# Patient Record
Sex: Female | Born: 1950 | Race: White | Hispanic: No | Marital: Married | Smoking: Never smoker
Health system: Southern US, Community
[De-identification: ages and names within clinical notes are randomized; demographics above are authoritative.]

## PROBLEM LIST (undated history)

## (undated) DIAGNOSIS — R739 Hyperglycemia, unspecified: Secondary | ICD-10-CM

## (undated) DIAGNOSIS — G20 Parkinson's disease: Secondary | ICD-10-CM

## (undated) DIAGNOSIS — L719 Rosacea, unspecified: Secondary | ICD-10-CM

## (undated) DIAGNOSIS — R87629 Unspecified abnormal cytological findings in specimens from vagina: Secondary | ICD-10-CM

## (undated) DIAGNOSIS — M79671 Pain in right foot: Secondary | ICD-10-CM

## (undated) DIAGNOSIS — E785 Hyperlipidemia, unspecified: Secondary | ICD-10-CM

## (undated) DIAGNOSIS — R1310 Dysphagia, unspecified: Secondary | ICD-10-CM

## (undated) DIAGNOSIS — R350 Frequency of micturition: Secondary | ICD-10-CM

## (undated) DIAGNOSIS — F418 Other specified anxiety disorders: Secondary | ICD-10-CM

## (undated) DIAGNOSIS — E669 Obesity, unspecified: Secondary | ICD-10-CM

## (undated) DIAGNOSIS — R61 Generalized hyperhidrosis: Secondary | ICD-10-CM

## (undated) DIAGNOSIS — K449 Diaphragmatic hernia without obstruction or gangrene: Secondary | ICD-10-CM

## (undated) DIAGNOSIS — H43399 Other vitreous opacities, unspecified eye: Secondary | ICD-10-CM

## (undated) DIAGNOSIS — M722 Plantar fascial fibromatosis: Secondary | ICD-10-CM

## (undated) DIAGNOSIS — M069 Rheumatoid arthritis, unspecified: Secondary | ICD-10-CM

## (undated) DIAGNOSIS — B059 Measles without complication: Secondary | ICD-10-CM

## (undated) DIAGNOSIS — N979 Female infertility, unspecified: Secondary | ICD-10-CM

## (undated) DIAGNOSIS — D649 Anemia, unspecified: Secondary | ICD-10-CM

## (undated) DIAGNOSIS — M545 Low back pain: Secondary | ICD-10-CM

## (undated) DIAGNOSIS — K222 Esophageal obstruction: Secondary | ICD-10-CM

## (undated) DIAGNOSIS — B019 Varicella without complication: Secondary | ICD-10-CM

## (undated) DIAGNOSIS — G20A1 Parkinson's disease without dyskinesia, without mention of fluctuations: Secondary | ICD-10-CM

## (undated) DIAGNOSIS — K5900 Constipation, unspecified: Secondary | ICD-10-CM

## (undated) DIAGNOSIS — M1990 Unspecified osteoarthritis, unspecified site: Secondary | ICD-10-CM

## (undated) DIAGNOSIS — F419 Anxiety disorder, unspecified: Secondary | ICD-10-CM

## (undated) DIAGNOSIS — L309 Dermatitis, unspecified: Secondary | ICD-10-CM

## (undated) DIAGNOSIS — K219 Gastro-esophageal reflux disease without esophagitis: Secondary | ICD-10-CM

## (undated) DIAGNOSIS — Z0000 Encounter for general adult medical examination without abnormal findings: Secondary | ICD-10-CM

## (undated) DIAGNOSIS — N390 Urinary tract infection, site not specified: Secondary | ICD-10-CM

## (undated) DIAGNOSIS — M79672 Pain in left foot: Secondary | ICD-10-CM

## (undated) DIAGNOSIS — C4491 Basal cell carcinoma of skin, unspecified: Secondary | ICD-10-CM

## (undated) DIAGNOSIS — T7840XA Allergy, unspecified, initial encounter: Secondary | ICD-10-CM

## (undated) DIAGNOSIS — I10 Essential (primary) hypertension: Secondary | ICD-10-CM

## (undated) DIAGNOSIS — D219 Benign neoplasm of connective and other soft tissue, unspecified: Secondary | ICD-10-CM

## (undated) DIAGNOSIS — J029 Acute pharyngitis, unspecified: Secondary | ICD-10-CM

## (undated) HISTORY — DX: Dermatitis, unspecified: L30.9

## (undated) HISTORY — DX: Basal cell carcinoma of skin, unspecified: C44.91

## (undated) HISTORY — DX: Pain in left foot: M79.672

## (undated) HISTORY — DX: Unspecified osteoarthritis, unspecified site: M19.90

## (undated) HISTORY — DX: Measles without complication: B05.9

## (undated) HISTORY — DX: Rheumatoid arthritis, unspecified: M06.9

## (undated) HISTORY — DX: Frequency of micturition: R35.0

## (undated) HISTORY — DX: Urinary tract infection, site not specified: N39.0

## (undated) HISTORY — DX: Anxiety disorder, unspecified: F41.9

## (undated) HISTORY — DX: Encounter for general adult medical examination without abnormal findings: Z00.00

## (undated) HISTORY — DX: Pain in right foot: M79.671

## (undated) HISTORY — PX: OTHER SURGICAL HISTORY: SHX169

## (undated) HISTORY — DX: Dysphagia, unspecified: R13.10

## (undated) HISTORY — DX: Gastro-esophageal reflux disease without esophagitis: K21.9

## (undated) HISTORY — DX: Varicella without complication: B01.9

## (undated) HISTORY — DX: Parkinson's disease: G20

## (undated) HISTORY — PX: HERNIA REPAIR: SHX51

## (undated) HISTORY — DX: Anemia, unspecified: D64.9

## (undated) HISTORY — DX: Unspecified abnormal cytological findings in specimens from vagina: R87.629

## (undated) HISTORY — DX: Other specified anxiety disorders: F41.8

## (undated) HISTORY — DX: Allergy, unspecified, initial encounter: T78.40XA

## (undated) HISTORY — PX: CARDIAC CATHETERIZATION: SHX172

## (undated) HISTORY — DX: Diaphragmatic hernia without obstruction or gangrene: K44.9

## (undated) HISTORY — DX: Low back pain: M54.5

## (undated) HISTORY — DX: Generalized hyperhidrosis: R61

## (undated) HISTORY — PX: JOINT REPLACEMENT: SHX530

## (undated) HISTORY — DX: Benign neoplasm of connective and other soft tissue, unspecified: D21.9

## (undated) HISTORY — DX: Rosacea, unspecified: L71.9

## (undated) HISTORY — DX: Constipation, unspecified: K59.00

## (undated) HISTORY — DX: Hyperglycemia, unspecified: R73.9

## (undated) HISTORY — DX: Acute pharyngitis, unspecified: J02.9

## (undated) HISTORY — DX: Parkinson's disease without dyskinesia, without mention of fluctuations: G20.A1

## (undated) HISTORY — DX: Other vitreous opacities, unspecified eye: H43.399

## (undated) HISTORY — DX: Essential (primary) hypertension: I10

## (undated) HISTORY — DX: Obesity, unspecified: E66.9

## (undated) HISTORY — PX: UPPER GASTROINTESTINAL ENDOSCOPY: SHX188

## (undated) HISTORY — DX: Plantar fascial fibromatosis: M72.2

## (undated) HISTORY — DX: Female infertility, unspecified: N97.9

## (undated) HISTORY — PX: TONSILLECTOMY: SUR1361

## (undated) HISTORY — DX: Hyperlipidemia, unspecified: E78.5

## (undated) HISTORY — DX: Esophageal obstruction: K22.2

---

## 1992-09-04 DIAGNOSIS — K222 Esophageal obstruction: Secondary | ICD-10-CM

## 1992-09-04 HISTORY — DX: Esophageal obstruction: K22.2

## 1993-06-07 ENCOUNTER — Encounter: Payer: Self-pay | Admitting: Gastroenterology

## 1999-09-05 LAB — HM COLONOSCOPY

## 2003-06-01 ENCOUNTER — Other Ambulatory Visit: Admission: RE | Admit: 2003-06-01 | Discharge: 2003-06-01 | Payer: Self-pay | Admitting: Obstetrics and Gynecology

## 2003-06-10 ENCOUNTER — Encounter: Admission: RE | Admit: 2003-06-10 | Discharge: 2003-06-10 | Payer: Self-pay | Admitting: Obstetrics and Gynecology

## 2003-06-10 ENCOUNTER — Encounter: Payer: Self-pay | Admitting: Obstetrics and Gynecology

## 2004-07-08 ENCOUNTER — Encounter: Admission: RE | Admit: 2004-07-08 | Discharge: 2004-07-08 | Payer: Self-pay | Admitting: Obstetrics and Gynecology

## 2004-07-14 ENCOUNTER — Ambulatory Visit: Payer: Self-pay | Admitting: Internal Medicine

## 2004-07-19 ENCOUNTER — Encounter: Admission: RE | Admit: 2004-07-19 | Discharge: 2004-07-19 | Payer: Self-pay | Admitting: Obstetrics and Gynecology

## 2004-08-18 ENCOUNTER — Ambulatory Visit: Payer: Self-pay | Admitting: Internal Medicine

## 2004-09-04 HISTORY — PX: ABDOMINAL HYSTERECTOMY: SHX81

## 2004-10-11 ENCOUNTER — Ambulatory Visit: Payer: Self-pay | Admitting: Internal Medicine

## 2004-11-08 ENCOUNTER — Ambulatory Visit: Payer: Self-pay | Admitting: Internal Medicine

## 2004-12-15 ENCOUNTER — Observation Stay (HOSPITAL_COMMUNITY): Admission: AD | Admit: 2004-12-15 | Discharge: 2004-12-16 | Payer: Self-pay | Admitting: Obstetrics and Gynecology

## 2004-12-15 ENCOUNTER — Encounter (INDEPENDENT_AMBULATORY_CARE_PROVIDER_SITE_OTHER): Payer: Self-pay | Admitting: *Deleted

## 2005-01-10 ENCOUNTER — Ambulatory Visit: Payer: Self-pay | Admitting: Internal Medicine

## 2005-03-09 ENCOUNTER — Ambulatory Visit: Payer: Self-pay | Admitting: Internal Medicine

## 2005-04-04 ENCOUNTER — Ambulatory Visit: Payer: Self-pay | Admitting: Internal Medicine

## 2005-04-10 ENCOUNTER — Ambulatory Visit: Payer: Self-pay | Admitting: Internal Medicine

## 2005-05-30 ENCOUNTER — Ambulatory Visit: Payer: Self-pay | Admitting: Internal Medicine

## 2005-06-06 ENCOUNTER — Ambulatory Visit: Payer: Self-pay | Admitting: Internal Medicine

## 2005-08-16 ENCOUNTER — Encounter: Admission: RE | Admit: 2005-08-16 | Discharge: 2005-08-16 | Payer: Self-pay | Admitting: Obstetrics and Gynecology

## 2005-10-10 ENCOUNTER — Ambulatory Visit: Payer: Self-pay | Admitting: Internal Medicine

## 2005-11-17 ENCOUNTER — Ambulatory Visit: Payer: Self-pay | Admitting: Internal Medicine

## 2005-12-22 ENCOUNTER — Ambulatory Visit: Payer: Self-pay | Admitting: Internal Medicine

## 2006-04-02 ENCOUNTER — Ambulatory Visit: Payer: Self-pay | Admitting: Internal Medicine

## 2006-04-25 ENCOUNTER — Ambulatory Visit: Payer: Self-pay | Admitting: Internal Medicine

## 2006-05-28 ENCOUNTER — Ambulatory Visit: Payer: Self-pay | Admitting: Internal Medicine

## 2006-06-04 ENCOUNTER — Ambulatory Visit: Payer: Self-pay | Admitting: Internal Medicine

## 2006-06-12 ENCOUNTER — Ambulatory Visit: Payer: Self-pay

## 2006-08-03 ENCOUNTER — Ambulatory Visit: Payer: Self-pay | Admitting: Internal Medicine

## 2007-01-14 ENCOUNTER — Ambulatory Visit: Payer: Self-pay | Admitting: Internal Medicine

## 2007-03-06 ENCOUNTER — Encounter: Admission: RE | Admit: 2007-03-06 | Discharge: 2007-03-06 | Payer: Self-pay | Admitting: Obstetrics and Gynecology

## 2007-03-12 ENCOUNTER — Ambulatory Visit: Payer: Self-pay | Admitting: Internal Medicine

## 2007-03-12 LAB — CONVERTED CEMR LAB
ALT: 38 units/L — ABNORMAL HIGH (ref 0–35)
AST: 31 units/L (ref 0–37)
Albumin: 3.9 g/dL (ref 3.5–5.2)
Alkaline Phosphatase: 77 units/L (ref 39–117)
Bilirubin, Direct: 0.1 mg/dL (ref 0.0–0.3)
Cholesterol: 261 mg/dL (ref 0–200)
Direct LDL: 197.5 mg/dL
HDL: 53.5 mg/dL (ref >39.0)
Total Bilirubin: 0.9 mg/dL (ref 0.3–1.2)
Total CHOL/HDL Ratio: 4.9
Total Protein: 6.6 g/dL (ref 6.0–8.3)
Triglycerides: 124 mg/dL (ref 0–149)
VLDL: 25 mg/dL (ref 0–40)

## 2007-03-22 ENCOUNTER — Ambulatory Visit: Payer: Self-pay | Admitting: Internal Medicine

## 2007-05-08 DIAGNOSIS — K219 Gastro-esophageal reflux disease without esophagitis: Secondary | ICD-10-CM | POA: Insufficient documentation

## 2007-05-08 DIAGNOSIS — Z862 Personal history of diseases of the blood and blood-forming organs and certain disorders involving the immune mechanism: Secondary | ICD-10-CM | POA: Insufficient documentation

## 2007-06-19 ENCOUNTER — Telehealth: Payer: Self-pay | Admitting: Internal Medicine

## 2007-06-26 ENCOUNTER — Ambulatory Visit: Payer: Self-pay | Admitting: Internal Medicine

## 2007-06-26 DIAGNOSIS — E785 Hyperlipidemia, unspecified: Secondary | ICD-10-CM | POA: Insufficient documentation

## 2007-06-26 LAB — CONVERTED CEMR LAB
ALT: 32 units/L (ref 0–35)
AST: 28 units/L (ref 0–37)
Albumin: 3.8 g/dL (ref 3.5–5.2)
Alkaline Phosphatase: 76 units/L (ref 39–117)
Bilirubin, Direct: 0.2 mg/dL (ref 0.0–0.3)
Cholesterol: 182 mg/dL (ref 0–200)
HDL: 46.8 mg/dL (ref >39.0)
LDL Cholesterol: 113 mg/dL — ABNORMAL HIGH (ref 0–99)
Total Bilirubin: 0.8 mg/dL (ref 0.3–1.2)
Total CHOL/HDL Ratio: 3.9
Total Protein: 6.5 g/dL (ref 6.0–8.3)
Triglycerides: 109 mg/dL (ref 0–149)
VLDL: 22 mg/dL (ref 0–40)

## 2007-07-05 ENCOUNTER — Ambulatory Visit: Payer: Self-pay | Admitting: Internal Medicine

## 2007-07-05 DIAGNOSIS — M1990 Unspecified osteoarthritis, unspecified site: Secondary | ICD-10-CM | POA: Insufficient documentation

## 2007-07-05 DIAGNOSIS — I10 Essential (primary) hypertension: Secondary | ICD-10-CM | POA: Insufficient documentation

## 2007-07-05 LAB — CONVERTED CEMR LAB
Cholesterol, target level: 200 mg/dL
HDL goal, serum: 40 mg/dL
LDL Goal: 160 mg/dL

## 2007-10-17 ENCOUNTER — Ambulatory Visit: Payer: Self-pay | Admitting: Internal Medicine

## 2007-10-17 ENCOUNTER — Telehealth: Payer: Self-pay | Admitting: Internal Medicine

## 2007-10-17 DIAGNOSIS — M5416 Radiculopathy, lumbar region: Secondary | ICD-10-CM | POA: Insufficient documentation

## 2007-10-17 DIAGNOSIS — M545 Low back pain, unspecified: Secondary | ICD-10-CM

## 2007-10-17 HISTORY — DX: Low back pain, unspecified: M54.50

## 2007-10-20 ENCOUNTER — Encounter: Admission: RE | Admit: 2007-10-20 | Discharge: 2007-10-20 | Payer: Self-pay | Admitting: Internal Medicine

## 2007-10-24 ENCOUNTER — Telehealth: Payer: Self-pay | Admitting: Internal Medicine

## 2007-10-26 ENCOUNTER — Encounter: Admission: RE | Admit: 2007-10-26 | Discharge: 2007-10-26 | Payer: Self-pay | Admitting: Internal Medicine

## 2007-10-30 ENCOUNTER — Telehealth: Payer: Self-pay | Admitting: *Deleted

## 2007-11-14 ENCOUNTER — Encounter: Payer: Self-pay | Admitting: Internal Medicine

## 2008-03-26 ENCOUNTER — Telehealth: Payer: Self-pay | Admitting: Internal Medicine

## 2008-03-31 ENCOUNTER — Telehealth: Payer: Self-pay | Admitting: Internal Medicine

## 2008-04-01 ENCOUNTER — Telehealth: Payer: Self-pay | Admitting: Internal Medicine

## 2008-04-29 ENCOUNTER — Encounter: Payer: Self-pay | Admitting: Internal Medicine

## 2008-06-25 ENCOUNTER — Ambulatory Visit: Payer: Self-pay | Admitting: Internal Medicine

## 2008-06-25 DIAGNOSIS — N3000 Acute cystitis without hematuria: Secondary | ICD-10-CM | POA: Insufficient documentation

## 2008-06-25 LAB — CONVERTED CEMR LAB
Bilirubin Urine: NEGATIVE
Glucose, Urine, Semiquant: NEGATIVE
Ketones, urine, test strip: NEGATIVE
Nitrite: NEGATIVE
Specific Gravity, Urine: 1.025
Urobilinogen, UA: 0.2
pH: 5

## 2008-07-16 ENCOUNTER — Ambulatory Visit: Payer: Self-pay | Admitting: Internal Medicine

## 2008-07-16 LAB — CONVERTED CEMR LAB
ALT: 27 units/L (ref 0–35)
AST: 26 units/L (ref 0–37)
Albumin: 4 g/dL (ref 3.5–5.2)
Alkaline Phosphatase: 72 units/L (ref 39–117)
BUN: 13 mg/dL (ref 6–23)
Bilirubin, Direct: 0.1 mg/dL (ref 0.0–0.3)
CO2: 30 meq/L (ref 19–32)
Calcium: 9.7 mg/dL (ref 8.4–10.5)
Chloride: 103 meq/L (ref 96–112)
Cholesterol: 247 mg/dL (ref 0–200)
Creatinine, Ser: 0.6 mg/dL (ref 0.4–1.2)
Direct LDL: 163.2 mg/dL
GFR calc Af Amer: 132 mL/min
GFR calc non Af Amer: 109 mL/min
Glucose, Bld: 93 mg/dL (ref 70–99)
HDL: 46.3 mg/dL (ref >39.0)
Potassium: 4.5 meq/L (ref 3.5–5.1)
Sodium: 141 meq/L (ref 135–145)
Total Bilirubin: 0.9 mg/dL (ref 0.3–1.2)
Total CHOL/HDL Ratio: 5.3
Total Protein: 6.5 g/dL (ref 6.0–8.3)
Triglycerides: 126 mg/dL (ref 0–149)
VLDL: 25 mg/dL (ref 0–40)

## 2008-08-06 ENCOUNTER — Ambulatory Visit: Payer: Self-pay | Admitting: Internal Medicine

## 2008-09-04 HISTORY — PX: TOTAL HIP ARTHROPLASTY: SHX124

## 2008-09-04 HISTORY — PX: PARTIAL HIP ARTHROPLASTY: SHX733

## 2008-10-07 ENCOUNTER — Ambulatory Visit: Payer: Self-pay | Admitting: Internal Medicine

## 2008-10-07 LAB — CONVERTED CEMR LAB
ALT: 23 units/L (ref 0–35)
AST: 23 units/L (ref 0–37)
Albumin: 3.8 g/dL (ref 3.5–5.2)
Alkaline Phosphatase: 72 units/L (ref 39–117)
Bilirubin, Direct: 0.1 mg/dL (ref 0.0–0.3)
Cholesterol: 235 mg/dL (ref 0–200)
Direct LDL: 161.5 mg/dL
HDL: 56.6 mg/dL (ref >39.0)
Total Bilirubin: 0.6 mg/dL (ref 0.3–1.2)
Total CHOL/HDL Ratio: 4.2
Total Protein: 6.7 g/dL (ref 6.0–8.3)
Triglycerides: 137 mg/dL (ref 0–149)
VLDL: 27 mg/dL (ref 0–40)

## 2008-11-03 ENCOUNTER — Ambulatory Visit: Payer: Self-pay | Admitting: Internal Medicine

## 2008-11-03 DIAGNOSIS — R1312 Dysphagia, oropharyngeal phase: Secondary | ICD-10-CM | POA: Insufficient documentation

## 2008-11-25 ENCOUNTER — Ambulatory Visit: Payer: Self-pay | Admitting: Gastroenterology

## 2008-11-25 DIAGNOSIS — R1314 Dysphagia, pharyngoesophageal phase: Secondary | ICD-10-CM | POA: Insufficient documentation

## 2008-12-03 ENCOUNTER — Ambulatory Visit: Payer: Self-pay | Admitting: Internal Medicine

## 2008-12-03 DIAGNOSIS — N390 Urinary tract infection, site not specified: Secondary | ICD-10-CM | POA: Insufficient documentation

## 2008-12-17 ENCOUNTER — Ambulatory Visit: Payer: Self-pay | Admitting: Internal Medicine

## 2008-12-17 LAB — CONVERTED CEMR LAB
ALT: 30 units/L (ref 0–35)
AST: 29 units/L (ref 0–37)
Albumin: 4 g/dL (ref 3.5–5.2)
Alkaline Phosphatase: 69 units/L (ref 39–117)
Bilirubin, Direct: 0.1 mg/dL (ref 0.0–0.3)
Cholesterol: 192 mg/dL (ref 0–200)
HDL: 56 mg/dL (ref >39.00)
LDL Cholesterol: 118 mg/dL — ABNORMAL HIGH (ref 0–99)
Total Bilirubin: 0.9 mg/dL (ref 0.3–1.2)
Total CHOL/HDL Ratio: 3
Total Protein: 6.8 g/dL (ref 6.0–8.3)
Triglycerides: 92 mg/dL (ref 0.0–149.0)
VLDL: 18.4 mg/dL (ref 0.0–40.0)

## 2008-12-25 ENCOUNTER — Ambulatory Visit (HOSPITAL_COMMUNITY): Admission: RE | Admit: 2008-12-25 | Discharge: 2008-12-25 | Payer: Self-pay | Admitting: Gastroenterology

## 2008-12-25 ENCOUNTER — Encounter: Payer: Self-pay | Admitting: Gastroenterology

## 2009-01-01 ENCOUNTER — Ambulatory Visit: Payer: Self-pay | Admitting: Internal Medicine

## 2009-01-15 ENCOUNTER — Inpatient Hospital Stay (HOSPITAL_COMMUNITY): Admission: RE | Admit: 2009-01-15 | Discharge: 2009-01-19 | Payer: Self-pay | Admitting: Orthopedic Surgery

## 2009-02-03 ENCOUNTER — Telehealth (INDEPENDENT_AMBULATORY_CARE_PROVIDER_SITE_OTHER): Payer: Self-pay | Admitting: *Deleted

## 2009-02-22 ENCOUNTER — Telehealth: Payer: Self-pay | Admitting: Internal Medicine

## 2009-03-09 ENCOUNTER — Encounter: Admission: RE | Admit: 2009-03-09 | Discharge: 2009-03-09 | Payer: Self-pay | Admitting: Obstetrics and Gynecology

## 2009-06-01 ENCOUNTER — Ambulatory Visit: Payer: Self-pay | Admitting: Internal Medicine

## 2009-06-02 ENCOUNTER — Encounter: Payer: Self-pay | Admitting: Internal Medicine

## 2009-06-08 ENCOUNTER — Telehealth (INDEPENDENT_AMBULATORY_CARE_PROVIDER_SITE_OTHER): Payer: Self-pay | Admitting: *Deleted

## 2009-06-24 ENCOUNTER — Ambulatory Visit: Payer: Self-pay | Admitting: Internal Medicine

## 2009-06-24 LAB — CONVERTED CEMR LAB
ALT: 23 units/L (ref 0–35)
AST: 22 units/L (ref 0–37)
Albumin: 4 g/dL (ref 3.5–5.2)
Alkaline Phosphatase: 67 units/L (ref 39–117)
Bilirubin, Direct: 0.1 mg/dL (ref 0.0–0.3)
Cholesterol: 183 mg/dL (ref 0–200)
HDL: 52.4 mg/dL (ref >39.00)
LDL Cholesterol: 114 mg/dL — ABNORMAL HIGH (ref 0–99)
Total Bilirubin: 0.8 mg/dL (ref 0.3–1.2)
Total CHOL/HDL Ratio: 3
Total Protein: 6 g/dL (ref 6.0–8.3)
Triglycerides: 84 mg/dL (ref 0.0–149.0)
VLDL: 16.8 mg/dL (ref 0.0–40.0)

## 2009-07-08 ENCOUNTER — Ambulatory Visit: Payer: Self-pay | Admitting: Internal Medicine

## 2009-11-02 ENCOUNTER — Ambulatory Visit: Payer: Self-pay | Admitting: Internal Medicine

## 2009-11-02 DIAGNOSIS — F418 Other specified anxiety disorders: Secondary | ICD-10-CM | POA: Insufficient documentation

## 2009-11-02 HISTORY — DX: Other specified anxiety disorders: F41.8

## 2009-11-11 ENCOUNTER — Encounter: Payer: Self-pay | Admitting: Internal Medicine

## 2009-11-12 DIAGNOSIS — E669 Obesity, unspecified: Secondary | ICD-10-CM | POA: Insufficient documentation

## 2009-12-07 ENCOUNTER — Ambulatory Visit: Payer: Self-pay | Admitting: Internal Medicine

## 2009-12-07 LAB — CONVERTED CEMR LAB
ALT: 22 units/L (ref 0–35)
AST: 21 units/L (ref 0–37)
Albumin: 4 g/dL (ref 3.5–5.2)
Alkaline Phosphatase: 75 units/L (ref 39–117)
Bilirubin, Direct: 0.1 mg/dL (ref 0.0–0.3)
Cholesterol: 176 mg/dL (ref 0–200)
HDL: 56.2 mg/dL (ref >39.00)
LDL Cholesterol: 95 mg/dL (ref 0–99)
Total Bilirubin: 0.7 mg/dL (ref 0.3–1.2)
Total CHOL/HDL Ratio: 3
Total Protein: 6.9 g/dL (ref 6.0–8.3)
Triglycerides: 125 mg/dL (ref 0.0–149.0)
VLDL: 25 mg/dL (ref 0.0–40.0)

## 2009-12-14 ENCOUNTER — Ambulatory Visit: Payer: Self-pay | Admitting: Internal Medicine

## 2010-01-07 ENCOUNTER — Encounter (INDEPENDENT_AMBULATORY_CARE_PROVIDER_SITE_OTHER): Payer: Self-pay | Admitting: *Deleted

## 2010-01-21 ENCOUNTER — Telehealth: Payer: Self-pay | Admitting: Internal Medicine

## 2010-01-21 ENCOUNTER — Encounter: Payer: Self-pay | Admitting: Internal Medicine

## 2010-01-21 ENCOUNTER — Ambulatory Visit: Payer: Self-pay | Admitting: Family Medicine

## 2010-01-21 LAB — CONVERTED CEMR LAB
Bilirubin Urine: NEGATIVE
Glucose, Urine, Semiquant: 100
Nitrite: POSITIVE
Specific Gravity, Urine: 1.015
Urobilinogen, UA: 1
pH: 5.5

## 2010-01-26 ENCOUNTER — Encounter: Payer: Self-pay | Admitting: Internal Medicine

## 2010-01-27 ENCOUNTER — Telehealth: Payer: Self-pay | Admitting: *Deleted

## 2010-01-27 DIAGNOSIS — F909 Attention-deficit hyperactivity disorder, unspecified type: Secondary | ICD-10-CM | POA: Insufficient documentation

## 2010-02-01 ENCOUNTER — Encounter: Payer: Self-pay | Admitting: Internal Medicine

## 2010-02-18 ENCOUNTER — Ambulatory Visit: Payer: Self-pay | Admitting: Internal Medicine

## 2010-02-21 ENCOUNTER — Telehealth (INDEPENDENT_AMBULATORY_CARE_PROVIDER_SITE_OTHER): Payer: Self-pay

## 2010-03-16 ENCOUNTER — Encounter: Payer: Self-pay | Admitting: Internal Medicine

## 2010-06-17 ENCOUNTER — Telehealth: Payer: Self-pay | Admitting: Internal Medicine

## 2010-09-19 ENCOUNTER — Encounter
Admission: RE | Admit: 2010-09-19 | Discharge: 2010-09-19 | Payer: Self-pay | Source: Home / Self Care | Attending: Obstetrics and Gynecology | Admitting: Obstetrics and Gynecology

## 2010-09-25 ENCOUNTER — Encounter: Payer: Self-pay | Admitting: Gastroenterology

## 2010-09-27 ENCOUNTER — Other Ambulatory Visit: Payer: Self-pay | Admitting: Obstetrics and Gynecology

## 2010-09-28 ENCOUNTER — Ambulatory Visit: Payer: Self-pay | Admitting: Genetic Counselor

## 2010-09-29 ENCOUNTER — Ambulatory Visit
Admission: RE | Admit: 2010-09-29 | Discharge: 2010-09-29 | Payer: Self-pay | Source: Home / Self Care | Attending: Internal Medicine | Admitting: Internal Medicine

## 2010-09-30 ENCOUNTER — Other Ambulatory Visit: Payer: Self-pay

## 2010-10-04 NOTE — Progress Notes (Signed)
Summary: Rx request  Phone Note Call from Patient   Reason for Call: Acute Illness Summary of Call: Patient had a hip replacement about a month ago and she has not been able to sleep. Patient states she finally went to sleep at 6:30 am this morning and had to get up at 9:00 am. Patient is requesting a sleep aid to be sent to the pharmacy. [facility]. Patient can be reached at [phone]. Initial call taken by: Darra Lis RMA,  February 22, 2009 2:10 PM  Follow-up for Phone Call        per dr Lovell Sheehan may have zolpidem 5  as needed 1 at bedtime  Follow-up by: Willy Eddy, LPN,  February 22, 2009 3:12 PM    New/Updated Medications: ZOLPIDEM TARTRATE 5 MG TABS (ZOLPIDEM TARTRATE) one by mouth q hs as needed insomnia.   Prescriptions: ZOLPIDEM TARTRATE 5 MG TABS (ZOLPIDEM TARTRATE) one by mouth q hs as needed insomnia.  #30 x 0   Entered by:   Lynann Beaver CMA   Authorized by:   Stacie Glaze MD   Signed by:   Lynann Beaver CMA on 02/22/2009   Method used:   Telephoned to ...       [facility]  [address] [phone]* (retail)       [address]       [place], [place]  [address]       Ph: [phone] or [phone]       Fax: [phone]   RxID:   [id number]

## 2010-10-04 NOTE — Assessment & Plan Note (Signed)
Summary: UTI/dm   Vital Signs:  Patient profile:   60 year old female Temp:     98.6 degrees F oral Pulse rate:   80 / minute BP sitting:   130 / 80  (left arm)  Vitals Entered By: Willy Eddy, LPN (June 01, 2009 10:55 AM) CC: uti like sx, Hypertension Management Is Patient Diabetic? No   Primary Care Provider:  Darryll Capers, MD   History of Present Illness: Had hip replacment and has returned to work has recurrent bladder infection two visits to urgent care as well as using her husbands cipro for one!!!  Hypertension History:      She denies headache, chest pain, palpitations, dyspnea with exertion, orthopnea, PND, peripheral edema, visual symptoms, neurologic problems, syncope, and side effects from treatment.        Positive major cardiovascular risk factors include female age 62 years old or older, hyperlipidemia, and hypertension.  Negative major cardiovascular risk factors include no history of diabetes, negative family history for ischemic heart disease, and non-tobacco-user status.        Further assessment for target organ damage reveals no history of ASHD, stroke/TIA, or peripheral vascular disease.     Problems Prior to Update: 1)  Preoperative Examination  (ICD-V72.84) 2)  Uti  (ICD-599.0) 3)  Dysphagia Pharyngoesophageal Phase  (ZOX-096.04) 4)  Dysphagia Oropharyngeal Phase  (VWU-981.19) 5)  Acute Cystitis  (ICD-595.0) 6)  Lumbar Radiculopathy, Right  (ICD-724.4) 7)  Osteoarthritis  (ICD-715.90) 8)  Hypertension  (ICD-401.9) 9)  Hyperlipidemia  (ICD-272.4) 10)  Family History Depression  (ICD-V17.0) 11)  Family History Breast Cancer 1st Degree Relative <50  (ICD-V16.3) 12)  Gerd  (ICD-530.81) 13)  Anemia-nos  (ICD-285.9)  Medications Prior to Update: 1)  Micardis Hct 80-25 Mg Tabs (Telmisartan-Hctz) .... 1/2 Once Daily 2)  Cymbalta 60 Mg  Cpep (Duloxetine Hcl) .... Once Daily 3)  Crestor 10 Mg Tabs (Rosuvastatin Calcium) .... Take 10 Mg Mwf  Every Week 4)  Omeprazole 20 Mg Tbec (Omeprazole) .... 2 Once Daily 5)  Vicodin 5-500 Mg Tabs (Hydrocodone-Acetaminophen) .Marland Kitchen.. 1 Three Times A Day 6)  Cipro 500 Mg Tabs (Ciprofloxacin Hcl) .... One Tablet Two Times A Day For 5 Days 7)  Diflucan 100 Mg Tabs (Fluconazole) .... One Tablet Once Daily For 5 Days 8)  Zolpidem Tartrate 5 Mg Tabs (Zolpidem Tartrate) .... One By Mouth Q Hs As Needed Insomnia.  Current Medications (verified): 1)  Micardis Hct 80-25 Mg Tabs (Telmisartan-Hctz) .... 1/2 Once Daily 2)  Cymbalta 60 Mg  Cpep (Duloxetine Hcl) .... Once Daily 3)  Crestor 10 Mg Tabs (Rosuvastatin Calcium) .... Take 10 Mg Mwf Every Week 4)  Omeprazole 20 Mg Tbec (Omeprazole) .... 2 Once Daily 5)  Vicodin 5-500 Mg Tabs (Hydrocodone-Acetaminophen) .Marland Kitchen.. 1 Three Times A Day 6)  Cipro 500 Mg Tabs (Ciprofloxacin Hcl) .... One Tablet Two Times A Day For 5 Days 7)  Diflucan 100 Mg Tabs (Fluconazole) .... One Tablet Once Daily For 5 Days 8)  Zolpidem Tartrate 5 Mg Tabs (Zolpidem Tartrate) .... One By Mouth Q Hs As Needed Insomnia.  Allergies (verified): 1)  ! Pcn  Past History:  Family History: Last updated: 11/25/2008 Family History of Arthritis Family History Breast cancer 1st degree relative <50 Mother Family History Depression Family History Hypertension Family History Psychiatric care sister with bilateral THR Family History of Heart Disease: Father  Social History: Last updated: 11/25/2008 Occupation:Teacher Married Never Smoked Alcohol use-yes Daily Caffeine Use -1 Illicit Drug Use -  no  Risk Factors: Alcohol Use: <1 (01/01/2009)  Risk Factors: Smoking Status: never (01/01/2009)  Past medical, surgical, family and social histories (including risk factors) reviewed, and no changes noted (except as noted below).  Past Medical History: Reviewed history from 11/25/2008 and no changes required. Uterine Fibroids Abn. Pap, hx  of Anemia GERD Hyperlipidemia Hypertension Osteoarthritis Anxiety Disorder Obesity Hiatal hernia Esophageal stricture 1994  Past Surgical History: Reviewed history from 11/25/2008 and no changes required. Wisdom Teeth Extracted Laporoscopy Tonsillectomy Hysterectomy  Family History: Reviewed history from 11/25/2008 and no changes required. Family History of Arthritis Family History Breast cancer 1st degree relative <50 Mother Family History Depression Family History Hypertension Family History Psychiatric care sister with bilateral THR Family History of Heart Disease: Father  Social History: Reviewed history from 11/25/2008 and no changes required. 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    Physical Exam  General:  overweight-appearing.  no distress Head:  Normocephalic and atraumatic. Eyes:  PERRLA, no icterus. Ears:  Normal auditory acuity.R ear normal and L ear normal.   Nose:  no external deformity and no nasal discharge.   Neck:  Supple; no masses or thyromegaly. Lungs:  Clear throughout to auscultation. Heart:  Regular rate and rhythm; no murmurs, rubs,  or bruits. Abdomen:  soft and non-tender.   Msk:  Symmetrical with no gross deformities. Normal posture. Pulses:  Normal pulses noted. Extremities:  trace left pedal edema and trace right pedal edema.   Neurologic:  cranial nerves  II-XII intact and abnormal gait.     Impression & Recommendations:  Problem # 1:  ACUTE CYSTITIS (ICD-595.0) need to ocnsider urology referral for possible intersitial dz The following medications were removed from the medication list:    Cipro 500 Mg Tabs (Ciprofloxacin hcl) ..... One tablet two times a day for 5 days Her updated medication list for this problem includes:    Levaquin 500 Mg Tabs (Levofloxacin) ..... One by mouth daily for 5 days  Encouraged to push clear liquids, get enough rest, and take acetaminophen as needed. To be seen in 10 days if no improvement, sooner if worse.  Orders: T-Culture, Urine ([id number])  Problem # 2:  HYPERLIPIDEMIA (ICD-272.4) Assessment: Unchanged  Her updated medication list for this problem includes:    Crestor 10 Mg Tabs (Rosuvastatin calcium) .Marland Kitchen... Take 10 mg mwf every week  Labs Reviewed: SGOT: 29 (12/17/2008)   SGPT: 30 (12/17/2008)  Lipid Goals: Chol Goal: 200 (07/05/2007)   HDL Goal: 40 (07/05/2007)   LDL Goal: 160 (07/05/2007)   TG Goal: 150 (07/05/2007)  Prior 10 Yr Risk Heart Disease: 8 % (01/01/2009)   HDL:56.00 (12/17/2008), 56.6 (10/07/2008)  LDL:118 (12/17/2008), DEL (10/07/2008)  Chol:192 (12/17/2008), 235 (10/07/2008)  Trig:92.0 (12/17/2008), 137 (10/07/2008)  Problem # 3:  HYPERTENSION (ICD-401.9) Assessment: Improved  Her updated medication list for this problem includes:    Micardis Hct 80-25 Mg Tabs (Telmisartan-hctz) .Marland Kitchen... 1/2 once daily  BP today: 130/80 Prior BP: 130/80 (01/01/2009)  Prior 10 Yr Risk Heart Disease: 8 % (01/01/2009)  Labs Reviewed: K+: 4.5 (07/16/2008) Creat: : 0.6 (07/16/2008)  Chol: 192 (12/17/2008)   HDL: 56.00 (12/17/2008)   LDL: 118 (12/17/2008)   TG: 92.0 (12/17/2008)  Complete Medication List: 1)  Micardis Hct 80-25 Mg Tabs (Telmisartan-hctz) .... 1/2 once daily 2)  Cymbalta 60 Mg Cpep (Duloxetine hcl) .... Once daily 3)  Crestor 10 Mg Tabs (Rosuvastatin calcium) .... Take 10  mg mwf every week 4)  Omeprazole 20 Mg Tbec (Omeprazole) .... 2 once daily 5)  Vicodin 5-500 Mg Tabs (Hydrocodone-acetaminophen) .Marland Kitchen.. 1 three times a day 6)  Diflucan 100 Mg Tabs (Fluconazole) .... One tablet once daily for 5 days 7)  Zolpidem Tartrate 5 Mg Tabs (Zolpidem tartrate) .... One by mouth q hs as needed insomnia. 8)  Levaquin 500 Mg Tabs (Levofloxacin) .... One by mouth daily for 5 days  Other Orders: UA Dipstick W/ Micro (manual) ([address])  Hypertension Assessment/Plan:      The patient's hypertensive risk group is category B: At least one risk factor (excluding diabetes) with no target organ damage.  Her calculated 10 year risk of coronary heart disease is 8 %.  Today's blood pressure is 130/80.  Her blood pressure goal is < 140/90.  Patient Instructions: 1)  lipid follow up 2)  Hepatic Panel prior to visit ICD-9: 995.20 3)  Lipid panel prior to visit ICD-9 : 272.4  Appended Document: UTI/dm  Laboratory Results   Urine Tests    Routine Urinalysis   Color: yellow Appearance: Clear Glucose: negative   (Normal Range: Negative) Bilirubin: negative   (Normal Range: Negative) Ketone: negative   (Normal Range: Negative) Spec. Gravity: 1.020   (Normal Range: 1.003-1.035) Blood: trace-intact   (Normal Range: Negative) pH: 7.0   (Normal Range: 5.0-8.0) Protein: negative   (Normal Range: Negative) Urobilinogen: 0.2   (Normal Range: 0-1) Nitrite: negative   (Normal Range: Negative) Leukocyte Esterace: negative   (Normal Range: Negative)    Comments: Joanne Chars CMA  June 01, 2009 12:33 PM

## 2010-10-04 NOTE — Assessment & Plan Note (Signed)
Summary: ? uti---ok per judy//ccm   Vital Signs:  Patient profile:   60 year old female Temp:     98.6 degrees F oral BP sitting:   110 / 84  (left arm) Cuff size:   large  Vitals Entered By: Raechel Ache, RN (Jan 21, 2010 4:56 PM) CC: C/o urgency, dysuria, pressure. Took AZO today.   History of Present Illness: Here as a walk-in for a probable UTI. She has had these frequently over the years, and she is familiar with the typical symptoms. Today at work she had the onset of pressure to urinate and urgency, along with burning on urination. No nausea or fever or back pain. She is drinking lots of water and usinf Azo Standard. She had a full workup last fall per Dr. Larey Dresser, and no obstructions or other problems were found. he suggested getting her on prophylactic antibitoics, and she started taking Trimethaprim daily. She did well, but admits to stopping this about 3 months ago.   Allergies: 1)  ! Pcn  Past History:  Past Medical History: Uterine Fibroids Abn. Pap, hx of Anemia GERD Hyperlipidemia Hypertension Osteoarthritis Anxiety Disorder Obesity Hiatal hernia Esophageal stricture 1994 frequent UTIs, sees Dr. Vonita Moss  Past Surgical History: Reviewed history from 11/25/2008 and no changes required. Wisdom Teeth Extracted Laporoscopy Tonsillectomy Hysterectomy  Review of Systems  The patient denies anorexia, fever, weight loss, weight gain, vision loss, decreased hearing, hoarseness, chest pain, syncope, dyspnea on exertion, peripheral edema, prolonged cough, headaches, hemoptysis, abdominal pain, melena, hematochezia, severe indigestion/heartburn, hematuria, incontinence, genital sores, muscle weakness, suspicious skin lesions, transient blindness, difficulty walking, depression, unusual weight change, abnormal bleeding, enlarged lymph nodes, angioedema, breast masses, and testicular masses.    Physical Exam  General:  Well-developed,well-nourished,in no  acute distress; alert,appropriate and cooperative throughout examination Abdomen:  Bowel sounds positive,abdomen soft and non-tender without masses, organomegaly or hernias noted.   Impression & Recommendations:  Problem # 1:  UTI (ICD-599.0)  Her updated medication list for this problem includes:    Trimethoprim 100 Mg Tabs (Trimethoprim) ..... Once daily    Levaquin 500 Mg Tabs (Levofloxacin) ..... Once daily  Orders: UA Dipstick w/o Micro (automated)  (81003) T-Culture, Urine ([id number]) Specimen Handling (95621)  Complete Medication List: 1)  Micardis Hct 80-25 Mg Tabs (Telmisartan-hctz) .... 1/2 once daily 2)  Cymbalta 60 Mg Cpep (Duloxetine hcl) .... Once daily 3)  Crestor 10 Mg Tabs (Rosuvastatin calcium) .... Take 10 mg mwf every week 4)  Omeprazole 40 Mg Cpdr (Omeprazole) .Marland Kitchen.. 1 once daily 5)  Aspir-low 81 Mg Tbec (Aspirin) .Marland Kitchen.. 1 once daily 6)  Amphetamine-dextroamphetamine 20 Mg Xr24h-cap (Amphetamine-dextroamphetamine) .... One by mouth daily 7)  Trimethoprim 100 Mg Tabs (Trimethoprim) .... Once daily 8)  Levaquin 500 Mg Tabs (Levofloxacin) .... Once daily  Patient Instructions: 1)  Treat with Levaquin for 7 days, then go back on daily prophylaxis with Trimethaprim. Urine culture is pending.  Prescriptions: LEVAQUIN 500 MG TABS (LEVOFLOXACIN) once daily  #7 x 0   Entered and Authorized by:   Nelwyn Salisbury MD   Signed by:   Nelwyn Salisbury MD on 01/21/2010   Method used:   Electronically to        [facility] [address] [phone]* (retail)       [address]       [place], [place]  [address]       Ph: [phone] or [phone]  Fax: [phone]   RxID:   [id number] TRIMETHOPRIM 100 MG TABS (TRIMETHOPRIM) once daily  #30 x 5   Entered and Authorized by:   Nelwyn Salisbury MD   Signed by:   Nelwyn Salisbury MD on 01/21/2010   Method used:   Electronically to        [facility] [address] [phone]* (retail)       [address]       [place], [place]  [address]       Ph: [phone] or [phone]       Fax: [phone]   RxID:   [id number]   Laboratory Results   Urine Tests  Date/Time Recieved: Jan 21, 2010 4:48 PM  Date/Time Reported: Jan 21, 2010 4:48 PM   Routine Urinalysis   Color: orange Appearance: Clear Glucose: 100   (Normal Range: Negative) Bilirubin: negative   (Normal Range: Negative) Ketone: trace (5)   (Normal Range: Negative) Spec. Gravity: 1.015   (Normal Range: 1.003-1.035) Blood: trace-intact   (Normal Range: Negative) pH: 5.5   (Normal Range: 5.0-8.0) Protein: 1+   (Normal Range: Negative) Urobilinogen: 1.0   (Normal Range: 0-1) Nitrite: positive   (Normal Range: Negative) Leukocyte Esterace: 3+   (Normal Range: Negative)    Comments: Wynona Canes, CMA  Jan 21, 2010 4:48 PM

## 2010-10-04 NOTE — Assessment & Plan Note (Signed)
Summary: dysphagia,,e,   History of Present Illness Visit Type: Initial Consult Primary GI MD: Elie Goody MD [facility] Primary Provider: Darryll Capers, MD Requesting Provider: Darryll Capers, MD Chief Complaint: dysphagia History of Present Illness:   This is a 60 year old white female who I saw previously for GERD with peptic stricture. She underwent upper endoscopy with [facility] dilation in October of 1994. A moderately large sliding hiatal hernia was noted on examination. She states she has remained on Prilosec since that time. Over the past 6 months she has had intermittent episodes of choking while swallowing liquids. She states "B. drinks went down the wrong pipe". She felt she could not breathe. She has no solid food dysphasia or reflux symptoms are under very good control. She is recently changed to Kapidex from omeprazole.   GI Review of Systems    Reports acid reflux and  dysphagia with liquids.      Denies abdominal pain, belching, bloating, chest pain, dysphagia with solids, heartburn, loss of appetite, nausea, vomiting, vomiting blood, weight loss, and  weight gain.      Reports constipation.     Denies anal fissure, black tarry stools, change in bowel habit, diarrhea, diverticulosis, fecal incontinence, heme positive stool, hemorrhoids, irritable bowel syndrome, jaundice, light color stool, liver problems, rectal bleeding, and  rectal pain. Preventive Screening-Counseling & Management      Drug Use:  no.    Current Medications (verified): 1)  Micardis Hct 80-25 Mg Tabs (Telmisartan-Hctz) .... 1/2 Once Daily 2)  Cymbalta 60 Mg  Cpep (Duloxetine Hcl) .... Once Daily 3)  Aleve 220 Mg Caps (Naproxen Sodium) .... 4 Tablets Daily 4)  Kapidex 60 Mg Cpdr (Dexlansoprazole) .... One By Mouth Daily 5)  Crestor 10 Mg Tabs (Rosuvastatin Calcium) .... Take 10 Mg Mwf Every Week  Allergies (verified): 1)  ! Pcn  Past History:  Past Medical History:    Uterine Fibroids    Abn. Pap, hx  of    Anemia    GERD    Hyperlipidemia    Hypertension    Osteoarthritis    Anxiety Disorder    Obesity    Hiatal hernia    Esophageal stricture 1994  Past Surgical History:    Wisdom Teeth Extracted    Laporoscopy    Tonsillectomy    Hysterectomy  Family History:    Family History of Arthritis    Family History Breast cancer 1st degree relative <50 Mother    Family History Depression    Family History Hypertension    Family History Psychiatric care    sister with bilateral THR    Family History of Heart Disease: Father  Social History:    Occupation:Teacher    Married    Never Smoked    Alcohol use-yes    Daily Caffeine Use -1    Illicit Drug Use - no    Drug Use:  no  Review of Systems       The patient complains of arthritis/joint pain, back pain, night sweats, nosebleeds, and sleeping problems.         The pertinent positives and negatives are noted as above and in the HPI. All other ROS were negative.   Vital Signs:  Patient profile:   60 year old female Height:      65 inches Weight:      231.38 pounds BMI:     38.64 Pulse rate:   80 / minute Pulse rhythm:   regular BP sitting:   110 /  72  (left arm) Cuff size:   large  Vitals Entered By: June McMurray CMA (November 25, 2008 2:44 PM)  Physical Exam  General:  Well developed, well nourished, no acute distress. Head:  Normocephalic and atraumatic. Eyes:  PERRLA, no icterus. Ears:  Normal auditory acuity. Mouth:  No deformity or lesions, dentition normal. Neck:  Supple; no masses or thyromegaly. Lungs:  Clear throughout to auscultation. Heart:  Regular rate and rhythm; no murmurs, rubs,  or bruits. Abdomen:  Soft, nontender and nondistended. No masses, hepatosplenomegaly or hernias noted. Normal bowel sounds. Msk:  Symmetrical with no gross deformities. Normal posture. Pulses:  Normal pulses noted. Extremities:  No clubbing, cyanosis, edema or deformities noted. Neurologic:  Alert and  oriented x4;   grossly normal neurologically. Skin:  Intact without significant lesions or rashes. Cervical Nodes:  No significant cervical adenopathy. Inguinal Nodes:  No significant inguinal adenopathy. Psych:  Alert and cooperative. Normal mood and affect.   Impression & Recommendations:  Problem # 1:  DYSPHAGIA OROPHARYNGEAL PHASE (ICD-787.22) Her symptoms strongly suggest aspiration of liquids. Rule out underlying neuromuscular disorder. Orders: Barium Swallow, Modified  (Modified BS)  Problem # 2:  GERD (ICD-530.81) Her GERD appears to be well controlled. She will return to omeprazole 20 mg daily after completing a course of Kapidex. If the modified barium swallow is unremarkable and her symptoms persist we'll plan to proceed with upper endoscopy for further evaluation to exclude esophageal pathology.  Patient Instructions: 1)  You have been scheduled for a Barium Swallow with speech pathology.  2)  Please continue current medications.  3)  Copy sent to : Darryll Capers, MD 4)  The medication list was reviewed and reconciled.  All changed / newly prescribed medications were explained.  A complete medication list was provided to the patient / caregiver.

## 2010-10-04 NOTE — Progress Notes (Signed)
Summary: something to calm her during MRI  pt called again   Phone Note Call from Patient Call back at [phone]   Caller: patient triage message Call For: Kristin [name] Summary of Call: Hurt back saw Kristin [name] last Thursday.  MRI was scheduled for Sunday am.  She was afraid and could not do it.  She is rescheduled for this week in a bigger one.  The meds he gave her last time did not kick in soon enough.  Could he call her in something to calm her down  Initial call taken by: Roselle Locus,  October 24, 2007 3:09 PM  Follow-up for Phone Call        ativan 1mg   15 min prior Follow-up by: Stacie Glaze MD,  October 25, 2007 7:59 AM  Additional Follow-up for Phone Call Additional follow up Details #1::        patient is calling back looking for med for her test. [phone] cell [phone] ..................................................................Marland KitchenRoselle Locus  October 25, 2007 10:08 AM    Ativan 1 mg. called to [facility] Pt. notified.

## 2010-10-04 NOTE — Progress Notes (Signed)
Summary: UTI symptoms continue  Phone Note Call from Patient   Caller: Patient Call For: Stacie Glaze MD Reason for Call: Acute Illness Complaint: Urinary/GYN Problems Summary of Call: Pt is having incontinence, urgency, back and groin pain after finishing antibiotics. [facility] [phone] Initial call taken by: Lynann Beaver CMA,  June 08, 2009 12:29 PM  Follow-up for Phone Call        per dr Lovell Sheehan cipro 500 two times a day for 3 days and since signs persist needs to see urologist--order sent Follow-up by: Willy Eddy, LPN,  June 08, 2009 1:51 PM  Additional Follow-up for Phone Call Additional follow up Details #1::        Message left for patient on voice mail to pick up script and that [facility] will be contacting her with the date and time of her urology appointment. Additional Follow-up by: Darra Lis RMA,  June 08, 2009 2:14 PM    New/Updated Medications: CIPRO 500 MG TABS (CIPROFLOXACIN HCL) one tablet two times a day for 3 days. Prescriptions: CIPRO 500 MG TABS (CIPROFLOXACIN HCL) one tablet two times a day for 3 days.  #6 x 0   Entered by:   Darra Lis RMA   Authorized by:   Stacie Glaze MD   Signed by:   Darra Lis RMA on 06/08/2009   Method used:   Electronically to        [facility] [address] [phone]* (retail)       [address]       [place], [place]  [address]       Ph: [phone] or [phone]       Fax: [phone]   RxID:   [id number]

## 2010-10-04 NOTE — Medication Information (Signed)
Summary: Prior Authorization Request and Approval for Dextroamphetamine-A  Prior Authorization Request and Approval for Dextroamphetamine-Amph   Imported By: Maryln Gottron 02/04/2010 14:38:35  _____________________________________________________________________  External Attachment:    Type:   Image     Comment:   External Document

## 2010-10-04 NOTE — Assessment & Plan Note (Signed)
Summary: 6 wk rov/njr pt rsc/njr  doc rsc/njr   Vital Signs:  Patient profile:   60 year old female Height:      65 inches Weight:      228 pounds BMI:     38.08 Temp:     98.2 degrees F oral Pulse rate:   82 / minute Resp:     14 per minute BP sitting:   130 / 80  (left arm) Cuff size:   large  Vitals Entered By: Willy Eddy, LPN (January 01, 2009 2:13 PM)  Nutrition Counseling: Patient's BMI is greater than 25 and therefore counseled on weight management options.  Primary Care Provider:  Darryll Capers, MD  CC:  TOTAL HIP REPLACEMENT SURG IN 2 WEEKS --NEEDS MEDICAL CLEARANCE.  History of Present Illness: DUE TO INCREASED PAIN FORM HIP SHE CANNOT AMBULATE GREATER THAN 50 FEET WITHOUT STOPPING DUE TO PAIN HER RISKS FOR SURGERY ARE OBESITY, HTN, LIPIDS ( RISK OF PAD)  AND SLIGHT AGE RISK  SHE IS WELL CONTROLLED ON HER BLOOD PRESURE AGENTS  ( MICARDIS) AND HER LIPD AGENT (CRESTOR). SHE HAS NO ANGINA OR CHF SYMPTOMS, SHE DENIES ARRYTHMIA OR PALPITATIONS HER BMI IS 38 REQUESTING PHYSCIAN IS   ALLUSIO  Hypertension History:      Positive major cardiovascular risk factors include female age 54 years old or older, hyperlipidemia, and hypertension.  Negative major cardiovascular risk factors include no history of diabetes, negative family history for ischemic heart disease, and non-tobacco-user status.        Further assessment for target organ damage reveals no history of ASHD, stroke/TIA, or peripheral vascular disease.     Preventive Screening-Counseling & Management     Alcohol drinks/day: <1     Smoking Status: never  Current Medications (verified): 1)  Micardis Hct 80-25 Mg Tabs (Telmisartan-Hctz) .... 1/2 Once Daily 2)  Cymbalta 60 Mg  Cpep (Duloxetine Hcl) .... Once Daily 3)  Crestor 10 Mg Tabs (Rosuvastatin Calcium) .... Take 10 Mg Mwf Every Week 4)  Omeprazole 20 Mg Tbec (Omeprazole) .... 2 Once Daily 5)  Vicodin 5-500 Mg Tabs (Hydrocodone-Acetaminophen) .Marland Kitchen.. 1 Three  Times A Day  Allergies (verified): 1)  ! Pcn  Past History:  Family History:    Family History of Arthritis    Family History Breast cancer 1st degree relative <50 Mother    Family History Depression    Family History Hypertension    Family History Psychiatric care    sister with bilateral THR    Family History of Heart Disease: Father     (11/25/2008)  Social History:    Occupation:Teacher    Married    Never Smoked    Alcohol use-yes    Daily Caffeine Use -1    Illicit Drug Use - no     (11/25/2008)  Risk Factors:    Alcohol Use: N/A    >5 drinks/d w/in last 3 months: N/A    Caffeine Use: N/A    Diet: N/A    Exercise: N/A  Risk Factors:    Smoking Status: never (05/08/2007)    Packs/Day: N/A    Cigars/wk: N/A    Pipe Use/wk: N/A    Cans of tobacco/wk: N/A    Passive Smoke Exposure: N/A  Past medical, surgical, family and social histories (including risk factors) reviewed, and no changes noted (except as noted below).  Past Medical History:    Reviewed history from 11/25/2008 and no changes required:    Uterine Fibroids  Abn. Pap, hx of    Anemia    GERD    Hyperlipidemia    Hypertension    Osteoarthritis    Anxiety Disorder    Obesity    Hiatal hernia    Esophageal stricture 1994  Past Surgical History:    Reviewed history from 11/25/2008 and no changes required:    Wisdom Teeth Extracted    Laporoscopy    Tonsillectomy    Hysterectomy  Family History:    Reviewed history from 11/25/2008 and no changes required:       Family History of Arthritis       Family History Breast cancer 1st degree relative <50 Mother       Family History Depression       Family History Hypertension       Family History Psychiatric care       sister with bilateral THR       Family History of Heart Disease: Father  Social History:    Reviewed history from 11/25/2008 and no changes required:       Occupation:Teacher       Married       Never Smoked        Alcohol use-yes       Daily Caffeine Use -1       Illicit Drug Use - no  Review of Systems  The patient denies anorexia, fever, weight loss, weight gain, vision loss, decreased hearing, hoarseness, chest pain, syncope, dyspnea on exertion, peripheral edema, prolonged cough, headaches, hemoptysis, abdominal pain, melena, hematochezia, severe indigestion/heartburn, hematuria, incontinence, genital sores, muscle weakness, suspicious skin lesions, transient blindness, difficulty walking, depression, unusual weight change, abnormal bleeding, enlarged lymph nodes, angioedema, and breast masses.    Physical Exam  General:  overweight-appearing.  no distress Head:  Normocephalic and atraumatic. Eyes:  PERRLA, no icterus. Ears:  Normal auditory acuity.R ear normal and L ear normal.   Nose:  no external deformity and no nasal discharge.   Mouth:  No deformity or lesions, dentition normal. Neck:  Supple; no masses or thyromegaly. Chest Wall:  No deformities, masses, or tenderness noted. Lungs:  Clear throughout to auscultation. Heart:  Regular rate and rhythm; no murmurs, rubs,  or bruits. Abdomen:  soft and non-tender.   Extremities:  trace left pedal edema and trace right pedal edema.   Neurologic:  cranial nerves II-XII intact and abnormal gait.   Skin:  turgor normal and color normal.   Cervical Nodes:  No lymphadenopathy noted Axillary Nodes:  No palpable lymphadenopathy Psych:  Oriented X3 and good eye contact.     Impression & Recommendations:  Problem # 1:  PREOPERATIVE EXAMINATION (ICD-V72.84) HAS KNOWN BBB CLEAQRED FOR SURGERY Orders: EKG w/ Interpretation (93000)  Problem # 2:  HYPERLIPIDEMIA (ICD-272.4) PULSE THERAPY WITH GOOD RESULTS AND LESS SIDE EFFECTS Her updated medication list for this problem includes:    Crestor 10 Mg Tabs (Rosuvastatin calcium) .Marland Kitchen... Take 10 mg mwf every week  Labs Reviewed: SGOT: 29 (12/17/2008)   SGPT: 30 (12/17/2008)  Lipid Goals: Chol  Goal: 200 (07/05/2007)   HDL Goal: 40 (07/05/2007)   LDL Goal: 160 (07/05/2007)   TG Goal: 150 (07/05/2007)  10 Yr Risk Heart Disease: 8 % Prior 10 Yr Risk Heart Disease: 11 % (11/03/2008)   HDL:56.00 (12/17/2008), 56.6 (10/07/2008)  LDL:118 (12/17/2008), DEL (10/07/2008)  Chol:192 (12/17/2008), 235 (10/07/2008)  Trig:92.0 (12/17/2008), 137 (10/07/2008)  Problem # 3:  HYPERTENSION (ICD-401.9) STABLE HTN WITH NO SIGNS OF ANGINA OR CHF  Her updated medication list for this problem includes:    Micardis Hct 80-25 Mg Tabs (Telmisartan-hctz) .Marland Kitchen... 1/2 once daily  BP today: 130/80 Prior BP: 110/86 (12/03/2008)  10 Yr Risk Heart Disease: 8 % Prior 10 Yr Risk Heart Disease: 11 % (11/03/2008)  Labs Reviewed: K+: 4.5 (07/16/2008) Creat: : 0.6 (07/16/2008)   Chol: 192 (12/17/2008)   HDL: 56.00 (12/17/2008)   LDL: 118 (12/17/2008)   TG: 92.0 (12/17/2008)  Complete Medication List: 1)  Micardis Hct 80-25 Mg Tabs (Telmisartan-hctz) .... 1/2 once daily 2)  Cymbalta 60 Mg Cpep (Duloxetine hcl) .... Once daily 3)  Crestor 10 Mg Tabs (Rosuvastatin calcium) .... Take 10 mg mwf every week 4)  Omeprazole 20 Mg Tbec (Omeprazole) .... 2 once daily 5)  Vicodin 5-500 Mg Tabs (Hydrocodone-acetaminophen) .Marland Kitchen.. 1 three times a day  Hypertension Assessment/Plan:      The patient's hypertensive risk group is category B: At least one risk factor (excluding diabetes) with no target organ damage.  Her calculated 10 year risk of coronary heart disease is 8 %.  Today's blood pressure is 130/80.  Her blood pressure goal is < 140/90.  Patient Instructions: 1)  CLEARED FOR ORTHOPEDIC SURGERY COPY OF EKG AND RECENT LABS SENT VIA PT TO ORTHOPEDIST

## 2010-10-04 NOTE — Progress Notes (Signed)
Summary: back pain  Phone Note Call from Patient   Caller: Patient Call For: Dr. Lovell Sheehan Summary of Call: Pt calls complaining of back pain......thinks she may have re-injured it, and is having leg pain also.  Sees an Orthopedist for her disc herniations, so she will call that office for direction.  If she has problems, she will call back. [phone] Initial call taken by: Lynann Beaver CMA,  March 26, 2008 9:19 AM

## 2010-10-04 NOTE — Assessment & Plan Note (Signed)
Summary: 2 month rov/njr pt rsc/njr   Vital Signs:  Patient Profile:   60 Years Old Female Height:     66 inches Weight:      228 pounds Temp:     98.4 degrees F oral Pulse rate:   80 / minute Resp:     14 per minute BP sitting:   120 / 70  (left arm) Cuff size:   large  Vitals Entered By: Willy Eddy, LPN (November 03, 1476 12:06 PM)                 Chief Complaint:  roa-sees aluisio next week for painful hip.  History of Present Illness: Stopped the statin due to leg pain but the leg pain did not stop with the cesation of the statis. The orthopedis recommends THR!! Will need surgical clearance  Hypertension History:      She denies headache, chest pain, palpitations, dyspnea with exertion, orthopnea, PND, peripheral edema, visual symptoms, neurologic problems, syncope, and side effects from treatment.        Positive major cardiovascular risk factors include female age 13 years old or older, hyperlipidemia, and hypertension.  Negative major cardiovascular risk factors include no history of diabetes, negative family history for ischemic heart disease, and non-tobacco-user status.        Further assessment for target organ damage reveals no history of ASHD, stroke/TIA, or peripheral vascular disease.       Prior Medication List:  MICARDIS HCT 80-25 MG TABS (TELMISARTAN-HCTZ) 1/2 once daily PRILOSEC 20 MG CPDR (OMEPRAZOLE) 2 once daily CYMBALTA 60 MG  CPEP (DULOXETINE HCL) once daily ETODOLAC 300 MG CAPS (ETODOLAC) one by mouth BID FISH OIL CONCENTRATE 1000 MG CAPS (OMEGA-3 FATTY ACIDS) two caps by mouth BID   Current Allergies (reviewed today): ! PCN  Past Medical History:    Reviewed history from 07/05/2007 and no changes required:       Uterine Fibroids       Abn. Pap, hx of       Anemia-NOS       GERD       Hyperlipidemia       Hypertension       Osteoarthritis  Past Surgical History:    Reviewed history from 05/08/2007 and no changes required:  Wisdom Teeth Extracted       Laporoscopy       Tonsillectomy   Family History:    Reviewed history from 08/06/2008 and no changes required:       Family History of Arthritis       Family History Breast cancer 1st degree relative <50       Family History Depression       Family History Hypertension       Family History Psychiatric care       sister with bilateral THR  Social History:    Reviewed history from 05/08/2007 and no changes required:       Occupation:       Married       Never Smoked       Alcohol use-yes   Risk Factors: Tobacco use:  never Alcohol use:  yes  Family History Risk Factors:    Family History of MI in females < 33 years old:  no    Family History of MI in males < 41 years old:  no   Review of Systems  The patient denies anorexia, fever, weight loss, weight gain, vision loss, decreased hearing, hoarseness,  chest pain, syncope, dyspnea on exertion, peripheral edema, prolonged cough, headaches, hemoptysis, abdominal pain, melena, hematochezia, severe indigestion/heartburn, hematuria, incontinence, genital sores, muscle weakness, suspicious skin lesions, transient blindness, difficulty walking, depression, unusual weight change, abnormal bleeding, enlarged lymph nodes, angioedema, and breast masses.     Physical Exam  General:     alert and overweight-appearing.   Head:     normocephalic and atraumatic.   Eyes:     pupils equal and pupils round.   Ears:     External ear exam shows no significant lesions or deformities.  Otoscopic examination reveals clear canals, tympanic membranes are intact bilaterally without bulging, retraction, inflammation or discharge. Hearing is grossly normal bilaterally. Mouth:     Oral mucosa and oropharynx without lesions or exudates.  Teeth in good repair. Neck:     No deformities, masses, or tenderness noted. Lungs:     normal respiratory effort and no wheezes.   Heart:     normal rate and regular rhythm.     Abdomen:     soft and non-tender.   Msk:     decreased ROM, joint tenderness, and joint swelling.      Impression & Recommendations:  Problem # 1:  GERD (ICD-530.81) hx of stricture dz and previous dilation therapy need EGD with potential dilation? The following medications were removed from the medication list:    Prilosec 20 Mg Cpdr (Omeprazole) .Marland Kitchen... 2 once daily  Her updated medication list for this problem includes:    Kapidex 60 Mg Cpdr (Dexlansoprazole) ..... One by mouth daily  Diagnostics Reviewed:  Discussed lifestyle modifications, diet, antacids/medications, and preventive measures. Handout provided.   Problem # 2:  HYPERLIPIDEMIA (ICD-272.4)  Her updated medication list for this problem includes:    Crestor 10 Mg Tabs (Rosuvastatin calcium) .Marland Kitchen... Take 10 mg mwf every week  Labs Reviewed: Chol: 235 (10/07/2008)   HDL: 56.6 (10/07/2008)   LDL: 161.5 (10/07/2008)   TG: 137 (10/07/2008) SGOT: 23 (10/07/2008)   SGPT: 23 (10/07/2008)  Lipid Goals: Chol Goal: 200 (07/05/2007)   HDL Goal: 40 (07/05/2007)   LDL Goal: 160 (07/05/2007)   TG Goal: 150 (07/05/2007)  10 Yr Risk Heart Disease: 11 % Prior 10 Yr Risk Heart Disease: 13 % (08/06/2008)   Problem # 3:  HYPERTENSION (ICD-401.9)  Her updated medication list for this problem includes:    Micardis Hct 80-25 Mg Tabs (Telmisartan-hctz) .Marland Kitchen... 1/2 once daily  BP today: 120/70 Prior BP: 110/80 (08/06/2008)  10 Yr Risk Heart Disease: 11 % Prior 10 Yr Risk Heart Disease: 13 % (08/06/2008)  Labs Reviewed: Creat: 0.6 (07/16/2008) Chol: 235 (10/07/2008)   HDL: 56.6 (10/07/2008)   LDL: 161.5 (10/07/2008)   TG: 137 (10/07/2008)   Complete Medication List: 1)  Micardis Hct 80-25 Mg Tabs (Telmisartan-hctz) .... 1/2 once daily 2)  Cymbalta 60 Mg Cpep (Duloxetine hcl) .... Once daily 3)  Aleve 220 Mg Caps (Naproxen sodium) .... 2 once daily 4)  Fish Oil Concentrate 1000 Mg Caps (Omega-3 fatty acids) .... Two caps by  mouth bid 5)  Kapidex 60 Mg Cpdr (Dexlansoprazole) .... One by mouth daily 6)  Crestor 10 Mg Tabs (Rosuvastatin calcium) .... Take 10 mg mwf every week  Other Orders: Gastroenterology Referral (GI)  Hypertension Assessment/Plan:      The patient's hypertensive risk group is category B: At least one risk factor (excluding diabetes) with no target organ damage.  Her calculated 10 year risk of coronary heart disease is 11 %.  Today's blood  pressure is 120/70.  Her blood pressure goal is < 140/90.   Patient Instructions: 1)  Please schedule a follow-up appointment in 6weeks. 2)  Hepatic Panel prior to visit, ICD-9:  995.20 3)  Lipid Panel prior to visit, ICD-9:272.4

## 2010-10-04 NOTE — Progress Notes (Signed)
Summary: rx  Phone Note Call from Patient Call back at [phone]   Caller: pt vm triage Call For: Valton Schwartz Reason for Call: Lab or Test Results Summary of Call: pain in hip has appt with ortho.  can she get rx for anti inflammatory [facility] and [place]   Follow-up for Phone Call        left message on machine'dr Lorianna Spadaccini out of office- try otc ibuprofen Follow-up by: Willy Eddy, LPN,  April 01, 2008 1:34 PM

## 2010-10-04 NOTE — Progress Notes (Signed)
Summary: schedule EGD  ---- Converted from flag ---- ---- 02/20/2010 6:14 PM, Meryl Dare MD [facility] wrote: Will contact her to schedule EGD. Thx.  ---- 02/18/2010 4:54 PM, Stacie Glaze MD wrote: she is interested in the egd now for her swallowing ------------------------------  Phone Note Outgoing Call Call back at [facility] Phone [phone]   Call placed by: Darcey Nora RN, CGRN,  February 21, 2010 3:10 PM Call placed to: Patient Summary of Call: Left message for patient to call back  Follow-up for Phone Call        Left message for patient to call back to schedule EGD Darcey Nora RN, [facility]  February 22, 2010 9:32 AM  I have left the patient another message asking her to call and schedule an EGD if this is still her wish.   Darcey Nora RN, [facility]  February 23, 2010 8:41 AM  Additional Follow-up for Phone Call Additional follow up Details #1::        No return call from the patient.  I have left her a message asking her to call if she is interested in scheduling a EGD. Additional Follow-up by: Darcey Nora RN, CGRN,  February 24, 2010 7:39 AM

## 2010-10-04 NOTE — Medication Information (Signed)
Summary: Dextroamphetamine-Amph Approved  Dextroamphetamine-Amph Approved   Imported By: Maryln Gottron 03/10/2010 15:48:22  _____________________________________________________________________  External Attachment:    Type:   Image     Comment:   External Document

## 2010-10-04 NOTE — Assessment & Plan Note (Signed)
Summary: 4 mo rov/mm   Vital Signs:  Patient profile:   60 year old female Height:      65 inches Weight:      236 pounds BMI:     39.41 Temp:     98.2 degrees F oral Pulse rate:   76 / minute Resp:     14 per minute BP sitting:   130 / 80  (left arm) Cuff size:   large  Vitals Entered By: Willy Eddy, LPN (November 02, 1608 9:22 AM) CC: roa-fasting this am- ?omeprazole- should she take bid or qd--questions about cymbalta, Hypertension Management, Lipid Management   Primary Care Provider:  Darryll Capers, MD  CC:  roa-fasting this am- ?omeprazole- should she take bid or qd--questions about cymbalta, Hypertension Management, and Lipid Management.  History of Present Illness: weight gain 10 lbs blood pressure is up she feels that the depression is increased and this results in weight gain has noted with entering therapy she is increasingly emotinally eating   Hypertension History:      She denies headache, chest pain, palpitations, dyspnea with exertion, orthopnea, PND, peripheral edema, visual symptoms, neurologic problems, syncope, and side effects from treatment.        Positive major cardiovascular risk factors include female age 36 years old or older, hyperlipidemia, and hypertension.  Negative major cardiovascular risk factors include no history of diabetes, negative family history for ischemic heart disease, and non-tobacco-user status.        Further assessment for target organ damage reveals no history of ASHD, stroke/TIA, or peripheral vascular disease.    Lipid Management History:      Positive NCEP/ATP III risk factors include female age 1 years old or older and hypertension.  Negative NCEP/ATP III risk factors include no history of early menopause without estrogen hormone replacement, non-diabetic, no family history for ischemic heart disease, non-tobacco-user status, no ASHD (atherosclerotic heart disease), no prior stroke/TIA, no peripheral vascular disease, and no  history of aortic aneurysm.     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 1/2 Once Daily 2)  Cymbalta 60 Mg  Cpep (Duloxetine Hcl) .... Once Daily 3)  Crestor 10 Mg Tabs (Rosuvastatin Calcium) .... Take 10 Mg Mwf Every Week 4)  Omeprazole 20 Mg Tbec (Omeprazole) .... 2 Once Daily 5)  Zolpidem Tartrate 5 Mg Tabs (Zolpidem Tartrate) .... One By Mouth Q Hs As Needed Insomnia.  Current Medications (verified): 1)  Micardis Hct 80-25 Mg Tabs (Telmisartan-Hctz) .... 1/2 Once Daily 2)  Cymbalta 60 Mg  Cpep (Duloxetine Hcl) .... Once Daily 3)  Crestor 10 Mg Tabs (Rosuvastatin Calcium) .... Take 10 Mg Mwf Every Week 4)  Omeprazole 20 Mg Tbec (Omeprazole) .... 2 Once Daily 5)  Aspir-Low 81 Mg Tbec (Aspirin) .Marland KitchenMarland KitchenMarland Kitchen 1  Once Daily 6)  Amphetamine-Dextroamphetamine 20 Mg Xr24h-Cap (Amphetamine-Dextroamphetamine) .... One By Mouth Daily  Allergies (verified): 1)  ! Pcn  Past History:  Family History: Last updated: 11/25/2008 Family History of Arthritis Family History Breast cancer 1st degree relative <50 Mother Family History Depression Family History Hypertension Family History Psychiatric care sister with bilateral THR Family History of Heart  Disease: Father  Social History: Last updated: 11/25/2008 Occupation:Teacher Married Never Smoked Alcohol use-yes Daily Caffeine Use -1 Illicit Drug Use - no  Risk Factors: Alcohol Use: <1 (11/02/2009)  Risk Factors: Smoking Status: never (11/02/2009)  Past medical, surgical, family and social histories (including risk factors) reviewed, and no changes noted (except as noted below).  Past Medical History: Reviewed history from 11/25/2008 and no changes required. Uterine Fibroids Abn. Pap, hx of Anemia GERD Hyperlipidemia Hypertension Osteoarthritis Anxiety Disorder Obesity Hiatal hernia Esophageal stricture 1994  Past Surgical History: Reviewed history from 11/25/2008 and no changes required. Wisdom Teeth Extracted Laporoscopy Tonsillectomy Hysterectomy  Family History: Reviewed history from 11/25/2008 and no changes required. Family History of Arthritis Family History Breast cancer 1st degree relative <50 Mother Family History Depression Family History Hypertension Family History Psychiatric care sister with bilateral THR Family History of Heart Disease: Father  Social History: Reviewed history from 11/25/2008 and no changes required. Occupation:Teacher Married Never Smoked Alcohol use-yes Daily Caffeine Use -1 Illicit Drug Use - no  Review of Systems       The patient complains of depression.  The patient denies anorexia, fever, weight loss, weight gain, vision loss, decreased hearing, hoarseness, chest pain, syncope, dyspnea on exertion, peripheral edema, prolonged cough, headaches, hemoptysis, abdominal pain, melena, hematochezia, severe indigestion/heartburn, hematuria, incontinence, genital sores, muscle weakness, suspicious skin lesions, transient blindness, difficulty walking, unusual weight change, abnormal bleeding, enlarged lymph nodes, angioedema, and breast masses.    Physical Exam  General:  overweight-appearing.  no distress Head:   Normocephalic and atraumatic. Eyes:  PERRLA, no icterus. Ears:  R ear normal and L ear normal.   Nose:  no external deformity and no nasal discharge.   Mouth:  pharynx pink and moist and no erythema.   Neck:  Supple; no masses or thyromegaly. Lungs:  Clear throughout to auscultation. Heart:  Regular rate and rhythm; no murmurs, rubs,  or bruits. Abdomen:  soft and non-tender.   Msk:  No deformity or scoliosis noted of thoracic or lumbar spine.   Pulses:  R and L carotid,radial,femoral,dorsalis pedis and posterior tibial pulses are full and equal bilaterally Extremities:  No clubbing, cyanosis, edema, or deformity noted with normal full range of motion of all joints.   Neurologic:  No cranial nerve deficits noted. Station and gait are normal. Plantar reflexes are down-going bilaterally. DTRs are symmetrical throughout. Sensory, motor and coordinative functions appear intact.   Impression & Recommendations:  Problem # 1:  ADJUSTMENT DISORDER WITH DEPRESSED MOOD (ICD-309.0) Assessment Deteriorated add  ADHD drug for increased somnolence and hypoactivity the pt has used xanax for focus when she has has "panic" but htis does not occure all the time weigth is also an issue  Problem # 2:  HYPERTENSION (ICD-401.9) Assessment: Improved  Her updated medication list for this problem includes:    Micardis Hct 80-25 Mg Tabs (Telmisartan-hctz) .Marland Kitchen... 1/2 once daily  BP today: 130/80 Prior BP: 110/70 (07/08/2009)  Prior 10 Yr Risk Heart Disease: 8 % (01/01/2009)  Labs Reviewed: K+: 4.5 (07/16/2008) Creat: : 0.6 (07/16/2008)   Chol: 183 (06/24/2009)   HDL: 52.40 (06/24/2009)  LDL: 114 (06/24/2009)   TG: 84.0 (06/24/2009)  Problem # 3:  HYPERLIPIDEMIA (ICD-272.4) Assessment: Unchanged  Her updated medication list for this problem includes:    Crestor 10 Mg Tabs (Rosuvastatin calcium) .Marland Kitchen... Take 10 mg mwf every week  Labs Reviewed: SGOT: 22 (06/24/2009)   SGPT: 23 (06/24/2009)  Lipid  Goals: Chol Goal: 200 (07/05/2007)   HDL Goal: 40 (07/05/2007)   LDL Goal: 160 (07/05/2007)   TG Goal: 150 (07/05/2007)  Prior 10 Yr Risk Heart Disease: 8 % (01/01/2009)   HDL:52.40 (06/24/2009), 56.00 (12/17/2008)  LDL:114 (06/24/2009), 118 (12/17/2008)  Chol:183 (06/24/2009), 192 (12/17/2008)  Trig:84.0 (06/24/2009), 92.0 (12/17/2008)  Problem # 4:  OBESITY, UNSPECIFIED (ICD-278.00) will moniter the effect of the adhd and suggest DASH diet  Complete Medication List: 1)  Micardis Hct 80-25 Mg Tabs (Telmisartan-hctz) .... 1/2 once daily 2)  Cymbalta 60 Mg Cpep (Duloxetine hcl) .... Once daily 3)  Crestor 10 Mg Tabs (Rosuvastatin calcium) .... Take 10 mg mwf every week 4)  Omeprazole 20 Mg Tbec (Omeprazole) .... 2 once daily 5)  Aspir-low 81 Mg Tbec (Aspirin) .Marland Kitchen.. 1 once daily 6)  Amphetamine-dextroamphetamine 20 Mg Xr24h-cap (Amphetamine-dextroamphetamine) .... One by mouth daily  Hypertension Assessment/Plan:      The patient's hypertensive risk group is category B: At least one risk factor (excluding diabetes) with no target organ damage.  Her calculated 10 year risk of coronary heart disease is 8 %.  Today's blood pressure is 130/80.  Her blood pressure goal is < 140/90.  Lipid Assessment/Plan:      Based on NCEP/ATP III, the patient's risk factor category is "0-1 risk factors".  The patient's lipid goals are as follows: Total cholesterol goal is 200; LDL cholesterol goal is 160; HDL cholesterol goal is 40; Triglyceride goal is 150.  Her LDL cholesterol goal has been met.     Patient Instructions: 1)  Please schedule a follow-up appointment in 1 month. Prescriptions: AMPHETAMINE-DEXTROAMPHETAMINE 20 MG XR24H-CAP (AMPHETAMINE-DEXTROAMPHETAMINE) one by mouth daily  #30 x 0   Entered and Authorized by:   Stacie Glaze MD   Signed by:   Stacie Glaze MD on 11/02/2009   Method used:   Print then Give to Patient   RxID:   [id number] AMPHETAMINE-DEXTROAMPHETAMINE 20 MG XR24H-CAP  (AMPHETAMINE-DEXTROAMPHETAMINE) one by mouth daily  #30 x 0   Entered and Authorized by:   Stacie Glaze MD   Signed by:   Stacie Glaze MD on 11/02/2009   Method used:   Print then Give to Patient   RxID:   [id number]   Appended Document: Preload-Problems-Medications-Allergies     Preload Clinical Lists   ADHD (ICD-314.01)

## 2010-10-04 NOTE — Procedures (Signed)
Summary: EGD  EGD   Imported By: Lowry Ram CMA 11/24/2008 12:43:08  _____________________________________________________________________  External Attachment:    Type:   Image     Comment:   External Document

## 2010-10-04 NOTE — Progress Notes (Signed)
Summary: Pt req samples of Crestor or Script  Phone Note Call from Patient Call back at [phone]   Caller: Patient Reason for Call: Privacy/Consent Authorization Summary of Call: Pt has run out of Crestor 10mg . Pt req samples or a script. If a script is called in, pls call in to [facility].  Initial call taken by: Lucy Antigua,  June 17, 2010 4:19 PM  Follow-up for Phone Call        pleae let pt kno it was sent in Follow-up by: Willy Eddy, LPN,  June 17, 2010 4:22 PM  Additional Follow-up for Phone Call Additional follow up Details #1::        Pt has been notified that script has been called in.  Additional Follow-up by: Lucy Antigua,  June 20, 2010 4:21 PM    Prescriptions: CRESTOR 10 MG TABS (ROSUVASTATIN CALCIUM) take 10 mg MWF every week  #15 x 6   Entered by:   Willy Eddy, LPN   Authorized by:   Stacie Glaze MD   Signed by:   Willy Eddy, LPN on 29/52/8413   Method used:   Electronically to        [facility] [address] [phone]* (retail)       [address]       [place], [place]  [address]       Ph: [phone] or [phone]       Fax: [phone]   RxID:   [id number]

## 2010-10-04 NOTE — Assessment & Plan Note (Signed)
Summary: 2 MONTH FOLLOW UP/CJR   Vital Signs:  Patient profile:   60 year old female Height:      65 inches Weight:      230 pounds BMI:     38.41 Temp:     98.2 degrees F oral Pulse rate:   72 / minute Resp:     14 per minute BP sitting:   130 / 80  (left arm)  Vitals Entered By: Willy Eddy, LPN (February 18, 2010 4:30 PM) CC: roc- currently on trmethoprim 1 qd all the time, Hypertension Management   Primary Care Provider:  Darryll Capers, MD  CC:  roc- currently on trmethoprim 1 qd all the time and Hypertension Management.  History of Present Illness: the pt presents for review of medications has lost 8 lbs blood pressure is good has a trip palnned to [place] and has a flight anxiety  Hypertension History:      She denies headache, chest pain, palpitations, dyspnea with exertion, orthopnea, PND, peripheral edema, visual symptoms, neurologic problems, syncope, and side effects from treatment.        Positive major cardiovascular risk factors include female age 41 years old or older, hyperlipidemia, and hypertension.  Negative major cardiovascular risk factors include no history of diabetes, negative family history for ischemic heart disease, and non-tobacco-user status.        Further assessment for target organ damage reveals no history of ASHD, stroke/TIA, or peripheral vascular disease.     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) 4)  Preoperative Examination  (ICD-V72.84) 5)  Uti  (ICD-599.0) 6)  Dysphagia Pharyngoesophageal Phase  (TDD-220.25) 7)  Dysphagia Oropharyngeal Phase  (ICD-787.22) 8)  Acute Cystitis  (ICD-595.0) 9)  Lumbar Radiculopathy, Right  (ICD-724.4) 10)  Osteoarthritis  (ICD-715.90) 11)  Hypertension  (ICD-401.9) 12)   Hyperlipidemia  (ICD-272.4) 13)  Family History Depression  (ICD-V17.0) 14)  Family History Breast Cancer 1st Degree Relative <50  (ICD-V16.3) 15)  Gerd  (ICD-530.81) 16)  Anemia-nos  (ICD-285.9)  Current Problems (verified): 1)  Adhd  (ICD-314.01) 2)  Obesity, Unspecified  (ICD-278.00) 3)  Adjustment Disorder With Depressed Mood  (ICD-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 1/2 Once Daily 2)  Cymbalta 60 Mg  Cpep (Duloxetine Hcl) .... Once Daily 3)  Crestor 10 Mg Tabs (Rosuvastatin Calcium) .... Take 10 Mg Mwf Every Week 4)  Omeprazole 40 Mg Cpdr (Omeprazole) .Marland Kitchen.. 1 Once Daily 5)  Aspir-Low 81 Mg Tbec (Aspirin) .Marland Kitchen.. 1 Once Daily 6)  Amphetamine-Dextroamphetamine 20 Mg Xr24h-Cap (Amphetamine-Dextroamphetamine) .... One By Mouth Daily 7)  Trimethoprim 100 Mg Tabs (Trimethoprim) .... Once Daily  Current Medications (verified): 1)  Micardis Hct 80-25 Mg Tabs (Telmisartan-Hctz) .... 1/2 Once Daily 2)  Cymbalta 60 Mg  Cpep (Duloxetine Hcl) .... Once Daily 3)  Crestor 10 Mg Tabs (Rosuvastatin Calcium) .... Take 10 Mg Mwf Every Week 4)  Omeprazole 40 Mg Cpdr (Omeprazole) .Marland Kitchen.. 1 Once Daily 5)  Amphetamine-Dextroamphetamine 20 Mg Xr24h-Cap (Amphetamine-Dextroamphetamine) .... One By Mouth Daily 6)  Trimethoprim 100 Mg Tabs (Trimethoprim) .... Once Daily  Allergies (verified): 1)  ! 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  Past Medical History: Reviewed history from 01/21/2010 and no changes required. Uterine Fibroids Abn. Pap, hx of Anemia GERD Hyperlipidemia Hypertension Osteoarthritis Anxiety Disorder Obesity Hiatal hernia Esophageal stricture 1994 frequent UTIs, sees Dr. Vonita Moss  Past Surgical History: Reviewed history from 11/25/2008 and no changes required. Wisdom Teeth Extracted Laporoscopy Tonsillectomy Hysterectomy  Family History: Reviewed history from 11/25/2008 and no changes required. Family History of Arthritis Family History Breast cancer 1st degree relative <50 Mother Family History Depression Family History Hypertension Family History Psychiatric care sister with bilateral THR Family History of Heart Disease: Father  Social History: Reviewed history from 11/25/2008 and no changes required. 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    Physical Exam  General:  Well-developed,well-nourished,in no acute distress; alert,appropriate and cooperative throughout examination Head:  Normocephalic and atraumatic. Eyes:  PERRLA, no icterus. Ears:  R ear normal and L ear normal.   Nose:  no external deformity and no nasal discharge.   Mouth:  pharynx pink and moist and no erythema.   Neck:  supple and full ROM.   Lungs:  normal respiratory effort and no wheezes.   Heart:  normal rate and regular rhythm.   Abdomen:  Bowel sounds positive,abdomen soft and non-tender without masses, organomegaly or hernias noted. Msk:  No deformity or scoliosis noted of thoracic or lumbar spine.   Extremities:  No clubbing, cyanosis, edema, or deformity noted with normal full range of motion of all joints.   Neurologic:  No cranial nerve deficits noted. Station and gait are normal. Plantar reflexes are down-going bilaterally. DTRs are symmetrical throughout. Sensory, motor and coordinative functions appear intact.   Impression & Recommendations:  Problem # 1:  HYPERTENSION (ICD-401.9)  Her updated medication list for this problem includes:    Micardis Hct 80-25 Mg Tabs (Telmisartan-hctz) .Marland Kitchen... 1/2 once daily  BP today: 130/80 Prior BP: 110/84 (01/21/2010)  10 Yr Risk Heart Disease: 6 % Prior 10 Yr Risk Heart Disease: 8 % (01/01/2009)  Labs Reviewed: K+: 4.5 (07/16/2008) Creat: : 0.6 (07/16/2008)   Chol: 176 (12/07/2009)   HDL: 56.20 (12/07/2009)   LDL: 95 (12/07/2009)   TG: 125.0 (12/07/2009)  Problem # 2:  ADJUSTMENT DISORDER WITH DEPRESSED MOOD (ICD-309.0)  Problem # 3:  DYSPHAGIA PHARYNGOESOPHAGEAL PHASE (KVQ-259.56) pt to call for egd  Problem # 4:  ADHD (ICD-314.01)  Complete Medication List: 1)  Micardis Hct 80-25 Mg Tabs (Telmisartan-hctz) ....  1/2 once daily 2)  Cymbalta 60 Mg Cpep (Duloxetine hcl) .... Once daily 3)  Crestor 10 Mg Tabs (Rosuvastatin calcium) .... Take 10 mg mwf every week 4)  Omeprazole 40 Mg Cpdr (Omeprazole) .Marland Kitchen.. 1 once daily 5)  Trimethoprim 100 Mg Tabs (Trimethoprim) .... Once daily 6)  Alprazolam 0.5 Mg Tabs (Alprazolam) .... One by mouth q 6 hour as needed anxiety 7)  Methamphetamine Hcl 5 Mg Tabs (Methamphetamine hcl) .... One by mouth q am 8)  Trimethoprim 100 Mg Tabs (Trimethoprim) .... One by mouth daily  Hypertension Assessment/Plan:      The patient's hypertensive risk group is category B: At least one risk factor (excluding diabetes) with no target organ damage.  Her calculated 10 year risk of coronary heart disease is 6 %.  Today's blood pressure is 130/80.  Her blood pressure goal is < 140/90.  Patient Instructions: 1)  Please schedule a follow-up appointment in 2 months. Prescriptions: METHAMPHETAMINE HCL 5 MG TABS (METHAMPHETAMINE HCL) one by mouth q AM  #30 x 0   Entered and Authorized by:   Stacie Glaze MD   Signed by:   Stacie Glaze MD on 02/18/2010   Method used:   Print then Give to Patient   RxID:   [id number] METHAMPHETAMINE HCL 5 MG TABS (METHAMPHETAMINE HCL) one by mouth q AM  #30 x 0   Entered and Authorized by:   Stacie Glaze MD   Signed by:   Stacie Glaze MD on 02/18/2010   Method used:   Print then Give to Patient   RxID:   [id number] ALPRAZOLAM 0.5 MG TABS (ALPRAZOLAM) one by mouth q 6 hour as needed anxiety  #30 x 0   Entered and Authorized by:   Stacie Glaze MD   Signed by:   Stacie Glaze MD on 02/18/2010   Method used:   Print then Give to Patient   RxID:   [id number]

## 2010-10-04 NOTE — Assessment & Plan Note (Signed)
Summary: 3 month fu/njr   Vital Signs:  Patient Profile:   60 Years Old Female Height:     66 inches Weight:      238 pounds Temp:     98.5 degrees F oral Pulse rate:   76 / minute Resp:     14 per minute BP sitting:   130 / 80  (left arm)  Vitals Entered By: Willy Eddy, LPN (July 05, 2007 1:33 PM)                .  Chief Complaint:  roa labs.  History of Present Illness: follow up of chronic problems  Hypertension History:      She denies headache, chest pain, palpitations, dyspnea with exertion, orthopnea, PND, peripheral edema, visual symptoms, neurologic problems, syncope, and side effects from treatment.  She notes no problems with any antihypertensive medication side effects.  Further comments include: new golas are 135/85.        Positive major cardiovascular risk factors include female age 70 years old or older, hyperlipidemia, and hypertension.  Negative major cardiovascular risk factors include no history of diabetes, negative family history for ischemic heart disease, and non-tobacco-user status.        Further assessment for target organ damage reveals no history of ASHD, stroke/TIA, or peripheral vascular disease.    Lipid Management History:      Positive NCEP/ATP III risk factors include female age 25 years old or older and hypertension.  Negative NCEP/ATP III risk factors include no history of early menopause without estrogen hormone replacement, non-diabetic, no family history for ischemic heart disease, non-tobacco-user status, no ASHD (atherosclerotic heart disease), no prior stroke/TIA, no peripheral vascular disease, and no history of aortic aneurysm.      Current Allergies: ! PCN  Past Medical History:    Reviewed history from 05/08/2007 and no changes required:       Uterine Fibroids       Abn. Pap, hx of       Anemia-NOS       GERD       Hyperlipidemia       Hypertension       Osteoarthritis  Past Surgical History:    Reviewed  history from 05/08/2007 and no changes required:       Wisdom Teeth Extracted       Laporoscopy       Tonsillectomy     Review of Systems  The patient denies anorexia, hoarseness, chest pain, syncope, dyspnea on exhertion, peripheral edema, prolonged cough, hemoptysis, and abdominal pain.     Physical Exam  General:     overweight-appearing.   Head:     Normocephalic and atraumatic without obvious abnormalities. No apparent alopecia or balding. Mouth:     Oral mucosa and oropharynx without lesions or exudates.  Teeth in good repair. Neck:     No deformities, masses, or tenderness noted. Lungs:     Normal respiratory effort, chest expands symmetrically. Lungs are clear to auscultation, no crackles or wheezes. Heart:     Normal rate and regular rhythm. S1 and S2 normal without gallop, murmur, click, rub or other extra sounds. Abdomen:     Bowel sounds positive,abdomen soft and non-tender without masses, organomegaly or hernias noted. Msk:     joint tenderness, joint swelling, and joint warmth.   Pulses:     R and L carotid,radial,femoral,dorsalis pedis and posterior tibial pulses are full and equal bilaterally Extremities:     No  clubbing, cyanosis, edema, or deformity noted with normal full range of motion of all joints.   Neurologic:     alert & oriented X3.      Impression & Recommendations:  Problem # 1:  HYPERTENSION (ICD-401.9) stable Her updated medication list for this problem includes:    Micardis Hct 80-25 Mg Tabs (Telmisartan-hctz) .Marland Kitchen... 1/2 once daily  BP today: 130/80  10 Yr Risk Heart Disease: 9 %  Labs Reviewed: Chol: 182 (06/26/2007)   HDL: 46.8 (06/26/2007)   LDL: 113 (06/26/2007)   TG: 109 (06/26/2007)   Problem # 2:  HYPERLIPIDEMIA (ICD-272.4) tolerating medications with a significant drop in lipids Her updated medication list for this problem includes:    Zocor 20 Mg Tabs (Simvastatin) ..... Once daily  Labs Reviewed: Chol: 182  (06/26/2007)   HDL: 46.8 (06/26/2007)   LDL: 113 (06/26/2007)   TG: 109 (06/26/2007) SGOT: 28 (06/26/2007)   SGPT: 32 (06/26/2007)  Lipid Goals: Chol Goal: 200 (07/05/2007)   HDL Goal: 40 (07/05/2007)   LDL Goal: 160 (07/05/2007)   TG Goal: 150 (07/05/2007)  10 Yr Risk Heart Disease: 9 %   Problem # 3:  Preventive Health Care (ICD-V70.0) lot [id number] Exp 03/03/2008   [employer] left deltoid   Problem # 4:  OSTEOARTHRITIS (ICD-715.90) fish oil   [facility] 3-4 caps a day  Complete Medication List: 1)  Micardis Hct 80-25 Mg Tabs (Telmisartan-hctz) .... 1/2 once daily 2)  Prilosec 20 Mg Cpdr (Omeprazole) .... 2 once daily 3)  Zocor 20 Mg Tabs (Simvastatin) .... Once daily 4)  Cymbalta 60 Mg Cpep (Duloxetine hcl) .... Once daily  Other Orders: Flu Vaccine 40yrs + (27253) Admin 1st Vaccine (66440)  Hypertension Assessment/Plan:      The patient's hypertensive risk group is category B: At least one risk factor (excluding diabetes) with no target organ damage.  Her calculated 10 year risk of coronary heart disease is 9 %.  Today's blood pressure is 130/80.  Her blood pressure goal is < 140/90.  Lipid Assessment/Plan:      Based on NCEP/ATP III, the patient's risk factor category is "2 or more risk factors and a calculated 10 year CAD risk of < 20%".  From this information, the patient's calculated lipid goals are as follows: Total cholesterol goal is 200; LDL cholesterol goal is 130; HDL cholesterol goal is 40; Triglyceride goal is 150.  Her LDL cholesterol goal has been met.     Patient Instructions: 1)  fish oil purified omega three  ( 1000mg )  3-4 daily 2)  keep on other meds 3)  Please schedule a follow-up appointment in 4 months. 4)  It is important that you exercise regularly at least 20 minutes 5 times a week. If you develop chest pain, have severe difficulty breathing, or feel very tired , stop exercising immediately and seek medical attention.    ]  Influenza Vaccine     Vaccine Type: Fluvax 3+    Site: left deltoid    Dose: 0.5 ml    Route: IM    Given by: Stacie Glaze MD  Flu Vaccine Consent Questions    Do you have a history of severe allergic reactions to this vaccine? no    Any prior history of allergic reactions to egg and/or gelatin? no    Do you have a sensitivity to the preservative Thimersol? no    Do you have a past history of Guillan-Barre Syndrome? no    Do you currently have  an acute febrile illness? no    Have you ever had a severe reaction to latex? no    Vaccine information given and explained to patient? yes    Are you currently pregnant? no

## 2010-10-04 NOTE — Progress Notes (Signed)
Summary: ADHD  Phone Note From Other Clinic   Caller: [employer] Call For: Dr. Lovell Sheehan Summary of Call: Form needs to be corrected and faxed.  If no form, needs to be written on letter head that pt has ADHD [phone] Attention :  Pharmacy appeal [phone] Initial call taken by: [facility] CMA,  Jan 27, 2010 4:06 PM  Follow-up for Phone Call        will conplete with Dominika Losey next week Follow-up by: Stacie Glaze MD,  Jan 27, 2010 4:20 PM  Additional Follow-up for Phone Call Additional follow up Details #1::        appropriate forms faxed. Additional Follow-up by: Lynann Beaver CMA,  Jan 27, 2010 4:21 PM

## 2010-10-04 NOTE — Progress Notes (Signed)
Summary: FYI, pt stopped Zocor  Phone Note Call from Patient Call back at [facility] Phone [phone]   Caller: Patient Call For: Kristin [name] Summary of Call: FYI Dr Kristin [name], Pt stopped her Zocor again, had been experiencing alot of arm and leg pain.  Also developed Rt hip pain x 3 weeks and X-rays of hips and hand, osteoarthritis.  Pt will F/U with Dr Kristin [name] sometime in Aug. Initial call taken by: Sid Falcon LPN,  March 31, 2008 9:02 AM  Follow-up for Phone Call        dr Kristin [name] notified and understood Follow-up by: Willy Eddy, LPN,  March 31, 2008 9:46 AM

## 2010-10-04 NOTE — Consult Note (Signed)
Summary: Dr Alveda Reasons note  Dr Alveda Reasons note   Imported By: Kassie Mends 12/02/2007 13:41:54  _____________________________________________________________________  External Attachment:    Type:   Image     Comment:   Dr Alveda Reasons note

## 2010-10-04 NOTE — Assessment & Plan Note (Signed)
Summary: hamstring pain/ccm   Vital Signs:  Patient Profile:   60 Years Old Female Height:     66 inches Weight:      237 pounds Temp:     98.2 degrees F oral Pulse rate:   76 / minute Resp:     14 per minute BP sitting:   120 / 80  (left arm)                 Chief Complaint:  c/o rt hip pain/started 2 weeks ago when she woke up and couldnt move rt leg/now painful when sitting/c/o arthritic hands.  History of Present Illness: pain in back of right leg with increased pain and stiffness following sitting... no acute injury    Hypertension History:      She denies headache, chest pain, palpitations, dyspnea with exertion, orthopnea, PND, peripheral edema, visual symptoms, neurologic problems, syncope, and side effects from treatment.        Positive major cardiovascular risk factors include female age 79 years old or older, hyperlipidemia, and hypertension.  Negative major cardiovascular risk factors include no history of diabetes, negative family history for ischemic heart disease, and non-tobacco-user status.        Further assessment for target organ damage reveals no history of ASHD, stroke/TIA, or peripheral vascular disease.       Prior Medication List:  MICARDIS HCT 80-25 MG TABS (TELMISARTAN-HCTZ) 1/2 once daily PRILOSEC 20 MG CPDR (OMEPRAZOLE) 2 once daily ZOCOR 20 MG  TABS (SIMVASTATIN) once daily CYMBALTA 60 MG  CPEP (DULOXETINE HCL) once daily   Current Allergies (reviewed today): ! PCN  Past Medical History:    Reviewed history from 07/05/2007 and no changes required:       Uterine Fibroids       Abn. Pap, hx of       Anemia-NOS       GERD       Hyperlipidemia       Hypertension       Osteoarthritis  Past Surgical History:    Reviewed history from 05/08/2007 and no changes required:       Wisdom Teeth Extracted       Laporoscopy       Tonsillectomy   Family History:    Reviewed history from 05/08/2007 and no changes required:       Family  History of Arthritis       Family History Breast cancer 1st degree relative <50       Family History Depression       Family History Hypertension       Family History Psychiatric care  Social History:    Reviewed history from 05/08/2007 and no changes required:       Occupation:       Married       Never Smoked       Alcohol use-yes     Physical Exam  General:     Well-developed,well-nourished,in no acute distress; alert,appropriate and cooperative throughout examination Head:     Normocephalic and atraumatic without obvious abnormalities. No apparent alopecia or balding. Ears:     External ear exam shows no significant lesions or deformities.  Otoscopic examination reveals clear canals, tympanic membranes are intact bilaterally without bulging, retraction, inflammation or discharge. Hearing is grossly normal bilaterally. Nose:     External nasal examination shows no deformity or inflammation. Nasal mucosa are pink and moist without lesions or exudates. Msk:     joint swelling, lumbar  lordosis, SI joint tenderness, and trigger point tenderness.   Pulses:     R and L carotid,radial,femoral,dorsalis pedis and posterior tibial pulses are full and equal bilaterally Extremities:     No clubbing, cyanosis, edema, or deformity noted with normal full range of motion of all joints.   Neurologic:     No cranial nerve deficits noted. Station and gait are normal. Plantar reflexes are down-going bilaterally. DTRs are symmetrical throughout. Sensory, motor and coordinative functions appear intact.    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 Back care instructions given. To be seen in 2 weeks if no improvement; sooner if worsening of symptoms.  Her updated medication list for this problem  includes:    Flexeril 10 Mg Tab (Cyclobenzaprine hcl) .Marland Kitchen... Take one tablet by mouth three times a day    Vicodin 5-500 Mg Tabs (Hydrocodone-acetaminophen) ..... One by mouth q 6 hours prn   Problem # 2:  HYPERTENSION (ICD-401.9)  Her updated medication list for this problem includes:    Micardis Hct 80-25 Mg Tabs (Telmisartan-hctz) .Marland Kitchen... 1/2 once daily  BP today: 120/80 Prior BP: 130/80 (07/05/2007)  Prior 10 Yr Risk Heart Disease: 9 % (07/05/2007)  Labs Reviewed: Chol: 182 (06/26/2007)   HDL: 46.8 (06/26/2007)   LDL: 113 (06/26/2007)   TG: 109 (06/26/2007)   Problem # 3:  HYPERLIPIDEMIA (ICD-272.4)  Her updated medication list for this problem includes:    Zocor 20 Mg Tabs (Simvastatin) ..... Once daily  Labs Reviewed: Chol: 182 (06/26/2007)   HDL: 46.8 (06/26/2007)   LDL: 113 (06/26/2007)   TG: 109 (06/26/2007) SGOT: 28 (06/26/2007)   SGPT: 32 (06/26/2007)  Lipid Goals: Chol Goal: 200 (07/05/2007)   HDL Goal: 40 (07/05/2007)   LDL Goal: 160 (07/05/2007)   TG Goal: 150 (07/05/2007)  Prior 10 Yr Risk Heart Disease: 9 % (07/05/2007)   Complete Medication List: 1)  Micardis Hct 80-25 Mg Tabs (Telmisartan-hctz) .... 1/2 once daily 2)  Prilosec 20 Mg Cpdr (Omeprazole) .... 2 once daily 3)  Zocor 20 Mg Tabs (Simvastatin) .... Once daily 4)  Cymbalta 60 Mg Cpep (Duloxetine hcl) .... Once daily 5)  Flexeril 10 Mg Tab (Cyclobenzaprine hcl) .... Take one tablet by mouth three times a day 6)  Vicodin 5-500 Mg Tabs (Hydrocodone-acetaminophen) .... One by mouth q 6 hours prn 7)  Medrol 4 Mg Tabs (Methylprednisolone) .... Three by mouth for 3 days the 2 by mouth for 3 days the 1 by mouth for three days  Hypertension Assessment/Plan:      The patient's hypertensive risk group is category B: At least one risk factor (excluding diabetes) with no target organ damage.  Her calculated 10 year risk of coronary heart disease is 9 %.  Today's blood pressure is 120/80.  Her blood pressure goal  is < 140/90.   Patient Instructions: 1)  Please schedule a follow-up appointment as needed.    Prescriptions: MEDROL 4 MG  TABS (METHYLPREDNISOLONE) three by mouth for 3 days the 2 by mouth for 3 days the 1 by mouth for three days  #18 x 0   Entered and Authorized by:   Stacie Glaze MD   Signed by:   Stacie Glaze MD on 10/17/2007   Method used:   Print then Give to Patient   RxID:   [id number] VICODIN 5-500 MG  TABS (HYDROCODONE-ACETAMINOPHEN)  one by mouth q 6 hours prn  #30 x 0   Entered and Authorized by:   Stacie Glaze MD   Signed by:   Stacie Glaze MD on 10/17/2007   Method used:   Print then Give to Patient   RxID:   [id number] FLEXERIL 10 MG TAB (CYCLOBENZAPRINE HCL) Take one tablet by mouth three times a day  #30 x 0   Entered and Authorized by:   Stacie Glaze MD   Signed by:   Stacie Glaze MD on 10/17/2007   Method used:   Electronically sent to ...       [facility]  [id number]*       [address]       [place], [place]  [address]       Ph: [phone] or [phone]       Fax: [phone]   RxID:   [id number]  ]

## 2010-10-04 NOTE — Progress Notes (Signed)
Summary: need lab order   Phone Note Call from Patient Call back at Home Phone [phone]   Caller: Patient Call For: dr jj Summary of Call: pt would like blood work sch prior to 07-05-07 appt. please order Initial call taken by: Heron Sabins,  June 19, 2007 9:11 AM  Follow-up for Phone Call        lipid 272.4 liver 995.20 please order Follow-up by: Stacie Glaze MD,  June 21, 2007 1:24 PM  Additional Follow-up for Phone Call Additional follow up Details #1::        lmom -labs sch for 06-26-07 9.45a Additional Follow-up by: Heron Sabins,  June 24, 2007 1:58 PM

## 2010-10-04 NOTE — Progress Notes (Signed)
Summary: results of MRI   Phone Note Call from Patient Call back at [phone]   Caller: patient vm triage Call For: Lovell Sheehan Reason for Call: Talk to Doctor Summary of Call: Had MRI done on Feb 21.  Would like results  Initial call taken by: Roselle Locus,  October 30, 2007 9:26 AM  Follow-up for Phone Call        pt informed she has bulging disc per drjenkins.  referral sent to [facility] for nuerosurgeon Follow-up by: Willy Eddy, LPN,  October 30, 2007 9:40 AM

## 2010-10-04 NOTE — Progress Notes (Signed)
Summary: Valuim called in  Phone Note From Other Clinic   Caller: [facility] Call For: Triage Summary of Call: Patient is scheduled for a MRI on 02/15. Patient wants Valium called in to [facility] on [address] Initial call taken by: Florentina Addison,  October 17, 2007 12:39 PM  Follow-up for Phone Call        10 mg one hour prior to procedure Follow-up by: Stacie Glaze MD,  October 17, 2007 2:07 PM  Additional Follow-up for Phone Call Additional follow up Details #1::        Rx sent electronically, Drinda Butts at [facility] will let pt know. Additional Follow-up by: Sid Falcon LPN,  October 17, 2007 2:53 PM    New/Updated Medications: VALIUM 10 MG  TABS (DIAZEPAM) one tab by mouth one hour prior to procedure   Prescriptions: VALIUM 10 MG  TABS (DIAZEPAM) one tab by mouth one hour prior to procedure  #1 x 0   Entered by:   Sid Falcon LPN   Authorized by:   Stacie Glaze MD   Signed by:   Sid Falcon LPN on 16/06/9603   Method used:   Electronically sent to ...       [facility]  [id number]*       [address]       [place], [place]  [address]       Ph: [phone] or [phone]       Fax: [phone]   RxID:   [id number]

## 2010-10-04 NOTE — Assessment & Plan Note (Signed)
Summary: bladder inf/njr   Vital Signs:  Patient profile:   60 year old female Weight:      229 pounds Temp:     98.2 degrees F oral BP sitting:   110 / 86  (left arm) Cuff size:   regular  Vitals Entered By: Raechel Ache, RN (December 03, 2008 3:48 PM)  Primary Care Provider:  Darryll Capers, MD  CC:  C/o urgency and pelvic pressure since yesterday.Kristin [name]  History of Present Illness: 60 year old female with a prior history of UTIs, who presents with her usual, urgency, dysuria, pressure, and discomfort.  No fever or chills.  She states that she has had approximately 7 prior UTIs as a hysterectomy  Allergies: 1)  ! Pcn  Physical Exam  General:  overweight-appearing.  no distress   Impression & Recommendations:  Problem # 1:  UTI (ICD-599.0)  Her updated medication list for this problem includes:    Ciprofloxacin Hcl 500 Mg Tabs (Ciprofloxacin hcl) ..... One twice daily  Complete Medication List: 1)  Micardis Hct 80-25 Mg Tabs (Telmisartan-hctz) .... 1/2 once daily 2)  Cymbalta 60 Mg Cpep (Duloxetine hcl) .... Once daily 3)  Aleve 220 Mg Caps (Naproxen sodium) .... 4 tablets daily 4)  Kapidex 60 Mg Cpdr (Dexlansoprazole) .... One by mouth daily 5)  Crestor 10 Mg Tabs (Rosuvastatin calcium) .... Take 10 mg mwf every week 6)  Ciprofloxacin Hcl 500 Mg Tabs (Ciprofloxacin hcl) .... One twice daily  Other Orders: UA Dipstick w/o Micro (manual) (29562)  Patient Instructions: 1)  Drink as much fluid as you can tolerate for the next few days. 2)  Take your antibiotic as prescribed until ALL of it is gone, but stop if you develop a rash or swelling and contact our office as soon as possible. Prescriptions: CIPROFLOXACIN HCL 500 MG TABS (CIPROFLOXACIN HCL) one twice daily  #14 x 0   Entered and Authorized by:   Gordy Savers  MD   Signed by:   Gordy Savers  MD on 12/03/2008   Method used:   Print then Give to Patient   RxID:   [id number] CIPROFLOXACIN HCL 500 MG  TABS (CIPROFLOXACIN HCL) one twice daily  #14 x 0   Entered and Authorized by:   Gordy Savers  MD   Signed by:   Gordy Savers  MD on 12/03/2008   Method used:   Electronically to        [facility] [address] [id number] (retail)       [address]       [place], [place]  [address]       Ph: [phone] or [phone]       Fax: [phone]   RxID:   [id number]   Laboratory Results  Comments: medicated, can't read.

## 2010-10-04 NOTE — Assessment & Plan Note (Signed)
Summary: 1 month rov/njr rsc per pt/njr   Vital Signs:  Patient Profile:   60 Years Old Female Height:     66 inches Weight:      229 pounds Temp:     98.5 degrees F oral Pulse rate:   80 / minute Resp:     14 per minute BP sitting:   110 / 80  (left arm)  Vitals Entered By: Willy Eddy, LPN (August 06, 2008 11:50 AM)                 Chief Complaint:  roa labs--c/o arthritis pain all over.  History of Present Illness: Arthritic pain, no cholestreol med now due to pain, but the arthritic pain persists in hip and multiple joints Had MRI and was refered for physical therapy... 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        Positive major cardiovascular risk factors include female age 48 years old or older, hyperlipidemia, and hypertension.  Negative major cardiovascular risk factors include no history of diabetes, negative family history for ischemic heart disease, and non-tobacco-user status.        Further assessment for target organ damage reveals no history of ASHD, stroke/TIA, or peripheral vascular disease.       Prior Medication List:  MICARDIS HCT 80-25 MG TABS (TELMISARTAN-HCTZ) 1/2 once daily PRILOSEC 20 MG CPDR (OMEPRAZOLE) 2 once daily ZOCOR 20 MG  TABS (SIMVASTATIN) once daily CYMBALTA 60 MG  CPEP (DULOXETINE  HCL) once daily MOBIC 15 MG TABS (MELOXICAM) 1 once daily   Current Allergies (reviewed today): ! PCN  Past Medical History:    Reviewed history from 07/05/2007 and no changes required:       Uterine Fibroids       Abn. Pap, hx of       Anemia-NOS       GERD       Hyperlipidemia       Hypertension       Osteoarthritis  Past Surgical History:    Reviewed history from 05/08/2007 and no changes required:       Wisdom Teeth Extracted       Laporoscopy       Tonsillectomy   Family History:    Reviewed history from 05/08/2007 and no changes required:       Family History of Arthritis       Family History Breast cancer 1st degree relative <50       Family History Depression       Family History Hypertension       Family History Psychiatric care       sister with bilateral THR  Social History:    Reviewed history from 05/08/2007 and no changes required:       Occupation:       Married       Never Smoked       Alcohol use-yes   Risk Factors: Tobacco use:  never  Alcohol use:  yes  Family History Risk Factors:    Family History of MI in females < 39 years old:  no    Family History of MI in males < 40 years old:  no   Review of Systems  The patient denies anorexia, fever, weight loss, weight gain, vision loss, decreased hearing, hoarseness, chest pain, syncope, dyspnea on exertion, peripheral edema, prolonged cough, headaches, hemoptysis, abdominal pain, melena, hematochezia, severe indigestion/heartburn, hematuria, incontinence, genital sores, muscle weakness, suspicious skin lesions, transient blindness, difficulty walking, depression, unusual weight change, abnormal bleeding, enlarged lymph nodes, angioedema, breast masses, and testicular masses.     Physical Exam  General:     alert and overweight-appearing.   Head:     normocephalic and atraumatic.   Ears:     External ear exam shows no significant lesions or deformities.  Otoscopic examination reveals clear  canals, tympanic membranes are intact bilaterally without bulging, retraction, inflammation or discharge. Hearing is grossly normal bilaterally. Nose:     no external deformity and no nasal discharge.   Mouth:     Oral mucosa and oropharynx without lesions or exudates.  Teeth in good repair. Neck:     No deformities, masses, or tenderness noted. Lungs:     normal respiratory effort and no wheezes.   Heart:     normal rate and regular rhythm.   Abdomen:     soft and non-tender.   Msk:     decreased ROM, joint tenderness, and joint swelling.   Pulses:     R and L carotid,radial,femoral,dorsalis pedis and posterior tibial pulses are full and equal bilaterally Extremities:     No clubbing, cyanosis, edema, or deformity noted with normal full range of motion of all joints.   Neurologic:     alert & oriented X3 and DTRs symmetrical and normal.      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 One by mouth bid Discussed use of moist heat or ice, modified activities, medications, and stretching/strengthening exercises. Back care instructions given. To be seen in 2 weeks if no improvement; sooner if worsening of symptoms. sister has THR Discussed use of moist heat or ice, modified activities, medications, and stretching/strengthening exercises. Back care instructions given. To be seen in 2 weeks if no improvement; sooner if worsening of symptoms.   Problem # 2:  GERD (ICD-530.81)  Her updated medication list for this problem includes:    Prilosec 20 Mg Cpdr (Omeprazole) .Marland Kitchen... 2 once daily  Diagnostics Reviewed:  Discussed lifestyle modifications, diet, antacids/medications, and preventive measures. Handout provided.    Problem # 3:  HYPERLIPIDEMIA (ICD-272.4) stopped the zocor The following medications were removed from the medication list:    Zocor 20 Mg Tabs (Simvastatin) ..... Once daily  Labs Reviewed: Chol: 247 (07/16/2008)   HDL: 46.3 (07/16/2008)   LDL: 163.2 (07/16/2008)   TG: 126 (07/16/2008) SGOT: 26 (07/16/2008)   SGPT: 27 (07/16/2008)  Lipid Goals: Chol Goal: 200 (07/05/2007)   HDL Goal: 40 (07/05/2007)   LDL Goal: 160 (07/05/2007)   TG Goal: 150 (07/05/2007)  10 Yr Risk Heart Disease: 13 % Prior 10 Yr Risk Heart Disease: 9 % (07/05/2007)   Complete Medication List: 1)  Micardis Hct 80-25 Mg  Tabs (Telmisartan-hctz) .... 1/2 once daily 2)  Prilosec 20 Mg Cpdr (Omeprazole) .... 2 once daily 3)  Cymbalta 60 Mg Cpep (Duloxetine hcl) .... Once daily 4)  Etodolac 300 Mg Caps (Etodolac) .... One by mouth bid 5)  Fish Oil Concentrate 1000 Mg Caps (Omega-3 fatty acids) .... Two caps by mouth bid  Hypertension Assessment/Plan:      The patient's hypertensive risk group is category B: At least one risk factor (excluding diabetes) with no target organ damage.  Her calculated 10 year risk of coronary heart disease is 13 %.  Today's blood pressure is 110/80.  Her blood pressure goal is < 140/90.   Patient Instructions: 1)  Please schedule a follow-up appointment in 2 months. 2)  Hepatic Panel prior to visit, ICD-9:  992.50 3)  Lipid Panel prior to visit, ICD-9:272.50   Prescriptions: ETODOLAC 300 MG CAPS (ETODOLAC) one by mouth BID  #60 x 11   Entered and Authorized by:   Stacie Glaze MD   Signed by:   Stacie Glaze MD on 08/06/2008   Method used:   Electronically to        [facility] [phone]* (retail)       [address]       [place], [place]  [address]       Ph: [phone] or [phone]       Fax: [phone]   RxID:   [id number]  ]

## 2010-10-04 NOTE — Progress Notes (Signed)
Summary: Phone note  Phone Note Call from Patient   Summary of Call: Patient states she recently had hip surgery and is at home in the bed. Patient c/o frequent urination, urgency and pressure x 2 days. Patient also c/o yeast infection on her thighs that will not go away. Patient has tried to keep it clean and dry and tries to let it air as much as possible. She also used neosporin on the rash with no relief. Patient would like something called to the pharmacy for UTI and rash. Pharm/[facility] Patient can be reached at [phone]. Initial call taken by: Darra Lis RMA,  February 03, 2009 12:10 PM  Follow-up for Phone Call        per dr Lovell Sheehan -cipro 500 two times a day for 5 days and diflucan 1`00 1 once daily for 5 days--no neopsorin on rash- use a and d ointment on rash Follow-up by: Willy Eddy, LPN,  February 04, 980 12:34 PM  Additional Follow-up for Phone Call Additional follow up Details #1::        Rx sent to pharmacy and patient informed. Additional Follow-up by: Darra Lis RMA,  February 03, 2009 12:43 PM    New/Updated Medications: CIPRO 500 MG TABS (CIPROFLOXACIN HCL) one tablet two times a day for 5 days DIFLUCAN 100 MG TABS (FLUCONAZOLE) one tablet once daily for 5 days   Prescriptions: DIFLUCAN 100 MG TABS (FLUCONAZOLE) one tablet once daily for 5 days  #5 x 0   Entered by:   Darra Lis RMA   Authorized by:   Stacie Glaze MD   Signed by:   Darra Lis RMA on 02/03/2009   Method used:   Electronically to        [facility] [phone]* (retail)       [address]       [place], [place]  [address]       Ph: [phone] or [phone]       Fax: [phone]   RxID:   [id number] CIPRO 500 MG TABS (CIPROFLOXACIN HCL) one tablet two times a day for 5 days  #10 x 0   Entered by:   Darra Lis RMA   Authorized by:   Stacie Glaze MD   Signed by:   Darra Lis RMA on 02/03/2009   Method used:    Electronically to        [facility] [phone]* (retail)       [address]       [place], [place]  [address]       Ph: [phone] or [phone]       Fax: [phone]   RxID:   [id number]

## 2010-10-04 NOTE — Procedures (Signed)
Summary: Modified Barium Swallow  MODIFIED BARIUM SWALLOW STUDY SWALLOW HISTORY  23Apr10 08:41am AC  SWALLOW HX    MBSS Outpatient                          Yes        28Apr10 08:51am AC    Therapy Diagnosis Impairment             Dysphagia  28Apr10 08:51am AC    Dx/Referral Reason/Past Medical Hx       Yes        28Apr10 08:51am AC           Comment: 57 yof referred for OPMBS secondary to complaints of           coughing with POs, primarily at end of meals; c/o sensation           of asphyxiation when this occurs.   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 Takes Prilosec.    Verbal consent obtained for MBSS         Patient    28Apr10 08:51am AC    Pt FollowsDirections/CompleteStrategies  Yes        28Apr10 08:51am AC    Dentition condition                      Adequate   28Apr10 08:51am AC    Postural control adequate for testing    Yes        28Apr10 08:51am AC    Current Diet                             Regular    28Apr10 08:51am AC    Liquids                                  Thin       28Apr10 08:51am AC    Oral Motor Evaluation                    WFL        28Apr10 08:51am AC    Pain Intensity                           0          28Apr10 08:51am AC    Penetration/Aspiration Scale Guide       Yes        28Apr10 08:51am AC           Comment: 1 = material does not enter airway           2 = material enters airway, remains ABOVE vocal cords           then ejected out           3 = material enters airway, remains ABOVE cords and not           ejected out           4 = material enters airway, CONTACTS cords then ejected           out           5 = material enters airway, CONTACTS cords and not  ejected           out           6 = material enters airway, passes BELOW cords then ejected           out           7 = material enters airway, passes BELOW cords and not           ejected out despite cough attempt by patient           8 = material enters airway, passes  BELOW cords without           attempt by patient to eject out(silent aspiration)  SWALLOW PHASE THIN/TSP  23Apr10 08:41am AC  ORAL THIN TEASPOON    WFL-TT                                   Yes        28Apr10 08:51am AC  ORAL PHARYNGEAL THIN TEASPOON  PHARYNGEAL THIN TEASPOON    Penetration/Aspiration Scale-TT          1          28Apr10 08:51am AC  SWALLOW PHASE THIN/CUP  23Apr10 08:41am AC  ORAL THIN CUP    WFL-TC                                   Yes        28Apr10 08:51am AC  ORAL PHARYNGEAL THIN CUP  PHARYNGEAL THIN CUP    Penetration/Aspiration Scale-TC          1          28Apr10 08:51am AC  SWALLOW PHASE THIN/STRAW  23Apr10 08:41am AC  ORAL THIN STRAW    WFL-TS                                   Yes        28Apr10 08:51am AC  ORAL PHARYNGEAL THIN STRAW  PHARYNGEAL THIN STRAW    Penetration/Aspiration Scale-TS          1          28Apr10 08:51am AC  SWALLOW PHASE NECTAR/TSP  23Apr10 08:41am AC  ORAL NECTAR TEASPOON    WFL-NT                                   Yes        28Apr10 08:51am AC  ORAL PHARYNGEAL NECTAR TEASPOON  PHARYNGEAL NECTAR TEASPOON    Penetration/Aspiration Scale-NT          1          28Apr10 08:51am AC  SWALLOW PHASE NECTAR/CUP  23Apr10 08:41am AC  ORAL NECTAR CUP    WFL-[place]                                   Yes        28Apr10 08:51am AC  ORAL PHARNYGEAL NECTAR CUP  PHARYNGEAL NECTAR CUP    Penetration/Aspiration Scale-          1          28Apr10  08:51am AC  SWALLOW PHASE NECTAR/STRAW  23Apr10 08:41am AC  ORAL NECTAR STRAW    WFL-NS                                   Yes        28Apr10 08:51am AC  ORAL PHARYNGEAL NECTAR STRAW  PHARYNGEAL NECTAR STRAW    Penetration/Aspiration Scale-NS          1          28Apr10 08:51am AC  SWALLOW PHASE HONEY/TSP  23Apr10 08:41am AC  ORAL HONEY TEASPOON    Not tested-HT                            Yes        28Apr10 08:52am AC  ORAL PHARYNGEAL HONEY TEASPOON  PHARYNGEAL HONEY TEASPOON  SWALLOW PHASE  HONEY/CUP  23Apr10 08:41am AC  ORAL HONEY CUP    Not tested-HC                            Yes        28Apr10 08:52am AC  ORAL PHARYNGEAL HONEY CUP  PHARYNGEAL HONEY CUP  SWALLOW PHASE PUREE  23Apr10 08:41am AC  ORAL PUREE    WFL-P                                    Yes        28Apr10 08:52am AC  ORAL PHARYNGEAL PUREE  PHARYNGEAL PUREE    Penetration/Aspiration Scale-P           1          28Apr10 08:52am AC  SWALLOW PHASE MECHANICAL SOFT  23Apr10 08:41am AC  ORAL MECHANICAL SOFT    WFL-MS                                   Yes        28Apr10 08:52am AC  ORAL PHARYNGEAL MECHANICAL SOFT  PHARYNGEAL MECHANICAL SOFT    Penetration/Aspiration Scale-MS          1          28Apr10 08:52am AC  SWALLOW PHASE MIXED  23Apr10 08:41am AC  ORAL MIXED CONSISTENCY    WFL-MC                                   Yes        28Apr10 08:52am AC  ORAL PHARYNGEAL MIXED CONSISTENCY  PHARYNGEAL MIXED CONSISTENCY    Penetration/Aspiration Scale-MC          1          28Apr10 08:52am AC  SWALLOW PHASE PILL  23Apr10 08:41am AC  ORAL PILL    Not tested-PL                            Yes        28Apr10 08:53am AC  ORAL PHARYNGEAL PILL  PHARYNGEAL PILL  SWALLOW EVALUATION  23Apr10 08:41am AC  SWALLOW EVAL    Reduced/Limited UES Relaxation  Yes        28Apr10 09:04am AC           Comment: Presence of cricopharyngeal bar    Esophageal Bolus Transit                 Yes        28Apr10 09:04am AC           Comment: esophageal sweep reveals clearance of POs  MBSS RECOMMENDATIONS    Clinical Impressions/Functional Problems Yes        28Apr10 09:04am AC           Comment: Pt with normal oropharyngeal function.  Esophageal           sweeps revealed consistent clearance of bolus material.           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           MBS did not reveal etiology of described symptoms.  Recommend           further investigation of esophageal function.    Diet recommendations                      Regular    28Apr10 09:04am AC    Liquid recommendations                   Thin       28Apr10 09:04am AC    Swallowing therapy recommended           No         28Apr10 09:04am AC    Consider esophageal work-up              Yes        28Apr10 09:04am AC  MBSS ASPIRATION PRECAUTIONS  TREATMENT PLAN AND GOALS  23Apr10 08:41am AC  MBSS TREATMENT PLAN/GOALS    Inpatient Treatment Plan N/A             Yes        28Apr10 09:04am AC  MBSS MUTUAL GOALS    Mutual Goals N/A                         Yes        28Apr10 09:04am AC  MBSS INTERVENTIONS  MBSS EDUCATION AND DISCHARGE  23Apr10 08:41am AC  MBSS PATIENT/FAMILY EDUCATION    Recommend patient receive f/u therapy at N/A        28Apr10 09:05am AC    Other MBSS education                     Yes        28Apr10 09:05am AC           Comment: discussed results and recommendations  MBSS TIME    MBSS Therapy Time In                     1130       28Apr10 09:05am AC    MBSS Therapy Time Out                    1200       28Apr10 09:05am AC    MBSS Therapy Charges/minutes             mbss       28Apr10 09:05am AC  MBSS REPORTS    MBSS Copy  to Referring Physician         Yes        28Apr10 09:05am AC    Appended Document: Modified Barium Swallow EGD/sav in [facility] Verbal order Dr Ardell Isaacs. Jones RN  Left message for patient to call back    Appended Document: Modified Barium Swallow patient will be having hip surgery in 2 weeks, and would like to schedule her EGD/Sav for July after she has recovered.  I wil send myself a flag to call her in the end of June  Appended Document: Modified Barium Swallow patient would like to delay scheduling at this time, she will call me back when she is ready to schedule and healed from her hip replacement.  Appended Document: Modified Barium Swallow OK

## 2010-10-04 NOTE — Assessment & Plan Note (Signed)
Summary: fu on labwork/njr/pt rescd//ccm   Vital Signs:  Patient profile:   60 year old female Height:      65 inches Weight:      226 pounds BMI:     37.74 Temp:     98.2 degrees F oral Pulse rate:   80 / minute Resp:     14 per minute BP sitting:   110 / 70  (left arm)  Vitals Entered By: Willy Eddy, LPN (July 08, 2009 10:03 AM) CC: roa labs   Primary Care Provider:  Darryll Capers, MD  CC:  roa labs.  History of Present Illness: Crestor three times a week with good result and tolerating weell  Hyperlipidemia Follow-Up      This is a 60 year old woman who presents for Hyperlipidemia follow-up.  The patient denies muscle aches, GI upset, abdominal pain, flushing, itching, constipation, diarrhea, and fatigue.  The patient denies the following symptoms: chest pain/pressure, exercise intolerance, dypsnea, palpitations, syncope, and pedal edema.  Compliance with medications (by patient report) has been near 100%.  Dietary compliance has been excellent.  The patient reports exercising daily.  Adjunctive measures currently used by the patient include fish oil supplements.    The pt has been to see the urologist for possible iC and was treated with cochicine and the symptoms have improved  She is on the recall for the EGD ( [facility])  Preventive Screening-Counseling & Management  Alcohol-Tobacco     Smoking Status: never  Problems Prior to Update: 1)  Preoperative Examination  (ICD-V72.84) 2)  Uti  (ICD-599.0) 3)  Dysphagia Pharyngoesophageal Phase  (ZOX-096.04) 4)  Dysphagia Oropharyngeal Phase  (VWU-981.19) 5)  Acute Cystitis  (ICD-595.0) 6)  Lumbar Radiculopathy, Right  (ICD-724.4) 7)  Osteoarthritis  (ICD-715.90) 8)  Hypertension  (ICD-401.9) 9)  Hyperlipidemia  (ICD-272.4) 10)  Family History Depression  (ICD-V17.0) 11)  Family History Breast Cancer 1st Degree Relative <50  (ICD-V16.3) 12)  Gerd  (ICD-530.81) 13)  Anemia-nos  (ICD-285.9)  Medications Prior to  Update: 1)  Micardis Hct 80-25 Mg Tabs (Telmisartan-Hctz) .... 1/2 Once Daily 2)  Cymbalta 60 Mg  Cpep (Duloxetine Hcl) .... Once Daily 3)  Crestor 10 Mg Tabs (Rosuvastatin Calcium) .... Take 10 Mg Mwf Every Week 4)  Omeprazole 20 Mg Tbec (Omeprazole) .... 2 Once Daily 5)  Vicodin 5-500 Mg Tabs (Hydrocodone-Acetaminophen) .Marland Kitchen.. 1 Three Times A Day 6)  Diflucan 100 Mg Tabs (Fluconazole) .... One Tablet Once Daily For 5 Days 7)  Zolpidem Tartrate 5 Mg Tabs (Zolpidem Tartrate) .... One By Mouth Q Hs As Needed Insomnia. 8)  Levaquin 500 Mg Tabs (Levofloxacin) .... One By Mouth Daily For 5 Days 9)  Cipro 500 Mg Tabs (Ciprofloxacin Hcl) .... One Tablet Two Times A Day For 3 Days.  Current Medications (verified): 1)  Micardis Hct 80-25 Mg Tabs (Telmisartan-Hctz) .... 1/2 Once Daily 2)  Cymbalta 60 Mg  Cpep (Duloxetine Hcl) .... Once Daily 3)  Crestor 10 Mg Tabs (Rosuvastatin Calcium) .... Take 10 Mg Mwf Every Week 4)  Omeprazole 20 Mg Tbec (Omeprazole) .... 2 Once Daily 5)  Vicodin 5-500 Mg Tabs (Hydrocodone-Acetaminophen) .Marland Kitchen.. 1 Three Times A Day 6)  Diflucan 100 Mg Tabs (Fluconazole) .... One Tablet Once Daily For 5 Days 7)  Zolpidem Tartrate 5 Mg Tabs (Zolpidem Tartrate) .... One By Mouth Q Hs As Needed Insomnia. 8)  Levaquin 500 Mg Tabs (Levofloxacin) .... One By Mouth Daily For 5 Days 9)  Cipro 500 Mg  Tabs (Ciprofloxacin Hcl) .... One Tablet Two Times A Day For 3 Days.  Allergies (verified): 1)  ! 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  Past Medical History: Reviewed history from 11/25/2008 and no changes required. Uterine Fibroids Abn. Pap, hx of Anemia GERD Hyperlipidemia Hypertension Osteoarthritis Anxiety Disorder Obesity Hiatal hernia Esophageal stricture 1994  Past Surgical History: Reviewed history from 11/25/2008 and no changes required. Wisdom Teeth Extracted Laporoscopy Tonsillectomy Hysterectomy  Family History: Reviewed history from 11/25/2008 and no changes required. Family History of Arthritis Family History Breast cancer 1st degree relative <50 Mother Family History Depression Family History Hypertension Family History Psychiatric care sister with bilateral THR Family History of Heart Disease: Father  Social History: Reviewed history from 11/25/2008 and no changes required. 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         Flu Vaccine Consent Questions     Do you have a history of severe allergic reactions to this vaccine? no    Any prior history of allergic reactions to egg and/or gelatin? no    Do you have a sensitivity to the preservative Thimersol? no    Do you have a past history of Guillan-Barre Syndrome?  no    Do you currently have an acute febrile illness? no    Have you ever had a severe reaction to latex? no    Vaccine information given and explained to patient? yes    Are you currently pregnant? no    Lot Number:[id number]   Exp Date:03/03/2010   Site Given  Left Deltoid IM  lu   Physical Exam  General:  overweight-appearing.  no distress Head:  Normocephalic and atraumatic. Eyes:  PERRLA, no icterus. Ears:  Normal auditory acuity.R ear normal and L ear normal.   Nose:  no external deformity and no nasal discharge.   Neck:  Supple; no masses or thyromegaly. Lungs:  Clear throughout to auscultation. Heart:  Regular rate and rhythm; no murmurs, rubs,  or bruits. Abdomen:  soft and non-tender.     Impression & Recommendations:  Problem # 1:  HYPERTENSION (ICD-401.9)  Her updated medication list for this problem includes:    Micardis Hct 80-25 Mg Tabs (Telmisartan-hctz) .Marland Kitchen... 1/2 once daily  BP today: 110/70 Prior BP: 130/80 (06/01/2009)  Prior 10 Yr Risk Heart Disease: 8 % (01/01/2009)  Labs Reviewed: K+: 4.5 (07/16/2008) Creat: : 0.6 (07/16/2008)   Chol: 183 (06/24/2009)   HDL: 52.40 (06/24/2009)   LDL: 114 (06/24/2009)   TG: 84.0 (06/24/2009)  Problem # 2:  HYPERLIPIDEMIA (ICD-272.4) pulse  therapy is working Her updated medication list for this problem includes:    Crestor 10 Mg Tabs (Rosuvastatin calcium) .Marland Kitchen... Take 10 mg mwf every week  Labs Reviewed: SGOT: 22 (06/24/2009)   SGPT: 23 (06/24/2009)  Lipid Goals: Chol Goal: 200 (07/05/2007)   HDL Goal: 40 (07/05/2007)   LDL Goal: 160 (07/05/2007)   TG Goal: 150 (07/05/2007)  Prior 10 Yr Risk Heart Disease: 8 % (01/01/2009)   HDL:52.40 (06/24/2009), 56.00 (12/17/2008)  LDL:114 (06/24/2009), 118 (12/17/2008)  Chol:183 (06/24/2009), 192 (12/17/2008)  Trig:84.0 (06/24/2009), 92.0 (12/17/2008)  Problem # 3:  DYSPHAGIA OROPHARYNGEAL PHASE (ICD-787.22) referral to [facility] for [facility]  Complete Medication List: 1)   Micardis Hct 80-25 Mg Tabs (Telmisartan-hctz) .... 1/2 once daily 2)  Cymbalta 60 Mg Cpep (Duloxetine hcl) .... Once daily 3)  Crestor 10 Mg Tabs (Rosuvastatin calcium) .... Take 10 mg mwf every week 4)  Omeprazole 20 Mg Tbec (Omeprazole) .... 2 once daily 5)  Zolpidem Tartrate 5 Mg Tabs (Zolpidem tartrate) .... One by mouth q hs as needed insomnia.  Other Orders: Admin 1st Vaccine ([address]) Flu Vaccine 29yrs + ([address])  Patient Instructions: 1)  Please schedule a follow-up appointment in 4 months.

## 2010-10-04 NOTE — Letter (Signed)
Summary: Colonoscopy Letter  [facility]  [address] [place], [place] [address]   Phone: [phone]  Fax: [phone]      Jan 07, 2010 MRN: [id number]   [facility] [address] [place], [place]  [address]   Dear [facility]. [facility],   According to your medical record, it is time for you to schedule a Colonoscopy. The American Cancer Society recommends this procedure as a method to detect early colon cancer. Patients with a family history of colon cancer, or a personal history of colon polyps or inflammatory bowel disease are at increased risk.  This letter has beeen generated based on the recommendations made at the time of your procedure. If you feel that in your particular situation this may no longer apply, please contact our office.  Please call our office at [phone] to schedule this appointment or to update your records at your earliest convenience.  Thank you for cooperating with [place] to provide you with the very best care possible.   Sincerely,  Judie Petit T. Russella Dar, M.D.  [facility] [phone]

## 2010-10-04 NOTE — Assessment & Plan Note (Signed)
Summary: 1 mo rov/mm   Vital Signs:  Patient profile:   60 year old female Height:      65 inches Weight:      238 pounds BMI:     39.75 Temp:     98.2 degrees F oral Pulse rate:   76 / minute Resp:     14 per minute BP sitting:   130 / 80  (left arm) Cuff size:   large  Vitals Entered By: Willy Eddy, LPN (December 14, 2009 2:05 PM) CC: roa labs, Hypertension Management   Primary Care Provider:  Darryll Capers, MD  CC:  roa labs and Hypertension Management.  History of Present Illness: weigth loss has not been accomplished discussion of the gastric bypass will give the name of several pts to discuss pt to attend  meeting at bariatric surgery center discussion of comorbidities of lipids, HTN and severe DJD   Hypertension History:      She denies headache, chest pain, palpitations, dyspnea with exertion, orthopnea, PND, peripheral edema, visual symptoms, neurologic problems, syncope, and side effects from treatment.        Positive major cardiovascular risk factors include female age 73 years old or older, hyperlipidemia, and hypertension.  Negative major cardiovascular risk factors include no history of diabetes, negative family history for ischemic heart disease, and non-tobacco-user status.        Further assessment for target organ damage reveals no history of ASHD, stroke/TIA, or peripheral vascular disease.     Preventive Screening-Counseling & Management  Alcohol-Tobacco     Alcohol drinks/day: <1     Smoking Status: never  Problems Prior to Update: 1)  Obesity, Unspecified  (ICD-278.00) 2)  Adjustment Disorder With Depressed Mood  (ICD-309.0) 3)  Preoperative Examination  (ICD-V72.84) 4)  Uti  (ICD-599.0) 5)  Dysphagia Pharyngoesophageal Phase  (ZOX-096.04) 6)  Dysphagia Oropharyngeal Phase  (VWU-981.19) 7)  Acute Cystitis  (ICD-595.0) 8)  Lumbar Radiculopathy, Right  (ICD-724.4) 9)  Osteoarthritis  (ICD-715.90) 10)  Hypertension  (ICD-401.9) 11)   Hyperlipidemia  (ICD-272.4) 12)  Family History Depression  (ICD-V17.0) 13)  Family History Breast Cancer 1st Degree Relative <50  (ICD-V16.3) 14)  Gerd  (ICD-530.81) 15)  Anemia-nos  (ICD-285.9)  Medications Prior to Update: 1)  Micardis Hct 80-25 Mg Tabs (Telmisartan-Hctz) .... 1/2 Once Daily 2)  Cymbalta 60 Mg  Cpep (Duloxetine Hcl) .... Once Daily 3)  Crestor 10 Mg Tabs (Rosuvastatin Calcium) .... Take 10 Mg Mwf Every Week 4)  Omeprazole 20 Mg Tbec (Omeprazole) .... 2 Once Daily 5)  Aspir-Low 81 Mg Tbec (Aspirin) .Marland Kitchen.. 1 Once Daily 6)  Amphetamine-Dextroamphetamine 20 Mg Xr24h-Cap (Amphetamine-Dextroamphetamine) .... One By Mouth Daily  Current Medications (verified): 1)  Micardis Hct 80-25 Mg Tabs (Telmisartan-Hctz) .... 1/2 Once Daily 2)  Cymbalta 60 Mg  Cpep (Duloxetine Hcl) .... Once Daily 3)  Crestor 10 Mg Tabs (Rosuvastatin Calcium) .... Take 10 Mg Mwf Every Week 4)  Omeprazole 40 Mg Cpdr (Omeprazole) .Marland Kitchen.. 1 Once Daily 5)  Aspir-Low 81 Mg Tbec (Aspirin) .Marland Kitchen.. 1 Once Daily 6)  Amphetamine-Dextroamphetamine 20 Mg Xr24h-Cap (Amphetamine-Dextroamphetamine) .... One By Mouth Daily 7)  Trimethoprim 100 Mg Tabs (Trimethoprim) .... Once Daily  Allergies (verified): 1)  ! 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  Past Medical  History: Reviewed history from 11/25/2008 and no changes required. Uterine Fibroids Abn. Pap, hx of Anemia GERD Hyperlipidemia Hypertension Osteoarthritis Anxiety Disorder Obesity Hiatal hernia Esophageal stricture 1994  Past Surgical History: Reviewed history from 11/25/2008 and no changes required. Wisdom Teeth Extracted Laporoscopy Tonsillectomy Hysterectomy  Family History: Reviewed history from 11/25/2008 and no changes required. Family History of Arthritis Family History Breast cancer 1st degree relative <50 Mother Family History Depression Family History Hypertension Family History Psychiatric care sister with bilateral THR Family History of Heart Disease: Father  Social History: Reviewed history from 11/25/2008 and no changes required. 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    Physical Exam  General:  overweight-appearing.  no distress Head:  Normocephalic and atraumatic. Eyes:  PERRLA, no icterus. Ears:  R ear normal and L ear normal.   Nose:  no external deformity and no nasal discharge.   Mouth:  pharynx pink and moist and no erythema.   Neck:  supple and full ROM.   Lungs:  normal respiratory effort and no wheezes.   Heart:  normal rate and regular rhythm.   Abdomen:  soft and non-tender.   Msk:  No deformity or scoliosis noted of thoracic or lumbar spine.   Pulses:  R and L  carotid,radial,femoral,dorsalis pedis and posterior tibial pulses are full and equal bilaterally Extremities:  No clubbing, cyanosis, edema, or deformity noted with normal full range of motion of all joints.   Neurologic:  No cranial nerve deficits noted. Station and gait are normal. Plantar reflexes are down-going bilaterally. DTRs are symmetrical throughout. Sensory, motor and coordinative functions appear intact.   Impression & Recommendations:  Problem # 1:  OBESITY, UNSPECIFIED (ICD-278.00)  bariatric surgery consult  Ht: 65 (12/14/2009)   Wt: 238 (12/14/2009)   BMI: 39.75 (12/14/2009)  Problem # 2:  GERD (ICD-530.81)  Her updated medication list for this problem includes:    Omeprazole 40 Mg Cpdr (Omeprazole) .Marland Kitchen... 1 once daily  Problem # 3:  HYPERTENSION (ICD-401.9)  Her updated medication list for this problem includes:    Micardis Hct 80-25 Mg Tabs (Telmisartan-hctz) .Marland Kitchen... 1/2 once daily  BP today: 130/80 Prior BP: 130/80 (11/02/2009)  Prior 10 Yr Risk Heart Disease: 8 % (01/01/2009)  Labs Reviewed: K+: 4.5 (07/16/2008) Creat: : 0.6 (07/16/2008)   Chol: 183 (06/24/2009)   HDL: 52.40 (06/24/2009)   LDL: 114 (06/24/2009)   TG: 84.0 (06/24/2009)  Problem # 4:  ANEMIA-NOS (ICD-285.9)  Complete Medication List: 1)  Micardis Hct 80-25 Mg Tabs (Telmisartan-hctz) .... 1/2 once daily 2)  Cymbalta 60 Mg Cpep (Duloxetine hcl) .... Once daily 3)  Crestor 10 Mg Tabs (Rosuvastatin calcium) .... Take 10 mg mwf every week 4)  Omeprazole 40 Mg Cpdr (Omeprazole) .Marland Kitchen.. 1 once daily 5)  Aspir-low 81 Mg Tbec (Aspirin) .Marland Kitchen.. 1 once daily 6)  Amphetamine-dextroamphetamine 20 Mg Xr24h-cap (Amphetamine-dextroamphetamine) .... One by mouth daily 7)  Trimethoprim 100 Mg Tabs (Trimethoprim) .... Once daily  Hypertension Assessment/Plan:      The patient's hypertensive risk group is category B: At least one risk factor (excluding diabetes) with no target organ damage.  Her calculated 10 year  risk of coronary heart disease is 8 %.  Today's blood pressure is 130/80.  Her blood pressure goal is < 140/90.  Patient Instructions: 1)  Please schedule a follow-up appointment in 2 months. Prescriptions: CYMBALTA 60 MG  CPEP (DULOXETINE HCL) once daily  #30 Capsule x 6   Entered by:   Willy Eddy, LPN   Authorized by:   Stacie Glaze MD   Signed by:   Willy Eddy, LPN on 16/06/9603   Method used:   Electronically to        [facility] [address] [phone]* (retail)       [address]       [place], [place]  [address]       Ph: [phone] or [phone]       Fax: [phone]   RxID:   [id number] OMEPRAZOLE 40 MG CPDR (OMEPRAZOLE) 1 once daily  #30 x 6   Entered by:   Willy Eddy, LPN   Authorized by:   Stacie Glaze MD   Signed by:   Willy Eddy, LPN on 42/59/5638   Method used:   Electronically to        [facility] [address] [phone]* (retail)       [address]       [place], [place]  [address]       Ph: [phone] or [phone]       Fax: [phone]   RxID:   [id number]

## 2010-10-04 NOTE — Medication Information (Signed)
Summary: Coverage approval for Dextroamphetamine-Amph  Coverage approval for Dextroamphetamine-Amph   Imported By: Maryln Gottron 11/17/2009 15:29:14  _____________________________________________________________________  External Attachment:    Type:   Image     Comment:   External Document

## 2010-10-04 NOTE — Assessment & Plan Note (Signed)
Summary: UTI/dm   Vital Signs:  Patient Profile:   60 Years Old Female Height:     66 inches Temp:     98.7 degrees F oral Pulse rate:   76 / minute Resp:     14 per minute BP sitting:   130 / 80  (left arm)  Vitals Entered By: Willy Eddy, LPN (June 25, 2008 9:30 AM)                 Chief Complaint:  c/o urinary urgency and pressure and Dysuria.  History of Present Illness:  Dysuria      This is a 60 year old woman who presents with Dysuria.  The patient reports burning with urination, urinary frequency, and urgency, but denies hematuria, vaginal discharge, vaginal itching, vaginal sores, and penile discharge.  The patient denies the following associated symptoms: nausea, vomiting, fever, shaking chills, flank pain, abdominal pain, back pain, pelvic pain, and arthralgias.  Risk factors for urinary tract infection include prior antibiotics.  History is significant for recent UTI.      Current Allergies: ! PCN  Past Medical History:    Reviewed history from 07/05/2007 and no changes required:       Uterine Fibroids       Abn. Pap, hx of       Anemia-NOS       GERD       Hyperlipidemia       Hypertension       Osteoarthritis  Past Surgical History:    Reviewed history from 05/08/2007 and no changes required:       Wisdom Teeth Extracted       Laporoscopy       Tonsillectomy   Family History:    Reviewed history from 05/08/2007 and no changes required:       Family History of Arthritis       Family History Breast cancer 1st degree relative <50       Family History Depression       Family History Hypertension       Family History Psychiatric care  Social History:    Reviewed history from 05/08/2007 and no changes required:       Occupation:       Married       Never Smoked       Alcohol use-yes   Risk Factors: Tobacco use:  never Alcohol use:  yes  Family History Risk Factors:    Family History of MI in females < 61 years old:  no    Family  History of MI in males < 69 years old:  no   Review of Systems       The patient complains of hoarseness and incontinence.  The patient denies anorexia, fever, weight loss, weight gain, vision loss, decreased hearing, chest pain, syncope, dyspnea on exertion, peripheral edema, prolonged cough, headaches, hemoptysis, abdominal pain, melena, hematochezia, severe indigestion/heartburn, hematuria, genital sores, muscle weakness, suspicious skin lesions, transient blindness, difficulty walking, depression, unusual weight change, abnormal bleeding, enlarged lymph nodes, angioedema, and breast masses.     Physical Exam  General:     alert and overweight-appearing.   Head:     normocephalic and atraumatic.   Eyes:     pupils equal and pupils round.   Nose:     no external deformity and no nasal discharge.   Neck:     No deformities, masses, or tenderness noted. Lungs:     normal  respiratory effort and no wheezes.   Heart:     normal rate and regular rhythm.   Abdomen:     soft and non-tender.      Impression & Recommendations:  Problem # 1:  HYPERLIPIDEMIA (ICD-272.4) Assessment: Deteriorated schedule labs and follow up discuss weight loss and diet and medication complience Her updated medication list for this problem includes:    Zocor 20 Mg Tabs (Simvastatin) ..... Once daily  Labs Reviewed: Chol: 182 (06/26/2007)   HDL: 46.8 (06/26/2007)   LDL: 113 (06/26/2007)   TG: 109 (06/26/2007) SGOT: 28 (06/26/2007)   SGPT: 32 (06/26/2007)  Lipid Goals: Chol Goal: 200 (07/05/2007)   HDL Goal: 40 (07/05/2007)   LDL Goal: 160 (07/05/2007)   TG Goal: 150 (07/05/2007)  Prior 10 Yr Risk Heart Disease: 9 % (07/05/2007)   Problem # 2:  HYPERTENSION (ICD-401.9) Assessment: Unchanged  Her updated medication list for this problem includes:    Micardis Hct 80-25 Mg Tabs (Telmisartan-hctz) .Marland Kitchen... 1/2 once daily  BP today: 130/80 Prior BP: 120/80 (10/17/2007)  Prior 10 Yr Risk Heart  Disease: 9 % (07/05/2007)  Labs Reviewed: Chol: 182 (06/26/2007)   HDL: 46.8 (06/26/2007)   LDL: 113 (06/26/2007)   TG: 109 (06/26/2007)   Problem # 3:  ACUTE CYSTITIS (ICD-595.0) cipro 500 two times a day for 5 dyas  Encouraged to push clear liquids, get enough rest, and take acetaminophen as needed. To be seen in 10 days if no improvement, sooner if worse.  Her updated medication list for this problem includes:    Cipro 500 Mg Tabs (Ciprofloxacin hcl) .Marland Kitchen... Take one (1) by mouth twice a day   Complete Medication List: 1)  Micardis Hct 80-25 Mg Tabs (Telmisartan-hctz) .... 1/2 once daily 2)  Prilosec 20 Mg Cpdr (Omeprazole) .... 2 once daily 3)  Zocor 20 Mg Tabs (Simvastatin) .... Once daily 4)  Cymbalta 60 Mg Cpep (Duloxetine hcl) .... Once daily 5)  Mobic 15 Mg Tabs (Meloxicam) .Marland Kitchen.. 1 once daily 6)  Cipro 500 Mg Tabs (Ciprofloxacin hcl) .... Take one (1) by mouth twice a day  Other Orders: UA Dipstick w/o Micro (automated)  (81003)   Patient Instructions: 1)  Please schedule a follow-up appointment in 1 month. 2)  BMP prior to visit, ICD-9: 401.90 3)  Hepatic Panel prior to visit, ICD-9:995.20 4)  Lipid Panel prior to visit, ICD-9:272.4   ] Laboratory Results   Urine Tests   Date/Time Reported: June 25, 2008 9:44 AM   Routine Urinalysis   Color: yellow Appearance: Clear Glucose: negative   (Normal Range: Negative) Bilirubin: negative   (Normal Range: Negative) Ketone: negative   (Normal Range: Negative) Spec. Gravity: 1.025   (Normal Range: 1.003-1.035) Blood: 2+   (Normal Range: Negative) pH: 5.0   (Normal Range: 5.0-8.0) Protein: 1+   (Normal Range: Negative) Urobilinogen: 0.2   (Normal Range: 0-1) Nitrite: negative   (Normal Range: Negative) Leukocyte Esterace: 2+   (Normal Range: Negative)    Comments: Wynona Canes, CMA  June 25, 2008 9:44 AM

## 2010-10-04 NOTE — Progress Notes (Signed)
Summary: new rx  Phone Note Call from Patient Call back at Home Phone [phone] Call back at [phone]   Caller: Patient Call For: Stacie Glaze MD Summary of Call: pt needs new rx for generic adderall 20 mg Initial call taken by: Heron Sabins,  Jan 21, 2010 8:33 AM    Prescriptions: AMPHETAMINE-DEXTROAMPHETAMINE 20 MG XR24H-CAP (AMPHETAMINE-DEXTROAMPHETAMINE) one by mouth daily  #30 x 0   Entered by:   Willy Eddy, LPN   Authorized by:   Stacie Glaze MD   Signed by:   Willy Eddy, LPN on 47/82/9562   Method used:   Print then Give to Patient   RxID:   [id number] AMPHETAMINE-DEXTROAMPHETAMINE 20 MG XR24H-CAP (AMPHETAMINE-DEXTROAMPHETAMINE) one by mouth daily  #30 x 0   Entered by:   Willy Eddy, LPN   Authorized by:   Stacie Glaze MD   Signed by:   Willy Eddy, LPN on 84/13/2440   Method used:   Print then Give to Patient   RxID:   [id number]

## 2010-10-04 NOTE — Medication Information (Signed)
Summary: Coverage Approval for Omeprazole  Coverage Approval for Omeprazole   Imported By: Maryln Gottron 01/27/2010 15:02:50  _____________________________________________________________________  External Attachment:    Type:   Image     Comment:   External Document

## 2010-10-04 NOTE — Medication Information (Signed)
Summary: authorization  authorization   Imported By: Kassie Mends 05/01/2008 08:37:45  _____________________________________________________________________  External Attachment:    Type:   Image     Comment:   authorization

## 2010-10-04 NOTE — Medication Information (Signed)
Summary: Methamphetamine Hcl Approved  Methamphetamine Hcl Approved   Imported By: Maryln Gottron 03/21/2010 12:57:09  _____________________________________________________________________  External Attachment:    Type:   Image     Comment:   External Document

## 2010-10-06 NOTE — Assessment & Plan Note (Signed)
Summary: med check/refill/cjr   Vital Signs:  Patient profile:   60 year old female Height:      65 inches Weight:      236 pounds BMI:     39.41 Temp:     98.2 degrees F oral Pulse rate:   72 / minute Resp:     14 per minute BP sitting:   130 / 80  (left arm)  Vitals Entered By: Willy Eddy, LPN (September 29, 2010 9:14 AM)  Nutrition Counseling: Patient's BMI is greater than 25 and therefore counseled on weight management options. CC: roa meds review, Hypertension Management Is Patient Diabetic? No   Primary Care Provider:  Darryll Capers, MD  CC:  roa meds review and Hypertension Management.  History of Present Illness: monitering of medications the pt went to her GYN and felt that she was a nervous wreck and sweating.... the possibility of hot flashes was discussed... GYN suggested estrogen as a augment to therapy. She is on a SSRI... > GYn order oncogene for breastcancer marker. Mother got breast cnacer at age 81 ( mother was menopausal when she got breastcancer) She also has significnant MSK pain " all joints hurt" Very high emotional component. Weigh BMI greater that 35 for over 10 years   Hypertension History:      Positive major cardiovascular risk factors include female age 61 years old or older, hyperlipidemia, and hypertension.  Negative major cardiovascular risk factors include no history of diabetes, negative family history for ischemic heart disease, and non-tobacco-user status.        Further assessment for target organ damage reveals no history of ASHD, stroke/TIA, or peripheral vascular disease.     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) 4)  Preoperative Examination  (ICD-V72.84) 5)  Uti  (ICD-599.0) 6)  Dysphagia  Pharyngoesophageal Phase  (MWN-027.25) 7)  Dysphagia Oropharyngeal Phase  (ICD-787.22) 8)  Acute Cystitis  (ICD-595.0) 9)  Lumbar Radiculopathy, Right  (ICD-724.4) 10)  Osteoarthritis  (ICD-715.90) 11)  Hypertension  (ICD-401.9) 12)  Hyperlipidemia  (ICD-272.4) 13)  Family History Depression  (ICD-V17.0) 14)  Family History Breast Cancer 1st Degree Relative <50  (ICD-V16.3) 15)  Gerd  (ICD-530.81) 16)  Anemia-nos  (ICD-285.9)  Current Problems (verified): 1)  Adhd  (ICD-314.01) 2)  Obesity, Unspecified  (ICD-278.00) 3)  Adjustment Disorder With Depressed Mood  (ICD-309.0) 4)  Preoperative Examination  (ICD-V72.84) 5)  Uti  (ICD-599.0) 6)  Dysphagia Pharyngoesophageal Phase  (DGU-440.34) 7)  Dysphagia Oropharyngeal Phase  (VQQ-595.63) 8)  Acute Cystitis  (ICD-595.0) 9)  Lumbar Radiculopathy, Right  (ICD-724.4) 10)  Osteoarthritis  (ICD-715.90) 11)  Hypertension  (ICD-401.9) 12)  Hyperlipidemia  (ICD-272.4) 13)  Family History Depression  (ICD-V17.0) 14)  Family History Breast Cancer 1st Degree Relative <50  (ICD-V16.3) 15)  Gerd  (ICD-530.81) 16)  Anemia-nos  (ICD-285.9)  Medications Prior to Update: 1)  Micardis Hct 80-25 Mg Tabs (Telmisartan-Hctz) .... 1/2 Once Daily 2)  Cymbalta 60 Mg  Cpep (Duloxetine Hcl) .... Once Daily 3)  Crestor 10 Mg Tabs (Rosuvastatin Calcium) .... Take 10 Mg Mwf Every Week 4)  Omeprazole 40 Mg Cpdr (Omeprazole) .Marland Kitchen.. 1 Once Daily 5)  Trimethoprim 100 Mg Tabs (Trimethoprim) .... Once Daily 6)  Alprazolam 0.5 Mg Tabs (Alprazolam) .... One By  Mouth Q 6 Hour As Needed Anxiety 7)  Methamphetamine Hcl 5 Mg Tabs (Methamphetamine Hcl) .... One By Mouth Q Am 8)  Trimethoprim 100 Mg Tabs (Trimethoprim) .... One By Mouth Daily  Current Medications (verified): 1)  Micardis Hct 80-25 Mg Tabs (Telmisartan-Hctz) .... 1/2 Once Daily 2)  Cymbalta 60 Mg  Cpep (Duloxetine Hcl) .... Once Daily 3)  Crestor 10 Mg Tabs (Rosuvastatin Calcium) .... Take 10 Mg Mwf Every  Week 4)  Omeprazole 40 Mg Cpdr (Omeprazole) .Marland Kitchen.. 1 Once Daily 5)  Alprazolam 0.5 Mg Tabs (Alprazolam) .... One By Mouth Q 6 Hour As Needed Anxiety  Allergies (verified): 1)  ! Pcn  Past History:  Family History: Last updated: 11/25/2008 Family History of Arthritis Family History Breast cancer 1st degree relative <50 Mother Family History Depression Family History Hypertension Family History Psychiatric care sister with bilateral THR Family History of Heart Disease: Father  Social History: Last updated: 11/25/2008 Occupation:Teacher Married Never Smoked Alcohol use-yes Daily Caffeine Use -1 Illicit Drug Use - no  Risk Factors: Alcohol Use: <1 (09/29/2010)  Risk Factors: Smoking Status: never (09/29/2010)  Past medical, surgical, family and social histories (including risk factors) reviewed, and no changes noted (except as noted below).  Past Medical History: Reviewed history from 01/21/2010 and no changes required. Uterine Fibroids Abn. Pap, hx of Anemia GERD Hyperlipidemia Hypertension Osteoarthritis Anxiety Disorder Obesity Hiatal hernia Esophageal stricture 1994 frequent UTIs, sees Dr. Vonita Moss  Past Surgical History: Reviewed history from 11/25/2008 and no changes required. Wisdom Teeth Extracted Laporoscopy Tonsillectomy Hysterectomy  Family History: Reviewed history from 11/25/2008 and no changes required. Family History of Arthritis Family History Breast cancer 1st degree relative <50 Mother Family History Depression Family History Hypertension Family History Psychiatric care sister with bilateral THR Family History of Heart Disease: Father  Social History: Reviewed history from 11/25/2008 and no changes required. Occupation:Teacher Married Never Smoked Alcohol use-yes Daily Caffeine Use -1 Illicit Drug Use - no  Review of Systems  The patient denies anorexia, fever, weight loss, weight gain, vision loss, decreased hearing,  hoarseness, chest pain, syncope, dyspnea on exertion, peripheral edema, prolonged cough, headaches, hemoptysis, abdominal pain, melena, hematochezia, severe indigestion/heartburn, hematuria, incontinence, genital sores, muscle weakness, suspicious skin lesions, transient blindness, difficulty walking, depression, unusual weight change, abnormal bleeding, enlarged lymph nodes, angioedema, and breast masses.    Physical Exam  General:  Well-developed,well-nourished,in no acute distress; alert,appropriate and cooperative throughout examination Head:  Normocephalic and atraumatic. Eyes:  PERRLA, no icterus. Ears:  R ear normal and L ear normal.   Nose:  no external deformity and no nasal discharge.   Mouth:  pharynx pink and moist and no erythema.   Neck:  supple and full ROM.   Lungs:  normal respiratory effort and no wheezes.   Heart:  normal rate and regular rhythm.   Abdomen:  Bowel sounds positive,abdomen soft and non-tender without masses, organomegaly or hernias noted.   Impression & Recommendations:  Problem # 1:  ADJUSTMENT DISORDER WITH DEPRESSED MOOD (ICD-309.0) Assessment Deteriorated discussed therapy  Problem # 2:  OBESITY, UNSPECIFIED (ICD-278.00) Assessment: Unchanged  the pt has persistnant weight gain and has failed weigth watchers programs  she has been to therapy she has not considered baritric surgery  Ht: 65 (09/29/2010)   Wt: 236 (09/29/2010)   BMI: 39.41 (09/29/2010)  Orders: Surgical Referral (Surgery)  Problem # 3:  ADHD (ICD-314.01) focus of problems and problem solving is an issue  Problem # 4:  HYPERTENSION (ICD-401.9)  Her updated medication  list for this problem includes:    Micardis Hct 80-25 Mg Tabs (Telmisartan-hctz) .Marland Kitchen... 1/2 once daily  BP today: 130/80 Prior BP: 130/80 (02/18/2010)  10 Yr Risk Heart Disease: 7 % Prior 10 Yr Risk Heart Disease: 6 % (02/18/2010)  Labs Reviewed: K+: 4.5 (07/16/2008) Creat: : 0.6 (07/16/2008)   Chol: 176  (12/07/2009)   HDL: 56.20 (12/07/2009)   LDL: 95 (12/07/2009)   TG: 125.0 (12/07/2009)  Orders: Surgical Referral (Surgery)  Complete Medication List: 1)  Micardis Hct 80-25 Mg Tabs (Telmisartan-hctz) .... 1/2 once daily 2)  Cymbalta 60 Mg Cpep (Duloxetine hcl) .... Once daily 3)  Crestor 10 Mg Tabs (Rosuvastatin calcium) .... Take 10 mg mwf every week 4)  Omeprazole 40 Mg Cpdr (Omeprazole) .Marland Kitchen.. 1 once daily 5)  Alprazolam 0.5 Mg Tabs (Alprazolam) .... One by mouth q 6 hour as needed anxiety  Hypertension Assessment/Plan:      The patient's hypertensive risk group is category B: At least one risk factor (excluding diabetes) with no target organ damage.  Her calculated 10 year risk of coronary heart disease is 7 %.  Today's blood pressure is 130/80.  Her blood pressure goal is < 140/90.   Patient Instructions: 1)  referral to [facility] [place] 2)  Please schedule a follow-up appointment in 3 months. uPrescriptions: CYMBALTA 60 MG  CPEP (DULOXETINE HCL) once daily  #30 Capsule x 6   Entered by:   Willy Eddy, LPN   Authorized by:   Stacie Glaze MD   Signed by:   Willy Eddy, LPN on 81/19/1478   Method used:   Electronically to        [facility] [phone]* (retail)       [address]       [place], [place]  [address]       Ph: [phone] or [phone]       Fax: [phone]   RxID:   [id number]    Orders Added: 1)  Surgical Referral [Surgery] 2)  Est. Patient Level IV [33295]

## 2010-12-07 ENCOUNTER — Encounter: Payer: Self-pay | Admitting: Internal Medicine

## 2010-12-13 LAB — BASIC METABOLIC PANEL
BUN: 3 mg/dL — ABNORMAL LOW (ref 6–23)
BUN: 5 mg/dL — ABNORMAL LOW (ref 6–23)
BUN: 5 mg/dL — ABNORMAL LOW (ref 6–23)
BUN: 6 mg/dL (ref 6–23)
BUN: 10 mg/dL (ref 6–23)
BUN: 7 mg/dL (ref 6–23)
CO2: 27 mEq/L (ref 19–32)
CO2: 28 mEq/L (ref 19–32)
CO2: 28 mEq/L (ref 19–32)
CO2: 28 mEq/L (ref 19–32)
CO2: 29 mEq/L (ref 19–32)
CO2: 29 mEq/L (ref 19–32)
Calcium: 7.6 mg/dL — ABNORMAL LOW (ref 8.4–10.5)
Calcium: 7.7 mg/dL — ABNORMAL LOW (ref 8.4–10.5)
Calcium: 8.1 mg/dL — ABNORMAL LOW (ref 8.4–10.5)
Calcium: 8.2 mg/dL — ABNORMAL LOW (ref 8.4–10.5)
Calcium: 8 mg/dL — ABNORMAL LOW (ref 8.4–10.5)
Calcium: 8.5 mg/dL (ref 8.4–10.5)
Chloride: 102 mEq/L (ref 96–112)
Chloride: 103 mEq/L (ref 96–112)
Chloride: 106 mEq/L (ref 96–112)
Chloride: 106 mEq/L (ref 96–112)
Chloride: 108 mEq/L (ref 96–112)
Chloride: 110 mEq/L (ref 96–112)
Creatinine, Ser: 0.63 mg/dL (ref 0.4–1.2)
Creatinine, Ser: 0.65 mg/dL (ref 0.4–1.2)
Creatinine, Ser: 0.65 mg/dL (ref 0.4–1.2)
Creatinine, Ser: 0.79 mg/dL (ref 0.4–1.2)
Creatinine, Ser: 0.64 mg/dL (ref 0.4–1.2)
Creatinine, Ser: 0.7 mg/dL (ref 0.4–1.2)
GFR calc Af Amer: >60 mL/min (ref >60)
GFR calc Af Amer: >60 mL/min (ref >60)
GFR calc Af Amer: >60 mL/min (ref >60)
GFR calc Af Amer: >60 mL/min (ref >60)
GFR calc Af Amer: >60 mL/min (ref >60)
GFR calc Af Amer: >60 mL/min (ref >60)
GFR calc non Af Amer: >60 mL/min (ref >60)
GFR calc non Af Amer: >60 mL/min (ref >60)
GFR calc non Af Amer: >60 mL/min (ref >60)
GFR calc non Af Amer: >60 mL/min (ref >60)
GFR calc non Af Amer: >60 mL/min (ref >60)
GFR calc non Af Amer: >60 mL/min (ref >60)
Glucose, Bld: 118 mg/dL — ABNORMAL HIGH (ref 70–99)
Glucose, Bld: 124 mg/dL — ABNORMAL HIGH (ref 70–99)
Glucose, Bld: 126 mg/dL — ABNORMAL HIGH (ref 70–99)
Glucose, Bld: 152 mg/dL — ABNORMAL HIGH (ref 70–99)
Glucose, Bld: 122 mg/dL — ABNORMAL HIGH (ref 70–99)
Glucose, Bld: 99 mg/dL (ref 70–99)
Potassium: 2.8 mEq/L — ABNORMAL LOW (ref 3.5–5.1)
Potassium: 2.9 mEq/L — ABNORMAL LOW (ref 3.5–5.1)
Potassium: 3.4 mEq/L — ABNORMAL LOW (ref 3.5–5.1)
Potassium: 3.6 mEq/L (ref 3.5–5.1)
Potassium: 3.3 mEq/L — ABNORMAL LOW (ref 3.5–5.1)
Potassium: 3.6 mEq/L (ref 3.5–5.1)
Sodium: 135 mEq/L (ref 135–145)
Sodium: 137 mEq/L (ref 135–145)
Sodium: 137 mEq/L (ref 135–145)
Sodium: 139 mEq/L (ref 135–145)
Sodium: 142 mEq/L (ref 135–145)
Sodium: 143 mEq/L (ref 135–145)

## 2010-12-13 LAB — CBC
HCT: 26.1 % — ABNORMAL LOW (ref 36.0–46.0)
HCT: 26.6 % — ABNORMAL LOW (ref 36.0–46.0)
HCT: 27.8 % — ABNORMAL LOW (ref 36.0–46.0)
HCT: 27.3 % — ABNORMAL LOW (ref 36.0–46.0)
HCT: 38.1 % (ref 36.0–46.0)
Hemoglobin: 9 g/dL — ABNORMAL LOW (ref 12.0–15.0)
Hemoglobin: 9.1 g/dL — ABNORMAL LOW (ref 12.0–15.0)
Hemoglobin: 9.5 g/dL — ABNORMAL LOW (ref 12.0–15.0)
Hemoglobin: 13.1 g/dL (ref 12.0–15.0)
Hemoglobin: 9.3 g/dL — ABNORMAL LOW (ref 12.0–15.0)
MCHC: 34.1 g/dL (ref 30.0–36.0)
MCHC: 34.3 g/dL (ref 30.0–36.0)
MCHC: 34.4 g/dL (ref 30.0–36.0)
MCHC: 34.2 g/dL (ref 30.0–36.0)
MCHC: 34.5 g/dL (ref 30.0–36.0)
MCV: 87.7 fL (ref 78.0–100.0)
MCV: 88.1 fL (ref 78.0–100.0)
MCV: 88.3 fL (ref 78.0–100.0)
MCV: 88.1 fL (ref 78.0–100.0)
MCV: 88.3 fL (ref 78.0–100.0)
Platelets: 175 10*3/uL (ref 150–400)
Platelets: 177 10*3/uL (ref 150–400)
Platelets: 199 10*3/uL (ref 150–400)
Platelets: 198 10*3/uL (ref 150–400)
Platelets: 246 10*3/uL (ref 150–400)
RBC: 2.96 MIL/uL — ABNORMAL LOW (ref 3.87–5.11)
RBC: 3.01 MIL/uL — ABNORMAL LOW (ref 3.87–5.11)
RBC: 3.16 MIL/uL — ABNORMAL LOW (ref 3.87–5.11)
RBC: 3.1 MIL/uL — ABNORMAL LOW (ref 3.87–5.11)
RBC: 4.32 MIL/uL (ref 3.87–5.11)
RDW: 13.5 % (ref 11.5–15.5)
RDW: 13.5 % (ref 11.5–15.5)
RDW: 13.7 % (ref 11.5–15.5)
RDW: 13.6 % (ref 11.5–15.5)
RDW: 14.2 % (ref 11.5–15.5)
WBC: 6.4 10*3/uL (ref 4.0–10.5)
WBC: 7.9 10*3/uL (ref 4.0–10.5)
WBC: 9.2 10*3/uL (ref 4.0–10.5)
WBC: 5.7 10*3/uL (ref 4.0–10.5)
WBC: 7 10*3/uL (ref 4.0–10.5)

## 2010-12-13 LAB — HEMOGLOBIN AND HEMATOCRIT, BLOOD
HCT: 31 % — ABNORMAL LOW (ref 36.0–46.0)
Hemoglobin: 10.5 g/dL — ABNORMAL LOW (ref 12.0–15.0)

## 2010-12-13 LAB — URINALYSIS, ROUTINE W REFLEX MICROSCOPIC
Bilirubin Urine: NEGATIVE
Glucose, UA: NEGATIVE mg/dL
Hgb urine dipstick: NEGATIVE
Ketones, ur: NEGATIVE mg/dL
Nitrite: NEGATIVE
Protein, ur: NEGATIVE mg/dL
Specific Gravity, Urine: 1.025 (ref 1.005–1.030)
Urobilinogen, UA: 0.2 mg/dL (ref 0.0–1.0)
pH: 5.5 (ref 5.0–8.0)

## 2010-12-13 LAB — PROTIME-INR
INR: 1.3 (ref 0.00–1.49)
INR: 1.7 — ABNORMAL HIGH (ref 0.00–1.49)
INR: 2.5 — ABNORMAL HIGH (ref 0.00–1.49)
INR: 1 (ref 0.00–1.49)
INR: 1.1 (ref 0.00–1.49)
Prothrombin Time: 17 seconds — ABNORMAL HIGH (ref 11.6–15.2)
Prothrombin Time: 20.3 seconds — ABNORMAL HIGH (ref 11.6–15.2)
Prothrombin Time: 28.5 seconds — ABNORMAL HIGH (ref 11.6–15.2)
Prothrombin Time: 13.2 seconds (ref 11.6–15.2)
Prothrombin Time: 15 seconds (ref 11.6–15.2)

## 2010-12-13 LAB — COMPREHENSIVE METABOLIC PANEL
ALT: 30 U/L (ref 0–35)
AST: 29 U/L (ref 0–37)
Albumin: 4.2 g/dL (ref 3.5–5.2)
Alkaline Phosphatase: 67 U/L (ref 39–117)
BUN: 15 mg/dL (ref 6–23)
CO2: 29 mEq/L (ref 19–32)
Calcium: 9.2 mg/dL (ref 8.4–10.5)
Chloride: 102 mEq/L (ref 96–112)
Creatinine, Ser: 0.67 mg/dL (ref 0.4–1.2)
GFR calc Af Amer: >60 mL/min (ref >60)
GFR calc non Af Amer: >60 mL/min (ref >60)
Glucose, Bld: 102 mg/dL — ABNORMAL HIGH (ref 70–99)
Potassium: 4 mEq/L (ref 3.5–5.1)
Sodium: 140 mEq/L (ref 135–145)
Total Bilirubin: 1.1 mg/dL (ref 0.3–1.2)
Total Protein: 6.7 g/dL (ref 6.0–8.3)

## 2010-12-13 LAB — APTT: aPTT: 32 seconds (ref 24–37)

## 2010-12-13 LAB — TYPE AND SCREEN
ABO/RH(D): O POS
Antibody Screen: NEGATIVE

## 2010-12-13 LAB — ABO/RH: ABO/RH(D): O POS

## 2010-12-13 LAB — URINE MICROSCOPIC-ADD ON

## 2010-12-13 LAB — HEMATOCRIT: HCT: 28.9 % — ABNORMAL LOW (ref 36.0–46.0)

## 2010-12-13 LAB — HEMOGLOBIN: Hemoglobin: 9.7 g/dL — ABNORMAL LOW (ref 12.0–15.0)

## 2010-12-16 ENCOUNTER — Ambulatory Visit: Payer: Self-pay | Admitting: Internal Medicine

## 2011-01-04 ENCOUNTER — Other Ambulatory Visit: Payer: Self-pay | Admitting: Internal Medicine

## 2011-01-17 NOTE — Discharge Summary (Signed)
NAMEDALEIGH, POLLINGER                ACCOUNT NO.:  [id number]   MEDICAL RECORD NO.:  [id number]          PATIENT TYPE:  INP   LOCATION:  1611                         FACILITY[facility]  [facility]   PHYSICIAN:  Ollen Gross, M.D.    DATE OF BIRTH:  May 12, 1951   DATE OF ADMISSION:  01/15/2009  DATE OF DISCHARGE:  01/19/2009                               DISCHARGE SUMMARY   ADMISSION DIAGNOSES:  1. Osteoarthritis right hip.  2. Hypertension.  3. Hypercholesterolemia.  4. Reflux disease.   DISCHARGE DIAGNOSES:  1. 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  2. Postop hypokalemia, improving.  3. Acute blood loss anemia, did not require transfusion.  4. Hypertension.  5. Hypercholesterolemia.  6. Reflux disease.  7. Hypotension postop, improving.   PROCEDURE:  Jan 15, 2009 right total hip.   SURGEON:  Dr. Lequita [name].   ASSISTANT:  Avel Peace PA-C.   ANESTHESIA:  General.   CONSULTATIONS:  None.   BRIEF HISTORY:  The patient is a 60 year old female with severe end-  stage arthritis of the right hip, progressive worsening, pain,  dysfunction, without operative management, now presents for total hip  arthroplasty.   LABORATORY DATA:  CBC on admission hemoglobin 13.1, hematocrit 38.1,  white cell count 5.7, platelets 246,000.  PT/INR 13.2, 1.0 with PTT of  32.  Chem panel on admission all within normal limits.  Preop UA small  leukocytes, few squamous, 3 to 6 white cells.  Serial CBCs were  followed.  Hemoglobin dropped down to 9.3, came back at 9.5, then 9.1.  Last H&H was 9.0 and 26.1.  Serial protimes followed per Coumadin  protocol.  Last noted PT/INR 28.5 and 2.5.  Serial B mets were followed.  Sodium did drop down from 4.0 to 2.8, came back up to 3.6, last noted at  3.4. Electrolytes remained within normal limits.   X-rays:  Right hip film Jan 08, 2009, significant degenerate changes  right hip.  Postop and pelvis film 05/14 right hip  replacement without  complicating features.   EKG:  January 01, 2009, incomplete left bundle branch block, nonspecific  ST and T-wave changes, no change from previous EKG.   She did have a stress test done on June 12, 2006, normal stress  nuclear test.   HOSPITAL COURSE:  The patient was admitted to [facility] and taken to the OR. Underwent above-stated procedure without  complication.  The patient tolerated the procedure well. Later  transferred from recovery room to the orthopedic floor. Started on PCA  and p.o. analgesics. Hemovac  drain placed at the time of surgery was  pulled on day one. She had a little bit of lower hemoglobin at 9.3 on  day one and also was running lower pressures, which was supplemented by  fluids. We discontinued the PCA and weaned her over p.o. meds and  fluids.  Started getting up out of bed on day one. By day 2, still a  little bit better.  Potassium was still low so we put her on some  potassium oral supplements.  Dressing changed. Incision looked good.  Continued to get up with therapy, walking short distances on Saturday  and then on Sunday and then on Monday, did a little bit better with  therapy walking about 40 and 80 feet but blood pressure still a little  bit low on the lower side. Her potassium had improved though. It was  felt that she needed another day because of her low pressures and to  maintain her pressure and also help with her mobility.  She continued to  progress well on the afternoon of 5/17 and she was seen on rounds again  on 05/18. Pressure was better.  Potassium was essentially stable.  Since  she was low normal, we gave her a few more potassium supplements.  We  will recheck on an outpatient basis.  Hemoglobin stable at 9.  She is  asymptomatic with this at this point. Her pressure was better.  Pulse  was good, asymptomatic.  She was placed on an iron supplement and was  discharged home.   DISPOSITION:  The  patient discharged home on 01/19/09.   DISCHARGE MEDICATIONS:  Coumadin, Nu-Iron, Robaxin, Percocet.   DIET:  As tolerated.   FOLLOWUP:  In 2 weeks.   ACTIVITY:  She is partial weightbearing right lower extremity with 25%  to 50% hip precautions per protocol.   CONDITION ON DISCHARGE:  Improved.      Alexzandrew L. Perkins, P.A.C.      Ollen Gross, M.D.  Electronically Signed    ALP/MEDQ  D:  01/19/2009  T:  01/19/2009  Job:  [id number]   cc:   Stacie Glaze, MD  [address] [place]  [place] [address]   Lum Babe MD

## 2011-01-17 NOTE — Op Note (Signed)
[name], [name]                ACCOUNT NO.:  [id number]   MEDICAL RECORD NO.:  [id number]          PATIENT TYPE:  INP   LOCATION:  0009                         FACILITY:  [facility]   PHYSICIAN:  Ollen Gross, M.D.    DATE OF BIRTH:  08/23/1951   DATE OF PROCEDURE:  01/15/2009  DATE OF DISCHARGE:                               OPERATIVE REPORT   PREOPERATIVE DIAGNOSES:  Osteoarthritis, right hip.   POSTOPERATIVE DIAGNOSES:  Osteoarthritis, right hip.   PROCEDURE:  Right total hip arthroplasty.   SURGEON:  Ollen Gross, M.D.   ASSISTANT:  Alexzandrew L. Perkins, P.A.C.   ANESTHESIA:  General.   ESTIMATED BLOOD LOSS:  400 mL.   DRAINS:  Hemovac x1.   COMPLICATIONS:  None.   CONDITION:  Stable.   CLINICAL NOTE/INDICATIONS:  Kristin [name] is a 60 year old female who has severe  end-stage arthritis of the right hip, with progressively- worsening pain  and dysfunction.  She has failed nonoperative management and presents  now for a right total hip arthroplasty.   PROCEDURE IN DETAIL:  After the successful administration of general  anesthetic, the patient is placed in the left lateral decubitus  position, with the right side up, and held with the hip positioner.  The  right lower extremity is isolated from her perineum with plastic drapes.  She is prepped and draped in the usual sterile fashion.  A short  posterolateral incision is made with the #10 blade through the  subcutaneous tissue, to the level of the fascia lata, which was incised  in line with the skin incision.  The sciatic nerve was palpated and  protected, and he short external rotators isolated off the femur.  A  capsulectomy is performed and the hip was dislocated.  The center of the  femoral head is marked and a trial prosthesis was placed, such that the  center of the trial head corresponds to the center of her native femoral  head.  Osteotomy line is marked on the femoral neck and osteotomy made  with an oscillating  saw.  The femoral head was  removed and then femoral  exposures is obtained, for preparation of the femur.   The canal finder and irrigation are passed into the femoral canal.  Axial reaming was performed, up to 13.5 mm, and then proximal reaming to  an 18-D, and the sleeve machine to a large.  The 18-D large trial sleeve  is placed.   The femur is retracted anteriorly, to gain acetabular exposure.  Acetabular retractors were placed and the labrum and osteophytes  removed.  Reaming starts at 45 mm, coursing in increments of 2 mm to 51  mm, and then a 52 mm pinnacle acetabular shell was placed in anatomic  position, and transfixed with two dome screws.  The apex hole eliminator  is placed in the permanent 36 mm neutral Ultramet and a  metal liner is  placed for metal-on-metal hip replacement.   The trial femoral components placed, which is an 18 x 13 with 36+8 neck,  matching native anteversion, a 36+0  head is placed.  This  was done  easily, so I went to 36+3, which had more appropriate soft-tissue  tension.  She has great stability, with full extension, full external  rotation, 70 degrees of flexion, 40 degrees of adduction, 90 degrees of  internal rotation, 90 degrees of flexion and 70 degrees of internal  rotation.  By placing the right leg on top of the left, it felt as  though the leg lengths were equal.  The hip is then dislocated.  The  trials removed.  The permanent 18-D large sleeve was placed with the 18  x 13 stem and 36+8  neck matching, native anteversion.  The 36+3  head  is placed and the hip is reduced, with the same stability parameters.  The wounds are copiously irrigated with saline solution and the short  rotators re-attached to the femur through drill holes, with Ethibond  suture.  The fascia lata was closed over a Hemovac drain with  interrupted #1 Vicryl.  The subcu closed with #1-0 and #2-0 Vicryl and  subcuticular running #4-0 Monocryl.  The incision was then  cleaned and  dried, and Steri-Strips and a bulky sterile dressing are applied.   She is then placed into a knee immobilizer, awakened and transported to  the recovery in stable condition.      Ollen Gross, M.D.  Electronically Signed     FA/MEDQ  D:  01/15/2009  T:  01/15/2009  Job:  [id number]

## 2011-01-17 NOTE — H&P (Signed)
Kristin [name], Kristin [name]                ACCOUNT NO.:  [id number]   MEDICAL RECORD NO.:  [id number]          PATIENT TYPE:  INP   LOCATION:  1611                         FACILITY:  [facility]   PHYSICIAN:  Ollen Gross, M.D.    DATE OF BIRTH:  Aug 13, 1951   DATE OF ADMISSION:  01/15/2009  DATE OF DISCHARGE:                              HISTORY & PHYSICAL   DATE OF OFFICE VISIT HISTORY AND PHYSICAL:  December 31, 2008.   CHIEF COMPLAINT:  Right hip pain.   HISTORY OF PRESENT ILLNESS:  The patient is a 60 year old female who has  been seen by Dr. Lequita Halt in second opinion earlier this year for ongoing  right hip pain.  She has been found in the office to have end-stage  arthritis in the right hip with bone-on-bone.  She states the pain is  progressively getting worse.  It is interfering with her functions and  activities and it is felt she would benefit from undergoing surgical  intervention.  Risks and benefits have been discussed.  She elects to  proceed with surgery.   ALLERGIES:  PENICILLIN.   CURRENT MEDICATIONS:  Prilosec, Micardis, Cymbalta, Vicodin, Crestor.   PAST MEDICAL HISTORY:  Hypertension, hypercholesterolemia, reflux  disease.   PAST SURGICAL HISTORY:  1. Hysterectomy.  2. 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  3. Also wisdom teeth extraction.   FAMILY HISTORY:  Father deceased at 16, rheumatoid arthritis, heart  disease.  Mother living age 72, bipolar disease, heart disease, thyroid  disease.   SOCIAL HISTORY:  Married, nonsmoker.  One to 2 drinks of alcohol per  day.  She has 3 children, 2 of which are adopted.  Her sister and  children will be taking care of her after surgery.  She has 1 step  entering her home.   REVIEW OF SYSTEMS:  GENERAL:  No fevers, chills, night sweats.  NEURO:  No stroke, seizure or paralysis.  RESPIRATORY:  No shortness of breath,  productive cough or hemoptysis.  CARDIOVASCULAR:  No chest pain, angina,  orthopnea.  GI:  No nausea  or vomiting, diarrhea or constipation.  GU:  No dysuria, hematuria or discharge.  MUSCULOSKELETAL:  Right hip.   PHYSICAL EXAMINATION:  VITAL SIGNS:  Pulse 92, respirations 16, blood  pressure 126/82.  GENERAL:  A 60 year old white female well-nourished, well-developed,  slightly overweight.  She is alert and cooperative, pleasant, a good  historian.  HEENT:  Normocephalic, atraumatic.  Pupils are round and reactive.  Oropharynx clear.  EOMs intact.  NECK:  Supple.  CHEST:  Clear.  HEART:  Regular rate and rhythm.  No murmur.  ABDOMEN:  Soft, nontender, round, protuberant abdomen, bowel sounds  present.  RECTAL, BREASTS, GENITALIA:  Not done, not pertinent to present illness.  EXTREMITIES:  Right hip:  Flexion 95-0, internal rotation 10 degrees,  external rotation 20 degrees abduction.  Left hip:  Normal range of  motion.  Right knee normal exam.   IMPRESSION:  Osteoarthritis, right hip.   PLAN:  The patient was admitted to [facility] to undergo a  right total replacement arthroplasty.  Surgery will be performed by Dr.  Ollen Gross.      Alexzandrew L. Perkins, P.A.C.      Ollen Gross, M.D.  Electronically Signed    ALP/MEDQ  D:  01/17/2009  T:  01/18/2009  Job:  [id number]   cc:   Stacie Glaze, MD  [address] [place]  [place] [address]   Eliberto Ivory. Rosalio Macadamia, M.D.  Fax: [phone]

## 2011-01-20 NOTE — Op Note (Signed)
NAMECLEMIE, GENERAL                ACCOUNT NO.:  [id number]   MEDICAL RECORD NO.:  [id number]          PATIENT TYPE:  OBV   LOCATION:  9309                          FACILITY:  [facility]   PHYSICIAN:  Sherry A. Dickstein, M.D.DATE OF BIRTH:  Oct 10, 1950   DATE OF PROCEDURE:  12/16/2004  DATE OF DISCHARGE:                                 OPERATIVE REPORT   PREOPERATIVE DIAGNOSIS:  Menorrhagia, fibroid uterus.   POSTOPERATIVE DIAGNOSIS:  Menorrhagia, fibroid uterus.   PROCEDURE:  Laparoscopic assisted vaginal hysterectomy with bilateral  salpingo-oophorectomy.   SURGEON:  Sherry A. Rosalio Macadamia, M.D.  Gerri Spore B. Earlene Plater, M.D.   ANESTHESIA:  General.   INDICATIONS FOR PROCEDURE:  This is a 60 year old G1, P1-0-0-1 woman who has  had known fibroid uterus for several years.  The patient has had worsening  bleeding over the past few years with increasing size uterus.  Because of  this, the patient has received several shots of Lupron and has now requested  surgical intervention.  The patient's last Lupron dose was approximately one  month ago to try to shape her uterus such that she could have an LAVH.  At  this time, the patient is brought to the operating room for a possible LAVH,  possible TAH depending on uterine size.   FINDINGS:  The uterus consistent with 12 weeks size, normal tubes and  ovaries.   PROCEDURE:  The patient was brought into the operating room.  She was placed  in a dorsal lithotomy position.  Her abdomen and vagina were washed with  Betadine.  A Foley catheter was inserted in the bladder.  She was draped in  sterile fashion.  The subumbilical area was infiltrated with 0.25% Marcaine.  An incision was made, the fascia was identified and grasped with Kocher  clamps and incised.  The fascial edges were grasped and a purse-string  stitch was taken with 0 Vicryl.  The peritoneum was identified and entered  sharply and a Hasson trocar was introduced into the peritoneal space.   A  pneumoperitoneum was created after cinching down the fascial edges with a  purse-string stitch onto the Hasson.  Lateral incisions were made after  infiltrating with 0.25% Marcaine and placement of 5 mm ports were placed  under direct visualization.  The pelvis was inspected.  It was felt that an  LAVH should be attempted.  Using the Harmonic scalpel, the right round  ligament was cauterized and severed in this fashion.  The right  infundibulopelvic ligament was identified, the ureter was found well beneath  it.  The infundibulopelvic ligament was severed with the Harmonic scalpel  along the lower edge down beneath the ovary down to the uterus.  The  anterior leaf of the broad ligament was cauterized and opened to be able to  dissect the bladder down.  The right uterine artery was well identified  without any difficulty.  This was not cauterized with the Harmonic scalpel  at this time, it was left in place.  There was some bleeding from the  uterus, itself, but this was not felt to be significant  at this time.  The  left adnexa was then visualized.  The left round ligament was cauterized in  a similar fashion.  It was more difficult to visualize because of the  anatomy.  The left IP was visualized above the ureter and it was cauterized  using the Harmonic scalpel beneath the ovary and above the ovary to the  utero-ovarian ligament.  This was done for more mobility around the enlarged  uterus.  The anterior leaf of the broad ligament was also cauterized with  the Harmonic scalpel and laid open such that the anterior leaf of the broad  ligament and the anterior bladder flap was completely developed in this  fashion.  Once some adhesions were dissected free, there was some bleeding  from the left infundibulopelvic ligament.  This was cauterized using  Kleppingers.  Adequate hemostasis was present.  At this time, the Harmonic  scalpel was then used to close to the ovary with adequate  hemostasis and  adequate excision away from the ovary.  At this time, it was felt that with  adequate hemostasis, that the vaginal portion of the surgery could be  performed.   The patient was put in a Trendelenburg position with the legs well elevated.  The speculum was placed in the vagina.  The cervix was grasped with [place]  tenaculums.  The cervix was infiltrated with 1% Xylocaine with epinephrine  circumferentially.  The cervix was incised circumferentially.  The bladder  was developed off the cervix bluntly.  The posterior cul-de-sac was  identified and entered sharply.  The incision was extended laterally.  The  posterior cuff was closed using 0 Vicryl in a running locked stitch.  Uterosacral ligaments were clamped and cut and sutured with 0 Vicryl  sutures.  The cardinal ligaments were clamped with Broadus John sure ties x 2 and  cut.  The bladder flap was developed with blunt dissection and the retractor  was able to be placed within the space to meet the upper dissection.  Then,  on alternating sides, the Broadus John sure was used to clamp, cauterize, and cut,  each cautery was 2-3 times prior to cutting, until all vasculature had been  cauterized.  At this time, because the uterus was so large, the cervix and  uterus had to be removed in pieces.  The right hand side of the cervix was  cut and removed, the internal portion of the uterus was removed in pieces.  The fibroids were removed as in doing myomectomies.  The entire uterus was  removed in this fashion until there was only a small portion of the uterus  still attached.  These areas were clamped, cut, and suture ligated with 0  Vicryl Liga sure or cauterized.  This entire process took approximately one  hour to remove the uterus in pieces.  The areas of the cardinal ligaments  then were very carefully inspected.  There was some bleeding still present. This was stopped using 0 Vicryl in figure-of-eight stitches.  The pelvis was   inspected, the posterior cuff small areas were bleeding and this was closed  with 0 Vicryl figure-of-eight stitches.  The vaginal cuff was then closed  with 0 Vicryl in figure-of-eight stitches.  There was a large amount of  fluid coming down from the abdomen.  To assure that there was no bladder  injury, the patient was given indigo carmine and the urine turned blue with  no blue dye present in the operating field.  At this time,  it was felt that  all the fluid coming down into the operating field was just from the fluid  that was used during the laparoscopic case.  At this time, it was decided to  look abdominally to make sure there was no bleeding present.   While the patient was being prepared for the abdominal case, the pelvis was  reinspected, initially having felt that the entire vaginal cuff had been  closed, however, on careful inspection, it was felt that there was still an  approximately 1 cm defect.  It was decided that this would be closed at the  end of the case.  Wet packs were placed in the pelvis to be able to close  off that defect so that the abdominal pneumoperitoneum could be re-  established.  A wet, green towel was also placed in the vagina.  The  surgeons gown and gloves were changed.  The patient's legs were lowered.  The laparoscope was replaced into the abdominal cavity.  Pneumoperitoneum  was re-instituted.  The pelvis was inspected, all irrigation was removed.  There is no significant bleeding present in the abdominal cavity.  All  pedicles were adequately hemostatic.  Pictures were obtained.  The upper  abdomen was inspected.  There was a portion of the fallopian tube from the  left side that had been removed separately during the procedure and this was  removed through the umbilical port.  The pelvis and upper abdomen was  inspected and adequate hemostasis was present.  All carbon dioxide was  allowed to escape.  All ports were removed.  The upper abdominal  incision  was first closed using the 0 Vicryl suture, carefully closing it around a  finger to not include any bowel in the closure.  The skin incisions on all  four incisions were closed with 4-0 Monocryl in subcuticular running  stitches.  The skin was closed with Dermabond.  The vaginal cuff was  reinspected by placing a speculum in the vagina and two more figure-of-eight  stitches were taken with 0 Vicryl in figure-of-eight stitches.  Adequate  hemostasis was present.  There was no  bleeding present in the vagina.  The patient was taken out of the dorsal  lithotomy position.  She was awakened, extubated, and moved from the  operating room table to a stretcher in stable condition.  Complications were  none.  Estimated blood loss 400 mL.  Specimens were uterus, tubes, and  ovaries.      SAD/MEDQ  D:  12/16/2004  T:  12/16/2004  Job:  [id number]

## 2011-02-19 ENCOUNTER — Other Ambulatory Visit: Payer: Self-pay | Admitting: Internal Medicine

## 2011-02-21 ENCOUNTER — Ambulatory Visit: Payer: Self-pay | Admitting: Internal Medicine

## 2011-02-24 ENCOUNTER — Other Ambulatory Visit: Payer: Self-pay | Admitting: *Deleted

## 2011-02-26 ENCOUNTER — Other Ambulatory Visit: Payer: Self-pay | Admitting: Internal Medicine

## 2011-03-30 ENCOUNTER — Telehealth: Payer: Self-pay

## 2011-03-30 NOTE — Telephone Encounter (Signed)
Left another message for patient x 2 to schedule recall Upper Endoscopy/ Colonoscopy. Will mail letter to patient.

## 2011-04-28 ENCOUNTER — Other Ambulatory Visit: Payer: Self-pay | Admitting: Internal Medicine

## 2011-05-18 ENCOUNTER — Encounter: Payer: Self-pay | Admitting: Internal Medicine

## 2011-05-18 ENCOUNTER — Ambulatory Visit (INDEPENDENT_AMBULATORY_CARE_PROVIDER_SITE_OTHER): Payer: BC Managed Care – PPO | Admitting: Internal Medicine

## 2011-05-18 VITALS — BP 104/72 | Temp 98.2°F | Wt 234.0 lb

## 2011-05-18 DIAGNOSIS — R251 Tremor, unspecified: Secondary | ICD-10-CM

## 2011-05-18 DIAGNOSIS — R259 Unspecified abnormal involuntary movements: Secondary | ICD-10-CM

## 2011-05-18 LAB — BASIC METABOLIC PANEL
BUN: 17 mg/dL (ref 6–23)
CO2: 26 mEq/L (ref 19–32)
Calcium: 8.9 mg/dL (ref 8.4–10.5)
Chloride: 103 mEq/L (ref 96–112)
Creatinine, Ser: 0.7 mg/dL (ref 0.4–1.2)
GFR: 93.49 mL/min (ref >60.00)
Glucose, Bld: 133 mg/dL — ABNORMAL HIGH (ref 70–99)
Potassium: 3.2 mEq/L — ABNORMAL LOW (ref 3.5–5.1)
Sodium: 140 mEq/L (ref 135–145)

## 2011-05-18 LAB — T4, FREE: Free T4: 0.64 ng/dL (ref 0.60–1.60)

## 2011-05-18 LAB — TSH: TSH: 1.08 u[IU]/mL (ref 0.35–5.50)

## 2011-05-18 MED ORDER — METOPROLOL TARTRATE 25 MG PO TABS: 12.5000 mg | ORAL_TABLET | Freq: Two times a day (BID) | ORAL | Status: DC

## 2011-05-18 NOTE — Progress Notes (Signed)
Subjective:    Patient ID: Kristin [name], female    DOB: 1950-12-03, 60 y.o.   MRN: [id number]  HPI  60 y/o white female presents with complains of tremor.   Initially she notes chronic mild left hand tremor for years but now her symptoms seem to be getting worse.  She notes her symptoms much worse when she is in stressful situation.  She works at [employer].  She also mentions when she is stressed both hands/arms are affected.  She denies other assoc issues - gait abnormality or balance issues.  She has hx of depression.  She has been on cymbalta for years.  No recent dose change.   Review of Systems See HPI  Past Medical History  Diagnosis Date  . Fibroids   . Abnormal vaginal Pap smear   . Anemia   . GERD (gastroesophageal reflux disease)   . Hyperlipidemia   . Hypertension   . Arthritis   . Anxiety   . Obesity   . Hiatal hernia   . Esophageal stricture 1994  . Chronic UTI     sees dr Vonita Moss    History   Social History  . Marital Status: Married    Spouse Name: N/A    Number of Children: N/A  . Years of Education: N/A   Occupational History  . Not on file.   Social History Main Topics  . Smoking status: Never Smoker   . Smokeless tobacco: Not on file  . Alcohol Use: Yes  . Drug Use: No  . Sexually Active: Not on file   Other Topics Concern  . Not on file   Social History Narrative  . No narrative on file    Past Surgical History  Procedure Date  . Wisdom teeth extracted   . Laporoscopy   . Tonsillectomy   . Abdominal hysterectomy     Family History  Problem Relation Age of Onset  . Arthritis    . Depression    . Hypertension    . Cancer Mother   . Heart disease Father     Allergies  Allergen Reactions  . Penicillins     Current Outpatient Prescriptions on File Prior to Visit  Medication Sig Dispense Refill  . CRESTOR 10 MG tablet take 1 tablet by mouth ON MONDAYS, WEDNESDAYS, AND FRIDAYS  15 tablet  6  . DULoxetine (CYMBALTA) 60 MG  capsule Take 60 mg by mouth daily.        Marland Kitchen MICARDIS HCT 80-25 MG per tablet take 1/2 tablet by mouth once daily  30 tablet  0  . omeprazole (PRILOSEC) 40 MG capsule take 1 capsule by mouth once daily  30 capsule  5  . ALPRAZolam (XANAX) 0.5 MG tablet Take 0.5 mg by mouth every 6 (six) hours as needed.          BP 104/72  Temp(Src) 98.2 F (36.8 C) (Oral)  Wt 234 lb (106.142 kg)      Objective:   Physical Exam   Constitutional: Appears well-developed and well-nourished. No distress.  Head: Normocephalic and atraumatic.  Neck: Normal range of motion. Neck supple. No thyromegaly present. No carotid bruit Cardiovascular: Normal rate, regular rhythm and normal heart sounds.  Exam reveals no gallop and no friction rub.   No murmur heard. Pulmonary/Chest: Effort normal and breath sounds normal.  No wheezes. No rales.  Neurological: Alert. No cranial nerve deficit. mild bilateral resting tremor.  Negative cerebellar signs.  Gait is normal.  Muscle tone  is normal.  No upper ext cog wheel rigidity. Skin: Skin is warm and dry.  Psychiatric: slightly anxious     Assessment & Plan:

## 2011-05-18 NOTE — Assessment & Plan Note (Addendum)
Pt with symptoms consistent with benign tremor.  Trial of low dose b blocker.  Her symptoms seem to be exacerbated by stress.  If no improvement, switch to propranolol. Rule out hyperthyroidism.  Check TFTs

## 2011-05-29 ENCOUNTER — Telehealth: Payer: Self-pay | Admitting: *Deleted

## 2011-05-29 MED ORDER — POTASSIUM CHLORIDE 20 MEQ PO PACK: 20.0000 meq | PACK | Freq: Every day | ORAL | Status: DC

## 2011-05-29 NOTE — Telephone Encounter (Signed)
Pt aware.

## 2011-05-29 NOTE — Telephone Encounter (Signed)
Message copied by Trenton Gammon on Mon May 29, 2011  9:34 AM ------      Message from: Simeon Craft      Created: Fri May 19, 2011 12:53 PM       Call pt - thyroid function tests are normal.  However, potassium level is tool low.  Please call in K Dur (generic) 20 meq  # 90,  One po qd.  Update med list.  Pt needs to return in one week for repeat BMET.   Use hypokalemia code.

## 2011-05-30 ENCOUNTER — Telehealth: Payer: Self-pay | Admitting: Internal Medicine

## 2011-05-30 NOTE — Telephone Encounter (Signed)
Pt saw dr Artist Pais yesterday, and [facility] had sent a rx for Potassium. Has ? About the dosage. Please return call. Thanks.

## 2011-05-31 NOTE — Telephone Encounter (Signed)
Needed to know if they could give tablets, electronic rx was sent for packets.  Okayed for tablets

## 2011-06-15 ENCOUNTER — Other Ambulatory Visit: Payer: Self-pay | Admitting: Internal Medicine

## 2011-06-19 ENCOUNTER — Ambulatory Visit: Payer: BC Managed Care – PPO | Admitting: Internal Medicine

## 2011-07-13 ENCOUNTER — Other Ambulatory Visit: Payer: Self-pay | Admitting: Internal Medicine

## 2011-08-02 ENCOUNTER — Other Ambulatory Visit: Payer: Self-pay | Admitting: *Deleted

## 2011-08-02 MED ORDER — METOPROLOL TARTRATE 25 MG PO TABS: 12.5000 mg | ORAL_TABLET | Freq: Two times a day (BID) | ORAL | Status: DC

## 2011-08-19 ENCOUNTER — Other Ambulatory Visit: Payer: Self-pay | Admitting: Internal Medicine

## 2011-08-20 ENCOUNTER — Other Ambulatory Visit: Payer: Self-pay | Admitting: Internal Medicine

## 2011-10-08 ENCOUNTER — Other Ambulatory Visit: Payer: Self-pay | Admitting: Internal Medicine

## 2011-10-23 ENCOUNTER — Other Ambulatory Visit: Payer: Self-pay | Admitting: Internal Medicine

## 2011-12-11 ENCOUNTER — Other Ambulatory Visit: Payer: Self-pay | Admitting: Internal Medicine

## 2011-12-12 ENCOUNTER — Encounter: Payer: Self-pay | Admitting: Gastroenterology

## 2011-12-17 ENCOUNTER — Other Ambulatory Visit: Payer: Self-pay | Admitting: Internal Medicine

## 2012-02-11 ENCOUNTER — Other Ambulatory Visit: Payer: Self-pay | Admitting: Internal Medicine

## 2012-02-15 ENCOUNTER — Ambulatory Visit (INDEPENDENT_AMBULATORY_CARE_PROVIDER_SITE_OTHER): Payer: BC Managed Care – PPO | Admitting: Family Medicine

## 2012-02-15 ENCOUNTER — Encounter: Payer: Self-pay | Admitting: Family Medicine

## 2012-02-15 VITALS — BP 101/74 | HR 72 | Temp 97.7°F | Ht 65.0 in | Wt 237.4 lb

## 2012-02-15 DIAGNOSIS — R259 Unspecified abnormal involuntary movements: Secondary | ICD-10-CM

## 2012-02-15 DIAGNOSIS — K219 Gastro-esophageal reflux disease without esophagitis: Secondary | ICD-10-CM

## 2012-02-15 DIAGNOSIS — R251 Tremor, unspecified: Secondary | ICD-10-CM

## 2012-02-15 DIAGNOSIS — E785 Hyperlipidemia, unspecified: Secondary | ICD-10-CM

## 2012-02-15 DIAGNOSIS — E669 Obesity, unspecified: Secondary | ICD-10-CM

## 2012-02-15 DIAGNOSIS — L74519 Primary focal hyperhidrosis, unspecified: Secondary | ICD-10-CM

## 2012-02-15 DIAGNOSIS — F411 Generalized anxiety disorder: Secondary | ICD-10-CM

## 2012-02-15 DIAGNOSIS — Z0000 Encounter for general adult medical examination without abnormal findings: Secondary | ICD-10-CM

## 2012-02-15 DIAGNOSIS — R1314 Dysphagia, pharyngoesophageal phase: Secondary | ICD-10-CM

## 2012-02-15 DIAGNOSIS — R29898 Other symptoms and signs involving the musculoskeletal system: Secondary | ICD-10-CM

## 2012-02-15 DIAGNOSIS — F418 Other specified anxiety disorders: Secondary | ICD-10-CM

## 2012-02-15 DIAGNOSIS — F341 Dysthymic disorder: Secondary | ICD-10-CM

## 2012-02-15 DIAGNOSIS — IMO0001 Reserved for inherently not codable concepts without codable children: Secondary | ICD-10-CM

## 2012-02-15 DIAGNOSIS — F419 Anxiety disorder, unspecified: Secondary | ICD-10-CM

## 2012-02-15 DIAGNOSIS — I10 Essential (primary) hypertension: Secondary | ICD-10-CM

## 2012-02-15 DIAGNOSIS — R61 Generalized hyperhidrosis: Secondary | ICD-10-CM | POA: Insufficient documentation

## 2012-02-15 DIAGNOSIS — N390 Urinary tract infection, site not specified: Secondary | ICD-10-CM

## 2012-02-15 HISTORY — DX: Generalized hyperhidrosis: R61

## 2012-02-15 LAB — HEPATIC FUNCTION PANEL
ALT: 21 U/L (ref 0–35)
AST: 21 U/L (ref 0–37)
Albumin: 3.9 g/dL (ref 3.5–5.2)
Alkaline Phosphatase: 66 U/L (ref 39–117)
Bilirubin, Direct: 0 mg/dL (ref 0.0–0.3)
Total Bilirubin: 0.7 mg/dL (ref 0.3–1.2)
Total Protein: 6.8 g/dL (ref 6.0–8.3)

## 2012-02-15 LAB — LIPID PANEL
Cholesterol: 177 mg/dL (ref 0–200)
HDL: 54.1 mg/dL (ref >39.00)
LDL Cholesterol: 92 mg/dL (ref 0–99)
Total CHOL/HDL Ratio: 3
Triglycerides: 155 mg/dL — ABNORMAL HIGH (ref 0.0–149.0)
VLDL: 31 mg/dL (ref 0.0–40.0)

## 2012-02-15 LAB — RENAL FUNCTION PANEL
Albumin: 3.9 g/dL (ref 3.5–5.2)
BUN: 17 mg/dL (ref 6–23)
CO2: 30 mEq/L (ref 19–32)
Calcium: 9.3 mg/dL (ref 8.4–10.5)
Chloride: 106 mEq/L (ref 96–112)
Creatinine, Ser: 0.8 mg/dL (ref 0.4–1.2)
GFR: 74.09 mL/min (ref >60.00)
Glucose, Bld: 94 mg/dL (ref 70–99)
Phosphorus: 3.9 mg/dL (ref 2.3–4.6)
Potassium: 4.5 mEq/L (ref 3.5–5.1)
Sodium: 143 mEq/L (ref 135–145)

## 2012-02-15 LAB — CBC
HCT: 38.2 % (ref 36.0–46.0)
Hemoglobin: 12.8 g/dL (ref 12.0–15.0)
MCHC: 33.4 g/dL (ref 30.0–36.0)
MCV: 88.1 fl (ref 78.0–100.0)
Platelets: 232 10*3/uL (ref 150.0–400.0)
RBC: 4.34 Mil/uL (ref 3.87–5.11)
RDW: 15 % — ABNORMAL HIGH (ref 11.5–14.6)
WBC: 6.4 10*3/uL (ref 4.5–10.5)

## 2012-02-15 LAB — TSH: TSH: 0.74 u[IU]/mL (ref 0.35–5.50)

## 2012-02-15 LAB — T4, FREE: Free T4: 0.64 ng/dL (ref 0.60–1.60)

## 2012-02-15 MED ORDER — OMEPRAZOLE 40 MG PO CPDR: 40.0000 mg | DELAYED_RELEASE_CAPSULE | Freq: Every day | ORAL | Status: DC

## 2012-02-15 MED ORDER — ALPRAZOLAM 0.5 MG PO TABS: 0.5000 mg | ORAL_TABLET | Freq: Three times a day (TID) | ORAL | Status: DC | PRN

## 2012-02-15 NOTE — Assessment & Plan Note (Signed)
Well controlled on Omeprazole 40 mg daily, avoid offending foods, continue meds

## 2012-02-15 NOTE — Assessment & Plan Note (Addendum)
Worsening for roughly a year. Is already on Metoprolol. Notable family history of a father with tremors. Tremor is most notable in hands with use and she is also noting weakness in her legs over past several months.

## 2012-02-15 NOTE — Assessment & Plan Note (Signed)
No recent episodes

## 2012-02-15 NOTE — Progress Notes (Signed)
Patient ID: Kristin [name], female   DOB: 1950/12/21, 61 y.o.   MRN: [id number] Kristin [name] [id number] 1951-02-08 02/15/2012      Progress Note New Patient  Subjective  Chief Complaint  Chief Complaint  Patient presents with  . Establish Care    new patient    HPI  Patient is a 61 year old Caucasian female who is in today for new patient appointment. She has since August 2002. She also notes she's had increasing hot flashes over the last couple years to where they happen several times a day. He is also noting weakness in both legs. Does have a family history significant for a father with tremor and rheumatoid arthritis. 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 In some programs for [employer] cc of a child of education. Today she needs refill on her omeprazole and her Xanax which help her to manage her weakness and her shaking. She has no hay years several concerns. One is that she's been noting significant shaking in her hands   Past Medical History  Diagnosis Date  . Fibroids   . Abnormal vaginal Pap smear   . Anemia   . GERD (gastroesophageal reflux disease)   . Hyperlipidemia   . Hypertension   . Arthritis   . Anxiety   . Obesity   . Hiatal hernia   . Esophageal stricture 1994  . Chronic UTI     sees dr Vonita Moss  . Measles as a child  . Chicken pox as a child  . 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 Wisdom teeth extracted   . Laporoscopy   . Tonsillectomy   . Abdominal hysterectomy 2006    total  . Partial hip arthroplasty 2010    right  . Esophageal     stretching    Family History  Problem Relation Age of Onset  . Cancer Mother     breast  . Other Mother     arrythmia  . Mental illness Mother     bipolar  . Heart disease Father   .  Arthritis Father     rheumatoid  . Depression Sister   . Arthritis Sister     History   Social History  . Marital Status: Married    Spouse Name: N/A    Number of Children: N/A  . Years of Education: N/A   Occupational History  . Not on file.   Social History Main Topics  . Smoking status: Never Smoker   . Smokeless tobacco: Never Used  . Alcohol Use: Yes     4 glasses of wine weekly  . Drug Use: No  . Sexually Active: Yes -- Female partner(s)   Other Topics Concern  . Not on file   Social History Narrative  . No narrative on file    Current Outpatient Prescriptions on File Prior to Visit  Medication Sig Dispense Refill  . CRESTOR 10 MG tablet take 1 tablet by mouth ON MONDAYS, WEDNESDAYS, AND FRIDAYS  15 tablet  6  . CYMBALTA 60 MG capsule take 1 capsule by mouth once daily  30 capsule  6  . metoprolol tartrate (LOPRESSOR) 25 MG tablet TAKE 1/2 TABLET BY MOUTH 2 TIMES DAILY  30 tablet  3  . MICARDIS HCT  80-25 MG per tablet take 1/2 tablet by mouth once daily  30 tablet  0  . DISCONTD: omeprazole (PRILOSEC) 40 MG capsule take 1 capsule by mouth once daily  30 capsule  5    Allergies  Allergen Reactions  . Penicillins     Review of Systems  Review of Systems  Constitutional: Positive for malaise/fatigue and diaphoresis. Negative for fever and chills.  HENT: Negative for hearing loss, nosebleeds and congestion.   Eyes: Negative for discharge.  Respiratory: Negative for cough, sputum production, shortness of breath and wheezing.   Cardiovascular: Negative for chest pain, palpitations and leg swelling.  Gastrointestinal: Negative for heartburn, nausea, vomiting, abdominal pain, diarrhea, constipation and blood in stool.  Genitourinary: Negative for dysuria, urgency, frequency and hematuria.  Musculoskeletal: Negative for myalgias, back pain and falls.  Skin: Negative for rash.  Neurological: Positive for tremors and focal weakness. Negative for dizziness, sensory  change, loss of consciousness, weakness and headaches.  Endo/Heme/Allergies: Negative for polydipsia. Does not bruise/bleed easily.  Psychiatric/Behavioral: Positive for depression. Negative for suicidal ideas. The patient is nervous/anxious. The patient does not have insomnia.     Objective  BP 101/74  Pulse 72  Temp 97.7 F (36.5 C) (Temporal)  Ht 5\' 5"  (1.651 m)  Wt 237 lb 6.4 oz (107.684 kg)  BMI 39.51 kg/m2  SpO2 94%  Physical Exam  Physical Exam  Constitutional: She is oriented to person, place, and time and well-developed, well-nourished, and in no distress. No distress.  HENT:  Head: Normocephalic and atraumatic.  Right Ear: External ear normal.  Left Ear: External ear normal.  Nose: Nose normal.  Mouth/Throat: Oropharynx is clear and moist. No oropharyngeal exudate.  Eyes: Conjunctivae are normal. Pupils are equal, round, and reactive to light. Right eye exhibits no discharge. Left eye exhibits no discharge. No scleral icterus.  Neck: Normal range of motion. Neck supple. No thyromegaly present.  Cardiovascular: Normal rate, regular rhythm, normal heart sounds and intact distal pulses.   No murmur heard. Pulmonary/Chest: Effort normal and breath sounds normal. No respiratory distress. She has no wheezes. She has no rales.  Abdominal: Soft. Bowel sounds are normal. She exhibits no distension and no mass. There is no tenderness.  Musculoskeletal: Normal range of motion. She exhibits no edema and no tenderness.  Lymphadenopathy:    She has no cervical adenopathy.  Neurological: She is alert and oriented to person, place, and time. She has normal reflexes. She displays normal reflexes. No cranial nerve deficit. She exhibits normal muscle tone. Gait normal. Coordination normal.  Skin: Skin is warm and dry. No rash noted. She is not diaphoretic.  Psychiatric: Mood, memory and affect normal.       Assessment & Plan  GERD Well controlled on Omeprazole 40 mg daily, avoid  offending foods, continue meds  Tremor Worsening for roughly a year. Is already on Metoprolol. Notable family history of a father with tremors. Tremor is most notable in hands with use and she is also noting weakness in her legs over past several months.  HYPERLIPIDEMIA Mild avoid trans fats, start MegaRed continue Crestor  DYSPHAGIA PHARYNGOESOPHAGEAL PHASE Had strictures stretched once and has done well since then  HYPERTENSION Adequately controlled, no change in meds today  UTI No recent episodes  OBESITY, UNSPECIFIED Given handout on DASH diet and encouraged to increase exercise.   Depression with anxiety Patient has been on Cymbalta for over 10 years, is concerned it is not working as well and anxiety is worsening. Will  consider altering meds at next visit.

## 2012-02-15 NOTE — Assessment & Plan Note (Signed)
Mild avoid trans fats, start MegaRed continue Crestor

## 2012-02-15 NOTE — Patient Instructions (Addendum)
Preventive Care for Adults, Female A healthy lifestyle and preventive care can promote health and wellness. Preventive health guidelines for women include the following key practices.  A routine yearly physical is a good way to check with your caregiver about your health and preventive screening. It is a chance to share any concerns and updates on your health, and to receive a thorough exam.   Visit your dentist for a routine exam and preventive care every 6 months. Brush your teeth twice a day and floss once a day. Good oral hygiene prevents tooth decay and gum disease.   The frequency of eye exams is based on your age, health, family medical history, use of contact lenses, and other factors. Follow your caregiver's recommendations for frequency of eye exams.   Eat a healthy diet. Foods like vegetables, fruits, whole grains, low-fat dairy products, and lean protein foods contain the nutrients you need without too many calories. Decrease your intake of foods high in solid fats, added sugars, and salt. Eat the right amount of calories for you.Get information about a proper diet from your caregiver, if necessary.   Regular physical exercise is one of the most important things you can do for your health. Most adults should get at least 150 minutes of moderate-intensity exercise (any activity that increases your heart rate and causes you to sweat) each week. In addition, most adults need muscle-strengthening exercises on 2 or more days a week.   Maintain a healthy weight. The body mass index (BMI) is a screening tool to identify possible weight problems. It provides an estimate of body fat based on height and weight. Your caregiver can help determine your BMI, and can help you achieve or maintain a healthy weight.For adults 20 years and older:   A BMI below 18.5 is considered underweight.   A BMI of 18.5 to 24.9 is normal.   A BMI of 25 to 29.9 is considered overweight.   A BMI of 30 and above is  considered obese.   Maintain normal blood lipids and cholesterol levels by exercising and minimizing your intake of saturated fat. Eat a balanced diet with plenty of fruit and vegetables. Blood tests for lipids and cholesterol should begin at age 20 and be repeated every 5 years. If your lipid or cholesterol levels are high, you are over 50, or you are at high risk for heart disease, you may need your cholesterol levels checked more frequently.Ongoing high lipid and cholesterol levels should be treated with medicines if diet and exercise are not effective.   If you smoke, find out from your caregiver how to quit. If you do not use tobacco, do not start.   If you are pregnant, do not drink alcohol. If you are breastfeeding, be very cautious about drinking alcohol. If you are not pregnant and choose to drink alcohol, do not exceed 1 drink per day. One drink is considered to be 12 ounces (355 mL) of beer, 5 ounces (148 mL) of wine, or 1.5 ounces (44 mL) of liquor.   Avoid use of street drugs. Do not share needles with anyone. Ask for help if you need support or instructions about stopping the use of drugs.   High blood pressure causes heart disease and increases the risk of stroke. Your blood pressure should be checked at least every 1 to 2 years. Ongoing high blood pressure should be treated with medicines if weight loss and exercise are not effective.   If you are 55 to 62   years old, ask your caregiver if you should take aspirin to prevent strokes.   Diabetes screening involves taking a blood sample to check your fasting blood sugar level. This should be done once every 3 years, after age 45, if you are within normal weight and without risk factors for diabetes. Testing should be considered at a younger age or be carried out more frequently if you are overweight and have at least 1 risk factor for diabetes.   Breast cancer screening is essential preventive care for women. You should practice "breast  self-awareness." This means understanding the normal appearance and feel of your breasts and may include breast self-examination. Any changes detected, no matter how small, should be reported to a caregiver. Women in their 20s and 30s should have a clinical breast exam (CBE) by a caregiver as part of a regular health exam every 1 to 3 years. After age 40, women should have a CBE every year. Starting at age 40, women should consider having a mammography (breast X-ray test) every year. Women who have a family history of breast cancer should talk to their caregiver about genetic screening. Women at a high risk of breast cancer should talk to their caregivers about having magnetic resonance imaging (MRI) and a mammography every year.   The Pap test is a screening test for cervical cancer. A Pap test can show cell changes on the cervix that might become cervical cancer if left untreated. A Pap test is a procedure in which cells are obtained and examined from the lower end of the uterus (cervix).   Women should have a Pap test starting at age 21.   Between ages 21 and 29, Pap tests should be repeated every 2 years.   Beginning at age 30, you should have a Pap test every 3 years as long as the past 3 Pap tests have been normal.   Some women have medical problems that increase the chance of getting cervical cancer. Talk to your caregiver about these problems. It is especially important to talk to your caregiver if a new problem develops soon after your last Pap test. In these cases, your caregiver may recommend more frequent screening and Pap tests.   The above recommendations are the same for women who have or have not gotten the vaccine for human papillomavirus (HPV).   If you had a hysterectomy for a problem that was not cancer or a condition that could lead to cancer, then you no longer need Pap tests. Even if you no longer need a Pap test, a regular exam is a good idea to make sure no other problems are  starting.   If you are between ages 65 and 70, and you have had normal Pap tests going back 10 years, you no longer need Pap tests. Even if you no longer need a Pap test, a regular exam is a good idea to make sure no other problems are starting.   If you have had past treatment for cervical cancer or a condition that could lead to cancer, you need Pap tests and screening for cancer for at least 20 years after your treatment.   If Pap tests have been discontinued, risk factors (such as a new sexual partner) need to be reassessed to determine if screening should be resumed.   The HPV test is an additional test that may be used for cervical cancer screening. The HPV test looks for the virus that can cause the cell changes on the cervix.   The cells collected during the Pap test can be tested for HPV. The HPV test could be used to screen women aged 30 years and older, and should be used in women of any age who have unclear Pap test results. After the age of 30, women should have HPV testing at the same frequency as a Pap test.   Colorectal cancer can be detected and often prevented. Most routine colorectal cancer screening begins at the age of 50 and continues through age 75. However, your caregiver may recommend screening at an earlier age if you have risk factors for colon cancer. On a yearly basis, your caregiver may provide home test kits to check for hidden blood in the stool. Use of a small camera at the end of a tube, to directly examine the colon (sigmoidoscopy or colonoscopy), can detect the earliest forms of colorectal cancer. Talk to your caregiver about this at age 50, when routine screening begins. Direct examination of the colon should be repeated every 5 to 10 years through age 75, unless early forms of pre-cancerous polyps or small growths are found.   Hepatitis C blood testing is recommended for all people born from 1945 through 1965 and any individual with known risks for hepatitis C.    Practice safe sex. Use condoms and avoid high-risk sexual practices to reduce the spread of sexually transmitted infections (STIs). STIs include gonorrhea, chlamydia, syphilis, trichomonas, herpes, HPV, and human immunodeficiency virus (HIV). Herpes, HIV, and HPV are viral illnesses that have no cure. They can result in disability, cancer, and death. Sexually active women aged 25 and younger should be checked for chlamydia. Older women with new or multiple partners should also be tested for chlamydia. Testing for other STIs is recommended if you are sexually active and at increased risk.   Osteoporosis is a disease in which the bones lose minerals and strength with aging. This can result in serious bone fractures. The risk of osteoporosis can be identified using a bone density scan. Women ages 65 and over and women at risk for fractures or osteoporosis should discuss screening with their caregivers. Ask your caregiver whether you should take a calcium supplement or vitamin D to reduce the rate of osteoporosis.   Menopause can be associated with physical symptoms and risks. Hormone replacement therapy is available to decrease symptoms and risks. You should talk to your caregiver about whether hormone replacement therapy is right for you.   Use sunscreen with sun protection factor (SPF) of 30 or more. Apply sunscreen liberally and repeatedly throughout the day. You should seek shade when your shadow is shorter than you. Protect yourself by wearing long sleeves, pants, a wide-brimmed hat, and sunglasses year round, whenever you are outdoors.   Once a month, do a whole body skin exam, using a mirror to look at the skin on your back. Notify your caregiver of new moles, moles that have irregular borders, moles that are larger than a pencil eraser, or moles that have changed in shape or color.   Stay current with required immunizations.   Influenza. You need a dose every fall (or winter). The composition of  the flu vaccine changes each year, so being vaccinated once is not enough.   Pneumococcal polysaccharide. You need 1 to 2 doses if you smoke cigarettes or if you have certain chronic medical conditions. You need 1 dose at age 65 (or older) if you have never been vaccinated.   Tetanus, diphtheria, pertussis (Tdap, Td). Get 1 dose of   Tdap vaccine if you are younger than age 65, are over 65 and have contact with an infant, are a healthcare worker, are pregnant, or simply want to be protected from whooping cough. After that, you need a Td booster dose every 10 years. Consult your caregiver if you have not had at least 3 tetanus and diphtheria-containing shots sometime in your life or have a deep or dirty wound.   HPV. You need this vaccine if you are a woman age 26 or younger. The vaccine is given in 3 doses over 6 months.   Measles, mumps, rubella (MMR). You need at least 1 dose of MMR if you were born in 1957 or later. You may also need a second dose.   Meningococcal. If you are age 19 to 21 and a first-year college student living in a residence hall, or have one of several medical conditions, you need to get vaccinated against meningococcal disease. You may also need additional booster doses.   Zoster (shingles). If you are age 60 or older, you should get this vaccine.   Varicella (chickenpox). If you have never had chickenpox or you were vaccinated but received only 1 dose, talk to your caregiver to find out if you need this vaccine.   Hepatitis A. You need this vaccine if you have a specific risk factor for hepatitis A virus infection or you simply wish to be protected from this disease. The vaccine is usually given as 2 doses, 6 to 18 months apart.   Hepatitis B. You need this vaccine if you have a specific risk factor for hepatitis B virus infection or you simply wish to be protected from this disease. The vaccine is given in 3 doses, usually over 6 months.  Preventive Services /  Frequency Ages 19 to 39  Blood pressure check.** / Every 1 to 2 years.   Lipid and cholesterol check.** / Every 5 years beginning at age 20.   Clinical breast exam.** / Every 3 years for women in their 20s and 30s.   Pap test.** / Every 2 years from ages 21 through 29. Every 3 years starting at age 30 through age 65 or 70 with a history of 3 consecutive normal Pap tests.   HPV screening.** / Every 3 years from ages 30 through ages 65 to 70 with a history of 3 consecutive normal Pap tests.   Hepatitis C blood test.** / For any individual with known risks for hepatitis C.   Skin self-exam. / Monthly.   Influenza immunization.** / Every year.   Pneumococcal polysaccharide immunization.** / 1 to 2 doses if you smoke cigarettes or if you have certain chronic medical conditions.   Tetanus, diphtheria, pertussis (Tdap, Td) immunization. / A one-time dose of Tdap vaccine. After that, you need a Td booster dose every 10 years.   HPV immunization. / 3 doses over 6 months, if you are 26 and younger.   Measles, mumps, rubella (MMR) immunization. / You need at least 1 dose of MMR if you were born in 1957 or later. You may also need a second dose.   Meningococcal immunization. / 1 dose if you are age 19 to 21 and a first-year college student living in a residence hall, or have one of several medical conditions, you need to get vaccinated against meningococcal disease. You may also need additional booster doses.   Varicella immunization.** / Consult your caregiver.   Hepatitis A immunization.** / Consult your caregiver. 2 doses, 6 to 18 months   apart.   Hepatitis B immunization.** / Consult your caregiver. 3 doses usually over 6 months.  Ages 40 to 64  Blood pressure check.** / Every 1 to 2 years.   Lipid and cholesterol check.** / Every 5 years beginning at age 20.   Clinical breast exam.** / Every year after age 40.   Mammogram.** / Every year beginning at age 40 and continuing for as  long as you are in good health. Consult with your caregiver.   Pap test.** / Every 3 years starting at age 30 through age 65 or 70 with a history of 3 consecutive normal Pap tests.   HPV screening.** / Every 3 years from ages 30 through ages 65 to 70 with a history of 3 consecutive normal Pap tests.   Fecal occult blood test (FOBT) of stool. / Every year beginning at age 50 and continuing until age 75. You may not need to do this test if you get a colonoscopy every 10 years.   Flexible sigmoidoscopy or colonoscopy.** / Every 5 years for a flexible sigmoidoscopy or every 10 years for a colonoscopy beginning at age 50 and continuing until age 75.   Hepatitis C blood test.** / For all people born from 1945 through 1965 and any individual with known risks for hepatitis C.   Skin self-exam. / Monthly.   Influenza immunization.** / Every year.   Pneumococcal polysaccharide immunization.** / 1 to 2 doses if you smoke cigarettes or if you have certain chronic medical conditions.   Tetanus, diphtheria, pertussis (Tdap, Td) immunization.** / A one-time dose of Tdap vaccine. After that, you need a Td booster dose every 10 years.   Measles, mumps, rubella (MMR) immunization. / You need at least 1 dose of MMR if you were born in 1957 or later. You may also need a second dose.   Varicella immunization.** / Consult your caregiver.   Meningococcal immunization.** / Consult your caregiver.   Hepatitis A immunization.** / Consult your caregiver. 2 doses, 6 to 18 months apart.   Hepatitis B immunization.** / Consult your caregiver. 3 doses, usually over 6 months.  Ages 65 and over  Blood pressure check.** / Every 1 to 2 years.   Lipid and cholesterol check.** / Every 5 years beginning at age 20.   Clinical breast exam.** / Every year after age 40.   Mammogram.** / Every year beginning at age 40 and continuing for as long as you are in good health. Consult with your caregiver.   Pap test.** /  Every 3 years starting at age 30 through age 65 or 70 with a 3 consecutive normal Pap tests. Testing can be stopped between 65 and 70 with 3 consecutive normal Pap tests and no abnormal Pap or HPV tests in the past 10 years.   HPV screening.** / Every 3 years from ages 30 through ages 65 or 70 with a history of 3 consecutive normal Pap tests. Testing can be stopped between 65 and 70 with 3 consecutive normal Pap tests and no abnormal Pap or HPV tests in the past 10 years.   Fecal occult blood test (FOBT) of stool. / Every year beginning at age 50 and continuing until age 75. You may not need to do this test if you get a colonoscopy every 10 years.   Flexible sigmoidoscopy or colonoscopy.** / Every 5 years for a flexible sigmoidoscopy or every 10 years for a colonoscopy beginning at age 50 and continuing until age 75.   Hepatitis   C blood test.** / For all people born from [birth year] through 1965 and any individual with known risks for hepatitis C.   Osteoporosis screening.** / A one-time screening for women ages 46 and over and women at risk for fractures or osteoporosis.   Skin self-exam. / Monthly.   Influenza immunization.** / Every year.   Pneumococcal polysaccharide immunization.** / 1 dose at age 56 (or older) if you have never been vaccinated.   Tetanus, diphtheria, pertussis (Tdap, Td) immunization. / A one-time dose of Tdap vaccine if you are over 65 and have contact with an infant, are a Research scientist (physical sciences), or simply want to be protected from whooping cough. After that, you need a Td booster dose every 10 years.   Varicella immunization.** / Consult your caregiver.   Meningococcal immunization.** / Consult your caregiver.   Hepatitis A immunization.** / Consult your caregiver. 2 doses, 6 to 18 months apart.   Hepatitis B immunization.** / Check with your caregiver. 3 doses, usually over 6 months.  ** Family history and personal history of risk and conditions may change your caregiver's  recommendations. Document Released: 10/17/2001 Document Revised: 08/10/2011 Document Reviewed: 01/16/2011 [facility] Patient Information 2012 [place], [place].   Add a MegaRed cap daily (Krill oil by [employer]) Metamucil daily  Tremor Tremor is a rhythmic, involuntary muscular contraction characterized by oscillations (to-and-fro movements) of a part of the body. The most common of all involuntary movements, tremor can affect various body parts such as the hands, head, facial structures, vocal cords, trunk, and legs; most tremors, however, occur in the hands. Tremor often accompanies neurological disorders associated with aging. Although the disorder is not life-threatening, it can be responsible for functional disability and social embarrassment. TREATMENT  There are many types of tremor and several ways in which tremor is classified. The most common classification is by behavioral context or position. There are five categories of tremor within this classification: resting, postural, kinetic, task-specific, and psychogenic. Resting or static tremor occurs when the muscle is at rest, for example when the hands are lying on the lap. This type of tremor is often seen in patients with Parkinson's disease. Postural tremor occurs when a patient attempts to maintain posture, such as holding the hands outstretched. Postural tremors include physiological tremor, essential tremor, tremor with basal ganglia disease (also seen in patients with Parkinson's disease), cerebellar postural tremor, tremor with peripheral neuropathy, post-traumatic tremor, and alcoholic tremor. Kinetic or intention (action) tremor occurs during purposeful movement, for example during finger-to-nose testing. Task-specific tremor appears when performing goal-oriented tasks such as handwriting, speaking, or standing. This group consists of primary writing tremor, vocal tremor, and orthostatic tremor. Psychogenic tremor occurs in both older and  younger patients. The key feature of this tremor is that it dramatically lessens or disappears when the patient is distracted. PROGNOSIS There are some treatment options available for tremor; the appropriate treatment depends on accurate diagnosis of the cause. Some tremors respond to treatment of the underlying condition, for example in some cases of psychogenic tremor treating the patient's underlying mental problem may cause the tremor to disappear. Also, patients with tremor due to Parkinson's disease may be treated with Levodopa drug therapy. Symptomatic drug therapy is available for several other tremors as well. For those cases of tremor in which there is no effective drug treatment, physical measures such as teaching the patient to brace the affected limb during the tremor are sometimes useful. Surgical intervention such as thalamotomy or deep brain stimulation may be useful in  certain cases. Document Released: 08/11/2002 Document Revised: 08/10/2011 Document Reviewed: 08/21/2005 [facility] Patient Information 2012 [place], [place].

## 2012-02-15 NOTE — Assessment & Plan Note (Signed)
Adequately controlled, no change in meds today 

## 2012-02-15 NOTE — Assessment & Plan Note (Signed)
Given handout on DASH diet and encouraged to increase exercise.

## 2012-02-15 NOTE — Assessment & Plan Note (Signed)
Had strictures stretched once and has done well since then

## 2012-02-15 NOTE — Assessment & Plan Note (Signed)
Patient has been on Cymbalta for over 10 years, is concerned it is not working as well and anxiety is worsening. Will consider altering meds at next visit.

## 2012-02-28 ENCOUNTER — Encounter: Payer: Self-pay | Admitting: Family Medicine

## 2012-02-28 ENCOUNTER — Ambulatory Visit (INDEPENDENT_AMBULATORY_CARE_PROVIDER_SITE_OTHER): Payer: BC Managed Care – PPO | Admitting: Family Medicine

## 2012-02-28 VITALS — BP 109/73 | HR 80 | Temp 97.5°F | Ht 65.0 in | Wt 235.0 lb

## 2012-02-28 DIAGNOSIS — F419 Anxiety disorder, unspecified: Secondary | ICD-10-CM

## 2012-02-28 DIAGNOSIS — R259 Unspecified abnormal involuntary movements: Secondary | ICD-10-CM

## 2012-02-28 DIAGNOSIS — L74519 Primary focal hyperhidrosis, unspecified: Secondary | ICD-10-CM

## 2012-02-28 DIAGNOSIS — F418 Other specified anxiety disorders: Secondary | ICD-10-CM

## 2012-02-28 DIAGNOSIS — F341 Dysthymic disorder: Secondary | ICD-10-CM

## 2012-02-28 DIAGNOSIS — R251 Tremor, unspecified: Secondary | ICD-10-CM

## 2012-02-28 DIAGNOSIS — E785 Hyperlipidemia, unspecified: Secondary | ICD-10-CM

## 2012-02-28 DIAGNOSIS — R61 Generalized hyperhidrosis: Secondary | ICD-10-CM

## 2012-02-28 DIAGNOSIS — F411 Generalized anxiety disorder: Secondary | ICD-10-CM

## 2012-02-28 DIAGNOSIS — I10 Essential (primary) hypertension: Secondary | ICD-10-CM

## 2012-02-28 MED ORDER — ALPRAZOLAM ER 1 MG PO TB24: 1.0000 mg | ORAL_TABLET | ORAL | Status: AC

## 2012-02-28 MED ORDER — TELMISARTAN-HCTZ 80-25 MG PO TABS: ORAL_TABLET | ORAL | Status: DC

## 2012-02-28 NOTE — Assessment & Plan Note (Signed)
She has been taking Cymbalta for years and she does think it still helps adequately, she does not want to change at this time.

## 2012-02-28 NOTE — Assessment & Plan Note (Signed)
Tolerating Crestor, take MegaRed caps

## 2012-02-28 NOTE — Assessment & Plan Note (Signed)
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

## 2012-02-28 NOTE — Assessment & Plan Note (Signed)
Adequately well controled

## 2012-02-28 NOTE — Progress Notes (Signed)
Patient ID: Kristin [name], female   DOB: 1950-10-09, 61 y.o.   MRN: [id number] Kristin [name] [id number] [birth year] 02/28/2012      Progress Note-Follow Up  Subjective  Chief Complaint  Chief Complaint  Patient presents with  . Follow-up    2 week    HPI  Patient is a 61 year old Caucasian female who is in today for followup on her new patient appointment. She has been seen by neurology since she was last seen here. Just earlier this week she saw him and had some testing done. 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 She was asked to return annually. They did not see any signs a consent of her Parkinson's disease at this time. Patient is comfortable with this diagnosis as her father does have this diagnosis. No worsening of her tremor is noted at today's visit. She otherwise feels that she's doing acceptably well. She continues on her Cymbalta and does feel that helps her depression unfortunately she continues to struggle with high levels of anxiety secondary to job. She struggles with daily hyperhidrosis as well. She has tried short acting Xanax in small amounts or anxiety she is unclear if it's helped her sweating or not. She says she sweats the point of her hair and sweat especially if she has any presentations. 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 She has added a 5 minute walk daily and has been trying to eat better  Past Medical History  Diagnosis Date  . Fibroids   . Abnormal vaginal Pap smear   . Anemia   . GERD (gastroesophageal reflux disease)   . Hyperlipidemia   . Hypertension   . Arthritis   . Anxiety   . Obesity   . Hiatal hernia   . Esophageal stricture 1994  . Chronic UTI     sees dr Vonita Moss  . Measles as a child  . Chicken pox as a child  . 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 Depression with anxiety 11/02/2009    Qualifier: Diagnosis  of  By: Mayford Knife LPN, Domenic Polite     Past Surgical History  Procedure Date  . Wisdom teeth extracted   . Laporoscopy   . Tonsillectomy   . Abdominal hysterectomy 2006    total  . Partial hip arthroplasty 2010    right  . Esophageal     stretching    Family History  Problem Relation Age of Onset  . Cancer Mother     breast  . Other Mother     arrythmia  . Mental illness Mother     bipolar  . Heart disease Father   . Arthritis Father     rheumatoid  . Depression Sister   . Arthritis Sister     History   Social History  . Marital Status: Married    Spouse Name: N/A    Number of Children: N/A  . Years of Education: N/A   Occupational History  . Not on file.   Social History Main Topics  . Smoking status: Never Smoker   . Smokeless tobacco: Never Used  . Alcohol Use: Yes     4 glasses of wine weekly  . Drug Use: No  . Sexually Active: Yes -- Female partner(s)   Other Topics Concern  . Not on file   Social History Narrative  . No narrative on file    Current Outpatient Prescriptions  on File Prior to Visit  Medication Sig Dispense Refill  . ALPRAZolam (XANAX) 0.5 MG tablet Take 1 tablet (0.5 mg total) by mouth 3 (three) times daily as needed for sleep or anxiety.  30 tablet  1  . CRESTOR 10 MG tablet take 1 tablet by mouth ON MONDAYS, WEDNESDAYS, AND FRIDAYS  15 tablet  6  . CYMBALTA 60 MG capsule take 1 capsule by mouth once daily  30 capsule  6  . metoprolol tartrate (LOPRESSOR) 25 MG tablet TAKE 1/2 TABLET BY MOUTH 2 TIMES DAILY  30 tablet  3  . omeprazole (PRILOSEC) 40 MG capsule Take 1 capsule (40 mg total) by mouth daily.  30 capsule  5  . DISCONTD: MICARDIS HCT 80-25 MG per tablet take 1/2 tablet by mouth once daily  30 tablet  0  . sulfamethoxazole-trimethoprim (BACTRIM DS) 800-160 MG per tablet Take 1 tablet by mouth 2 (two) times daily. If UTI symptoms are present        Allergies  Allergen Reactions  . Penicillins     Review of  Systems  Review of Systems  Constitutional: Positive for diaphoresis. Negative for fever and malaise/fatigue.  HENT: Negative for congestion.   Eyes: Negative for discharge.  Respiratory: Negative for shortness of breath.   Cardiovascular: Negative for chest pain, palpitations and leg swelling.  Gastrointestinal: Negative for nausea, abdominal pain and diarrhea.  Genitourinary: Negative for dysuria.  Musculoskeletal: Negative for falls.  Skin: Negative for rash.  Neurological: Positive for tremors. Negative for loss of consciousness and headaches.  Endo/Heme/Allergies: Negative for polydipsia.  Psychiatric/Behavioral: Negative for depression and suicidal ideas. The patient is nervous/anxious. The patient does not have insomnia.     Objective  BP 109/73  Pulse 80  Temp 97.5 F (36.4 C) (Temporal)  Ht 5\' 5"  (1.651 m)  Wt 235 lb (106.595 kg)  BMI 39.11 kg/m2  SpO2 93%  Physical Exam  Physical Exam  Constitutional: She is oriented to person, place, and time and well-developed, well-nourished, and in no distress. No distress.  HENT:  Head: Normocephalic and atraumatic.  Eyes: Conjunctivae are normal.  Neck: Neck supple. No thyromegaly present.  Cardiovascular: Normal rate, regular rhythm and normal heart sounds.   No murmur heard. Pulmonary/Chest: Effort normal and breath sounds normal. She has no wheezes.  Abdominal: She exhibits no distension and no mass.  Musculoskeletal: She exhibits no edema.  Lymphadenopathy:    She has no cervical adenopathy.  Neurological: She is alert and oriented to person, place, and time.  Skin: Skin is warm and dry. No rash noted. She is not diaphoretic.  Psychiatric: Memory, affect and judgment normal.    Lab Results  Component Value Date   TSH 0.74 02/15/2012   Lab Results  Component Value Date   WBC 6.4 02/15/2012   HGB 12.8 02/15/2012   HCT 38.2 02/15/2012   MCV 88.1 02/15/2012   PLT 232.0 02/15/2012   Lab Results  Component Value  Date   CREATININE 0.8 02/15/2012   BUN 17 02/15/2012   NA 143 02/15/2012   K 4.5 02/15/2012   CL 106 02/15/2012   CO2 30 02/15/2012   Lab Results  Component Value Date   ALT 21 02/15/2012   AST 21 02/15/2012   ALKPHOS 66 02/15/2012   BILITOT 0.7 02/15/2012   Lab Results  Component Value Date   CHOL 177 02/15/2012   Lab Results  Component Value Date   HDL 54.10 02/15/2012   Lab Results  Component  Value Date   LDLCALC 92 02/15/2012   Lab Results  Component Value Date   TRIG 155.0* 02/15/2012   Lab Results  Component Value Date   CHOLHDL 3 02/15/2012     Assessment & Plan  Tremor Patient was seen by neurology earlier this week and after further evaluation she has been told this is probably a benign familial tremor and she does not need to see them again for a year unless something changes.   Depression with anxiety She has been taking Cymbalta for years and she does think it still helps adequately, she does not want to change at this time.  Hyperhydrosis disorder She is encouraged to try dietary changes such as avoiding caffeine, alcohol, simple carbs. She is also given a prescription for Xanax XR 1 mg daily to see if this is helpful  HYPERTENSION Adequately well controled  HYPERLIPIDEMIA Tolerating Crestor, take MegaRed caps

## 2012-02-28 NOTE — Assessment & Plan Note (Signed)
She is encouraged to try dietary changes such as avoiding caffeine, alcohol, simple carbs. She is also given a prescription for Xanax XR 1 mg daily to see if this is helpful

## 2012-02-28 NOTE — Patient Instructions (Addendum)
Hypertriglyceridemia  Diet for High blood levels of Triglycerides Most fats in food are triglycerides. Triglycerides in your blood are stored as fat in your body. High levels of triglycerides in your blood may put you at a greater risk for heart disease and stroke.  Normal triglyceride levels are less than 150 mg/dL. Borderline high levels are 150-199 mg/dl. High levels are 200 - 499 mg/dL, and very high triglyceride levels are greater than 500 mg/dL. The decision to treat high triglycerides is generally based on the level. For people with borderline or high triglyceride levels, treatment includes weight loss and exercise. Drugs are recommended for people with very high triglyceride levels. Many people who need treatment for high triglyceride levels have metabolic syndrome. This syndrome is a collection of disorders that often include: insulin resistance, high blood pressure, blood clotting problems, high cholesterol and triglycerides. TESTING PROCEDURE FOR TRIGLYCERIDES  You should not eat 4 hours before getting your triglycerides measured. The normal range of triglycerides is between 10 and 250 milligrams per deciliter (mg/dl). Some people may have extreme levels (1000 or above), but your triglyceride level may be too high if it is above 150 mg/dl, depending on what other risk factors you have for heart disease.   People with high blood triglycerides may also have high blood cholesterol levels. If you have high blood cholesterol as well as high blood triglycerides, your risk for heart disease is probably greater than if you only had high triglycerides. High blood cholesterol is one of the main risk factors for heart disease.  CHANGING YOUR DIET  Your weight can affect your blood triglyceride level. If you are more than 20% above your ideal body weight, you may be able to lower your blood triglycerides by losing weight. Eating less and exercising regularly is the best way to combat this. Fat provides  more calories than any other food. The best way to lose weight is to eat less fat. Only 30% of your total calories should come from fat. Less than 7% of your diet should come from saturated fat. A diet low in fat and saturated fat is the same as a diet to decrease blood cholesterol. By eating a diet lower in fat, you may lose weight, lower your blood cholesterol, and lower your blood triglyceride level.  Eating a diet low in fat, especially saturated fat, may also help you lower your blood triglyceride level. Ask your dietitian to help you figure how much fat you can eat based on the number of calories your caregiver has prescribed for you.  Exercise, in addition to helping with weight loss may also help lower triglyceride levels.   Alcohol can increase blood triglycerides. You may need to stop drinking alcoholic beverages.   Too much carbohydrate in your diet may also increase your blood triglycerides. Some complex carbohydrates are necessary in your diet. These may include bread, rice, potatoes, other starchy vegetables and cereals.   Reduce "simple" carbohydrates. These may include pure sugars, candy, honey, and jelly without losing other nutrients. If you have the kind of high blood triglycerides that is affected by the amount of carbohydrates in your diet, you will need to eat less sugar and less high-sugar foods. Your caregiver can help you with this.   Adding 2-4 grams of fish oil (EPA+ DHA) may also help lower triglycerides. Speak with your caregiver before adding any supplements to your regimen.  Following the Diet  Maintain your ideal weight. Your caregivers can help you with a diet. Generally,   eating less food and getting more exercise will help you lose weight. Joining a weight control group may also help. Ask your caregivers for a good weight control group in your area.  Eat low-fat foods instead of high-fat foods. This can help you lose weight too.  These foods are lower in fat. Eat MORE  of these:   Dried beans, peas, and lentils.   Egg whites.   Low-fat cottage cheese.   Fish.   Lean cuts of meat, such as round, sirloin, rump, and flank (cut extra fat off meat you fix).   Whole grain breads, cereals and pasta.   Skim and nonfat dry milk.   Low-fat yogurt.   Poultry without the skin.   Cheese made with skim or part-skim milk, such as mozzarella, parmesan, farmers', ricotta, or pot cheese.  These are higher fat foods. Eat LESS of these:   Whole milk and foods made from whole milk, such as American, blue, cheddar, monterey jack, and swiss cheese   High-fat meats, such as luncheon meats, sausages, knockwurst, bratwurst, hot dogs, ribs, corned beef, ground pork, and regular ground beef.   Fried foods.  Limit saturated fats in your diet. Substituting unsaturated fat for saturated fat may decrease your blood triglyceride level. You will need to read package labels to know which products contain saturated fats.  These foods are high in saturated fat. Eat LESS of these:   Fried pork skins.   Whole milk.   Skin and fat from poultry.   Palm oil.   Butter.   Shortening.   Cream cheese.   Bacon.   Margarines and baked goods made from listed oils.   Vegetable shortenings.   Chitterlings.   Fat from meats.   Coconut oil.   Palm kernel oil.   Lard.   Cream.   Sour cream.   Fatback.   Coffee whiteners and non-dairy creamers made with these oils.   Cheese made from whole milk.  Use unsaturated fats (both polyunsaturated and monounsaturated) moderately. Remember, even though unsaturated fats are better than saturated fats; you still want a diet low in total fat.  These foods are high in unsaturated fat:   Canola oil.   Sunflower oil.   Mayonnaise.   Almonds.   Peanuts.   Pine nuts.   Margarines made with these oils.   Safflower oil.   Olive oil.   Avocados.   Cashews.   Peanut butter.   Sunflower seeds.   Soybean oil.     Peanut oil.   Olives.   Pecans.   Walnuts.   Pumpkin seeds.  Avoid sugar and other high-sugar foods. This will decrease carbohydrates without decreasing other nutrients. Sugar in your food goes rapidly to your blood. When there is excess sugar in your blood, your liver may use it to make more triglycerides. Sugar also contains calories without other important nutrients.  Eat LESS of these:   Sugar, brown sugar, powdered sugar, jam, jelly, preserves, honey, syrup, molasses, pies, candy, cakes, cookies, frosting, pastries, colas, soft drinks, punches, fruit drinks, and regular gelatin.   Avoid alcohol. Alcohol, even more than sugar, may increase blood triglycerides. In addition, alcohol is high in calories and low in nutrients. Ask for sparkling water, or a diet soft drink instead of an alcoholic beverage.  Suggestions for planning and preparing meals   Bake, broil, grill or roast meats instead of frying.   Remove fat from meats and skin from poultry before cooking.   Add spices,   herbs, lemon juice or vinegar to vegetables instead of salt, rich sauces or gravies.   Use a non-stick skillet without fat or use no-stick sprays.   Cool and refrigerate stews and broth. Then remove the hardened fat floating on the surface before serving.   Refrigerate meat drippings and skim off fat to make low-fat gravies.   Serve more fish.   Use less butter, margarine and other high-fat spreads on bread or vegetables.   Use skim or reconstituted non-fat dry milk for cooking.   Cook with low-fat cheeses.   Substitute low-fat yogurt or cottage cheese for all or part of the sour cream in recipes for sauces, dips or congealed salads.   Use half yogurt/half mayonnaise in salad recipes.   Substitute evaporated skim milk for cream. Evaporated skim milk or reconstituted non-fat dry milk can be whipped and substituted for whipped cream in certain recipes.   Choose fresh fruits for dessert instead of  high-fat foods such as pies or cakes. Fruits are naturally low in fat.  When Dining Out   Order low-fat appetizers such as fruit or vegetable juice, pasta with vegetables or tomato sauce.   Select clear, rather than cream soups.   Ask that dressings and gravies be served on the side. Then use less of them.   Order foods that are baked, broiled, poached, steamed, stir-fried, or roasted.   Ask for margarine instead of butter, and use only a small amount.   Drink sparkling water, unsweetened tea or coffee, or diet soft drinks instead of alcohol or other sweet beverages.  QUESTIONS AND ANSWERS ABOUT OTHER FATS IN THE BLOOD: SATURATED FAT, TRANS FAT, AND CHOLESTEROL What is trans fat? Trans fat is a type of fat that is formed when vegetable oil is hardened through a process called hydrogenation. This process helps makes foods more solid, gives them shape, and prolongs their shelf life. Trans fats are also called hydrogenated or partially hydrogenated oils.  What do saturated fat, trans fat, and cholesterol in foods have to do with heart disease? Saturated fat, trans fat, and cholesterol in the diet all raise the level of LDL "bad" cholesterol in the blood. The higher the LDL cholesterol, the greater the risk for coronary heart disease (CHD). Saturated fat and trans fat raise LDL similarly.  What foods contain saturated fat, trans fat, and cholesterol? High amounts of saturated fat are found in animal products, such as fatty cuts of meat, chicken skin, and full-fat dairy products like butter, whole milk, cream, and cheese, and in tropical vegetable oils such as palm, palm kernel, and coconut oil. Trans fat is found in some of the same foods as saturated fat, such as vegetable shortening, some margarines (especially hard or stick margarine), crackers, cookies, baked goods, fried foods, salad dressings, and other processed foods made with partially hydrogenated vegetable oils. Small amounts of trans fat  also occur naturally in some animal products, such as milk products, beef, and lamb. Foods high in cholesterol include liver, other organ meats, egg yolks, shrimp, and full-fat dairy products. How can I use the new food label to make heart-healthy food choices? Check the Nutrition Facts panel of the food label. Choose foods lower in saturated fat, trans fat, and cholesterol. For saturated fat and cholesterol, you can also use the Percent Daily Value (%DV): 5% DV or less is low, and 20% DV or more is high. (There is no %DV for trans fat.) Use the Nutrition Facts panel to choose foods low in   saturated fat and cholesterol, and if the trans fat is not listed, read the ingredients and limit products that list shortening or hydrogenated or partially hydrogenated vegetable oil, which tend to be high in trans fat. POINTS TO REMEMBER: YOU NEED A LITTLE TLC (THERAPEUTIC LIFESTYLE CHANGES)  Discuss your risk for heart disease with your caregivers, and take steps to reduce risk factors.   Change your diet. Choose foods that are low in saturated fat, trans fat, and cholesterol.   Add exercise to your daily routine if it is not already being done. Participate in physical activity of moderate intensity, like brisk walking, for at least 30 minutes on most, and preferably all days of the week. No time? Break the 30 minutes into three, 10-minute segments during the day.   Stop smoking. If you do smoke, contact your caregiver to discuss ways in which they can help you quit.   Do not use street drugs.   Maintain a normal weight.   Maintain a healthy blood pressure.   Keep up with your blood work for checking the fats in your blood as directed by your caregiver.  Document Released: 06/08/2004 Document Revised: 08/10/2011 Document Reviewed: 01/04/2009 [facility] Patient Information 2012 [place], [place].   Start the [facility]

## 2012-04-10 ENCOUNTER — Ambulatory Visit: Payer: BC Managed Care – PPO | Admitting: Family Medicine

## 2012-04-13 ENCOUNTER — Other Ambulatory Visit: Payer: Self-pay | Admitting: Internal Medicine

## 2012-06-19 ENCOUNTER — Encounter: Payer: Self-pay | Admitting: Family Medicine

## 2012-06-19 ENCOUNTER — Ambulatory Visit (INDEPENDENT_AMBULATORY_CARE_PROVIDER_SITE_OTHER): Payer: BC Managed Care – PPO | Admitting: Family Medicine

## 2012-06-19 VITALS — BP 95/65 | HR 79 | Temp 97.1°F | Ht 65.0 in | Wt 237.0 lb

## 2012-06-19 DIAGNOSIS — M79671 Pain in right foot: Secondary | ICD-10-CM | POA: Insufficient documentation

## 2012-06-19 DIAGNOSIS — E785 Hyperlipidemia, unspecified: Secondary | ICD-10-CM

## 2012-06-19 DIAGNOSIS — F418 Other specified anxiety disorders: Secondary | ICD-10-CM

## 2012-06-19 DIAGNOSIS — F341 Dysthymic disorder: Secondary | ICD-10-CM

## 2012-06-19 DIAGNOSIS — Z23 Encounter for immunization: Secondary | ICD-10-CM

## 2012-06-19 DIAGNOSIS — M722 Plantar fascial fibromatosis: Secondary | ICD-10-CM

## 2012-06-19 DIAGNOSIS — E669 Obesity, unspecified: Secondary | ICD-10-CM

## 2012-06-19 DIAGNOSIS — I10 Essential (primary) hypertension: Secondary | ICD-10-CM

## 2012-06-19 DIAGNOSIS — M79672 Pain in left foot: Secondary | ICD-10-CM | POA: Insufficient documentation

## 2012-06-19 DIAGNOSIS — M1990 Unspecified osteoarthritis, unspecified site: Secondary | ICD-10-CM

## 2012-06-19 HISTORY — DX: Pain in right foot: M79.671

## 2012-06-19 HISTORY — DX: Plantar fascial fibromatosis: M72.2

## 2012-06-19 MED ORDER — KRILL OIL PO CAPS: ORAL_CAPSULE | ORAL | Status: DC

## 2012-06-19 NOTE — Assessment & Plan Note (Signed)
Encouraged ice and Aspercreme and stretching, try Dr Margart Sickles inserts and if no improvement then consider podiatric referral in future.

## 2012-06-19 NOTE — Patient Instructions (Addendum)

## 2012-06-19 NOTE — Assessment & Plan Note (Signed)
Tolerating Crestor 3 x a week. Start MegaRed caps daily

## 2012-06-20 NOTE — Assessment & Plan Note (Signed)
Overly controlled will hold Metoprolol for now and continue Micardis HCT.

## 2012-06-20 NOTE — Progress Notes (Signed)
Patient ID: Kristin [name], female   DOB: 09-24-1950, 61 y.o.   MRN: [id number] Kristin [name] [id number] [birth year] 06/20/2012      Progress Note-Follow Up  Subjective  Chief Complaint  Chief Complaint  Patient presents with  . Foot Pain    left heel pain Xsummer at night pain, X 9 days piercing pain    HPI  Patient is a 61 Caucasian female who is in today complaining of left ear pain. It is bothering her off and on for months but for the last 5-7 days this got significantly worse. She has pain constantly at this point the pain is typically worse upon first arising with she's been sitting for a while or sleeping. She has tried moving her foot wear around somewhat but that has only been marginally helpful. History taking nonsteroidal anti-inflammatories during the day and this has also been marginally helpful. She denies any injury or previous history of similar symptoms. She denies any similar symptoms in the right heel. Other than her pain her other concern is her ongoing stressors at work and her increasing anxiety regarding her health she continues to work long hours she denies any other recent illness, chest pain, palpitations, shortness of breath, GI or GU concerns at this time.  Past Medical History  Diagnosis Date  . Fibroids   . Abnormal vaginal Pap smear   . Anemia   . GERD (gastroesophageal reflux disease)   . Hyperlipidemia   . Hypertension   . Arthritis   . Anxiety   . Obesity   . Hiatal hernia   . Esophageal stricture 1994  . Chronic UTI     sees dr Vonita Moss  . Measles as a child  . Chicken pox as a child  . 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 Depression with anxiety 11/02/2009    Qualifier: Diagnosis of  By: Mayford Knife, LPN, Domenic Polite   . Plantar fasciitis of left foot 06/19/2012    Past Surgical History  Procedure Date  . Wisdom teeth extracted   . Laporoscopy   . Tonsillectomy   . Abdominal hysterectomy 2006    total  . Partial hip arthroplasty 2010    right    . Esophageal     stretching    Family History  Problem Relation Age of Onset  . Cancer Mother     breast  . Other Mother     arrythmia  . Mental illness Mother     bipolar  . Heart disease Father   . Arthritis Father     rheumatoid  . Depression Sister   . Arthritis Sister     History   Social History  . Marital Status: Married    Spouse Name: N/A    Number of Children: N/A  . Years of Education: N/A   Occupational History  . Not on file.   Social History Main Topics  . Smoking status: Never Smoker   . Smokeless tobacco: Never Used  . Alcohol Use: Yes     4 glasses of wine weekly  . Drug Use: No  . Sexually Active: Yes -- Female partner(s)   Other Topics Concern  . Not on file   Social History Narrative  . No narrative on file    Current Outpatient Prescriptions on File Prior to Visit  Medication Sig Dispense Refill  . ALPRAZolam (XANAX) 0.5 MG tablet Take 1 tablet (0.5 mg total) by mouth 3 (three) times daily as needed for sleep or anxiety.  30  tablet  1  . CRESTOR 10 MG tablet take 1 tablet by mouth ON MONDAYS, WEDNESDAYS, AND FRIDAYS  15 tablet  6  . CYMBALTA 60 MG capsule take 1 capsule by mouth once daily  30 capsule  6  . omeprazole (PRILOSEC) 40 MG capsule Take 1 capsule (40 mg total) by mouth daily.  30 capsule  5  . telmisartan-hydrochlorothiazide (MICARDIS HCT) 80-25 MG per tablet 1/2 tab po daily  30 tablet  5    Allergies  Allergen Reactions  . Penicillins     Review of Systems  Review of Systems  Constitutional: Positive for malaise/fatigue. Negative for fever.  HENT: Negative for congestion.   Eyes: Negative for discharge.  Respiratory: Negative for shortness of breath.   Cardiovascular: Negative for chest pain, palpitations and leg swelling.  Gastrointestinal: Negative for nausea, abdominal pain and diarrhea.  Genitourinary: Negative for dysuria.  Musculoskeletal: Positive for joint pain. Negative for falls.       Left heal pain to  some degree since summer but worse in past 9 days.   Skin: Negative for rash.  Neurological: Negative for loss of consciousness and headaches.  Endo/Heme/Allergies: Negative for polydipsia.  Psychiatric/Behavioral: Positive for depression. Negative for suicidal ideas. The patient is nervous/anxious. The patient does not have insomnia.     Objective  BP 95/65  Pulse 79  Temp 97.1 F (36.2 C) (Temporal)  Ht 5\' 5"  (1.651 m)  Wt 237 lb (107.502 kg)  BMI 39.44 kg/m2  SpO2 95%  Physical Exam  Physical Exam  Constitutional: She is oriented to person, place, and time and well-developed, well-nourished, and in no distress. No distress.  HENT:  Head: Normocephalic and atraumatic.  Eyes: Conjunctivae normal are normal.  Neck: Neck supple. No thyromegaly present.  Cardiovascular: Normal rate, regular rhythm and normal heart sounds.   No murmur heard. Pulmonary/Chest: Effort normal and breath sounds normal. She has no wheezes.  Abdominal: She exhibits no distension and no mass.  Musculoskeletal: She exhibits tenderness. She exhibits no edema.       Pain with palpation on left heel  Lymphadenopathy:    She has no cervical adenopathy.  Neurological: She is alert and oriented to person, place, and time.  Skin: Skin is warm and dry. No rash noted. She is not diaphoretic.  Psychiatric: Memory, affect and judgment normal.    Lab Results  Component Value Date   TSH 0.74 02/15/2012   Lab Results  Component Value Date   WBC 6.4 02/15/2012   HGB 12.8 02/15/2012   HCT 38.2 02/15/2012   MCV 88.1 02/15/2012   PLT 232.0 02/15/2012   Lab Results  Component Value Date   CREATININE 0.8 02/15/2012   BUN 17 02/15/2012   NA 143 02/15/2012   K 4.5 02/15/2012   CL 106 02/15/2012   CO2 30 02/15/2012   Lab Results  Component Value Date   ALT 21 02/15/2012   AST 21 02/15/2012   ALKPHOS 66 02/15/2012   BILITOT 0.7 02/15/2012   Lab Results  Component Value Date   CHOL 177 02/15/2012   Lab Results    Component Value Date   HDL 54.10 02/15/2012   Lab Results  Component Value Date   LDLCALC 92 02/15/2012   Lab Results  Component Value Date   TRIG 155.0* 02/15/2012   Lab Results  Component Value Date   CHOLHDL 3 02/15/2012     Assessment & Plan  Plantar fasciitis of left foot Encouraged ice and Aspercreme  and stretching, try Dr Margart Sickles inserts and if no improvement then consider podiatric referral in future.    HYPERLIPIDEMIA Tolerating Crestor 3 x a week. Start MegaRed caps daily  HYPERTENSION Overly controlled will hold Metoprolol for now and continue Micardis HCT.  Depression with anxiety Continues to struggle with significant work stressors but will not change meds at this time.  OBESITY, UNSPECIFIED Encouraged DASH diet and decreased po intake

## 2012-06-20 NOTE — Assessment & Plan Note (Signed)
Encouraged DASH diet and decreased po intake

## 2012-06-20 NOTE — Assessment & Plan Note (Signed)
Continues to struggle with significant work stressors but will not change meds at this time.

## 2012-07-17 ENCOUNTER — Ambulatory Visit (INDEPENDENT_AMBULATORY_CARE_PROVIDER_SITE_OTHER): Payer: BC Managed Care – PPO | Admitting: Family Medicine

## 2012-07-17 ENCOUNTER — Other Ambulatory Visit (HOSPITAL_COMMUNITY)
Admission: RE | Admit: 2012-07-17 | Discharge: 2012-07-17 | Disposition: A | Discharge status: Home / Self Care | Payer: BC Managed Care – PPO | Source: Ambulatory Visit | Attending: Family Medicine | Admitting: Family Medicine

## 2012-07-17 ENCOUNTER — Encounter: Payer: Self-pay | Admitting: Family Medicine

## 2012-07-17 VITALS — BP 128/80 | HR 89 | Temp 98.1°F | Ht 65.0 in | Wt 232.8 lb

## 2012-07-17 DIAGNOSIS — R259 Unspecified abnormal involuntary movements: Secondary | ICD-10-CM

## 2012-07-17 DIAGNOSIS — Z1211 Encounter for screening for malignant neoplasm of colon: Secondary | ICD-10-CM

## 2012-07-17 DIAGNOSIS — L259 Unspecified contact dermatitis, unspecified cause: Secondary | ICD-10-CM

## 2012-07-17 DIAGNOSIS — L309 Dermatitis, unspecified: Secondary | ICD-10-CM

## 2012-07-17 DIAGNOSIS — B372 Candidiasis of skin and nail: Secondary | ICD-10-CM

## 2012-07-17 DIAGNOSIS — E785 Hyperlipidemia, unspecified: Secondary | ICD-10-CM

## 2012-07-17 DIAGNOSIS — Z124 Encounter for screening for malignant neoplasm of cervix: Secondary | ICD-10-CM | POA: Insufficient documentation

## 2012-07-17 DIAGNOSIS — N760 Acute vaginitis: Secondary | ICD-10-CM | POA: Insufficient documentation

## 2012-07-17 DIAGNOSIS — M722 Plantar fascial fibromatosis: Secondary | ICD-10-CM

## 2012-07-17 DIAGNOSIS — I10 Essential (primary) hypertension: Secondary | ICD-10-CM

## 2012-07-17 DIAGNOSIS — Z01419 Encounter for gynecological examination (general) (routine) without abnormal findings: Secondary | ICD-10-CM | POA: Insufficient documentation

## 2012-07-17 DIAGNOSIS — R251 Tremor, unspecified: Secondary | ICD-10-CM

## 2012-07-17 HISTORY — DX: Dermatitis, unspecified: L30.9

## 2012-07-17 MED ORDER — NYSTATIN 100000 UNIT/GM EX CREA: TOPICAL_CREAM | Freq: Two times a day (BID) | CUTANEOUS | Status: DC | PRN

## 2012-07-17 NOTE — Assessment & Plan Note (Signed)
Pain greatly improved with topical treatement

## 2012-07-17 NOTE — Progress Notes (Signed)
Patient ID: Kristin [name], female   DOB: 10-15-1950, 61 y.o.   MRN: [id number] Kristin [name] [id number] 04/10/1951 07/17/2012      Progress Note New Patient  Subjective  Chief Complaint  Chief Complaint  Patient presents with  . Gynecologic Exam    pap    HPI  Patient is a 61 year old Caucasian female in today for an exam. She is complaining of some painful irritated lesions under her breasts which she is treated with antifungals in the past. She notes just this weekend episode of vaginal itching but that is resolved. Otherwise she says her physical health and good. No recent illness, fevers, chills, chest pain, palpitations, shortness of breath, GI or GU concerns. She continues to struggle with significant stress but feels she's doing relatively well in this regard. She did have an abnormal Pap years ago. In 2002 she had a cone biopsy been normal ever since. No discharge or urinary complaints today. No breast complaints.  Past Medical History  Diagnosis Date  . Fibroids   . Abnormal vaginal Pap smear   . Anemia   . GERD (gastroesophageal reflux disease)   . Hyperlipidemia   . Hypertension   . Arthritis   . Anxiety   . Obesity   . Hiatal hernia   . Esophageal stricture 1994  . Chronic UTI     sees dr Vonita Moss  . Measles as a child  . Chicken pox as a child  . 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 Depression with anxiety 11/02/2009    Qualifier: Diagnosis of  By: Mayford Knife, LPN, Domenic Polite   . Plantar fasciitis of left foot 06/19/2012  . Dermatitis 07/17/2012    Past Surgical History  Procedure Date  . Wisdom teeth extracted   . Laporoscopy   . Tonsillectomy   . Abdominal hysterectomy 2006    total  . Partial hip arthroplasty 2010    right  . Esophageal     stretching    Family History  Problem Relation Age of Onset  . Cancer Mother     breast  . Other Mother     arrythmia  . Mental illness Mother     bipolar  . Heart disease Father   . Arthritis Father    rheumatoid  . Depression Sister   . Arthritis Sister     History   Social History  . Marital Status: Married    Spouse Name: N/A    Number of Children: N/A  . Years of Education: N/A   Occupational History  . Not on file.   Social History Main Topics  . Smoking status: Never Smoker   . Smokeless tobacco: Never Used  . Alcohol Use: Yes     Comment: 4 glasses of wine weekly  . Drug Use: No  . Sexually Active: Yes -- Female partner(s)   Other Topics Concern  . Not on file   Social History Narrative  . No narrative on file    Current Outpatient Prescriptions on File Prior to Visit  Medication Sig Dispense Refill  . ALPRAZolam (XANAX) 0.5 MG tablet Take 1 tablet (0.5 mg total) by mouth 3 (three) times daily as needed for sleep or anxiety.  30 tablet  1  . CRESTOR 10 MG tablet take 1 tablet by mouth ON MONDAYS, WEDNESDAYS, AND FRIDAYS  15 tablet  6  . CYMBALTA 60 MG capsule take 1 capsule by mouth once daily  30 capsule  6  . Krill Oil CAPS 1 krill  oil caps daily, MegaRed caps by Schiff      . omeprazole (PRILOSEC) 40 MG capsule Take 1 capsule (40 mg total) by mouth daily.  30 capsule  5  . telmisartan-hydrochlorothiazide (MICARDIS HCT) 80-25 MG per tablet 1/2 tab po daily  30 tablet  5  . [DISCONTINUED] metoprolol tartrate (LOPRESSOR) 25 MG tablet TAKE 1/2 TABLET BY MOUTH 2 TIMES DAILY  30 tablet  3  . [DISCONTINUED] metoprolol tartrate (LOPRESSOR) 25 MG tablet TAKE 1/2 TABLET BY MOUTH 2 TIMES DAILY  30 tablet  3    Allergies  Allergen Reactions  . Penicillins     Review of Systems  Review of Systems  Constitutional: Positive for malaise/fatigue. Negative for fever and chills.  HENT: Negative for hearing loss, nosebleeds and congestion.   Eyes: Negative for discharge.  Respiratory: Negative for cough, sputum production, shortness of breath and wheezing.   Cardiovascular: Negative for chest pain, palpitations and leg swelling.  Gastrointestinal: Negative for heartburn,  nausea, vomiting, abdominal pain, diarrhea, constipation and blood in stool.  Genitourinary: Negative for dysuria, urgency, frequency and hematuria.  Musculoskeletal: Negative for myalgias, back pain and falls.  Skin: Positive for itching and rash.  Neurological: Negative for dizziness, tremors, sensory change, focal weakness, loss of consciousness, weakness and headaches.  Endo/Heme/Allergies: Negative for polydipsia. Does not bruise/bleed easily.  Psychiatric/Behavioral: Negative for depression and suicidal ideas. The patient is not nervous/anxious and does not have insomnia.     Objective  BP 128/80  Pulse 89  Temp 98.1 F (36.7 C) (Temporal)  Ht 5\' 5"  (1.651 m)  Wt 232 lb 12.8 oz (105.597 kg)  BMI 38.74 kg/m2  SpO2 98%  Physical Exam  Physical Exam  Constitutional: She is oriented to person, place, and time and well-developed, well-nourished, and in no distress. No distress.  HENT:  Head: Normocephalic and atraumatic.  Right Ear: External ear normal.  Left Ear: External ear normal.  Mouth/Throat: Oropharynx is clear and moist.  Eyes: Conjunctivae normal are normal.  Neck: Neck supple. No thyromegaly present.  Cardiovascular: Normal rate, regular rhythm and normal heart sounds.   No murmur heard. Pulmonary/Chest: Effort normal and breath sounds normal. She has no wheezes.  Abdominal: Soft. Bowel sounds are normal. She exhibits no distension and no mass.  Genitourinary: Right adnexa normal and left adnexa normal. Vaginal discharge found.       Scant whit discharge. cervix surgically absent  Musculoskeletal: She exhibits no edema.  Lymphadenopathy:    She has no cervical adenopathy.  Neurological: She is alert and oriented to person, place, and time.  Skin: Skin is warm and dry. No rash noted. She is not diaphoretic.  Psychiatric: Memory, affect and judgment normal.       Assessment & Plan  Screening for cervical cancer Pap done today  HYPERTENSION Adequately  controlled  Tremor Tried to stop the Metoprolol but tremor worsened so restarted it.   Plantar fasciitis of left foot Pain greatly improved with topical treatement  Dermatitis Under breasts, suggestive of yeast, started on Nystatin cream bid prn, encouraged Cetaphil soap, witch hazel astringent and blow drying  HYPERLIPIDEMIA Mild avoid trans fats, cont MegaRed and Crestor and increase exercise

## 2012-07-17 NOTE — Assessment & Plan Note (Signed)
Under breasts, suggestive of yeast, started on Nystatin cream bid prn, encouraged Cetaphil soap, witch hazel astringent and blow drying

## 2012-07-17 NOTE — Assessment & Plan Note (Signed)
Adequately controlled 

## 2012-07-17 NOTE — Assessment & Plan Note (Signed)
Pap done today  

## 2012-07-17 NOTE — Assessment & Plan Note (Signed)
Tried to stop the Metoprolol but tremor worsened so restarted it.

## 2012-07-17 NOTE — Patient Instructions (Addendum)
Preventive Care for Adults, Female A healthy lifestyle and preventive care can promote health and wellness. Preventive health guidelines for women include the following key practices.  A routine yearly physical is a good way to check with your caregiver about your health and preventive screening. It is a chance to share any concerns and updates on your health, and to receive a thorough exam.  Visit your dentist for a routine exam and preventive care every 6 months. Brush your teeth twice a day and floss once a day. Good oral hygiene prevents tooth decay and gum disease.  The frequency of eye exams is based on your age, health, family medical history, use of contact lenses, and other factors. Follow your caregiver's recommendations for frequency of eye exams.  Eat a healthy diet. Foods like vegetables, fruits, whole grains, low-fat dairy products, and lean protein foods contain the nutrients you need without too many calories. Decrease your intake of foods high in solid fats, added sugars, and salt. Eat the right amount of calories for you.Get information about a proper diet from your caregiver, if necessary.  Regular physical exercise is one of the most important things you can do for your health. Most adults should get at least 150 minutes of moderate-intensity exercise (any activity that increases your heart rate and causes you to sweat) each week. In addition, most adults need muscle-strengthening exercises on 2 or more days a week.  Maintain a healthy weight. The body mass index (BMI) is a screening tool to identify possible weight problems. It provides an estimate of body fat based on height and weight. Your caregiver can help determine your BMI, and can help you achieve or maintain a healthy weight.For adults 20 years and older:  A BMI below 18.5 is considered underweight.  A BMI of 18.5 to 24.9 is normal.  A BMI of 25 to 29.9 is considered overweight.  A BMI of 30 and above is  considered obese.  Maintain normal blood lipids and cholesterol levels by exercising and minimizing your intake of saturated fat. Eat a balanced diet with plenty of fruit and vegetables. Blood tests for lipids and cholesterol should begin at age 20 and be repeated every 5 years. If your lipid or cholesterol levels are high, you are over 50, or you are at high risk for heart disease, you may need your cholesterol levels checked more frequently.Ongoing high lipid and cholesterol levels should be treated with medicines if diet and exercise are not effective.  If you smoke, find out from your caregiver how to quit. If you do not use tobacco, do not start.  If you are pregnant, do not drink alcohol. If you are breastfeeding, be very cautious about drinking alcohol. If you are not pregnant and choose to drink alcohol, do not exceed 1 drink per day. One drink is considered to be 12 ounces (355 mL) of beer, 5 ounces (148 mL) of wine, or 1.5 ounces (44 mL) of liquor.  Avoid use of street drugs. Do not share needles with anyone. Ask for help if you need support or instructions about stopping the use of drugs.  High blood pressure causes heart disease and increases the risk of stroke. Your blood pressure should be checked at least every 1 to 2 years. Ongoing high blood pressure should be treated with medicines if weight loss and exercise are not effective.  If you are 55 to 62 years old, ask your caregiver if you should take aspirin to prevent strokes.  Diabetes   screening involves taking a blood sample to check your fasting blood sugar level. This should be done once every 3 years, after age 45, if you are within normal weight and without risk factors for diabetes. Testing should be considered at a younger age or be carried out more frequently if you are overweight and have at least 1 risk factor for diabetes.  Breast cancer screening is essential preventive care for women. You should practice "breast  self-awareness." This means understanding the normal appearance and feel of your breasts and may include breast self-examination. Any changes detected, no matter how small, should be reported to a caregiver. Women in their 20s and 30s should have a clinical breast exam (CBE) by a caregiver as part of a regular health exam every 1 to 3 years. After age 40, women should have a CBE every year. Starting at age 40, women should consider having a mammography (breast X-ray test) every year. Women who have a family history of breast cancer should talk to their caregiver about genetic screening. Women at a high risk of breast cancer should talk to their caregivers about having magnetic resonance imaging (MRI) and a mammography every year.  The Pap test is a screening test for cervical cancer. A Pap test can show cell changes on the cervix that might become cervical cancer if left untreated. A Pap test is a procedure in which cells are obtained and examined from the lower end of the uterus (cervix).  Women should have a Pap test starting at age 21.  Between ages 21 and 29, Pap tests should be repeated every 2 years.  Beginning at age 30, you should have a Pap test every 3 years as long as the past 3 Pap tests have been normal.  Some women have medical problems that increase the chance of getting cervical cancer. Talk to your caregiver about these problems. It is especially important to talk to your caregiver if a new problem develops soon after your last Pap test. In these cases, your caregiver may recommend more frequent screening and Pap tests.  The above recommendations are the same for women who have or have not gotten the vaccine for human papillomavirus (HPV).  If you had a hysterectomy for a problem that was not cancer or a condition that could lead to cancer, then you no longer need Pap tests. Even if you no longer need a Pap test, a regular exam is a good idea to make sure no other problems are  starting.  If you are between ages 65 and 70, and you have had normal Pap tests going back 10 years, you no longer need Pap tests. Even if you no longer need a Pap test, a regular exam is a good idea to make sure no other problems are starting.  If you have had past treatment for cervical cancer or a condition that could lead to cancer, you need Pap tests and screening for cancer for at least 20 years after your treatment.  If Pap tests have been discontinued, risk factors (such as a new sexual partner) need to be reassessed to determine if screening should be resumed.  The HPV test is an additional test that may be used for cervical cancer screening. The HPV test looks for the virus that can cause the cell changes on the cervix. The cells collected during the Pap test can be tested for HPV. The HPV test could be used to screen women aged 30 years and older, and should   be used in women of any age who have unclear Pap test results. After the age of 30, women should have HPV testing at the same frequency as a Pap test.  Colorectal cancer can be detected and often prevented. Most routine colorectal cancer screening begins at the age of 50 and continues through age 75. However, your caregiver may recommend screening at an earlier age if you have risk factors for colon cancer. On a yearly basis, your caregiver may provide home test kits to check for hidden blood in the stool. Use of a small camera at the end of a tube, to directly examine the colon (sigmoidoscopy or colonoscopy), can detect the earliest forms of colorectal cancer. Talk to your caregiver about this at age 50, when routine screening begins. Direct examination of the colon should be repeated every 5 to 10 years through age 75, unless early forms of pre-cancerous polyps or small growths are found.  Hepatitis C blood testing is recommended for all people born from 1945 through 1965 and any individual with known risks for hepatitis C.  Practice  safe sex. Use condoms and avoid high-risk sexual practices to reduce the spread of sexually transmitted infections (STIs). STIs include gonorrhea, chlamydia, syphilis, trichomonas, herpes, HPV, and human immunodeficiency virus (HIV). Herpes, HIV, and HPV are viral illnesses that have no cure. They can result in disability, cancer, and death. Sexually active women aged 25 and younger should be checked for chlamydia. Older women with new or multiple partners should also be tested for chlamydia. Testing for other STIs is recommended if you are sexually active and at increased risk.  Osteoporosis is a disease in which the bones lose minerals and strength with aging. This can result in serious bone fractures. The risk of osteoporosis can be identified using a bone density scan. Women ages 65 and over and women at risk for fractures or osteoporosis should discuss screening with their caregivers. Ask your caregiver whether you should take a calcium supplement or vitamin D to reduce the rate of osteoporosis.  Menopause can be associated with physical symptoms and risks. Hormone replacement therapy is available to decrease symptoms and risks. You should talk to your caregiver about whether hormone replacement therapy is right for you.  Use sunscreen with sun protection factor (SPF) of 30 or more. Apply sunscreen liberally and repeatedly throughout the day. You should seek shade when your shadow is shorter than you. Protect yourself by wearing long sleeves, pants, a wide-brimmed hat, and sunglasses year round, whenever you are outdoors.  Once a month, do a whole body skin exam, using a mirror to look at the skin on your back. Notify your caregiver of new moles, moles that have irregular borders, moles that are larger than a pencil eraser, or moles that have changed in shape or color.  Stay current with required immunizations.  Influenza. You need a dose every fall (or winter). The composition of the flu vaccine  changes each year, so being vaccinated once is not enough.  Pneumococcal polysaccharide. You need 1 to 2 doses if you smoke cigarettes or if you have certain chronic medical conditions. You need 1 dose at age 65 (or older) if you have never been vaccinated.  Tetanus, diphtheria, pertussis (Tdap, Td). Get 1 dose of Tdap vaccine if you are younger than age 65, are over 65 and have contact with an infant, are a healthcare worker, are pregnant, or simply want to be protected from whooping cough. After that, you need a Td   booster dose every 10 years. Consult your caregiver if you have not had at least 3 tetanus and diphtheria-containing shots sometime in your life or have a deep or dirty wound.  HPV. You need this vaccine if you are a woman age 26 or younger. The vaccine is given in 3 doses over 6 months.  Measles, mumps, rubella (MMR). You need at least 1 dose of MMR if you were born in 1957 or later. You may also need a second dose.  Meningococcal. If you are age 19 to 21 and a first-year college student living in a residence hall, or have one of several medical conditions, you need to get vaccinated against meningococcal disease. You may also need additional booster doses.  Zoster (shingles). If you are age 60 or older, you should get this vaccine.  Varicella (chickenpox). If you have never had chickenpox or you were vaccinated but received only 1 dose, talk to your caregiver to find out if you need this vaccine.  Hepatitis A. You need this vaccine if you have a specific risk factor for hepatitis A virus infection or you simply wish to be protected from this disease. The vaccine is usually given as 2 doses, 6 to 18 months apart.  Hepatitis B. You need this vaccine if you have a specific risk factor for hepatitis B virus infection or you simply wish to be protected from this disease. The vaccine is given in 3 doses, usually over 6 months. Preventive Services / Frequency Ages 19 to 39  Blood  pressure check.** / Every 1 to 2 years.  Lipid and cholesterol check.** / Every 5 years beginning at age 20.  Clinical breast exam.** / Every 3 years for women in their 20s and 30s.  Pap test.** / Every 2 years from ages 21 through 29. Every 3 years starting at age 30 through age 65 or 70 with a history of 3 consecutive normal Pap tests.  HPV screening.** / Every 3 years from ages 30 through ages 65 to 70 with a history of 3 consecutive normal Pap tests.  Hepatitis C blood test.** / For any individual with known risks for hepatitis C.  Skin self-exam. / Monthly.  Influenza immunization.** / Every year.  Pneumococcal polysaccharide immunization.** / 1 to 2 doses if you smoke cigarettes or if you have certain chronic medical conditions.  Tetanus, diphtheria, pertussis (Tdap, Td) immunization. / A one-time dose of Tdap vaccine. After that, you need a Td booster dose every 10 years.  HPV immunization. / 3 doses over 6 months, if you are 26 and younger.  Measles, mumps, rubella (MMR) immunization. / You need at least 1 dose of MMR if you were born in 1957 or later. You may also need a second dose.  Meningococcal immunization. / 1 dose if you are age 19 to 21 and a first-year college student living in a residence hall, or have one of several medical conditions, you need to get vaccinated against meningococcal disease. You may also need additional booster doses.  Varicella immunization.** / Consult your caregiver.  Hepatitis A immunization.** / Consult your caregiver. 2 doses, 6 to 18 months apart.  Hepatitis B immunization.** / Consult your caregiver. 3 doses usually over 6 months. Ages 40 to 64  Blood pressure check.** / Every 1 to 2 years.  Lipid and cholesterol check.** / Every 5 years beginning at age 20.  Clinical breast exam.** / Every year after age 40.  Mammogram.** / Every year beginning at age 40   and continuing for as long as you are in good health. Consult with your  caregiver.  Pap test.** / Every 3 years starting at age 30 through age 65 or 70 with a history of 3 consecutive normal Pap tests.  HPV screening.** / Every 3 years from ages 30 through ages 65 to 70 with a history of 3 consecutive normal Pap tests.  Fecal occult blood test (FOBT) of stool. / Every year beginning at age 50 and continuing until age 75. You may not need to do this test if you get a colonoscopy every 10 years.  Flexible sigmoidoscopy or colonoscopy.** / Every 5 years for a flexible sigmoidoscopy or every 10 years for a colonoscopy beginning at age 50 and continuing until age 75.  Hepatitis C blood test.** / For all people born from 1945 through 1965 and any individual with known risks for hepatitis C.  Skin self-exam. / Monthly.  Influenza immunization.** / Every year.  Pneumococcal polysaccharide immunization.** / 1 to 2 doses if you smoke cigarettes or if you have certain chronic medical conditions.  Tetanus, diphtheria, pertussis (Tdap, Td) immunization.** / A one-time dose of Tdap vaccine. After that, you need a Td booster dose every 10 years.  Measles, mumps, rubella (MMR) immunization. / You need at least 1 dose of MMR if you were born in 1957 or later. You may also need a second dose.  Varicella immunization.** / Consult your caregiver.  Meningococcal immunization.** / Consult your caregiver.  Hepatitis A immunization.** / Consult your caregiver. 2 doses, 6 to 18 months apart.  Hepatitis B immunization.** / Consult your caregiver. 3 doses, usually over 6 months. Ages 65 and over  Blood pressure check.** / Every 1 to 2 years.  Lipid and cholesterol check.** / Every 5 years beginning at age 20.  Clinical breast exam.** / Every year after age 40.  Mammogram.** / Every year beginning at age 40 and continuing for as long as you are in good health. Consult with your caregiver.  Pap test.** / Every 3 years starting at age 30 through age 65 or 70 with a 3  consecutive normal Pap tests. Testing can be stopped between 65 and 70 with 3 consecutive normal Pap tests and no abnormal Pap or HPV tests in the past 10 years.  HPV screening.** / Every 3 years from ages 30 through ages 65 or 70 with a history of 3 consecutive normal Pap tests. Testing can be stopped between 65 and 70 with 3 consecutive normal Pap tests and no abnormal Pap or HPV tests in the past 10 years.  Fecal occult blood test (FOBT) of stool. / Every year beginning at age 50 and continuing until age 75. You may not need to do this test if you get a colonoscopy every 10 years.  Flexible sigmoidoscopy or colonoscopy.** / Every 5 years for a flexible sigmoidoscopy or every 10 years for a colonoscopy beginning at age 50 and continuing until age 75.  Hepatitis C blood test.** / For all people born from 1945 through 1965 and any individual with known risks for hepatitis C.  Osteoporosis screening.** / A one-time screening for women ages 65 and over and women at risk for fractures or osteoporosis.  Skin self-exam. / Monthly.  Influenza immunization.** / Every year.  Pneumococcal polysaccharide immunization.** / 1 dose at age 65 (or older) if you have never been vaccinated.  Tetanus, diphtheria, pertussis (Tdap, Td) immunization. / A one-time dose of Tdap vaccine if you are over   65 and have contact with an infant, are a healthcare worker, or simply want to be protected from whooping cough. After that, you need a Td booster dose every 10 years.  Varicella immunization.** / Consult your caregiver.  Meningococcal immunization.** / Consult your caregiver.  Hepatitis A immunization.** / Consult your caregiver. 2 doses, 6 to 18 months apart.  Hepatitis B immunization.** / Check with your caregiver. 3 doses, usually over 6 months. ** Family history and personal history of risk and conditions may change your caregiver's recommendations. Document Released: 10/17/2001 Document Revised: 11/13/2011  Document Reviewed: 01/16/2011 [employer] Patient Information 2013 [employer].  

## 2012-07-17 NOTE — Assessment & Plan Note (Signed)
Mild avoid trans fats, cont MegaRed and Crestor and increase exercise

## 2012-07-19 ENCOUNTER — Encounter: Payer: Self-pay | Admitting: Internal Medicine

## 2012-07-19 ENCOUNTER — Encounter: Payer: Self-pay | Admitting: Gastroenterology

## 2012-07-23 MED ORDER — METRONIDAZOLE 500 MG PO TABS: 500.0000 mg | ORAL_TABLET | Freq: Three times a day (TID) | ORAL | Status: DC

## 2012-07-23 NOTE — Addendum Note (Signed)
Addended by: Court Joy on: 07/23/2012 04:11 PM   Modules accepted: Orders

## 2012-07-23 NOTE — Progress Notes (Signed)
Quick Note:  Patient Informed and voiced understanding.  RX sent to pharmacy ______ 

## 2012-08-15 ENCOUNTER — Other Ambulatory Visit: Payer: Self-pay | Admitting: Family Medicine

## 2012-08-22 ENCOUNTER — Encounter: Payer: Self-pay | Admitting: Family Medicine

## 2012-08-22 ENCOUNTER — Ambulatory Visit (INDEPENDENT_AMBULATORY_CARE_PROVIDER_SITE_OTHER): Payer: BC Managed Care – PPO | Admitting: Family Medicine

## 2012-08-22 VITALS — BP 125/75 | HR 110 | Temp 100.4°F | Ht 65.0 in | Wt 232.0 lb

## 2012-08-22 DIAGNOSIS — J029 Acute pharyngitis, unspecified: Secondary | ICD-10-CM

## 2012-08-22 DIAGNOSIS — L270 Generalized skin eruption due to drugs and medicaments taken internally: Secondary | ICD-10-CM

## 2012-08-22 DIAGNOSIS — B370 Candidal stomatitis: Secondary | ICD-10-CM

## 2012-08-22 LAB — POCT RAPID STREP A (OFFICE): Rapid Strep A Screen: NEGATIVE

## 2012-08-22 MED ORDER — NYSTATIN 100000 UNIT/ML MT SUSP: OROMUCOSAL | Status: DC

## 2012-08-22 MED ORDER — LIDOCAINE VISCOUS 2 % MT SOLN: OROMUCOSAL | Status: DC

## 2012-08-22 MED ORDER — PREDNISONE 20 MG PO TABS: ORAL_TABLET | ORAL | Status: DC

## 2012-08-22 NOTE — Progress Notes (Addendum)
OFFICE NOTE  08/22/2012  CC:  Chief Complaint  Patient presents with  . Medication Reaction    ? to sulfa med started on Monday, took two days; having sore throat, tongue swelling, ulcers on tongue     HPI: Patient is a 61 y.o. Caucasian female who is here for oral complaints. Has hx of chronic/recurrent UTIs, has bactrim rx from her urologist to take prn--she last took this for UTI 1 yr ago. She started having urinary urgency and suprapubic pain c/w her usual UTI sx's and started the bactrim 4d/a.  Shortly after starting this she started having flushing/hives on face and upper trunk-nonpruritic, then soreness in mouth and throat followed.  She notes sores in her mouth.  No nasal congestion, runny nose, sneezing, or cough. She took a total of 3 bactrim DS tabs and then stopped the med: most recent dose was 2 d/a. Her skin lesions have regressed/faded, mouth continues to worsen.   Her urinary symptoms have gone away.  Pertinent PMH:  Past Medical History  Diagnosis Date  . Fibroids   . Abnormal vaginal Pap smear   . Anemia   . GERD (gastroesophageal reflux disease)   . Hyperlipidemia   . Hypertension   . Arthritis   . Anxiety   . Obesity   . Hiatal hernia   . Esophageal stricture 1994  . Chronic UTI     sees dr Vonita Moss  . Measles as a child  . Chicken pox as a child  . 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 Depression with anxiety 11/02/2009    Qualifier: Diagnosis of  By: Mayford Knife, LPN, Domenic Polite   . Plantar fasciitis of left foot 06/19/2012  . Dermatitis 07/17/2012   Past surgical, social, and family history reviewed and no changes noted since last office visit.  MEDS:  Outpatient Prescriptions Prior to Visit  Medication Sig Dispense Refill  . ALPRAZolam (XANAX) 0.5 MG tablet Take 1 tablet (0.5 mg total) by mouth 3 (three) times daily as needed for sleep or anxiety.  30 tablet  1  . CRESTOR 10 MG tablet take 1 tablet by mouth ON MONDAYS, WEDNESDAYS, AND FRIDAYS  15 tablet   6  . CYMBALTA 60 MG capsule take 1 capsule by mouth once daily  30 capsule  6  . Krill Oil CAPS 1 krill oil caps daily, MegaRed caps by Schiff      . metoprolol tartrate (LOPRESSOR) 25 MG tablet Take 12.5 mg by mouth daily.      Marland Kitchen nystatin cream (MYCOSTATIN) Apply topically 2 (two) times daily as needed for dry skin.  30 g  1  . omeprazole (PRILOSEC) 40 MG capsule take 1 capsule by mouth once daily  30 capsule  3  . telmisartan-hydrochlorothiazide (MICARDIS HCT) 80-25 MG per tablet 1/2 tab po daily  30 tablet  5  . metroNIDAZOLE (FLAGYL) 500 MG tablet Take 1 tablet (500 mg total) by mouth 3 (three) times daily.  15 tablet  0   Last reviewed on 08/22/2012  2:00 PM by Jeoffrey Massed, MD **Pt not taking Flagyl as listed above.  PE: Blood pressure 125/75, pulse 110, temperature 100.4 F (38 C), temperature source Temporal, height 5\' 5"  (1.651 m), weight 232 lb (105.235 kg), SpO2 97.00%. Gen: Alert, well appearing.  Patient is oriented to person, place, time, and situation. SKIN: splotchy erythematous macules on upper trunk, +blanchable.  No TTP. Mouth: lips without lesion.  Tongue with fairly significant ulcerations--some are well demarcated and some just splotchy/not  demarcated clearly.  Upper gingiva with similar ulcerations.  No bleeding.  Tongue with mild dingy film that scrapes off with tongue depressor.  IMPRESSION AND PLAN:  Drug eruption Stop bactrim. Start prednisone 40mg  qd x 5d, then 20mg  qd x 5d, then 10mg  qd x 6d. She appears to have some thrush on tongue--Nystatin susp 5ml qid x 14d rx'd for this. Viscous lidocaine 2% rx'd for application to painful ulcers on tongue for comfort.  Her UTI appears to have resolved.  No new abx started.     An After Visit Summary was printed and given to the patient.  FOLLOW UP: 2 wks

## 2012-08-24 LAB — CULTURE, GROUP A STREP: Organism ID, Bacteria: NORMAL

## 2012-08-26 ENCOUNTER — Other Ambulatory Visit: Payer: Self-pay | Admitting: Internal Medicine

## 2012-08-26 NOTE — Progress Notes (Signed)
Quick Note:  Patient Informed and voiced understanding ______ 

## 2012-08-27 DIAGNOSIS — L270 Generalized skin eruption due to drugs and medicaments taken internally: Secondary | ICD-10-CM | POA: Insufficient documentation

## 2012-08-27 NOTE — Assessment & Plan Note (Signed)
Stop bactrim. Start prednisone 40mg  qd x 5d, then 20mg  qd x 5d, then 10mg  qd x 6d. She appears to have some thrush on tongue--Nystatin susp 5ml qid x 14d rx'd for this. Viscous lidocaine 2% rx'd for application to painful ulcers on tongue for comfort.  Her UTI appears to have resolved.  No new abx started.

## 2012-09-05 ENCOUNTER — Ambulatory Visit (INDEPENDENT_AMBULATORY_CARE_PROVIDER_SITE_OTHER): Payer: BC Managed Care – PPO | Admitting: Family Medicine

## 2012-09-05 ENCOUNTER — Encounter: Payer: Self-pay | Admitting: Family Medicine

## 2012-09-05 VITALS — BP 109/72 | HR 62 | Temp 97.9°F | Ht 65.0 in | Wt 237.0 lb

## 2012-09-05 DIAGNOSIS — L270 Generalized skin eruption due to drugs and medicaments taken internally: Secondary | ICD-10-CM

## 2012-09-05 NOTE — Progress Notes (Signed)
OFFICE NOTE  09/05/2012  CC:  Chief Complaint  Patient presents with  . Follow-up    drug eruption, feeling better     HPI: Patient is a 62 y.o. Caucasian female who is here for f/u recent mucosal and skin hypersensitivity rxn to bactrim. She feels much better. All oral lesions resolved.  No rash. She has a couple of steroid doses left to take.  Pertinent PMH:  Past Medical History  Diagnosis Date  . Fibroids   . Abnormal vaginal Pap smear   . Anemia   . GERD (gastroesophageal reflux disease)   . Hyperlipidemia   . Hypertension   . Arthritis   . Anxiety   . Obesity   . Hiatal hernia   . Esophageal stricture 1994  . Chronic UTI     sees dr Vonita Moss  . Measles as a child  . Chicken pox as a child  . 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 Depression with anxiety 11/02/2009    Qualifier: Diagnosis of  By: Mayford Knife, LPN, Domenic Polite   . Plantar fasciitis of left foot 06/19/2012  . Dermatitis 07/17/2012    MEDS:  Outpatient Prescriptions Prior to Visit  Medication Sig Dispense Refill  . ALPRAZolam (XANAX) 0.5 MG tablet Take 1 tablet (0.5 mg total) by mouth 3 (three) times daily as needed for sleep or anxiety.  30 tablet  1  . CRESTOR 10 MG tablet take 1 tablet by mouth ON MONDAYS, WEDNESDAYS, AND FRIDAYS  30 each  6  . CYMBALTA 60 MG capsule take 1 capsule by mouth once daily  30 capsule  6  . Krill Oil CAPS 1 krill oil caps daily, MegaRed caps by Schiff      . lidocaine (XYLOCAINE) 2 % solution Apply with Q tip to tongue lesions prn pain  20 mL  1  . metoprolol tartrate (LOPRESSOR) 25 MG tablet Take 12.5 mg by mouth daily.      . metroNIDAZOLE (FLAGYL) 500 MG tablet Take 1 tablet (500 mg total) by mouth 3 (three) times daily.  15 tablet  0  . nystatin (MYCOSTATIN) 100000 UNIT/ML suspension 5ml po swish, gargle, and spit qid x 14d  480 mL  1  . nystatin cream (MYCOSTATIN) Apply topically 2 (two) times daily as needed for dry skin.  30 g  1  . omeprazole (PRILOSEC) 40 MG capsule  take 1 capsule by mouth once daily  30 capsule  3  . predniSONE (DELTASONE) 20 MG tablet 2 tabs po qd x 5d, then 1 tab po qd x 5d, then 1/2 tab po qd x 6d, then stop  18 tablet  0  . telmisartan-hydrochlorothiazide (MICARDIS HCT) 80-25 MG per tablet 1/2 tab po daily  30 tablet  5   Last reviewed on 09/05/2012  4:03 PM by Luisa Dago, CMA  PE: Blood pressure 109/72, pulse 62, temperature 97.9 F (36.6 C), temperature source Temporal, height 5\' 5"  (1.651 m), weight 237 lb (107.502 kg). Gen: Alert, well appearing.  Patient is oriented to person, place, time, and situation. Skin - no sores or suspicious lesions or rashes or color changes MOUTH: no lesions, no erythema, no tongue or gingival swelling.  IMPRESSION AND PLAN:  Drug eruption Resolved. Finish last couple of steroid doses. Bactrim/sulfa allergy has been noted in her chart now.   An After Visit Summary was printed and given to the patient.  FOLLOW UP: prn

## 2012-09-08 NOTE — Assessment & Plan Note (Signed)
Resolved. Finish last couple of steroid doses. Bactrim/sulfa allergy has been noted in her chart now.

## 2012-09-14 ENCOUNTER — Other Ambulatory Visit: Payer: Self-pay | Admitting: Internal Medicine

## 2012-09-17 ENCOUNTER — Encounter: Payer: BC Managed Care – PPO | Admitting: Gastroenterology

## 2012-09-19 ENCOUNTER — Ambulatory Visit (AMBULATORY_SURGERY_CENTER): Payer: BC Managed Care – PPO | Admitting: *Deleted

## 2012-09-19 VITALS — Ht 64.0 in | Wt 239.2 lb

## 2012-09-19 DIAGNOSIS — Z1211 Encounter for screening for malignant neoplasm of colon: Secondary | ICD-10-CM

## 2012-09-19 MED ORDER — MOVIPREP 100 G PO SOLR: 1.0000 | Freq: Once | ORAL | Status: DC

## 2012-09-19 NOTE — Progress Notes (Signed)
Previous GI history with colonoscopy at age 62 in [place].

## 2012-09-25 ENCOUNTER — Other Ambulatory Visit: Payer: Self-pay | Admitting: *Deleted

## 2012-09-25 MED ORDER — DULOXETINE HCL 60 MG PO CPEP: 60.0000 mg | ORAL_CAPSULE | Freq: Every day | ORAL | Status: DC

## 2012-10-03 ENCOUNTER — Encounter: Payer: Self-pay | Admitting: Gastroenterology

## 2012-10-03 ENCOUNTER — Ambulatory Visit (AMBULATORY_SURGERY_CENTER): Payer: BC Managed Care – PPO | Admitting: Gastroenterology

## 2012-10-03 VITALS — BP 118/73 | HR 72 | Temp 97.6°F | Resp 25 | Ht 64.0 in | Wt 239.0 lb

## 2012-10-03 DIAGNOSIS — Z1211 Encounter for screening for malignant neoplasm of colon: Secondary | ICD-10-CM

## 2012-10-03 DIAGNOSIS — D126 Benign neoplasm of colon, unspecified: Secondary | ICD-10-CM

## 2012-10-03 MED ORDER — SODIUM CHLORIDE 0.9 % IV SOLN: 500.0000 mL | INTRAVENOUS | Status: DC

## 2012-10-03 NOTE — Patient Instructions (Addendum)
YOU HAD AN ENDOSCOPIC PROCEDURE TODAY AT THE [facility]: Refer to the procedure report that was given to you for any specific questions about what was found during the examination.  If the procedure report does not answer your questions, please call your gastroenterologist to clarify.  If you requested that your care partner not be given the details of your procedure findings, then the procedure report has been included in a sealed envelope for you to review at your convenience later.  YOU SHOULD EXPECT: Some feelings of bloating in the abdomen. Passage of more gas than usual.  Walking can help get rid of the air that was put into your GI tract during the procedure and reduce the bloating. If you had a lower endoscopy (such as a colonoscopy or flexible sigmoidoscopy) you may notice spotting of blood in your stool or on the toilet paper. If you underwent a bowel prep for your procedure, then you may not have a normal bowel movement for a few days.  DIET: Your first meal following the procedure should be a light meal and then it is ok to progress to your normal diet.  A half-sandwich or bowl of soup is an example of a good first meal.  Heavy or fried foods are harder to digest and may make you feel nauseous or bloated.  Likewise meals heavy in dairy and vegetables can cause extra gas to form and this can also increase the bloating.  Drink plenty of fluids but you should avoid alcoholic beverages for 24 hours.  ACTIVITY: Your care partner should take you home directly after the procedure.  You should plan to take it easy, moving slowly for the rest of the day.  You can resume normal activity the day after the procedure however you should NOT DRIVE or use heavy machinery for 24 hours (because of the sedation medicines used during the test).    SYMPTOMS TO REPORT IMMEDIATELY: A gastroenterologist can be reached at any hour.  During normal business hours, 8:30 AM to 5:00 PM Monday through Friday,  call [phone].  After hours and on weekends, please call the GI answering service at [phone] who will take a message and have the physician on call contact you.   Following lower endoscopy (colonoscopy or flexible sigmoidoscopy):  Excessive amounts of blood in the stool  Significant tenderness or worsening of abdominal pains  Swelling of the abdomen that is new, acute  Fever of 100F or higher    FOLLOW UP: If any biopsies were taken you will be contacted by phone or by letter within the next 1-3 weeks.  Call your gastroenterologist if you have not heard about the biopsies in 3 weeks.  Our staff will call the home number listed on your records the next business day following your procedure to check on you and address any questions or concerns that you may have at that time regarding the information given to you following your procedure. This is a courtesy call and so if there is no answer at the home number and we have not heard from you through the emergency physician on call, we will assume that you have returned to your regular daily activities without incident.  SIGNATURES/CONFIDENTIALITY: You and/or your care partner have signed paperwork which will be entered into your electronic medical record.  These signatures attest to the fact that that the information above on your After Visit Summary has been reviewed and is understood.  Full responsibility of the confidentiality   of this discharge information lies with you and/or your care-partner.    Information on polyps given to you today 

## 2012-10-03 NOTE — Progress Notes (Addendum)
Patient did not have preoperative order for IV antibiotic SSI prophylaxis. (G8918)  Patient did not experience any of the following events: a burn prior to discharge; a fall within the facility; wrong site/side/patient/procedure/implant event; or a hospital transfer or hospital admission upon discharge from the facility. (G8907)  

## 2012-10-03 NOTE — Op Note (Signed)
[facility] [address] [place], [address]   COLONOSCOPY PROCEDURE REPORT  PATIENT: Kristin [name], Kristin [name]  MR#: [id number] BIRTHDATE: December 19, 1950 , 61  yrs. old GENDER: Female ENDOSCOPIST: Meryl Dare, MD, [facility] PROCEDURE DATE:  10/03/2012 PROCEDURE:   Colonoscopy with snare polypectomy ASA CLASS:   Class II INDICATIONS:average risk screening. MEDICATIONS: MAC sedation, administered by CRNA and propofol (Diprivan) 300mg  IV DESCRIPTION OF PROCEDURE:   After the risks benefits and alternatives of the procedure were thoroughly explained, informed consent was obtained.  A digital rectal exam revealed no abnormalities of the rectum.   The LB [id number]  endoscope was introduced through the anus and advanced to the cecum, which was identified by both the appendix and ileocecal valve. No adverse events experienced.   The quality of the prep was excellent, using MoviPrep  The instrument was then slowly withdrawn as the colon was fully examined.   COLON FINDINGS: A sessile polyp measuring 6 mm in size was found in the transverse colon.  A polypectomy was performed with a cold snare.  The resection was complete and the polyp tissue was completely retrieved.   The colon was otherwise normal.  There was no diverticulosis, inflammation, polyps or cancers unless previously stated.  Retroflexed views revealed no abnormalities. The time to cecum=3 minutes 46 seconds.  Withdrawal time=11 minutes 27 seconds.  The scope was withdrawn and the procedure completed.  COMPLICATIONS: There were no complications.  ENDOSCOPIC IMPRESSION: 1.   Sessile polyp measuring 6 mm in size was found in the transverse colon; polypectomy was performed with a cold snare 2.   The colon was otherwise normal  RECOMMENDATIONS: 1.  Await pathology results 2.  Repeat colonoscopy in 5 years if polyp adenomatous; otherwise 10 years   eSigned:  Meryl Dare, MD, [facility] 10/03/2012 11:46  AM

## 2012-10-03 NOTE — Progress Notes (Signed)
Called to room to assist during endoscopic procedure.  Patient ID and intended procedure confirmed with present staff. Received instructions for my participation in the procedure from the performing physician.  

## 2012-10-04 ENCOUNTER — Telehealth: Payer: Self-pay

## 2012-10-04 NOTE — Telephone Encounter (Signed)
  Follow up Call-  Call back number 10/03/2012  Post procedure Call Back phone  # [phone]  Permission to leave phone message Yes     Patient questions:  Do you have a fever, pain , or abdominal swelling? no Pain Score  0 *  Have you tolerated food without any problems? yes  Have you been able to return to your normal activities? yes  Do you have any questions about your discharge instructions: Diet   no Medications  no Follow up visit  no  Do you have questions or concerns about your Care? no  Actions: * If pain score is 4 or above: No action needed, pain <4.   Per the pt's husband, she was not available.  He said she did not have any problems. Maw

## 2012-10-07 ENCOUNTER — Other Ambulatory Visit: Payer: Self-pay | Admitting: Gastroenterology

## 2012-10-07 DIAGNOSIS — D126 Benign neoplasm of colon, unspecified: Secondary | ICD-10-CM

## 2012-10-09 ENCOUNTER — Encounter: Payer: Self-pay | Admitting: Gastroenterology

## 2012-11-14 ENCOUNTER — Other Ambulatory Visit: Payer: Self-pay

## 2012-11-14 DIAGNOSIS — Z1231 Encounter for screening mammogram for malignant neoplasm of breast: Secondary | ICD-10-CM

## 2012-11-28 ENCOUNTER — Ambulatory Visit (INDEPENDENT_AMBULATORY_CARE_PROVIDER_SITE_OTHER): Payer: BC Managed Care – PPO | Admitting: Family Medicine

## 2012-11-28 ENCOUNTER — Encounter: Payer: Self-pay | Admitting: Family Medicine

## 2012-11-28 VITALS — BP 122/72 | HR 104 | Temp 98.4°F | Ht 65.0 in | Wt 233.1 lb

## 2012-11-28 DIAGNOSIS — F411 Generalized anxiety disorder: Secondary | ICD-10-CM

## 2012-11-28 DIAGNOSIS — F418 Other specified anxiety disorders: Secondary | ICD-10-CM

## 2012-11-28 DIAGNOSIS — I10 Essential (primary) hypertension: Secondary | ICD-10-CM

## 2012-11-28 DIAGNOSIS — K219 Gastro-esophageal reflux disease without esophagitis: Secondary | ICD-10-CM

## 2012-11-28 DIAGNOSIS — F419 Anxiety disorder, unspecified: Secondary | ICD-10-CM

## 2012-11-28 DIAGNOSIS — R000 Tachycardia, unspecified: Secondary | ICD-10-CM

## 2012-11-28 DIAGNOSIS — R251 Tremor, unspecified: Secondary | ICD-10-CM

## 2012-11-28 DIAGNOSIS — F341 Dysthymic disorder: Secondary | ICD-10-CM

## 2012-11-28 DIAGNOSIS — R259 Unspecified abnormal involuntary movements: Secondary | ICD-10-CM

## 2012-11-28 MED ORDER — ALPRAZOLAM 0.5 MG PO TABS: 0.5000 mg | ORAL_TABLET | Freq: Three times a day (TID) | ORAL | Status: DC | PRN

## 2012-11-28 MED ORDER — METOPROLOL TARTRATE 25 MG PO TABS: 12.5000 mg | ORAL_TABLET | Freq: Two times a day (BID) | ORAL | Status: DC

## 2012-11-28 MED ORDER — VENLAFAXINE HCL ER 150 MG PO CP24: 150.0000 mg | ORAL_CAPSULE | Freq: Every day | ORAL | Status: DC

## 2012-11-28 NOTE — Assessment & Plan Note (Signed)
Well controlled, no changes today 

## 2012-11-28 NOTE — Assessment & Plan Note (Signed)
Feels this has worsened but is aware stress is playing a role. Will treat stress and then will reassess

## 2012-11-28 NOTE — Assessment & Plan Note (Addendum)
recnet increased stress, considering retiring or cutting back at work. For now will give refill on Alprazolam and switch from Cymbalta to Effexor XR 150 mg daily, consider counseling. MDQ screen for bipolar disorder was negative today only answered yes to 2 of 13 questions and no to questions 2,3,5. Yes to # 4

## 2012-11-28 NOTE — Patient Instructions (Addendum)

## 2012-12-01 ENCOUNTER — Encounter: Payer: Self-pay | Admitting: Family Medicine

## 2012-12-01 NOTE — Assessment & Plan Note (Signed)
Well controlled, avoid offending foods. Continue Omeprazole as needed.

## 2012-12-01 NOTE — Progress Notes (Signed)
Patient ID: Kristin [name], female   DOB: 1950-10-11, 62 y.o.   MRN: [id number] Kristin [name] [id number] 1950-11-06 12/01/2012      Progress Note-Follow Up  Subjective  Chief Complaint  Chief Complaint  Patient presents with  . Follow-up    HPI  Patient is a 62 year old female in today for followup. Reports Wellbutrin is helpful and she is struggling with anxiety and depression. Denies any suicidal ideation. Has had significant stressors mostly at work. Is taking her other medications as prescribed and denies any recent illness. No fevers or chills. No chest palpitations. No shortness or breath GI or GU concerns noted.  Past Medical History  Diagnosis Date  . Fibroids   . Abnormal vaginal Pap smear   . Anemia   . GERD (gastroesophageal reflux disease)   . Hyperlipidemia   . Hypertension   . Arthritis   . Anxiety   . Obesity   . Hiatal hernia   . Esophageal stricture 1994  . Chronic UTI     sees dr Vonita Moss  . Measles as a child  . Chicken pox as a child  . 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 Depression with anxiety 11/02/2009    Qualifier: Diagnosis of  By: Mayford Knife, LPN, Domenic Polite   . Plantar fasciitis of left foot 06/19/2012  . Dermatitis 07/17/2012    Past Surgical History  Procedure Laterality Date  . Wisdom teeth extracted    . Laporoscopy    . Tonsillectomy    . Abdominal hysterectomy  2006    total  . Partial hip arthroplasty  2010    right  . Esophageal      stretching    Family History  Problem Relation Age of Onset  . Cancer Mother     breast  . Other Mother     arrythmia  . Mental illness Mother     bipolar  . Heart disease Father   . Arthritis Father     rheumatoid  . Depression Sister   . Arthritis Sister   . Colon cancer Neg Hx   . Esophageal cancer Neg Hx   . Rectal cancer Neg Hx   . Stomach cancer Neg Hx     History   Social History  . Marital Status: Married    Spouse Name: N/A    Number of Children: N/A  . Years of Education:  N/A   Occupational History  . Not on file.   Social History Main Topics  . Smoking status: Never Smoker   . Smokeless tobacco: Never Used  . Alcohol Use: 2.4 oz/week    4 Glasses of wine per week     Comment: 4 glasses of wine weekly  . Drug Use: No  . Sexually Active: Yes -- Female partner(s)   Other Topics Concern  . Not on file   Social History Narrative  . No narrative on file    Current Outpatient Prescriptions on File Prior to Visit  Medication Sig Dispense Refill  . CRESTOR 10 MG tablet take 1 tablet by mouth ON MONDAYS, WEDNESDAYS, AND FRIDAYS  30 each  6  . Krill Oil CAPS 1 krill oil caps daily, MegaRed caps by Schiff      . omeprazole (PRILOSEC) 40 MG capsule take 1 capsule by mouth once daily  30 capsule  3  . telmisartan-hydrochlorothiazide (MICARDIS HCT) 80-25 MG per tablet 1/2 tab po daily  30 tablet  5   No current facility-administered medications on file prior to  visit.    Allergies  Allergen Reactions  . Bactrim (Sulfamethoxazole W-Trimethoprim) Other (See Comments)    Oral ulcers and rash  . Penicillins Hives    Review of Systems  Review of Systems  Constitutional: Negative for fever and malaise/fatigue.  HENT: Negative for congestion.   Eyes: Negative for discharge.  Respiratory: Negative for shortness of breath.   Cardiovascular: Negative for chest pain, palpitations and leg swelling.  Gastrointestinal: Negative for nausea, abdominal pain and diarrhea.  Genitourinary: Negative for dysuria.  Musculoskeletal: Negative for falls.  Skin: Negative for rash.  Neurological: Negative for loss of consciousness and headaches.  Endo/Heme/Allergies: Negative for polydipsia.  Psychiatric/Behavioral: Positive for depression. Negative for suicidal ideas. The patient is nervous/anxious. The patient does not have insomnia.     Objective  BP 122/72  Pulse 104  Temp(Src) 98.4 F (36.9 C) (Oral)  Ht 5\' 5"  (1.651 m)  Wt 233 lb 1.3 oz (105.724 kg)  BMI  38.79 kg/m2  SpO2 95%  Physical Exam  Physical Exam  Constitutional: She is oriented to person, place, and time and well-developed, well-nourished, and in no distress. No distress.  HENT:  Head: Normocephalic and atraumatic.  Eyes: Conjunctivae are normal.  Neck: Neck supple. No thyromegaly present.  Cardiovascular: Normal rate, regular rhythm and normal heart sounds.   No murmur heard. Pulmonary/Chest: Effort normal and breath sounds normal. She has no wheezes.  Abdominal: She exhibits no distension and no mass.  Musculoskeletal: She exhibits no edema.  Lymphadenopathy:    She has no cervical adenopathy.  Neurological: She is alert and oriented to person, place, and time.  Skin: Skin is warm and dry. No rash noted. She is not diaphoretic.  Psychiatric: Memory, affect and judgment normal.    Lab Results  Component Value Date   TSH 0.74 02/15/2012   Lab Results  Component Value Date   WBC 6.4 02/15/2012   HGB 12.8 02/15/2012   HCT 38.2 02/15/2012   MCV 88.1 02/15/2012   PLT 232.0 02/15/2012   Lab Results  Component Value Date   CREATININE 0.8 02/15/2012   BUN 17 02/15/2012   NA 143 02/15/2012   K 4.5 02/15/2012   CL 106 02/15/2012   CO2 30 02/15/2012   Lab Results  Component Value Date   ALT 21 02/15/2012   AST 21 02/15/2012   ALKPHOS 66 02/15/2012   BILITOT 0.7 02/15/2012   Lab Results  Component Value Date   CHOL 177 02/15/2012   Lab Results  Component Value Date   HDL 54.10 02/15/2012   Lab Results  Component Value Date   LDLCALC 92 02/15/2012   Lab Results  Component Value Date   TRIG 155.0* 02/15/2012   Lab Results  Component Value Date   CHOLHDL 3 02/15/2012     Assessment & Plan  HYPERTENSION Well controlled, no changes today.   Depression with anxiety recnet increased stress, considering retiring or cutting back at work. For now will give refill on Alprazolam and switch from Cymbalta to Effexor XR 150 mg daily, consider counseling. MDQ screen for  bipolar disorder was negative today only answered yes to 2 of 13 questions and no to questions 2,3,5. Yes to # 4  Tremor Feels this has worsened but is aware stress is playing a role. Will treat stress and then will reassess  GERD Well controlled, avoid offending foods. Continue Omeprazole as needed.

## 2012-12-09 ENCOUNTER — Ambulatory Visit
Admission: RE | Admit: 2012-12-09 | Discharge: 2012-12-09 | Disposition: A | Discharge status: Home / Self Care | Payer: BC Managed Care – PPO | Source: Ambulatory Visit

## 2012-12-09 DIAGNOSIS — Z1231 Encounter for screening mammogram for malignant neoplasm of breast: Secondary | ICD-10-CM

## 2012-12-11 ENCOUNTER — Other Ambulatory Visit: Payer: Self-pay | Admitting: Family Medicine

## 2013-01-06 ENCOUNTER — Telehealth: Payer: Self-pay

## 2013-01-06 NOTE — Telephone Encounter (Signed)
Pt called call a nurse on 01-04-13 at 3:34pm stating she was told she would need abx prior to dental procedures. Pt has not had a dental procedure since 2012 but is scheduled for a crown on Monday 01-06-13 at 7:30 am and just remembered about the abx. RN spoke to MD on call Dr Tawanna Cooler, who stated newer studies indicate it is better not to take the prophylactic abx in her case and that she does not need to take any abx prior to dental procedure. RN relayed message to pt who was happy to hear this news and prefers not to take abx unless absolutely necesary

## 2013-01-23 ENCOUNTER — Ambulatory Visit: Payer: BC Managed Care – PPO | Admitting: Family Medicine

## 2013-01-29 ENCOUNTER — Telehealth: Payer: Self-pay | Admitting: Family Medicine

## 2013-01-29 ENCOUNTER — Other Ambulatory Visit: Payer: Self-pay | Admitting: Family Medicine

## 2013-01-29 DIAGNOSIS — F419 Anxiety disorder, unspecified: Secondary | ICD-10-CM

## 2013-01-29 MED ORDER — VENLAFAXINE HCL ER 150 MG PO CP24: 150.0000 mg | ORAL_CAPSULE | Freq: Every day | ORAL | Status: DC

## 2013-01-29 NOTE — Telephone Encounter (Signed)
Refill- venlafaxine hcl er 150mg  cap. Take one capsule by mouth daily. Qty 30 last fill 4.28.14

## 2013-02-10 ENCOUNTER — Ambulatory Visit (HOSPITAL_BASED_OUTPATIENT_CLINIC_OR_DEPARTMENT_OTHER)
Admission: RE | Admit: 2013-02-10 | Discharge: 2013-02-10 | Disposition: A | Discharge status: Home / Self Care | Payer: BC Managed Care – PPO | Source: Ambulatory Visit | Attending: Family Medicine | Admitting: Family Medicine

## 2013-02-10 ENCOUNTER — Ambulatory Visit (INDEPENDENT_AMBULATORY_CARE_PROVIDER_SITE_OTHER): Payer: BC Managed Care – PPO | Admitting: Family Medicine

## 2013-02-10 ENCOUNTER — Encounter: Payer: Self-pay | Admitting: Family Medicine

## 2013-02-10 VITALS — BP 102/68 | HR 89 | Temp 98.5°F | Ht 65.0 in | Wt 238.0 lb

## 2013-02-10 DIAGNOSIS — E785 Hyperlipidemia, unspecified: Secondary | ICD-10-CM

## 2013-02-10 DIAGNOSIS — M25569 Pain in unspecified knee: Secondary | ICD-10-CM | POA: Insufficient documentation

## 2013-02-10 DIAGNOSIS — R209 Unspecified disturbances of skin sensation: Secondary | ICD-10-CM | POA: Insufficient documentation

## 2013-02-10 DIAGNOSIS — F419 Anxiety disorder, unspecified: Secondary | ICD-10-CM

## 2013-02-10 DIAGNOSIS — F418 Other specified anxiety disorders: Secondary | ICD-10-CM

## 2013-02-10 DIAGNOSIS — R251 Tremor, unspecified: Secondary | ICD-10-CM

## 2013-02-10 DIAGNOSIS — F32A Depression, unspecified: Secondary | ICD-10-CM

## 2013-02-10 DIAGNOSIS — S81009A Unspecified open wound, unspecified knee, initial encounter: Secondary | ICD-10-CM

## 2013-02-10 DIAGNOSIS — S83106A Unspecified dislocation of unspecified knee, initial encounter: Secondary | ICD-10-CM

## 2013-02-10 DIAGNOSIS — M1990 Unspecified osteoarthritis, unspecified site: Secondary | ICD-10-CM

## 2013-02-10 DIAGNOSIS — R259 Unspecified abnormal involuntary movements: Secondary | ICD-10-CM

## 2013-02-10 DIAGNOSIS — E669 Obesity, unspecified: Secondary | ICD-10-CM

## 2013-02-10 DIAGNOSIS — I10 Essential (primary) hypertension: Secondary | ICD-10-CM

## 2013-02-10 DIAGNOSIS — F341 Dysthymic disorder: Secondary | ICD-10-CM

## 2013-02-10 DIAGNOSIS — F329 Major depressive disorder, single episode, unspecified: Secondary | ICD-10-CM

## 2013-02-10 MED ORDER — VENLAFAXINE HCL ER 150 MG PO CP24: 150.0000 mg | ORAL_CAPSULE | Freq: Every day | ORAL | Status: DC

## 2013-02-10 MED ORDER — VENLAFAXINE HCL ER 75 MG PO CP24: 75.0000 mg | ORAL_CAPSULE | Freq: Every day | ORAL | Status: DC

## 2013-02-10 NOTE — Patient Instructions (Addendum)
Next visit annual with labs, liver, renal, tsh, hepatic, cbc Aspercreme twice a day/Salon Pas patches   Tremor Tremor is a rhythmic, involuntary muscular contraction characterized by oscillations (to-and-fro movements) of a part of the body. The most common of all involuntary movements, tremor can affect various body parts such as the hands, head, facial structures, vocal cords, trunk, and legs; most tremors, however, occur in the hands. Tremor often accompanies neurological disorders associated with aging. Although the disorder is not life-threatening, it can be responsible for functional disability and social embarrassment. TREATMENT  There are many types of tremor and several ways in which tremor is classified. The most common classification is by behavioral context or position. There are five categories of tremor within this classification: resting, postural, kinetic, task-specific, and psychogenic. Resting or static tremor occurs when the muscle is at rest, for example when the hands are lying on the lap. This type of tremor is often seen in patients with Parkinson's disease. Postural tremor occurs when a patient attempts to maintain posture, such as holding the hands outstretched. Postural tremors include physiological tremor, essential tremor, tremor with basal ganglia disease (also seen in patients with Parkinson's disease), cerebellar postural tremor, tremor with peripheral neuropathy, post-traumatic tremor, and alcoholic tremor. Kinetic or intention (action) tremor occurs during purposeful movement, for example during finger-to-nose testing. Task-specific tremor appears when performing goal-oriented tasks such as handwriting, speaking, or standing. This group consists of primary writing tremor, vocal tremor, and orthostatic tremor. Psychogenic tremor occurs in both older and younger patients. The key feature of this tremor is that it dramatically lessens or disappears when the patient is  distracted. PROGNOSIS There are some treatment options available for tremor; the appropriate treatment depends on accurate diagnosis of the cause. Some tremors respond to treatment of the underlying condition, for example in some cases of psychogenic tremor treating the patient's underlying mental problem may cause the tremor to disappear. Also, patients with tremor due to Parkinson's disease may be treated with Levodopa drug therapy. Symptomatic drug therapy is available for several other tremors as well. For those cases of tremor in which there is no effective drug treatment, physical measures such as teaching the patient to brace the affected limb during the tremor are sometimes useful. Surgical intervention such as thalamotomy or deep brain stimulation may be useful in certain cases. Document Released: 08/11/2002 Document Revised: 11/13/2011 Document Reviewed: 08/21/2005 [facility] Patient Information 2014 [place], [place].

## 2013-02-11 NOTE — Assessment & Plan Note (Signed)
Complaining of a new patch size of a quarter on the lateral aspect of her left knee. Denies any recent trauma. X-rays unremarkable. She is encouraged to try topical treatments such as Aspercreme twice a day and to let us know if symptoms do not improve

## 2013-02-11 NOTE — Assessment & Plan Note (Signed)
tolertating Crestor, minimize simple carbs and trans fats.

## 2013-02-11 NOTE — Assessment & Plan Note (Signed)
bp well controlled no changes

## 2013-02-11 NOTE — Progress Notes (Signed)
Patient ID: Kristin [name], female   DOB: [birth year], 62 y.o.   MRN: [id number] Kristin [name] [id number] 1951/06/14 02/11/2013      Progress Note-Follow Up  Subjective  Chief Complaint  Chief Complaint  Patient presents with  . Follow-up    HPI  Patient is a 62 year old Caucasian female who is in today for followup she is under a great deal of stress both at work and at home. Mother is gravely ill in another state and is being placed on hospice. She notes when she feels more stressed her tremors worsen. She now she's not been eating more exercising correctly. Her largest complaint is a numb patch on the left side of her knee. They present about a month and she denies any trauma redness warmth or swelling. No other radicular symptoms. No chest pain, palpitations, shortness or breath, fevers, headaches or other physical concerns. Is taking medications as prescribed  Past Medical History  Diagnosis Date  . Fibroids   . Abnormal vaginal Pap smear   . Anemia   . GERD (gastroesophageal reflux disease)   . Hyperlipidemia   . Hypertension   . Arthritis   . Anxiety   . Obesity   . Hiatal hernia   . Esophageal stricture 1994  . Chronic UTI     sees dr Vonita Moss  . Measles as a child  . Chicken pox as a child  . 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 Depression with anxiety 11/02/2009    Qualifier: Diagnosis of  By: Mayford Knife, LPN, Domenic Polite   . Plantar fasciitis of left foot 06/19/2012  . Dermatitis 07/17/2012    Past Surgical History  Procedure Laterality Date  . Wisdom teeth extracted    . Laporoscopy    . Tonsillectomy    . Abdominal hysterectomy  2006    total  . Partial hip arthroplasty  2010    right  . Esophageal      stretching    Family History  Problem Relation Age of Onset  . Cancer Mother     breast  . Other Mother     arrythmia  . Mental illness Mother     bipolar  . Heart disease Father   . Arthritis Father     rheumatoid  . Depression Sister   . Arthritis  Sister   . Colon cancer Neg Hx   . Esophageal cancer Neg Hx   . Rectal cancer Neg Hx   . Stomach cancer Neg Hx     History   Social History  . Marital Status: Married    Spouse Name: N/A    Number of Children: N/A  . Years of Education: N/A   Occupational History  . Not on file.   Social History Main Topics  . Smoking status: Never Smoker   . Smokeless tobacco: Never Used  . Alcohol Use: 2.4 oz/week    4 Glasses of wine per week     Comment: 4 glasses of wine weekly  . Drug Use: No  . Sexually Active: Yes -- Female partner(s)   Other Topics Concern  . Not on file   Social History Narrative  . No narrative on file    Current Outpatient Prescriptions on File Prior to Visit  Medication Sig Dispense Refill  . ALPRAZolam (XANAX) 0.5 MG tablet Take 1 tablet (0.5 mg total) by mouth 3 (three) times daily as needed for sleep or anxiety.  30 tablet  1  . CRESTOR 10 MG tablet take 1  tablet by mouth ON MONDAYS, WEDNESDAYS, AND FRIDAYS  30 each  6  . Krill Oil CAPS 1 krill oil caps daily, MegaRed caps by Schiff      . metoprolol tartrate (LOPRESSOR) 25 MG tablet Take 0.5 tablets (12.5 mg total) by mouth 2 (two) times daily.  30 tablet  2  . omeprazole (PRILOSEC) 40 MG capsule take 1 capsule by mouth once daily  30 capsule  3  . telmisartan-hydrochlorothiazide (MICARDIS HCT) 80-25 MG per tablet TAKE 1/2 TABLET BY MOUTH DAILY  30 tablet  2   No current facility-administered medications on file prior to visit.    Allergies  Allergen Reactions  . Bactrim (Sulfamethoxazole W-Trimethoprim) Other (See Comments)    Oral ulcers and rash  . Penicillins Hives    Review of Systems  Review of Systems  Constitutional: Positive for malaise/fatigue. Negative for fever.  HENT: Negative for congestion.   Eyes: Negative for discharge.  Respiratory: Negative for shortness of breath.   Cardiovascular: Negative for chest pain, palpitations and leg swelling.  Gastrointestinal: Negative for  nausea, abdominal pain and diarrhea.  Genitourinary: Negative for dysuria.  Musculoskeletal: Negative for falls.  Skin: Negative for rash.  Neurological: Positive for tremors. Negative for loss of consciousness and headaches.  Endo/Heme/Allergies: Negative for polydipsia.  Psychiatric/Behavioral: Positive for depression. Negative for suicidal ideas. The patient is nervous/anxious. The patient does not have insomnia.     Objective  BP 102/68  Pulse 89  Temp(Src) 98.5 F (36.9 C) (Oral)  Ht 5\' 5"  (1.651 m)  Wt 238 lb 0.6 oz (107.974 kg)  BMI 39.61 kg/m2  SpO2 95%  Physical Exam  Physical Exam  Constitutional: She is oriented to person, place, and time and well-developed, well-nourished, and in no distress. No distress.  HENT:  Head: Normocephalic and atraumatic.  Eyes: Conjunctivae are normal.  Neck: Neck supple. No thyromegaly present.  Cardiovascular: Normal rate, regular rhythm and normal heart sounds.   No murmur heard. Pulmonary/Chest: Effort normal and breath sounds normal. She has no wheezes.  Abdominal: She exhibits no distension and no mass.  Musculoskeletal: She exhibits no edema.  Lymphadenopathy:    She has no cervical adenopathy.  Neurological: She is alert and oriented to person, place, and time.  Skin: Skin is warm and dry. No rash noted. She is not diaphoretic.  Psychiatric: Memory, affect and judgment normal.    Lab Results  Component Value Date   TSH 0.74 02/15/2012   Lab Results  Component Value Date   WBC 6.4 02/15/2012   HGB 12.8 02/15/2012   HCT 38.2 02/15/2012   MCV 88.1 02/15/2012   PLT 232.0 02/15/2012   Lab Results  Component Value Date   CREATININE 0.8 02/15/2012   BUN 17 02/15/2012   NA 143 02/15/2012   K 4.5 02/15/2012   CL 106 02/15/2012   CO2 30 02/15/2012   Lab Results  Component Value Date   ALT 21 02/15/2012   AST 21 02/15/2012   ALKPHOS 66 02/15/2012   BILITOT 0.7 02/15/2012   Lab Results  Component Value Date   CHOL 177 02/15/2012    Lab Results  Component Value Date   HDL 54.10 02/15/2012   Lab Results  Component Value Date   LDLCALC 92 02/15/2012   Lab Results  Component Value Date   TRIG 155.0* 02/15/2012   Lab Results  Component Value Date   CHOLHDL 3 02/15/2012     Assessment & Plan  OSTEOARTHRITIS Complaining of a new patch  size of a quarter on the lateral aspect of her left knee. Denies any recent trauma. X-rays unremarkable. She is encouraged to try topical treatments such as Aspercreme twice a day and to let us know if symptoms do not improve  HYPERTENSION bp well controlled no changes  HYPERLIPIDEMIA tolertating Crestor, minimize simple carbs and trans fats.  Depression with anxiety Struggling with multiple stressors. Notes a lot of stress at work but also her mom is gavely ill and has been placed in hospice. Will try increasing Effexor XR to a 150 mg in a.m. And 75 mg in p.m. May use alprazolam when necessary  Tremor Worse with stress but tolerable encouraged to try low dose Alprazolam for this prn  OBESITY, UNSPECIFIED Encouraged DASH diet and increased exercise if at all possible

## 2013-02-11 NOTE — Assessment & Plan Note (Signed)
Worse with stress but tolerable encouraged to try low dose Alprazolam for this prn

## 2013-02-11 NOTE — Assessment & Plan Note (Addendum)
Struggling with multiple stressors. Notes a lot of stress at work but also her mom is gavely ill and has been placed in hospice. Will try increasing Effexor XR to a 150 mg in a.m. And 75 mg in p.m. May use alprazolam when necessary

## 2013-02-11 NOTE — Assessment & Plan Note (Signed)
Encouraged DASH diet and increased exercise if at all possible

## 2013-02-25 ENCOUNTER — Encounter: Payer: Self-pay | Admitting: Family Medicine

## 2013-02-25 ENCOUNTER — Ambulatory Visit (INDEPENDENT_AMBULATORY_CARE_PROVIDER_SITE_OTHER): Payer: BC Managed Care – PPO | Admitting: Family Medicine

## 2013-02-25 VITALS — BP 98/70 | HR 66 | Temp 98.3°F | Ht 65.0 in | Wt 232.1 lb

## 2013-02-25 DIAGNOSIS — R350 Frequency of micturition: Secondary | ICD-10-CM

## 2013-02-25 DIAGNOSIS — J029 Acute pharyngitis, unspecified: Secondary | ICD-10-CM | POA: Insufficient documentation

## 2013-02-25 DIAGNOSIS — I10 Essential (primary) hypertension: Secondary | ICD-10-CM

## 2013-02-25 HISTORY — DX: Acute pharyngitis, unspecified: J02.9

## 2013-02-25 HISTORY — DX: Frequency of micturition: R35.0

## 2013-02-25 MED ORDER — CIPROFLOXACIN HCL 500 MG PO TABS: 500.0000 mg | ORAL_TABLET | Freq: Two times a day (BID) | ORAL | Status: DC

## 2013-02-25 NOTE — Progress Notes (Signed)
Patient ID: Kristin [name], female   DOB: 01-04-1951, 62 y.o.   MRN: [id number] Kristin [name] [id number] 07/28/1951 02/25/2013      Progress Note-Follow Up  Subjective  Chief Complaint  Chief Complaint  Patient presents with  . Cystitis    urgency, pressure, can't hold urine X 2 days    HPI  Patient is a 62 year old Caucasian female who is in today with numerous concerns. She's flying [place] this coming weekend and has been struggling with worsening sore throat for about 3 weeks now. Does acknowledge malaise and myalgias as well but denies fevers and chills. Has had some mild nasal congestion some ear pressure. Notes some postnasal drip and cough only productive in the a.m. Denies chest pain, palpitations, shortness of breath. Just today began having loose stool. Reports roughly 6 episodes of loose stool this morning. No bloody or tarry stool. Denies back and abdominal pain. Over the last 2 days has developed urinary frequency, urgency but no dysuria or hematuria  Past Medical History  Diagnosis Date  . Fibroids   . Abnormal vaginal Pap smear   . Anemia   . GERD (gastroesophageal reflux disease)   . Hyperlipidemia   . Hypertension   . Arthritis   . Anxiety   . Obesity   . Hiatal hernia   . Esophageal stricture 1994  . Chronic UTI     sees dr Vonita Moss  . Measles as a child  . Chicken pox as a child  . 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 Depression with anxiety 11/02/2009    Qualifier: Diagnosis of  By: Mayford Knife, LPN, Domenic Polite   . Plantar fasciitis of left foot 06/19/2012  . Dermatitis 07/17/2012  . Urinary frequency 02/25/2013  . Acute pharyngitis 02/25/2013    Past Surgical History  Procedure Laterality Date  . Wisdom teeth extracted    . Laporoscopy    . Tonsillectomy    . Abdominal hysterectomy  2006    total  . Partial hip arthroplasty  2010    right  . Esophageal      stretching    Family History  Problem Relation Age of Onset  . Cancer Mother     breast  .  Other Mother     arrythmia  . Mental illness Mother     bipolar  . Heart disease Father   . Arthritis Father     rheumatoid  . Depression Sister   . Arthritis Sister   . Colon cancer Neg Hx   . Esophageal cancer Neg Hx   . Rectal cancer Neg Hx   . Stomach cancer Neg Hx     History   Social History  . Marital Status: Married    Spouse Name: N/A    Number of Children: N/A  . Years of Education: N/A   Occupational History  . Not on file.   Social History Main Topics  . Smoking status: Never Smoker   . Smokeless tobacco: Never Used  . Alcohol Use: 2.4 oz/week    4 Glasses of wine per week     Comment: 4 glasses of wine weekly  . Drug Use: No  . Sexually Active: Yes -- Female partner(s)   Other Topics Concern  . Not on file   Social History Narrative  . No narrative on file    Current Outpatient Prescriptions on File Prior to Visit  Medication Sig Dispense Refill  . ALPRAZolam (XANAX) 0.5 MG tablet Take 1 tablet (0.5 mg total) by  mouth 3 (three) times daily as needed for sleep or anxiety.  30 tablet  1  . CRESTOR 10 MG tablet take 1 tablet by mouth ON MONDAYS, WEDNESDAYS, AND FRIDAYS  30 each  6  . Krill Oil CAPS 1 krill oil caps daily, MegaRed caps by Schiff      . metoprolol tartrate (LOPRESSOR) 25 MG tablet Take 0.5 tablets (12.5 mg total) by mouth 2 (two) times daily.  30 tablet  2  . omeprazole (PRILOSEC) 40 MG capsule take 1 capsule by mouth once daily  30 capsule  3  . telmisartan-hydrochlorothiazide (MICARDIS HCT) 80-25 MG per tablet TAKE 1/2 TABLET BY MOUTH DAILY  30 tablet  2  . venlafaxine XR (EFFEXOR XR) 150 MG 24 hr capsule Take 1 capsule (150 mg total) by mouth daily.  30 capsule  2  . venlafaxine XR (EFFEXOR XR) 75 MG 24 hr capsule Take 1 capsule (75 mg total) by mouth daily.  30 capsule  2   No current facility-administered medications on file prior to visit.    Allergies  Allergen Reactions  . Bactrim (Sulfamethoxazole W-Trimethoprim) Other (See  Comments)    Oral ulcers and rash  . Penicillins Hives    Review of Systems  Review of Systems  Constitutional: Positive for malaise/fatigue. Negative for fever.  HENT: Positive for congestion and sore throat.   Eyes: Negative for discharge.  Respiratory: Positive for cough and sputum production. Negative for shortness of breath and wheezing.   Cardiovascular: Negative for chest pain, palpitations and leg swelling.  Gastrointestinal: Negative for nausea, abdominal pain and diarrhea.  Genitourinary: Positive for urgency and frequency. Negative for dysuria, hematuria and flank pain.  Musculoskeletal: Positive for myalgias. Negative for falls.  Skin: Negative for rash.  Neurological: Negative for loss of consciousness and headaches.  Endo/Heme/Allergies: Negative for polydipsia.  Psychiatric/Behavioral: Negative for depression and suicidal ideas. The patient is not nervous/anxious and does not have insomnia.     Objective  BP 98/70  Pulse 66  Temp(Src) 98.3 F (36.8 C) (Oral)  Ht 5\' 5"  (1.651 m)  Wt 232 lb 1.9 oz (105.289 kg)  BMI 38.63 kg/m2  SpO2 94%  Physical Exam Physical Exam  Constitutional: She is oriented to person, place, and time and well-developed, well-nourished, and in no distress. No distress.  HENT:  Head: Normocephalic and atraumatic.  Mild erythema and edema in oropharynx  Eyes: Conjunctivae are normal.  Neck: Neck supple. No thyromegaly present.  Cardiovascular: Normal rate and regular rhythm.  Exam reveals no gallop.   No murmur heard. Pulmonary/Chest: Effort normal and breath sounds normal. She has no wheezes.  Abdominal: She exhibits no distension and no mass.  Musculoskeletal: She exhibits no edema.  Lymphadenopathy:    She has no cervical adenopathy.  Neurological: She is alert and oriented to person, place, and time.  Skin: Skin is warm and dry. No rash noted. She is not diaphoretic.  Psychiatric: Memory, affect and judgment normal.    Lab  Results  Component Value Date   TSH 0.74 02/15/2012   Lab Results  Component Value Date   WBC 6.4 02/15/2012   HGB 12.8 02/15/2012   HCT 38.2 02/15/2012   MCV 88.1 02/15/2012   PLT 232.0 02/15/2012   Lab Results  Component Value Date   CREATININE 0.8 02/15/2012   BUN 17 02/15/2012   NA 143 02/15/2012   K 4.5 02/15/2012   CL 106 02/15/2012   CO2 30 02/15/2012   Lab Results  Component  Value Date   ALT 21 02/15/2012   AST 21 02/15/2012   ALKPHOS 66 02/15/2012   BILITOT 0.7 02/15/2012   Lab Results  Component Value Date   CHOL 177 02/15/2012   Lab Results  Component Value Date   HDL 54.10 02/15/2012   Lab Results  Component Value Date   LDLCALC 92 02/15/2012   Lab Results  Component Value Date   TRIG 155.0* 02/15/2012   Lab Results  Component Value Date   CHOLHDL 3 02/15/2012     Assessment & Plan  HYPERTENSION Well controlled no changes  Urinary frequency Urinalysis and culture and sensitivity are checked. Started on ciprofloxacin twice a day. Start a probiotic, increase hydration.  Acute pharyngitis 3 weeks and worsening. Encouraged increased hydration and rest. Start a probiotics and think. Ciprofloxacin

## 2013-02-25 NOTE — Assessment & Plan Note (Signed)
Well controlled no changes 

## 2013-02-25 NOTE — Patient Instructions (Addendum)
Probiotic such as Digestive Advantage by Schiff   Urinary Tract Infection Urinary tract infections (UTIs) can develop anywhere along your urinary tract. Your urinary tract is your body's drainage system for removing wastes and extra water. Your urinary tract includes two kidneys, two ureters, a bladder, and a urethra. Your kidneys are a pair of bean-shaped organs. Each kidney is about the size of your fist. They are located below your ribs, one on each side of your spine. CAUSES Infections are caused by microbes, which are microscopic organisms, including fungi, viruses, and bacteria. These organisms are so small that they can only be seen through a microscope. Bacteria are the microbes that most commonly cause UTIs. SYMPTOMS  Symptoms of UTIs may vary by age and gender of the patient and by the location of the infection. Symptoms in young women typically include a frequent and intense urge to urinate and a painful, burning feeling in the bladder or urethra during urination. Older women and men are more likely to be tired, shaky, and weak and have muscle aches and abdominal pain. A fever may mean the infection is in your kidneys. Other symptoms of a kidney infection include pain in your back or sides below the ribs, nausea, and vomiting. DIAGNOSIS To diagnose a UTI, your caregiver will ask you about your symptoms. Your caregiver also will ask to provide a urine sample. The urine sample will be tested for bacteria and white blood cells. White blood cells are made by your body to help fight infection. TREATMENT  Typically, UTIs can be treated with medication. Because most UTIs are caused by a bacterial infection, they usually can be treated with the use of antibiotics. The choice of antibiotic and length of treatment depend on your symptoms and the type of bacteria causing your infection. HOME CARE INSTRUCTIONS  If you were prescribed antibiotics, take them exactly as your caregiver instructs you.  Finish the medication even if you feel better after you have only taken some of the medication.  Drink enough water and fluids to keep your urine clear or pale yellow.  Avoid caffeine, tea, and carbonated beverages. They tend to irritate your bladder.  Empty your bladder often. Avoid holding urine for long periods of time.  Empty your bladder before and after sexual intercourse.  After a bowel movement, women should cleanse from front to back. Use each tissue only once. SEEK MEDICAL CARE IF:   You have back pain.  You develop a fever.  Your symptoms do not begin to resolve within 3 days. SEEK IMMEDIATE MEDICAL CARE IF:   You have severe back pain or lower abdominal pain.  You develop chills.  You have nausea or vomiting.  You have continued burning or discomfort with urination. MAKE SURE YOU:   Understand these instructions.  Will watch your condition.  Will get help right away if you are not doing well or get worse. Document Released: 05/31/2005 Document Revised: 02/20/2012 Document Reviewed: 09/29/2011 [facility] Patient Information 2014 [place], [place].

## 2013-02-25 NOTE — Assessment & Plan Note (Signed)
3 weeks and worsening. Encouraged increased hydration and rest. Start a probiotics and think. Ciprofloxacin

## 2013-02-25 NOTE — Assessment & Plan Note (Signed)
Urinalysis and culture and sensitivity are checked. Started on ciprofloxacin twice a day. Start a probiotic, increase hydration.

## 2013-02-26 LAB — URINALYSIS
Bilirubin Urine: NEGATIVE
Glucose, UA: NEGATIVE mg/dL
Ketones, ur: NEGATIVE mg/dL
Nitrite: NEGATIVE
Protein, ur: NEGATIVE mg/dL
Specific Gravity, Urine: 1.017 (ref 1.005–1.030)
Urobilinogen, UA: 0.2 mg/dL (ref 0.0–1.0)
pH: 5.5 (ref 5.0–8.0)

## 2013-02-27 LAB — URINE CULTURE: Colony Count: 4000

## 2013-04-01 ENCOUNTER — Telehealth: Payer: Self-pay

## 2013-04-01 NOTE — Telephone Encounter (Signed)
PA for Omeprazole sent to [facility]

## 2013-04-02 ENCOUNTER — Other Ambulatory Visit: Payer: BC Managed Care – PPO

## 2013-04-03 NOTE — Telephone Encounter (Signed)
pts Omeprazole has been approved 03-12-13 through7-30-15

## 2013-04-07 ENCOUNTER — Encounter: Payer: Self-pay | Admitting: Family Medicine

## 2013-04-07 ENCOUNTER — Ambulatory Visit (INDEPENDENT_AMBULATORY_CARE_PROVIDER_SITE_OTHER): Payer: BC Managed Care – PPO | Admitting: Family Medicine

## 2013-04-07 VITALS — BP 106/68 | HR 69 | Temp 98.0°F | Ht 65.0 in | Wt 232.0 lb

## 2013-04-07 DIAGNOSIS — E785 Hyperlipidemia, unspecified: Secondary | ICD-10-CM

## 2013-04-07 DIAGNOSIS — R059 Cough, unspecified: Secondary | ICD-10-CM

## 2013-04-07 DIAGNOSIS — R05 Cough: Secondary | ICD-10-CM

## 2013-04-07 DIAGNOSIS — F419 Anxiety disorder, unspecified: Secondary | ICD-10-CM

## 2013-04-07 DIAGNOSIS — K219 Gastro-esophageal reflux disease without esophagitis: Secondary | ICD-10-CM

## 2013-04-07 DIAGNOSIS — F32A Depression, unspecified: Secondary | ICD-10-CM

## 2013-04-07 DIAGNOSIS — E669 Obesity, unspecified: Secondary | ICD-10-CM

## 2013-04-07 DIAGNOSIS — I10 Essential (primary) hypertension: Secondary | ICD-10-CM

## 2013-04-07 DIAGNOSIS — F341 Dysthymic disorder: Secondary | ICD-10-CM

## 2013-04-07 LAB — HEPATIC FUNCTION PANEL
ALT: 19 U/L (ref 0–35)
AST: 19 U/L (ref 0–37)
Albumin: 4.6 g/dL (ref 3.5–5.2)
Alkaline Phosphatase: 84 U/L (ref 39–117)
Bilirubin, Direct: 0.1 mg/dL (ref 0.0–0.3)
Indirect Bilirubin: 0.4 mg/dL (ref 0.0–0.9)
Total Bilirubin: 0.5 mg/dL (ref 0.3–1.2)
Total Protein: 7.1 g/dL (ref 6.0–8.3)

## 2013-04-07 LAB — TSH: TSH: 1.28 u[IU]/mL (ref 0.350–4.500)

## 2013-04-07 LAB — CBC
HCT: 36.5 % (ref 36.0–46.0)
Hemoglobin: 13.1 g/dL (ref 12.0–15.0)
MCH: 31.6 pg (ref 26.0–34.0)
MCHC: 35.9 g/dL (ref 30.0–36.0)
MCV: 88.2 fL (ref 78.0–100.0)
Platelets: 300 10*3/uL (ref 150–400)
RBC: 4.14 MIL/uL (ref 3.87–5.11)
RDW: 14.8 % (ref 11.5–15.5)
WBC: 5.7 10*3/uL (ref 4.0–10.5)

## 2013-04-07 LAB — LIPID PANEL
Cholesterol: 190 mg/dL (ref 0–200)
HDL: 51 mg/dL (ref >39)
LDL Cholesterol: 109 mg/dL — ABNORMAL HIGH (ref 0–99)
Total CHOL/HDL Ratio: 3.7 Ratio
Triglycerides: 148 mg/dL (ref <150)
VLDL: 30 mg/dL (ref 0–40)

## 2013-04-07 LAB — RENAL FUNCTION PANEL
Albumin: 4.6 g/dL (ref 3.5–5.2)
BUN: 17 mg/dL (ref 6–23)
CO2: 31 mEq/L (ref 19–32)
Calcium: 9.8 mg/dL (ref 8.4–10.5)
Chloride: 106 mEq/L (ref 96–112)
Creat: 0.9 mg/dL (ref 0.50–1.10)
Glucose, Bld: 105 mg/dL — ABNORMAL HIGH (ref 70–99)
Phosphorus: 4.3 mg/dL (ref 2.3–4.6)
Potassium: 4.8 mEq/L (ref 3.5–5.3)
Sodium: 142 mEq/L (ref 135–145)

## 2013-04-07 MED ORDER — BENZONATATE 100 MG PO CAPS: 100.0000 mg | ORAL_CAPSULE | Freq: Three times a day (TID) | ORAL | Status: DC | PRN

## 2013-04-07 MED ORDER — OMEPRAZOLE 40 MG PO CPDR: 40.0000 mg | DELAYED_RELEASE_CAPSULE | Freq: Every day | ORAL | Status: DC

## 2013-04-07 MED ORDER — VENLAFAXINE HCL ER 75 MG PO CP24: 225.0000 mg | ORAL_CAPSULE | Freq: Every day | ORAL | Status: DC

## 2013-04-07 NOTE — Assessment & Plan Note (Signed)
Well controlled, no changes 

## 2013-04-07 NOTE — Patient Instructions (Addendum)
Avoid trans fats, take krill oil caps still  Rel of records, [facility], bone density test  Start probiotic, such as Digestive Advantage, start daily antihistamine such as Zyrtec, and for the possibility zinc such as Xicam, Coldeeze, also do plain Mucinex 600 mg po bid x 10 day  Add Tums 2 at bedtime   Find an eye doctor with an Opthamologist in office  Cholesterol Cholesterol is a white, waxy, fat-like protein needed by your body in small amounts. The liver makes all the cholesterol you need. It is carried from the liver by the blood through the blood vessels. Deposits (plaque) may build up on blood vessel walls. This makes the arteries narrower and stiffer. Plaque increases the risk for heart attack and stroke. You cannot feel your cholesterol level even if it is very high. The only way to know is by a blood test to check your lipid (fats) levels. Once you know your cholesterol levels, you should keep a record of the test results. Work with your caregiver to to keep your levels in the desired range. WHAT THE RESULTS MEAN:  Total cholesterol is a rough measure of all the cholesterol in your blood.  LDL is the so-called bad cholesterol. This is the type that deposits cholesterol in the walls of the arteries. You want this level to be low.  HDL is the good cholesterol because it cleans the arteries and carries the LDL away. You want this level to be high.  Triglycerides are fat that the body can either burn for energy or store. High levels are closely linked to heart disease. DESIRED LEVELS:  Total cholesterol below 200.  LDL below 100 for people at risk, below 70 for very high risk.  HDL above 50 is good, above 60 is best.  Triglycerides below 150. HOW TO LOWER YOUR CHOLESTEROL:  Diet.  Choose fish or white meat chicken and [place], roasted or baked. Limit fatty cuts of red meat, fried foods, and processed meats, such as sausage and lunch meat.  Eat lots of fresh fruits and  vegetables. Choose whole grains, beans, pasta, potatoes and cereals.  Use only small amounts of olive, corn or canola oils. Avoid butter, mayonnaise, shortening or palm kernel oils. Avoid foods with trans-fats.  Use skim/nonfat milk and low-fat/nonfat yogurt and cheeses. Avoid whole milk, cream, ice cream, egg yolks and cheeses. Healthy desserts include angel food cake, ginger snaps, animal crackers, hard candy, popsicles, and low-fat/nonfat frozen yogurt. Avoid pastries, cakes, pies and cookies.  Exercise.  A regular program helps decrease LDL and raises HDL.  Helps with weight control.  Do things that increase your activity level like gardening, walking, or taking the stairs.  Medication.  May be prescribed by your caregiver to help lowering cholesterol and the risk for heart disease.  You may need medicine even if your levels are normal if you have several risk factors. HOME CARE INSTRUCTIONS   Follow your diet and exercise programs as suggested by your caregiver.  Take medications as directed.  Have blood work done when your caregiver feels it is necessary. MAKE SURE YOU:   Understand these instructions.  Will watch your condition.  Will get help right away if you are not doing well or get worse. Document Released: 05/16/2001 Document Revised: 11/13/2011 Document Reviewed: 11/06/2007 [name] Patient Information 2014 [place], [place].

## 2013-04-07 NOTE — Assessment & Plan Note (Addendum)
Continue meds and add 2 Tums qhs, avoid offending foods.

## 2013-04-09 ENCOUNTER — Ambulatory Visit: Payer: BC Managed Care – PPO | Admitting: Family Medicine

## 2013-04-13 NOTE — Assessment & Plan Note (Signed)
Encouraged DASH diet and increased exercise as tolerated.

## 2013-04-13 NOTE — Assessment & Plan Note (Signed)
Tolerating Crestor, avoid trans fats, add krill oil

## 2013-04-13 NOTE — Progress Notes (Signed)
Patient ID: Kristin [name], female   DOB: 1951-02-16, 62 y.o.   MRN: [id number] SCOTT FIX [id number] 04-Nov-1950 04/13/2013      Progress Note-Follow Up  Subjective  Chief Complaint  Chief Complaint  Patient presents with  . Annual Exam    physical    HPI  Patient is a 62 year old Caucasian today for followup. She continues to struggle with some heartburn although it is improved. No recent illness. Denies chest pain, palpitations, shortness of breath, GU complaints. He is taking medications as prescribed and is frustrated with persistent weight concerns.  Past Medical History  Diagnosis Date  . Fibroids   . Abnormal vaginal Pap smear   . Anemia   . GERD (gastroesophageal reflux disease)   . Hyperlipidemia   . Hypertension   . Arthritis   . Anxiety   . Obesity   . Hiatal hernia   . Esophageal stricture 1994  . Chronic UTI     sees dr Vonita Moss  . Measles as a child  . Chicken pox as a child  . 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 Depression with anxiety 11/02/2009    Qualifier: Diagnosis of  By: Mayford Knife, LPN, Domenic Polite   . Plantar fasciitis of left foot 06/19/2012  . Dermatitis 07/17/2012  . Urinary frequency 02/25/2013  . Acute pharyngitis 02/25/2013    Past Surgical History  Procedure Laterality Date  . Wisdom teeth extracted    . Laporoscopy    . Tonsillectomy    . Abdominal hysterectomy  2006    total  . Partial hip arthroplasty  2010    right  . Esophageal      stretching    Family History  Problem Relation Age of Onset  . Cancer Mother     breast  . Other Mother     arrythmia  . Mental illness Mother     bipolar  . Heart disease Father   . Arthritis Father     rheumatoid  . Depression Sister   . Arthritis Sister   . Colon cancer Neg Hx   . Esophageal cancer Neg Hx   . Rectal cancer Neg Hx   . Stomach cancer Neg Hx     History   Social History  . Marital Status: Married    Spouse Name: N/A    Number of Children: N/A  . Years of  Education: N/A   Occupational History  . Not on file.   Social History Main Topics  . Smoking status: Never Smoker   . Smokeless tobacco: Never Used  . Alcohol Use: 2.4 oz/week    4 Glasses of wine per week     Comment: 4 glasses of wine weekly  . Drug Use: No  . Sexually Active: Yes -- Female partner(s)   Other Topics Concern  . Not on file   Social History Narrative  . No narrative on file    Current Outpatient Prescriptions on File Prior to Visit  Medication Sig Dispense Refill  . ALPRAZolam (XANAX) 0.5 MG tablet Take 1 tablet (0.5 mg total) by mouth 3 (three) times daily as needed for sleep or anxiety.  30 tablet  1  . CRESTOR 10 MG tablet take 1 tablet by mouth ON MONDAYS, WEDNESDAYS, AND FRIDAYS  30 each  6  . Krill Oil CAPS 1 krill oil caps daily, MegaRed caps by Schiff      . metoprolol tartrate (LOPRESSOR) 25 MG tablet Take 0.5 tablets (12.5 mg total) by mouth 2 (two)  times daily.  30 tablet  2  . telmisartan-hydrochlorothiazide (MICARDIS HCT) 80-25 MG per tablet TAKE 1/2 TABLET BY MOUTH DAILY  30 tablet  2   No current facility-administered medications on file prior to visit.    Allergies  Allergen Reactions  . Bactrim (Sulfamethoxazole W-Trimethoprim) Other (See Comments)    Oral ulcers and rash  . Penicillins Hives    Review of Systems  Review of Systems  Constitutional: Negative for fever and malaise/fatigue.  HENT: Negative for congestion.   Eyes: Negative for pain and discharge.  Respiratory: Negative for shortness of breath.   Cardiovascular: Negative for chest pain, palpitations and leg swelling.  Gastrointestinal: Negative for nausea, abdominal pain and diarrhea.  Genitourinary: Negative for dysuria.  Musculoskeletal: Negative for falls.  Skin: Negative for rash.  Neurological: Negative for loss of consciousness and headaches.  Endo/Heme/Allergies: Negative for polydipsia.  Psychiatric/Behavioral: Negative for depression and suicidal ideas. The  patient is not nervous/anxious and does not have insomnia.     Objective  BP 106/68  Pulse 69  Temp(Src) 98 F (36.7 C) (Oral)  Ht 5\' 5"  (1.651 m)  Wt 232 lb (105.235 kg)  BMI 38.61 kg/m2  SpO2 97%  Physical Exam  Physical Exam  Constitutional: She is oriented to person, place, and time and well-developed, well-nourished, and in no distress. No distress.  HENT:  Head: Normocephalic and atraumatic.  Eyes: Conjunctivae are normal.  Neck: Neck supple. No thyromegaly present.  Cardiovascular: Normal rate, regular rhythm and normal heart sounds.  Exam reveals no gallop.   No murmur heard. Pulmonary/Chest: Effort normal and breath sounds normal. She has no wheezes.  Abdominal: She exhibits no distension and no mass.  Musculoskeletal: She exhibits no edema.  Lymphadenopathy:    She has no cervical adenopathy.  Neurological: She is alert and oriented to person, place, and time.  Skin: Skin is warm and dry. No rash noted. She is not diaphoretic.  Psychiatric: Memory, affect and judgment normal.    Lab Results  Component Value Date   TSH 1.280 04/07/2013   Lab Results  Component Value Date   WBC 5.7 04/07/2013   HGB 13.1 04/07/2013   HCT 36.5 04/07/2013   MCV 88.2 04/07/2013   PLT 300 04/07/2013   Lab Results  Component Value Date   CREATININE 0.90 04/07/2013   BUN 17 04/07/2013   NA 142 04/07/2013   K 4.8 04/07/2013   CL 106 04/07/2013   CO2 31 04/07/2013   Lab Results  Component Value Date   ALT 19 04/07/2013   AST 19 04/07/2013   ALKPHOS 84 04/07/2013   BILITOT 0.5 04/07/2013   Lab Results  Component Value Date   CHOL 190 04/07/2013   Lab Results  Component Value Date   HDL 51 04/07/2013   Lab Results  Component Value Date   LDLCALC 109* 04/07/2013   Lab Results  Component Value Date   TRIG 148 04/07/2013   Lab Results  Component Value Date   CHOLHDL 3.7 04/07/2013     Assessment & Plan  HYPERTENSION Well controlled, no changes  GERD Continue meds and add 2 Tums qhs, avoid  offending foods.  OBESITY, UNSPECIFIED Encouraged DASH diet and increased exercise as tolerated.   HYPERLIPIDEMIA Tolerating Crestor, avoid trans fats, add krill oil

## 2013-07-10 ENCOUNTER — Other Ambulatory Visit: Payer: Self-pay

## 2013-07-10 ENCOUNTER — Ambulatory Visit (INDEPENDENT_AMBULATORY_CARE_PROVIDER_SITE_OTHER): Payer: BC Managed Care – PPO | Admitting: Family Medicine

## 2013-07-10 ENCOUNTER — Encounter: Payer: Self-pay | Admitting: Family Medicine

## 2013-07-10 VITALS — BP 108/76 | HR 74 | Temp 98.5°F | Ht 65.0 in | Wt 227.0 lb

## 2013-07-10 DIAGNOSIS — F329 Major depressive disorder, single episode, unspecified: Secondary | ICD-10-CM

## 2013-07-10 DIAGNOSIS — Z23 Encounter for immunization: Secondary | ICD-10-CM

## 2013-07-10 DIAGNOSIS — F418 Other specified anxiety disorders: Secondary | ICD-10-CM

## 2013-07-10 DIAGNOSIS — I10 Essential (primary) hypertension: Secondary | ICD-10-CM

## 2013-07-10 DIAGNOSIS — E785 Hyperlipidemia, unspecified: Secondary | ICD-10-CM

## 2013-07-10 DIAGNOSIS — R251 Tremor, unspecified: Secondary | ICD-10-CM

## 2013-07-10 DIAGNOSIS — F411 Generalized anxiety disorder: Secondary | ICD-10-CM

## 2013-07-10 DIAGNOSIS — F341 Dysthymic disorder: Secondary | ICD-10-CM

## 2013-07-10 DIAGNOSIS — F419 Anxiety disorder, unspecified: Secondary | ICD-10-CM

## 2013-07-10 DIAGNOSIS — F32A Depression, unspecified: Secondary | ICD-10-CM

## 2013-07-10 DIAGNOSIS — R259 Unspecified abnormal involuntary movements: Secondary | ICD-10-CM

## 2013-07-10 MED ORDER — TELMISARTAN-HCTZ 80-25 MG PO TABS: ORAL_TABLET | ORAL | Status: DC

## 2013-07-10 MED ORDER — ALPRAZOLAM 0.5 MG PO TABS: 0.5000 mg | ORAL_TABLET | Freq: Three times a day (TID) | ORAL | Status: DC | PRN

## 2013-07-10 MED ORDER — METOPROLOL SUCCINATE ER 25 MG PO TB24: 25.0000 mg | ORAL_TABLET | Freq: Every day | ORAL | Status: DC

## 2013-07-10 MED ORDER — DESVENLAFAXINE SUCCINATE ER 50 MG PO TB24: 50.0000 mg | ORAL_TABLET | Freq: Every day | ORAL | Status: DC

## 2013-07-10 NOTE — Patient Instructions (Signed)

## 2013-07-10 NOTE — Progress Notes (Signed)
Patient ID: Kristin [name], female   DOB: 08-Dec-1950, 62 y.o.   MRN: [id number] Kristin [name] [id number] Jan 08, 1951 07/10/2013      Progress Note-Follow Up  Subjective  Chief Complaint  Chief Complaint  Patient presents with  . Follow-up    3 month  . Injections    prevnar and flu    HPI  Physical Exam  Constitutional: She is oriented to person, place, and time and well-developed, well-nourished, and in no distress. No distress.  HENT:  Head: Normocephalic and atraumatic.  Eyes: Conjunctivae are normal.  Neck: Neck supple. No thyromegaly present.  Cardiovascular: Normal rate, regular rhythm and normal heart sounds.   No murmur heard. Pulmonary/Chest: Effort normal and breath sounds normal. She has no wheezes.  Abdominal: Soft. Bowel sounds are normal. She exhibits no distension and no mass.  Musculoskeletal: She exhibits no edema.  Lymphadenopathy:    She has no cervical adenopathy.  Neurological: She is alert and oriented to person, place, and time.  Skin: Skin is warm and dry. No rash noted. She is not diaphoretic.  Psychiatric: Memory, affect and judgment normal.    Past Medical History  Diagnosis Date  . Fibroids   . Abnormal vaginal Pap smear   . Anemia   . GERD (gastroesophageal reflux disease)   . Hyperlipidemia   . Hypertension   . Arthritis   . Anxiety   . Obesity   . Hiatal hernia   . Esophageal stricture 1994  . Chronic UTI     sees dr Vonita Moss  . Measles as a child  . Chicken pox as a child  . 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 Depression with anxiety 11/02/2009    Qualifier: Diagnosis of  By: Mayford Knife, LPN, Domenic Polite   . Plantar fasciitis of left foot 06/19/2012  . Dermatitis 07/17/2012  . Urinary frequency 02/25/2013  . Acute pharyngitis 02/25/2013    Past Surgical History  Procedure Laterality Date  . Wisdom teeth extracted    . Laporoscopy    . Tonsillectomy    . Abdominal hysterectomy  2006    total  . Partial hip arthroplasty  2010     right  . Esophageal      stretching    Family History  Problem Relation Age of Onset  . Cancer Mother     breast  . Other Mother     arrythmia  . Mental illness Mother     bipolar  . Heart disease Father   . Arthritis Father     rheumatoid  . Depression Sister   . Arthritis Sister   . Colon cancer Neg Hx   . Esophageal cancer Neg Hx   . Rectal cancer Neg Hx   . Stomach cancer Neg Hx     History   Social History  . Marital Status: Married    Spouse Name: N/A    Number of Children: N/A  . Years of Education: N/A   Occupational History  . Not on file.   Social History Main Topics  . Smoking status: Never Smoker   . Smokeless tobacco: Never Used  . Alcohol Use: 2.4 oz/week    4 Glasses of wine per week     Comment: 4 glasses of wine weekly  . Drug Use: No  . Sexual Activity: Yes    Partners: Male   Other Topics Concern  . Not on file   Social History Narrative  . No narrative on file    Current Outpatient  Prescriptions on File Prior to Visit  Medication Sig Dispense Refill  . ALPRAZolam (XANAX) 0.5 MG tablet Take 1 tablet (0.5 mg total) by mouth 3 (three) times daily as needed for sleep or anxiety.  30 tablet  1  . Krill Oil CAPS 1 krill oil caps daily, MegaRed caps by Schiff      . CRESTOR 10 MG tablet take 1 tablet by mouth ON MONDAYS, WEDNESDAYS, AND FRIDAYS  30 each  6  . omeprazole (PRILOSEC) 40 MG capsule Take 1 capsule (40 mg total) by mouth daily.  30 capsule  11  . telmisartan-hydrochlorothiazide (MICARDIS HCT) 80-25 MG per tablet TAKE 1/2 TABLET BY MOUTH DAILY  30 tablet  2  . venlafaxine XR (EFFEXOR XR) 75 MG 24 hr capsule Take 3 capsules (225 mg total) by mouth daily.  90 capsule  2   No current facility-administered medications on file prior to visit.    Allergies  Allergen Reactions  . Bactrim [Sulfamethoxazole-Trimethoprim] Other (See Comments)    Oral ulcers and rash  . Penicillins Hives    Review of Systems  Review of Systems   Constitutional: Negative for fever and malaise/fatigue.  HENT: Negative for congestion.   Eyes: Negative for discharge.  Respiratory: Negative for shortness of breath.   Cardiovascular: Negative for chest pain, palpitations and leg swelling.  Gastrointestinal: Negative for nausea, abdominal pain and diarrhea.  Genitourinary: Negative for dysuria.  Musculoskeletal: Negative for falls.  Skin: Negative for rash.  Neurological: Negative for loss of consciousness and headaches.  Endo/Heme/Allergies: Negative for polydipsia.  Psychiatric/Behavioral: Negative for depression and suicidal ideas. The patient is not nervous/anxious and does not have insomnia.     Objective  BP 108/76  Pulse 74  Temp(Src) 98.5 F (36.9 C) (Oral)  Ht 5\' 5"  (1.651 m)  Wt 227 lb 0.6 oz (102.985 kg)  BMI 37.78 kg/m2  SpO2 92%  Physical Exam  Patient is a 62 yo female in today for follow up. She is still struggling with anxiety and high stress at her job. No suicidal ideation but struggling with anhedonia. No cp/palp/sob/gi or gu c/o. Her tremors are persistent but not worse, she often misses her second dose or Metoprolol Tartrate and then it worsens.   Lab Results  Component Value Date   TSH 1.280 04/07/2013   Lab Results  Component Value Date   WBC 5.7 04/07/2013   HGB 13.1 04/07/2013   HCT 36.5 04/07/2013   MCV 88.2 04/07/2013   PLT 300 04/07/2013   Lab Results  Component Value Date   CREATININE 0.90 04/07/2013   BUN 17 04/07/2013   NA 142 04/07/2013   K 4.8 04/07/2013   CL 106 04/07/2013   CO2 31 04/07/2013   Lab Results  Component Value Date   ALT 19 04/07/2013   AST 19 04/07/2013   ALKPHOS 84 04/07/2013   BILITOT 0.5 04/07/2013   Lab Results  Component Value Date   CHOL 190 04/07/2013   Lab Results  Component Value Date   HDL 51 04/07/2013   Lab Results  Component Value Date   LDLCALC 109* 04/07/2013   Lab Results  Component Value Date   TRIG 148 04/07/2013   Lab Results  Component Value Date   CHOLHDL 3.7  04/07/2013     Assessment & Plan  HYPERTENSION Well controlled no changes  HYPERLIPIDEMIA Well controlled no changes.  Tremor Stable  Depression with anxiety Is considering retirement, stress is too high, switch from Effexor to [employer]

## 2013-07-13 ENCOUNTER — Encounter: Payer: Self-pay | Admitting: Family Medicine

## 2013-07-13 NOTE — Assessment & Plan Note (Signed)
Is considering retirement, stress is too high, switch from Effexor to [employer]

## 2013-07-13 NOTE — Assessment & Plan Note (Signed)
Well controlled no changes 

## 2013-07-13 NOTE — Assessment & Plan Note (Signed)
Stable

## 2013-07-14 ENCOUNTER — Other Ambulatory Visit: Payer: Self-pay | Admitting: Family Medicine

## 2013-07-14 NOTE — Telephone Encounter (Signed)
Effexor request denied as pt was changed to Pristiq per last office note of 07/10/13.

## 2013-08-06 ENCOUNTER — Other Ambulatory Visit: Payer: Self-pay | Admitting: Family Medicine

## 2013-08-06 NOTE — Telephone Encounter (Signed)
Effexor request denied as pt was changed to Pristiq per last office note of 07/10/13. 

## 2013-08-18 ENCOUNTER — Ambulatory Visit: Payer: BC Managed Care – PPO | Admitting: Family Medicine

## 2013-08-19 ENCOUNTER — Encounter: Payer: Self-pay | Admitting: Family Medicine

## 2013-08-19 ENCOUNTER — Ambulatory Visit (INDEPENDENT_AMBULATORY_CARE_PROVIDER_SITE_OTHER): Payer: BC Managed Care – PPO | Admitting: Family Medicine

## 2013-08-19 VITALS — BP 122/78 | HR 73 | Temp 98.5°F | Ht 65.0 in | Wt 230.1 lb

## 2013-08-19 DIAGNOSIS — I10 Essential (primary) hypertension: Secondary | ICD-10-CM

## 2013-08-19 DIAGNOSIS — F418 Other specified anxiety disorders: Secondary | ICD-10-CM

## 2013-08-19 DIAGNOSIS — R739 Hyperglycemia, unspecified: Secondary | ICD-10-CM | POA: Insufficient documentation

## 2013-08-19 DIAGNOSIS — R259 Unspecified abnormal involuntary movements: Secondary | ICD-10-CM

## 2013-08-19 DIAGNOSIS — R251 Tremor, unspecified: Secondary | ICD-10-CM

## 2013-08-19 DIAGNOSIS — F341 Dysthymic disorder: Secondary | ICD-10-CM

## 2013-08-19 DIAGNOSIS — R7309 Other abnormal glucose: Secondary | ICD-10-CM

## 2013-08-19 HISTORY — DX: Hyperglycemia, unspecified: R73.9

## 2013-08-19 MED ORDER — DULOXETINE HCL 60 MG PO CPEP: 60.0000 mg | ORAL_CAPSULE | Freq: Every day | ORAL | Status: DC

## 2013-08-19 MED ORDER — ESCITALOPRAM OXALATE 10 MG PO TABS: 10.0000 mg | ORAL_TABLET | Freq: Every day | ORAL | Status: DC

## 2013-08-19 NOTE — Assessment & Plan Note (Signed)
Mild, will repeat renal panel with hgba1c at next visit

## 2013-08-19 NOTE — Assessment & Plan Note (Signed)
Worse on days when she is stressed but present always, she is hoping to retire soon and then is interested in working this up and trying to manage it more

## 2013-08-19 NOTE — Assessment & Plan Note (Addendum)
Nightmares on Pristiq, patient switched herself back to Cymbalta at 120 mg daily. Will drop her to 60 mg daily and try adding Lexapro 10 mg daily reassess next month or as needed

## 2013-08-19 NOTE — Patient Instructions (Signed)

## 2013-08-19 NOTE — Progress Notes (Signed)
Patient ID: Kristin [name], female   DOB: 17-Jul-1951, 62 y.o.   MRN: [id number] PAMLEA FINDER [id number] 1951-07-13 08/19/2013      Progress Note-Follow Up  Subjective  Chief Complaint  Chief Complaint  Patient presents with  . Follow-up    HPI  Patient is a 62 year old Caucasian female is here today in followup. At her last visit she felt as if her Cymbalta has not been working well enough so we switched her to [place]. She said she felt fine during the day and had nightmares at night so she stopped it herself and put herself back on Cymbalta at 120 mg about a week ago. Feels okay today but acknowledges ongoing stress at work and is getting ready to try and retired. Notes that her shaking and tremors are present all the time but worse on stressful days. No other acute illness or concerns. No chest pain, palpitations or shortness of breath. No GI or GU concerns at this time.  Past Medical History  Diagnosis Date  . Fibroids   . Abnormal vaginal Pap smear   . Anemia   . GERD (gastroesophageal reflux disease)   . Hyperlipidemia   . Hypertension   . Arthritis   . Anxiety   . Obesity   . Hiatal hernia   . Esophageal stricture 1994  . Chronic UTI     sees dr Vonita Moss  . Measles as a child  . Chicken pox as a child  . 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 Depression with anxiety 11/02/2009    Qualifier: Diagnosis of  By: Mayford Knife, LPN, Domenic Polite   . Plantar fasciitis of left foot 06/19/2012  . Dermatitis 07/17/2012  . Urinary frequency 02/25/2013  . Acute pharyngitis 02/25/2013    Past Surgical History  Procedure Laterality Date  . Wisdom teeth extracted    . Laporoscopy    . Tonsillectomy    . Abdominal hysterectomy  2006    total  . Partial hip arthroplasty  2010    right  . Esophageal      stretching    Family History  Problem Relation Age of Onset  . Cancer Mother     breast  . Other Mother     arrythmia  . Mental illness Mother     bipolar  . Heart disease  Father   . Arthritis Father     rheumatoid  . Depression Sister   . Arthritis Sister   . Colon cancer Neg Hx   . Esophageal cancer Neg Hx   . Rectal cancer Neg Hx   . Stomach cancer Neg Hx     History   Social History  . Marital Status: Married    Spouse Name: N/A    Number of Children: N/A  . Years of Education: N/A   Occupational History  . Not on file.   Social History Main Topics  . Smoking status: Never Smoker   . Smokeless tobacco: Never Used  . Alcohol Use: 2.4 oz/week    4 Glasses of wine per week     Comment: 4 glasses of wine weekly  . Drug Use: No  . Sexual Activity: Yes    Partners: Male   Other Topics Concern  . Not on file   Social History Narrative  . No narrative on file    Current Outpatient Prescriptions on File Prior to Visit  Medication Sig Dispense Refill  . ALPRAZolam (XANAX) 0.5 MG tablet Take 1 tablet (0.5 mg total) by mouth  3 (three) times daily as needed for sleep or anxiety.  40 tablet  2  . CRESTOR 10 MG tablet take 1 tablet by mouth ON MONDAYS, WEDNESDAYS, AND FRIDAYS  30 each  6  . Krill Oil CAPS 1 krill oil caps daily, MegaRed caps by Schiff      . metoprolol succinate (TOPROL XL) 25 MG 24 hr tablet Take 1 tablet (25 mg total) by mouth daily. Failed the Tartrate did not last long enough  30 tablet  3  . omeprazole (PRILOSEC) 40 MG capsule Take 1 capsule (40 mg total) by mouth daily.  30 capsule  11  . telmisartan-hydrochlorothiazide (MICARDIS HCT) 80-25 MG per tablet 1/2 tab po daily  30 tablet  2   No current facility-administered medications on file prior to visit.    Allergies  Allergen Reactions  . Bactrim [Sulfamethoxazole-Trimethoprim] Other (See Comments)    Oral ulcers and rash  . Penicillins Hives    Review of Systems  Review of Systems  Constitutional: Negative for fever and malaise/fatigue.  HENT: Negative for congestion.   Eyes: Negative for discharge.  Respiratory: Negative for shortness of breath.    Cardiovascular: Negative for chest pain, palpitations and leg swelling.  Gastrointestinal: Negative for nausea, abdominal pain and diarrhea.  Genitourinary: Negative for dysuria.  Musculoskeletal: Negative for falls.  Skin: Negative for rash.  Neurological: Negative for loss of consciousness and headaches.  Endo/Heme/Allergies: Negative for polydipsia.  Psychiatric/Behavioral: Negative for depression and suicidal ideas. The patient is not nervous/anxious and does not have insomnia.     Objective  BP 122/78  Pulse 73  Temp(Src) 98.5 F (36.9 C) (Oral)  Ht 5\' 5"  (1.651 m)  Wt 230 lb 1.3 oz (104.364 kg)  BMI 38.29 kg/m2  SpO2 96%  Physical Exam  Physical Exam  Constitutional: She is oriented to person, place, and time and well-developed, well-nourished, and in no distress. No distress.  HENT:  Head: Normocephalic and atraumatic.  Eyes: Conjunctivae are normal.  Neck: Neck supple. No thyromegaly present.  Cardiovascular: Normal rate, regular rhythm and normal heart sounds.   No murmur heard. Pulmonary/Chest: Effort normal and breath sounds normal. She has no wheezes.  Abdominal: She exhibits no distension and no mass.  Musculoskeletal: She exhibits no edema.  Lymphadenopathy:    She has no cervical adenopathy.  Neurological: She is alert and oriented to person, place, and time.  Skin: Skin is warm and dry. No rash noted. She is not diaphoretic.  Psychiatric: Memory, affect and judgment normal.    Lab Results  Component Value Date   TSH 1.280 04/07/2013   Lab Results  Component Value Date   WBC 5.7 04/07/2013   HGB 13.1 04/07/2013   HCT 36.5 04/07/2013   MCV 88.2 04/07/2013   PLT 300 04/07/2013   Lab Results  Component Value Date   CREATININE 0.90 04/07/2013   BUN 17 04/07/2013   NA 142 04/07/2013   K 4.8 04/07/2013   CL 106 04/07/2013   CO2 31 04/07/2013   Lab Results  Component Value Date   ALT 19 04/07/2013   AST 19 04/07/2013   ALKPHOS 84 04/07/2013   BILITOT 0.5 04/07/2013    Lab Results  Component Value Date   CHOL 190 04/07/2013   Lab Results  Component Value Date   HDL 51 04/07/2013   Lab Results  Component Value Date   LDLCALC 109* 04/07/2013   Lab Results  Component Value Date   TRIG 148 04/07/2013  Lab Results  Component Value Date   CHOLHDL 3.7 04/07/2013     Assessment & Plan  HYPERTENSION Well controlled, no change  Depression with anxiety Nightmares on Pristiq, patient switched herself back to Cymbalta at 120 mg daily. Will drop her to 60 mg daily and try adding Lexapro 10 mg daily reassess next month or as needed  Hyperglycemia Mild, will repeat renal panel with hgba1c at next visit  Tremor Worse on days when she is stressed but present always, she is hoping to retire soon and then is interested in working this up and trying to manage it more

## 2013-08-19 NOTE — Assessment & Plan Note (Signed)
Well-controlled, no change 

## 2013-08-19 NOTE — Progress Notes (Signed)
Pre visit review using our clinic review tool, if applicable. No additional management support is needed unless otherwise documented below in the visit note. 

## 2013-10-03 ENCOUNTER — Ambulatory Visit (INDEPENDENT_AMBULATORY_CARE_PROVIDER_SITE_OTHER): Payer: BC Managed Care – PPO | Admitting: Physician Assistant

## 2013-10-03 ENCOUNTER — Encounter: Payer: Self-pay | Admitting: Physician Assistant

## 2013-10-03 VITALS — BP 110/72 | HR 73 | Temp 98.3°F | Resp 16 | Ht 65.0 in | Wt 226.0 lb

## 2013-10-03 DIAGNOSIS — J069 Acute upper respiratory infection, unspecified: Secondary | ICD-10-CM

## 2013-10-03 DIAGNOSIS — R05 Cough: Secondary | ICD-10-CM

## 2013-10-03 DIAGNOSIS — R5381 Other malaise: Secondary | ICD-10-CM

## 2013-10-03 DIAGNOSIS — B9789 Other viral agents as the cause of diseases classified elsewhere: Principal | ICD-10-CM

## 2013-10-03 DIAGNOSIS — R059 Cough, unspecified: Secondary | ICD-10-CM

## 2013-10-03 DIAGNOSIS — R5383 Other fatigue: Secondary | ICD-10-CM

## 2013-10-03 LAB — POCT INFLUENZA A/B
Influenza A, POC: NEGATIVE
Influenza B, POC: NEGATIVE

## 2013-10-03 NOTE — Assessment & Plan Note (Signed)
Flu swab negative.  Increase fluid intake. Rest. Saline nasal spray.  Multivitamin and/or zinc supplement.  Delsym for cough.  Mucinex if needed.  Tylenol for sore throat or aches.  Place a humidifier in bedroom.  Call or return to clinic if symptoms are not improving.

## 2013-10-03 NOTE — Progress Notes (Signed)
Patient presents to clinic today c/o 1 day of cough, scratchy throat and malaise.  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  Patient denies known exposure to the flu. Thinks she might have had a fever yesterday evening.  Denies cough or congestion.  Denies recent travel.    Past Medical History  Diagnosis Date  . Fibroids   . Abnormal vaginal Pap smear   . Anemia   . GERD (gastroesophageal reflux disease)   . Hyperlipidemia   . Hypertension   . Arthritis   . Anxiety   . Obesity   . Hiatal hernia   . Esophageal stricture 1994  . Chronic UTI     sees dr Terance Hart  . Measles as a child  . Chicken pox as a child  . 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 Depression with anxiety 11/02/2009    Qualifier: Diagnosis of  By: [name], LPN, [name]   . Plantar fasciitis of left foot 06/19/2012  . Dermatitis 07/17/2012  . Urinary frequency 02/25/2013  . Acute pharyngitis 02/25/2013  . Hyperglycemia 08/19/2013    Current Outpatient Prescriptions on File Prior to Visit  Medication Sig Dispense Refill  . ALPRAZolam (XANAX) 0.5 MG tablet Take 1 tablet (0.5 mg total) by mouth 3 (three) times daily as needed for sleep or anxiety.  40 tablet  2  . CRESTOR 10 MG tablet take 1 tablet by mouth ON MONDAYS, WEDNESDAYS, AND FRIDAYS  30 each  6  . DULoxetine (CYMBALTA) 60 MG capsule Take 1 capsule (60 mg total) by mouth daily. In am  90 capsule  1  . escitalopram (LEXAPRO) 10 MG tablet Take 1 tablet (10 mg total) by mouth daily. In pm  90 tablet  1  . Krill Oil CAPS 1 krill oil caps daily, MegaRed caps by Schiff      . metoprolol succinate (TOPROL XL) 25 MG 24 hr tablet Take 1 tablet (25 mg total) by mouth daily. Failed the Tartrate did not last long enough  30 tablet  3  . omeprazole (PRILOSEC) 40 MG capsule Take 1 capsule (40 mg total) by mouth daily.  30 capsule  11  . telmisartan-hydrochlorothiazide (MICARDIS HCT) 80-25 MG per tablet 1/2 tab po daily  30 tablet  2   No  current facility-administered medications on file prior to visit.    Allergies  Allergen Reactions  . Bactrim [Sulfamethoxazole-Trimethoprim] Other (See Comments)    Oral ulcers and rash  . Penicillins Hives    Family History  Problem Relation Age of Onset  . Cancer Mother     breast  . Other Mother     arrythmia  . Mental illness Mother     bipolar  . Heart disease Father   . Arthritis Father     rheumatoid  . Depression Sister   . Arthritis Sister   . Colon cancer Neg Hx   . Esophageal cancer Neg Hx   . Rectal cancer Neg Hx   . Stomach cancer Neg Hx     History   Social History  . Marital Status: Married    Spouse Name: N/A    Number of Children: N/A  . Years of Education: N/A   Social History Main Topics  . Smoking status: Never Smoker   . Smokeless tobacco: Never Used  . Alcohol Use: 2.4 oz/week    4 Glasses of wine per week     Comment: 4 glasses of wine weekly  .  Drug Use: No  . Sexual Activity: Yes    Partners: Male   Other Topics Concern  . None   Social History Narrative  . None   Review of Systems - See HPI.  All other ROS are negative.  Filed Vitals:   10/03/13 1514  BP: 110/72  Pulse: 73  Temp: 98.3 F (36.8 C)  Resp: 16   Physical Exam  Vitals reviewed. Constitutional: She is oriented to person, place, and time and well-developed, well-nourished, and in no distress.  HENT:  Head: Normocephalic and atraumatic.  Right Ear: Tympanic membrane and external ear normal.  Left Ear: Tympanic membrane and external ear normal.  Nose: Nose normal.  Mouth/Throat: Uvula is midline, oropharynx is clear and moist and mucous membranes are normal. No oropharyngeal exudate, posterior oropharyngeal edema, posterior oropharyngeal erythema or tonsillar abscesses.  Eyes: Conjunctivae are normal. Pupils are equal, round, and reactive to light.  Neck: Neck supple.  Cardiovascular: Normal rate, regular rhythm, normal heart sounds and intact distal pulses.    Pulmonary/Chest: Effort normal and breath sounds normal. No respiratory distress. She has no wheezes. She has no rales. She exhibits no tenderness.  Lymphadenopathy:    She has no cervical adenopathy.  Neurological: She is alert and oriented to person, place, and time.  Skin: Skin is warm and dry. No rash noted.  Psychiatric: Affect normal.    Recent Results (from the past 2160 hour(s))  POCT INFLUENZA A/B     Status: None   Collection Time    10/03/13  3:41 PM      Result Value Range   Influenza A, POC Negative     Influenza B, POC Negative      Assessment/Plan: Viral URI with cough Flu swab negative.  Increase fluid intake. Rest. Saline nasal spray.  Multivitamin and/or zinc supplement.  Delsym for cough.  Mucinex if needed.  Tylenol for sore throat or aches.  Place a humidifier in bedroom.  Call or return to clinic if symptoms are not improving.

## 2013-10-03 NOTE — Progress Notes (Signed)
Pre visit review using our clinic review tool, if applicable. No additional management support is needed unless otherwise documented below in the visit note/SLS  

## 2013-10-03 NOTE — Patient Instructions (Signed)
Please increase fluid intake.  Rest.  Saline nasal spray.  Take a multivitamin.  Tylenol for throat pain.  Humidifier in bedroom.   Viral Infections A viral infection can be caused by different types of viruses.Most viral infections are not serious and resolve on their own. However, some infections may cause severe symptoms and may lead to further complications. SYMPTOMS Viruses can frequently cause:  Minor sore throat.  Aches and pains.  Headaches.  Runny nose.  Different types of rashes.  Watery eyes.  Tiredness.  Cough.  Loss of appetite.  Gastrointestinal infections, resulting in nausea, vomiting, and diarrhea. These symptoms do not respond to antibiotics because the infection is not caused by bacteria. However, you might catch a bacterial infection following the viral infection. This is sometimes called a "superinfection." Symptoms of such a bacterial infection may include:  Worsening sore throat with pus and difficulty swallowing.  Swollen neck glands.  Chills and a high or persistent fever.  Severe headache.  Tenderness over the sinuses.  Persistent overall ill feeling (malaise), muscle aches, and tiredness (fatigue).  Persistent cough.  Yellow, green, or brown mucus production with coughing. HOME CARE INSTRUCTIONS   Only take over-the-counter or prescription medicines for pain, discomfort, diarrhea, or fever as directed by your caregiver.  Drink enough water and fluids to keep your urine clear or pale yellow. Sports drinks can provide valuable electrolytes, sugars, and hydration.  Get plenty of rest and maintain proper nutrition. Soups and broths with crackers or rice are fine. SEEK IMMEDIATE MEDICAL CARE IF:   You have severe headaches, shortness of breath, chest pain, neck pain, or an unusual rash.  You have uncontrolled vomiting, diarrhea, or you are unable to keep down fluids.  You or your child has an oral temperature above 102 F (38.9 C), not  controlled by medicine.  Your baby is older than 3 months with a rectal temperature of 102 F (38.9 C) or higher.  Your baby is 43 months old or younger with a rectal temperature of 100.4 F (38 C) or higher. MAKE SURE YOU:   Understand these instructions.  Will watch your condition.  Will get help right away if you are not doing well or get worse. Document Released: 05/31/2005 Document Revised: 11/13/2011 Document Reviewed: 12/26/2010 [facility] Patient Information 2014 [place], [place].

## 2013-10-24 ENCOUNTER — Other Ambulatory Visit: Payer: Self-pay | Admitting: Internal Medicine

## 2013-11-03 ENCOUNTER — Other Ambulatory Visit: Payer: Self-pay | Admitting: Family Medicine

## 2013-12-15 ENCOUNTER — Telehealth: Payer: Self-pay | Admitting: Family Medicine

## 2013-12-15 DIAGNOSIS — I10 Essential (primary) hypertension: Secondary | ICD-10-CM

## 2013-12-15 DIAGNOSIS — R739 Hyperglycemia, unspecified: Secondary | ICD-10-CM

## 2013-12-15 DIAGNOSIS — E785 Hyperlipidemia, unspecified: Secondary | ICD-10-CM

## 2013-12-15 NOTE — Telephone Encounter (Signed)
Patient has appointment for 01/05/14 and states that she was told to come in prior.  She will be going to [facility] lab

## 2013-12-15 NOTE — Telephone Encounter (Signed)
Lab order placed:  Per Dr Rhae Lerner last note pt needs renal and A1C

## 2013-12-30 LAB — RENAL FUNCTION PANEL
Albumin: 4.3 g/dL (ref 3.5–5.2)
BUN: 16 mg/dL (ref 6–23)
CO2: 28 mEq/L (ref 19–32)
Calcium: 9.4 mg/dL (ref 8.4–10.5)
Chloride: 104 mEq/L (ref 96–112)
Creat: 0.75 mg/dL (ref 0.50–1.10)
Glucose, Bld: 100 mg/dL — ABNORMAL HIGH (ref 70–99)
Phosphorus: 4.1 mg/dL (ref 2.3–4.6)
Potassium: 4.7 mEq/L (ref 3.5–5.3)
Sodium: 141 mEq/L (ref 135–145)

## 2013-12-30 LAB — HEMOGLOBIN A1C
Hgb A1c MFr Bld: 6 % — ABNORMAL HIGH (ref <5.7)
Mean Plasma Glucose: 126 mg/dL — ABNORMAL HIGH (ref <117)

## 2014-01-05 ENCOUNTER — Ambulatory Visit (INDEPENDENT_AMBULATORY_CARE_PROVIDER_SITE_OTHER): Payer: BC Managed Care – PPO | Admitting: Family Medicine

## 2014-01-05 ENCOUNTER — Encounter: Payer: Self-pay | Admitting: Family Medicine

## 2014-01-05 ENCOUNTER — Telehealth: Payer: Self-pay | Admitting: Family Medicine

## 2014-01-05 VITALS — BP 124/72 | HR 81 | Temp 97.7°F | Ht 65.0 in | Wt 229.1 lb

## 2014-01-05 DIAGNOSIS — R259 Unspecified abnormal involuntary movements: Secondary | ICD-10-CM

## 2014-01-05 DIAGNOSIS — F341 Dysthymic disorder: Secondary | ICD-10-CM

## 2014-01-05 DIAGNOSIS — R739 Hyperglycemia, unspecified: Secondary | ICD-10-CM

## 2014-01-05 DIAGNOSIS — F418 Other specified anxiety disorders: Secondary | ICD-10-CM

## 2014-01-05 DIAGNOSIS — E669 Obesity, unspecified: Secondary | ICD-10-CM

## 2014-01-05 DIAGNOSIS — I10 Essential (primary) hypertension: Secondary | ICD-10-CM

## 2014-01-05 DIAGNOSIS — R7309 Other abnormal glucose: Secondary | ICD-10-CM

## 2014-01-05 DIAGNOSIS — R251 Tremor, unspecified: Secondary | ICD-10-CM

## 2014-01-05 MED ORDER — DULOXETINE HCL 60 MG PO CPEP: 60.0000 mg | ORAL_CAPSULE | Freq: Every day | ORAL | Status: DC

## 2014-01-05 MED ORDER — TELMISARTAN-HCTZ 80-25 MG PO TABS: ORAL_TABLET | ORAL | Status: DC

## 2014-01-05 MED ORDER — ESCITALOPRAM OXALATE 10 MG PO TABS: 10.0000 mg | ORAL_TABLET | Freq: Every day | ORAL | Status: DC

## 2014-01-05 NOTE — Telephone Encounter (Signed)
Relevant patient education assigned to patient using Emmi. ° °

## 2014-01-05 NOTE — Patient Instructions (Signed)

## 2014-01-05 NOTE — Progress Notes (Signed)
Pre visit review using our clinic review tool, if applicable. No additional management support is needed unless otherwise documented below in the visit note. 

## 2014-01-07 NOTE — Assessment & Plan Note (Signed)
Stable on current meds but looking forward to retiring in June

## 2014-01-07 NOTE — Assessment & Plan Note (Signed)
Encouraged DASH diet, decrease po intake and increase exercise as tolerated. Needs 7-8 hours of sleep nightly. Avoid trans fats, eat small, frequent meals every 4-5 hours with lean proteins, complex carbs and healthy fats. Minimize simple carbs, GMO foods. 

## 2014-01-07 NOTE — Progress Notes (Signed)
Patient ID: Kristin [name], female   DOB: 03-19-1951, 63 y.o.   MRN: [id number] Kristin [name] [id number] 1951-07-15 01/07/2014      Progress Note-Follow Up  Subjective  Chief Complaint  Chief Complaint  Patient presents with  . Medication Refill    HPI  Patient is a 63 year old female in today for routine medical care. He is doing fairly well. Is looking forward to retiring in June. Does note that her tremor is worsening. She notes it is worse in her right hand and she is beginning to feel some weakness in the right hand as well. It is worse when she is fatigued or anxious. Denies CP/palp/SOB/HA/congestion/fevers/GI or GU c/o. Taking meds as prescribed  Past Medical History  Diagnosis Date  . Fibroids   . Abnormal vaginal Pap smear   . Anemia   . GERD (gastroesophageal reflux disease)   . Hyperlipidemia   . Hypertension   . Arthritis   . Anxiety   . Obesity   . Hiatal hernia   . Esophageal stricture 1994  . Chronic UTI     sees dr Terance Hart  . Measles as a child  . Chicken pox as a child  . 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 Depression with anxiety 11/02/2009    Qualifier: Diagnosis of  By: [name], LPN, [name]   . Plantar fasciitis of left foot 06/19/2012  . Dermatitis 07/17/2012  . Urinary frequency 02/25/2013  . Acute pharyngitis 02/25/2013  . Hyperglycemia 08/19/2013    Past Surgical History  Procedure Laterality Date  . Wisdom teeth extracted    . Laporoscopy    . Tonsillectomy    . Abdominal hysterectomy  2006    total  . Partial hip arthroplasty  2010    right  . Esophageal      stretching    Family History  Problem Relation Age of Onset  . Cancer Mother     breast  . Other Mother     arrythmia  . Mental illness Mother     bipolar  . Heart disease Father   . Arthritis Father     rheumatoid  . Depression Sister   . Arthritis Sister   . Colon cancer Neg Hx   . Esophageal cancer Neg Hx   . Rectal cancer Neg Hx   . Stomach cancer Neg Hx      History   Social History  . Marital Status: Married    Spouse Name: N/A    Number of Children: N/A  . Years of Education: N/A   Occupational History  . Not on file.   Social History Main Topics  . Smoking status: Never Smoker   . Smokeless tobacco: Never Used  . Alcohol Use: 2.4 oz/week    4 Glasses of wine per week     Comment: 4 glasses of wine weekly  . Drug Use: No  . Sexual Activity: Yes    Partners: Male   Other Topics Concern  . Not on file   Social History Narrative  . No narrative on file    Current Outpatient Prescriptions on File Prior to Visit  Medication Sig Dispense Refill  . ALPRAZolam (XANAX) 0.5 MG tablet Take 1 tablet (0.5 mg total) by mouth 3 (three) times daily as needed for sleep or anxiety.  40 tablet  2  . CRESTOR 10 MG tablet TAKE 1 TABLET BY MOUTH ON MONDAYS, WEDNESDAYS, AND FRIDAYS  30 tablet  6  . [place]  1 krill oil caps daily, MegaRed caps by Schiff      . metoprolol succinate (TOPROL-XL) 25 MG 24 hr tablet take 1 tablet by mouth once daily  30 tablet  3  . omeprazole (PRILOSEC) 40 MG capsule Take 1 capsule (40 mg total) by mouth daily.  30 capsule  11   No current facility-administered medications on file prior to visit.    Allergies  Allergen Reactions  . Bactrim [Sulfamethoxazole-Trimethoprim] Other (See Comments)    Oral ulcers and rash  . Penicillins Hives    Review of Systems  Review of Systems  Constitutional: Positive for malaise/fatigue. Negative for fever.  HENT: Negative for congestion.   Eyes: Negative for discharge.  Respiratory: Negative for shortness of breath.   Cardiovascular: Negative for chest pain, palpitations and leg swelling.  Gastrointestinal: Negative for nausea, abdominal pain and diarrhea.  Genitourinary: Negative for dysuria.  Musculoskeletal: Negative for falls.  Skin: Negative for rash.  Neurological: Positive for tremors and focal weakness. Negative for loss of consciousness and  headaches.  Endo/Heme/Allergies: Negative for polydipsia.  Psychiatric/Behavioral: Positive for depression. Negative for suicidal ideas. The patient is not nervous/anxious and does not have insomnia.     Objective  BP 124/72  Pulse 81  Temp(Src) 97.7 F (36.5 C) (Oral)  Ht 5\' 5"  (1.651 m)  Wt 229 lb 1.9 oz (103.928 kg)  BMI 38.13 kg/m2  SpO2 96%  Physical Exam  Physical Exam  Constitutional: She is oriented to person, place, and time and well-developed, well-nourished, and in no distress. No distress.  HENT:  Head: Normocephalic and atraumatic.  Eyes: Conjunctivae are normal.  Neck: Neck supple. No thyromegaly present.  Cardiovascular: Normal rate, regular rhythm and normal heart sounds.   No murmur heard. Pulmonary/Chest: Effort normal and breath sounds normal. She has no wheezes.  Abdominal: She exhibits no distension and no mass.  Musculoskeletal: She exhibits no edema.  Lymphadenopathy:    She has no cervical adenopathy.  Neurological: She is alert and oriented to person, place, and time. No cranial nerve deficit. GCS score is 15.  Fine tremor in hands noted  Skin: Skin is warm and dry. No rash noted. She is not diaphoretic.  Psychiatric: Memory, affect and judgment normal.    Lab Results  Component Value Date   TSH 1.280 04/07/2013   Lab Results  Component Value Date   WBC 5.7 04/07/2013   HGB 13.1 04/07/2013   HCT 36.5 04/07/2013   MCV 88.2 04/07/2013   PLT 300 04/07/2013   Lab Results  Component Value Date   CREATININE 0.75 12/29/2013   BUN 16 12/29/2013   NA 141 12/29/2013   K 4.7 12/29/2013   CL 104 12/29/2013   CO2 28 12/29/2013   Lab Results  Component Value Date   ALT 19 04/07/2013   AST 19 04/07/2013   ALKPHOS 84 04/07/2013   BILITOT 0.5 04/07/2013   Lab Results  Component Value Date   CHOL 190 04/07/2013   Lab Results  Component Value Date   HDL 51 04/07/2013   Lab Results  Component Value Date   LDLCALC 109* 04/07/2013   Lab Results  Component Value Date    TRIG 148 04/07/2013   Lab Results  Component Value Date   CHOLHDL 3.7 04/07/2013     Assessment & Plan  Tremor Worsening and is noting some weakness in her right hand now as well. Will refer to neurology for further consideration at this time.  OBESITY, UNSPECIFIED Encouraged DASH  diet, decrease po intake and increase exercise as tolerated. Needs 7-8 hours of sleep nightly. Avoid trans fats, eat small, frequent meals every 4-5 hours with lean proteins, complex carbs and healthy fats. Minimize simple carbs, GMO foods.  Hyperglycemia hgba1c acceptable, minimize simple carbs. Increase exercise as tolerated.   HYPERTENSION Denies CP/palp/SOB/HA/congestion/fevers/GI or GU c/o. Taking meds as prescribed  Depression with anxiety Stable on current meds but looking forward to retiring in June

## 2014-01-07 NOTE — Assessment & Plan Note (Signed)
Worsening and is noting some weakness in her right hand now as well. Will refer to neurology for further consideration at this time.

## 2014-01-07 NOTE — Assessment & Plan Note (Signed)
Denies CP/palp/SOB/HA/congestion/fevers/GI or GU c/o. Taking meds as prescribed 

## 2014-01-07 NOTE — Assessment & Plan Note (Signed)
hgba1c acceptable, minimize simple carbs. Increase exercise as tolerated.  

## 2014-01-08 ENCOUNTER — Encounter: Payer: Self-pay | Admitting: Neurology

## 2014-01-08 ENCOUNTER — Ambulatory Visit (INDEPENDENT_AMBULATORY_CARE_PROVIDER_SITE_OTHER): Payer: BC Managed Care – PPO | Admitting: Neurology

## 2014-01-08 VITALS — BP 110/64 | HR 76 | Resp 16 | Ht 65.0 in | Wt 227.0 lb

## 2014-01-08 DIAGNOSIS — F341 Dysthymic disorder: Secondary | ICD-10-CM

## 2014-01-08 DIAGNOSIS — F418 Other specified anxiety disorders: Secondary | ICD-10-CM

## 2014-01-08 DIAGNOSIS — G20 Parkinson's disease: Secondary | ICD-10-CM | POA: Insufficient documentation

## 2014-01-08 MED ORDER — PRAMIPEXOLE DIHYDROCHLORIDE 0.5 MG PO TABS: 0.5000 mg | ORAL_TABLET | Freq: Three times a day (TID) | ORAL | Status: DC

## 2014-01-08 MED ORDER — PRAMIPEXOLE DIHYDROCHLORIDE 0.125 MG PO TABS: 0.1250 mg | ORAL_TABLET | Freq: Three times a day (TID) | ORAL | Status: DC

## 2014-01-08 NOTE — Progress Notes (Signed)
Subjective:    Kristin [name] was seen in consultation in the movement disorder clinic at the request of Kristin Homans, MD.  The evaluation is for tremor.  The patient is a 63 y.o. left handed female with a history of tremor.  The records that were made available to me were reviewed.  According to records, the patient has complained about tremor to her previous primary care physician all the way back to 2012, but in those records she had said that tremor had been going on for years.  She states today, however, that she really thinks that it started in 2012.  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  R hand was always worse even though she is L hand dominant.  It has slowly progressed.  She reports that tremor is in both hands now (R still worse) but she feels tremor on the inside of the body all of the time.  She has tremor at rest now.  She feels nervous now and is unsure if it was related.  She also has the sweats and so she was placed on xanax but that doesn't help the sweating.  She recently went to an exercise class and noted that she wasn't as coordinated/strong with the R hand and that worried her and that is why she wanted a further evaluation.    She did see Dr. Krista [name] in June, 2013 and I reviewed that note.  She was diagnosed with essential tremor.  No treatment was recommended.  There is  family hx of tremor in her father.  She states that she has been on metoprolol since 2012 and didn't think that it helped but when she tried to get off of it a year ago, the tremor got worse and she couldn't get off of it.    Affected by caffeine:  yes (1 large cup per day) Affected by alcohol:  yes Affected by stress:  yes Affected by fatigue:  yes Spills soup if on spoon:  yes Spills glass of liquid if full:  no Affects ADL's (tying shoes, brushing teeth, etc):  no except mascara  No voice changes Some trouble getting to sleep  -no vivid dreams, rare sleep  talk No loss of smell/taste Handwriting: more trouble with coordination but no changed size Feels like has to concentrate on walking, but doesn't think that it looks different when people watch her walk No falls Some trouble getting out of chair/couch/car  Current/Previously tried tremor medications: metoprololol  Current medications that may exacerbate tremor:  Cymbalta, although I reviewed that the patient did try to change to Pristiq but the patient had nightmares with Pristiq and changed back to Cymbalta  Outside reports reviewed: historical medical records, lab reports and referral letter/letters.  Allergies  Allergen Reactions  . Bactrim [Sulfamethoxazole-Trimethoprim] Other (See Comments)    Oral ulcers and rash  . Penicillins Hives    Current Outpatient Prescriptions on File Prior to Visit  Medication Sig Dispense Refill  . ALPRAZolam (XANAX) 0.5 MG tablet Take 1 tablet (0.5 mg total) by mouth 3 (three) times daily as needed for sleep or anxiety.  40 tablet  2  . CRESTOR 10 MG tablet TAKE 1 TABLET BY MOUTH ON MONDAYS, WEDNESDAYS, AND FRIDAYS  30 tablet  6  . DULoxetine (CYMBALTA) 60 MG capsule Take 1 capsule (60 mg total) by mouth daily. In am  90 capsule  3  . escitalopram (LEXAPRO) 10 MG tablet Take 1  tablet (10 mg total) by mouth daily. In pm  90 tablet  3  . Krill Oil CAPS 1 krill oil caps daily, MegaRed caps by Schiff      . metoprolol succinate (TOPROL-XL) 25 MG 24 hr tablet take 1 tablet by mouth once daily  30 tablet  3  . omeprazole (PRILOSEC) 40 MG capsule Take 1 capsule (40 mg total) by mouth daily.  30 capsule  11  . telmisartan-hydrochlorothiazide (MICARDIS HCT) 80-25 MG per tablet 1/2 tab po daily  90 tablet  3   No current facility-administered medications on file prior to visit.    Past Medical History  Diagnosis Date  . Fibroids   . Abnormal vaginal Pap smear   . Anemia   . GERD (gastroesophageal reflux disease)   . Hyperlipidemia   . Hypertension     . Arthritis   . Anxiety   . Obesity   . Hiatal hernia   . Esophageal stricture 1994  . Chronic UTI     sees dr Kristin [name]  . Measles as a child  . Chicken pox as a child  . 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 Depression with anxiety 11/02/2009    Qualifier: Diagnosis of  By: [name], LPN, [name]   . Plantar fasciitis of left foot 06/19/2012  . Dermatitis 07/17/2012  . Urinary frequency 02/25/2013  . Acute pharyngitis 02/25/2013  . Hyperglycemia 08/19/2013    Past Surgical History  Procedure Laterality Date  . Wisdom teeth extracted    . Laporoscopy    . Tonsillectomy    . Abdominal hysterectomy  2006    total  . Partial hip arthroplasty  2010    right  . Esophageal      stretching    History   Social History  . Marital Status: Married    Spouse Name: N/A    Number of Children: N/A  . Years of Education: N/A   Occupational History  . Not on file.   Social History Main Topics  . Smoking status: Never Smoker   . Smokeless tobacco: Never Used  . Alcohol Use: 2.4 oz/week    4 Glasses of wine per week     Comment: 4 glasses of wine weekly  . Drug Use: No  . Sexual Activity: Yes    Partners: Male   Other Topics Concern  . Not on file   Social History Narrative  . No narrative on file    Family Status  Relation Status Death Age  . Mother Deceased 43    Dementia, breast cancer, bipolar  . Father Deceased 38    heart failure, rheumatoid arthritis  . Sister Alive     arthritis  . Sister Alive   . Daughter Alive     63  . Son Alive     53  . Maternal Grandmother Deceased   . Maternal Grandfather Deceased   . Paternal Grandmother Deceased   . Paternal Grandfather Deceased   . Sister Alive      healthy  . Sister Alive     healthy  . Daughter Alive     28/ healthy    Review of Systems A complete 10 system ROS was obtained and was negative apart from what is mentioned.   Objective:   VITALS:   Filed Vitals:   01/08/14 0912  BP: 110/64   Pulse: 76  Resp: 16  Height: 5\' 5"  (1.651 m)  Weight: 227 lb (102.967 kg)   Gen:  Appears  stated age and in NAD. HEENT:  Normocephalic, atraumatic. The mucous membranes are moist. The superficial temporal arteries are without ropiness or tenderness. Cardiovascular: Regular rate and rhythm. Lungs: Clear to auscultation bilaterally. Neck: There are no carotid bruits noted bilaterally.  NEUROLOGICAL:  Orientation: The patient is alert and oriented x3. Fund of knowledge is appropriate.  Recent and remote memory are intact.  Attention and concentration are normal.    Able to name objects and repeat phrases. Cranial nerves: There is good facial symmetry. Pupils are equal round and reactive to light bilaterally. Fundoscopic exam reveals clear margins bilaterally. Extraocular muscles are intact. There are no square wave jerks.  The visual fields are full to confrontational testing. The speech is fluent and clear. Soft palate rises symmetrically and there is no tongue deviation. Hearing is intact to conversational tone. Sensation: Sensation is intact to light and pinprick throughout (facial, trunk, extremities). Vibration is intact at the bilateral big toe. There is no extinction with double simultaneous stimulation. There is no sensory dermatomal level identified. Motor: Strength is 5/5 in the bilateral upper and lower extremities.   Shoulder shrug is equal and symmetric.  There is no pronator drift. Deep tendon reflexes: Deep tendon reflexes are 2+/4 at the bilateral biceps, triceps, brachioradialis, patella and achilles. Plantar responses are downgoing bilaterally.  Movement examination: Tone: There is increased tone in the right upper and right lower extremity.  Tone in the left upper and left lower extremity was normal.  Abnormal movements: There is a near constant right upper extremity resting tremor.  There is an intermittent right lower extremity and also an intermittent left lower extremity  resting tremor.  Tremor increases with distraction procedures. Coordination:  There is definite decremation with RAM's, seen most significantly on the right with finger taps, hand opening and closing, heel taps and toe taps. Gait and Station: The patient has no significant difficulty arising out of a deep-seated chair without the use of the hands. The patient's stride length is fairly normal, but there is marked decreased arm swing on the right.  The patient has a negative pull test.      LABS  Lab Results  Component Value Date   TSH 1.280 04/07/2013   Lab Results  Component Value Date   WBC 5.7 04/07/2013   HGB 13.1 04/07/2013   HCT 36.5 04/07/2013   MCV 88.2 04/07/2013   PLT 300 04/07/2013     Chemistry      Component Value Date/Time   NA 141 12/29/2013 0850   K 4.7 12/29/2013 0850   CL 104 12/29/2013 0850   CO2 28 12/29/2013 0850   BUN 16 12/29/2013 0850   CREATININE 0.75 12/29/2013 0850   CREATININE 0.8 02/15/2012 0941      Component Value Date/Time   CALCIUM 9.4 12/29/2013 0850   ALKPHOS 84 04/07/2013 0923   AST 19 04/07/2013 0923   ALT 19 04/07/2013 0923   BILITOT 0.5 04/07/2013 0923          Assessment/Plan:   1.  Parkinsonism.  I suspect that this does represent idiopathic Parkinson's disease.  The patient has tremor, bradykinesia, rigidity and mild postural instability.  -We discussed the diagnosis as well as pathophysiology of the disease.  We discussed treatment options as well as prognostic indicators.  Patient education was provided.  -Greater than 50% of the 80 minute visit was spent in counseling answering questions and talking about what to expect now as well as in the future.  We talked  about medication options as well as potential future surgical options.  We talked about safety in the home.  Patient education was provided.  She was given resources in the community.  -We decided to add pramipexole 0.125 mg tid and slowly work up to 0.5 mg tid.  Risks, benefits, side effects and  alternative therapies were discussed.  The opportunity to ask questions was given and they were answered to the best of my ability.  The patient expressed understanding and willingness to follow the outlined treatment protocols.  -I will refer the patient [facility], for PT/OT.  We talked about the importance of cardiovascular exercise in Parkinson's disease. 2.  Depression  -This certainly could be associated with Parkinson's disease, or could be independent of it.  I do think that potentially the sweating could be associated with the Cymbalta.  She is going to talk to her primary care physician about potentially discontinuing the Cymbalta and perhaps increasing the Lexapro and using it alone.  I will leave this to the expertise of BLYTH, Erline Levine, MD. 3.  HTN  -She is currently on 2 different antihypertensive medications, but the metoprolol has been for her "essential tremor" in the past.  She may not need that, but I did not want to wean back at the same time that it was starting her Mirapex.  She has tried to wean that in the past and her tremor got worse.  I did not want to change too many things at once.  We will address this at future visits. 4.  Return in about 2 months (around 03/10/2014).

## 2014-01-08 NOTE — Addendum Note (Signed)
Addended byMargarette Asal L on: 01/08/2014 11:09 AM   Modules accepted: Orders

## 2014-01-08 NOTE — Patient Instructions (Addendum)
1. Start mirapex (pramipexole) as follows:  0.125 mg - 1 tablet three times per day for a week, then 2 tablets three times per day for a week and then fill the 0.5 mg tablet and take that, 1 pill three times per day. These prescriptions have been sent to your pharmacy.  2. You have been referred to [facility] for physical and occupational therapy. They will call you directly to schedule an appointment.  Please call [phone] if you do not hear from them.  3. Follow up 8 weeks.

## 2014-01-19 ENCOUNTER — Ambulatory Visit: Payer: BC Managed Care – PPO | Attending: Neurology | Admitting: Occupational Therapy

## 2014-01-19 DIAGNOSIS — M629 Disorder of muscle, unspecified: Secondary | ICD-10-CM | POA: Diagnosis not present

## 2014-01-19 DIAGNOSIS — R279 Unspecified lack of coordination: Secondary | ICD-10-CM | POA: Diagnosis not present

## 2014-01-19 DIAGNOSIS — M62838 Other muscle spasm: Secondary | ICD-10-CM | POA: Diagnosis not present

## 2014-01-19 DIAGNOSIS — IMO0001 Reserved for inherently not codable concepts without codable children: Secondary | ICD-10-CM | POA: Insufficient documentation

## 2014-01-19 DIAGNOSIS — G20 Parkinson's disease: Secondary | ICD-10-CM | POA: Diagnosis not present

## 2014-01-19 DIAGNOSIS — G20A1 Parkinson's disease without dyskinesia, without mention of fluctuations: Secondary | ICD-10-CM | POA: Insufficient documentation

## 2014-01-19 DIAGNOSIS — M2420 Disorder of ligament, unspecified site: Secondary | ICD-10-CM | POA: Insufficient documentation

## 2014-01-22 ENCOUNTER — Other Ambulatory Visit: Payer: Self-pay | Admitting: Neurology

## 2014-01-22 ENCOUNTER — Ambulatory Visit: Payer: BC Managed Care – PPO | Admitting: Occupational Therapy

## 2014-01-22 DIAGNOSIS — IMO0001 Reserved for inherently not codable concepts without codable children: Secondary | ICD-10-CM | POA: Diagnosis not present

## 2014-01-22 DIAGNOSIS — G20 Parkinson's disease: Secondary | ICD-10-CM

## 2014-01-22 DIAGNOSIS — R1310 Dysphagia, unspecified: Secondary | ICD-10-CM

## 2014-01-22 DIAGNOSIS — G20A1 Parkinson's disease without dyskinesia, without mention of fluctuations: Secondary | ICD-10-CM

## 2014-01-28 ENCOUNTER — Ambulatory Visit: Payer: BC Managed Care – PPO | Admitting: Occupational Therapy

## 2014-01-28 DIAGNOSIS — IMO0001 Reserved for inherently not codable concepts without codable children: Secondary | ICD-10-CM | POA: Diagnosis not present

## 2014-01-29 ENCOUNTER — Encounter: Payer: BC Managed Care – PPO | Admitting: Occupational Therapy

## 2014-02-02 ENCOUNTER — Ambulatory Visit: Payer: BC Managed Care – PPO | Attending: Neurology | Admitting: Physical Therapy

## 2014-02-02 DIAGNOSIS — G20 Parkinson's disease: Secondary | ICD-10-CM | POA: Diagnosis not present

## 2014-02-02 DIAGNOSIS — IMO0001 Reserved for inherently not codable concepts without codable children: Secondary | ICD-10-CM | POA: Insufficient documentation

## 2014-02-02 DIAGNOSIS — G20A1 Parkinson's disease without dyskinesia, without mention of fluctuations: Secondary | ICD-10-CM | POA: Insufficient documentation

## 2014-02-02 DIAGNOSIS — R279 Unspecified lack of coordination: Secondary | ICD-10-CM | POA: Diagnosis not present

## 2014-02-02 DIAGNOSIS — R269 Unspecified abnormalities of gait and mobility: Secondary | ICD-10-CM | POA: Insufficient documentation

## 2014-02-02 DIAGNOSIS — R29898 Other symptoms and signs involving the musculoskeletal system: Secondary | ICD-10-CM | POA: Diagnosis not present

## 2014-02-03 ENCOUNTER — Ambulatory Visit: Payer: BC Managed Care – PPO | Admitting: Occupational Therapy

## 2014-02-03 DIAGNOSIS — IMO0001 Reserved for inherently not codable concepts without codable children: Secondary | ICD-10-CM | POA: Diagnosis not present

## 2014-02-05 ENCOUNTER — Ambulatory Visit: Payer: BC Managed Care – PPO | Admitting: Occupational Therapy

## 2014-02-05 DIAGNOSIS — IMO0001 Reserved for inherently not codable concepts without codable children: Secondary | ICD-10-CM | POA: Diagnosis not present

## 2014-02-09 ENCOUNTER — Ambulatory Visit: Payer: BC Managed Care – PPO

## 2014-02-09 DIAGNOSIS — IMO0001 Reserved for inherently not codable concepts without codable children: Secondary | ICD-10-CM | POA: Diagnosis not present

## 2014-02-10 ENCOUNTER — Ambulatory Visit: Payer: BC Managed Care – PPO | Admitting: Physical Therapy

## 2014-02-10 ENCOUNTER — Other Ambulatory Visit (HOSPITAL_COMMUNITY): Payer: Self-pay | Admitting: Neurology

## 2014-02-10 ENCOUNTER — Ambulatory Visit: Payer: BC Managed Care – PPO | Admitting: Occupational Therapy

## 2014-02-10 ENCOUNTER — Telehealth: Payer: Self-pay | Admitting: Neurology

## 2014-02-10 DIAGNOSIS — IMO0001 Reserved for inherently not codable concepts without codable children: Secondary | ICD-10-CM | POA: Diagnosis not present

## 2014-02-10 DIAGNOSIS — G20 Parkinson's disease: Secondary | ICD-10-CM

## 2014-02-10 DIAGNOSIS — R1310 Dysphagia, unspecified: Secondary | ICD-10-CM

## 2014-02-10 NOTE — Telephone Encounter (Signed)
Kristin [name], can you place this into a phone note in epic and order? From: Garald Balding  Sent: Tuesday, February 10, 2014 10:23 AM To: Alonza Bogus Subject: pt [name] Hi [name] I saw Kristin [name] yesterday. Nice lady.  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 As far as I can tell, esophagram/barium swallow was not ordered nor completed. Pt underwent a hipreplacement shortly after the MBSS - maybe it was forgotten due to the urgent/acute hip replacement(?).  At any rate, I am recommending a follow up MBSS, and would suggest a barium swallow/esophagram after the MBSS as well, if you feel it's necessary. Glendell Docker

## 2014-02-10 NOTE — Telephone Encounter (Signed)
Left message on machine for patient to call back. MBS set up for 02/18/14 at 1:00 pm at [facility] - 1st floor radiology. Awaiting call back to make patient aware of appt date/time.

## 2014-02-12 ENCOUNTER — Ambulatory Visit: Payer: BC Managed Care – PPO | Admitting: Physical Therapy

## 2014-02-12 ENCOUNTER — Ambulatory Visit: Payer: BC Managed Care – PPO | Admitting: Occupational Therapy

## 2014-02-12 DIAGNOSIS — IMO0001 Reserved for inherently not codable concepts without codable children: Secondary | ICD-10-CM | POA: Diagnosis not present

## 2014-02-12 NOTE — Telephone Encounter (Signed)
Patient made aware of appt date/time.  

## 2014-02-12 NOTE — Telephone Encounter (Signed)
Please call patient she is returning your call [phone]

## 2014-02-12 NOTE — Telephone Encounter (Signed)
Left message on machine for patient to call back.

## 2014-02-16 ENCOUNTER — Ambulatory Visit: Payer: BC Managed Care – PPO

## 2014-02-16 DIAGNOSIS — IMO0001 Reserved for inherently not codable concepts without codable children: Secondary | ICD-10-CM | POA: Diagnosis not present

## 2014-02-17 ENCOUNTER — Ambulatory Visit: Payer: BC Managed Care – PPO | Admitting: Physical Therapy

## 2014-02-17 ENCOUNTER — Ambulatory Visit: Payer: BC Managed Care – PPO | Admitting: Occupational Therapy

## 2014-02-17 DIAGNOSIS — IMO0001 Reserved for inherently not codable concepts without codable children: Secondary | ICD-10-CM | POA: Diagnosis not present

## 2014-02-18 ENCOUNTER — Encounter: Payer: BC Managed Care – PPO | Admitting: Speech Pathology

## 2014-02-18 ENCOUNTER — Other Ambulatory Visit (HOSPITAL_COMMUNITY): Payer: BC Managed Care – PPO

## 2014-02-18 ENCOUNTER — Ambulatory Visit (HOSPITAL_COMMUNITY): Admission: RE | Admit: 2014-02-18 | Payer: BC Managed Care – PPO | Source: Ambulatory Visit

## 2014-02-19 ENCOUNTER — Ambulatory Visit: Payer: BC Managed Care – PPO | Admitting: Physical Therapy

## 2014-02-19 ENCOUNTER — Ambulatory Visit: Payer: BC Managed Care – PPO | Admitting: Occupational Therapy

## 2014-02-19 DIAGNOSIS — IMO0001 Reserved for inherently not codable concepts without codable children: Secondary | ICD-10-CM | POA: Diagnosis not present

## 2014-02-22 ENCOUNTER — Other Ambulatory Visit: Payer: Self-pay | Admitting: Family

## 2014-02-23 ENCOUNTER — Ambulatory Visit: Payer: BC Managed Care – PPO | Admitting: Occupational Therapy

## 2014-02-23 ENCOUNTER — Ambulatory Visit: Payer: BC Managed Care – PPO | Admitting: Physical Therapy

## 2014-02-23 DIAGNOSIS — IMO0001 Reserved for inherently not codable concepts without codable children: Secondary | ICD-10-CM | POA: Diagnosis not present

## 2014-02-25 ENCOUNTER — Ambulatory Visit (HOSPITAL_COMMUNITY)
Admission: RE | Admit: 2014-02-25 | Discharge: 2014-02-25 | Disposition: A | Discharge status: Home / Self Care | Payer: BC Managed Care – PPO | Source: Ambulatory Visit | Attending: Neurology | Admitting: Neurology

## 2014-02-25 ENCOUNTER — Ambulatory Visit: Payer: BC Managed Care – PPO | Admitting: Physical Therapy

## 2014-02-25 ENCOUNTER — Encounter: Payer: BC Managed Care – PPO | Admitting: Occupational Therapy

## 2014-02-25 DIAGNOSIS — R1310 Dysphagia, unspecified: Secondary | ICD-10-CM | POA: Insufficient documentation

## 2014-02-25 NOTE — Procedures (Signed)
Objective Swallowing Evaluation: Modified Barium Swallowing Study  Patient Details  Name: Kristin [name] MRN: [id number] Date of Birth: 02/10/1951  Today's Date: 02/25/2014 Time: 8676-1950 SLP Time Calculation (min): 29 min  Past Medical History:  Past Medical History  Diagnosis Date  . Fibroids   . Abnormal vaginal Pap smear   . Anemia   . GERD (gastroesophageal reflux disease)   . Hyperlipidemia   . Hypertension   . Arthritis   . Anxiety   . Obesity   . Hiatal hernia   . Esophageal stricture 1994  . Chronic UTI     sees dr Terance Hart  . Measles as a child  . Chicken pox as a child  . 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 Depression with anxiety 11/02/2009    Qualifier: Diagnosis of  By: [name], LPN, [name]   . Plantar fasciitis of left foot 06/19/2012  . Dermatitis 07/17/2012  . Urinary frequency 02/25/2013  . Acute pharyngitis 02/25/2013  . Hyperglycemia 08/19/2013   Past Surgical History:  Past Surgical History  Procedure Laterality Date  . Wisdom teeth extracted    . Laporoscopy    . Tonsillectomy    . Abdominal hysterectomy  2006    total  . Partial hip arthroplasty  2010    right  . Esophageal      stretching   HPI:  63 yo female referred for MBS as pt has PD and h/o dysphagia.  Pt underwent previous MBS in April 2010 that was normal except for CP bar and evaluating therapist recommended follow up with dedicated esophageal evaluation.  Pt also with h/o GERD, anemia, UTi, acute pharyngitis 02/2013.  Pt also previously underwent EGD in 1994 showing moderate sized HH and distal stricture s/p dilatation, she has refractory pyrosis and MD recommended strict antireflux measures ongoing.   Pt had been scheduled for endoscopy in April 2010 but did not follow through as she was having orthopedic surgery at that time.   Medication list included Lexapro, Xanax, Cymbalta, Prilosec, toprol XL, Crestor.  Pt admits to occasional choking on liquids - mostly water x 5 years that has  not worsened.  She denies weight loss, pulmonary infections nor requiring heimlich manuever.  Pt decreases her symptoms by taking small bites, small sips, etc.       Assessment / Plan / Recommendation Clinical Impression  Dysphagia Diagnosis: Mild cervical esophageal phase dysphagia  Clinical impression: Pt presents with normal oropharyngeal swallow ability that was timely and strong without oropharyngeal residuals.  Pt does take small bites/sips which SLP suspects is compensatory for ongoing mild dysphagia symptoms.   Unfortunately pt's symptoms of choking were not reproduced during today's eval even when asking pt to consume sequential boluses of thin via straw.     Suspect mild cervical esophageal dysphagia due to prominent cricopharyngeus (? due to impaired timing of UES opening).   UES clearance appeared adequate with all boluses except barium tablet.  Barium tablet taken with thin barium appeared to lodge below UES WITH pt awareness.  Further thin boluses transited tablet distally into esophagus but again appeared to lodge (? near aortic arch) WITHOUT pt awareness.  Water consumption appeared to adequately transit tablet through esophagus.    Recommend pt consume plenty of liquids with medications and meals and masticate food thoroughly.  Thanks for this referral.      Treatment Recommendation  Defer treatment plan to SLP at (Comment) (outpt)    Diet Recommendation Regular;Thin liquid   Liquid Administration via: Cup;Straw  Medication Administration: Whole meds with liquid (start with liquid first to moisten GI tract ) Supervision: Patient able to self feed Compensations: Slow rate;Small sips/bites Postural Changes and/or Swallow Maneuvers: Seated upright 90 degrees;Upright 30-60 min after meal    Other  Recommendations Oral Care Recommendations: Oral care BID     General Date of Onset: 02/25/14 HPI: 63 yo female referred for MBS as pt has PD and h/o dysphagia.  Pt underwent  previous MBS in April 2010 that was normal except for CP bar and evaluating therapist recommended follow up with dedicated esophageal evaluation.  Pt also with h/o GERD, anemia, UTi, acute pharyngitis 02/2013.  Pt also previously underwent EGD in 1994 showing moderate sized HH and distal stricture s/p dilatation, she has refractory pyrosis and MD recommended strict antireflux measures ongoing.   Pt had been scheduled for endoscopy in April 2010 but did not follow through as she was having orthopedic surgery at that time.   Medication list included Lexapro, Xanax, Cymbalta, Prilosec, toprol XL, Crestor.  Pt admits to occasional choking on liquids - mostly water x 5 years that has not worsened.  She denies weight loss, pulmonary infections nor requiring heimlich manuever.  Pt decreases her symptoms by taking small bites, small sips, etc.   Type of Study: Modified Barium Swallowing Study Reason for Referral: Objectively evaluate swallowing function Diet Prior to this Study: Regular;Thin liquids Temperature Spikes Noted: No Respiratory Status: Room air History of Recent Intubation: No Behavior/Cognition: Alert;Cooperative;Pleasant mood Oral Cavity - Dentition: Adequate natural dentition Oral Motor / Sensory Function: Within functional limits Self-Feeding Abilities: Able to feed self (has a slight tremor ) Patient Positioning: Upright in chair Baseline Vocal Quality: Clear Volitional Cough: Strong Volitional Swallow: Able to elicit Anatomy: Within functional limits Pharyngeal Secretions: Not observed secondary MBS    Reason for Referral Objectively evaluate swallowing function   Oral Phase Oral Preparation/Oral Phase Oral Phase: WFL   Pharyngeal Phase Pharyngeal Phase Pharyngeal Phase: Within functional limits  Cervical Esophageal Phase    GO    Cervical Esophageal Phase Cervical Esophageal Phase: Impaired Cervical Esophageal Phase - Nectar Nectar Cup: Prominent cricopharyngeal  segment;Reduced cricopharyngeal relaxation Cervical Esophageal Phase - Thin Thin Cup: Prominent cricopharyngeal segment;Reduced cricopharyngeal relaxation Thin Straw: Prominent cricopharyngeal segment;Reduced cricopharyngeal relaxation Cervical Esophageal Phase - Solids Puree: Prominent cricopharyngeal segment Regular: Prominent cricopharyngeal segment;Reduced cricopharyngeal relaxation Pill: Prominent cricopharyngeal segment;Reduced cricopharyngeal relaxation Cervical Esophageal Phase - Comment Cervical Esophageal Comment: barium tablet taken with thin barium appeared to lodge below UES with pt awareness, furhter sips transited tablet furhter into esophagus but again appeared to lodge (? near aortic arch) WITHOUT pt awareness, water consumption facilitated clearance    Functional Assessment Tool Used: mbs, clinical judgement Functional Limitations: Swallowing Swallow Current Status (J5009): At least 20 percent but less than 40 percent impaired, limited or restricted Swallow Goal Status [phone]): At least 20 percent but less than 40 percent impaired, limited or restricted Swallow Discharge Status [phone]): At least 20 percent but less than 40 percent impaired, limited or restricted    Claudie Fisherman, [place] [facility] SLP [phone]

## 2014-03-02 ENCOUNTER — Ambulatory Visit: Payer: BC Managed Care – PPO | Admitting: Occupational Therapy

## 2014-03-02 ENCOUNTER — Ambulatory Visit: Payer: BC Managed Care – PPO | Admitting: Physical Therapy

## 2014-03-02 DIAGNOSIS — IMO0001 Reserved for inherently not codable concepts without codable children: Secondary | ICD-10-CM | POA: Diagnosis not present

## 2014-03-04 ENCOUNTER — Ambulatory Visit: Payer: BC Managed Care – PPO | Attending: Neurology | Admitting: Occupational Therapy

## 2014-03-04 ENCOUNTER — Ambulatory Visit: Payer: BC Managed Care – PPO | Admitting: Physical Therapy

## 2014-03-04 ENCOUNTER — Ambulatory Visit: Payer: BC Managed Care – PPO

## 2014-03-04 DIAGNOSIS — R29898 Other symptoms and signs involving the musculoskeletal system: Secondary | ICD-10-CM | POA: Diagnosis not present

## 2014-03-04 DIAGNOSIS — IMO0001 Reserved for inherently not codable concepts without codable children: Secondary | ICD-10-CM | POA: Diagnosis present

## 2014-03-04 DIAGNOSIS — R269 Unspecified abnormalities of gait and mobility: Secondary | ICD-10-CM | POA: Insufficient documentation

## 2014-03-04 DIAGNOSIS — R279 Unspecified lack of coordination: Secondary | ICD-10-CM | POA: Insufficient documentation

## 2014-03-04 DIAGNOSIS — G20A1 Parkinson's disease without dyskinesia, without mention of fluctuations: Secondary | ICD-10-CM | POA: Insufficient documentation

## 2014-03-04 DIAGNOSIS — G20 Parkinson's disease: Secondary | ICD-10-CM | POA: Insufficient documentation

## 2014-03-09 ENCOUNTER — Encounter: Payer: Self-pay | Admitting: Neurology

## 2014-03-09 ENCOUNTER — Ambulatory Visit (INDEPENDENT_AMBULATORY_CARE_PROVIDER_SITE_OTHER): Payer: BC Managed Care – PPO | Admitting: Neurology

## 2014-03-09 VITALS — BP 106/68 | HR 76 | Resp 20 | Ht 65.0 in | Wt 225.0 lb

## 2014-03-09 DIAGNOSIS — G20A1 Parkinson's disease without dyskinesia, without mention of fluctuations: Secondary | ICD-10-CM

## 2014-03-09 DIAGNOSIS — G20 Parkinson's disease: Secondary | ICD-10-CM

## 2014-03-09 DIAGNOSIS — F418 Other specified anxiety disorders: Secondary | ICD-10-CM

## 2014-03-09 DIAGNOSIS — F341 Dysthymic disorder: Secondary | ICD-10-CM

## 2014-03-09 DIAGNOSIS — I10 Essential (primary) hypertension: Secondary | ICD-10-CM

## 2014-03-09 NOTE — Progress Notes (Signed)
Subjective:    Kristin [name] was seen in consultation in the movement disorder clinic at the request of [name], MD.  The evaluation is for tremor.  The patient is a 63 y.o. left handed female with a history of tremor.  The records that were made available to me were reviewed.  According to records, the patient has complained about tremor to her previous primary care physician all the way back to 2012, but in those records she had said that tremor had been going on for years.  She states today, however, that she really thinks that it started in 2012.  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  R hand was always worse even though she is L hand dominant.  It has slowly progressed.  She reports that tremor is in both hands now (R still worse) but she feels tremor on the inside of the body all of the time.  She has tremor at rest now.  She feels nervous now and is unsure if it was related.  She also has the sweats and so she was placed on xanax but that doesn't help the sweating.  She recently went to an exercise class and noted that she wasn't as coordinated/strong with the R hand and that worried her and that is why she wanted a further evaluation.    She did see [name] in June, 2013 and I reviewed that note.  She was diagnosed with essential tremor.  No treatment was recommended.  There is  family hx of tremor in her father.  She states that she has been on metoprolol since 2012 and didn't think that it helped but when she tried to get off of it a year ago, the tremor got worse and she couldn't get off of it.    03/09/14 update:  The patient presents today for follow up.  Today, she is accompanied by her husband who helps to supplement the history.  He was not present on the prior visit.  She was diagnosed with PD last visit.  I started her on Mirapex.  She has been doing better in terms of tremor.  Interestingly, sweats and sleep have been better.  She just  retired last week.  No side effects with the Mirapex.  She is currently taking it at 9 AM/2 PM/9 PM. No compulsive behaviors.  No sleep attacks.  She has been attending therapy.  She is planning on starting the Parkinson's exercise class.  She has been educating herself and reading patient education material.  She had a modified barium evaluation on 02/25/2014.  It was normal, although they suspected that she had mild esophageal dysphagia.  Her husband asks multiple questions today and asked about potentially having another opinion.  The patient has had no falls.  No hallucinations.  Current/Previously tried tremor medications: metoprololol  Current medications that may exacerbate tremor:  Cymbalta, although I reviewed that the patient did try to change to Pristiq but the patient had nightmares with Pristiq and changed back to Cymbalta  Outside reports reviewed: historical medical records, lab reports and referral letter/letters.  Allergies  Allergen Reactions  . Bactrim [Sulfamethoxazole-Trimethoprim] Other (See Comments)    Oral ulcers and rash  . Penicillins Hives    Current Outpatient Prescriptions on File Prior to Visit  Medication Sig Dispense Refill  . CRESTOR 10 MG tablet TAKE 1 TABLET BY MOUTH ON MONDAYS, WEDNESDAYS, AND FRIDAYS  30 tablet  6  .  DULoxetine (CYMBALTA) 60 MG capsule Take 1 capsule (60 mg total) by mouth daily. In am  90 capsule  3  . escitalopram (LEXAPRO) 10 MG tablet Take 1 tablet (10 mg total) by mouth daily. In pm  90 tablet  3  . Krill Oil CAPS 1 krill oil caps daily, MegaRed caps by Schiff      . metoprolol succinate (TOPROL-XL) 25 MG 24 hr tablet take 1 tablet by mouth once daily  30 tablet  3  . omeprazole (PRILOSEC) 40 MG capsule Take 1 capsule (40 mg total) by mouth daily.  30 capsule  11  . pramipexole (MIRAPEX) 0.5 MG tablet Take 1 tablet (0.5 mg total) by mouth 3 (three) times daily.  90 tablet  5  . telmisartan-hydrochlorothiazide (MICARDIS HCT) 80-25 MG  per tablet 1/2 tab po daily  90 tablet  3   No current facility-administered medications on file prior to visit.    Past Medical History  Diagnosis Date  . Fibroids   . Abnormal vaginal Pap smear   . Anemia   . GERD (gastroesophageal reflux disease)   . Hyperlipidemia   . Hypertension   . Arthritis   . Anxiety   . Obesity   . Hiatal hernia   . Esophageal stricture 1994  . Chronic UTI     sees dr Terance Hart  . Measles as a child  . Chicken pox as a child  . 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 Depression with anxiety 11/02/2009    Qualifier: Diagnosis of  By: [name], LPN, [name]   . Plantar fasciitis of left foot 06/19/2012  . Dermatitis 07/17/2012  . Urinary frequency 02/25/2013  . Acute pharyngitis 02/25/2013  . Hyperglycemia 08/19/2013    Past Surgical History  Procedure Laterality Date  . Wisdom teeth extracted    . Laporoscopy    . Tonsillectomy    . Abdominal hysterectomy  2006    total  . Partial hip arthroplasty  2010    right  . Esophageal      stretching    History   Social History  . Marital Status: Married    Spouse Name: N/A    Number of Children: N/A  . Years of Education: N/A   Occupational History  . Not on file.   Social History Main Topics  . Smoking status: Never Smoker   . Smokeless tobacco: Never Used  . Alcohol Use: 2.4 oz/week    4 Glasses of wine per week     Comment: 4 glasses of wine weekly  . Drug Use: No  . Sexual Activity: Yes    Partners: Male   Other Topics Concern  . Not on file   Social History Narrative  . No narrative on file    Family Status  Relation Status Death Age  . Mother Deceased 27    Dementia, breast cancer, bipolar  . Father Deceased 63    heart failure, rheumatoid arthritis  . Sister Alive     arthritis  . Sister Alive   . Daughter Alive     35  . Son Alive     86  . Maternal Grandmother Deceased   . Maternal Grandfather Deceased   . Paternal Grandmother Deceased   . Paternal  Grandfather Deceased   . Sister Alive      healthy  . Sister Alive     healthy  . Daughter Alive     28/ healthy    Review of Systems A complete  10 system ROS was obtained and was negative apart from what is mentioned.   Objective:   VITALS:   Filed Vitals:   03/09/14 1122  BP: 106/68  Pulse: 76  Resp: 20  Height: 5\' 5"  (1.651 m)  Weight: 225 lb (102.059 kg)   Gen:  Appears stated age and in NAD. HEENT:  Normocephalic, atraumatic. The mucous membranes are moist.   NEUROLOGICAL:  Orientation: The patient is alert and oriented x3. Fund of knowledge is appropriate.  Recent and remote memory are intact.  Attention and concentration are normal.    Able to name objects and repeat phrases. Cranial nerves: There is good facial symmetry. Pupils are equal round and reactive to light bilaterally. Fundoscopic exam reveals clear margins bilaterally.   The visual fields are full to confrontational testing. The speech is fluent and clear. Soft palate rises symmetrically and there is no tongue deviation. Hearing is intact to conversational tone. Sensation: Sensation is intact to light and pinprick throughout (facial, trunk, extremities). Vibration is intact at the bilateral big toe. There is no extinction with double simultaneous stimulation. There is no sensory dermatomal level identified. Motor: Strength is 5/5 in the bilateral upper and lower extremities.   Shoulder shrug is equal and symmetric.  There is no pronator drift.   Movement examination: Tone: There is normal tone in the right upper and right lower extremity.  Tone in the left upper and left lower extremity was normal.  Abnormal movements: There is a intermittent right upper extremity resting tremor.  There is no lower extremity tremor today.  Coordination:  There is just mild decremation with rapid alternating movements on the right, and there was good on the left today. Gait and Station: The patient has no significant difficulty  arising out of a deep-seated chair without the use of the hands. The patient's stride length is fairly normal, but there is  decreased arm swing on the right.  The patient has a negative pull test.      LABS  Lab Results  Component Value Date   TSH 1.280 04/07/2013   Lab Results  Component Value Date   WBC 5.7 04/07/2013   HGB 13.1 04/07/2013   HCT 36.5 04/07/2013   MCV 88.2 04/07/2013   PLT 300 04/07/2013     Chemistry      Component Value Date/Time   NA 141 12/29/2013 0850   K 4.7 12/29/2013 0850   CL 104 12/29/2013 0850   CO2 28 12/29/2013 0850   BUN 16 12/29/2013 0850   CREATININE 0.75 12/29/2013 0850   CREATININE 0.8 02/15/2012 0941      Component Value Date/Time   CALCIUM 9.4 12/29/2013 0850   ALKPHOS 84 04/07/2013 0923   AST 19 04/07/2013 0923   ALT 19 04/07/2013 0923   BILITOT 0.5 04/07/2013 0923          Assessment/Plan:   1. idiopathic Parkinson's disease.  The patient has tremor, bradykinesia, rigidity and mild postural instability.  -We again discussed the diagnosis as well as pathophysiology of the disease.  We discussed treatment options as well as prognostic indicators.  Patient education was provided.  -Greater than 50% of the 60 minute visit was spent in counseling answering questions and talking about what to expect now as well as in the future.  We talked about medication options as well as potential future surgical options.  We talked about safety in the home.  Patient education was provided.  She was given resources in the  community.  Patient education was provided again today, as her husband was not present last visit.  He asked about potentially being referred for another opinion.  I certainly have no objection to that.  We talked about various centers in the movement disorder experts in the area.  They will think about this and let me know if they would like a referral.  I did talk to them about the interdisciplinary clinic at [facility] if they would like a one time visit there for an  opinion.  -She will continue to pramipexole 0.5 mg tid.  She will move the dosages closer together. Risks, benefits, side effects and alternative therapies were discussed.  The opportunity to ask questions was given and they were answered to the best of my ability.  The patient expressed understanding and willingness to follow the outlined treatment protocols.  -We talked about the value of cardiovascular exercise. 2.  Depression  -This certainly could be associated with Parkinson's disease, or could be independent of it.  She is on a combination of Lexapro and Cymbalta.  Interestingly, the sweating resolved when we started Mirapex. 3.  HTN  -She is currently on 2 different antihypertensive medications, but the metoprolol has been for her "essential tremor" in the past.  I decided to go ahead and discontinue that today.  Tremor may increase, but I am not sure that she needs this.  She is to watch her blood pressure closely and make an appointment with her primary care physician. 4.  Return in about 4 months (around 07/10/2014).

## 2014-03-10 ENCOUNTER — Ambulatory Visit: Payer: BC Managed Care – PPO | Admitting: Physical Therapy

## 2014-03-10 ENCOUNTER — Ambulatory Visit: Payer: BC Managed Care – PPO | Admitting: Occupational Therapy

## 2014-03-10 ENCOUNTER — Ambulatory Visit: Payer: BC Managed Care – PPO

## 2014-03-10 DIAGNOSIS — IMO0001 Reserved for inherently not codable concepts without codable children: Secondary | ICD-10-CM | POA: Diagnosis not present

## 2014-03-11 ENCOUNTER — Ambulatory Visit: Payer: BC Managed Care – PPO | Admitting: Occupational Therapy

## 2014-03-11 DIAGNOSIS — IMO0001 Reserved for inherently not codable concepts without codable children: Secondary | ICD-10-CM | POA: Diagnosis not present

## 2014-03-12 ENCOUNTER — Telehealth: Payer: Self-pay | Admitting: Neurology

## 2014-03-12 ENCOUNTER — Ambulatory Visit: Payer: BC Managed Care – PPO | Admitting: Neurology

## 2014-03-12 NOTE — Telephone Encounter (Signed)
Requesting return call. Would like to ask Dr. Carles Collet about harvesting cord blood from a grandson expected to be born in a few days. CB# [phone] / Sherri S.

## 2014-03-12 NOTE — Telephone Encounter (Signed)
Pt returned your call at 2:20PM. Please call her back at her cell # [phone]

## 2014-03-12 NOTE — Telephone Encounter (Signed)
This is far out of my area of expertise as well.  If this is in regards to helping HER for her PD, our research is certainly not that far yet, although that is the idea behind stem cell research.

## 2014-03-12 NOTE — Telephone Encounter (Signed)
Not sure if I know how to help her. Please advise.

## 2014-03-12 NOTE — Telephone Encounter (Signed)
Left message on machine for patient to call back.

## 2014-03-12 NOTE — Telephone Encounter (Signed)
Patient made aware that you did not have a recommendation about the stem cell banking. She will call with any other questions.

## 2014-03-13 ENCOUNTER — Ambulatory Visit: Payer: BC Managed Care – PPO | Admitting: Physical Therapy

## 2014-03-13 DIAGNOSIS — IMO0001 Reserved for inherently not codable concepts without codable children: Secondary | ICD-10-CM | POA: Diagnosis not present

## 2014-03-17 ENCOUNTER — Ambulatory Visit: Payer: BC Managed Care – PPO | Admitting: Physical Therapy

## 2014-03-17 ENCOUNTER — Ambulatory Visit: Payer: BC Managed Care – PPO | Admitting: Occupational Therapy

## 2014-03-20 ENCOUNTER — Ambulatory Visit: Payer: BC Managed Care – PPO | Admitting: Physical Therapy

## 2014-03-20 ENCOUNTER — Encounter: Payer: BC Managed Care – PPO | Admitting: Occupational Therapy

## 2014-03-23 ENCOUNTER — Ambulatory Visit: Payer: BC Managed Care – PPO | Admitting: Occupational Therapy

## 2014-03-23 ENCOUNTER — Ambulatory Visit: Payer: BC Managed Care – PPO | Admitting: Physical Therapy

## 2014-03-23 DIAGNOSIS — IMO0001 Reserved for inherently not codable concepts without codable children: Secondary | ICD-10-CM | POA: Diagnosis not present

## 2014-03-24 ENCOUNTER — Ambulatory Visit: Payer: BC Managed Care – PPO | Admitting: Occupational Therapy

## 2014-03-24 DIAGNOSIS — IMO0001 Reserved for inherently not codable concepts without codable children: Secondary | ICD-10-CM | POA: Diagnosis not present

## 2014-03-26 ENCOUNTER — Encounter: Payer: BC Managed Care – PPO | Admitting: Occupational Therapy

## 2014-03-26 ENCOUNTER — Ambulatory Visit: Payer: BC Managed Care – PPO | Admitting: Physical Therapy

## 2014-03-26 DIAGNOSIS — IMO0001 Reserved for inherently not codable concepts without codable children: Secondary | ICD-10-CM | POA: Diagnosis not present

## 2014-03-31 ENCOUNTER — Encounter: Payer: BC Managed Care – PPO | Admitting: Occupational Therapy

## 2014-03-31 ENCOUNTER — Ambulatory Visit: Payer: BC Managed Care – PPO | Admitting: Physical Therapy

## 2014-04-20 ENCOUNTER — Other Ambulatory Visit: Payer: Self-pay | Admitting: Family Medicine

## 2014-05-05 ENCOUNTER — Encounter: Payer: Self-pay | Admitting: Family Medicine

## 2014-05-05 ENCOUNTER — Ambulatory Visit (INDEPENDENT_AMBULATORY_CARE_PROVIDER_SITE_OTHER): Payer: BC Managed Care – PPO | Admitting: Family Medicine

## 2014-05-05 VITALS — BP 108/70 | HR 75 | Temp 98.1°F | Ht 65.0 in | Wt 226.4 lb

## 2014-05-05 DIAGNOSIS — F341 Dysthymic disorder: Secondary | ICD-10-CM

## 2014-05-05 DIAGNOSIS — E785 Hyperlipidemia, unspecified: Secondary | ICD-10-CM | POA: Diagnosis not present

## 2014-05-05 DIAGNOSIS — K219 Gastro-esophageal reflux disease without esophagitis: Secondary | ICD-10-CM

## 2014-05-05 DIAGNOSIS — Z23 Encounter for immunization: Secondary | ICD-10-CM | POA: Diagnosis not present

## 2014-05-05 DIAGNOSIS — I10 Essential (primary) hypertension: Secondary | ICD-10-CM | POA: Diagnosis not present

## 2014-05-05 DIAGNOSIS — G20 Parkinson's disease: Secondary | ICD-10-CM

## 2014-05-05 DIAGNOSIS — E669 Obesity, unspecified: Secondary | ICD-10-CM

## 2014-05-05 DIAGNOSIS — F418 Other specified anxiety disorders: Secondary | ICD-10-CM

## 2014-05-05 DIAGNOSIS — H43399 Other vitreous opacities, unspecified eye: Secondary | ICD-10-CM

## 2014-05-05 LAB — HEPATIC FUNCTION PANEL
ALT: 19 U/L (ref 0–35)
AST: 22 U/L (ref 0–37)
Albumin: 3.9 g/dL (ref 3.5–5.2)
Alkaline Phosphatase: 65 U/L (ref 39–117)
Bilirubin, Direct: 0.1 mg/dL (ref 0.0–0.3)
Total Bilirubin: 0.8 mg/dL (ref 0.2–1.2)
Total Protein: 7.3 g/dL (ref 6.0–8.3)

## 2014-05-05 LAB — TSH: TSH: 1.58 u[IU]/mL (ref 0.35–4.50)

## 2014-05-05 LAB — RENAL FUNCTION PANEL
Albumin: 3.9 g/dL (ref 3.5–5.2)
BUN: 17 mg/dL (ref 6–23)
CO2: 28 mEq/L (ref 19–32)
Calcium: 9.6 mg/dL (ref 8.4–10.5)
Chloride: 103 mEq/L (ref 96–112)
Creatinine, Ser: 0.8 mg/dL (ref 0.4–1.2)
GFR: 75.66 mL/min (ref >60.00)
Glucose, Bld: 92 mg/dL (ref 70–99)
Phosphorus: 3.8 mg/dL (ref 2.3–4.6)
Potassium: 3.9 mEq/L (ref 3.5–5.1)
Sodium: 141 mEq/L (ref 135–145)

## 2014-05-05 LAB — LIPID PANEL
Cholesterol: 158 mg/dL (ref 0–200)
HDL: 49.9 mg/dL (ref >39.00)
LDL Cholesterol: 81 mg/dL (ref 0–99)
NonHDL: 108.1
Total CHOL/HDL Ratio: 3
Triglycerides: 136 mg/dL (ref 0.0–149.0)
VLDL: 27.2 mg/dL (ref 0.0–40.0)

## 2014-05-05 LAB — CBC
HCT: 38.8 % (ref 36.0–46.0)
Hemoglobin: 12.6 g/dL (ref 12.0–15.0)
MCHC: 32.5 g/dL (ref 30.0–36.0)
MCV: 89.2 fl (ref 78.0–100.0)
Platelets: 275 10*3/uL (ref 150.0–400.0)
RBC: 4.35 Mil/uL (ref 3.87–5.11)
RDW: 13.9 % (ref 11.5–15.5)
WBC: 6.4 10*3/uL (ref 4.0–10.5)

## 2014-05-05 MED ORDER — ESCITALOPRAM OXALATE 20 MG PO TABS: 20.0000 mg | ORAL_TABLET | Freq: Every day | ORAL | Status: DC

## 2014-05-05 MED ORDER — DULOXETINE HCL 30 MG PO CPEP: 30.0000 mg | ORAL_CAPSULE | Freq: Every day | ORAL | Status: DC

## 2014-05-05 NOTE — Assessment & Plan Note (Signed)
Encouraged DASH diet, decrease po intake and increase exercise as tolerated. Needs 7-8 hours of sleep nightly. Avoid trans fats, eat small, frequent meals every 4-5 hours with lean proteins, complex carbs and healthy fats. Minimize simple carbs 

## 2014-05-05 NOTE — Progress Notes (Signed)
Patient ID: Kristin [name], female   DOB: 05/15/1951, 63 y.o.   MRN: [id number] Kristin [name] [id number] 09-18-1950 05/05/2014      Progress Note-Follow Up  Subjective  Chief Complaint  Chief Complaint  Patient presents with  . Follow-up    4 month  . Injections    flu    HPI  Patient is a 63 year old female in today for routine medical care. In today for followup. Continues to struggle with anxiety and depression around her diagnosis of Parkinson's disease is happy with her care but considering a secondary opinion mostly to reassure her family. Is exercising and dissipating and physical therapy to manage the physical symptoms and doing better. Her pain is actually improved her strength is better although she does note some discomfort in her right thigh intermittently.. Technologist and anhedonia and difficulty concentrating. Is having persistent episodes of sweating. Has recently had some mild sore throat head congestion and diarrhea but that is improving. Denies headache or chest congestion. Denies fevers or chills. Denies CP/palp/SOB/HA/congestion/fevers/GI or GU c/o. Taking meds as prescribed  Past Medical History  Diagnosis Date  . Fibroids   . Abnormal vaginal Pap smear   . Anemia   . GERD (gastroesophageal reflux disease)   . Hyperlipidemia   . Hypertension   . Arthritis   . Anxiety   . Obesity   . Hiatal hernia   . Esophageal stricture 1994  . Chronic UTI     sees dr Terance Hart  . Measles as a child  . Chicken pox as a child  . 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 Depression with anxiety 11/02/2009    Qualifier: Diagnosis of  By: [name], LPN, [name]   . Plantar fasciitis of left foot 06/19/2012  . Dermatitis 07/17/2012  . Urinary frequency 02/25/2013  . Acute pharyngitis 02/25/2013  . Hyperglycemia 08/19/2013    Past Surgical History  Procedure Laterality Date  . Wisdom teeth extracted    . Laporoscopy    . Tonsillectomy    . Abdominal hysterectomy  2006    total   . Partial hip arthroplasty  2010    right  . Esophageal      stretching    Family History  Problem Relation Age of Onset  . Cancer Mother     breast  . Other Mother     arrythmia  . Mental illness Mother     bipolar  . Heart disease Father   . Arthritis Father     rheumatoid  . Depression Sister   . Arthritis Sister   . Colon cancer Neg Hx   . Esophageal cancer Neg Hx   . Rectal cancer Neg Hx   . Stomach cancer Neg Hx     History   Social History  . Marital Status: Married    Spouse Name: N/A    Number of Children: N/A  . Years of Education: N/A   Occupational History  . Not on file.   Social History Main Topics  . Smoking status: Never Smoker   . Smokeless tobacco: Never Used  . Alcohol Use: 2.4 oz/week    4 Glasses of wine per week     Comment: 4 glasses of wine weekly  . Drug Use: No  . Sexual Activity: Yes    Partners: Male   Other Topics Concern  . Not on file   Social History Narrative  . No narrative on file    Current Outpatient Prescriptions on File Prior  to Visit  Medication Sig Dispense Refill  . ALPRAZolam (XANAX) 0.5 MG tablet Take 0.5 mg by mouth as needed for anxiety or sleep.      . CRESTOR 10 MG tablet TAKE 1 TABLET BY MOUTH ON MONDAYS, WEDNESDAYS, AND FRIDAYS  30 tablet  6  . DULoxetine (CYMBALTA) 60 MG capsule Take 1 capsule (60 mg total) by mouth daily. In am  90 capsule  3  . escitalopram (LEXAPRO) 10 MG tablet Take 1 tablet (10 mg total) by mouth daily. In pm  90 tablet  3  . Krill Oil CAPS 1 krill oil caps daily, MegaRed caps by Schiff      . metoprolol succinate (TOPROL-XL) 25 MG 24 hr tablet take 1 tablet by mouth once daily  30 tablet  3  . omeprazole (PRILOSEC) 40 MG capsule take 1 capsule by mouth daily  30 capsule  11  . pramipexole (MIRAPEX) 0.5 MG tablet Take 1 tablet (0.5 mg total) by mouth 3 (three) times daily.  90 tablet  5  . telmisartan-hydrochlorothiazide (MICARDIS HCT) 80-25 MG per tablet 1/2 tab po daily  90  tablet  3   No current facility-administered medications on file prior to visit.    Allergies  Allergen Reactions  . Bactrim [Sulfamethoxazole-Trimethoprim] Other (See Comments)    Oral ulcers and rash  . Penicillins Hives    Review of Systems  Review of Systems  Constitutional: Negative for fever and malaise/fatigue.  HENT: Negative for congestion.   Eyes: Negative for discharge.  Respiratory: Negative for shortness of breath.   Cardiovascular: Negative for chest pain, palpitations and leg swelling.  Gastrointestinal: Negative for nausea, abdominal pain and diarrhea.  Genitourinary: Negative for dysuria.  Musculoskeletal: Negative for falls.  Skin: Negative for rash.  Neurological: Positive for tremors. Negative for loss of consciousness and headaches.  Endo/Heme/Allergies: Negative for polydipsia.  Psychiatric/Behavioral: Positive for depression. Negative for suicidal ideas. The patient is nervous/anxious. The patient does not have insomnia.     Objective  BP 108/70  Pulse 75  Temp(Src) 98.1 F (36.7 C) (Oral)  Ht 5\' 5"  (1.651 m)  Wt 226 lb 6.4 oz (102.694 kg)  BMI 37.67 kg/m2  SpO2 95%  Physical Exam  Physical Exam  Constitutional: She is oriented to person, place, and time and well-developed, well-nourished, and in no distress. No distress.  HENT:  Head: Normocephalic and atraumatic.  Eyes: Conjunctivae are normal.  Neck: Neck supple. No thyromegaly present.  Cardiovascular: Normal rate, regular rhythm and normal heart sounds.   No murmur heard. Pulmonary/Chest: Effort normal and breath sounds normal. She has no wheezes.  Abdominal: She exhibits no distension and no mass.  Musculoskeletal: She exhibits no edema.  Lymphadenopathy:    She has no cervical adenopathy.  Neurological: She is alert and oriented to person, place, and time. She exhibits abnormal muscle tone.  Skin: Skin is warm and dry. No rash noted. She is not diaphoretic.  Psychiatric: Memory  and judgment normal.  Mildly flattened affect.    Lab Results  Component Value Date   TSH 1.280 04/07/2013   Lab Results  Component Value Date   WBC 5.7 04/07/2013   HGB 13.1 04/07/2013   HCT 36.5 04/07/2013   MCV 88.2 04/07/2013   PLT 300 04/07/2013   Lab Results  Component Value Date   CREATININE 0.75 12/29/2013   BUN 16 12/29/2013   NA 141 12/29/2013   K 4.7 12/29/2013   CL 104 12/29/2013   CO2 28 12/29/2013  Lab Results  Component Value Date   ALT 19 04/07/2013   AST 19 04/07/2013   ALKPHOS 84 04/07/2013   BILITOT 0.5 04/07/2013   Lab Results  Component Value Date   CHOL 190 04/07/2013   Lab Results  Component Value Date   HDL 51 04/07/2013   Lab Results  Component Value Date   LDLCALC 109* 04/07/2013   Lab Results  Component Value Date   TRIG 148 04/07/2013   Lab Results  Component Value Date   CHOLHDL 3.7 04/07/2013     Assessment & Plan  OBESITY, UNSPECIFIED Encouraged DASH diet, decrease po intake and increase exercise as tolerated. Needs 7-8 hours of sleep nightly. Avoid trans fats, eat small, frequent meals every 4-5 hours with lean proteins, complex carbs and healthy fats. Minimize simple carbs.   GERD Avoid offending foods, start probiotics. Do not eat large meals in late evening and consider raising head of bed.   Depression with anxiety Struggling with her depression around the Parkinson's diagnosis. Is staying active and managing the physical aspects well but sad and overwhelmed at times. Is given rx for increased Lexapro and decreased Cymbalta, trying to balance symptoms control with side effects.   Parkinson's disease Following closely with neurology and happy with care but considering secondary opinion just for completeness, reassurance and family concerns. Will discuss with neurology. Is referred to dermatology for h/o sun damage and new disease state  HYPERTENSION Denies CP/palp/SOB/HA/congestion/fevers/GI or GU c/o. Taking meds as prescribed. Will stop her  Metoprolol and monitor  HYPERLIPIDEMIA Tolerating statin, encouraged heart healthy diet, avoid trans fats, minimize simple carbs and saturated fats. Increase exercise as tolerated  Visual floaters Following with opthamology

## 2014-05-05 NOTE — Progress Notes (Signed)
Pre visit review using our clinic review tool, if applicable. No additional management support is needed unless otherwise documented below in the visit note. 

## 2014-05-05 NOTE — Patient Instructions (Signed)
Zinc for virus such as coldeeze, xicam, Mucinex twice daily, increase hydration [employer] Program[employer], systems, [address] ([place]  Upper Respiratory Infection, Adult An upper respiratory infection (URI) is also known as the common cold. It is often caused by a type of germ (virus). Colds are easily spread (contagious). You can pass it to others by kissing, coughing, sneezing, or drinking out of the same glass. Usually, you get better in 1 or 2 weeks.  HOME CARE   Only take medicine as told by your doctor.  Use a warm mist humidifier or breathe in steam from a hot shower.  Drink enough water and fluids to keep your pee (urine) clear or pale yellow.  Get plenty of rest.  Return to work when your temperature is back to normal or as told by your doctor. You may use a face mask and wash your hands to stop your cold from spreading. GET HELP RIGHT AWAY IF:   After the first few days, you feel you are getting worse.  You have questions about your medicine.  You have chills, shortness of breath, or brown or red spit (mucus).  You have yellow or brown snot (nasal discharge) or pain in the face, especially when you bend forward.  You have a fever, puffy (swollen) neck, pain when you swallow, or white spots in the back of your throat.  You have a bad headache, ear pain, sinus pain, or chest pain.  You have a high-pitched whistling sound when you breathe in and out (wheezing).  You have a lasting cough or cough up blood.  You have sore muscles or a stiff neck. MAKE SURE YOU:   Understand these instructions.  Will watch your condition.  Will get help right away if you are not doing well or get worse. Document Released: 02/07/2008 Document Revised: 11/13/2011 Document Reviewed: 11/26/2013 [facility] Patient Information 2015 [place], [place]. This information is not intended to replace advice given to you by your health care provider. Make sure you discuss any  questions you have with your health care provider.

## 2014-05-08 ENCOUNTER — Telehealth: Payer: Self-pay

## 2014-05-08 NOTE — Telephone Encounter (Signed)
PA for Omeprazole filled out and put on mds desk to sign and fax

## 2014-05-11 ENCOUNTER — Encounter: Payer: Self-pay | Admitting: Family Medicine

## 2014-05-11 DIAGNOSIS — G20 Parkinson's disease: Secondary | ICD-10-CM | POA: Insufficient documentation

## 2014-05-11 DIAGNOSIS — H43399 Other vitreous opacities, unspecified eye: Secondary | ICD-10-CM | POA: Insufficient documentation

## 2014-05-11 HISTORY — DX: Other vitreous opacities, unspecified eye: H43.399

## 2014-05-11 NOTE — Assessment & Plan Note (Signed)
Avoid offending foods, start probiotics. Do not eat large meals in late evening and consider raising head of bed.  

## 2014-05-11 NOTE — Assessment & Plan Note (Signed)
Denies CP/palp/SOB/HA/congestion/fevers/GI or GU c/o. Taking meds as prescribed. Will stop her Metoprolol and monitor

## 2014-05-11 NOTE — Assessment & Plan Note (Signed)
Struggling with her depression around the Parkinson's diagnosis. Is staying active and managing the physical aspects well but sad and overwhelmed at times. Is given rx for increased Lexapro and decreased Cymbalta, trying to balance symptoms control with side effects.

## 2014-05-11 NOTE — Assessment & Plan Note (Signed)
Following with opthamology

## 2014-05-11 NOTE — Assessment & Plan Note (Signed)
Following closely with neurology and happy with care but considering secondary opinion just for completeness, reassurance and family concerns. Will discuss with neurology. Is referred to dermatology for h/o sun damage and new disease state

## 2014-05-11 NOTE — Assessment & Plan Note (Signed)
Tolerating statin, encouraged heart healthy diet, avoid trans fats, minimize simple carbs and saturated fats. Increase exercise as tolerated 

## 2014-05-12 NOTE — Telephone Encounter (Signed)
Pa was faxed on 05-12-14

## 2014-05-25 NOTE — Telephone Encounter (Signed)
Patient has been taking omeprazole since 2012 (maybe longer- that's just how far back our records go)..  refaxed paperwork

## 2014-05-27 NOTE — Telephone Encounter (Signed)
Debra from [employer] called. Best # [phone]  More information is needed for criteria questions to be able to complete authorization.

## 2014-05-28 NOTE — Telephone Encounter (Signed)
Omeprazole approved 04-28-14 through 05-28-15

## 2014-06-16 ENCOUNTER — Other Ambulatory Visit: Payer: Self-pay

## 2014-06-16 DIAGNOSIS — Z1231 Encounter for screening mammogram for malignant neoplasm of breast: Secondary | ICD-10-CM

## 2014-06-19 ENCOUNTER — Other Ambulatory Visit: Payer: Self-pay

## 2014-07-01 ENCOUNTER — Ambulatory Visit
Admission: RE | Admit: 2014-07-01 | Discharge: 2014-07-01 | Disposition: A | Discharge status: Home / Self Care | Payer: BC Managed Care – PPO | Source: Ambulatory Visit

## 2014-07-01 DIAGNOSIS — Z1231 Encounter for screening mammogram for malignant neoplasm of breast: Secondary | ICD-10-CM

## 2014-07-06 ENCOUNTER — Telehealth: Payer: Self-pay | Admitting: Neurology

## 2014-07-06 ENCOUNTER — Encounter: Payer: Self-pay | Admitting: Family Medicine

## 2014-07-06 ENCOUNTER — Ambulatory Visit (INDEPENDENT_AMBULATORY_CARE_PROVIDER_SITE_OTHER): Payer: BC Managed Care – PPO | Admitting: Family Medicine

## 2014-07-06 VITALS — BP 100/69 | HR 79 | Temp 98.7°F | Ht 65.0 in | Wt 230.2 lb

## 2014-07-06 DIAGNOSIS — D649 Anemia, unspecified: Secondary | ICD-10-CM

## 2014-07-06 DIAGNOSIS — G20 Parkinson's disease: Secondary | ICD-10-CM

## 2014-07-06 DIAGNOSIS — M79671 Pain in right foot: Secondary | ICD-10-CM

## 2014-07-06 DIAGNOSIS — K219 Gastro-esophageal reflux disease without esophagitis: Secondary | ICD-10-CM

## 2014-07-06 DIAGNOSIS — E785 Hyperlipidemia, unspecified: Secondary | ICD-10-CM

## 2014-07-06 DIAGNOSIS — F418 Other specified anxiety disorders: Secondary | ICD-10-CM

## 2014-07-06 DIAGNOSIS — M79672 Pain in left foot: Secondary | ICD-10-CM

## 2014-07-06 DIAGNOSIS — I10 Essential (primary) hypertension: Secondary | ICD-10-CM

## 2014-07-06 DIAGNOSIS — E669 Obesity, unspecified: Secondary | ICD-10-CM

## 2014-07-06 DIAGNOSIS — G20A1 Parkinson's disease without dyskinesia, without mention of fluctuations: Secondary | ICD-10-CM

## 2014-07-06 NOTE — Telephone Encounter (Signed)
Pt resch appt to see Dr tat on 07-15-14

## 2014-07-06 NOTE — Assessment & Plan Note (Signed)
resolved 

## 2014-07-06 NOTE — Assessment & Plan Note (Signed)
Doing well on current meds. Is managing her grief around her Parkinson's diagnosis better.

## 2014-07-06 NOTE — Assessment & Plan Note (Signed)
At base of toes b/l likely related to change ingait, try ice and Salon pas gel bid

## 2014-07-06 NOTE — Progress Notes (Signed)
Pre visit review using our clinic review tool, if applicable. No additional management support is needed unless otherwise documented below in the visit note. 

## 2014-07-06 NOTE — Assessment & Plan Note (Signed)
Tolerating statin, encouraged heart healthy diet, avoid trans fats, minimize simple carbs and saturated fats. Increase exercise as tolerated 

## 2014-07-06 NOTE — Assessment & Plan Note (Signed)
Encouraged DASH diet, decrease po intake and increase exercise as tolerated. Needs 7-8 hours of sleep nightly. Avoid trans fats, eat small, frequent meals every 4-5 hours with lean proteins, complex carbs and healthy fats. Minimize simple carbs, GMO foods. 

## 2014-07-06 NOTE — Assessment & Plan Note (Signed)
Avoid offending foods, start probiotics. Do not eat large meals in late evening and consider raising head of bed. Has rx for Omeprazole 40 mg daily but is now but did well on 20 mg qd, will try 40 mg qod.

## 2014-07-06 NOTE — Assessment & Plan Note (Signed)
Patient is following with neurology and is pleased with her care but her family is pushing her to get a second opinion at [facility], she will discuss with neurology when she sees them in next month

## 2014-07-06 NOTE — Assessment & Plan Note (Signed)
Denies CP/palp/SOB/HA/congestion/fevers/GI or GU c/o. Taking meds as prescribed 

## 2014-07-12 NOTE — Progress Notes (Signed)
Patient ID: Kristin [name], female   DOB: [birth year], 63 y.o.   MRN: [id number] BRADYN VASSEY [id number] March 14, 1951 07/12/2014      Progress Note-Follow Up  Subjective  Chief Complaint  Chief Complaint  Patient presents with  . Follow-up    8 week    HPI  Patient is a 63 year old female in today for routine medical care. In today for follow up is still struggling with her Parkinson's diagnosis and her family is pushing hard to have her seek a second opinion. No significant worsening of her symptoms. Is having no reflux on her Omeprazole. She is noting her mother had breast cancer at 41, her M Aunt at 70 and her M uncle had breast cancer in his late 32s. Her new complaint is her feet hurt daily. No injury or swelling. Denies CP/palp/SOB/HA/congestion/fevers/GI or GU c/o. Taking meds as prescribed  Past Medical History  Diagnosis Date  . Fibroids   . Abnormal vaginal Pap smear   . Anemia   . GERD (gastroesophageal reflux disease)   . Hyperlipidemia   . Hypertension   . Arthritis   . Anxiety   . Obesity   . Hiatal hernia   . Esophageal stricture 1994  . Chronic UTI     sees dr Terance Hart  . Measles as a child  . Chicken pox as a child  . 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 Depression with anxiety 11/02/2009    Qualifier: Diagnosis of  By: [name], LPN, [name]   . Plantar fasciitis of left foot 06/19/2012  . Dermatitis 07/17/2012  . Urinary frequency 02/25/2013  . Acute pharyngitis 02/25/2013  . Hyperglycemia 08/19/2013  . Visual floaters 05/11/2014  . Foot pain, bilateral 06/19/2012    Past Surgical History  Procedure Laterality Date  . Wisdom teeth extracted    . Laporoscopy    . Tonsillectomy    . Abdominal hysterectomy  2006    total  . Partial hip arthroplasty  2010    right  . Esophageal      stretching    Family History  Problem Relation Age of Onset  . Cancer Mother     breast  . Other Mother     arrythmia  . Mental illness Mother     bipolar  . Heart  disease Father   . Arthritis Father     rheumatoid  . Depression Sister   . Arthritis Sister   . Colon cancer Neg Hx   . Esophageal cancer Neg Hx   . Rectal cancer Neg Hx   . Stomach cancer Neg Hx     History   Social History  . Marital Status: Married    Spouse Name: N/A    Number of Children: N/A  . Years of Education: N/A   Occupational History  . Not on file.   Social History Main Topics  . Smoking status: Never Smoker   . Smokeless tobacco: Never Used  . Alcohol Use: 2.4 oz/week    4 Glasses of wine per week     Comment: 4 glasses of wine weekly  . Drug Use: No  . Sexual Activity:    Partners: Male   Other Topics Concern  . Not on file   Social History Narrative    Current Outpatient Prescriptions on File Prior to Visit  Medication Sig Dispense Refill  . ALPRAZolam (XANAX) 0.5 MG tablet Take 0.5 mg by mouth as needed for anxiety or sleep.    . bisacodyl (  DULCOLAX) 5 MG EC tablet Take 5 mg by mouth daily as needed for moderate constipation.    . CRESTOR 10 MG tablet TAKE 1 TABLET BY MOUTH ON MONDAYS, WEDNESDAYS, AND FRIDAYS 30 tablet 6  . DULoxetine (CYMBALTA) 30 MG capsule Take 1 capsule (30 mg total) by mouth daily. 30 capsule 3  . escitalopram (LEXAPRO) 20 MG tablet Take 1 tablet (20 mg total) by mouth daily. 30 tablet 3  . Krill Oil CAPS 1 krill oil caps daily, MegaRed caps by Schiff    . omeprazole (PRILOSEC) 40 MG capsule take 1 capsule by mouth daily 30 capsule 11  . pramipexole (MIRAPEX) 0.5 MG tablet Take 1 tablet (0.5 mg total) by mouth 3 (three) times daily. 90 tablet 5  . telmisartan-hydrochlorothiazide (MICARDIS HCT) 80-25 MG per tablet 1/2 tab po daily 90 tablet 3   No current facility-administered medications on file prior to visit.    Allergies  Allergen Reactions  . Bactrim [Sulfamethoxazole-Trimethoprim] Other (See Comments)    Oral ulcers and rash  . Penicillins Hives    Review of Systems  Review of Systems  Constitutional:  Positive for malaise/fatigue. Negative for fever.  HENT: Negative for congestion.   Eyes: Negative for discharge.  Respiratory: Negative for shortness of breath.   Cardiovascular: Negative for chest pain, palpitations and leg swelling.  Gastrointestinal: Negative for nausea, abdominal pain and diarrhea.  Genitourinary: Negative for dysuria.  Musculoskeletal: Negative for falls.  Skin: Negative for rash.  Neurological: Positive for tremors. Negative for loss of consciousness and headaches.  Endo/Heme/Allergies: Negative for polydipsia.  Psychiatric/Behavioral: Negative for depression and suicidal ideas. The patient is not nervous/anxious and does not have insomnia.     Objective  BP 100/69 mmHg  Pulse 79  Temp(Src) 98.7 F (37.1 C) (Oral)  Ht 5\' 5"  (1.651 m)  Wt 230 lb 3.2 oz (104.418 kg)  BMI 38.31 kg/m2  SpO2 97%  Physical Exam  Physical Exam  Constitutional: She is oriented to person, place, and time and well-developed, well-nourished, and in no distress. No distress.  HENT:  Head: Normocephalic and atraumatic.  Eyes: Conjunctivae are normal.  Neck: Neck supple. No thyromegaly present.  Cardiovascular: Normal rate, regular rhythm and normal heart sounds.   No murmur heard. Pulmonary/Chest: Effort normal and breath sounds normal. She has no wheezes.  Abdominal: She exhibits no distension and no mass.  Musculoskeletal: She exhibits no edema.  Lymphadenopathy:    She has no cervical adenopathy.  Neurological: She is alert and oriented to person, place, and time.  Skin: Skin is warm and dry. No rash noted. She is not diaphoretic.  Psychiatric: Memory, affect and judgment normal.    Lab Results  Component Value Date   TSH 1.58 05/05/2014   Lab Results  Component Value Date   WBC 6.4 05/05/2014   HGB 12.6 05/05/2014   HCT 38.8 05/05/2014   MCV 89.2 05/05/2014   PLT 275.0 05/05/2014   Lab Results  Component Value Date   CREATININE 0.8 05/05/2014   BUN 17  05/05/2014   NA 141 05/05/2014   K 3.9 05/05/2014   CL 103 05/05/2014   CO2 28 05/05/2014   Lab Results  Component Value Date   ALT 19 05/05/2014   AST 22 05/05/2014   ALKPHOS 65 05/05/2014   BILITOT 0.8 05/05/2014   Lab Results  Component Value Date   CHOL 158 05/05/2014   Lab Results  Component Value Date   HDL 49.90 05/05/2014   Lab Results  Component Value Date   LDLCALC 81 05/05/2014   Lab Results  Component Value Date   TRIG 136.0 05/05/2014   Lab Results  Component Value Date   CHOLHDL 3 05/05/2014     Assessment & Plan  Essential hypertension Denies CP/palp/SOB/HA/congestion/fevers/GI or GU c/o. Taking meds as prescribed  Obesity Encouraged DASH diet, decrease po intake and increase exercise as tolerated. Needs 7-8 hours of sleep nightly. Avoid trans fats, eat small, frequent meals every 4-5 hours with lean proteins, complex carbs and healthy fats. Minimize simple carbs, GMO foods.  GERD Avoid offending foods, start probiotics. Do not eat large meals in late evening and consider raising head of bed. Has rx for Omeprazole 40 mg daily but is now but did well on 20 mg qd, will try 40 mg qod.   Parkinson's disease Patient is following with neurology and is pleased with her care but her family is pushing her to get a second opinion at [facility], she will discuss with neurology when she sees them in next month  Foot pain, bilateral At base of toes b/l likely related to change ingait, try ice and Salon pas gel bid  Depression with anxiety Doing well on current meds. Is managing her grief around her Parkinson's diagnosis better.  Hyperlipidemia Tolerating statin, encouraged heart healthy diet, avoid trans fats, minimize simple carbs and saturated fats. Increase exercise as tolerated  Anemia resolved

## 2014-07-14 ENCOUNTER — Ambulatory Visit: Payer: BC Managed Care – PPO | Admitting: Neurology

## 2014-07-15 ENCOUNTER — Encounter: Payer: Self-pay | Admitting: Neurology

## 2014-07-15 ENCOUNTER — Ambulatory Visit (INDEPENDENT_AMBULATORY_CARE_PROVIDER_SITE_OTHER): Payer: BC Managed Care – PPO | Admitting: Neurology

## 2014-07-15 VITALS — BP 105/60 | HR 82 | Ht 65.0 in | Wt 229.6 lb

## 2014-07-15 DIAGNOSIS — G5730 Lesion of lateral popliteal nerve, unspecified lower limb: Secondary | ICD-10-CM

## 2014-07-15 DIAGNOSIS — F418 Other specified anxiety disorders: Secondary | ICD-10-CM

## 2014-07-15 DIAGNOSIS — G20 Parkinson's disease: Secondary | ICD-10-CM

## 2014-07-15 MED ORDER — PRAMIPEXOLE DIHYDROCHLORIDE 0.5 MG PO TABS: 0.5000 mg | ORAL_TABLET | Freq: Three times a day (TID) | ORAL | Status: DC

## 2014-07-15 NOTE — Progress Notes (Signed)
Subjective:    Kristin [name] was seen in consultation in the movement disorder clinic at the request of [name], MD.  The evaluation is for tremor.  The patient is a 63 y.o. left handed female with a history of tremor.  The records that were made available to me were reviewed.  According to records, the patient has complained about tremor to her previous primary care physician all the way back to 2012, but in those records she had said that tremor had been going on for years.  She states today, however, that she really thinks that it started in 2012.  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  R hand was always worse even though she is L hand dominant.  It has slowly progressed.  She reports that tremor is in both hands now (R still worse) but she feels tremor on the inside of the body all of the time.  She has tremor at rest now.  She feels nervous now and is unsure if it was related.  She also has the sweats and so she was placed on xanax but that doesn't help the sweating.  She recently went to an exercise class and noted that she wasn't as coordinated/strong with the R hand and that worried her and that is why she wanted a further evaluation.    She did see [name] in June, 2013 and I reviewed that note.  She was diagnosed with essential tremor.  No treatment was recommended.  There is  family hx of tremor in her father.  She states that she has been on metoprolol since 2012 and didn't think that it helped but when she tried to get off of it a year ago, the tremor got worse and she couldn't get off of it.    03/09/14 update:  The patient presents today for follow up.  Today, she is accompanied by her husband who helps to supplement the history.  He was not present on the prior visit.  She was diagnosed with PD last visit.  I started her on Mirapex.  She has been doing better in terms of tremor.  Interestingly, sweats and sleep have been better.  She just  retired last week.  No side effects with the Mirapex.  She is currently taking it at 9 AM/2 PM/9 PM. No compulsive behaviors.  No sleep attacks.  She has been attending therapy.  She is planning on starting the Parkinson's exercise class.  She has been educating herself and reading patient education material.  She had a modified barium evaluation on 02/25/2014.  It was normal, although they suspected that she had mild esophageal dysphagia.  Her husband asks multiple questions today and asked about potentially having another opinion.  The patient has had no falls.  No hallucinations.  07/15/14 update:  Pt is f/u today, accompanied by her husband who supplements the history.  Pt is on mirapex 0.5 mg three times per day.  I stopped her metoprolol last visit. She did fine with that. She is exercising with the Moves class and with circuit II class.  She is also enrolled in the bike class at the [facility].  She is having some soreness because of the exercise and hip pain because of it and she has had a hip replacement and worries about that as she doesn't want to have another.  Thinking about trying chiropractics for that.  She asks me about some.  Stages that she has on the lateral aspect of the knees.  She does not wear boots.  She has not had a knee replacement.  She also complains of some achiness in her toes and it gets better as she walks.  No falls.  No lightheadedness.    Current/Previously tried tremor medications: metoprololol  Current medications that may exacerbate tremor:  Cymbalta, although I reviewed that the patient did try to change to Pristiq but the patient had nightmares with Pristiq and changed back to Cymbalta  Outside reports reviewed: historical medical records, lab reports and referral letter/letters.  Allergies  Allergen Reactions  . Bactrim [Sulfamethoxazole-Trimethoprim] Other (See Comments)    Oral ulcers and rash  . Penicillins Hives    Current Outpatient Prescriptions on File  Prior to Visit  Medication Sig Dispense Refill  . ALPRAZolam (XANAX) 0.5 MG tablet Take 0.5 mg by mouth as needed for anxiety or sleep.    . bisacodyl (DULCOLAX) 5 MG EC tablet Take 5 mg by mouth daily as needed for moderate constipation.    . CRESTOR 10 MG tablet TAKE 1 TABLET BY MOUTH ON MONDAYS, WEDNESDAYS, AND FRIDAYS 30 tablet 6  . DULoxetine (CYMBALTA) 30 MG capsule Take 1 capsule (30 mg total) by mouth daily. 30 capsule 3  . escitalopram (LEXAPRO) 20 MG tablet Take 1 tablet (20 mg total) by mouth daily. 30 tablet 3  . Krill Oil CAPS 1 krill oil caps daily, MegaRed caps by Schiff    . omeprazole (PRILOSEC) 40 MG capsule take 1 capsule by mouth daily 30 capsule 11  . telmisartan-hydrochlorothiazide (MICARDIS HCT) 80-25 MG per tablet 1/2 tab po daily 90 tablet 3   No current facility-administered medications on file prior to visit.    Past Medical History  Diagnosis Date  . Fibroids   . Abnormal vaginal Pap smear   . Anemia   . GERD (gastroesophageal reflux disease)   . Hyperlipidemia   . Hypertension   . Arthritis   . Anxiety   . Obesity   . Hiatal hernia   . Esophageal stricture 1994  . Chronic UTI     sees dr Terance Hart  . Measles as a child  . Chicken pox as a child  . 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 Depression with anxiety 11/02/2009    Qualifier: Diagnosis of  By: [name], LPN, [name]   . Plantar fasciitis of left foot 06/19/2012  . Dermatitis 07/17/2012  . Urinary frequency 02/25/2013  . Acute pharyngitis 02/25/2013  . Hyperglycemia 08/19/2013  . Visual floaters 05/11/2014  . Foot pain, bilateral 06/19/2012    Past Surgical History  Procedure Laterality Date  . Wisdom teeth extracted    . Laporoscopy    . Tonsillectomy    . Abdominal hysterectomy  2006    total  . Partial hip arthroplasty  2010    right  . Esophageal      stretching    History   Social History  . Marital Status: Married    Spouse Name: N/A    Number of Children: N/A  . Years of  Education: N/A   Occupational History  . Not on file.   Social History Main Topics  . Smoking status: Never Smoker   . Smokeless tobacco: Never Used  . Alcohol Use: 2.4 oz/week    4 Glasses of wine per week     Comment: 4 glasses of wine weekly  . Drug Use: No  . Sexual Activity:    Partners:  Male   Other Topics Concern  . Not on file   Social History Narrative    Family Status  Relation Status Death Age  . Mother Deceased 64    Dementia, breast cancer, bipolar  . Father Deceased 30    heart failure, rheumatoid arthritis  . Sister Alive     arthritis  . Sister Alive   . Daughter Alive     42  . Son Alive     46  . Maternal Grandmother Deceased   . Maternal Grandfather Deceased   . Paternal Grandmother Deceased   . Paternal Grandfather Deceased   . Sister Alive      healthy  . Sister Alive     healthy  . Daughter Alive     28/ healthy    Review of Systems A complete 10 system ROS was obtained and was negative apart from what is mentioned.   Objective:   VITALS:   Filed Vitals:   07/15/14 0936  BP: 105/60  Pulse: 82  Height: 5\' 5"  (1.651 m)  Weight: 229 lb 9.6 oz (104.146 kg)  SpO2: 97%   Gen:  Appears stated age and in NAD. HEENT:  Normocephalic, atraumatic. The mucous membranes are moist.  Cardiovascular: Regular rate and rhythm Lungs: Clear to auscultation bilaterally Neck: No carotid bruits  NEUROLOGICAL:  Orientation: The patient is alert and oriented x3. Fund of knowledge is appropriate.  Recent and remote memory are intact.  Attention and concentration are normal.    Able to name objects and repeat phrases. Cranial nerves: There is good facial symmetry. The visual fields are full to confrontational testing. The speech is fluent and clear. Soft palate rises symmetrically and there is no tongue deviation. Hearing is intact to conversational tone. Sensation: Sensation is intact to light touch throughout. Motor: Strength is 5/5 in the bilateral  upper and lower extremities.   Shoulder shrug is equal and symmetric.  There is no pronator drift.   Movement examination: Tone: There is normal tone in the right upper and right lower extremity.  Tone in the left upper and left lower extremity was normal.  Abnormal movements: There is a intermittent right upper extremity resting tremor.  There isalso an intermittent right lower extremity resting tremor. Coordination:  There is no significant decremation with rapid alternating movements in the upper or lower extremities. Gait and Station: The patient has no significant difficulty arising out of a deep-seated chair without the use of the hands. The patient's stride length is fairly normal, but there is  decreased arm swing on the right.  The patient has a negative pull test.      LABS  Lab Results  Component Value Date   TSH 1.58 05/05/2014   Lab Results  Component Value Date   WBC 6.4 05/05/2014   HGB 12.6 05/05/2014   HCT 38.8 05/05/2014   MCV 89.2 05/05/2014   PLT 275.0 05/05/2014     Chemistry      Component Value Date/Time   NA 141 05/05/2014 0948   K 3.9 05/05/2014 0948   CL 103 05/05/2014 0948   CO2 28 05/05/2014 0948   BUN 17 05/05/2014 0948   CREATININE 0.8 05/05/2014 0948   CREATININE 0.75 12/29/2013 0850      Component Value Date/Time   CALCIUM 9.6 05/05/2014 0948   ALKPHOS 65 05/05/2014 0948   AST 22 05/05/2014 0948   ALT 19 05/05/2014 0948   BILITOT 0.8 05/05/2014 0948  Assessment/Plan:   1. idiopathic Parkinson's disease.  The patient has tremor, bradykinesia, rigidity and mild postural instability.  -We again discussed the diagnosis as well as pathophysiology of the disease.  We discussed treatment options as well as prognostic indicators.  Patient education was provided.  -Greater than 50% of the 40 minute visit was spent in counseling answering questions and talking about what to expect now as well as in the future.  She asked me multiple  questions today, as did her husband, and I answered them to the best of my ability.  Patient education was provided.  The asked about DBS and its role in the treatment of Parkinson's disease.  We discussed this today.  -She will continue on pramipexole 0.5 mg tid.  . Risks, benefits, side effects and alternative therapies were discussed.  The opportunity to ask questions was given and they were answered to the best of my ability.  The patient expressed understanding and willingness to follow the outlined treatment protocols.  -I encouraged her to continue the cardiovascular exercise.  She is really doing a great job in this regard.  -patient was worried about Lewy Body dementia.  I reassured her again that she does not have this.  Apparently her mother had this.  -the patient would like a second opinion at [facility].  I will refer her. 2.  Depression  -This certainly could be associated with Parkinson's disease, or could be independent of it.  She is on a combination of Lexapro and Cymbalta.  Interestingly, the sweating resolved when we started Mirapex. 3.  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  In the end, we decided to hold on that. 4.  Return in about 3 months (around 10/15/2014).

## 2014-07-16 ENCOUNTER — Telehealth: Payer: Self-pay | Admitting: Neurology

## 2014-07-16 DIAGNOSIS — G20 Parkinson's disease: Secondary | ICD-10-CM

## 2014-07-16 NOTE — Telephone Encounter (Signed)
Referral faxed to [facility] at [phone] with confirmation received. They will contact patient directly with appt.

## 2014-07-16 NOTE — Telephone Encounter (Signed)
-----   Message from Laona, DO sent at 07/15/2014 11:17 AM EST ----- Please refer pt to [facility] for second opinion re: PD at her request

## 2014-07-21 ENCOUNTER — Ambulatory Visit: Payer: BC Managed Care – PPO | Admitting: Neurology

## 2014-09-08 ENCOUNTER — Other Ambulatory Visit: Payer: Self-pay | Admitting: Family Medicine

## 2014-09-08 NOTE — Telephone Encounter (Signed)
Rx's sent to the pharmacy by e-script.//AB/CMA 

## 2014-10-01 ENCOUNTER — Ambulatory Visit: Payer: BC Managed Care – PPO | Attending: Neurology | Admitting: Occupational Therapy

## 2014-10-01 DIAGNOSIS — R279 Unspecified lack of coordination: Secondary | ICD-10-CM

## 2014-10-01 NOTE — Therapy (Signed)
[facility] [address] [place], [place], [address] Phone: [phone]   Fax:  [phone]  Patient Details  Name: Kristin [name] MRN: [id number] Date of Birth: 06/29/1951 Referring Provider:  Mosie Lukes, MD  Encounter Date: 10/01/2014  Occupational Therapy Parkinson's Disease Screen   Physical Performance Test item #4 (donning/doffing jacket):  8.56 sec  9-hole peg test:    RUE  23.63 sec        LUE  21.16sec  Box & Blocks Test:   RUE  53 blocks        LUE  60 blocks  Change in ability to perform ADLs/IADLs:  no   Pt does not require occupation therapy services at this time.  Recommended occupational therapy screen in  approx 6 months.   [facility], OTR/L 10/01/2014, 10:02 AM  [facility] [address] [place], [place], [address] Phone: [phone]   Fax:  [phone]

## 2014-10-05 ENCOUNTER — Encounter: Payer: Self-pay | Admitting: Family Medicine

## 2014-10-05 ENCOUNTER — Ambulatory Visit (INDEPENDENT_AMBULATORY_CARE_PROVIDER_SITE_OTHER): Payer: BC Managed Care – PPO | Admitting: Family Medicine

## 2014-10-05 VITALS — BP 115/78 | HR 79 | Temp 98.2°F | Ht 65.0 in | Wt 237.4 lb

## 2014-10-05 DIAGNOSIS — L719 Rosacea, unspecified: Secondary | ICD-10-CM

## 2014-10-05 DIAGNOSIS — I10 Essential (primary) hypertension: Secondary | ICD-10-CM

## 2014-10-05 DIAGNOSIS — M5417 Radiculopathy, lumbosacral region: Secondary | ICD-10-CM

## 2014-10-05 DIAGNOSIS — G20 Parkinson's disease: Secondary | ICD-10-CM

## 2014-10-05 DIAGNOSIS — C4491 Basal cell carcinoma of skin, unspecified: Secondary | ICD-10-CM

## 2014-10-05 DIAGNOSIS — F418 Other specified anxiety disorders: Secondary | ICD-10-CM

## 2014-10-05 DIAGNOSIS — M5416 Radiculopathy, lumbar region: Secondary | ICD-10-CM

## 2014-10-05 DIAGNOSIS — K219 Gastro-esophageal reflux disease without esophagitis: Secondary | ICD-10-CM

## 2014-10-05 DIAGNOSIS — E669 Obesity, unspecified: Secondary | ICD-10-CM

## 2014-10-05 DIAGNOSIS — G20A1 Parkinson's disease without dyskinesia, without mention of fluctuations: Secondary | ICD-10-CM

## 2014-10-05 DIAGNOSIS — D649 Anemia, unspecified: Secondary | ICD-10-CM

## 2014-10-05 DIAGNOSIS — M1711 Unilateral primary osteoarthritis, right knee: Secondary | ICD-10-CM

## 2014-10-05 DIAGNOSIS — H43399 Other vitreous opacities, unspecified eye: Secondary | ICD-10-CM

## 2014-10-05 DIAGNOSIS — M179 Osteoarthritis of knee, unspecified: Secondary | ICD-10-CM

## 2014-10-05 HISTORY — DX: Rosacea, unspecified: L71.9

## 2014-10-05 HISTORY — DX: Basal cell carcinoma of skin, unspecified: C44.91

## 2014-10-05 NOTE — Patient Instructions (Signed)

## 2014-10-05 NOTE — Assessment & Plan Note (Signed)
Well controlled, no changes to meds. Encouraged heart healthy diet such as the DASH diet and exercise as tolerated.  °

## 2014-10-05 NOTE — Assessment & Plan Note (Signed)
Using Finacea gel with good results

## 2014-10-05 NOTE — Assessment & Plan Note (Signed)
Avoid offending foods, start probiotics. Do not eat large meals in late evening and consider raising head of bed.  

## 2014-10-05 NOTE — Assessment & Plan Note (Signed)
Encouraged moist heat and gentle stretching as tolerated. May try NSAIDs and prescription meds as directed and report if symptoms worsen or seek immediate care. Is going to see a chiropractor today. Pain is managable on this current regimen

## 2014-10-05 NOTE — Assessment & Plan Note (Signed)
Biopsy proven, goes next week to have the full lesion on her back next week.

## 2014-10-05 NOTE — Assessment & Plan Note (Addendum)
Sees [facility] this week for second opinion largely due to family pressures. Is very happy with her care here in [place]. Will await her input. Following with Neuro Rehab class twice weekly

## 2014-10-05 NOTE — Assessment & Plan Note (Signed)
resolved 

## 2014-10-05 NOTE — Assessment & Plan Note (Signed)
Worsening pain in right knee, only when going up stairs. No redness, swelling. Is starting with chiropractic. Will need to consider sports med vs ortho if pain persists. Consider ice and [employer]

## 2014-10-05 NOTE — Assessment & Plan Note (Signed)
meds are stable, encouraged to start counseling to help with her diagnosis and weight management

## 2014-10-05 NOTE — Progress Notes (Signed)
Pre visit review using our clinic review tool, if applicable. No additional management support is needed unless otherwise documented below in the visit note. 

## 2014-10-05 NOTE — Assessment & Plan Note (Signed)
Stable. No flashes of light. No recent changes

## 2014-10-05 NOTE — Progress Notes (Signed)
ICA DAYE  [id number] Jun 27, 1951 10/05/2014      Progress Note-Follow Up  Subjective  Chief Complaint  Chief Complaint  Patient presents with  . Follow-up    3 mos    HPI  Patient is a 64 y.o. female in today for routine medical care. She is in  Today for follow up. Has been busy with the Holidays. Is going to [facility] this week for evaluation of her Parkinson's Disease. Is benefiting from exercising regularly. Goes to Neuro Rehab classes 2 x a week and cycles. Feels her meds for her depression around her disease states helps some but she is still struggling with anhedonia. No suicidal ideation. Denies CP/palp/SOB/HA/congestion/fevers/GI or GU c/o. Taking meds as prescribed  Past Medical History  Diagnosis Date  . Fibroids   . Abnormal vaginal Pap smear   . Anemia   . GERD (gastroesophageal reflux disease)   . Hyperlipidemia   . Hypertension   . Arthritis   . Anxiety   . Obesity   . Hiatal hernia   . Esophageal stricture 1994  . Chronic UTI     sees dr Terance Hart  . Measles as a child  . Chicken pox as a child  . 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 Depression with anxiety 11/02/2009    Qualifier: Diagnosis of  By: [name], LPN, [name]   . Plantar fasciitis of left foot 06/19/2012  . Dermatitis 07/17/2012  . Urinary frequency 02/25/2013  . Acute pharyngitis 02/25/2013  . Hyperglycemia 08/19/2013  . Visual floaters 05/11/2014  . Foot pain, bilateral 06/19/2012  . BCC (basal cell carcinoma of skin) 10/05/2014    On back  . Rosacea 10/05/2014    Past Surgical History  Procedure Laterality Date  . Wisdom teeth extracted    . Laporoscopy    . Tonsillectomy    . Abdominal hysterectomy  2006    total  . Partial hip arthroplasty  2010    right  . Esophageal      stretching    Family History  Problem Relation Age of Onset  . Cancer Mother     breast  . Other Mother     arrythmia  . Mental illness Mother     bipolar  . Heart disease Father   . Arthritis Father       rheumatoid  . Depression Sister   . Arthritis Sister   . Colon cancer Neg Hx   . Esophageal cancer Neg Hx   . Rectal cancer Neg Hx   . Stomach cancer Neg Hx     History   Social History  . Marital Status: Married    Spouse Name: N/A    Number of Children: N/A  . Years of Education: N/A   Occupational History  . Not on file.   Social History Main Topics  . Smoking status: Never Smoker   . Smokeless tobacco: Never Used  . Alcohol Use: 2.4 oz/week    4 Glasses of wine per week     Comment: 4 glasses of wine weekly  . Drug Use: No  . Sexual Activity:    Partners: Male   Other Topics Concern  . Not on file   Social History Narrative    Current Outpatient Prescriptions on File Prior to Visit  Medication Sig Dispense Refill  . ALPRAZolam (XANAX) 0.5 MG tablet Take 0.5 mg by mouth as needed for anxiety or sleep.    . bisacodyl (DULCOLAX) 5 MG EC tablet Take 5 mg  by mouth daily as needed for moderate constipation.    . CRESTOR 10 MG tablet TAKE 1 TABLET BY MOUTH ON MONDAYS, WEDNESDAYS, AND FRIDAYS 30 tablet 6  . DULoxetine (CYMBALTA) 30 MG capsule take 1 capsule by mouth once daily 30 capsule 3  . escitalopram (LEXAPRO) 20 MG tablet take 1 tablet by mouth once daily 30 tablet 3  . Krill Oil CAPS 1 krill oil caps daily, MegaRed caps by Schiff    . omeprazole (PRILOSEC) 40 MG capsule take 1 capsule by mouth daily 30 capsule 11  . pramipexole (MIRAPEX) 0.5 MG tablet Take 1 tablet (0.5 mg total) by mouth 3 (three) times daily. 90 tablet 5  . telmisartan-hydrochlorothiazide (MICARDIS HCT) 80-25 MG per tablet 1/2 tab po daily 90 tablet 3   No current facility-administered medications on file prior to visit.    Allergies  Allergen Reactions  . Bactrim [Sulfamethoxazole-Trimethoprim] Other (See Comments)    Oral ulcers and rash  . Penicillins Hives    Review of Systems  Review of Systems  Constitutional: Positive for malaise/fatigue. Negative for fever.  HENT:  Negative for congestion.   Eyes: Negative for discharge.  Respiratory: Negative for shortness of breath.   Cardiovascular: Negative for chest pain, palpitations and leg swelling.  Gastrointestinal: Negative for nausea, abdominal pain and diarrhea.  Genitourinary: Negative for dysuria.  Musculoskeletal: Positive for joint pain. Negative for falls.       Right knee pain with going up stairs  Skin: Negative for rash.  Neurological: Positive for tremors. Negative for loss of consciousness and headaches.  Endo/Heme/Allergies: Negative for polydipsia.  Psychiatric/Behavioral: Positive for depression. Negative for suicidal ideas. The patient is not nervous/anxious and does not have insomnia.     Objective  BP 115/78 mmHg  Pulse 79  Temp(Src) 98.2 F (36.8 C) (Oral)  Ht 5\' 5"  (1.651 m)  Wt 237 lb 6.4 oz (107.684 kg)  BMI 39.51 kg/m2  SpO2 94%  Physical Exam  Physical Exam  Constitutional: She is oriented to person, place, and time and well-developed, well-nourished, and in no distress. No distress.  HENT:  Head: Normocephalic and atraumatic.  Eyes: Conjunctivae are normal.  Neck: Neck supple. No thyromegaly present.  Cardiovascular: Normal rate, regular rhythm and normal heart sounds.   No murmur heard. Pulmonary/Chest: Effort normal and breath sounds normal. She has no wheezes.  Abdominal: She exhibits no distension and no mass.  Musculoskeletal: She exhibits no edema.  Lymphadenopathy:    She has no cervical adenopathy.  Neurological: She is alert and oriented to person, place, and time.  Skin: Skin is warm and dry. No rash noted. She is not diaphoretic.  Psychiatric: Memory, affect and judgment normal.    Lab Results  Component Value Date   TSH 1.58 05/05/2014   Lab Results  Component Value Date   WBC 6.4 05/05/2014   HGB 12.6 05/05/2014   HCT 38.8 05/05/2014   MCV 89.2 05/05/2014   PLT 275.0 05/05/2014   Lab Results  Component Value Date   CREATININE 0.8  05/05/2014   BUN 17 05/05/2014   NA 141 05/05/2014   K 3.9 05/05/2014   CL 103 05/05/2014   CO2 28 05/05/2014   Lab Results  Component Value Date   ALT 19 05/05/2014   AST 22 05/05/2014   ALKPHOS 65 05/05/2014   BILITOT 0.8 05/05/2014   Lab Results  Component Value Date   CHOL 158 05/05/2014   Lab Results  Component Value Date   HDL  49.90 05/05/2014   Lab Results  Component Value Date   LDLCALC 81 05/05/2014   Lab Results  Component Value Date   TRIG 136.0 05/05/2014   Lab Results  Component Value Date   CHOLHDL 3 05/05/2014     Assessment & Plan  Essential hypertension Well controlled, no changes to meds. Encouraged heart healthy diet such as the DASH diet and exercise as tolerated.    GERD Avoid offending foods, start probiotics. Do not eat large meals in late evening and consider raising head of bed.    Obesity Encouraged DASH diet, decrease po intake and increase exercise as tolerated. Needs 7-8 hours of sleep nightly. Avoid trans fats, eat small, frequent meals every 4-5 hours with lean proteins, complex carbs and healthy fats. Minimize simple carbs, GMO foods.   Lumbar back pain with radiculopathy affecting left lower extremity Encouraged moist heat and gentle stretching as tolerated. May try NSAIDs and prescription meds as directed and report if symptoms worsen or seek immediate care. Is going to see a chiropractor today. Pain is managable on this current regimen   Osteoarthritis Worsening pain in right knee, only when going up stairs. No redness, swelling. Is starting with chiropractic. Will need to consider sports med vs ortho if pain persists. Consider ice and [place]   Parkinson's disease Sees [facility] this week for second opinion largely due to family pressures. Is very happy with her care here in [place]. Will await her input. Following with Neuro Rehab class twice weekly   BCC (basal cell carcinoma of skin) Biopsy proven, goes next week to  have the full lesion on her back next week.   Visual floaters Stable. No flashes of light. No recent changes   Rosacea Using Finacea gel with good results   Anemia resolved   Depression with anxiety meds are stable, encouraged to start counseling to help with her diagnosis and weight management

## 2014-10-05 NOTE — Assessment & Plan Note (Signed)
Encouraged DASH diet, decrease po intake and increase exercise as tolerated. Needs 7-8 hours of sleep nightly. Avoid trans fats, eat small, frequent meals every 4-5 hours with lean proteins, complex carbs and healthy fats. Minimize simple carbs, GMO foods. 

## 2014-10-15 ENCOUNTER — Ambulatory Visit: Payer: BC Managed Care – PPO

## 2014-10-15 ENCOUNTER — Ambulatory Visit: Payer: BC Managed Care – PPO | Admitting: Occupational Therapy

## 2014-10-15 ENCOUNTER — Ambulatory Visit: Payer: BC Managed Care – PPO | Attending: Neurology | Admitting: Physical Therapy

## 2014-10-15 DIAGNOSIS — R279 Unspecified lack of coordination: Secondary | ICD-10-CM

## 2014-10-15 DIAGNOSIS — G20 Parkinson's disease: Secondary | ICD-10-CM

## 2014-10-15 DIAGNOSIS — R490 Dysphonia: Secondary | ICD-10-CM

## 2014-10-15 NOTE — Therapy (Signed)
[facility] [address] [place], [place], [address] Phone: [phone]   Fax:  [phone]  Patient Details  Name: Kristin [name] MRN: [id number] Date of Birth: 04/06/1951 Referring Provider:  Mosie Lukes, MD  Encounter Date: 10/15/2014 Speech Therapy Parkinson's Disease Screen    Decibel Level today: 71dB  (WNL=70-72 dB) with sound level meter 30cm away from pt's mouth Pt's conversational volume has remained WNL since last treatment course  Pt has not experienced difficulty in swallowing warranting objective evaluation. She reported coughing x1/month with liquids. Modified barium swallow exam in June 2015 revealed some symptoms not uncommon to esophageal dysphagia.  Pt does does not require skilled speech therapy services at this time. Recommend ST screen in another 5-7 months    [facility] 10/15/2014, 10:26 AM  [facility] [address] [place], [place], [address] Phone: [phone]   Fax:  [phone]

## 2014-10-15 NOTE — Therapy (Signed)
Physical Therapy Parkinson's Disease Screen   Timed Up and Go test:8.38 seconds, manual 7.91 seconds, cognitive 8.53 seconds  10 meter walk test:3.58 ft/second  5 time sit to stand test:  9.91 seconds     Patient does not require Physical Therapy services at this time.  Recommend Physical Therapy screen in 6 months.   Narda Bonds, [place] 10/15/2014 10:56 AM Phone: [phone] Fax: [phone]

## 2014-10-21 ENCOUNTER — Encounter: Payer: Self-pay | Admitting: Family Medicine

## 2014-11-04 ENCOUNTER — Encounter: Payer: Self-pay | Admitting: Neurology

## 2014-11-04 ENCOUNTER — Telehealth: Payer: Self-pay | Admitting: *Deleted

## 2014-11-04 NOTE — Telephone Encounter (Signed)
Please d/c Omeprazole and start Pantoprazole 40 mg po qd, disp #30 with 5 rf or #90 with 1 rf at patient discretion

## 2014-11-04 NOTE — Telephone Encounter (Signed)
Prior authorization for omeprazole denied. Covered alternatives include esomeprazole and pantoprazole. Please advise. JG//CMA

## 2014-11-05 MED ORDER — PANTOPRAZOLE SODIUM 40 MG PO TBEC: 40.0000 mg | DELAYED_RELEASE_TABLET | Freq: Every day | ORAL | Status: DC

## 2014-11-05 NOTE — Telephone Encounter (Signed)
Patient informed of change and sent in Pantoprazole to her local pharmacy

## 2014-11-05 NOTE — Telephone Encounter (Signed)
Called left a message on both cell and home number to call back. Did d/c Omeprazole and did add pantoprazole.

## 2014-11-05 NOTE — Telephone Encounter (Signed)
Patient states that she is still taking omeprazole. Best # [phone]

## 2014-11-10 ENCOUNTER — Encounter: Payer: Self-pay | Admitting: Neurology

## 2014-11-11 ENCOUNTER — Ambulatory Visit (INDEPENDENT_AMBULATORY_CARE_PROVIDER_SITE_OTHER): Payer: BC Managed Care – PPO | Admitting: Neurology

## 2014-11-11 ENCOUNTER — Encounter: Payer: Self-pay | Admitting: Neurology

## 2014-11-11 VITALS — BP 120/70 | HR 88 | Ht 65.0 in | Wt 234.0 lb

## 2014-11-11 DIAGNOSIS — F418 Other specified anxiety disorders: Secondary | ICD-10-CM

## 2014-11-11 DIAGNOSIS — G20 Parkinson's disease: Secondary | ICD-10-CM

## 2014-11-11 MED ORDER — PRAMIPEXOLE DIHYDROCHLORIDE 0.5 MG PO TABS: 0.5000 mg | ORAL_TABLET | Freq: Three times a day (TID) | ORAL | Status: DC

## 2014-11-11 NOTE — Progress Notes (Signed)
Subjective:    Kristin [name] was seen in consultation in the movement disorder clinic at the request of [name], MD.  The evaluation is for tremor.  The patient is a 64 y.o. left handed female with a history of tremor.  The records that were made available to me were reviewed.  According to records, the patient has complained about tremor to her previous primary care physician all the way back to 2012, but in those records she had said that tremor had been going on for years.  She states today, however, that she really thinks that it started in 2012.  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  R hand was always worse even though she is L hand dominant.  It has slowly progressed.  She reports that tremor is in both hands now (R still worse) but she feels tremor on the inside of the body all of the time.  She has tremor at rest now.  She feels nervous now and is unsure if it was related.  She also has the sweats and so she was placed on xanax but that doesn't help the sweating.  She recently went to an exercise class and noted that she wasn't as coordinated/strong with the R hand and that worried her and that is why she wanted a further evaluation.    She did see [name] in June, 2013 and I reviewed that note.  She was diagnosed with essential tremor.  No treatment was recommended.  There is  family hx of tremor in her father.  She states that she has been on metoprolol since 2012 and didn't think that it helped but when she tried to get off of it a year ago, the tremor got worse and she couldn't get off of it.    03/09/14 update:  The patient presents today for follow up.  Today, she is accompanied by her husband who helps to supplement the history.  He was not present on the prior visit.  She was diagnosed with PD last visit.  I started her on Mirapex.  She has been doing better in terms of tremor.  Interestingly, sweats and sleep have been better.  She just  retired last week.  No side effects with the Mirapex.  She is currently taking it at 9 AM/2 PM/9 PM. No compulsive behaviors.  No sleep attacks.  She has been attending therapy.  She is planning on starting the Parkinson's exercise class.  She has been educating herself and reading patient education material.  She had a modified barium evaluation on 02/25/2014.  It was normal, although they suspected that she had mild esophageal dysphagia.  Her husband asks multiple questions today and asked about potentially having another opinion.  The patient has had no falls.  No hallucinations.  07/15/14 update:  Pt is f/u today, accompanied by her husband who supplements the history.  Pt is on mirapex 0.5 mg three times per day.  I stopped her metoprolol last visit. She did fine with that. She is exercising with the Moves class and with circuit II class.  She is also enrolled in the bike class at the [facility].  She is having some soreness because of the exercise and hip pain because of it and she has had a hip replacement and worries about that as she doesn't want to have another.  Thinking about trying chiropractics for that.  She asks me about some.  Stages that she has on the lateral aspect of the knees.  She does not wear boots.  She has not had a knee replacement.  She also complains of some achiness in her toes and it gets better as she walks.  No falls.  No lightheadedness.    11/11/14 update:  The patient returns today for follow-up.  She is on Mirapex, 0.5 mg 3 times per day.  Since our last visit, the patient did go to [facility] for a second opinion.  She saw Dr. [name].  This was just 2 days ago.  No notes are available.  They state that they had a good visit, are glad that they went and got a second opinion and no new recommendations/treatments were given.  She has been doing well at home.  She is very active in our community exercise programs for PD.  She is enrolled in the [facility] exercise program and in our circuit  class and is walking on the treadmill.  She notices tremor in public but generally not as much at home.  She notes wearing off of the medication but only when time for the next dosage.    Current/Previously tried tremor medications: metoprololol  Current medications that may exacerbate tremor:  Cymbalta, although I reviewed that the patient did try to change to Pristiq but the patient had nightmares with Pristiq and changed back to Cymbalta  Outside reports reviewed: historical medical records, lab reports and referral letter/letters.  Allergies  Allergen Reactions  . Bactrim [Sulfamethoxazole-Trimethoprim] Other (See Comments)    Oral ulcers and rash  . Penicillins Hives    Current Outpatient Prescriptions on File Prior to Visit  Medication Sig Dispense Refill  . ALPRAZolam (XANAX) 0.5 MG tablet Take 0.5 mg by mouth as needed for anxiety or sleep.    . bisacodyl (DULCOLAX) 5 MG EC tablet Take 5 mg by mouth daily as needed for moderate constipation.    . CRESTOR 10 MG tablet TAKE 1 TABLET BY MOUTH ON MONDAYS, WEDNESDAYS, AND FRIDAYS 30 tablet 6  . DULoxetine (CYMBALTA) 30 MG capsule take 1 capsule by mouth once daily 30 capsule 3  . escitalopram (LEXAPRO) 20 MG tablet take 1 tablet by mouth once daily 30 tablet 3  . Krill Oil CAPS 1 krill oil caps daily, MegaRed caps by Schiff    . pantoprazole (PROTONIX) 40 MG tablet Take 1 tablet (40 mg total) by mouth daily. 30 tablet 5  . telmisartan-hydrochlorothiazide (MICARDIS HCT) 80-25 MG per tablet 1/2 tab po daily 90 tablet 3   No current facility-administered medications on file prior to visit.    Past Medical History  Diagnosis Date  . Fibroids   . Abnormal vaginal Pap smear   . Anemia   . GERD (gastroesophageal reflux disease)   . Hyperlipidemia   . Hypertension   . Arthritis   . Anxiety   . Obesity   . Hiatal hernia   . Esophageal stricture 1994  . Chronic UTI     sees dr Terance Hart  . Measles as a child  . Chicken pox as a  child  . 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 Depression with anxiety 11/02/2009    Qualifier: Diagnosis of  By: [name], LPN, [name]   . Plantar fasciitis of left foot 06/19/2012  . Dermatitis 07/17/2012  . Urinary frequency 02/25/2013  . Acute pharyngitis 02/25/2013  . Hyperglycemia 08/19/2013  . Visual floaters 05/11/2014  . Foot pain, bilateral 06/19/2012  . BCC (basal cell carcinoma  of skin) 10/05/2014    On back  . Rosacea 10/05/2014    Past Surgical History  Procedure Laterality Date  . Wisdom teeth extracted    . Laporoscopy    . Tonsillectomy    . Abdominal hysterectomy  2006    total  . Partial hip arthroplasty  2010    right  . Esophageal      stretching    History   Social History  . Marital Status: Married    Spouse Name: N/A  . Number of Children: N/A  . Years of Education: N/A   Occupational History  . Not on file.   Social History Main Topics  . Smoking status: Never Smoker   . Smokeless tobacco: Never Used  . Alcohol Use: 2.4 oz/week    4 Glasses of wine per week     Comment: 4 glasses of wine weekly  . Drug Use: No  . Sexual Activity:    Partners: Male   Other Topics Concern  . Not on file   Social History Narrative    Family Status  Relation Status Death Age  . Mother Deceased 72    Dementia, breast cancer, bipolar  . Father Deceased 48    heart failure, rheumatoid arthritis  . Sister Alive     arthritis  . Sister Alive   . Daughter Alive     12  . Son Alive     5  . Maternal Grandmother Deceased   . Maternal Grandfather Deceased   . Paternal Grandmother Deceased   . Paternal Grandfather Deceased   . Sister Alive      healthy  . Sister Alive     healthy  . Daughter Alive     28/ healthy    Review of Systems A complete 10 system ROS was obtained and was negative apart from what is mentioned.   Objective:   VITALS:   Filed Vitals:   11/11/14 0908  BP: 120/70  Pulse: 88  Height: 5\' 5"  (1.651 m)  Weight: 234 lb  (106.142 kg)   Wt Readings from Last 3 Encounters:  11/11/14 234 lb (106.142 kg)  10/05/14 237 lb 6.4 oz (107.684 kg)  07/15/14 229 lb 9.6 oz (104.146 kg)     Gen:  Appears stated age and in NAD.  She is diaphoretic (room is also hot) HEENT:  Normocephalic, atraumatic. The mucous membranes are moist.  Cardiovascular: Regular rate and rhythm Lungs: Clear to auscultation bilaterally Neck: No carotid bruits  NEUROLOGICAL:  Orientation: The patient is alert and oriented x3. Fund of knowledge is appropriate.  Recent and remote memory are intact.  Attention and concentration are normal.    Able to name objects and repeat phrases. Cranial nerves: There is good facial symmetry. The visual fields are full to confrontational testing. The speech is fluent and clear. Soft palate rises symmetrically and there is no tongue deviation. Hearing is intact to conversational tone. Sensation: Sensation is intact to light touch throughout. Motor: Strength is 5/5 in the bilateral upper and lower extremities.   Shoulder shrug is equal and symmetric.  There is no pronator drift.   Movement examination: Tone: There is slight increased tone in the RUE.  Tone is normal in the RLE.   Tone in the left upper and left lower extremity was normal.  Abnormal movements: There is a intermittent right upper extremity resting tremor.   Coordination:  There is no significant decremation with rapid alternating movements in the upper or  lower extremities. Gait and Station: The patient has no significant difficulty arising out of a deep-seated chair without the use of the hands. The patient's stride length is fairly normal, but there is  decreased arm swing on the right.  The patient has a negative pull test.      LABS  Lab Results  Component Value Date   TSH 1.58 05/05/2014   Lab Results  Component Value Date   WBC 6.4 05/05/2014   HGB 12.6 05/05/2014   HCT 38.8 05/05/2014   MCV 89.2 05/05/2014   PLT 275.0 05/05/2014      Chemistry      Component Value Date/Time   NA 141 05/05/2014 0948   K 3.9 05/05/2014 0948   CL 103 05/05/2014 0948   CO2 28 05/05/2014 0948   BUN 17 05/05/2014 0948   CREATININE 0.8 05/05/2014 0948   CREATININE 0.75 12/29/2013 0850      Component Value Date/Time   CALCIUM 9.6 05/05/2014 0948   ALKPHOS 65 05/05/2014 0948   AST 22 05/05/2014 0948   ALT 19 05/05/2014 0948   BILITOT 0.8 05/05/2014 0948          Assessment/Plan:   1. idiopathic Parkinson's disease.  The patient has tremor, bradykinesia, rigidity and mild postural instability.  -She will continue on pramipexole 0.5 mg tid.  Talked about potentially increasing the dose but she didn't think that she was ready to do that.   Risks, benefits, side effects and alternative therapies were discussed.  The opportunity to ask questions was given and they were answered to the best of my ability.  The patient expressed understanding and willingness to follow the outlined treatment protocols.  -I encouraged her to continue the cardiovascular exercise.  She is really doing a great job in this regard.  -will get notes from [facility] when they are available  -talked about diet as relates to PD  Chiropractor told her about all sorts of vitamin supplements and told her that I don't think that she needs all of these.  Discussed well balanced diet both for PD but talked about weight loss just for overall health and wellness.  -the patient would like a second opinion at [facility].  I will refer her. 2.  Depression  -This certainly could be associated with Parkinson's disease, or could be independent of it.  She is on a combination of Lexapro and Cymbalta.   3.  No Follow-up on file.

## 2014-11-30 ENCOUNTER — Other Ambulatory Visit: Payer: Self-pay | Admitting: Family Medicine

## 2014-11-30 MED ORDER — ROSUVASTATIN CALCIUM 10 MG PO TABS: ORAL_TABLET | ORAL | Status: DC

## 2015-01-04 ENCOUNTER — Ambulatory Visit (INDEPENDENT_AMBULATORY_CARE_PROVIDER_SITE_OTHER): Payer: BC Managed Care – PPO | Admitting: Family Medicine

## 2015-01-04 ENCOUNTER — Encounter: Payer: Self-pay | Admitting: Family Medicine

## 2015-01-04 VITALS — BP 120/82 | HR 81 | Temp 98.5°F | Ht 65.0 in | Wt 234.2 lb

## 2015-01-04 DIAGNOSIS — E162 Hypoglycemia, unspecified: Secondary | ICD-10-CM

## 2015-01-04 DIAGNOSIS — I10 Essential (primary) hypertension: Secondary | ICD-10-CM | POA: Diagnosis not present

## 2015-01-04 DIAGNOSIS — K219 Gastro-esophageal reflux disease without esophagitis: Secondary | ICD-10-CM

## 2015-01-04 DIAGNOSIS — Z23 Encounter for immunization: Secondary | ICD-10-CM | POA: Diagnosis not present

## 2015-01-04 DIAGNOSIS — R739 Hyperglycemia, unspecified: Secondary | ICD-10-CM

## 2015-01-04 DIAGNOSIS — E785 Hyperlipidemia, unspecified: Secondary | ICD-10-CM | POA: Diagnosis not present

## 2015-01-04 DIAGNOSIS — F418 Other specified anxiety disorders: Secondary | ICD-10-CM

## 2015-01-04 LAB — LIPID PANEL
Cholesterol: 176 mg/dL (ref 0–200)
HDL: 55.9 mg/dL (ref >39.00)
LDL Cholesterol: 98 mg/dL (ref 0–99)
NonHDL: 120.1
Total CHOL/HDL Ratio: 3
Triglycerides: 112 mg/dL (ref 0.0–149.0)
VLDL: 22.4 mg/dL (ref 0.0–40.0)

## 2015-01-04 LAB — CBC
HCT: 36.7 % (ref 36.0–46.0)
Hemoglobin: 12.3 g/dL (ref 12.0–15.0)
MCHC: 33.6 g/dL (ref 30.0–36.0)
MCV: 85.5 fl (ref 78.0–100.0)
Platelets: 216 10*3/uL (ref 150.0–400.0)
RBC: 4.29 Mil/uL (ref 3.87–5.11)
RDW: 15 % (ref 11.5–15.5)
WBC: 6 10*3/uL (ref 4.0–10.5)

## 2015-01-04 LAB — COMPLETE METABOLIC PANEL WITH GFR
ALT: 22 U/L (ref 0–35)
AST: 21 U/L (ref 0–37)
Albumin: 4.1 g/dL (ref 3.5–5.2)
Alkaline Phosphatase: 63 U/L (ref 39–117)
BUN: 16 mg/dL (ref 6–23)
CO2: 26 mEq/L (ref 19–32)
Calcium: 9.2 mg/dL (ref 8.4–10.5)
Chloride: 107 mEq/L (ref 96–112)
Creat: 0.71 mg/dL (ref 0.50–1.10)
GFR, Est African American: >89 mL/min
GFR, Est Non African American: >89 mL/min
Glucose, Bld: 83 mg/dL (ref 70–99)
Potassium: 3.9 mEq/L (ref 3.5–5.3)
Sodium: 143 mEq/L (ref 135–145)
Total Bilirubin: 0.6 mg/dL (ref 0.2–1.2)
Total Protein: 6.6 g/dL (ref 6.0–8.3)

## 2015-01-04 LAB — HEMOGLOBIN A1C: Hgb A1c MFr Bld: 5.9 % (ref 4.6–6.5)

## 2015-01-04 LAB — TSH: TSH: 1.1 u[IU]/mL (ref 0.35–4.50)

## 2015-01-04 MED ORDER — TELMISARTAN-HCTZ 80-25 MG PO TABS: ORAL_TABLET | ORAL | Status: DC

## 2015-01-04 MED ORDER — ESCITALOPRAM OXALATE 20 MG PO TABS: 20.0000 mg | ORAL_TABLET | Freq: Every day | ORAL | Status: DC

## 2015-01-04 MED ORDER — DULOXETINE HCL 30 MG PO CPEP: 30.0000 mg | ORAL_CAPSULE | Freq: Every day | ORAL | Status: DC

## 2015-01-04 MED ORDER — ZOSTER VACCINE LIVE 19400 UNT/0.65ML ~~LOC~~ SOLR: 0.6500 mL | Freq: Once | SUBCUTANEOUS | Status: DC

## 2015-01-04 MED ORDER — ALPRAZOLAM 0.5 MG PO TABS: 0.5000 mg | ORAL_TABLET | ORAL | Status: DC | PRN

## 2015-01-04 MED ORDER — OMEPRAZOLE 20 MG PO CPDR: 20.0000 mg | DELAYED_RELEASE_CAPSULE | ORAL | Status: DC

## 2015-01-04 MED ORDER — ROSUVASTATIN CALCIUM 10 MG PO TABS: ORAL_TABLET | ORAL | Status: DC

## 2015-01-04 MED ORDER — RANITIDINE HCL 150 MG PO TABS: 150.0000 mg | ORAL_TABLET | ORAL | Status: DC

## 2015-01-04 NOTE — Assessment & Plan Note (Signed)
Patient has proceeded with [facility] and has had the Parkinson's Diagnosis, she is comfortable now and is doing well. No changes to meds. Will continue to follow with Dr Tat

## 2015-01-04 NOTE — Assessment & Plan Note (Signed)
Encouraged DASH diet, decrease po intake and increase exercise as tolerated. Needs 7-8 hours of sleep nightly. Avoid trans fats, eat small, frequent meals every 4-5 hours with lean proteins, complex carbs and healthy fats. Minimize simple carbs, GMO foods. 

## 2015-01-04 NOTE — Progress Notes (Signed)
Pre visit review using our clinic review tool, if applicable. No additional management support is needed unless otherwise documented below in the visit note. 

## 2015-01-04 NOTE — Assessment & Plan Note (Addendum)
Is willing to consider counseling, may continue meds for now

## 2015-01-04 NOTE — Patient Instructions (Addendum)
  Consider NOW company 10 strain probiotic daily    Food Choices for Gastroesophageal Reflux Disease When you have gastroesophageal reflux disease (GERD), the foods you eat and your eating habits are very important. Choosing the right foods can help ease your discomfort.  WHAT GUIDELINES DO I NEED TO FOLLOW?   Choose fruits, vegetables, whole grains, and low-fat dairy products.   Choose low-fat meat, fish, and poultry.  Limit fats such as oils, salad dressings, butter, nuts, and avocado.   Keep a food diary. This helps you identify foods that cause symptoms.   Avoid foods that cause symptoms. These may be different for everyone.   Eat small meals often instead of 3 large meals a day.   Eat your meals slowly, in a place where you are relaxed.   Limit fried foods.   Cook foods using methods other than frying.   Avoid drinking alcohol.   Avoid drinking large amounts of liquids with your meals.   Avoid bending over or lying down until 2-3 hours after eating.  WHAT FOODS ARE NOT RECOMMENDED?  These are some foods and drinks that may make your symptoms worse: Vegetables Tomatoes. Tomato juice. Tomato and spaghetti sauce. Chili peppers. Onion and garlic. Horseradish. Fruits Oranges, grapefruit, and lemon (fruit and juice). Meats High-fat meats, fish, and poultry. This includes hot dogs, ribs, ham, sausage, salami, and bacon. Dairy Whole milk and chocolate milk. Sour cream. Cream. Butter. Ice cream. Cream cheese.  Drinks Coffee and tea. Bubbly (carbonated) drinks or energy drinks. Condiments Hot sauce. Barbecue sauce.  Sweets/Desserts Chocolate and cocoa. Donuts. Peppermint and spearmint. Fats and Oils High-fat foods. This includes [place] fries and potato chips. Other Vinegar. Strong spices. This includes black pepper, white pepper, red pepper, cayenne, curry powder, cloves, ginger, and chili powder. The items listed above may not be a complete list of foods and  drinks to avoid. Contact your dietitian for more information. Document Released: 02/20/2012 Document Revised: 08/26/2013 Document Reviewed: 06/25/2013 [facility] Patient Information 2015 [place], [place]. This information is not intended to replace advice given to you by your health care provider. Make sure you discuss any questions you have with your health care provider.

## 2015-01-04 NOTE — Assessment & Plan Note (Signed)
Avoid offending foods, start probiotics. Do not eat large meals in late evening and consider raising head of bed.  

## 2015-01-04 NOTE — Assessment & Plan Note (Signed)
Well controlled, no changes to meds. Encouraged heart healthy diet such as the DASH diet and exercise as tolerated.  °

## 2015-01-04 NOTE — Assessment & Plan Note (Signed)
Tolerating statin, encouraged heart healthy diet, avoid trans fats, minimize simple carbs and saturated fats. Increase exercise as tolerated 

## 2015-01-10 NOTE — Assessment & Plan Note (Signed)
hgba1c acceptable, minimize simple carbs. Increase exercise as tolerated. Continue current meds 

## 2015-01-10 NOTE — Progress Notes (Signed)
Kristin [name]  [id number] [birth year] 01/10/2015      Progress Note-Follow Up  Subjective  Chief Complaint  Chief Complaint  Patient presents with  . Follow-up    3 month.  Rx given for shingles vaccine    HPI  Patient is a 64 y.o. female in today for routine medical care. Patient is in today for follow-up. She has had her second opinion with neurology at [facility] and is satisfied with her diagnosis. Really it was her family to need satisfying and they are satisfied as well. She's not had recent illness. She continues to struggle with tremor most notably in her right arm as well as right knee pain. Struggles with some heartburn but has a decent response to omeprazole. No recent illness or acute concerns. Discussed that from a vacation in [place] and had a great time. Denies CP/palp/SOB/HA/congestion/fevers/GI or GU c/o. Taking meds as prescribed  Past Medical History  Diagnosis Date  . Fibroids   . Abnormal vaginal Pap smear   . Anemia   . GERD (gastroesophageal reflux disease)   . Hyperlipidemia   . Hypertension   . Arthritis   . Anxiety   . Obesity   . Hiatal hernia   . Esophageal stricture 1994  . Chronic UTI     sees dr Terance Hart  . Measles as a child  . Chicken pox as a child  . 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 Depression with anxiety 11/02/2009    Qualifier: Diagnosis of  By: [name], LPN, [name]   . Plantar fasciitis of left foot 06/19/2012  . Dermatitis 07/17/2012  . Urinary frequency 02/25/2013  . Acute pharyngitis 02/25/2013  . Hyperglycemia 08/19/2013  . Visual floaters 05/11/2014  . Foot pain, bilateral 06/19/2012  . BCC (basal cell carcinoma of skin) 10/05/2014    On back  . Rosacea 10/05/2014    Past Surgical History  Procedure Laterality Date  . Wisdom teeth extracted    . Laporoscopy    . Tonsillectomy    . Abdominal hysterectomy  2006    total  . Partial hip arthroplasty  2010    right  . Esophageal      stretching    Family History  Problem  Relation Age of Onset  . Cancer Mother     breast  . Other Mother     arrythmia  . Mental illness Mother     bipolar  . Heart disease Father   . Arthritis Father     rheumatoid  . Depression Sister   . Arthritis Sister   . Colon cancer Neg Hx   . Esophageal cancer Neg Hx   . Rectal cancer Neg Hx   . Stomach cancer Neg Hx     History   Social History  . Marital Status: Married    Spouse Name: N/A  . Number of Children: N/A  . Years of Education: N/A   Occupational History  . Not on file.   Social History Main Topics  . Smoking status: Never Smoker   . Smokeless tobacco: Never Used  . Alcohol Use: 2.4 oz/week    4 Glasses of wine per week     Comment: 4 glasses of wine weekly  . Drug Use: No  . Sexual Activity:    Partners: Male   Other Topics Concern  . Not on file   Social History Narrative    Current Outpatient Prescriptions on File Prior to Visit  Medication Sig Dispense Refill  . bisacodyl (  DULCOLAX) 5 MG EC tablet Take 5 mg by mouth daily as needed for moderate constipation.    Javier Docker Oil CAPS 1 krill oil caps daily, MegaRed caps by Schiff    . pramipexole (MIRAPEX) 0.5 MG tablet Take 1 tablet (0.5 mg total) by mouth 3 (three) times daily. 270 tablet 3   No current facility-administered medications on file prior to visit.    Allergies  Allergen Reactions  . Bactrim [Sulfamethoxazole-Trimethoprim] Other (See Comments)    Oral ulcers and rash  . Penicillins Hives    Review of Systems  Review of Systems  Constitutional: Positive for malaise/fatigue. Negative for fever.  HENT: Negative for congestion.   Eyes: Negative for discharge.  Respiratory: Negative for shortness of breath.   Cardiovascular: Negative for chest pain, palpitations and leg swelling.  Gastrointestinal: Positive for heartburn. Negative for nausea, abdominal pain and diarrhea.  Genitourinary: Negative for dysuria.  Musculoskeletal: Negative for falls.  Skin: Negative for rash.    Neurological: Positive for tremors. Negative for loss of consciousness and headaches.  Endo/Heme/Allergies: Negative for polydipsia.  Psychiatric/Behavioral: Positive for depression. Negative for suicidal ideas. The patient is nervous/anxious. The patient does not have insomnia.     Objective  BP 120/82 mmHg  Pulse 81  Temp(Src) 98.5 F (36.9 C) (Oral)  Ht 5\' 5"  (1.651 m)  Wt 234 lb 4 oz (106.255 kg)  BMI 38.98 kg/m2  SpO2 97%  Physical Exam  Physical Exam  Constitutional: She is oriented to person, place, and time and well-developed, well-nourished, and in no distress. No distress.  HENT:  Head: Normocephalic and atraumatic.  Right Ear: External ear normal.  Left Ear: External ear normal.  Nose: Nose normal.  Mouth/Throat: Oropharynx is clear and moist. No oropharyngeal exudate.  Eyes: Conjunctivae are normal. Pupils are equal, round, and reactive to light. Right eye exhibits no discharge. Left eye exhibits no discharge. No scleral icterus.  Neck: Normal range of motion. Neck supple. No thyromegaly present.  Cardiovascular: Normal rate, regular rhythm, normal heart sounds and intact distal pulses.   No murmur heard. Pulmonary/Chest: Effort normal and breath sounds normal. No respiratory distress. She has no wheezes. She has no rales.  Abdominal: Soft. Bowel sounds are normal. She exhibits no distension and no mass. There is no tenderness.  Musculoskeletal: Normal range of motion. She exhibits no edema or tenderness.  Lymphadenopathy:    She has no cervical adenopathy.  Neurological: She is alert and oriented to person, place, and time. She has normal reflexes. No cranial nerve deficit. Coordination normal.  tremor  Skin: Skin is warm and dry. No rash noted. She is not diaphoretic.  Psychiatric: Mood, memory and affect normal.    Lab Results  Component Value Date   TSH 1.10 01/04/2015   Lab Results  Component Value Date   WBC 6.0 01/04/2015   HGB 12.3 01/04/2015    HCT 36.7 01/04/2015   MCV 85.5 01/04/2015   PLT 216.0 01/04/2015   Lab Results  Component Value Date   CREATININE 0.71 01/04/2015   BUN 16 01/04/2015   NA 143 01/04/2015   K 3.9 01/04/2015   CL 107 01/04/2015   CO2 26 01/04/2015   Lab Results  Component Value Date   ALT 22 01/04/2015   AST 21 01/04/2015   ALKPHOS 63 01/04/2015   BILITOT 0.6 01/04/2015   Lab Results  Component Value Date   CHOL 176 01/04/2015   Lab Results  Component Value Date   HDL 55.90 01/04/2015  Lab Results  Component Value Date   LDLCALC 98 01/04/2015   Lab Results  Component Value Date   TRIG 112.0 01/04/2015   Lab Results  Component Value Date   CHOLHDL 3 01/04/2015     Assessment & Plan  Essential hypertension Well controlled, no changes to meds. Encouraged heart healthy diet such as the DASH diet and exercise as tolerated.    GERD Avoid offending foods, start probiotics. Do not eat large meals in late evening and consider raising head of bed.    Hyperlipidemia Tolerating statin, encouraged heart healthy diet, avoid trans fats, minimize simple carbs and saturated fats. Increase exercise as tolerated   Parkinson's disease Patient has proceeded with [facility] and has had the Parkinson's Diagnosis, she is comfortable now and is doing well. No changes to meds. Will continue to follow with Dr Tat   Obesity Encouraged DASH diet, decrease po intake and increase exercise as tolerated. Needs 7-8 hours of sleep nightly. Avoid trans fats, eat small, frequent meals every 4-5 hours with lean proteins, complex carbs and healthy fats. Minimize simple carbs, GMO foods.   Depression with anxiety Is willing to consider counseling, may continue meds for now   Hyperglycemia hgba1c acceptable, minimize simple carbs. Increase exercise as tolerated. Continue current meds

## 2015-02-03 ENCOUNTER — Encounter: Payer: Self-pay | Admitting: Neurology

## 2015-02-09 ENCOUNTER — Ambulatory Visit (INDEPENDENT_AMBULATORY_CARE_PROVIDER_SITE_OTHER): Payer: BC Managed Care – PPO | Admitting: Neurology

## 2015-02-09 ENCOUNTER — Encounter: Payer: Self-pay | Admitting: Neurology

## 2015-02-09 ENCOUNTER — Telehealth: Payer: Self-pay | Admitting: Neurology

## 2015-02-09 VITALS — BP 110/64 | HR 92 | Ht 65.0 in | Wt 233.0 lb

## 2015-02-09 DIAGNOSIS — G20 Parkinson's disease: Secondary | ICD-10-CM

## 2015-02-09 DIAGNOSIS — F418 Other specified anxiety disorders: Secondary | ICD-10-CM | POA: Diagnosis not present

## 2015-02-09 MED ORDER — PRAMIPEXOLE DIHYDROCHLORIDE 0.5 MG PO TABS: ORAL_TABLET | ORAL | Status: DC

## 2015-02-09 NOTE — Telephone Encounter (Signed)
Pt called and could not remember the name of the gym Dr Tat told her about for Parkinsons/Dawn CB# [phone]

## 2015-02-09 NOTE — Telephone Encounter (Signed)
Called patient and gave her the number to Irven Coe [phone] to contact about the boxing program. She will call with any additional questions.

## 2015-02-09 NOTE — Progress Notes (Signed)
Subjective:    Kristin [name] was seen in consultation in the movement disorder clinic at the request of Kristin Homans, MD.  The evaluation is for tremor.  The patient is a 64 y.o. left handed female with a history of tremor.  The records that were made available to me were reviewed.  According to records, the patient has complained about tremor to her previous primary care physician all the way back to 2012, but in those records she had said that tremor had been going on for years.  She states today, however, that she really thinks that it started in 2012.  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  R hand was always worse even though she is L hand dominant.  It has slowly progressed.  She reports that tremor is in both hands now (R still worse) but she feels tremor on the inside of the body all of the time.  She has tremor at rest now.  She feels nervous now and is unsure if it was related.  She also has the sweats and so she was placed on xanax but that doesn't help the sweating.  She recently went to an exercise class and noted that she wasn't as coordinated/strong with the R hand and that worried her and that is why she wanted a further evaluation.    She did see Dr. Krista [name] in June, 2013 and I reviewed that note.  She was diagnosed with essential tremor.  No treatment was recommended.  There is  family hx of tremor in her father.  She states that she has been on metoprolol since 2012 and didn't think that it helped but when she tried to get off of it a year ago, the tremor got worse and she couldn't get off of it.    03/09/14 update:  The patient presents today for follow up.  Today, she is accompanied by her husband who helps to supplement the history.  He was not present on the prior visit.  She was diagnosed with PD last visit.  I started her on Mirapex.  She has been doing better in terms of tremor.  Interestingly, sweats and sleep have been better.  She just  retired last week.  No side effects with the Mirapex.  She is currently taking it at 9 AM/2 PM/9 PM. No compulsive behaviors.  No sleep attacks.  She has been attending therapy.  She is planning on starting the Parkinson's exercise class.  She has been educating herself and reading patient education material.  She had a modified barium evaluation on 02/25/2014.  It was normal, although they suspected that she had mild esophageal dysphagia.  Her husband asks multiple questions today and asked about potentially having another opinion.  The patient has had no falls.  No hallucinations.  07/15/14 update:  Pt is f/u today, accompanied by her husband who supplements the history.  Pt is on mirapex 0.5 mg three times per day.  I stopped her metoprolol last visit. She did fine with that. She is exercising with the Moves class and with circuit II class.  She is also enrolled in the bike class at the [facility].  She is having some soreness because of the exercise and hip pain because of it and she has had a hip replacement and worries about that as she doesn't want to have another.  Thinking about trying chiropractics for that.  She asks me about some.  Stages that she has on the lateral aspect of the knees.  She does not wear boots.  She has not had a knee replacement.  She also complains of some achiness in her toes and it gets better as she walks.  No falls.  No lightheadedness.    11/11/14 update:  The patient returns today for follow-up.  She is on Mirapex, 0.5 mg 3 times per day.  Since our last visit, the patient did go to [facility] for a second opinion.  She saw Dr. Maxine [name].  This was just 2 days ago.  No notes are available.  They state that they had a good visit, are glad that they went and got a second opinion and no new recommendations/treatments were given.  She has been doing well at home.  She is very active in our community exercise programs for PD.  She is enrolled in the [facility] exercise program and in our circuit  class and is walking on the treadmill.  She notices tremor in public but generally not as much at home.  She notes wearing off of the medication but only when time for the next dosage.    02/09/15 update:  The patient is following up today regarding her Parkinson's disease.  She is accompanied by her husband who supplements the history.  She did get a second opinion at [facility] with Dr. Maxine [name] and his fellow, and Kristin [name], on 11/17/2014.  I reviewed those notes.  I told her that she could increase the Mirapex if needed to control tremor further.  She is currently on Mirapex 0.5 mg 3 times per day (7am/1:30 pm/7:00).  She is on Lexapro and Cymbalta for depression.  She is doing exercise.   She went to circuit class for PD today and she will have tremor when doing that.  She notes that when doing exercise, she has more tremor.  No falls.  She went to [place] for 2 weeks with a tour group and she felt that she was able to keep up with the group.  She felt better on this trip than she did 2 years ago on a trip.  Current/Previously tried tremor medications: metoprololol  Current medications that may exacerbate tremor:  Cymbalta, although I reviewed that the patient did try to change to Pristiq but the patient had nightmares with Pristiq and changed back to Cymbalta  Outside reports reviewed: historical medical records, lab reports and referral letter/letters.  Allergies  Allergen Reactions  . Bactrim [Sulfamethoxazole-Trimethoprim] Other (See Comments)    Oral ulcers and rash  . Penicillins Hives    Current Outpatient Prescriptions on File Prior to Visit  Medication Sig Dispense Refill  . ALPRAZolam (XANAX) 0.5 MG tablet Take 1 tablet (0.5 mg total) by mouth as needed for anxiety or sleep. 30 tablet 1  . bisacodyl (DULCOLAX) 5 MG EC tablet Take 5 mg by mouth daily as needed for moderate constipation.    . DULoxetine (CYMBALTA) 30 MG capsule Take 1 capsule (30 mg total) by mouth daily. 90 capsule 2  .  escitalopram (LEXAPRO) 20 MG tablet Take 1 tablet (20 mg total) by mouth daily. 90 tablet 3  . Krill Oil CAPS 1 krill oil caps daily, MegaRed caps by Schiff    . omeprazole (PRILOSEC) 20 MG capsule Take 1 capsule (20 mg total) by mouth every other day. 45 capsule 1  . rosuvastatin (CRESTOR) 10 MG tablet TAKE 1 TABLET BY MOUTH ON MONDAYS, WEDNESDAYS, AND FRIDAYS 90 tablet 2  .  telmisartan-hydrochlorothiazide (MICARDIS HCT) 80-25 MG per tablet 1/2 tab po daily 90 tablet 3   No current facility-administered medications on file prior to visit.    Past Medical History  Diagnosis Date  . Fibroids   . Abnormal vaginal Pap smear   . Anemia   . GERD (gastroesophageal reflux disease)   . Hyperlipidemia   . Hypertension   . Arthritis   . Anxiety   . Obesity   . Hiatal hernia   . Esophageal stricture 1994  . Chronic UTI     sees dr Terance Hart  . Measles as a child  . Chicken pox as a child  . 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 Depression with anxiety 11/02/2009    Qualifier: Diagnosis of  By: [name], LPN, [name]   . Plantar fasciitis of left foot 06/19/2012  . Dermatitis 07/17/2012  . Urinary frequency 02/25/2013  . Acute pharyngitis 02/25/2013  . Hyperglycemia 08/19/2013  . Visual floaters 05/11/2014  . Foot pain, bilateral 06/19/2012  . BCC (basal cell carcinoma of skin) 10/05/2014    On back  . Rosacea 10/05/2014    Past Surgical History  Procedure Laterality Date  . Wisdom teeth extracted    . Laporoscopy    . Tonsillectomy    . Abdominal hysterectomy  2006    total  . Partial hip arthroplasty  2010    right  . Esophageal      stretching    History   Social History  . Marital Status: Married    Spouse Name: N/A  . Number of Children: N/A  . Years of Education: N/A   Occupational History  . Not on file.   Social History Main Topics  . Smoking status: Never Smoker   . Smokeless tobacco: Never Used  . Alcohol Use: 2.4 oz/week    4 Glasses of wine per week      Comment: 4 glasses of wine weekly  . Drug Use: No  . Sexual Activity:    Partners: Male   Other Topics Concern  . Not on file   Social History Narrative    Family Status  Relation Status Death Age  . Mother Deceased 18    Dementia, breast cancer, bipolar  . Father Deceased 55    heart failure, rheumatoid arthritis  . Sister Alive     arthritis  . Sister Alive   . Daughter Alive     64  . Son Alive     8  . Maternal Grandmother Deceased   . Maternal Grandfather Deceased   . Paternal Grandmother Deceased   . Paternal Grandfather Deceased   . Sister Alive      healthy  . Sister Alive     healthy  . Daughter Alive     28/ healthy    Review of Systems A complete 10 system ROS was obtained and was negative apart from what is mentioned.   Objective:   VITALS:   Filed Vitals:   02/09/15 1038  BP: 110/64  Pulse: 92  Height: 5\' 5"  (1.651 m)  Weight: 233 lb (105.688 kg)   Wt Readings from Last 3 Encounters:  02/09/15 233 lb (105.688 kg)  01/04/15 234 lb 4 oz (106.255 kg)  11/11/14 234 lb (106.142 kg)     Gen:  Appears stated age and in NAD.  She is diaphoretic (room is also hot) HEENT:  Normocephalic, atraumatic. The mucous membranes are moist.  Cardiovascular: Regular rate and rhythm Lungs: Clear to auscultation bilaterally  Neck: No carotid bruits  NEUROLOGICAL:  Orientation: The patient is alert and oriented x3. Fund of knowledge is appropriate.  Recent and remote memory are intact.  Attention and concentration are normal.    Able to name objects and repeat phrases. Cranial nerves: There is good facial symmetry. The visual fields are full to confrontational testing. The speech is fluent and clear. Soft palate rises symmetrically and there is no tongue deviation. Hearing is intact to conversational tone. Sensation: Sensation is intact to light touch throughout. Motor: Strength is 5/5 in the bilateral upper and lower extremities.   Shoulder shrug is equal and  symmetric.  There is no pronator drift.   Movement examination: Tone: There is slight increased tone in the RUE.  Tone is normal in the RLE.   Tone in the left upper and left lower extremity was normal.  Abnormal movements: There is a intermittent right upper extremity resting tremor.   Coordination:  There is no significant decremation with rapid alternating movements in the upper or lower extremities. Gait and Station: The patient has no significant difficulty arising out of a deep-seated chair without the use of the hands. The patient's stride length is fairly normal, but there is  decreased arm swing on the right.  The patient has a negative pull test.      LABS  Lab Results  Component Value Date   TSH 1.10 01/04/2015   Lab Results  Component Value Date   WBC 6.0 01/04/2015   HGB 12.3 01/04/2015   HCT 36.7 01/04/2015   MCV 85.5 01/04/2015   PLT 216.0 01/04/2015     Chemistry      Component Value Date/Time   NA 143 01/04/2015 1046   K 3.9 01/04/2015 1046   CL 107 01/04/2015 1046   CO2 26 01/04/2015 1046   BUN 16 01/04/2015 1046   CREATININE 0.71 01/04/2015 1046   CREATININE 0.8 05/05/2014 0948      Component Value Date/Time   CALCIUM 9.2 01/04/2015 1046   ALKPHOS 63 01/04/2015 1046   AST 21 01/04/2015 1046   ALT 22 01/04/2015 1046   BILITOT 0.6 01/04/2015 1046          Assessment/Plan:   1. idiopathic Parkinson's disease.  The patient has tremor, bradykinesia, rigidity and mild postural instability.  -Increase pramipexole to 1 mg in the morning and continue the 0.5 mg in the afternoon and evening.  She has had no compulsive side effects.   Risks, benefits, side effects and alternative therapies were discussed.  The opportunity to ask questions was given and they were answered to the best of my ability.  The patient expressed understanding and willingness to follow the outlined treatment protocols.  -I encouraged her to continue the cardiovascular exercise.  She is  really doing a great job in this regard.  I gave her information on the Parkinson's boxing program.  -we discussed safety.  Greater than 50% of the 40 minute visit in counseling. 2.  Depression  -This certainly could be associated with Parkinson's disease, or could be independent of it.  She is on a combination of Lexapro and Cymbalta and she is doing well.   3.  Follow-up in the next 3 months, sooner should new neurologic issues arise.

## 2015-02-11 ENCOUNTER — Ambulatory Visit: Payer: BC Managed Care – PPO | Admitting: Neurology

## 2015-04-05 ENCOUNTER — Other Ambulatory Visit: Payer: Self-pay | Admitting: Family Medicine

## 2015-04-05 NOTE — Telephone Encounter (Signed)
This was discontinued please check with patient and see if she still wants to take this, OK to refill for 6 months if she would like.

## 2015-04-05 NOTE — Telephone Encounter (Signed)
Confirm request as medication is not on current medication list.

## 2015-04-06 NOTE — Telephone Encounter (Signed)
I did call the patient and she does not want this refilled as she is not taking any more as discussed at her last visit.  I did refuse then the refill and reason stated patient no longer taking .

## 2015-05-12 ENCOUNTER — Ambulatory Visit (INDEPENDENT_AMBULATORY_CARE_PROVIDER_SITE_OTHER): Payer: Medicare Other | Admitting: Neurology

## 2015-05-12 ENCOUNTER — Encounter: Payer: Self-pay | Admitting: Neurology

## 2015-05-12 VITALS — BP 116/72 | HR 85 | Ht 64.0 in | Wt 230.0 lb

## 2015-05-12 DIAGNOSIS — G20 Parkinson's disease: Secondary | ICD-10-CM | POA: Diagnosis not present

## 2015-05-12 DIAGNOSIS — F418 Other specified anxiety disorders: Secondary | ICD-10-CM

## 2015-05-12 NOTE — Progress Notes (Signed)
Subjective:    Kristin [name] was seen in consultation in the movement disorder clinic at the request of [name], MD.  The evaluation is for tremor.  The patient is a 64 y.o. left handed female with a history of tremor.  The records that were made available to me were reviewed.  According to records, the patient has complained about tremor to her previous primary care physician all the way back to 2012, but in those records she had said that tremor had been going on for years.  She states today, however, that she really thinks that it started in 2012.  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  R hand was always worse even though she is L hand dominant.  It has slowly progressed.  She reports that tremor is in both hands now (R still worse) but she feels tremor on the inside of the body all of the time.  She has tremor at rest now.  She feels nervous now and is unsure if it was related.  She also has the sweats and so she was placed on xanax but that doesn't help the sweating.  She recently went to an exercise class and noted that she wasn't as coordinated/strong with the R hand and that worried her and that is why she wanted a further evaluation.    She did see [name] in June, 2013 and I reviewed that note.  She was diagnosed with essential tremor.  No treatment was recommended.  There is  family hx of tremor in her father.  She states that she has been on metoprolol since 2012 and didn't think that it helped but when she tried to get off of it a year ago, the tremor got worse and she couldn't get off of it.    03/09/14 update:  The patient presents today for follow up.  Today, she is accompanied by her husband who helps to supplement the history.  He was not present on the prior visit.  She was diagnosed with PD last visit.  I started her on Mirapex.  She has been doing better in terms of tremor.  Interestingly, sweats and sleep have been better.  She just  retired last week.  No side effects with the Mirapex.  She is currently taking it at 9 AM/2 PM/9 PM. No compulsive behaviors.  No sleep attacks.  She has been attending therapy.  She is planning on starting the Parkinson's exercise class.  She has been educating herself and reading patient education material.  She had a modified barium evaluation on 02/25/2014.  It was normal, although they suspected that she had mild esophageal dysphagia.  Her husband asks multiple questions today and asked about potentially having another opinion.  The patient has had no falls.  No hallucinations.  07/15/14 update:  Pt is f/u today, accompanied by her husband who supplements the history.  Pt is on mirapex 0.5 mg three times per day.  I stopped her metoprolol last visit. She did fine with that. She is exercising with the Moves class and with circuit II class.  She is also enrolled in the bike class at the [facility].  She is having some soreness because of the exercise and hip pain because of it and she has had a hip replacement and worries about that as she doesn't want to have another.  Thinking about trying chiropractics for that.  She asks me about some.  Stages that she has on the lateral aspect of the knees.  She does not wear boots.  She has not had a knee replacement.  She also complains of some achiness in her toes and it gets better as she walks.  No falls.  No lightheadedness.    11/11/14 update:  The patient returns today for follow-up.  She is on Mirapex, 0.5 mg 3 times per day.  Since our last visit, the patient did go to [facility] for a second opinion.  She saw Dr. [name].  This was just 2 days ago.  No notes are available.  They state that they had a good visit, are glad that they went and got a second opinion and no new recommendations/treatments were given.  She has been doing well at home.  She is very active in our community exercise programs for PD.  She is enrolled in the [facility] exercise program and in our circuit  class and is walking on the treadmill.  She notices tremor in public but generally not as much at home.  She notes wearing off of the medication but only when time for the next dosage.    02/09/15 update:  The patient is following up today regarding her Parkinson's disease.  She is accompanied by her husband who supplements the history.  She did get a second opinion at [facility] with Dr. [name] and his fellow, and [name], on 11/17/2014.  I reviewed those notes.  I told her that she could increase the Mirapex if needed to control tremor further.  She is currently on Mirapex 0.5 mg 3 times per day (7am/1:30 pm/7:00).  She is on Lexapro and Cymbalta for depression.  She is doing exercise.   She went to circuit class for PD today and she will have tremor when doing that.  She notes that when doing exercise, she has more tremor.  No falls.  She went to [place] for 2 weeks with a tour group and she felt that she was able to keep up with the group.  She felt better on this trip than she did 2 years ago on a trip.  05/12/15 update:  The patient falls up today regarding her Parkinson's disease. She is accompanied by her husband who supplements the history.   Last visit, I had her increase her Mirapex to 1 mg in the morning and she remained on 0.5 mg in the afternoon and 0.5 mg in the evening. She was initially tired with the increase in the medication but has adjusted now.  Her daughter and 33 month old grandson are living with them.  She is exercising on the treadmill.  She is doing the Parkinsons exercise class.  She is going to try the boxing class and is in the [employer] class and it is increasing to 2 days a week. She has been doing well with mood on a combination of Lexapro and Cymbalta.  She denies any falls.  No lightheadedness or near syncope.  No hallucinations.  Current/Previously tried tremor medications: metoprololol  Current medications that may exacerbate tremor:  Cymbalta, although I reviewed that the patient did  try to change to Pristiq but the patient had nightmares with Pristiq and changed back to Cymbalta  Outside reports reviewed: historical medical records, lab reports and referral letter/letters.  Allergies  Allergen Reactions  . Bactrim [Sulfamethoxazole-Trimethoprim] Other (See Comments)    Oral ulcers and rash  . Penicillins Hives    Current Outpatient Prescriptions on File Prior to  Visit  Medication Sig Dispense Refill  . bisacodyl (DULCOLAX) 5 MG EC tablet Take 5 mg by mouth daily as needed for moderate constipation.    . DULoxetine (CYMBALTA) 30 MG capsule Take 1 capsule (30 mg total) by mouth daily. 90 capsule 2  . escitalopram (LEXAPRO) 20 MG tablet Take 1 tablet (20 mg total) by mouth daily. 90 tablet 3  . Krill Oil CAPS 1 krill oil caps daily, MegaRed caps by Schiff    . omeprazole (PRILOSEC) 20 MG capsule Take 1 capsule (20 mg total) by mouth every other day. 45 capsule 1  . pramipexole (MIRAPEX) 0.5 MG tablet Take 2 tablets in the morning, 1 tablet in the afternoon, 1 in the evening 360 tablet 3  . rosuvastatin (CRESTOR) 10 MG tablet TAKE 1 TABLET BY MOUTH ON MONDAYS, WEDNESDAYS, AND FRIDAYS 90 tablet 2  . telmisartan-hydrochlorothiazide (MICARDIS HCT) 80-25 MG per tablet 1/2 tab po daily 90 tablet 3  . ALPRAZolam (XANAX) 0.5 MG tablet Take 1 tablet (0.5 mg total) by mouth as needed for anxiety or sleep. (Patient not taking: Reported on 05/12/2015) 30 tablet 1   No current facility-administered medications on file prior to visit.    Past Medical History  Diagnosis Date  . Fibroids   . Abnormal vaginal Pap smear   . Anemia   . GERD (gastroesophageal reflux disease)   . Hyperlipidemia   . Hypertension   . Arthritis   . Anxiety   . Obesity   . Hiatal hernia   . Esophageal stricture 1994  . Chronic UTI     sees dr Terance Hart  . Measles as a child  . Chicken pox as a child  . 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 Depression with anxiety 11/02/2009    Qualifier: Diagnosis of   By: [name], LPN, [name]   . Plantar fasciitis of left foot 06/19/2012  . Dermatitis 07/17/2012  . Urinary frequency 02/25/2013  . Acute pharyngitis 02/25/2013  . Hyperglycemia 08/19/2013  . Visual floaters 05/11/2014  . Foot pain, bilateral 06/19/2012  . BCC (basal cell carcinoma of skin) 10/05/2014    On back  . Rosacea 10/05/2014    Past Surgical History  Procedure Laterality Date  . Wisdom teeth extracted    . Laporoscopy    . Tonsillectomy    . Abdominal hysterectomy  2006    total  . Partial hip arthroplasty  2010    right  . Esophageal      stretching    Social History   Social History  . Marital Status: Married    Spouse Name: N/A  . Number of Children: N/A  . Years of Education: N/A   Occupational History  . Not on file.   Social History Main Topics  . Smoking status: Never Smoker   . Smokeless tobacco: Never Used  . Alcohol Use: 2.4 oz/week    4 Glasses of wine per week     Comment: 4 glasses of wine weekly  . Drug Use: No  . Sexual Activity:    Partners: Male   Other Topics Concern  . Not on file   Social History Narrative    Family Status  Relation Status Death Age  . Mother Deceased 61    Dementia, breast cancer, bipolar  . Father Deceased 22    heart failure, rheumatoid arthritis  . Sister Alive     arthritis  . Sister Alive   . Daughter Alive     77  . Son  Alive     30  . Maternal Grandmother Deceased   . Maternal Grandfather Deceased   . Paternal Grandmother Deceased   . Paternal Grandfather Deceased   . Sister Alive      healthy  . Sister Alive     healthy  . Daughter Alive     28/ healthy    Review of Systems A complete 10 system ROS was obtained and was negative apart from what is mentioned.   Objective:   VITALS:   Filed Vitals:   05/12/15 1010  BP: 116/72  Pulse: 85  Height: 5\' 4"  (1.626 m)  Weight: 230 lb (104.327 kg)   Wt Readings from Last 3 Encounters:  05/12/15 230 lb (104.327 kg)  02/09/15 233 lb  (105.688 kg)  01/04/15 234 lb 4 oz (106.255 kg)     Gen:  Appears stated age and in NAD.  She is diaphoretic (room is also hot) HEENT:  Normocephalic, atraumatic. The mucous membranes are moist.  Cardiovascular: Regular rate and rhythm Lungs: Clear to auscultation bilaterally Neck: No carotid bruits  NEUROLOGICAL:  Orientation: The patient is alert and oriented x3. Fund of knowledge is appropriate.  Recent and remote memory are intact.  Attention and concentration are normal.    Able to name objects and repeat phrases. Cranial nerves: There is good facial symmetry. The visual fields are full to confrontational testing. The speech is fluent and clear. Soft palate rises symmetrically and there is no tongue deviation. Hearing is intact to conversational tone. Sensation: Sensation is intact to light touch throughout. Motor: Strength is 5/5 in the bilateral upper and lower extremities.   Shoulder shrug is equal and symmetric.  There is no pronator drift.   Movement examination: Tone: There is minimal increased tone in the RUE.  Tone is normal in the RLE.   Tone in the left upper and left lower extremity was normal.  Abnormal movements: There is a intermittent right upper extremity resting tremor.   Coordination:  There is no significant decremation with rapid alternating movements in the upper or lower extremities. Gait and Station: The patient has no significant difficulty arising out of a deep-seated chair without the use of the hands. The patient's stride length is fairly normal, but there is  decreased arm swing on the right.  The patient has a negative pull test.      LABS  Lab Results  Component Value Date   TSH 1.10 01/04/2015   Lab Results  Component Value Date   WBC 6.0 01/04/2015   HGB 12.3 01/04/2015   HCT 36.7 01/04/2015   MCV 85.5 01/04/2015   PLT 216.0 01/04/2015     Chemistry      Component Value Date/Time   NA 143 01/04/2015 1046   K 3.9 01/04/2015 1046   CL 107  01/04/2015 1046   CO2 26 01/04/2015 1046   BUN 16 01/04/2015 1046   CREATININE 0.71 01/04/2015 1046   CREATININE 0.8 05/05/2014 0948      Component Value Date/Time   CALCIUM 9.2 01/04/2015 1046   ALKPHOS 63 01/04/2015 1046   AST 21 01/04/2015 1046   ALT 22 01/04/2015 1046   BILITOT 0.6 01/04/2015 1046          Assessment/Plan:   1. idiopathic Parkinson's disease.  The patient has tremor, bradykinesia, rigidity and mild postural instability.  -continue pramipexole to 1 mg in the morning and continue the 0.5 mg in the afternoon and evening.  She has had no  compulsive side effects.   Risks, benefits, side effects and alternative therapies were discussed.  The opportunity to ask questions was given and they were answered to the best of my ability.  The patient expressed understanding and willingness to follow the outlined treatment protocols.  -I encouraged her to continue the cardiovascular exercise.  She is really doing a great job in this regard.    -we discussed safety.  Greater than 50% of the 30 minute visit in counseling. 2.  Depression  -This certainly could be associated with Parkinson's disease, or could be independent of it.  She is on a combination of Lexapro and Cymbalta and she is doing well.   3.  Follow-up in the next 3 months, sooner should new neurologic issues arise.

## 2015-05-13 ENCOUNTER — Ambulatory Visit: Payer: Medicare Other | Attending: Neurology | Admitting: Physical Therapy

## 2015-05-13 ENCOUNTER — Ambulatory Visit: Payer: Medicare Other

## 2015-05-13 ENCOUNTER — Ambulatory Visit: Payer: Medicare Other | Admitting: Occupational Therapy

## 2015-05-13 DIAGNOSIS — R258 Other abnormal involuntary movements: Secondary | ICD-10-CM

## 2015-05-13 DIAGNOSIS — R279 Unspecified lack of coordination: Secondary | ICD-10-CM | POA: Insufficient documentation

## 2015-05-13 DIAGNOSIS — R490 Dysphonia: Secondary | ICD-10-CM

## 2015-05-13 DIAGNOSIS — G20 Parkinson's disease: Secondary | ICD-10-CM

## 2015-05-13 DIAGNOSIS — R269 Unspecified abnormalities of gait and mobility: Secondary | ICD-10-CM

## 2015-05-13 NOTE — Therapy (Signed)
[facility] [address] [place], [place], [address] Phone: [phone]   Fax:  [phone]  Patient Details  Name: Kristin [name] MRN: [id number] Date of Birth: [birth year] Referring Provider:  Mosie Lukes, MD  Encounter Date: 05/13/2015  Physical Therapy Parkinson's Disease Screen   Timed Up and Go test:8.80 seconds  10 meter walk test: 9.09 sec (3.6 ft/sec)  5 time sit to stand test:10.59 sec    Patient does not require Physical Therapy services at this time.  Recommend Physical Therapy screen in 6 months.  Pt is exercising frequently and feels she is stronger and moving better.  No PT recommended at this time.   [name] 05/13/2015, 8:23 AM Frazier Butt., PT [facility] [address] [place], [place], [address] Phone: [phone]   Fax:  [phone]

## 2015-05-13 NOTE — Therapy (Signed)
[facility] [address] [place], [place], [address] Phone: [phone]   Fax:  [phone]  Patient Details  Name: Kristin [name] MRN: [id number] Date of Birth: 11/21/1950 Referring Provider:  Ludwig Clarks, DO  Encounter Date: 05/13/2015  Speech Therapy Parkinson's Disease Screen   Decibel Level today: 70dB  (WNL=70-72 dB) with sound level meter 30cm away from pt's mouth, in 10 minutes mod complex conversation. Pt's conversational volume has remained WNL since last treatment course  Pt would benefit from the following speech therapy recommendations: Speech-language eval for dysarthria  Pt does does not require speech therapy services at this time. Recommend ST screen in another 3-6 months    [facility] 05/13/2015, 8:14 AM  [facility] [address] [place], [place], [address] Phone: [phone]   Fax:  [phone]

## 2015-05-13 NOTE — Therapy (Signed)
[facility] [address] [place], [place], [address] Phone: [phone]   Fax:  [phone]  Patient Details  Name: Kristin [name] MRN: [id number] Date of Birth: 1951/01/08 Referring Provider:  Mosie Lukes, MD  Encounter Date: 05/13/2015  Occupational Therapy Parkinson's Disease Screen  9-hole peg test:    RUE  23.97        LUE  20.28sec  Box & Blocks Test:   RUE  48 blocks        LUE 61 blocks  Change in ability to perform ADLs/IADLs:  no  Other Comments:  Briefly verbally reviewed coordination HEP and recommended pt resume previous HEP.  Pt verbalized agreement.  Pt does not require occupation therapy services at this time.  Recommended occupational therapy screen in   approx 6 months.    Vianne Bulls, OTR/L 05/13/2015 9:04 AM    [facility] [address] [place], [place], [address] Phone: [phone]   Fax:  [phone]

## 2015-05-19 DIAGNOSIS — Z23 Encounter for immunization: Secondary | ICD-10-CM | POA: Diagnosis not present

## 2015-07-09 ENCOUNTER — Telehealth: Payer: Self-pay | Admitting: Neurology

## 2015-07-09 NOTE — Telephone Encounter (Signed)
Patient made aware power moved retreat application approved and faxed to [phone] with confirmation received.

## 2015-08-11 ENCOUNTER — Ambulatory Visit: Payer: No Typology Code available for payment source | Admitting: Neurology

## 2015-08-12 ENCOUNTER — Ambulatory Visit (INDEPENDENT_AMBULATORY_CARE_PROVIDER_SITE_OTHER): Payer: Medicare Other | Admitting: Neurology

## 2015-08-12 ENCOUNTER — Encounter: Payer: Self-pay | Admitting: Neurology

## 2015-08-12 VITALS — BP 118/80 | HR 72 | Ht 65.0 in | Wt 231.0 lb

## 2015-08-12 DIAGNOSIS — F418 Other specified anxiety disorders: Secondary | ICD-10-CM | POA: Diagnosis not present

## 2015-08-12 DIAGNOSIS — G20A1 Parkinson's disease without dyskinesia, without mention of fluctuations: Secondary | ICD-10-CM

## 2015-08-12 DIAGNOSIS — G20 Parkinson's disease: Secondary | ICD-10-CM | POA: Diagnosis not present

## 2015-08-12 NOTE — Progress Notes (Signed)
Subjective:    Kristin [name] was seen in consultation in the movement disorder clinic at the request of [name], MD.  The evaluation is for tremor.  The patient is a 64 y.o. left handed female with a history of tremor.  The records that were made available to me were reviewed.  According to records, the patient has complained about tremor to her previous primary care physician all the way back to 2012, but in those records she had said that tremor had been going on for years.  She states today, however, that she really thinks that it started in 2012.  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  R hand was always worse even though she is L hand dominant.  It has slowly progressed.  She reports that tremor is in both hands now (R still worse) but she feels tremor on the inside of the body all of the time.  She has tremor at rest now.  She feels nervous now and is unsure if it was related.  She also has the sweats and so she was placed on xanax but that doesn't help the sweating.  She recently went to an exercise class and noted that she wasn't as coordinated/strong with the R hand and that worried her and that is why she wanted a further evaluation.    She did see [name] in June, 2013 and I reviewed that note.  She was diagnosed with essential tremor.  No treatment was recommended.  There is  family hx of tremor in her father.  She states that she has been on metoprolol since 2012 and didn't think that it helped but when she tried to get off of it a year ago, the tremor got worse and she couldn't get off of it.    03/09/14 update:  The patient presents today for follow up.  Today, she is accompanied by her husband who helps to supplement the history.  He was not present on the prior visit.  She was diagnosed with PD last visit.  I started her on Mirapex.  She has been doing better in terms of tremor.  Interestingly, sweats and sleep have been better.  She just  retired last week.  No side effects with the Mirapex.  She is currently taking it at 9 AM/2 PM/9 PM. No compulsive behaviors.  No sleep attacks.  She has been attending therapy.  She is planning on starting the Parkinson's exercise class.  She has been educating herself and reading patient education material.  She had a modified barium evaluation on 02/25/2014.  It was normal, although they suspected that she had mild esophageal dysphagia.  Her husband asks multiple questions today and asked about potentially having another opinion.  The patient has had no falls.  No hallucinations.  07/15/14 update:  Pt is f/u today, accompanied by her husband who supplements the history.  Pt is on mirapex 0.5 mg three times per day.  I stopped her metoprolol last visit. She did fine with that. She is exercising with the Moves class and with circuit II class.  She is also enrolled in the bike class at the [facility].  She is having some soreness because of the exercise and hip pain because of it and she has had a hip replacement and worries about that as she doesn't want to have another.  Thinking about trying chiropractics for that.  She asks me about some.  Stages that she has on the lateral aspect of the knees.  She does not wear boots.  She has not had a knee replacement.  She also complains of some achiness in her toes and it gets better as she walks.  No falls.  No lightheadedness.    11/11/14 update:  The patient returns today for follow-up.  She is on Mirapex, 0.5 mg 3 times per day.  Since our last visit, the patient did go to [facility] for a second opinion.  She saw Dr. [name].  This was just 2 days ago.  No notes are available.  They state that they had a good visit, are glad that they went and got a second opinion and no new recommendations/treatments were given.  She has been doing well at home.  She is very active in our community exercise programs for PD.  She is enrolled in the [facility] exercise program and in our circuit  class and is walking on the treadmill.  She notices tremor in public but generally not as much at home.  She notes wearing off of the medication but only when time for the next dosage.    02/09/15 update:  The patient is following up today regarding her Parkinson's disease.  She is accompanied by her husband who supplements the history.  She did get a second opinion at [facility] with Dr. [name] and his fellow, and [name], on 11/17/2014.  I reviewed those notes.  I told her that she could increase the Mirapex if needed to control tremor further.  She is currently on Mirapex 0.5 mg 3 times per day (7am/1:30 pm/7:00).  She is on Lexapro and Cymbalta for depression.  She is doing exercise.   She went to circuit class for PD today and she will have tremor when doing that.  She notes that when doing exercise, she has more tremor.  No falls.  She went to [place] for 2 weeks with a tour group and she felt that she was able to keep up with the group.  She felt better on this trip than she did 2 years ago on a trip.  05/12/15 update:  The patient falls up today regarding her Parkinson's disease. She is accompanied by her husband who supplements the history.   Last visit, I had her increase her Mirapex to 1 mg in the morning and she remained on 0.5 mg in the afternoon and 0.5 mg in the evening. She was initially tired with the increase in the medication but has adjusted now.  Her daughter and 3 month old grandson are living with them.  She is exercising on the treadmill.  She is doing the Parkinsons exercise class.  She is going to try the boxing class and is in the [employer] class and it is increasing to 2 days a week. She has been doing well with mood on a combination of Lexapro and Cymbalta.  She denies any falls.  No lightheadedness or near syncope.  No hallucinations.  08/12/15 update:  The patient follows up today, accompanied by her husband who supplements the history.  The patient is on pramipexole, 1.0 mg in the morning, 0.5  mg in the afternoon and 0.5 mg in the evening.  She is also planning on attending the [facility] retreat as a patient, which should be very good for her.  She remains on Lexapro and Cymbalta.  They're working well for her.  She is thinking about getting a therapist and asks me about  that.  She also asks me about possible supplements for Parkinson's disease.  She continues to exercise a lot.  She has not had any falls.  No dyskinesia.  No lightheadedness or near syncope.  Current/Previously tried tremor medications: metoprololol  Current medications that may exacerbate tremor:  Cymbalta, although I reviewed that the patient did try to change to Pristiq but the patient had nightmares with Pristiq and changed back to Cymbalta  Outside reports reviewed: historical medical records, lab reports and referral letter/letters.  Allergies  Allergen Reactions  . Bactrim [Sulfamethoxazole-Trimethoprim] Other (See Comments)    Oral ulcers and rash  . Penicillins Hives    Current Outpatient Prescriptions on File Prior to Visit  Medication Sig Dispense Refill  . ALPRAZolam (XANAX) 0.5 MG tablet Take 1 tablet (0.5 mg total) by mouth as needed for anxiety or sleep. 30 tablet 1  . bisacodyl (DULCOLAX) 5 MG EC tablet Take 5 mg by mouth daily as needed for moderate constipation.    . DULoxetine (CYMBALTA) 30 MG capsule Take 1 capsule (30 mg total) by mouth daily. 90 capsule 2  . escitalopram (LEXAPRO) 20 MG tablet Take 1 tablet (20 mg total) by mouth daily. 90 tablet 3  . Krill Oil CAPS 1 krill oil caps daily, MegaRed caps by Schiff    . omeprazole (PRILOSEC) 20 MG capsule Take 1 capsule (20 mg total) by mouth every other day. 45 capsule 1  . pramipexole (MIRAPEX) 0.5 MG tablet Take 2 tablets in the morning, 1 tablet in the afternoon, 1 in the evening 360 tablet 3  . rosuvastatin (CRESTOR) 10 MG tablet TAKE 1 TABLET BY MOUTH ON MONDAYS, WEDNESDAYS, AND FRIDAYS 90 tablet 2  . telmisartan-hydrochlorothiazide (MICARDIS  HCT) 80-25 MG per tablet 1/2 tab po daily 90 tablet 3   No current facility-administered medications on file prior to visit.    Past Medical History  Diagnosis Date  . Fibroids   . Abnormal vaginal Pap smear   . Anemia   . GERD (gastroesophageal reflux disease)   . Hyperlipidemia   . Hypertension   . Arthritis   . Anxiety   . Obesity   . Hiatal hernia   . Esophageal stricture 1994  . Chronic UTI     sees dr Terance Hart  . Measles as a child  . Chicken pox as a child  . 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 Depression with anxiety 11/02/2009    Qualifier: Diagnosis of  By: [name], LPN, [name]   . Plantar fasciitis of left foot 06/19/2012  . Dermatitis 07/17/2012  . Urinary frequency 02/25/2013  . Acute pharyngitis 02/25/2013  . Hyperglycemia 08/19/2013  . Visual floaters 05/11/2014  . Foot pain, bilateral 06/19/2012  . BCC (basal cell carcinoma of skin) 10/05/2014    On back  . Rosacea 10/05/2014    Past Surgical History  Procedure Laterality Date  . Wisdom teeth extracted    . Laporoscopy    . Tonsillectomy    . Abdominal hysterectomy  2006    total  . Partial hip arthroplasty  2010    right  . Esophageal      stretching    Social History   Social History  . Marital Status: Married    Spouse Name: N/A  . Number of Children: N/A  . Years of Education: N/A   Occupational History  . Not on file.   Social History Main Topics  . Smoking status: Never Smoker   . Smokeless tobacco: Never Used  .  Alcohol Use: 2.4 oz/week    4 Glasses of wine per week     Comment: 4 glasses of wine weekly  . Drug Use: No  . Sexual Activity:    Partners: Male   Other Topics Concern  . Not on file   Social History Narrative    Family Status  Relation Status Death Age  . Mother Deceased 13    Dementia, breast cancer, bipolar  . Father Deceased 42    heart failure, rheumatoid arthritis  . Sister Alive     arthritis  . Sister Alive   . Daughter Alive     72  . Son Alive      41  . Maternal Grandmother Deceased   . Maternal Grandfather Deceased   . Paternal Grandmother Deceased   . Paternal Grandfather Deceased   . Sister Alive      healthy  . Sister Alive     healthy  . Daughter Alive     28/ healthy    Review of Systems A complete 10 system ROS was obtained and was negative apart from what is mentioned.   Objective:   VITALS:   Filed Vitals:   08/12/15 0831  BP: 118/80  Pulse: 72  Height: 5\' 5"  (1.651 m)  Weight: 231 lb (104.781 kg)   Wt Readings from Last 3 Encounters:  08/12/15 231 lb (104.781 kg)  05/12/15 230 lb (104.327 kg)  02/09/15 233 lb (105.688 kg)     Gen:  Appears stated age and in NAD.  She is diaphoretic (room is also hot) HEENT:  Normocephalic, atraumatic. The mucous membranes are moist.  Cardiovascular: Regular rate and rhythm Lungs: Clear to auscultation bilaterally Neck: No carotid bruits  NEUROLOGICAL:  Orientation: The patient is alert and oriented x3. Fund of knowledge is appropriate.  Recent and remote memory are intact.  Attention and concentration are normal.    Able to name objects and repeat phrases. Cranial nerves: There is good facial symmetry. The visual fields are full to confrontational testing. The speech is fluent and clear. Soft palate rises symmetrically and there is no tongue deviation. Hearing is intact to conversational tone. Sensation: Sensation is intact to light touch throughout. Motor: Strength is 5/5 in the bilateral upper and lower extremities.   Shoulder shrug is equal and symmetric.  There is no pronator drift.   Movement examination: Tone: There is minimal increased tone in the RUE.  Tone is normal in the RLE.   Tone in the left upper and left lower extremity was normal.  Abnormal movements: There is a intermittent right upper extremity resting tremor.   Coordination:  There is no significant decremation with rapid alternating movements in the upper or lower extremities. Gait and  Station: The patient has no significant difficulty arising out of a deep-seated chair without the use of the hands. The patient's stride length is fairly normal, but there is  decreased arm swing on the right.  The patient has a negative pull test.      LABS  Lab Results  Component Value Date   TSH 1.10 01/04/2015   Lab Results  Component Value Date   WBC 6.0 01/04/2015   HGB 12.3 01/04/2015   HCT 36.7 01/04/2015   MCV 85.5 01/04/2015   PLT 216.0 01/04/2015     Chemistry      Component Value Date/Time   NA 143 01/04/2015 1046   K 3.9 01/04/2015 1046   CL 107 01/04/2015 1046   CO2  26 01/04/2015 1046   BUN 16 01/04/2015 1046   CREATININE 0.71 01/04/2015 1046   CREATININE 0.8 05/05/2014 0948      Component Value Date/Time   CALCIUM 9.2 01/04/2015 1046   ALKPHOS 63 01/04/2015 1046   AST 21 01/04/2015 1046   ALT 22 01/04/2015 1046   BILITOT 0.6 01/04/2015 1046          Assessment/Plan:   1. idiopathic Parkinson's disease.  The patient has tremor, bradykinesia, rigidity and mild postural instability.  -continue pramipexole to 1 mg in the morning and continue the 0.5 mg in the afternoon and evening.  She has had no compulsive side effects.  This was refilled today.  Risks, benefits, side effects and alternative therapies were discussed.  The opportunity to ask questions was given and they were answered to the best of my ability.  The patient expressed understanding and willingness to follow the outlined treatment protocols.  -I encouraged her to continue the cardiovascular exercise.  She is really doing a great job in this regard.    -we discussed safety.  Greater than 50% of the 30 minute visit in counseling.  -She asked me about supplements in Parkinson's disease and I did not particularly recommend any. 2.  Depression  -This certainly could be associated with Parkinson's disease, or could be independent of it.  She is on a combination of Lexapro and Cymbalta and she is doing  well.    -I talked to her about potentially seeing Amada Jupiter. 3.  Follow-up in the next 3 months, sooner should new neurologic issues arise.

## 2015-10-05 DIAGNOSIS — Z23 Encounter for immunization: Secondary | ICD-10-CM | POA: Diagnosis not present

## 2015-10-05 DIAGNOSIS — L821 Other seborrheic keratosis: Secondary | ICD-10-CM | POA: Diagnosis not present

## 2015-10-05 DIAGNOSIS — Z85828 Personal history of other malignant neoplasm of skin: Secondary | ICD-10-CM | POA: Diagnosis not present

## 2015-10-05 DIAGNOSIS — L814 Other melanin hyperpigmentation: Secondary | ICD-10-CM | POA: Diagnosis not present

## 2015-10-05 DIAGNOSIS — D225 Melanocytic nevi of trunk: Secondary | ICD-10-CM | POA: Diagnosis not present

## 2015-10-05 DIAGNOSIS — L719 Rosacea, unspecified: Secondary | ICD-10-CM | POA: Diagnosis not present

## 2015-10-05 DIAGNOSIS — Z808 Family history of malignant neoplasm of other organs or systems: Secondary | ICD-10-CM | POA: Diagnosis not present

## 2015-10-05 DIAGNOSIS — D1801 Hemangioma of skin and subcutaneous tissue: Secondary | ICD-10-CM | POA: Diagnosis not present

## 2015-10-07 ENCOUNTER — Other Ambulatory Visit: Payer: Self-pay | Admitting: Family Medicine

## 2015-11-08 DIAGNOSIS — G20 Parkinson's disease: Secondary | ICD-10-CM | POA: Diagnosis not present

## 2015-11-08 DIAGNOSIS — R251 Tremor, unspecified: Secondary | ICD-10-CM | POA: Diagnosis not present

## 2015-11-19 ENCOUNTER — Other Ambulatory Visit: Payer: Self-pay

## 2015-11-19 DIAGNOSIS — Z1231 Encounter for screening mammogram for malignant neoplasm of breast: Secondary | ICD-10-CM

## 2015-11-26 ENCOUNTER — Ambulatory Visit
Admission: RE | Admit: 2015-11-26 | Discharge: 2015-11-26 | Disposition: A | Discharge status: Home / Self Care | Payer: Medicare Other | Source: Ambulatory Visit

## 2015-11-26 DIAGNOSIS — Z1231 Encounter for screening mammogram for malignant neoplasm of breast: Secondary | ICD-10-CM

## 2015-12-01 ENCOUNTER — Ambulatory Visit (INDEPENDENT_AMBULATORY_CARE_PROVIDER_SITE_OTHER): Payer: Medicare Other | Admitting: Neurology

## 2015-12-01 ENCOUNTER — Encounter: Payer: Self-pay | Admitting: Neurology

## 2015-12-01 VITALS — BP 140/80 | HR 78 | Ht 65.0 in | Wt 232.0 lb

## 2015-12-01 DIAGNOSIS — F418 Other specified anxiety disorders: Secondary | ICD-10-CM | POA: Diagnosis not present

## 2015-12-01 DIAGNOSIS — G20 Parkinson's disease: Secondary | ICD-10-CM | POA: Diagnosis not present

## 2015-12-01 NOTE — Progress Notes (Signed)
Subjective:    Kristin [name] was seen in consultation in the movement disorder clinic at the request of [name], MD.  The evaluation is for tremor.  The patient is a 65 y.o. left handed female with a history of tremor.  The records that were made available to me were reviewed.  According to records, the patient has complained about tremor to her previous primary care physician all the way back to 2012, but in those records she had said that tremor had been going on for years.  She states today, however, that she really thinks that it started in 2012.  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  R hand was always worse even though she is L hand dominant.  It has slowly progressed.  She reports that tremor is in both hands now (R still worse) but she feels tremor on the inside of the body all of the time.  She has tremor at rest now.  She feels nervous now and is unsure if it was related.  She also has the sweats and so she was placed on xanax but that doesn't help the sweating.  She recently went to an exercise class and noted that she wasn't as coordinated/strong with the R hand and that worried her and that is why she wanted a further evaluation.    She did see [name] in June, 2013 and I reviewed that note.  She was diagnosed with essential tremor.  No treatment was recommended.  There is  family hx of tremor in her father.  She states that she has been on metoprolol since 2012 and didn't think that it helped but when she tried to get off of it a year ago, the tremor got worse and she couldn't get off of it.    03/09/14 update:  The patient presents today for follow up.  Today, she is accompanied by her husband who helps to supplement the history.  He was not present on the prior visit.  She was diagnosed with PD last visit.  I started her on Mirapex.  She has been doing better in terms of tremor.  Interestingly, sweats and sleep have been better.  She just  retired last week.  No side effects with the Mirapex.  She is currently taking it at 9 AM/2 PM/9 PM. No compulsive behaviors.  No sleep attacks.  She has been attending therapy.  She is planning on starting the Parkinson's exercise class.  She has been educating herself and reading patient education material.  She had a modified barium evaluation on 02/25/2014.  It was normal, although they suspected that she had mild esophageal dysphagia.  Her husband asks multiple questions today and asked about potentially having another opinion.  The patient has had no falls.  No hallucinations.  07/15/14 update:  Pt is f/u today, accompanied by her husband who supplements the history.  Pt is on mirapex 0.5 mg three times per day.  I stopped her metoprolol last visit. She did fine with that. She is exercising with the Moves class and with circuit II class.  She is also enrolled in the bike class at the [facility].  She is having some soreness because of the exercise and hip pain because of it and she has had a hip replacement and worries about that as she doesn't want to have another.  Thinking about trying chiropractics for that.  She asks me about some.  Stages that she has on the lateral aspect of the knees.  She does not wear boots.  She has not had a knee replacement.  She also complains of some achiness in her toes and it gets better as she walks.  No falls.  No lightheadedness.    11/11/14 update:  The patient returns today for follow-up.  She is on Mirapex, 0.5 mg 3 times per day.  Since our last visit, the patient did go to [facility] for a second opinion.  She saw Dr. [name].  This was just 2 days ago.  No notes are available.  They state that they had a good visit, are glad that they went and got a second opinion and no new recommendations/treatments were given.  She has been doing well at home.  She is very active in our community exercise programs for PD.  She is enrolled in the [facility] exercise program and in our circuit  class and is walking on the treadmill.  She notices tremor in public but generally not as much at home.  She notes wearing off of the medication but only when time for the next dosage.    02/09/15 update:  The patient is following up today regarding her Parkinson's disease.  She is accompanied by her husband who supplements the history.  She did get a second opinion at [facility] with Dr. [name] and his fellow, and [name], on 11/17/2014.  I reviewed those notes.  I told her that she could increase the Mirapex if needed to control tremor further.  She is currently on Mirapex 0.5 mg 3 times per day (7am/1:30 pm/7:00).  She is on Lexapro and Cymbalta for depression.  She is doing exercise.   She went to circuit class for PD today and she will have tremor when doing that.  She notes that when doing exercise, she has more tremor.  No falls.  She went to [place] for 2 weeks with a tour group and she felt that she was able to keep up with the group.  She felt better on this trip than she did 2 years ago on a trip.  05/12/15 update:  The patient falls up today regarding her Parkinson's disease. She is accompanied by her husband who supplements the history.   Last visit, I had her increase her Mirapex to 1 mg in the morning and she remained on 0.5 mg in the afternoon and 0.5 mg in the evening. She was initially tired with the increase in the medication but has adjusted now.  Her daughter and 3 month old grandson are living with them.  She is exercising on the treadmill.  She is doing the Parkinsons exercise class.  She is going to try the boxing class and is in the [employer] class and it is increasing to 2 days a week. She has been doing well with mood on a combination of Lexapro and Cymbalta.  She denies any falls.  No lightheadedness or near syncope.  No hallucinations.  08/12/15 update:  The patient follows up today, accompanied by her husband who supplements the history.  The patient is on pramipexole, 1.0 mg in the morning, 0.5  mg in the afternoon and 0.5 mg in the evening.  She is also planning on attending the [facility] retreat as a patient, which should be very good for her.  She remains on Lexapro and Cymbalta.  They're working well for her.  She is thinking about getting a therapist and asks me about  that.  She also asks me about possible supplements for Parkinson's disease.  She continues to exercise a lot.  She has not had any falls.  No dyskinesia.  No lightheadedness or near syncope.  12/01/15 update:  The patient follows up today, accompanied by her husband who supplements the history.  The patient is on pramipexole, 1.0 mg in the morning, 0.5 mg in the afternoon and 0.5 mg in the evening.  She is also planning on attending the [facility] retreat as a patient, which should be very good for her.  Its in [place].  She remains on Lexapro and Cymbalta.  They're working well for her.  She continues to exercise a lot.  Had not been pole walking and just started and noted that her arms are very sore.  Still knows that she needs a therapist and asked about it last time and gave her Valeda Malm name but she hasn't done it.  She has not had any falls.  No dyskinesia.  No lightheadedness or near syncope.  She just saw Dr. [name] on 11/08/15 and I reviewed those notes.  No changes were made.    Current/Previously tried tremor medications: metoprololol  Current medications that may exacerbate tremor:  Cymbalta, although I reviewed that the patient did try to change to Pristiq but the patient had nightmares with Pristiq and changed back to Cymbalta  Outside reports reviewed: historical medical records, lab reports and referral letter/letters.  Allergies  Allergen Reactions  . Bactrim [Sulfamethoxazole-Trimethoprim] Other (See Comments)    Oral ulcers and rash  . Penicillins Hives    Current Outpatient Prescriptions on File Prior to Visit  Medication Sig Dispense Refill  . ALPRAZolam (XANAX) 0.5 MG tablet Take 1 tablet (0.5 mg total) by  mouth as needed for anxiety or sleep. 30 tablet 1  . bisacodyl (DULCOLAX) 5 MG EC tablet Take 5 mg by mouth daily as needed for moderate constipation.    . DULoxetine (CYMBALTA) 30 MG capsule take 1 capsule by mouth once daily 90 capsule 0  . escitalopram (LEXAPRO) 20 MG tablet Take 1 tablet (20 mg total) by mouth daily. 90 tablet 3  . Krill Oil CAPS 1 krill oil caps daily, MegaRed caps by Schiff    . omeprazole (PRILOSEC) 20 MG capsule Take 1 capsule (20 mg total) by mouth every other day. 45 capsule 1  . pramipexole (MIRAPEX) 0.5 MG tablet Take 2 tablets in the morning, 1 tablet in the afternoon, 1 in the evening 360 tablet 3  . rosuvastatin (CRESTOR) 10 MG tablet TAKE 1 TABLET BY MOUTH ON MONDAYS, WEDNESDAYS, AND FRIDAYS 90 tablet 2  . telmisartan-hydrochlorothiazide (MICARDIS HCT) 80-25 MG per tablet 1/2 tab po daily 90 tablet 3   No current facility-administered medications on file prior to visit.    Past Medical History  Diagnosis Date  . Fibroids   . Abnormal vaginal Pap smear   . Anemia   . GERD (gastroesophageal reflux disease)   . Hyperlipidemia   . Hypertension   . Arthritis   . Anxiety   . Obesity   . Hiatal hernia   . Esophageal stricture 1994  . Chronic UTI     sees dr Terance Hart  . Measles as a child  . Chicken pox as a child  . 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 Depression with anxiety 11/02/2009    Qualifier: Diagnosis of  By: [name], LPN, [name]   . Plantar fasciitis of left foot 06/19/2012  . Dermatitis 07/17/2012  .  Urinary frequency 02/25/2013  . Acute pharyngitis 02/25/2013  . Hyperglycemia 08/19/2013  . Visual floaters 05/11/2014  . Foot pain, bilateral 06/19/2012  . BCC (basal cell carcinoma of skin) 10/05/2014    On back  . Rosacea 10/05/2014    Past Surgical History  Procedure Laterality Date  . Wisdom teeth extracted    . Laporoscopy    . Tonsillectomy    . Abdominal hysterectomy  2006    total  . Partial hip arthroplasty  2010    right  .  Esophageal      stretching    Social History   Social History  . Marital Status: Married    Spouse Name: N/A  . Number of Children: N/A  . Years of Education: N/A   Occupational History  . Not on file.   Social History Main Topics  . Smoking status: Never Smoker   . Smokeless tobacco: Never Used  . Alcohol Use: 2.4 oz/week    4 Glasses of wine per week     Comment: 4 glasses of wine weekly  . Drug Use: No  . Sexual Activity:    Partners: Male   Other Topics Concern  . Not on file   Social History Narrative    Family Status  Relation Status Death Age  . Mother Deceased 29    Dementia, breast cancer, bipolar  . Father Deceased 13    heart failure, rheumatoid arthritis  . Sister Alive     arthritis  . Sister Alive   . Daughter Alive     74  . Son Alive     45  . Maternal Grandmother Deceased   . Maternal Grandfather Deceased   . Paternal Grandmother Deceased   . Paternal Grandfather Deceased   . Sister Alive      healthy  . Sister Alive     healthy  . Daughter Alive     28/ healthy    Review of Systems A complete 10 system ROS was obtained and was negative apart from what is mentioned.   Objective:   VITALS:   Filed Vitals:   12/01/15 0908  BP: 140/80  Pulse: 78  Height: 5\' 5"  (1.651 m)  Weight: 232 lb (105.235 kg)   Wt Readings from Last 3 Encounters:  12/01/15 232 lb (105.235 kg)  08/12/15 231 lb (104.781 kg)  05/12/15 230 lb (104.327 kg)     Gen:  Appears stated age and in NAD.  She is diaphoretic (room is also hot) HEENT:  Normocephalic, atraumatic. The mucous membranes are moist.  Cardiovascular: Regular rate and rhythm Lungs: Clear to auscultation bilaterally Neck: No carotid bruits  NEUROLOGICAL:  Orientation: The patient is alert and oriented x3. Fund of knowledge is appropriate.  Recent and remote memory are intact.  Attention and concentration are normal.    Able to name objects and repeat phrases. Cranial nerves: There is  good facial symmetry. The visual fields are full to confrontational testing. The speech is fluent and clear. Soft palate rises symmetrically and there is no tongue deviation. Hearing is intact to conversational tone. Sensation: Sensation is intact to light touch throughout. Motor: Strength is 5/5 in the bilateral upper and lower extremities.   Shoulder shrug is equal and symmetric.  There is no pronator drift.   Movement examination: Tone: There is good tone bilaterally today.   Tone in the left upper and left lower extremity was normal.  Abnormal movements: There is a intermittent right upper extremity resting  tremor.   Coordination:  The only decremation is with toe taps on the right Gait and Station: The patient has no significant difficulty arising out of a deep-seated chair without the use of the hands. The patient's stride length is fairly normal, but there is marked decreased arm swing on the right.  The patient has a negative pull test.      LABS  Lab Results  Component Value Date   TSH 1.10 01/04/2015   Lab Results  Component Value Date   WBC 6.0 01/04/2015   HGB 12.3 01/04/2015   HCT 36.7 01/04/2015   MCV 85.5 01/04/2015   PLT 216.0 01/04/2015     Chemistry      Component Value Date/Time   NA 143 01/04/2015 1046   K 3.9 01/04/2015 1046   CL 107 01/04/2015 1046   CO2 26 01/04/2015 1046   BUN 16 01/04/2015 1046   CREATININE 0.71 01/04/2015 1046   CREATININE 0.8 05/05/2014 0948      Component Value Date/Time   CALCIUM 9.2 01/04/2015 1046   ALKPHOS 63 01/04/2015 1046   AST 21 01/04/2015 1046   ALT 22 01/04/2015 1046   BILITOT 0.6 01/04/2015 1046          Assessment/Plan:   1. idiopathic Parkinson's disease.  The patient has tremor, bradykinesia, rigidity and mild postural instability.  -continue pramipexole 1 mg in the morning and continue the 0.5 mg in the afternoon and evening.  She has had no compulsive side effects.  This was refilled today.  Risks,  benefits, side effects and alternative therapies were discussed.  The opportunity to ask questions was given and they were answered to the best of my ability.  The patient expressed understanding and willingness to follow the outlined treatment protocols.  -I encouraged her to continue the cardiovascular exercise.  She is really doing a great job in this regard.    -we discussed safety.  Greater than 50% of the 30 minute visit in counseling.  -She has been going to [facility] on a yearly basis. 2.  Depression  -This certainly could be associated with Parkinson's disease, or could be independent of it.  She is on a combination of Lexapro and Cymbalta and she is doing well.    -I talked to her about potentially seeing Amada Jupiter. 3.  Follow-up in the next 3 months, sooner should new neurologic issues arise.

## 2015-12-02 ENCOUNTER — Ambulatory Visit: Payer: Medicare Other | Attending: Family Medicine | Admitting: Physical Therapy

## 2015-12-02 ENCOUNTER — Ambulatory Visit: Payer: Medicare Other

## 2015-12-02 ENCOUNTER — Ambulatory Visit: Payer: Medicare Other | Admitting: Occupational Therapy

## 2015-12-02 ENCOUNTER — Telehealth: Payer: Self-pay | Admitting: Neurology

## 2015-12-02 DIAGNOSIS — R2689 Other abnormalities of gait and mobility: Secondary | ICD-10-CM | POA: Insufficient documentation

## 2015-12-02 DIAGNOSIS — R278 Other lack of coordination: Secondary | ICD-10-CM | POA: Insufficient documentation

## 2015-12-02 DIAGNOSIS — G20 Parkinson's disease: Secondary | ICD-10-CM

## 2015-12-02 DIAGNOSIS — R471 Dysarthria and anarthria: Secondary | ICD-10-CM

## 2015-12-02 DIAGNOSIS — R1319 Other dysphagia: Secondary | ICD-10-CM

## 2015-12-02 NOTE — Therapy (Signed)
[facility] [address] [place], [place], [address] Phone: [phone]   Fax:  [phone]  Patient Details  Name: Kristin [name] MRN: [id number] Date of Birth: 1951/09/04 Referring Provider:  Mosie Lukes, MD  Encounter Date: 12/02/2015  Physical Therapy Parkinson's Disease Screen   Timed Up and Go test:10.30  10 meter walk test:9.86 sec (3.33 ft/sec)  5 time sit to stand test:9.09 sec  Patient would benefit from Physical Therapy evaluation due to slight slowing of mobility measures, reported complaints of R sided stiffness and ankle weakness.      Clayborne Divis W. 12/02/2015, 10:55 AM  Frazier Butt., PT  [facility] [address] [place], [place], [address] Phone: [phone]   Fax:  [phone]

## 2015-12-02 NOTE — Telephone Encounter (Signed)
-----   Message from [place], DO sent at 12/02/2015 12:08 PM EDT -----   ----- Message -----    From: Sharen Counter, CCC-SLP    Sent: 12/02/2015  11:25 AM      To: Eustace Quail Tat, DO  Hi Dr Tat- I saw her today.Marland KitchenMarland KitchenI am recommending a modified barium swallow for Mrs. Clevinger. She reported having to be extra careful with liquids and needing to chew more thoroughly. I could do a bedside but would just end up recommending a modified anyway...with these symptoms and her dx. She had one done in 2010 so this will be a good comparison.  Thanks- Glendell Docker

## 2015-12-02 NOTE — Therapy (Signed)
[facility] [address] [place], [place], [address] Phone: [phone]   Fax:  [phone]  Patient Details  Name: Kristin [name] MRN: [id number] Date of Birth: 05/06/1951 Referring Provider: Alonza Bogus, DO  Encounter Date: 12/02/2015  Speech Therapy Parkinson's Disease Screen   Pt *has* experienced difficulty in swallowing warranting objective evaluation. She has had to chew food more thoroughly, and has been more deliberate with swallowing liquids. She would likely from a modified barium swallow eval (MBSS). If agreed, please order via EPIC or call [phone] to schedule, and get instructions about how to order via EPIC.  Decibel Level today: 71dB  (WNL=70-72 dB) with sound level meter 30cm away from pt's mouth. Pt's conversational volume has remained essentially the same since last treatment course.   [facility] ,[place], [place] 12/02/2015, 10:39 AM  [facility] [address] [place], [place], [address] Phone: [phone]   Fax:  [phone]

## 2015-12-02 NOTE — Therapy (Signed)
[facility] [address] [place], [place], [address] Phone: [phone]   Fax:  [phone]  Patient Details  Name: CLORISA ANTUNEZ MRN: [id number] Date of Birth: 1951-03-17 Referring Provider:  Mosie Lukes, MD  Encounter Date: 12/02/2015  Occupational Therapy Parkinson's Disease Screen   9-hole peg test:    RUE  27.09sec        LUE  19.75sec  Box & Blocks Test:   RUE  46blocks        LUE  61blocks  Change in ability to perform ADLs/IADLs:  Difficulty with washing hair with RUE.  Pt reports fatigue with RUE during functional tasks.  Pt would benefit from occupational therapy evaluation due to  Change in coordination.     [facility] 12/02/2015, 10:23 AM  [facility] [address] [place], [place], [address] Phone: [phone]   Fax:  [place], OTR/L [facility] [address] [place], [place]  [address] [phone] phone [phone] 12/02/2015 1:08 PM

## 2015-12-02 NOTE — Telephone Encounter (Signed)
Left message with husband for patient to call back.

## 2015-12-03 NOTE — Telephone Encounter (Signed)
Left message on machine for patient to call back.

## 2015-12-06 NOTE — Telephone Encounter (Signed)
-----   Message from [place], DO sent at 12/06/2015  2:26 PM EDT -----   ----- Message -----    From: Sharen Counter, CCC-SLP    Sent: 12/06/2015   2:14 PM      To: Eustace Quail Tat, DO  Hi again Dr. Carles Collet- Amy and Levada Dy also requested scripts for PT and OT, respectively. If agreed, you can send those via EPIC. And as always, if you have concerns or questions please let us know.  Thanks. Glendell Docker

## 2015-12-07 ENCOUNTER — Other Ambulatory Visit (HOSPITAL_COMMUNITY): Payer: Self-pay | Admitting: Neurology

## 2015-12-07 ENCOUNTER — Ambulatory Visit (INDEPENDENT_AMBULATORY_CARE_PROVIDER_SITE_OTHER): Payer: Medicare Other | Admitting: Family Medicine

## 2015-12-07 ENCOUNTER — Encounter: Payer: Self-pay | Admitting: Family Medicine

## 2015-12-07 VITALS — BP 120/78 | HR 85 | Temp 98.0°F | Ht 65.0 in | Wt 230.2 lb

## 2015-12-07 DIAGNOSIS — R350 Frequency of micturition: Secondary | ICD-10-CM | POA: Diagnosis not present

## 2015-12-07 DIAGNOSIS — I10 Essential (primary) hypertension: Secondary | ICD-10-CM

## 2015-12-07 DIAGNOSIS — G20A1 Parkinson's disease without dyskinesia, without mention of fluctuations: Secondary | ICD-10-CM

## 2015-12-07 DIAGNOSIS — M5416 Radiculopathy, lumbar region: Secondary | ICD-10-CM

## 2015-12-07 DIAGNOSIS — E785 Hyperlipidemia, unspecified: Secondary | ICD-10-CM | POA: Diagnosis not present

## 2015-12-07 DIAGNOSIS — F418 Other specified anxiety disorders: Secondary | ICD-10-CM | POA: Diagnosis not present

## 2015-12-07 DIAGNOSIS — Z0000 Encounter for general adult medical examination without abnormal findings: Secondary | ICD-10-CM

## 2015-12-07 DIAGNOSIS — D509 Iron deficiency anemia, unspecified: Secondary | ICD-10-CM

## 2015-12-07 DIAGNOSIS — N39498 Other specified urinary incontinence: Secondary | ICD-10-CM

## 2015-12-07 DIAGNOSIS — M5417 Radiculopathy, lumbosacral region: Secondary | ICD-10-CM | POA: Diagnosis not present

## 2015-12-07 DIAGNOSIS — Z23 Encounter for immunization: Secondary | ICD-10-CM | POA: Diagnosis not present

## 2015-12-07 DIAGNOSIS — K219 Gastro-esophageal reflux disease without esophagitis: Secondary | ICD-10-CM

## 2015-12-07 DIAGNOSIS — Z780 Asymptomatic menopausal state: Secondary | ICD-10-CM

## 2015-12-07 DIAGNOSIS — K5909 Other constipation: Secondary | ICD-10-CM

## 2015-12-07 DIAGNOSIS — R1314 Dysphagia, pharyngoesophageal phase: Secondary | ICD-10-CM | POA: Diagnosis not present

## 2015-12-07 DIAGNOSIS — R739 Hyperglycemia, unspecified: Secondary | ICD-10-CM | POA: Diagnosis not present

## 2015-12-07 DIAGNOSIS — F341 Dysthymic disorder: Secondary | ICD-10-CM | POA: Diagnosis not present

## 2015-12-07 DIAGNOSIS — R7309 Other abnormal glucose: Secondary | ICD-10-CM | POA: Diagnosis not present

## 2015-12-07 DIAGNOSIS — E669 Obesity, unspecified: Secondary | ICD-10-CM

## 2015-12-07 DIAGNOSIS — G20 Parkinson's disease: Secondary | ICD-10-CM | POA: Diagnosis not present

## 2015-12-07 DIAGNOSIS — R1310 Dysphagia, unspecified: Secondary | ICD-10-CM

## 2015-12-07 DIAGNOSIS — K5900 Constipation, unspecified: Secondary | ICD-10-CM

## 2015-12-07 HISTORY — DX: Constipation, unspecified: K59.00

## 2015-12-07 MED ORDER — OMEPRAZOLE 20 MG PO CPDR: 20.0000 mg | DELAYED_RELEASE_CAPSULE | Freq: Every day | ORAL | Status: DC

## 2015-12-07 MED ORDER — ZOSTER VACCINE LIVE 19400 UNT/0.65ML ~~LOC~~ SOLR: 0.6500 mL | Freq: Once | SUBCUTANEOUS | Status: DC

## 2015-12-07 MED ORDER — TELMISARTAN-HCTZ 80-12.5 MG PO TABS: 1.0000 | ORAL_TABLET | Freq: Every day | ORAL | Status: DC

## 2015-12-07 MED ORDER — ALPRAZOLAM 0.5 MG PO TABS: 0.5000 mg | ORAL_TABLET | ORAL | Status: DC | PRN

## 2015-12-07 NOTE — Assessment & Plan Note (Signed)
Avoid offending foods, start probiotics. Do not eat large meals in late evening and consider raising head of bed.  

## 2015-12-07 NOTE — Addendum Note (Signed)
Addended byAnnamaria Helling on: 12/07/2015 03:42 PM   Modules accepted: Orders

## 2015-12-07 NOTE — Assessment & Plan Note (Signed)
Following with [facility], right side is weaker and slower, increased tremor. Also some twisting and weakness in right ankle

## 2015-12-07 NOTE — Progress Notes (Signed)
Pre visit review using our clinic review tool, if applicable. No additional management support is needed unless otherwise documented below in the visit note. 

## 2015-12-07 NOTE — Telephone Encounter (Signed)
Mychart message sent to patient.

## 2015-12-07 NOTE — Assessment & Plan Note (Signed)
Tolerating statin, encouraged heart healthy diet, avoid trans fats, minimize simple carbs and saturated fats. Increase exercise as tolerated 

## 2015-12-07 NOTE — Assessment & Plan Note (Signed)
Incontinence, will check UA with c&s, start Kegel's bid

## 2015-12-07 NOTE — Assessment & Plan Note (Signed)
Encouraged increased hydration and fiber in diet. Daily probiotics. If bowels not moving can use MOM 2 tbls po in 4 oz of warm prune juice by mouth every 2-3 days. If no results then repeat in 4 hours with  Dulcolax suppository pr, may repeat again in 4 more hours as needed. Seek care if symptoms worsen. Consider daily Miralax and/or Dulcolax if symptoms persist. NOW probiotic 10 strains 1 cap daily

## 2015-12-07 NOTE — Assessment & Plan Note (Signed)
Speech therapy with neurorehab is working this up with some swallow test

## 2015-12-07 NOTE — Assessment & Plan Note (Signed)
Well controlled, no changes to meds. Encouraged heart healthy diet such as the DASH diet and exercise as tolerated.  °

## 2015-12-07 NOTE — Patient Instructions (Addendum)
Kegel exercises sets of 10 twice a week  Add NOW probiotic daily, get at [facility] or [email or web address] Miralax once to twice daily and Benefiber once to twice daily  Encouraged increased hydration and fiber in diet. Daily probiotics. If bowels not moving can use MOM 2 tbls po in 4 oz of warm prune juice by mouth every 2-3 days. If no results then repeat in 4 hours with  Dulcolax suppository pr, may repeat again in 4 more hours as needed. Seek care if symptoms worsen. Consider daily Miralax and/or Dulcolax if symptoms persist.   Bring in a copy of Health care power of attorney and living will  Preventive Care for Adults, Female A healthy lifestyle and preventive care can promote health and wellness. Preventive health guidelines for women include the following key practices.  A routine yearly physical is a good way to check with your health care provider about your health and preventive screening. It is a chance to share any concerns and updates on your health and to receive a thorough exam.  Visit your dentist for a routine exam and preventive care every 6 months. Brush your teeth twice a day and floss once a day. Good oral hygiene prevents tooth decay and gum disease.  The frequency of eye exams is based on your age, health, family medical history, use of contact lenses, and other factors. Follow your health care provider's recommendations for frequency of eye exams.  Eat a healthy diet. Foods like vegetables, fruits, whole grains, low-fat dairy products, and lean protein foods contain the nutrients you need without too many calories. Decrease your intake of foods high in solid fats, added sugars, and salt. Eat the right amount of calories for you.Get information about a proper diet from your health care provider, if necessary.  Regular physical exercise is one of the most important things you can do for your health. Most adults should get at least 150 minutes of moderate-intensity exercise (any  activity that increases your heart rate and causes you to sweat) each week. In addition, most adults need muscle-strengthening exercises on 2 or more days a week.  Maintain a healthy weight. The body mass index (BMI) is a screening tool to identify possible weight problems. It provides an estimate of body fat based on height and weight. Your health care provider can find your BMI and can help you achieve or maintain a healthy weight.For adults 20 years and older:  A BMI below 18.5 is considered underweight.  A BMI of 18.5 to 24.9 is normal.  A BMI of 25 to 29.9 is considered overweight.  A BMI of 30 and above is considered obese.  Maintain normal blood lipids and cholesterol levels by exercising and minimizing your intake of saturated fat. Eat a balanced diet with plenty of fruit and vegetables. Blood tests for lipids and cholesterol should begin at age 67 and be repeated every 5 years. If your lipid or cholesterol levels are high, you are over 50, or you are at high risk for heart disease, you may need your cholesterol levels checked more frequently.Ongoing high lipid and cholesterol levels should be treated with medicines if diet and exercise are not working.  If you smoke, find out from your health care provider how to quit. If you do not use tobacco, do not start.  Lung cancer screening is recommended for adults aged 28-80 years who are at high risk for developing lung cancer because of a history of smoking. A yearly low-dose CT scan of  the lungs is recommended for people who have at least a 30-pack-year history of smoking and are a current smoker or have quit within the past 15 years. A pack year of smoking is smoking an average of 1 pack of cigarettes a day for 1 year (for example: 1 pack a day for 30 years or 2 packs a day for 15 years). Yearly screening should continue until the smoker has stopped smoking for at least 15 years. Yearly screening should be stopped for people who develop a  health problem that would prevent them from having lung cancer treatment.  If you are pregnant, do not drink alcohol. If you are breastfeeding, be very cautious about drinking alcohol. If you are not pregnant and choose to drink alcohol, do not have more than 1 drink per day. One drink is considered to be 12 ounces (355 mL) of beer, 5 ounces (148 mL) of wine, or 1.5 ounces (44 mL) of liquor.  Avoid use of street drugs. Do not share needles with anyone. Ask for help if you need support or instructions about stopping the use of drugs.  High blood pressure causes heart disease and increases the risk of stroke. Your blood pressure should be checked at least every 1 to 2 years. Ongoing high blood pressure should be treated with medicines if weight loss and exercise do not work.  If you are 21-70 years old, ask your health care provider if you should take aspirin to prevent strokes.  Diabetes screening is done by taking a blood sample to check your blood glucose level after you have not eaten for a certain period of time (fasting). If you are not overweight and you do not have risk factors for diabetes, you should be screened once every 3 years starting at age 39. If you are overweight or obese and you are 54-44 years of age, you should be screened for diabetes every year as part of your cardiovascular risk assessment.  Breast cancer screening is essential preventive care for women. You should practice "breast self-awareness." This means understanding the normal appearance and feel of your breasts and may include breast self-examination. Any changes detected, no matter how small, should be reported to a health care provider. Women in their 42s and 30s should have a clinical breast exam (CBE) by a health care provider as part of a regular health exam every 1 to 3 years. After age 27, women should have a CBE every year. Starting at age 42, women should consider having a mammogram (breast X-ray test) every year.  Women who have a family history of breast cancer should talk to their health care provider about genetic screening. Women at a high risk of breast cancer should talk to their health care providers about having an MRI and a mammogram every year.  Breast cancer gene (BRCA)-related cancer risk assessment is recommended for women who have family members with BRCA-related cancers. BRCA-related cancers include breast, ovarian, tubal, and peritoneal cancers. Having family members with these cancers may be associated with an increased risk for harmful changes (mutations) in the breast cancer genes BRCA1 and BRCA2. Results of the assessment will determine the need for genetic counseling and BRCA1 and BRCA2 testing.  Your health care provider may recommend that you be screened regularly for cancer of the pelvic organs (ovaries, uterus, and vagina). This screening involves a pelvic examination, including checking for microscopic changes to the surface of your cervix (Pap test). You may be encouraged to have this screening done  every 3 years, beginning at age 81.  For women ages 60-65, health care providers may recommend pelvic exams and Pap testing every 3 years, or they may recommend the Pap and pelvic exam, combined with testing for human papilloma virus (HPV), every 5 years. Some types of HPV increase your risk of cervical cancer. Testing for HPV may also be done on women of any age with unclear Pap test results.  Other health care providers may not recommend any screening for nonpregnant women who are considered low risk for pelvic cancer and who do not have symptoms. Ask your health care provider if a screening pelvic exam is right for you.  If you have had past treatment for cervical cancer or a condition that could lead to cancer, you need Pap tests and screening for cancer for at least 20 years after your treatment. If Pap tests have been discontinued, your risk factors (such as having a new sexual partner)  need to be reassessed to determine if screening should resume. Some women have medical problems that increase the chance of getting cervical cancer. In these cases, your health care provider may recommend more frequent screening and Pap tests.  Colorectal cancer can be detected and often prevented. Most routine colorectal cancer screening begins at the age of 27 years and continues through age 55 years. However, your health care provider may recommend screening at an earlier age if you have risk factors for colon cancer. On a yearly basis, your health care provider may provide home test kits to check for hidden blood in the stool. Use of a small camera at the end of a tube, to directly examine the colon (sigmoidoscopy or colonoscopy), can detect the earliest forms of colorectal cancer. Talk to your health care provider about this at age 24, when routine screening begins. Direct exam of the colon should be repeated every 5-10 years through age 76 years, unless early forms of precancerous polyps or small growths are found.  People who are at an increased risk for hepatitis B should be screened for this virus. You are considered at high risk for hepatitis B if:  You were born in a country where hepatitis B occurs often. Talk with your health care provider about which countries are considered high risk.  Your parents were born in a high-risk country and you have not received a shot to protect against hepatitis B (hepatitis B vaccine).  You have HIV or AIDS.  You use needles to inject street drugs.  You live with, or have sex with, someone who has hepatitis B.  You get hemodialysis treatment.  You take certain medicines for conditions like cancer, organ transplantation, and autoimmune conditions.  Hepatitis C blood testing is recommended for all people born from [birth year] through 1966 and any individual with known risks for hepatitis C.  Practice safe sex. Use condoms and avoid high-risk sexual  practices to reduce the spread of sexually transmitted infections (STIs). STIs include gonorrhea, chlamydia, syphilis, trichomonas, herpes, HPV, and human immunodeficiency virus (HIV). Herpes, HIV, and HPV are viral illnesses that have no cure. They can result in disability, cancer, and death.  You should be screened for sexually transmitted illnesses (STIs) including gonorrhea and chlamydia if:  You are sexually active and are younger than 24 years.  You are older than 24 years and your health care provider tells you that you are at risk for this type of infection.  Your sexual activity has changed since you were last screened and you  are at an increased risk for chlamydia or gonorrhea. Ask your health care provider if you are at risk.  If you are at risk of being infected with HIV, it is recommended that you take a prescription medicine daily to prevent HIV infection. This is called preexposure prophylaxis (PrEP). You are considered at risk if:  You are sexually active and do not regularly use condoms or know the HIV status of your partner(s).  You take drugs by injection.  You are sexually active with a partner who has HIV.  Talk with your health care provider about whether you are at high risk of being infected with HIV. If you choose to begin PrEP, you should first be tested for HIV. You should then be tested every 3 months for as long as you are taking PrEP.  Osteoporosis is a disease in which the bones lose minerals and strength with aging. This can result in serious bone fractures or breaks. The risk of osteoporosis can be identified using a bone density scan. Women ages 31 years and over and women at risk for fractures or osteoporosis should discuss screening with their health care providers. Ask your health care provider whether you should take a calcium supplement or vitamin D to reduce the rate of osteoporosis.  Menopause can be associated with physical symptoms and risks. Hormone  replacement therapy is available to decrease symptoms and risks. You should talk to your health care provider about whether hormone replacement therapy is right for you.  Use sunscreen. Apply sunscreen liberally and repeatedly throughout the day. You should seek shade when your shadow is shorter than you. Protect yourself by wearing long sleeves, pants, a wide-brimmed hat, and sunglasses year round, whenever you are outdoors.  Once a month, do a whole body skin exam, using a mirror to look at the skin on your back. Tell your health care provider of new moles, moles that have irregular borders, moles that are larger than a pencil eraser, or moles that have changed in shape or color.  Stay current with required vaccines (immunizations).  Influenza vaccine. All adults should be immunized every year.  Tetanus, diphtheria, and acellular pertussis (Td, Tdap) vaccine. Pregnant women should receive 1 dose of Tdap vaccine during each pregnancy. The dose should be obtained regardless of the length of time since the last dose. Immunization is preferred during the 27th-36th week of gestation. An adult who has not previously received Tdap or who does not know her vaccine status should receive 1 dose of Tdap. This initial dose should be followed by tetanus and diphtheria toxoids (Td) booster doses every 10 years. Adults with an unknown or incomplete history of completing a 3-dose immunization series with Td-containing vaccines should begin or complete a primary immunization series including a Tdap dose. Adults should receive a Td booster every 10 years.  Varicella vaccine. An adult without evidence of immunity to varicella should receive 2 doses or a second dose if she has previously received 1 dose. Pregnant females who do not have evidence of immunity should receive the first dose after pregnancy. This first dose should be obtained before leaving the health care facility. The second dose should be obtained 4-8 weeks  after the first dose.  Human papillomavirus (HPV) vaccine. Females aged 13-26 years who have not received the vaccine previously should obtain the 3-dose series. The vaccine is not recommended for use in pregnant females. However, pregnancy testing is not needed before receiving a dose. If a female is found to  be pregnant after receiving a dose, no treatment is needed. In that case, the remaining doses should be delayed until after the pregnancy. Immunization is recommended for any person with an immunocompromised condition through the age of 42 years if she did not get any or all doses earlier. During the 3-dose series, the second dose should be obtained 4-8 weeks after the first dose. The third dose should be obtained 24 weeks after the first dose and 16 weeks after the second dose.  Zoster vaccine. One dose is recommended for adults aged 53 years or older unless certain conditions are present.  Measles, mumps, and rubella (MMR) vaccine. Adults born before [birth year] generally are considered immune to measles and mumps. Adults born in [birth year] or later should have 1 or more doses of MMR vaccine unless there is a contraindication to the vaccine or there is laboratory evidence of immunity to each of the three diseases. A routine second dose of MMR vaccine should be obtained at least 28 days after the first dose for students attending postsecondary schools, health care workers, or international travelers. People who received inactivated measles vaccine or an unknown type of measles vaccine during 1963-1967 should receive 2 doses of MMR vaccine. People who received inactivated mumps vaccine or an unknown type of mumps vaccine before 1979 and are at high risk for mumps infection should consider immunization with 2 doses of MMR vaccine. For females of childbearing age, rubella immunity should be determined. If there is no evidence of immunity, females who are not pregnant should be vaccinated. If there is no evidence of  immunity, females who are pregnant should delay immunization until after pregnancy. Unvaccinated health care workers born before [birth year] who lack laboratory evidence of measles, mumps, or rubella immunity or laboratory confirmation of disease should consider measles and mumps immunization with 2 doses of MMR vaccine or rubella immunization with 1 dose of MMR vaccine.  Pneumococcal 13-valent conjugate (PCV13) vaccine. When indicated, a person who is uncertain of his immunization history and has no record of immunization should receive the PCV13 vaccine. All adults 69 years of age and older should receive this vaccine. An adult aged 6 years or older who has certain medical conditions and has not been previously immunized should receive 1 dose of PCV13 vaccine. This PCV13 should be followed with a dose of pneumococcal polysaccharide (PPSV23) vaccine. Adults who are at high risk for pneumococcal disease should obtain the PPSV23 vaccine at least 8 weeks after the dose of PCV13 vaccine. Adults older than 66 years of age who have normal immune system function should obtain the PPSV23 vaccine dose at least 1 year after the dose of PCV13 vaccine.  Pneumococcal polysaccharide (PPSV23) vaccine. When PCV13 is also indicated, PCV13 should be obtained first. All adults aged 5 years and older should be immunized. An adult younger than age 27 years who has certain medical conditions should be immunized. Any person who resides in a nursing home or long-term care facility should be immunized. An adult smoker should be immunized. People with an immunocompromised condition and certain other conditions should receive both PCV13 and PPSV23 vaccines. People with human immunodeficiency virus (HIV) infection should be immunized as soon as possible after diagnosis. Immunization during chemotherapy or radiation therapy should be avoided. Routine use of PPSV23 vaccine is not recommended for American Indians, [place] Natives, or people  younger than 65 years unless there are medical conditions that require PPSV23 vaccine. When indicated, people who have unknown immunization and have no record  of immunization should receive PPSV23 vaccine. One-time revaccination 5 years after the first dose of PPSV23 is recommended for people aged 19-64 years who have chronic kidney failure, nephrotic syndrome, asplenia, or immunocompromised conditions. People who received 1-2 doses of PPSV23 before age 44 years should receive another dose of PPSV23 vaccine at age 5 years or later if at least 5 years have passed since the previous dose. Doses of PPSV23 are not needed for people immunized with PPSV23 at or after age 68 years.  Meningococcal vaccine. Adults with asplenia or persistent complement component deficiencies should receive 2 doses of quadrivalent meningococcal conjugate (MenACWY-D) vaccine. The doses should be obtained at least 2 months apart. Microbiologists working with certain meningococcal bacteria, [place] recruits, people at risk during an outbreak, and people who travel to or live in countries with a high rate of meningitis should be immunized. A first-year college student up through age 35 years who is living in a residence hall should receive a dose if she did not receive a dose on or after her 16th birthday. Adults who have certain high-risk conditions should receive one or more doses of vaccine.  Hepatitis A vaccine. Adults who wish to be protected from this disease, have certain high-risk conditions, work with hepatitis A-infected animals, work in hepatitis A research labs, or travel to or work in countries with a high rate of hepatitis A should be immunized. Adults who were previously unvaccinated and who anticipate close contact with an international adoptee during the first 60 days after arrival in the [place] from a country with a high rate of hepatitis A should be immunized.  Hepatitis B vaccine. Adults who wish to be protected  from this disease, have certain high-risk conditions, may be exposed to blood or other infectious body fluids, are household contacts or sex partners of hepatitis B positive people, are clients or workers in certain care facilities, or travel to or work in countries with a high rate of hepatitis B should be immunized.  Haemophilus influenzae type b (Hib) vaccine. A previously unvaccinated person with asplenia or sickle cell disease or having a scheduled splenectomy should receive 1 dose of Hib vaccine. Regardless of previous immunization, a recipient of a hematopoietic stem cell transplant should receive a 3-dose series 6-12 months after her successful transplant. Hib vaccine is not recommended for adults with HIV infection. Preventive Services / Frequency Ages 44 to 12 years  Blood pressure check.** / Every 3-5 years.  Lipid and cholesterol check.** / Every 5 years beginning at age 12.  Clinical breast exam.** / Every 3 years for women in their 51s and 72s.  BRCA-related cancer risk assessment.** / For women who have family members with a BRCA-related cancer (breast, ovarian, tubal, or peritoneal cancers).  Pap test.** / Every 2 years from ages 61 through 67. Every 3 years starting at age 45 through age 8 or 61 with a history of 3 consecutive normal Pap tests.  HPV screening.** / Every 3 years from ages 40 through ages 66 to 58 with a history of 3 consecutive normal Pap tests.  Hepatitis C blood test.** / For any individual with known risks for hepatitis C.  Skin self-exam. / Monthly.  Influenza vaccine. / Every year.  Tetanus, diphtheria, and acellular pertussis (Tdap, Td) vaccine.** / Consult your health care provider. Pregnant women should receive 1 dose of Tdap vaccine during each pregnancy. 1 dose of Td every 10 years.  Varicella vaccine.** / Consult your health care provider. Pregnant females  who do not have evidence of immunity should receive the first dose after pregnancy.  HPV  vaccine. / 3 doses over 6 months, if 50 and younger. The vaccine is not recommended for use in pregnant females. However, pregnancy testing is not needed before receiving a dose.  Measles, mumps, rubella (MMR) vaccine.** / You need at least 1 dose of MMR if you were born in 1957 or later. You may also need a 2nd dose. For females of childbearing age, rubella immunity should be determined. If there is no evidence of immunity, females who are not pregnant should be vaccinated. If there is no evidence of immunity, females who are pregnant should delay immunization until after pregnancy.  Pneumococcal 13-valent conjugate (PCV13) vaccine.** / Consult your health care provider.  Pneumococcal polysaccharide (PPSV23) vaccine.** / 1 to 2 doses if you smoke cigarettes or if you have certain conditions.  Meningococcal vaccine.** / 1 dose if you are age 10 to 32 years and a Market researcher living in a residence hall, or have one of several medical conditions, you need to get vaccinated against meningococcal disease. You may also need additional booster doses.  Hepatitis A vaccine.** / Consult your health care provider.  Hepatitis B vaccine.** / Consult your health care provider.  Haemophilus influenzae type b (Hib) vaccine.** / Consult your health care provider. Ages 44 to 32 years  Blood pressure check.** / Every year.  Lipid and cholesterol check.** / Every 5 years beginning at age 70 years.  Lung cancer screening. / Every year if you are aged 41-80 years and have a 30-pack-year history of smoking and currently smoke or have quit within the past 15 years. Yearly screening is stopped once you have quit smoking for at least 15 years or develop a health problem that would prevent you from having lung cancer treatment.  Clinical breast exam.** / Every year after age 109 years.  BRCA-related cancer risk assessment.** / For women who have family members with a BRCA-related cancer (breast,  ovarian, tubal, or peritoneal cancers).  Mammogram.** / Every year beginning at age 41 years and continuing for as long as you are in good health. Consult with your health care provider.  Pap test.** / Every 3 years starting at age 67 years through age 69 or 74 years with a history of 3 consecutive normal Pap tests.  HPV screening.** / Every 3 years from ages 37 years through ages 3 to 6 years with a history of 3 consecutive normal Pap tests.  Fecal occult blood test (FOBT) of stool. / Every year beginning at age 67 years and continuing until age 38 years. You may not need to do this test if you get a colonoscopy every 10 years.  Flexible sigmoidoscopy or colonoscopy.** / Every 5 years for a flexible sigmoidoscopy or every 10 years for a colonoscopy beginning at age 50 years and continuing until age 76 years.  Hepatitis C blood test.** / For all people born from [birth year] through 1966 and any individual with known risks for hepatitis C.  Skin self-exam. / Monthly.  Influenza vaccine. / Every year.  Tetanus, diphtheria, and acellular pertussis (Tdap/Td) vaccine.** / Consult your health care provider. Pregnant women should receive 1 dose of Tdap vaccine during each pregnancy. 1 dose of Td every 10 years.  Varicella vaccine.** / Consult your health care provider. Pregnant females who do not have evidence of immunity should receive the first dose after pregnancy.  Zoster vaccine.** / 1 dose for adults aged  72 years or older.  Measles, mumps, rubella (MMR) vaccine.** / You need at least 1 dose of MMR if you were born in 1957 or later. You may also need a second dose. For females of childbearing age, rubella immunity should be determined. If there is no evidence of immunity, females who are not pregnant should be vaccinated. If there is no evidence of immunity, females who are pregnant should delay immunization until after pregnancy.  Pneumococcal 13-valent conjugate (PCV13) vaccine.** / Consult  your health care provider.  Pneumococcal polysaccharide (PPSV23) vaccine.** / 1 to 2 doses if you smoke cigarettes or if you have certain conditions.  Meningococcal vaccine.** / Consult your health care provider.  Hepatitis A vaccine.** / Consult your health care provider.  Hepatitis B vaccine.** / Consult your health care provider.  Haemophilus influenzae type b (Hib) vaccine.** / Consult your health care provider. Ages 67 years and over  Blood pressure check.** / Every year.  Lipid and cholesterol check.** / Every 5 years beginning at age 57 years.  Lung cancer screening. / Every year if you are aged 82-80 years and have a 30-pack-year history of smoking and currently smoke or have quit within the past 15 years. Yearly screening is stopped once you have quit smoking for at least 15 years or develop a health problem that would prevent you from having lung cancer treatment.  Clinical breast exam.** / Every year after age 86 years.  BRCA-related cancer risk assessment.** / For women who have family members with a BRCA-related cancer (breast, ovarian, tubal, or peritoneal cancers).  Mammogram.** / Every year beginning at age 80 years and continuing for as long as you are in good health. Consult with your health care provider.  Pap test.** / Every 3 years starting at age 26 years through age 17 or 44 years with 3 consecutive normal Pap tests. Testing can be stopped between 65 and 70 years with 3 consecutive normal Pap tests and no abnormal Pap or HPV tests in the past 10 years.  HPV screening.** / Every 3 years from ages 26 years through ages 38 or 74 years with a history of 3 consecutive normal Pap tests. Testing can be stopped between 65 and 70 years with 3 consecutive normal Pap tests and no abnormal Pap or HPV tests in the past 10 years.  Fecal occult blood test (FOBT) of stool. / Every year beginning at age 40 years and continuing until age 16 years. You may not need to do this test  if you get a colonoscopy every 10 years.  Flexible sigmoidoscopy or colonoscopy.** / Every 5 years for a flexible sigmoidoscopy or every 10 years for a colonoscopy beginning at age 18 years and continuing until age 13 years.  Hepatitis C blood test.** / For all people born from [birth year] through 1966 and any individual with known risks for hepatitis C.  Osteoporosis screening.** / A one-time screening for women ages 59 years and over and women at risk for fractures or osteoporosis.  Skin self-exam. / Monthly.  Influenza vaccine. / Every year.  Tetanus, diphtheria, and acellular pertussis (Tdap/Td) vaccine.** / 1 dose of Td every 10 years.  Varicella vaccine.** / Consult your health care provider.  Zoster vaccine.** / 1 dose for adults aged 47 years or older.  Pneumococcal 13-valent conjugate (PCV13) vaccine.** / Consult your health care provider.  Pneumococcal polysaccharide (PPSV23) vaccine.** / 1 dose for all adults aged 29 years and older.  Meningococcal vaccine.** / Consult your health care  provider.  Hepatitis A vaccine.** / Consult your health care provider.  Hepatitis B vaccine.** / Consult your health care provider.  Haemophilus influenzae type b (Hib) vaccine.** / Consult your health care provider. ** Family history and personal history of risk and conditions may change your health care provider's recommendations.   This information is not intended to replace advice given to you by your health care provider. Make sure you discuss any questions you have with your health care provider.   Document Released: 10/17/2001 Document Revised: 09/11/2014 Document Reviewed: 01/16/2011 Elsevier Interactive Patient Education [employer].

## 2015-12-07 NOTE — Progress Notes (Signed)
Patient ID: Kristin [name], female   DOB: [birth year], 65 y.o.   MRN: [id number]   Subjective:    Patient ID: Kristin [name], female    DOB: September 10, 1950, 65 y.o.   MRN: [id number]  Chief Complaint  Patient presents with  . Annual Exam    HPI Patient is in today for annual exam. No recent illness or new acute concerns. She is noting some episodes of feeling light headed while exercising. Does not occur at other times. No recent illness. She continues to follow with neurology. Denies CP/palp/SOB/HA/congestion/fevers/GI or GU c/o. Taking meds as prescribed  Past Medical History  Diagnosis Date  . Fibroids   . Abnormal vaginal Pap smear   . Anemia   . GERD (gastroesophageal reflux disease)   . Hyperlipidemia   . Hypertension   . Arthritis   . Anxiety   . Obesity   . Hiatal hernia   . Esophageal stricture 1994  . Chronic UTI     sees dr Terance Hart  . Measles as a child  . Chicken pox as a child  . 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 Depression with anxiety 11/02/2009    Qualifier: Diagnosis of  By: [name], LPN, [name]   . Plantar fasciitis of left foot 06/19/2012  . Dermatitis 07/17/2012  . Urinary frequency 02/25/2013  . Acute pharyngitis 02/25/2013  . Hyperglycemia 08/19/2013  . Visual floaters 05/11/2014  . Foot pain, bilateral 06/19/2012  . BCC (basal cell carcinoma of skin) 10/05/2014    On back  . Rosacea 10/05/2014  . Constipation 12/07/2015  . Preventative health care 12/19/2015    Past Surgical History  Procedure Laterality Date  . Wisdom teeth extracted    . Laporoscopy    . Tonsillectomy    . Abdominal hysterectomy  2006    total  . Partial hip arthroplasty  2010    right  . Esophageal      stretching    Family History  Problem Relation Age of Onset  . Cancer Mother     breast  . Other Mother     arrythmia  . Mental illness Mother     bipolar  . Heart disease Father   . Arthritis Father     rheumatoid  . Depression Sister   . Mental illness Sister    bipolar  . Parkinson's disease Sister   . Arthritis Sister   . Colon cancer Neg Hx   . Esophageal cancer Neg Hx   . Rectal cancer Neg Hx   . Stomach cancer Neg Hx     Social History   Social History  . Marital Status: Married    Spouse Name: N/A  . Number of Children: N/A  . Years of Education: N/A   Occupational History  . Not on file.   Social History Main Topics  . Smoking status: Never Smoker   . Smokeless tobacco: Never Used  . Alcohol Use: 2.4 oz/week    4 Glasses of wine per week     Comment: 4 glasses of wine weekly  . Drug Use: No  . Sexual Activity:    Partners: Male   Other Topics Concern  . Not on file   Social History Narrative   Lives with husband, no major dietary restrictions, retired from teaching    Outpatient Prescriptions Prior to Visit  Medication Sig Dispense Refill  . bisacodyl (DULCOLAX) 5 MG EC tablet Take 5 mg by mouth daily as needed for moderate constipation.    Marland Kitchen  DULoxetine (CYMBALTA) 30 MG capsule take 1 capsule by mouth once daily 90 capsule 0  . escitalopram (LEXAPRO) 20 MG tablet Take 1 tablet (20 mg total) by mouth daily. 90 tablet 3  . Krill Oil CAPS 1 krill oil caps daily, MegaRed caps by Schiff    . pramipexole (MIRAPEX) 0.5 MG tablet Take 2 tablets in the morning, 1 tablet in the afternoon, 1 in the evening 360 tablet 3  . rosuvastatin (CRESTOR) 10 MG tablet TAKE 1 TABLET BY MOUTH ON MONDAYS, WEDNESDAYS, AND FRIDAYS 90 tablet 2  . ALPRAZolam (XANAX) 0.5 MG tablet Take 1 tablet (0.5 mg total) by mouth as needed for anxiety or sleep. 30 tablet 1  . omeprazole (PRILOSEC) 20 MG capsule Take 1 capsule (20 mg total) by mouth every other day. 45 capsule 1  . telmisartan-hydrochlorothiazide (MICARDIS HCT) 80-25 MG per tablet 1/2 tab po daily 90 tablet 3   No facility-administered medications prior to visit.    Allergies  Allergen Reactions  . Bactrim [Sulfamethoxazole-Trimethoprim] Other (See Comments)    Oral ulcers and rash  .  Penicillins Hives    Review of Systems  Constitutional: Positive for malaise/fatigue. Negative for fever and chills.  HENT: Negative for congestion and hearing loss.   Eyes: Negative for discharge.  Respiratory: Negative for cough, sputum production and shortness of breath.   Cardiovascular: Negative for chest pain, palpitations and leg swelling.  Gastrointestinal: Negative for heartburn, nausea, vomiting, abdominal pain, diarrhea, constipation and blood in stool.  Genitourinary: Negative for dysuria, urgency, frequency and hematuria.  Musculoskeletal: Negative for myalgias, back pain and falls.  Skin: Negative for rash.  Neurological: Positive for tremors and weakness. Negative for dizziness, sensory change, loss of consciousness and headaches.  Endo/Heme/Allergies: Negative for environmental allergies. Does not bruise/bleed easily.  Psychiatric/Behavioral: Negative for depression and suicidal ideas. The patient is not nervous/anxious and does not have insomnia.        Objective:    Physical Exam  Constitutional: She is oriented to person, place, and time. She appears well-developed and well-nourished. No distress.  HENT:  Head: Normocephalic and atraumatic.  Eyes: Conjunctivae are normal.  Neck: Neck supple. No thyromegaly present.  Cardiovascular: Normal rate, regular rhythm and normal heart sounds.   No murmur heard. Pulmonary/Chest: Effort normal and breath sounds normal. No respiratory distress.  Abdominal: Soft. Bowel sounds are normal. She exhibits no distension and no mass. There is no tenderness.  Musculoskeletal: She exhibits no edema.  Lymphadenopathy:    She has no cervical adenopathy.  Neurological: She is alert and oriented to person, place, and time.  Skin: Skin is warm and dry.  Psychiatric: She has a normal mood and affect. Her behavior is normal.    BP 120/78 mmHg  Pulse 85  Temp(Src) 98 F (36.7 C) (Oral)  Ht 5\' 5"  (1.651 m)  Wt 230 lb 4 oz (104.441 kg)   BMI 38.32 kg/m2  SpO2 95% Wt Readings from Last 3 Encounters:  12/07/15 230 lb 4 oz (104.441 kg)  12/01/15 232 lb (105.235 kg)  08/12/15 231 lb (104.781 kg)     Lab Results  Component Value Date   WBC 5.7 12/07/2015   HGB 12.4 12/07/2015   HCT 37.1 12/07/2015   PLT 234.0 12/07/2015   GLUCOSE 87 12/07/2015   CHOL 175 12/07/2015   TRIG 97.0 12/07/2015   HDL 59.30 12/07/2015   LDLDIRECT 161.5 10/07/2008   LDLCALC 96 12/07/2015   ALT 18 12/07/2015   AST 22 12/07/2015  NA 141 12/07/2015   K 3.8 12/07/2015   CL 103 12/07/2015   CREATININE 0.85 12/07/2015   BUN 18 12/07/2015   CO2 29 12/07/2015   TSH 1.44 12/07/2015   INR 2.5* 01/19/2009   HGBA1C 6.1 12/07/2015    Lab Results  Component Value Date   TSH 1.44 12/07/2015   Lab Results  Component Value Date   WBC 5.7 12/07/2015   HGB 12.4 12/07/2015   HCT 37.1 12/07/2015   MCV 84.9 12/07/2015   PLT 234.0 12/07/2015   Lab Results  Component Value Date   NA 141 12/07/2015   K 3.8 12/07/2015   CO2 29 12/07/2015   GLUCOSE 87 12/07/2015   BUN 18 12/07/2015   CREATININE 0.85 12/07/2015   BILITOT 0.6 12/07/2015   ALKPHOS 67 12/07/2015   AST 22 12/07/2015   ALT 18 12/07/2015   PROT 7.2 12/07/2015   ALBUMIN 4.2 12/07/2015   CALCIUM 9.7 12/07/2015   GFR 71.21 12/07/2015   Lab Results  Component Value Date   CHOL 175 12/07/2015   Lab Results  Component Value Date   HDL 59.30 12/07/2015   Lab Results  Component Value Date   LDLCALC 96 12/07/2015   Lab Results  Component Value Date   TRIG 97.0 12/07/2015   Lab Results  Component Value Date   CHOLHDL 3 12/07/2015   Lab Results  Component Value Date   HGBA1C 6.1 12/07/2015       Assessment & Plan:   Problem List Items Addressed This Visit    Anemia   Relevant Orders   CBC (Completed)   TSH (Completed)   Comprehensive metabolic panel (Completed)   Lipid panel (Completed)   Hemoglobin A1c (Completed)   Hepatitis C antibody (Completed)    Urinalysis (Completed)   Urine culture (Completed)   Constipation    Encouraged increased hydration and fiber in diet. Daily probiotics. If bowels not moving can use MOM 2 tbls po in 4 oz of warm prune juice by mouth every 2-3 days. If no results then repeat in 4 hours with  Dulcolax suppository pr, may repeat again in 4 more hours as needed. Seek care if symptoms worsen. Consider daily Miralax and/or Dulcolax if symptoms persist. NOW probiotic 10 strains 1 cap daily      Relevant Orders   CBC (Completed)   TSH (Completed)   Comprehensive metabolic panel (Completed)   Lipid panel (Completed)   Hemoglobin A1c (Completed)   Hepatitis C antibody (Completed)   Urinalysis (Completed)   Urine culture (Completed)   Depression with anxiety   Relevant Orders   CBC (Completed)   TSH (Completed)   Comprehensive metabolic panel (Completed)   Lipid panel (Completed)   Hemoglobin A1c (Completed)   Hepatitis C antibody (Completed)   Urinalysis (Completed)   Urine culture (Completed)   DYSPHAGIA PHARYNGOESOPHAGEAL PHASE    Speech therapy with neurorehab is working this up with some swallow test      Relevant Orders   CBC (Completed)   TSH (Completed)   Comprehensive metabolic panel (Completed)   Lipid panel (Completed)   Hemoglobin A1c (Completed)   Hepatitis C antibody (Completed)   Urinalysis (Completed)   Urine culture (Completed)   Essential hypertension    Well controlled, no changes to meds. Encouraged heart healthy diet such as the DASH diet and exercise as tolerated.       Relevant Medications   telmisartan-hydrochlorothiazide (MICARDIS HCT) 80-12.5 MG tablet   Other Relevant Orders   CBC (  Completed)   TSH (Completed)   Comprehensive metabolic panel (Completed)   Lipid panel (Completed)   Hemoglobin A1c (Completed)   Hepatitis C antibody (Completed)   Urinalysis (Completed)   Urine culture (Completed)   GERD - Primary    Avoid offending foods, start probiotics. Do not  eat large meals in late evening and consider raising head of bed.       Relevant Medications   omeprazole (PRILOSEC) 20 MG capsule   Other Relevant Orders   CBC (Completed)   TSH (Completed)   Comprehensive metabolic panel (Completed)   Lipid panel (Completed)   Hemoglobin A1c (Completed)   Hepatitis C antibody (Completed)   Urinalysis (Completed)   Urine culture (Completed)   Hyperglycemia     minimize simple carbs. Increase exercise as tolerated. Hgba1c is acceptable      Relevant Orders   CBC (Completed)   TSH (Completed)   Comprehensive metabolic panel (Completed)   Lipid panel (Completed)   Hemoglobin A1c (Completed)   Hepatitis C antibody (Completed)   Urinalysis (Completed)   Urine culture (Completed)   Hyperlipidemia    Tolerating statin, encouraged heart healthy diet, avoid trans fats, minimize simple carbs and saturated fats. Increase exercise as tolerated      Relevant Medications   telmisartan-hydrochlorothiazide (MICARDIS HCT) 80-12.5 MG tablet   Other Relevant Orders   CBC (Completed)   TSH (Completed)   Comprehensive metabolic panel (Completed)   Lipid panel (Completed)   Hemoglobin A1c (Completed)   Hepatitis C antibody (Completed)   Urinalysis (Completed)   Urine culture (Completed)   Lumbar back pain with radiculopathy affecting left lower extremity   Relevant Medications   ALPRAZolam (XANAX) 0.5 MG tablet   Other Relevant Orders   CBC (Completed)   TSH (Completed)   Comprehensive metabolic panel (Completed)   Lipid panel (Completed)   Hemoglobin A1c (Completed)   Hepatitis C antibody (Completed)   Urinalysis (Completed)   Urine culture (Completed)   Obesity    Encouraged DASH diet, decrease po intake and increase exercise as tolerated. Needs 7-8 hours of sleep nightly. Avoid trans fats, eat small, frequent meals every 4-5 hours with lean proteins, complex carbs and healthy fats. Minimize simple carbs, GMO foods.      Parkinson's disease  ([place])    Following with [facility], right side is weaker and slower, increased tremor. Also some twisting and weakness in right ankle      Relevant Orders   CBC (Completed)   TSH (Completed)   Comprehensive metabolic panel (Completed)   Lipid panel (Completed)   Hemoglobin A1c (Completed)   Hepatitis C antibody (Completed)   Urinalysis (Completed)   Urine culture (Completed)   Preventative health care    Patient encouraged to maintain heart healthy diet, regular exercise, adequate sleep. Consider daily probiotics. Take medications as prescribed      Urinary frequency    Incontinence, will check UA with c&s, start Kegel's bid      Relevant Orders   CBC (Completed)   TSH (Completed)   Comprehensive metabolic panel (Completed)   Lipid panel (Completed)   Hemoglobin A1c (Completed)   Hepatitis C antibody (Completed)   Urinalysis (Completed)   Urine culture (Completed)    Other Visit Diagnoses    Postmenopausal estrogen deficiency        Relevant Orders    DG Bone Density (Completed)    Other urinary incontinence        Relevant Orders    Urinalysis (Completed)  Urine culture (Completed)    Need for viral immunization        Relevant Medications    zoster vaccine live, PF, (ZOSTAVAX) 57846 UNT/0.65ML injection    Need for 23-polyvalent pneumococcal polysaccharide vaccine        Relevant Orders    Pneumococcal polysaccharide vaccine 23-valent greater than or equal to 2yo subcutaneous/IM (Completed)       I have discontinued Ms. Carreno telmisartan-hydrochlorothiazide. I have also changed her omeprazole. Additionally, I am having her start on zoster vaccine live (PF) and telmisartan-hydrochlorothiazide. Lastly, I am having her maintain her Krill Oil, bisacodyl, rosuvastatin, escitalopram, pramipexole, DULoxetine, and ALPRAZolam.  Meds ordered this encounter  Medications  . omeprazole (PRILOSEC) 20 MG capsule    Sig: Take 1 capsule (20 mg total) by mouth daily.     Dispense:  90 capsule    Refill:  1  . ALPRAZolam (XANAX) 0.5 MG tablet    Sig: Take 1 tablet (0.5 mg total) by mouth as needed for anxiety or sleep.    Dispense:  30 tablet    Refill:  1  . zoster vaccine live, PF, (ZOSTAVAX) 96295 UNT/0.65ML injection    Sig: Inject 19,400 Units into the skin once.    Dispense:  1 each    Refill:  0  . telmisartan-hydrochlorothiazide (MICARDIS HCT) 80-12.5 MG tablet    Sig: Take 1 tablet by mouth daily.    Dispense:  90 tablet    Refill:  1     [name], MD

## 2015-12-07 NOTE — Telephone Encounter (Signed)
We have scheduled you at [facility] for your modified barium swallow on 12/15/15 at 1:00 pm. Please arrive 15 minutes prior and go to 1st floor radiology. If you need to reschedule for any reason please call [phone].   Patient called back and made her aware of need for MBE. Set up (see above information). Patient made aware of appt information.

## 2015-12-07 NOTE — Assessment & Plan Note (Addendum)
minimize simple carbs. Increase exercise as tolerated. Hgba1c is acceptable

## 2015-12-08 LAB — URINALYSIS
Bilirubin Urine: NEGATIVE
Ketones, ur: NEGATIVE
Leukocytes, UA: NEGATIVE
Nitrite: NEGATIVE
Specific Gravity, Urine: 1.025 (ref 1.000–1.030)
Total Protein, Urine: NEGATIVE
Urine Glucose: NEGATIVE
Urobilinogen, UA: 0.2 (ref 0.0–1.0)
pH: 5 (ref 5.0–8.0)

## 2015-12-08 LAB — TSH: TSH: 1.44 u[IU]/mL (ref 0.35–4.50)

## 2015-12-08 LAB — COMPREHENSIVE METABOLIC PANEL
ALT: 18 U/L (ref 0–35)
AST: 22 U/L (ref 0–37)
Albumin: 4.2 g/dL (ref 3.5–5.2)
Alkaline Phosphatase: 67 U/L (ref 39–117)
BUN: 18 mg/dL (ref 6–23)
CO2: 29 mEq/L (ref 19–32)
Calcium: 9.7 mg/dL (ref 8.4–10.5)
Chloride: 103 mEq/L (ref 96–112)
Creatinine, Ser: 0.85 mg/dL (ref 0.40–1.20)
GFR: 71.21 mL/min (ref >60.00)
Glucose, Bld: 87 mg/dL (ref 70–99)
Potassium: 3.8 mEq/L (ref 3.5–5.1)
Sodium: 141 mEq/L (ref 135–145)
Total Bilirubin: 0.6 mg/dL (ref 0.2–1.2)
Total Protein: 7.2 g/dL (ref 6.0–8.3)

## 2015-12-08 LAB — LIPID PANEL
Cholesterol: 175 mg/dL (ref 0–200)
HDL: 59.3 mg/dL (ref >39.00)
LDL Cholesterol: 96 mg/dL (ref 0–99)
NonHDL: 115.79
Total CHOL/HDL Ratio: 3
Triglycerides: 97 mg/dL (ref 0.0–149.0)
VLDL: 19.4 mg/dL (ref 0.0–40.0)

## 2015-12-08 LAB — CBC
HCT: 37.1 % (ref 36.0–46.0)
Hemoglobin: 12.4 g/dL (ref 12.0–15.0)
MCHC: 33.3 g/dL (ref 30.0–36.0)
MCV: 84.9 fl (ref 78.0–100.0)
Platelets: 234 10*3/uL (ref 150.0–400.0)
RBC: 4.37 Mil/uL (ref 3.87–5.11)
RDW: 14.6 % (ref 11.5–15.5)
WBC: 5.7 10*3/uL (ref 4.0–10.5)

## 2015-12-08 LAB — HEPATITIS C ANTIBODY: HCV Ab: NEGATIVE

## 2015-12-08 LAB — HEMOGLOBIN A1C: Hgb A1c MFr Bld: 6.1 % (ref 4.6–6.5)

## 2015-12-09 LAB — URINE CULTURE
Colony Count: NO GROWTH
Organism ID, Bacteria: NO GROWTH

## 2015-12-10 ENCOUNTER — Ambulatory Visit (HOSPITAL_BASED_OUTPATIENT_CLINIC_OR_DEPARTMENT_OTHER)
Admission: RE | Admit: 2015-12-10 | Discharge: 2015-12-10 | Disposition: A | Discharge status: Home / Self Care | Payer: Medicare Other | Source: Ambulatory Visit | Attending: Family Medicine | Admitting: Family Medicine

## 2015-12-10 DIAGNOSIS — Z780 Asymptomatic menopausal state: Secondary | ICD-10-CM | POA: Diagnosis not present

## 2015-12-10 DIAGNOSIS — Z13820 Encounter for screening for osteoporosis: Secondary | ICD-10-CM | POA: Diagnosis not present

## 2015-12-15 ENCOUNTER — Ambulatory Visit (HOSPITAL_COMMUNITY)
Admission: RE | Admit: 2015-12-15 | Discharge: 2015-12-15 | Disposition: A | Discharge status: Home / Self Care | Payer: Medicare Other | Source: Ambulatory Visit | Attending: Neurology | Admitting: Neurology

## 2015-12-15 DIAGNOSIS — R1319 Other dysphagia: Secondary | ICD-10-CM

## 2015-12-15 DIAGNOSIS — R1310 Dysphagia, unspecified: Secondary | ICD-10-CM | POA: Diagnosis not present

## 2015-12-15 DIAGNOSIS — G20 Parkinson's disease: Secondary | ICD-10-CM | POA: Insufficient documentation

## 2015-12-19 ENCOUNTER — Encounter: Payer: Self-pay | Admitting: Family Medicine

## 2015-12-19 DIAGNOSIS — Z0000 Encounter for general adult medical examination without abnormal findings: Secondary | ICD-10-CM

## 2015-12-19 HISTORY — DX: Encounter for general adult medical examination without abnormal findings: Z00.00

## 2015-12-19 NOTE — Assessment & Plan Note (Signed)
Encouraged DASH diet, decrease po intake and increase exercise as tolerated. Needs 7-8 hours of sleep nightly. Avoid trans fats, eat small, frequent meals every 4-5 hours with lean proteins, complex carbs and healthy fats. Minimize simple carbs, GMO foods. 

## 2015-12-19 NOTE — Assessment & Plan Note (Signed)
Patient encouraged to maintain heart healthy diet, regular exercise, adequate sleep. Consider daily probiotics. Take medications as prescribed 

## 2016-01-06 ENCOUNTER — Other Ambulatory Visit: Payer: Self-pay | Admitting: Family Medicine

## 2016-01-11 ENCOUNTER — Ambulatory Visit: Payer: Medicare Other | Admitting: Physical Therapy

## 2016-01-11 ENCOUNTER — Ambulatory Visit: Payer: Medicare Other | Admitting: Occupational Therapy

## 2016-01-11 ENCOUNTER — Encounter: Payer: BC Managed Care – PPO | Admitting: Family Medicine

## 2016-01-13 ENCOUNTER — Other Ambulatory Visit: Payer: Self-pay | Admitting: Family Medicine

## 2016-01-17 ENCOUNTER — Other Ambulatory Visit: Payer: Self-pay | Admitting: Family Medicine

## 2016-02-15 ENCOUNTER — Ambulatory Visit: Payer: Medicare Other | Admitting: Occupational Therapy

## 2016-02-15 ENCOUNTER — Ambulatory Visit: Payer: Medicare Other | Admitting: Physical Therapy

## 2016-02-18 ENCOUNTER — Other Ambulatory Visit: Payer: Self-pay | Admitting: Family Medicine

## 2016-02-28 ENCOUNTER — Encounter: Payer: Medicare Other | Admitting: Occupational Therapy

## 2016-02-28 ENCOUNTER — Ambulatory Visit: Payer: Medicare Other | Admitting: Physical Therapy

## 2016-03-02 ENCOUNTER — Ambulatory Visit: Payer: Medicare Other | Admitting: Neurology

## 2016-03-14 ENCOUNTER — Encounter: Payer: Self-pay | Admitting: Neurology

## 2016-03-14 ENCOUNTER — Ambulatory Visit (INDEPENDENT_AMBULATORY_CARE_PROVIDER_SITE_OTHER): Payer: Medicare Other | Admitting: Neurology

## 2016-03-14 VITALS — BP 120/78 | HR 74 | Ht 64.0 in | Wt 231.0 lb

## 2016-03-14 DIAGNOSIS — G20 Parkinson's disease: Secondary | ICD-10-CM

## 2016-03-14 DIAGNOSIS — F418 Other specified anxiety disorders: Secondary | ICD-10-CM

## 2016-03-14 MED ORDER — CARBIDOPA-LEVODOPA 25-100 MG PO TABS: 1.0000 | ORAL_TABLET | Freq: Three times a day (TID) | ORAL | Status: DC

## 2016-03-14 NOTE — Patient Instructions (Addendum)
1. Continue Pramipexole Start Carbidopa Levodopa as follows:  Take 1/2 tablet three times daily, at least 30 minutes before meals, for one week  Then take 1/2 tablet in the morning, 1/2 tablet in the afternoon, 1 tablet in the evening, at least 30 minutes before meals, for one week  Then take 1/2 tablet in the morning, 1 tablet in the afternoon, 1 tablet in the evening, at least 30 minutes before meals, for one week  Then take 1 tablet three times daily, at least 30 minutes before meals

## 2016-03-14 NOTE — Addendum Note (Signed)
Addended byAnnamaria Helling on: 03/14/2016 09:12 AM   Modules accepted: Orders

## 2016-03-14 NOTE — Progress Notes (Signed)
Subjective:    Kristin [name] was seen in consultation in the movement disorder clinic at the request of [name], MD.  The evaluation is for tremor.  The patient is a 65 y.o. left handed female with a history of tremor.  The records that were made available to me were reviewed.  According to records, the patient has complained about tremor to her previous primary care physician all the way back to 2012, but in those records she had said that tremor had been going on for years.  She states today, however, that she really thinks that it started in 2012.  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  R hand was always worse even though she is L hand dominant.  It has slowly progressed.  She reports that tremor is in both hands now (R still worse) but she feels tremor on the inside of the body all of the time.  She has tremor at rest now.  She feels nervous now and is unsure if it was related.  She also has the sweats and so she was placed on xanax but that doesn't help the sweating.  She recently went to an exercise class and noted that she wasn't as coordinated/strong with the R hand and that worried her and that is why she wanted a further evaluation.    She did see [name] in June, 2013 and I reviewed that note.  She was diagnosed with essential tremor.  No treatment was recommended.  There is  family hx of tremor in her father.  She states that she has been on metoprolol since 2012 and didn't think that it helped but when she tried to get off of it a year ago, the tremor got worse and she couldn't get off of it.    03/09/14 update:  The patient presents today for follow up.  Today, she is accompanied by her husband who helps to supplement the history.  He was not present on the prior visit.  She was diagnosed with PD last visit.  I started her on Mirapex.  She has been doing better in terms of tremor.  Interestingly, sweats and sleep have been better.  She  just retired last week.  No side effects with the Mirapex.  She is currently taking it at 9 AM/2 PM/9 PM. No compulsive behaviors.  No sleep attacks.  She has been attending therapy.  She is planning on starting the Parkinson's exercise class.  She has been educating herself and reading patient education material.  She had a modified barium evaluation on 02/25/2014.  It was normal, although they suspected that she had mild esophageal dysphagia.  Her husband asks multiple questions today and asked about potentially having another opinion.  The patient has had no falls.  No hallucinations.  07/15/14 update:  Pt is f/u today, accompanied by her husband who supplements the history.  Pt is on mirapex 0.5 mg three times per day.  I stopped her metoprolol last visit. She did fine with that. She is exercising with the Moves class and with circuit II class.  She is also enrolled in the bike class at the [facility].  She is having some soreness because of the exercise and hip pain because of it and she has had a hip replacement and worries about that as she doesn't want to have another.  Thinking about trying chiropractics for that.  She asks me about some.  Stages that she has on the lateral aspect of the knees.  She does not wear boots.  She has not had a knee replacement.  She also complains of some achiness in her toes and it gets better as she walks.  No falls.  No lightheadedness.    11/11/14 update:  The patient returns today for follow-up.  She is on Mirapex, 0.5 mg 3 times per day.  Since our last visit, the patient did go to [facility] for a second opinion.  She saw Dr. [name].  This was just 2 days ago.  No notes are available.  They state that they had a good visit, are glad that they went and got a second opinion and no new recommendations/treatments were given.  She has been doing well at home.  She is very active in our community exercise programs for PD.  She is enrolled in the [facility] exercise program and in our  circuit class and is walking on the treadmill.  She notices tremor in public but generally not as much at home.  She notes wearing off of the medication but only when time for the next dosage.    02/09/15 update:  The patient is following up today regarding her Parkinson's disease.  She is accompanied by her husband who supplements the history.  She did get a second opinion at [place] with Dr. [name] and his fellow, and [name], on 11/17/2014.  I reviewed those notes.  I told her that she could increase the Mirapex if needed to control tremor further.  She is currently on Mirapex 0.5 mg 3 times per day (7am/1:30 pm/7:00).  She is on Lexapro and Cymbalta for depression.  She is doing exercise.   She went to circuit class for PD today and she will have tremor when doing that.  She notes that when doing exercise, she has more tremor.  No falls.  She went to [place] for 2 weeks with a tour group and she felt that she was able to keep up with the group.  She felt better on this trip than she did 2 years ago on a trip.  05/12/15 update:  The patient falls up today regarding her Parkinson's disease. She is accompanied by her husband who supplements the history.   Last visit, I had her increase her Mirapex to 1 mg in the morning and she remained on 0.5 mg in the afternoon and 0.5 mg in the evening. She was initially tired with the increase in the medication but has adjusted now.  Her daughter and 8 month old grandson are living with them.  She is exercising on the treadmill.  She is doing the Parkinsons exercise class.  She is going to try the boxing class and is in the [employer] class and it is increasing to 2 days a week. She has been doing well with mood on a combination of Lexapro and Cymbalta.  She denies any falls.  No lightheadedness or near syncope.  No hallucinations.  08/12/15 update:  The patient follows up today, accompanied by her husband who supplements the history.  The patient is on pramipexole, 1.0 mg in the  morning, 0.5 mg in the afternoon and 0.5 mg in the evening.  She is also planning on attending the [facility] retreat as a patient, which should be very good for her.  She remains on Lexapro and Cymbalta.  They're working well for her.  She is thinking about getting a therapist and asks me about  that.  She also asks me about possible supplements for Parkinson's disease.  She continues to exercise a lot.  She has not had any falls.  No dyskinesia.  No lightheadedness or near syncope.  12/01/15 update:  The patient follows up today, accompanied by her husband who supplements the history.  The patient is on pramipexole, 1.0 mg in the morning, 0.5 mg in the afternoon and 0.5 mg in the evening.  She is also planning on attending the [facility] retreat as a patient, which should be very good for her.  Its in [place].  She remains on Lexapro and Cymbalta.  They're working well for her.  She continues to exercise a lot.  Had not been pole walking and just started and noted that her arms are very sore.  Still knows that she needs a therapist and asked about it last time and gave her Valeda Malm name but she hasn't done it.  She has not had any falls.  No dyskinesia.  No lightheadedness or near syncope.  She just saw Dr. [name] on 11/08/15 and I reviewed those notes.  No changes were made.    03/14/16 update:  The patient follows up today for her PD.  The patient is on pramipexole, 1.0 mg in the morning, 0.5 mg in the afternoon and 0.5 mg in the evening.  She attended the [facility] retreat in [place] as a patient.  She went at the end of May.  States that it was very challenging.   She remains on Lexapro and Cymbalta.  They're working well for her.  She continues to exercise a lot.  She has not had any falls.  No dyskinesia.  No lightheadedness or near syncope.  Noting some turning in of the L ankle. Not sure if related to timing of medication.   States that husband was dx with MDS since our last visit.  Because of that, she has not she has  not been able to attend PD therapy locally because she has been running back and forth to [place] with him.  Sister with dx with PD since our last visit.    Current/Previously tried tremor medications: metoprololol  Current medications that may exacerbate tremor:  Cymbalta, although I reviewed that the patient did try to change to Pristiq but the patient had nightmares with Pristiq and changed back to Cymbalta  Outside reports reviewed: historical medical records, lab reports and referral letter/letters.  Allergies  Allergen Reactions  . Bactrim [Sulfamethoxazole-Trimethoprim] Other (See Comments)    Oral ulcers and rash  . Penicillins Hives    Current Outpatient Prescriptions on File Prior to Visit  Medication Sig Dispense Refill  . ALPRAZolam (XANAX) 0.5 MG tablet Take 1 tablet (0.5 mg total) by mouth as needed for anxiety or sleep. 30 tablet 1  . DULoxetine (CYMBALTA) 30 MG capsule take 1 capsule by mouth once daily 90 capsule 1  . escitalopram (LEXAPRO) 20 MG tablet take 1 tablet by mouth once daily 90 tablet 1  . Krill Oil CAPS 1 krill oil caps daily, MegaRed caps by Schiff    . omeprazole (PRILOSEC) 20 MG capsule Take 1 capsule (20 mg total) by mouth daily. 90 capsule 1  . pramipexole (MIRAPEX) 0.5 MG tablet Take 2 tablets in the morning, 1 tablet in the afternoon, 1 in the evening 360 tablet 3  . rosuvastatin (CRESTOR) 10 MG tablet TAKE 1 TABLET BY MOUTH ON MONDAYS, WEDNESDAY, AND FRIDAYS 90 tablet 2  . telmisartan-hydrochlorothiazide (MICARDIS HCT) 80-12.5 MG  tablet Take 1 tablet by mouth daily. (Patient taking differently: Take 0.5 tablets by mouth daily. ) 90 tablet 1   No current facility-administered medications on file prior to visit.    Past Medical History  Diagnosis Date  . Fibroids   . Abnormal vaginal Pap smear   . Anemia   . GERD (gastroesophageal reflux disease)   . Hyperlipidemia   . Hypertension   . Arthritis   . Anxiety   . Obesity   . Hiatal hernia   .  Esophageal stricture 1994  . Chronic UTI     sees dr Terance Hart  . Measles as a child  . Chicken pox as a child  . 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 Depression with anxiety 11/02/2009    Qualifier: Diagnosis of  By: [name], LPN, [name]   . Plantar fasciitis of left foot 06/19/2012  . Dermatitis 07/17/2012  . Urinary frequency 02/25/2013  . Acute pharyngitis 02/25/2013  . Hyperglycemia 08/19/2013  . Visual floaters 05/11/2014  . Foot pain, bilateral 06/19/2012  . BCC (basal cell carcinoma of skin) 10/05/2014    On back  . Rosacea 10/05/2014  . Constipation 12/07/2015  . Preventative health care 12/19/2015    Past Surgical History  Procedure Laterality Date  . Wisdom teeth extracted    . Laporoscopy    . Tonsillectomy    . Abdominal hysterectomy  2006    total  . Partial hip arthroplasty  2010    right  . Esophageal      stretching    Social History   Social History  . Marital Status: Married    Spouse Name: N/A  . Number of Children: N/A  . Years of Education: N/A   Occupational History  . Not on file.   Social History Main Topics  . Smoking status: Never Smoker   . Smokeless tobacco: Never Used  . Alcohol Use: 2.4 oz/week    4 Glasses of wine per week     Comment: 4 glasses of wine weekly  . Drug Use: No  . Sexual Activity:    Partners: Male   Other Topics Concern  . Not on file   Social History Narrative   Lives with husband, no major dietary restrictions, retired from teaching    Family Status  Relation Status Death Age  . Mother Deceased 48    Dementia, breast cancer, bipolar  . Father Deceased 24    heart failure, rheumatoid arthritis  . Sister Alive     arthritis  . Sister Alive   . Daughter Alive     30  . Son Alive     91  . Maternal Grandmother Deceased   . Maternal Grandfather Deceased   . Paternal Grandmother Deceased   . Paternal Grandfather Deceased   . Sister Alive      healthy  . Sister Alive     healthy  . Daughter Alive      28/ healthy    Review of Systems A complete 10 system ROS was obtained and was negative apart from what is mentioned.   Objective:   VITALS:   Filed Vitals:   03/14/16 0823  BP: 120/78  Pulse: 74  Height: 5\' 4"  (1.626 m)  Weight: 231 lb (104.781 kg)   Wt Readings from Last 3 Encounters:  03/14/16 231 lb (104.781 kg)  12/07/15 230 lb 4 oz (104.441 kg)  12/01/15 232 lb (105.235 kg)     Gen:  Appears stated age and  in NAD.  She is diaphoretic (room is also hot) HEENT:  Normocephalic, atraumatic. The mucous membranes are moist.  Cardiovascular: Regular rate and rhythm Lungs: Clear to auscultation bilaterally Neck: No carotid bruits  NEUROLOGICAL:  Orientation: The patient is alert and oriented x3. Fund of knowledge is appropriate.  Recent and remote memory are intact.  Attention and concentration are normal.    Able to name objects and repeat phrases. Cranial nerves: There is good facial symmetry. The visual fields are full to confrontational testing. The speech is fluent and clear. Soft palate rises symmetrically and there is no tongue deviation. Hearing is intact to conversational tone. Sensation: Sensation is intact to light touch throughout. Motor: Strength is 5/5 in the bilateral upper and lower extremities.   Shoulder shrug is equal and symmetric.  There is no pronator drift.   Movement examination: Tone: There is mod increased tone in the RUE.    Tone in the left upper and left lower extremity was normal.  Abnormal movements: There is a near constant right upper extremity resting tremor.  There is an intermittent RLE tremor Coordination:  The only decremation is with toe taps on the right Gait and Station: The patient has no significant difficulty arising out of a deep-seated chair without the use of the hands. The patient's stride length is fairly normal, but there is marked decreased arm swing on the right and the R arm is held out to the side.  The patient has a  negative pull test.      LABS  Lab Results  Component Value Date   TSH 1.44 12/07/2015   Lab Results  Component Value Date   WBC 5.7 12/07/2015   HGB 12.4 12/07/2015   HCT 37.1 12/07/2015   MCV 84.9 12/07/2015   PLT 234.0 12/07/2015     Chemistry      Component Value Date/Time   NA 141 12/07/2015 1455   K 3.8 12/07/2015 1455   CL 103 12/07/2015 1455   CO2 29 12/07/2015 1455   BUN 18 12/07/2015 1455   CREATININE 0.85 12/07/2015 1455   CREATININE 0.71 01/04/2015 1046      Component Value Date/Time   CALCIUM 9.7 12/07/2015 1455   ALKPHOS 67 12/07/2015 1455   AST 22 12/07/2015 1455   ALT 18 12/07/2015 1455   BILITOT 0.6 12/07/2015 1455          Assessment/Plan:   1. idiopathic Parkinson's disease.  The patient has tremor, bradykinesia, rigidity and mild postural instability.  -continue pramipexole 1 mg in the morning and continue the 0.5 mg in the afternoon and evening.  She has had no compulsive side effects.  Risks, benefits, side effects and alternative therapies were discussed.  The opportunity to ask questions was given and they were answered to the best of my ability.  The patient expressed understanding and willingness to follow the outlined treatment protocols.  -decided to start carbidopa/levodopa 25/100 and work to tid.  Risks, benefits, side effects and alternative therapies were discussed.  The opportunity to ask questions was given and they were answered to the best of my ability.  The patient expressed understanding and willingness to follow the outlined treatment protocols.  -told her to watch to see if ankle turn is related to timing of meds.  She thinks that she can recall that ankle has turned in every since she was a child  -talked about water intake and needs to greatly increase water hydration 2.  Depression and anxiety  -  This certainly could be associated with Parkinson's disease, or could be independent of it.  She is on a combination of Lexapro and  Cymbalta and she is doing well.    -I talked to her about potentially seeing Amada Jupiter.  Discussed this last visit but she didn't do it and discussed again today, especially with husbands new dx of MDS  -asked about anxiety but I don't recommend that 3.  Follow-up in the next 3 months, sooner should new neurologic issues arise.  Much greater than 50% of this visit was spent in counseling and coordinating care.  Total face to face time:  40 min discussing importance of taking time for self care and safety in PD

## 2016-04-07 ENCOUNTER — Ambulatory Visit: Payer: Medicare Other | Admitting: Family Medicine

## 2016-04-11 DIAGNOSIS — H524 Presbyopia: Secondary | ICD-10-CM | POA: Diagnosis not present

## 2016-04-11 DIAGNOSIS — H43813 Vitreous degeneration, bilateral: Secondary | ICD-10-CM | POA: Diagnosis not present

## 2016-04-17 ENCOUNTER — Other Ambulatory Visit: Payer: Self-pay | Admitting: Neurology

## 2016-05-03 ENCOUNTER — Other Ambulatory Visit: Payer: Self-pay | Admitting: Neurology

## 2016-05-04 ENCOUNTER — Encounter: Payer: Self-pay | Admitting: Family Medicine

## 2016-05-04 ENCOUNTER — Ambulatory Visit (INDEPENDENT_AMBULATORY_CARE_PROVIDER_SITE_OTHER): Payer: Medicare Other | Admitting: Family Medicine

## 2016-05-04 VITALS — BP 109/64 | HR 85 | Temp 98.7°F | Ht 64.0 in | Wt 236.0 lb

## 2016-05-04 DIAGNOSIS — R739 Hyperglycemia, unspecified: Secondary | ICD-10-CM | POA: Diagnosis not present

## 2016-05-04 DIAGNOSIS — G20 Parkinson's disease: Secondary | ICD-10-CM | POA: Diagnosis not present

## 2016-05-04 DIAGNOSIS — E669 Obesity, unspecified: Secondary | ICD-10-CM

## 2016-05-04 DIAGNOSIS — E785 Hyperlipidemia, unspecified: Secondary | ICD-10-CM

## 2016-05-04 DIAGNOSIS — I10 Essential (primary) hypertension: Secondary | ICD-10-CM | POA: Diagnosis not present

## 2016-05-04 DIAGNOSIS — F418 Other specified anxiety disorders: Secondary | ICD-10-CM

## 2016-05-04 DIAGNOSIS — F4322 Adjustment disorder with anxiety: Secondary | ICD-10-CM | POA: Diagnosis not present

## 2016-05-04 DIAGNOSIS — Z23 Encounter for immunization: Secondary | ICD-10-CM

## 2016-05-04 DIAGNOSIS — Z0000 Encounter for general adult medical examination without abnormal findings: Secondary | ICD-10-CM | POA: Diagnosis not present

## 2016-05-04 DIAGNOSIS — K219 Gastro-esophageal reflux disease without esophagitis: Secondary | ICD-10-CM

## 2016-05-04 DIAGNOSIS — R1314 Dysphagia, pharyngoesophageal phase: Secondary | ICD-10-CM

## 2016-05-04 LAB — COMPREHENSIVE METABOLIC PANEL
ALT: 5 U/L (ref 0–35)
AST: 19 U/L (ref 0–37)
Albumin: 4.1 g/dL (ref 3.5–5.2)
Alkaline Phosphatase: 68 U/L (ref 39–117)
BUN: 15 mg/dL (ref 6–23)
CO2: 29 mEq/L (ref 19–32)
Calcium: 9.1 mg/dL (ref 8.4–10.5)
Chloride: 105 mEq/L (ref 96–112)
Creatinine, Ser: 0.76 mg/dL (ref 0.40–1.20)
GFR: 80.93 mL/min (ref >60.00)
Glucose, Bld: 94 mg/dL (ref 70–99)
Potassium: 4 mEq/L (ref 3.5–5.1)
Sodium: 140 mEq/L (ref 135–145)
Total Bilirubin: 0.6 mg/dL (ref 0.2–1.2)
Total Protein: 6.9 g/dL (ref 6.0–8.3)

## 2016-05-04 LAB — LIPID PANEL
Cholesterol: 179 mg/dL (ref 0–200)
HDL: 59.1 mg/dL (ref >39.00)
LDL Cholesterol: 94 mg/dL (ref 0–99)
NonHDL: 119.88
Total CHOL/HDL Ratio: 3
Triglycerides: 131 mg/dL (ref 0.0–149.0)
VLDL: 26.2 mg/dL (ref 0.0–40.0)

## 2016-05-04 LAB — CBC
HCT: 37.1 % (ref 36.0–46.0)
Hemoglobin: 12.3 g/dL (ref 12.0–15.0)
MCHC: 33.1 g/dL (ref 30.0–36.0)
MCV: 83.9 fl (ref 78.0–100.0)
Platelets: 226 10*3/uL (ref 150.0–400.0)
RBC: 4.42 Mil/uL (ref 3.87–5.11)
RDW: 15.1 % (ref 11.5–15.5)
WBC: 5.5 10*3/uL (ref 4.0–10.5)

## 2016-05-04 LAB — HEMOGLOBIN A1C: Hgb A1c MFr Bld: 6 % (ref 4.6–6.5)

## 2016-05-04 LAB — TSH: TSH: 1.13 u[IU]/mL (ref 0.35–4.50)

## 2016-05-04 NOTE — Assessment & Plan Note (Signed)
Husband has just been diagnosed with leukemia. She is struggling but feels she is coping well. They are receiving care at [facility] and he is awaiting a bone marrow transplant. He and their children are really struggling.

## 2016-05-04 NOTE — Progress Notes (Signed)
Patient ID: Kristin [name], female   DOB: 10/31/1950, 65 y.o.   MRN: [id number]   Subjective:    Patient ID: Kristin [name], female    DOB: 04-Nov-1950, 65 y.o.   MRN: [id number]  Chief Complaint  Patient presents with  . Follow-up    HPI Patient is in today for follow up. She is struggling with stress secondary to her husband's recent diagnosis of kleukemia. They are working with [facility] at present but he is so depressed that he is considering turning down the bone marrow transplant is is communicating very poorly. She feels the Lexapro is helping her enough to get through. No recent hospitalization or acute illness. She is tolerating Mirapex. Denies CP/palp/SOB/HA/congestion/fevers/GI or GU c/o. Taking meds as prescribed  Past Medical History:  Diagnosis Date  . Abnormal vaginal Pap smear   . Acute pharyngitis 02/25/2013  . Anemia   . Anxiety   . Arthritis   . 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 Chicken pox as a child  . Chronic UTI    sees dr Terance Hart  . Constipation 12/07/2015  . Depression with anxiety 11/02/2009   Qualifier: Diagnosis of  By: [name], LPN, [name]   . Dermatitis 07/17/2012  . Esophageal stricture 1994  . Fibroids   . Foot pain, bilateral 06/19/2012  . GERD (gastroesophageal reflux disease)   . Hiatal hernia   . Hyperglycemia 08/19/2013  . 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 Hyperlipidemia   . Hypertension   . Measles as a child  . Obesity   . Plantar fasciitis of left foot 06/19/2012  . Preventative health care 12/19/2015  . Rosacea 10/05/2014  . Urinary frequency 02/25/2013  . Visual floaters 05/11/2014    Past Surgical History:  Procedure Laterality Date  . ABDOMINAL HYSTERECTOMY  2006   total  . esophageal     stretching  . laporoscopy    . PARTIAL HIP ARTHROPLASTY  2010   right  . TONSILLECTOMY    . wisdom teeth extracted      Family History  Problem Relation Age of Onset  . Cancer Mother     breast  . Other Mother    arrythmia  . Mental illness Mother     bipolar  . Heart disease Father   . Arthritis Father     rheumatoid  . Depression Sister   . Mental illness Sister     bipolar  . Parkinson's disease Sister   . Arthritis Sister   . Colon cancer Neg Hx   . Esophageal cancer Neg Hx   . Rectal cancer Neg Hx   . Stomach cancer Neg Hx     Social History   Social History  . Marital status: Married    Spouse name: N/A  . Number of children: N/A  . Years of education: N/A   Occupational History  . Not on file.   Social History Main Topics  . Smoking status: Never Smoker  . Smokeless tobacco: Never Used  . Alcohol use 2.4 oz/week    4 Glasses of wine per week     Comment: 4 glasses of wine weekly  . Drug use: No  . Sexual activity: Yes    Partners: Male   Other Topics Concern  . Not on file   Social History Narrative   Lives with husband, no major dietary restrictions, retired from teaching    Outpatient Medications Prior to Visit  Medication Sig Dispense Refill  .  ALPRAZolam (XANAX) 0.5 MG tablet Take 1 tablet (0.5 mg total) by mouth as needed for anxiety or sleep. 30 tablet 1  . carbidopa-levodopa (SINEMET IR) 25-100 MG tablet Take 1 tablet by mouth 3 (three) times daily. 90 tablet 3  . DULoxetine (CYMBALTA) 30 MG capsule take 1 capsule by mouth once daily 90 capsule 1  . escitalopram (LEXAPRO) 20 MG tablet take 1 tablet by mouth once daily 90 tablet 1  . Krill Oil CAPS 1 krill oil caps daily, MegaRed caps by Schiff    . omeprazole (PRILOSEC) 20 MG capsule Take 1 capsule (20 mg total) by mouth daily. 90 capsule 1  . pramipexole (MIRAPEX) 0.5 MG tablet TAKE 2 TABLETS BY MOUTH IN THE MORNING 1 TABLET IN THE AFTERNOON AND 1 TABLET IN THE EVENING 360 tablet 3  . rosuvastatin (CRESTOR) 10 MG tablet TAKE 1 TABLET BY MOUTH ON MONDAYS, WEDNESDAY, AND FRIDAYS 90 tablet 2  . telmisartan-hydrochlorothiazide (MICARDIS HCT) 80-12.5 MG tablet Take 1 tablet by mouth daily. (Patient taking  differently: Take 0.5 tablets by mouth daily. ) 90 tablet 1   No facility-administered medications prior to visit.     Allergies  Allergen Reactions  . Bactrim [Sulfamethoxazole-Trimethoprim] Other (See Comments)    Oral ulcers and rash  . Penicillins Hives    Review of Systems  Constitutional: Negative for fever and malaise/fatigue.  HENT: Negative for congestion.   Eyes: Negative for blurred vision.  Respiratory: Negative for shortness of breath.   Cardiovascular: Negative for chest pain, palpitations and leg swelling.  Gastrointestinal: Negative for abdominal pain, blood in stool and nausea.  Genitourinary: Negative for dysuria and frequency.  Musculoskeletal: Negative for falls.  Skin: Negative for rash.  Neurological: Negative for dizziness, loss of consciousness and headaches.  Endo/Heme/Allergies: Negative for environmental allergies.  Psychiatric/Behavioral: Positive for depression. The patient is nervous/anxious.        Objective:    Physical Exam  Constitutional: She is oriented to person, place, and time. She appears well-developed and well-nourished. No distress.  HENT:  Head: Normocephalic and atraumatic.  Nose: Nose normal.  Eyes: Right eye exhibits no discharge. Left eye exhibits no discharge.  Neck: Normal range of motion. Neck supple.  Cardiovascular: Normal rate and regular rhythm.   No murmur heard. Pulmonary/Chest: Effort normal and breath sounds normal.  Abdominal: Soft. Bowel sounds are normal. There is no tenderness.  Musculoskeletal: She exhibits tenderness. She exhibits no edema.  Neurological: She is alert and oriented to person, place, and time.  Skin: Skin is warm and dry.  Psychiatric: She has a normal mood and affect.  Nursing note and vitals reviewed.   BP 109/64 (BP Location: Left Arm, Patient Position: Sitting, Cuff Size: Large)   Pulse 85   Temp 98.7 F (37.1 C) (Oral)   Ht 5\' 4"  (1.626 m)   Wt 236 lb (107 kg)   SpO2 96%   BMI  40.51 kg/m  Wt Readings from Last 3 Encounters:  05/04/16 236 lb (107 kg)  03/14/16 231 lb (104.8 kg)  12/07/15 230 lb 4 oz (104.4 kg)     Lab Results  Component Value Date   WBC 5.5 05/04/2016   HGB 12.3 05/04/2016   HCT 37.1 05/04/2016   PLT 226.0 05/04/2016   GLUCOSE 94 05/04/2016   CHOL 179 05/04/2016   TRIG 131.0 05/04/2016   HDL 59.10 05/04/2016   LDLDIRECT 161.5 10/07/2008   LDLCALC 94 05/04/2016   ALT 5 05/04/2016   AST 19 05/04/2016  NA 140 05/04/2016   K 4.0 05/04/2016   CL 105 05/04/2016   CREATININE 0.76 05/04/2016   BUN 15 05/04/2016   CO2 29 05/04/2016   TSH 1.13 05/04/2016   INR 2.5 (H) 01/19/2009   HGBA1C 6.0 05/04/2016    Lab Results  Component Value Date   TSH 1.13 05/04/2016   Lab Results  Component Value Date   WBC 5.5 05/04/2016   HGB 12.3 05/04/2016   HCT 37.1 05/04/2016   MCV 83.9 05/04/2016   PLT 226.0 05/04/2016   Lab Results  Component Value Date   NA 140 05/04/2016   K 4.0 05/04/2016   CO2 29 05/04/2016   GLUCOSE 94 05/04/2016   BUN 15 05/04/2016   CREATININE 0.76 05/04/2016   BILITOT 0.6 05/04/2016   ALKPHOS 68 05/04/2016   AST 19 05/04/2016   ALT 5 05/04/2016   PROT 6.9 05/04/2016   ALBUMIN 4.1 05/04/2016   CALCIUM 9.1 05/04/2016   GFR 80.93 05/04/2016   Lab Results  Component Value Date   CHOL 179 05/04/2016   Lab Results  Component Value Date   HDL 59.10 05/04/2016   Lab Results  Component Value Date   LDLCALC 94 05/04/2016   Lab Results  Component Value Date   TRIG 131.0 05/04/2016   Lab Results  Component Value Date   CHOLHDL 3 05/04/2016   Lab Results  Component Value Date   HGBA1C 6.0 05/04/2016       Assessment & Plan:   Problem List Items Addressed This Visit    Hyperlipidemia    Tolerating statin, encouraged heart healthy diet, avoid trans fats, minimize simple carbs and saturated fats. Increase exercise as tolerated      Relevant Orders   Lipid panel (Completed)   Obesity     Encouraged DASH diet, decrease po intake and increase exercise as tolerated. Needs 7-8 hours of sleep nightly. Avoid trans fats, eat small, frequent meals every 4-5 hours with lean proteins, complex carbs and healthy fats. Minimize simple carbs      Depression with anxiety    Is struggling with her husband's recent diagnosis of leukemia. He is terribly depressed and their kid's are really struggling. He has found a bone marrow match but he is heading towards declining the transplant. She is starting therapy with [facility] this week. Continue current.       Essential hypertension    Well controlled, no changes to meds. Encouraged heart healthy diet such as the DASH diet and exercise as tolerated.       Relevant Orders   TSH (Completed)   CBC (Completed)   Comprehensive metabolic panel (Completed)   GERD    Avoid offending foods, taket probiotics. Do not eat large meals in late evening and consider raising head of bed.       DYSPHAGIA PHARYNGOESOPHAGEAL PHASE    Husband has just been diagnosed with leukemia. She is struggling but feels she is coping well. [facility] are receiving [facility] at [facility] and he is awaiting a bone marrow transplant. He and their children are really struggling.       Hyperglycemia    hgba1c acceptable, minimize simple carbs. Increase exercise as tolerated.       Relevant Orders   Hemoglobin A1c (Completed)   Parkinson's disease ([place])    Doing well and following with with neurology.      Preventative health care - Primary    Other Visit Diagnoses    Encounter for immunization  Relevant Orders   Flu vaccine HIGH DOSE PF (Completed)   TSH (Completed)   CBC (Completed)   Lipid panel (Completed)   Hemoglobin A1c (Completed)   Comprehensive metabolic panel (Completed)      I am having Ms. Torregrossa maintain her Krill Oil, omeprazole, ALPRAZolam, telmisartan-hydrochlorothiazide, DULoxetine, rosuvastatin, escitalopram, carbidopa-levodopa, and  pramipexole.  No orders of the defined types were placed in this encounter.    [name], MD

## 2016-05-04 NOTE — Assessment & Plan Note (Signed)
Well controlled, no changes to meds. Encouraged heart healthy diet such as the DASH diet and exercise as tolerated.  °

## 2016-05-04 NOTE — Assessment & Plan Note (Signed)
hgba1c acceptable, minimize simple carbs. Increase exercise as tolerated.  

## 2016-05-04 NOTE — Patient Instructions (Addendum)
Can add 10 more mg of Lexapro if the stress becomes overwhelming and then let us know.   Basic Carbohydrate Counting for Diabetes Mellitus Carbohydrate counting is a method for keeping track of the amount of carbohydrates you eat. Eating carbohydrates naturally increases the level of sugar (glucose) in your blood, so it is important for you to know the amount that is okay for you to have in every meal. Carbohydrate counting helps keep the level of glucose in your blood within normal limits. The amount of carbohydrates allowed is different for every person. A dietitian can help you calculate the amount that is right for you. Once you know the amount of carbohydrates you can have, you can count the carbohydrates in the foods you want to eat. Carbohydrates are found in the following foods:  Grains, such as breads and cereals.  Dried beans and soy products.  Starchy vegetables, such as potatoes, peas, and corn.  Fruit and fruit juices.  Milk and yogurt.  Sweets and snack foods, such as cake, cookies, candy, chips, soft drinks, and fruit drinks. CARBOHYDRATE COUNTING There are two ways to count the carbohydrates in your food. You can use either of the methods or a combination of both. Reading the "Nutrition Facts" on [place] The "Nutrition Facts" is an area that is included on the labels of almost all packaged food and beverages in the [place]. It includes the serving size of that food or beverage and information about the nutrients in each serving of the food, including the grams (g) of carbohydrate per serving.  Decide the number of servings of this food or beverage that you will be able to eat or drink. Multiply that number of servings by the number of grams of carbohydrate that is listed on the label for that serving. The total will be the amount of carbohydrates you will be having when you eat or drink this food or beverage. Learning Standard Serving Sizes of Food When you eat food  that is not packaged or does not include "Nutrition Facts" on the label, you need to measure the servings in order to count the amount of carbohydrates.A serving of most carbohydrate-rich foods contains about 15 g of carbohydrates. The following list includes serving sizes of carbohydrate-rich foods that provide 15 g ofcarbohydrate per serving:   1 slice of bread (1 oz) or 1 six-inch tortilla.    of a hamburger bun or English muffin.  4-6 crackers.   cup unsweetened dry cereal.    cup hot cereal.   cup rice or pasta.    cup mashed potatoes or  of a large baked potato.  1 cup fresh fruit or one small piece of fruit.    cup canned or frozen fruit or fruit juice.  1 cup milk.   cup plain fat-free yogurt or yogurt sweetened with artificial sweeteners.   cup cooked dried beans or starchy vegetable, such as peas, corn, or potatoes.  Decide the number of standard-size servings that you will eat. Multiply that number of servings by 15 (the grams of carbohydrates in that serving). For example, if you eat 2 cups of strawberries, you will have eaten 2 servings and 30 g of carbohydrates (2 servings x 15 g = 30 g). For foods such as soups and casseroles, in which more than one food is mixed in, you will need to count the carbohydrates in each food that is included. EXAMPLE OF CARBOHYDRATE COUNTING Sample Dinner  3 oz chicken breast.  cup of brown rice.   cup of corn.  1 cup milk.   1 cup strawberries with sugar-free whipped topping.  Carbohydrate Calculation Step 1: Identify the foods that contain carbohydrates:   Rice.   Corn.   Milk.   Strawberries. Step 2:Calculate the number of servings eaten of each:   2 servings of rice.   1 serving of corn.   1 serving of milk.   1 serving of strawberries. Step 3: Multiply each of those number of servings by 15 g:   2 servings of rice x 15 g = 30 g.   1 serving of corn x 15 g = 15 g.   1 serving  of milk x 15 g = 15 g.   1 serving of strawberries x 15 g = 15 g. Step 4: Add together all of the amounts to find the total grams of carbohydrates eaten: 30 g + 15 g + 15 g + 15 g = 75 g.   This information is not intended to replace advice given to you by your health care provider. Make sure you discuss any questions you have with your health care provider.   Document Released: 08/21/2005 Document Revised: 09/11/2014 Document Reviewed: 07/18/2013 Elsevier Interactive Patient Education [employer].

## 2016-05-04 NOTE — Assessment & Plan Note (Signed)
Doing well and following with with neurology.

## 2016-05-04 NOTE — Assessment & Plan Note (Signed)
Tolerating statin, encouraged heart healthy diet, avoid trans fats, minimize simple carbs and saturated fats. Increase exercise as tolerated 

## 2016-05-04 NOTE — Progress Notes (Signed)
Pre visit review using our clinic review tool, if applicable. No additional management support is needed unless otherwise documented below in the visit note. 

## 2016-05-04 NOTE — Assessment & Plan Note (Signed)
Is struggling with her husband's recent diagnosis of leukemia. He is terribly depressed and their kid's are really struggling. He has found a bone marrow match but he is heading towards declining the transplant. She is starting therapy with [facility] this week. Continue current.

## 2016-05-15 NOTE — Assessment & Plan Note (Signed)
Encouraged DASH diet, decrease po intake and increase exercise as tolerated. Needs 7-8 hours of sleep nightly. Avoid trans fats, eat small, frequent meals every 4-5 hours with lean proteins, complex carbs and healthy fats. Minimize simple carbs 

## 2016-05-15 NOTE — Assessment & Plan Note (Addendum)
Avoid offending foods, taket probiotics. Do not eat large meals in late evening and consider raising head of bed.

## 2016-05-23 ENCOUNTER — Encounter: Payer: No Typology Code available for payment source | Admitting: Family Medicine

## 2016-05-28 ENCOUNTER — Other Ambulatory Visit: Payer: Self-pay | Admitting: Family Medicine

## 2016-05-28 DIAGNOSIS — K219 Gastro-esophageal reflux disease without esophagitis: Secondary | ICD-10-CM

## 2016-06-14 ENCOUNTER — Ambulatory Visit: Payer: Medicare Other | Admitting: Neurology

## 2016-06-15 ENCOUNTER — Telehealth: Payer: Self-pay | Admitting: Neurology

## 2016-06-15 NOTE — Telephone Encounter (Signed)
Letter faxed to number provided with confirmation received. Patient made aware [facility]).

## 2016-06-15 NOTE — Telephone Encounter (Signed)
Patient needs a letter stating it is ok for her go to the Gym please fax a letter to Karna Dupes at [phone] patient phone number is [phone]

## 2016-06-22 ENCOUNTER — Encounter: Payer: Self-pay | Admitting: Neurology

## 2016-06-26 ENCOUNTER — Ambulatory Visit: Payer: Medicare Other | Admitting: Neurology

## 2016-07-06 ENCOUNTER — Other Ambulatory Visit: Payer: Self-pay | Admitting: Family Medicine

## 2016-07-25 DIAGNOSIS — L989 Disorder of the skin and subcutaneous tissue, unspecified: Secondary | ICD-10-CM | POA: Diagnosis not present

## 2016-07-31 ENCOUNTER — Ambulatory Visit: Payer: Medicare Other | Admitting: Family Medicine

## 2016-08-13 ENCOUNTER — Other Ambulatory Visit: Payer: Self-pay | Admitting: Neurology

## 2016-09-05 ENCOUNTER — Other Ambulatory Visit: Payer: Self-pay | Admitting: Physician Assistant

## 2016-10-05 DIAGNOSIS — D1801 Hemangioma of skin and subcutaneous tissue: Secondary | ICD-10-CM | POA: Diagnosis not present

## 2016-10-05 DIAGNOSIS — L821 Other seborrheic keratosis: Secondary | ICD-10-CM | POA: Diagnosis not present

## 2016-10-05 DIAGNOSIS — Z85828 Personal history of other malignant neoplasm of skin: Secondary | ICD-10-CM | POA: Diagnosis not present

## 2016-10-05 DIAGNOSIS — D225 Melanocytic nevi of trunk: Secondary | ICD-10-CM | POA: Diagnosis not present

## 2016-10-05 DIAGNOSIS — L814 Other melanin hyperpigmentation: Secondary | ICD-10-CM | POA: Diagnosis not present

## 2016-10-05 DIAGNOSIS — Z23 Encounter for immunization: Secondary | ICD-10-CM | POA: Diagnosis not present

## 2016-10-11 DIAGNOSIS — F320 Major depressive disorder, single episode, mild: Secondary | ICD-10-CM | POA: Diagnosis not present

## 2016-10-19 ENCOUNTER — Encounter: Payer: Self-pay | Admitting: Neurology

## 2016-10-19 DIAGNOSIS — F320 Major depressive disorder, single episode, mild: Secondary | ICD-10-CM | POA: Diagnosis not present

## 2016-10-26 DIAGNOSIS — F320 Major depressive disorder, single episode, mild: Secondary | ICD-10-CM | POA: Diagnosis not present

## 2016-10-30 NOTE — Progress Notes (Signed)
Subjective:    Kristin [name] was seen in consultation in the movement disorder clinic at the request of Kristin Homans, MD.  The evaluation is for tremor.  The patient is a 66 y.o. left handed female with a history of tremor.  The records that were made available to me were reviewed.  According to records, the patient has complained about tremor to her previous primary care physician all the way back to 2012, but in those records she had said that tremor had been going on for years.  She states today, however, that she really thinks that it started in 2012.  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  R hand was always worse even though she is L hand dominant.  It has slowly progressed.  She reports that tremor is in both hands now (R still worse) but she feels tremor on the inside of the body all of the time.  She has tremor at rest now.  She feels nervous now and is unsure if it was related.  She also has the sweats and so she was placed on xanax but that doesn't help the sweating.  She recently went to an exercise class and noted that she wasn't as coordinated/strong with the R hand and that worried her and that is why she wanted a further evaluation.    She did see Dr. Krista [name] in June, 2013 and I reviewed that note.  She was diagnosed with essential tremor.  No treatment was recommended.  There is  family hx of tremor in her father.  She states that she has been on metoprolol since 2012 and didn't think that it helped but when she tried to get off of it a year ago, the tremor got worse and she couldn't get off of it.    03/09/14 update:  The patient presents today for follow up.  Today, she is accompanied by her husband who helps to supplement the history.  He was not present on the prior visit.  She was diagnosed with PD last visit.  I started her on Mirapex.  She has been doing better in terms of tremor.  Interestingly, sweats and sleep have been better.  She just  retired last week.  No side effects with the Mirapex.  She is currently taking it at 9 AM/2 PM/9 PM. No compulsive behaviors.  No sleep attacks.  She has been attending therapy.  She is planning on starting the Parkinson's exercise class.  She has been educating herself and reading patient education material.  She had a modified barium evaluation on 02/25/2014.  It was normal, although they suspected that she had mild esophageal dysphagia.  Her husband asks multiple questions today and asked about potentially having another opinion.  The patient has had no falls.  No hallucinations.  07/15/14 update:  Pt is f/u today, accompanied by her husband who supplements the history.  Pt is on mirapex 0.5 mg three times per day.  I stopped her metoprolol last visit. She did fine with that. She is exercising with the Moves class and with circuit II class.  She is also enrolled in the bike class at the [facility].  She is having some soreness because of the exercise and hip pain because of it and she has had a hip replacement and worries about that as she doesn't want to have another.  Thinking about trying chiropractics for that.  She asks me about some.  Stages that she has on the lateral aspect of the knees.  She does not wear boots.  She has not had a knee replacement.  She also complains of some achiness in her toes and it gets better as she walks.  No falls.  No lightheadedness.    11/11/14 update:  The patient returns today for follow-up.  She is on Mirapex, 0.5 mg 3 times per day.  Since our last visit, the patient did go to [facility] for a second opinion.  She saw Dr. Maxine [name].  This was just 2 days ago.  No notes are available.  They state that they had a good visit, are glad that they went and got a second opinion and no new recommendations/treatments were given.  She has been doing well at home.  She is very active in our community exercise programs for PD.  She is enrolled in the [facility] exercise program and in our circuit  class and is walking on the treadmill.  She notices tremor in public but generally not as much at home.  She notes wearing off of the medication but only when time for the next dosage.    02/09/15 update:  The patient is following up today regarding her Parkinson's disease.  She is accompanied by her husband who supplements the history.  She did get a second opinion at [facility] with Dr. Maxine [name] and his fellow, and Kristin [name], on 11/17/2014.  I reviewed those notes.  I told her that she could increase the Mirapex if needed to control tremor further.  She is currently on Mirapex 0.5 mg 3 times per day (7am/1:30 pm/7:00).  She is on Lexapro and Cymbalta for depression.  She is doing exercise.   She went to circuit class for PD today and she will have tremor when doing that.  She notes that when doing exercise, she has more tremor.  No falls.  She went to [place] for 2 weeks with a tour group and she felt that she was able to keep up with the group.  She felt better on this trip than she did 2 years ago on a trip.  05/12/15 update:  The patient falls up today regarding her Parkinson's disease. She is accompanied by her husband who supplements the history.   Last visit, I had her increase her Mirapex to 1 mg in the morning and she remained on 0.5 mg in the afternoon and 0.5 mg in the evening. She was initially tired with the increase in the medication but has adjusted now.  Her daughter and 3 month old grandson are living with them.  She is exercising on the treadmill.  She is doing the Parkinsons exercise class.  She is going to try the boxing class and is in the [employer] class and it is increasing to 2 days a week. She has been doing well with mood on a combination of Lexapro and Cymbalta.  She denies any falls.  No lightheadedness or near syncope.  No hallucinations.  08/12/15 update:  The patient follows up today, accompanied by her husband who supplements the history.  The patient is on pramipexole, 1.0 mg in the morning, 0.5  mg in the afternoon and 0.5 mg in the evening.  She is also planning on attending the [facility] retreat as a patient, which should be very good for her.  She remains on Lexapro and Cymbalta.  They're working well for her.  She is thinking about getting a therapist and asks me about  that.  She also asks me about possible supplements for Parkinson's disease.  She continues to exercise a lot.  She has not had any falls.  No dyskinesia.  No lightheadedness or near syncope.  12/01/15 update:  The patient follows up today, accompanied by her husband who supplements the history.  The patient is on pramipexole, 1.0 mg in the morning, 0.5 mg in the afternoon and 0.5 mg in the evening.  She is also planning on attending the [facility] retreat as a patient, which should be very good for her.  Its in [place].  She remains on Lexapro and Cymbalta.  They're working well for her.  She continues to exercise a lot.  Had not been pole walking and just started and noted that her arms are very sore.  Still knows that she needs a therapist and asked about it last time and gave her Valeda Malm name but she hasn't done it.  She has not had any falls.  No dyskinesia.  No lightheadedness or near syncope.  She just saw Dr. Maxine [name] on 11/08/15 and I reviewed those notes.  No changes were made.    03/14/16 update:  The patient follows up today for her PD.  The patient is on pramipexole, 1.0 mg in the morning, 0.5 mg in the afternoon and 0.5 mg in the evening.  She attended the [facility] retreat in [place] as a patient.  She went at the end of May.  States that it was very challenging.   She remains on Lexapro and Cymbalta.  They're working well for her.  She continues to exercise a lot.  She has not had any falls.  No dyskinesia.  No lightheadedness or near syncope.  Noting some turning in of the L ankle. Not sure if related to timing of medication.   States that husband was dx with MDS since our last visit.  Because of that, she has not she has not been  able to attend PD therapy locally because she has been running back and forth to [place] with him.  Sister with dx with PD since our last visit.    10/31/16 update:  Patient follows up today.  She is on pramipexole, 1 mg in the morning, 0.5 mg in the afternoon and evening.  Last visit, we initiated levodopa and she worked her way to 1 tablet 3 times per day.  She has not had side effects.  No dyskinesia.  No falls.  Is having some dizziness or near syncope.  She admits that she is not hydrating well.  She is having more choking on water/liquid/saliva.  She is  planning to attend the [facility] retreat again in [place].  She has had a fairly rough time since our last visit, as she was in [place] with several months with her husband who had been awaiting a bone marrow transplant for leukemia.  He had this 06/2016.  He still won't leave the house because he is scared of germs but he has been given the clearance to do so.  She did join a gym and was exercising while she was in [place].  Pt is going to counseling, [employer] but her husband has been resistant to do so.  Pt went to social work PD support group.  She is getting back to exercise here.  She has started back to spears biking and [facility] classes.  She feels a bit defeated about how hard the boxing class is.    Current/Previously tried tremor medications: metoprololol  Current medications that may exacerbate tremor:  Cymbalta, although I reviewed that the patient did try to change to Pristiq but the patient had nightmares with Pristiq and changed back to Cymbalta  Outside reports reviewed: historical medical records, lab reports and referral letter/letters.  Allergies  Allergen Reactions  . Bactrim [Sulfamethoxazole-Trimethoprim] Other (See Comments)    Oral ulcers and rash  . Penicillins Hives    Current Outpatient Prescriptions on File Prior to Visit  Medication Sig Dispense Refill  . ALPRAZolam (XANAX) 0.5 MG tablet Take 1 tablet (0.5 mg total) by  mouth as needed for anxiety or sleep. 30 tablet 1  . carbidopa-levodopa (SINEMET IR) 25-100 MG tablet take 1 tablet by mouth three times a day 90 tablet 2  . DULoxetine (CYMBALTA) 30 MG capsule take 1 capsule by mouth once daily 90 capsule 1  . escitalopram (LEXAPRO) 20 MG tablet take 1 tablet by mouth once daily 90 tablet 0  . Krill Oil CAPS 1 krill oil caps daily, MegaRed caps by Schiff    . omeprazole (PRILOSEC) 20 MG capsule take 1 capsule by mouth once daily 90 capsule 1  . pramipexole (MIRAPEX) 0.5 MG tablet TAKE 2 TABLETS BY MOUTH IN THE MORNING 1 TABLET IN THE AFTERNOON AND 1 TABLET IN THE EVENING 360 tablet 3  . rosuvastatin (CRESTOR) 10 MG tablet TAKE 1 TABLET BY MOUTH ON MONDAYS, WEDNESDAY, AND FRIDAYS 90 tablet 2  . telmisartan-hydrochlorothiazide (MICARDIS HCT) 80-12.5 MG tablet TAKE 1 TABLET BY MOUTH DAILY 90 tablet 1   No current facility-administered medications on file prior to visit.     Past Medical History:  Diagnosis Date  . Abnormal vaginal Pap smear   . Acute pharyngitis 02/25/2013  . Anemia   . Anxiety   . Arthritis   . 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 Chicken pox as a child  . Chronic UTI    sees dr Terance Hart  . Constipation 12/07/2015  . Depression with anxiety 11/02/2009   Qualifier: Diagnosis of  By: [name], LPN, [name]   . Dermatitis 07/17/2012  . Esophageal stricture 1994  . Fibroids   . Foot pain, bilateral 06/19/2012  . GERD (gastroesophageal reflux disease)   . Hiatal hernia   . Hyperglycemia 08/19/2013  . 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 Hyperlipidemia   . Hypertension   . Measles as a child  . Obesity   . Plantar fasciitis of left foot 06/19/2012  . Preventative health care 12/19/2015  . Rosacea 10/05/2014  . Urinary frequency 02/25/2013  . Visual floaters 05/11/2014    Past Surgical History:  Procedure Laterality Date  . ABDOMINAL HYSTERECTOMY  2006   total  . esophageal     stretching  . laporoscopy    .  PARTIAL HIP ARTHROPLASTY  2010   right  . TONSILLECTOMY    . wisdom teeth extracted      Social History   Social History  . Marital status: Married    Spouse name: N/A  . Number of children: N/A  . Years of education: N/A   Occupational History  . Not on file.   Social History Main Topics  . Smoking status: Never Smoker  . Smokeless tobacco: Never Used  . Alcohol use 2.4 oz/week    4 Glasses of wine per week     Comment: 4 glasses of wine weekly  . Drug use: No  . Sexual activity: Yes    Partners: Male  Other Topics Concern  . Not on file   Social History Narrative   Lives with husband, no major dietary restrictions, retired from teaching    Family Status  Relation Status  . Mother Deceased at age 1   Dementia, breast cancer, bipolar  . Father Deceased at age 96   heart failure, rheumatoid arthritis  . Sister Alive   arthritis, PD  . Sister Alive  . Daughter Alive   56  . Son Alive   47  . Maternal Grandmother Deceased  . Maternal Grandfather Deceased  . Paternal Grandmother Deceased  . Paternal Grandfather Deceased  . Sister Alive    healthy  . Sister Alive   healthy  . Daughter Alive   28/ healthy    Review of Systems A complete 10 system ROS was obtained and was negative apart from what is mentioned.   Objective:   VITALS:   Vitals:   10/31/16 1447  BP: 112/62  Pulse: 86  Weight: 253 lb (114.8 kg)  Height: 5\' 4"  (1.626 m)   Wt Readings from Last 3 Encounters:  10/31/16 253 lb (114.8 kg)  05/04/16 236 lb (107 kg)  03/14/16 231 lb (104.8 kg)     Gen:  Appears stated age and in NAD.  She is diaphoretic (room is also hot) HEENT:  Normocephalic, atraumatic. The mucous membranes are moist.  Cardiovascular: Regular rate and rhythm Lungs: Clear to auscultation bilaterally Neck: No carotid bruits  NEUROLOGICAL:  Orientation: The patient is alert and oriented x3. Fund of knowledge is appropriate.  Recent and remote memory are intact.   Attention and concentration are normal.    Able to name objects and repeat phrases. Cranial nerves: There is good facial symmetry. The visual fields are full to confrontational testing. The speech is fluent and clear. Soft palate rises symmetrically and there is no tongue deviation. Hearing is intact to conversational tone. Sensation: Sensation is intact to light touch throughout. Motor: Strength is 5/5 in the bilateral upper and lower extremities.   Shoulder shrug is equal and symmetric.  There is no pronator drift.   Movement examination: Tone: There is normal tone in the upper and lower extremity. Abnormal movements: There is a mild RUE tremor. There is an intermittent RLE tremor Coordination:  There is no decremation with any form of RAMS, including alternating supination and pronation of the forearm, hand opening and closing, finger taps, heel taps and toe taps bilaterally. Gait and Station: The patient has no significant difficulty arising out of a deep-seated chair without the use of the hands. The patient's stride length is fairly normal, and there is improved arm swing compared to the past.  The patient has a negative pull test.      LABS  Lab Results  Component Value Date   TSH 1.13 05/04/2016   Lab Results  Component Value Date   WBC 5.5 05/04/2016   HGB 12.3 05/04/2016   HCT 37.1 05/04/2016   MCV 83.9 05/04/2016   PLT 226.0 05/04/2016     Chemistry      Component Value Date/Time   NA 140 05/04/2016 0935   K 4.0 05/04/2016 0935   CL 105 05/04/2016 0935   CO2 29 05/04/2016 0935   BUN 15 05/04/2016 0935   CREATININE 0.76 05/04/2016 0935   CREATININE 0.71 01/04/2015 1046      Component Value Date/Time   CALCIUM 9.1 05/04/2016 0935   ALKPHOS 68 05/04/2016 0935   AST 19 05/04/2016 0935  ALT 5 05/04/2016 0935   BILITOT 0.6 05/04/2016 0935          Assessment/Plan:   1. idiopathic Parkinson's disease.  The patient has tremor, bradykinesia, rigidity and mild  postural instability.  -continue pramipexole 1 mg in the morning and continue the 0.5 mg in the afternoon and evening.  She has had no compulsive side effects.  Risks, benefits, side effects and alternative therapies were discussed.  The opportunity to ask questions was given and they were answered to the best of my ability.  The patient expressed understanding and willingness to follow the outlined treatment protocols.  -Continue carbidopa/levodopa 25/100 one tablet tid.  Risks, benefits, side effects and alternative therapies were discussed.  The opportunity to ask questions was given and they were answered to the best of my ability.  The patient expressed understanding and willingness to follow the outlined treatment protocols.  -talked about water intake and needs to greatly increase water hydration 2.  Depression and anxiety  -This certainly could be associated with Parkinson's disease, or could be independent of it.  She is on a combination of Lexapro and Cymbalta and she is doing well.    -She is now seeing a Social worker.  I think this is incredibly important.  I would love to see her today and the family counseling and have her husband see a counselor as well. 3.  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 4.  Dysphagia  -last MBE 12/2015.  Liquids becoming slightly more problematic.  Talked about chin tuck maneuver.  Use straw.   5.  Follow-up in the next 3 months, sooner should new neurologic issues arise.  Much greater than 50% of this visit was spent in counseling and coordinating care.  Total face to face time:  45 min discussing importance of taking time for self care and safety in PD

## 2016-10-31 ENCOUNTER — Encounter: Payer: Self-pay | Admitting: Neurology

## 2016-10-31 ENCOUNTER — Ambulatory Visit (INDEPENDENT_AMBULATORY_CARE_PROVIDER_SITE_OTHER): Payer: Medicare Other | Admitting: Neurology

## 2016-10-31 VITALS — BP 112/62 | HR 86 | Ht 64.0 in | Wt 253.0 lb

## 2016-10-31 DIAGNOSIS — F418 Other specified anxiety disorders: Secondary | ICD-10-CM | POA: Diagnosis not present

## 2016-10-31 DIAGNOSIS — G20 Parkinson's disease: Secondary | ICD-10-CM

## 2016-10-31 DIAGNOSIS — R1319 Other dysphagia: Secondary | ICD-10-CM | POA: Diagnosis not present

## 2016-11-03 ENCOUNTER — Encounter: Payer: Self-pay | Admitting: Family Medicine

## 2016-11-03 ENCOUNTER — Ambulatory Visit (INDEPENDENT_AMBULATORY_CARE_PROVIDER_SITE_OTHER): Payer: Medicare Other | Admitting: Family Medicine

## 2016-11-03 DIAGNOSIS — G20 Parkinson's disease: Secondary | ICD-10-CM | POA: Diagnosis not present

## 2016-11-03 DIAGNOSIS — Z0000 Encounter for general adult medical examination without abnormal findings: Secondary | ICD-10-CM

## 2016-11-03 DIAGNOSIS — E785 Hyperlipidemia, unspecified: Secondary | ICD-10-CM | POA: Diagnosis not present

## 2016-11-03 DIAGNOSIS — E669 Obesity, unspecified: Secondary | ICD-10-CM | POA: Diagnosis not present

## 2016-11-03 DIAGNOSIS — R739 Hyperglycemia, unspecified: Secondary | ICD-10-CM | POA: Diagnosis not present

## 2016-11-03 DIAGNOSIS — R1314 Dysphagia, pharyngoesophageal phase: Secondary | ICD-10-CM | POA: Diagnosis not present

## 2016-11-03 DIAGNOSIS — M1711 Unilateral primary osteoarthritis, right knee: Secondary | ICD-10-CM

## 2016-11-03 DIAGNOSIS — I10 Essential (primary) hypertension: Secondary | ICD-10-CM | POA: Diagnosis not present

## 2016-11-03 DIAGNOSIS — F418 Other specified anxiety disorders: Secondary | ICD-10-CM | POA: Diagnosis not present

## 2016-11-03 LAB — COMPREHENSIVE METABOLIC PANEL
ALT: 6 U/L (ref 0–35)
AST: 21 U/L (ref 0–37)
Albumin: 4.1 g/dL (ref 3.5–5.2)
Alkaline Phosphatase: 64 U/L (ref 39–117)
BUN: 14 mg/dL (ref 6–23)
CO2: 29 mEq/L (ref 19–32)
Calcium: 9.3 mg/dL (ref 8.4–10.5)
Chloride: 104 mEq/L (ref 96–112)
Creatinine, Ser: 0.77 mg/dL (ref 0.40–1.20)
GFR: 79.6 mL/min (ref >60.00)
Glucose, Bld: 97 mg/dL (ref 70–99)
Potassium: 4.2 mEq/L (ref 3.5–5.1)
Sodium: 141 mEq/L (ref 135–145)
Total Bilirubin: 0.6 mg/dL (ref 0.2–1.2)
Total Protein: 6.9 g/dL (ref 6.0–8.3)

## 2016-11-03 LAB — LIPID PANEL
Cholesterol: 141 mg/dL (ref 0–200)
HDL: 48 mg/dL (ref >39.00)
LDL Cholesterol: 72 mg/dL (ref 0–99)
NonHDL: 92.79
Total CHOL/HDL Ratio: 3
Triglycerides: 103 mg/dL (ref 0.0–149.0)
VLDL: 20.6 mg/dL (ref 0.0–40.0)

## 2016-11-03 LAB — CBC
HCT: 37 % (ref 36.0–46.0)
Hemoglobin: 11.9 g/dL — ABNORMAL LOW (ref 12.0–15.0)
MCHC: 32.1 g/dL (ref 30.0–36.0)
MCV: 85.9 fl (ref 78.0–100.0)
Platelets: 243 10*3/uL (ref 150.0–400.0)
RBC: 4.31 Mil/uL (ref 3.87–5.11)
RDW: 15.1 % (ref 11.5–15.5)
WBC: 6.1 10*3/uL (ref 4.0–10.5)

## 2016-11-03 LAB — HEMOGLOBIN A1C: Hgb A1c MFr Bld: 6.2 % (ref 4.6–6.5)

## 2016-11-03 LAB — TSH: TSH: 0.87 u[IU]/mL (ref 0.35–4.50)

## 2016-11-03 NOTE — Assessment & Plan Note (Signed)
Encouraged DASH diet, decrease po intake and increase exercise as tolerated. Needs 7-8 hours of sleep nightly. Avoid trans fats, eat small, frequent meals every 4-5 hours with lean proteins, complex carbs and healthy fats. Minimize simple carbs, referred to bariatric program 

## 2016-11-03 NOTE — Assessment & Plan Note (Signed)
Encouraged heart healthy diet, increase exercise, avoid trans fats, consider a krill oil cap daily 

## 2016-11-03 NOTE — Assessment & Plan Note (Signed)
Continues to struggle with liquids occasionally. Has not choked on meat but has felt like she might so she has cut down on the red meat and that has been helpful.

## 2016-11-03 NOTE — Assessment & Plan Note (Addendum)
Continues to follow with neurology. Is participating in a boxing program for Parkinson's disease at a gym and she is enjoying that.

## 2016-11-03 NOTE — Progress Notes (Signed)
Subjective:  I acted as a Education administrator for Dr. Charlett Blake. [place], [place]   Patient ID: Kristin [name], female    DOB: August 13, 1951, 66 y.o.   MRN: [id number]  Chief Complaint  Patient presents with  . Annual Exam  . Hypertension  . Hyperlipidemia    Hypertension  This is a chronic problem. The problem is controlled. Associated symptoms include malaise/fatigue. Pertinent negatives include no blurred vision, chest pain, headaches, palpitations or shortness of breath. Compliance problems include exercise.   Hyperlipidemia  This is a chronic problem. Pertinent negatives include no chest pain or shortness of breath.    Patient is in today for an annual exam following up on hypertension, hyperlipidemia and other medical concerns. Patient has complains of both feet tingling.   She continues to follow with neurology for the parkinson's and is doing well.  She is noting an new symptom of pain on the tops of her feet she believes is arthritis, no injury or tingling. She is noting anhedonia but has started with a new counselor due to the stressors of her disease and her husband's worsening health. They spent several months at [facility] for his treatments and they have just returned home. She is frustrated with how much weight she gained during that time and has cleaned up her diet and started exercising since she returned home. No recent febrile illness or hospitalizations. Denies CP/palp/SOB/HA/congestion/fevers/GI or GU c/o. Taking meds as prescribed. Patient Care Team: Mosie Lukes, MD as PCP - General (Family Medicine) Ludwig Clarks, DO as Consulting Physician (Neurology)   Past Medical History:  Diagnosis Date  . Abnormal vaginal Pap smear   . Acute pharyngitis 02/25/2013  . Anemia   . Anxiety   . Arthritis   . 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 Chicken pox as a child  . Chronic UTI    sees dr Terance Hart  . Constipation 12/07/2015  . Depression with anxiety 11/02/2009   Qualifier:  Diagnosis of  By: [name], LPN, [name]   . Dermatitis 07/17/2012  . Esophageal stricture 1994  . Fibroids   . Foot pain, bilateral 06/19/2012  . GERD (gastroesophageal reflux disease)   . Hiatal hernia   . Hyperglycemia 08/19/2013  . 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 Hyperlipidemia   . Hypertension   . Measles as a child  . Obesity   . Plantar fasciitis of left foot 06/19/2012  . Preventative health care 12/19/2015  . Rosacea 10/05/2014  . Urinary frequency 02/25/2013  . Visual floaters 05/11/2014    Past Surgical History:  Procedure Laterality Date  . ABDOMINAL HYSTERECTOMY  2006   total  . esophageal     stretching  . laporoscopy    . PARTIAL HIP ARTHROPLASTY  2010   right  . TONSILLECTOMY    . wisdom teeth extracted      Family History  Problem Relation Age of Onset  . Cancer Mother     breast  . Other Mother     arrythmia  . Mental illness Mother     bipolar  . Heart disease Father   . Arthritis Father     rheumatoid  . Depression Sister   . Mental illness Sister     bipolar  . Parkinson's disease Sister   . Arthritis Sister   . Colon cancer Neg Hx   . Esophageal cancer Neg Hx   . Rectal cancer Neg Hx   . Stomach cancer Neg Hx  Social History   Social History  . Marital status: Married    Spouse name: N/A  . Number of children: N/A  . Years of education: N/A   Occupational History  . Not on file.   Social History Main Topics  . Smoking status: Never Smoker  . Smokeless tobacco: Never Used  . Alcohol use 2.4 oz/week    4 Glasses of wine per week     Comment: 4 glasses of wine weekly  . Drug use: No  . Sexual activity: Yes    Partners: Male   Other Topics Concern  . Not on file   Social History Narrative   Lives with husband, no major dietary restrictions, retired from teaching      Husband dx with cancer MDS June 2017.   Currently in remission. (11/03/16 pc)          Outpatient Medications Prior to Visit  Medication Sig  Dispense Refill  . ALPRAZolam (XANAX) 0.5 MG tablet Take 1 tablet (0.5 mg total) by mouth as needed for anxiety or sleep. 30 tablet 1  . carbidopa-levodopa (SINEMET IR) 25-100 MG tablet take 1 tablet by mouth three times a day 90 tablet 2  . DULoxetine (CYMBALTA) 30 MG capsule take 1 capsule by mouth once daily 90 capsule 1  . escitalopram (LEXAPRO) 20 MG tablet take 1 tablet by mouth once daily 90 tablet 0  . Krill Oil CAPS 1 krill oil caps daily, MegaRed caps by Schiff    . omeprazole (PRILOSEC) 20 MG capsule take 1 capsule by mouth once daily 90 capsule 1  . pramipexole (MIRAPEX) 0.5 MG tablet TAKE 2 TABLETS BY MOUTH IN THE MORNING 1 TABLET IN THE AFTERNOON AND 1 TABLET IN THE EVENING 360 tablet 3  . rosuvastatin (CRESTOR) 10 MG tablet TAKE 1 TABLET BY MOUTH ON MONDAYS, WEDNESDAY, AND FRIDAYS 90 tablet 2  . telmisartan-hydrochlorothiazide (MICARDIS HCT) 80-12.5 MG tablet TAKE 1 TABLET BY MOUTH DAILY 90 tablet 1   No facility-administered medications prior to visit.     Allergies  Allergen Reactions  . Bactrim [Sulfamethoxazole-Trimethoprim] Other (See Comments)    Oral ulcers and rash  . Penicillins Hives    Review of Systems  Constitutional: Positive for malaise/fatigue. Negative for fever.  HENT: Negative for congestion, ear discharge and ear pain.   Eyes: Negative for blurred vision.  Respiratory: Negative for cough and shortness of breath.   Cardiovascular: Negative for chest pain, palpitations and leg swelling.  Gastrointestinal: Negative for vomiting.  Genitourinary: Negative for frequency.  Musculoskeletal: Positive for joint pain. Negative for back pain.  Skin: Negative for rash.  Neurological: Positive for tremors. Negative for loss of consciousness and headaches.  Psychiatric/Behavioral: Positive for depression. The patient is nervous/anxious.        Objective:    Physical Exam  Constitutional: She is oriented to person, place, and time. She appears well-developed  and well-nourished. No distress.  HENT:  Head: Normocephalic and atraumatic.  Eyes: Conjunctivae are normal.  Neck: Normal range of motion. No thyromegaly present.  Cardiovascular: Normal rate and regular rhythm.   Pulmonary/Chest: Effort normal and breath sounds normal. She has no wheezes.  Abdominal: Soft. Bowel sounds are normal. There is no tenderness.  Musculoskeletal: Normal range of motion. She exhibits no deformity.  Neurological: She is alert and oriented to person, place, and time.  tremor  Skin: Skin is warm and dry. She is not diaphoretic.  Psychiatric: She has a normal mood and affect.  There were no vitals taken for this visit. Wt Readings from Last 3 Encounters:  10/31/16 253 lb (114.8 kg)  05/04/16 236 lb (107 kg)  03/14/16 231 lb (104.8 kg)     Lab Results  Component Value Date   WBC 5.5 05/04/2016   HGB 12.3 05/04/2016   HCT 37.1 05/04/2016   PLT 226.0 05/04/2016   GLUCOSE 94 05/04/2016   CHOL 179 05/04/2016   TRIG 131.0 05/04/2016   HDL 59.10 05/04/2016   LDLDIRECT 161.5 10/07/2008   LDLCALC 94 05/04/2016   ALT 5 05/04/2016   AST 19 05/04/2016   NA 140 05/04/2016   K 4.0 05/04/2016   CL 105 05/04/2016   CREATININE 0.76 05/04/2016   BUN 15 05/04/2016   CO2 29 05/04/2016   TSH 1.13 05/04/2016   INR 2.5 (H) 01/19/2009   HGBA1C 6.0 05/04/2016    Lab Results  Component Value Date   TSH 1.13 05/04/2016   Lab Results  Component Value Date   WBC 5.5 05/04/2016   HGB 12.3 05/04/2016   HCT 37.1 05/04/2016   MCV 83.9 05/04/2016   PLT 226.0 05/04/2016   Lab Results  Component Value Date   NA 140 05/04/2016   K 4.0 05/04/2016   CO2 29 05/04/2016   GLUCOSE 94 05/04/2016   BUN 15 05/04/2016   CREATININE 0.76 05/04/2016   BILITOT 0.6 05/04/2016   ALKPHOS 68 05/04/2016   AST 19 05/04/2016   ALT 5 05/04/2016   PROT 6.9 05/04/2016   ALBUMIN 4.1 05/04/2016   CALCIUM 9.1 05/04/2016   GFR 80.93 05/04/2016   Lab Results  Component Value Date    CHOL 179 05/04/2016   Lab Results  Component Value Date   HDL 59.10 05/04/2016   Lab Results  Component Value Date   LDLCALC 94 05/04/2016   Lab Results  Component Value Date   TRIG 131.0 05/04/2016   Lab Results  Component Value Date   CHOLHDL 3 05/04/2016   Lab Results  Component Value Date   HGBA1C 6.0 05/04/2016       Assessment & Plan:   Problem List Items Addressed This Visit    Hyperlipidemia    Encouraged heart healthy diet, increase exercise, avoid trans fats, consider a krill oil cap daily      Relevant Orders   Lipid panel   Lipid panel   Obesity    Encouraged DASH diet, decrease po intake and increase exercise as tolerated. Needs 7-8 hours of sleep nightly. Avoid trans fats, eat small, frequent meals every 4-5 hours with lean proteins, complex carbs and healthy fats. Minimize simple carbs, referred to bariatric program      Relevant Orders   TSH   TSH   Depression with anxiety    Has been struggling with stress due to her own illness and her husband's illness. She has started with a therapist. Her husband does not communicate regularly. She hopes the therapy will be helpful. She has been going out more since they got home from [place].       Essential hypertension   Relevant Orders   CBC   TSH   Comprehensive metabolic panel   CBC   Comprehensive metabolic panel   TSH   Osteoarthritis    Has been at [place] with her husband for several months for his treatments and now that they are home she has tried to get back to exercise and the pain of this increased movement has been notable but is going  to persistent. Tops of her feet are hurting encouraged better shoes, inserts and topical lidocaine      DYSPHAGIA PHARYNGOESOPHAGEAL PHASE    Continues to struggle with liquids occasionally. Has not choked on meat but has felt like she might so she has cut down on the red meat and that has been helpful.       Hyperglycemia     minimize simple carbs.  Increase exercise as tolerated.       Relevant Orders   Hemoglobin A1c   Hemoglobin A1c   Parkinson's disease ([place])    Continues to follow with neurology. Is participating in a boxing program for Parkinson's disease at a gym and she is enjoying that.       Preventative health care - Primary    Patient encouraged to maintain heart healthy diet, regular exercise, adequate sleep. Consider daily probiotics. Take medications as prescribed. She brought in advanced directive paper work today, sent to have scanned in the chart.       Relevant Orders   Hemoglobin A1c   CBC   Lipid panel   TSH   Comprehensive metabolic panel      I am having Ms. Mersman maintain her Krill Oil, ALPRAZolam, rosuvastatin, pramipexole, omeprazole, telmisartan-hydrochlorothiazide, DULoxetine, carbidopa-levodopa, and escitalopram.  No orders of the defined types were placed in this encounter.   CMA served as Education administrator during this visit. History, Physical and Plan performed by medical provider. Documentation and orders reviewed and attested to.  [name], MD

## 2016-11-03 NOTE — Assessment & Plan Note (Signed)
minimize simple carbs. Increase exercise as tolerated.  

## 2016-11-03 NOTE — Assessment & Plan Note (Signed)
Patient encouraged to maintain heart healthy diet, regular exercise, adequate sleep. Consider daily probiotics. Take medications as prescribed. She brought in advanced directive paper work today, sent to have scanned in the chart.

## 2016-11-03 NOTE — Assessment & Plan Note (Addendum)
Has been at [place] with her husband for several months for his treatments and now that they are home she has tried to get back to exercise and the pain of this increased movement has been notable but is going to persistent. Tops of her feet are hurting encouraged better shoes, inserts and topical lidocaine

## 2016-11-03 NOTE — Progress Notes (Signed)
Pre visit review using our clinic review tool, if applicable. No additional management support is needed unless otherwise documented below in the visit note. 

## 2016-11-03 NOTE — Patient Instructions (Signed)
Preventive Care 67 Years and Older, Female Preventive care refers to lifestyle choices and visits with your health care provider that can promote health and wellness. What does preventive care include?  A yearly physical exam. This is also called an annual well check.  Dental exams once or twice a year.  Routine eye exams. Ask your health care provider how often you should have your eyes checked.  Personal lifestyle choices, including:  Daily care of your teeth and gums.  Regular physical activity.  Eating a healthy diet.  Avoiding tobacco and drug use.  Limiting alcohol use.  Practicing safe sex.  Taking low-dose aspirin every day.  Taking vitamin and mineral supplements as recommended by your health care provider. What happens during an annual well check? The services and screenings done by your health care provider during your annual well check will depend on your age, overall health, lifestyle risk factors, and family history of disease. Counseling  Your health care provider may ask you questions about your:  Alcohol use.  Tobacco use.  Drug use.  Emotional well-being.  Home and relationship well-being.  Sexual activity.  Eating habits.  History of falls.  Memory and ability to understand (cognition).  Work and work environment.  Reproductive health. Screening  You may have the following tests or measurements:  Height, weight, and BMI.  Blood pressure.  Lipid and cholesterol levels. These may be checked every 5 years, or more frequently if you are over 50 years old.  Skin check.  Lung cancer screening. You may have this screening every year starting at age 55 if you have a 30-pack-year history of smoking and currently smoke or have quit within the past 15 years.  Fecal occult blood test (FOBT) of the stool. You may have this test every year starting at age 50.  Flexible sigmoidoscopy or colonoscopy. You may have a sigmoidoscopy every 5 years or  a colonoscopy every 10 years starting at age 50.  Hepatitis C blood test.  Hepatitis B blood test.  Sexually transmitted disease (STD) testing.  Diabetes screening. This is done by checking your blood sugar (glucose) after you have not eaten for a while (fasting). You may have this done every 1-3 years.  Bone density scan. This is done to screen for osteoporosis. You may have this done starting at age 67.  Mammogram. This may be done every 1-2 years. Talk to your health care provider about how often you should have regular mammograms. Talk with your health care provider about your test results, treatment options, and if necessary, the need for more tests. Vaccines  Your health care provider may recommend certain vaccines, such as:  Influenza vaccine. This is recommended every year.  Tetanus, diphtheria, and acellular pertussis (Tdap, Td) vaccine. You may need a Td booster every 10 years.  Varicella vaccine. You may need this if you have not been vaccinated.  Zoster vaccine. You may need this after age 60.  Measles, mumps, and rubella (MMR) vaccine. You may need at least one dose of MMR if you were born in 1957 or later. You may also need a second dose.  Pneumococcal 13-valent conjugate (PCV13) vaccine. One dose is recommended after age 67.  Pneumococcal polysaccharide (PPSV23) vaccine. One dose is recommended after age 67.  Meningococcal vaccine. You may need this if you have certain conditions.  Hepatitis A vaccine. You may need this if you have certain conditions or if you travel or work in places where you may be exposed to   hepatitis A.  Hepatitis B vaccine. You may need this if you have certain conditions or if you travel or work in places where you may be exposed to hepatitis B.  Haemophilus influenzae type b (Hib) vaccine. You may need this if you have certain conditions. Talk to your health care provider about which screenings and vaccines you need and how often you need  them. This information is not intended to replace advice given to you by your health care provider. Make sure you discuss any questions you have with your health care provider. Document Released: 09/17/2015 Document Revised: 05/10/2016 Document Reviewed: 06/22/2015 Elsevier Interactive Patient Education  2017 [employer]  

## 2016-11-03 NOTE — Assessment & Plan Note (Addendum)
Has been struggling with stress due to her own illness and her husband's illness. She has started with a therapist. Her husband does not communicate regularly. She hopes the therapy will be helpful. She has been going out more since they got home from [place].

## 2016-11-10 DIAGNOSIS — F320 Major depressive disorder, single episode, mild: Secondary | ICD-10-CM | POA: Diagnosis not present

## 2016-11-12 ENCOUNTER — Other Ambulatory Visit: Payer: Self-pay | Admitting: Neurology

## 2016-11-16 DIAGNOSIS — F320 Major depressive disorder, single episode, mild: Secondary | ICD-10-CM | POA: Diagnosis not present

## 2016-11-19 ENCOUNTER — Other Ambulatory Visit: Payer: Self-pay | Admitting: Family Medicine

## 2016-11-19 ENCOUNTER — Other Ambulatory Visit: Payer: Self-pay | Admitting: Neurology

## 2016-11-19 DIAGNOSIS — K219 Gastro-esophageal reflux disease without esophagitis: Secondary | ICD-10-CM

## 2016-11-23 ENCOUNTER — Encounter (INDEPENDENT_AMBULATORY_CARE_PROVIDER_SITE_OTHER): Payer: Self-pay | Admitting: Family Medicine

## 2016-11-27 ENCOUNTER — Ambulatory Visit (INDEPENDENT_AMBULATORY_CARE_PROVIDER_SITE_OTHER): Payer: Medicare Other | Admitting: Family Medicine

## 2016-11-27 ENCOUNTER — Encounter (INDEPENDENT_AMBULATORY_CARE_PROVIDER_SITE_OTHER): Payer: Self-pay | Admitting: Family Medicine

## 2016-11-27 VITALS — BP 111/74 | HR 81 | Temp 97.6°F | Ht 64.0 in | Wt 244.0 lb

## 2016-11-27 DIAGNOSIS — Z1331 Encounter for screening for depression: Secondary | ICD-10-CM

## 2016-11-27 DIAGNOSIS — R5383 Other fatigue: Secondary | ICD-10-CM

## 2016-11-27 DIAGNOSIS — G20 Parkinson's disease: Secondary | ICD-10-CM | POA: Diagnosis not present

## 2016-11-27 DIAGNOSIS — I10 Essential (primary) hypertension: Secondary | ICD-10-CM

## 2016-11-27 DIAGNOSIS — E784 Other hyperlipidemia: Secondary | ICD-10-CM

## 2016-11-27 DIAGNOSIS — F320 Major depressive disorder, single episode, mild: Secondary | ICD-10-CM | POA: Diagnosis not present

## 2016-11-27 DIAGNOSIS — R9431 Abnormal electrocardiogram [ECG] [EKG]: Secondary | ICD-10-CM | POA: Diagnosis not present

## 2016-11-27 DIAGNOSIS — Z9189 Other specified personal risk factors, not elsewhere classified: Secondary | ICD-10-CM | POA: Diagnosis not present

## 2016-11-27 DIAGNOSIS — Z6841 Body Mass Index (BMI) 40.0 and over, adult: Secondary | ICD-10-CM | POA: Diagnosis not present

## 2016-11-27 DIAGNOSIS — G20A1 Parkinson's disease without dyskinesia, without mention of fluctuations: Secondary | ICD-10-CM | POA: Insufficient documentation

## 2016-11-27 DIAGNOSIS — R0602 Shortness of breath: Secondary | ICD-10-CM | POA: Insufficient documentation

## 2016-11-27 DIAGNOSIS — Z0289 Encounter for other administrative examinations: Secondary | ICD-10-CM

## 2016-11-27 DIAGNOSIS — Z1389 Encounter for screening for other disorder: Secondary | ICD-10-CM | POA: Diagnosis not present

## 2016-11-27 DIAGNOSIS — E7849 Other hyperlipidemia: Secondary | ICD-10-CM

## 2016-11-27 NOTE — Progress Notes (Signed)
Office: [phone]  /  Fax: [phone]   HPI:   Chief Complaint: Kristin [name] (MR# [id number]) is a 66 y.o. female who presents on 11/27/2016 for obesity evaluation and treatment. Current BMI is Body mass index is 41.88 kg/m.Kristin [name] has struggled with obesity for years and has been unsuccessful in either losing weight or maintaining long term weight loss. Kristin [name] attended our information session and states she is currently in the action stage of change and ready to dedicate time achieving and maintaining a healthier weight.  Kristin [name] states her family eats meals together she thinks her family will eat healthier with  her she struggles with family and or coworkers weight loss sabotage her desired weight loss is 70 lbs she has been heavy most of  her life she started gaining weight 1978 her heaviest weight ever was 250 lbs. she has significant food cravings issues  she snacks frequently in the evenings she frequently makes poor food choices she has binge eating behaviors she struggles with emotional eating    Fatigue Kristin [name] feels her energy is lower than it should be. This has worsened with weight gain and has not worsened recently. Kristin [name] admits to daytime somnolence and  denies waking up still tired. Patient is at risk for obstructive sleep apnea. Patent has a history of symptoms of daytime fatigue and morning headache. Patient generally gets 8 hours of sleep per night, and states they generally have generally restful sleep. Snoring is present. Apneic episodes are not present. Epworth Sleepiness Score is 4  Dyspnea on exertion Kristin [name] notes increasing shortness of breath with exercising and seems to be worsening over time with weight gain. She notes getting out of breath sooner with activity than she used to. This has not gotten worse recently. Kristin [name] denies orthopnea.  Hypertension Kristin [name] is a 66 y.o. female with hypertension. She is currently on Telmisartan-HCTZ. Kristin [name]  denies chest pain. She is working weight loss to help control her blood pressure with the goal of decreasing her risk of heart attack and stroke. Kristin [name] blood pressure is currently controlled.  Hyperlipidemia Kristin [name] has hyperlipidemia and has been trying to improve her cholesterol levels with intensive lifestyle modification including a low saturated fat diet, exercise and weight loss. She denies any chest pain, claudication or myalgias.  Abnormal EKG Kristin [name] had incorrect read due to her resting tremor.  At risk for cardiovascular disease Kristin [name] is at a higher than average risk for cardiovascular disease due to obesity. She currently denies any chest pain.  Parkinson's  Kristin [name] has diagnosis of Parkinson's with early dysphagia. She feels like she can choke if food is too dry.  Depression Screen Kristin [name] Food and Mood (modified PHQ-9) score was  Depression screen PHQ 2/9 11/27/2016  Decreased Interest 1  Down, Depressed, Hopeless 3  PHQ - 2 Score 4  Altered sleeping 0  Tired, decreased energy 2  Change in appetite 3  Feeling bad or failure about yourself  3  Trouble concentrating 1  Moving slowly or fidgety/restless 0  Suicidal thoughts 0  PHQ-9 Score 13    ALLERGIES: Allergies  Allergen Reactions  . Bactrim [Sulfamethoxazole-Trimethoprim] Other (See Comments)    Oral ulcers and rash  . Penicillins Hives    MEDICATIONS: Current Outpatient Prescriptions on File Prior to Visit  Medication Sig Dispense Refill  . ALPRAZolam (XANAX) 0.5 MG tablet Take 1 tablet (0.5 mg total) by mouth as needed for anxiety or sleep. 30 tablet 1  . carbidopa-levodopa (  SINEMET IR) 25-100 MG tablet take 1 tablet by mouth three times a day 90 tablet 5  . carbidopa-levodopa (SINEMET IR) 25-100 MG tablet take 1 tablet by mouth three times a day 90 tablet 5  . DULoxetine (CYMBALTA) 30 MG capsule take 1 capsule by mouth once daily 90 capsule 1  . escitalopram (LEXAPRO) 20 MG tablet take 1 tablet by mouth once daily  90 tablet 0  . Krill Oil CAPS 1 krill oil caps daily, MegaRed caps by Schiff    . omeprazole (PRILOSEC) 20 MG capsule take 1 capsule by mouth once daily 90 capsule 1  . pramipexole (MIRAPEX) 0.5 MG tablet TAKE 2 TABLETS BY MOUTH IN THE MORNING 1 TABLET IN THE AFTERNOON AND 1 TABLET IN THE EVENING 360 tablet 3  . rosuvastatin (CRESTOR) 10 MG tablet TAKE 1 TABLET BY MOUTH ON MONDAYS, WEDNESDAY, AND FRIDAYS 90 tablet 2  . telmisartan-hydrochlorothiazide (MICARDIS HCT) 80-12.5 MG tablet TAKE 1 TABLET BY MOUTH DAILY 90 tablet 1   No current facility-administered medications on file prior to visit.     PAST MEDICAL HISTORY: Past Medical History:  Diagnosis Date  . Abnormal vaginal Pap smear   . Acute pharyngitis 02/25/2013  . Anemia   . Anxiety   . Arthritis   . 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 Chicken pox as a child  . Chronic UTI    sees dr Terance Hart  . Constipation 12/07/2015  . Depression with anxiety 11/02/2009   Qualifier: Diagnosis of  By: [name], LPN, [name]   . Dermatitis 07/17/2012  . Esophageal stricture 1994  . Fibroids   . Foot pain, bilateral 06/19/2012  . GERD (gastroesophageal reflux disease)   . GERD (gastroesophageal reflux disease)   . Hiatal hernia   . Hyperglycemia 08/19/2013  . 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 Hyperlipidemia   . Hypertension   . Infertility, female   . Measles as a child  . Obesity   . Osteoarthritis   . Parkinson disease ([place])   . Plantar fasciitis of left foot 06/19/2012  . Preventative health care 12/19/2015  . Rosacea 10/05/2014  . Swallowing difficulty   . Urinary frequency 02/25/2013  . Visual floaters 05/11/2014    PAST SURGICAL HISTORY: Past Surgical History:  Procedure Laterality Date  . ABDOMINAL HYSTERECTOMY  2006   total  . esophageal     stretching  . laporoscopy    . PARTIAL HIP ARTHROPLASTY  2010   right  . TONSILLECTOMY    . wisdom teeth extracted      SOCIAL HISTORY: Social History    Substance Use Topics  . Smoking status: Never Smoker  . Smokeless tobacco: Never Used  . Alcohol use 2.4 oz/week    4 Glasses of wine per week     Comment: 4 glasses of wine weekly    FAMILY HISTORY: Family History  Problem Relation Age of Onset  . Cancer Mother     breast  . Other Mother     arrythmia  . Mental illness Mother     bipolar  . Hyperlipidemia Mother   . Thyroid disease Mother   . Depression Mother   . Bipolar disorder Mother   . Heart disease Father   . Arthritis Father     rheumatoid  . Hypertension Father   . Hyperlipidemia Father   . Depression Sister   . Mental illness Sister     bipolar  . Parkinson's disease Sister   .  Arthritis Sister   . Colon cancer Neg Hx   . Esophageal cancer Neg Hx   . Rectal cancer Neg Hx   . Stomach cancer Neg Hx     ROS: Review of Systems  Constitutional: Positive for malaise/fatigue.  HENT:       Mild Difficukt or Painful Swallowing  Eyes:       Wear Glasses or Contacts Floaters  Respiratory: Positive for shortness of breath (with exertion).   Cardiovascular: Positive for claudication (occasional Leg Cramping). Negative for chest pain and orthopnea.  Gastrointestinal: Positive for constipation and heartburn.       Swallowing Difficulty  Musculoskeletal: Negative for myalgias.  Neurological: Positive for tremors.  Psychiatric/Behavioral: Positive for depression.    PHYSICAL EXAM: Blood pressure 111/74, pulse 81, temperature 97.6 F (36.4 C), temperature source Oral, height 5\' 4"  (1.626 m), weight 244 lb (110.7 kg), SpO2 96 %. Body mass index is 41.88 kg/m. Physical Exam  Constitutional: She is oriented to person, place, and time. She appears well-developed and well-nourished.  Pulmonary/Chest: Effort normal.  Musculoskeletal: Normal range of motion.  Resting tremor, especially in right arm  Neurological: She is oriented to person, place, and time.  Skin: Skin is warm and dry.  Vitals  reviewed.   RECENT LABS AND TESTS: BMET    Component Value Date/Time   NA 141 11/03/2016 1030   K 4.2 11/03/2016 1030   CL 104 11/03/2016 1030   CO2 29 11/03/2016 1030   GLUCOSE 97 11/03/2016 1030   BUN 14 11/03/2016 1030   CREATININE 0.77 11/03/2016 1030   CREATININE 0.71 01/04/2015 1046   CALCIUM 9.3 11/03/2016 1030   GFRNONAA >89 01/04/2015 1046   GFRAA >89 01/04/2015 1046   Lab Results  Component Value Date   HGBA1C 6.2 11/03/2016   No results found for: INSULIN CBC    Component Value Date/Time   WBC 6.1 11/03/2016 1030   RBC 4.31 11/03/2016 1030   HGB 11.9 (L) 11/03/2016 1030   HCT 37.0 11/03/2016 1030   PLT 243.0 11/03/2016 1030   MCV 85.9 11/03/2016 1030   MCH 31.6 04/07/2013 0923   MCHC 32.1 11/03/2016 1030   RDW 15.1 11/03/2016 1030   Iron/TIBC/Ferritin/ %Sat No results found for: IRON, TIBC, FERRITIN, IRONPCTSAT Lipid Panel     Component Value Date/Time   CHOL 141 11/03/2016 1030   TRIG 103.0 11/03/2016 1030   HDL 48.00 11/03/2016 1030   CHOLHDL 3 11/03/2016 1030   VLDL 20.6 11/03/2016 1030   LDLCALC 72 11/03/2016 1030   LDLDIRECT 161.5 10/07/2008 0944   Hepatic Function Panel     Component Value Date/Time   PROT 6.9 11/03/2016 1030   ALBUMIN 4.1 11/03/2016 1030   AST 21 11/03/2016 1030   ALT 6 11/03/2016 1030   ALKPHOS 64 11/03/2016 1030   BILITOT 0.6 11/03/2016 1030   BILIDIR 0.1 05/05/2014 0948   IBILI 0.4 04/07/2013 0923      Component Value Date/Time   TSH 0.87 11/03/2016 1030   TSH 1.13 05/04/2016 0935   TSH 1.44 12/07/2015 1455    ECG  shows NSR with a rate of 82 BPM INDIRECT CALORIMETER done today shows a VO2 of 311 and a REE of 2161.    ASSESSMENT AND PLAN: Other fatigue - Plan: EKG 12-Lead, Vitamin B12, CBC With Differential, Comprehensive metabolic panel, Folate, Hemoglobin A1c, Insulin, random, T3, T4, free, TSH, VITAMIN D 25 Hydroxy (Vit-D Deficiency, Fractures)  Shortness of breath on exertion  Essential  hypertension  Other  hyperlipidemia - Plan: Lipid Panel With LDL/HDL Ratio  Parkinson disease (HCC)  Abnormal EKG  Depression screening  At risk for heart disease  Morbid obesity ([place])  PLAN:  Fatigue Kristin [name] was informed that her fatigue may be related to obesity, depression or many other causes. Labs will be ordered, and in the meanwhile Kristin [name] has agreed to work on diet, exercise and weight loss to help with fatigue. Proper sleep hygiene was discussed including the need for 7-8 hours of quality sleep each night. A sleep study was not ordered based on symptoms and Epworth score.  Dyspnea on exertion Kristin [name]'s shortness of breath appears to be obesity related and exercise induced. She has agreed to work on weight loss and gradually increase exercise to treat her exercise induced shortness of breath. If Kristin [name] follows our instructions and loses weight without improvement of her shortness of breath, we will plan to refer to pulmonology. We will monitor this condition regularly. Kristin [name] agrees to this plan.  Hypertension We discussed sodium restriction, working on healthy weight loss, and a regular exercise program as the means to achieve improved blood pressure control. Kristin [name] agreed with this plan and agreed to follow up as directed. We will continue to monitor her blood pressure as well as her progress with the above lifestyle modifications. She will continue her medications as prescribed and will watch for signs of hypotension as she continues her lifestyle modifications.  Hyperlipidemia Kristin [name] was informed of the American Heart Association Guidelines emphasizing intensive lifestyle modifications as the first line treatment for hyperlipidemia. We discussed many lifestyle modifications today in depth, and Kristin [name] will continue to work on decreasing saturated fats such as fatty red meat, butter and many fried foods. She will also increase vegetables and lean protein in her diet and continue to work on exercise  and weight loss efforts.  Abnormal EKG   Cardiovascular risk counselling Kristin [name] was given extended (at least 30 minutes) coronary artery disease prevention counseling today. She is 66 y.o. female and has risk factors for heart disease including obesity. We discussed intensive lifestyle modifications today with an emphasis on specific weight loss instructions and strategies. Pt was also informed of the importance of increasing exercise and decreasing saturated fats to help prevent heart disease.  Parkinson's   Depression Screen Kristin [name] had a moderately positive depression screening. Depression is commonly associated with obesity and often results in emotional eating behaviors. We will monitor this closely and work on CBT to help improve the non-hunger eating patterns. Referral to Psychology may be required if no improvement is seen as she continues in our clinic.  Obesity Kaylla is currently in the action stage of change and her goal is to continue with weight loss efforts She has agreed to follow the Category 2 plan Warrene has been instructed to work up to a goal of 150 minutes of combined cardio and strengthening exercise per week for weight loss and overall health benefits. We discussed the following Behavioral Modification Stratagies today: increasing lean protein intake, decreasing simple carbohydrates , increasing lower sugar fruits and dealing with family or coworker sabotage  Japji has agreed to follow up with our clinic in 2 weeks. She was informed of the importance of frequent follow up visits to maximize her success with intensive lifestyle modifications for her multiple health conditions. She was informed we would discuss her lab results at her next visit unless there is a critical issue that needs to be addressed sooner. Terea agreed to keep her next visit at  the agreed upon time to discuss these results.  I, Doreene Nest, am acting as scribe for Dennard Nip, MD  I have reviewed the  above documentation for accuracy and completeness, and I agree with the above. -Dennard Nip, MD

## 2016-11-28 LAB — COMPREHENSIVE METABOLIC PANEL
ALT: 9 IU/L (ref 0–32)
AST: 23 IU/L (ref 0–40)
Albumin/Globulin Ratio: 1.7 (ref 1.2–2.2)
Albumin: 4.3 g/dL (ref 3.6–4.8)
Alkaline Phosphatase: 79 IU/L (ref 39–117)
BUN/Creatinine Ratio: 17 (ref 12–28)
BUN: 13 mg/dL (ref 8–27)
Bilirubin Total: 0.4 mg/dL (ref 0.0–1.2)
CO2: 26 mmol/L (ref 18–29)
Calcium: 9.2 mg/dL (ref 8.7–10.3)
Chloride: 102 mmol/L (ref 96–106)
Creatinine, Ser: 0.75 mg/dL (ref 0.57–1.00)
GFR calc Af Amer: 96 mL/min/{1.73_m2} (ref >59)
GFR calc non Af Amer: 83 mL/min/{1.73_m2} (ref >59)
Globulin, Total: 2.6 g/dL (ref 1.5–4.5)
Glucose: 87 mg/dL (ref 65–99)
Potassium: 4.4 mmol/L (ref 3.5–5.2)
Sodium: 141 mmol/L (ref 134–144)
Total Protein: 6.9 g/dL (ref 6.0–8.5)

## 2016-11-28 LAB — CBC WITH DIFFERENTIAL
Basophils Absolute: 0 10*3/uL (ref 0.0–0.2)
Basos: 1 %
EOS (ABSOLUTE): 0.1 10*3/uL (ref 0.0–0.4)
Eos: 2 %
Hematocrit: 38.3 % (ref 34.0–46.6)
Hemoglobin: 12.2 g/dL (ref 11.1–15.9)
Immature Grans (Abs): 0 10*3/uL (ref 0.0–0.1)
Immature Granulocytes: 0 %
Lymphocytes Absolute: 1.8 10*3/uL (ref 0.7–3.1)
Lymphs: 32 %
MCH: 28.3 pg (ref 26.6–33.0)
MCHC: 31.9 g/dL (ref 31.5–35.7)
MCV: 89 fL (ref 79–97)
Monocytes Absolute: 0.4 10*3/uL (ref 0.1–0.9)
Monocytes: 8 %
Neutrophils Absolute: 3.2 10*3/uL (ref 1.4–7.0)
Neutrophils: 57 %
RBC: 4.31 x10E6/uL (ref 3.77–5.28)
RDW: 15.3 % (ref 12.3–15.4)
WBC: 5.5 10*3/uL (ref 3.4–10.8)

## 2016-11-28 LAB — LIPID PANEL WITH LDL/HDL RATIO
Cholesterol, Total: 180 mg/dL (ref 100–199)
HDL: 57 mg/dL (ref >39)
LDL Calculated: 88 mg/dL (ref 0–99)
LDl/HDL Ratio: 1.5 ratio units (ref 0.0–3.2)
Triglycerides: 175 mg/dL — ABNORMAL HIGH (ref 0–149)
VLDL Cholesterol Cal: 35 mg/dL (ref 5–40)

## 2016-11-28 LAB — INSULIN, RANDOM: INSULIN: 18 u[IU]/mL (ref 2.6–24.9)

## 2016-11-28 LAB — VITAMIN D 25 HYDROXY (VIT D DEFICIENCY, FRACTURES): Vit D, 25-Hydroxy: 11.8 ng/mL — ABNORMAL LOW (ref 30.0–100.0)

## 2016-11-28 LAB — HEMOGLOBIN A1C
Est. average glucose Bld gHb Est-mCnc: 128 mg/dL
Hgb A1c MFr Bld: 6.1 % — ABNORMAL HIGH (ref 4.8–5.6)

## 2016-11-28 LAB — T4, FREE: Free T4: 0.82 ng/dL (ref 0.82–1.77)

## 2016-11-28 LAB — TSH: TSH: 1.48 u[IU]/mL (ref 0.450–4.500)

## 2016-11-28 LAB — FOLATE: Folate: 13.6 ng/mL (ref >3.0)

## 2016-11-28 LAB — VITAMIN B12: Vitamin B-12: 289 pg/mL (ref 232–1245)

## 2016-11-28 LAB — T3: T3, Total: 106 ng/dL (ref 71–180)

## 2016-11-30 ENCOUNTER — Telehealth: Payer: Self-pay | Admitting: Neurology

## 2016-11-30 DIAGNOSIS — G20 Parkinson's disease: Secondary | ICD-10-CM

## 2016-11-30 DIAGNOSIS — F320 Major depressive disorder, single episode, mild: Secondary | ICD-10-CM | POA: Diagnosis not present

## 2016-11-30 NOTE — Telephone Encounter (Signed)
Referral sent and patient made aware.

## 2016-11-30 NOTE — Telephone Encounter (Signed)
Kristin [name] called wanting to see about having a referral sent to [facility] to see Kristin [name]. Tifani wants to see her in April for pole walking training. She will be going on a retreat in may. Thanks

## 2016-12-07 DIAGNOSIS — F320 Major depressive disorder, single episode, mild: Secondary | ICD-10-CM | POA: Diagnosis not present

## 2016-12-10 ENCOUNTER — Other Ambulatory Visit: Payer: Self-pay | Admitting: Family Medicine

## 2016-12-11 ENCOUNTER — Ambulatory Visit (INDEPENDENT_AMBULATORY_CARE_PROVIDER_SITE_OTHER): Payer: Medicare Other | Admitting: Family Medicine

## 2016-12-13 ENCOUNTER — Ambulatory Visit: Payer: Medicare Other | Attending: Neurology | Admitting: Physical Therapy

## 2016-12-13 DIAGNOSIS — R29818 Other symptoms and signs involving the nervous system: Secondary | ICD-10-CM | POA: Diagnosis not present

## 2016-12-13 DIAGNOSIS — R293 Abnormal posture: Secondary | ICD-10-CM

## 2016-12-13 DIAGNOSIS — R2689 Other abnormalities of gait and mobility: Secondary | ICD-10-CM | POA: Diagnosis not present

## 2016-12-13 NOTE — Therapy (Signed)
[facility] [address] [place], [place], [address] Phone: [phone]   Fax:  [phone]  Physical Therapy Evaluation  Patient Details  Name: Kristin [name] MRN: [id number] Date of Birth: [birth year] Referring Provider: Alonza Bogus, DO  Encounter Date: 12/13/2016      PT End of Session - 12/13/16 1011    Visit Number 1   Number of Visits 9   Date for PT Re-Evaluation 02/11/17   Authorization Type Medicare primary/[employer] secondary-GCODE every 10th visit   PT Start Time 0804   PT Stop Time 0850   PT Time Calculation (min) 46 min   Activity Tolerance Patient tolerated treatment well   Behavior During Therapy [facility] for tasks assessed/performed      Past Medical History:  Diagnosis Date  . Abnormal vaginal Pap smear   . Acute pharyngitis 02/25/2013  . Anemia   . Anxiety   . Arthritis   . 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 Chicken pox as a child  . Chronic UTI    sees dr Terance Hart  . Constipation 12/07/2015  . Depression with anxiety 11/02/2009   Qualifier: Diagnosis of  By: [name], LPN, [name]   . Dermatitis 07/17/2012  . Esophageal stricture 1994  . Fibroids   . Foot pain, bilateral 06/19/2012  . GERD (gastroesophageal reflux disease)   . GERD (gastroesophageal reflux disease)   . Hiatal hernia   . Hyperglycemia 08/19/2013  . 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 Hyperlipidemia   . Hypertension   . Infertility, female   . Measles as a child  . Obesity   . Osteoarthritis   . Parkinson disease ([place])   . Plantar fasciitis of left foot 06/19/2012  . Preventative health care 12/19/2015  . Rosacea 10/05/2014  . Swallowing difficulty   . Urinary frequency 02/25/2013  . Visual floaters 05/11/2014    Past Surgical History:  Procedure Laterality Date  . ABDOMINAL HYSTERECTOMY  2006   total  . esophageal     stretching  . laporoscopy    . PARTIAL HIP ARTHROPLASTY  2010   right   . TONSILLECTOMY    . wisdom teeth extracted      There were no vitals filed for this visit.       Subjective Assessment - 12/13/16 0809    Subjective Pt feels she is doing okay with strength-type activities.  Her concern is walking-just feels very slow with long distance walking.  She is planning to go to [facility] retreat in late May 2018, and she is concerned about being ready for the amount of exercise there.  Even on the treadmill, she doesn't feel that she is as fast as she used to be.  She feels bothered by balance with walking.  She explains difficulty with step negotiation in unfamiliar place in dark area last night.   Patient Stated Goals Pt's goals are to make sure she is using walking poles correctly as part of exercise and to be prepared for [facility].   Currently in Pain? No/denies  occasional arthritis pain            [facility] PT Assessment - 12/13/16 0814      Assessment   Medical Diagnosis Parkinson's disease   Referring Provider Alonza Bogus, DO   Onset Date/Surgical Date --  MD visit 10/2016     Precautions   Precautions Fall     Balance Screen   Has  the patient fallen in the past 6 months No   Has the patient had a decrease in activity level because of a fear of falling?  No   Is the patient reluctant to leave their home because of a fear of falling?  No     Home Social worker Private residence   Living Arrangements Spouse/significant other   Available Help at Discharge Family   Type of [place] to enter   Entrance Stairs-Number of Steps 1   Entrance Stairs-Rails None   Home Layout Two level;Able to live on main level with bedroom/bathroom   Home Equipment --  bilateral walking poles   Additional Comments Husband was diagnosed with leukemia last year and she takes him to appts once per week at [facility]   Leisure Weekly exercise  routine:  spin class 1-2x/wk, boxing 2x/wk, [employer] [employer], yoga once per week; walks on weekends     Observation/Other Assessments   Focus on Therapeutic Outcomes (FOTO)  NA     Posture/Postural Control   Posture/Postural Control Postural limitations   Postural Limitations Rounded Shoulders;Forward head     Tone   Assessment Location Right Lower Extremity;Left Lower Extremity     ROM / Strength   AROM / PROM / Strength Strength     Strength   Overall Strength Comments Grossly tested at least 4+/5 bilateral lower extremities     Transfers   Transfers Sit to Stand;Stand to Sit   Sit to Stand 6: Modified independent (Device/Increase time);Without upper extremity assist;From chair/3-in-1   Five time sit to stand comments  9.29   Stand to Sit 6: Modified independent (Device/Increase time);Without upper extremity assist;To chair/3-in-1     Ambulation/Gait   Ambulation/Gait Yes   Ambulation/Gait Assistance 7: Independent   Ambulation Distance (Feet) 200 Feet  indoors no device   Assistive device None  bilateral walking poles outdoors   Gait Pattern Step-through pattern;Decreased arm swing - right;Decreased dorsiflexion - right;Poor foot clearance - right;Decreased step length - right  R foot scuffs floor at least once with gait   Ambulation Surface Level;Indoor;Unlevel;Outdoor;Paved   Gait velocity 8.94 sec = 3.67 ft/sec   Gait Comments Gait using walking poles on outdoor surfaces:  649 ft in 3 minute walk test, with 3 episodes of resetting walking poles to correct sequence.     Standardized Balance Assessment   Standardized Balance Assessment Timed Up and Go Test     Timed Up and Go Test   Normal TUG (seconds) 9.6   Manual TUG (seconds) 9.47   Cognitive TUG (seconds) 9.5   TUG Comments Scores >13.5 seconds indicate increased fall risk.     High Level Balance   High Level Balance Comments MiniBESTest:  22/28 (Scores <22/28 indicate increased fall risk).  Pt has difficulty  with rising up on toes, change in gait speec, horizontal head turns, stands <4 seconds EC on foam prior to LOB/opening eyes (?decreased vestibular system use for balance)     RLE Tone   RLE Tone Mild     LLE Tone   LLE Tone Mild                             PT Short Term Goals - 12/13/16 1018      PT SHORT TERM GOAL #1  Title --   Time --   Period --   Status --     PT SHORT TERM GOAL #2   Title --   Time --   Period --   Status --     PT SHORT TERM GOAL #3   Title --   Time --   Period --   Status --     PT SHORT TERM GOAL #4   Title --   Time --   Period --   Status --     PT SHORT TERM GOAL #5   Title --   Time --   Period --   Status --           PT Long Term Goals - 12/13/16 1024      PT LONG TERM GOAL #1   Title Pt will be independent with progressive HEP for balance, gait activities.  TARGET 01/12/17   Time 5   Period Weeks   Status New     PT LONG TERM GOAL #2   Title Pt will improve MiniBESTest score to at least 24/28 for improved balance/decreased fall risk.   Time 5   Period Weeks   Status New     PT LONG TERM GOAL #3   Title Sensory Organization test to be performed, with goal to be written as appropriate.   Time 5   Period Weeks   Status New     PT LONG TERM GOAL #4   Title Pt will improve 3 minute walk test outdoors using bilateral walking poles by at least 100 ft, for improved efficiency on outdoor surfaces.   Time 5   Period Weeks   Status New     PT LONG TERM GOAL #5   Title Pt will verbalize/demonstrate improved gait stamina for outdoors with use of walking poles to at least 25 minutes, for improved posture, gait efficiency and readiness for walking program at upcoming [facility].   Time 5   Period Weeks   Status New               Plan - 12/13/16 1013    Clinical Impression Statement Pt is a 66 year old female who presents to OP PT with history of Parkinson's disease, with noted changes in  balance and slowed walking over the past 6 months to a year.  She had exercised regularly, but was limited for several months due to husband's prolonged hospitalization at [facility].  She has resumed weekly exercises, and desires to improve balance, gait, and stamina prior to returning to [place] [facility] weeklong exercise retreat in late May 2018.  See EPIC for PMH.  Pt presents with decreased balance, potential decreased vestibular system use for balance, decreased gait endurance on outdoor surfaces, abnormal posture, decreased functional strength.  Pt would benefit from skilled PT to address the above stated deficits for improved functional mobility, balance, and full participation in PD-specific exercise regimine.   Rehab Potential Excellent   PT Frequency 2x / week  1x/wk for 1 week, then    PT Duration 4 weeks   PT Treatment/Interventions ADLs/Self Care Home Management;Functional mobility training;Stair training;Gait training;Therapeutic activities;Therapeutic exercise;Balance training;Neuromuscular re-education;Patient/family education   PT Next Visit Plan Perform Sensory Organization Test (SOT), pt to bring walking poles to work on outdoor gait with walking poles; HEP based on SOT?   Recommended Other Services Per patient request, may benefit from speech therapy evaluation   Consulted and Agree with Plan of Care  Patient      Patient will benefit from skilled therapeutic intervention in order to improve the following deficits and impairments:  Abnormal gait, Decreased balance, Decreased mobility, Decreased endurance, Decreased strength, Difficulty walking, Postural dysfunction  Visit Diagnosis: Other abnormalities of gait and mobility  Other symptoms and signs involving the nervous system  Abnormal posture      G-Codes - 2017/01/10 1027    Functional Assessment Tool Used (Outpatient Only) MiniBESTest 22/28, 3 minute walk test outdoors with walking poles 649 ft, stands <4  seconds on foam EC   Functional Limitation Mobility: Walking and moving around   Mobility: Walking and Moving Around Current Status [phone]) At least 20 percent but less than 40 percent impaired, limited or restricted   Mobility: Walking and Moving Around Goal Status [phone]) At least 1 percent but less than 20 percent impaired, limited or restricted       Problem List Patient Active Problem List   Diagnosis Date Noted  . Morbid obesity ([place]) 11/27/2016  . Shortness of breath on exertion 11/27/2016  . Parkinson disease ([place]) 11/27/2016  . Preventative health care 12/19/2015  . Constipation 12/07/2015  . BCC (basal cell carcinoma of skin) 10/05/2014  . Rosacea 10/05/2014  . Parkinson's disease ([place]) 05/11/2014  . Visual floaters 05/11/2014  . Paralysis agitans ([place]) 01/08/2014  . Hyperglycemia 08/19/2013  . Urinary frequency 02/25/2013  . Screening for cervical cancer 07/17/2012  . Foot pain, bilateral 06/19/2012  . 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 Obesity 11/12/2009  . Depression with anxiety 11/02/2009  . 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 Lumbar back pain with radiculopathy affecting left lower extremity 10/17/2007  . Essential hypertension 07/05/2007  . Osteoarthritis 07/05/2007  . Hyperlipidemia 06/26/2007  . H/O: iron deficiency anemia 05/08/2007  . GERD 05/08/2007    Shepard Keltz W. 01-10-17, 10:28 AM Frazier Butt., PT [facility] [address] [place], [place], [address] Phone: [phone]   Fax:  [phone]  Name: KOURTNEY TERRIQUEZ MRN: [id number] Date of Birth: Dec 03, 1950

## 2016-12-14 DIAGNOSIS — F320 Major depressive disorder, single episode, mild: Secondary | ICD-10-CM | POA: Diagnosis not present

## 2016-12-15 ENCOUNTER — Ambulatory Visit: Payer: Medicare Other | Admitting: Physical Therapy

## 2016-12-15 DIAGNOSIS — R29818 Other symptoms and signs involving the nervous system: Secondary | ICD-10-CM

## 2016-12-15 DIAGNOSIS — R2689 Other abnormalities of gait and mobility: Secondary | ICD-10-CM

## 2016-12-15 DIAGNOSIS — R293 Abnormal posture: Secondary | ICD-10-CM | POA: Diagnosis not present

## 2016-12-15 NOTE — Therapy (Signed)
[facility] [address] [employer], [place], [address] Phone: [phone]   Fax:  [phone]  Physical Therapy Treatment  Patient Details  Name: Kristin [name] MRN: [id number] Date of Birth: [birth year] Referring Provider: Alonza Bogus, DO  Encounter Date: 12/15/2016      PT End of Session - 12/15/16 1047    Visit Number 2   Number of Visits 9   Date for PT Re-Evaluation 02/11/17   Authorization Type Medicare primary/[employer] secondary-GCODE every 10th visit   PT Start Time 0806   PT Stop Time 0849   PT Time Calculation (min) 43 min   Activity Tolerance Patient tolerated treatment well   Behavior During Therapy [facility] for tasks assessed/performed      Past Medical History:  Diagnosis Date  . Abnormal vaginal Pap smear   . Acute pharyngitis 02/25/2013  . Anemia   . Anxiety   . Arthritis   . 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 Chicken pox as a child  . Chronic UTI    sees dr Terance Hart  . Constipation 12/07/2015  . Depression with anxiety 11/02/2009   Qualifier: Diagnosis of  By: [name], LPN, [name]   . Dermatitis 07/17/2012  . Esophageal stricture 1994  . Fibroids   . Foot pain, bilateral 06/19/2012  . GERD (gastroesophageal reflux disease)   . GERD (gastroesophageal reflux disease)   . Hiatal hernia   . Hyperglycemia 08/19/2013  . 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 Hyperlipidemia   . Hypertension   . Infertility, female   . Measles as a child  . Obesity   . Osteoarthritis   . Parkinson disease ([place])   . Plantar fasciitis of left foot 06/19/2012  . Preventative health care 12/19/2015  . Rosacea 10/05/2014  . Swallowing difficulty   . Urinary frequency 02/25/2013  . Visual floaters 05/11/2014    Past Surgical History:  Procedure Laterality Date  . ABDOMINAL HYSTERECTOMY  2006   total  . esophageal     stretching  . laporoscopy    . PARTIAL HIP ARTHROPLASTY  2010   right   . TONSILLECTOMY    . wisdom teeth extracted      There were no vitals filed for this visit.      Subjective Assessment - 12/15/16 0809    Subjective Feels like her arthritis is causing pain in hands and feet.   Patient Stated Goals Pt's goals are to make sure she is using walking poles correctly as part of exercise and to be prepared for [facility].   Currently in Pain? Yes   Pain Score 5    Pain Location --  hands and feet   Pain Orientation Right;Left   Pain Descriptors / Indicators Aching   Pain Type Chronic pain   Pain Onset More than a month ago   Pain Frequency Constant   Aggravating Factors  worse in the evenings   Pain Relieving Factors pain medication alleviates somewhat                         [facility] Adult PT Treatment/Exercise - 12/15/16 0840      Standardized Balance Assessment   Standardized Balance Assessment Balance Master Testing     High Level Balance   High Level Balance Comments Sensory Organization Test performed:  Composite score 75/100.  Increased sway noted on Conditions 2 and 4.  Sensory Analysis:  decreased  somatosensory (approx 90%-just below WNL); decreased visual system use (approx 75%) ; vestibular system use WNL (approx 75%).  COG alignment noted to be posterior.     Self-Care   Self-Care Other Self-Care Comments   Other Self-Care Comments  Discussed results of Sensory Organization Test (SOT), including how to address with exercises in therapy as well as safety in real world situations, such as compliant surfaces and lowly lit areas.             Balance Exercises - 12/15/16 1045      Balance Exercises: Standing   Standing Eyes Opened Wide (BOA);Narrow base of support (BOS);Foam/compliant surface;Head turns  Head nods, 10 reps each, visual targets with head turns/nods   Standing Eyes Closed Wide (BOA);Foam/compliant surface;1 rep;10 secs           PT Education - 12/15/16 1047    Education provided Yes    Education Details Results of Sensory Organization test and how to address with exercises in therapy   Person(s) Educated Patient   Methods Explanation;Demonstration;Handout   Comprehension Verbalized understanding             PT Long Term Goals - 12/15/16 1050      PT LONG TERM GOAL #1   Title Pt will be independent with progressive HEP for balance, gait activities.  TARGET 01/12/17   Time 5   Period Weeks   Status New     PT LONG TERM GOAL #2   Title Pt will improve MiniBESTest score to at least 24/28 for improved balance/decreased fall risk.   Time 5   Period Weeks   Status New     PT LONG TERM GOAL #3   Title Pt to improve somatosensory and visual system use for balance by at least 10%, as demonstrated on Sensory Organization test.   Time 5   Period Weeks   Status New     PT LONG TERM GOAL #4   Title Pt will improve 3 minute walk test outdoors using bilateral walking poles by at least 100 ft, for improved efficiency on outdoor surfaces.   Time 5   Period Weeks   Status New     PT LONG TERM GOAL #5   Title Pt will verbalize/demonstrate improved gait stamina for outdoors with use of walking poles to at least 25 minutes, for improved posture, gait efficiency and readiness for walking program at upcoming [facility].   Time 5   Period Weeks   Status New               Plan - 12/15/16 1048    Clinical Impression Statement Sensory Organization test performed this visit, with overall balance score WNL, but decreased somatosensory and decreased visual use for balance.  Educated patient in plans to address balance deficits through exercise, including challenging somatosensory and visual components of balance.  Initiated corner balance exercises, but did not give as HEP due to time constraints.  Note modified SOT goal.   Rehab Potential Excellent   PT Frequency 2x / week  1x/wk for 1 week, then    PT Duration 4 weeks   PT Treatment/Interventions ADLs/Self Care Home  Management;Functional mobility training;Stair training;Gait training;Therapeutic activities;Therapeutic exercise;Balance training;Neuromuscular re-education;Patient/family education   PT Next Visit Plan Corner balance exercises to initiate HEP; outdoor gait wiht walking poles   Consulted and Agree with Plan of Care Patient      Patient will benefit from skilled therapeutic intervention in order to improve the following deficits  and impairments:  Abnormal gait, Decreased balance, Decreased mobility, Decreased endurance, Decreased strength, Difficulty walking, Postural dysfunction  Visit Diagnosis: Other symptoms and signs involving the nervous system  Other abnormalities of gait and mobility     Problem List Patient Active Problem List   Diagnosis Date Noted  . Morbid obesity ([place]) 11/27/2016  . Shortness of breath on exertion 11/27/2016  . Parkinson disease ([place]) 11/27/2016  . Preventative health care 12/19/2015  . Constipation 12/07/2015  . BCC (basal cell carcinoma of skin) 10/05/2014  . Rosacea 10/05/2014  . Parkinson's disease ([place]) 05/11/2014  . Visual floaters 05/11/2014  . Paralysis agitans ([place]) 01/08/2014  . Hyperglycemia 08/19/2013  . Urinary frequency 02/25/2013  . Screening for cervical cancer 07/17/2012  . Foot pain, bilateral 06/19/2012  . 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 Obesity 11/12/2009  . Depression with anxiety 11/02/2009  . 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 Lumbar back pain with radiculopathy affecting left lower extremity 10/17/2007  . Essential hypertension 07/05/2007  . Osteoarthritis 07/05/2007  . Hyperlipidemia 06/26/2007  . H/O: iron deficiency anemia 05/08/2007  . GERD 05/08/2007    Cohan Stipes W. 12/15/2016, 10:52 AM  Frazier Butt., PT  [facility] [address] [place], [place], [address] Phone: [phone]   Fax:  [phone]  Name: Kristin [name] MRN: [id number] Date of Birth: 03-21-1951

## 2016-12-20 ENCOUNTER — Ambulatory Visit (INDEPENDENT_AMBULATORY_CARE_PROVIDER_SITE_OTHER): Payer: Medicare Other | Admitting: Family Medicine

## 2016-12-20 ENCOUNTER — Ambulatory Visit: Payer: Medicare Other | Admitting: Physical Therapy

## 2016-12-20 VITALS — BP 108/73 | HR 83 | Temp 98.4°F | Ht 64.0 in | Wt 240.0 lb

## 2016-12-20 DIAGNOSIS — Z6841 Body Mass Index (BMI) 40.0 and over, adult: Secondary | ICD-10-CM | POA: Diagnosis not present

## 2016-12-20 DIAGNOSIS — Z9189 Other specified personal risk factors, not elsewhere classified: Secondary | ICD-10-CM

## 2016-12-20 DIAGNOSIS — R2689 Other abnormalities of gait and mobility: Secondary | ICD-10-CM

## 2016-12-20 DIAGNOSIS — E559 Vitamin D deficiency, unspecified: Secondary | ICD-10-CM | POA: Diagnosis not present

## 2016-12-20 DIAGNOSIS — R29818 Other symptoms and signs involving the nervous system: Secondary | ICD-10-CM

## 2016-12-20 DIAGNOSIS — R293 Abnormal posture: Secondary | ICD-10-CM | POA: Diagnosis not present

## 2016-12-20 DIAGNOSIS — R7303 Prediabetes: Secondary | ICD-10-CM | POA: Diagnosis not present

## 2016-12-20 MED ORDER — VITAMIN D (ERGOCALCIFEROL) 1.25 MG (50000 UNIT) PO CAPS: 50000.0000 [IU] | ORAL_CAPSULE | ORAL | 0 refills | Status: DC

## 2016-12-20 NOTE — Progress Notes (Signed)
Office: [phone]  /  Fax: [phone]   HPI:   Chief Complaint: OBESITY Kristin [name] is here to discuss her progress with her obesity treatment plan. She is following her eating plan approximately 80 % of the time and states she is exercising 60 minutes 5 times per week. Kristin [name] continues to do well with weight loss but notes she does a lot of social eating. She is worried about choking while eating out with her Parkinson's diagnosis. She is getting bored with her plan and would like to have other options. Her weight is 240 lb (108.9 kg) today and has had a weight loss of 4 pounds over a period of 3 weeks since her last visit. She has lost 4 lbs since starting treatment with [place].  Vitamin D deficiency Kristin [name] has a new diagnosis of vitamin D deficiency, labs were low at 11.8 She is not currently taking vit D and denies nausea, vomiting or muscle weakness.  Pre-Diabetes Kristin [name] has a diagnosis of prediabetes based on her elevated Hgb A1c and elevated insulin. She was informed this puts her at greater risk of developing diabetes. She is not taking metformin currently and continues to work on diet and exercise to decrease risk of diabetes. She notes decreased polyphagia on diet and denies nausea or hypoglycemia.   Wt Readings from Last 500 Encounters:  12/20/16 240 lb (108.9 kg)  11/27/16 244 lb (110.7 kg)  10/31/16 253 lb (114.8 kg)  05/04/16 236 lb (107 kg)  03/14/16 231 lb (104.8 kg)  12/07/15 230 lb 4 oz (104.4 kg)  12/01/15 232 lb (105.2 kg)  08/12/15 231 lb (104.8 kg)  05/12/15 230 lb (104.3 kg)  02/09/15 233 lb (105.7 kg)  01/04/15 234 lb 4 oz (106.3 kg)  11/11/14 234 lb (106.1 kg)  10/05/14 237 lb 6.4 oz (107.7 kg)  07/15/14 229 lb 9.6 oz (104.1 kg)  07/06/14 230 lb 3.2 oz (104.4 kg)  05/05/14 226 lb 6.4 oz (102.7 kg)  03/09/14 225 lb (102.1 kg)  01/08/14 227 lb (103 kg)  01/05/14 229 lb 1.9 oz (103.9 kg)  10/03/13 226 lb (102.5 kg)  08/19/13 230 lb 1.3 oz (104.4 kg)  07/10/13 227  lb 0.6 oz (103 kg)  04/07/13 232 lb (105.2 kg)  02/25/13 232 lb 1.9 oz (105.3 kg)  02/10/13 238 lb 0.6 oz (108 kg)  11/28/12 233 lb 1.3 oz (105.7 kg)  10/03/12 239 lb (108.4 kg)  09/19/12 239 lb 3.2 oz (108.5 kg)  09/05/12 237 lb (107.5 kg)  08/22/12 232 lb (105.2 kg)  07/17/12 232 lb 12.8 oz (105.6 kg)  06/19/12 237 lb (107.5 kg)  02/28/12 235 lb (106.6 kg)  02/15/12 237 lb 6.4 oz (107.7 kg)  05/18/11 234 lb (106.1 kg)  09/29/10 (!) 236 lb (107 kg)  02/18/10 (!) 230 lb (104.3 kg)  12/14/09 (!) 238 lb (108 kg)  11/02/09 (!) 236 lb (107 kg)  07/08/09 (!) 226 lb (102.5 kg)  01/01/09 (!) 228 lb (103.4 kg)  12/03/08 (!) 229 lb (103.9 kg)  11/25/08 (!) 231 lb 6.1 oz (105 kg)  11/03/08 (!) 228 lb (103.4 kg)  08/06/08 (!) 229 lb (103.9 kg)  10/17/07 (!) 237 lb (107.5 kg)  07/05/07 (!) 238 lb (108 kg)     ALLERGIES: Allergies  Allergen Reactions   Bactrim [Sulfamethoxazole-Trimethoprim] Other (See Comments)    Oral ulcers and rash   Penicillins Hives    MEDICATIONS: Current Outpatient Prescriptions on File Prior to Visit  Medication Sig Dispense  Refill   ALPRAZolam (XANAX) 0.5 MG tablet Take 1 tablet (0.5 mg total) by mouth as needed for anxiety or sleep. 30 tablet 1   carbidopa-levodopa (SINEMET IR) 25-100 MG tablet take 1 tablet by mouth three times a day (Patient not taking: Reported on 12/20/2016) 90 tablet 5   carbidopa-levodopa (SINEMET IR) 25-100 MG tablet take 1 tablet by mouth three times a day (Patient not taking: Reported on 12/13/2016) 90 tablet 5   DULoxetine (CYMBALTA) 30 MG capsule take 1 capsule by mouth once daily 90 capsule 1   escitalopram (LEXAPRO) 20 MG tablet take 1 tablet by mouth once daily 90 tablet 2   Krill Oil CAPS 1 krill oil caps daily, MegaRed caps by Schiff (Patient not taking: Reported on 12/13/2016)     omeprazole (PRILOSEC) 20 MG capsule take 1 capsule by mouth once daily 90 capsule 1   pramipexole (MIRAPEX) 0.5 MG tablet TAKE 2 TABLETS  BY MOUTH IN THE MORNING 1 TABLET IN THE AFTERNOON AND 1 TABLET IN THE EVENING 360 tablet 3   rosuvastatin (CRESTOR) 10 MG tablet TAKE 1 TABLET BY MOUTH ON MONDAYS, WEDNESDAY, AND FRIDAYS 90 tablet 2   telmisartan-hydrochlorothiazide (MICARDIS HCT) 80-12.5 MG tablet TAKE 1 TABLET BY MOUTH DAILY (Patient taking differently: Take 0.5 tablets by mouth daily. ) 90 tablet 1   No current facility-administered medications on file prior to visit.     PAST MEDICAL HISTORY: Past Medical History:  Diagnosis Date   Abnormal vaginal Pap smear    Acute pharyngitis 02/25/2013   Anemia    Anxiety    Arthritis    BCC (basal cell carcinoma of skin) 10/05/2014   On back   Chicken pox as a child   Chronic UTI    sees dr Terance Hart   Constipation 12/07/2015   Depression with anxiety 11/02/2009   Qualifier: Diagnosis of  By: [name], LPN, [place] M    Dermatitis 07/17/2012   Esophageal stricture 1994   Fibroids    Foot pain, bilateral 06/19/2012   GERD (gastroesophageal reflux disease)    GERD (gastroesophageal reflux disease)    Hiatal hernia    Hyperglycemia 08/19/2013   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   Hyperlipidemia    Hypertension    Infertility, female    Measles as a child   Obesity    Osteoarthritis    Parkinson disease ([place])    Plantar fasciitis of left foot 06/19/2012   Preventative health care 12/19/2015   Rosacea 10/05/2014   Swallowing difficulty    Urinary frequency 02/25/2013   Visual floaters 05/11/2014    PAST SURGICAL HISTORY: Past Surgical History:  Procedure Laterality Date   ABDOMINAL HYSTERECTOMY  2006   total   esophageal     stretching   laporoscopy     PARTIAL HIP ARTHROPLASTY  2010   right   TONSILLECTOMY     wisdom teeth extracted      SOCIAL HISTORY: Social History  Substance Use Topics   Smoking status: Never Smoker   Smokeless tobacco: Never Used   Alcohol use 2.4 oz/week    4 Glasses of wine per week      Comment: 4 glasses of wine weekly    FAMILY HISTORY: Family History  Problem Relation Age of Onset   Cancer Mother     breast   Other Mother     arrythmia   Mental illness Mother     bipolar   Hyperlipidemia Mother    Thyroid disease Mother  Depression Mother    Bipolar disorder Mother    Heart disease Father    Arthritis Father     rheumatoid   Hypertension Father    Hyperlipidemia Father    Depression Sister    Mental illness Sister     bipolar   Parkinson's disease Sister    Arthritis Sister    Colon cancer Neg Hx    Esophageal cancer Neg Hx    Rectal cancer Neg Hx    Stomach cancer Neg Hx     ROS: Review of Systems  Constitutional: Positive for weight loss.  Gastrointestinal: Negative for nausea and vomiting.  Musculoskeletal:       Negative muscle weakness  Endo/Heme/Allergies:       Polyphagia Negative hypoglycemia    PHYSICAL EXAM: Blood pressure 108/73, pulse 83, temperature 98.4 F (36.9 C), temperature source Oral, height 5\' 4"  (1.626 m), weight 240 lb (108.9 kg), SpO2 100 %. Body mass index is 41.2 kg/m. Physical Exam  Constitutional: She is oriented to person, place, and time. She appears well-developed.  Cardiovascular: Normal rate.   Pulmonary/Chest: Effort normal.  Musculoskeletal: Normal range of motion.  Neurological: She is oriented to person, place, and time.  Skin: Skin is warm and dry.  Psychiatric: She has a normal mood and affect. Her behavior is normal.  Vitals reviewed.   RECENT LABS AND TESTS: BMET    Component Value Date/Time   NA 141 11/27/2016 1123   K 4.4 11/27/2016 1123   CL 102 11/27/2016 1123   CO2 26 11/27/2016 1123   GLUCOSE 87 11/27/2016 1123   GLUCOSE 97 11/03/2016 1030   BUN 13 11/27/2016 1123   CREATININE 0.75 11/27/2016 1123   CREATININE 0.71 01/04/2015 1046   CALCIUM 9.2 11/27/2016 1123   GFRNONAA 83 11/27/2016 1123   GFRNONAA >89 01/04/2015 1046   GFRAA 96 11/27/2016 1123   GFRAA  >89 01/04/2015 1046   Lab Results  Component Value Date   HGBA1C 6.1 (H) 11/27/2016   HGBA1C 6.2 11/03/2016   HGBA1C 6.0 05/04/2016   HGBA1C 6.1 12/07/2015   HGBA1C 5.9 01/04/2015   Lab Results  Component Value Date   INSULIN 18.0 11/27/2016   CBC    Component Value Date/Time   WBC 5.5 11/27/2016 1123   WBC 6.1 11/03/2016 1030   RBC 4.31 11/27/2016 1123   RBC 4.31 11/03/2016 1030   HGB 11.9 (L) 11/03/2016 1030   HCT 38.3 11/27/2016 1123   PLT 243.0 11/03/2016 1030   MCV 89 11/27/2016 1123   MCH 28.3 11/27/2016 1123   MCH 31.6 04/07/2013 0923   MCHC 31.9 11/27/2016 1123   MCHC 32.1 11/03/2016 1030   RDW 15.3 11/27/2016 1123   LYMPHSABS 1.8 11/27/2016 1123   EOSABS 0.1 11/27/2016 1123   BASOSABS 0.0 11/27/2016 1123   Iron/TIBC/Ferritin/ %Sat No results found for: IRON, TIBC, FERRITIN, IRONPCTSAT Lipid Panel     Component Value Date/Time   CHOL 180 11/27/2016 1123   TRIG 175 (H) 11/27/2016 1123   HDL 57 11/27/2016 1123   CHOLHDL 3 11/03/2016 1030   VLDL 20.6 11/03/2016 1030   LDLCALC 88 11/27/2016 1123   LDLDIRECT 161.5 10/07/2008 0944   Hepatic Function Panel     Component Value Date/Time   PROT 6.9 11/27/2016 1123   ALBUMIN 4.3 11/27/2016 1123   AST 23 11/27/2016 1123   ALT 9 11/27/2016 1123   ALKPHOS 79 11/27/2016 1123   BILITOT 0.4 11/27/2016 1123   BILIDIR 0.1 05/05/2014 0948  IBILI 0.4 04/07/2013 0923      Component Value Date/Time   TSH 1.480 11/27/2016 1123   TSH 0.87 11/03/2016 1030   TSH 1.13 05/04/2016 0935    ASSESSMENT AND PLAN: Vitamin D deficiency - Plan: Vitamin D, Ergocalciferol, (DRISDOL) 50000 units CAPS capsule  Prediabetes  At risk for diabetes mellitus  Morbid obesity ([place])  PLAN:  Vitamin D Deficiency Kristin [name] was informed that low vitamin D levels contributes to fatigue and are associated with obesity, breast, and colon cancer. She agrees to start to take prescription Vit D @50 ,000 IU every week #4 with no refills and  will follow up for routine testing of vitamin D, at least 2-3 times per year. She was informed of the risk of over-replacement of vitamin D and agrees to not increase her dose unless he discusses this with [place] first. Kristin [name] agrees to follow up with our clinic in 2 weeks.  Pre-Diabetes Kristin [name] will continue to work on weight loss, exercise, and decreasing simple carbohydrates in her diet to help decrease the risk of diabetes. We dicussed metformin including benefits and risks. She was informed that eating too many simple carbohydrates or too many calories at one sitting increases the likelihood of GI side effects. Kristin [name] declined metformin for now and a prescription was not written today. Kristin [name] agreed to follow up with [place] as directed to monitor her progress.  Obesity Kristin [name] is currently in the action stage of change. As such, her goal is to continue with weight loss efforts She has agreed to keep a food journal with 350 to 500 calories and 30+ grams of protein daily and category 2 otherwise Kristin [name] has been instructed to work up to a goal of 150 minutes of combined cardio and strengthening exercise per week for weight loss and overall health benefits. We discussed the following Behavioral Modification Stratagies today: Placing fork down between bites and chew at least 10 times per bite, drink with a straw, no talking and drinking at same time.  Kristin [name] has agreed to follow up with our clinic in 2 weeks. She was informed of the importance of frequent follow up visits to maximize her success with intensive lifestyle modifications for her multiple health conditions.  I, Doreene Nest, am acting as scribe for Dennard Nip, MD  I have reviewed the above documentation for accuracy and completeness, and I agree with the above. -Dennard Nip, MD

## 2016-12-20 NOTE — Patient Instructions (Signed)
TIPS for Walking Poles  -Take LONG strides -Keep your posture upright and tall -Start by dragging the poles behind you, then push through your poles (opposite arm and leg), making sure the pole isn't going beyond your opposite heel -Listen for the rhythm, cadence of the poles -If you get off sequence-just start again by dragging the poles behind you  Start by walking with/without the poles using your best posture, long strides and best arm swing -Start 10 minutes no poles, 10 minutes using poles

## 2016-12-20 NOTE — Therapy (Signed)
[facility] [address] [place], [place], [address] Phone: [phone]   Fax:  [phone]  Physical Therapy Treatment  Patient Details  Name: Kristin [name] MRN: [id number] Date of Birth: [birth year] Referring Provider: Alonza Bogus, DO  Encounter Date: 12/20/2016      PT End of Session - 12/20/16 0900    Visit Number 3   Number of Visits 9   Date for PT Re-Evaluation 02/11/17   Authorization Type Medicare primary/[employer] secondary-GCODE every 10th visit   PT Start Time 0803   PT Stop Time 0847   PT Time Calculation (min) 44 min   Activity Tolerance Patient tolerated treatment well   Behavior During Therapy [facility] for tasks assessed/performed      Past Medical History:  Diagnosis Date  . Abnormal vaginal Pap smear   . Acute pharyngitis 02/25/2013  . Anemia   . Anxiety   . Arthritis   . 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 Chicken pox as a child  . Chronic UTI    sees dr Terance Hart  . Constipation 12/07/2015  . Depression with anxiety 11/02/2009   Qualifier: Diagnosis of  By: [name], LPN, [name]   . Dermatitis 07/17/2012  . Esophageal stricture 1994  . Fibroids   . Foot pain, bilateral 06/19/2012  . GERD (gastroesophageal reflux disease)   . GERD (gastroesophageal reflux disease)   . Hiatal hernia   . Hyperglycemia 08/19/2013  . 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 Hyperlipidemia   . Hypertension   . Infertility, female   . Measles as a child  . Obesity   . Osteoarthritis   . Parkinson disease ([place])   . Plantar fasciitis of left foot 06/19/2012  . Preventative health care 12/19/2015  . Rosacea 10/05/2014  . Swallowing difficulty   . Urinary frequency 02/25/2013  . Visual floaters 05/11/2014    Past Surgical History:  Procedure Laterality Date  . ABDOMINAL HYSTERECTOMY  2006   total  . esophageal     stretching  . laporoscopy    . PARTIAL HIP ARTHROPLASTY  2010   right   . TONSILLECTOMY    . wisdom teeth extracted      There were no vitals filed for this visit.      Subjective Assessment - 12/20/16 0805    Subjective Nothing new today.   Patient Stated Goals Pt's goals are to make sure she is using walking poles correctly as part of exercise and to be prepared for [facility].   Currently in Pain? Yes   Pain Score 4    Pain Location Foot   Pain Orientation Right;Left   Pain Onset More than a month ago   Pain Frequency Constant   Aggravating Factors  worse in the evenings   Pain Relieving Factors better when shoes are on.                         [place] Adult PT Treatment/Exercise - 12/20/16 0813      Ambulation/Gait   Ambulation/Gait Yes   Ambulation/Gait Assistance 5: Supervision;7: Independent   Ambulation/Gait Assistance Details Gait training using bilateral walking poles, with initial instruction in sequence, posture, stride length.  Gait training paved outdoor surfaces x 600 ft using bilateral walking poles, with pt needing to reset sequence 3 times.  Then gait training along sidewalk (no poles) x 850 ft, then with bilateral walking poles x 850  ft with occasional cues for resetting sequence with poles.     Ambulation Distance (Feet) 400 Feet   Assistive device None  bilateral walking poles   Gait Pattern Step-through pattern;Decreased arm swing - right;Decreased dorsiflexion - right;Poor foot clearance - right;Decreased step length - right   Ambulation Surface Level;Indoor;Unlevel;Outdoor;Paved   Pre-Gait Activities Adjusted walking poles by lowering them to adjust correctly to patient's height.  Discussed not only pt's practice with walking poles to improve confidence, but also needing to focus on increased endurance for walking (with and without walking poles)           [facility]) - 12/20/16 0857    PWR! exercises Moves in standing   PWR! Up x 5 reps   PWR! Rock x 5 reps each side   PWR! Twist x 3 reps each side    PWR Step x 3 reps forward, x 3 reps side, x 3 reps back (each side)   Comments Using bilateral walking poles as UE support, following walking activities, as dynamic  stretching in standing position             PT Education - 12/20/16 0859    Education provided Yes   Education Details Walking poles sequence/appropriate height, tips on optimal use of walking poles   Person(s) Educated Patient   Methods Explanation;Demonstration;Handout   Comprehension Verbalized understanding;Returned demonstration;Need further instruction;Verbal cues required            PT Long Term Goals - 12/15/16 1050      PT LONG TERM GOAL #1   Title Pt will be independent with progressive HEP for balance, gait activities.  TARGET 01/12/17   Time 5   Period Weeks   Status New     PT LONG TERM GOAL #2   Title Pt will improve MiniBESTest score to at least 24/28 for improved balance/decreased fall risk.   Time 5   Period Weeks   Status New     PT LONG TERM GOAL #3   Title Pt to improve somatosensory and visual system use for balance by at least 10%, as demonstrated on Sensory Organization test.   Time 5   Period Weeks   Status New     PT LONG TERM GOAL #4   Title Pt will improve 3 minute walk test outdoors using bilateral walking poles by at least 100 ft, for improved efficiency on outdoor surfaces.   Time 5   Period Weeks   Status New     PT LONG TERM GOAL #5   Title Pt will verbalize/demonstrate improved gait stamina for outdoors with use of walking poles to at least 25 minutes, for improved posture, gait efficiency and readiness for walking program at upcoming [facility].   Time 5   Period Weeks   Status New               Plan - 12/20/16 0900    Clinical Impression Statement Treatment session focused today on walking pole instruction, height adjustment and initial discussion on how to progress with independence, safety, confidence and endurance as patient prepares for weeklong [facility]  Ex[facility] Retreat, where she will be expected to walk one hour with walking poles each day.  Today, pt ambulates with/without poles for approx 25 minutes total, with brief rest breaks.  Pt will continue to benefit from skilled PT to address gait, balance for improved functional mobility.   Rehab Potential Excellent   PT Frequency 2x / week  1x/wk for 1  week, then    PT Duration 4 weeks   PT Treatment/Interventions ADLs/Self Care Home Management;Functional mobility training;Stair training;Gait training;Therapeutic activities;Therapeutic exercise;Balance training;Neuromuscular re-education;Patient/family education   PT Next Visit Plan Outdoor gait with walking poles (provide with handout for progression for home); corner balance exercises to initiate HEP   Consulted and Agree with Plan of Care Patient      Patient will benefit from skilled therapeutic intervention in order to improve the following deficits and impairments:  Abnormal gait, Decreased balance, Decreased mobility, Decreased endurance, Decreased strength, Difficulty walking, Postural dysfunction  Visit Diagnosis: Other abnormalities of gait and mobility  Other symptoms and signs involving the nervous system     Problem List Patient Active Problem List   Diagnosis Date Noted  . Morbid obesity ([place]) 11/27/2016  . Shortness of breath on exertion 11/27/2016  . Parkinson disease ([place]) 11/27/2016  . Preventative health care 12/19/2015  . Constipation 12/07/2015  . BCC (basal cell carcinoma of skin) 10/05/2014  . Rosacea 10/05/2014  . Parkinson's disease ([place]) 05/11/2014  . Visual floaters 05/11/2014  . Paralysis agitans ([place]) 01/08/2014  . Hyperglycemia 08/19/2013  . Urinary frequency 02/25/2013  . Screening for cervical cancer 07/17/2012  . Foot pain, bilateral 06/19/2012  . 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 Obesity 11/12/2009  . Depression with anxiety 11/02/2009  . 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  Lumbar back pain with radiculopathy affecting left lower extremity 10/17/2007  . Essential hypertension 07/05/2007  . Osteoarthritis 07/05/2007  . Hyperlipidemia 06/26/2007  . H/O: iron deficiency anemia 05/08/2007  . GERD 05/08/2007    Tavin Vernet W. 12/20/2016, 9:05 AM  Frazier Butt., PT [facility] [address] [place], [place], [address] Phone: [phone]   Fax:  [phone]  Name: LILA LUFKIN MRN: [id number] Date of Birth: Jun 03, 1951

## 2016-12-21 ENCOUNTER — Ambulatory Visit: Payer: Medicare Other | Admitting: Physical Therapy

## 2016-12-21 DIAGNOSIS — F320 Major depressive disorder, single episode, mild: Secondary | ICD-10-CM | POA: Diagnosis not present

## 2016-12-21 DIAGNOSIS — R2689 Other abnormalities of gait and mobility: Secondary | ICD-10-CM | POA: Diagnosis not present

## 2016-12-21 DIAGNOSIS — R29818 Other symptoms and signs involving the nervous system: Secondary | ICD-10-CM

## 2016-12-21 DIAGNOSIS — R293 Abnormal posture: Secondary | ICD-10-CM | POA: Diagnosis not present

## 2016-12-21 NOTE — Patient Instructions (Addendum)
(  Week) Monday Tuesday Wednesday Thursday Friday Saturday Sunday   4/16-4/22   NP=20 min P= 15 min Total=35 min         4/23-4/29   NP=20 min P= 20 min Total = 40 min         4/30-5/6   NP= 25 min P= 25 min Total = 50 min         5/7-5/13   NP= 25 min P= 30 min Total= 55 min         5/14-5/20   NP= 20 min P= 40 min Total = 60 min         5/21-   Keep it up! ENJOY! Work Hard!                     NP= No Poles-Tall posture, big steps, heelstrike, arm swing P= Poles  -Use the poles to gently stretch after a few minutes of warm up walking.  Use the poles to stretch using PWR! Moves after walking.   -Intervals of walking:  Walk a short time or short distance at faster pace, longer stride length-PUSH Yourself! Several times through the walking

## 2016-12-21 NOTE — Therapy (Signed)
[facility] [address] [place], [place], [address] Phone: [phone]   Fax:  [phone]  Physical Therapy Treatment  Patient Details  Name: Kristin [name] MRN: [id number] Date of Birth: 30-Jul-1951 Referring Provider: Alonza Bogus, DO  Encounter Date: 12/21/2016      PT End of Session - 12/21/16 0956    Visit Number 4   Number of Visits 9   Date for PT Re-Evaluation 02/11/17   Authorization Type Medicare primary/[employer] secondary-GCODE every 10th visit   PT Start Time [phone]  Pt arrives late   PT Stop Time 0935   PT Time Calculation (min) 43 min   Activity Tolerance Patient tolerated treatment well   Behavior During Therapy [facility] for tasks assessed/performed      Past Medical History:  Diagnosis Date  . Abnormal vaginal Pap smear   . Acute pharyngitis 02/25/2013  . Anemia   . Anxiety   . Arthritis   . 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 Chicken pox as a child  . Chronic UTI    sees dr Terance Hart  . Constipation 12/07/2015  . Depression with anxiety 11/02/2009   Qualifier: Diagnosis of  By: [name], LPN, [name]   . Dermatitis 07/17/2012  . Esophageal stricture 1994  . Fibroids   . Foot pain, bilateral 06/19/2012  . GERD (gastroesophageal reflux disease)   . GERD (gastroesophageal reflux disease)   . Hiatal hernia   . Hyperglycemia 08/19/2013  . 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 Hyperlipidemia   . Hypertension   . Infertility, female   . Measles as a child  . Obesity   . Osteoarthritis   . Parkinson disease ([place])   . Plantar fasciitis of left foot 06/19/2012  . Preventative health care 12/19/2015  . Rosacea 10/05/2014  . Swallowing difficulty   . Urinary frequency 02/25/2013  . Visual floaters 05/11/2014    Past Surgical History:  Procedure Laterality Date  . ABDOMINAL HYSTERECTOMY  2006   total  . esophageal     stretching  . laporoscopy    . PARTIAL HIP  ARTHROPLASTY  2010   right  . TONSILLECTOMY    . wisdom teeth extracted      There were no vitals filed for this visit.      Subjective Assessment - 12/21/16 0856    Subjective Typically just stiff in the mornings.  Did the doorway stretches (PWR! Moves) this morning and I feel better.   Patient Stated Goals Pt's goals are to make sure she is using walking poles correctly as part of exercise and to be prepared for [facility] Retreat.   Currently in Pain? No/denies   Pain Onset More than a month ago                         [facility] Adult PT Treatment/Exercise - 12/21/16 0938      Ambulation/Gait   Ambulation/Gait Yes   Ambulation/Gait Assistance 5: Supervision;7: Independent   Ambulation/Gait Assistance Details Gait training using bilateral walking poles, paved outdoor surfaces x 1400 ft (15 minutes total)    Ambulation Distance (Feet) 1400 Feet  bilateral poles, then 800 ft no poles    Assistive device None  bilateral walking poles   Gait Pattern Step-through pattern;Decreased arm swing - right;Decreased dorsiflexion - right;Poor foot clearance - right;Decreased step length - right   Ambulation Surface Level;Indoor;Unlevel;Outdoor;Paved;Grass   Gait Comments During gait  with walking poles, pt performs 3 30-45 second intervals of increased walking pace.       Self-Care   Self-Care Other Self-Care Comments   Other Self-Care Comments  Designed and provided handout for progression of walking program using walking poles/no poles over the next 5 weeks in preparation for [facility]reat beginning 5/24     Following gait outdoors, pt rates exertion as 6/10 on modified RPE scale.  HR 88-95 bpm.       [facility]) - 12/21/16 0952    PWR! exercises Moves in standing  As warm up, cool-down stretches using walking poles   PWR! Up x 3 reps warm up  x 5 reps cool-down (3 second hold)   PWR! Rock x 3 reps each side warm-up  x 5 reps each side cool-down, 3 sec hold   PWR! Twist x 3  reps warm up  x 5 reps each side cool down, 3 sec each   PWR Step Marching using walking poles x 10  PWR! Step forward, side, back 5 reps each, 3 sec hold   Comments Using bilateral walking poles              PT Education - 12/21/16 0955    Education provided Yes   Education Details Progression of walking pole program-see instructions   Person(s) Educated Patient   Methods Explanation;Demonstration;Handout   Comprehension Verbalized understanding;Returned demonstration             PT Long Term Goals - 12/15/16 1050      PT LONG TERM GOAL #1   Title Pt will be independent with progressive HEP for balance, gait activities.  TARGET 01/12/17   Time 5   Period Weeks   Status New     PT LONG TERM GOAL #2   Title Pt will improve MiniBESTest score to at least 24/28 for improved balance/decreased fall risk.   Time 5   Period Weeks   Status New     PT LONG TERM GOAL #3   Title Pt to improve somatosensory and visual system use for balance by at least 10%, as demonstrated on Sensory Organization test.   Time 5   Period Weeks   Status New     PT LONG TERM GOAL #4   Title Pt will improve 3 minute walk test outdoors using bilateral walking poles by at least 100 ft, for improved efficiency on outdoor surfaces.   Time 5   Period Weeks   Status New     PT LONG TERM GOAL #5   Title Pt will verbalize/demonstrate improved gait stamina for outdoors with use of walking poles to at least 25 minutes, for improved posture, gait efficiency and readiness for walking program at upcoming [facility].   Time 5   Period Weeks   Status New               Plan - 12/21/16 0956    Clinical Impression Statement Treatment session today focused on comprehensive walking program instruction, including progression of use of poles/no poles for walking, stretches, and exercise chart to track progression.  Pt able to ambulate 15 minutes outdoor gait with bilateral walking poles, with 1  standing break to perform warm-up stretches.  Pt able to better sequence walking poles today with improved consistency.   Rehab Potential Excellent   PT Frequency 2x / week  1x/wk for 1 week, then    PT Duration 4 weeks   PT Treatment/Interventions ADLs/Self Care Home Management;Functional  mobility training;Stair training;Gait training;Therapeutic activities;Therapeutic exercise;Balance training;Neuromuscular re-education;Patient/family education   PT Next Visit Plan Review exercise chart for walking program progression; corner balance exercises to initiate HEP; work on intervals with walking poles   Consulted and Agree with Plan of Care Patient      Patient will benefit from skilled therapeutic intervention in order to improve the following deficits and impairments:  Abnormal gait, Decreased balance, Decreased mobility, Decreased endurance, Decreased strength, Difficulty walking, Postural dysfunction  Visit Diagnosis: Other abnormalities of gait and mobility  Other symptoms and signs involving the nervous system     Problem List Patient Active Problem List   Diagnosis Date Noted  . Vitamin D deficiency 12/20/2016  . Prediabetes 12/20/2016  . Morbid obesity ([place]) 11/27/2016  . Shortness of breath on exertion 11/27/2016  . Parkinson disease ([place]) 11/27/2016  . Preventative health care 12/19/2015  . Constipation 12/07/2015  . BCC (basal cell carcinoma of skin) 10/05/2014  . Rosacea 10/05/2014  . Parkinson's disease ([place]) 05/11/2014  . Visual floaters 05/11/2014  . Paralysis agitans ([place]) 01/08/2014  . Hyperglycemia 08/19/2013  . Urinary frequency 02/25/2013  . Screening for cervical cancer 07/17/2012  . Foot pain, bilateral 06/19/2012  . 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 Obesity 11/12/2009  . Depression with anxiety 11/02/2009  . 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 Lumbar back pain with radiculopathy affecting left lower extremity 10/17/2007  . Essential  hypertension 07/05/2007  . Osteoarthritis 07/05/2007  . Hyperlipidemia 06/26/2007  . H/O: iron deficiency anemia 05/08/2007  . GERD 05/08/2007    Dalasia Predmore W. 12/21/2016, 10:01 AM  Frazier Butt., PT  [facility] [address] [place], [place], [address] Phone: [phone]   Fax:  [phone]  Name: Kristin [name] MRN: [id number] Date of Birth: 08/24/1951

## 2016-12-27 ENCOUNTER — Ambulatory Visit: Payer: Medicare Other | Admitting: Physical Therapy

## 2016-12-27 DIAGNOSIS — R293 Abnormal posture: Secondary | ICD-10-CM | POA: Diagnosis not present

## 2016-12-27 DIAGNOSIS — R2689 Other abnormalities of gait and mobility: Secondary | ICD-10-CM

## 2016-12-27 DIAGNOSIS — R29818 Other symptoms and signs involving the nervous system: Secondary | ICD-10-CM

## 2016-12-27 NOTE — Patient Instructions (Addendum)
Feet Apart (Compliant Surface) Head Motion - Eyes Open    With eyes open, standing on compliant surface: __pillow or towel______, feet shoulder width apart, move head slowly: up and down. Repeat __10__ times per session, then 10 times sided to side.  Do __1-2__ sessions per day.  Copyright  VHI. All rights reserved.  Feet Apart (Compliant Surface) Head Motion - Eyes Closed    Stand on compliant surface: _pillow or towel_______ with feet shoulder width apart. Close eyes and move head slowly, up and down. Repeat __10__ times per session, then 10 times side to side. Do _1-2___ sessions per day.  Copyright  VHI. All rights reserved.  Feet Together (Compliant Surface) Head Motion - Eyes Open    With eyes open, standing on compliant surface: __pillow or towel______, feet together, move head slowly: up and down. Repeat _10___ times per session, then 10 times side to side. Do __1-2__ sessions per day.  Copyright  VHI. All rights reserved.  Feet Together, Varied Arm Positions - Eyes Closed    Stand with feet together and arms out. Close eyes and visualize upright position. Hold _10___ seconds. Repeat _3___ times per session. Do _1-2___ sessions per day.  Copyright  VHI. All rights reserved.  Weight Shift: Anterior / Posterior (Limits of Stability)    Standing on pillow or towel.  Slowly shift weight backward until toes begin to rise off floor. Return to starting position. Shift weight slowly forward until heels begin to rise off floor. Hold each position __3__ seconds. Repeat _2 sets of 10___ times per session. Do __1-2__ sessions per day.   Copyright  VHI. All rights reserved.  Weight Shift: Anterior / Posterior (Righting / Equilibrium)    Slowly shift weight forward, arms back and hips forward over toes, until heels rise off floor. Return to starting position. Shift weight backward, arms forward and hips back over heels, until toes rise off floor. Hold each position _2-3___  seconds. Repeat _2 sets of 10___ times per session. Do __1-2__ sessions per day.  Copyright  VHI. All rights reserved.

## 2016-12-27 NOTE — Therapy (Signed)
[facility] [address] [place], [place], [address] Phone: [phone]   Fax:  [phone]  Physical Therapy Treatment  Patient Details  Name: Kristin [name] MRN: [id number] Date of Birth: 11-19-1950 Referring Provider: Alonza Bogus, DO  Encounter Date: 12/27/2016      PT End of Session - 12/27/16 2114    Visit Number 5   Number of Visits 9   Date for PT Re-Evaluation 02/11/17   Authorization Type Medicare primary/[employer] secondary-GCODE every 10th visit   PT Start Time 0806   PT Stop Time 0848   PT Time Calculation (min) 42 min   Activity Tolerance Patient tolerated treatment well   Behavior During Therapy [facility] for tasks assessed/performed      Past Medical History:  Diagnosis Date  . Abnormal vaginal Pap smear   . Acute pharyngitis 02/25/2013  . Anemia   . Anxiety   . Arthritis   . 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 Chicken pox as a child  . Chronic UTI    sees dr Terance Hart  . Constipation 12/07/2015  . Depression with anxiety 11/02/2009   Qualifier: Diagnosis of  By: [name], LPN, [name]   . Dermatitis 07/17/2012  . Esophageal stricture 1994  . Fibroids   . Foot pain, bilateral 06/19/2012  . GERD (gastroesophageal reflux disease)   . GERD (gastroesophageal reflux disease)   . Hiatal hernia   . Hyperglycemia 08/19/2013  . 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 Hyperlipidemia   . Hypertension   . Infertility, female   . Measles as a child  . Obesity   . Osteoarthritis   . Parkinson disease ([place])   . Plantar fasciitis of left foot 06/19/2012  . Preventative health care 12/19/2015  . Rosacea 10/05/2014  . Swallowing difficulty   . Urinary frequency 02/25/2013  . Visual floaters 05/11/2014    Past Surgical History:  Procedure Laterality Date  . ABDOMINAL HYSTERECTOMY  2006   total  . esophageal     stretching  . laporoscopy    . PARTIAL HIP ARTHROPLASTY  2010   right   . TONSILLECTOMY    . wisdom teeth extracted      There were no vitals filed for this visit.      Subjective Assessment - 12/27/16 0807    Subjective I walked 3 days out of the 5 since I've been here last.  I made sure to get the right walking time in as recommended.   Patient Stated Goals Pt's goals are to make sure she is using walking poles correctly as part of exercise and to be prepared for [facility].   Currently in Pain? No/denies   Pain Onset More than a month ago                         [facility] Adult PT Treatment/Exercise - 12/27/16 0001      Self-Care   Self-Care Other Self-Care Comments   Other Self-Care Comments  Pt expresses some unease regarding general progression of PD among her friends.  She does express hope that she is doing well with her current exercise routine.  She feels progression of walking program chart and her current community PD exercise programs.  Discussed motivation for exercise being that she feels better overall when she exercises.  Pt had questions regarding [employer]il [employer]ique and its relation to her current exercise routine, as well as  concern about being overwhelmed with multiple forms of exercise for PD.  PT discussed benefits of her current program for exercise which is WORKING for her.  Recommended pt continue with her current activities and exercise program.             Balance Exercises - 12/27/16 0809      Balance Exercises: Standing   Standing Eyes Opened Narrow base of support (BOS);Wide (BOA);Head turns;Foam/compliant surface  Head nods, 10 reps each   Standing Eyes Closed Wide (BOA);Narrow base of support (BOS);Head turns;Foam/compliant surface;10 secs  Head nods, 10 reps each Feet apart, 10 sec hold feet togethe   Wall Bumps Hip   Wall Bumps-Hips Eyes opened;Anterior/posterior;Foam/compliant surface;10 reps  2 sets    Marching Limitations Marching on foam x 20 reps intermittent UE support   Heel Raises  Limitations 2 sets of 10 reps standing on foam   Toe Raise Limitations 2 sets of 10 standing on foam   Other Standing Exercises Standing on foam, Forward kicks x 10 reps, then forward step taps x 10 reps  Discussed compliant surf activities relation to real world           PT Education - 12/27/16 0835    Education provided Yes   Education Details HEP-corner balance exercises   Person(s) Educated Patient   Methods Explanation;Demonstration;Handout   Comprehension Verbalized understanding;Returned demonstration;Verbal cues required             PT Long Term Goals - 12/15/16 1050      PT LONG TERM GOAL #1   Title Pt will be independent with progressive HEP for balance, gait activities.  TARGET 01/12/17   Time 5   Period Weeks   Status New     PT LONG TERM GOAL #2   Title Pt will improve MiniBESTest score to at least 24/28 for improved balance/decreased fall risk.   Time 5   Period Weeks   Status New     PT LONG TERM GOAL #3   Title Pt to improve somatosensory and visual system use for balance by at least 10%, as demonstrated on Sensory Organization test.   Time 5   Period Weeks   Status New     PT LONG TERM GOAL #4   Title Pt will improve 3 minute walk test outdoors using bilateral walking poles by at least 100 ft, for improved efficiency on outdoor surfaces.   Time 5   Period Weeks   Status New     PT LONG TERM GOAL #5   Title Pt will verbalize/demonstrate improved gait stamina for outdoors with use of walking poles to at least 25 minutes, for improved posture, gait efficiency and readiness for walking program at upcoming [facility].   Time 5   Period Weeks   Status New               Plan - 12/27/16 2114    Clinical Impression Statement Focused treatment session today on compliant surface/corner balance activities to address somatosensory and visual systems for balance, as noted in Sensory Organization test several visits ago.  Pt requires intermittent  UE support with hip and ankle strategy work on compliant surfaces.  Pt will continue to benefit from further skilled therapy to address balance on compliant surfaces and gait.   Rehab Potential Excellent   PT Frequency 2x / week  1x/wk for 1 week, then    PT Duration 4 weeks   PT Treatment/Interventions ADLs/Self Care Home Management;Functional mobility training;Stair  training;Gait training;Therapeutic activities;Therapeutic exercise;Balance training;Neuromuscular re-education;Patient/family education   PT Next Visit Plan Review HEP provided last visit, continue to work on compliant surfaces with balance and gait activities; work on intervals with walking poles   Consulted and Agree with Plan of Care Patient      Patient will benefit from skilled therapeutic intervention in order to improve the following deficits and impairments:  Abnormal gait, Decreased balance, Decreased mobility, Decreased endurance, Decreased strength, Difficulty walking, Postural dysfunction  Visit Diagnosis: Other abnormalities of gait and mobility  Other symptoms and signs involving the nervous system     Problem List Patient Active Problem List   Diagnosis Date Noted  . Vitamin D deficiency 12/20/2016  . Prediabetes 12/20/2016  . Morbid obesity ([place]) 11/27/2016  . Shortness of breath on exertion 11/27/2016  . Parkinson disease ([place]) 11/27/2016  . Preventative health care 12/19/2015  . Constipation 12/07/2015  . BCC (basal cell carcinoma of skin) 10/05/2014  . Rosacea 10/05/2014  . Parkinson's disease ([place]) 05/11/2014  . Visual floaters 05/11/2014  . Paralysis agitans ([place]) 01/08/2014  . Hyperglycemia 08/19/2013  . Urinary frequency 02/25/2013  . Screening for cervical cancer 07/17/2012  . Foot pain, bilateral 06/19/2012  . 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 Obesity 11/12/2009  . Depression with anxiety 11/02/2009  . 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 Lumbar back pain with  radiculopathy affecting left lower extremity 10/17/2007  . Essential hypertension 07/05/2007  . Osteoarthritis 07/05/2007  . Hyperlipidemia 06/26/2007  . H/O: iron deficiency anemia 05/08/2007  . GERD 05/08/2007    Clevester Helzer W. 12/27/2016, 9:19 PM  Frazier Butt., PT  [facility] [address] [place], [place], [address] Phone: [phone]   Fax:  [phone]  Name: SHAQUETA CASADY MRN: [id number] Date of Birth: [birth year]

## 2016-12-28 DIAGNOSIS — F320 Major depressive disorder, single episode, mild: Secondary | ICD-10-CM | POA: Diagnosis not present

## 2016-12-29 ENCOUNTER — Ambulatory Visit: Payer: Medicare Other | Admitting: Physical Therapy

## 2016-12-29 DIAGNOSIS — R2689 Other abnormalities of gait and mobility: Secondary | ICD-10-CM

## 2016-12-29 DIAGNOSIS — R293 Abnormal posture: Secondary | ICD-10-CM | POA: Diagnosis not present

## 2016-12-29 DIAGNOSIS — R29818 Other symptoms and signs involving the nervous system: Secondary | ICD-10-CM | POA: Diagnosis not present

## 2016-12-29 NOTE — Patient Instructions (Signed)
(  Exercise) Monday Tuesday Wednesday Thursday Friday Saturday Sunday   PWR! Sitting           PWR! Standing           PWR! All 4's           PWR! Supine           PWR! Prone           Corner Balance Ex

## 2016-12-29 NOTE — Therapy (Signed)
[facility] [address] [place], [place], [address] Phone: [phone]   Fax:  [phone]  Physical Therapy Treatment  Patient Details  Name: Kristin [name] MRN: [id number] Date of Birth: September 05, 1950 Referring Provider: Alonza Bogus, DO  Encounter Date: 12/29/2016      PT End of Session - 12/29/16 0904    Visit Number 6   Number of Visits 9   Date for PT Re-Evaluation 02/11/17   Authorization Type Medicare primary/[employer] secondary-GCODE every 10th visit   PT Start Time 0805   PT Stop Time 0849   PT Time Calculation (min) 44 min   Activity Tolerance Patient tolerated treatment well   Behavior During Therapy [facility] for tasks assessed/performed      Past Medical History:  Diagnosis Date  . Abnormal vaginal Pap smear   . Acute pharyngitis 02/25/2013  . Anemia   . Anxiety   . Arthritis   . 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 Chicken pox as a child  . Chronic UTI    sees dr Terance Hart  . Constipation 12/07/2015  . Depression with anxiety 11/02/2009   Qualifier: Diagnosis of  By: [name], LPN, [name]   . Dermatitis 07/17/2012  . Esophageal stricture 1994  . Fibroids   . Foot pain, bilateral 06/19/2012  . GERD (gastroesophageal reflux disease)   . GERD (gastroesophageal reflux disease)   . Hiatal hernia   . Hyperglycemia 08/19/2013  . 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 Hyperlipidemia   . Hypertension   . Infertility, female   . Measles as a child  . Obesity   . Osteoarthritis   . Parkinson disease ([place])   . Plantar fasciitis of left foot 06/19/2012  . Preventative health care 12/19/2015  . Rosacea 10/05/2014  . Swallowing difficulty   . Urinary frequency 02/25/2013  . Visual floaters 05/11/2014    Past Surgical History:  Procedure Laterality Date  . ABDOMINAL HYSTERECTOMY  2006   total  . esophageal     stretching  . laporoscopy    . PARTIAL HIP ARTHROPLASTY  2010   right   . TONSILLECTOMY    . wisdom teeth extracted      There were no vitals filed for this visit.      Subjective Assessment - 12/29/16 0807    Subjective Did the walking yesterday, but didn't do the balance exercises.  I need to work on fitting that into my routine.   Patient Stated Goals Pt's goals are to make sure she is using walking poles correctly as part of exercise and to be prepared for [facility].   Currently in Pain? No/denies   Pain Onset More than a month ago                         [facility] Adult PT Treatment/Exercise - 12/29/16 0001      Ambulation/Gait   Ambulation/Gait Yes   Ambulation/Gait Assistance 6: Modified independent (Device/Increase time)   Ambulation/Gait Assistance Details Gait training and intervals using bilateral walking poles.     Ambulation Distance (Feet) 150 Feet  then 230 using bilateral walking poles   Assistive device None  bilateral walking poles   Gait Pattern Step-through pattern;Decreased step length - right;Decreased step length - left   Ambulation Surface Level;Indoor   Pre-Gait Activities Interval activities using walking poles, x 30-50 ft, multiple reps:  high step marching, high step marching  with attempt to end with hop (Marching-bounding), then Bounding with long reaching forward steps, then side stepping>side step bounding.  Pt needs most cues (has difficulty with leaving ground with hop in forward march direction)             Balance Exercises - 12/29/16 0808      Balance Exercises: Standing   Standing Eyes Opened Narrow base of support (BOS);Wide (BOA);Head turns;Foam/compliant surface  Head nods, 10 reps each   Standing Eyes Closed Wide (BOA);Narrow base of support (BOS);Head turns;Foam/compliant surface;10 secs  Head nods, 10 x, then 10 sec EC standing steady   Tandem Stance --   SLS with Vectors Solid surface;Intermittent upper extremity assist;Other reps (comment)  2 sets 10 reps to 6", 12" step   Wall  Bumps Hip   Wall Bumps-Hips Eyes opened;Anterior/posterior;Foam/compliant surface;15 reps   Rockerboard Anterior/posterior;Head turns;EO  UE alternating lifts, then bilat lifts x 10 reps   Step Ups Forward;6 inch  10 reps -cues for inteniton, "PWR! Up" with step up   Marching Limitations Marching on foam x 20 reps intermittent UE support   Heel Raises Limitations 10 reps standing on foam   Toe Raise Limitations 10 standing on foam   Other Standing Exercises Standing on foam forward kicks x 10, then forward step taps x 10 reps.  On BOSU, in parallel bars with UE support, anterior/posterior weightshifting x 10, lateral weightshifting x 10, then gentle wall squats x 8 reps           PT Education - 12/29/16 0903    Education provided Yes   Education Details Exercise chart to help patient track weekly performance of PWR! MOves and corner balance activities in addition to walking   Person(s) Educated Patient   Methods Explanation;Handout   Comprehension Verbalized understanding            PT Long Term Goals - 12/29/16 0907      PT LONG TERM GOAL #1   Title Pt will be independent with progressive HEP for balance, gait activities.  TARGET 01/12/17   Time 5   Period Weeks   Status On-going     PT LONG TERM GOAL #2   Title Pt will improve MiniBESTest score to at least 24/28 for improved balance/decreased fall risk.   Time 5   Period Weeks   Status On-going     PT LONG TERM GOAL #3   Title Pt to improve somatosensory and visual system use for balance by at least 10%, as demonstrated on Sensory Organization test.   Time 5   Period Weeks   Status On-going     PT LONG TERM GOAL #4   Title Pt will improve 3 minute walk test outdoors using bilateral walking poles by at least 100 ft, for improved efficiency on outdoor surfaces.   Time 5   Period Weeks   Status On-going     PT LONG TERM GOAL #5   Title Pt will verbalize/demonstrate improved gait stamina for outdoors with use of  walking poles to at least 25 minutes, for improved posture, gait efficiency and readiness for walking program at upcoming [facility].   Time 5   Period Weeks   Status On-going               Plan - 12/29/16 0904    Clinical Impression Statement Focused session on review of corner balance exercises and progression of compliant surface balance activities, single limb stance activities and dynamic gait activities with  walking poles.  Pt has increased difficulty with SLS on RLE, but improves with cues to PWR! Up through RLE in stance.  Pt also demo increased difficulty with SLS hop with forward march direction using walking poles.  Pt will continue to benefit from skilled PT to further address balance, compliant surface and gait activities.   Rehab Potential Excellent   PT Frequency 2x / week  1x/wk for 1 week, then   PT Duration 4 weeks   PT Treatment/Interventions ADLs/Self Care Home Management;Functional mobility training;Stair training;Gait training;Therapeutic activities;Therapeutic exercise;Balance training;Neuromuscular re-education;Patient/family education   PT Next Visit Plan Gait activities outdoor with walking poles, including interval work using poles; compliant surface balance and SLS activities   Consulted and Agree with Plan of Care Patient      Patient will benefit from skilled therapeutic intervention in order to improve the following deficits and impairments:  Abnormal gait, Decreased balance, Decreased mobility, Decreased endurance, Decreased strength, Difficulty walking, Postural dysfunction  Visit Diagnosis: Other abnormalities of gait and mobility     Problem List Patient Active Problem List   Diagnosis Date Noted  . Vitamin D deficiency 12/20/2016  . Prediabetes 12/20/2016  . Morbid obesity ([place]) 11/27/2016  . Shortness of breath on exertion 11/27/2016  . Parkinson disease ([place]) 11/27/2016  . Preventative health care 12/19/2015  . Constipation 12/07/2015   . BCC (basal cell carcinoma of skin) 10/05/2014  . Rosacea 10/05/2014  . Parkinson's disease ([place]) 05/11/2014  . Visual floaters 05/11/2014  . Paralysis agitans ([place]) 01/08/2014  . Hyperglycemia 08/19/2013  . Urinary frequency 02/25/2013  . Screening for cervical cancer 07/17/2012  . Foot pain, bilateral 06/19/2012  . 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 Obesity 11/12/2009  . Depression with anxiety 11/02/2009  . 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 Lumbar back pain with radiculopathy affecting left lower extremity 10/17/2007  . Essential hypertension 07/05/2007  . Osteoarthritis 07/05/2007  . Hyperlipidemia 06/26/2007  . H/O: iron deficiency anemia 05/08/2007  . GERD 05/08/2007    Karion Cudd W. 12/29/2016, 9:08 AM  Frazier Butt., PT  [facility] [address] [place], [place], [address] Phone: [phone]   Fax:  [phone]  Name: Kristin [name] MRN: [id number] Date of Birth: Feb 19, 1951

## 2016-12-31 ENCOUNTER — Other Ambulatory Visit: Payer: Self-pay | Admitting: Family Medicine

## 2016-12-31 ENCOUNTER — Encounter: Payer: Self-pay | Admitting: Family Medicine

## 2017-01-03 ENCOUNTER — Ambulatory Visit: Payer: Medicare Other | Attending: Neurology | Admitting: Physical Therapy

## 2017-01-03 DIAGNOSIS — R293 Abnormal posture: Secondary | ICD-10-CM | POA: Insufficient documentation

## 2017-01-03 DIAGNOSIS — R29818 Other symptoms and signs involving the nervous system: Secondary | ICD-10-CM

## 2017-01-03 DIAGNOSIS — R2689 Other abnormalities of gait and mobility: Secondary | ICD-10-CM | POA: Insufficient documentation

## 2017-01-03 NOTE — Therapy (Signed)
[facility] [address] [place], [place], [address] Phone: [phone]   Fax:  [phone]  Physical Therapy Treatment  Patient Details  Name: Kristin [name] MRN: [id number] Date of Birth: 04/23/1951 Referring Provider: Alonza Bogus, DO  Encounter Date: 01/03/2017      PT End of Session - 01/03/17 1219    Visit Number 7   Number of Visits 9   Date for PT Re-Evaluation 02/11/17   Authorization Type Medicare primary/[employer] secondary-GCODE every 10th visit   PT Start Time 0802   PT Stop Time 0845   PT Time Calculation (min) 43 min   Activity Tolerance Patient tolerated treatment well   Behavior During Therapy [facility] for tasks assessed/performed      Past Medical History:  Diagnosis Date  . Abnormal vaginal Pap smear   . Acute pharyngitis 02/25/2013  . Anemia   . Anxiety   . Arthritis   . 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 Chicken pox as a child  . Chronic UTI    sees dr Terance Hart  . Constipation 12/07/2015  . Depression with anxiety 11/02/2009   Qualifier: Diagnosis of  By: [name], LPN, [name]   . Dermatitis 07/17/2012  . Esophageal stricture 1994  . Fibroids   . Foot pain, bilateral 06/19/2012  . GERD (gastroesophageal reflux disease)   . GERD (gastroesophageal reflux disease)   . Hiatal hernia   . Hyperglycemia 08/19/2013  . 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 Hyperlipidemia   . Hypertension   . Infertility, female   . Measles as a child  . Obesity   . Osteoarthritis   . Parkinson disease ([place])   . Plantar fasciitis of left foot 06/19/2012  . Preventative health care 12/19/2015  . Rosacea 10/05/2014  . Swallowing difficulty   . Urinary frequency 02/25/2013  . Visual floaters 05/11/2014    Past Surgical History:  Procedure Laterality Date  . ABDOMINAL HYSTERECTOMY  2006   total  . esophageal     stretching  . laporoscopy    . PARTIAL HIP ARTHROPLASTY  2010   right   . TONSILLECTOMY    . wisdom teeth extracted      There were no vitals filed for this visit.      Subjective Assessment - 01/03/17 0804    Subjective Just trying to find what is the best "hardest" exercise for me.  Used weights at boxing the other night-it was hard.     Patient Stated Goals Pt's goals are to make sure she is using walking poles correctly as part of exercise and to be prepared for [facility].   Currently in Pain? No/denies   Pain Onset More than a month ago                         [facility] Adult PT Treatment/Exercise - 01/03/17 0806      Ambulation/Gait   Ambulation/Gait Yes   Ambulation/Gait Assistance 6: Modified independent (Device/Increase time)   Ambulation/Gait Assistance Details Gait training activities    Ambulation Distance (Feet) 800 Feet  x 3, then  1000 ft outdoors   Assistive device None  Bilateral walking poles   Gait Pattern Step-through pattern;Decreased step length - right;Decreased step length - left   Ambulation Surface Level;Indoor;Unlevel;Outdoor   Gait Comments During gait with walking poles, pt performs interval activities, x 30-50 ft-marching, side stepping with coordination of UEs/poles.  Pt attempts bounding with march-bound, and has difficulty leaving ground (she does consistently go up on toes today), then sidestepping bounding.      High Level Balance   High Level Balance Comments Corner balance activities on foam at end of session:  marching in place x 10 reps, forward kicks x 10 reps, then forward step taps x 10 reps; trunk rotation reaching across body to touch wall x 10 reps.     Exercises   Exercises Other Exercises   Other Exercises  Pre-gait during warm-up and cool-down of gait:  using bilateral walking poles:  PWR! Up x 5 reps, then PWR! Rock x 5 reps, PWR! Twist x 5 reps, then PWR! Step forward x 5 reps, marching in place x 10 reps.  For cool-down, performed 2-3 reps of PWR! UP, Rock, Step forward, side, and back  with increased hold for stretch.                    PT Long Term Goals - 12/29/16 0907      PT LONG TERM GOAL #1   Title Pt will be independent with progressive HEP for balance, gait activities.  TARGET 01/12/17   Time 5   Period Weeks   Status On-going     PT LONG TERM GOAL #2   Title Pt will improve MiniBESTest score to at least 24/28 for improved balance/decreased fall risk.   Time 5   Period Weeks   Status On-going     PT LONG TERM GOAL #3   Title Pt to improve somatosensory and visual system use for balance by at least 10%, as demonstrated on Sensory Organization test.   Time 5   Period Weeks   Status On-going     PT LONG TERM GOAL #4   Title Pt will improve 3 minute walk test outdoors using bilateral walking poles by at least 100 ft, for improved efficiency on outdoor surfaces.   Time 5   Period Weeks   Status On-going     PT LONG TERM GOAL #5   Title Pt will verbalize/demonstrate improved gait stamina for outdoors with use of walking poles to at least 25 minutes, for improved posture, gait efficiency and readiness for walking program at upcoming [facility].   Time 5   Period Weeks   Status On-going               Plan - 01/03/17 1221    Clinical Impression Statement Pt feels she is progressing with walking program using walking poles at home, though she did forget to bring in her chart today.  Gait using walking poles, interval training activities using walking poles performed outdoor on varied surfaces, for at least 24 minutes.  Pt rates RPE as 6/10.  Pt very consistent with coordination of UEs and lower extremities with walking poles; however, pt needs verbal cues reminders for increased step length, increased pace for intervals during gait activities.   Rehab Potential Excellent   PT Frequency 2x / week  1x/wk for 1 week, then   PT Duration 4 weeks   PT Treatment/Interventions ADLs/Self Care Home Management;Functional mobility training;Stair  training;Gait training;Therapeutic activities;Therapeutic exercise;Balance training;Neuromuscular re-education;Patient/family education   PT Next Visit Plan Follow up on chart for walking program progression; interval work using poles, compliant surface balance and SLS activities   Consulted and Agree with Plan of Care Patient      Patient will benefit from skilled therapeutic intervention in order to improve the  following deficits and impairments:  Abnormal gait, Decreased balance, Decreased mobility, Decreased endurance, Decreased strength, Difficulty walking, Postural dysfunction  Visit Diagnosis: Other abnormalities of gait and mobility  Other symptoms and signs involving the nervous system     Problem List Patient Active Problem List   Diagnosis Date Noted  . Vitamin D deficiency 12/20/2016  . Prediabetes 12/20/2016  . Morbid obesity ([place]) 11/27/2016  . Shortness of breath on exertion 11/27/2016  . Parkinson disease ([place]) 11/27/2016  . Preventative health care 12/19/2015  . Constipation 12/07/2015  . BCC (basal cell carcinoma of skin) 10/05/2014  . Rosacea 10/05/2014  . Parkinson's disease ([place]) 05/11/2014  . Visual floaters 05/11/2014  . Paralysis agitans ([place]) 01/08/2014  . Hyperglycemia 08/19/2013  . Urinary frequency 02/25/2013  . Screening for cervical cancer 07/17/2012  . Foot pain, bilateral 06/19/2012  . 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 Obesity 11/12/2009  . Depression with anxiety 11/02/2009  . 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 Lumbar back pain with radiculopathy affecting left lower extremity 10/17/2007  . Essential hypertension 07/05/2007  . Osteoarthritis 07/05/2007  . Hyperlipidemia 06/26/2007  . H/O: iron deficiency anemia 05/08/2007  . GERD 05/08/2007    Kobie Matkins W. 01/03/2017, 12:28 PM Frazier Butt., PT  [facility] [address] [place], [place],  [address] Phone: [phone]   Fax:  [phone]  Name: Kristin [name] MRN: [id number] Date of Birth: Dec 07, 1950

## 2017-01-04 ENCOUNTER — Ambulatory Visit (INDEPENDENT_AMBULATORY_CARE_PROVIDER_SITE_OTHER): Payer: Medicare Other | Admitting: Family Medicine

## 2017-01-04 VITALS — BP 112/74 | HR 78 | Temp 98.4°F | Ht 64.0 in | Wt 238.0 lb

## 2017-01-04 DIAGNOSIS — F320 Major depressive disorder, single episode, mild: Secondary | ICD-10-CM | POA: Diagnosis not present

## 2017-01-04 DIAGNOSIS — K5900 Constipation, unspecified: Secondary | ICD-10-CM | POA: Diagnosis not present

## 2017-01-04 MED ORDER — POLYETHYLENE GLYCOL 3350 17 GM/SCOOP PO POWD: 17.0000 g | Freq: Two times a day (BID) | ORAL | 0 refills | Status: DC | PRN

## 2017-01-04 NOTE — Progress Notes (Signed)
Office: [phone]  /  Fax: [phone]   HPI:   Chief Complaint: OBESITY Kristin [name] is here to discuss her progress with her obesity treatment plan. She is on the keep a food journal with 350 to 500 calories and 30 grams of protein at dinner daily and follow the Category 2 plan and is following her eating plan approximately 80 to 90 % of the time. She states she is exercising 30 to 45 minutes 6 times per week. Kristin [name] continues to do well with weight loss. She is doing well with increasing lean protein and vegetables. She is having to feed her husband healthy foods while he is recovering from stem cell transplant. Her weight is 238 lb (108 kg) today and has had a weight loss of 2 pounds over a period of 2 weeks since her last visit. She has lost 6 lbs since starting treatment with [place].  Constipation Kristin [name] notes constipation for the last few weeks, worse since attempting weight loss. She states BM are less frequent, only has a bowel movement every 5 to 10 days now. She has a long steady history of chronic constipation, worse with Parkinson's treatment.   Wt Readings from Last 500 Encounters:  01/04/17 238 lb (108 kg)  12/20/16 240 lb (108.9 kg)  11/27/16 244 lb (110.7 kg)  10/31/16 253 lb (114.8 kg)  05/04/16 236 lb (107 kg)  03/14/16 231 lb (104.8 kg)  12/07/15 230 lb 4 oz (104.4 kg)  12/01/15 232 lb (105.2 kg)  08/12/15 231 lb (104.8 kg)  05/12/15 230 lb (104.3 kg)  02/09/15 233 lb (105.7 kg)  01/04/15 234 lb 4 oz (106.3 kg)  11/11/14 234 lb (106.1 kg)  10/05/14 237 lb 6.4 oz (107.7 kg)  07/15/14 229 lb 9.6 oz (104.1 kg)  07/06/14 230 lb 3.2 oz (104.4 kg)  05/05/14 226 lb 6.4 oz (102.7 kg)  03/09/14 225 lb (102.1 kg)  01/08/14 227 lb (103 kg)  01/05/14 229 lb 1.9 oz (103.9 kg)  10/03/13 226 lb (102.5 kg)  08/19/13 230 lb 1.3 oz (104.4 kg)  07/10/13 227 lb 0.6 oz (103 kg)  04/07/13 232 lb (105.2 kg)  02/25/13 232 lb 1.9 oz (105.3 kg)  02/10/13 238 lb 0.6 oz (108 kg)  11/28/12 233  lb 1.3 oz (105.7 kg)  10/03/12 239 lb (108.4 kg)  09/19/12 239 lb 3.2 oz (108.5 kg)  09/05/12 237 lb (107.5 kg)  08/22/12 232 lb (105.2 kg)  07/17/12 232 lb 12.8 oz (105.6 kg)  06/19/12 237 lb (107.5 kg)  02/28/12 235 lb (106.6 kg)  02/15/12 237 lb 6.4 oz (107.7 kg)  05/18/11 234 lb (106.1 kg)  09/29/10 (!) 236 lb (107 kg)  02/18/10 (!) 230 lb (104.3 kg)  12/14/09 (!) 238 lb (108 kg)  11/02/09 (!) 236 lb (107 kg)  07/08/09 (!) 226 lb (102.5 kg)  01/01/09 (!) 228 lb (103.4 kg)  12/03/08 (!) 229 lb (103.9 kg)  11/25/08 (!) 231 lb 6.1 oz (105 kg)  11/03/08 (!) 228 lb (103.4 kg)  08/06/08 (!) 229 lb (103.9 kg)  10/17/07 (!) 237 lb (107.5 kg)  07/05/07 (!) 238 lb (108 kg)     ALLERGIES: Allergies  Allergen Reactions  . Bactrim [Sulfamethoxazole-Trimethoprim] Other (See Comments)    Oral ulcers and rash  . Penicillins Hives    MEDICATIONS: Current Outpatient Prescriptions on File Prior to Visit  Medication Sig Dispense Refill  . ALPRAZolam (XANAX) 0.5 MG tablet Take 1 tablet (0.5 mg total) by mouth as  needed for anxiety or sleep. 30 tablet 1  . carbidopa-levodopa (SINEMET IR) 25-100 MG tablet take 1 tablet by mouth three times a day (Patient not taking: Reported on 12/20/2016) 90 tablet 5  . carbidopa-levodopa (SINEMET IR) 25-100 MG tablet take 1 tablet by mouth three times a day (Patient not taking: Reported on 12/13/2016) 90 tablet 5  . DULoxetine (CYMBALTA) 30 MG capsule take 1 capsule by mouth once daily 90 capsule 1  . escitalopram (LEXAPRO) 20 MG tablet take 1 tablet by mouth once daily 90 tablet 2  . Krill Oil CAPS 1 krill oil caps daily, MegaRed caps by Schiff (Patient not taking: Reported on 12/13/2016)    . omeprazole (PRILOSEC) 20 MG capsule take 1 capsule by mouth once daily 90 capsule 1  . pramipexole (MIRAPEX) 0.5 MG tablet TAKE 2 TABLETS BY MOUTH IN THE MORNING 1 TABLET IN THE AFTERNOON AND 1 TABLET IN THE EVENING 360 tablet 3  . rosuvastatin (CRESTOR) 10 MG tablet  TAKE 1 TABLET BY MOUTH ON MONDAYS, WEDNESDAY, AND FRIDAYS 90 tablet 2  . telmisartan-hydrochlorothiazide (MICARDIS HCT) 80-12.5 MG tablet TAKE 1 TABLET BY MOUTH DAILY (Patient taking differently: Take 0.5 tablets by mouth daily. ) 90 tablet 1  . Vitamin D, Ergocalciferol, (DRISDOL) 50000 units CAPS capsule Take 1 capsule (50,000 Units total) by mouth every 7 (seven) days. 4 capsule 0   No current facility-administered medications on file prior to visit.     PAST MEDICAL HISTORY: Past Medical History:  Diagnosis Date  . Abnormal vaginal Pap smear   . Acute pharyngitis 02/25/2013  . Anemia   . Anxiety   . Arthritis   . 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 Chicken pox as a child  . Chronic UTI    sees dr Terance Hart  . Constipation 12/07/2015  . Depression with anxiety 11/02/2009   Qualifier: Diagnosis of  By: [name], LPN, [name]   . Dermatitis 07/17/2012  . Esophageal stricture 1994  . Fibroids   . Foot pain, bilateral 06/19/2012  . GERD (gastroesophageal reflux disease)   . GERD (gastroesophageal reflux disease)   . Hiatal hernia   . Hyperglycemia 08/19/2013  . 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 Hyperlipidemia   . Hypertension   . Infertility, female   . Measles as a child  . Obesity   . Osteoarthritis   . Parkinson disease ([place])   . Plantar fasciitis of left foot 06/19/2012  . Preventative health care 12/19/2015  . Rosacea 10/05/2014  . Swallowing difficulty   . Urinary frequency 02/25/2013  . Visual floaters 05/11/2014    PAST SURGICAL HISTORY: Past Surgical History:  Procedure Laterality Date  . ABDOMINAL HYSTERECTOMY  2006   total  . esophageal     stretching  . laporoscopy    . PARTIAL HIP ARTHROPLASTY  2010   right  . TONSILLECTOMY    . wisdom teeth extracted      SOCIAL HISTORY: Social History  Substance Use Topics  . Smoking status: Never Smoker  . Smokeless tobacco: Never Used  . Alcohol use 2.4 oz/week    4 Glasses of wine per  week     Comment: 4 glasses of wine weekly    FAMILY HISTORY: Family History  Problem Relation Age of Onset  . Cancer Mother     breast  . Other Mother     arrythmia  . Mental illness Mother     bipolar  . Hyperlipidemia Mother   .  Thyroid disease Mother   . Depression Mother   . Bipolar disorder Mother   . Heart disease Father   . Arthritis Father     rheumatoid  . Hypertension Father   . Hyperlipidemia Father   . Depression Sister   . Mental illness Sister     bipolar  . Parkinson's disease Sister   . Arthritis Sister   . Colon cancer Neg Hx   . Esophageal cancer Neg Hx   . Rectal cancer Neg Hx   . Stomach cancer Neg Hx     ROS: Review of Systems  Constitutional: Positive for weight loss.  Gastrointestinal: Positive for constipation.    PHYSICAL EXAM: Blood pressure 112/74, pulse 78, temperature 98.4 F (36.9 C), temperature source Oral, height 5\' 4"  (1.626 m), weight 238 lb (108 kg), SpO2 99 %. Body mass index is 40.85 kg/m. Physical Exam  Constitutional: She is oriented to person, place, and time. She appears well-developed and well-nourished.  Cardiovascular: Normal rate.   Pulmonary/Chest: Effort normal.  Musculoskeletal: Normal range of motion.  Neurological: She is oriented to person, place, and time.  Skin: Skin is warm and dry.  Psychiatric: She has a normal mood and affect. Her behavior is normal.  Vitals reviewed.   RECENT LABS AND TESTS: BMET    Component Value Date/Time   NA 141 11/27/2016 1123   K 4.4 11/27/2016 1123   CL 102 11/27/2016 1123   CO2 26 11/27/2016 1123   GLUCOSE 87 11/27/2016 1123   GLUCOSE 97 11/03/2016 1030   BUN 13 11/27/2016 1123   CREATININE 0.75 11/27/2016 1123   CREATININE 0.71 01/04/2015 1046   CALCIUM 9.2 11/27/2016 1123   GFRNONAA 83 11/27/2016 1123   GFRNONAA >89 01/04/2015 1046   GFRAA 96 11/27/2016 1123   GFRAA >89 01/04/2015 1046   Lab Results  Component Value Date   HGBA1C 6.1 (H) 11/27/2016    HGBA1C 6.2 11/03/2016   HGBA1C 6.0 05/04/2016   HGBA1C 6.1 12/07/2015   HGBA1C 5.9 01/04/2015   Lab Results  Component Value Date   INSULIN 18.0 11/27/2016   CBC    Component Value Date/Time   WBC 5.5 11/27/2016 1123   WBC 6.1 11/03/2016 1030   RBC 4.31 11/27/2016 1123   RBC 4.31 11/03/2016 1030   HGB 11.9 (L) 11/03/2016 1030   HCT 38.3 11/27/2016 1123   PLT 243.0 11/03/2016 1030   MCV 89 11/27/2016 1123   MCH 28.3 11/27/2016 1123   MCH 31.6 04/07/2013 0923   MCHC 31.9 11/27/2016 1123   MCHC 32.1 11/03/2016 1030   RDW 15.3 11/27/2016 1123   LYMPHSABS 1.8 11/27/2016 1123   EOSABS 0.1 11/27/2016 1123   BASOSABS 0.0 11/27/2016 1123   Iron/TIBC/Ferritin/ %Sat No results found for: IRON, TIBC, FERRITIN, IRONPCTSAT Lipid Panel     Component Value Date/Time   CHOL 180 11/27/2016 1123   TRIG 175 (H) 11/27/2016 1123   HDL 57 11/27/2016 1123   CHOLHDL 3 11/03/2016 1030   VLDL 20.6 11/03/2016 1030   LDLCALC 88 11/27/2016 1123   LDLDIRECT 161.5 10/07/2008 0944   Hepatic Function Panel     Component Value Date/Time   PROT 6.9 11/27/2016 1123   ALBUMIN 4.3 11/27/2016 1123   AST 23 11/27/2016 1123   ALT 9 11/27/2016 1123   ALKPHOS 79 11/27/2016 1123   BILITOT 0.4 11/27/2016 1123   BILIDIR 0.1 05/05/2014 0948   IBILI 0.4 04/07/2013 0923      Component Value Date/Time   TSH  1.480 11/27/2016 1123   TSH 0.87 11/03/2016 1030   TSH 1.13 05/04/2016 0935    ASSESSMENT AND PLAN: Constipation, unspecified constipation type - Plan: polyethylene glycol powder (GLYCOLAX/MIRALAX) powder  Morbid obesity (HCC) - BMI 40.9  PLAN:  Constipation Kristin [name] was informed decrease bowel movement frequency is normal while losing weight, but stools should not be hard or painful. She was advised to increase her H20 intake and work on increasing her fiber intake. High fiber foods were discussed today. Kristin [name] agrees to start to take OTC Miralax 17 grams 1 to 2 times per day and will follow up with  our clinic in 2 weeks.  Obesity Kristin [name] is currently in the action stage of change. As such, her goal is to continue with weight loss efforts She has agreed to follow the Category 2 plan Kristin [name] has been instructed to work up to a goal of 150 minutes of combined cardio and strengthening exercise per week or continue spin, boxing and yoga for 30 to 45 minutes 6 times per week for weight loss and overall health benefits. We discussed the following Behavioral Modification Stratagies today: increasing lean protein intake, work on meal planning and easy cooking plans and dealing with family or coworker sabotage  Kristin [name] has agreed to follow up with our clinic in 2 weeks. She was informed of the importance of frequent follow up visits to maximize her success with intensive lifestyle modifications for her multiple health conditions.  I, Doreene Nest, am acting as scribe for Dennard Nip, MD  I have reviewed the above documentation for accuracy and completeness, and I agree with the above. -Dennard Nip, MD

## 2017-01-05 ENCOUNTER — Ambulatory Visit: Payer: Medicare Other | Admitting: Physical Therapy

## 2017-01-05 DIAGNOSIS — R2689 Other abnormalities of gait and mobility: Secondary | ICD-10-CM

## 2017-01-05 DIAGNOSIS — R293 Abnormal posture: Secondary | ICD-10-CM | POA: Diagnosis not present

## 2017-01-05 DIAGNOSIS — R29818 Other symptoms and signs involving the nervous system: Secondary | ICD-10-CM

## 2017-01-05 NOTE — Therapy (Signed)
[facility] [address] [place], [place], [address] Phone: [phone]   Fax:  [phone]  Physical Therapy Treatment  Patient Details  Name: Kristin [name] MRN: [id number] Date of Birth: Jan 11, 1951 Referring Provider: Alonza Bogus, DO  Encounter Date: 01/05/2017      PT End of Session - 01/05/17 0901    Visit Number 8   Number of Visits 9   Date for PT Re-Evaluation 02/11/17   Authorization Type Medicare primary/[employer] secondary-GCODE every 10th visit   PT Start Time 0807   PT Stop Time 0851   PT Time Calculation (min) 44 min   Activity Tolerance Patient tolerated treatment well   Behavior During Therapy [facility] for tasks assessed/performed      Past Medical History:  Diagnosis Date  . Abnormal vaginal Pap smear   . Acute pharyngitis 02/25/2013  . Anemia   . Anxiety   . Arthritis   . 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 Chicken pox as a child  . Chronic UTI    sees dr Terance Hart  . Constipation 12/07/2015  . Depression with anxiety 11/02/2009   Qualifier: Diagnosis of  By: [name], LPN, [name]   . Dermatitis 07/17/2012  . Esophageal stricture 1994  . Fibroids   . Foot pain, bilateral 06/19/2012  . GERD (gastroesophageal reflux disease)   . GERD (gastroesophageal reflux disease)   . Hiatal hernia   . Hyperglycemia 08/19/2013  . 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 Hyperlipidemia   . Hypertension   . Infertility, female   . Measles as a child  . Obesity   . Osteoarthritis   . Parkinson disease ([place])   . Plantar fasciitis of left foot 06/19/2012  . Preventative health care 12/19/2015  . Rosacea 10/05/2014  . Swallowing difficulty   . Urinary frequency 02/25/2013  . Visual floaters 05/11/2014    Past Surgical History:  Procedure Laterality Date  . ABDOMINAL HYSTERECTOMY  2006   total  . esophageal     stretching  . laporoscopy    . PARTIAL HIP ARTHROPLASTY  2010   right   . TONSILLECTOMY    . wisdom teeth extracted      There were no vitals filed for this visit.      Subjective Assessment - 01/05/17 0811    Subjective Nothing new since last visit.  Have to make a few changes in appts.  Walking between 35-40 minutes with a combination of poles/no poles.   Patient Stated Goals Pt's goals are to make sure she is using walking poles correctly as part of exercise and to be prepared for [facility].   Currently in Pain? No/denies   Pain Onset More than a month ago                         [facility] Adult PT Treatment/Exercise - 01/05/17 0001      Ambulation/Gait   Ambulation/Gait Yes   Ambulation/Gait Assistance 6: Modified independent (Device/Increase time)   Ambulation/Gait Assistance Details Gait training with bilateral walking poles, 200 ft at comfortable pace, 400 ft at increased pace-increased step length, foot clearance, cues to push through poles for increased intensity of movement patterns   Ambulation Distance (Feet) 600 Feet  outdoors, then 200 ft indoors   Assistive device --  bilateral walking poles   Gait Pattern Step-through pattern;Decreased step length - right;Decreased step length - left  Ambulation Surface Level;Indoor;Unlevel;Outdoor   [employer] Activities Briefly reviewed walking program tracking chart-pt walking between 35-40 minutes combo of poles/no poles.  Advised patient to work on consistency and ability to increase time of walking.             Balance Exercises - 01/05/17 0813      Balance Exercises: Standing   Standing Eyes Opened Narrow base of support (BOS);Wide (BOA);Head turns;5 reps;Foam/compliant surface  Head nods   Standing Eyes Closed Narrow base of support (BOS);Wide (BOA);Head turns;Foam/compliant surface;5 reps  Head nods, then EC head steady x 10 seconds   Tandem Stance Eyes open;Foam/compliant surface;5 reps;Intermittent upper extremity support  Head nods, head turns x 5, EC x 10 sec    SLS with Vectors Solid surface;Other reps (comment)  step taps forward>step back and weightshift 15 reps each   Rockerboard Anterior/posterior;EO  Hip/ankle strategy work, added UE lifts, 10-15 reps each   Step Ups Forward;6 inch  then step back and weightshift x 10 reps   Balance Beam PWR! Moves performed on balance beam:  PWR! Up x 10, PWR! Rock x 10 each side, PWR! Twist x 10 reps each side, PWR! STep x 10 each side; Step back and weigthshift x 10 reps, then sidestepping on balance beam length of counter, 4 reps with intermittent UE support   Marching Limitations Marching on foam x 15 reps each side, forward kicks 15 reps each side, forward step taps x 15 reps each side                PT Long Term Goals - 12/29/16 0907      PT LONG TERM GOAL #1   Title Pt will be independent with progressive HEP for balance, gait activities.  TARGET 01/12/17   Time 5   Period Weeks   Status On-going     PT LONG TERM GOAL #2   Title Pt will improve MiniBESTest score to at least 24/28 for improved balance/decreased fall risk.   Time 5   Period Weeks   Status On-going     PT LONG TERM GOAL #3   Title Pt to improve somatosensory and visual system use for balance by at least 10%, as demonstrated on Sensory Organization test.   Time 5   Period Weeks   Status On-going     PT LONG TERM GOAL #4   Title Pt will improve 3 minute walk test outdoors using bilateral walking poles by at least 100 ft, for improved efficiency on outdoor surfaces.   Time 5   Period Weeks   Status On-going     PT LONG TERM GOAL #5   Title Pt will verbalize/demonstrate improved gait stamina for outdoors with use of walking poles to at least 25 minutes, for improved posture, gait efficiency and readiness for walking program at upcoming [facility].   Time 5   Period Weeks   Status On-going               Plan - 01/05/17 0901    Clinical Impression Statement Treatment session focused today on compliant  surface and SLS activities.  Pt requires intermittent UE support with compliant surface activities, especially rockerboard.  Pt is progressing with walking pole program, just more slowly than she expected.  Pt is progressing towards LTGs.   Rehab Potential Excellent   PT Frequency 2x / week  1x/wk for 1 week, then   PT Duration 4 weeks   PT Treatment/Interventions ADLs/Self Care Home Management;Functional mobility training;Stair  training;Gait training;Therapeutic activities;Therapeutic exercise;Balance training;Neuromuscular re-education;Patient/family education   PT Next Visit Plan Will need to check goals next visit and likely renew for 1-2 weeks; interval work using poles, continue compliant surface and SLS activities.   Consulted and Agree with Plan of Care Patient      Patient will benefit from skilled therapeutic intervention in order to improve the following deficits and impairments:  Abnormal gait, Decreased balance, Decreased mobility, Decreased endurance, Decreased strength, Difficulty walking, Postural dysfunction  Visit Diagnosis: Other abnormalities of gait and mobility  Other symptoms and signs involving the nervous system     Problem List Patient Active Problem List   Diagnosis Date Noted  . Vitamin D deficiency 12/20/2016  . Prediabetes 12/20/2016  . Morbid obesity ([place]) 11/27/2016  . Shortness of breath on exertion 11/27/2016  . Parkinson disease ([place]) 11/27/2016  . Preventative health care 12/19/2015  . Constipation 12/07/2015  . BCC (basal cell carcinoma of skin) 10/05/2014  . Rosacea 10/05/2014  . Parkinson's disease ([place]) 05/11/2014  . Visual floaters 05/11/2014  . Paralysis agitans ([place]) 01/08/2014  . Hyperglycemia 08/19/2013  . Urinary frequency 02/25/2013  . Screening for cervical cancer 07/17/2012  . Foot pain, bilateral 06/19/2012  . 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 Obesity 11/12/2009  . Depression with anxiety 11/02/2009  . 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 Lumbar back pain with radiculopathy affecting left lower extremity 10/17/2007  . Essential hypertension 07/05/2007  . Osteoarthritis 07/05/2007  . Hyperlipidemia 06/26/2007  . H/O: iron deficiency anemia 05/08/2007  . GERD 05/08/2007    Marshal Schrecengost W. 01/05/2017, 9:04 AM  Frazier Butt., PT  [facility] [address] [place], [place], [address] Phone: [phone]   Fax:  [phone]  Name: Kristin [name] MRN: [id number] Date of Birth: March 07, 1951

## 2017-01-06 ENCOUNTER — Encounter: Payer: Self-pay | Admitting: Family Medicine

## 2017-01-06 ENCOUNTER — Ambulatory Visit (INDEPENDENT_AMBULATORY_CARE_PROVIDER_SITE_OTHER): Payer: Medicare Other | Admitting: Family Medicine

## 2017-01-06 VITALS — BP 114/70 | HR 69 | Temp 98.4°F | Wt 242.2 lb

## 2017-01-06 DIAGNOSIS — K5900 Constipation, unspecified: Secondary | ICD-10-CM | POA: Diagnosis not present

## 2017-01-06 NOTE — Patient Instructions (Signed)
Mira Lax 1-2 times daily through the weekend.  Use an enema, which is available in the "laxative" section of the pharmacy.  Stop using the over the counter supplements if you stop passing gas or stop having stools.   Seek care if you start having fevers, bleeding, sudden nausea and vomiting.

## 2017-01-06 NOTE — Progress Notes (Signed)
Chief Complaint  Patient presents with  . Constipation    x 2 weeks--some loose watery stools ladst night...denies abd pain or bloated....pt has tried OTC Senna, Mirilax, suppository and colon cleanse...took magnesium citrate yesterday    Subjective: Patient is a 66 y.o. female here for constipation.  Pt has a hx of constipation, has not had good BM's over the past 2 weeks. Last night, she started having loose stools. She has tried Senna, Milk of Mag, Miralax, and a suppository with little relief. She is not having any pain, N/V, fevers, bleeding or bloating. She is still passing gas.    ROS: GI: As noted in HPI  Family History  Problem Relation Age of Onset  . Cancer Mother     breast  . Other Mother     arrythmia  . Mental illness Mother     bipolar  . Hyperlipidemia Mother   . Thyroid disease Mother   . Depression Mother   . Bipolar disorder Mother   . Heart disease Father   . Arthritis Father     rheumatoid  . Hypertension Father   . Hyperlipidemia Father   . Depression Sister   . Mental illness Sister     bipolar  . Parkinson's disease Sister   . Arthritis Sister   . Colon cancer Neg Hx   . Esophageal cancer Neg Hx   . Rectal cancer Neg Hx   . Stomach cancer Neg Hx    Past Medical History:  Diagnosis Date  . Abnormal vaginal Pap smear   . Acute pharyngitis 02/25/2013  . Anemia   . Anxiety   . Arthritis   . 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 Chicken pox as a child  . Chronic UTI    sees dr Terance Hart  . Constipation 12/07/2015  . Depression with anxiety 11/02/2009   Qualifier: Diagnosis of  By: [name], LPN, [name]   . Dermatitis 07/17/2012  . Esophageal stricture 1994  . Fibroids   . Foot pain, bilateral 06/19/2012  . GERD (gastroesophageal reflux disease)   . GERD (gastroesophageal reflux disease)   . Hiatal hernia   . Hyperglycemia 08/19/2013  . 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 Hyperlipidemia   . Hypertension   .  Infertility, female   . Measles as a child  . Obesity   . Osteoarthritis   . Parkinson disease ([place])   . Plantar fasciitis of left foot 06/19/2012  . Preventative health care 12/19/2015  . Rosacea 10/05/2014  . Swallowing difficulty   . Urinary frequency 02/25/2013  . Visual floaters 05/11/2014   Allergies  Allergen Reactions  . Bactrim [Sulfamethoxazole-Trimethoprim] Other (See Comments)    Oral ulcers and rash  . Penicillins Hives    Current Outpatient Prescriptions:  .  ALPRAZolam (XANAX) 0.5 MG tablet, Take 1 tablet (0.5 mg total) by mouth as needed for anxiety or sleep., Disp: 30 tablet, Rfl: 1 .  DULoxetine (CYMBALTA) 30 MG capsule, take 1 capsule by mouth once daily, Disp: 90 capsule, Rfl: 1 .  escitalopram (LEXAPRO) 20 MG tablet, take 1 tablet by mouth once daily, Disp: 90 tablet, Rfl: 2 .  omeprazole (PRILOSEC) 20 MG capsule, take 1 capsule by mouth once daily, Disp: 90 capsule, Rfl: 1 .  polyethylene glycol powder (GLYCOLAX/MIRALAX) powder, Take 17 g by mouth 2 (two) times daily as needed (1 to 2 times per day, as needed)., Disp: 850 g, Rfl: 0 .  pramipexole (MIRAPEX) 0.5 MG tablet,  TAKE 2 TABLETS BY MOUTH IN THE MORNING 1 TABLET IN THE AFTERNOON AND 1 TABLET IN THE EVENING, Disp: 360 tablet, Rfl: 3 .  rosuvastatin (CRESTOR) 10 MG tablet, TAKE 1 TABLET BY MOUTH ON MONDAYS, WEDNESDAY, AND FRIDAYS, Disp: 90 tablet, Rfl: 2 .  telmisartan-hydrochlorothiazide (MICARDIS HCT) 80-12.5 MG tablet, TAKE 1 TABLET BY MOUTH DAILY (Patient taking differently: Take 0.5 tablets by mouth daily. ), Disp: 90 tablet, Rfl: 1 .  Vitamin D, Ergocalciferol, (DRISDOL) 50000 units CAPS capsule, Take 1 capsule (50,000 Units total) by mouth every 7 (seven) days., Disp: 4 capsule, Rfl: 0 .  carbidopa-levodopa (SINEMET IR) 25-100 MG tablet, take 1 tablet by mouth three times a day (Patient not taking: Reported on 12/13/2016), Disp: 90 tablet, Rfl: 5 .  Krill Oil CAPS, 1 krill oil caps daily, MegaRed caps by Schiff  (Patient not taking: Reported on 12/13/2016), Disp: , Rfl:   Objective: BP 114/70   Pulse 69   Temp 98.4 F (36.9 C) (Oral)   Wt 242 lb 4 oz (109.9 kg)   SpO2 96%   BMI 41.58 kg/m  General: Awake, appears stated age HEENT: MMM, EOMi Heart: RRR, no murmurs Lungs: CTAB, no rales, wheezes or rhonchi. No accessory muscle use Abd: BS+, soft, NT, ND, no masses or organomegaly Psych: Age appropriate judgment and insight, normal affect and mood  Assessment and Plan: Constipation, unspecified constipation type  Miralax 1-2 times daily over next 3 days. Recommended using an enema.  No s/s of obstruction or anything sinister. F/u with reg PCP Mon if no improvement. I did state she may need more than 1 enema. Seek immediate care if experiencing N/V, fevers, bleeding or any new symptoms. The patient voiced understanding and agreement to the plan.  [place], DO 01/06/17  11:21 AM

## 2017-01-08 ENCOUNTER — Telehealth: Payer: Self-pay | Admitting: Family Medicine

## 2017-01-08 NOTE — Telephone Encounter (Signed)
She might need to take a couple enemas given how long it has been. Another option is to have her come in and we could do an X-ray of her stomach to see how much stool is there. TY.

## 2017-01-08 NOTE — Telephone Encounter (Signed)
Patient called in she is still having a little loose stool but has not had a good stool since talking a the meds to help with constipation. She seen dr Nani Ravens Saturday but still has not had any improvement. She is ask what else can she do at this point.  Please advise  [phone]

## 2017-01-09 ENCOUNTER — Ambulatory Visit (INDEPENDENT_AMBULATORY_CARE_PROVIDER_SITE_OTHER): Payer: Medicare Other | Admitting: Family Medicine

## 2017-01-09 ENCOUNTER — Ambulatory Visit (HOSPITAL_BASED_OUTPATIENT_CLINIC_OR_DEPARTMENT_OTHER)
Admission: RE | Admit: 2017-01-09 | Discharge: 2017-01-09 | Disposition: A | Discharge status: Home / Self Care | Payer: Medicare Other | Source: Ambulatory Visit | Attending: Family Medicine | Admitting: Family Medicine

## 2017-01-09 ENCOUNTER — Encounter: Payer: Self-pay | Admitting: Family Medicine

## 2017-01-09 VITALS — BP 118/70 | HR 74 | Temp 98.3°F | Resp 18 | Wt 240.4 lb

## 2017-01-09 DIAGNOSIS — M5136 Other intervertebral disc degeneration, lumbar region: Secondary | ICD-10-CM | POA: Diagnosis not present

## 2017-01-09 DIAGNOSIS — K5900 Constipation, unspecified: Secondary | ICD-10-CM | POA: Insufficient documentation

## 2017-01-09 DIAGNOSIS — K219 Gastro-esophageal reflux disease without esophagitis: Secondary | ICD-10-CM | POA: Diagnosis not present

## 2017-01-09 DIAGNOSIS — E669 Obesity, unspecified: Secondary | ICD-10-CM

## 2017-01-09 NOTE — Assessment & Plan Note (Signed)
Continues to follow with neurology, no recent changes to meds.  

## 2017-01-09 NOTE — Assessment & Plan Note (Signed)
Avoid offending foods, take probiotics. Do not eat large meals in late evening and consider raising head of bed.  

## 2017-01-09 NOTE — Assessment & Plan Note (Addendum)
Struggling for a couple of weeks now. Unable to move bowels well now has liquid stool only. Encouraged increased hydration and fiber in diet. Daily probiotics. If bowels not moving can use MOM 2 tbls po in 4 oz of warm prune juice by mouth every 2-3 days. If no results then repeat in 4 hours with  Dulcolax suppository pr, may repeat again in 4 more hours as needed. Seek care if symptoms worsen. Consider daily Miralax and/or Dulcolax if symptoms persist. NOW probiotic daily, benefiber twice. She failed the MOM/prune juice/suppository option. Will try Mag citrate with suppository then start new daily regimen

## 2017-01-09 NOTE — Progress Notes (Signed)
Subjective:  I acted as a Education administrator for Dr. Charlett Blake. [place], [place]   Patient ID: Kristin [name], female    DOB: 07/04/1951, 66 y.o.   MRN: [id number]  Chief Complaint  Patient presents with  . Constipation    Constipation  This is a new problem. The current episode started 1 to 4 weeks ago. The problem has been gradually worsening since onset. Pertinent negatives include no back pain, fever or vomiting.    Patient is in today for an acute visit for constipation. She denies abdominal pain, she states when she goes to the restroom now she can feel the blockage, and only liquid leaks out. She will go leave today with an at home regiment, if this does not clear up she will be referred to gastro. No anorexia, n/v/bloody or tarry stool. Notes this worsened when she added more protein to diet. No other changes noted. Denies CP/palp/SOB/HA/congestion/fevers or GU c/o. Taking meds as prescribed Patient Care Team: Mosie Lukes, MD as PCP - General (Family Medicine) Tat, Eustace Quail, DO as Consulting Physician (Neurology)   Past Medical History:  Diagnosis Date  . Abnormal vaginal Pap smear   . Acute pharyngitis 02/25/2013  . Anemia   . Anxiety   . Arthritis   . 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 Chicken pox as a child  . Chronic UTI    sees dr Terance Hart  . Constipation 12/07/2015  . Depression with anxiety 11/02/2009   Qualifier: Diagnosis of  By: [name], LPN, [name]   . Dermatitis 07/17/2012  . Esophageal stricture 1994  . Fibroids   . Foot pain, bilateral 06/19/2012  . GERD (gastroesophageal reflux disease)   . GERD (gastroesophageal reflux disease)   . Hiatal hernia   . Hyperglycemia 08/19/2013  . 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 Hyperlipidemia   . Hypertension   . Infertility, female   . Measles as a child  . Obesity   . Osteoarthritis   . Parkinson disease ([place])   . Plantar fasciitis of left foot 06/19/2012  . Preventative health care 12/19/2015  .  Rosacea 10/05/2014  . Swallowing difficulty   . Urinary frequency 02/25/2013  . Visual floaters 05/11/2014    Past Surgical History:  Procedure Laterality Date  . ABDOMINAL HYSTERECTOMY  2006   total  . esophageal     stretching  . laporoscopy    . PARTIAL HIP ARTHROPLASTY  2010   right  . TONSILLECTOMY    . wisdom teeth extracted      Family History  Problem Relation Age of Onset  . Cancer Mother     breast  . Other Mother     arrythmia  . Mental illness Mother     bipolar  . Hyperlipidemia Mother   . Thyroid disease Mother   . Depression Mother   . Bipolar disorder Mother   . Heart disease Father   . Arthritis Father     rheumatoid  . Hypertension Father   . Hyperlipidemia Father   . Depression Sister   . Mental illness Sister     bipolar  . Parkinson's disease Sister   . Arthritis Sister   . Colon cancer Neg Hx   . Esophageal cancer Neg Hx   . Rectal cancer Neg Hx   . Stomach cancer Neg Hx     Social History   Social History  . Marital status: Married    Spouse name: N/A  .  Number of children: N/A  . Years of education: N/A   Occupational History  . Retired Teacher, music    Social History Main Topics  . Smoking status: Never Smoker  . Smokeless tobacco: Never Used  . Alcohol use 2.4 oz/week    4 Glasses of wine per week     Comment: 4 glasses of wine weekly  . Drug use: No  . Sexual activity: Yes    Partners: Male   Other Topics Concern  . Not on file   Social History Narrative   Lives with husband, no major dietary restrictions, retired from teaching      Husband dx with cancer MDS June 2017.   Currently in remission. (11/03/16 pc)          Outpatient Medications Prior to Visit  Medication Sig Dispense Refill  . ALPRAZolam (XANAX) 0.5 MG tablet Take 1 tablet (0.5 mg total) by mouth as needed for anxiety or sleep. 30 tablet 1  . DULoxetine (CYMBALTA) 30 MG capsule take 1 capsule by mouth once daily 90 capsule 1  . escitalopram  (LEXAPRO) 20 MG tablet take 1 tablet by mouth once daily 90 tablet 2  . omeprazole (PRILOSEC) 20 MG capsule take 1 capsule by mouth once daily 90 capsule 1  . polyethylene glycol powder (GLYCOLAX/MIRALAX) powder Take 17 g by mouth 2 (two) times daily as needed (1 to 2 times per day, as needed). 850 g 0  . pramipexole (MIRAPEX) 0.5 MG tablet TAKE 2 TABLETS BY MOUTH IN THE MORNING 1 TABLET IN THE AFTERNOON AND 1 TABLET IN THE EVENING 360 tablet 3  . rosuvastatin (CRESTOR) 10 MG tablet TAKE 1 TABLET BY MOUTH ON MONDAYS, WEDNESDAY, AND FRIDAYS 90 tablet 2  . telmisartan-hydrochlorothiazide (MICARDIS HCT) 80-12.5 MG tablet TAKE 1 TABLET BY MOUTH DAILY (Patient taking differently: Take 0.5 tablets by mouth daily. ) 90 tablet 1  . Vitamin D, Ergocalciferol, (DRISDOL) 50000 units CAPS capsule Take 1 capsule (50,000 Units total) by mouth every 7 (seven) days. 4 capsule 0  . carbidopa-levodopa (SINEMET IR) 25-100 MG tablet take 1 tablet by mouth three times a day (Patient not taking: Reported on 12/13/2016) 90 tablet 5  . Krill Oil CAPS 1 krill oil caps daily, MegaRed caps by Schiff (Patient not taking: Reported on 12/13/2016)     No facility-administered medications prior to visit.     Allergies  Allergen Reactions  . Bactrim [Sulfamethoxazole-Trimethoprim] Other (See Comments)    Oral ulcers and rash  . Penicillins Hives    Review of Systems  Constitutional: Negative for fever and malaise/fatigue.  HENT: Negative for congestion.   Eyes: Negative for blurred vision.  Respiratory: Negative for cough and shortness of breath.   Cardiovascular: Negative for chest pain, palpitations and leg swelling.  Gastrointestinal: Positive for constipation. Negative for vomiting.  Musculoskeletal: Negative for back pain.  Skin: Negative for rash.  Neurological: Negative for loss of consciousness and headaches.       Objective:    Physical Exam  Constitutional: She is oriented to person, place, and time. She  appears well-developed and well-nourished. No distress.  HENT:  Head: Normocephalic and atraumatic.  Eyes: Conjunctivae are normal.  Neck: Normal range of motion. No thyromegaly present.  Cardiovascular: Normal rate and regular rhythm.   Pulmonary/Chest: Effort normal and breath sounds normal. She has no wheezes.  Abdominal: Soft. Bowel sounds are normal. There is no tenderness.  Musculoskeletal: Normal range of motion. She exhibits no edema or deformity.  Neurological:  She is alert and oriented to person, place, and time.  Skin: Skin is warm and dry. She is not diaphoretic.  Psychiatric: She has a normal mood and affect.    BP 118/70 (BP Location: Left Arm, Patient Position: Sitting, Cuff Size: Normal)   Pulse 74   Temp 98.3 F (36.8 C) (Oral)   Resp 18   Wt 240 lb 6.4 oz (109 kg)   SpO2 98%   BMI 41.26 kg/m  Wt Readings from Last 3 Encounters:  01/09/17 240 lb 6.4 oz (109 kg)  01/06/17 242 lb 4 oz (109.9 kg)  01/04/17 238 lb (108 kg)   BP Readings from Last 3 Encounters:  01/09/17 118/70  01/06/17 114/70  01/04/17 112/74     Immunization History  Administered Date(s) Administered  . Influenza Split 06/19/2012  . Influenza Whole 07/05/2007, 07/08/2009  . Influenza, High Dose Seasonal PF 05/04/2016  . Influenza,inj,Quad PF,36+ Mos 07/10/2013, 05/05/2014  . Pneumococcal Conjugate-13 07/10/2013  . Pneumococcal Polysaccharide-23 12/07/2015  . Td 09/04/2004  . Tdap 01/04/2015    Health Maintenance  Topic Date Due  . INFLUENZA VACCINE  04/04/2017  . MAMMOGRAM  11/25/2017  . COLONOSCOPY  10/03/2022  . TETANUS/TDAP  01/03/2025  . DEXA SCAN  Completed  . Hepatitis C Screening  Completed  . PNA vac Low Risk Adult  Completed    Lab Results  Component Value Date   WBC 5.5 11/27/2016   HGB 11.9 (L) 11/03/2016   HCT 38.3 11/27/2016   PLT 243.0 11/03/2016   GLUCOSE 87 11/27/2016   CHOL 180 11/27/2016   TRIG 175 (H) 11/27/2016   HDL 57 11/27/2016   LDLDIRECT 161.5  10/07/2008   LDLCALC 88 11/27/2016   ALT 9 11/27/2016   AST 23 11/27/2016   NA 141 11/27/2016   K 4.4 11/27/2016   CL 102 11/27/2016   CREATININE 0.75 11/27/2016   BUN 13 11/27/2016   CO2 26 11/27/2016   TSH 1.480 11/27/2016   INR 2.5 (H) 01/19/2009   HGBA1C 6.1 (H) 11/27/2016    Lab Results  Component Value Date   TSH 1.480 11/27/2016   Lab Results  Component Value Date   WBC 5.5 11/27/2016   HGB 11.9 (L) 11/03/2016   HCT 38.3 11/27/2016   MCV 89 11/27/2016   PLT 243.0 11/03/2016   Lab Results  Component Value Date   NA 141 11/27/2016   K 4.4 11/27/2016   CO2 26 11/27/2016   GLUCOSE 87 11/27/2016   BUN 13 11/27/2016   CREATININE 0.75 11/27/2016   BILITOT 0.4 11/27/2016   ALKPHOS 79 11/27/2016   AST 23 11/27/2016   ALT 9 11/27/2016   PROT 6.9 11/27/2016   ALBUMIN 4.3 11/27/2016   CALCIUM 9.2 11/27/2016   GFR 79.60 11/03/2016   Lab Results  Component Value Date   CHOL 180 11/27/2016   Lab Results  Component Value Date   HDL 57 11/27/2016   Lab Results  Component Value Date   LDLCALC 88 11/27/2016   Lab Results  Component Value Date   TRIG 175 (H) 11/27/2016   Lab Results  Component Value Date   CHOLHDL 3 11/03/2016   Lab Results  Component Value Date   HGBA1C 6.1 (H) 11/27/2016         Assessment & Plan:   Problem List Items Addressed This Visit    Obesity    Encouraged DASH diet, decrease po intake and increase exercise as tolerated. Needs 7-8 hours of sleep nightly. Avoid  trans fats, eat small, frequent meals every 4-5 hours with lean proteins, complex carbs and healthy fats. She has been eating more protein lately which has contributed to her constipaiton      GERD    Avoid offending foods, take probiotics. Do not eat large meals in late evening and consider raising head of bed.       Constipation - Primary    Struggling for a couple of weeks now. Unable to move bowels well now has liquid stool only. Encouraged increased hydration  and fiber in diet. Daily probiotics. If bowels not moving can use MOM 2 tbls po in 4 oz of warm prune juice by mouth every 2-3 days. If no results then repeat in 4 hours with  Dulcolax suppository pr, may repeat again in 4 more hours as needed. Seek care if symptoms worsen. Consider daily Miralax and/or Dulcolax if symptoms persist. NOW probiotic daily, benefiber twice. She failed the MOM/prune juice/suppository option. Will try Mag citrate with suppository then start new daily regimen      Relevant Orders   DG Abd 2 Views      I am having Ms. Zelada maintain her Krill Oil, ALPRAZolam, rosuvastatin, pramipexole, telmisartan-hydrochlorothiazide, carbidopa-levodopa, omeprazole, escitalopram, Vitamin D (Ergocalciferol), DULoxetine, and polyethylene glycol powder.  No orders of the defined types were placed in this encounter.   CMA served as Education administrator during this visit. History, Physical and Plan performed by medical provider. Documentation and orders reviewed and attested to.  [name], MD

## 2017-01-09 NOTE — Assessment & Plan Note (Signed)
Well controlled, no changes to meds. Encouraged heart healthy diet such as the DASH diet and exercise as tolerated.  °

## 2017-01-09 NOTE — Assessment & Plan Note (Signed)
Encouraged DASH diet, decrease po intake and increase exercise as tolerated. Needs 7-8 hours of sleep nightly. Avoid trans fats, eat small, frequent meals every 4-5 hours with lean proteins, complex carbs and healthy fats. She has been eating more protein lately which has contributed to her constipaiton

## 2017-01-09 NOTE — Progress Notes (Signed)
Pre visit review using our clinic review tool, if applicable. No additional management support is needed unless otherwise documented below in the visit note. 

## 2017-01-09 NOTE — Patient Instructions (Addendum)
HYDRATE HYDRATE HYDRATE          64 OUNCES OF WATER A DAY  "NOW Probiotic" take daily Benefiber 2 times daily  4 ounces of warm prune juice with 2 ounces of milk of magnesium with suppository  Plus MegCitrate    Constipation, Adult Constipation is when a person:  Poops (has a bowel movement) fewer times in a week than normal.  Has a hard time pooping.  Has poop that is dry, hard, or bigger than normal. Follow these instructions at home: Eating and drinking    Eat foods that have a lot of fiber, such as:  Fresh fruits and vegetables.  Whole grains.  Beans.  Eat less of foods that are high in fat, low in fiber, or overly processed, such as:  [place] fries.  Hamburgers.  Cookies.  Candy.  Soda.  Drink enough fluid to keep your pee (urine) clear or pale yellow. General instructions   Exercise regularly or as told by your doctor.  Go to the restroom when you feel like you need to poop. Do not hold it in.  Take over-the-counter and prescription medicines only as told by your doctor. These include any fiber supplements.  Do pelvic floor retraining exercises, such as:  Doing deep breathing while relaxing your lower belly (abdomen).  Relaxing your pelvic floor while pooping.  Watch your condition for any changes.  Keep all follow-up visits as told by your doctor. This is important. Contact a doctor if:  You have pain that gets worse.  You have a fever.  You have not pooped for 4 days.  You throw up (vomit).  You are not hungry.  You lose weight.  You are bleeding from the anus.  You have thin, pencil-like poop (stool). Get help right away if:  You have a fever, and your symptoms suddenly get worse.  You leak poop or have blood in your poop.  Your belly feels hard or bigger than normal (is bloated).  You have very bad belly pain.  You feel dizzy or you faint. This information is not intended to replace advice given to you by your health care  provider. Make sure you discuss any questions you have with your health care provider. Document Released: 02/07/2008 Document Revised: 03/10/2016 Document Reviewed: 02/09/2016 Elsevier Interactive Patient Education  2017 [employer].

## 2017-01-10 ENCOUNTER — Telehealth: Payer: Self-pay

## 2017-01-10 ENCOUNTER — Ambulatory Visit: Payer: Medicare Other | Admitting: Physical Therapy

## 2017-01-10 ENCOUNTER — Telehealth: Payer: Self-pay | Admitting: Family Medicine

## 2017-01-10 DIAGNOSIS — K5900 Constipation, unspecified: Secondary | ICD-10-CM

## 2017-01-10 NOTE — Telephone Encounter (Signed)
Patient notified. Agreed. 

## 2017-01-10 NOTE — Telephone Encounter (Signed)
Patient states she was seen by Dr. Charlett Blake on last week and Dr. Nani Ravens this past Saturday. States both times she was given order for medications to help her pass stoll. Patient states she has had results but only in liquid form and she would like to know if she should be expelling solid feves.Advied patient that based on ABD Ct there was no impaction. States she was seen by Santina Evans- ENT in the past and feels like she needs referral to make sure she has no problems. Currently patient denies pain cramping and distension. Advied patient Dr. Charlett Blake is not in office today and will pass information to Dr. Nani Ravens to see if he will address.

## 2017-01-10 NOTE — Addendum Note (Signed)
Addended by: Ames Coupe on: 01/10/2017 01:22 PM   Modules accepted: Orders

## 2017-01-10 NOTE — Telephone Encounter (Signed)
Caller name: Mina Surber Relationship to patient: self Can be reached: [phone] Pharmacy: [facility], [place]  Reason for call: Pt has tried recommended trx from yesterday 2x at home yesterday and no resolution. Pt is wanting to know what to do. She said Dr. Charlett Blake said she may need to go somewhere else to have taken care of. Please call asap to advise. Pt having strong discomfort.

## 2017-01-10 NOTE — Telephone Encounter (Signed)
Reason for call: Pt has tried recommended trx from yesterday 2x at home yesterday and no resolution. Pt is wanting to know what to do. She said Dr. Charlett Blake said she may need to go somewhere else to have taken care of. Please call asap to advise. Pt having strong discomfort.  Dr. Charlett Blake,  This is complaint of pt I saw at around 8:45 pm. Sorry to say busy all day and this was first opportunity. Want to let you see this in order to advise pt. Not sure if need abd 1 view xray , CT or ED evaluation to evaluate?  I am sending this to Caryl Pina and other Shirlean Mylar so they can bring this to your attention in event you don't see this as you start your day on Thursday

## 2017-01-10 NOTE — Telephone Encounter (Signed)
Continue with current plan set forth by Dr. Charlett Blake. I have placed a referral to a specialist in the meantime for another opinion. I would expect some solid stool eventually rather than continued smaller BM's. If Dr. Charlett Blake would rather hold off, we can cancel referral tomorrow. TY.

## 2017-01-11 NOTE — Telephone Encounter (Signed)
Patient was seen by PCP on 01/09/2017 for constipation. Please advise.

## 2017-01-11 NOTE — Telephone Encounter (Signed)
Spoke with the Spouses husband he stated she was not at home at the moment she was actually feeling better and she went out for the day.  He stated that she stop taking all of the remedies that was suggested to her and it in fact did make her feel better.   He was advised to have her call the ofc back, last ov she was advised that if the remedies did not work we will refer to [facility].  Awaiting call back   PC

## 2017-01-12 ENCOUNTER — Ambulatory Visit: Payer: Medicare Other | Admitting: Physical Therapy

## 2017-01-12 DIAGNOSIS — R293 Abnormal posture: Secondary | ICD-10-CM | POA: Diagnosis not present

## 2017-01-12 DIAGNOSIS — R2689 Other abnormalities of gait and mobility: Secondary | ICD-10-CM

## 2017-01-12 DIAGNOSIS — F320 Major depressive disorder, single episode, mild: Secondary | ICD-10-CM | POA: Diagnosis not present

## 2017-01-12 DIAGNOSIS — R29818 Other symptoms and signs involving the nervous system: Secondary | ICD-10-CM | POA: Diagnosis not present

## 2017-01-12 NOTE — Therapy (Signed)
[facility] [address] [place], [place], [address] Phone: [phone]   Fax:  [phone]  Physical Therapy Treatment  Patient Details  Name: Kristin [name] MRN: [id number] Date of Birth: 19-Dec-1950 Referring Provider: Alonza Bogus, DO  Encounter Date: 01/12/2017      PT End of Session - 01/12/17 0902    Visit Number 9   Number of Visits 11  per recert 10/06/52   Date for PT Re-Evaluation 02/11/17   Authorization Type Medicare primary/[employer] secondary-GCODE every 10th visit   PT Start Time 0805   PT Stop Time 0848   PT Time Calculation (min) 43 min   Activity Tolerance Patient tolerated treatment well   Behavior During Therapy [facility] for tasks assessed/performed      Past Medical History:  Diagnosis Date  . Abnormal vaginal Pap smear   . Acute pharyngitis 02/25/2013  . Anemia   . Anxiety   . Arthritis   . 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 Chicken pox as a child  . Chronic UTI    sees dr Terance Hart  . Constipation 12/07/2015  . Depression with anxiety 11/02/2009   Qualifier: Diagnosis of  By: [name], LPN, [name]   . Dermatitis 07/17/2012  . Esophageal stricture 1994  . Fibroids   . Foot pain, bilateral 06/19/2012  . GERD (gastroesophageal reflux disease)   . GERD (gastroesophageal reflux disease)   . Hiatal hernia   . Hyperglycemia 08/19/2013  . 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 Hyperlipidemia   . Hypertension   . Infertility, female   . Measles as a child  . Obesity   . Osteoarthritis   . Parkinson disease ([place])   . Plantar fasciitis of left foot 06/19/2012  . Preventative health care 12/19/2015  . Rosacea 10/05/2014  . Swallowing difficulty   . Urinary frequency 02/25/2013  . Visual floaters 05/11/2014    Past Surgical History:  Procedure Laterality Date  . ABDOMINAL HYSTERECTOMY  2006   total  . esophageal     stretching  . laporoscopy    . PARTIAL HIP  ARTHROPLASTY  2010   right  . TONSILLECTOMY    . wisdom teeth extracted      There were no vitals filed for this visit.      Subjective Assessment - 01/12/17 0809    Subjective Have had some constipation issues since last week.  It's limiting my participation in exercise classes this week.   Patient Stated Goals Pt's goals are to make sure she is using walking poles correctly as part of exercise and to be prepared for [facility].   Pain Onset More than a month ago                         [facility] Adult PT Treatment/Exercise - 01/12/17 0001      Ambulation/Gait   Ambulation/Gait Yes   Ambulation/Gait Assistance 6: Modified independent (Device/Increase time)   Ambulation/Gait Assistance Details Gait training with walking poles, outdoor surfaces, 300 ft as warm-up walking, then 3 minute walk with poles:  908 ft (improved from 649 ft); then gait with walking poles on outdoor, unlevel, grassy surfaces x 1000 ft   Ambulation Distance (Feet) --  see above   Assistive device --  bilateral walking poles   Gait Pattern Step-through pattern;Decreased step length - right;Decreased step length - left   Ambulation Surface Unlevel;Outdoor;Paved;Grass  Standardized Balance Assessment   Standardized Balance Assessment Timed Up and Go Test     Timed Up and Go Test   TUG Normal TUG;Cognitive TUG   Normal TUG (seconds) 9.13   Cognitive TUG (seconds) 9.03     High Level Balance   High Level Balance Comments MiniBESTest performed:  28/28 score  improved from 22/28     Self-Care   Self-Care Other Self-Care Comments   Other Self-Care Comments  Pt reports difficulties with constipation this week, such that has kept her from participating in exercise classes and walking.  Discussed constipation in relation to Parkinson's disease, and provided education information through [place] and [employer] regarding nutrition and constipation in PD.                 PT Education - 01/12/17 0902    Education provided Yes   Education Details Nutrition/constipation information in PD   Person(s) Educated Patient   Methods Explanation;Handout   Comprehension Verbalized understanding             PT Long Term Goals - 01/12/17 0819      PT LONG TERM GOAL #1   Title Pt will be independent with progressive HEP for balance, gait activities.  UPDATED TARGET 01/19/17   Time 5   Period Weeks   Status On-going     PT LONG TERM GOAL #2   Title Pt will improve MiniBESTest score to at least 24/28 for improved balance/decreased fall risk.   Baseline 28/28 on 01/12/17   Time 5   Period Weeks   Status Achieved     PT LONG TERM GOAL #3   Title Pt to improve somatosensory and visual system use for balance by at least 10%, as demonstrated on Sensory Organization test.  UPDATED TARGET 01/19/17   Time 5   Period Weeks   Status On-going     PT LONG TERM GOAL #4   Title Pt will improve 3 minute walk test outdoors using bilateral walking poles by at least 100 ft, for improved efficiency on outdoor surfaces.   Baseline 908 ft, improved from 649 ft in 3 minute at eval   Time 5   Period Weeks   Status Achieved     PT LONG TERM GOAL #5   Title Pt will verbalize/demonstrate improved gait stamina for outdoors with use of walking poles to at least 25 minutes, for improved posture, gait efficiency and readiness for walking program at upcoming [facility].  UPDATED GOAL 40 minutes, TARGET 01/19/17   Baseline 01/12/17:  25 minutes with poles   Time 5   Period Weeks   Status Revised               Plan - 01/12/17 0905    Clinical Impression Statement Despite pt's reported difficult week with constipation, husband's medical issues, and decr. activity level, pt is making good strides with gait and balance goals.  Pt has met LTG 2, 4 and 5.  LTG 1, 3 are ongoing and 5 is revised to reflect increased time with walking poles.  Pt will benefit  from one additional week of physical therapy prior to leaving for [facility] to review/update HEP and continue to progress walking program.   Rehab Potential Excellent   PT Frequency 2x / week   PT Duration Other (comment)  1 week, per recert 11/26/38   PT Treatment/Interventions ADLs/Self Care Home Management;Functional mobility training;Stair training;Gait training;Therapeutic activities;Therapeutic exercise;Balance training;Neuromuscular re-education;Patient/family  education   PT Next Visit Plan Check Sensory Organization test, continue compliant surfaces and gait work   Consulted and Agree with Plan of Care Patient      Patient will benefit from skilled therapeutic intervention in order to improve the following deficits and impairments:  Abnormal gait, Decreased balance, Decreased mobility, Decreased endurance, Decreased strength, Difficulty walking, Postural dysfunction  Visit Diagnosis: Other abnormalities of gait and mobility  Other symptoms and signs involving the nervous system  Abnormal posture     Problem List Patient Active Problem List   Diagnosis Date Noted  . Vitamin D deficiency 12/20/2016  . Prediabetes 12/20/2016  . Morbid obesity ([place]) 11/27/2016  . Shortness of breath on exertion 11/27/2016  . Parkinson disease ([place]) 11/27/2016  . Preventative health care 12/19/2015  . Constipation 12/07/2015  . BCC (basal cell carcinoma of skin) 10/05/2014  . Rosacea 10/05/2014  . Parkinson's disease ([place]) 05/11/2014  . Visual floaters 05/11/2014  . Paralysis agitans ([place]) 01/08/2014  . Hyperglycemia 08/19/2013  . Urinary frequency 02/25/2013  . Screening for cervical cancer 07/17/2012  . Foot pain, bilateral 06/19/2012  . 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 Obesity 11/12/2009  . Depression with anxiety 11/02/2009  . 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 Lumbar back pain with radiculopathy affecting left lower extremity 10/17/2007  . Essential  hypertension 07/05/2007  . Osteoarthritis 07/05/2007  . Hyperlipidemia 06/26/2007  . H/O: iron deficiency anemia 05/08/2007  . GERD 05/08/2007    Roderica Cathell W. 01/12/2017, 9:10 AM Frazier Butt., PT  [facility] [address] [place], [place], [address] Phone: [phone]   Fax:  [phone]  Name: Kristin [name] MRN: [id number] Date of Birth: Oct 16, 1950

## 2017-01-15 ENCOUNTER — Ambulatory Visit: Payer: Medicare Other | Admitting: Physical Therapy

## 2017-01-15 DIAGNOSIS — R29818 Other symptoms and signs involving the nervous system: Secondary | ICD-10-CM

## 2017-01-15 DIAGNOSIS — R2689 Other abnormalities of gait and mobility: Secondary | ICD-10-CM | POA: Diagnosis not present

## 2017-01-15 DIAGNOSIS — R293 Abnormal posture: Secondary | ICD-10-CM | POA: Diagnosis not present

## 2017-01-15 NOTE — Therapy (Signed)
[facility] [address] [place], [place], [address] Phone: [phone]   Fax:  [phone]  Physical Therapy Treatment  Patient Details  Name: Kristin [name] MRN: [id number] Date of Birth: 17-Mar-1951 Referring Provider: Alonza Bogus, DO  Encounter Date: 01/15/2017      PT End of Session - 01/15/17 1118    Visit Number 10   Number of Visits 11  per recert 8/52/77   Date for PT Re-Evaluation 02/11/17   Authorization Type Medicare primary/[employer] secondary-GCODE every 10th visit   PT Start Time [phone]   PT Stop Time 0930   PT Time Calculation (min) 43 min   Activity Tolerance Patient tolerated treatment well   Behavior During Therapy [facility] for tasks assessed/performed      Past Medical History:  Diagnosis Date  . Abnormal vaginal Pap smear   . Acute pharyngitis 02/25/2013  . Anemia   . Anxiety   . Arthritis   . 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 Chicken pox as a child  . Chronic UTI    sees dr Terance Hart  . Constipation 12/07/2015  . Depression with anxiety 11/02/2009   Qualifier: Diagnosis of  By: [name], LPN, [name]   . Dermatitis 07/17/2012  . Esophageal stricture 1994  . Fibroids   . Foot pain, bilateral 06/19/2012  . GERD (gastroesophageal reflux disease)   . GERD (gastroesophageal reflux disease)   . Hiatal hernia   . Hyperglycemia 08/19/2013  . 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 Hyperlipidemia   . Hypertension   . Infertility, female   . Measles as a child  . Obesity   . Osteoarthritis   . Parkinson disease ([place])   . Plantar fasciitis of left foot 06/19/2012  . Preventative health care 12/19/2015  . Rosacea 10/05/2014  . Swallowing difficulty   . Urinary frequency 02/25/2013  . Visual floaters 05/11/2014    Past Surgical History:  Procedure Laterality Date  . ABDOMINAL HYSTERECTOMY  2006   total  . esophageal     stretching  . laporoscopy    . PARTIAL HIP  ARTHROPLASTY  2010   right  . TONSILLECTOMY    . wisdom teeth extracted      There were no vitals filed for this visit.      Subjective Assessment - 01/15/17 0851    Subjective I feel so much better; my system seems back to normal.  Been walking some over the weekend (with husband)-not as fast as I should   Patient Stated Goals Pt's goals are to make sure she is using walking poles correctly as part of exercise and to be prepared for [facility].   Currently in Pain? No/denies   Pain Onset More than a month ago                         [facility] Adult PT Treatment/Exercise - 01/15/17 0001      Self-Care   Self-Care Other Self-Care Comments   Other Self-Care Comments  Discussed results of Sensory Organization test- including improvements noted, need to continue to perform corner balance exercises even beyond discharge from PT.  Discussed ways to progress compliant surface activities for HEP at home.    Discussed discharge next visit, with pt in agreement.  Asked about need for OT and speech therapy.  Pt would like to pursue OT and speech upon completion of PT to address fine motor  and voice volume.   Neuro Re-education: Sensory Organization test:  Performed, with pt scoring WNL on all 6 conditions, WNL on composite score (78%). Somatosensory, Vision, and Vestibular system WNL.      [facility]) - 01/15/17 1115    PWR! exercises Moves in standing   PWR! Up x 10 reps   PWR! Rock x 10 reps each side   PWR! Twist x 10 reps each side   PWR Step x 10 reps each side  Side, Forward, Back, 10 reps each-   Comments Standing on foam, near counter, to address additional compliant surface/dynamic component of these exercises.  Pt needs cues to use visual targets.          Balance Exercises - 01/15/17 1116      Balance Exercises: Standing   Other Standing Exercises Multi-directional stepping-forward, back, side to variable directions upon command; no LOB, but therapist  provides cues for optimal, robust nature of step to regain balance.  Balance perturbations in varied directions to evoke forward/back/side step strategy.  No LOB noted.           PT Education - 01/15/17 1117    Education provided Yes   Education Details Use of compliant surface to progress standing PWR! Moves as part of HEP   Person(s) Educated Patient   Methods Explanation;Demonstration;Handout   Comprehension Verbalized understanding;Returned demonstration             PT Long Term Goals - 01/15/17 1120      PT LONG TERM GOAL #1   Title Pt will be independent with progressive HEP for balance, gait activities.  UPDATED TARGET 01/19/17   Time 5   Period Weeks   Status On-going     PT LONG TERM GOAL #2   Title Pt will improve MiniBESTest score to at least 24/28 for improved balance/decreased fall risk.   Baseline 28/28 on 01/12/17   Time 5   Period Weeks   Status Achieved     PT LONG TERM GOAL #3   Title Pt to improve somatosensory and visual system use for balance by at least 10%, as demonstrated on Sensory Organization test.  UPDATED TARGET 01/19/17   Time 5   Period Weeks   Status Achieved     PT LONG TERM GOAL #4   Title Pt will improve 3 minute walk test outdoors using bilateral walking poles by at least 100 ft, for improved efficiency on outdoor surfaces.   Baseline 908 ft, improved from 649 ft in 3 minute at eval   Time 5   Period Weeks   Status Achieved     PT LONG TERM GOAL #5   Title Pt will verbalize/demonstrate improved gait stamina for outdoors with use of walking poles to at least 25 minutes, for improved posture, gait efficiency and readiness for walking program at upcoming [facility].  UPDATED GOAL 40 minutes, TARGET 01/19/17   Baseline 01/12/17:  25 minutes with poles   Time 5   Period Weeks   Status Revised               Plan - 01/15/17 1118    Clinical Impression Statement Sensory Organization test completed, with improvements noted to  WNL for all three sensory systems.  Continued to highlight for patient, importance of regular performance of these type of exercises for optimal balance.  Plans for checking remaining goals and discharge next visit.   Rehab Potential Excellent   PT Frequency 2x / week  PT Duration Other (comment)  1 week, per recert 1/00/71   PT Treatment/Interventions ADLs/Self Care Home Management;Functional mobility training;Stair training;Gait training;Therapeutic activities;Therapeutic exercise;Balance training;Neuromuscular re-education;Patient/family education   PT Next Visit Plan Check Sensory Organization test, continue compliant surfaces and gait work   Consulted and Agree with Plan of Care Patient      Patient will benefit from skilled therapeutic intervention in order to improve the following deficits and impairments:  Abnormal gait, Decreased balance, Decreased mobility, Decreased endurance, Decreased strength, Difficulty walking, Postural dysfunction  Visit Diagnosis: Other symptoms and signs involving the nervous system  Other abnormalities of gait and mobility       G-Codes - Jan 28, 2017 1120    Functional Assessment Tool Used (Outpatient Only) MiniBESTest 28/28, Sensory Organization test WNL (as performed today); 3 minute walk test with poles 908 ft.   Functional Limitation Mobility: Walking and moving around   Mobility: Walking and Moving Around Current Status [phone]) At least 1 percent but less than 20 percent impaired, limited or restricted   Mobility: Walking and Moving Around Goal Status [phone]) At least 1 percent but less than 20 percent impaired, limited or restricted      Problem List Patient Active Problem List   Diagnosis Date Noted  . Vitamin D deficiency 12/20/2016  . Prediabetes 12/20/2016  . Morbid obesity ([place]) 11/27/2016  . Shortness of breath on exertion 11/27/2016  . Parkinson disease ([place]) 11/27/2016  . Preventative health care 12/19/2015  . Constipation  12/07/2015  . BCC (basal cell carcinoma of skin) 10/05/2014  . Rosacea 10/05/2014  . Parkinson's disease ([place]) 05/11/2014  . Visual floaters 05/11/2014  . Paralysis agitans ([place]) 01/08/2014  . Hyperglycemia 08/19/2013  . Urinary frequency 02/25/2013  . Screening for cervical cancer 07/17/2012  . Foot pain, bilateral 06/19/2012  . 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 Obesity 11/12/2009  . Depression with anxiety 11/02/2009  . 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 Lumbar back pain with radiculopathy affecting left lower extremity 10/17/2007  . Essential hypertension 07/05/2007  . Osteoarthritis 07/05/2007  . Hyperlipidemia 06/26/2007  . H/O: iron deficiency anemia 05/08/2007  . GERD 05/08/2007    Toini Failla W. 01/28/17, 11:22 AM Frazier Butt., PT  [facility] [address] [place], [place], [address] Phone: [phone]   Fax:  [phone]  Name: Kristin [name] MRN: [id number] Date of Birth: 04-28-1951   Physical Therapy Progress Note  Dates of Reporting Period: 12/13/16 to 2017-01-28  Objective Reports of Subjective Statement: Feels more confident with compliant surfaces and outdoors   Objective Measurements: Sensory ORganization test WNL, MiniBESTest score 28/28, 908 ft in 3 minute with walking poles.    Goal Update: See goals above  Plan: One additional visit to follow up on updated HEP and on long distance walking poles gait for preparation for [facility] next week.  Reason Skilled Services are Required: Checking remaining LTGs and plan for discharge next visit.  Mady Haagensen, PT 28-Jan-2017 11:29 AM Phone: [phone] Fax: [phone]

## 2017-01-15 NOTE — Patient Instructions (Signed)
   Try standing on cushion, foam, pillow surface to perform your standing PWR! Moves

## 2017-01-17 ENCOUNTER — Telehealth: Payer: Self-pay | Admitting: Physical Therapy

## 2017-01-17 ENCOUNTER — Ambulatory Visit: Payer: Medicare Other | Admitting: Physical Therapy

## 2017-01-17 DIAGNOSIS — R293 Abnormal posture: Secondary | ICD-10-CM | POA: Diagnosis not present

## 2017-01-17 DIAGNOSIS — R29818 Other symptoms and signs involving the nervous system: Secondary | ICD-10-CM

## 2017-01-17 DIAGNOSIS — R2689 Other abnormalities of gait and mobility: Secondary | ICD-10-CM

## 2017-01-17 DIAGNOSIS — G20 Parkinson's disease: Secondary | ICD-10-CM

## 2017-01-17 NOTE — Therapy (Signed)
[facility] [address] [place], [place], [address] Phone: [phone]   Fax:  [phone]  Physical Therapy Treatment  Patient Details  Name: Kristin [name] MRN: [id number] Date of Birth: February 18, 1951 Referring Provider: Alonza Bogus, DO  Encounter Date: 01/17/2017      PT End of Session - 01/17/17 1925    Visit Number 11   Number of Visits 11  per recert 3/83/33   Date for PT Re-Evaluation 02/11/17   Authorization Type Medicare primary/[employer] secondary-GCODE every 10th visit   PT Start Time 0804   PT Stop Time 0846   PT Time Calculation (min) 42 min   Activity Tolerance Patient tolerated treatment well   Behavior During Therapy [facility] for tasks assessed/performed      Past Medical History:  Diagnosis Date  . Abnormal vaginal Pap smear   . Acute pharyngitis 02/25/2013  . Anemia   . Anxiety   . Arthritis   . 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 Chicken pox as a child  . Chronic UTI    sees dr Terance Hart  . Constipation 12/07/2015  . Depression with anxiety 11/02/2009   Qualifier: Diagnosis of  By: [name], LPN, [name]   . Dermatitis 07/17/2012  . Esophageal stricture 1994  . Fibroids   . Foot pain, bilateral 06/19/2012  . GERD (gastroesophageal reflux disease)   . GERD (gastroesophageal reflux disease)   . Hiatal hernia   . Hyperglycemia 08/19/2013  . 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 Hyperlipidemia   . Hypertension   . Infertility, female   . Measles as a child  . Obesity   . Osteoarthritis   . Parkinson disease ([place])   . Plantar fasciitis of left foot 06/19/2012  . Preventative health care 12/19/2015  . Rosacea 10/05/2014  . Swallowing difficulty   . Urinary frequency 02/25/2013  . Visual floaters 05/11/2014    Past Surgical History:  Procedure Laterality Date  . ABDOMINAL HYSTERECTOMY  2006   total  . esophageal     stretching  . laporoscopy    . PARTIAL HIP  ARTHROPLASTY  2010   right  . TONSILLECTOMY    . wisdom teeth extracted      There were no vitals filed for this visit.      Subjective Assessment - 01/17/17 0806    Subjective Nothing new today.  Still at 40 minutes with walking poles.   Patient Stated Goals Pt's goals are to make sure she is using walking poles correctly as part of exercise and to be prepared for [facility].   Currently in Pain? No/denies   Pain Onset More than a month ago                         [facility] Adult PT Treatment/Exercise - 01/17/17 0808      Transfers   Transfers Sit to Stand;Stand to Sit   Sit to Stand 6: Modified independent (Device/Increase time);Without upper extremity assist;From chair/3-in-1   Five time sit to stand comments  9.85   Stand to Sit 6: Modified independent (Device/Increase time);Without upper extremity assist;To chair/3-in-1     Ambulation/Gait   Ambulation/Gait Yes   Ambulation/Gait Assistance 6: Modified independent (Device/Increase time)   Ambulation Distance (Feet) 1600 Feet   Assistive device --  bilateral walking poles   Gait Pattern Step-through pattern;Decreased step length - right;Decreased step length - left   Ambulation  Surface Unlevel;Outdoor   Gait velocity 8.41 sec = 3.9 ft/sec   Gait Comments Pt reports walking up to 40 minutes at home with walking poles, several times since last weeks' sessions.     Timed Up and Go Test   TUG Normal TUG   Normal TUG (seconds) 9.18     Self-Care   Self-Care Other Self-Care Comments   Other Self-Care Comments  Discussed progress towards goals, plans for discharge this visit.  Discussed pt's HEP (PWR! Moves and compliant surface activities), walking program, as well as aerobic activities, ex classes.  Discussed making sure to prioritize, organize exercises and continue being consistent with exercise.  Discussed avoiding "burn-out" with exercise, as pt is concerned; discussed rotation of some exercises as a means  of organizing full HEP.  Pt does request OT and speech therapy referrals upon return from [place], and PT plans to request those from MD.           [facility]) - 01/17/17 1921    PWR! exercises Moves in standing   PWR! Up x 10 reps   PWR! Rock x 10 reps each side   PWR! Twist x 10 reps each side   PWR Step x 10 reps, side; back x 10 reps, forward x 10 reps   Comments Standing on foam-reviewed from HEP given last visit.  Pt return demo understanding.     Provided verbal cues for pt's head turns and use of visual cues during trunk rotation and weightshifting exercises above.        PT Education - 01/17/17 1925    Education provided Yes   Education Details See self-care   Person(s) Educated Patient   Methods Explanation   Comprehension Verbalized understanding             PT Long Term Goals - 01/17/17 0807      PT LONG TERM GOAL #1   Title Pt will be independent with progressive HEP for balance, gait activities.  UPDATED TARGET 01/19/17   Time 5   Period Weeks   Status Achieved     PT LONG TERM GOAL #2   Title Pt will improve MiniBESTest score to at least 24/28 for improved balance/decreased fall risk.   Baseline 28/28 on 01/12/17   Time 5   Period Weeks   Status Achieved     PT LONG TERM GOAL #3   Title Pt to improve somatosensory and visual system use for balance by at least 10%, as demonstrated on Sensory Organization test.  UPDATED TARGET 01/19/17   Time 5   Period Weeks   Status Achieved     PT LONG TERM GOAL #4   Title Pt will improve 3 minute walk test outdoors using bilateral walking poles by at least 100 ft, for improved efficiency on outdoor surfaces.   Baseline 908 ft, improved from 649 ft in 3 minute at eval   Time 5   Period Weeks   Status Achieved     PT LONG TERM GOAL #5   Title Pt will verbalize/demonstrate improved gait stamina for outdoors with use of walking poles to at least 25 minutes, for improved posture, gait efficiency  and readiness for walking program at upcoming [facility].  UPDATED GOAL 40 minutes, TARGET 01/19/17   Baseline 01/12/17:  25 minutes with poles; 40 minutes with poles on 01/17/17   Time 5   Period Weeks   Status Achieved  Plan - 2017/02/02 1926    Clinical Impression Statement Pt has met all LTGs.  Overall, pt seems more confident and consistent with comprehensive HEP to address Parkinson's deficits, balance and walking program.  Pt is appropriate for discharge at this time.   Rehab Potential Excellent   PT Frequency 2x / week   PT Duration Other (comment)  1 week, per recert 6/83/41   PT Treatment/Interventions ADLs/Self Care Home Management;Functional mobility training;Stair training;Gait training;Therapeutic activities;Therapeutic exercise;Balance training;Neuromuscular re-education;Patient/family education   PT Next Visit Plan Discharge this visit.  Plan return screen 6-9 months (after completion of OT and speech)   Recommended Other Services Pt requests OT and speech therapy evals-PT to request order from MD   Consulted and Agree with Plan of Care Patient      Patient will benefit from skilled therapeutic intervention in order to improve the following deficits and impairments:  Abnormal gait, Decreased balance, Decreased mobility, Decreased endurance, Decreased strength, Difficulty walking, Postural dysfunction  Visit Diagnosis: Other abnormalities of gait and mobility  Other symptoms and signs involving the nervous system       G-Codes - 02/02/17 1928    Functional Assessment Tool Used (Outpatient Only) Gait velocity 3.9 ft/sec   Functional Limitation Mobility: Walking and moving around   Mobility: Walking and Moving Around Goal Status [phone]) At least 1 percent but less than 20 percent impaired, limited or restricted   Mobility: Walking and Moving Around Discharge Status [phone]) At least 1 percent but less than 20 percent impaired, limited or restricted       Problem List Patient Active Problem List   Diagnosis Date Noted  . Vitamin D deficiency 12/20/2016  . Prediabetes 12/20/2016  . Morbid obesity ([place]) 11/27/2016  . Shortness of breath on exertion 11/27/2016  . Parkinson disease ([place]) 11/27/2016  . Preventative health care 12/19/2015  . Constipation 12/07/2015  . BCC (basal cell carcinoma of skin) 10/05/2014  . Rosacea 10/05/2014  . Parkinson's disease ([place]) 05/11/2014  . Visual floaters 05/11/2014  . Paralysis agitans ([place]) 01/08/2014  . Hyperglycemia 08/19/2013  . Urinary frequency 02/25/2013  . Screening for cervical cancer 07/17/2012  . Foot pain, bilateral 06/19/2012  . 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 Obesity 11/12/2009  . Depression with anxiety 11/02/2009  . 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 Lumbar back pain with radiculopathy affecting left lower extremity 10/17/2007  . Essential hypertension 07/05/2007  . Osteoarthritis 07/05/2007  . Hyperlipidemia 06/26/2007  . H/O: iron deficiency anemia 05/08/2007  . GERD 05/08/2007    Sherill Mangen W. 2017/02/02, 7:29 PM  Frazier Butt., PT  [facility] [address] [place], [place], [address] Phone: [phone]   Fax:  [phone]  Name: Kristin [name] MRN: [id number] Date of Birth: 05/09/1951   PHYSICAL THERAPY DISCHARGE SUMMARY  Visits from Start of Care: 11  Current functional level related to goals / functional outcomes: See LTGs above-pt has met all LTGs   Remaining deficits: High level balance   Education / Equipment: Educated in HEP, progression of exercise and walking programs, with pt verbalize/demo understanding.    Plan: Patient agrees to discharge.  Patient goals were met. Patient is being discharged due to meeting the stated rehab goals.  ????? Pt requests OT and speech evaluations, as she had not had those in >1 year.  Recommend return PT screen 6-9 months upon  completion of OT and speech therapy sessions.         Mady Haagensen, PT 02-02-2017 7:33  PM Phone: [phone] Fax: [phone]

## 2017-01-17 NOTE — Telephone Encounter (Signed)
Kristin [name], Kristin [name] has just completed course of physical therapy to address gait and balance.  She has requested OT and speech therapy evaluations, as it has been > 1 year since previous therapy/screens where therapy was recommended.  If you agree, could you please send via EPIC, orders for OT and speech?  Thank you, Mady Haagensen, PT

## 2017-01-18 ENCOUNTER — Ambulatory Visit (INDEPENDENT_AMBULATORY_CARE_PROVIDER_SITE_OTHER): Payer: Medicare Other | Admitting: Family Medicine

## 2017-01-18 VITALS — BP 102/67 | HR 80 | Temp 98.0°F | Ht 64.0 in | Wt 237.0 lb

## 2017-01-18 DIAGNOSIS — E559 Vitamin D deficiency, unspecified: Secondary | ICD-10-CM | POA: Diagnosis not present

## 2017-01-18 DIAGNOSIS — K5909 Other constipation: Secondary | ICD-10-CM

## 2017-01-18 NOTE — Progress Notes (Signed)
Office: [phone]  /  Fax: [phone]   HPI:   Chief Complaint: OBESITY Kristin [name] is here to discuss her progress with her obesity treatment plan. She is on the  follow the Category 2 plan and is following her eating plan approximately 50 % of the time. She states she is exercising exercise class 60 minutes 5 times per week. Kristin [name] continues to lose weight well on the Category 2 plan. She has had problems with constipation and not able to follow plan as closely in the last 2 weeks. She is going out of town for a [employer]. Her weight is 237 lb (107.5 kg) today and has had a weight loss of 1 pound over a period of 2 weeks since her last visit. She has lost 7 lbs since starting treatment with [place].  Vitamin D deficiency Kristin [name] has a diagnosis of vitamin D deficiency. She is currently taking vit D and denies nausea, vomiting or muscle weakness.  Constipation Kristin [name] notes constipation for the last few weeks, worse since attempting weight loss. She states BM are less frequent and are hard and painful. She denies hematochezia or melena.   ALLERGIES: Allergies  Allergen Reactions  . Bactrim [Sulfamethoxazole-Trimethoprim] Other (See Comments)    Oral ulcers and rash  . Penicillins Hives    MEDICATIONS: Current Outpatient Prescriptions on File Prior to Visit  Medication Sig Dispense Refill  . ALPRAZolam (XANAX) 0.5 MG tablet Take 1 tablet (0.5 mg total) by mouth as needed for anxiety or sleep. 30 tablet 1  . carbidopa-levodopa (SINEMET IR) 25-100 MG tablet take 1 tablet by mouth three times a day 90 tablet 5  . DULoxetine (CYMBALTA) 30 MG capsule take 1 capsule by mouth once daily 90 capsule 1  . escitalopram (LEXAPRO) 20 MG tablet take 1 tablet by mouth once daily 90 tablet 2  . Krill Oil CAPS 1 krill oil caps daily, MegaRed caps by Schiff    . omeprazole (PRILOSEC) 20 MG capsule take 1 capsule by mouth once daily 90 capsule 1  . polyethylene glycol powder (GLYCOLAX/MIRALAX) powder  Take 17 g by mouth 2 (two) times daily as needed (1 to 2 times per day, as needed). 850 g 0  . pramipexole (MIRAPEX) 0.5 MG tablet TAKE 2 TABLETS BY MOUTH IN THE MORNING 1 TABLET IN THE AFTERNOON AND 1 TABLET IN THE EVENING 360 tablet 3  . rosuvastatin (CRESTOR) 10 MG tablet TAKE 1 TABLET BY MOUTH ON MONDAYS, WEDNESDAY, AND FRIDAYS 90 tablet 2  . telmisartan-hydrochlorothiazide (MICARDIS HCT) 80-12.5 MG tablet TAKE 1 TABLET BY MOUTH DAILY (Patient taking differently: Take 0.5 tablets by mouth daily. ) 90 tablet 1  . Vitamin D, Ergocalciferol, (DRISDOL) 50000 units CAPS capsule Take 1 capsule (50,000 Units total) by mouth every 7 (seven) days. 4 capsule 0   No current facility-administered medications on file prior to visit.     PAST MEDICAL HISTORY: Past Medical History:  Diagnosis Date  . Abnormal vaginal Pap smear   . Acute pharyngitis 02/25/2013  . Anemia   . Anxiety   . Arthritis   . 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 Chicken pox as a child  . Chronic UTI    sees dr Terance Hart  . Constipation 12/07/2015  . Depression with anxiety 11/02/2009   Qualifier: Diagnosis of  By: [name], LPN, [name]   . Dermatitis 07/17/2012  . Esophageal stricture 1994  . Fibroids   . Foot pain, bilateral 06/19/2012  .  GERD (gastroesophageal reflux disease)   . GERD (gastroesophageal reflux disease)   . Hiatal hernia   . Hyperglycemia 08/19/2013  . 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 Hyperlipidemia   . Hypertension   . Infertility, female   . Measles as a child  . Obesity   . Osteoarthritis   . Parkinson disease ([place])   . Plantar fasciitis of left foot 06/19/2012  . Preventative health care 12/19/2015  . Rosacea 10/05/2014  . Swallowing difficulty   . Urinary frequency 02/25/2013  . Visual floaters 05/11/2014    PAST SURGICAL HISTORY: Past Surgical History:  Procedure Laterality Date  . ABDOMINAL HYSTERECTOMY  2006   total  . esophageal     stretching  . laporoscopy      . PARTIAL HIP ARTHROPLASTY  2010   right  . TONSILLECTOMY    . wisdom teeth extracted      SOCIAL HISTORY: Social History  Substance Use Topics  . Smoking status: Never Smoker  . Smokeless tobacco: Never Used  . Alcohol use 2.4 oz/week    4 Glasses of wine per week     Comment: 4 glasses of wine weekly    FAMILY HISTORY: Family History  Problem Relation Age of Onset  . Cancer Mother        breast  . Other Mother        arrythmia  . Mental illness Mother        bipolar  . Hyperlipidemia Mother   . Thyroid disease Mother   . Depression Mother   . Bipolar disorder Mother   . Heart disease Father   . Arthritis Father        rheumatoid  . Hypertension Father   . Hyperlipidemia Father   . Depression Sister   . Mental illness Sister        bipolar  . Parkinson's disease Sister   . Arthritis Sister   . Colon cancer Neg Hx   . Esophageal cancer Neg Hx   . Rectal cancer Neg Hx   . Stomach cancer Neg Hx     ROS: Review of Systems  Constitutional: Positive for weight loss.  Gastrointestinal: Positive for constipation. Negative for nausea and vomiting.  Musculoskeletal:       Negative muscle weakness    PHYSICAL EXAM: Blood pressure 102/67, height 5\' 4"  (1.626 m), weight 237 lb (107.5 kg). Body mass index is 40.68 kg/m. Physical Exam  Constitutional: She is oriented to person, place, and time. She appears well-developed and well-nourished.  Cardiovascular: Normal rate.   Pulmonary/Chest: Effort normal.  Musculoskeletal: Normal range of motion.  Neurological: She is oriented to person, place, and time.  Skin: Skin is warm and dry.  Psychiatric: She has a normal mood and affect. Her behavior is normal.  Vitals reviewed.   RECENT LABS AND TESTS: BMET    Component Value Date/Time   NA 141 11/27/2016 1123   K 4.4 11/27/2016 1123   CL 102 11/27/2016 1123   CO2 26 11/27/2016 1123   GLUCOSE 87 11/27/2016 1123   GLUCOSE 97 11/03/2016 1030   BUN 13 11/27/2016  1123   CREATININE 0.75 11/27/2016 1123   CREATININE 0.71 01/04/2015 1046   CALCIUM 9.2 11/27/2016 1123   GFRNONAA 83 11/27/2016 1123   GFRNONAA >89 01/04/2015 1046   GFRAA 96 11/27/2016 1123   GFRAA >89 01/04/2015 1046   Lab Results  Component Value Date   HGBA1C 6.1 (H) 11/27/2016   HGBA1C 6.2 11/03/2016   HGBA1C 6.0  05/04/2016   HGBA1C 6.1 12/07/2015   HGBA1C 5.9 01/04/2015   Lab Results  Component Value Date   INSULIN 18.0 11/27/2016   CBC    Component Value Date/Time   WBC 5.5 11/27/2016 1123   WBC 6.1 11/03/2016 1030   RBC 4.31 11/27/2016 1123   RBC 4.31 11/03/2016 1030   HGB 11.9 (L) 11/03/2016 1030   HCT 38.3 11/27/2016 1123   PLT 243.0 11/03/2016 1030   MCV 89 11/27/2016 1123   MCH 28.3 11/27/2016 1123   MCH 31.6 04/07/2013 0923   MCHC 31.9 11/27/2016 1123   MCHC 32.1 11/03/2016 1030   RDW 15.3 11/27/2016 1123   LYMPHSABS 1.8 11/27/2016 1123   EOSABS 0.1 11/27/2016 1123   BASOSABS 0.0 11/27/2016 1123   Iron/TIBC/Ferritin/ %Sat No results found for: IRON, TIBC, FERRITIN, IRONPCTSAT Lipid Panel     Component Value Date/Time   CHOL 180 11/27/2016 1123   TRIG 175 (H) 11/27/2016 1123   HDL 57 11/27/2016 1123   CHOLHDL 3 11/03/2016 1030   VLDL 20.6 11/03/2016 1030   LDLCALC 88 11/27/2016 1123   LDLDIRECT 161.5 10/07/2008 0944   Hepatic Function Panel     Component Value Date/Time   PROT 6.9 11/27/2016 1123   ALBUMIN 4.3 11/27/2016 1123   AST 23 11/27/2016 1123   ALT 9 11/27/2016 1123   ALKPHOS 79 11/27/2016 1123   BILITOT 0.4 11/27/2016 1123   BILIDIR 0.1 05/05/2014 0948   IBILI 0.4 04/07/2013 0923      Component Value Date/Time   TSH 1.480 11/27/2016 1123   TSH 0.87 11/03/2016 1030   TSH 1.13 05/04/2016 0935    ASSESSMENT AND PLAN: Other constipation  Vitamin D deficiency  Morbid obesity ([place])  PLAN:  Vitamin D Deficiency Kristin [name] was informed that low vitamin D levels contributes to fatigue and are associated with obesity, breast, and  colon cancer. She agrees to continue to take prescription Vit D @50 ,000 IU every week and we will check labs in 1 month and will follow up for routine testing of vitamin D, at least 2-3 times per year. She was informed of the risk of over-replacement of vitamin D and agrees to not increase her dose unless he discusses this with [place] first. Kristin [name] agrees to follow up as directed.  Constipation Kristin [name] was informed decrease bowel movement frequency is normal while losing weight, but stools should not be hard or painful. She was advised to increase her H20 intake and work on increasing her fiber intake. High fiber foods were discussed today. Angel was advised to follow up with GI and follow up with our clinic in 3 weeks.  Obesity Kristin [name] is currently in the action stage of change. As such, her goal is to continue with weight loss efforts She has agreed to follow the Category 2 plan Kristin [name] has been instructed to work up to a goal of 150 minutes of combined cardio and strengthening exercise per week for weight loss and overall health benefits. We discussed the following Behavioral Modification Strategies today: increasing lean protein intake and decreasing simple carbohydrates   Kristin [name] has agreed to follow up with our clinic in 3 weeks. She was informed of the importance of frequent follow up visits to maximize her success with intensive lifestyle modifications for her multiple health conditions.  I, Doreene Nest, am acting as scribe for Dennard Nip, MD  I have reviewed the above documentation for accuracy and completeness, and I agree with the above. -Dennard Nip, MD

## 2017-01-18 NOTE — Telephone Encounter (Signed)
Order entered

## 2017-01-19 ENCOUNTER — Encounter: Payer: Self-pay | Admitting: Nurse Practitioner

## 2017-01-19 ENCOUNTER — Ambulatory Visit (INDEPENDENT_AMBULATORY_CARE_PROVIDER_SITE_OTHER): Payer: Medicare Other | Admitting: Nurse Practitioner

## 2017-01-19 VITALS — BP 112/60 | HR 77 | Ht 64.0 in | Wt 241.0 lb

## 2017-01-19 DIAGNOSIS — F320 Major depressive disorder, single episode, mild: Secondary | ICD-10-CM | POA: Diagnosis not present

## 2017-01-19 DIAGNOSIS — K5909 Other constipation: Secondary | ICD-10-CM

## 2017-01-19 NOTE — Patient Instructions (Signed)
Continue daily fiber. Drink 64 oz of water daily.

## 2017-01-19 NOTE — Progress Notes (Signed)
HPI: Patient is a 66 year old female known to Dr. Fuller Plan. She had a screening colonoscopy in 2014. The exam was complete with an excellent prep. Exam was normal except for a small hyperplastic polyp. Patient is referred by PCP, Dr. [name] for constipation. She has been trying to lose weight and recently started a high protein diet.  No medication changes. She has Parkinson's disease which is apparently stable at this point  Patient noticed a couple weeks ago that she had not had a bowel movement in a week. PCP recommended prune juice, milk of magnesia, suppositories, and prn magnesium citrate. Patient followed this regimen for a few days but only passed some watery fluid, no actual stool. PCP started her on daily MiraLAX and Benefiber with instructions to increase fluid intake and exercise. Last week constipation started to improve. She is now having a bowel movement every day though stools are thinner. No blood in her stool. No unintentional weight loss or abdominal pain. No nausea or vomiting. Patient was worried she had a bowel blockage but plain films of the abdomen were unremarkable. CMET,  CBC, and TSH back in late March were unremarkable.    Past Medical History:  Diagnosis Date  . Abnormal vaginal Pap smear   . Acute pharyngitis 02/25/2013  . Anemia   . Anxiety   . Arthritis   . 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 Chicken pox as a child  . Chronic UTI    sees dr Terance Hart  . Constipation 12/07/2015  . Depression with anxiety 11/02/2009   Qualifier: Diagnosis of  By: [name], LPN, [name]   . Dermatitis 07/17/2012  . Esophageal stricture 1994  . Fibroids   . Foot pain, bilateral 06/19/2012  . GERD (gastroesophageal reflux disease)   . GERD (gastroesophageal reflux disease)   . Hiatal hernia   . Hyperglycemia 08/19/2013  . 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 Hyperlipidemia   . Hypertension   . Infertility, female   . Measles as a child  . Obesity    . Osteoarthritis   . Parkinson disease ([place])   . Plantar fasciitis of left foot 06/19/2012  . Preventative health care 12/19/2015  . Rosacea 10/05/2014  . Swallowing difficulty   . Visual floaters 05/11/2014     Past Surgical History:  Procedure Laterality Date  . ABDOMINAL HYSTERECTOMY  2006   total  . esophageal     stretching  . laporoscopy    . PARTIAL HIP ARTHROPLASTY  2010   right  . TONSILLECTOMY    . wisdom teeth extracted     Family History  Problem Relation Age of Onset  . Cancer Mother        breast  . Other Mother        arrythmia  . Mental illness Mother        bipolar  . Hyperlipidemia Mother   . Thyroid disease Mother   . Depression Mother   . Bipolar disorder Mother   . Heart disease Father   . Arthritis Father        rheumatoid  . Hypertension Father   . Hyperlipidemia Father   . Depression Sister   . Mental illness Sister        bipolar  . Parkinson's disease Sister   . Arthritis Sister   . Colon cancer Neg Hx   . Esophageal cancer Neg Hx   . Rectal cancer Neg Hx   .  Stomach cancer Neg Hx    Social History  Substance Use Topics  . Smoking status: Never Smoker  . Smokeless tobacco: Never Used  . Alcohol use 2.4 oz/week    4 Glasses of wine per week     Comment: 4 glasses of wine weekly   Current Outpatient Prescriptions  Medication Sig Dispense Refill  . ALPRAZolam (XANAX) 0.5 MG tablet Take 1 tablet (0.5 mg total) by mouth as needed for anxiety or sleep. 30 tablet 1  . carbidopa-levodopa (SINEMET IR) 25-100 MG tablet take 1 tablet by mouth three times a day 90 tablet 5  . DULoxetine (CYMBALTA) 30 MG capsule take 1 capsule by mouth once daily 90 capsule 1  . escitalopram (LEXAPRO) 20 MG tablet take 1 tablet by mouth once daily 90 tablet 2  . Krill Oil CAPS 1 krill oil caps daily, MegaRed caps by Schiff    . omeprazole (PRILOSEC) 20 MG capsule take 1 capsule by mouth once daily 90 capsule 1  . polyethylene glycol powder (GLYCOLAX/MIRALAX)  powder Take 17 g by mouth 2 (two) times daily as needed (1 to 2 times per day, as needed). 850 g 0  . pramipexole (MIRAPEX) 0.5 MG tablet TAKE 2 TABLETS BY MOUTH IN THE MORNING 1 TABLET IN THE AFTERNOON AND 1 TABLET IN THE EVENING 360 tablet 3  . rosuvastatin (CRESTOR) 10 MG tablet TAKE 1 TABLET BY MOUTH ON MONDAYS, WEDNESDAY, AND FRIDAYS 90 tablet 2  . telmisartan-hydrochlorothiazide (MICARDIS HCT) 80-12.5 MG tablet TAKE 1 TABLET BY MOUTH DAILY (Patient taking differently: Take 0.5 tablets by mouth daily. ) 90 tablet 1  . Vitamin D, Ergocalciferol, (DRISDOL) 50000 units CAPS capsule Take 1 capsule (50,000 Units total) by mouth every 7 (seven) days. 4 capsule 0   No current facility-administered medications for this visit.    Allergies  Allergen Reactions  . Bactrim [Sulfamethoxazole-Trimethoprim] Other (See Comments)    Oral ulcers and rash  . Penicillins Hives     Review of Systems: All systems reviewed and negative except where noted in HPI.   Physical Exam: BP 112/60   Pulse 77   Ht 5\' 4"  (1.626 m)   Wt 241 lb (109.3 kg)   BMI 41.37 kg/m  Constitutional:  Well-developed, white female in no acute distress. Psychiatric: Normal mood and affect. Behavior is normal. EENT: Conjunctivae are normal. No scleral icterus. Neck supple.  Cardiovascular: Normal rate, regular rhythm.  Pulmonary/chest: Effort normal and breath sounds normal. No wheezing, rales or rhonchi. Abdominal: Soft, nondistended, nontender. Bowel sounds active throughout. There are no masses palpable. No hepatomegaly. Extremities: no edema Lymphadenopathy: No cervical adenopathy noted. Neurological: Alert and oriented to person place and time. Skin: Skin is warm and dry. No rashes noted.   ASSESSMENT AND PLAN:  66 yo female with acute constipation. Suspect this is related to recent dietary changes (high protein diet). After additional questioning patient realizes that she has also decreased her consumption of  fruits. Her bowels are improving with the daily fiber, increased fluid intake and exercise. Abdominal films and recent labs unremarkable. At this point I have advised patient to continue current regimen . Reassurance provided. Patient will call if bowels do not continue to improve    Tye Savoy, NP  01/19/2017, 8:43 AM   Cc: [name], MD

## 2017-01-22 NOTE — Progress Notes (Signed)
Reviewed and agree with initial management plan.  Catcher Dehoyos T. Naimah Yingst, MD FACG 

## 2017-02-05 ENCOUNTER — Ambulatory Visit (INDEPENDENT_AMBULATORY_CARE_PROVIDER_SITE_OTHER): Payer: Medicare Other | Admitting: Family Medicine

## 2017-02-07 DIAGNOSIS — F320 Major depressive disorder, single episode, mild: Secondary | ICD-10-CM | POA: Diagnosis not present

## 2017-02-13 ENCOUNTER — Ambulatory Visit (INDEPENDENT_AMBULATORY_CARE_PROVIDER_SITE_OTHER): Payer: Medicare Other | Admitting: Family Medicine

## 2017-02-13 VITALS — BP 105/68 | HR 81 | Temp 98.1°F | Ht 64.0 in | Wt 235.0 lb

## 2017-02-13 DIAGNOSIS — Z6841 Body Mass Index (BMI) 40.0 and over, adult: Secondary | ICD-10-CM | POA: Diagnosis not present

## 2017-02-13 DIAGNOSIS — K5909 Other constipation: Secondary | ICD-10-CM | POA: Diagnosis not present

## 2017-02-13 NOTE — Progress Notes (Signed)
Office: [phone]  /  Fax: [phone]   HPI:   Chief Complaint: OBESITY Kristin [name] is here to discuss her progress with her obesity treatment plan. She is on the  follow the Category 2 plan and is following her eating plan approximately 50 % of the time. She states she is exercising spin class 2 times per week, circuit training, boxing and yoga 60 minutes 5 to 7 times per week. Kristin [name] continues to do well with weight loss even while out of town and increased eating out. She tried to make good choices but is struggling to get back to the category 2 plan. Her weight is 235 lb (106.6 kg) today and has had a weight loss of 2 pounds over a period of 3 to 4 weeks since her last visit. She has lost 9 lbs since starting treatment with [place].  Constipation Kristin [name] notes improved symptoms on daily benefiber. She has no abdominal pain. She has normal BM.     ALLERGIES: Allergies  Allergen Reactions  . Bactrim [Sulfamethoxazole-Trimethoprim] Other (See Comments)    Oral ulcers and rash  . Penicillins Hives    MEDICATIONS: Current Outpatient Prescriptions on File Prior to Visit  Medication Sig Dispense Refill  . ALPRAZolam (XANAX) 0.5 MG tablet Take 1 tablet (0.5 mg total) by mouth as needed for anxiety or sleep. 30 tablet 1  . carbidopa-levodopa (SINEMET IR) 25-100 MG tablet take 1 tablet by mouth three times a day 90 tablet 5  . DULoxetine (CYMBALTA) 30 MG capsule take 1 capsule by mouth once daily 90 capsule 1  . escitalopram (LEXAPRO) 20 MG tablet take 1 tablet by mouth once daily 90 tablet 2  . Krill Oil CAPS 1 krill oil caps daily, MegaRed caps by Schiff    . omeprazole (PRILOSEC) 20 MG capsule take 1 capsule by mouth once daily 90 capsule 1  . polyethylene glycol powder (GLYCOLAX/MIRALAX) powder Take 17 g by mouth 2 (two) times daily as needed (1 to 2 times per day, as needed). 850 g 0  . pramipexole (MIRAPEX) 0.5 MG tablet TAKE 2 TABLETS BY MOUTH IN THE MORNING 1 TABLET IN THE AFTERNOON AND 1  TABLET IN THE EVENING 360 tablet 3  . rosuvastatin (CRESTOR) 10 MG tablet TAKE 1 TABLET BY MOUTH ON MONDAYS, WEDNESDAY, AND FRIDAYS 90 tablet 2  . telmisartan-hydrochlorothiazide (MICARDIS HCT) 80-12.5 MG tablet TAKE 1 TABLET BY MOUTH DAILY (Patient taking differently: Take 0.5 tablets by mouth daily. ) 90 tablet 1  . Vitamin D, Ergocalciferol, (DRISDOL) 50000 units CAPS capsule Take 1 capsule (50,000 Units total) by mouth every 7 (seven) days. 4 capsule 0   No current facility-administered medications on file prior to visit.     PAST MEDICAL HISTORY: Past Medical History:  Diagnosis Date  . Abnormal vaginal Pap smear   . Acute pharyngitis 02/25/2013  . Anemia   . Anxiety   . Arthritis   . 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 Chicken pox as a child  . Chronic UTI    sees dr Terance Hart  . Constipation 12/07/2015  . Depression with anxiety 11/02/2009   Qualifier: Diagnosis of  By: [name], LPN, [name]   . Dermatitis 07/17/2012  . Esophageal stricture 1994  . Fibroids   . Foot pain, bilateral 06/19/2012  . GERD (gastroesophageal reflux disease)   . GERD (gastroesophageal reflux disease)   . Hiatal hernia   . Hyperglycemia 08/19/2013  . 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  Hyperlipidemia   . Hypertension   . Infertility, female   . Measles as a child  . Obesity   . Osteoarthritis   . Parkinson disease ([place])   . Plantar fasciitis of left foot 06/19/2012  . Preventative health care 12/19/2015  . Rosacea 10/05/2014  . Swallowing difficulty   . Urinary frequency 02/25/2013  . Visual floaters 05/11/2014    PAST SURGICAL HISTORY: Past Surgical History:  Procedure Laterality Date  . ABDOMINAL HYSTERECTOMY  2006   total  . esophageal     stretching  . laporoscopy    . PARTIAL HIP ARTHROPLASTY  2010   right  . TONSILLECTOMY    . wisdom teeth extracted      SOCIAL HISTORY: Social History  Substance Use Topics  . Smoking status: Never Smoker  . Smokeless  tobacco: Never Used  . Alcohol use 2.4 oz/week    4 Glasses of wine per week     Comment: 4 glasses of wine weekly    FAMILY HISTORY: Family History  Problem Relation Age of Onset  . Cancer Mother        breast  . Other Mother        arrythmia  . Mental illness Mother        bipolar  . Hyperlipidemia Mother   . Thyroid disease Mother   . Depression Mother   . Bipolar disorder Mother   . Heart disease Father   . Arthritis Father        rheumatoid  . Hypertension Father   . Hyperlipidemia Father   . Depression Sister   . Mental illness Sister        bipolar  . Parkinson's disease Sister   . Arthritis Sister   . Colon cancer Neg Hx   . Esophageal cancer Neg Hx   . Rectal cancer Neg Hx   . Stomach cancer Neg Hx     ROS: Review of Systems  Constitutional: Positive for weight loss.  Gastrointestinal: Positive for constipation. Negative for abdominal pain.    PHYSICAL EXAM: Blood pressure 105/68, pulse 81, temperature 98.1 F (36.7 C), temperature source Oral, height 5\' 4"  (1.626 m), weight 235 lb (106.6 kg), SpO2 95 %. Body mass index is 40.34 kg/m. Physical Exam  Constitutional: She is oriented to person, place, and time. She appears well-developed and well-nourished.  Cardiovascular: Normal rate.   Pulmonary/Chest: Effort normal.  Musculoskeletal: Normal range of motion.  Neurological: She is oriented to person, place, and time.  Skin: Skin is warm and dry.  Psychiatric: She has a normal mood and affect. Her behavior is normal.  Vitals reviewed.   RECENT LABS AND TESTS: BMET    Component Value Date/Time   NA 141 11/27/2016 1123   K 4.4 11/27/2016 1123   CL 102 11/27/2016 1123   CO2 26 11/27/2016 1123   GLUCOSE 87 11/27/2016 1123   GLUCOSE 97 11/03/2016 1030   BUN 13 11/27/2016 1123   CREATININE 0.75 11/27/2016 1123   CREATININE 0.71 01/04/2015 1046   CALCIUM 9.2 11/27/2016 1123   GFRNONAA 83 11/27/2016 1123   GFRNONAA >89 01/04/2015 1046   GFRAA 96  11/27/2016 1123   GFRAA >89 01/04/2015 1046   Lab Results  Component Value Date   HGBA1C 6.1 (H) 11/27/2016   HGBA1C 6.2 11/03/2016   HGBA1C 6.0 05/04/2016   HGBA1C 6.1 12/07/2015   HGBA1C 5.9 01/04/2015   Lab Results  Component Value Date   INSULIN 18.0 11/27/2016   CBC  Component Value Date/Time   WBC 5.5 11/27/2016 1123   WBC 6.1 11/03/2016 1030   RBC 4.31 11/27/2016 1123   RBC 4.31 11/03/2016 1030   HGB 12.2 11/27/2016 1123   HCT 38.3 11/27/2016 1123   PLT 243.0 11/03/2016 1030   MCV 89 11/27/2016 1123   MCH 28.3 11/27/2016 1123   MCH 31.6 04/07/2013 0923   MCHC 31.9 11/27/2016 1123   MCHC 32.1 11/03/2016 1030   RDW 15.3 11/27/2016 1123   LYMPHSABS 1.8 11/27/2016 1123   EOSABS 0.1 11/27/2016 1123   BASOSABS 0.0 11/27/2016 1123   Iron/TIBC/Ferritin/ %Sat No results found for: IRON, TIBC, FERRITIN, IRONPCTSAT Lipid Panel     Component Value Date/Time   CHOL 180 11/27/2016 1123   TRIG 175 (H) 11/27/2016 1123   HDL 57 11/27/2016 1123   CHOLHDL 3 11/03/2016 1030   VLDL 20.6 11/03/2016 1030   LDLCALC 88 11/27/2016 1123   LDLDIRECT 161.5 10/07/2008 0944   Hepatic Function Panel     Component Value Date/Time   PROT 6.9 11/27/2016 1123   ALBUMIN 4.3 11/27/2016 1123   AST 23 11/27/2016 1123   ALT 9 11/27/2016 1123   ALKPHOS 79 11/27/2016 1123   BILITOT 0.4 11/27/2016 1123   BILIDIR 0.1 05/05/2014 0948   IBILI 0.4 04/07/2013 0923      Component Value Date/Time   TSH 1.480 11/27/2016 1123   TSH 0.87 11/03/2016 1030   TSH 1.13 05/04/2016 0935    ASSESSMENT AND PLAN: Other constipation  Morbid obesity (HCC)  PLAN:  Constipation Kristin [name] was informed decrease bowel movement frequency is normal while losing weight, but stools should not be hard or painful. She was advised to increase her H20 intake to at least 80 ounces per day and work on increasing her fiber intake. High fiber foods were discussed today. Kristin [name] agrees to continue benefiber and follow up  with our clinic in 3 weeks.  We spent > than 50% of the 15 minute visit on the counseling as documented in the note.  Obesity Kristin [name] is currently in the action stage of change. As such, her goal is to continue with weight loss efforts She has agreed to follow the Category 2 plan Kristin [name] has been instructed to work up to a goal of 150 minutes of combined cardio and strengthening exercise per week for weight loss and overall health benefits. We discussed the following Behavioral Modification Strategies today: increasing lean protein intake, increase H2O intake, meal planning & cooking strategies and travel eating strategies.  Kristin [name] has agreed to follow up with our clinic in 3 weeks. She was informed of the importance of frequent follow up visits to maximize her success with intensive lifestyle modifications for her multiple health conditions.  Kristin [name], Doreene Nest, am acting as scribe for Kristin Nip, MD  Kristin [name] have reviewed the above documentation for accuracy and completeness, and Kristin [name] agree with the above. -Kristin Nip, MD  OBESITY BEHAVIORAL INTERVENTION VISIT  Today's visit was # 5 out of 22.  Starting weight: 244 lbs Starting date: 11/27/16 Today's weight : 235 lbs  Today's date: 02/13/2017 Total lbs lost to date: 9 (Patients must lose 7 lbs in the first 6 months to continue with counseling)   ASK: We discussed the diagnosis of obesity with Kristin [name] today and Kristin [name] agreed to give [place] permission to discuss obesity behavioral modification therapy today.  ASSESS: Bisma has the diagnosis of obesity and her BMI today is 24.4 Zykeria is in the action stage of change  ADVISE: Millette was educated on the multiple health risks of obesity as well as the benefit of weight loss to improve her health. She was advised of the need for long term treatment and the importance of lifestyle modifications.  AGREE: Multiple dietary modification options and treatment options were discussed and  Faelynn agreed to  follow the Category 2 plan We discussed the following Behavioral Modification Strategies today: increasing lean protein intake, increase H2O intake, meal planning & cooking strategies and travel eating strategies

## 2017-02-14 ENCOUNTER — Ambulatory Visit (INDEPENDENT_AMBULATORY_CARE_PROVIDER_SITE_OTHER): Payer: Medicare Other | Admitting: Family Medicine

## 2017-02-14 ENCOUNTER — Encounter (INDEPENDENT_AMBULATORY_CARE_PROVIDER_SITE_OTHER): Payer: Self-pay

## 2017-02-15 ENCOUNTER — Ambulatory Visit: Payer: Medicare Other | Attending: Neurology | Admitting: Occupational Therapy

## 2017-02-15 DIAGNOSIS — R293 Abnormal posture: Secondary | ICD-10-CM

## 2017-02-15 DIAGNOSIS — R29898 Other symptoms and signs involving the musculoskeletal system: Secondary | ICD-10-CM

## 2017-02-15 DIAGNOSIS — R2689 Other abnormalities of gait and mobility: Secondary | ICD-10-CM

## 2017-02-15 DIAGNOSIS — R29818 Other symptoms and signs involving the nervous system: Secondary | ICD-10-CM | POA: Diagnosis not present

## 2017-02-15 DIAGNOSIS — R2681 Unsteadiness on feet: Secondary | ICD-10-CM

## 2017-02-15 DIAGNOSIS — R41840 Attention and concentration deficit: Secondary | ICD-10-CM | POA: Diagnosis not present

## 2017-02-15 DIAGNOSIS — R278 Other lack of coordination: Secondary | ICD-10-CM | POA: Diagnosis not present

## 2017-02-15 DIAGNOSIS — R251 Tremor, unspecified: Secondary | ICD-10-CM

## 2017-02-15 DIAGNOSIS — F320 Major depressive disorder, single episode, mild: Secondary | ICD-10-CM | POA: Diagnosis not present

## 2017-02-15 NOTE — Therapy (Signed)
[facility] [address] [place], [place], [address] Phone: [phone]   Fax:  [phone]  Occupational Therapy Evaluation  Patient Details  Name: Kristin [name] MRN: [id number] Date of Birth: [birth year] Referring Provider: Dr. [name] Tat   Encounter Date: 02/15/2017      OT End of Session - 02/15/17 1644    Visit Number 1   Date for OT Re-Evaluation 04/16/17   Authorization Type Medicare/[employer], G-code needed   Authorization - Visit Number 1   Authorization - Number of Visits 10   OT Start Time 1941   OT Stop Time 1615   OT Time Calculation (min) 82 min   Activity Tolerance Patient tolerated treatment well   Behavior During Therapy [facility] for tasks assessed/performed      Past Medical History:  Diagnosis Date  . Abnormal vaginal Pap smear   . Acute pharyngitis 02/25/2013  . Anemia   . Anxiety   . Arthritis   . 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 Chicken pox as a child  . Chronic UTI    sees dr Terance Hart  . Constipation 12/07/2015  . Depression with anxiety 11/02/2009   Qualifier: Diagnosis of  By: [name], LPN, [name]   . Dermatitis 07/17/2012  . Esophageal stricture 1994  . Fibroids   . Foot pain, bilateral 06/19/2012  . GERD (gastroesophageal reflux disease)   . GERD (gastroesophageal reflux disease)   . Hiatal hernia   . Hyperglycemia 08/19/2013  . 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 Hyperlipidemia   . Hypertension   . Infertility, female   . Measles as a child  . Obesity   . Osteoarthritis   . Parkinson disease ([place])   . Plantar fasciitis of left foot 06/19/2012  . Preventative health care 12/19/2015  . Rosacea 10/05/2014  . Swallowing difficulty   . Urinary frequency 02/25/2013  . Visual floaters 05/11/2014    Past Surgical History:  Procedure Laterality Date  . ABDOMINAL HYSTERECTOMY  2006   total  . esophageal     stretching  . laporoscopy    . PARTIAL  HIP ARTHROPLASTY  2010   right  . TONSILLECTOMY    . wisdom teeth extracted      There were no vitals filed for this visit.      Subjective Assessment - 02/15/17 1519    Subjective  Pt just returned from [place] retreat   Patient Stated Goals fine motor coordination, improve daily activities   Currently in Pain? Yes   Pain Score 6    Pain Location Foot   Pain Orientation Right;Left;Upper   Pain Descriptors / Indicators Aching;Throbbing   Pain Type Chronic pain   Pain Onset More than a month ago   Pain Frequency Intermittent   Aggravating Factors  when standing/walking, worse at night    Pain Relieving Factors sitting           [facility] OT Assessment - 02/15/17 0001      Assessment   Diagnosis Parkinson's disease   Referring Provider Dr. [name] Tat    Onset Date --  approx 3 yrs ago   Prior Therapy OT approx 3 years ago, PT recently d/c'd     Precautions   Precautions Fall     Balance Screen   Has the patient fallen in the past 6 months No     Home  Environment   Family/patient expects to be discharged to: Private residence  Lives With Spouse     Prior Function   Level of Independence Independent   Vocation Retired   Leisure Weekly exercise routine:  spin class 1-2x/wk, boxing 1x/wk, [employer] Class, yoga once per week; walks with poles in the evenings     ADL   Eating/Feeding Modified independent  min difficulty with soup or salad   Grooming Modified independent  difficulty putting on eye make-up   Upper Body Bathing --  difficulty washing hair with RUE   Lower Body Bathing Modified independent   Upper Body Dressing --  slower w/ buttons, zipper on 1 jacket, pull-over shirt   Lower Body Dressing --  difficulty picking up and crossing feet to put on sock   Toilet Transfer Modified independent   Toileting - Clothing Manipulation Modified independent;Increased time   Tub/Shower Transfer Modified independent   Transfers/Ambulation Related to ADL's  difficulty with getting up from floor   ADL comments difficulty opening containers/scooping melon, difficulty carrying paper plate or picking up cup with R hand     IADL   Prior Level of Function Shopping --   Shopping Takes care of all shopping needs independently   Prior Level of Function Light Housekeeping Performing light tasks  has someone that cleans every 2 weeks   Prior Level of Function Meal Prep difficulty with can opener, incr difficulty with peeling, more cautious with carring items with BUEs that are hot (doesn't feel that she has control with 1 hand)   Meal Prep Plans, prepares and serves adequate meals independently   Prior Level of Function Meal Prep difficulty getting pills out of bottle, drops pills   Medication Management Is responsible for taking medication in correct dosages at correct time     Mobility   Mobility Status Independent   Mobility Status Comments doesn't carry items down steps     Written Expression   Dominant Hand Left   Handwriting --  not tested today     Vision - History   Baseline Vision Bifocals   Additional Comments pt denies problems     Activity Tolerance   Activity Tolerance Comments reports that she fatigues quickly within 30-45 with doing more complex tasks     Cognition   Overall Cognitive Status Impaired/Different from baseline  mild   Area of Impairment Attention   Current Attention Level Divided  decr per pt   Bradyphrenia Yes  expressing herself, mild word finding deficits     Observation/Other Assessments   Observations mild rounded shoulders   Focus on Therapeutic Outcomes (FOTO)  N/A   Standing Functional Reach Test R-9", L-12"   Other Surveys  Select   Physical Performance Test   Yes   Simulated Eating Time (seconds) 11.50   Simulated Eating Comments holds spoon at the end   Donning Doffing Jacket Time (seconds) 10.79sec, min difficulty with doffing (decr trunk rotation)   Donning Doffing Jacket Comments  Fastening/unfastening 3 buttons in 15.91sec      Coordination   9 Hole Peg Test Right;Left   Right 9 Hole Peg Test 24.85   Left 9 Hole Peg Test 19.50   Box and Blocks R-54blocks, L-59blocks     Tone   Assessment Location Right Upper Extremity;Left Upper Extremity     ROM / Strength   AROM / PROM / Strength AROM     AROM   Overall AROM  Within functional limits for tasks performed     RUE Tone   RUE Tone --  very  minimal     LUE Tone   LUE Tone Within Functional Limits                         OT Education - 02/15/17 1627    Education provided Yes   Education Details Considerations for Boxing Class (form, positioning, posture) to decr risk of injury; Level of Effort for Exercise/ideal itensity; Advocacy and adjustment with PD; How PD symptoms affect ADLs; Typical Cognitive Changes with PD   Person(s) Educated Patient   Methods Explanation   Comprehension Verbalized understanding          OT Short Term Goals - 02/15/17 1703      OT SHORT TERM GOAL #1   Title Pt will be independent with HEP.--check STGs 03/16/17   Time 4   Period Weeks   Status New     OT SHORT TERM GOAL #2   Title Pt will verbalize understanding of ways to prevent future complications related to PD.   Period Weeks   Status New     OT SHORT TERM GOAL #3   Title Pt will improve balance and functional reaching for IADLs as shown by improving standing functional reach by at least 2" with RUE.   Baseline R-9", L-12"   Time 4   Period Weeks   Status New           OT Long Term Goals - 02/15/17 1710      OT LONG TERM GOAL #1   Title Pt will verbalize understanding of adaptive stratgies to incr. safety/ease/independence with ADLs/IADLs (including eating, doffing jacket, buttoning, putting on make-up, donning shoes/socks, scooping food, opening bottles).--check LTGs 04/16/17   Time 8   Period Weeks   Status New     OT LONG TERM GOAL #2   Title Pt will improve coordination for  ADLs as shown by improving time on 9-hole peg test by at least 3sec with R hand.   Baseline R-24.85sec   Period Weeks   Status New     OT LONG TERM GOAL #3   Title Pt will improve functional reaching/coordination for ADLs as shown by improving score on box and blocks test by at least 3blocks with RUE.   Time 8   Period Weeks   Status New     OT LONG TERM GOAL #4   Title Pt will report incr ease with donning R shoe/sock.   Time 8   Period Weeks   Status New     OT LONG TERM GOAL #5   Title Pt will report incr ease with donning pull-over shirt.   Time 8   Period Weeks   Status New               Plan - 02/15/17 1646    Clinical Impression Statement Pt presents today with bradykinesia, rigidity, decr coordination, decr posture, decr balance/functional mobilty for ADLs/IADLs, tremor affecting ADL/IADL performance and UE functional use.  Pt would benefit from occupational therapy to address these deficits to improve ADL/IADL performance, to prevent future complications, and update PD-specific HEP.   Occupational Profile and client history currently impacting functional performance Pt is a 66 y.o. female with diagnosis of Parkinson's disease.  Pt is very active.  Pt has had OT in the past, but it has been approx 3 years.  Husband also has chonic illness.  Pt with PMH that includes:  hyperlipidema, obesity, depression, anxiety, HTN, OA, visual floaters, basal cell carcinoma of  skin, prediabetes, vitamin D deficiency.   Occupational performance deficits (Please refer to evaluation for details): ADL's;IADL's;Leisure;Social Participation   Rehab Potential Good   OT Frequency 2x / week   OT Duration 8 weeks  +eval    OT Treatment/Interventions Self-care/ADL training;Energy conservation;Manual Therapy;Passive range of motion;Cognitive remediation/compensation;Functional Mobility Training;Neuromuscular education;Cryotherapy;Therapeutic activities;Therapeutic exercise;Therapeutic  exercises;Patient/family education;DME and/or AE instruction;Moist Heat   Plan PWR! hands, coordination HEP   Clinical Decision Making Several treatment options, min-mod task modification necessary   Consulted and Agree with Plan of Care Patient      Patient will benefit from skilled therapeutic intervention in order to improve the following deficits and impairments:  Decreased cognition, Decreased mobility, Impaired UE functional use, Decreased knowledge of use of DME, Decreased balance, Decreased activity tolerance, Impaired tone, Improper spinal/pelvic alignment, Decreased coordination (bradykinesia)  Visit Diagnosis: Other symptoms and signs involving the nervous system  Other symptoms and signs involving the musculoskeletal system  Other lack of coordination  Abnormal posture  Other abnormalities of gait and mobility  Unsteadiness on feet  Tremor  Attention and concentration deficit      G-Codes - 02/25/17 1725    Functional Assessment Tool Used (Outpatient only) difficulty with putting on eye make-up, eating, buttoning, donning R shoe/sock, scooping food, opening bottles, donning pull-over shirt   Functional Limitation Self care   Self Care Current Status (D6222) At least 20 percent but less than 40 percent impaired, limited or restricted   Self Care Goal Status (L7989) At least 1 percent but less than 20 percent impaired, limited or restricted      Problem List Patient Active Problem List   Diagnosis Date Noted  . Vitamin D deficiency 12/20/2016  . Prediabetes 12/20/2016  . Morbid obesity ([place]) 11/27/2016  . Shortness of breath on exertion 11/27/2016  . Parkinson disease ([place]) 11/27/2016  . Preventative health care 12/19/2015  . Constipation 12/07/2015  . BCC (basal cell carcinoma of skin) 10/05/2014  . Rosacea 10/05/2014  . Parkinson's disease ([place]) 05/11/2014  . Visual floaters 05/11/2014  . Paralysis agitans ([place]) 01/08/2014  . Hyperglycemia 08/19/2013  .  Urinary frequency 02/25/2013  . Screening for cervical cancer 07/17/2012  . Foot pain, bilateral 06/19/2012  . 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 Obesity 11/12/2009  . Depression with anxiety 11/02/2009  . 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 Lumbar back pain with radiculopathy affecting left lower extremity 10/17/2007  . Essential hypertension 07/05/2007  . Osteoarthritis 07/05/2007  . Hyperlipidemia 06/26/2007  . H/O: iron deficiency anemia 05/08/2007  . GERD 05/08/2007    [facility] Feb 25, 2017, 5:26 PM  [facility] [address] [place], [place], [address] Phone: [phone]   Fax:  [phone]  Name: Kristin [name] MRN: [id number] Date of Birth: 1951/06/04   Vianne Bulls, OTR/L [facility] [address] [place], [place]  [address] [phone] phone [phone] 02-25-2017 5:26 PM

## 2017-02-27 ENCOUNTER — Ambulatory Visit: Payer: Medicare Other | Admitting: Occupational Therapy

## 2017-02-27 DIAGNOSIS — R29898 Other symptoms and signs involving the musculoskeletal system: Secondary | ICD-10-CM

## 2017-02-27 DIAGNOSIS — R29818 Other symptoms and signs involving the nervous system: Secondary | ICD-10-CM

## 2017-02-27 DIAGNOSIS — R2689 Other abnormalities of gait and mobility: Secondary | ICD-10-CM

## 2017-02-27 DIAGNOSIS — R251 Tremor, unspecified: Secondary | ICD-10-CM

## 2017-02-27 DIAGNOSIS — R278 Other lack of coordination: Secondary | ICD-10-CM

## 2017-02-27 DIAGNOSIS — R41840 Attention and concentration deficit: Secondary | ICD-10-CM

## 2017-02-27 DIAGNOSIS — R293 Abnormal posture: Secondary | ICD-10-CM

## 2017-02-27 DIAGNOSIS — R2681 Unsteadiness on feet: Secondary | ICD-10-CM

## 2017-02-27 NOTE — Therapy (Signed)
[facility] [address] [place], [place], [address] Phone: [phone]   Fax:  [phone]  Occupational Therapy Treatment  Patient Details  Name: Kristin [name] MRN: [id number] Date of Birth: December 22, 1950 Referring Provider: Dr. [name] Tat   Encounter Date: 02/27/2017      OT End of Session - 02/27/17 1031    Visit Number 2   Date for OT Re-Evaluation 04/16/17   Authorization Type Medicare/[employer], G-code needed   Authorization - Visit Number 2   Authorization - Number of Visits 10   OT Start Time [phone]   OT Stop Time 1025   OT Time Calculation (min) 46 min   Activity Tolerance Patient tolerated treatment well   Behavior During Therapy [facility] for tasks assessed/performed      Past Medical History:  Diagnosis Date  . Abnormal vaginal Pap smear   . Acute pharyngitis 02/25/2013  . Anemia   . Anxiety   . Arthritis   . 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 Chicken pox as a child  . Chronic UTI    sees dr Terance Hart  . Constipation 12/07/2015  . Depression with anxiety 11/02/2009   Qualifier: Diagnosis of  By: [name], LPN, [name]   . Dermatitis 07/17/2012  . Esophageal stricture 1994  . Fibroids   . Foot pain, bilateral 06/19/2012  . GERD (gastroesophageal reflux disease)   . GERD (gastroesophageal reflux disease)   . Hiatal hernia   . Hyperglycemia 08/19/2013  . 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 Hyperlipidemia   . Hypertension   . Infertility, female   . Measles as a child  . Obesity   . Osteoarthritis   . Parkinson disease ([place])   . Plantar fasciitis of left foot 06/19/2012  . Preventative health care 12/19/2015  . Rosacea 10/05/2014  . Swallowing difficulty   . Urinary frequency 02/25/2013  . Visual floaters 05/11/2014    Past Surgical History:  Procedure Laterality Date  . ABDOMINAL HYSTERECTOMY  2006   total  . esophageal     stretching  . laporoscopy    . PARTIAL HIP  ARTHROPLASTY  2010   right  . TONSILLECTOMY    . wisdom teeth extracted      There were no vitals filed for this visit.      Subjective Assessment - 02/27/17 0940    Subjective  I see the benefit of these activities more.  This is challenging.   Patient Stated Goals fine motor coordination, improve daily activities   Currently in Pain? Yes   Pain Score 4    Pain Location --  L knee   Pain Orientation Left   Pain Descriptors / Indicators Aching  popping   Pain Type --  worse in the last 10 days   Pain Onset 1 to 4 weeks ago   Pain Frequency Intermittent   Aggravating Factors  standing from sitting   Pain Relieving Factors sitting                              OT Education - 02/27/17 1030    Education Details PWR! hands (basic 4); Initiated Coordination HEP (card activities)--see pt instructions   Person(s) Educated Patient   Methods Explanation;Demonstration;Verbal cues;Handout   Comprehension Verbalized understanding;Returned demonstration;Verbal cues required          OT Short Term Goals - 02/15/17 1703  OT SHORT TERM GOAL #1   Title Pt will be independent with HEP.--check STGs 03/16/17   Time 4   Period Weeks   Status New     OT SHORT TERM GOAL #2   Title Pt will verbalize understanding of ways to prevent future complications related to PD.   Period Weeks   Status New     OT SHORT TERM GOAL #3   Title Pt will improve balance and functional reaching for IADLs as shown by improving standing functional reach by at least 2" with RUE.   Baseline R-9", L-12"   Time 4   Period Weeks   Status New           OT Long Term Goals - 02/15/17 1710      OT LONG TERM GOAL #1   Title Pt will verbalize understanding of adaptive stratgies to incr. safety/ease/independence with ADLs/IADLs (including eating, doffing jacket, buttoning, putting on make-up, donning shoes/socks, scooping food, opening bottles).--check LTGs 04/16/17   Time 8   Period  Weeks   Status New     OT LONG TERM GOAL #2   Title Pt will improve coordination for ADLs as shown by improving time on 9-hole peg test by at least 3sec with R hand.   Baseline R-24.85sec   Period Weeks   Status New     OT LONG TERM GOAL #3   Title Pt will improve functional reaching/coordination for ADLs as shown by improving score on box and blocks test by at least 3blocks with RUE.   Time 8   Period Weeks   Status New     OT LONG TERM GOAL #4   Title Pt will report incr ease with donning R shoe/sock.   Time 8   Period Weeks   Status New     OT LONG TERM GOAL #5   Title Pt will report incr ease with donning pull-over shirt.   Time 8   Period Weeks   Status New               Plan - 02/27/17 0941    Clinical Impression Statement Pt demo good response to cueing for large amplitude movements for HEP.   Rehab Potential Good   OT Frequency 2x / week   OT Duration 8 weeks  +eval    OT Treatment/Interventions Self-care/ADL training;Energy conservation;Manual Therapy;Passive range of motion;Cognitive remediation/compensation;Functional Mobility Training;Neuromuscular education;Cryotherapy;Therapeutic activities;Therapeutic exercise;Therapeutic exercises;Patient/family education;DME and/or AE instruction;Moist Heat   Plan add to coordination HEP   OT Home Exercise Plan Education provided:   02/27/17  PWR! hands (basic 4), Coordination HEP (card activiites)   Consulted and Agree with Plan of Care Patient      Patient will benefit from skilled therapeutic intervention in order to improve the following deficits and impairments:  Decreased cognition, Decreased mobility, Impaired UE functional use, Decreased knowledge of use of DME, Decreased balance, Decreased activity tolerance, Impaired tone, Improper spinal/pelvic alignment, Decreased coordination (bradykinesia)  Visit Diagnosis: Other symptoms and signs involving the nervous system  Other symptoms and signs involving the  musculoskeletal system  Other lack of coordination  Other abnormalities of gait and mobility  Abnormal posture  Unsteadiness on feet  Tremor  Attention and concentration deficit    Problem List Patient Active Problem List   Diagnosis Date Noted  . Vitamin D deficiency 12/20/2016  . Prediabetes 12/20/2016  . Morbid obesity ([place]) 11/27/2016  . Shortness of breath on exertion 11/27/2016  . Parkinson disease ([place]) 11/27/2016  .  Preventative health care 12/19/2015  . Constipation 12/07/2015  . BCC (basal cell carcinoma of skin) 10/05/2014  . Rosacea 10/05/2014  . Parkinson's disease ([place]) 05/11/2014  . Visual floaters 05/11/2014  . Paralysis agitans ([place]) 01/08/2014  . Hyperglycemia 08/19/2013  . Urinary frequency 02/25/2013  . Screening for cervical cancer 07/17/2012  . Foot pain, bilateral 06/19/2012  . 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 Obesity 11/12/2009  . Depression with anxiety 11/02/2009  . 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 Lumbar back pain with radiculopathy affecting left lower extremity 10/17/2007  . Essential hypertension 07/05/2007  . Osteoarthritis 07/05/2007  . Hyperlipidemia 06/26/2007  . H/O: iron deficiency anemia 05/08/2007  . GERD 05/08/2007    [facility] 02/27/2017, 10:33 AM  [facility] [address] [place], [place], [address] Phone: [phone]   Fax:  [phone]  Name: Kristin [name] MRN: [id number] Date of Birth: [birth year]   Vianne Bulls, OTR/L [facility] [address] [place], [place]  [address] [phone] phone [phone] 02/27/17 10:34 AM

## 2017-02-27 NOTE — Patient Instructions (Signed)
PWR! Hand Exercises  Then, start with elbows bent and hands closed:   PWR! Hands: Push hands out BIG. Elbows straight, wrists up, fingers open and spread apart BIG. (Can also perform by pushing down on table, chair, knees. Push above head, out to the side, behind you, in front of you.)   PWR! Step: Touch index finger to thumb while keeping other fingers straight. Flick fingers out BIG (thumb out/straighten fingers). Repeat with other fingers. (Step your thumb to each finger).   With arms stretched out in front of you (elbows straight), perform the following:   PWR! Rock:  Move wrists up and down [employer]! Twist: Twist palms up and down BIG    ** Make each movement big and deliberate so that you feel the movement.  Perform at least 10 repetitions 1x/day, but perform PWR! Hands throughout the day when you are having trouble using your hands (picking up/manipulating small objects, writing, eating, typing, sewing, buttoning, etc.).    Coordination Exercises  Perform the following exercises for 20 minutes at least 4 times a week. Perform with both hand(s). Perform using big movements.   Flipping Cards: Place deck of cards on the table. Flip cards over by opening your hand big to grasp and then turn your palm up big, opening hand fully to release.  Deal cards: Hold 1/2 or whole deck in your hand. Use thumb to push card off top of deck with one big push.  Place card on tabletop. Then flick fingers (extend fingers) powerfully to slide card off table (can have chair/box below table to catch the cards).

## 2017-03-01 DIAGNOSIS — F320 Major depressive disorder, single episode, mild: Secondary | ICD-10-CM | POA: Diagnosis not present

## 2017-03-02 NOTE — Progress Notes (Signed)
Subjective:    Kristin [name] was seen in consultation in the movement disorder clinic at the request of Mosie Lukes, MD.  The evaluation is for tremor.  The patient is a 66 y.o. left handed female with a history of tremor.  The records that were made available to me were reviewed.  According to records, the patient has complained about tremor to her previous primary care physician all the way back to 2012, but in those records she had said that tremor had been going on for years.  She states today, however, that she really thinks that it started in 2012.  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  R hand was always worse even though she is L hand dominant.  It has slowly progressed.  She reports that tremor is in both hands now (R still worse) but she feels tremor on the inside of the body all of the time.  She has tremor at rest now.  She feels nervous now and is unsure if it was related.  She also has the sweats and so she was placed on xanax but that doesn't help the sweating.  She recently went to an exercise class and noted that she wasn't as coordinated/strong with the R hand and that worried her and that is why she wanted a further evaluation.    She did see [name] in June, 2013 and I reviewed that note.  She was diagnosed with essential tremor.  No treatment was recommended.  There is  family hx of tremor in her father.  She states that she has been on metoprolol since 2012 and didn't think that it helped but when she tried to get off of it a year ago, the tremor got worse and she couldn't get off of it.    03/09/14 update:  The patient presents today for follow up.  Today, she is accompanied by her husband who helps to supplement the history.  He was not present on the prior visit.  She was diagnosed with PD last visit.  I started her on Mirapex.  She has been doing better in terms of tremor.  Interestingly, sweats and sleep have been better.  She  just retired last week.  No side effects with the Mirapex.  She is currently taking it at 9 AM/2 PM/9 PM. No compulsive behaviors.  No sleep attacks.  She has been attending therapy.  She is planning on starting the Parkinson's exercise class.  She has been educating herself and reading patient education material.  She had a modified barium evaluation on 02/25/2014.  It was normal, although they suspected that she had mild esophageal dysphagia.  Her husband asks multiple questions today and asked about potentially having another opinion.  The patient has had no falls.  No hallucinations.  07/15/14 update:  Pt is f/u today, accompanied by her husband who supplements the history.  Pt is on mirapex 0.5 mg three times per day.  I stopped her metoprolol last visit. She did fine with that. She is exercising with the Moves class and with circuit II class.  She is also enrolled in the bike class at the [facility].  She is having some soreness because of the exercise and hip pain because of it and she has had a hip replacement and worries about that as she doesn't want to have another.  Thinking about trying chiropractics for that.  She asks me about some.  Stages that she has on the lateral aspect of the knees.  She does not wear boots.  She has not had a knee replacement.  She also complains of some achiness in her toes and it gets better as she walks.  No falls.  No lightheadedness.    11/11/14 update:  The patient returns today for follow-up.  She is on Mirapex, 0.5 mg 3 times per day.  Since our last visit, the patient did go to [facility] for a second opinion.  She saw Dr. [name].  This was just 2 days ago.  No notes are available.  They state that they had a good visit, are glad that they went and got a second opinion and no new recommendations/treatments were given.  She has been doing well at home.  She is very active in our community exercise programs for PD.  She is enrolled in the [facility] exercise program and in our  circuit class and is walking on the treadmill.  She notices tremor in public but generally not as much at home.  She notes wearing off of the medication but only when time for the next dosage.    02/09/15 update:  The patient is following up today regarding her Parkinson's disease.  She is accompanied by her husband who supplements the history.  She did get a second opinion at [place] with Dr. [name] and his fellow, and [name], on 11/17/2014.  I reviewed those notes.  I told her that she could increase the Mirapex if needed to control tremor further.  She is currently on Mirapex 0.5 mg 3 times per day (7am/1:30 pm/7:00).  She is on Lexapro and Cymbalta for depression.  She is doing exercise.   She went to circuit class for PD today and she will have tremor when doing that.  She notes that when doing exercise, she has more tremor.  No falls.  She went to [place] for 2 weeks with a tour group and she felt that she was able to keep up with the group.  She felt better on this trip than she did 2 years ago on a trip.  05/12/15 update:  The patient falls up today regarding her Parkinson's disease. She is accompanied by her husband who supplements the history.   Last visit, I had her increase her Mirapex to 1 mg in the morning and she remained on 0.5 mg in the afternoon and 0.5 mg in the evening. She was initially tired with the increase in the medication but has adjusted now.  Her daughter and 8 month old grandson are living with them.  She is exercising on the treadmill.  She is doing the Parkinsons exercise class.  She is going to try the boxing class and is in the [employer] class and it is increasing to 2 days a week. She has been doing well with mood on a combination of Lexapro and Cymbalta.  She denies any falls.  No lightheadedness or near syncope.  No hallucinations.  08/12/15 update:  The patient follows up today, accompanied by her husband who supplements the history.  The patient is on pramipexole, 1.0 mg in the  morning, 0.5 mg in the afternoon and 0.5 mg in the evening.  She is also planning on attending the [facility] retreat as a patient, which should be very good for her.  She remains on Lexapro and Cymbalta.  They're working well for her.  She is thinking about getting a therapist and asks me about  that.  She also asks me about possible supplements for Parkinson's disease.  She continues to exercise a lot.  She has not had any falls.  No dyskinesia.  No lightheadedness or near syncope.  12/01/15 update:  The patient follows up today, accompanied by her husband who supplements the history.  The patient is on pramipexole, 1.0 mg in the morning, 0.5 mg in the afternoon and 0.5 mg in the evening.  She is also planning on attending the [facility] retreat as a patient, which should be very good for her.  Its in [place].  She remains on Lexapro and Cymbalta.  They're working well for her.  She continues to exercise a lot.  Had not been pole walking and just started and noted that her arms are very sore.  Still knows that she needs a therapist and asked about it last time and gave her Valeda Malm name but she hasn't done it.  She has not had any falls.  No dyskinesia.  No lightheadedness or near syncope.  She just saw Dr. [name] on 11/08/15 and I reviewed those notes.  No changes were made.    03/14/16 update:  The patient follows up today for her PD.  The patient is on pramipexole, 1.0 mg in the morning, 0.5 mg in the afternoon and 0.5 mg in the evening.  She attended the [facility] retreat in [place] as a patient.  She went at the end of May.  States that it was very challenging.   She remains on Lexapro and Cymbalta.  They're working well for her.  She continues to exercise a lot.  She has not had any falls.  No dyskinesia.  No lightheadedness or near syncope.  Noting some turning in of the L ankle. Not sure if related to timing of medication.   States that husband was dx with MDS since our last visit.  Because of that, she has not she has  not been able to attend PD therapy locally because she has been running back and forth to [place] with him.  Sister with dx with PD since our last visit.    10/31/16 update:  Patient follows up today.  She is on pramipexole, 1 mg in the morning, 0.5 mg in the afternoon and evening.  Last visit, we initiated levodopa and she worked her way to 1 tablet 3 times per day.  She has not had side effects.  No dyskinesia.  No falls.  Is having some dizziness or near syncope.  She admits that she is not hydrating well.  She is having more choking on water/liquid/saliva.  She is  planning to attend the [facility] retreat again in [place].  She has had a fairly rough time since our last visit, as she was in [place] with several months with her husband who had been awaiting a bone marrow transplant for leukemia.  He had this 06/2016.  He still won't leave the house because he is scared of germs but he has been given the clearance to do so.  She did join a gym and was exercising while she was in [place].  Pt is going to counseling, [employer] but her husband has been resistant to do so.  Pt went to social work PD support group.  She is getting back to exercise here.  She has started back to spears biking and [facility] classes.  She feels a bit defeated about how hard the boxing class is.   03/05/17 update:  Patient seen today  in follow-up.  She is on pramipexole, 1 mg in the morning and 0.5 mg in the afternoon and evening.  She is still on carbidopa/levodopa 25/100, one tablet 3 times per day.  Some cramping of the 2nd toe at night bilaterally.  She has been to the [employer] and [place] again since our last visit and did well with that.  Her sister did go with her.  Pt denies falls.  Finished PT and doing OT now.  Pt denies lightheadedness, near syncope.  No hallucinations.  Mood has been good. Still seeing Trevor Mace, her counselor.  She has an adult autistic son that has a college degree but has trouble keeping a job and that has  been stressful for her. Is seeing Dr. Leafy Ro now regarding weight loss.  Having some joint pain.  Going to see knee doctor as well.  Lets feel "itchy" at night and she is scratching them at night and getting exoriations on them from scratching.  Current/Previously tried tremor medications: metoprololol  Current medications that may exacerbate tremor:  Cymbalta, although I reviewed that the patient did try to change to Pristiq but the patient had nightmares with Pristiq and changed back to Cymbalta  Outside reports reviewed: historical medical records, lab reports and referral letter/letters.  Allergies  Allergen Reactions  . Bactrim [Sulfamethoxazole-Trimethoprim] Other (See Comments)    Oral ulcers and rash  . Penicillins Hives    Current Outpatient Prescriptions on File Prior to Visit  Medication Sig Dispense Refill  . ALPRAZolam (XANAX) 0.5 MG tablet Take 1 tablet (0.5 mg total) by mouth as needed for anxiety or sleep. 30 tablet 1  . carbidopa-levodopa (SINEMET IR) 25-100 MG tablet take 1 tablet by mouth three times a day 90 tablet 5  . DULoxetine (CYMBALTA) 30 MG capsule take 1 capsule by mouth once daily 90 capsule 1  . escitalopram (LEXAPRO) 20 MG tablet take 1 tablet by mouth once daily 90 tablet 2  . Krill Oil CAPS 1 krill oil caps daily, MegaRed caps by Schiff    . omeprazole (PRILOSEC) 20 MG capsule take 1 capsule by mouth once daily 90 capsule 1  . polyethylene glycol powder (GLYCOLAX/MIRALAX) powder Take 17 g by mouth 2 (two) times daily as needed (1 to 2 times per day, as needed). 850 g 0  . pramipexole (MIRAPEX) 0.5 MG tablet TAKE 2 TABLETS BY MOUTH IN THE MORNING 1 TABLET IN THE AFTERNOON AND 1 TABLET IN THE EVENING 360 tablet 3  . rosuvastatin (CRESTOR) 10 MG tablet TAKE 1 TABLET BY MOUTH ON MONDAYS, WEDNESDAY, AND FRIDAYS 90 tablet 2  . telmisartan-hydrochlorothiazide (MICARDIS HCT) 80-12.5 MG tablet TAKE 1 TABLET BY MOUTH DAILY (Patient taking differently: Take 0.5  tablets by mouth daily. ) 90 tablet 1  . Vitamin D, Ergocalciferol, (DRISDOL) 50000 units CAPS capsule Take 1 capsule (50,000 Units total) by mouth every 7 (seven) days. 4 capsule 0   No current facility-administered medications on file prior to visit.     Past Medical History:  Diagnosis Date  . Abnormal vaginal Pap smear   . Acute pharyngitis 02/25/2013  . Anemia   . Anxiety   . Arthritis   . 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 Chicken pox as a child  . Chronic UTI    sees dr Terance Hart  . Constipation 12/07/2015  . Depression with anxiety 11/02/2009   Qualifier: Diagnosis of  By: [name], LPN, [name]   . Dermatitis  07/17/2012  . Esophageal stricture 1994  . Fibroids   . Foot pain, bilateral 06/19/2012  . GERD (gastroesophageal reflux disease)   . GERD (gastroesophageal reflux disease)   . Hiatal hernia   . Hyperglycemia 08/19/2013  . 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 Hyperlipidemia   . Hypertension   . Infertility, female   . Measles as a child  . Obesity   . Osteoarthritis   . Parkinson disease ([place])   . Plantar fasciitis of left foot 06/19/2012  . Preventative health care 12/19/2015  . Rosacea 10/05/2014  . Swallowing difficulty   . Urinary frequency 02/25/2013  . Visual floaters 05/11/2014    Past Surgical History:  Procedure Laterality Date  . ABDOMINAL HYSTERECTOMY  2006   total  . esophageal     stretching  . laporoscopy    . PARTIAL HIP ARTHROPLASTY  2010   right  . TONSILLECTOMY    . wisdom teeth extracted      Social History   Social History  . Marital status: Married    Spouse name: N/A  . Number of children: N/A  . Years of education: N/A   Occupational History  . Retired Teacher, music    Social History Main Topics  . Smoking status: Never Smoker  . Smokeless tobacco: Never Used  . Alcohol use 2.4 oz/week    4 Glasses of wine per week     Comment: 4 glasses of wine weekly  . Drug use: No  . Sexual activity: Yes      Partners: Male   Other Topics Concern  . Not on file   Social History Narrative   Lives with husband, no major dietary restrictions, retired from teaching      Husband dx with cancer MDS June 2017.   Currently in remission. (11/03/16 pc)          Family Status  Relation Status  . Mother Deceased at age 78       Dementia, breast cancer, bipolar  . Father Deceased at age 67       heart failure, rheumatoid arthritis  . Sister Alive       arthritis, PD  . Sister Alive  . Daughter Alive       75  . Son Alive       81  . MGM Deceased  . MGF Deceased  . PGM Deceased  . PGF Deceased  . Sister Alive        healthy  . Sister Alive       healthy  . Daughter Alive       28/ healthy  . Neg Hx (Not Specified)    Review of Systems A complete 10 system ROS was obtained and was negative apart from what is mentioned.   Objective:   VITALS:   There were no vitals filed for this visit. Wt Readings from Last 3 Encounters:  02/13/17 235 lb (106.6 kg)  01/19/17 241 lb (109.3 kg)  01/18/17 237 lb (107.5 kg)     Gen:  Appears stated age and in NAD.  She is diaphoretic (room is also hot) HEENT:  Normocephalic, atraumatic. The mucous membranes are moist.  Cardiovascular: Regular rate and rhythm Lungs: Clear to auscultation bilaterally Neck: No carotid bruits  NEUROLOGICAL:  Orientation: The patient is alert and oriented x3. Fund of knowledge is appropriate.  Recent and remote memory are intact.  Attention and concentration are normal.    Able to name objects and repeat phrases. Cranial  nerves: There is good facial symmetry. The visual fields are full to confrontational testing. The speech is fluent and clear. Soft palate rises symmetrically and there is no tongue deviation. Hearing is intact to conversational tone. Sensation: Sensation is intact to light touch throughout. Motor: Strength is 5/5 in the bilateral upper and lower extremities.   Shoulder shrug is equal and  symmetric.  There is no pronator drift.   Movement examination: Tone: There is normal tone in the upper and lower extremity. Abnormal movements: There is a mild RUE tremor. There is an intermittent RLE/LLE tremor that is intermittent Coordination:  There is no decremation with any form of RAMS, including alternating supination and pronation of the forearm, hand opening and closing, finger taps, heel taps and toe taps bilaterally. Gait and Station: The patient has no significant difficulty arising out of a deep-seated chair without the use of the hands. The patient's stride length is fairly normal, and there is improved arm swing compared to the past.  The patient has a negative pull test.      LABS  Lab Results  Component Value Date   TSH 1.480 11/27/2016   Lab Results  Component Value Date   WBC 5.5 11/27/2016   HGB 12.2 11/27/2016   HCT 38.3 11/27/2016   MCV 89 11/27/2016   PLT 243.0 11/03/2016     Chemistry      Component Value Date/Time   NA 141 11/27/2016 1123   K 4.4 11/27/2016 1123   CL 102 11/27/2016 1123   CO2 26 11/27/2016 1123   BUN 13 11/27/2016 1123   CREATININE 0.75 11/27/2016 1123   CREATININE 0.71 01/04/2015 1046      Component Value Date/Time   CALCIUM 9.2 11/27/2016 1123   ALKPHOS 79 11/27/2016 1123   AST 23 11/27/2016 1123   ALT 9 11/27/2016 1123   BILITOT 0.4 11/27/2016 1123     Lab Results  Component Value Date   WBC 5.5 11/27/2016   HGB 12.2 11/27/2016   HCT 38.3 11/27/2016   MCV 89 11/27/2016   PLT 243.0 11/03/2016   No results found for: IRON, TIBC, FERRITIN      Assessment/Plan:   1. idiopathic Parkinson's disease.  The patient has tremor, bradykinesia, rigidity and mild postural instability.  -continue pramipexole 1 mg in the morning and continue the 0.5 mg in the afternoon and evening.  She has had no compulsive side effects.  Risks, benefits, side effects and alternative therapies were discussed.  The opportunity to ask questions  was given and they were answered to the best of my ability.  The patient expressed understanding and willingness to follow the outlined treatment protocols.  -Continue carbidopa/levodopa 25/100 one tablet tid.  Risks, benefits, side effects and alternative therapies were discussed.  The opportunity to ask questions was given and they were answered to the best of my ability.  The patient expressed understanding and willingness to follow the outlined treatment protocols.  -having some cramping of the toes at night.  Talked about carbidopa/levodopa 50/200 at night she wasn't sure that she wanted to pursue that.  She is having some "itching" of the legs at night and she is scratching and exoriating and not sure if this is possibly RLS (vs actual bites).  Told her that if it is RLS, then carbidopa/levodopa 50/200 at night could help.  Will try that.  If no help, f/u with PCP.  Doesn't believe bed bugs, etc as husband doesn't have same problem and doesn't think  actually getting bit.   2.  Depression and anxiety  - She is on a combination of Lexapro and Cymbalta and she is doing well.    -She is now seeing a counselor and will continue to do so 3.  Dizziness  -improved 4.  Dysphagia  -last MBE 12/2015.  Liquids becoming slightly more problematic.  Talked about chin tuck maneuver.  Use straw.   5.  Follow-up in the next 3 months, sooner should new neurologic issues arise.  Much greater than 50% of this visit was spent in counseling and coordinating care.  Total face to face time:  40 min discussing importance of taking time for self care and safety in PD (similar discussion as last visit but needed to be reiterated today)

## 2017-03-04 ENCOUNTER — Other Ambulatory Visit: Payer: Self-pay | Admitting: Family Medicine

## 2017-03-05 ENCOUNTER — Ambulatory Visit (INDEPENDENT_AMBULATORY_CARE_PROVIDER_SITE_OTHER): Payer: Medicare Other | Admitting: Family Medicine

## 2017-03-05 ENCOUNTER — Ambulatory Visit (INDEPENDENT_AMBULATORY_CARE_PROVIDER_SITE_OTHER): Payer: Medicare Other | Admitting: Neurology

## 2017-03-05 ENCOUNTER — Other Ambulatory Visit: Payer: Medicare Other

## 2017-03-05 ENCOUNTER — Encounter: Payer: Self-pay | Admitting: Neurology

## 2017-03-05 VITALS — BP 105/68 | HR 82 | Temp 98.1°F | Ht 64.0 in | Wt 237.0 lb

## 2017-03-05 VITALS — BP 90/62 | HR 84 | Ht 64.0 in | Wt 241.0 lb

## 2017-03-05 DIAGNOSIS — F3289 Other specified depressive episodes: Secondary | ICD-10-CM

## 2017-03-05 DIAGNOSIS — IMO0001 Reserved for inherently not codable concepts without codable children: Secondary | ICD-10-CM

## 2017-03-05 DIAGNOSIS — R1319 Other dysphagia: Secondary | ICD-10-CM | POA: Diagnosis not present

## 2017-03-05 DIAGNOSIS — Z6841 Body Mass Index (BMI) 40.0 and over, adult: Secondary | ICD-10-CM | POA: Diagnosis not present

## 2017-03-05 DIAGNOSIS — F418 Other specified anxiety disorders: Secondary | ICD-10-CM | POA: Diagnosis not present

## 2017-03-05 DIAGNOSIS — E669 Obesity, unspecified: Secondary | ICD-10-CM | POA: Diagnosis not present

## 2017-03-05 DIAGNOSIS — D649 Anemia, unspecified: Secondary | ICD-10-CM | POA: Diagnosis not present

## 2017-03-05 DIAGNOSIS — E559 Vitamin D deficiency, unspecified: Secondary | ICD-10-CM

## 2017-03-05 DIAGNOSIS — G20 Parkinson's disease: Secondary | ICD-10-CM

## 2017-03-05 DIAGNOSIS — G2581 Restless legs syndrome: Secondary | ICD-10-CM

## 2017-03-05 MED ORDER — CARBIDOPA-LEVODOPA ER 50-200 MG PO TBCR: 1.0000 | EXTENDED_RELEASE_TABLET | Freq: Every day | ORAL | 1 refills | Status: DC

## 2017-03-05 MED ORDER — VITAMIN D (ERGOCALCIFEROL) 1.25 MG (50000 UNIT) PO CAPS: 50000.0000 [IU] | ORAL_CAPSULE | ORAL | 0 refills | Status: DC

## 2017-03-05 NOTE — Patient Instructions (Signed)
Good to see you today!  I need to get some lab work.    Take the new carbidopa/levodopa 50/200 one hour prior to bedtime.  Don't change the timing of your other meds.  See you at the symposium on 7/13!

## 2017-03-05 NOTE — Progress Notes (Signed)
Office: [phone]  /  Fax: [phone]   HPI:   Chief Complaint: OBESITY Kristin [name] is here to discuss her progress with her obesity treatment plan. She is on the  follow the Category 2 plan and is following her eating plan approximately 50 % of the time. She states she is exercising boxing circuit, walking, Yoga, Spin class for 45 minutes 6 times per week. [name] has struggled with emotional eating in the last week and has gained weight and is frustrated. She is ready to get back on track. Her weight is 237 lb (107.5 kg) today and has had a weight gain of 2 pounds over a period of 3 weeks since her last visit. She has lost 7 lbs since starting treatment with [place].  Vitamin D deficiency [name] has a diagnosis of vitamin D deficiency. She is currently stable on vit D and denies nausea, vomiting or muscle weakness.  Depression with emotional eating behaviors [name] is on Cymbalta and Lexapro and has had increased stressors with family issues and turned to emotional eating. She is ready to get back on track. [name] struggles with emotional eating and using food for comfort to the extent that it is negatively impacting her health. She often snacks when she is not hungry. [name] sometimes feels she is out of control and then feels guilty that she made poor food choices. She has been working on behavior modification techniques to help reduce her emotional eating and has been somewhat successful. She shows no sign of suicidal or homicidal ideations.  Depression screen [facility] 2/9 11/27/2016 12/07/2015  Decreased Interest 1 0  Down, Depressed, Hopeless 3 0  PHQ - 2 Score 4 0  Altered sleeping 0 -  Tired, decreased energy 2 -  Change in appetite 3 -  Feeling bad or failure about yourself  3 -  Trouble concentrating 1 -  Moving slowly or fidgety/restless 0 -  Suicidal thoughts 0 -  PHQ-9 Score 13 -     ALLERGIES: Allergies  Allergen Reactions  . Bactrim [Sulfamethoxazole-Trimethoprim] Other (See Comments)   Oral ulcers and rash  . Penicillins Hives    MEDICATIONS: Current Outpatient Prescriptions on File Prior to Visit  Medication Sig Dispense Refill  . ALPRAZolam (XANAX) 0.5 MG tablet Take 1 tablet (0.5 mg total) by mouth as needed for anxiety or sleep. 30 tablet 1  . carbidopa-levodopa (SINEMET CR) 50-200 MG tablet Take 1 tablet by mouth at bedtime. 90 tablet 1  . carbidopa-levodopa (SINEMET IR) 25-100 MG tablet take 1 tablet by mouth three times a day 90 tablet 5  . DULoxetine (CYMBALTA) 30 MG capsule take 1 capsule by mouth once daily 90 capsule 1  . escitalopram (LEXAPRO) 20 MG tablet take 1 tablet by mouth once daily 90 tablet 2  . omeprazole (PRILOSEC) 20 MG capsule take 1 capsule by mouth once daily 90 capsule 1  . polyethylene glycol powder (GLYCOLAX/MIRALAX) powder Take 17 g by mouth 2 (two) times daily as needed (1 to 2 times per day, as needed). 850 g 0  . pramipexole (MIRAPEX) 0.5 MG tablet TAKE 2 TABLETS BY MOUTH IN THE MORNING 1 TABLET IN THE AFTERNOON AND 1 TABLET IN THE EVENING 360 tablet 3  . rosuvastatin (CRESTOR) 10 MG tablet TAKE 1 TABLET BY MOUTH ON MONDAYS, WEDNESDAYS, AND FRIDAYS 90 tablet 0  . telmisartan-hydrochlorothiazide (MICARDIS HCT) 80-12.5 MG tablet TAKE 1 TABLET BY MOUTH DAILY (Patient taking differently: Take 0.5 tablets by mouth daily. ) 90 tablet 1  .  Vitamin D, Ergocalciferol, (DRISDOL) 50000 units CAPS capsule Take 1 capsule (50,000 Units total) by mouth every 7 (seven) days. 4 capsule 0   No current facility-administered medications on file prior to visit.     PAST MEDICAL HISTORY: Past Medical History:  Diagnosis Date  . Abnormal vaginal Pap smear   . Acute pharyngitis 02/25/2013  . Anemia   . Anxiety   . Arthritis   . 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 Chicken pox as a child  . Chronic UTI    sees dr Terance Hart  . Constipation 12/07/2015  . Depression with anxiety 11/02/2009   Qualifier: Diagnosis of  By: [name], LPN, [name]   . Dermatitis 07/17/2012  . Esophageal stricture 1994  . Fibroids   . Foot pain, bilateral 06/19/2012  . GERD (gastroesophageal reflux disease)   . GERD (gastroesophageal reflux disease)   . Hiatal hernia   . Hyperglycemia 08/19/2013  . 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 Hyperlipidemia   . Hypertension   . Infertility, female   . Measles as a child  . Obesity   . Osteoarthritis   . Parkinson disease ([place])   . Plantar fasciitis of left foot 06/19/2012  . Preventative health care 12/19/2015  . Rosacea 10/05/2014  . Swallowing difficulty   . Urinary frequency 02/25/2013  . Visual floaters 05/11/2014    PAST SURGICAL HISTORY: Past Surgical History:  Procedure Laterality Date  . ABDOMINAL HYSTERECTOMY  2006   total  . esophageal     stretching  . laporoscopy    . PARTIAL HIP ARTHROPLASTY  2010   right  . TONSILLECTOMY    . wisdom teeth extracted      SOCIAL HISTORY: Social History  Substance Use Topics  . Smoking status: Never Smoker  . Smokeless tobacco: Never Used  . Alcohol use 2.4 oz/week    4 Glasses of wine per week     Comment: 4 glasses of wine weekly    FAMILY HISTORY: Family History  Problem Relation Age of Onset  . Cancer Mother        breast  . Other Mother        arrythmia  . Mental illness Mother        bipolar  . Hyperlipidemia Mother   . Thyroid disease Mother   . Depression Mother   . Bipolar disorder Mother   . Heart disease Father   . Arthritis Father        rheumatoid  . Hypertension Father   . Hyperlipidemia Father   . Depression Sister   . Mental illness Sister        bipolar  . Parkinson's disease Sister   . Arthritis Sister   . Colon cancer Neg Hx   . Esophageal cancer Neg Hx   . Rectal cancer Neg Hx   . Stomach cancer Neg Hx     ROS: Review of Systems  Constitutional: Negative for weight loss.  Gastrointestinal: Negative for nausea and vomiting.  Musculoskeletal:       Negative muscle weakness    Psychiatric/Behavioral: Positive for depression. Negative for suicidal ideas.    PHYSICAL EXAM: Blood pressure 105/68, pulse 82, temperature 98.1 F (36.7 C), temperature source Oral, height 5\' 4"  (1.626 m), weight 237 lb (107.5 kg), SpO2 98 %. Body mass index is 40.68 kg/m. Physical Exam  Constitutional: She is oriented to person, place, and time. She appears well-developed and well-nourished.  Cardiovascular: Normal rate.  Pulmonary/Chest: Effort normal.  Musculoskeletal: Normal range of motion.  Neurological: She is oriented to person, place, and time.  Skin: Skin is warm and dry.  Psychiatric: She has a normal mood and affect. Her behavior is normal.  Vitals reviewed.   RECENT LABS AND TESTS: BMET    Component Value Date/Time   NA 141 11/27/2016 1123   K 4.4 11/27/2016 1123   CL 102 11/27/2016 1123   CO2 26 11/27/2016 1123   GLUCOSE 87 11/27/2016 1123   GLUCOSE 97 11/03/2016 1030   BUN 13 11/27/2016 1123   CREATININE 0.75 11/27/2016 1123   CREATININE 0.71 01/04/2015 1046   CALCIUM 9.2 11/27/2016 1123   GFRNONAA 83 11/27/2016 1123   GFRNONAA >89 01/04/2015 1046   GFRAA 96 11/27/2016 1123   GFRAA >89 01/04/2015 1046   Lab Results  Component Value Date   HGBA1C 6.1 (H) 11/27/2016   HGBA1C 6.2 11/03/2016   HGBA1C 6.0 05/04/2016   HGBA1C 6.1 12/07/2015   HGBA1C 5.9 01/04/2015   Lab Results  Component Value Date   INSULIN 18.0 11/27/2016   CBC    Component Value Date/Time   WBC 5.5 11/27/2016 1123   WBC 6.1 11/03/2016 1030   RBC 4.31 11/27/2016 1123   RBC 4.31 11/03/2016 1030   HGB 12.2 11/27/2016 1123   HCT 38.3 11/27/2016 1123   PLT 243.0 11/03/2016 1030   MCV 89 11/27/2016 1123   MCH 28.3 11/27/2016 1123   MCH 31.6 04/07/2013 0923   MCHC 31.9 11/27/2016 1123   MCHC 32.1 11/03/2016 1030   RDW 15.3 11/27/2016 1123   LYMPHSABS 1.8 11/27/2016 1123   EOSABS 0.1 11/27/2016 1123   BASOSABS 0.0 11/27/2016 1123   Iron/TIBC/Ferritin/ %Sat No results  found for: IRON, TIBC, FERRITIN, IRONPCTSAT Lipid Panel     Component Value Date/Time   CHOL 180 11/27/2016 1123   TRIG 175 (H) 11/27/2016 1123   HDL 57 11/27/2016 1123   CHOLHDL 3 11/03/2016 1030   VLDL 20.6 11/03/2016 1030   LDLCALC 88 11/27/2016 1123   LDLDIRECT 161.5 10/07/2008 0944   Hepatic Function Panel     Component Value Date/Time   PROT 6.9 11/27/2016 1123   ALBUMIN 4.3 11/27/2016 1123   AST 23 11/27/2016 1123   ALT 9 11/27/2016 1123   ALKPHOS 79 11/27/2016 1123   BILITOT 0.4 11/27/2016 1123   BILIDIR 0.1 05/05/2014 0948   IBILI 0.4 04/07/2013 0923      Component Value Date/Time   TSH 1.480 11/27/2016 1123   TSH 0.87 11/03/2016 1030   TSH 1.13 05/04/2016 0935    ASSESSMENT AND PLAN: Other depression  Vitamin D deficiency - Plan: Vitamin D, Ergocalciferol, (DRISDOL) 50000 units CAPS capsule  Class 3 obesity with serious comorbidity and body mass index (BMI) of 40.0 to 44.9 in adult, unspecified obesity type (HCC)  PLAN:  Vitamin D Deficiency Perlita was informed that low vitamin D levels contributes to fatigue and are associated with obesity, breast, and colon cancer. She agrees to continue to take prescription Vit D @50 ,000 IU every week, we will refill for 1 month and will follow up for routine testing of vitamin D, at least 2-3 times per year. She was informed of the risk of over-replacement of vitamin D and agrees to not increase her dose unless he discusses this with [place] first. Naavya agrees to follow up with our clinic in 2 to 3 weeks.  Depression with Emotional Eating Behaviors We discussed behavior modification techniques today to help [facility]  deal with her emotional eating and depression. She has agreed to cognitive behavioral therapy to help decrease emotional eating and she agreed to follow up as directed.  Obesity Ruther is currently in the action stage of change. As such, her goal is to continue with weight loss efforts She has agreed to follow the  Category 2 plan Katelen has been instructed to work up to a goal of 150 minutes of combined cardio and strengthening exercise per week for weight loss and overall health benefits. We discussed the following Behavioral Modification Strategies today: increasing lean protein intake and dealing with family or coworker sabotage  Duaa has agreed to follow up with our clinic in 2 to 3 weeks. She was informed of the importance of frequent follow up visits to maximize her success with intensive lifestyle modifications for her multiple health conditions.  I, Doreene Nest, am acting as transcriptionist for Dennard Nip, MD  I have reviewed the above documentation for accuracy and completeness, and I agree with the above. -Dennard Nip, MD  OBESITY BEHAVIORAL INTERVENTION VISIT  Today's visit was # 6 out of 22.  Starting weight: 244 lbs Starting date: 11/27/16 Today's weight : 237 lbs Today's date: 03/05/2017 Total lbs lost to date: 7 (Patients must lose 7 lbs in the first 6 months to continue with counseling)   ASK: We discussed the diagnosis of obesity with Kyra Searles today and Stanton Kidney agreed to give [place] permission to discuss obesity behavioral modification therapy today.  ASSESS: Mataya has the diagnosis of obesity and her BMI today is 57.8 Adelee is in the action stage of change   ADVISE: Doshia was educated on the multiple health risks of obesity as well as the benefit of weight loss to improve her health. She was advised of the need for long term treatment and the importance of lifestyle modifications.  AGREE: Multiple dietary modification options and treatment options were discussed and  Amorette agreed to follow the Category 2 plan We discussed the following Behavioral Modification Strategies today: increasing lean protein intake and dealing with family or coworker sabotage

## 2017-03-06 ENCOUNTER — Ambulatory Visit: Payer: Medicare Other | Attending: Neurology | Admitting: Occupational Therapy

## 2017-03-06 ENCOUNTER — Telehealth: Payer: Self-pay | Admitting: Neurology

## 2017-03-06 DIAGNOSIS — R278 Other lack of coordination: Secondary | ICD-10-CM | POA: Diagnosis not present

## 2017-03-06 DIAGNOSIS — R2689 Other abnormalities of gait and mobility: Secondary | ICD-10-CM

## 2017-03-06 DIAGNOSIS — R2681 Unsteadiness on feet: Secondary | ICD-10-CM | POA: Diagnosis not present

## 2017-03-06 DIAGNOSIS — R29898 Other symptoms and signs involving the musculoskeletal system: Secondary | ICD-10-CM | POA: Insufficient documentation

## 2017-03-06 DIAGNOSIS — R29818 Other symptoms and signs involving the nervous system: Secondary | ICD-10-CM

## 2017-03-06 DIAGNOSIS — R251 Tremor, unspecified: Secondary | ICD-10-CM | POA: Diagnosis not present

## 2017-03-06 DIAGNOSIS — R41840 Attention and concentration deficit: Secondary | ICD-10-CM | POA: Diagnosis not present

## 2017-03-06 DIAGNOSIS — R293 Abnormal posture: Secondary | ICD-10-CM | POA: Diagnosis not present

## 2017-03-06 LAB — FE+TIBC+FER
Ferritin: 12 ng/mL — ABNORMAL LOW (ref 15–150)
Iron Saturation: 8 % — CL (ref 15–55)
Iron: 31 ug/dL (ref 27–139)
Total Iron Binding Capacity: 402 ug/dL (ref 250–450)
UIBC: 371 ug/dL — ABNORMAL HIGH (ref 118–369)

## 2017-03-06 NOTE — Therapy (Signed)
[facility] [address] [place], [place], [address] Phone: [phone]   Fax:  [phone]  Occupational Therapy Treatment  Patient Details  Name: Kristin [name] MRN: [id number] Date of Birth: November 01, 1950 Referring Provider: Dr. [name] Tat   Encounter Date: 03/06/2017      OT End of Session - 03/06/17 0819    Visit Number 3   Date for OT Re-Evaluation 04/16/17   Authorization Type Medicare/[employer], G-code needed   Authorization - Visit Number 3   Authorization - Number of Visits 10   OT Start Time 0805   OT Stop Time 0845   OT Time Calculation (min) 40 min   Activity Tolerance Patient tolerated treatment well   Behavior During Therapy [facility] for tasks assessed/performed      Past Medical History:  Diagnosis Date  . Abnormal vaginal Pap smear   . Acute pharyngitis 02/25/2013  . Anemia   . Anxiety   . Arthritis   . 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 Chicken pox as a child  . Chronic UTI    sees dr Terance Hart  . Constipation 12/07/2015  . Depression with anxiety 11/02/2009   Qualifier: Diagnosis of  By: [name], LPN, [name]   . Dermatitis 07/17/2012  . Esophageal stricture 1994  . Fibroids   . Foot pain, bilateral 06/19/2012  . GERD (gastroesophageal reflux disease)   . GERD (gastroesophageal reflux disease)   . Hiatal hernia   . Hyperglycemia 08/19/2013  . 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 Hyperlipidemia   . Hypertension   . Infertility, female   . Measles as a child  . Obesity   . Osteoarthritis   . Parkinson disease ([place])   . Plantar fasciitis of left foot 06/19/2012  . Preventative health care 12/19/2015  . Rosacea 10/05/2014  . Swallowing difficulty   . Urinary frequency 02/25/2013  . Visual floaters 05/11/2014    Past Surgical History:  Procedure Laterality Date  . ABDOMINAL HYSTERECTOMY  2006   total  . esophageal     stretching  . laporoscopy    . PARTIAL HIP  ARTHROPLASTY  2010   right  . TONSILLECTOMY    . wisdom teeth extracted      There were no vitals filed for this visit.      Subjective Assessment - 03/06/17 0809    Subjective  Dealing with the thumb is my hardest exercise   Patient Stated Goals fine motor coordination, improve daily activities   Currently in Pain? No/denies   Pain Onset --                              OT Education - 03/06/17 1017    Education Details Reviewed card coordination HEP and added ball activities--see pt instructions   Person(s) Educated Patient   Methods Explanation;Demonstration;Handout;Verbal cues   Comprehension Verbalized understanding;Returned demonstration;Verbal cues required  min cueing for large amplitude          OT Short Term Goals - 02/15/17 1703      OT SHORT TERM GOAL #1   Title Pt will be independent with HEP.--check STGs 03/16/17   Time 4   Period Weeks   Status New     OT SHORT TERM GOAL #2   Title Pt will verbalize understanding of ways to prevent future complications related to PD.   Period Weeks   Status  New     OT SHORT TERM GOAL #3   Title Pt will improve balance and functional reaching for IADLs as shown by improving standing functional reach by at least 2" with RUE.   Baseline R-9", L-12"   Time 4   Period Weeks   Status New           OT Long Term Goals - 02/15/17 1710      OT LONG TERM GOAL #1   Title Pt will verbalize understanding of adaptive stratgies to incr. safety/ease/independence with ADLs/IADLs (including eating, doffing jacket, buttoning, putting on make-up, donning shoes/socks, scooping food, opening bottles).--check LTGs 04/16/17   Time 8   Period Weeks   Status New     OT LONG TERM GOAL #2   Title Pt will improve coordination for ADLs as shown by improving time on 9-hole peg test by at least 3sec with R hand.   Baseline R-24.85sec   Period Weeks   Status New     OT LONG TERM GOAL #3   Title Pt will improve  functional reaching/coordination for ADLs as shown by improving score on box and blocks test by at least 3blocks with RUE.   Time 8   Period Weeks   Status New     OT LONG TERM GOAL #4   Title Pt will report incr ease with donning R shoe/sock.   Time 8   Period Weeks   Status New     OT LONG TERM GOAL #5   Title Pt will report incr ease with donning pull-over shirt.   Time 8   Period Weeks   Status New               Plan - 03/06/17 7106    Clinical Impression Statement Pt is progressing with improved performance with cueing for large amplitude movements.     Rehab Potential Good   OT Frequency 2x / week   OT Duration 8 weeks  +eval    OT Treatment/Interventions Self-care/ADL training;Energy conservation;Manual Therapy;Passive range of motion;Cognitive remediation/compensation;Functional Mobility Training;Neuromuscular education;Cryotherapy;Therapeutic activities;Therapeutic exercise;Therapeutic exercises;Patient/family education;DME and/or AE instruction;Moist Heat   Plan review updates to coordination HEP and make additions as appropriate   OT Home Exercise Plan Education provided:   02/27/17  PWR! hands (basic 4), Coordination HEP (card activiites)   Consulted and Agree with Plan of Care Patient      Patient will benefit from skilled therapeutic intervention in order to improve the following deficits and impairments:  Decreased cognition, Decreased mobility, Impaired UE functional use, Decreased knowledge of use of DME, Decreased balance, Decreased activity tolerance, Impaired tone, Improper spinal/pelvic alignment, Decreased coordination (bradykinesia)  Visit Diagnosis: Other symptoms and signs involving the nervous system  Other symptoms and signs involving the musculoskeletal system  Other lack of coordination  Other abnormalities of gait and mobility  Abnormal posture  Unsteadiness on feet  Tremor  Attention and concentration deficit    Problem  List Patient Active Problem List   Diagnosis Date Noted  . Vitamin D deficiency 12/20/2016  . Prediabetes 12/20/2016  . Morbid obesity ([place]) 11/27/2016  . Shortness of breath on exertion 11/27/2016  . Parkinson disease ([place]) 11/27/2016  . Preventative health care 12/19/2015  . Constipation 12/07/2015  . BCC (basal cell carcinoma of skin) 10/05/2014  . Rosacea 10/05/2014  . Parkinson's disease ([place]) 05/11/2014  . Visual floaters 05/11/2014  . Paralysis agitans ([place]) 01/08/2014  . Hyperglycemia 08/19/2013  . Urinary frequency 02/25/2013  . Screening for  cervical cancer 07/17/2012  . Foot pain, bilateral 06/19/2012  . 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 Obesity 11/12/2009  . Depression with anxiety 11/02/2009  . 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 Lumbar back pain with radiculopathy affecting left lower extremity 10/17/2007  . Essential hypertension 07/05/2007  . Osteoarthritis 07/05/2007  . Hyperlipidemia 06/26/2007  . H/O: iron deficiency anemia 05/08/2007  . GERD 05/08/2007    [facility] 03/06/2017, 10:19 AM  [facility] [address] [place], [place], [address] Phone: [phone]   Fax:  [phone]  Name: Kristin [name] MRN: [id number] Date of Birth: 11/15/1950   Vianne Bulls, OTR/L [facility] [address] [place], [place]  [address] [phone] phone [phone] 03/06/17 10:19 AM

## 2017-03-06 NOTE — Patient Instructions (Signed)
Coordination Exercises  Perform the following exercises for 20 minutes at least 4 times a week. Perform with both hand(s). Perform using big movements.   Flipping Cards: Place deck of cards on the table. Flip cards over by opening your hand big to grasp and then turn your palm up big, opening hand fully to release.  Deal cards: Hold 1/2 or whole deck in your hand. Use thumb to push card off top of deck with one big push.  Place card on tabletop. Then flick fingers (extend fingers) powerfully to slide card off table (can have chair/box below table to catch the cards).  Rotate ball with fingertips: Pick up with fingers/thumb and move as much as you can with each turn/movement (clockwise and counter-clockwise).  Toss ball from one hand to the other: Toss big/high.  Deliberately open with toss and deliberately close hand after catch.  Toss ball in the air and catch with the same hand: Toss big/high.  Deliberately open with toss and deliberately close hand after catch.

## 2017-03-06 NOTE — Telephone Encounter (Signed)
Patient made aware of results and need for iron supplement/stool softener. She will start this as instructed.

## 2017-03-06 NOTE — Telephone Encounter (Signed)
-----   Message from [place], DO sent at 03/06/2017 11:51 AM EDT ----- Tell pt she is iron deficient which may be cause of RLS.  Start ferrous sulfate 325 mg bid which may make constipated (so take colace daily) but also f/u with PCP to make sure no etiology for this

## 2017-03-12 ENCOUNTER — Ambulatory Visit: Payer: Medicare Other | Admitting: Occupational Therapy

## 2017-03-12 DIAGNOSIS — R293 Abnormal posture: Secondary | ICD-10-CM | POA: Diagnosis not present

## 2017-03-12 DIAGNOSIS — R2689 Other abnormalities of gait and mobility: Secondary | ICD-10-CM | POA: Diagnosis not present

## 2017-03-12 DIAGNOSIS — R29818 Other symptoms and signs involving the nervous system: Secondary | ICD-10-CM

## 2017-03-12 DIAGNOSIS — R2681 Unsteadiness on feet: Secondary | ICD-10-CM | POA: Diagnosis not present

## 2017-03-12 DIAGNOSIS — R29898 Other symptoms and signs involving the musculoskeletal system: Secondary | ICD-10-CM

## 2017-03-12 DIAGNOSIS — R278 Other lack of coordination: Secondary | ICD-10-CM

## 2017-03-12 DIAGNOSIS — R251 Tremor, unspecified: Secondary | ICD-10-CM

## 2017-03-12 NOTE — Therapy (Signed)
[facility] [address] [place], [place], [address] Phone: [phone]   Fax:  [phone]  Occupational Therapy Treatment  Patient Details  Name: Kristin [name] MRN: [id number] Date of Birth: 10-06-1950 Referring Provider: Dr. [name] Tat   Encounter Date: 03/12/2017      OT End of Session - 03/12/17 1321    Visit Number 4   Number of Visits 17   Date for OT Re-Evaluation 04/16/17   Authorization Type Medicare/[employer], G-code needed   Authorization - Visit Number 4   Authorization - Number of Visits 10   OT Start Time 1319   OT Stop Time 1400   OT Time Calculation (min) 41 min   Activity Tolerance Patient tolerated treatment well   Behavior During Therapy [facility] for tasks assessed/performed      Past Medical History:  Diagnosis Date  . Abnormal vaginal Pap smear   . Acute pharyngitis 02/25/2013  . Anemia   . Anxiety   . Arthritis   . 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 Chicken pox as a child  . Chronic UTI    sees dr Terance Hart  . Constipation 12/07/2015  . Depression with anxiety 11/02/2009   Qualifier: Diagnosis of  By: [name], LPN, [name]   . Dermatitis 07/17/2012  . Esophageal stricture 1994  . Fibroids   . Foot pain, bilateral 06/19/2012  . GERD (gastroesophageal reflux disease)   . GERD (gastroesophageal reflux disease)   . Hiatal hernia   . Hyperglycemia 08/19/2013  . 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 Hyperlipidemia   . Hypertension   . Infertility, female   . Measles as a child  . Obesity   . Osteoarthritis   . Parkinson disease ([place])   . Plantar fasciitis of left foot 06/19/2012  . Preventative health care 12/19/2015  . Rosacea 10/05/2014  . Swallowing difficulty   . Urinary frequency 02/25/2013  . Visual floaters 05/11/2014    Past Surgical History:  Procedure Laterality Date  . ABDOMINAL HYSTERECTOMY  2006   total  . esophageal     stretching  .  laporoscopy    . PARTIAL HIP ARTHROPLASTY  2010   right  . TONSILLECTOMY    . wisdom teeth extracted      There were no vitals filed for this visit.      Subjective Assessment - 03/12/17 1320    Subjective  Just came from spin class   Patient Stated Goals fine motor coordination, improve daily activities   Currently in Pain? No/denies          PWR! Hands (basic 4) with min cues For incr movement amplitude.  Added rotating golf balls, twisting nuts/bolts, flipping card between each finger, stacking and manipulating coins to HEP--see pt instructions.  Pt returned demo min difficulty/needed min cueing with each hand (RUE> difficulty than LUE).                         OT Education - 03/12/17 1401    Education Details Updated Coordination HEP (added activities)--see pt instructions for updates   Person(s) Educated Patient   Methods Explanation;Demonstration;Handout   Comprehension Verbalized understanding;Returned demonstration;Verbal cues required          OT Short Term Goals - 02/15/17 1703      OT SHORT TERM GOAL #1   Title Pt will be independent with HEP.--check STGs 03/16/17   Time 4  Period Weeks   Status New     OT SHORT TERM GOAL #2   Title Pt will verbalize understanding of ways to prevent future complications related to PD.   Period Weeks   Status New     OT SHORT TERM GOAL #3   Title Pt will improve balance and functional reaching for IADLs as shown by improving standing functional reach by at least 2" with RUE.   Baseline R-9", L-12"   Time 4   Period Weeks   Status New           OT Long Term Goals - 02/15/17 1710      OT LONG TERM GOAL #1   Title Pt will verbalize understanding of adaptive stratgies to incr. safety/ease/independence with ADLs/IADLs (including eating, doffing jacket, buttoning, putting on make-up, donning shoes/socks, scooping food, opening bottles).--check LTGs 04/16/17   Time 8   Period Weeks   Status New      OT LONG TERM GOAL #2   Title Pt will improve coordination for ADLs as shown by improving time on 9-hole peg test by at least 3sec with R hand.   Baseline R-24.85sec   Period Weeks   Status New     OT LONG TERM GOAL #3   Title Pt will improve functional reaching/coordination for ADLs as shown by improving score on box and blocks test by at least 3blocks with RUE.   Time 8   Period Weeks   Status New     OT LONG TERM GOAL #4   Title Pt will report incr ease with donning R shoe/sock.   Time 8   Period Weeks   Status New     OT LONG TERM GOAL #5   Title Pt will report incr ease with donning pull-over shirt.   Time 8   Period Weeks   Status New               Plan - 03/12/17 1322    Clinical Impression Statement Pt is progressing with improved coordination tasks with min cueing for large amplitude movements, but reports incr effort with RUE.   Rehab Potential Good   OT Frequency 2x / week   OT Duration 8 weeks  +eval    OT Treatment/Interventions Self-care/ADL training;Energy conservation;Manual Therapy;Passive range of motion;Cognitive remediation/compensation;Functional Mobility Training;Neuromuscular education;Cryotherapy;Therapeutic activities;Therapeutic exercise;Therapeutic exercises;Patient/family education;DME and/or AE instruction;Moist Heat   Plan review coordination HEP prn, functional reaching   OT Home Exercise Plan Education provided:   02/27/17  PWR! hands (basic 4), Coordination HEP (card activiites)   Consulted and Agree with Plan of Care Patient      Patient will benefit from skilled therapeutic intervention in order to improve the following deficits and impairments:  Decreased cognition, Decreased mobility, Impaired UE functional use, Decreased knowledge of use of DME, Decreased balance, Decreased activity tolerance, Impaired tone, Improper spinal/pelvic alignment, Decreased coordination (bradykinesia)  Visit Diagnosis: Other symptoms and signs  involving the nervous system  Other symptoms and signs involving the musculoskeletal system  Other lack of coordination  Other abnormalities of gait and mobility  Abnormal posture  Unsteadiness on feet  Tremor    Problem List Patient Active Problem List   Diagnosis Date Noted  . Vitamin D deficiency 12/20/2016  . Prediabetes 12/20/2016  . Morbid obesity ([employer]) 11/27/2016  . Shortness of breath on exertion 11/27/2016  . Parkinson disease ([place]) 11/27/2016  . Preventative health care 12/19/2015  . Constipation 12/07/2015  . BCC (basal cell carcinoma of skin)  10/05/2014  . Rosacea 10/05/2014  . Parkinson's disease ([place]) 05/11/2014  . Visual floaters 05/11/2014  . Paralysis agitans ([place]) 01/08/2014  . Hyperglycemia 08/19/2013  . Urinary frequency 02/25/2013  . Screening for cervical cancer 07/17/2012  . Foot pain, bilateral 06/19/2012  . 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 Obesity 11/12/2009  . Depression with anxiety 11/02/2009  . 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 Lumbar back pain with radiculopathy affecting left lower extremity 10/17/2007  . Essential hypertension 07/05/2007  . Osteoarthritis 07/05/2007  . Hyperlipidemia 06/26/2007  . H/O: iron deficiency anemia 05/08/2007  . GERD 05/08/2007    [facility] 03/12/2017, 4:58 PM  [facility] [address] [place], [place], [address] Phone: [phone]   Fax:  [phone]  Name: Kristin [name] MRN: [id number] Date of Birth: 09-20-1950   Vianne Bulls, OTR/L [facility] [address] [place],   [address] [phone] phone [phone] 03/12/17 4:58 PM

## 2017-03-12 NOTE — Patient Instructions (Signed)
Coordination Exercises  Perform the following exercises for 20 minutes at least 4 times a week. Perform with both hand(s). Perform using big movements.   Flipping Cards: Place deck of cards on the table. Flip cards over by opening your hand big to grasp and then turn your palm up big, opening hand fully to release.  Deal cards: Hold 1/2 or whole deck in your hand. Use thumb to push card off top of deck with one big push.  Place card on tabletop. Then flick fingers (extend fingers) powerfully to slide card off table (can have chair/box below table to catch the cards).  Flip card between each finger  Rotate ball with fingertips: Pick up with fingers/thumb and move as much as you can with each turn/movement (clockwise and counter-clockwise).  Toss ball from one hand to the other: Toss big/high.  Deliberately open with toss and deliberately close hand after catch.  Toss ball in the air and catch with the same hand: Toss big/high.  Deliberately open with toss and deliberately close hand after catch.  Rotate 2 golf balls in your hand: Both directions.  Pick up coins and place in coin bank or container: Pick up with big, intentional movements. Do not drag coin to the edge.  Pick up coins and stack one at a time: Pick up with big, intentional movements. Do not drag coin to the edge. (5-10 in a stack)  Pick up 5-10 coins one at a time and hold in palm. Then, move coins from palm to fingertips one at time and place in coin bank/container.  Pick up 5-10 coins one at a time and hold in palm. Then, move coins from palm to fingertips one at a time to stack.  Practice writing: Slow down, write big, and focus on forming each letter.  Practice typing.  Fasten nuts/bolts or put on bottle caps: Turn as much/as big as you can with each turn.  Move wrist.

## 2017-03-14 DIAGNOSIS — M1712 Unilateral primary osteoarthritis, left knee: Secondary | ICD-10-CM | POA: Diagnosis not present

## 2017-03-16 ENCOUNTER — Ambulatory Visit: Payer: Medicare Other | Admitting: Occupational Therapy

## 2017-03-16 DIAGNOSIS — R2681 Unsteadiness on feet: Secondary | ICD-10-CM

## 2017-03-16 DIAGNOSIS — R41840 Attention and concentration deficit: Secondary | ICD-10-CM

## 2017-03-16 DIAGNOSIS — R2689 Other abnormalities of gait and mobility: Secondary | ICD-10-CM | POA: Diagnosis not present

## 2017-03-16 DIAGNOSIS — R29818 Other symptoms and signs involving the nervous system: Secondary | ICD-10-CM

## 2017-03-16 DIAGNOSIS — R29898 Other symptoms and signs involving the musculoskeletal system: Secondary | ICD-10-CM

## 2017-03-16 DIAGNOSIS — R293 Abnormal posture: Secondary | ICD-10-CM | POA: Diagnosis not present

## 2017-03-16 DIAGNOSIS — R278 Other lack of coordination: Secondary | ICD-10-CM | POA: Diagnosis not present

## 2017-03-16 DIAGNOSIS — R251 Tremor, unspecified: Secondary | ICD-10-CM

## 2017-03-16 NOTE — Therapy (Signed)
[facility] [address] [place], [place], [address] Phone: [phone]   Fax:  [phone]  Occupational Therapy Treatment  Patient Details  Name: Kristin [name] MRN: [id number] Date of Birth: 02/26/1951 Referring Provider: Dr. [name] Tat   Encounter Date: 03/16/2017      OT End of Session - 03/16/17 1721    Visit Number 5   Number of Visits 17   Date for OT Re-Evaluation 04/16/17   Authorization Type Medicare/[employer], G-code needed   Authorization - Visit Number 5   Authorization - Number of Visits 10   OT Start Time 1534   OT Stop Time 1618   OT Time Calculation (min) 44 min   Activity Tolerance Patient tolerated treatment well   Behavior During Therapy [facility] for tasks assessed/performed      Past Medical History:  Diagnosis Date  . Abnormal vaginal Pap smear   . Acute pharyngitis 02/25/2013  . Anemia   . Anxiety   . Arthritis   . 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 Chicken pox as a child  . Chronic UTI    sees dr Terance Hart  . Constipation 12/07/2015  . Depression with anxiety 11/02/2009   Qualifier: Diagnosis of  By: [name], LPN, [name]   . Dermatitis 07/17/2012  . Esophageal stricture 1994  . Fibroids   . Foot pain, bilateral 06/19/2012  . GERD (gastroesophageal reflux disease)   . GERD (gastroesophageal reflux disease)   . Hiatal hernia   . Hyperglycemia 08/19/2013  . 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 Hyperlipidemia   . Hypertension   . Infertility, female   . Measles as a child  . Obesity   . Osteoarthritis   . Parkinson disease ([place])   . Plantar fasciitis of left foot 06/19/2012  . Preventative health care 12/19/2015  . Rosacea 10/05/2014  . Swallowing difficulty   . Urinary frequency 02/25/2013  . Visual floaters 05/11/2014    Past Surgical History:  Procedure Laterality Date  . ABDOMINAL HYSTERECTOMY  2006   total  . esophageal     stretching  .  laporoscopy    . PARTIAL HIP ARTHROPLASTY  2010   right  . TONSILLECTOMY    . wisdom teeth extracted      There were no vitals filed for this visit.      Subjective Assessment - 03/16/17 1542    Subjective  this is a lot to think about.  Pt reports that L knee pain showed arthritis in back on knee but not in joint   Pertinent History Parkinson's disease; see Epic   Patient Stated Goals fine motor coordination, improve daily activities   Currently in Pain? No/denies         Pt instructed in ways to prevent future complications including importance of head/eye movements, trunk rotation, and wt. Shift with large amplitude movements to incr ease with ADLs, slow progression, and prevent shoulder pain, improve eye coordination and decr rigidity/bradykinesia in neck/trunk.  Pt verbalized understanding.     In sitting, functional reaching in diagonal pattern incorporating trunk rotation/wt. shift and PWR! reach to toss scarves using PWR! Reach with PWR! Hands up and step with min cueing for large amplitude movements with each UE.  In standing, functional reaching in diagonal pattern incorporating trunk rotation/wt. shift and PWR! Reach behind, to the floor, and overhead to grasp/release cylinder objects using PWR! Hands with min cueingwith min cueing for incr movement  amplitude.  Then reaching for items from the floor using large amplitude movement strategy (after instruction)--feet staggered and apart bending at the knees with min cueing initially.     In standing, wt. Shift forward/back to toss/catch scarves with each UE (each LE in front) with min cueing for large amplitude and incr wt. Shift.  Tossing 2 scarves at once for simple divided attention.                       OT Short Term Goals - 02/15/17 1703      OT SHORT TERM GOAL #1   Title Pt will be independent with HEP.--check STGs 03/16/17   Time 4   Period Weeks   Status New     OT SHORT TERM GOAL #2   Title  Pt will verbalize understanding of ways to prevent future complications related to PD.   Period Weeks   Status New     OT SHORT TERM GOAL #3   Title Pt will improve balance and functional reaching for IADLs as shown by improving standing functional reach by at least 2" with RUE.   Baseline R-9", L-12"   Time 4   Period Weeks   Status New           OT Long Term Goals - 02/15/17 1710      OT LONG TERM GOAL #1   Title Pt will verbalize understanding of adaptive stratgies to incr. safety/ease/independence with ADLs/IADLs (including eating, doffing jacket, buttoning, putting on make-up, donning shoes/socks, scooping food, opening bottles).--check LTGs 04/16/17   Time 8   Period Weeks   Status New     OT LONG TERM GOAL #2   Title Pt will improve coordination for ADLs as shown by improving time on 9-hole peg test by at least 3sec with R hand.   Baseline R-24.85sec   Period Weeks   Status New     OT LONG TERM GOAL #3   Title Pt will improve functional reaching/coordination for ADLs as shown by improving score on box and blocks test by at least 3blocks with RUE.   Time 8   Period Weeks   Status New     OT LONG TERM GOAL #4   Title Pt will report incr ease with donning R shoe/sock.   Time 8   Period Weeks   Status New     OT LONG TERM GOAL #5   Title Pt will report incr ease with donning pull-over shirt.   Time 8   Period Weeks   Status New               Plan - 03/16/17 1722    Clinical Impression Statement Pt is progressing towards goals with improving movement amplitude and awareness of movement.   Rehab Potential Good   OT Frequency 2x / week   OT Duration 8 weeks  +eval    OT Treatment/Interventions Self-care/ADL training;Energy conservation;Manual Therapy;Passive range of motion;Cognitive remediation/compensation;Functional Mobility Training;Neuromuscular education;Cryotherapy;Therapeutic activities;Therapeutic exercise;Therapeutic exercises;Patient/family  education;DME and/or AE instruction;Moist Heat   Plan functional reaching, large amplitude movements for ADLs; check STGs   OT Home Exercise Plan Education provided:   02/27/17  PWR! hands (basic 4), Coordination HEP (card activiites)   Consulted and Agree with Plan of Care Patient      Patient will benefit from skilled therapeutic intervention in order to improve the following deficits and impairments:  Decreased cognition, Decreased mobility, Impaired UE functional use, Decreased knowledge of use  of DME, Decreased balance, Decreased activity tolerance, Impaired tone, Improper spinal/pelvic alignment, Decreased coordination (bradykinesia)  Visit Diagnosis: Other symptoms and signs involving the nervous system  Other symptoms and signs involving the musculoskeletal system  Other lack of coordination  Other abnormalities of gait and mobility  Abnormal posture  Unsteadiness on feet  Tremor  Attention and concentration deficit    Problem List Patient Active Problem List   Diagnosis Date Noted  . Vitamin D deficiency 12/20/2016  . Prediabetes 12/20/2016  . Morbid obesity ([place]) 11/27/2016  . Shortness of breath on exertion 11/27/2016  . Parkinson disease ([place]) 11/27/2016  . Preventative health care 12/19/2015  . Constipation 12/07/2015  . BCC (basal cell carcinoma of skin) 10/05/2014  . Rosacea 10/05/2014  . Parkinson's disease ([place]) 05/11/2014  . Visual floaters 05/11/2014  . Paralysis agitans ([place]) 01/08/2014  . Hyperglycemia 08/19/2013  . Urinary frequency 02/25/2013  . Screening for cervical cancer 07/17/2012  . Foot pain, bilateral 06/19/2012  . 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 Obesity 11/12/2009  . Depression with anxiety 11/02/2009  . 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 Lumbar back pain with radiculopathy affecting left lower extremity 10/17/2007  . Essential hypertension 07/05/2007  . Osteoarthritis 07/05/2007  . Hyperlipidemia 06/26/2007  .  H/O: iron deficiency anemia 05/08/2007  . GERD 05/08/2007    [facility] 03/16/2017, 5:23 PM  [facility] [address] [place], [place], [address] Phone: [phone]   Fax:  [phone]  Name: Kristin [name] MRN: [id number] Date of Birth: [birth year]   Vianne Bulls, OTR/L [facility] [address] [place], [place]  [address] [phone] phone [phone] 03/16/17 5:30 PM

## 2017-03-19 ENCOUNTER — Ambulatory Visit: Payer: Medicare Other | Admitting: Occupational Therapy

## 2017-03-19 DIAGNOSIS — R293 Abnormal posture: Secondary | ICD-10-CM

## 2017-03-19 DIAGNOSIS — R2689 Other abnormalities of gait and mobility: Secondary | ICD-10-CM | POA: Diagnosis not present

## 2017-03-19 DIAGNOSIS — R29818 Other symptoms and signs involving the nervous system: Secondary | ICD-10-CM | POA: Diagnosis not present

## 2017-03-19 DIAGNOSIS — R2681 Unsteadiness on feet: Secondary | ICD-10-CM

## 2017-03-19 DIAGNOSIS — R278 Other lack of coordination: Secondary | ICD-10-CM | POA: Diagnosis not present

## 2017-03-19 DIAGNOSIS — R29898 Other symptoms and signs involving the musculoskeletal system: Secondary | ICD-10-CM | POA: Diagnosis not present

## 2017-03-19 DIAGNOSIS — R251 Tremor, unspecified: Secondary | ICD-10-CM

## 2017-03-19 DIAGNOSIS — R41840 Attention and concentration deficit: Secondary | ICD-10-CM

## 2017-03-19 NOTE — Therapy (Signed)
[facility] [address] [place], [place], [address] Phone: [phone]   Fax:  [phone]  Occupational Therapy Treatment  Patient Details  Name: Kristin [name] MRN: [id number] Date of Birth: [birth year] Referring Provider: Dr. [name] Tat   Encounter Date: 03/19/2017      OT End of Session - 03/19/17 1322    Visit Number 6   Number of Visits 17   Date for OT Re-Evaluation 04/16/17   Authorization Type Medicare/[employer], G-code needed   Authorization - Visit Number 6   Authorization - Number of Visits 10   OT Start Time 1320   OT Stop Time 1403   OT Time Calculation (min) 43 min   Activity Tolerance Patient tolerated treatment well   Behavior During Therapy [facility] for tasks assessed/performed      Past Medical History:  Diagnosis Date  . Abnormal vaginal Pap smear   . Acute pharyngitis 02/25/2013  . Anemia   . Anxiety   . Arthritis   . 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 Chicken pox as a child  . Chronic UTI    sees dr Terance Hart  . Constipation 12/07/2015  . Depression with anxiety 11/02/2009   Qualifier: Diagnosis of  By: [name], LPN, [name]   . Dermatitis 07/17/2012  . Esophageal stricture 1994  . Fibroids   . Foot pain, bilateral 06/19/2012  . GERD (gastroesophageal reflux disease)   . GERD (gastroesophageal reflux disease)   . Hiatal hernia   . Hyperglycemia 08/19/2013  . 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 Hyperlipidemia   . Hypertension   . Infertility, female   . Measles as a child  . Obesity   . Osteoarthritis   . Parkinson disease ([place])   . Plantar fasciitis of left foot 06/19/2012  . Preventative health care 12/19/2015  . Rosacea 10/05/2014  . Swallowing difficulty   . Urinary frequency 02/25/2013  . Visual floaters 05/11/2014    Past Surgical History:  Procedure Laterality Date  . ABDOMINAL HYSTERECTOMY  2006   total  . esophageal     stretching  .  laporoscopy    . PARTIAL HIP ARTHROPLASTY  2010   right  . TONSILLECTOMY    . wisdom teeth extracted      There were no vitals filed for this visit.      Subjective Assessment - 03/19/17 1321    Subjective  Came from spin   Pertinent History Parkinson's disease; see Epic   Patient Stated Goals fine motor coordination, improve daily activities   Currently in Pain? No/denies      Dressing:   Simulated ADLs with bag with focus/min cues for large amplitude movements:  Donning/doffing pull-over shirt, donning/doffing pants (mod difficulty, particularly with LLE), pulling shirt down in back/drying back.  Then practiced donning pull-over shirt using adaptive strategies incorporating large amplitude movements (put arms in sleeves, gather shirt and pull over head with big movement and good posture).  Practiced using large amplitude movement strategies to don sock after initial instruction.  Pt returned demo/verbalized understanding.  Stretching in prep for LB dressing and to simulate LB dressing:  Reach to the floor with BUEs, lifting foot while leaning forward to touch toe (cross body) for incr core stability and stretch with min cueing.    Opening/closing bottles with use of large amplitude movement strategies with min cueing with each UE turning.  OT Short Term Goals - 03/19/17 1558      OT SHORT TERM GOAL #1   Title Pt will be independent with HEP.--check STGs 03/16/17   Time 4   Period Weeks   Status On-going  03/19/17  instructed, but would benefit from review     OT SHORT TERM GOAL #2   Title Pt will verbalize understanding of ways to prevent future complications related to PD.   Period Weeks   Status On-going     OT SHORT TERM GOAL #3   Title Pt will improve balance and functional reaching for IADLs as shown by improving standing functional reach by at least 2" with RUE.   Baseline R-9", L-12"   Time 4   Period  Weeks   Status New           OT Long Term Goals - 02/15/17 1710      OT LONG TERM GOAL #1   Title Pt will verbalize understanding of adaptive stratgies to incr. safety/ease/independence with ADLs/IADLs (including eating, doffing jacket, buttoning, putting on make-up, donning shoes/socks, scooping food, opening bottles).--check LTGs 04/16/17   Time 8   Period Weeks   Status New     OT LONG TERM GOAL #2   Title Pt will improve coordination for ADLs as shown by improving time on 9-hole peg test by at least 3sec with R hand.   Baseline R-24.85sec   Period Weeks   Status New     OT LONG TERM GOAL #3   Title Pt will improve functional reaching/coordination for ADLs as shown by improving score on box and blocks test by at least 3blocks with RUE.   Time 8   Period Weeks   Status New     OT LONG TERM GOAL #4   Title Pt will report incr ease with donning R shoe/sock.   Time 8   Period Weeks   Status New     OT LONG TERM GOAL #5   Title Pt will report incr ease with donning pull-over shirt.   Time 8   Period Weeks   Status New               Plan - 03/19/17 1556    Clinical Impression Statement Pt is progressing towards goals with improving movement amplitude and awareness of movement.  Pt verbalized understanding of adaptive strategies for functional tasks.   Rehab Potential Good   OT Frequency 2x / week   OT Duration 8 weeks  +eval    Plan continue checking STGs, large amplitude movement strategies for eating, buttoning, donning/doffing jacket   OT Home Exercise Plan Education provided:   02/27/17  PWR! hands (basic 4), Coordination HEP (card activiites)   Consulted and Agree with Plan of Care Patient      Patient will benefit from skilled therapeutic intervention in order to improve the following deficits and impairments:  Decreased cognition, Decreased mobility, Impaired UE functional use, Decreased knowledge of use of DME, Decreased balance, Decreased activity  tolerance, Impaired tone, Improper spinal/pelvic alignment, Decreased coordination (bradykinesia)  Visit Diagnosis: Other symptoms and signs involving the nervous system  Other symptoms and signs involving the musculoskeletal system  Other lack of coordination  Other abnormalities of gait and mobility  Abnormal posture  Unsteadiness on feet  Tremor  Attention and concentration deficit    Problem List Patient Active Problem List   Diagnosis Date Noted  . Vitamin D deficiency 12/20/2016  . Prediabetes 12/20/2016  . Morbid obesity (  [place]) 11/27/2016  . Shortness of breath on exertion 11/27/2016  . Parkinson disease ([place]) 11/27/2016  . Preventative health care 12/19/2015  . Constipation 12/07/2015  . BCC (basal cell carcinoma of skin) 10/05/2014  . Rosacea 10/05/2014  . Parkinson's disease ([place]) 05/11/2014  . Visual floaters 05/11/2014  . Paralysis agitans ([place]) 01/08/2014  . Hyperglycemia 08/19/2013  . Urinary frequency 02/25/2013  . Screening for cervical cancer 07/17/2012  . Foot pain, bilateral 06/19/2012  . 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 Obesity 11/12/2009  . Depression with anxiety 11/02/2009  . 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 Lumbar back pain with radiculopathy affecting left lower extremity 10/17/2007  . Essential hypertension 07/05/2007  . Osteoarthritis 07/05/2007  . Hyperlipidemia 06/26/2007  . H/O: iron deficiency anemia 05/08/2007  . GERD 05/08/2007    [facility] 03/19/2017, 4:01 PM  [facility] [address] [place], [place], [address] Phone: [phone]   Fax:  [phone]  Name: Kristin [name] MRN: [id number] Date of Birth: 1951-04-14   Vianne Bulls, OTR/L [facility] [address] [place], [place]  [address] [phone] phone [phone] 03/19/17 4:01 PM

## 2017-03-20 ENCOUNTER — Ambulatory Visit (INDEPENDENT_AMBULATORY_CARE_PROVIDER_SITE_OTHER): Payer: Medicare Other | Admitting: Family Medicine

## 2017-03-20 VITALS — BP 100/67 | HR 71 | Temp 98.1°F | Ht 64.0 in | Wt 236.0 lb

## 2017-03-20 DIAGNOSIS — IMO0001 Reserved for inherently not codable concepts without codable children: Secondary | ICD-10-CM

## 2017-03-20 DIAGNOSIS — E559 Vitamin D deficiency, unspecified: Secondary | ICD-10-CM

## 2017-03-20 DIAGNOSIS — F3289 Other specified depressive episodes: Secondary | ICD-10-CM | POA: Diagnosis not present

## 2017-03-20 DIAGNOSIS — Z6841 Body Mass Index (BMI) 40.0 and over, adult: Secondary | ICD-10-CM

## 2017-03-20 DIAGNOSIS — E669 Obesity, unspecified: Secondary | ICD-10-CM

## 2017-03-20 MED ORDER — VITAMIN D (ERGOCALCIFEROL) 1.25 MG (50000 UNIT) PO CAPS: 50000.0000 [IU] | ORAL_CAPSULE | ORAL | 0 refills | Status: DC

## 2017-03-21 NOTE — Progress Notes (Signed)
Office: [phone]  /  Fax: [phone]   HPI:   Chief Complaint: OBESITY Kristin [name] is here to discuss her progress with her obesity treatment plan. She is on the  follow the Category 2 plan and is following her eating plan approximately 50 % of the time. She states she is exercising 60 minutes 5 times per week. Dellia continues to do well with weight loss. Has not been planning ahead as well and has felt less motivated at times. She is ready to get back on track. Her weight is 236 lb (107 kg) today and has had a weight loss of 2 pounds over a period of 2 weeks since her last visit. She has lost 8 lbs since starting treatment with [place].  Vitamin D deficiency Kristin [name] has a diagnosis of vitamin D deficiency. She is currently taking vit D and denies nausea, vomiting or muscle weakness.  Depression with emotional eating behaviors Kristin [name] is struggling with emotional eating and using food for comfort to the extent that it is negatively impacting her health. She often snacks when she is not hungry. Kristin [name] sometimes feels she is out of control and then feels guilty that she made poor food choices. She has been working on behavior modification techniques to help reduce her emotional eating and has been somewhat successful. She shows no sign of suicidal or homicidal ideations.  Depression screen Kristin [name] 2/9 11/27/2016 12/07/2015  Decreased Interest 1 0  Down, Depressed, Hopeless 3 0  PHQ - 2 Score 4 0  Altered sleeping 0 -  Tired, decreased energy 2 -  Change in appetite 3 -  Feeling bad or failure about yourself  3 -  Trouble concentrating 1 -  Moving slowly or fidgety/restless 0 -  Suicidal thoughts 0 -  PHQ-9 Score 13 -     ALLERGIES: Allergies  Allergen Reactions  . Bactrim [Sulfamethoxazole-Trimethoprim] Other (See Comments)    Oral ulcers and rash  . Penicillins Hives    MEDICATIONS: Current Outpatient Prescriptions on File Prior to Visit  Medication Sig Dispense Refill  . ALPRAZolam (XANAX)  0.5 MG tablet Take 1 tablet (0.5 mg total) by mouth as needed for anxiety or sleep. 30 tablet 1  . carbidopa-levodopa (SINEMET CR) 50-200 MG tablet Take 1 tablet by mouth at bedtime. 90 tablet 1  . carbidopa-levodopa (SINEMET IR) 25-100 MG tablet take 1 tablet by mouth three times a day 90 tablet 5  . DULoxetine (CYMBALTA) 30 MG capsule take 1 capsule by mouth once daily 90 capsule 1  . escitalopram (LEXAPRO) 20 MG tablet take 1 tablet by mouth once daily 90 tablet 2  . omeprazole (PRILOSEC) 20 MG capsule take 1 capsule by mouth once daily 90 capsule 1  . polyethylene glycol powder (GLYCOLAX/MIRALAX) powder Take 17 g by mouth 2 (two) times daily as needed (1 to 2 times per day, as needed). 850 g 0  . pramipexole (MIRAPEX) 0.5 MG tablet TAKE 2 TABLETS BY MOUTH IN THE MORNING 1 TABLET IN THE AFTERNOON AND 1 TABLET IN THE EVENING 360 tablet 3  . rosuvastatin (CRESTOR) 10 MG tablet TAKE 1 TABLET BY MOUTH ON MONDAYS, WEDNESDAYS, AND FRIDAYS 90 tablet 0  . telmisartan-hydrochlorothiazide (MICARDIS HCT) 80-12.5 MG tablet TAKE 1 TABLET BY MOUTH DAILY (Patient taking differently: Take 0.5 tablets by mouth daily. ) 90 tablet 1   No current facility-administered medications on file prior to visit.     PAST MEDICAL HISTORY: Past Medical History:  Diagnosis Date  . Abnormal vaginal Pap  smear   . Acute pharyngitis 02/25/2013  . Anemia   . Anxiety   . Arthritis   . 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 Chicken pox as a child  . Chronic UTI    sees dr Terance Hart  . Constipation 12/07/2015  . Depression with anxiety 11/02/2009   Qualifier: Diagnosis of  By: [name], LPN, [name]   . Dermatitis 07/17/2012  . Esophageal stricture 1994  . Fibroids   . Foot pain, bilateral 06/19/2012  . GERD (gastroesophageal reflux disease)   . GERD (gastroesophageal reflux disease)   . Hiatal hernia   . Hyperglycemia 08/19/2013  . 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 Hyperlipidemia   . Hypertension    . Infertility, female   . Measles as a child  . Obesity   . Osteoarthritis   . Parkinson disease ([place])   . Plantar fasciitis of left foot 06/19/2012  . Preventative health care 12/19/2015  . Rosacea 10/05/2014  . Swallowing difficulty   . Urinary frequency 02/25/2013  . Visual floaters 05/11/2014    PAST SURGICAL HISTORY: Past Surgical History:  Procedure Laterality Date  . ABDOMINAL HYSTERECTOMY  2006   total  . esophageal     stretching  . laporoscopy    . PARTIAL HIP ARTHROPLASTY  2010   right  . TONSILLECTOMY    . wisdom teeth extracted      SOCIAL HISTORY: Social History  Substance Use Topics  . Smoking status: Never Smoker  . Smokeless tobacco: Never Used  . Alcohol use 2.4 oz/week    4 Glasses of wine per week     Comment: 4 glasses of wine weekly    FAMILY HISTORY: Family History  Problem Relation Age of Onset  . Cancer Mother        breast  . Other Mother        arrythmia  . Mental illness Mother        bipolar  . Hyperlipidemia Mother   . Thyroid disease Mother   . Depression Mother   . Bipolar disorder Mother   . Heart disease Father   . Arthritis Father        rheumatoid  . Hypertension Father   . Hyperlipidemia Father   . Depression Sister   . Mental illness Sister        bipolar  . Parkinson's disease Sister   . Arthritis Sister   . Colon cancer Neg Hx   . Esophageal cancer Neg Hx   . Rectal cancer Neg Hx   . Stomach cancer Neg Hx     ROS: Review of Systems  Constitutional: Positive for weight loss.  Gastrointestinal: Negative for nausea and vomiting.  Musculoskeletal:       Negative muscle weakness  Psychiatric/Behavioral: Positive for depression. Negative for suicidal ideas.    PHYSICAL EXAM: Blood pressure 100/67, pulse 71, temperature 98.1 F (36.7 C), temperature source Oral, height 5\' 4"  (1.626 m), weight 236 lb (107 kg), SpO2 96 %. Body mass index is 40.51 kg/m. Physical Exam  Constitutional: She is oriented to person,  place, and time. She appears well-developed and well-nourished.  Cardiovascular: Normal rate.   Pulmonary/Chest: Effort normal.  Neurological: She is alert and oriented to person, place, and time.  Skin: Skin is warm and dry.  Psychiatric: She has a normal mood and affect.    RECENT LABS AND TESTS: BMET    Component Value Date/Time   NA 141 11/27/2016 1123  K 4.4 11/27/2016 1123   CL 102 11/27/2016 1123   CO2 26 11/27/2016 1123   GLUCOSE 87 11/27/2016 1123   GLUCOSE 97 11/03/2016 1030   BUN 13 11/27/2016 1123   CREATININE 0.75 11/27/2016 1123   CREATININE 0.71 01/04/2015 1046   CALCIUM 9.2 11/27/2016 1123   GFRNONAA 83 11/27/2016 1123   GFRNONAA >89 01/04/2015 1046   GFRAA 96 11/27/2016 1123   GFRAA >89 01/04/2015 1046   Lab Results  Component Value Date   HGBA1C 6.1 (H) 11/27/2016   HGBA1C 6.2 11/03/2016   HGBA1C 6.0 05/04/2016   HGBA1C 6.1 12/07/2015   HGBA1C 5.9 01/04/2015   Lab Results  Component Value Date   INSULIN 18.0 11/27/2016   CBC    Component Value Date/Time   WBC 5.5 11/27/2016 1123   WBC 6.1 11/03/2016 1030   RBC 4.31 11/27/2016 1123   RBC 4.31 11/03/2016 1030   HGB 12.2 11/27/2016 1123   HCT 38.3 11/27/2016 1123   PLT 243.0 11/03/2016 1030   MCV 89 11/27/2016 1123   MCH 28.3 11/27/2016 1123   MCH 31.6 04/07/2013 0923   MCHC 31.9 11/27/2016 1123   MCHC 32.1 11/03/2016 1030   RDW 15.3 11/27/2016 1123   LYMPHSABS 1.8 11/27/2016 1123   EOSABS 0.1 11/27/2016 1123   BASOSABS 0.0 11/27/2016 1123   Iron/TIBC/Ferritin/ %Sat    Component Value Date/Time   IRON 31 03/05/2017 1224   TIBC 402 03/05/2017 1224   FERRITIN 12 (L) 03/05/2017 1224   IRONPCTSAT 8 (LL) 03/05/2017 1224   Lipid Panel     Component Value Date/Time   CHOL 180 11/27/2016 1123   TRIG 175 (H) 11/27/2016 1123   HDL 57 11/27/2016 1123   CHOLHDL 3 11/03/2016 1030   VLDL 20.6 11/03/2016 1030   LDLCALC 88 11/27/2016 1123   LDLDIRECT 161.5 10/07/2008 0944   Hepatic  Function Panel     Component Value Date/Time   PROT 6.9 11/27/2016 1123   ALBUMIN 4.3 11/27/2016 1123   AST 23 11/27/2016 1123   ALT 9 11/27/2016 1123   ALKPHOS 79 11/27/2016 1123   BILITOT 0.4 11/27/2016 1123   BILIDIR 0.1 05/05/2014 0948   IBILI 0.4 04/07/2013 0923      Component Value Date/Time   TSH 1.480 11/27/2016 1123   TSH 0.87 11/03/2016 1030   TSH 1.13 05/04/2016 0935    ASSESSMENT AND PLAN: Vitamin D deficiency - Plan: Vitamin D, Ergocalciferol, (DRISDOL) 50000 units CAPS capsule  Other depression  Class 3 obesity with serious comorbidity and body mass index (BMI) of 40.0 to 44.9 in adult, unspecified obesity type (HCC)  PLAN:  Vitamin D Deficiency Kristin [name] was informed that low vitamin D levels contributes to fatigue and are associated with obesity, breast, and colon cancer. She agrees to continue to take prescription Vit D @50 ,000 IU every week, refill was written today, and will follow up for routine testing of vitamin D at next visit, at least 2-3 times per year. She was informed of the risk of over-replacement of vitamin D and agrees to not increase her dose unless he discusses this with [place] first.  Depression with Emotional Eating Behaviors We discussed behavior modification techniques today to help Kristin [name] deal with her emotional eating and depression. She has agreed to take Wellbutrin SR 150 mg qd and agreed to follow up as directed.   Obesity Kristin [name] is currently in the action stage of change. As such, her goal is to continue with weight loss efforts She has  agreed to follow the Category 2 plan Kristin [name] has been instructed to work up to a goal of 150 minutes of combined cardio and strengthening exercise per week for weight loss and overall health benefits. We discussed the following Behavioral Modification Stratagies today: increasing lean protein intake and keeping healthy foods in the home.    Office: [phone]  /  Fax: [phone]  OBESITY BEHAVIORAL  INTERVENTION VISIT  Today's visit was # 7 out of 22.  Starting weight: 244 Starting date: 11/27/16 Today's weight : Weight: 236 lb (107 kg)  Today's date: 03/21/2017 Total lbs lost to date: 8 (Patients must lose 7 lbs in the first 6 months to continue with counseling)   ASK: We discussed the diagnosis of obesity with Kristin [name] today and Kristin [name] agreed to give [place] permission to discuss obesity behavioral modification therapy today.  ASSESS: Satara has the diagnosis of obesity and her BMI today is 57.6 Kristin [name] is in the action stage of change   ADVISE: Kristin [name] was educated on the multiple health risks of obesity as well as the benefit of weight loss to improve her health. She was advised of the need for long term treatment and the importance of lifestyle modifications.  AGREE: Multiple dietary modification options and treatment options were discussed and  Kristin [name] agreed to follow the Category 2 plan We discussed the following Behavioral Modification Stratagies today: increasing lean protein intake and keeping healthy foods in the home.    Kristin [name] has agreed to follow up with our clinic in 2 weeks. She was informed of the importance of frequent follow up visits to maximize her success with intensive lifestyle modifications for her multiple health conditions.  Kristin [name] PAC, am acting as Location manager for Dennard Nip, MD  I have reviewed the above documentation for accuracy and completeness, and I agree with the above. -Dennard Nip, MD

## 2017-03-22 ENCOUNTER — Ambulatory Visit: Payer: Medicare Other | Admitting: Occupational Therapy

## 2017-03-22 DIAGNOSIS — R2689 Other abnormalities of gait and mobility: Secondary | ICD-10-CM

## 2017-03-22 DIAGNOSIS — R41840 Attention and concentration deficit: Secondary | ICD-10-CM

## 2017-03-22 DIAGNOSIS — R29818 Other symptoms and signs involving the nervous system: Secondary | ICD-10-CM | POA: Diagnosis not present

## 2017-03-22 DIAGNOSIS — R2681 Unsteadiness on feet: Secondary | ICD-10-CM

## 2017-03-22 DIAGNOSIS — R278 Other lack of coordination: Secondary | ICD-10-CM

## 2017-03-22 DIAGNOSIS — R29898 Other symptoms and signs involving the musculoskeletal system: Secondary | ICD-10-CM | POA: Diagnosis not present

## 2017-03-22 DIAGNOSIS — R293 Abnormal posture: Secondary | ICD-10-CM

## 2017-03-22 DIAGNOSIS — R251 Tremor, unspecified: Secondary | ICD-10-CM

## 2017-03-22 NOTE — Therapy (Signed)
[facility] [address] [place], [place], [address] Phone: [phone]   Fax:  [phone]  Occupational Therapy Treatment  Patient Details  Name: Kristin [name] MRN: [id number] Date of Birth: Dec 28, 1950 Referring Provider: Dr. [name] Tat   Encounter Date: 03/22/2017      OT End of Session - 03/22/17 0853    Visit Number 7   Number of Visits 17   Date for OT Re-Evaluation 04/16/17   Authorization Type Medicare/[employer], G-code needed   Authorization - Visit Number 7   Authorization - Number of Visits 10   OT Start Time [phone]   OT Stop Time 0930   OT Time Calculation (min) 39 min   Activity Tolerance Patient tolerated treatment well   Behavior During Therapy [facility] for tasks assessed/performed      Past Medical History:  Diagnosis Date  . Abnormal vaginal Pap smear   . Acute pharyngitis 02/25/2013  . Anemia   . Anxiety   . Arthritis   . 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 Chicken pox as a child  . Chronic UTI    sees dr Terance Hart  . Constipation 12/07/2015  . Depression with anxiety 11/02/2009   Qualifier: Diagnosis of  By: [name], LPN, [name]   . Dermatitis 07/17/2012  . Esophageal stricture 1994  . Fibroids   . Foot pain, bilateral 06/19/2012  . GERD (gastroesophageal reflux disease)   . GERD (gastroesophageal reflux disease)   . Hiatal hernia   . Hyperglycemia 08/19/2013  . 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 Hyperlipidemia   . Hypertension   . Infertility, female   . Measles as a child  . Obesity   . Osteoarthritis   . Parkinson disease ([place])   . Plantar fasciitis of left foot 06/19/2012  . Preventative health care 12/19/2015  . Rosacea 10/05/2014  . Swallowing difficulty   . Urinary frequency 02/25/2013  . Visual floaters 05/11/2014    Past Surgical History:  Procedure Laterality Date  . ABDOMINAL HYSTERECTOMY  2006   total  . esophageal     stretching  .  laporoscopy    . PARTIAL HIP ARTHROPLASTY  2010   right  . TONSILLECTOMY    . wisdom teeth extracted      There were no vitals filed for this visit.      Subjective Assessment - 03/22/17 0852    Subjective  Pt is going to [place] this weekend which always presents new challenges   Pertinent History Parkinson's disease; see Epic   Patient Stated Goals fine motor coordination, improve daily activities   Currently in Pain? No/denies        Self Care:     Continued  instruction in importance and  in use of large amplitude movements to prevent future complications related to PD.  Pt verbalized understanding.  Discussed coping and adjustment and recommended having a response to others when she tells others about PD.  Also educated pt on how non-motor symptoms relate to coping and adjustment.  Pt verbalized understanding.    Pt reports improvements with use of dressing strategies.  Eating:  Instructed pt in strategies for eating including holding utensil in the middle vs. The end, perpendicular scooping, scooting chair close to table, use of PWR! Hands, use of big intentional movements, and good posture, use of pressure on utensil/cup variations to decr tremors.  Also discussed holding elbows close to body/squeeze elbows against body while  holding/carrying plate/cup.  Also discussed strategies for cutting food with use of large amplitude movements. Pt verbalized understanding.  Reviewed grasp/release of cylinder objects (bottles, cups) with use of PWR! Hands/large amplitude movements.  Practiced carrying cup of water with plate of "simulated food" in each hand using strategies with improved performance per pt and no spills.  Discussed ways to compensate when in eating in public including types of food, cutting food in smaller pieces, drinking soup from a cup, doubling paper plates for incr stability, not filling cups fully, bring set of metal utensils in purse instead of using  provided plastic ones, and being confident in strategies to "normalize" them.  Pt verbalized understanding.                        OT Short Term Goals - 03/22/17 1256      OT SHORT TERM GOAL #1   Title Pt will be independent with HEP.--check STGs 03/16/17   Time 4   Period Weeks   Status On-going  03/19/17  instructed, but would benefit from review     OT SHORT TERM GOAL #2   Title Pt will verbalize understanding of ways to prevent future complications related to PD.   Period Weeks   Status Achieved  03/22/17     OT SHORT TERM GOAL #3   Title Pt will improve balance and functional reaching for IADLs as shown by improving standing functional reach by at least 2" with RUE.   Baseline R-9", L-12"   Time 4   Period Weeks   Status New           OT Long Term Goals - 02/15/17 1710      OT LONG TERM GOAL #1   Title Pt will verbalize understanding of adaptive stratgies to incr. safety/ease/independence with ADLs/IADLs (including eating, doffing jacket, buttoning, putting on make-up, donning shoes/socks, scooping food, opening bottles).--check LTGs 04/16/17   Time 8   Period Weeks   Status New     OT LONG TERM GOAL #2   Title Pt will improve coordination for ADLs as shown by improving time on 9-hole peg test by at least 3sec with R hand.   Baseline R-24.85sec   Period Weeks   Status New     OT LONG TERM GOAL #3   Title Pt will improve functional reaching/coordination for ADLs as shown by improving score on box and blocks test by at least 3blocks with RUE.   Time 8   Period Weeks   Status New     OT LONG TERM GOAL #4   Title Pt will report incr ease with donning R shoe/sock.   Time 8   Period Weeks   Status New     OT LONG TERM GOAL #5   Title Pt will report incr ease with donning pull-over shirt.   Time 8   Period Weeks   Status New               Plan - 03/22/17 1255    Clinical Impression Statement Pt is progressing towards goals and  reports using strategies for ADLs with good success.   Rehab Potential Good   OT Frequency 2x / week   OT Duration 8 weeks  +eval    OT Treatment/Interventions Self-care/ADL training;Energy conservation;Manual Therapy;Passive range of motion;Cognitive remediation/compensation;Functional Mobility Training;Neuromuscular education;Cryotherapy;Therapeutic activities;Therapeutic exercise;Therapeutic exercises;Patient/family education;DME and/or AE instruction;Moist Heat   Plan continue checking STGs, large amplitude movement strategies for buttoning, donning/doffing  jacket   OT Home Exercise Plan Education provided:   02/27/17  PWR! hands (basic 4), Coordination HEP (card activiites)   Consulted and Agree with Plan of Care Patient      Patient will benefit from skilled therapeutic intervention in order to improve the following deficits and impairments:  Decreased cognition, Decreased mobility, Impaired UE functional use, Decreased knowledge of use of DME, Decreased balance, Decreased activity tolerance, Impaired tone, Improper spinal/pelvic alignment, Decreased coordination (bradykinesia)  Visit Diagnosis: Other symptoms and signs involving the nervous system  Other symptoms and signs involving the musculoskeletal system  Other lack of coordination  Other abnormalities of gait and mobility  Abnormal posture  Unsteadiness on feet  Tremor  Attention and concentration deficit    Problem List Patient Active Problem List   Diagnosis Date Noted  . Vitamin D deficiency 12/20/2016  . Prediabetes 12/20/2016  . Morbid obesity ([place]) 11/27/2016  . Shortness of breath on exertion 11/27/2016  . Parkinson disease ([place]) 11/27/2016  . Preventative health care 12/19/2015  . Constipation 12/07/2015  . BCC (basal cell carcinoma of skin) 10/05/2014  . Rosacea 10/05/2014  . Parkinson's disease ([place]) 05/11/2014  . Visual floaters 05/11/2014  . Paralysis agitans ([place]) 01/08/2014  . Hyperglycemia  08/19/2013  . Urinary frequency 02/25/2013  . Screening for cervical cancer 07/17/2012  . Foot pain, bilateral 06/19/2012  . 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 Obesity 11/12/2009  . Depression with anxiety 11/02/2009  . 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 Lumbar back pain with radiculopathy affecting left lower extremity 10/17/2007  . Essential hypertension 07/05/2007  . Osteoarthritis 07/05/2007  . Hyperlipidemia 06/26/2007  . H/O: iron deficiency anemia 05/08/2007  . GERD 05/08/2007    [facility] 03/22/2017, 12:56 PM  [facility] [address] [place], [place], [address] Phone: [phone]   Fax:  [phone]  Name: Kristin [name] MRN: [id number] Date of Birth: [birth year]   Vianne Bulls, OTR/L [facility] [address] [place], [place]  [address] [phone] phone [phone] 03/22/17 12:57 PM

## 2017-03-27 ENCOUNTER — Ambulatory Visit: Payer: Medicare Other | Admitting: Occupational Therapy

## 2017-03-27 DIAGNOSIS — R29898 Other symptoms and signs involving the musculoskeletal system: Secondary | ICD-10-CM | POA: Diagnosis not present

## 2017-03-27 DIAGNOSIS — R2681 Unsteadiness on feet: Secondary | ICD-10-CM

## 2017-03-27 DIAGNOSIS — R293 Abnormal posture: Secondary | ICD-10-CM | POA: Diagnosis not present

## 2017-03-27 DIAGNOSIS — R2689 Other abnormalities of gait and mobility: Secondary | ICD-10-CM

## 2017-03-27 DIAGNOSIS — R278 Other lack of coordination: Secondary | ICD-10-CM | POA: Diagnosis not present

## 2017-03-27 DIAGNOSIS — R29818 Other symptoms and signs involving the nervous system: Secondary | ICD-10-CM | POA: Diagnosis not present

## 2017-03-27 DIAGNOSIS — R251 Tremor, unspecified: Secondary | ICD-10-CM

## 2017-03-27 NOTE — Therapy (Signed)
[facility] [address] [place], [place], [address] Phone: [phone]   Fax:  [phone]  Occupational Therapy Treatment  Patient Details  Name: Kristin [name] MRN: [id number] Date of Birth: [birth year] Referring Provider: Dr. [name] Tat   Encounter Date: 03/27/2017      OT End of Session - 03/27/17 0946    Visit Number 8   Number of Visits 17   Date for OT Re-Evaluation 04/16/17   Authorization Type Medicare/[employer], G-code needed   Authorization - Visit Number 8   Authorization - Number of Visits 10   OT Start Time 0945   OT Stop Time 1023   OT Time Calculation (min) 38 min   Activity Tolerance Patient tolerated treatment well   Behavior During Therapy [facility] for tasks assessed/performed      Past Medical History:  Diagnosis Date  . Abnormal vaginal Pap smear   . Acute pharyngitis 02/25/2013  . Anemia   . Anxiety   . Arthritis   . 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 Chicken pox as a child  . Chronic UTI    sees dr Terance Hart  . Constipation 12/07/2015  . Depression with anxiety 11/02/2009   Qualifier: Diagnosis of  By: [name], LPN, [name]   . Dermatitis 07/17/2012  . Esophageal stricture 1994  . Fibroids   . Foot pain, bilateral 06/19/2012  . GERD (gastroesophageal reflux disease)   . GERD (gastroesophageal reflux disease)   . Hiatal hernia   . Hyperglycemia 08/19/2013  . 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 Hyperlipidemia   . Hypertension   . Infertility, female   . Measles as a child  . Obesity   . Osteoarthritis   . Parkinson disease ([place])   . Plantar fasciitis of left foot 06/19/2012  . Preventative health care 12/19/2015  . Rosacea 10/05/2014  . Swallowing difficulty   . Urinary frequency 02/25/2013  . Visual floaters 05/11/2014    Past Surgical History:  Procedure Laterality Date  . ABDOMINAL HYSTERECTOMY  2006   total  . esophageal     stretching  .  laporoscopy    . PARTIAL HIP ARTHROPLASTY  2010   right  . TONSILLECTOMY    . wisdom teeth extracted      There were no vitals filed for this visit.      Subjective Assessment - 03/27/17 0946    Subjective  Pt reports that some of the eating and dressing strategies that she has tried has helped.   Pertinent History Parkinson's disease; see Epic   Patient Stated Goals fine motor coordination, improve daily activities   Currently in Pain? No/denies        Self Care:    continued instruction in importance and  in use of large amplitude movements to prevent future complications related to PD (specifically shoulder pain).  Pt verbalized understanding  Dressing:   Practiced buttoning/unbuttoning shirt on table top with min cues for use of PWR! Hands prior to buttoning and use of deliberate/large amplitude movements (pull/push for unfastening) after instruction.  Pt demo improvement with repetition and use of large amplitude movements.  Practiced donning/doffing jacket using large amplitude movement strategies after initial instruction.  Pt demo improvement with repetition and use/min cues for large amplitude movements (particularly trunk rotation with doffing).   Functional mobility: Sit>stand with min cues for large amplitude movement technique (forward lean).   Writing:  Discussed writing and pt provided example  of writing in calendar.  Reviewed use of PWR! Hands (pt reports that this helps) and recommended that when pt gets her next calendar or uses sticky notes that she look for ones with lines to provide visual target.  Pt verbalized understanding.  Recommended pt bring new calendar next session for practice.                        OT Education - 03/27/17 1700    Education Details Using big movements with ADLs   Person(s) Educated Patient   Methods Explanation;Demonstration;Verbal cues;Handout   Comprehension Verbalized understanding;Returned  demonstration;Verbal cues required  min cueing          OT Short Term Goals - 03/22/17 1256      OT SHORT TERM GOAL #1   Title Pt will be independent with HEP.--check STGs 03/16/17   Time 4   Period Weeks   Status On-going  03/19/17  instructed, but would benefit from review     OT SHORT TERM GOAL #2   Title Pt will verbalize understanding of ways to prevent future complications related to PD.   Period Weeks   Status Achieved  03/22/17     OT SHORT TERM GOAL #3   Title Pt will improve balance and functional reaching for IADLs as shown by improving standing functional reach by at least 2" with RUE.   Baseline R-9", L-12"   Time 4   Period Weeks   Status New           OT Long Term Goals - 02/15/17 1710      OT LONG TERM GOAL #1   Title Pt will verbalize understanding of adaptive stratgies to incr. safety/ease/independence with ADLs/IADLs (including eating, doffing jacket, buttoning, putting on make-up, donning shoes/socks, scooping food, opening bottles).--check LTGs 04/16/17   Time 8   Period Weeks   Status New     OT LONG TERM GOAL #2   Title Pt will improve coordination for ADLs as shown by improving time on 9-hole peg test by at least 3sec with R hand.   Baseline R-24.85sec   Period Weeks   Status New     OT LONG TERM GOAL #3   Title Pt will improve functional reaching/coordination for ADLs as shown by improving score on box and blocks test by at least 3blocks with RUE.   Time 8   Period Weeks   Status New     OT LONG TERM GOAL #4   Title Pt will report incr ease with donning R shoe/sock.   Time 8   Period Weeks   Status New     OT LONG TERM GOAL #5   Title Pt will report incr ease with donning pull-over shirt.   Time 8   Period Weeks   Status New               Plan - 03/27/17 1659    Clinical Impression Statement Pt continues to progress towards goals and reports good success with ADL strategies and incr movement amplitude.   Rehab Potential  Good   OT Frequency 2x / week   OT Duration 8 weeks  +eval    OT Treatment/Interventions Self-care/ADL training;Energy conservation;Manual Therapy;Passive range of motion;Cognitive remediation/compensation;Functional Mobility Training;Neuromuscular education;Cryotherapy;Therapeutic activities;Therapeutic exercise;Therapeutic exercises;Patient/family education;DME and/or AE instruction;Moist Heat   Plan continue checking STGs (standing functional reach), writing strategies/writing in calendar if pt brings, review HEP   OT Home Exercise Plan Education provided:  02/27/17  PWR! hands (basic 4), Coordination HEP (card activiites)   Consulted and Agree with Plan of Care Patient      Patient will benefit from skilled therapeutic intervention in order to improve the following deficits and impairments:  Decreased cognition, Decreased mobility, Impaired UE functional use, Decreased knowledge of use of DME, Decreased balance, Decreased activity tolerance, Impaired tone, Improper spinal/pelvic alignment, Decreased coordination (bradykinesia)  Visit Diagnosis: Other symptoms and signs involving the nervous system  Other symptoms and signs involving the musculoskeletal system  Other lack of coordination  Other abnormalities of gait and mobility  Abnormal posture  Unsteadiness on feet  Tremor    Problem List Patient Active Problem List   Diagnosis Date Noted  . Vitamin D deficiency 12/20/2016  . Prediabetes 12/20/2016  . Morbid obesity ([place]) 11/27/2016  . Shortness of breath on exertion 11/27/2016  . Parkinson disease ([place]) 11/27/2016  . Preventative health care 12/19/2015  . Constipation 12/07/2015  . BCC (basal cell carcinoma of skin) 10/05/2014  . Rosacea 10/05/2014  . Parkinson's disease ([place]) 05/11/2014  . Visual floaters 05/11/2014  . Paralysis agitans ([place]) 01/08/2014  . Hyperglycemia 08/19/2013  . Urinary frequency 02/25/2013  . Screening for cervical cancer 07/17/2012  .  Foot pain, bilateral 06/19/2012  . 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 Obesity 11/12/2009  . Depression with anxiety 11/02/2009  . 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 Lumbar back pain with radiculopathy affecting left lower extremity 10/17/2007  . Essential hypertension 07/05/2007  . Osteoarthritis 07/05/2007  . Hyperlipidemia 06/26/2007  . H/O: iron deficiency anemia 05/08/2007  . GERD 05/08/2007    [facility] 03/27/2017, 5:02 PM  [facility] [address] [place], [place], [address] Phone: [phone]   Fax:  [phone]  Name: Kristin [name] MRN: [id number] Date of Birth: 12-17-1950   Vianne Bulls, OTR/L [facility] [address] [place], [place]  [address] [phone] phone [phone] 03/27/17 5:02 PM

## 2017-03-29 ENCOUNTER — Ambulatory Visit: Payer: Medicare Other | Admitting: Occupational Therapy

## 2017-03-29 DIAGNOSIS — R278 Other lack of coordination: Secondary | ICD-10-CM

## 2017-03-29 DIAGNOSIS — R2689 Other abnormalities of gait and mobility: Secondary | ICD-10-CM

## 2017-03-29 DIAGNOSIS — R293 Abnormal posture: Secondary | ICD-10-CM

## 2017-03-29 DIAGNOSIS — R2681 Unsteadiness on feet: Secondary | ICD-10-CM

## 2017-03-29 DIAGNOSIS — R29898 Other symptoms and signs involving the musculoskeletal system: Secondary | ICD-10-CM | POA: Diagnosis not present

## 2017-03-29 DIAGNOSIS — R41840 Attention and concentration deficit: Secondary | ICD-10-CM

## 2017-03-29 DIAGNOSIS — R251 Tremor, unspecified: Secondary | ICD-10-CM

## 2017-03-29 DIAGNOSIS — R29818 Other symptoms and signs involving the nervous system: Secondary | ICD-10-CM | POA: Diagnosis not present

## 2017-03-29 NOTE — Therapy (Signed)
[facility] [address] [place], [place], [address] Phone: [phone]   Fax:  [phone]  Occupational Therapy Treatment  Patient Details  Name: Kristin [name] MRN: [id number] Date of Birth: 07/27/1951 Referring Provider: Dr. [name] Tat   Encounter Date: 03/29/2017      OT End of Session - 03/29/17 1006    Visit Number 9   Number of Visits 17   Date for OT Re-Evaluation 04/16/17   Authorization Type Medicare/[employer], G-code needed   Authorization - Visit Number 9   Authorization - Number of Visits 10   OT Start Time [id number]   OT Stop Time 0940   OT Time Calculation (min) 50 min   Activity Tolerance Patient tolerated treatment well   Behavior During Therapy [facility] for tasks assessed/performed      Past Medical History:  Diagnosis Date  . Abnormal vaginal Pap smear   . Acute pharyngitis 02/25/2013  . Anemia   . Anxiety   . Arthritis   . 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 Chicken pox as a child  . Chronic UTI    sees dr Terance Hart  . Constipation 12/07/2015  . Depression with anxiety 11/02/2009   Qualifier: Diagnosis of  By: [name], LPN, [name]   . Dermatitis 07/17/2012  . Esophageal stricture 1994  . Fibroids   . Foot pain, bilateral 06/19/2012  . GERD (gastroesophageal reflux disease)   . GERD (gastroesophageal reflux disease)   . Hiatal hernia   . Hyperglycemia 08/19/2013  . 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 Hyperlipidemia   . Hypertension   . Infertility, female   . Measles as a child  . Obesity   . Osteoarthritis   . Parkinson disease ([place])   . Plantar fasciitis of left foot 06/19/2012  . Preventative health care 12/19/2015  . Rosacea 10/05/2014  . Swallowing difficulty   . Urinary frequency 02/25/2013  . Visual floaters 05/11/2014    Past Surgical History:  Procedure Laterality Date  . ABDOMINAL HYSTERECTOMY  2006   total  . esophageal     stretching  .  laporoscopy    . PARTIAL HIP ARTHROPLASTY  2010   right  . TONSILLECTOMY    . wisdom teeth extracted      There were no vitals filed for this visit.      Subjective Assessment - 03/29/17 1003    Subjective  Pt reports that some of the eating and dressing strategies that she has tried has helped.   Pertinent History Parkinson's disease; see Epic   Patient Stated Goals fine motor coordination, improve daily activities   Currently in Pain? No/denies       Transferring items from one calendar to new calendar with lines to work on Nurse, mental health.  Recommended writing in pencil on calendar to decr cross-outs, pt remembered to perform PWR! Hands prior to writing (with min cues for incr R elbow ext), recommended printing and starting of L side of the line.  Also made organizational recommendation as pt reports incr difficulty with attention and organization now.  Discussed use of routine, placing time (listed first) and then description of activity on daily schedule, use of Notes section for weekly to do list instead of various sticky notes.  Also discussed use of sticky notes with lines and use of contacts section of phone for email addresses and other miscellaneous info.  Made recommedations for scheduling weekly exercise classes and discussed  that pt cannot do all classes and to consider HEP and other things such as music therapy as well (discussed benefits).  Pt verbalized understanding and reports that education provided was helpful.                         OT Education - 03/29/17 1005    Education Details Writing strategies (print, use pencil on calender, use lines, organization of calendar tips/consistency, use of PWR! hands)   Person(s) Educated Patient   Methods Explanation   Comprehension Verbalized understanding          OT Short Term Goals - 03/22/17 1256      OT SHORT TERM GOAL #1   Title Pt will be independent with HEP.--check STGs 03/16/17   Time 4    Period Weeks   Status On-going  03/19/17  instructed, but would benefit from review     OT [place] #2   Title Pt will verbalize understanding of ways to prevent future complications related to PD.   Period Weeks   Status Achieved  03/22/17     OT SHORT TERM GOAL #3   Title Pt will improve balance and functional reaching for IADLs as shown by improving standing functional reach by at least 2" with RUE.   Baseline R-9", L-12"   Time 4   Period Weeks   Status New           OT Long Term Goals - 02/15/17 1710      OT LONG TERM GOAL #1   Title Pt will verbalize understanding of adaptive stratgies to incr. safety/ease/independence with ADLs/IADLs (including eating, doffing jacket, buttoning, putting on make-up, donning shoes/socks, scooping food, opening bottles).--check LTGs 04/16/17   Time 8   Period Weeks   Status New     OT LONG TERM GOAL #2   Title Pt will improve coordination for ADLs as shown by improving time on 9-hole peg test by at least 3sec with R hand.   Baseline R-24.85sec   Period Weeks   Status New     OT LONG TERM GOAL #3   Title Pt will improve functional reaching/coordination for ADLs as shown by improving score on box and blocks test by at least 3blocks with RUE.   Time 8   Period Weeks   Status New     OT LONG TERM GOAL #4   Title Pt will report incr ease with donning R shoe/sock.   Time 8   Period Weeks   Status New     OT LONG TERM GOAL #5   Title Pt will report incr ease with donning pull-over shirt.   Time 8   Period Weeks   Status New               Plan - 03/29/17 1001    Clinical Impression Statement Pt continues to progress towards goals.  Pt demo good legibilty with writing today.   Rehab Potential Good   OT Frequency 2x / week   OT Duration 8 weeks  +eval    OT Treatment/Interventions Self-care/ADL training;Energy conservation;Manual Therapy;Passive range of motion;Cognitive remediation/compensation;Functional Mobility  Training;Neuromuscular education;Cryotherapy;Therapeutic activities;Therapeutic exercise;Therapeutic exercises;Patient/family education;DME and/or AE instruction;Moist Heat   Plan G-code; check standing functional reach; continue with writing strategies; discuss scheduling of HEP   OT Home Exercise Plan Education provided:   02/27/17  PWR! hands (basic 4), Coordination HEP (card activiites)   Consulted and Agree with Plan of Care Patient  Patient will benefit from skilled therapeutic intervention in order to improve the following deficits and impairments:  Decreased cognition, Decreased mobility, Impaired UE functional use, Decreased knowledge of use of DME, Decreased balance, Decreased activity tolerance, Impaired tone, Improper spinal/pelvic alignment, Decreased coordination (bradykinesia)  Visit Diagnosis: Other symptoms and signs involving the nervous system  Other symptoms and signs involving the musculoskeletal system  Other lack of coordination  Other abnormalities of gait and mobility  Abnormal posture  Unsteadiness on feet  Tremor  Attention and concentration deficit    Problem List Patient Active Problem List   Diagnosis Date Noted  . Vitamin D deficiency 12/20/2016  . Prediabetes 12/20/2016  . Morbid obesity ([place]) 11/27/2016  . Shortness of breath on exertion 11/27/2016  . Parkinson disease ([place]) 11/27/2016  . Preventative health care 12/19/2015  . Constipation 12/07/2015  . BCC (basal cell carcinoma of skin) 10/05/2014  . Rosacea 10/05/2014  . Parkinson's disease ([place]) 05/11/2014  . Visual floaters 05/11/2014  . Paralysis agitans ([place]) 01/08/2014  . Hyperglycemia 08/19/2013  . Urinary frequency 02/25/2013  . Screening for cervical cancer 07/17/2012  . Foot pain, bilateral 06/19/2012  . 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 Obesity 11/12/2009  . Depression with anxiety 11/02/2009  . 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 Lumbar back pain with  radiculopathy affecting left lower extremity 10/17/2007  . Essential hypertension 07/05/2007  . Osteoarthritis 07/05/2007  . Hyperlipidemia 06/26/2007  . H/O: iron deficiency anemia 05/08/2007  . GERD 05/08/2007    [facility] 03/29/2017, 10:39 AM  [facility] [address] [place], [place], [address] Phone: [phone]   Fax:  [phone]  Name: Kristin [name] MRN: [id number] Date of Birth: [birth year]   Vianne Bulls, OTR/L [facility] [address] [place], [place]  [address] [phone] phone [phone] 03/29/17 10:43 AM

## 2017-04-03 ENCOUNTER — Ambulatory Visit (INDEPENDENT_AMBULATORY_CARE_PROVIDER_SITE_OTHER): Payer: Medicare Other | Admitting: Physician Assistant

## 2017-04-09 ENCOUNTER — Ambulatory Visit: Payer: Medicare Other | Attending: Neurology | Admitting: Occupational Therapy

## 2017-04-09 DIAGNOSIS — R293 Abnormal posture: Secondary | ICD-10-CM | POA: Diagnosis not present

## 2017-04-09 DIAGNOSIS — R29818 Other symptoms and signs involving the nervous system: Secondary | ICD-10-CM

## 2017-04-09 DIAGNOSIS — R2681 Unsteadiness on feet: Secondary | ICD-10-CM | POA: Diagnosis not present

## 2017-04-09 DIAGNOSIS — R278 Other lack of coordination: Secondary | ICD-10-CM

## 2017-04-09 DIAGNOSIS — R41840 Attention and concentration deficit: Secondary | ICD-10-CM | POA: Diagnosis not present

## 2017-04-09 DIAGNOSIS — R2689 Other abnormalities of gait and mobility: Secondary | ICD-10-CM

## 2017-04-09 DIAGNOSIS — R29898 Other symptoms and signs involving the musculoskeletal system: Secondary | ICD-10-CM | POA: Diagnosis not present

## 2017-04-09 DIAGNOSIS — R251 Tremor, unspecified: Secondary | ICD-10-CM | POA: Diagnosis not present

## 2017-04-09 NOTE — Therapy (Signed)
[facility] [address] [place], [place], [address] Phone: [phone]   Fax:  [phone]  Occupational Therapy Treatment  Patient Details  Name: Kristin [name] MRN: [id number] Date of Birth: Aug 12, 1951 Referring Provider: Dr. [name] Tat   Encounter Date: 04/09/2017      OT End of Session - 04/09/17 0808    Visit Number 10   Number of Visits 18  10+8=18   Date for OT Re-Evaluation 05/10/17   Authorization Type Medicare/[employer], G-code needed   Authorization Time Period renewal completed 04/09/17   Authorization - Visit Number 10   Authorization - Number of Visits 10   OT Start Time 0804   OT Stop Time 0845   OT Time Calculation (min) 41 min   Activity Tolerance Patient tolerated treatment well   Behavior During Therapy [facility] for tasks assessed/performed      Past Medical History:  Diagnosis Date  . Abnormal vaginal Pap smear   . Acute pharyngitis 02/25/2013  . Anemia   . Anxiety   . Arthritis   . 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 Chicken pox as a child  . Chronic UTI    sees dr Terance Hart  . Constipation 12/07/2015  . Depression with anxiety 11/02/2009   Qualifier: Diagnosis of  By: [name], LPN, [name]   . Dermatitis 07/17/2012  . Esophageal stricture 1994  . Fibroids   . Foot pain, bilateral 06/19/2012  . GERD (gastroesophageal reflux disease)   . GERD (gastroesophageal reflux disease)   . Hiatal hernia   . Hyperglycemia 08/19/2013  . 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 Hyperlipidemia   . Hypertension   . Infertility, female   . Measles as a child  . Obesity   . Osteoarthritis   . Parkinson disease ([place])   . Plantar fasciitis of left foot 06/19/2012  . Preventative health care 12/19/2015  . Rosacea 10/05/2014  . Swallowing difficulty   . Urinary frequency 02/25/2013  . Visual floaters 05/11/2014    Past Surgical History:  Procedure Laterality Date  . ABDOMINAL  HYSTERECTOMY  2006   total  . esophageal     stretching  . laporoscopy    . PARTIAL HIP ARTHROPLASTY  2010   right  . TONSILLECTOMY    . wisdom teeth extracted      There were no vitals filed for this visit.      Subjective Assessment - 04/09/17 0806    Subjective  Pt reports that her knee has been fine.  Pt reports improvements in ease of eating and dressing with strategies   Pertinent History Parkinson's disease; see Epic   Patient Stated Goals fine motor coordination, improve daily activities   Currently in Pain? No/denies      Began checking goals and discussing progress.  Pt reports improvements with ADLs with use of strategies.  Also check standing functional reach--see below.    Long discussion regarding scheduling HEP/classes and how to work into schedule (established written schedule with pt that pt felt was manageable)--pt reports that she now feels less overwhelmed.    Practiced writing 5 sentences, addressing envelope, and writing thank you card with good legibility/size.    Reviewed strategies for donning R shoe/socks and pt able to return demo with good success.   Reviewed strategies for eating and putting on make-up and pt verbalized understanding.  OT Short Term Goals - 04/09/17 0829      OT SHORT TERM GOAL #1   Title Pt will be independent with HEP.--check STGs 03/16/17   Time 4   Period Weeks   Status Achieved  03/19/17  instructed, but would benefit from review.  04/09/17 met     OT SHORT TERM GOAL #2   Title Pt will verbalize understanding of ways to prevent future complications related to PD.   Period Weeks   Status Achieved  03/22/17     OT SHORT TERM GOAL #3   Title Pt will improve balance and functional reaching for IADLs as shown by improving standing functional reach by at least 2" with RUE.   Baseline R-12", L-9"   Time 4   Period Weeks   Status On-going  04/09/17:  R-12", L-10"           OT  Long Term Goals - 04/09/17 1309      OT LONG TERM GOAL #1   Title Pt will verbalize understanding of adaptive stratgies to incr. safety/ease/independence with ADLs/IADLs (including eating, doffing jacket, buttoning, putting on make-up, donning shoes/socks, scooping food, opening bottles).--check LTGs 04/16/17   Time 8   Period Weeks   Status On-going     OT LONG TERM GOAL #2   Title Pt will improve coordination for ADLs as shown by improving time on 9-hole peg test by at least 3sec with R hand.   Baseline R-24.85sec   Period Weeks   Status On-going     OT LONG TERM GOAL #3   Title Pt will improve functional reaching/coordination for ADLs as shown by improving score on box and blocks test by at least 3blocks with RUE.   Time 8   Period Weeks   Status On-going     OT LONG TERM GOAL #4   Title Pt will report incr ease with donning R shoe/sock.   Time 8   Period Weeks   Status On-going     OT LONG TERM GOAL #5   Title Pt will report incr ease with donning pull-over shirt.   Time 8   Period Weeks   Status --  04/09/17               Plan - 04/09/17 0808    Clinical Impression Statement Pt continues to make good progress towards goals with improving writing and ADL performance.  Pt would benefit from continued occupational therapy to address fine motor coordination and functional balance for ADLs in order to prevent future complications and improve quality of life.   Rehab Potential Good   OT Frequency 2x / week   OT Duration 4 weeks  renewal completed 04/09/17   OT Treatment/Interventions Self-care/ADL training;Energy conservation;Manual Therapy;Passive range of motion;Cognitive remediation/compensation;Functional Mobility Training;Neuromuscular education;Cryotherapy;Therapeutic activities;Therapeutic exercise;Therapeutic exercises;Patient/family education;DME and/or AE instruction;Moist Heat   Plan continue with strategies for ADLs; review PWR! moves   OT Home Exercise Plan  Education provided:   02/27/17  PWR! hands (basic 4), Coordination HEP (card activiites)   Consulted and Agree with Plan of Care Patient      Patient will benefit from skilled therapeutic intervention in order to improve the following deficits and impairments:  Decreased cognition, Decreased mobility, Impaired UE functional use, Decreased knowledge of use of DME, Decreased balance, Decreased activity tolerance, Impaired tone, Improper spinal/pelvic alignment, Decreased coordination (bradykinesia)  Visit Diagnosis: Other symptoms and signs involving the nervous system  Other symptoms and signs involving the musculoskeletal system  Other  lack of coordination  Other abnormalities of gait and mobility  Abnormal posture  Unsteadiness on feet  Attention and concentration deficit      G-Codes - 04-24-17 0836    Functional Assessment Tool Used (Outpatient only) difficulty with putting on eye make-up, eating, buttoning, donning R shoe/sock, scooping food, opening bottles, donning pull-over shirt--all improved some with use of strategies, but could benefit from reinforcement   Functional Limitation Self care   Self Care Current Status (L7373) At least 1 percent but less than 20 percent impaired, limited or restricted   Self Care Goal Status (G6815) At least 1 percent but less than 20 percent impaired, limited or restricted      Problem List Patient Active Problem List   Diagnosis Date Noted  . Vitamin D deficiency 12/20/2016  . Prediabetes 12/20/2016  . Morbid obesity ([place]) 11/27/2016  . Shortness of breath on exertion 11/27/2016  . Parkinson disease ([place]) 11/27/2016  . Preventative health care 12/19/2015  . Constipation 12/07/2015  . BCC (basal cell carcinoma of skin) 10/05/2014  . Rosacea 10/05/2014  . Parkinson's disease ([place]) 05/11/2014  . Visual floaters 05/11/2014  . Paralysis agitans ([place]) 01/08/2014  . Hyperglycemia 08/19/2013  . Urinary frequency 02/25/2013  . Screening  for cervical cancer 07/17/2012  . Foot pain, bilateral 06/19/2012  . 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 Obesity 11/12/2009  . Depression with anxiety 11/02/2009  . 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 Lumbar back pain with radiculopathy affecting left lower extremity 10/17/2007  . Essential hypertension 07/05/2007  . Osteoarthritis 07/05/2007  . Hyperlipidemia 06/26/2007  . H/O: iron deficiency anemia 05/08/2007  . GERD 05/08/2007    [facility] 2017/04/24, 4:37 PM  [facility] [address] [place], [place], [address] Phone: [phone]   Fax:  [phone]  Name: Kristin [name] MRN: [id number] Date of Birth: 1951-05-29   Vianne Bulls, OTR/L [facility] [address] [place], [place]  [address] [phone] phone [phone] 24-Apr-2017 4:37 PM

## 2017-04-09 NOTE — Patient Instructions (Addendum)
(  Exercise) Monday Tuesday Wednesday Thursday Friday Saturday Sunday   PWR! sitting           PWR! standing           PWR! All 4's           PWR supine           PWR! prone           Corner Balance Ex.            PWR! Pensions consultant Walking          Classes

## 2017-04-16 ENCOUNTER — Ambulatory Visit (INDEPENDENT_AMBULATORY_CARE_PROVIDER_SITE_OTHER): Payer: Medicare Other | Admitting: Physician Assistant

## 2017-04-16 ENCOUNTER — Ambulatory Visit: Payer: Medicare Other | Admitting: Occupational Therapy

## 2017-04-16 VITALS — BP 113/71 | HR 72 | Temp 98.1°F | Ht 64.0 in | Wt 237.0 lb

## 2017-04-16 DIAGNOSIS — E669 Obesity, unspecified: Secondary | ICD-10-CM

## 2017-04-16 DIAGNOSIS — R278 Other lack of coordination: Secondary | ICD-10-CM | POA: Diagnosis not present

## 2017-04-16 DIAGNOSIS — R29818 Other symptoms and signs involving the nervous system: Secondary | ICD-10-CM | POA: Diagnosis not present

## 2017-04-16 DIAGNOSIS — I10 Essential (primary) hypertension: Secondary | ICD-10-CM

## 2017-04-16 DIAGNOSIS — R293 Abnormal posture: Secondary | ICD-10-CM | POA: Diagnosis not present

## 2017-04-16 DIAGNOSIS — E559 Vitamin D deficiency, unspecified: Secondary | ICD-10-CM | POA: Diagnosis not present

## 2017-04-16 DIAGNOSIS — R29898 Other symptoms and signs involving the musculoskeletal system: Secondary | ICD-10-CM | POA: Diagnosis not present

## 2017-04-16 DIAGNOSIS — R2681 Unsteadiness on feet: Secondary | ICD-10-CM

## 2017-04-16 DIAGNOSIS — R2689 Other abnormalities of gait and mobility: Secondary | ICD-10-CM | POA: Diagnosis not present

## 2017-04-16 DIAGNOSIS — R7303 Prediabetes: Secondary | ICD-10-CM

## 2017-04-16 DIAGNOSIS — Z6841 Body Mass Index (BMI) 40.0 and over, adult: Secondary | ICD-10-CM

## 2017-04-16 DIAGNOSIS — IMO0001 Reserved for inherently not codable concepts without codable children: Secondary | ICD-10-CM

## 2017-04-16 DIAGNOSIS — E7849 Other hyperlipidemia: Secondary | ICD-10-CM

## 2017-04-16 DIAGNOSIS — E784 Other hyperlipidemia: Secondary | ICD-10-CM

## 2017-04-16 DIAGNOSIS — R41840 Attention and concentration deficit: Secondary | ICD-10-CM

## 2017-04-16 NOTE — Progress Notes (Signed)
Office: [phone]  /  Fax: [phone]   HPI:   Chief Complaint: OBESITY Kristin [name] is here to discuss her progress with her obesity treatment plan. She is on the  follow the Category 2 plan and is following her eating plan approximately 50 % of the time. She states she is exercising at spin, circuit, physical therapy, yoga and biking for 60 minutes 5 times per week. [name] has increase emotional and boredom eating, and noticed she is snacking more. She has been making smarter food choices and controlling her portions.  Her weight is 237 lb (107.5 kg) today and gained 1 lbs since her last visit. She has lost 7 lbs since starting treatment with [place].  Hypertension Kristin [name] is a 66 y.o. female with hypertension.  Kristin [name] denies chest pain or shortness of breath on exertion. She is working weight loss to help control her blood pressure with the goal of decreasing her risk of heart attack and stroke. Kristin [name] blood pressure is currently controlled.  Hyperlipidemia Thetis has hyperlipidemia and has been trying to improve her cholesterol levels with intensive lifestyle modification including a low saturated fat diet, exercise and weight loss. She denies any chest pain, claudication or myalgias.  Pre-Diabetes Kristin [name] has a diagnosis of prediabetes based on her elevated HgA1c and was informed this puts her at greater risk of developing diabetes. She is not taking metformin currently and continues to work on diet and exercise to decrease risk of diabetes. She denies nausea or hypoglycemia.  Vitamin D deficiency Kristin [name] has a diagnosis of vitamin D deficiency. She is currently taking vit D and denies nausea, vomiting or muscle weakness.    ALLERGIES: Allergies  Allergen Reactions  . Bactrim [Sulfamethoxazole-Trimethoprim] Other (See Comments)    Oral ulcers and rash  . Penicillins Hives    MEDICATIONS: Current Outpatient Prescriptions on File Prior to Visit  Medication Sig Dispense Refill  .  ALPRAZolam (XANAX) 0.5 MG tablet Take 1 tablet (0.5 mg total) by mouth as needed for anxiety or sleep. 30 tablet 1  . carbidopa-levodopa (SINEMET CR) 50-200 MG tablet Take 1 tablet by mouth at bedtime. 90 tablet 1  . carbidopa-levodopa (SINEMET IR) 25-100 MG tablet take 1 tablet by mouth three times a day 90 tablet 5  . DULoxetine (CYMBALTA) 30 MG capsule take 1 capsule by mouth once daily 90 capsule 1  . escitalopram (LEXAPRO) 20 MG tablet take 1 tablet by mouth once daily 90 tablet 2  . omeprazole (PRILOSEC) 20 MG capsule take 1 capsule by mouth once daily 90 capsule 1  . polyethylene glycol powder (GLYCOLAX/MIRALAX) powder Take 17 g by mouth 2 (two) times daily as needed (1 to 2 times per day, as needed). 850 g 0  . pramipexole (MIRAPEX) 0.5 MG tablet TAKE 2 TABLETS BY MOUTH IN THE MORNING 1 TABLET IN THE AFTERNOON AND 1 TABLET IN THE EVENING 360 tablet 3  . rosuvastatin (CRESTOR) 10 MG tablet TAKE 1 TABLET BY MOUTH ON MONDAYS, WEDNESDAYS, AND FRIDAYS 90 tablet 0  . telmisartan-hydrochlorothiazide (MICARDIS HCT) 80-12.5 MG tablet TAKE 1 TABLET BY MOUTH DAILY (Patient taking differently: Take 0.5 tablets by mouth daily. ) 90 tablet 1  . Vitamin D, Ergocalciferol, (DRISDOL) 50000 units CAPS capsule Take 1 capsule (50,000 Units total) by mouth every 7 (seven) days. 4 capsule 0   No current facility-administered medications on file prior to visit.     PAST MEDICAL HISTORY: Past Medical History:  Diagnosis Date  . Abnormal vaginal  Pap smear   . Acute pharyngitis 02/25/2013  . Anemia   . Anxiety   . Arthritis   . 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 Chicken pox as a child  . Chronic UTI    sees dr Terance Hart  . Constipation 12/07/2015  . Depression with anxiety 11/02/2009   Qualifier: Diagnosis of  By: [name], LPN, [name]   . Dermatitis 07/17/2012  . Esophageal stricture 1994  . Fibroids   . Foot pain, bilateral 06/19/2012  . GERD (gastroesophageal reflux disease)     . GERD (gastroesophageal reflux disease)   . Hiatal hernia   . Hyperglycemia 08/19/2013  . 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 Hyperlipidemia   . Hypertension   . Infertility, female   . Measles as a child  . Obesity   . Osteoarthritis   . Parkinson disease ([place])   . Plantar fasciitis of left foot 06/19/2012  . Preventative health care 12/19/2015  . Rosacea 10/05/2014  . Swallowing difficulty   . Urinary frequency 02/25/2013  . Visual floaters 05/11/2014    PAST SURGICAL HISTORY: Past Surgical History:  Procedure Laterality Date  . ABDOMINAL HYSTERECTOMY  2006   total  . esophageal     stretching  . laporoscopy    . PARTIAL HIP ARTHROPLASTY  2010   right  . TONSILLECTOMY    . wisdom teeth extracted      SOCIAL HISTORY: Social History  Substance Use Topics  . Smoking status: Never Smoker  . Smokeless tobacco: Never Used  . Alcohol use 2.4 oz/week    4 Glasses of wine per week     Comment: 4 glasses of wine weekly    FAMILY HISTORY: Family History  Problem Relation Age of Onset  . Cancer Mother        breast  . Other Mother        arrythmia  . Mental illness Mother        bipolar  . Hyperlipidemia Mother   . Thyroid disease Mother   . Depression Mother   . Bipolar disorder Mother   . Heart disease Father   . Arthritis Father        rheumatoid  . Hypertension Father   . Hyperlipidemia Father   . Depression Sister   . Mental illness Sister        bipolar  . Parkinson's disease Sister   . Arthritis Sister   . Colon cancer Neg Hx   . Esophageal cancer Neg Hx   . Rectal cancer Neg Hx   . Stomach cancer Neg Hx     ROS: Review of Systems  Respiratory: Negative for shortness of breath.   Cardiovascular: Negative for chest pain and claudication.  Gastrointestinal: Negative for nausea and vomiting.  Musculoskeletal:       Negative muscle weakness  Endo/Heme/Allergies:       Negative polyphagia    PHYSICAL EXAM: Blood pressure 113/71, pulse 72,  temperature 98.1 F (36.7 C), height 5\' 4"  (1.626 m), weight 237 lb (107.5 kg), SpO2 95 %. Body mass index is 40.68 kg/m. Physical Exam  Constitutional: She is oriented to person, place, and time. She appears well-developed and well-nourished.  Cardiovascular: Normal rate.   Pulmonary/Chest: Effort normal.  Musculoskeletal: Normal range of motion.  Neurological: She is alert and oriented to person, place, and time.  Skin: Skin is warm and dry.  Psychiatric: She has a normal mood and affect.    RECENT LABS  AND TESTS: BMET    Component Value Date/Time   NA 141 11/27/2016 1123   K 4.4 11/27/2016 1123   CL 102 11/27/2016 1123   CO2 26 11/27/2016 1123   GLUCOSE 87 11/27/2016 1123   GLUCOSE 97 11/03/2016 1030   BUN 13 11/27/2016 1123   CREATININE 0.75 11/27/2016 1123   CREATININE 0.71 01/04/2015 1046   CALCIUM 9.2 11/27/2016 1123   GFRNONAA 83 11/27/2016 1123   GFRNONAA >89 01/04/2015 1046   GFRAA 96 11/27/2016 1123   GFRAA >89 01/04/2015 1046   Lab Results  Component Value Date   HGBA1C 6.1 (H) 11/27/2016   HGBA1C 6.2 11/03/2016   HGBA1C 6.0 05/04/2016   HGBA1C 6.1 12/07/2015   HGBA1C 5.9 01/04/2015   Lab Results  Component Value Date   INSULIN 18.0 11/27/2016   CBC    Component Value Date/Time   WBC 5.5 11/27/2016 1123   WBC 6.1 11/03/2016 1030   RBC 4.31 11/27/2016 1123   RBC 4.31 11/03/2016 1030   HGB 12.2 11/27/2016 1123   HCT 38.3 11/27/2016 1123   PLT 243.0 11/03/2016 1030   MCV 89 11/27/2016 1123   MCH 28.3 11/27/2016 1123   MCH 31.6 04/07/2013 0923   MCHC 31.9 11/27/2016 1123   MCHC 32.1 11/03/2016 1030   RDW 15.3 11/27/2016 1123   LYMPHSABS 1.8 11/27/2016 1123   EOSABS 0.1 11/27/2016 1123   BASOSABS 0.0 11/27/2016 1123   Iron/TIBC/Ferritin/ %Sat    Component Value Date/Time   IRON 31 03/05/2017 1224   TIBC 402 03/05/2017 1224   FERRITIN 12 (L) 03/05/2017 1224   IRONPCTSAT 8 (LL) 03/05/2017 1224   Lipid Panel     Component Value Date/Time     CHOL 180 11/27/2016 1123   TRIG 175 (H) 11/27/2016 1123   HDL 57 11/27/2016 1123   CHOLHDL 3 11/03/2016 1030   VLDL 20.6 11/03/2016 1030   LDLCALC 88 11/27/2016 1123   LDLDIRECT 161.5 10/07/2008 0944   Hepatic Function Panel     Component Value Date/Time   PROT 6.9 11/27/2016 1123   ALBUMIN 4.3 11/27/2016 1123   AST 23 11/27/2016 1123   ALT 9 11/27/2016 1123   ALKPHOS 79 11/27/2016 1123   BILITOT 0.4 11/27/2016 1123   BILIDIR 0.1 05/05/2014 0948   IBILI 0.4 04/07/2013 0923      Component Value Date/Time   TSH 1.480 11/27/2016 1123   TSH 0.87 11/03/2016 1030   TSH 1.13 05/04/2016 0935    ASSESSMENT AND PLAN: Essential hypertension - Plan: Comprehensive metabolic panel  Other hyperlipidemia - Plan: Lipid Panel With LDL/HDL Ratio  Prediabetes - Plan: Hemoglobin A1c, Insulin, random  Vitamin D deficiency - Plan: VITAMIN D 25 Hydroxy (Vit-D Deficiency, Fractures)  Class 3 obesity with serious comorbidity and body mass index (BMI) of 40.0 to 44.9 in adult, unspecified obesity type ([place])  PLAN:  Hypertension We discussed sodium restriction, working on healthy weight loss, and a regular exercise program as the means to achieve improved blood pressure control. Katherene agreed with this plan and agreed to follow up as directed. We will continue to monitor her blood pressure as well as her progress with the above lifestyle modifications. She will continue her medications as prescribed and will watch for signs of hypotension as she continues her lifestyle modifications. Will recheck labs today.   Hyperlipidemia Alley was informed of the American Heart Association Guidelines emphasizing intensive lifestyle modifications as the first line treatment for hyperlipidemia. We discussed many lifestyle modifications today in  depth, and Lavene will continue to work on decreasing saturated fats such as fatty red meat, butter and many fried foods. She will also increase vegetables and lean protein in  her diet and continue to work on exercise and weight loss efforts. Will recheck labs today.  Pre-Diabetes Tenishia will continue to work on weight loss, exercise, and decreasing simple carbohydrates in her diet to help decrease the risk of diabetes. We dicussed metformin including benefits and risks. She was informed that eating too many simple carbohydrates or too many calories at one sitting increases the likelihood of GI side effects. Shamiya declined metformin for now and a prescription was not written today. Jurni agreed to follow up with [place] as directed to monitor her progress. Will recheck labs today.   Vitamin D Deficiency Kenisha was informed that low vitamin D levels contributes to fatigue and are associated with obesity, breast, and colon cancer. She agrees to continue to take prescription Vit D @50 ,000 IU every week and will follow up for routine testing of vitamin D, at least 2-3 times per year, will recheck labs today.She was informed of the risk of over-replacement of vitamin D and agrees to not increase her dose unless he discusses this with [place] first.  Obesity Surena is currently in the action stage of change. As such, her goal is to continue with weight loss efforts She has agreed to follow the Category 2 plan Dorie has been instructed to work up to a goal of 150 minutes of combined cardio and strengthening exercise per week for weight loss and overall health benefits. We discussed the following Behavioral Modification Stratagies today: increasing lean protein intake and emotional eating strategies  Phenix has agreed to follow up with our clinic in 2 weeks. She was informed of the importance of frequent follow up visits to maximize her success with intensive lifestyle modifications for her multiple health conditions.   Office: [phone]  /  Fax: [phone]  OBESITY BEHAVIORAL INTERVENTION VISIT  Today's visit was # 8 out of 22.  Starting weight: 244 Starting date: 11/27/16 Today's  weight : Weight: 237 lb (107.5 kg)  Today's date: 04/16/2017 Total lbs lost to date: 7 (Patients must lose 7 lbs in the first 6 months to continue with counseling)   ASK: We discussed the diagnosis of obesity with Kyra Searles today and Stanton Kidney agreed to give [place] permission to discuss obesity behavioral modification therapy today.  ASSESS: Angi has the diagnosis of obesity and her BMI today is 19.8 Mailee is in the action stage of change   ADVISE: [place]aceland was educated on the multiple health risks of obesity as well as the benefit of weight loss to improve her health. She was advised of the need for long term treatment and the importance of lifestyle modifications.  AGREE: Multiple dietary modification options and treatment options were discussed and  Teresea agreed to follow the Category 2 plan We discussed the following Behavioral Modification Stratagies today: increasing lean protein intake and emotional eating strategies    I have reviewed the above documentation for accuracy and completeness, and I agree with the above. -Lacy Duverney, PA-C  I have reviewed the above note and agree with the plan. -Dennard Nip, MD

## 2017-04-16 NOTE — Therapy (Signed)
[facility] [address] [place], [place], [address] Phone: [phone]   Fax:  [phone]  Occupational Therapy Treatment  Patient Details  Name: Kristin [name] MRN: [id number] Date of Birth: February 04, 1951 Referring Provider: Dr. [name] Tat   Encounter Date: 04/16/2017      OT End of Session - 04/16/17 0814    Visit Number 11   Number of Visits 18  10+8=18   Date for OT Re-Evaluation 05/10/17   Authorization Type Medicare/[employer], G-code needed   Authorization Time Period renewal completed 04/09/17   Authorization - Visit Number 11   Authorization - Number of Visits 20   OT Start Time 0804   OT Stop Time 0845   OT Time Calculation (min) 41 min   Activity Tolerance Patient tolerated treatment well   Behavior During Therapy [facility] for tasks assessed/performed      Past Medical History:  Diagnosis Date  . Abnormal vaginal Pap smear   . Acute pharyngitis 02/25/2013  . Anemia   . Anxiety   . Arthritis   . 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 Chicken pox as a child  . Chronic UTI    sees dr Terance Hart  . Constipation 12/07/2015  . Depression with anxiety 11/02/2009   Qualifier: Diagnosis of  By: [name], LPN, [name]   . Dermatitis 07/17/2012  . Esophageal stricture 1994  . Fibroids   . Foot pain, bilateral 06/19/2012  . GERD (gastroesophageal reflux disease)   . GERD (gastroesophageal reflux disease)   . Hiatal hernia   . Hyperglycemia 08/19/2013  . 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 Hyperlipidemia   . Hypertension   . Infertility, female   . Measles as a child  . Obesity   . Osteoarthritis   . Parkinson disease ([place])   . Plantar fasciitis of left foot 06/19/2012  . Preventative health care 12/19/2015  . Rosacea 10/05/2014  . Swallowing difficulty   . Urinary frequency 02/25/2013  . Visual floaters 05/11/2014    Past Surgical History:  Procedure Laterality Date  . ABDOMINAL  HYSTERECTOMY  2006   total  . esophageal     stretching  . laporoscopy    . PARTIAL HIP ARTHROPLASTY  2010   right  . TONSILLECTOMY    . wisdom teeth extracted      There were no vitals filed for this visit.      Subjective Assessment - 04/16/17 0806    Subjective  Pt reports that with her husband's medical issues/beach trip, that she has not been able to follow her HEP/community ex schedule.   Pertinent History Parkinson's disease; see Epic   Patient Stated Goals fine motor coordination, improve daily activities   Currently in Pain? No/denies        PWR! Moves (basic 4) in sitting, supine, quadruped, standing, prone, and PWR! Hands x 10 each with occasional min cues For incr movement amplitude (R hand) or technique.  Reviewed functional tasks that PWR! Moves address.  Pt verbalized understanding.  Discussed medication consistency and emphasized importance.                           OT Short Term Goals - 04/09/17 0829      OT SHORT TERM GOAL #1   Title Pt will be independent with HEP.--check STGs 03/16/17   Time 4   Period Weeks   Status Achieved  03/19/17  instructed, but would benefit from review.  04/09/17 met     OT SHORT TERM GOAL #2   Title Pt will verbalize understanding of ways to prevent future complications related to PD.   Period Weeks   Status Achieved  03/22/17     OT SHORT TERM GOAL #3   Title Pt will improve balance and functional reaching for IADLs as shown by improving standing functional reach by at least 2" with RUE.   Baseline R-12", L-9"   Time 4   Period Weeks   Status On-going  04/09/17:  R-12", L-10"           OT Long Term Goals - 04/09/17 1309      OT LONG TERM GOAL #1   Title Pt will verbalize understanding of adaptive stratgies to incr. safety/ease/independence with ADLs/IADLs (including eating, doffing jacket, buttoning, putting on make-up, donning shoes/socks, scooping food, opening bottles).--check LTGs 04/16/17    Time 8   Period Weeks   Status On-going     OT LONG TERM GOAL #2   Title Pt will improve coordination for ADLs as shown by improving time on 9-hole peg test by at least 3sec with R hand.   Baseline R-24.85sec   Period Weeks   Status On-going     OT LONG TERM GOAL #3   Title Pt will improve functional reaching/coordination for ADLs as shown by improving score on box and blocks test by at least 3blocks with RUE.   Time 8   Period Weeks   Status On-going     OT LONG TERM GOAL #4   Title Pt will report incr ease with donning R shoe/sock.   Time 8   Period Weeks   Status On-going     OT LONG TERM GOAL #5   Title Pt will report incr ease with donning pull-over shirt.   Time 8   Period Weeks   Status --  04/09/17               Plan - 04/16/17 0815    Clinical Impression Statement Pt continues to progress well towards goals.  Pt able to return demo all PWR! moves with occasional min cueing for incr movement aplitude or technique for max effort.   Rehab Potential Good   OT Frequency 2x / week   OT Duration 4 weeks  renewal completed 04/09/17   OT Treatment/Interventions Self-care/ADL training;Energy conservation;Manual Therapy;Passive range of motion;Cognitive remediation/compensation;Functional Mobility Training;Neuromuscular education;Cryotherapy;Therapeutic activities;Therapeutic exercise;Therapeutic exercises;Patient/family education;DME and/or AE instruction;Moist Heat   Plan review coordination HEP   OT Home Exercise Plan Education provided:   02/27/17  PWR! hands (basic 4), Coordination HEP (card activiites)   Consulted and Agree with Plan of Care Patient      Patient will benefit from skilled therapeutic intervention in order to improve the following deficits and impairments:  Decreased cognition, Decreased mobility, Impaired UE functional use, Decreased knowledge of use of DME, Decreased balance, Decreased activity tolerance, Impaired tone, Improper spinal/pelvic  alignment, Decreased coordination (bradykinesia)  Visit Diagnosis: Other symptoms and signs involving the nervous system  Other symptoms and signs involving the musculoskeletal system  Other lack of coordination  Other abnormalities of gait and mobility  Abnormal posture  Unsteadiness on feet  Attention and concentration deficit    Problem List Patient Active Problem List   Diagnosis Date Noted  . Vitamin D deficiency 12/20/2016  . Prediabetes 12/20/2016  . Morbid obesity ([place]) 11/27/2016  . Shortness of breath on exertion 11/27/2016  .  Parkinson disease ([place]) 11/27/2016  . Preventative health care 12/19/2015  . Constipation 12/07/2015  . BCC (basal cell carcinoma of skin) 10/05/2014  . Rosacea 10/05/2014  . Parkinson's disease ([place]) 05/11/2014  . Visual floaters 05/11/2014  . Paralysis agitans ([place]) 01/08/2014  . Hyperglycemia 08/19/2013  . Urinary frequency 02/25/2013  . Screening for cervical cancer 07/17/2012  . Foot pain, bilateral 06/19/2012  . 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 Obesity 11/12/2009  . Depression with anxiety 11/02/2009  . 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 Lumbar back pain with radiculopathy affecting left lower extremity 10/17/2007  . Essential hypertension 07/05/2007  . Osteoarthritis 07/05/2007  . Hyperlipidemia 06/26/2007  . H/O: iron deficiency anemia 05/08/2007  . GERD 05/08/2007    [facility] 04/16/2017, 9:44 AM  [facility] [address] [place], [place], [address] Phone: [phone]   Fax:  [phone]  Name: Kristin [name] MRN: [id number] Date of Birth: May 27, 1951   Vianne Bulls, OTR/[facility] [address] [place] [employer], [place]  [address] [phone] phone [phone] 04/16/17 9:44 AM

## 2017-04-17 DIAGNOSIS — F320 Major depressive disorder, single episode, mild: Secondary | ICD-10-CM | POA: Diagnosis not present

## 2017-04-23 ENCOUNTER — Ambulatory Visit: Payer: Medicare Other | Admitting: Occupational Therapy

## 2017-04-23 ENCOUNTER — Encounter: Payer: Self-pay | Admitting: Family Medicine

## 2017-04-23 DIAGNOSIS — R41840 Attention and concentration deficit: Secondary | ICD-10-CM

## 2017-04-23 DIAGNOSIS — R29898 Other symptoms and signs involving the musculoskeletal system: Secondary | ICD-10-CM

## 2017-04-23 DIAGNOSIS — R2681 Unsteadiness on feet: Secondary | ICD-10-CM

## 2017-04-23 DIAGNOSIS — R29818 Other symptoms and signs involving the nervous system: Secondary | ICD-10-CM

## 2017-04-23 DIAGNOSIS — R278 Other lack of coordination: Secondary | ICD-10-CM | POA: Diagnosis not present

## 2017-04-23 DIAGNOSIS — R251 Tremor, unspecified: Secondary | ICD-10-CM

## 2017-04-23 DIAGNOSIS — R293 Abnormal posture: Secondary | ICD-10-CM | POA: Diagnosis not present

## 2017-04-23 DIAGNOSIS — R2689 Other abnormalities of gait and mobility: Secondary | ICD-10-CM

## 2017-04-23 NOTE — Therapy (Signed)
[facility] [address] [place], [place], [address] Phone: [phone]   Fax:  [phone]  Occupational Therapy Treatment  Patient Details  Name: Kristin [name] MRN: [id number] Date of Birth: 01/27/1951 Referring Provider: Dr. [name] Tat   Encounter Date: 04/23/2017      OT End of Session - 04/23/17 0811    Visit Number 12   Number of Visits 18  10+8=18   Date for OT Re-Evaluation 05/10/17   Authorization Type Medicare/[employer], G-code needed   Authorization Time Period renewal completed 04/09/17   Authorization - Visit Number 12   Authorization - Number of Visits 20   OT Start Time 0804   OT Stop Time 0845   OT Time Calculation (min) 41 min   Activity Tolerance Patient tolerated treatment well   Behavior During Therapy [facility] for tasks assessed/performed      Past Medical History:  Diagnosis Date  . Abnormal vaginal Pap smear   . Acute pharyngitis 02/25/2013  . Anemia   . Anxiety   . Arthritis   . 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 Chicken pox as a child  . Chronic UTI    sees dr Terance Hart  . Constipation 12/07/2015  . Depression with anxiety 11/02/2009   Qualifier: Diagnosis of  By: [name], LPN, [name]   . Dermatitis 07/17/2012  . Esophageal stricture 1994  . Fibroids   . Foot pain, bilateral 06/19/2012  . GERD (gastroesophageal reflux disease)   . GERD (gastroesophageal reflux disease)   . Hiatal hernia   . Hyperglycemia 08/19/2013  . 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 Hyperlipidemia   . Hypertension   . Infertility, female   . Measles as a child  . Obesity   . Osteoarthritis   . Parkinson disease ([place])   . Plantar fasciitis of left foot 06/19/2012  . Preventative health care 12/19/2015  . Rosacea 10/05/2014  . Swallowing difficulty   . Urinary frequency 02/25/2013  . Visual floaters 05/11/2014    Past Surgical History:  Procedure Laterality Date  . ABDOMINAL  HYSTERECTOMY  2006   total  . esophageal     stretching  . laporoscopy    . PARTIAL HIP ARTHROPLASTY  2010   right  . TONSILLECTOMY    . wisdom teeth extracted      There were no vitals filed for this visit.      Subjective Assessment - 04/23/17 0806    Pertinent History Parkinson's disease; see Epic   Patient Stated Goals fine motor coordination, improve daily activities   Currently in Pain? No/denies                              OT Education - 04/23/17 0819    Education Details Coordination HEP--reviewed (flipping, dealing, flipping between each finger, sliding with PWR! hands, rotating ball in fingertips with each hand, tossing ball in each hand then between hands)   Person(s) Educated Patient   Methods Explanation;Demonstration;Verbal cues  min v.c.   Comprehension Returned demonstration;Verbalized understanding          OT Short Term Goals - 04/09/17 0829      OT SHORT TERM GOAL #1   Title Pt will be independent with HEP.--check STGs 03/16/17   Time 4   Period Weeks   Status Achieved  03/19/17  instructed, but would benefit from review.  04/09/17 met  OT SHORT TERM GOAL #2   Title Pt will verbalize understanding of ways to prevent future complications related to PD.   Period Weeks   Status Achieved  03/22/17     OT SHORT TERM GOAL #3   Title Pt will improve balance and functional reaching for IADLs as shown by improving standing functional reach by at least 2" with RUE.   Baseline R-12", L-9"   Time 4   Period Weeks   Status On-going  04/09/17:  R-12", L-10"           OT Long Term Goals - 04/09/17 1309      OT LONG TERM GOAL #1   Title Pt will verbalize understanding of adaptive stratgies to incr. safety/ease/independence with ADLs/IADLs (including eating, doffing jacket, buttoning, putting on make-up, donning shoes/socks, scooping food, opening bottles).--check LTGs 04/16/17   Time 8   Period Weeks   Status On-going     OT  LONG TERM GOAL #2   Title Pt will improve coordination for ADLs as shown by improving time on 9-hole peg test by at least 3sec with R hand.   Baseline R-24.85sec   Period Weeks   Status On-going     OT LONG TERM GOAL #3   Title Pt will improve functional reaching/coordination for ADLs as shown by improving score on box and blocks test by at least 3blocks with RUE.   Time 8   Period Weeks   Status On-going     OT LONG TERM GOAL #4   Title Pt will report incr ease with donning R shoe/sock.   Time 8   Period Weeks   Status On-going     OT LONG TERM GOAL #5   Title Pt will report incr ease with donning pull-over shirt.   Time 8   Period Weeks   Status --  04/09/17               Plan - 04/23/17 1610    Clinical Impression Statement Pt is progressing towards goals with improving coordination and incr movement amplitude.   Rehab Potential Good   OT Frequency 2x / week   OT Duration 4 weeks  renewal completed 04/09/17   OT Treatment/Interventions Self-care/ADL training;Energy conservation;Manual Therapy;Passive range of motion;Cognitive remediation/compensation;Functional Mobility Training;Neuromuscular education;Cryotherapy;Therapeutic activities;Therapeutic exercise;Therapeutic exercises;Patient/family education;DME and/or AE instruction;Moist Heat   Plan finish reviewing coordination HEP   OT Home Exercise Plan Education provided:   02/27/17  PWR! hands (basic 4), Coordination HEP (card activiites)   Consulted and Agree with Plan of Care Patient      Patient will benefit from skilled therapeutic intervention in order to improve the following deficits and impairments:  Decreased cognition, Decreased mobility, Impaired UE functional use, Decreased knowledge of use of DME, Decreased balance, Decreased activity tolerance, Impaired tone, Improper spinal/pelvic alignment, Decreased coordination (bradykinesia)  Visit Diagnosis: Other symptoms and signs involving the musculoskeletal  system  Other symptoms and signs involving the nervous system  Other lack of coordination  Other abnormalities of gait and mobility  Abnormal posture  Unsteadiness on feet  Attention and concentration deficit  Tremor    Problem List Patient Active Problem List   Diagnosis Date Noted  . Vitamin D deficiency 12/20/2016  . Prediabetes 12/20/2016  . Morbid obesity ([place]) 11/27/2016  . Shortness of breath on exertion 11/27/2016  . Parkinson disease ([place]) 11/27/2016  . Preventative health care 12/19/2015  . Constipation 12/07/2015  . BCC (basal cell carcinoma of skin) 10/05/2014  . Rosacea 10/05/2014  .  Parkinson's disease ([place]) 05/11/2014  . Visual floaters 05/11/2014  . Paralysis agitans ([place]) 01/08/2014  . Hyperglycemia 08/19/2013  . Urinary frequency 02/25/2013  . Screening for cervical cancer 07/17/2012  . Foot pain, bilateral 06/19/2012  . 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 Obesity 11/12/2009  . Depression with anxiety 11/02/2009  . 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 Lumbar back pain with radiculopathy affecting left lower extremity 10/17/2007  . Essential hypertension 07/05/2007  . Osteoarthritis 07/05/2007  . Hyperlipidemia 06/26/2007  . H/O: iron deficiency anemia 05/08/2007  . GERD 05/08/2007    [facility] 04/23/2017, 8:47 AM  [place] [address] [place], [place], [address] Phone: [phone]   Fax:  [phone]  Name: Kristin [name] MRN: [id number] Date of Birth: 10-Dec-1950   Vianne Bulls, OTR/L [facility] [address] [place], [place]  [address] [phone] phone [phone] 04/23/17 8:47 AM

## 2017-04-26 ENCOUNTER — Ambulatory Visit: Payer: Medicare Other | Admitting: Occupational Therapy

## 2017-04-26 DIAGNOSIS — R41840 Attention and concentration deficit: Secondary | ICD-10-CM

## 2017-04-26 DIAGNOSIS — R2681 Unsteadiness on feet: Secondary | ICD-10-CM | POA: Diagnosis not present

## 2017-04-26 DIAGNOSIS — R278 Other lack of coordination: Secondary | ICD-10-CM | POA: Diagnosis not present

## 2017-04-26 DIAGNOSIS — R2689 Other abnormalities of gait and mobility: Secondary | ICD-10-CM

## 2017-04-26 DIAGNOSIS — R293 Abnormal posture: Secondary | ICD-10-CM

## 2017-04-26 DIAGNOSIS — R29818 Other symptoms and signs involving the nervous system: Secondary | ICD-10-CM

## 2017-04-26 DIAGNOSIS — F320 Major depressive disorder, single episode, mild: Secondary | ICD-10-CM | POA: Diagnosis not present

## 2017-04-26 DIAGNOSIS — R29898 Other symptoms and signs involving the musculoskeletal system: Secondary | ICD-10-CM | POA: Diagnosis not present

## 2017-04-26 NOTE — Therapy (Signed)
[facility] [address] [place], [place], [address] Phone: [phone]   Fax:  [phone]  Occupational Therapy Treatment  Patient Details  Name: Kristin [name] MRN: [id number] Date of Birth: May 03, 1951 Referring Provider: Dr. [name] Tat   Encounter Date: 04/26/2017      OT End of Session - 04/26/17 1331    Visit Number 13   Number of Visits 18  10+8=18   Date for OT Re-Evaluation 05/10/17   Authorization Type Medicare/[employer], G-code needed   Authorization Time Period renewal completed 04/09/17   Authorization - Visit Number 13   Authorization - Number of Visits 20   OT Start Time 1319   OT Stop Time 1400   OT Time Calculation (min) 41 min   Activity Tolerance Patient tolerated treatment well   Behavior During Therapy [facility] for tasks assessed/performed      Past Medical History:  Diagnosis Date  . Abnormal vaginal Pap smear   . Acute pharyngitis 02/25/2013  . Anemia   . Anxiety   . Arthritis   . 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 Chicken pox as a child  . Chronic UTI    sees dr Terance Hart  . Constipation 12/07/2015  . Depression with anxiety 11/02/2009   Qualifier: Diagnosis of  By: [name], LPN, [name]   . Dermatitis 07/17/2012  . Esophageal stricture 1994  . Fibroids   . Foot pain, bilateral 06/19/2012  . GERD (gastroesophageal reflux disease)   . GERD (gastroesophageal reflux disease)   . Hiatal hernia   . Hyperglycemia 08/19/2013  . 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 Hyperlipidemia   . Hypertension   . Infertility, female   . Measles as a child  . Obesity   . Osteoarthritis   . Parkinson disease ([place])   . Plantar fasciitis of left foot 06/19/2012  . Preventative health care 12/19/2015  . Rosacea 10/05/2014  . Swallowing difficulty   . Urinary frequency 02/25/2013  . Visual floaters 05/11/2014    Past Surgical History:  Procedure Laterality Date  . ABDOMINAL  HYSTERECTOMY  2006   total  . esophageal     stretching  . laporoscopy    . PARTIAL HIP ARTHROPLASTY  2010   right  . TONSILLECTOMY    . wisdom teeth extracted      There were no vitals filed for this visit.      Subjective Assessment - 04/26/17 1320    Subjective  Pt reports that she hasn't began her weekly HEP schedule yet, but her schedule for classes has been working well this week.   Pertinent History Parkinson's disease; see Epic   Patient Stated Goals fine motor coordination, improve daily activities   Currently in Pain? No/denies         Checked on progress and discussed barriers with utilizing weekly schedule/organization for HEP/exercise classes.                         OT Education - 04/26/17 1344    Education Details Coordination HEP--reviewed (picking up and stacking coins, manipulating coins in hand, juggling 2 balls, rotating 2 balls in hand)   Person(s) Educated Patient   Methods Explanation;Demonstration   Comprehension Verbalized understanding;Returned demonstration          OT Short Term Goals - 04/09/17 0829      OT SHORT TERM GOAL #1   Title Pt will be  independent with HEP.--check STGs 03/16/17   Time 4   Period Weeks   Status Achieved  03/19/17  instructed, but would benefit from review.  04/09/17 met     OT SHORT TERM GOAL #2   Title Pt will verbalize understanding of ways to prevent future complications related to PD.   Period Weeks   Status Achieved  03/22/17     OT SHORT TERM GOAL #3   Title Pt will improve balance and functional reaching for IADLs as shown by improving standing functional reach by at least 2" with RUE.   Baseline R-12", L-9"   Time 4   Period Weeks   Status On-going  04/09/17:  R-12", L-10"           OT Long Term Goals - 04/26/17 1549      OT LONG TERM GOAL #1   Title Pt will verbalize understanding of adaptive stratgies to incr. safety/ease/independence with ADLs/IADLs (including eating,  doffing jacket, buttoning, putting on make-up, donning shoes/socks, scooping food, opening bottles).--check LTGs 04/16/17   Time 8   Period Weeks   Status On-going     OT LONG TERM GOAL #2   Title Pt will improve coordination for ADLs as shown by improving time on 9-hole peg test by at least 3sec with R hand.   Baseline R-24.85sec   Period Weeks   Status On-going     OT LONG TERM GOAL #3   Title Pt will improve functional reaching/coordination for ADLs as shown by improving score on box and blocks test by at least 3blocks with RUE.   Time 8   Period Weeks   Status On-going     OT LONG TERM GOAL #4   Title Pt will report incr ease with donning R shoe/sock.   Time 8   Period Weeks   Status On-going     OT LONG TERM GOAL #5   Title Pt will report incr ease with donning pull-over shirt.   Time 8   Period Weeks   Status Achieved  04/09/17               Plan - 04/26/17 1343    Clinical Impression Statement Pt is progressing towards goals with improving coordination and movement amplitude.   Rehab Potential Good   OT Frequency 2x / week   OT Duration 4 weeks  renewal completed 04/09/17   OT Treatment/Interventions Self-care/ADL training;Energy conservation;Manual Therapy;Passive range of motion;Cognitive remediation/compensation;Functional Mobility Training;Neuromuscular education;Cryotherapy;Therapeutic activities;Therapeutic exercise;Therapeutic exercises;Patient/family education;DME and/or AE instruction;Moist Heat   Plan check on weekly HEP, large amplitude movement strategies, coordination   OT Home Exercise Plan Education provided:   02/27/17  PWR! hands (basic 4), Coordination HEP (card activiites)   Consulted and Agree with Plan of Care Patient      Patient will benefit from skilled therapeutic intervention in order to improve the following deficits and impairments:  Decreased cognition, Decreased mobility, Impaired UE functional use, Decreased knowledge of use of DME,  Decreased balance, Decreased activity tolerance, Impaired tone, Improper spinal/pelvic alignment, Decreased coordination (bradykinesia)  Visit Diagnosis: Other symptoms and signs involving the musculoskeletal system  Other symptoms and signs involving the nervous system  Other lack of coordination  Abnormal posture  Other abnormalities of gait and mobility  Unsteadiness on feet  Attention and concentration deficit    Problem List Patient Active Problem List   Diagnosis Date Noted  . Vitamin D deficiency 12/20/2016  . Prediabetes 12/20/2016  . Morbid obesity ([place]) 11/27/2016  .  Shortness of breath on exertion 11/27/2016  . Parkinson disease ([place]) 11/27/2016  . Preventative health care 12/19/2015  . Constipation 12/07/2015  . BCC (basal cell carcinoma of skin) 10/05/2014  . Rosacea 10/05/2014  . Parkinson's disease ([place]) 05/11/2014  . Visual floaters 05/11/2014  . Paralysis agitans ([place]) 01/08/2014  . Hyperglycemia 08/19/2013  . Urinary frequency 02/25/2013  . Screening for cervical cancer 07/17/2012  . Foot pain, bilateral 06/19/2012  . 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 Obesity 11/12/2009  . Depression with anxiety 11/02/2009  . 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 Lumbar back pain with radiculopathy affecting left lower extremity 10/17/2007  . Essential hypertension 07/05/2007  . Osteoarthritis 07/05/2007  . Hyperlipidemia 06/26/2007  . H/O: iron deficiency anemia 05/08/2007  . GERD 05/08/2007    [facility] 04/26/2017, 3:49 PM  [facility] [address] [place], [place], [address] Phone: [phone]   Fax:  [phone]  Name: Kristin [name] MRN: [id number] Date of Birth: Feb 23, 1951   Vianne Bulls, OTR/L [facility] [address] [place],   [address] [phone] phone [phone] 04/26/17 3:50 PM

## 2017-04-30 ENCOUNTER — Ambulatory Visit (INDEPENDENT_AMBULATORY_CARE_PROVIDER_SITE_OTHER): Payer: Medicare Other | Admitting: Physician Assistant

## 2017-04-30 VITALS — BP 102/68 | HR 77 | Temp 97.8°F | Ht 64.0 in | Wt 236.0 lb

## 2017-04-30 DIAGNOSIS — I10 Essential (primary) hypertension: Secondary | ICD-10-CM

## 2017-04-30 DIAGNOSIS — E669 Obesity, unspecified: Secondary | ICD-10-CM | POA: Diagnosis not present

## 2017-04-30 DIAGNOSIS — E559 Vitamin D deficiency, unspecified: Secondary | ICD-10-CM

## 2017-04-30 DIAGNOSIS — IMO0001 Reserved for inherently not codable concepts without codable children: Secondary | ICD-10-CM

## 2017-04-30 DIAGNOSIS — Z6841 Body Mass Index (BMI) 40.0 and over, adult: Secondary | ICD-10-CM

## 2017-04-30 MED ORDER — VITAMIN D (ERGOCALCIFEROL) 1.25 MG (50000 UNIT) PO CAPS: 50000.0000 [IU] | ORAL_CAPSULE | ORAL | 0 refills | Status: DC

## 2017-05-01 NOTE — Progress Notes (Signed)
Office: [phone]  /  Fax: [phone]   HPI:   Chief Complaint: OBESITY Kristin [name] is here to discuss her progress with her obesity treatment plan. She is on the Category 2 plan and is following her eating plan approximately 50 % of the time. She states she is cycling, spin class, boxing, exercise class and yoga for 60 to 90  minutes 5 times per week. Kristin [name] continues to do well with weight loss. She has been trying to get her protein in but states this has been challenging on some days. Hunger is not as well controlled on days she does not get her proteins. Her weight is 236 lb (107 kg) today and has had a weight loss of 1 pound over a period of 2 weeks since her last visit. She has lost 8 lbs since starting treatment with [place].  Vitamin D deficiency Kristin [name] has a diagnosis of vitamin D deficiency. She is currently taking vit D and denies nausea, vomiting or muscle weakness.  Hypertension Kristin [name] is a 66 y.o. female with hypertension. Her blood pressure is stable and Kristin [name] denies chest pain or shortness of breath on exertion. She is working weight loss to help control her blood pressure with the goal of decreasing her risk of heart attack and stroke. Kristin [name] blood pressure is currently controlled.     ALLERGIES: Allergies  Allergen Reactions   Bactrim [Sulfamethoxazole-Trimethoprim] Other (See Comments)    Oral ulcers and rash   Penicillins Hives    MEDICATIONS: Current Outpatient Prescriptions on File Prior to Visit  Medication Sig Dispense Refill   ALPRAZolam (XANAX) 0.5 MG tablet Take 1 tablet (0.5 mg total) by mouth as needed for anxiety or sleep. 30 tablet 1   carbidopa-levodopa (SINEMET CR) 50-200 MG tablet Take 1 tablet by mouth at bedtime. 90 tablet 1   carbidopa-levodopa (SINEMET IR) 25-100 MG tablet take 1 tablet by mouth three times a day 90 tablet 5   DULoxetine (CYMBALTA) 30 MG capsule take 1 capsule by mouth once daily 90 capsule 1   escitalopram  (LEXAPRO) 20 MG tablet take 1 tablet by mouth once daily 90 tablet 2   omeprazole (PRILOSEC) 20 MG capsule take 1 capsule by mouth once daily 90 capsule 1   polyethylene glycol powder (GLYCOLAX/MIRALAX) powder Take 17 g by mouth 2 (two) times daily as needed (1 to 2 times per day, as needed). 850 g 0   pramipexole (MIRAPEX) 0.5 MG tablet TAKE 2 TABLETS BY MOUTH IN THE MORNING 1 TABLET IN THE AFTERNOON AND 1 TABLET IN THE EVENING 360 tablet 3   rosuvastatin (CRESTOR) 10 MG tablet TAKE 1 TABLET BY MOUTH ON MONDAYS, WEDNESDAYS, AND FRIDAYS 90 tablet 0   telmisartan-hydrochlorothiazide (MICARDIS HCT) 80-12.5 MG tablet TAKE 1 TABLET BY MOUTH DAILY (Patient taking differently: Take 0.5 tablets by mouth daily. ) 90 tablet 1   No current facility-administered medications on file prior to visit.     PAST MEDICAL HISTORY: Past Medical History:  Diagnosis Date   Abnormal vaginal Pap smear    Acute pharyngitis 02/25/2013   Anemia    Anxiety    Arthritis    BCC (basal cell carcinoma of skin) 10/05/2014   On back   Chicken pox as a child   Chronic UTI    sees dr Terance Hart   Constipation 12/07/2015   Depression with anxiety 11/02/2009   Qualifier: Diagnosis of  By: [name], LPN, [name]    Dermatitis 07/17/2012   Esophageal  stricture 1994   Fibroids    Foot pain, bilateral 06/19/2012   GERD (gastroesophageal reflux disease)    GERD (gastroesophageal reflux disease)    Hiatal hernia    Hyperglycemia 08/19/2013   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   Hyperlipidemia    Hypertension    Infertility, female    Measles as a child   Obesity    Osteoarthritis    Parkinson disease ([place])    Plantar fasciitis of left foot 06/19/2012   Preventative health care 12/19/2015   Rosacea 10/05/2014   Swallowing difficulty    Urinary frequency 02/25/2013   Visual floaters 05/11/2014    PAST SURGICAL HISTORY: Past Surgical History:  Procedure Laterality Date   ABDOMINAL  HYSTERECTOMY  2006   total   esophageal     stretching   laporoscopy     PARTIAL HIP ARTHROPLASTY  2010   right   TONSILLECTOMY     wisdom teeth extracted      SOCIAL HISTORY: Social History  Substance Use Topics   Smoking status: Never Smoker   Smokeless tobacco: Never Used   Alcohol use 2.4 oz/week    4 Glasses of wine per week     Comment: 4 glasses of wine weekly    FAMILY HISTORY: Family History  Problem Relation Age of Onset   Cancer Mother        breast   Other Mother        arrythmia   Mental illness Mother        bipolar   Hyperlipidemia Mother    Thyroid disease Mother    Depression Mother    Bipolar disorder Mother    Heart disease Father    Arthritis Father        rheumatoid   Hypertension Father    Hyperlipidemia Father    Depression Sister    Mental illness Sister        bipolar   Parkinson's disease Sister    Arthritis Sister    Colon cancer Neg Hx    Esophageal cancer Neg Hx    Rectal cancer Neg Hx    Stomach cancer Neg Hx     ROS: Review of Systems  Constitutional: Positive for weight loss.  Respiratory: Negative for shortness of breath (on exertion).   Cardiovascular: Negative for chest pain.  Gastrointestinal: Negative for nausea and vomiting.  Musculoskeletal:       Negative muscle weakness    PHYSICAL EXAM: Blood pressure 102/68, pulse 77, temperature 97.8 F (36.6 C), temperature source Oral, height 5\' 4"  (1.626 m), weight 236 lb (107 kg), SpO2 97 %. Body mass index is 40.51 kg/m. Physical Exam  Constitutional: She is oriented to person, place, and time. She appears well-developed and well-nourished.  Cardiovascular: Normal rate.   Pulmonary/Chest: Effort normal.  Musculoskeletal: Normal range of motion.  Neurological: She is oriented to person, place, and time.  Skin: Skin is warm and dry.  Psychiatric: She has a normal mood and affect. Her behavior is normal.  Vitals reviewed.   RECENT LABS  AND TESTS: BMET    Component Value Date/Time   NA 141 11/27/2016 1123   K 4.4 11/27/2016 1123   CL 102 11/27/2016 1123   CO2 26 11/27/2016 1123   GLUCOSE 87 11/27/2016 1123   GLUCOSE 97 11/03/2016 1030   BUN 13 11/27/2016 1123   CREATININE 0.75 11/27/2016 1123   CREATININE 0.71 01/04/2015 1046   CALCIUM 9.2 11/27/2016 1123   GFRNONAA 83 11/27/2016 1123  GFRNONAA >89 01/04/2015 1046   GFRAA 96 11/27/2016 1123   GFRAA >89 01/04/2015 1046   Lab Results  Component Value Date   HGBA1C 6.1 (H) 11/27/2016   HGBA1C 6.2 11/03/2016   HGBA1C 6.0 05/04/2016   HGBA1C 6.1 12/07/2015   HGBA1C 5.9 01/04/2015   Lab Results  Component Value Date   INSULIN 18.0 11/27/2016   CBC    Component Value Date/Time   WBC 5.5 11/27/2016 1123   WBC 6.1 11/03/2016 1030   RBC 4.31 11/27/2016 1123   RBC 4.31 11/03/2016 1030   HGB 12.2 11/27/2016 1123   HCT 38.3 11/27/2016 1123   PLT 243.0 11/03/2016 1030   MCV 89 11/27/2016 1123   MCH 28.3 11/27/2016 1123   MCH 31.6 04/07/2013 0923   MCHC 31.9 11/27/2016 1123   MCHC 32.1 11/03/2016 1030   RDW 15.3 11/27/2016 1123   LYMPHSABS 1.8 11/27/2016 1123   EOSABS 0.1 11/27/2016 1123   BASOSABS 0.0 11/27/2016 1123   Iron/TIBC/Ferritin/ %Sat    Component Value Date/Time   IRON 31 03/05/2017 1224   TIBC 402 03/05/2017 1224   FERRITIN 12 (L) 03/05/2017 1224   IRONPCTSAT 8 (LL) 03/05/2017 1224   Lipid Panel     Component Value Date/Time   CHOL 180 11/27/2016 1123   TRIG 175 (H) 11/27/2016 1123   HDL 57 11/27/2016 1123   CHOLHDL 3 11/03/2016 1030   VLDL 20.6 11/03/2016 1030   LDLCALC 88 11/27/2016 1123   LDLDIRECT 161.5 10/07/2008 0944   Hepatic Function Panel     Component Value Date/Time   PROT 6.9 11/27/2016 1123   ALBUMIN 4.3 11/27/2016 1123   AST 23 11/27/2016 1123   ALT 9 11/27/2016 1123   ALKPHOS 79 11/27/2016 1123   BILITOT 0.4 11/27/2016 1123   BILIDIR 0.1 05/05/2014 0948   IBILI 0.4 04/07/2013 0923      Component Value  Date/Time   TSH 1.480 11/27/2016 1123   TSH 0.87 11/03/2016 1030   TSH 1.13 05/04/2016 0935    ASSESSMENT AND PLAN: Vitamin D deficiency - Plan: Vitamin D, Ergocalciferol, (DRISDOL) 50000 units CAPS capsule  Essential hypertension  Class 3 obesity with serious comorbidity and body mass index (BMI) of 40.0 to 44.9 in adult, unspecified obesity type (HCC)  PLAN:  Vitamin D Deficiency Kristin [name] was informed that low vitamin D levels contributes to fatigue and are associated with obesity, breast, and colon cancer. She agrees to continue to take prescription Vit D @50 ,000 IU every week, we will refill for 1 month and will follow up for routine testing of vitamin D, at least 2-3 times per year. She was informed of the risk of over-replacement of vitamin D and agrees to not increase her dose unless he discusses this with [place] first. Kristin [name] agrees to follow up with our clinic in 2 weeks.  Hypertension We discussed sodium restriction, working on healthy weight loss, and a regular exercise program as the means to achieve improved blood pressure control. Kristin [name] agreed with this plan and agreed to follow up as directed. We will continue to monitor her blood pressure as well as her progress with the above lifestyle modifications. She will continue her medications as prescribed and will watch for signs of hypotension as she continues her lifestyle modifications.  Obesity Kristin [name] is currently in the action stage of change. As such, her goal is to continue with weight loss efforts She has agreed to follow the Category 2 plan Kristin [name] has been instructed to work up to  a goal of 150 minutes of combined cardio and strengthening exercise per week for weight loss and overall health benefits. We discussed the following Behavioral Modification Strategies today: meal planning & cooking strategies and increasing lean protein intake  Kristin [name] has agreed to follow up with our clinic in 2 weeks. She was informed of the importance of  frequent follow up visits to maximize her success with intensive lifestyle modifications for her multiple health conditions.  I, Doreene Nest, am acting as transcriptionist for Kristin Duverney, PA-C  I have reviewed the above documentation for accuracy and completeness, and I agree with the above. -Kristin Duverney, PA-C  I have reviewed the above note and agree with the plan. Kristin Nip, MD  Office: [phone]  /  Fax: [phone]  OBESITY BEHAVIORAL INTERVENTION VISIT  Today's visit was # 9 out of 22.  Starting weight: 244 lbs Starting date: 11/27/16 Today's weight : 236 lbs Today's date: 04/30/2017 Total lbs lost to date: 8 (Patients must lose 7 lbs in the first 6 months to continue with counseling)   ASK: We discussed the diagnosis of obesity with Kyra Searles today and Stanton Kidney agreed to give [place] permission to discuss obesity behavioral modification therapy today.  ASSESS: Shamira has the diagnosis of obesity and her BMI today is 40.49 Payten is in the action stage of change   ADVISE: Amazing was educated on the multiple health risks of obesity as well as the benefit of weight loss to improve her health. She was advised of the need for long term treatment and the importance of lifestyle modifications.  AGREE: Multiple dietary modification options and treatment options were discussed and  Emmilyn agreed to follow the Category 2 plan We discussed the following Behavioral Modification Strategies today: meal planning & cooking strategies and increasing lean protein intake

## 2017-05-03 ENCOUNTER — Ambulatory Visit: Payer: Medicare Other | Admitting: Occupational Therapy

## 2017-05-03 DIAGNOSIS — R251 Tremor, unspecified: Secondary | ICD-10-CM

## 2017-05-03 DIAGNOSIS — R293 Abnormal posture: Secondary | ICD-10-CM | POA: Diagnosis not present

## 2017-05-03 DIAGNOSIS — R29898 Other symptoms and signs involving the musculoskeletal system: Secondary | ICD-10-CM | POA: Diagnosis not present

## 2017-05-03 DIAGNOSIS — R2681 Unsteadiness on feet: Secondary | ICD-10-CM

## 2017-05-03 DIAGNOSIS — R29818 Other symptoms and signs involving the nervous system: Secondary | ICD-10-CM

## 2017-05-03 DIAGNOSIS — R278 Other lack of coordination: Secondary | ICD-10-CM

## 2017-05-03 DIAGNOSIS — R2689 Other abnormalities of gait and mobility: Secondary | ICD-10-CM | POA: Diagnosis not present

## 2017-05-03 DIAGNOSIS — R41840 Attention and concentration deficit: Secondary | ICD-10-CM

## 2017-05-03 DIAGNOSIS — F320 Major depressive disorder, single episode, mild: Secondary | ICD-10-CM | POA: Diagnosis not present

## 2017-05-03 NOTE — Therapy (Signed)
[facility] [address] [place], [place], [address] Phone: [phone]   Fax:  [phone]  Occupational Therapy Treatment  Patient Details  Name: Kristin [name] MRN: [id number] Date of Birth: Oct 13, 1950 Referring Provider: Dr. [name] Tat   Encounter Date: 05/03/2017      OT End of Session - 05/03/17 1324    Visit Number 14   Number of Visits 18  10+8=18   Date for OT Re-Evaluation 05/10/17   Authorization Type Medicare/[employer], G-code needed   Authorization Time Period renewal completed 04/09/17   Authorization - Visit Number 14   Authorization - Number of Visits 20   OT Start Time 1318   OT Stop Time 1400   OT Time Calculation (min) 42 min   Activity Tolerance Patient tolerated treatment well   Behavior During Therapy [facility] for tasks assessed/performed      Past Medical History:  Diagnosis Date  . Abnormal vaginal Pap smear   . Acute pharyngitis 02/25/2013  . Anemia   . Anxiety   . Arthritis   . 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 Chicken pox as a child  . Chronic UTI    sees dr Terance Hart  . Constipation 12/07/2015  . Depression with anxiety 11/02/2009   Qualifier: Diagnosis of  By: [name], LPN, [name]   . Dermatitis 07/17/2012  . Esophageal stricture 1994  . Fibroids   . Foot pain, bilateral 06/19/2012  . GERD (gastroesophageal reflux disease)   . GERD (gastroesophageal reflux disease)   . Hiatal hernia   . Hyperglycemia 08/19/2013  . 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 Hyperlipidemia   . Hypertension   . Infertility, female   . Measles as a child  . Obesity   . Osteoarthritis   . Parkinson disease ([place])   . Plantar fasciitis of left foot 06/19/2012  . Preventative health care 12/19/2015  . Rosacea 10/05/2014  . Swallowing difficulty   . Urinary frequency 02/25/2013  . Visual floaters 05/11/2014    Past Surgical History:  Procedure Laterality Date  . ABDOMINAL  HYSTERECTOMY  2006   total  . esophageal     stretching  . laporoscopy    . PARTIAL HIP ARTHROPLASTY  2010   right  . TONSILLECTOMY    . wisdom teeth extracted      There were no vitals filed for this visit.      Subjective Assessment - 05/03/17 1319    Subjective  I just left spin.  "I think that this plan is going to go well."   Pertinent History Parkinson's disease; see Epic   Patient Stated Goals fine motor coordination, improve daily activities   Currently in Pain? No/denies      Self-care:    Discussed progress and problem-solved challenges with implementing weekly schedule for HEP.   Also reviewed says to incr difficulty/attend to small changes.    Reviewed use of large amplitude movement strategies for ADLs/IADLs and implementation.   Discussed challenges with social situations and PD stigma.  Reviewed recommendation for having response for challenging conversations/questions from acquaintances regarding PD which affects her desire to enter some social situations (very selected).  Recommended pt discuss this with her PD lunch group as well.  Also educated pt about anxiety as non-motor symptom of PD as pt reports increased anxiety over last 6 months which may affect participation.  Pt also reports that she is nervous about choking which may be  anxiety-related.  (Recommended small bites, chewing well, incr water, and follow up with speech therapist.  Pt reports that Dr. Carles Collet also gave her these recommendations).   Recommended pt discuss anxiety with Dr. Carles Collet and counselor.  Pt verbalized understanding/agreement  Discussed challenges with upcoming social event and problem solving, but recommended that if she anticipates difficulty will overshadow enjoyment too much and if it is an isolated situation, that it is ok to say no at times.                            OT Short Term Goals - 04/09/17 0829      OT SHORT TERM GOAL #1   Title Pt will be independent  with HEP.--check STGs 03/16/17   Time 4   Period Weeks   Status Achieved  03/19/17  instructed, but would benefit from review.  04/09/17 met     OT SHORT TERM GOAL #2   Title Pt will verbalize understanding of ways to prevent future complications related to PD.   Period Weeks   Status Achieved  03/22/17     OT SHORT TERM GOAL #3   Title Pt will improve balance and functional reaching for IADLs as shown by improving standing functional reach by at least 2" with RUE.   Baseline R-12", L-9"   Time 4   Period Weeks   Status On-going  04/09/17:  R-12", L-10"           OT Long Term Goals - 04/26/17 1549      OT LONG TERM GOAL #1   Title Pt will verbalize understanding of adaptive stratgies to incr. safety/ease/independence with ADLs/IADLs (including eating, doffing jacket, buttoning, putting on make-up, donning shoes/socks, scooping food, opening bottles).--check LTGs 04/16/17   Time 8   Period Weeks   Status On-going     OT LONG TERM GOAL #2   Title Pt will improve coordination for ADLs as shown by improving time on 9-hole peg test by at least 3sec with R hand.   Baseline R-24.85sec   Period Weeks   Status On-going     OT LONG TERM GOAL #3   Title Pt will improve functional reaching/coordination for ADLs as shown by improving score on box and blocks test by at least 3blocks with RUE.   Time 8   Period Weeks   Status On-going     OT LONG TERM GOAL #4   Title Pt will report incr ease with donning R shoe/sock.   Time 8   Period Weeks   Status On-going     OT LONG TERM GOAL #5   Title Pt will report incr ease with donning pull-over shirt.   Time 8   Period Weeks   Status Achieved  04/09/17             Patient will benefit from skilled therapeutic intervention in order to improve the following deficits and impairments:     Visit Diagnosis: Other symptoms and signs involving the musculoskeletal system  Other symptoms and signs involving the nervous system  Other  lack of coordination  Abnormal posture  Other abnormalities of gait and mobility  Unsteadiness on feet  Attention and concentration deficit  Tremor    Problem List Patient Active Problem List   Diagnosis Date Noted  . Vitamin D deficiency 12/20/2016  . Prediabetes 12/20/2016  . Morbid obesity ([place]) 11/27/2016  . Shortness of breath on exertion 11/27/2016  . Parkinson  disease ([place]) 11/27/2016  . Preventative health care 12/19/2015  . Constipation 12/07/2015  . BCC (basal cell carcinoma of skin) 10/05/2014  . Rosacea 10/05/2014  . Parkinson's disease ([place]) 05/11/2014  . Visual floaters 05/11/2014  . Paralysis agitans ([place]) 01/08/2014  . Hyperglycemia 08/19/2013  . Urinary frequency 02/25/2013  . Screening for cervical cancer 07/17/2012  . Foot pain, bilateral 06/19/2012  . 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 Obesity 11/12/2009  . Depression with anxiety 11/02/2009  . 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 Lumbar back pain with radiculopathy affecting left lower extremity 10/17/2007  . Essential hypertension 07/05/2007  . Osteoarthritis 07/05/2007  . Hyperlipidemia 06/26/2007  . H/O: iron deficiency anemia 05/08/2007  . GERD 05/08/2007    [facility] 05/03/2017, 1:28 PM  [facility] [address] [place], [place], [address] Phone: [phone]   Fax:  [phone]  Name: Kristin [name] MRN: [id number] Date of Birth: 12-Nov-1950   Vianne Bulls, OTR/L [facility] [address] [place], [place]  [address] [phone] phone [phone] 05/03/17 1:28 PM

## 2017-05-08 ENCOUNTER — Ambulatory Visit: Payer: Medicare Other | Attending: Neurology | Admitting: Occupational Therapy

## 2017-05-08 DIAGNOSIS — R278 Other lack of coordination: Secondary | ICD-10-CM

## 2017-05-08 DIAGNOSIS — R2681 Unsteadiness on feet: Secondary | ICD-10-CM | POA: Diagnosis not present

## 2017-05-08 DIAGNOSIS — R41840 Attention and concentration deficit: Secondary | ICD-10-CM

## 2017-05-08 DIAGNOSIS — R29818 Other symptoms and signs involving the nervous system: Secondary | ICD-10-CM

## 2017-05-08 DIAGNOSIS — R293 Abnormal posture: Secondary | ICD-10-CM

## 2017-05-08 DIAGNOSIS — R2689 Other abnormalities of gait and mobility: Secondary | ICD-10-CM

## 2017-05-08 DIAGNOSIS — R29898 Other symptoms and signs involving the musculoskeletal system: Secondary | ICD-10-CM

## 2017-05-08 NOTE — Therapy (Addendum)
[facility] [address] [place], [place], [address] Phone: [phone]   Fax:  [phone]  Occupational Therapy Treatment  Patient Details  Name: Kristin [name] MRN: [id number] Date of Birth: 07/11/1951 Referring Provider: Dr. [name] Tat   Encounter Date: 05/08/2017      OT End of Session - 05/08/17 0941    Visit Number 15   Number of Visits 18  10+8=18   Date for OT Re-Evaluation 05/10/17   Authorization Type Medicare/[employer], G-code needed   Authorization Time Period renewal completed 04/09/17   Authorization - Visit Number 15   Authorization - Number of Visits 20   OT Start Time [id number]   OT Stop Time 1015   OT Time Calculation (min) 39 min   Activity Tolerance Patient tolerated treatment well   Behavior During Therapy [facility] for tasks assessed/performed      Past Medical History:  Diagnosis Date  . Abnormal vaginal Pap smear   . Acute pharyngitis 02/25/2013  . Anemia   . Anxiety   . Arthritis   . 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 Chicken pox as a child  . Chronic UTI    sees dr Terance Hart  . Constipation 12/07/2015  . Depression with anxiety 11/02/2009   Qualifier: Diagnosis of  By: [name], LPN, [name]   . Dermatitis 07/17/2012  . Esophageal stricture 1994  . Fibroids   . Foot pain, bilateral 06/19/2012  . GERD (gastroesophageal reflux disease)   . GERD (gastroesophageal reflux disease)   . Hiatal hernia   . Hyperglycemia 08/19/2013  . 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 Hyperlipidemia   . Hypertension   . Infertility, female   . Measles as a child  . Obesity   . Osteoarthritis   . Parkinson disease ([place])   . Plantar fasciitis of left foot 06/19/2012  . Preventative health care 12/19/2015  . Rosacea 10/05/2014  . Swallowing difficulty   . Urinary frequency 02/25/2013  . Visual floaters 05/11/2014    Past Surgical History:  Procedure Laterality Date  . ABDOMINAL  HYSTERECTOMY  2006   total  . esophageal     stretching  . laporoscopy    . PARTIAL HIP ARTHROPLASTY  2010   right  . TONSILLECTOMY    . wisdom teeth extracted      There were no vitals filed for this visit.      Subjective Assessment - 05/08/17 0940    Subjective  I walked, but I didn't use the poles this weekend   Pertinent History Parkinson's disease; see Epic   Patient Stated Goals fine motor coordination, improve daily activities   Currently in Pain? No/denies       Large amplitude walking with min cueing.  Followed by tossing scarf between hands with ambulation for incr coordination/large amplitude movements then tossing 2 scarves alternately (1 in each hand).  In standing, functional reaching floor>overhead in diagonal pattern with trunk rotation and wt. Shift with each UE with min cueing for incr amplitude movements.  In standing tossing 2 scarves with therapist incorporating large amplitude movements, wt. Shifts and coordination with min cueing  Reviewed recommendation to discuss anxiety with physician and counselor and basic compensation strategies.  Pt verbalized understanding/agreement with plan.  Also reviewed barriers to HEP and discussed maintaining balance/compensation strategies.  Pt verbalized understanding.  Discussed discharge planning and plan for next visit and follow-up after d/c.  Pt verbalized understanding.  Reviewed  PWR! Hands basic 4  Pt returned demo with min cueing.                      OT Short Term Goals - 04/09/17 0829      OT SHORT TERM GOAL #1   Title Pt will be independent with HEP.--check STGs 03/16/17   Time 4   Period Weeks   Status Achieved  03/19/17  instructed, but would benefit from review.  04/09/17 met     OT SHORT TERM GOAL #2   Title Pt will verbalize understanding of ways to prevent future complications related to PD.   Period Weeks   Status Achieved  03/22/17     OT SHORT TERM GOAL #3   Title Pt will  improve balance and functional reaching for IADLs as shown by improving standing functional reach by at least 2" with RUE.   Baseline R-12", L-9"   Time 4   Period Weeks   Status On-going  04/09/17:  R-12", L-10"           OT Long Term Goals - 04/26/17 1549      OT LONG TERM GOAL #1   Title Pt will verbalize understanding of adaptive stratgies to incr. safety/ease/independence with ADLs/IADLs (including eating, doffing jacket, buttoning, putting on make-up, donning shoes/socks, scooping food, opening bottles).--check LTGs 04/16/17   Time 8   Period Weeks   Status On-going     OT LONG TERM GOAL #2   Title Pt will improve coordination for ADLs as shown by improving time on 9-hole peg test by at least 3sec with R hand.   Baseline R-24.85sec   Period Weeks   Status On-going     OT LONG TERM GOAL #3   Title Pt will improve functional reaching/coordination for ADLs as shown by improving score on box and blocks test by at least 3blocks with RUE.   Time 8   Period Weeks   Status On-going     OT LONG TERM GOAL #4   Title Pt will report incr ease with donning R shoe/sock.   Time 8   Period Weeks   Status On-going     OT LONG TERM GOAL #5   Title Pt will report incr ease with donning pull-over shirt.   Time 8   Period Weeks   Status Achieved  04/09/17               Plan - 05/08/17 0941    Clinical Impression Statement Pt cotninues to progress towards goals with improving movement amplitude and awareness of movement.   Rehab Potential Good   OT Frequency 2x / week   OT Duration 4 weeks  renewal completed 04/09/17   OT Treatment/Interventions Self-care/ADL training;Energy conservation;Manual Therapy;Passive range of motion;Cognitive remediation/compensation;Functional Mobility Training;Neuromuscular education;Cryotherapy;Therapeutic activities;Therapeutic exercise;Therapeutic exercises;Patient/family education;DME and/or AE instruction;Moist Heat   Plan check remaining goals  and d/c next session; schedule follow-up in 6-8 months   OT Home Exercise Plan Education provided:   02/27/17  PWR! hands (basic 4), Coordination HEP (card activiites)   Consulted and Agree with Plan of Care Patient      Patient will benefit from skilled therapeutic intervention in order to improve the following deficits and impairments:  Decreased cognition, Decreased mobility, Impaired UE functional use, Decreased knowledge of use of DME, Decreased balance, Decreased activity tolerance, Impaired tone, Improper spinal/pelvic alignment, Decreased coordination (bradykinesia)  Visit Diagnosis: Other symptoms and signs involving the musculoskeletal system  Other symptoms and  signs involving the nervous system  Other lack of coordination  Abnormal posture  Other abnormalities of gait and mobility  Unsteadiness on feet  Attention and concentration deficit    Problem List Patient Active Problem List   Diagnosis Date Noted  . Vitamin D deficiency 12/20/2016  . Prediabetes 12/20/2016  . Morbid obesity ([place]) 11/27/2016  . Shortness of breath on exertion 11/27/2016  . Parkinson disease ([place]) 11/27/2016  . Preventative health care 12/19/2015  . Constipation 12/07/2015  . BCC (basal cell carcinoma of skin) 10/05/2014  . Rosacea 10/05/2014  . Parkinson's disease ([place]) 05/11/2014  . Visual floaters 05/11/2014  . Paralysis agitans ([place]) 01/08/2014  . Hyperglycemia 08/19/2013  . Urinary frequency 02/25/2013  . Screening for cervical cancer 07/17/2012  . Foot pain, bilateral 06/19/2012  . 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 Obesity 11/12/2009  . Depression with anxiety 11/02/2009  . 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 Lumbar back pain with radiculopathy affecting left lower extremity 10/17/2007  . Essential hypertension 07/05/2007  . Osteoarthritis 07/05/2007  . Hyperlipidemia 06/26/2007  . H/O: iron deficiency anemia 05/08/2007  . GERD 05/08/2007     [facility] 05/08/2017, 10:24 AM  [facility] [address] [place], [place], [address] Phone: [phone]   Fax:  [phone]  Name: Kristin [name] MRN: [id number] Date of Birth: 1951/03/08   Vianne Bulls, OTR/L [facility] [address] [place], [place]  [address] [phone] phone [phone] 05/08/17 10:24 AM

## 2017-05-10 ENCOUNTER — Ambulatory Visit: Payer: Medicare Other | Admitting: Occupational Therapy

## 2017-05-10 ENCOUNTER — Ambulatory Visit (INDEPENDENT_AMBULATORY_CARE_PROVIDER_SITE_OTHER): Payer: Medicare Other | Admitting: Family Medicine

## 2017-05-10 ENCOUNTER — Encounter: Payer: Self-pay | Admitting: Family Medicine

## 2017-05-10 DIAGNOSIS — R278 Other lack of coordination: Secondary | ICD-10-CM | POA: Diagnosis not present

## 2017-05-10 DIAGNOSIS — R29898 Other symptoms and signs involving the musculoskeletal system: Secondary | ICD-10-CM | POA: Diagnosis not present

## 2017-05-10 DIAGNOSIS — E559 Vitamin D deficiency, unspecified: Secondary | ICD-10-CM | POA: Diagnosis not present

## 2017-05-10 DIAGNOSIS — E785 Hyperlipidemia, unspecified: Secondary | ICD-10-CM

## 2017-05-10 DIAGNOSIS — E669 Obesity, unspecified: Secondary | ICD-10-CM

## 2017-05-10 DIAGNOSIS — R739 Hyperglycemia, unspecified: Secondary | ICD-10-CM

## 2017-05-10 DIAGNOSIS — R7303 Prediabetes: Secondary | ICD-10-CM | POA: Diagnosis not present

## 2017-05-10 DIAGNOSIS — F418 Other specified anxiety disorders: Secondary | ICD-10-CM

## 2017-05-10 DIAGNOSIS — R29818 Other symptoms and signs involving the nervous system: Secondary | ICD-10-CM

## 2017-05-10 DIAGNOSIS — R2681 Unsteadiness on feet: Secondary | ICD-10-CM | POA: Diagnosis not present

## 2017-05-10 DIAGNOSIS — R2689 Other abnormalities of gait and mobility: Secondary | ICD-10-CM | POA: Diagnosis not present

## 2017-05-10 DIAGNOSIS — I10 Essential (primary) hypertension: Secondary | ICD-10-CM | POA: Diagnosis not present

## 2017-05-10 DIAGNOSIS — R293 Abnormal posture: Secondary | ICD-10-CM | POA: Diagnosis not present

## 2017-05-10 DIAGNOSIS — Z23 Encounter for immunization: Secondary | ICD-10-CM

## 2017-05-10 DIAGNOSIS — G20 Parkinson's disease: Secondary | ICD-10-CM | POA: Diagnosis not present

## 2017-05-10 DIAGNOSIS — R41840 Attention and concentration deficit: Secondary | ICD-10-CM

## 2017-05-10 LAB — LIPID PANEL
Cholesterol: 157 mg/dL (ref 0–200)
HDL: 64.4 mg/dL (ref >39.00)
LDL Cholesterol: 79 mg/dL (ref 0–99)
NonHDL: 92.57
Total CHOL/HDL Ratio: 2
Triglycerides: 68 mg/dL (ref 0.0–149.0)
VLDL: 13.6 mg/dL (ref 0.0–40.0)

## 2017-05-10 LAB — COMPREHENSIVE METABOLIC PANEL
ALT: 6 U/L (ref 0–35)
AST: 16 U/L (ref 0–37)
Albumin: 3.9 g/dL (ref 3.5–5.2)
Alkaline Phosphatase: 80 U/L (ref 39–117)
BUN: 21 mg/dL (ref 6–23)
CO2: 29 mEq/L (ref 19–32)
Calcium: 9.4 mg/dL (ref 8.4–10.5)
Chloride: 106 mEq/L (ref 96–112)
Creatinine, Ser: 0.8 mg/dL (ref 0.40–1.20)
GFR: 76.04 mL/min (ref >60.00)
Glucose, Bld: 100 mg/dL — ABNORMAL HIGH (ref 70–99)
Potassium: 4.1 mEq/L (ref 3.5–5.1)
Sodium: 141 mEq/L (ref 135–145)
Total Bilirubin: 0.4 mg/dL (ref 0.2–1.2)
Total Protein: 6.9 g/dL (ref 6.0–8.3)

## 2017-05-10 LAB — HEMOGLOBIN A1C: Hgb A1c MFr Bld: 6.2 % (ref 4.6–6.5)

## 2017-05-10 LAB — CBC
HCT: 37.2 % (ref 36.0–46.0)
Hemoglobin: 11.8 g/dL — ABNORMAL LOW (ref 12.0–15.0)
MCHC: 31.8 g/dL (ref 30.0–36.0)
MCV: 82.6 fl (ref 78.0–100.0)
Platelets: 228 10*3/uL (ref 150.0–400.0)
RBC: 4.51 Mil/uL (ref 3.87–5.11)
RDW: 17 % — ABNORMAL HIGH (ref 11.5–15.5)
WBC: 6.1 10*3/uL (ref 4.0–10.5)

## 2017-05-10 LAB — TSH: TSH: 0.98 u[IU]/mL (ref 0.35–4.50)

## 2017-05-10 LAB — VITAMIN D 25 HYDROXY (VIT D DEFICIENCY, FRACTURES): VITD: 40.93 ng/mL (ref 30.00–100.00)

## 2017-05-10 MED ORDER — ALPRAZOLAM 0.25 MG PO TABS: 0.1250 mg | ORAL_TABLET | Freq: Two times a day (BID) | ORAL | 1 refills | Status: DC | PRN

## 2017-05-10 MED ORDER — TELMISARTAN 80 MG PO TABS: 80.0000 mg | ORAL_TABLET | Freq: Every day | ORAL | 1 refills | Status: DC

## 2017-05-10 NOTE — Assessment & Plan Note (Signed)
Tolerating statin, encouraged heart healthy diet, avoid trans fats, minimize simple carbs and saturated fats. Increase exercise as tolerated 

## 2017-05-10 NOTE — Assessment & Plan Note (Signed)
Check level today 

## 2017-05-10 NOTE — Assessment & Plan Note (Signed)
Encouraged DASH diet, decrease po intake and increase exercise as tolerated. Needs 7-8 hours of sleep nightly. Avoid trans fats, eat small, frequent meals every 4-5 hours with lean proteins, complex carbs and healthy fats. Minimize simple carbs, tried bariatric program but her husband's illness has distracted her. She is going to restart

## 2017-05-10 NOTE — Assessment & Plan Note (Signed)
Anxiety has been flared with her illness and her husband's illness. She cannot tolerate Alprazolam 0.5 mg makes her somnolent. Can try Alprazolam 0.25 mg tabs, 1/2 to 1 prn severe anxiety.

## 2017-05-10 NOTE — Assessment & Plan Note (Addendum)
Well controlled, no changes to meds. Encouraged heart healthy diet such as the DASH diet and exercise as tolerated.has been low at times will change to Telmisartan 90 without hct and reevaluate

## 2017-05-10 NOTE — Patient Instructions (Signed)

## 2017-05-10 NOTE — Therapy (Signed)
[facility] [address] [place], [place], [address] Phone: [phone]   Fax:  [phone]  Occupational Therapy Treatment  Patient Details  Name: Kristin [name] MRN: [id number] Date of Birth: 06-10-1951 Referring Provider: Dr. [name] Tat   Encounter Date: 05/10/2017      OT End of Session - 05/10/17 1325    Visit Number 16   Number of Visits 18  10+8=18   Date for OT Re-Evaluation 05/10/17   Authorization Type Medicare/[employer], G-code needed   Authorization Time Period renewal completed 04/09/17   Authorization - Visit Number 16   Authorization - Number of Visits 20   OT Start Time 1320   OT Stop Time 1400   OT Time Calculation (min) 40 min   Activity Tolerance Patient tolerated treatment well   Behavior During Therapy [facility] for tasks assessed/performed      Past Medical History:  Diagnosis Date  . Abnormal vaginal Pap smear   . Acute pharyngitis 02/25/2013  . Anemia   . Anxiety   . Arthritis   . 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 Chicken pox as a child  . Chronic UTI    sees dr Terance Hart  . Constipation 12/07/2015  . Depression with anxiety 11/02/2009   Qualifier: Diagnosis of  By: [name], LPN, [name]   . Dermatitis 07/17/2012  . Esophageal stricture 1994  . Fibroids   . Foot pain, bilateral 06/19/2012  . GERD (gastroesophageal reflux disease)   . GERD (gastroesophageal reflux disease)   . Hiatal hernia   . Hyperglycemia 08/19/2013  . 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 Hyperlipidemia   . Hypertension   . Infertility, female   . Measles as a child  . Obesity   . Osteoarthritis   . Parkinson disease ([place])   . Plantar fasciitis of left foot 06/19/2012  . Preventative health care 12/19/2015  . Rosacea 10/05/2014  . Swallowing difficulty   . Urinary frequency 02/25/2013  . Visual floaters 05/11/2014    Past Surgical History:  Procedure Laterality Date  . ABDOMINAL  HYSTERECTOMY  2006   total  . esophageal     stretching  . laporoscopy    . PARTIAL HIP ARTHROPLASTY  2010   right  . TONSILLECTOMY    . wisdom teeth extracted      There were no vitals filed for this visit.      Subjective Assessment - 05/10/17 1323    Subjective  I think that this is really helped.  I always put my dishes away using big movements   Pertinent History Parkinson's disease; see Epic   Patient Stated Goals fine motor coordination, improve daily activities   Currently in Pain? No/denies            [facility] OT Assessment - 05/10/17 0001      Coordination   Box and Blocks R-58blocks, L-62blocks           PWR! Hands basic 4 x10 each.  In standing, functional step and reach in diagonal pattern with flipping cards to incorporate large amplitude reach, wt. Shift and trunk rotation, min cueing for incr movement amplitude.     Self Care:  Reviewed use of large amplitude movement techniques for ADLs and implementation of HEP schedule.   Checked remaining goals and discussed progress--see below.  Discussed follow-up.  Pt prefers to schedule evals for OT/PT in approx 6 months.    Pt reports that  she discussed anxiety with PCP and that she changed medication prn.               OT Short Term Goals - 05/10/17 1358      OT SHORT TERM GOAL #1   Title Pt will be independent with HEP.--check STGs 03/16/17   Time 4   Period Weeks   Status Achieved  03/19/17  instructed, but would benefit from review.  04/09/17 met     OT SHORT TERM GOAL #2   Title Pt will verbalize understanding of ways to prevent future complications related to PD.   Period Weeks   Status Achieved  03/22/17     OT SHORT TERM GOAL #3   Title Pt will improve balance and functional reaching for IADLs as shown by improving standing functional reach by at least 2" with RUE.   Baseline R-12", L-9"   Time 4   Period Weeks   Status Achieved  04/09/17:  R-12", L-10".  05/10/17:  R-13", L-12.5"             OT Long Term Goals - 05/10/17 1327      OT LONG TERM GOAL #1   Title Pt will verbalize understanding of adaptive stratgies to incr. safety/ease/independence with ADLs/IADLs (including eating, doffing jacket, buttoning, putting on make-up, donning shoes/socks, scooping food, opening bottles).--check LTGs 04/16/17   Time 8   Period Weeks   Status Achieved     OT LONG TERM GOAL #2   Title Pt will improve coordination for ADLs as shown by improving time on 9-hole peg test by at least 3sec with R hand.   Baseline R-24.85sec   Period Weeks   Status Not Met  05/10/17:  R-23.59sec, L-18.81sec     OT LONG TERM GOAL #3   Title Pt will improve functional reaching/coordination for ADLs as shown by improving score on box and blocks test by at least 3blocks with RUE.   Time 8   Period Weeks   Status Achieved  05/10/17:  R-58blocks, L-62blocks     OT LONG TERM GOAL #4   Title Pt will report incr ease with donning R shoe/sock.   Time 8   Period Weeks   Status Achieved     OT LONG TERM GOAL #5   Title Pt will report incr ease with donning pull-over shirt.   Time 8   Period Weeks   Status Achieved  04/09/17               Plan - 05/10/17 1712    Clinical Impression Statement Pt has made good progress with carryover at home and use of large amplitude movement techniques.   Rehab Potential Good   OT Frequency 2x / week   OT Duration 4 weeks  renewal completed 04/09/17   OT Treatment/Interventions Self-care/ADL training;Energy conservation;Manual Therapy;Passive range of motion;Cognitive remediation/compensation;Functional Mobility Training;Neuromuscular education;Cryotherapy;Therapeutic activities;Therapeutic exercise;Therapeutic exercises;Patient/family education;DME and/or AE instruction;Moist Heat   Plan d/c OT; Pt prefers to follow-up with evaluation in approx 6 months   OT Home Exercise Plan Education provided:   02/27/17  PWR! hands (basic 4), Coordination HEP (card  activiites)   Consulted and Agree with Plan of Care Patient      Patient will benefit from skilled therapeutic intervention in order to improve the following deficits and impairments:  Decreased cognition, Decreased mobility, Impaired UE functional use, Decreased knowledge of use of DME, Decreased balance, Decreased activity tolerance, Impaired tone, Improper spinal/pelvic alignment, Decreased coordination (bradykinesia)  Visit Diagnosis:  Other symptoms and signs involving the musculoskeletal system  Other symptoms and signs involving the nervous system  Other lack of coordination  Abnormal posture  Other abnormalities of gait and mobility  Unsteadiness on feet  Attention and concentration deficit      G-Codes - 06-01-2017 1714    Functional Assessment Tool Used (Outpatient only) difficulty with putting on eye make-up, eating, buttoning, donning R shoe/sock, scooping food, opening bottles, donning pull-over shirt--all improved some with use of strategies   Functional Limitation Self care   Self Care Goal Status (D1761) At least 1 percent but less than 20 percent impaired, limited or restricted   Self Care Discharge Status [phone]) At least 1 percent but less than 20 percent impaired, limited or restricted      Problem List Patient Active Problem List   Diagnosis Date Noted  . Vitamin D deficiency 12/20/2016  . Prediabetes 12/20/2016  . Shortness of breath on exertion 11/27/2016  . Preventative health care 12/19/2015  . Constipation 12/07/2015  . BCC (basal cell carcinoma of skin) 10/05/2014  . Rosacea 10/05/2014  . Parkinson's disease ([place]) 05/11/2014  . Visual floaters 05/11/2014  . Paralysis agitans ([place]) 01/08/2014  . Hyperglycemia 08/19/2013  . Urinary frequency 02/25/2013  . Screening for cervical cancer 07/17/2012  . Foot pain, bilateral 06/19/2012  . 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 Obesity 11/12/2009  . Depression with anxiety 11/02/2009  . 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 Lumbar back pain with radiculopathy affecting left lower extremity 10/17/2007  . Essential hypertension 07/05/2007  . Osteoarthritis 07/05/2007  . Hyperlipidemia 06/26/2007  . H/O: iron deficiency anemia 05/08/2007  . GERD 05/08/2007    OCCUPATIONAL THERAPY DISCHARGE SUMMARY  Visits from Start of Care: 16  Current functional level related to goals / functional outcomes: See above   Remaining deficits: Bradykinesia, rigidity, decr balance for ADLs, decr coordination--all improved; cognitive deficits (established compensation strategies) and tremor   Education / Equipment: Pt instructed in adaptive strategies for ADLs/IADLs, updated PD-specific HEP, ways to prevent future complications, PD education, appropriate community resources.  Plan: Patient agrees to discharge.  Patient goals were partially met. Patient is being discharged due to being pleased with the current functional level.  ?????  Pt would benefit from re-evaluation/occupational therapy eval in approx 6 months to assess for need for further therapy/functional changes due to progressive nature of diagnosis.         [facility] June 01, 2017, 5:16 PM  [facility] [address] [place], [place], [address] Phone: [phone]   Fax:  [phone]  Name: CALEIGH RABELO MRN: [id number] Date of Birth: [birth year]   Vianne Bulls, OTR/L [facility] [address] [place], [place]  [address] [phone] phone [phone] 01-Jun-2017 5:16 PM

## 2017-05-10 NOTE — Assessment & Plan Note (Signed)
hgba1c acceptable, minimize simple carbs. Increase exercise as tolerated.  

## 2017-05-10 NOTE — Assessment & Plan Note (Signed)
Continues to follow up with neurology and has been working with occupational therapy

## 2017-05-10 NOTE — Progress Notes (Signed)
Patient ID: PRACHI OFTEDAHL, female   DOB: 1951/01/10, 66 y.o.   MRN: [id number]   Subjective:    Patient ID: Kyra Searles, female    DOB: [birth year], 66 y.o.   MRN: [id number]  Chief Complaint  Patient presents with  . Follow-up    6 mos for meds    HPI Patient is in today for follow up. She is struggling with a great deal of stress. Her husband continue to undergo cancer treatments at [facility] and after spending 4 months living there they are back home. She had started with the healthy weight and wellness program but being gone she had to stop. She is ready to go back. She is struggling with ongoing weakness, fatigue and tremors secondary to her Parkinson's Disease. Denies CP/palp/SOB/HA/congestion/fevers/GI or GU c/o. Taking meds as prescribed  Past Medical History:  Diagnosis Date  . Abnormal vaginal Pap smear   . Acute pharyngitis 02/25/2013  . Anemia   . Anxiety   . Arthritis   . 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 Chicken pox as a child  . Chronic UTI    sees dr Terance Hart  . Constipation 12/07/2015  . Depression with anxiety 11/02/2009   Qualifier: Diagnosis of  By: [name], LPN, [name]   . Dermatitis 07/17/2012  . Esophageal stricture 1994  . Fibroids   . Foot pain, bilateral 06/19/2012  . GERD (gastroesophageal reflux disease)   . GERD (gastroesophageal reflux disease)   . Hiatal hernia   . Hyperglycemia 08/19/2013  . 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 Hyperlipidemia   . Hypertension   . Infertility, female   . Measles as a child  . Obesity   . Osteoarthritis   . Parkinson disease ([place])   . Plantar fasciitis of left foot 06/19/2012  . Preventative health care 12/19/2015  . Rosacea 10/05/2014  . Swallowing difficulty   . Urinary frequency 02/25/2013  . Visual floaters 05/11/2014    Past Surgical History:  Procedure Laterality Date  . ABDOMINAL HYSTERECTOMY  2006   total  . esophageal     stretching  . laporoscopy    . PARTIAL HIP ARTHROPLASTY   2010   right  . TONSILLECTOMY    . wisdom teeth extracted      Family History  Problem Relation Age of Onset  . Cancer Mother        breast  . Other Mother        arrythmia  . Mental illness Mother        bipolar  . Hyperlipidemia Mother   . Thyroid disease Mother   . Depression Mother   . Bipolar disorder Mother   . Heart disease Father   . Arthritis Father        rheumatoid  . Hypertension Father   . Hyperlipidemia Father   . Depression Sister   . Mental illness Sister        bipolar  . Parkinson's disease Sister   . Arthritis Sister   . Colon cancer Neg Hx   . Esophageal cancer Neg Hx   . Rectal cancer Neg Hx   . Stomach cancer Neg Hx     Social History   Social History  . Marital status: Married    Spouse name: N/A  . Number of children: N/A  . Years of education: N/A   Occupational History  . Retired Teacher, music    Social History Main Topics  . Smoking status:  Never Smoker  . Smokeless tobacco: Never Used  . Alcohol use 2.4 oz/week    4 Glasses of wine per week     Comment: 4 glasses of wine weekly  . Drug use: No  . Sexual activity: Yes    Partners: Male   Other Topics Concern  . Not on file   Social History Narrative   Lives with husband, no major dietary restrictions, retired from teaching      Husband dx with cancer MDS June 2017.   Currently in remission. (11/03/16 pc)          Outpatient Medications Prior to Visit  Medication Sig Dispense Refill  . carbidopa-levodopa (SINEMET CR) 50-200 MG tablet Take 1 tablet by mouth at bedtime. 90 tablet 1  . carbidopa-levodopa (SINEMET IR) 25-100 MG tablet take 1 tablet by mouth three times a day 90 tablet 5  . DULoxetine (CYMBALTA) 30 MG capsule take 1 capsule by mouth once daily 90 capsule 1  . escitalopram (LEXAPRO) 20 MG tablet take 1 tablet by mouth once daily 90 tablet 2  . omeprazole (PRILOSEC) 20 MG capsule take 1 capsule by mouth once daily 90 capsule 1  . polyethylene glycol powder  (GLYCOLAX/MIRALAX) powder Take 17 g by mouth 2 (two) times daily as needed (1 to 2 times per day, as needed). 850 g 0  . pramipexole (MIRAPEX) 0.5 MG tablet TAKE 2 TABLETS BY MOUTH IN THE MORNING 1 TABLET IN THE AFTERNOON AND 1 TABLET IN THE EVENING 360 tablet 3  . rosuvastatin (CRESTOR) 10 MG tablet TAKE 1 TABLET BY MOUTH ON MONDAYS, WEDNESDAYS, AND FRIDAYS 90 tablet 0  . Vitamin D, Ergocalciferol, (DRISDOL) 50000 units CAPS capsule Take 1 capsule (50,000 Units total) by mouth every 7 (seven) days. 4 capsule 0  . ALPRAZolam (XANAX) 0.5 MG tablet Take 1 tablet (0.5 mg total) by mouth as needed for anxiety or sleep. 30 tablet 1  . telmisartan-hydrochlorothiazide (MICARDIS HCT) 80-12.5 MG tablet TAKE 1 TABLET BY MOUTH DAILY (Patient taking differently: Take 0.5 tablets by mouth daily. ) 90 tablet 1   No facility-administered medications prior to visit.     Allergies  Allergen Reactions  . Bactrim [Sulfamethoxazole-Trimethoprim] Other (See Comments)    Oral ulcers and rash  . Penicillins Hives    Review of Systems  Constitutional: Positive for malaise/fatigue. Negative for fever.  HENT: Negative for congestion.   Eyes: Negative for blurred vision.  Respiratory: Negative for shortness of breath.   Cardiovascular: Negative for chest pain, palpitations and leg swelling.  Gastrointestinal: Negative for abdominal pain, blood in stool and nausea.  Genitourinary: Negative for dysuria and frequency.  Musculoskeletal: Negative for falls.  Skin: Negative for rash.  Neurological: Positive for dizziness, tremors and weakness. Negative for loss of consciousness and headaches.  Endo/Heme/Allergies: Negative for environmental allergies.  Psychiatric/Behavioral: Negative for depression. The patient is not nervous/anxious.        Objective:    Physical Exam  Constitutional: She is oriented to person, place, and time. She appears well-developed and well-nourished. No distress.  HENT:  Head:  Normocephalic and atraumatic.  Nose: Nose normal.  Eyes: Right eye exhibits no discharge. Left eye exhibits no discharge.  Neck: Normal range of motion. Neck supple.  Cardiovascular: Normal rate and regular rhythm.   No murmur heard. Pulmonary/Chest: Effort normal and breath sounds normal.  Abdominal: Soft. Bowel sounds are normal. There is no tenderness.  Musculoskeletal: She exhibits no edema.  Neurological: She is alert and oriented to  person, place, and time. She exhibits abnormal muscle tone.  Skin: Skin is warm and dry.  Psychiatric: She has a normal mood and affect.  Nursing note and vitals reviewed.   BP 118/70 (BP Location: Left Arm, Patient Position: Sitting, Cuff Size: Normal)   Pulse 80   Temp 98 F (36.7 C) (Oral)   Ht 5\' 4"  (1.626 m)   Wt 244 lb 6.4 oz (110.9 kg)   SpO2 96%   BMI 41.95 kg/m  Wt Readings from Last 3 Encounters:  05/10/17 244 lb 6.4 oz (110.9 kg)  04/30/17 236 lb (107 kg)  04/16/17 237 lb (107.5 kg)     Lab Results  Component Value Date   WBC 6.1 05/10/2017   HGB 11.8 (L) 05/10/2017   HCT 37.2 05/10/2017   PLT 228.0 05/10/2017   GLUCOSE 100 (H) 05/10/2017   CHOL 157 05/10/2017   TRIG 68.0 05/10/2017   HDL 64.40 05/10/2017   LDLDIRECT 161.5 10/07/2008   LDLCALC 79 05/10/2017   ALT 6 05/10/2017   AST 16 05/10/2017   NA 141 05/10/2017   K 4.1 05/10/2017   CL 106 05/10/2017   CREATININE 0.80 05/10/2017   BUN 21 05/10/2017   CO2 29 05/10/2017   TSH 0.98 05/10/2017   INR 2.5 (H) 01/19/2009   HGBA1C 6.2 05/10/2017    Lab Results  Component Value Date   TSH 0.98 05/10/2017   Lab Results  Component Value Date   WBC 6.1 05/10/2017   HGB 11.8 (L) 05/10/2017   HCT 37.2 05/10/2017   MCV 82.6 05/10/2017   PLT 228.0 05/10/2017   Lab Results  Component Value Date   NA 141 05/10/2017   K 4.1 05/10/2017   CO2 29 05/10/2017   GLUCOSE 100 (H) 05/10/2017   BUN 21 05/10/2017   CREATININE 0.80 05/10/2017   BILITOT 0.4 05/10/2017    ALKPHOS 80 05/10/2017   AST 16 05/10/2017   ALT 6 05/10/2017   PROT 6.9 05/10/2017   ALBUMIN 3.9 05/10/2017   CALCIUM 9.4 05/10/2017   GFR 76.04 05/10/2017   Lab Results  Component Value Date   CHOL 157 05/10/2017   Lab Results  Component Value Date   HDL 64.40 05/10/2017   Lab Results  Component Value Date   LDLCALC 79 05/10/2017   Lab Results  Component Value Date   TRIG 68.0 05/10/2017   Lab Results  Component Value Date   CHOLHDL 2 05/10/2017   Lab Results  Component Value Date   HGBA1C 6.2 05/10/2017       Assessment & Plan:   Problem List Items Addressed This Visit    Hyperlipidemia    Tolerating statin, encouraged heart healthy diet, avoid trans fats, minimize simple carbs and saturated fats. Increase exercise as tolerated      Relevant Medications   telmisartan (MICARDIS) 80 MG tablet   Other Relevant Orders   Lipid panel (Completed)   Obesity    Encouraged DASH diet, decrease po intake and increase exercise as tolerated. Needs 7-8 hours of sleep nightly. Avoid trans fats, eat small, frequent meals every 4-5 hours with lean proteins, complex carbs and healthy fats. Minimize simple carbs, tried bariatric program but her husband's illness has distracted her. She is going to restart      Depression with anxiety    Anxiety has been flared with her illness and her husband's illness. She cannot tolerate Alprazolam 0.5 mg makes her somnolent. Can try Alprazolam 0.25 mg tabs, 1/2 to 1 prn  severe anxiety.      Essential hypertension    Well controlled, no changes to meds. Encouraged heart healthy diet such as the DASH diet and exercise as tolerated.has been low at times will change to Telmisartan 90 without hct and reevaluate      Relevant Medications   telmisartan (MICARDIS) 80 MG tablet   Other Relevant Orders   CBC (Completed)   Comprehensive metabolic panel (Completed)   TSH (Completed)   Hyperglycemia    hgba1c acceptable, minimize simple carbs.  Increase exercise as tolerated.       Relevant Orders   Hemoglobin A1c (Completed)   Parkinson's disease ([place])    Continues to follow up with neurology and has been working with occupational therapy      Vitamin D deficiency    Check level today      Relevant Orders   VITAMIN D 25 Hydroxy (Vit-D Deficiency, Fractures) (Completed)   Prediabetes     minimize simple carbs. Increase exercise as tolerated.       Other Visit Diagnoses    Encounter for immunization       Relevant Orders   Flu vaccine HIGH DOSE PF (Completed)      I have discontinued Ms. Dehnert ALPRAZolam and telmisartan-hydrochlorothiazide. I am also having her start on ALPRAZolam and telmisartan. Additionally, I am having her maintain her pramipexole, carbidopa-levodopa, omeprazole, escitalopram, DULoxetine, polyethylene glycol powder, rosuvastatin, carbidopa-levodopa, and Vitamin D (Ergocalciferol).  Meds ordered this encounter  Medications  . ALPRAZolam (XANAX) 0.25 MG tablet    Sig: Take 0.5-1 tablets (0.125-0.25 mg total) by mouth 2 (two) times daily as needed for anxiety.    Dispense:  30 tablet    Refill:  1  . telmisartan (MICARDIS) 80 MG tablet    Sig: Take 1 tablet (80 mg total) by mouth daily.    Dispense:  90 tablet    Refill:  1     [name], MD

## 2017-05-13 ENCOUNTER — Other Ambulatory Visit: Payer: Self-pay | Admitting: Neurology

## 2017-05-13 NOTE — Assessment & Plan Note (Signed)
minimize simple carbs. Increase exercise as tolerated.  

## 2017-05-14 ENCOUNTER — Ambulatory Visit (INDEPENDENT_AMBULATORY_CARE_PROVIDER_SITE_OTHER): Payer: Medicare Other | Admitting: Physician Assistant

## 2017-05-14 VITALS — BP 105/68 | HR 71 | Temp 97.7°F | Ht 64.0 in | Wt 238.0 lb

## 2017-05-14 DIAGNOSIS — E559 Vitamin D deficiency, unspecified: Secondary | ICD-10-CM

## 2017-05-14 DIAGNOSIS — IMO0001 Reserved for inherently not codable concepts without codable children: Secondary | ICD-10-CM

## 2017-05-14 DIAGNOSIS — Z6841 Body Mass Index (BMI) 40.0 and over, adult: Secondary | ICD-10-CM

## 2017-05-14 DIAGNOSIS — E669 Obesity, unspecified: Secondary | ICD-10-CM

## 2017-05-14 DIAGNOSIS — D508 Other iron deficiency anemias: Secondary | ICD-10-CM

## 2017-05-14 MED ORDER — IRON 325 (65 FE) MG PO TABS: 1.0000 | ORAL_TABLET | Freq: Every day | ORAL | 0 refills | Status: DC

## 2017-05-14 NOTE — Progress Notes (Signed)
Office: [phone]  /  Fax: [phone]   HPI:   Chief Complaint: OBESITY Kristin [name] is here to discuss her progress with her obesity treatment plan. She is on the Category 2 plan and is following her eating plan approximately 25 % of the time. She states she is doing boxing, spin class, yoga and walking for exercise 45 to 60 minutes 5 times per week. Kristin [name] had increased emotional eating and has had more cravings. She is motivated about weight loss and is ready to get back on track. Her weight is 238 lb (108 kg) today and has had a weight gain of 2 pounds over a period of 2 weeks since her last visit. She has lost 6 lbs since starting treatment with [place].  Vitamin D deficiency Kristin [name] has a diagnosis of vitamin D deficiency. She is currently taking vit D and denies nausea, vomiting or muscle weakness.  Iron Deficiency Anemia Kristin [name] has a diagnosis of iron deficiency anemia. Her Hgb is at 11.8 and she is currently taking OTC iron supplementation. She denies chest pain or dyspnea.   ALLERGIES: Allergies  Allergen Reactions  . Bactrim [Sulfamethoxazole-Trimethoprim] Other (See Comments)    Oral ulcers and rash  . Penicillins Hives    MEDICATIONS: Current Outpatient Prescriptions on File Prior to Visit  Medication Sig Dispense Refill  . ALPRAZolam (XANAX) 0.25 MG tablet Take 0.5-1 tablets (0.125-0.25 mg total) by mouth 2 (two) times daily as needed for anxiety. 30 tablet 1  . carbidopa-levodopa (SINEMET CR) 50-200 MG tablet Take 1 tablet by mouth at bedtime. 90 tablet 1  . carbidopa-levodopa (SINEMET IR) 25-100 MG tablet take 1 tablet by mouth three times a day 90 tablet 5  . DULoxetine (CYMBALTA) 30 MG capsule take 1 capsule by mouth once daily 90 capsule 1  . escitalopram (LEXAPRO) 20 MG tablet take 1 tablet by mouth once daily 90 tablet 2  . omeprazole (PRILOSEC) 20 MG capsule take 1 capsule by mouth once daily 90 capsule 1  . polyethylene glycol powder (GLYCOLAX/MIRALAX) powder Take 17 g  by mouth 2 (two) times daily as needed (1 to 2 times per day, as needed). 850 g 0  . pramipexole (MIRAPEX) 0.5 MG tablet TAKE 2 TABLETS BY MOUTH IN THE MORNING AND 1 TABLET IN THE AFTERNOON AND 1 TABLET IN THE EVENING 360 tablet 0  . rosuvastatin (CRESTOR) 10 MG tablet TAKE 1 TABLET BY MOUTH ON MONDAYS, WEDNESDAYS, AND FRIDAYS 90 tablet 0  . telmisartan (MICARDIS) 80 MG tablet Take 1 tablet (80 mg total) by mouth daily. 90 tablet 1  . Vitamin D, Ergocalciferol, (DRISDOL) 50000 units CAPS capsule Take 1 capsule (50,000 Units total) by mouth every 7 (seven) days. 4 capsule 0   No current facility-administered medications on file prior to visit.     PAST MEDICAL HISTORY: Past Medical History:  Diagnosis Date  . Abnormal vaginal Pap smear   . Acute pharyngitis 02/25/2013  . Anemia   . Anxiety   . Arthritis   . 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 Chicken pox as a child  . Chronic UTI    sees dr Terance Hart  . Constipation 12/07/2015  . Depression with anxiety 11/02/2009   Qualifier: Diagnosis of  By: [name], LPN, [name]   . Dermatitis 07/17/2012  . Esophageal stricture 1994  . Fibroids   . Foot pain, bilateral 06/19/2012  . GERD (gastroesophageal reflux disease)   . GERD (gastroesophageal reflux disease)   .  Hiatal hernia   . Hyperglycemia 08/19/2013  . 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 Hyperlipidemia   . Hypertension   . Infertility, female   . Measles as a child  . Obesity   . Osteoarthritis   . Parkinson disease ([place])   . Plantar fasciitis of left foot 06/19/2012  . Preventative health care 12/19/2015  . Rosacea 10/05/2014  . Swallowing difficulty   . Urinary frequency 02/25/2013  . Visual floaters 05/11/2014    PAST SURGICAL HISTORY: Past Surgical History:  Procedure Laterality Date  . ABDOMINAL HYSTERECTOMY  2006   total  . esophageal     stretching  . laporoscopy    . PARTIAL HIP ARTHROPLASTY  2010   right  . TONSILLECTOMY    . wisdom teeth  extracted      SOCIAL HISTORY: Social History  Substance Use Topics  . Smoking status: Never Smoker  . Smokeless tobacco: Never Used  . Alcohol use 2.4 oz/week    4 Glasses of wine per week     Comment: 4 glasses of wine weekly    FAMILY HISTORY: Family History  Problem Relation Age of Onset  . Cancer Mother        breast  . Other Mother        arrythmia  . Mental illness Mother        bipolar  . Hyperlipidemia Mother   . Thyroid disease Mother   . Depression Mother   . Bipolar disorder Mother   . Heart disease Father   . Arthritis Father        rheumatoid  . Hypertension Father   . Hyperlipidemia Father   . Depression Sister   . Mental illness Sister        bipolar  . Parkinson's disease Sister   . Arthritis Sister   . Colon cancer Neg Hx   . Esophageal cancer Neg Hx   . Rectal cancer Neg Hx   . Stomach cancer Neg Hx     ROS: Review of Systems  Constitutional: Negative for weight loss.  Respiratory: Negative for shortness of breath.   Cardiovascular: Negative for chest pain.  Gastrointestinal: Negative for nausea and vomiting.  Musculoskeletal:       Negative muscle weakness    PHYSICAL EXAM: Blood pressure 105/68, pulse 71, temperature 97.7 F (36.5 C), temperature source Oral, height 5\' 4"  (1.626 m), weight 238 lb (108 kg), SpO2 96 %. Body mass index is 40.85 kg/m. Physical Exam  Constitutional: She is oriented to person, place, and time. She appears well-developed and well-nourished.  Cardiovascular: Normal rate.   Pulmonary/Chest: Effort normal.  Musculoskeletal: Normal range of motion.  Neurological: She is oriented to person, place, and time.  Skin: Skin is warm and dry.  Psychiatric: She has a normal mood and affect. Her behavior is normal.  Vitals reviewed.   RECENT LABS AND TESTS: BMET    Component Value Date/Time   NA 141 05/10/2017 0910   NA 141 11/27/2016 1123   K 4.1 05/10/2017 0910   CL 106 05/10/2017 0910   CO2 29 05/10/2017  0910   GLUCOSE 100 (H) 05/10/2017 0910   BUN 21 05/10/2017 0910   BUN 13 11/27/2016 1123   CREATININE 0.80 05/10/2017 0910   CREATININE 0.71 01/04/2015 1046   CALCIUM 9.4 05/10/2017 0910   GFRNONAA 83 11/27/2016 1123   GFRNONAA >89 01/04/2015 1046   GFRAA 96 11/27/2016 1123   GFRAA >89 01/04/2015 1046   Lab Results  Component Value  Date   HGBA1C 6.2 05/10/2017   HGBA1C 6.1 (H) 11/27/2016   HGBA1C 6.2 11/03/2016   HGBA1C 6.0 05/04/2016   HGBA1C 6.1 12/07/2015   Lab Results  Component Value Date   INSULIN 18.0 11/27/2016   CBC    Component Value Date/Time   WBC 6.1 05/10/2017 0910   RBC 4.51 05/10/2017 0910   HGB 11.8 (L) 05/10/2017 0910   HGB 12.2 11/27/2016 1123   HCT 37.2 05/10/2017 0910   HCT 38.3 11/27/2016 1123   PLT 228.0 05/10/2017 0910   MCV 82.6 05/10/2017 0910   MCV 89 11/27/2016 1123   MCH 28.3 11/27/2016 1123   MCH 31.6 04/07/2013 0923   MCHC 31.8 05/10/2017 0910   RDW 17.0 (H) 05/10/2017 0910   RDW 15.3 11/27/2016 1123   LYMPHSABS 1.8 11/27/2016 1123   EOSABS 0.1 11/27/2016 1123   BASOSABS 0.0 11/27/2016 1123   Iron/TIBC/Ferritin/ %Sat    Component Value Date/Time   IRON 31 03/05/2017 1224   TIBC 402 03/05/2017 1224   FERRITIN 12 (L) 03/05/2017 1224   IRONPCTSAT 8 (LL) 03/05/2017 1224   Lipid Panel     Component Value Date/Time   CHOL 157 05/10/2017 0910   CHOL 180 11/27/2016 1123   TRIG 68.0 05/10/2017 0910   HDL 64.40 05/10/2017 0910   HDL 57 11/27/2016 1123   CHOLHDL 2 05/10/2017 0910   VLDL 13.6 05/10/2017 0910   LDLCALC 79 05/10/2017 0910   LDLCALC 88 11/27/2016 1123   LDLDIRECT 161.5 10/07/2008 0944   Hepatic Function Panel     Component Value Date/Time   PROT 6.9 05/10/2017 0910   PROT 6.9 11/27/2016 1123   ALBUMIN 3.9 05/10/2017 0910   ALBUMIN 4.3 11/27/2016 1123   AST 16 05/10/2017 0910   ALT 6 05/10/2017 0910   ALKPHOS 80 05/10/2017 0910   BILITOT 0.4 05/10/2017 0910   BILITOT 0.4 11/27/2016 1123   BILIDIR 0.1  05/05/2014 0948   IBILI 0.4 04/07/2013 0923      Component Value Date/Time   TSH 0.98 05/10/2017 0910   TSH 1.480 11/27/2016 1123   TSH 0.87 11/03/2016 1030    ASSESSMENT AND PLAN: Other iron deficiency anemia - Plan: Ferrous Sulfate (IRON) 325 (65 Fe) MG TABS  Vitamin D deficiency  Class 3 obesity with serious comorbidity and body mass index (BMI) of 40.0 to 44.9 in adult, unspecified obesity type ([place])  PLAN:  Vitamin D Deficiency Kristin [name] was informed that low vitamin D levels contributes to fatigue and are associated with obesity, breast, and colon cancer. She agrees to continue to take prescription Vit D @50 ,000 IU every week and will follow up for routine testing of vitamin D, at least 2-3 times per year. She was informed of the risk of over-replacement of vitamin D and agrees to not increase her dose unless he discusses this with [place] first.  Iron Deficiency Anemia The diagnosis of Iron deficiency anemia was discussed with Kristin [name] and was explained in detail. She was given suggestions of iron rich foods and she agrees to continue to take OTC iron supplement 65 mg once daily and she will follow up with our clinic in 3 weeks.   Obesity Kristin [name] is currently in the action stage of change. As such, her goal is to continue with weight loss efforts She has agreed to follow the Category 2 plan Kristin [name] has been instructed to work up to a goal of 150 minutes of combined cardio and strengthening exercise per week for weight loss and  overall health benefits. We discussed the following Behavioral Modification Strategies today: increasing lean protein intake and keeping healthy foods in the home  Kristin [name] has agreed to follow up with our clinic in 3 weeks. She was informed of the importance of frequent follow up visits to maximize her success with intensive lifestyle modifications for her multiple health conditions.  I, Doreene Nest, am acting as transcriptionist for Kristin Duverney, PA-C  I have reviewed the  above documentation for accuracy and completeness, and I agree with the above. -Kristin Duverney, PA-C  I have reviewed the above note and agree with the plan. -Dennard Nip, MD   OBESITY BEHAVIORAL INTERVENTION VISIT  Today's visit was # 10 out of 22.  Starting weight: 244 lbs Starting date: 11/27/16 Today's weight : 238 lbs  Today's date: 05/14/2017 Total lbs lost to date: 6 (Patients must lose 7 lbs in the first 6 months to continue with counseling)   ASK: We discussed the diagnosis of obesity with Kristin [name] today and Kristin [name] agreed to give [place] permission to discuss obesity behavioral modification therapy today.  ASSESS: Kristin [name] has the diagnosis of obesity and her BMI today is 71.83 Kristin [name] is in the action stage of change   ADVISE: Kristin [name] was educated on the multiple health risks of obesity as well as the benefit of weight loss to improve her health. She was advised of the need for long term treatment and the importance of lifestyle modifications.  AGREE: Multiple dietary modification options and treatment options were discussed and  Kristin [name] agreed to follow the Category 2 plan We discussed the following Behavioral Modification Strategies today: increasing lean protein intake and keeping healthy foods in the home

## 2017-05-17 DIAGNOSIS — F320 Major depressive disorder, single episode, mild: Secondary | ICD-10-CM | POA: Diagnosis not present

## 2017-05-28 ENCOUNTER — Ambulatory Visit (INDEPENDENT_AMBULATORY_CARE_PROVIDER_SITE_OTHER): Payer: Medicare Other | Admitting: Physician Assistant

## 2017-05-28 ENCOUNTER — Encounter (INDEPENDENT_AMBULATORY_CARE_PROVIDER_SITE_OTHER): Payer: Self-pay

## 2017-05-31 DIAGNOSIS — F320 Major depressive disorder, single episode, mild: Secondary | ICD-10-CM | POA: Diagnosis not present

## 2017-06-07 DIAGNOSIS — F331 Major depressive disorder, recurrent, moderate: Secondary | ICD-10-CM | POA: Diagnosis not present

## 2017-06-18 NOTE — Progress Notes (Signed)
Subjective:    Kristin [name] was seen in consultation in the movement disorder clinic at the request of Mosie Lukes, MD.  The evaluation is for tremor.  The patient is a 66 y.o. left handed female with a history of tremor.  The records that were made available to me were reviewed.  According to records, the patient has complained about tremor to her previous primary care physician all the way back to 2012, but in those records she had said that tremor had been going on for years.  She states today, however, that she really thinks that it started in 2012.  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  R hand was always worse even though she is L hand dominant.  It has slowly progressed.  She reports that tremor is in both hands now (R still worse) but she feels tremor on the inside of the body all of the time.  She has tremor at rest now.  She feels nervous now and is unsure if it was related.  She also has the sweats and so she was placed on xanax but that doesn't help the sweating.  She recently went to an exercise class and noted that she wasn't as coordinated/strong with the R hand and that worried her and that is why she wanted a further evaluation.    She did see [name] in June, 2013 and I reviewed that note.  She was diagnosed with essential tremor.  No treatment was recommended.  There is  family hx of tremor in her father.  She states that she has been on metoprolol since 2012 and didn't think that it helped but when she tried to get off of it a year ago, the tremor got worse and she couldn't get off of it.    03/09/14 update:  The patient presents today for follow up.  Today, she is accompanied by her husband who helps to supplement the history.  He was not present on the prior visit.  She was diagnosed with PD last visit.  I started her on Mirapex.  She has been doing better in terms of tremor.  Interestingly, sweats and sleep have been better.  She  just retired last week.  No side effects with the Mirapex.  She is currently taking it at 9 AM/2 PM/9 PM. No compulsive behaviors.  No sleep attacks.  She has been attending therapy.  She is planning on starting the Parkinson's exercise class.  She has been educating herself and reading patient education material.  She had a modified barium evaluation on 02/25/2014.  It was normal, although they suspected that she had mild esophageal dysphagia.  Her husband asks multiple questions today and asked about potentially having another opinion.  The patient has had no falls.  No hallucinations.  07/15/14 update:  Pt is f/u today, accompanied by her husband who supplements the history.  Pt is on mirapex 0.5 mg three times per day.  I stopped her metoprolol last visit. She did fine with that. She is exercising with the Moves class and with circuit II class.  She is also enrolled in the bike class at the [facility].  She is having some soreness because of the exercise and hip pain because of it and she has had a hip replacement and worries about that as she doesn't want to have another.  Thinking about trying chiropractics for that.  She asks me about some.  Stages that she has on the lateral aspect of the knees.  She does not wear boots.  She has not had a knee replacement.  She also complains of some achiness in her toes and it gets better as she walks.  No falls.  No lightheadedness.    11/11/14 update:  The patient returns today for follow-up.  She is on Mirapex, 0.5 mg 3 times per day.  Since our last visit, the patient did go to [facility] for a second opinion.  She saw Dr. [name].  This was just 2 days ago.  No notes are available.  They state that they had a good visit, are glad that they went and got a second opinion and no new recommendations/treatments were given.  She has been doing well at home.  She is very active in our community exercise programs for PD.  She is enrolled in the [facility] exercise program and in our  circuit class and is walking on the treadmill.  She notices tremor in public but generally not as much at home.  She notes wearing off of the medication but only when time for the next dosage.    02/09/15 update:  The patient is following up today regarding her Parkinson's disease.  She is accompanied by her husband who supplements the history.  She did get a second opinion at [place] with Dr. [name] and his fellow, and [name], on 11/17/2014.  I reviewed those notes.  I told her that she could increase the Mirapex if needed to control tremor further.  She is currently on Mirapex 0.5 mg 3 times per day (7am/1:30 pm/7:00).  She is on Lexapro and Cymbalta for depression.  She is doing exercise.   She went to circuit class for PD today and she will have tremor when doing that.  She notes that when doing exercise, she has more tremor.  No falls.  She went to [place] for 2 weeks with a tour group and she felt that she was able to keep up with the group.  She felt better on this trip than she did 2 years ago on a trip.  05/12/15 update:  The patient falls up today regarding her Parkinson's disease. She is accompanied by her husband who supplements the history.   Last visit, I had her increase her Mirapex to 1 mg in the morning and she remained on 0.5 mg in the afternoon and 0.5 mg in the evening. She was initially tired with the increase in the medication but has adjusted now.  Her daughter and 8 month old grandson are living with them.  She is exercising on the treadmill.  She is doing the Parkinsons exercise class.  She is going to try the boxing class and is in the [employer] class and it is increasing to 2 days a week. She has been doing well with mood on a combination of Lexapro and Cymbalta.  She denies any falls.  No lightheadedness or near syncope.  No hallucinations.  08/12/15 update:  The patient follows up today, accompanied by her husband who supplements the history.  The patient is on pramipexole, 1.0 mg in the  morning, 0.5 mg in the afternoon and 0.5 mg in the evening.  She is also planning on attending the [facility] retreat as a patient, which should be very good for her.  She remains on Lexapro and Cymbalta.  They're working well for her.  She is thinking about getting a therapist and asks me about  that.  She also asks me about possible supplements for Parkinson's disease.  She continues to exercise a lot.  She has not had any falls.  No dyskinesia.  No lightheadedness or near syncope.  12/01/15 update:  The patient follows up today, accompanied by her husband who supplements the history.  The patient is on pramipexole, 1.0 mg in the morning, 0.5 mg in the afternoon and 0.5 mg in the evening.  She is also planning on attending the [facility] retreat as a patient, which should be very good for her.  Its in [place].  She remains on Lexapro and Cymbalta.  They're working well for her.  She continues to exercise a lot.  Had not been pole walking and just started and noted that her arms are very sore.  Still knows that she needs a therapist and asked about it last time and gave her Valeda Malm name but she hasn't done it.  She has not had any falls.  No dyskinesia.  No lightheadedness or near syncope.  She just saw Dr. [name] on 11/08/15 and I reviewed those notes.  No changes were made.    03/14/16 update:  The patient follows up today for her PD.  The patient is on pramipexole, 1.0 mg in the morning, 0.5 mg in the afternoon and 0.5 mg in the evening.  She attended the [facility] retreat in [place] as a patient.  She went at the end of May.  States that it was very challenging.   She remains on Lexapro and Cymbalta.  They're working well for her.  She continues to exercise a lot.  She has not had any falls.  No dyskinesia.  No lightheadedness or near syncope.  Noting some turning in of the L ankle. Not sure if related to timing of medication.   States that husband was dx with MDS since our last visit.  Because of that, she has not she has  not been able to attend PD therapy locally because she has been running back and forth to [place] with him.  Sister with dx with PD since our last visit.    10/31/16 update:  Patient follows up today.  She is on pramipexole, 1 mg in the morning, 0.5 mg in the afternoon and evening.  Last visit, we initiated levodopa and she worked her way to 1 tablet 3 times per day.  She has not had side effects.  No dyskinesia.  No falls.  Is having some dizziness or near syncope.  She admits that she is not hydrating well.  She is having more choking on water/liquid/saliva.  She is  planning to attend the [facility] retreat again in [place].  She has had a fairly rough time since our last visit, as she was in [place] with several months with her husband who had been awaiting a bone marrow transplant for leukemia.  He had this 06/2016.  He still won't leave the house because he is scared of germs but he has been given the clearance to do so.  She did join a gym and was exercising while she was in [place].  Pt is going to counseling, [employer] but her husband has been resistant to do so.  Pt went to social work PD support group.  She is getting back to exercise here.  She has started back to spears biking and [facility] classes.  She feels a bit defeated about how hard the boxing class is.   03/05/17 update:  Patient seen today  in follow-up.  She is on pramipexole, 1 mg in the morning and 0.5 mg in the afternoon and evening.  She is still on carbidopa/levodopa 25/100, one tablet 3 times per day.  Some cramping of the 2nd toe at night bilaterally.  She has been to the [employer] and [place] again since our last visit and did well with that.  Her sister did go with her.  Pt denies falls.  Finished PT and doing OT now.  Pt denies lightheadedness, near syncope.  No hallucinations.  Mood has been good. Still seeing Trevor Mace, her counselor.  She has an adult autistic son that has a college degree but has trouble keeping a job and that has  been stressful for her. Is seeing Dr. Leafy Ro now regarding weight loss.  Having some joint pain.  Going to see knee doctor as well.  Lets feel "itchy" at night and she is scratching them at night and getting exoriations on them from scratching.  06/19/17 update:  Patient today in follow-up for her Parkinson's disease.  Patient is on carbidopa/levodopa 25/100, one tablet 3 times per day.  We added carbidopa/levodopa 50/200 at bedtime last visit to see if that would help the restless leg and cramping of the feet and toes at night.  She states that helped a lot.  She is also on pramipexole, 1 mg in the morning and 0.5 mg in the afternoon and evening.  She did come to our Parkinson's symposium in July, accompanied by her husband.  She was pleased that he came to the event.  She denies any falls since our last visit.  She continues to exercise faithfully.  She is having a hard time emotionally doing everything for her husband.  She is going to counseling.  shes getting tired so she gave up boxing.  She is going to spin class.  She is eating to cope with depression.  Current/Previously tried tremor medications: metoprololol  Current medications that may exacerbate tremor:  Cymbalta, although I reviewed that the patient did try to change to Pristiq but the patient had nightmares with Pristiq and changed back to Cymbalta  Outside reports reviewed: historical medical records, lab reports and referral letter/letters.  Allergies  Allergen Reactions  . Bactrim [Sulfamethoxazole-Trimethoprim] Other (See Comments)    Oral ulcers and rash  . Penicillins Hives    Current Outpatient Prescriptions on File Prior to Visit  Medication Sig Dispense Refill  . ALPRAZolam (XANAX) 0.25 MG tablet Take 0.5-1 tablets (0.125-0.25 mg total) by mouth 2 (two) times daily as needed for anxiety. 30 tablet 1  . carbidopa-levodopa (SINEMET CR) 50-200 MG tablet Take 1 tablet by mouth at bedtime. 90 tablet 1  . carbidopa-levodopa  (SINEMET IR) 25-100 MG tablet take 1 tablet by mouth three times a day 90 tablet 5  . DULoxetine (CYMBALTA) 30 MG capsule take 1 capsule by mouth once daily 90 capsule 1  . escitalopram (LEXAPRO) 20 MG tablet take 1 tablet by mouth once daily 90 tablet 2  . omeprazole (PRILOSEC) 20 MG capsule take 1 capsule by mouth once daily 90 capsule 1  . polyethylene glycol powder (GLYCOLAX/MIRALAX) powder Take 17 g by mouth 2 (two) times daily as needed (1 to 2 times per day, as needed). 850 g 0  . pramipexole (MIRAPEX) 0.5 MG tablet TAKE 2 TABLETS BY MOUTH IN THE MORNING AND 1 TABLET IN THE AFTERNOON AND 1 TABLET IN THE EVENING 360 tablet 0  . rosuvastatin (CRESTOR) 10 MG tablet TAKE  1 TABLET BY MOUTH ON MONDAYS, WEDNESDAYS, AND FRIDAYS 90 tablet 0  . telmisartan (MICARDIS) 80 MG tablet Take 1 tablet (80 mg total) by mouth daily. 90 tablet 1  . Ferrous Sulfate (IRON) 325 (65 Fe) MG TABS Take 1 tablet (325 mg total) by mouth daily. (Patient not taking: Reported on 06/19/2017) 30 each 0   No current facility-administered medications on file prior to visit.     Past Medical History:  Diagnosis Date  . Abnormal vaginal Pap smear   . Acute pharyngitis 02/25/2013  . Anemia   . Anxiety   . Arthritis   . 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 Chicken pox as a child  . Chronic UTI    sees dr Terance Hart  . Constipation 12/07/2015  . Depression with anxiety 11/02/2009   Qualifier: Diagnosis of  By: [name], LPN, [name]   . Dermatitis 07/17/2012  . Esophageal stricture 1994  . Fibroids   . Foot pain, bilateral 06/19/2012  . GERD (gastroesophageal reflux disease)   . GERD (gastroesophageal reflux disease)   . Hiatal hernia   . Hyperglycemia 08/19/2013  . 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 Hyperlipidemia   . Hypertension   . Infertility, female   . Measles as a child  . Obesity   . Osteoarthritis   . Parkinson disease ([place])   . Plantar fasciitis of left foot 06/19/2012  .  Preventative health care 12/19/2015  . Rosacea 10/05/2014  . Swallowing difficulty   . Urinary frequency 02/25/2013  . Visual floaters 05/11/2014    Past Surgical History:  Procedure Laterality Date  . ABDOMINAL HYSTERECTOMY  2006   total  . esophageal     stretching  . laporoscopy    . PARTIAL HIP ARTHROPLASTY  2010   right  . TONSILLECTOMY    . wisdom teeth extracted      Social History   Social History  . Marital status: Married    Spouse name: N/A  . Number of children: N/A  . Years of education: N/A   Occupational History  . Retired Teacher, music    Social History Main Topics  . Smoking status: Never Smoker  . Smokeless tobacco: Never Used  . Alcohol use 2.4 oz/week    4 Glasses of wine per week     Comment: 4 glasses of wine weekly  . Drug use: No  . Sexual activity: Yes    Partners: Male   Other Topics Concern  . Not on file   Social History Narrative   Lives with husband, no major dietary restrictions, retired from teaching      Husband dx with cancer MDS June 2017.   Currently in remission. (11/03/16 pc)          Family Status  Relation Status  . Mother Deceased at age 31       Dementia, breast cancer, bipolar  . Father Deceased at age 38       heart failure, rheumatoid arthritis  . Sister Alive       arthritis, PD  . Sister Alive  . Daughter Alive       30  . Son Alive       95  . MGM Deceased  . MGF Deceased  . PGM Deceased  . PGF Deceased  . Sister Alive        healthy  . Sister Alive       healthy  . Daughter Alive  28/ healthy  . Neg Hx (Not Specified)    Review of Systems A complete 10 system ROS was obtained and was negative apart from what is mentioned.   Objective:   VITALS:   Vitals:   06/19/17 1422  BP: 122/74  Pulse: 96  SpO2: 94%  Weight: 245 lb (111.1 kg)  Height: 5\' 4"  (1.626 m)   Wt Readings from Last 3 Encounters:  06/19/17 245 lb (111.1 kg)  05/14/17 238 lb (108 kg)  05/10/17 244 lb 6.4 oz  (110.9 kg)     Gen:  Appears stated age and in NAD.  She is diaphoretic (room is also hot) HEENT:  Normocephalic, atraumatic. The mucous membranes are moist.  Cardiovascular: Regular rate and rhythm Lungs: Clear to auscultation bilaterally Neck: No carotid bruits  NEUROLOGICAL:  Orientation: The patient is alert and oriented x3. Fund of knowledge is appropriate.  Recent and remote memory are intact.  Attention and concentration are normal.    Able to name objects and repeat phrases. Cranial nerves: There is good facial symmetry. The visual fields are full to confrontational testing. The speech is fluent and clear. Soft palate rises symmetrically and there is no tongue deviation. Hearing is intact to conversational tone. Sensation: Sensation is intact to light touch throughout. Motor: Strength is 5/5 in the bilateral upper and lower extremities.   Shoulder shrug is equal and symmetric.  There is no pronator drift.   Movement examination: Tone: There is normal tone in the upper and lower extremity. Abnormal movements: There is a mild RUE tremor. There is no tremor of her other extremities today. Coordination:  There is no decremation with any form of RAMS, including alternating supination and pronation of the forearm, hand opening and closing, finger taps, heel taps and toe taps bilaterally. Gait and Station: The patient has no significant difficulty arising out of a deep-seated chair without the use of the hands. The patient's stride length is fairly normal, and there is good, albeit somewhat purposeful, arm swing bilaterally.  LABS  Lab Results  Component Value Date   TSH 0.98 05/10/2017   Lab Results  Component Value Date   WBC 6.1 05/10/2017   HGB 11.8 (L) 05/10/2017   HCT 37.2 05/10/2017   MCV 82.6 05/10/2017   PLT 228.0 05/10/2017     Chemistry      Component Value Date/Time   NA 141 05/10/2017 0910   NA 141 11/27/2016 1123   K 4.1 05/10/2017 0910   CL 106 05/10/2017  0910   CO2 29 05/10/2017 0910   BUN 21 05/10/2017 0910   BUN 13 11/27/2016 1123   CREATININE 0.80 05/10/2017 0910   CREATININE 0.71 01/04/2015 1046      Component Value Date/Time   CALCIUM 9.4 05/10/2017 0910   ALKPHOS 80 05/10/2017 0910   AST 16 05/10/2017 0910   ALT 6 05/10/2017 0910   BILITOT 0.4 05/10/2017 0910   BILITOT 0.4 11/27/2016 1123     Lab Results  Component Value Date   WBC 6.1 05/10/2017   HGB 11.8 (L) 05/10/2017   HCT 37.2 05/10/2017   MCV 82.6 05/10/2017   PLT 228.0 05/10/2017   Lab Results  Component Value Date   IRON 31 03/05/2017   TIBC 402 03/05/2017   FERRITIN 12 (L) 03/05/2017        Assessment/Plan:   1. idiopathic Parkinson's disease.  The patient has tremor, bradykinesia, rigidity and mild postural instability.  -continue pramipexole 1 mg in the morning and  continue the 0.5 mg in the afternoon and evening.  She has had no compulsive side effects.  Risks, benefits, side effects and alternative therapies were discussed.  The opportunity to ask questions was given and they were answered to the best of my ability.  The patient expressed understanding and willingness to follow the outlined treatment protocols.  -Continue carbidopa/levodopa 25/100 one tablet tid.  Risks, benefits, side effects and alternative therapies were discussed.  The opportunity to ask questions was given and they were answered to the best of my ability.  The patient expressed understanding and willingness to follow the outlined treatment protocols.  -She will continue Carbidopa/levodopa 50/200 at night.  This has definitely helped restless leg and has helped most of the nighttime cramping of the feet and legs.  -needs to add back in boxing.  -she asked me about DaT scan.  I told her that there was no point in doing that as she has PD via criteria. 2.  Depression and anxiety  - She is on a combination of Lexapro and Cymbalta and she is doing well.    -She is now seeing a counselor  and will continue to do so  -Talked about trying to get her husband to see a counselor or to go to counseling with her. 3.  Dizziness  -improved 4.  Dysphagia  -last MBE 12/2015.  Liquids becoming slightly more problematic.  Talked about chin tuck maneuver.  Use straw.   5.  Follow up is anticipated in the next few months, sooner should new neurologic issues arise.  Much greater than 50% of this visit was spent in counseling and coordinating care.  Total face to face time:  30 min.  Gave information for our new social worker and encouraged patient to reach out to her.

## 2017-06-19 ENCOUNTER — Encounter: Payer: Self-pay | Admitting: Neurology

## 2017-06-19 ENCOUNTER — Ambulatory Visit (INDEPENDENT_AMBULATORY_CARE_PROVIDER_SITE_OTHER): Payer: Medicare Other | Admitting: Neurology

## 2017-06-19 VITALS — BP 122/74 | HR 96 | Ht 64.0 in | Wt 245.0 lb

## 2017-06-19 DIAGNOSIS — G2581 Restless legs syndrome: Secondary | ICD-10-CM

## 2017-06-19 DIAGNOSIS — G20 Parkinson's disease: Secondary | ICD-10-CM | POA: Diagnosis not present

## 2017-06-19 DIAGNOSIS — G20A1 Parkinson's disease without dyskinesia, without mention of fluctuations: Secondary | ICD-10-CM

## 2017-06-19 NOTE — Patient Instructions (Signed)
You look good.  Get back to [place] steady boxing.

## 2017-06-21 DIAGNOSIS — F331 Major depressive disorder, recurrent, moderate: Secondary | ICD-10-CM | POA: Diagnosis not present

## 2017-06-23 ENCOUNTER — Other Ambulatory Visit: Payer: Self-pay | Admitting: Family Medicine

## 2017-06-23 DIAGNOSIS — K219 Gastro-esophageal reflux disease without esophagitis: Secondary | ICD-10-CM

## 2017-06-25 ENCOUNTER — Other Ambulatory Visit: Payer: Self-pay | Admitting: Neurology

## 2017-06-28 ENCOUNTER — Other Ambulatory Visit (INDEPENDENT_AMBULATORY_CARE_PROVIDER_SITE_OTHER): Payer: Self-pay | Admitting: Family Medicine

## 2017-06-28 DIAGNOSIS — E559 Vitamin D deficiency, unspecified: Secondary | ICD-10-CM

## 2017-07-05 DIAGNOSIS — F331 Major depressive disorder, recurrent, moderate: Secondary | ICD-10-CM | POA: Diagnosis not present

## 2017-07-06 ENCOUNTER — Other Ambulatory Visit: Payer: Self-pay | Admitting: Family Medicine

## 2017-07-09 ENCOUNTER — Ambulatory Visit (INDEPENDENT_AMBULATORY_CARE_PROVIDER_SITE_OTHER): Payer: Medicare Other | Admitting: Family Medicine

## 2017-07-09 DIAGNOSIS — G20 Parkinson's disease: Secondary | ICD-10-CM | POA: Diagnosis not present

## 2017-07-09 DIAGNOSIS — E785 Hyperlipidemia, unspecified: Secondary | ICD-10-CM | POA: Diagnosis not present

## 2017-07-09 DIAGNOSIS — I10 Essential (primary) hypertension: Secondary | ICD-10-CM | POA: Diagnosis not present

## 2017-07-09 DIAGNOSIS — E559 Vitamin D deficiency, unspecified: Secondary | ICD-10-CM

## 2017-07-09 DIAGNOSIS — E669 Obesity, unspecified: Secondary | ICD-10-CM

## 2017-07-09 MED ORDER — ESCITALOPRAM OXALATE 20 MG PO TABS: 20.0000 mg | ORAL_TABLET | Freq: Every day | ORAL | 2 refills | Status: DC

## 2017-07-09 NOTE — Patient Instructions (Signed)
Shingrix is the new shingles shot, 2 shots over 6 months, at pharmacy   Carbohydrate Counting for Diabetes Mellitus, Adult Carbohydrate counting is a method for keeping track of how many carbohydrates you eat. Eating carbohydrates naturally increases the amount of sugar (glucose) in the blood. Counting how many carbohydrates you eat helps keep your blood glucose within normal limits, which helps you manage your diabetes (diabetes mellitus). It is important to know how many carbohydrates you can safely have in each meal. This is different for every person. A diet and nutrition specialist (registered dietitian) can help you make a meal plan and calculate how many carbohydrates you should have at each meal and snack. Carbohydrates are found in the following foods:  Grains, such as breads and cereals.  Dried beans and soy products.  Starchy vegetables, such as potatoes, peas, and corn.  Fruit and fruit juices.  Milk and yogurt.  Sweets and snack foods, such as cake, cookies, candy, chips, and soft drinks.  How do I count carbohydrates? There are two ways to count carbohydrates in food. You can use either of the methods or a combination of both. Reading "Nutrition Facts" on packaged food The "Nutrition Facts" list is included on the labels of almost all packaged foods and beverages in the [place] It includes:  The serving size.  Information about nutrients in each serving, including the grams (g) of carbohydrate per serving.  To use the "Nutrition Facts":  Decide how many servings you will have.  Multiply the number of servings by the number of carbohydrates per serving.  The resulting number is the total amount of carbohydrates that you will be having.  Learning standard serving sizes of other foods When you eat foods containing carbohydrates that are not packaged or do not include "Nutrition Facts" on the label, you need to measure the servings in order to count the amount of  carbohydrates:  Measure the foods that you will eat with a food scale or measuring cup, if needed.  Decide how many standard-size servings you will eat.  Multiply the number of servings by 15. Most carbohydrate-rich foods have about 15 g of carbohydrates per serving. ? For example, if you eat 8 oz (170 g) of strawberries, you will have eaten 2 servings and 30 g of carbohydrates (2 servings x 15 g = 30 g).  For foods that have more than one food mixed, such as soups and casseroles, you must count the carbohydrates in each food that is included.  The following list contains standard serving sizes of common carbohydrate-rich foods. Each of these servings has about 15 g of carbohydrates:   hamburger bun or  English muffin.   oz (15 mL) syrup.   oz (14 g) jelly.  1 slice of bread.  1 six-inch tortilla.  3 oz (85 g) cooked rice or pasta.  4 oz (113 g) cooked dried beans.  4 oz (113 g) starchy vegetable, such as peas, corn, or potatoes.  4 oz (113 g) hot cereal.  4 oz (113 g) mashed potatoes or  of a large baked potato.  4 oz (113 g) canned or frozen fruit.  4 oz (120 mL) fruit juice.  4-6 crackers.  6 chicken nuggets.  6 oz (170 g) unsweetened dry cereal.  6 oz (170 g) plain fat-free yogurt or yogurt sweetened with artificial sweeteners.  8 oz (240 mL) milk.  8 oz (170 g) fresh fruit or one small piece of fruit.  24 oz (680 g) popped  popcorn.  Example of carbohydrate counting Sample meal  3 oz (85 g) chicken breast.  6 oz (170 g) brown rice.  4 oz (113 g) corn.  8 oz (240 mL) milk.  8 oz (170 g) strawberries with sugar-free whipped topping. Carbohydrate calculation 1. Identify the foods that contain carbohydrates: ? Rice. ? Corn. ? Milk. ? Strawberries. 2. Calculate how many servings you have of each food: ? 2 servings rice. ? 1 serving corn. ? 1 serving milk. ? 1 serving strawberries. 3. Multiply each number of servings by 15 g: ? 2 servings  rice x 15 g = 30 g. ? 1 serving corn x 15 g = 15 g. ? 1 serving milk x 15 g = 15 g. ? 1 serving strawberries x 15 g = 15 g. 4. Add together all of the amounts to find the total grams of carbohydrates eaten: ? 30 g + 15 g + 15 g + 15 g = 75 g of carbohydrates total. This information is not intended to replace advice given to you by your health care provider. Make sure you discuss any questions you have with your health care provider. Document Released: 08/21/2005 Document Revised: 03/10/2016 Document Reviewed: 02/02/2016 Elsevier Interactive Patient Education  [employer].

## 2017-07-09 NOTE — Assessment & Plan Note (Signed)
Encouraged heart healthy diet, increase exercise, avoid trans fats, consider a krill oil cap daily 

## 2017-07-09 NOTE — Assessment & Plan Note (Signed)
WNL on recent check. Continue daily supplement

## 2017-07-09 NOTE — Assessment & Plan Note (Signed)
Well controlled, no changes to meds. Encouraged heart healthy diet such as the DASH diet and exercise as tolerated.  °

## 2017-07-09 NOTE — Assessment & Plan Note (Signed)
hgba1c acceptable, minimize simple carbs. Increase exercise as tolerated.  

## 2017-07-09 NOTE — Assessment & Plan Note (Signed)
Following with neurology, no recent changes

## 2017-07-09 NOTE — Progress Notes (Signed)
Patient ID: Kristin [name], female   DOB: September 14, 1950, 66 y.o.   MRN: [id number]   Subjective:    Patient ID: Kristin [name], female    DOB: [birth year], 66 y.o.   MRN: [id number]  Chief Complaint  Patient presents with  . Follow-up    HPI Patient is in today for follow up. She is very frustrated with her persistent weight troubles . She tried the bariatric referral and it helped motivation but she did not loose. Weight. She is enjoying her Parkinson's support group. No recent febrile illness or hospitalizations. No polyuria or polydipsia. Denies CP/palp/SOB/HA/congestion/fevers/GI or GU c/o. Taking meds as prescribed  Past Medical History:  Diagnosis Date  . Abnormal vaginal Pap smear   . Acute pharyngitis 02/25/2013  . Anemia   . Anxiety   . Arthritis   . 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 Chicken pox as a child  . Chronic UTI    sees dr Terance Hart  . Constipation 12/07/2015  . Depression with anxiety 11/02/2009   Qualifier: Diagnosis of  By: [name], LPN, [name]   . Dermatitis 07/17/2012  . Esophageal stricture 1994  . Fibroids   . Foot pain, bilateral 06/19/2012  . GERD (gastroesophageal reflux disease)   . GERD (gastroesophageal reflux disease)   . Hiatal hernia   . Hyperglycemia 08/19/2013  . 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 Hyperlipidemia   . Hypertension   . Infertility, female   . Measles as a child  . Obesity   . Osteoarthritis   . Parkinson disease ([place])   . Plantar fasciitis of left foot 06/19/2012  . Preventative health care 12/19/2015  . Rosacea 10/05/2014  . Swallowing difficulty   . Urinary frequency 02/25/2013  . Visual floaters 05/11/2014    Past Surgical History:  Procedure Laterality Date  . ABDOMINAL HYSTERECTOMY  2006   total  . esophageal     stretching  . laporoscopy    . PARTIAL HIP ARTHROPLASTY  2010   right  . TONSILLECTOMY    . wisdom teeth extracted      Family History  Problem Relation Age of Onset  . Cancer  Mother        breast  . Other Mother        arrythmia  . Mental illness Mother        bipolar  . Hyperlipidemia Mother   . Thyroid disease Mother   . Depression Mother   . Bipolar disorder Mother   . Heart disease Father   . Arthritis Father        rheumatoid  . Hypertension Father   . Hyperlipidemia Father   . Depression Sister   . Mental illness Sister        bipolar  . Parkinson's disease Sister   . Arthritis Sister   . Colon cancer Neg Hx   . Esophageal cancer Neg Hx   . Rectal cancer Neg Hx   . Stomach cancer Neg Hx     Social History   Socioeconomic History  . Marital status: Married    Spouse name: Not on file  . Number of children: Not on file  . Years of education: Not on file  . Highest education level: Not on file  Social Needs  . Financial resource strain: Not on file  . Food insecurity - worry: Not on file  . Food insecurity - inability: Not on file  . Transportation needs - medical: Not on  file  . Transportation needs - non-medical: Not on file  Occupational History  . Occupation: Retired Teacher, music  Tobacco Use  . Smoking status: Never Smoker  . Smokeless tobacco: Never Used  Substance and Sexual Activity  . Alcohol use: Yes    Alcohol/week: 2.4 oz    Types: 4 Glasses of wine per week    Comment: 4 glasses of wine weekly  . Drug use: No  . Sexual activity: Yes    Partners: Male  Other Topics Concern  . Not on file  Social History Narrative   Lives with husband, no major dietary restrictions, retired from teaching      Husband dx with cancer MDS June 2017.   Currently in remission. (11/03/16 pc)       Outpatient Medications Prior to Visit  Medication Sig Dispense Refill  . ALPRAZolam (XANAX) 0.25 MG tablet Take 0.5-1 tablets (0.125-0.25 mg total) by mouth 2 (two) times daily as needed for anxiety. 30 tablet 1  . carbidopa-levodopa (SINEMET CR) 50-200 MG tablet Take 1 tablet by mouth at bedtime. 90 tablet 1  . carbidopa-levodopa  (SINEMET IR) 25-100 MG tablet TAKE 1 TABLET BY MOUTH 3 TIMES DAILY 90 tablet 5  . Cholecalciferol (VITAMIN D) 2000 units CAPS Take by mouth.    . DULoxetine (CYMBALTA) 30 MG capsule take 1 capsule by mouth once daily 90 capsule 1  . escitalopram (LEXAPRO) 20 MG tablet take 1 tablet by mouth once daily 90 tablet 2  . Ferrous Sulfate (IRON) 325 (65 Fe) MG TABS Take 1 tablet (325 mg total) by mouth daily. (Patient not taking: Reported on 06/19/2017) 30 each 0  . omeprazole (PRILOSEC) 20 MG capsule TAKE 1 CAPSULE BY MOUTH ONCE DAILY 90 capsule 0  . polyethylene glycol powder (GLYCOLAX/MIRALAX) powder Take 17 g by mouth 2 (two) times daily as needed (1 to 2 times per day, as needed). 850 g 0  . pramipexole (MIRAPEX) 0.5 MG tablet TAKE 2 TABLETS BY MOUTH IN THE MORNING AND 1 TABLET IN THE AFTERNOON AND 1 TABLET IN THE EVENING 360 tablet 0  . rosuvastatin (CRESTOR) 10 MG tablet TAKE 1 TABLET BY MOUTH ON MONDAYS, WEDNESDAYS, AND FRIDAYS 90 tablet 0  . telmisartan (MICARDIS) 80 MG tablet Take 1 tablet (80 mg total) by mouth daily. 90 tablet 1  . Vitamin D, Ergocalciferol, (DRISDOL) 50000 units CAPS capsule TAKE 1 CAPSULE EVERY 7 DAYS 4 capsule 0   No facility-administered medications prior to visit.     Allergies  Allergen Reactions  . Bactrim [Sulfamethoxazole-Trimethoprim] Other (See Comments)    Oral ulcers and rash  . Penicillins Hives    Review of Systems  Constitutional: Positive for malaise/fatigue. Negative for fever.  HENT: Negative for congestion.   Eyes: Negative for blurred vision.  Respiratory: Negative for shortness of breath.   Cardiovascular: Negative for chest pain, palpitations and leg swelling.  Gastrointestinal: Negative for abdominal pain, blood in stool and nausea.  Genitourinary: Negative for dysuria and frequency.  Musculoskeletal: Negative for falls.  Skin: Negative for rash.  Neurological: Negative for dizziness, loss of consciousness and headaches.    Endo/Heme/Allergies: Negative for environmental allergies.  Psychiatric/Behavioral: Negative for depression. The patient is not nervous/anxious.   All other systems reviewed and are negative.      Objective:    Physical Exam  Constitutional: She is oriented to person, place, and time. She appears well-developed and well-nourished. No distress.  HENT:  Head: Normocephalic and atraumatic.  Nose: Nose normal.  Eyes: Right eye exhibits no discharge. Left eye exhibits no discharge.  Neck: Normal range of motion. Neck supple.  Cardiovascular: Normal rate and regular rhythm.  No murmur heard. Pulmonary/Chest: Effort normal and breath sounds normal.  Abdominal: Soft. Bowel sounds are normal. There is no tenderness.  Musculoskeletal: She exhibits no edema.  Neurological: She is alert and oriented to person, place, and time.  Skin: Skin is warm and dry.  Psychiatric: She has a normal mood and affect.  Nursing note and vitals reviewed.   BP 130/80 (BP Location: Right Arm, Patient Position: Sitting, Cuff Size: Large)   Pulse 80   Temp 98.1 F (36.7 C) (Oral)   Resp 18   Wt 245 lb (111.1 kg)   SpO2 96%   BMI 42.05 kg/m  Wt Readings from Last 3 Encounters:  07/09/17 245 lb (111.1 kg)  06/19/17 245 lb (111.1 kg)  05/14/17 238 lb (108 kg)     Lab Results  Component Value Date   WBC 6.1 05/10/2017   HGB 11.8 (L) 05/10/2017   HCT 37.2 05/10/2017   PLT 228.0 05/10/2017   GLUCOSE 100 (H) 05/10/2017   CHOL 157 05/10/2017   TRIG 68.0 05/10/2017   HDL 64.40 05/10/2017   LDLDIRECT 161.5 10/07/2008   LDLCALC 79 05/10/2017   ALT 6 05/10/2017   AST 16 05/10/2017   NA 141 05/10/2017   K 4.1 05/10/2017   CL 106 05/10/2017   CREATININE 0.80 05/10/2017   BUN 21 05/10/2017   CO2 29 05/10/2017   TSH 0.98 05/10/2017   INR 2.5 (H) 01/19/2009   HGBA1C 6.2 05/10/2017    Lab Results  Component Value Date   TSH 0.98 05/10/2017   Lab Results  Component Value Date   WBC 6.1  05/10/2017   HGB 11.8 (L) 05/10/2017   HCT 37.2 05/10/2017   MCV 82.6 05/10/2017   PLT 228.0 05/10/2017   Lab Results  Component Value Date   NA 141 05/10/2017   K 4.1 05/10/2017   CO2 29 05/10/2017   GLUCOSE 100 (H) 05/10/2017   BUN 21 05/10/2017   CREATININE 0.80 05/10/2017   BILITOT 0.4 05/10/2017   ALKPHOS 80 05/10/2017   AST 16 05/10/2017   ALT 6 05/10/2017   PROT 6.9 05/10/2017   ALBUMIN 3.9 05/10/2017   CALCIUM 9.4 05/10/2017   GFR 76.04 05/10/2017   Lab Results  Component Value Date   CHOL 157 05/10/2017   Lab Results  Component Value Date   HDL 64.40 05/10/2017   Lab Results  Component Value Date   LDLCALC 79 05/10/2017   Lab Results  Component Value Date   TRIG 68.0 05/10/2017   Lab Results  Component Value Date   CHOLHDL 2 05/10/2017   Lab Results  Component Value Date   HGBA1C 6.2 05/10/2017       Assessment & Plan:   Problem List Items Addressed This Visit    Hyperlipidemia    Encouraged heart healthy diet, increase exercise, avoid trans fats, consider a krill oil cap daily      Obesity    hgba1c acceptable, minimize simple carbs. Increase exercise as tolerated.       Essential hypertension    Well controlled, no changes to meds. Encouraged heart healthy diet such as the DASH diet and exercise as tolerated.       Parkinson's disease [facility])    Following with neurology, no recent changes      Vitamin D deficiency    WNL  on recent check. Continue daily supplement         I am having Kristin [name] maintain her escitalopram, DULoxetine, polyethylene glycol powder, rosuvastatin, carbidopa-levodopa, ALPRAZolam, telmisartan, pramipexole, Iron, Vitamin D, omeprazole, carbidopa-levodopa, and Vitamin D (Ergocalciferol).  No orders of the defined types were placed in this encounter.   CMA served as Education administrator during this visit. History, Physical and Plan performed by medical provider. Documentation and orders reviewed and attested to.    [name], MD

## 2017-07-11 ENCOUNTER — Encounter (INDEPENDENT_AMBULATORY_CARE_PROVIDER_SITE_OTHER): Payer: Self-pay | Admitting: Physician Assistant

## 2017-07-12 DIAGNOSIS — F331 Major depressive disorder, recurrent, moderate: Secondary | ICD-10-CM | POA: Diagnosis not present

## 2017-07-15 ENCOUNTER — Other Ambulatory Visit: Payer: Self-pay | Admitting: Family Medicine

## 2017-07-15 DIAGNOSIS — K219 Gastro-esophageal reflux disease without esophagitis: Secondary | ICD-10-CM

## 2017-07-17 ENCOUNTER — Ambulatory Visit (INDEPENDENT_AMBULATORY_CARE_PROVIDER_SITE_OTHER): Payer: Medicare Other | Admitting: Physician Assistant

## 2017-07-17 VITALS — BP 110/72 | HR 73 | Temp 98.3°F | Ht 64.0 in | Wt 241.0 lb

## 2017-07-17 DIAGNOSIS — Z6841 Body Mass Index (BMI) 40.0 and over, adult: Secondary | ICD-10-CM

## 2017-07-17 DIAGNOSIS — E559 Vitamin D deficiency, unspecified: Secondary | ICD-10-CM | POA: Diagnosis not present

## 2017-07-17 NOTE — Progress Notes (Signed)
Office: [phone]  /  Fax: [phone]   HPI:   Chief Complaint: OBESITY Kristin [name] is here to discuss her progress with her obesity treatment plan. She is on the Category 2 plan and is following her eating plan approximately 0 % of the time. She states she is doing spin class, yoga, and boxing for 60 minutes 4 times per week. Kristin [name]'s last visit was 6 weeks ago. She has been busier taking care of her sick husband and has not been following the meal plan. She is motivated to get back on track and continue weight loss.  Her weight is 241 lb (109.3 kg) today and has gained 3 pounds since her last visit. She has lost 3 lbs since starting treatment with [place].  Vitamin D deficiency Kristin [name] has a diagnosis of vitamin D deficiency. She is currently taking prescription Vit D and denies nausea, vomiting or muscle weakness.  ALLERGIES: Allergies  Allergen Reactions  . Bactrim [Sulfamethoxazole-Trimethoprim] Other (See Comments)    Oral ulcers and rash  . Penicillins Hives    MEDICATIONS: Current Outpatient Medications on File Prior to Visit  Medication Sig Dispense Refill  . ALPRAZolam (XANAX) 0.25 MG tablet Take 0.5-1 tablets (0.125-0.25 mg total) by mouth 2 (two) times daily as needed for anxiety. 30 tablet 1  . carbidopa-levodopa (SINEMET CR) 50-200 MG tablet Take 1 tablet by mouth at bedtime. 90 tablet 1  . carbidopa-levodopa (SINEMET IR) 25-100 MG tablet TAKE 1 TABLET BY MOUTH 3 TIMES DAILY 90 tablet 5  . Cholecalciferol (VITAMIN D) 2000 units CAPS Take by mouth.    . DULoxetine (CYMBALTA) 30 MG capsule TAKE 1 CAPSULE BY MOUTH ONCE DAILY 90 capsule 0  . escitalopram (LEXAPRO) 20 MG tablet Take 1 tablet (20 mg total) daily by mouth. 90 tablet 2  . Ferrous Sulfate (IRON) 325 (65 Fe) MG TABS Take 1 tablet (325 mg total) by mouth daily. 30 each 0  . omeprazole (PRILOSEC) 20 MG capsule TAKE 1 CAPSULE BY MOUTH ONCE DAILY 90 capsule 0  . polyethylene glycol powder (GLYCOLAX/MIRALAX) powder Take 17 g  by mouth 2 (two) times daily as needed (1 to 2 times per day, as needed). 850 g 0  . pramipexole (MIRAPEX) 0.5 MG tablet TAKE 2 TABLETS BY MOUTH IN THE MORNING AND 1 TABLET IN THE AFTERNOON AND 1 TABLET IN THE EVENING 360 tablet 0  . rosuvastatin (CRESTOR) 10 MG tablet TAKE 1 TABLET BY MOUTH ON MONDAYS, WEDNESDAYS, AND FRIDAYS 90 tablet 0  . telmisartan (MICARDIS) 80 MG tablet Take 1 tablet (80 mg total) by mouth daily. 90 tablet 1  . Vitamin D, Ergocalciferol, (DRISDOL) 50000 units CAPS capsule TAKE 1 CAPSULE EVERY 7 DAYS 4 capsule 0   No current facility-administered medications on file prior to visit.     PAST MEDICAL HISTORY: Past Medical History:  Diagnosis Date  . Abnormal vaginal Pap smear   . Acute pharyngitis 02/25/2013  . Anemia   . Anxiety   . Arthritis   . 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 Chicken pox as a child  . Chronic UTI    sees dr Terance Hart  . Constipation 12/07/2015  . Depression with anxiety 11/02/2009   Qualifier: Diagnosis of  By: [name], LPN, [name]   . Dermatitis 07/17/2012  . Esophageal stricture 1994  . Fibroids   . Foot pain, bilateral 06/19/2012  . GERD (gastroesophageal reflux disease)   . GERD (gastroesophageal reflux disease)   .  Hiatal hernia   . Hyperglycemia 08/19/2013  . 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 Hyperlipidemia   . Hypertension   . Infertility, female   . Measles as a child  . Obesity   . Osteoarthritis   . Parkinson disease ([place])   . Plantar fasciitis of left foot 06/19/2012  . Preventative health care 12/19/2015  . Rosacea 10/05/2014  . Swallowing difficulty   . Urinary frequency 02/25/2013  . Visual floaters 05/11/2014    PAST SURGICAL HISTORY: Past Surgical History:  Procedure Laterality Date  . ABDOMINAL HYSTERECTOMY  2006   total  . esophageal     stretching  . laporoscopy    . PARTIAL HIP ARTHROPLASTY  2010   right  . TONSILLECTOMY    . wisdom teeth extracted      SOCIAL HISTORY: Social  History   Tobacco Use  . Smoking status: Never Smoker  . Smokeless tobacco: Never Used  Substance Use Topics  . Alcohol use: Yes    Alcohol/week: 2.4 oz    Types: 4 Glasses of wine per week    Comment: 4 glasses of wine weekly  . Drug use: No    FAMILY HISTORY: Family History  Problem Relation Age of Onset  . Cancer Mother        breast  . Other Mother        arrythmia  . Mental illness Mother        bipolar  . Hyperlipidemia Mother   . Thyroid disease Mother   . Depression Mother   . Bipolar disorder Mother   . Heart disease Father   . Arthritis Father        rheumatoid  . Hypertension Father   . Hyperlipidemia Father   . Depression Sister   . Mental illness Sister        bipolar  . Parkinson's disease Sister   . Arthritis Sister   . Colon cancer Neg Hx   . Esophageal cancer Neg Hx   . Rectal cancer Neg Hx   . Stomach cancer Neg Hx     ROS: Review of Systems  Constitutional: Negative for weight loss.  Gastrointestinal: Negative for nausea and vomiting.  Musculoskeletal:       Negative muscle weakness    PHYSICAL EXAM: Blood pressure 110/72, pulse 73, temperature 98.3 F (36.8 C), temperature source Oral, height 5\' 4"  (1.626 m), weight 241 lb (109.3 kg), SpO2 96 %. Body mass index is 41.37 kg/m. Physical Exam  Constitutional: She is oriented to person, place, and time. She appears well-developed and well-nourished.  Cardiovascular: Normal rate.  Pulmonary/Chest: Effort normal.  Musculoskeletal: Normal range of motion.  Neurological: She is oriented to person, place, and time.  Skin: Skin is warm and dry.  Psychiatric: She has a normal mood and affect. Her behavior is normal.  Vitals reviewed.   RECENT LABS AND TESTS: BMET    Component Value Date/Time   NA 141 05/10/2017 0910   NA 141 11/27/2016 1123   K 4.1 05/10/2017 0910   CL 106 05/10/2017 0910   CO2 29 05/10/2017 0910   GLUCOSE 100 (H) 05/10/2017 0910   BUN 21 05/10/2017 0910   BUN 13  11/27/2016 1123   CREATININE 0.80 05/10/2017 0910   CREATININE 0.71 01/04/2015 1046   CALCIUM 9.4 05/10/2017 0910   GFRNONAA 83 11/27/2016 1123   GFRNONAA >89 01/04/2015 1046   GFRAA 96 11/27/2016 1123   GFRAA >89 01/04/2015 1046   Lab Results  Component Value Date  HGBA1C 6.2 05/10/2017   HGBA1C 6.1 (H) 11/27/2016   HGBA1C 6.2 11/03/2016   HGBA1C 6.0 05/04/2016   HGBA1C 6.1 12/07/2015   Lab Results  Component Value Date   INSULIN 18.0 11/27/2016   CBC    Component Value Date/Time   WBC 6.1 05/10/2017 0910   RBC 4.51 05/10/2017 0910   HGB 11.8 (L) 05/10/2017 0910   HGB 12.2 11/27/2016 1123   HCT 37.2 05/10/2017 0910   HCT 38.3 11/27/2016 1123   PLT 228.0 05/10/2017 0910   MCV 82.6 05/10/2017 0910   MCV 89 11/27/2016 1123   MCH 28.3 11/27/2016 1123   MCH 31.6 04/07/2013 0923   MCHC 31.8 05/10/2017 0910   RDW 17.0 (H) 05/10/2017 0910   RDW 15.3 11/27/2016 1123   LYMPHSABS 1.8 11/27/2016 1123   EOSABS 0.1 11/27/2016 1123   BASOSABS 0.0 11/27/2016 1123   Iron/TIBC/Ferritin/ %Sat    Component Value Date/Time   IRON 31 03/05/2017 1224   TIBC 402 03/05/2017 1224   FERRITIN 12 (L) 03/05/2017 1224   IRONPCTSAT 8 (LL) 03/05/2017 1224   Lipid Panel     Component Value Date/Time   CHOL 157 05/10/2017 0910   CHOL 180 11/27/2016 1123   TRIG 68.0 05/10/2017 0910   HDL 64.40 05/10/2017 0910   HDL 57 11/27/2016 1123   CHOLHDL 2 05/10/2017 0910   VLDL 13.6 05/10/2017 0910   LDLCALC 79 05/10/2017 0910   LDLCALC 88 11/27/2016 1123   LDLDIRECT 161.5 10/07/2008 0944   Hepatic Function Panel     Component Value Date/Time   PROT 6.9 05/10/2017 0910   PROT 6.9 11/27/2016 1123   ALBUMIN 3.9 05/10/2017 0910   ALBUMIN 4.3 11/27/2016 1123   AST 16 05/10/2017 0910   ALT 6 05/10/2017 0910   ALKPHOS 80 05/10/2017 0910   BILITOT 0.4 05/10/2017 0910   BILITOT 0.4 11/27/2016 1123   BILIDIR 0.1 05/05/2014 0948   IBILI 0.4 04/07/2013 0923      Component Value Date/Time    TSH 0.98 05/10/2017 0910   TSH 1.480 11/27/2016 1123   TSH 0.87 11/03/2016 1030    ASSESSMENT AND PLAN: Vitamin D deficiency  Class 3 severe obesity with serious comorbidity and body mass index (BMI) of 40.0 to 44.9 in adult, unspecified obesity type (HCC)  PLAN:  Vitamin D Deficiency Kristin [name] was informed that low vitamin D levels contributes to fatigue and are associated with obesity, breast, and colon cancer. Kristin [name] agrees to continue taking prescription Vit D @50 ,000 IU every week #4 and she will follow up for routine testing of vitamin D, at least 2-3 times per year. She was informed of the risk of over-replacement of vitamin D and agrees to not increase her dose unless he discusses this with [place] first. Kristin [name] agrees to follow up with our clinic in 2 weeks.  We spent > than 50% of the 15 minute visit on the counseling as documented in the note.  Obesity Kristin [name] is currently in the action stage of change. As such, her goal is to continue with weight loss efforts She has agreed to change to keep a food journal with 1200 calories and 90 grams of protein daily Kristin [name] has been instructed to work up to a goal of 150 minutes of combined cardio and strengthening exercise per week for weight loss and overall health benefits. We discussed the following Behavioral Modification Strategies today: increasing lean protein intake and keep a strict food journal   Kristin [name] has agreed to follow up  with our clinic in 2 weeks. She was informed of the importance of frequent follow up visits to maximize her success with intensive lifestyle modifications for her multiple health conditions.  Kristin [name], Kristin [name], am acting as transcriptionist for Lacy Duverney, PA-C  Kristin [name] have reviewed the above documentation for accuracy and completeness, and Kristin [name] agree with the above. -Lacy Duverney, PA-C  Kristin [name] have reviewed the above note and agree with the plan. -Dennard Nip, MD      Today's visit was # 11 out of 22.  Starting weight: 244  lbs Starting date: 11/27/16 Today's weight : 241 lbs  Today's date: 07/17/2017 Total lbs lost to date: 3 (Patients must lose 7 lbs in the first 6 months to continue with counseling)   ASK: We discussed the diagnosis of obesity with Kyra Searles today and Stanton Kidney agreed to give [place] permission to discuss obesity behavioral modification therapy today.  ASSESS: Deshunda has the diagnosis of obesity and her BMI today is 41.35 Chaya is in the action stage of change   ADVISE: Derra was educated on the multiple health risks of obesity as well as the benefit of weight loss to improve her health. She was advised of the need for long term treatment and the importance of lifestyle modifications.  AGREE: Multiple dietary modification options and treatment options were discussed and  Ashni agreed to keep a food journal with 1200 calories and 90 grams of protein daily We discussed the following Behavioral Modification Strategies today: increasing lean protein intake and keep a strict food journal

## 2017-07-19 DIAGNOSIS — F331 Major depressive disorder, recurrent, moderate: Secondary | ICD-10-CM | POA: Diagnosis not present

## 2017-07-22 ENCOUNTER — Encounter: Payer: Self-pay | Admitting: Family Medicine

## 2017-07-23 ENCOUNTER — Other Ambulatory Visit: Payer: Self-pay | Admitting: Family Medicine

## 2017-07-23 DIAGNOSIS — J069 Acute upper respiratory infection, unspecified: Secondary | ICD-10-CM | POA: Diagnosis not present

## 2017-07-23 MED ORDER — DOXYCYCLINE HYCLATE 100 MG PO TABS
100.0000 mg | ORAL_TABLET | Freq: Two times a day (BID) | ORAL | 0 refills | Status: DC
Start: 2017-07-23 — End: 2017-10-15

## 2017-07-24 ENCOUNTER — Other Ambulatory Visit: Payer: Self-pay

## 2017-07-24 DIAGNOSIS — E559 Vitamin D deficiency, unspecified: Secondary | ICD-10-CM

## 2017-07-24 MED ORDER — VITAMIN D (ERGOCALCIFEROL) 1.25 MG (50000 UNIT) PO CAPS: ORAL_CAPSULE | ORAL | 0 refills | Status: DC

## 2017-07-25 ENCOUNTER — Other Ambulatory Visit: Payer: Self-pay

## 2017-07-27 ENCOUNTER — Ambulatory Visit: Payer: Self-pay | Admitting: *Deleted

## 2017-07-27 NOTE — Telephone Encounter (Addendum)
Pt called regarding hoarseness she has had since last Saturday. She was seen at the [facility] and was checked by a PA there.  She has been taking the meds that the PA prescribed for her, cough med and nasal spray. She is concerned that the hoarseness is still there. She is afebrile, and no shortness of breathe. Care advice given to patient with verbal understanding  Disregard the cough protocol with answers here: Reason for Disposition . Mild hoarseness  Answer Assessment - Initial Assessment Questions 1. ONSET: "When did the cough begin?"      Last Saturday 2. SEVERITY: "How bad is the cough today?"      Severe at times 3. RESPIRATORY DISTRESS: "Describe your breathing."      normal 4. FEVER: "Do you have a fever?" If so, ask: "What is your temperature, how was it measured, and when did it start?"     No fever 5. HEMOPTYSIS: "Are you coughing up any blood?" If so ask: "How much?" (flecks, streaks, tablespoons, etc.)     n/a 6. TREATMENT: "What have you done so far to treat the cough?" (e.g., meds, fluids, humidifier)     Cough medicine, nasal spray, fluids 7. CARDIAC HISTORY: "Do you have any history of heart disease?" (e.g., heart attack, congestive heart failure)      no 8. LUNG HISTORY: "Do you have any history of lung disease?"  (e.g., pulmonary embolus, asthma, emphysema)     no 9. PE RISK FACTORS: "Do you have a history of blood clots?" (or: recent major surgery, recent prolonged travel, bedridden )     no 10. OTHER SYMPTOMS: "Do you have any other symptoms? (e.g., runny nose, wheezing, chest pain)       no 11. PREGNANCY: "Is there any chance you are pregnant?" "When was your last menstrual period?"       no 12. TRAVEL: "Have you traveled out of the country in the last month?" (e.g., travel history, exposures)       no  Answer Assessment - Initial Assessment Questions 1. DESCRIPTION: "Describe your voice."     raspy 2. ONSET: "When did the hoarseness begin?"   Saturday 3. COUGH: "Is there a cough?" If so, ask: "How bad?"     Yes, bad at times 4. FEVER: "Do you have a fever?" If so, ask: "What is your temperature, how was it measured, and when did it start?"     no 5. RESPIRATORY STATUS: "Describe your breathing."      normal 6. ALLERGIES: "Any allergy symptoms?" If so, ask: "What are they?"     no 7. IRRITANTS: "Do you smoke?" "Have you been exposed to any irritating fumes?"     no 8. CAUSE: "What do you think is causing the hoarseness?"     Not sure 9. OTHER SYMPTOMS: "Do you have any other symptoms?" (e.g., sore throat, swelling, foreign body, rash)     none 10. PREGNANCY: "Is there any chance you are pregnant?" "When was your last menstrual period?"       no  Protocols used: HOARSENESS-A-AH, COUGH - ACUTE NON-PRODUCTIVE-A-AH

## 2017-07-31 ENCOUNTER — Ambulatory Visit (INDEPENDENT_AMBULATORY_CARE_PROVIDER_SITE_OTHER): Payer: Medicare Other | Admitting: Physician Assistant

## 2017-07-31 VITALS — BP 113/75 | HR 81 | Temp 98.5°F | Ht 64.0 in | Wt 242.0 lb

## 2017-07-31 DIAGNOSIS — E559 Vitamin D deficiency, unspecified: Secondary | ICD-10-CM | POA: Diagnosis not present

## 2017-07-31 DIAGNOSIS — Z6841 Body Mass Index (BMI) 40.0 and over, adult: Secondary | ICD-10-CM | POA: Diagnosis not present

## 2017-07-31 NOTE — Progress Notes (Signed)
Office: [phone]  /  Fax: [phone]   HPI:   Chief Complaint: OBESITY Kristin [name] is here to discuss her progress with her obesity treatment plan. She is on the keep a food journal with 1200 calories and 90 grams of protein daily and is following her eating plan approximately 30 % of the time. She states she is exercising 0 minutes 0 times per week. Kristin [name] has been sick with Laryngitis and has not been following the plan. She would like more meal planning ideas.  Her weight is 242 lb (109.8 kg) today and has gained 1 pound since her last visit. She has lost 2 lbs since starting treatment with [place].  Vitamin D deficiency Kristin [name] has a diagnosis of vitamin D deficiency. She is currently taking prescription Vit D and denies nausea, vomiting or muscle weakness.  ALLERGIES: Allergies  Allergen Reactions  . Bactrim [Sulfamethoxazole-Trimethoprim] Other (See Comments)    Oral ulcers and rash  . Penicillins Hives    MEDICATIONS: Current Outpatient Medications on File Prior to Visit  Medication Sig Dispense Refill  . ALPRAZolam (XANAX) 0.25 MG tablet Take 0.5-1 tablets (0.125-0.25 mg total) by mouth 2 (two) times daily as needed for anxiety. 30 tablet 1  . carbidopa-levodopa (SINEMET CR) 50-200 MG tablet Take 1 tablet by mouth at bedtime. 90 tablet 1  . carbidopa-levodopa (SINEMET IR) 25-100 MG tablet TAKE 1 TABLET BY MOUTH 3 TIMES DAILY 90 tablet 5  . Cholecalciferol (VITAMIN D) 2000 units CAPS Take by mouth.    . doxycycline (VIBRA-TABS) 100 MG tablet Take 1 tablet (100 mg total) 2 (two) times daily by mouth. 20 tablet 0  . DULoxetine (CYMBALTA) 30 MG capsule TAKE 1 CAPSULE BY MOUTH ONCE DAILY 90 capsule 0  . escitalopram (LEXAPRO) 20 MG tablet Take 1 tablet (20 mg total) daily by mouth. 90 tablet 2  . Ferrous Sulfate (IRON) 325 (65 Fe) MG TABS Take 1 tablet (325 mg total) by mouth daily. 30 each 0  . omeprazole (PRILOSEC) 20 MG capsule TAKE 1 CAPSULE BY MOUTH ONCE DAILY 90 capsule 0  .  polyethylene glycol powder (GLYCOLAX/MIRALAX) powder Take 17 g by mouth 2 (two) times daily as needed (1 to 2 times per day, as needed). 850 g 0  . pramipexole (MIRAPEX) 0.5 MG tablet TAKE 2 TABLETS BY MOUTH IN THE MORNING AND 1 TABLET IN THE AFTERNOON AND 1 TABLET IN THE EVENING 360 tablet 0  . rosuvastatin (CRESTOR) 10 MG tablet TAKE 1 TABLET BY MOUTH ON MONDAYS, WEDNESDAYS, AND FRIDAYS 90 tablet 0  . telmisartan (MICARDIS) 80 MG tablet Take 1 tablet (80 mg total) by mouth daily. 90 tablet 1  . Vitamin D, Ergocalciferol, (DRISDOL) 50000 units CAPS capsule TAKE 1 CAPSULE EVERY 7 DAYS 4 capsule 0   No current facility-administered medications on file prior to visit.     PAST MEDICAL HISTORY: Past Medical History:  Diagnosis Date  . Abnormal vaginal Pap smear   . Acute pharyngitis 02/25/2013  . Anemia   . Anxiety   . Arthritis   . 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 Chicken pox as a child  . Chronic UTI    sees dr Terance Hart  . Constipation 12/07/2015  . Depression with anxiety 11/02/2009   Qualifier: Diagnosis of  By: [name], LPN, [name]   . Dermatitis 07/17/2012  . Esophageal stricture 1994  . Fibroids   . Foot pain, bilateral 06/19/2012  . GERD (gastroesophageal reflux disease)   .  GERD (gastroesophageal reflux disease)   . Hiatal hernia   . Hyperglycemia 08/19/2013  . 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 Hyperlipidemia   . Hypertension   . Infertility, female   . Measles as a child  . Obesity   . Osteoarthritis   . Parkinson disease ([place])   . Plantar fasciitis of left foot 06/19/2012  . Preventative health care 12/19/2015  . Rosacea 10/05/2014  . Swallowing difficulty   . Urinary frequency 02/25/2013  . Visual floaters 05/11/2014    PAST SURGICAL HISTORY: Past Surgical History:  Procedure Laterality Date  . ABDOMINAL HYSTERECTOMY  2006   total  . esophageal     stretching  . laporoscopy    . PARTIAL HIP ARTHROPLASTY  2010   right  .  TONSILLECTOMY    . wisdom teeth extracted      SOCIAL HISTORY: Social History   Tobacco Use  . Smoking status: Never Smoker  . Smokeless tobacco: Never Used  Substance Use Topics  . Alcohol use: Yes    Alcohol/week: 2.4 oz    Types: 4 Glasses of wine per week    Comment: 4 glasses of wine weekly  . Drug use: No    FAMILY HISTORY: Family History  Problem Relation Age of Onset  . Cancer Mother        breast  . Other Mother        arrythmia  . Mental illness Mother        bipolar  . Hyperlipidemia Mother   . Thyroid disease Mother   . Depression Mother   . Bipolar disorder Mother   . Heart disease Father   . Arthritis Father        rheumatoid  . Hypertension Father   . Hyperlipidemia Father   . Depression Sister   . Mental illness Sister        bipolar  . Parkinson's disease Sister   . Arthritis Sister   . Colon cancer Neg Hx   . Esophageal cancer Neg Hx   . Rectal cancer Neg Hx   . Stomach cancer Neg Hx     ROS: Review of Systems  Constitutional: Negative for weight loss.  Gastrointestinal: Negative for nausea and vomiting.  Musculoskeletal:       Negative muscle weakness    PHYSICAL EXAM: Blood pressure 113/75, pulse 81, temperature 98.5 F (36.9 C), temperature source Oral, height 5\' 4"  (1.626 m), weight 242 lb (109.8 kg), SpO2 98 %. Body mass index is 41.54 kg/m. Physical Exam  Constitutional: She is oriented to person, place, and time. She appears well-developed and well-nourished.  Cardiovascular: Normal rate.  Pulmonary/Chest: Effort normal.  Musculoskeletal: Normal range of motion.  Neurological: She is oriented to person, place, and time.  Skin: Skin is warm and dry.  Psychiatric: She has a normal mood and affect. Her behavior is normal.  Vitals reviewed.   RECENT LABS AND TESTS: BMET    Component Value Date/Time   NA 141 05/10/2017 0910   NA 141 11/27/2016 1123   K 4.1 05/10/2017 0910   CL 106 05/10/2017 0910   CO2 29 05/10/2017  0910   GLUCOSE 100 (H) 05/10/2017 0910   BUN 21 05/10/2017 0910   BUN 13 11/27/2016 1123   CREATININE 0.80 05/10/2017 0910   CREATININE 0.71 01/04/2015 1046   CALCIUM 9.4 05/10/2017 0910   GFRNONAA 83 11/27/2016 1123   GFRNONAA >89 01/04/2015 1046   GFRAA 96 11/27/2016 1123   GFRAA >89 01/04/2015 1046  Lab Results  Component Value Date   HGBA1C 6.2 05/10/2017   HGBA1C 6.1 (H) 11/27/2016   HGBA1C 6.2 11/03/2016   HGBA1C 6.0 05/04/2016   HGBA1C 6.1 12/07/2015   Lab Results  Component Value Date   INSULIN 18.0 11/27/2016   CBC    Component Value Date/Time   WBC 6.1 05/10/2017 0910   RBC 4.51 05/10/2017 0910   HGB 11.8 (L) 05/10/2017 0910   HGB 12.2 11/27/2016 1123   HCT 37.2 05/10/2017 0910   HCT 38.3 11/27/2016 1123   PLT 228.0 05/10/2017 0910   MCV 82.6 05/10/2017 0910   MCV 89 11/27/2016 1123   MCH 28.3 11/27/2016 1123   MCH 31.6 04/07/2013 0923   MCHC 31.8 05/10/2017 0910   RDW 17.0 (H) 05/10/2017 0910   RDW 15.3 11/27/2016 1123   LYMPHSABS 1.8 11/27/2016 1123   EOSABS 0.1 11/27/2016 1123   BASOSABS 0.0 11/27/2016 1123   Iron/TIBC/Ferritin/ %Sat    Component Value Date/Time   IRON 31 03/05/2017 1224   TIBC 402 03/05/2017 1224   FERRITIN 12 (L) 03/05/2017 1224   IRONPCTSAT 8 (LL) 03/05/2017 1224   Lipid Panel     Component Value Date/Time   CHOL 157 05/10/2017 0910   CHOL 180 11/27/2016 1123   TRIG 68.0 05/10/2017 0910   HDL 64.40 05/10/2017 0910   HDL 57 11/27/2016 1123   CHOLHDL 2 05/10/2017 0910   VLDL 13.6 05/10/2017 0910   LDLCALC 79 05/10/2017 0910   LDLCALC 88 11/27/2016 1123   LDLDIRECT 161.5 10/07/2008 0944   Hepatic Function Panel     Component Value Date/Time   PROT 6.9 05/10/2017 0910   PROT 6.9 11/27/2016 1123   ALBUMIN 3.9 05/10/2017 0910   ALBUMIN 4.3 11/27/2016 1123   AST 16 05/10/2017 0910   ALT 6 05/10/2017 0910   ALKPHOS 80 05/10/2017 0910   BILITOT 0.4 05/10/2017 0910   BILITOT 0.4 11/27/2016 1123   BILIDIR 0.1  05/05/2014 0948   IBILI 0.4 04/07/2013 0923      Component Value Date/Time   TSH 0.98 05/10/2017 0910   TSH 1.480 11/27/2016 1123   TSH 0.87 11/03/2016 1030    ASSESSMENT AND PLAN: Vitamin D deficiency  Class 3 severe obesity with serious comorbidity and body mass index (BMI) of 40.0 to 44.9 in adult, unspecified obesity type (HCC)  PLAN:  Vitamin D Deficiency Kristin [name] was informed that low vitamin D levels contributes to fatigue and are associated with obesity, breast, and colon cancer. Kristin [name] agrees to continue taking prescription Vit D @50 ,000 IU every week #4 and will follow up for routine testing of vitamin D, at least 2-3 times per year. She was informed of the risk of over-replacement of vitamin D and agrees to not increase her dose unless he discusses this with [place] first. Kristin [name] agrees to follow up with our clinic in 2 weeks.  We spent > than 50% of the 15 minute visit on the counseling as documented in the note.  Obesity Kristin [name] is currently in the action stage of change. As such, her goal is to continue with weight loss efforts She has agreed to keep a food journal with 1200 calories and 90 grams of protein daily Kristin [name] has been instructed to work up to a goal of 150 minutes of combined cardio and strengthening exercise per week for weight loss and overall health benefits. We discussed the following Behavioral Modification Strategies today: increasing lean protein intake and planning for success   Kristin [name] has agreed  to follow up with our clinic in 2 weeks. She was informed of the importance of frequent follow up visits to maximize her success with intensive lifestyle modifications for her multiple health conditions.  I, Kristin [name], am acting as transcriptionist for Kristin Duverney, PA-C  I have reviewed the above documentation for accuracy and completeness, and I agree with the above. -Kristin Duverney, PA-C  I have reviewed the above note and agree with the plan. -Kristin Nip,  MD     Today's visit was # 12 out of 22.  Starting weight: 244 lbs Starting date: 11/27/16 Today's weight : 242 lbs  Today's date: 07/31/2017 Total lbs lost to date: 2 (Patients must lose 7 lbs in the first 6 months to continue with counseling)   ASK: We discussed the diagnosis of obesity with Kristin [name] today and Kristin [name] agreed to give [place] permission to discuss obesity behavioral modification therapy today.  ASSESS: Kristin [name] has the diagnosis of obesity and her BMI today is 41.52 Kristin [name] is in the action stage of change   ADVISE: Kristin [name] was educated on the multiple health risks of obesity as well as the benefit of weight loss to improve her health. She was advised of the need for long term treatment and the importance of lifestyle modifications.  AGREE: Multiple dietary modification options and treatment options were discussed and  Kristin [name] agreed to keep a food journal with 1200 calories and 90 grams of protein daily We discussed the following Behavioral Modification Strategies today: increasing lean protein intake and planning for success

## 2017-08-03 ENCOUNTER — Other Ambulatory Visit: Payer: Self-pay

## 2017-08-03 DIAGNOSIS — E559 Vitamin D deficiency, unspecified: Secondary | ICD-10-CM

## 2017-08-03 MED ORDER — VITAMIN D (ERGOCALCIFEROL) 1.25 MG (50000 UNIT) PO CAPS: ORAL_CAPSULE | ORAL | 0 refills | Status: DC

## 2017-08-09 ENCOUNTER — Encounter: Payer: Self-pay | Admitting: Neurology

## 2017-08-09 DIAGNOSIS — F331 Major depressive disorder, recurrent, moderate: Secondary | ICD-10-CM | POA: Diagnosis not present

## 2017-08-13 ENCOUNTER — Ambulatory Visit (INDEPENDENT_AMBULATORY_CARE_PROVIDER_SITE_OTHER): Payer: Medicare Other | Admitting: Physician Assistant

## 2017-08-16 ENCOUNTER — Ambulatory Visit (INDEPENDENT_AMBULATORY_CARE_PROVIDER_SITE_OTHER): Payer: Medicare Other | Admitting: Physician Assistant

## 2017-08-16 ENCOUNTER — Other Ambulatory Visit: Payer: Self-pay | Admitting: Family Medicine

## 2017-08-16 VITALS — BP 108/71 | HR 74 | Temp 98.1°F | Ht 64.0 in | Wt 241.0 lb

## 2017-08-16 DIAGNOSIS — E559 Vitamin D deficiency, unspecified: Secondary | ICD-10-CM

## 2017-08-16 DIAGNOSIS — Z6841 Body Mass Index (BMI) 40.0 and over, adult: Secondary | ICD-10-CM

## 2017-08-16 DIAGNOSIS — F331 Major depressive disorder, recurrent, moderate: Secondary | ICD-10-CM | POA: Diagnosis not present

## 2017-08-16 NOTE — Progress Notes (Signed)
Office: [phone]  /  Fax: [phone]   HPI:   Chief Complaint: OBESITY Kristin [name] is here to discuss her progress with her obesity treatment plan. She is on the keep a food journal with 1200 calories and 90 grams of protein daily and is following her eating plan approximately 50 % of the time. She states she is exercising 60 minutes 5 times per week. Kristin [name] continues to do well with weight loss. She is mindful of her eating and controls her portions. She would like more meal planning ideas.   Her weight is 241 lb (109.3 kg) today and has had a weight loss of 1 pound over a period of 2 weeks since her last visit. She has lost 3 lbs since starting treatment with [place].  Vitamin D deficiency Kristin [name] has a diagnosis of vitamin D deficiency. She is not yet at goal.  She is currently taking prescription Vit D and denies nausea, vomiting or muscle weakness.  ALLERGIES: Allergies  Allergen Reactions  . Bactrim [Sulfamethoxazole-Trimethoprim] Other (See Comments)    Oral ulcers and rash  . Penicillins Hives    MEDICATIONS: Current Outpatient Medications on File Prior to Visit  Medication Sig Dispense Refill  . ALPRAZolam (XANAX) 0.25 MG tablet Take 0.5-1 tablets (0.125-0.25 mg total) by mouth 2 (two) times daily as needed for anxiety. 30 tablet 1  . carbidopa-levodopa (SINEMET CR) 50-200 MG tablet Take 1 tablet by mouth at bedtime. 90 tablet 1  . carbidopa-levodopa (SINEMET IR) 25-100 MG tablet TAKE 1 TABLET BY MOUTH 3 TIMES DAILY 90 tablet 5  . Cholecalciferol (VITAMIN D) 2000 units CAPS Take by mouth.    . doxycycline (VIBRA-TABS) 100 MG tablet Take 1 tablet (100 mg total) 2 (two) times daily by mouth. 20 tablet 0  . DULoxetine (CYMBALTA) 30 MG capsule TAKE 1 CAPSULE BY MOUTH ONCE DAILY 90 capsule 0  . escitalopram (LEXAPRO) 20 MG tablet Take 1 tablet (20 mg total) daily by mouth. 90 tablet 2  . Ferrous Sulfate (IRON) 325 (65 Fe) MG TABS Take 1 tablet (325 mg total) by mouth daily. 30 each 0  .  omeprazole (PRILOSEC) 20 MG capsule TAKE 1 CAPSULE BY MOUTH ONCE DAILY 90 capsule 0  . polyethylene glycol powder (GLYCOLAX/MIRALAX) powder Take 17 g by mouth 2 (two) times daily as needed (1 to 2 times per day, as needed). 850 g 0  . pramipexole (MIRAPEX) 0.5 MG tablet TAKE 2 TABLETS BY MOUTH IN THE MORNING AND 1 TABLET IN THE AFTERNOON AND 1 TABLET IN THE EVENING 360 tablet 0  . rosuvastatin (CRESTOR) 10 MG tablet TAKE 1 TABLET BY MOUTH ON MONDAYS, WEDNESDAYS, AND FRIDAYS 90 tablet 0  . telmisartan (MICARDIS) 80 MG tablet Take 1 tablet (80 mg total) by mouth daily. 90 tablet 1  . Vitamin D, Ergocalciferol, (DRISDOL) 50000 units CAPS capsule TAKE 1 CAPSULE EVERY 7 DAYS 4 capsule 0   No current facility-administered medications on file prior to visit.     PAST MEDICAL HISTORY: Past Medical History:  Diagnosis Date  . Abnormal vaginal Pap smear   . Acute pharyngitis 02/25/2013  . Anemia   . Anxiety   . Arthritis   . 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 Chicken pox as a child  . Chronic UTI    sees dr Terance Hart  . Constipation 12/07/2015  . Depression with anxiety 11/02/2009   Qualifier: Diagnosis of  By: [name], LPN, [name]   . Dermatitis  07/17/2012  . Esophageal stricture 1994  . Fibroids   . Foot pain, bilateral 06/19/2012  . GERD (gastroesophageal reflux disease)   . GERD (gastroesophageal reflux disease)   . Hiatal hernia   . Hyperglycemia 08/19/2013  . 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 Hyperlipidemia   . Hypertension   . Infertility, female   . Measles as a child  . Obesity   . Osteoarthritis   . Parkinson disease ([place])   . Plantar fasciitis of left foot 06/19/2012  . Preventative health care 12/19/2015  . Rosacea 10/05/2014  . Swallowing difficulty   . Urinary frequency 02/25/2013  . Visual floaters 05/11/2014    PAST SURGICAL HISTORY: Past Surgical History:  Procedure Laterality Date  . ABDOMINAL HYSTERECTOMY  2006   total  . esophageal      stretching  . laporoscopy    . PARTIAL HIP ARTHROPLASTY  2010   right  . TONSILLECTOMY    . wisdom teeth extracted      SOCIAL HISTORY: Social History   Tobacco Use  . Smoking status: Never Smoker  . Smokeless tobacco: Never Used  Substance Use Topics  . Alcohol use: Yes    Alcohol/week: 2.4 oz    Types: 4 Glasses of wine per week    Comment: 4 glasses of wine weekly  . Drug use: No    FAMILY HISTORY: Family History  Problem Relation Age of Onset  . Cancer Mother        breast  . Other Mother        arrythmia  . Mental illness Mother        bipolar  . Hyperlipidemia Mother   . Thyroid disease Mother   . Depression Mother   . Bipolar disorder Mother   . Heart disease Father   . Arthritis Father        rheumatoid  . Hypertension Father   . Hyperlipidemia Father   . Depression Sister   . Mental illness Sister        bipolar  . Parkinson's disease Sister   . Arthritis Sister   . Colon cancer Neg Hx   . Esophageal cancer Neg Hx   . Rectal cancer Neg Hx   . Stomach cancer Neg Hx     ROS: Review of Systems  Constitutional: Positive for weight loss.  Gastrointestinal: Negative for nausea and vomiting.  Musculoskeletal:       Negative muscle weakness    PHYSICAL EXAM: Blood pressure 108/71, pulse 74, temperature 98.1 F (36.7 C), temperature source Oral, height 5\' 4"  (1.626 m), weight 241 lb (109.3 kg), SpO2 97 %. Body mass index is 41.37 kg/m. Physical Exam  Constitutional: She is oriented to person, place, and time. She appears well-developed and well-nourished.  Cardiovascular: Normal rate.  Pulmonary/Chest: Effort normal.  Musculoskeletal: Normal range of motion.  Neurological: She is oriented to person, place, and time.  Skin: Skin is warm and dry.  Psychiatric: She has a normal mood and affect. Her behavior is normal.  Vitals reviewed.   RECENT LABS AND TESTS: BMET    Component Value Date/Time   NA 141 05/10/2017 0910   NA 141 11/27/2016  1123   K 4.1 05/10/2017 0910   CL 106 05/10/2017 0910   CO2 29 05/10/2017 0910   GLUCOSE 100 (H) 05/10/2017 0910   BUN 21 05/10/2017 0910   BUN 13 11/27/2016 1123   CREATININE 0.80 05/10/2017 0910   CREATININE 0.71 01/04/2015 1046   CALCIUM 9.4 05/10/2017 0910  GFRNONAA 83 11/27/2016 1123   GFRNONAA >89 01/04/2015 1046   GFRAA 96 11/27/2016 1123   GFRAA >89 01/04/2015 1046   Lab Results  Component Value Date   HGBA1C 6.2 05/10/2017   HGBA1C 6.1 (H) 11/27/2016   HGBA1C 6.2 11/03/2016   HGBA1C 6.0 05/04/2016   HGBA1C 6.1 12/07/2015   Lab Results  Component Value Date   INSULIN 18.0 11/27/2016   CBC    Component Value Date/Time   WBC 6.1 05/10/2017 0910   RBC 4.51 05/10/2017 0910   HGB 11.8 (L) 05/10/2017 0910   HGB 12.2 11/27/2016 1123   HCT 37.2 05/10/2017 0910   HCT 38.3 11/27/2016 1123   PLT 228.0 05/10/2017 0910   MCV 82.6 05/10/2017 0910   MCV 89 11/27/2016 1123   MCH 28.3 11/27/2016 1123   MCH 31.6 04/07/2013 0923   MCHC 31.8 05/10/2017 0910   RDW 17.0 (H) 05/10/2017 0910   RDW 15.3 11/27/2016 1123   LYMPHSABS 1.8 11/27/2016 1123   EOSABS 0.1 11/27/2016 1123   BASOSABS 0.0 11/27/2016 1123   Iron/TIBC/Ferritin/ %Sat    Component Value Date/Time   IRON 31 03/05/2017 1224   TIBC 402 03/05/2017 1224   FERRITIN 12 (L) 03/05/2017 1224   IRONPCTSAT 8 (LL) 03/05/2017 1224   Lipid Panel     Component Value Date/Time   CHOL 157 05/10/2017 0910   CHOL 180 11/27/2016 1123   TRIG 68.0 05/10/2017 0910   HDL 64.40 05/10/2017 0910   HDL 57 11/27/2016 1123   CHOLHDL 2 05/10/2017 0910   VLDL 13.6 05/10/2017 0910   LDLCALC 79 05/10/2017 0910   LDLCALC 88 11/27/2016 1123   LDLDIRECT 161.5 10/07/2008 0944   Hepatic Function Panel     Component Value Date/Time   PROT 6.9 05/10/2017 0910   PROT 6.9 11/27/2016 1123   ALBUMIN 3.9 05/10/2017 0910   ALBUMIN 4.3 11/27/2016 1123   AST 16 05/10/2017 0910   ALT 6 05/10/2017 0910   ALKPHOS 80 05/10/2017 0910    BILITOT 0.4 05/10/2017 0910   BILITOT 0.4 11/27/2016 1123   BILIDIR 0.1 05/05/2014 0948   IBILI 0.4 04/07/2013 0923      Component Value Date/Time   TSH 0.98 05/10/2017 0910   TSH 1.480 11/27/2016 1123   TSH 0.87 11/03/2016 1030    ASSESSMENT AND PLAN: Vitamin D deficiency  Class 3 severe obesity with serious comorbidity and body mass index (BMI) of 40.0 to 44.9 in adult, unspecified obesity type (HCC)  PLAN:  Vitamin D Deficiency Orena was informed that low vitamin D levels contributes to fatigue and are associated with obesity, breast, and colon cancer. Merridy agrees to continue taking prescription Vit D @50 ,000 IU every week #4 and will follow up for routine testing of vitamin D, at least 2-3 times per year. She was informed of the risk of over-replacement of vitamin D and agrees to not increase her dose unless he discusses this with [place] first. Kariel agrees to follow up with our clinic in 4 weeks.  We spent > than 50% of the 15 minute visit on the counseling as documented in the note.  Obesity Kristin [name] is currently in the action stage of change. As such, her goal is to continue with weight loss efforts She has agreed to keep a food journal with 1200 calories and 90 grams of protein daily Kristin [name] has been instructed to work up to a goal of 150 minutes of combined cardio and strengthening exercise per week for weight loss and  overall health benefits. We discussed the following Behavioral Modification Strategies today: increasing lean protein intake and work on meal planning and easy cooking plans   Kristin [name] has agreed to follow up with our clinic in 4 weeks. She was informed of the importance of frequent follow up visits to maximize her success with intensive lifestyle modifications for her multiple health conditions.  I, Trixie Dredge, am acting as transcriptionist for Lacy Duverney, PA-C  I have reviewed the above documentation for accuracy and completeness, and I agree with the above. -Lacy Duverney, PA-C  I have reviewed the above note and agree with the plan. -Dennard Nip, MD     Today's visit was # 13 out of 22.  Starting weight: 244 lbs Starting date: 11/27/16 Today's weight : 241 lbs  Today's date: 08/16/2017 Total lbs lost to date: 3 (Patients must lose 7 lbs in the first 6 months to continue with counseling)   ASK: We discussed the diagnosis of obesity with Kristin [name] today and Kristin [name] agreed to give [place] permission to discuss obesity behavioral modification therapy today.  ASSESS: Kristin [name] has the diagnosis of obesity and her BMI today is 41.35 Kristin [name] is in the action stage of change   ADVISE: Kristin [name] was educated on the multiple health risks of obesity as well as the benefit of weight loss to improve her health. She was advised of the need for long term treatment and the importance of lifestyle modifications.  AGREE: Multiple dietary modification options and treatment options were discussed and  Kristin [name] agreed to keep a food journal with 1200 calories and 90 grams of protein daily We discussed the following Behavioral Modification Strategies today: increasing lean protein intake and work on meal planning and easy cooking plans

## 2017-08-22 ENCOUNTER — Other Ambulatory Visit: Payer: Self-pay | Admitting: Family Medicine

## 2017-08-26 ENCOUNTER — Other Ambulatory Visit: Payer: Self-pay | Admitting: Neurology

## 2017-09-06 ENCOUNTER — Ambulatory Visit (INDEPENDENT_AMBULATORY_CARE_PROVIDER_SITE_OTHER): Payer: Medicare Other | Admitting: Dietician

## 2017-09-06 VITALS — Ht 64.0 in | Wt 242.0 lb

## 2017-09-06 DIAGNOSIS — Z6841 Body Mass Index (BMI) 40.0 and over, adult: Secondary | ICD-10-CM | POA: Diagnosis not present

## 2017-09-06 NOTE — Progress Notes (Signed)
  Office: [phone]  /  Fax: [phone]     Kristin [name] has a diagnosis of prediabetes based on her elevated HgA1c and was informed this puts her at greater risk of developing diabetes. She is here today for nutrition counseling which includes her obesity treatment plan.  Kristin [name]'s weight today is 242 lbs, a 1 lb weight gain over the holidays. She admits not following her journaling meal plan (1200 calories 90 g protein) over the Thanksgiving and Christmas holidays. She states she did try to make healthier food choices and watched her portion sizes however admits increased intake of simple carbohydrates. She states she is ready to get back on track and continue her weight loss efforts.  Reviewed food nutrients ie protein, fats, simple and complex carbohydrates and how these affect insulin response. Focus on portion control,  avoiding simple carbohydrates and lower fat foods for ongoing wt loss efforts and glucose management. She agreed to follow our lower carbohydrate, higher protein meal plan.   Kristin [name] is on the following meal plan: lower carbohydrate higher protein.  Her  meal plan was individualized for maximum benefit.  Also discussed at length the following behavioral modifications to help maximize  Success: increasing lean protein intake, decreasing simple carbohydrates, increasing vegetables, increase water intake.   Kristin [name] has been instructed to work up to a goal of 150 minutes of combined cardio and strengthening exercise per week for weight loss and overall health benefits. Written information was provided and the following handouts were given low carbohydrate high protein meal plan/paleo meal plan.   Office: [phone]  /  Fax: [phone]  OBESITY BEHAVIORAL INTERVENTION VISIT  Today's visit was # 14 out of 22.  Starting weight: 244 lbs Starting date: 11/27/16 Today's weight : Weight: 242 lb (109.8 kg)  Today's date: 09/06/2017 Total lbs lost to date: 2 (Patients must lose 7 lbs in the  first 6 months to continue with counseling)   ASK: We discussed the diagnosis of obesity with Kristin [name] today and Kristin [name] agreed to give [place] permission to discuss obesity behavioral modification therapy today.  ASSESS: Kristin [name] has the diagnosis of obesity and her BMI today is 41.52 Kristin [name] is in the action stage of change   ADVISE: Kristin [name] was educated on the multiple health risks of obesity as well as the benefit of weight loss to improve her health. She was advised of the need for long term treatment and the importance of lifestyle modifications.  AGREE: Multiple dietary modification options and treatment options were discussed and  Kristin [name] agreed to follow a lower carbohydrate, vegetable and lean protein rich diet plan We discussed the following Behavioral Modification Stratagies today: increasing lean protein intake and decreasing simple carbohydrates   I have reviewed the above documentation for accuracy and completeness, and I agree with the above. -Dennard Nip, MD

## 2017-09-10 ENCOUNTER — Other Ambulatory Visit: Payer: Self-pay | Admitting: Family Medicine

## 2017-09-10 DIAGNOSIS — E559 Vitamin D deficiency, unspecified: Secondary | ICD-10-CM

## 2017-09-13 ENCOUNTER — Ambulatory Visit (INDEPENDENT_AMBULATORY_CARE_PROVIDER_SITE_OTHER): Payer: Medicare Other | Admitting: Physician Assistant

## 2017-09-13 DIAGNOSIS — F331 Major depressive disorder, recurrent, moderate: Secondary | ICD-10-CM | POA: Diagnosis not present

## 2017-09-20 ENCOUNTER — Ambulatory Visit (INDEPENDENT_AMBULATORY_CARE_PROVIDER_SITE_OTHER): Payer: Medicare Other | Admitting: Physician Assistant

## 2017-09-27 ENCOUNTER — Ambulatory Visit (INDEPENDENT_AMBULATORY_CARE_PROVIDER_SITE_OTHER): Payer: Medicare Other | Admitting: Physician Assistant

## 2017-09-27 ENCOUNTER — Encounter (INDEPENDENT_AMBULATORY_CARE_PROVIDER_SITE_OTHER): Payer: Self-pay

## 2017-09-27 DIAGNOSIS — F331 Major depressive disorder, recurrent, moderate: Secondary | ICD-10-CM | POA: Diagnosis not present

## 2017-10-02 ENCOUNTER — Other Ambulatory Visit: Payer: Self-pay | Admitting: Family Medicine

## 2017-10-08 DIAGNOSIS — L821 Other seborrheic keratosis: Secondary | ICD-10-CM | POA: Diagnosis not present

## 2017-10-08 DIAGNOSIS — D1801 Hemangioma of skin and subcutaneous tissue: Secondary | ICD-10-CM | POA: Diagnosis not present

## 2017-10-08 DIAGNOSIS — Z85828 Personal history of other malignant neoplasm of skin: Secondary | ICD-10-CM | POA: Diagnosis not present

## 2017-10-08 DIAGNOSIS — L814 Other melanin hyperpigmentation: Secondary | ICD-10-CM | POA: Diagnosis not present

## 2017-10-08 DIAGNOSIS — Z23 Encounter for immunization: Secondary | ICD-10-CM | POA: Diagnosis not present

## 2017-10-08 DIAGNOSIS — D225 Melanocytic nevi of trunk: Secondary | ICD-10-CM | POA: Diagnosis not present

## 2017-10-08 DIAGNOSIS — D485 Neoplasm of uncertain behavior of skin: Secondary | ICD-10-CM | POA: Diagnosis not present

## 2017-10-08 DIAGNOSIS — L570 Actinic keratosis: Secondary | ICD-10-CM | POA: Diagnosis not present

## 2017-10-09 DIAGNOSIS — F331 Major depressive disorder, recurrent, moderate: Secondary | ICD-10-CM | POA: Diagnosis not present

## 2017-10-11 NOTE — Progress Notes (Signed)
Subjective:   Kristin [name] is a 67 y.o. female who presents for an Initial Medicare Annual Wellness Visit.  Review of Systems   No ROS.  Medicare Wellness Visit. Additional risk factors are reflected in the social history. Cardiac Risk Factors include: advanced age (>68men, >11 women);dyslipidemia;hypertension;obesity (BMI >30kg/m2) Sleep patterns:Sleeps 8 hrs. Feels rested.    Home Safety/Smoke Alarms: Feels safe in home. Smoke alarms in place.  Living environment; residence and Firearm Safety: Lives with husband and 10 yr old son who has high functioning autism. Seat Belt Safety/Bike Helmet: Wears seat belt.   Female:   Pap- Hysterectomy.      Mammo- ORDERED       Dexa scan- last 12/13/15: normal      CCS- last 10/03/12. Recall 10 yrs    Objective:    Today's Vitals   10/15/17 1023  BP: 128/85  Pulse: 72  SpO2: 97%  Weight: 247 lb 12.8 oz (112.4 kg)  PainSc: 4    Body mass index is 42.53 kg/m.  Advanced Directives 10/15/2017 12/13/2016 11/03/2016  Does Patient Have a Medical Advance Directive? Yes Yes Yes  Type of Paramedic of [place];Living will [place];Living will [place];Living will  Does patient want to make changes to medical advance directive? No - Patient declined - No - Patient declined  Copy of [place] in Chart? Yes (No Data) Yes    Current Medications (verified) Outpatient Encounter Medications as of 10/15/2017  Medication Sig  . ALPRAZolam (XANAX) 0.25 MG tablet Take 0.5-1 tablets (0.125-0.25 mg total) by mouth 2 (two) times daily as needed for anxiety.  . carbidopa-levodopa (SINEMET CR) 50-200 MG tablet Take 1 tablet by mouth at bedtime.  . carbidopa-levodopa (SINEMET IR) 25-100 MG tablet TAKE 1 TABLET BY MOUTH 3 TIMES DAILY  . Cholecalciferol (VITAMIN D) 2000 units CAPS Take by mouth.  . DULoxetine (CYMBALTA) 30 MG capsule TAKE 1 CAPSULE BY MOUTH ONCE DAILY  .  escitalopram (LEXAPRO) 20 MG tablet Take 1 tablet (20 mg total) daily by mouth.  Marland Kitchen omeprazole (PRILOSEC) 20 MG capsule TAKE 1 CAPSULE BY MOUTH ONCE DAILY  . polyethylene glycol powder (GLYCOLAX/MIRALAX) powder Take 17 g by mouth 2 (two) times daily as needed (1 to 2 times per day, as needed).  . pramipexole (MIRAPEX) 0.5 MG tablet TAKE 2 TABLETS BY MOUTH IN THE MORNING AND 1 TABLET IN THE AFTERNOON AND 1 TABLET IN THE EVENING  . rosuvastatin (CRESTOR) 10 MG tablet TAKE 1 TABLET BY MOUTH ON MONDAYS, WEDNESDAYS AND FRIDAYS  . telmisartan (MICARDIS) 80 MG tablet Take 1 tablet (80 mg total) by mouth daily. (Patient taking differently: Take 40 mg by mouth daily. )  . Ferrous Sulfate (IRON) 325 (65 Fe) MG TABS Take 1 tablet (325 mg total) by mouth daily. (Patient not taking: Reported on 10/15/2017)  . Vitamin D, Ergocalciferol, (DRISDOL) 50000 units CAPS capsule TAKE 1 CAPSULE BY MOUTH EVERY 7 DAYS (Patient not taking: Reported on 10/15/2017)  . [DISCONTINUED] doxycycline (VIBRA-TABS) 100 MG tablet Take 1 tablet (100 mg total) 2 (two) times daily by mouth.   No facility-administered encounter medications on file as of 10/15/2017.     Allergies (verified) Bactrim [sulfamethoxazole-trimethoprim] and Penicillins   History: Past Medical History:  Diagnosis Date  . Abnormal vaginal Pap smear   . Acute pharyngitis 02/25/2013  . Anemia   . Anxiety   . Arthritis   . BCC (basal cell carcinoma of skin)  10/05/2014   On back  . Chicken pox as a child  . Chronic UTI    sees dr Terance Hart  . Constipation 12/07/2015  . Depression with anxiety 11/02/2009   Qualifier: Diagnosis of  By: [name], LPN, [name]   . Dermatitis 07/17/2012  . Esophageal stricture 1994  . Fibroids   . Foot pain, bilateral 06/19/2012  . GERD (gastroesophageal reflux disease)   . GERD (gastroesophageal reflux disease)   . Hiatal hernia   . Hyperglycemia 08/19/2013  . 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 Hyperlipidemia   . Hypertension    . Infertility, female   . Measles as a child  . Obesity   . Osteoarthritis   . Parkinson disease ([place])   . Plantar fasciitis of left foot 06/19/2012  . Preventative health care 12/19/2015  . Rosacea 10/05/2014  . Swallowing difficulty   . Urinary frequency 02/25/2013  . Visual floaters 05/11/2014   Past Surgical History:  Procedure Laterality Date  . ABDOMINAL HYSTERECTOMY  2006   total  . esophageal     stretching  . laporoscopy    . PARTIAL HIP ARTHROPLASTY  2010   right  . TONSILLECTOMY    . wisdom teeth extracted     Family History  Problem Relation Age of Onset  . Cancer Mother        breast  . Other Mother        arrythmia  . Mental illness Mother        bipolar  . Hyperlipidemia Mother   . Thyroid disease Mother   . Depression Mother   . Bipolar disorder Mother   . Heart disease Father   . Arthritis Father        rheumatoid  . Hypertension Father   . Hyperlipidemia Father   . Depression Sister   . Mental illness Sister        bipolar  . Parkinson's disease Sister   . Arthritis Sister   . Arthritis Sister   . Arthritis Sister   . Colon cancer Neg Hx   . Esophageal cancer Neg Hx   . Rectal cancer Neg Hx   . Stomach cancer Neg Hx    Social History   Socioeconomic History  . Marital status: Married    Spouse name: None  . Number of children: None  . Years of education: None  . Highest education level: None  Social Needs  . Financial resource strain: None  . Food insecurity - worry: None  . Food insecurity - inability: None  . Transportation needs - medical: None  . Transportation needs - non-medical: None  Occupational History  . Occupation: Retired Teacher, music  Tobacco Use  . Smoking status: Never Smoker  . Smokeless tobacco: Never Used  Substance and Sexual Activity  . Alcohol use: Yes    Alcohol/week: 2.4 oz    Types: 4 Glasses of wine per week    Comment: 2 glasses of wine weekly  . Drug use: No  . Sexual activity: Not Currently      Partners: Male  Other Topics Concern  . None  Social History Narrative   Lives with husband, no major dietary restrictions, retired from teaching      Husband dx with cancer MDS June 2017.   Currently in remission. (11/03/16 pc)       Tobacco Counseling Counseling given: Not Answered   Clinical Intake:     Pain : 0-10 Pain Score: 4  Pain Type: Chronic pain Pain  Location: Hand Pain Orientation: Right, Left Pain Onset: More than a month ago Pain Frequency: Constant Pain Relieving Factors: Aleve Effect of Pain on Daily Activities: affects anything that requires strength or fine motor skills with hands. Will discuss with PCP today.  Pain Relieving Factors: Aleve   Activities of Daily Living In your present state of health, do you have any difficulty performing the following activities: 10/15/2017 05/10/2017  Hearing? N N  Vision? N N  Comment wears glasses at all times. Eye doctor yearly. Pt states she is due for appt.  -  Difficulty concentrating or making decisions? N N  Walking or climbing stairs? N N  Dressing or bathing? N N  Doing errands, shopping? N N  Preparing Food and eating ? N -  Using the Toilet? N -  In the past six months, have you accidently leaked urine? N -  Do you have problems with loss of bowel control? N -  Managing your Medications? N -  Managing your Finances? N -  Housekeeping or managing your Housekeeping? N -  Some recent data might be hidden     Immunizations and Health Maintenance Immunization History  Administered Date(s) Administered  . Influenza Split 06/19/2012  . Influenza Whole 07/05/2007, 07/08/2009  . Influenza, High Dose Seasonal PF 05/04/2016, 05/10/2017  . Influenza,inj,Quad PF,6+ Mos 07/10/2013, 05/05/2014  . Pneumococcal Conjugate-13 07/10/2013  . Pneumococcal Polysaccharide-23 12/07/2015  . Td 09/04/2004  . Tdap 01/04/2015   There are no preventive care reminders to display for this patient.  Patient Care  Team: Mosie Lukes, MD as PCP - General (Family Medicine) Tat, Eustace Quail, DO as Consulting Physician (Neurology)  Indicate any recent Medical Services you may have received from other than [facility] providers in the past year (date may be approximate).     Assessment:   This is a routine wellness examination for [facility]. Physical assessment deferred to PCP.  Hearing/Vision screen No exam data present  Dietary issues and exercise activities discussed: Current Exercise Habits: Structured exercise class, Time (Minutes): 45, Frequency (Times/Week): 4, Weekly Exercise (Minutes/Week): 180, Intensity: Moderate Diet (meal preparation, eat out, water intake, caffeinated beverages, dairy products, fruits and vegetables): in general, a "healthy" diet  , well balanced   Goals    . Keep a postive mindset. (pt-stated)    . Weight (lb) < 230 lb (104.3 kg)      Depression Screen PHQ 2/9 Scores 10/15/2017 05/10/2017 11/27/2016 12/07/2015  PHQ - 2 Score 2 0 4 0  PHQ- 9 Score 2 0 13 -    Fall Risk Fall Risk  10/15/2017 06/19/2017 03/05/2017 10/31/2016 03/14/2016  Falls in the past year? No No No No No   Cognitive Function: MMSE - Mini Mental State Exam 10/15/2017  Orientation to time 5  Orientation to Place 5  Registration 3  Attention/ Calculation 5  Recall 3  Language- name 2 objects 2  Language- repeat 1  Language- follow 3 step command 3  Language- read & follow direction 1  Write a sentence 1  Copy design 1  Total score 30        Screening Tests Health Maintenance  Topic Date Due  . MAMMOGRAM  11/25/2017  . COLONOSCOPY  10/03/2022  . TETANUS/TDAP  01/03/2025  . INFLUENZA VACCINE  Completed  . DEXA SCAN  Completed  . Hepatitis C Screening  Completed  . PNA vac Low Risk Adult  Completed      Plan:   Follow up with PCP today  as scheduled.  Continue to eat heart healthy diet (full of fruits, vegetables, whole grains, lean protein, water--limit salt, fat, and sugar intake) and increase  physical activity as tolerated.  Continue doing brain stimulating activities (puzzles, reading, adult coloring books, staying active) to keep memory sharp.    I have personally reviewed and noted the following in the patient's chart:   . Medical and social history . Use of alcohol, tobacco or illicit drugs  . Current medications and supplements . Functional ability and status . Nutritional status . Physical activity . Advanced directives . List of other physicians . Hospitalizations, surgeries, and ER visits in previous 12 months . Vitals . Screenings to include cognitive, depression, and falls . Referrals and appointments  In addition, I have reviewed and discussed with patient certain preventive protocols, quality metrics, and best practice recommendations. A written personalized care plan for preventive services as well as general preventive health recommendations were provided to patient.     Shela Nevin, [place]   10/15/2017

## 2017-10-15 ENCOUNTER — Ambulatory Visit (INDEPENDENT_AMBULATORY_CARE_PROVIDER_SITE_OTHER): Payer: Medicare Other | Admitting: Family Medicine

## 2017-10-15 ENCOUNTER — Encounter: Payer: Self-pay | Admitting: Family Medicine

## 2017-10-15 VITALS — BP 128/85 | HR 72 | Wt 247.8 lb

## 2017-10-15 DIAGNOSIS — E785 Hyperlipidemia, unspecified: Secondary | ICD-10-CM | POA: Diagnosis not present

## 2017-10-15 DIAGNOSIS — G20 Parkinson's disease: Secondary | ICD-10-CM | POA: Diagnosis not present

## 2017-10-15 DIAGNOSIS — M79641 Pain in right hand: Secondary | ICD-10-CM | POA: Insufficient documentation

## 2017-10-15 DIAGNOSIS — M25549 Pain in joints of unspecified hand: Secondary | ICD-10-CM

## 2017-10-15 DIAGNOSIS — E870 Hyperosmolality and hypernatremia: Secondary | ICD-10-CM

## 2017-10-15 DIAGNOSIS — E559 Vitamin D deficiency, unspecified: Secondary | ICD-10-CM | POA: Diagnosis not present

## 2017-10-15 DIAGNOSIS — I10 Essential (primary) hypertension: Secondary | ICD-10-CM

## 2017-10-15 DIAGNOSIS — Z0000 Encounter for general adult medical examination without abnormal findings: Secondary | ICD-10-CM

## 2017-10-15 DIAGNOSIS — R7303 Prediabetes: Secondary | ICD-10-CM

## 2017-10-15 DIAGNOSIS — E669 Obesity, unspecified: Secondary | ICD-10-CM | POA: Diagnosis not present

## 2017-10-15 DIAGNOSIS — Z1239 Encounter for other screening for malignant neoplasm of breast: Secondary | ICD-10-CM

## 2017-10-15 LAB — LIPID PANEL
Cholesterol: 177 mg/dL (ref 0–200)
HDL: 60.6 mg/dL (ref >39.00)
LDL Cholesterol: 96 mg/dL (ref 0–99)
NonHDL: 116.41
Total CHOL/HDL Ratio: 3
Triglycerides: 103 mg/dL (ref 0.0–149.0)
VLDL: 20.6 mg/dL (ref 0.0–40.0)

## 2017-10-15 LAB — COMPREHENSIVE METABOLIC PANEL
ALT: 8 U/L (ref 0–35)
AST: 20 U/L (ref 0–37)
Albumin: 4.2 g/dL (ref 3.5–5.2)
Alkaline Phosphatase: 78 U/L (ref 39–117)
BUN: 15 mg/dL (ref 6–23)
CO2: 29 mEq/L (ref 19–32)
Calcium: 9.8 mg/dL (ref 8.4–10.5)
Chloride: 106 mEq/L (ref 96–112)
Creatinine, Ser: 0.78 mg/dL (ref 0.40–1.20)
GFR: 78.19 mL/min (ref >60.00)
Glucose, Bld: 99 mg/dL (ref 70–99)
Potassium: 5.1 mEq/L (ref 3.5–5.1)
Sodium: 146 mEq/L — ABNORMAL HIGH (ref 135–145)
Total Bilirubin: 0.5 mg/dL (ref 0.2–1.2)
Total Protein: 7.3 g/dL (ref 6.0–8.3)

## 2017-10-15 LAB — SEDIMENTATION RATE: Sed Rate: 7 mm/hr (ref 0–30)

## 2017-10-15 LAB — TSH: TSH: 1.16 u[IU]/mL (ref 0.35–4.50)

## 2017-10-15 LAB — HEMOGLOBIN A1C: Hgb A1c MFr Bld: 6.2 % (ref 4.6–6.5)

## 2017-10-15 LAB — VITAMIN D 25 HYDROXY (VIT D DEFICIENCY, FRACTURES): VITD: 37.43 ng/mL (ref 30.00–100.00)

## 2017-10-15 MED ORDER — DULOXETINE HCL 60 MG PO CPEP: 60.0000 mg | ORAL_CAPSULE | Freq: Every day | ORAL | 3 refills | Status: DC

## 2017-10-15 MED ORDER — ESCITALOPRAM OXALATE 10 MG PO TABS: 10.0000 mg | ORAL_TABLET | Freq: Every day | ORAL | 3 refills | Status: DC

## 2017-10-15 NOTE — Assessment & Plan Note (Signed)
Has  Worsening pain in right hand over past few months. Third finger is the worst with swelling and redness noted. Positive family history of RA and psoriasis. Check labs today

## 2017-10-15 NOTE — Assessment & Plan Note (Signed)
WNL on last check, continue supplements

## 2017-10-15 NOTE — Assessment & Plan Note (Signed)
Encouraged heart healthy diet, increase exercise, avoid trans fats, consider a krill oil cap daily 

## 2017-10-15 NOTE — Progress Notes (Signed)
Patient ID: Kristin [name], female   DOB: 01-14-1951, 67 y.o.   MRN: [id number]   Subjective:    Patient ID: Kristin [name], female    DOB: July 04, 1951, 67 y.o.   MRN: [id number]  Chief Complaint  Patient presents with  . Medicare Wellness    HPI Patient is in today for follow up. She is feeling well today but is overwhelmed by her husband's illness and her illness. She has worsening weakness and pain in her hands. She has not any recent febrile illness or acute hospitalizations. She notes anhedonia and fatigue but no suicidal ideation. She has recently started to exercise after not exercising for several months she notes she always feels better when she exercises. Denies CP/palp/SOB/HA/congestion/fevers/GI or GU c/o. Taking meds as prescribed  Past Medical History:  Diagnosis Date  . Abnormal vaginal Pap smear   . Acute pharyngitis 02/25/2013  . Anemia   . Anxiety   . Arthritis   . 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 Chicken pox as a child  . Chronic UTI    sees dr Terance Hart  . Constipation 12/07/2015  . Depression with anxiety 11/02/2009   Qualifier: Diagnosis of  By: [name], LPN, [name]   . Dermatitis 07/17/2012  . Esophageal stricture 1994  . Fibroids   . Foot pain, bilateral 06/19/2012  . GERD (gastroesophageal reflux disease)   . GERD (gastroesophageal reflux disease)   . Hiatal hernia   . Hyperglycemia 08/19/2013  . 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 Hyperlipidemia   . Hypertension   . Infertility, female   . Measles as a child  . Obesity   . Osteoarthritis   . Parkinson disease ([place])   . Plantar fasciitis of left foot 06/19/2012  . Preventative health care 12/19/2015  . Rosacea 10/05/2014  . Swallowing difficulty   . Urinary frequency 02/25/2013  . Visual floaters 05/11/2014    Past Surgical History:  Procedure Laterality Date  . ABDOMINAL HYSTERECTOMY  2006   total  . esophageal     stretching  . laporoscopy    . PARTIAL HIP ARTHROPLASTY   2010   right  . TONSILLECTOMY    . wisdom teeth extracted      Family History  Problem Relation Age of Onset  . Cancer Mother        breast  . Other Mother        arrythmia  . Mental illness Mother        bipolar  . Hyperlipidemia Mother   . Thyroid disease Mother   . Depression Mother   . Bipolar disorder Mother   . Heart disease Father   . Arthritis Father        rheumatoid  . Hypertension Father   . Hyperlipidemia Father   . Depression Sister   . Mental illness Sister        bipolar  . Parkinson's disease Sister   . Arthritis Sister   . Arthritis Sister   . Arthritis Sister   . Colon cancer Neg Hx   . Esophageal cancer Neg Hx   . Rectal cancer Neg Hx   . Stomach cancer Neg Hx     Social History   Socioeconomic History  . Marital status: Married    Spouse name: Not on file  . Number of children: Not on file  . Years of education: Not on file  . Highest education level: Not on file  Social Needs  .  Financial resource strain: Not on file  . Food insecurity - worry: Not on file  . Food insecurity - inability: Not on file  . Transportation needs - medical: Not on file  . Transportation needs - non-medical: Not on file  Occupational History  . Occupation: Retired Teacher, music  Tobacco Use  . Smoking status: Never Smoker  . Smokeless tobacco: Never Used  Substance and Sexual Activity  . Alcohol use: Yes    Alcohol/week: 2.4 oz    Types: 4 Glasses of wine per week    Comment: 2 glasses of wine weekly  . Drug use: No  . Sexual activity: Not Currently    Partners: Male  Other Topics Concern  . Not on file  Social History Narrative   Lives with husband, no major dietary restrictions, retired from teaching      Husband dx with cancer MDS June 2017.   Currently in remission. (11/03/16 pc)       Outpatient Medications Prior to Visit  Medication Sig Dispense Refill  . ALPRAZolam (XANAX) 0.25 MG tablet Take 0.5-1 tablets (0.125-0.25 mg total) by mouth 2  (two) times daily as needed for anxiety. 30 tablet 1  . carbidopa-levodopa (SINEMET CR) 50-200 MG tablet Take 1 tablet by mouth at bedtime. 90 tablet 1  . carbidopa-levodopa (SINEMET IR) 25-100 MG tablet TAKE 1 TABLET BY MOUTH 3 TIMES DAILY 90 tablet 5  . Cholecalciferol (VITAMIN D) 2000 units CAPS Take by mouth.    Marland Kitchen omeprazole (PRILOSEC) 20 MG capsule TAKE 1 CAPSULE BY MOUTH ONCE DAILY 90 capsule 0  . polyethylene glycol powder (GLYCOLAX/MIRALAX) powder Take 17 g by mouth 2 (two) times daily as needed (1 to 2 times per day, as needed). 850 g 0  . pramipexole (MIRAPEX) 0.5 MG tablet TAKE 2 TABLETS BY MOUTH IN THE MORNING AND 1 TABLET IN THE AFTERNOON AND 1 TABLET IN THE EVENING 360 tablet 0  . rosuvastatin (CRESTOR) 10 MG tablet TAKE 1 TABLET BY MOUTH ON MONDAYS, WEDNESDAYS AND FRIDAYS 54 tablet 0  . telmisartan (MICARDIS) 80 MG tablet Take 1 tablet (80 mg total) by mouth daily. (Patient taking differently: Take 40 mg by mouth daily. ) 90 tablet 1  . DULoxetine (CYMBALTA) 30 MG capsule TAKE 1 CAPSULE BY MOUTH ONCE DAILY 90 capsule 0  . escitalopram (LEXAPRO) 20 MG tablet Take 1 tablet (20 mg total) daily by mouth. 90 tablet 2  . Ferrous Sulfate (IRON) 325 (65 Fe) MG TABS Take 1 tablet (325 mg total) by mouth daily. (Patient not taking: Reported on 10/15/2017) 30 each 0  . Vitamin D, Ergocalciferol, (DRISDOL) 50000 units CAPS capsule TAKE 1 CAPSULE BY MOUTH EVERY 7 DAYS (Patient not taking: Reported on 10/15/2017) 4 capsule 0  . doxycycline (VIBRA-TABS) 100 MG tablet Take 1 tablet (100 mg total) 2 (two) times daily by mouth. 20 tablet 0   No facility-administered medications prior to visit.     Allergies  Allergen Reactions  . Bactrim [Sulfamethoxazole-Trimethoprim] Other (See Comments)    Oral ulcers and rash  . Penicillins Hives    Review of Systems  Constitutional: Positive for malaise/fatigue. Negative for fever.  HENT: Negative for congestion.   Eyes: Negative for blurred vision.    Respiratory: Positive for shortness of breath.   Cardiovascular: Negative for chest pain, palpitations and leg swelling.  Gastrointestinal: Negative for abdominal pain, blood in stool and nausea.  Genitourinary: Negative for dysuria and frequency.  Musculoskeletal: Negative for falls.  Skin: Negative for rash.  Neurological: Positive for dizziness, focal weakness and weakness. Negative for loss of consciousness and headaches.  Endo/Heme/Allergies: Negative for environmental allergies.  Psychiatric/Behavioral: Positive for depression. The patient is nervous/anxious.        Objective:    Physical Exam  Constitutional: She is oriented to person, place, and time. She appears well-developed and well-nourished. No distress.  HENT:  Head: Normocephalic and atraumatic.  Nose: Nose normal.  Eyes: Right eye exhibits no discharge. Left eye exhibits no discharge.  Neck: Normal range of motion. Neck supple.  Cardiovascular: Normal rate and regular rhythm.  No murmur heard. Pulmonary/Chest: Effort normal and breath sounds normal.  Abdominal: Soft. Bowel sounds are normal. There is no tenderness.  Musculoskeletal: She exhibits no edema.  Neurological: She is alert and oriented to person, place, and time.  Skin: Skin is warm and dry.  Psychiatric: She has a normal mood and affect.  Nursing note and vitals reviewed.   BP 128/85 (BP Location: Left Wrist, Patient Position: Sitting, Cuff Size: Normal)   Pulse 72   Wt 247 lb 12.8 oz (112.4 kg)   SpO2 97%   BMI 42.53 kg/m  Wt Readings from Last 3 Encounters:  10/15/17 247 lb 12.8 oz (112.4 kg)  09/06/17 242 lb (109.8 kg)  08/16/17 241 lb (109.3 kg)     Lab Results  Component Value Date   WBC 6.0 10/15/2017   HGB 10.9 (L) 10/15/2017   HCT 33.4 (L) 10/15/2017   PLT 239 10/15/2017   GLUCOSE 99 10/15/2017   CHOL 177 10/15/2017   TRIG 103.0 10/15/2017   HDL 60.60 10/15/2017   LDLDIRECT 161.5 10/07/2008   LDLCALC 96 10/15/2017   ALT 8  10/15/2017   AST 20 10/15/2017   NA 146 (H) 10/15/2017   K 5.1 10/15/2017   CL 106 10/15/2017   CREATININE 0.78 10/15/2017   BUN 15 10/15/2017   CO2 29 10/15/2017   TSH 1.16 10/15/2017   INR 2.5 (H) 01/19/2009   HGBA1C 6.2 10/15/2017    Lab Results  Component Value Date   TSH 1.16 10/15/2017   Lab Results  Component Value Date   WBC 6.0 10/15/2017   HGB 10.9 (L) 10/15/2017   HCT 33.4 (L) 10/15/2017   MCV 80.7 10/15/2017   PLT 239 10/15/2017   Lab Results  Component Value Date   NA 146 (H) 10/15/2017   K 5.1 10/15/2017   CO2 29 10/15/2017   GLUCOSE 99 10/15/2017   BUN 15 10/15/2017   CREATININE 0.78 10/15/2017   BILITOT 0.5 10/15/2017   ALKPHOS 78 10/15/2017   AST 20 10/15/2017   ALT 8 10/15/2017   PROT 7.3 10/15/2017   ALBUMIN 4.2 10/15/2017   CALCIUM 9.8 10/15/2017   GFR 78.19 10/15/2017   Lab Results  Component Value Date   CHOL 177 10/15/2017   Lab Results  Component Value Date   HDL 60.60 10/15/2017   Lab Results  Component Value Date   LDLCALC 96 10/15/2017   Lab Results  Component Value Date   TRIG 103.0 10/15/2017   Lab Results  Component Value Date   CHOLHDL 3 10/15/2017   Lab Results  Component Value Date   HGBA1C 6.2 10/15/2017       Assessment & Plan:   Problem List Items Addressed This Visit    Hyperlipidemia    Encouraged heart healthy diet, increase exercise, avoid trans fats, consider a krill oil cap daily      Relevant Orders   Lipid panel (Completed)  Obesity    Encouraged DASH diet, decrease po intake and increase exercise as tolerated. Needs 7-8 hours of sleep nightly. Avoid trans fats, eat small, frequent meals every 4-5 hours with lean proteins, complex carbs and healthy fats. Minimize simple carbs      Essential hypertension    Well controlled, no changes to meds. Encouraged heart healthy diet such as the DASH diet and exercise as tolerated.       Relevant Orders   Comprehensive metabolic panel (Completed)    TSH (Completed)   CBC (Completed)   Parkinson's disease ([place])    Follows with neurology. Is struggling with worsening weakness and shaking in hands especially the right hand.       Vitamin D deficiency    WNL on last check, continue supplements      Relevant Orders   VITAMIN D 25 Hydroxy (Vit-D Deficiency, Fractures) (Completed)   Prediabetes    minimize simple carbs. Increase exercise as tolerated.      Relevant Orders   Hemoglobin A1c (Completed)   Hand pain, right    Has  Worsening pain in right hand over past few months. Third finger is the worst with swelling and redness noted. Positive family history of RA and psoriasis. Check labs today      Relevant Orders   Rheumatoid Factor   Sedimentation rate (Completed)   Antinuclear Antib (ANA)    Other Visit Diagnoses    Breast cancer screening    -  Primary   Relevant Orders   MM DIGITAL SCREENING BILATERAL   Encounter for Medicare annual wellness exam          I have discontinued [name]. Spadoni's escitalopram, doxycycline, and DULoxetine. I am also having her start on escitalopram and DULoxetine. Additionally, I am having her maintain her polyethylene glycol powder, carbidopa-levodopa, ALPRAZolam, telmisartan, Iron, Vitamin D, carbidopa-levodopa, omeprazole, rosuvastatin, pramipexole, and Vitamin D (Ergocalciferol).  Meds ordered this encounter  Medications  . escitalopram (LEXAPRO) 10 MG tablet    Sig: Take 1 tablet (10 mg total) by mouth at bedtime.    Dispense:  30 tablet    Refill:  3  . DULoxetine (CYMBALTA) 60 MG capsule    Sig: Take 1 capsule (60 mg total) by mouth daily.    Dispense:  30 capsule    Refill:  3    [name], MD

## 2017-10-15 NOTE — Assessment & Plan Note (Signed)
Follows with neurology. Is struggling with worsening weakness and shaking in hands especially the right hand.

## 2017-10-15 NOTE — Patient Instructions (Addendum)
Continue to eat heart healthy diet (full of fruits, vegetables, whole grains, lean protein, water--limit salt, fat, and sugar intake) and increase physical activity as tolerated.  Continue doing brain stimulating activities (puzzles, reading, adult coloring books, staying active) to keep memory sharp.   Try EWG, [employer]   Kristin [name] , Thank you for taking time to come for your Medicare Wellness Visit. I appreciate your ongoing commitment to your health goals. Please review the following plan we discussed and let me know if I can assist you in the future.   These are the goals we discussed: Goals    . Keep a postive mindset. (pt-stated)    . Weight (lb) < 230 lb (104.3 kg)       This is a list of the screening recommended for you and due dates:  Health Maintenance  Topic Date Due  . Mammogram  11/25/2017  . Colon Cancer Screening  10/03/2022  . Tetanus Vaccine  01/03/2025  . Flu Shot  Completed  . DEXA scan (bone density measurement)  Completed  .  Hepatitis C: One time screening is recommended by Center for Disease Control  (CDC) for  adults born from [birth year] through 1966.   Completed  . Pneumonia vaccines  Completed    Health Maintenance for Postmenopausal Women Menopause is a normal process in which your reproductive ability comes to an end. This process happens gradually over a span of months to years, usually between the ages of 1 and 62. Menopause is complete when you have missed 12 consecutive menstrual periods. It is important to talk with your health care provider about some of the most common conditions that affect postmenopausal women, such as heart disease, cancer, and bone loss (osteoporosis). Adopting a healthy lifestyle and getting preventive care can help to promote your health and wellness. Those actions can also lower your chances of developing some of these common conditions. What should I know about menopause? During menopause, you may experience  a number of symptoms, such as:  Moderate-to-severe hot flashes.  Night sweats.  Decrease in sex drive.  Mood swings.  Headaches.  Tiredness.  Irritability.  Memory problems.  Insomnia.  Choosing to treat or not to treat menopausal changes is an individual decision that you make with your health care provider. What should I know about hormone replacement therapy and supplements? Hormone therapy products are effective for treating symptoms that are associated with menopause, such as hot flashes and night sweats. Hormone replacement carries certain risks, especially as you become older. If you are thinking about using estrogen or estrogen with progestin treatments, discuss the benefits and risks with your health care provider. What should I know about heart disease and stroke? Heart disease, heart attack, and stroke become more likely as you age. This may be due, in part, to the hormonal changes that your body experiences during menopause. These can affect how your body processes dietary fats, triglycerides, and cholesterol. Heart attack and stroke are both medical emergencies. There are many things that you can do to help prevent heart disease and stroke:  Have your blood pressure checked at least every 1-2 years. High blood pressure causes heart disease and increases the risk of stroke.  If you are 45-71 years old, ask your health care provider if you should take aspirin to prevent a heart attack or a stroke.  Do not use any tobacco products, including cigarettes, chewing tobacco, or electronic cigarettes. If you need help quitting, ask your health care  provider.  It is important to eat a healthy diet and maintain a healthy weight. ? Be sure to include plenty of vegetables, fruits, low-fat dairy products, and lean protein. ? Avoid eating foods that are high in solid fats, added sugars, or salt (sodium).  Get regular exercise. This is one of the most important things that you can  do for your health. ? Try to exercise for at least 150 minutes each week. The type of exercise that you do should increase your heart rate and make you sweat. This is known as moderate-intensity exercise. ? Try to do strengthening exercises at least twice each week. Do these in addition to the moderate-intensity exercise.  Know your numbers.Ask your health care provider to check your cholesterol and your blood glucose. Continue to have your blood tested as directed by your health care provider.  What should I know about cancer screening? There are several types of cancer. Take the following steps to reduce your risk and to catch any cancer development as early as possible. Breast Cancer  Practice breast self-awareness. ? This means understanding how your breasts normally appear and feel. ? It also means doing regular breast self-exams. Let your health care provider know about any changes, no matter how small.  If you are 24 or older, have a clinician do a breast exam (clinical breast exam or CBE) every year. Depending on your age, family history, and medical history, it may be recommended that you also have a yearly breast X-ray (mammogram).  If you have a family history of breast cancer, talk with your health care provider about genetic screening.  If you are at high risk for breast cancer, talk with your health care provider about having an MRI and a mammogram every year.  Breast cancer (BRCA) gene test is recommended for women who have family members with BRCA-related cancers. Results of the assessment will determine the need for genetic counseling and BRCA1 and for BRCA2 testing. BRCA-related cancers include these types: ? Breast. This occurs in males or females. ? Ovarian. ? Tubal. This may also be called fallopian tube cancer. ? Cancer of the abdominal or pelvic lining (peritoneal cancer). ? Prostate. ? Pancreatic.  Cervical, Uterine, and Ovarian Cancer Your health care provider  may recommend that you be screened regularly for cancer of the pelvic organs. These include your ovaries, uterus, and vagina. This screening involves a pelvic exam, which includes checking for microscopic changes to the surface of your cervix (Pap test).  For women ages 21-65, health care providers may recommend a pelvic exam and a Pap test every three years. For women ages 41-65, they may recommend the Pap test and pelvic exam, combined with testing for human papilloma virus (HPV), every five years. Some types of HPV increase your risk of cervical cancer. Testing for HPV may also be done on women of any age who have unclear Pap test results.  Other health care providers may not recommend any screening for nonpregnant women who are considered low risk for pelvic cancer and have no symptoms. Ask your health care provider if a screening pelvic exam is right for you.  If you have had past treatment for cervical cancer or a condition that could lead to cancer, you need Pap tests and screening for cancer for at least 20 years after your treatment. If Pap tests have been discontinued for you, your risk factors (such as having a new sexual partner) need to be reassessed to determine if you should start  having screenings again. Some women have medical problems that increase the chance of getting cervical cancer. In these cases, your health care provider may recommend that you have screening and Pap tests more often.  If you have a family history of uterine cancer or ovarian cancer, talk with your health care provider about genetic screening.  If you have vaginal bleeding after reaching menopause, tell your health care provider.  There are currently no reliable tests available to screen for ovarian cancer.  Lung Cancer Lung cancer screening is recommended for adults 32-19 years old who are at high risk for lung cancer because of a history of smoking. A yearly low-dose CT scan of the lungs is recommended if  you:  Currently smoke.  Have a history of at least 30 pack-years of smoking and you currently smoke or have quit within the past 15 years. A pack-year is smoking an average of one pack of cigarettes per day for one year.  Yearly screening should:  Continue until it has been 15 years since you quit.  Stop if you develop a health problem that would prevent you from having lung cancer treatment.  Colorectal Cancer  This type of cancer can be detected and can often be prevented.  Routine colorectal cancer screening usually begins at age 48 and continues through age 46.  If you have risk factors for colon cancer, your health care provider may recommend that you be screened at an earlier age.  If you have a family history of colorectal cancer, talk with your health care provider about genetic screening.  Your health care provider may also recommend using home test kits to check for hidden blood in your stool.  A small camera at the end of a tube can be used to examine your colon directly (sigmoidoscopy or colonoscopy). This is done to check for the earliest forms of colorectal cancer.  Direct examination of the colon should be repeated every 5-10 years until age 45. However, if early forms of precancerous polyps or small growths are found or if you have a family history or genetic risk for colorectal cancer, you may need to be screened more often.  Skin Cancer  Check your skin from head to toe regularly.  Monitor any moles. Be sure to tell your health care provider: ? About any new moles or changes in moles, especially if there is a change in a mole's shape or color. ? If you have a mole that is larger than the size of a pencil eraser.  If any of your family members has a history of skin cancer, especially at a young age, talk with your health care provider about genetic screening.  Always use sunscreen. Apply sunscreen liberally and repeatedly throughout the day.  Whenever you are  outside, protect yourself by wearing long sleeves, pants, a wide-brimmed hat, and sunglasses.  What should I know about osteoporosis? Osteoporosis is a condition in which bone destruction happens more quickly than new bone creation. After menopause, you may be at an increased risk for osteoporosis. To help prevent osteoporosis or the bone fractures that can happen because of osteoporosis, the following is recommended:  If you are 72-45 years old, get at least 1,000 mg of calcium and at least 600 mg of vitamin D per day.  If you are older than age 52 but younger than age 84, get at least 1,200 mg of calcium and at least 600 mg of vitamin D per day.  If you are older than  age 24, get at least 1,200 mg of calcium and at least 800 mg of vitamin D per day.  Smoking and excessive alcohol intake increase the risk of osteoporosis. Eat foods that are rich in calcium and vitamin D, and do weight-bearing exercises several times each week as directed by your health care provider. What should I know about how menopause affects my mental health? Depression may occur at any age, but it is more common as you become older. Common symptoms of depression include:  Low or sad mood.  Changes in sleep patterns.  Changes in appetite or eating patterns.  Feeling an overall lack of motivation or enjoyment of activities that you previously enjoyed.  Frequent crying spells.  Talk with your health care provider if you think that you are experiencing depression. What should I know about immunizations? It is important that you get and maintain your immunizations. These include:  Tetanus, diphtheria, and pertussis (Tdap) booster vaccine.  Influenza every year before the flu season begins.  Pneumonia vaccine.  Shingles vaccine.  Your health care provider may also recommend other immunizations. This information is not intended to replace advice given to you by your health care provider. Make sure you discuss  any questions you have with your health care provider. Document Released: 10/13/2005 Document Revised: 03/10/2016 Document Reviewed: 05/25/2015 Elsevier Interactive Patient Education  2018 [employer].

## 2017-10-15 NOTE — Assessment & Plan Note (Signed)
minimize simple carbs. Increase exercise as tolerated.  

## 2017-10-15 NOTE — Assessment & Plan Note (Signed)
Well controlled, no changes to meds. Encouraged heart healthy diet such as the DASH diet and exercise as tolerated.  °

## 2017-10-15 NOTE — Assessment & Plan Note (Signed)
Encouraged DASH diet, decrease po intake and increase exercise as tolerated. Needs 7-8 hours of sleep nightly. Avoid trans fats, eat small, frequent meals every 4-5 hours with lean proteins, complex carbs and healthy fats. Minimize simple carbs 

## 2017-10-16 ENCOUNTER — Other Ambulatory Visit: Payer: Self-pay | Admitting: Family Medicine

## 2017-10-16 DIAGNOSIS — R768 Other specified abnormal immunological findings in serum: Secondary | ICD-10-CM

## 2017-10-16 DIAGNOSIS — M79642 Pain in left hand: Secondary | ICD-10-CM

## 2017-10-16 DIAGNOSIS — M79641 Pain in right hand: Secondary | ICD-10-CM

## 2017-10-16 LAB — ANTI-NUCLEAR AB-TITER (ANA TITER): ANA Titer 1: 1:320 {titer} — ABNORMAL HIGH

## 2017-10-16 LAB — CBC
HCT: 33.4 % — ABNORMAL LOW (ref 35.0–45.0)
Hemoglobin: 10.9 g/dL — ABNORMAL LOW (ref 11.7–15.5)
MCH: 26.3 pg — ABNORMAL LOW (ref 27.0–33.0)
MCHC: 32.6 g/dL (ref 32.0–36.0)
MCV: 80.7 fL (ref 80.0–100.0)
MPV: 10.2 fL (ref 7.5–12.5)
Platelets: 239 10*3/uL (ref 140–400)
RBC: 4.14 10*6/uL (ref 3.80–5.10)
RDW: 15 % (ref 11.0–15.0)
WBC: 6 10*3/uL (ref 3.8–10.8)

## 2017-10-16 LAB — ANA: Anti Nuclear Antibody(ANA): POSITIVE — AB

## 2017-10-16 LAB — RHEUMATOID FACTOR: Rhuematoid fact SerPl-aCnc: 20 IU/mL — ABNORMAL HIGH (ref <14)

## 2017-10-16 MED ORDER — FERROUS FUMARATE 325 (106 FE) MG PO TABS: 1.0000 | ORAL_TABLET | Freq: Every day | ORAL | 1 refills | Status: DC

## 2017-10-16 NOTE — Addendum Note (Signed)
Addended by: Lawana Chambers on: 10/16/2017 06:33 PM   Modules accepted: Orders

## 2017-10-16 NOTE — Addendum Note (Signed)
Addended by: Lawana Chambers on: 10/16/2017 06:39 PM   Modules accepted: Orders

## 2017-10-18 ENCOUNTER — Telehealth: Payer: Self-pay | Admitting: Physical Therapy

## 2017-10-18 ENCOUNTER — Telehealth: Payer: Self-pay | Admitting: Neurology

## 2017-10-18 DIAGNOSIS — G20 Parkinson's disease: Secondary | ICD-10-CM

## 2017-10-18 NOTE — Telephone Encounter (Signed)
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  She agreed upon this at her discharge of previous therapies.  If you agree, could you please send orders via Epic?  Thank you, Mady Haagensen, PT

## 2017-10-18 NOTE — Telephone Encounter (Signed)
OT/ST order entered.

## 2017-10-22 DIAGNOSIS — F331 Major depressive disorder, recurrent, moderate: Secondary | ICD-10-CM | POA: Diagnosis not present

## 2017-10-22 NOTE — Progress Notes (Signed)
Subjective:    Kristin [name] was seen in consultation in the movement disorder clinic at the request of Mosie Lukes, MD.  The evaluation is for tremor.  The patient is a 67 y.o. left handed female with a history of tremor.  The records that were made available to me were reviewed.  According to records, the patient has complained about tremor to her previous primary care physician all the way back to 2012, but in those records she had said that tremor had been going on for years.  She states today, however, that she really thinks that it started in 2012.  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  R hand was always worse even though she is L hand dominant.  It has slowly progressed.  She reports that tremor is in both hands now (R still worse) but she feels tremor on the inside of the body all of the time.  She has tremor at rest now.  She feels nervous now and is unsure if it was related.  She also has the sweats and so she was placed on xanax but that doesn't help the sweating.  She recently went to an exercise class and noted that she wasn't as coordinated/strong with the R hand and that worried her and that is why she wanted a further evaluation.    She did see [name] in June, 2013 and I reviewed that note.  She was diagnosed with essential tremor.  No treatment was recommended.  There is  family hx of tremor in her father.  She states that she has been on metoprolol since 2012 and didn't think that it helped but when she tried to get off of it a year ago, the tremor got worse and she couldn't get off of it.    03/09/14 update:  The patient presents today for follow up.  Today, she is accompanied by her husband who helps to supplement the history.  He was not present on the prior visit.  She was diagnosed with PD last visit.  I started her on Mirapex.  She has been doing better in terms of tremor.  Interestingly, sweats and sleep have been better.  She  just retired last week.  No side effects with the Mirapex.  She is currently taking it at 9 AM/2 PM/9 PM. No compulsive behaviors.  No sleep attacks.  She has been attending therapy.  She is planning on starting the Parkinson's exercise class.  She has been educating herself and reading patient education material.  She had a modified barium evaluation on 02/25/2014.  It was normal, although they suspected that she had mild esophageal dysphagia.  Her husband asks multiple questions today and asked about potentially having another opinion.  The patient has had no falls.  No hallucinations.  07/15/14 update:  Pt is f/u today, accompanied by her husband who supplements the history.  Pt is on mirapex 0.5 mg three times per day.  I stopped her metoprolol last visit. She did fine with that. She is exercising with the Moves class and with circuit II class.  She is also enrolled in the bike class at the [facility].  She is having some soreness because of the exercise and hip pain because of it and she has had a hip replacement and worries about that as she doesn't want to have another.  Thinking about trying chiropractics for that.  She asks me about some.  Stages that she has on the lateral aspect of the knees.  She does not wear boots.  She has not had a knee replacement.  She also complains of some achiness in her toes and it gets better as she walks.  No falls.  No lightheadedness.    11/11/14 update:  The patient returns today for follow-up.  She is on Mirapex, 0.5 mg 3 times per day.  Since our last visit, the patient did go to [facility] for a second opinion.  She saw Dr. [name].  This was just 2 days ago.  No notes are available.  They state that they had a good visit, are glad that they went and got a second opinion and no new recommendations/treatments were given.  She has been doing well at home.  She is very active in our community exercise programs for PD.  She is enrolled in the [facility] exercise program and in our  circuit class and is walking on the treadmill.  She notices tremor in public but generally not as much at home.  She notes wearing off of the medication but only when time for the next dosage.    02/09/15 update:  The patient is following up today regarding her Parkinson's disease.  She is accompanied by her husband who supplements the history.  She did get a second opinion at [place] with Dr. [name] and his fellow, and [name], on 11/17/2014.  I reviewed those notes.  I told her that she could increase the Mirapex if needed to control tremor further.  She is currently on Mirapex 0.5 mg 3 times per day (7am/1:30 pm/7:00).  She is on Lexapro and Cymbalta for depression.  She is doing exercise.   She went to circuit class for PD today and she will have tremor when doing that.  She notes that when doing exercise, she has more tremor.  No falls.  She went to [place] for 2 weeks with a tour group and she felt that she was able to keep up with the group.  She felt better on this trip than she did 2 years ago on a trip.  05/12/15 update:  The patient falls up today regarding her Parkinson's disease. She is accompanied by her husband who supplements the history.   Last visit, I had her increase her Mirapex to 1 mg in the morning and she remained on 0.5 mg in the afternoon and 0.5 mg in the evening. She was initially tired with the increase in the medication but has adjusted now.  Her daughter and 8 month old grandson are living with them.  She is exercising on the treadmill.  She is doing the Parkinsons exercise class.  She is going to try the boxing class and is in the [employer] class and it is increasing to 2 days a week. She has been doing well with mood on a combination of Lexapro and Cymbalta.  She denies any falls.  No lightheadedness or near syncope.  No hallucinations.  08/12/15 update:  The patient follows up today, accompanied by her husband who supplements the history.  The patient is on pramipexole, 1.0 mg in the  morning, 0.5 mg in the afternoon and 0.5 mg in the evening.  She is also planning on attending the [facility] retreat as a patient, which should be very good for her.  She remains on Lexapro and Cymbalta.  They're working well for her.  She is thinking about getting a therapist and asks me about  that.  She also asks me about possible supplements for Parkinson's disease.  She continues to exercise a lot.  She has not had any falls.  No dyskinesia.  No lightheadedness or near syncope.  12/01/15 update:  The patient follows up today, accompanied by her husband who supplements the history.  The patient is on pramipexole, 1.0 mg in the morning, 0.5 mg in the afternoon and 0.5 mg in the evening.  She is also planning on attending the [facility] retreat as a patient, which should be very good for her.  Its in [place].  She remains on Lexapro and Cymbalta.  They're working well for her.  She continues to exercise a lot.  Had not been pole walking and just started and noted that her arms are very sore.  Still knows that she needs a therapist and asked about it last time and gave her Valeda Malm name but she hasn't done it.  She has not had any falls.  No dyskinesia.  No lightheadedness or near syncope.  She just saw Dr. [name] on 11/08/15 and I reviewed those notes.  No changes were made.    03/14/16 update:  The patient follows up today for her PD.  The patient is on pramipexole, 1.0 mg in the morning, 0.5 mg in the afternoon and 0.5 mg in the evening.  She attended the [facility] retreat in [place] as a patient.  She went at the end of May.  States that it was very challenging.   She remains on Lexapro and Cymbalta.  They're working well for her.  She continues to exercise a lot.  She has not had any falls.  No dyskinesia.  No lightheadedness or near syncope.  Noting some turning in of the L ankle. Not sure if related to timing of medication.   States that husband was dx with MDS since our last visit.  Because of that, she has not she has  not been able to attend PD therapy locally because she has been running back and forth to [place] with him.  Sister with dx with PD since our last visit.    10/31/16 update:  Patient follows up today.  She is on pramipexole, 1 mg in the morning, 0.5 mg in the afternoon and evening.  Last visit, we initiated levodopa and she worked her way to 1 tablet 3 times per day.  She has not had side effects.  No dyskinesia.  No falls.  Is having some dizziness or near syncope.  She admits that she is not hydrating well.  She is having more choking on water/liquid/saliva.  She is  planning to attend the [facility] retreat again in [place].  She has had a fairly rough time since our last visit, as she was in [place] with several months with her husband who had been awaiting a bone marrow transplant for leukemia.  He had this 06/2016.  He still won't leave the house because he is scared of germs but he has been given the clearance to do so.  She did join a gym and was exercising while she was in [place].  Pt is going to counseling, [employer] but her husband has been resistant to do so.  Pt went to social work PD support group.  She is getting back to exercise here.  She has started back to spears biking and [facility] classes.  She feels a bit defeated about how hard the boxing class is.   03/05/17 update:  Patient seen today  in follow-up.  She is on pramipexole, 1 mg in the morning and 0.5 mg in the afternoon and evening.  She is still on carbidopa/levodopa 25/100, one tablet 3 times per day.  Some cramping of the 2nd toe at night bilaterally.  She has been to the [employer] and [place] again since our last visit and did well with that.  Her sister did go with her.  Pt denies falls.  Finished PT and doing OT now.  Pt denies lightheadedness, near syncope.  No hallucinations.  Mood has been good. Still seeing Trevor Mace, her counselor.  She has an adult autistic son that has a college degree but has trouble keeping a job and that has  been stressful for her. Is seeing Dr. Leafy Ro now regarding weight loss.  Having some joint pain.  Going to see knee doctor as well.  Lets feel "itchy" at night and she is scratching them at night and getting exoriations on them from scratching.  06/19/17 update:  Patient today in follow-up for her Parkinson's disease.  Patient is on carbidopa/levodopa 25/100, one tablet 3 times per day.  We added carbidopa/levodopa 50/200 at bedtime last visit to see if that would help the restless leg and cramping of the feet and toes at night.  She states that helped a lot.  She is also on pramipexole, 1 mg in the morning and 0.5 mg in the afternoon and evening.  She did come to our Parkinson's symposium in July, accompanied by her husband.  She was pleased that he came to the event.  She denies any falls since our last visit.  She continues to exercise faithfully.  She is having a hard time emotionally doing everything for her husband.  She is going to counseling.  shes getting tired so she gave up boxing.  She is going to spin class.  She is eating to cope with depression.  10/23/17 update: The patient is seen today in follow-up for Parkinson's disease.  This patient is accompanied in the office by her spouse who supplements the history.  She is on carbidopa/levodopa 25/100, 1 tablet 3 times per day and carbidopa/levodopa 50/200 at bedtime.  She is also on pramipexole, 1 mg in the morning and 0.5 mg in the afternoon and evening. Husband notes less tremor.  She does have some tremor in the legs.  Some trouble attending [facility] moves classes due to husbands appts at [facility].  Husband thinks improving and easier to get off of the floor.   She has had no sleep attacks.  She has had no compulsive behaviors.  Pt denies falls.  Pt denies lightheadedness, near syncope.  No hallucinations.  Mood has been fair but lexapro/cymbalta dosages just changed.  Pt is going back to retreat in May in [place] and sister is going with her.  Having more  pain in hands and feet.  Had positive ANA and 1:320 and going to see Dr. Marijean Bravo.  RF was positive.  Enrolled in life coach program for PD and patient likes it.  Current/Previously tried tremor medications: metoprololol  Current medications that may exacerbate tremor:  Cymbalta, although I reviewed that the patient did try to change to Pristiq but the patient had nightmares with Pristiq and changed back to Cymbalta  Outside reports reviewed: historical medical records, lab reports and referral letter/letters.  Allergies  Allergen Reactions  . Bactrim [Sulfamethoxazole-Trimethoprim] Other (See Comments)    Oral ulcers and rash  . Penicillins Hives    Current Outpatient  Medications on File Prior to Visit  Medication Sig Dispense Refill  . ALPRAZolam (XANAX) 0.25 MG tablet Take 0.5-1 tablets (0.125-0.25 mg total) by mouth 2 (two) times daily as needed for anxiety. 30 tablet 1  . carbidopa-levodopa (SINEMET CR) 50-200 MG tablet Take 1 tablet by mouth at bedtime. 90 tablet 1  . carbidopa-levodopa (SINEMET IR) 25-100 MG tablet TAKE 1 TABLET BY MOUTH 3 TIMES DAILY 90 tablet 5  . Cholecalciferol (VITAMIN D) 2000 units CAPS Take by mouth.    . DULoxetine (CYMBALTA) 60 MG capsule Take 1 capsule (60 mg total) by mouth daily. 30 capsule 3  . escitalopram (LEXAPRO) 10 MG tablet Take 1 tablet (10 mg total) by mouth at bedtime. 30 tablet 3  . ferrous fumarate (HEMOCYTE - 106 MG FE) 325 (106 Fe) MG TABS tablet Take 1 tablet (106 mg of iron total) by mouth daily. 30 each 1  . omeprazole (PRILOSEC) 20 MG capsule TAKE 1 CAPSULE BY MOUTH ONCE DAILY 90 capsule 0  . polyethylene glycol powder (GLYCOLAX/MIRALAX) powder Take 17 g by mouth 2 (two) times daily as needed (1 to 2 times per day, as needed). 850 g 0  . pramipexole (MIRAPEX) 0.5 MG tablet TAKE 2 TABLETS BY MOUTH IN THE MORNING AND 1 TABLET IN THE AFTERNOON AND 1 TABLET IN THE EVENING 360 tablet 0  . rosuvastatin (CRESTOR) 10 MG tablet TAKE 1 TABLET BY  MOUTH ON MONDAYS, WEDNESDAYS AND FRIDAYS 54 tablet 0  . telmisartan (MICARDIS) 80 MG tablet Take 1 tablet (80 mg total) by mouth daily. (Patient taking differently: Take 40 mg by mouth daily. ) 90 tablet 1   No current facility-administered medications on file prior to visit.     Past Medical History:  Diagnosis Date  . Abnormal vaginal Pap smear   . Acute pharyngitis 02/25/2013  . Anemia   . Anxiety   . Arthritis   . 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 Chicken pox as a child  . Chronic UTI    sees dr Terance Hart  . Constipation 12/07/2015  . Depression with anxiety 11/02/2009   Qualifier: Diagnosis of  By: [name], LPN, [name]   . Dermatitis 07/17/2012  . Esophageal stricture 1994  . Fibroids   . Foot pain, bilateral 06/19/2012  . GERD (gastroesophageal reflux disease)   . GERD (gastroesophageal reflux disease)   . Hiatal hernia   . Hyperglycemia 08/19/2013  . 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 Hyperlipidemia   . Hypertension   . Infertility, female   . Measles as a child  . Obesity   . Osteoarthritis   . Parkinson disease ([place])   . Plantar fasciitis of left foot 06/19/2012  . Preventative health care 12/19/2015  . Rosacea 10/05/2014  . Swallowing difficulty   . Urinary frequency 02/25/2013  . Visual floaters 05/11/2014    Past Surgical History:  Procedure Laterality Date  . ABDOMINAL HYSTERECTOMY  2006   total  . esophageal     stretching  . laporoscopy    . PARTIAL HIP ARTHROPLASTY  2010   right  . TONSILLECTOMY    . wisdom teeth extracted      Social History   Socioeconomic History  . Marital status: Married    Spouse name: Not on file  . Number of children: Not on file  . Years of education: Not on file  . Highest education level: Not on file  Social Needs  . Financial resource strain: Not  on file  . Food insecurity - worry: Not on file  . Food insecurity - inability: Not on file  . Transportation needs - medical: Not on file  .  Transportation needs - non-medical: Not on file  Occupational History  . Occupation: Retired Teacher, music  Tobacco Use  . Smoking status: Never Smoker  . Smokeless tobacco: Never Used  Substance and Sexual Activity  . Alcohol use: Yes    Alcohol/week: 2.4 oz    Types: 4 Glasses of wine per week    Comment: 2 glasses of wine weekly  . Drug use: No  . Sexual activity: Not Currently    Partners: Male  Other Topics Concern  . Not on file  Social History Narrative   Lives with husband, no major dietary restrictions, retired from teaching      Husband dx with cancer MDS June 2017.   Currently in remission. (11/03/16 pc)       Family Status  Relation Name Status  . Mother  Deceased at age 23       Dementia, breast cancer, bipolar  . Father  Deceased at age 5       heart failure, rheumatoid arthritis  . Sister Florian Buff       arthritis, PD  . Sister Keturah Shavers  . Daughter Maudie Mercury- adopted Alive       13  . Son Jenny Reichmann- adopted Alive       45  . MGM  Deceased  . MGF  Deceased  . PGM  Deceased  . PGF  Deceased  . Sister Peg Alive        healthy  . Sister Robina Ade       healthy  . Daughter maggie Alive       28/ healthy  . Neg Hx  (Not Specified)    Review of Systems A complete 10 system ROS was obtained and was negative apart from what is mentioned.   Objective:   VITALS:   Vitals:   10/23/17 1424  BP: 126/82  Pulse: 78  SpO2: 97%  Weight: 251 lb (113.9 kg)  Height: 5\' 4"  (1.626 m)   Wt Readings from Last 3 Encounters:  10/23/17 251 lb (113.9 kg)  10/15/17 247 lb 12.8 oz (112.4 kg)  09/06/17 242 lb (109.8 kg)     Gen:  Appears stated age and in NAD.  She is diaphoretic (room is also hot) HEENT:  Normocephalic, atraumatic. The mucous membranes are moist.  Cardiovascular: Regular rate and rhythm Lungs: Clear to auscultation bilaterally Neck: No carotid bruits  NEUROLOGICAL:  Orientation: The patient is alert and oriented x3. Fund of knowledge is  appropriate.  Recent and remote memory are intact.  Attention and concentration are normal.    Able to name objects and repeat phrases. Cranial nerves: There is good facial symmetry. The visual fields are full to confrontational testing. The speech is fluent and clear. Soft palate rises symmetrically and there is no tongue deviation. Hearing is intact to conversational tone. Sensation: Sensation is intact to light touch throughout. Motor: Strength is 5/5 in the bilateral upper and lower extremities.   Shoulder shrug is equal and symmetric.  There is no pronator drift.   Movement examination: Tone: There is normal tone in the upper and lower extremity. Abnormal movements: There is an occasional RUE rest tremor Coordination:  There is mild decremation with toe taps on the right Gait and Station: The patient has no significant difficulty arising out of  a deep-seated chair without the use of the hands. The patient's stride length is fairly normal, and there is good, albeit somewhat purposeful, arm swing bilaterally.  LABS  Lab Results  Component Value Date   TSH 1.16 10/15/2017   Lab Results  Component Value Date   WBC 6.0 10/15/2017   HGB 10.9 (L) 10/15/2017   HCT 33.4 (L) 10/15/2017   MCV 80.7 10/15/2017   PLT 239 10/15/2017     Chemistry      Component Value Date/Time   NA 146 (H) 10/15/2017 1153   NA 141 11/27/2016 1123   K 5.1 10/15/2017 1153   CL 106 10/15/2017 1153   CO2 29 10/15/2017 1153   BUN 15 10/15/2017 1153   BUN 13 11/27/2016 1123   CREATININE 0.78 10/15/2017 1153   CREATININE 0.71 01/04/2015 1046      Component Value Date/Time   CALCIUM 9.8 10/15/2017 1153   ALKPHOS 78 10/15/2017 1153   AST 20 10/15/2017 1153   ALT 8 10/15/2017 1153   BILITOT 0.5 10/15/2017 1153   BILITOT 0.4 11/27/2016 1123     Lab Results  Component Value Date   WBC 6.0 10/15/2017   HGB 10.9 (L) 10/15/2017   HCT 33.4 (L) 10/15/2017   MCV 80.7 10/15/2017   PLT 239 10/15/2017   Lab  Results  Component Value Date   IRON 31 03/05/2017   TIBC 402 03/05/2017   FERRITIN 12 (L) 03/05/2017        Assessment/Plan:   1. idiopathic Parkinson's disease.  The patient has tremor, bradykinesia, rigidity and mild postural instability.  -continue pramipexole 1 mg in the morning and continue the 0.5 mg in the afternoon and evening.  She has had no compulsive side effects.  Risks, benefits, side effects and alternative therapies were discussed.  The opportunity to ask questions was given and they were answered to the best of my ability.  The patient expressed understanding and willingness to follow the outlined treatment protocols.  -Continue carbidopa/levodopa 25/100 one tablet tid.  Risks, benefits, side effects and alternative therapies were discussed.  The opportunity to ask questions was given and they were answered to the best of my ability.  The patient expressed understanding and willingness to follow the outlined treatment protocols.  -She will continue Carbidopa/levodopa 50/200 at night.  This has definitely helped restless leg and has helped most of the nighttime cramping of the feet and legs.  -needs to add back in boxing.  Is doing [facility] moves classes but has trouble with scheduling that with husbands doctors appts.  Is doing spin class.  -The patient asked me about CBD oil.  Discussed literature on that as it relates to Parkinson's disease.  Not recommended by AAN because of lack of controlled trials.  Talked about smaller trials in which marijuana helped tremor.  However, there is some data that suggests that it worsens cognition and falls.  The data also suggests that CBD oil is less effective than marijuana.  However, there have been concerns given the fact that CBD oil is unregulated and each manufacturer has different amounts of ingredient and the purity of each manufacturers ingredient has been called into question.  At this time, it is not recommended for the treatment of  Parkinson's disease.  Further studies do need to be completed. 2.  Depression and anxiety  - She is on a combination of Lexapro and Cymbalta and those dosages were just altered by PCP a few weeks ago  -She is  now seeing a counselor and will continue to do so and enrolled in PD life coaching 3.  Dizziness  -improved 4.  Dysphagia  -last MBE 12/2015.  Liquids becoming slightly more problematic.  Talked about chin tuck maneuver.  Use straw.   5.  Hand and feet pain  -has positive ANA at 1:320.  Has appt with Dr. Marijean Bravo in a month 6.  Follow up is anticipated in the next few months, sooner should new neurologic issues arise.  Much greater than 50% of this visit was spent in counseling and coordinating care.  Total face to face time:  40 min.  Many questions with husband as he has not been here in some time.  Answered to best of my ability today

## 2017-10-23 ENCOUNTER — Ambulatory Visit (INDEPENDENT_AMBULATORY_CARE_PROVIDER_SITE_OTHER): Payer: Medicare Other | Admitting: Neurology

## 2017-10-23 ENCOUNTER — Encounter: Payer: Self-pay | Admitting: Neurology

## 2017-10-23 VITALS — BP 126/82 | HR 78 | Ht 64.0 in | Wt 251.0 lb

## 2017-10-23 DIAGNOSIS — R768 Other specified abnormal immunological findings in serum: Secondary | ICD-10-CM

## 2017-10-23 DIAGNOSIS — M79641 Pain in right hand: Secondary | ICD-10-CM

## 2017-10-23 DIAGNOSIS — M79642 Pain in left hand: Secondary | ICD-10-CM

## 2017-10-23 DIAGNOSIS — G20 Parkinson's disease: Secondary | ICD-10-CM

## 2017-10-24 ENCOUNTER — Encounter: Payer: Self-pay | Admitting: Neurology

## 2017-10-29 DIAGNOSIS — F331 Major depressive disorder, recurrent, moderate: Secondary | ICD-10-CM | POA: Diagnosis not present

## 2017-11-06 ENCOUNTER — Encounter: Payer: Medicare Other | Admitting: Family Medicine

## 2017-11-08 ENCOUNTER — Ambulatory Visit: Payer: Medicare Other

## 2017-11-08 ENCOUNTER — Ambulatory Visit: Payer: Medicare Other | Admitting: Physical Therapy

## 2017-11-08 ENCOUNTER — Ambulatory Visit: Payer: Medicare Other | Admitting: Occupational Therapy

## 2017-11-08 DIAGNOSIS — M9903 Segmental and somatic dysfunction of lumbar region: Secondary | ICD-10-CM | POA: Diagnosis not present

## 2017-11-08 DIAGNOSIS — M545 Low back pain: Secondary | ICD-10-CM | POA: Diagnosis not present

## 2017-11-08 DIAGNOSIS — M5431 Sciatica, right side: Secondary | ICD-10-CM | POA: Diagnosis not present

## 2017-11-08 DIAGNOSIS — M5432 Sciatica, left side: Secondary | ICD-10-CM | POA: Diagnosis not present

## 2017-11-08 DIAGNOSIS — M62830 Muscle spasm of back: Secondary | ICD-10-CM | POA: Diagnosis not present

## 2017-11-11 ENCOUNTER — Other Ambulatory Visit: Payer: Self-pay | Admitting: Family Medicine

## 2017-11-12 ENCOUNTER — Telehealth: Payer: Self-pay | Admitting: Neurology

## 2017-11-12 DIAGNOSIS — G20 Parkinson's disease: Secondary | ICD-10-CM

## 2017-11-12 DIAGNOSIS — M5432 Sciatica, left side: Secondary | ICD-10-CM | POA: Diagnosis not present

## 2017-11-12 DIAGNOSIS — M62830 Muscle spasm of back: Secondary | ICD-10-CM | POA: Diagnosis not present

## 2017-11-12 DIAGNOSIS — M5431 Sciatica, right side: Secondary | ICD-10-CM | POA: Diagnosis not present

## 2017-11-12 DIAGNOSIS — M545 Low back pain: Secondary | ICD-10-CM | POA: Diagnosis not present

## 2017-11-12 DIAGNOSIS — M9903 Segmental and somatic dysfunction of lumbar region: Secondary | ICD-10-CM | POA: Diagnosis not present

## 2017-11-12 NOTE — Telephone Encounter (Signed)
Patient states she had screening and was okay, so appts were moved to October for repeat screenings for PT/OT/ST.  She does feel like she needs physical therapy before her retreat this summer. New order sent to [facility]. They will contact patient to schedule.

## 2017-11-12 NOTE — Telephone Encounter (Signed)
Patient wanted a referral to physical therapy and wanted to speak with Luvenia Starch about getting that taken care of

## 2017-11-13 DIAGNOSIS — M9903 Segmental and somatic dysfunction of lumbar region: Secondary | ICD-10-CM | POA: Diagnosis not present

## 2017-11-13 DIAGNOSIS — M5431 Sciatica, right side: Secondary | ICD-10-CM | POA: Diagnosis not present

## 2017-11-13 DIAGNOSIS — M5432 Sciatica, left side: Secondary | ICD-10-CM | POA: Diagnosis not present

## 2017-11-13 DIAGNOSIS — M62830 Muscle spasm of back: Secondary | ICD-10-CM | POA: Diagnosis not present

## 2017-11-13 DIAGNOSIS — M545 Low back pain: Secondary | ICD-10-CM | POA: Diagnosis not present

## 2017-11-15 DIAGNOSIS — M545 Low back pain: Secondary | ICD-10-CM | POA: Diagnosis not present

## 2017-11-15 DIAGNOSIS — M62830 Muscle spasm of back: Secondary | ICD-10-CM | POA: Diagnosis not present

## 2017-11-15 DIAGNOSIS — M9903 Segmental and somatic dysfunction of lumbar region: Secondary | ICD-10-CM | POA: Diagnosis not present

## 2017-11-15 DIAGNOSIS — M5431 Sciatica, right side: Secondary | ICD-10-CM | POA: Diagnosis not present

## 2017-11-15 DIAGNOSIS — M5432 Sciatica, left side: Secondary | ICD-10-CM | POA: Diagnosis not present

## 2017-11-16 ENCOUNTER — Other Ambulatory Visit (INDEPENDENT_AMBULATORY_CARE_PROVIDER_SITE_OTHER): Payer: Medicare Other

## 2017-11-16 DIAGNOSIS — E785 Hyperlipidemia, unspecified: Secondary | ICD-10-CM

## 2017-11-16 DIAGNOSIS — R739 Hyperglycemia, unspecified: Secondary | ICD-10-CM | POA: Diagnosis not present

## 2017-11-16 DIAGNOSIS — E669 Obesity, unspecified: Secondary | ICD-10-CM

## 2017-11-16 DIAGNOSIS — I10 Essential (primary) hypertension: Secondary | ICD-10-CM | POA: Diagnosis not present

## 2017-11-16 LAB — COMPREHENSIVE METABOLIC PANEL
ALT: 5 U/L (ref 0–35)
AST: 18 U/L (ref 0–37)
Albumin: 3.9 g/dL (ref 3.5–5.2)
Alkaline Phosphatase: 69 U/L (ref 39–117)
BUN: 25 mg/dL — ABNORMAL HIGH (ref 6–23)
CO2: 30 mEq/L (ref 19–32)
Calcium: 9.4 mg/dL (ref 8.4–10.5)
Chloride: 105 mEq/L (ref 96–112)
Creatinine, Ser: 0.79 mg/dL (ref 0.40–1.20)
GFR: 77.03 mL/min (ref >60.00)
Glucose, Bld: 107 mg/dL — ABNORMAL HIGH (ref 70–99)
Potassium: 4.3 mEq/L (ref 3.5–5.1)
Sodium: 141 mEq/L (ref 135–145)
Total Bilirubin: 0.5 mg/dL (ref 0.2–1.2)
Total Protein: 6.5 g/dL (ref 6.0–8.3)

## 2017-11-16 LAB — CBC
HCT: 35 % — ABNORMAL LOW (ref 36.0–46.0)
Hemoglobin: 11.3 g/dL — ABNORMAL LOW (ref 12.0–15.0)
MCHC: 32.3 g/dL (ref 30.0–36.0)
MCV: 83.7 fl (ref 78.0–100.0)
Platelets: 240 10*3/uL (ref 150.0–400.0)
RBC: 4.18 Mil/uL (ref 3.87–5.11)
RDW: 18.9 % — ABNORMAL HIGH (ref 11.5–15.5)
WBC: 5.2 10*3/uL (ref 4.0–10.5)

## 2017-11-16 LAB — TSH: TSH: 1.7 u[IU]/mL (ref 0.35–4.50)

## 2017-11-16 LAB — HEMOGLOBIN A1C: Hgb A1c MFr Bld: 5.8 % (ref 4.6–6.5)

## 2017-11-16 LAB — LIPID PANEL
Cholesterol: 152 mg/dL (ref 0–200)
HDL: 55.3 mg/dL (ref >39.00)
LDL Cholesterol: 78 mg/dL (ref 0–99)
NonHDL: 96.72
Total CHOL/HDL Ratio: 3
Triglycerides: 94 mg/dL (ref 0.0–149.0)
VLDL: 18.8 mg/dL (ref 0.0–40.0)

## 2017-11-18 ENCOUNTER — Other Ambulatory Visit: Payer: Self-pay | Admitting: Neurology

## 2017-11-19 DIAGNOSIS — M545 Low back pain: Secondary | ICD-10-CM | POA: Diagnosis not present

## 2017-11-19 DIAGNOSIS — M5431 Sciatica, right side: Secondary | ICD-10-CM | POA: Diagnosis not present

## 2017-11-19 DIAGNOSIS — F331 Major depressive disorder, recurrent, moderate: Secondary | ICD-10-CM | POA: Diagnosis not present

## 2017-11-19 DIAGNOSIS — M62830 Muscle spasm of back: Secondary | ICD-10-CM | POA: Diagnosis not present

## 2017-11-19 DIAGNOSIS — M9903 Segmental and somatic dysfunction of lumbar region: Secondary | ICD-10-CM | POA: Diagnosis not present

## 2017-11-19 DIAGNOSIS — M5432 Sciatica, left side: Secondary | ICD-10-CM | POA: Diagnosis not present

## 2017-11-21 DIAGNOSIS — M5431 Sciatica, right side: Secondary | ICD-10-CM | POA: Diagnosis not present

## 2017-11-21 DIAGNOSIS — M5432 Sciatica, left side: Secondary | ICD-10-CM | POA: Diagnosis not present

## 2017-11-21 DIAGNOSIS — M62830 Muscle spasm of back: Secondary | ICD-10-CM | POA: Diagnosis not present

## 2017-11-21 DIAGNOSIS — M9903 Segmental and somatic dysfunction of lumbar region: Secondary | ICD-10-CM | POA: Diagnosis not present

## 2017-11-21 DIAGNOSIS — M545 Low back pain: Secondary | ICD-10-CM | POA: Diagnosis not present

## 2017-11-26 ENCOUNTER — Other Ambulatory Visit: Payer: Self-pay | Admitting: Neurology

## 2017-11-26 DIAGNOSIS — M545 Low back pain: Secondary | ICD-10-CM | POA: Diagnosis not present

## 2017-11-26 DIAGNOSIS — M9903 Segmental and somatic dysfunction of lumbar region: Secondary | ICD-10-CM | POA: Diagnosis not present

## 2017-11-26 DIAGNOSIS — M5431 Sciatica, right side: Secondary | ICD-10-CM | POA: Diagnosis not present

## 2017-11-26 DIAGNOSIS — M5432 Sciatica, left side: Secondary | ICD-10-CM | POA: Diagnosis not present

## 2017-11-26 DIAGNOSIS — M62830 Muscle spasm of back: Secondary | ICD-10-CM | POA: Diagnosis not present

## 2017-11-27 DIAGNOSIS — M545 Low back pain: Secondary | ICD-10-CM | POA: Diagnosis not present

## 2017-11-27 DIAGNOSIS — M5416 Radiculopathy, lumbar region: Secondary | ICD-10-CM | POA: Diagnosis not present

## 2017-11-28 DIAGNOSIS — Z6841 Body Mass Index (BMI) 40.0 and over, adult: Secondary | ICD-10-CM | POA: Diagnosis not present

## 2017-11-28 DIAGNOSIS — M2550 Pain in unspecified joint: Secondary | ICD-10-CM | POA: Diagnosis not present

## 2017-11-28 DIAGNOSIS — M150 Primary generalized (osteo)arthritis: Secondary | ICD-10-CM | POA: Diagnosis not present

## 2017-11-28 DIAGNOSIS — R5383 Other fatigue: Secondary | ICD-10-CM | POA: Diagnosis not present

## 2017-11-28 DIAGNOSIS — M7989 Other specified soft tissue disorders: Secondary | ICD-10-CM | POA: Diagnosis not present

## 2017-11-28 DIAGNOSIS — R768 Other specified abnormal immunological findings in serum: Secondary | ICD-10-CM | POA: Diagnosis not present

## 2017-11-28 DIAGNOSIS — Z8261 Family history of arthritis: Secondary | ICD-10-CM | POA: Diagnosis not present

## 2017-11-28 DIAGNOSIS — G20 Parkinson's disease: Secondary | ICD-10-CM | POA: Diagnosis not present

## 2017-11-29 ENCOUNTER — Telehealth: Payer: Self-pay

## 2017-11-29 DIAGNOSIS — M5416 Radiculopathy, lumbar region: Secondary | ICD-10-CM | POA: Diagnosis not present

## 2017-11-29 DIAGNOSIS — M545 Low back pain: Secondary | ICD-10-CM | POA: Diagnosis not present

## 2017-11-29 DIAGNOSIS — M1711 Unilateral primary osteoarthritis, right knee: Secondary | ICD-10-CM

## 2017-11-29 NOTE — Telephone Encounter (Signed)
Copied from [place]. Topic: Referral - Medical Records >> Nov 28, 2017  9:22 AM Arletha Grippe wrote: Reason for CRM: cindy from [name] needs to have labs for rheumatoid factor  Fax [phone] Cb is [phone] ext [phone]      Labs have been placed

## 2017-12-03 DIAGNOSIS — F331 Major depressive disorder, recurrent, moderate: Secondary | ICD-10-CM | POA: Diagnosis not present

## 2017-12-04 DIAGNOSIS — M545 Low back pain: Secondary | ICD-10-CM | POA: Diagnosis not present

## 2017-12-04 DIAGNOSIS — M5416 Radiculopathy, lumbar region: Secondary | ICD-10-CM | POA: Diagnosis not present

## 2017-12-06 DIAGNOSIS — M5416 Radiculopathy, lumbar region: Secondary | ICD-10-CM | POA: Diagnosis not present

## 2017-12-06 DIAGNOSIS — M545 Low back pain: Secondary | ICD-10-CM | POA: Diagnosis not present

## 2017-12-08 ENCOUNTER — Other Ambulatory Visit: Payer: Self-pay | Admitting: Family Medicine

## 2017-12-11 ENCOUNTER — Encounter: Payer: Self-pay | Admitting: Family Medicine

## 2017-12-11 ENCOUNTER — Ambulatory Visit: Payer: Self-pay | Admitting: *Deleted

## 2017-12-11 MED ORDER — MUPIROCIN 2 % EX OINT: 1.0000 "application " | TOPICAL_OINTMENT | Freq: Two times a day (BID) | CUTANEOUS | 0 refills | Status: AC

## 2017-12-11 NOTE — Telephone Encounter (Signed)
Pt is taking prednisone prescribed to her by an orthopedist for a problem with her back. She states that she was put on a prednisone dose pack and now has 2 days of meds left.  She feels like this is causing  cold sores to come up on her chin. The biggest one is about a half inch and  There are 2 smaller ones under the skin. She states that they itch or blistering. She was asking if she should stop taking the prednisone. I advised he to check with the orthopedist that prescribed  this and I would let her pcp know what is going with her. She is going to take a picture and send it to the office thru [place]. No protocol found for this.  Will send this to Primary Care at [facility].  Answer Assessment - Initial Assessment Questions 1. SYMPTOMS: "Do you have any symptoms?"     Cold sore that have come out on her chin 2. SEVERITY: If symptoms are present, ask "Are they mild, moderate or severe?"     mild  Protocols used: MEDICATION QUESTION CALL-A-AH

## 2017-12-11 NOTE — Telephone Encounter (Signed)
Spoke with patient and let her know we have sent in a cream for her face  Patient notified

## 2017-12-12 DIAGNOSIS — M545 Low back pain: Secondary | ICD-10-CM | POA: Diagnosis not present

## 2017-12-12 DIAGNOSIS — M5416 Radiculopathy, lumbar region: Secondary | ICD-10-CM | POA: Diagnosis not present

## 2017-12-16 ENCOUNTER — Other Ambulatory Visit: Payer: Self-pay | Admitting: Neurology

## 2017-12-17 DIAGNOSIS — F331 Major depressive disorder, recurrent, moderate: Secondary | ICD-10-CM | POA: Diagnosis not present

## 2017-12-17 DIAGNOSIS — M5416 Radiculopathy, lumbar region: Secondary | ICD-10-CM | POA: Diagnosis not present

## 2017-12-17 DIAGNOSIS — M545 Low back pain: Secondary | ICD-10-CM | POA: Diagnosis not present

## 2017-12-18 ENCOUNTER — Other Ambulatory Visit: Payer: Self-pay | Admitting: Orthopedic Surgery

## 2017-12-18 DIAGNOSIS — M5416 Radiculopathy, lumbar region: Secondary | ICD-10-CM | POA: Diagnosis not present

## 2017-12-18 DIAGNOSIS — M545 Low back pain: Secondary | ICD-10-CM

## 2017-12-19 DIAGNOSIS — M0579 Rheumatoid arthritis with rheumatoid factor of multiple sites without organ or systems involvement: Secondary | ICD-10-CM | POA: Diagnosis not present

## 2017-12-19 DIAGNOSIS — M7989 Other specified soft tissue disorders: Secondary | ICD-10-CM | POA: Diagnosis not present

## 2017-12-19 DIAGNOSIS — R768 Other specified abnormal immunological findings in serum: Secondary | ICD-10-CM | POA: Diagnosis not present

## 2017-12-19 DIAGNOSIS — M2550 Pain in unspecified joint: Secondary | ICD-10-CM | POA: Diagnosis not present

## 2017-12-19 DIAGNOSIS — M150 Primary generalized (osteo)arthritis: Secondary | ICD-10-CM | POA: Diagnosis not present

## 2017-12-19 DIAGNOSIS — G20 Parkinson's disease: Secondary | ICD-10-CM | POA: Diagnosis not present

## 2017-12-19 DIAGNOSIS — Z8261 Family history of arthritis: Secondary | ICD-10-CM | POA: Diagnosis not present

## 2017-12-19 DIAGNOSIS — R5383 Other fatigue: Secondary | ICD-10-CM | POA: Diagnosis not present

## 2017-12-19 DIAGNOSIS — Z6841 Body Mass Index (BMI) 40.0 and over, adult: Secondary | ICD-10-CM | POA: Diagnosis not present

## 2017-12-24 ENCOUNTER — Ambulatory Visit
Admission: RE | Admit: 2017-12-24 | Discharge: 2017-12-24 | Disposition: A | Discharge status: Home / Self Care | Payer: Medicare Other | Source: Ambulatory Visit | Attending: Orthopedic Surgery | Admitting: Orthopedic Surgery

## 2017-12-24 DIAGNOSIS — M545 Low back pain: Secondary | ICD-10-CM

## 2017-12-24 DIAGNOSIS — M48061 Spinal stenosis, lumbar region without neurogenic claudication: Secondary | ICD-10-CM | POA: Diagnosis not present

## 2017-12-25 DIAGNOSIS — M545 Low back pain: Secondary | ICD-10-CM | POA: Diagnosis not present

## 2017-12-25 DIAGNOSIS — M5416 Radiculopathy, lumbar region: Secondary | ICD-10-CM | POA: Diagnosis not present

## 2017-12-27 DIAGNOSIS — M545 Low back pain: Secondary | ICD-10-CM | POA: Diagnosis not present

## 2017-12-27 DIAGNOSIS — F331 Major depressive disorder, recurrent, moderate: Secondary | ICD-10-CM | POA: Diagnosis not present

## 2017-12-27 DIAGNOSIS — M5416 Radiculopathy, lumbar region: Secondary | ICD-10-CM | POA: Diagnosis not present

## 2017-12-28 ENCOUNTER — Other Ambulatory Visit: Payer: Self-pay | Admitting: Family Medicine

## 2017-12-28 DIAGNOSIS — K219 Gastro-esophageal reflux disease without esophagitis: Secondary | ICD-10-CM

## 2017-12-29 ENCOUNTER — Other Ambulatory Visit: Payer: Self-pay | Admitting: Family Medicine

## 2018-01-01 ENCOUNTER — Other Ambulatory Visit: Payer: Self-pay | Admitting: *Deleted

## 2018-01-01 MED ORDER — FERROUS FUMARATE 325 (106 FE) MG PO TABS: 1.0000 | ORAL_TABLET | Freq: Every day | ORAL | 1 refills | Status: DC

## 2018-01-07 DIAGNOSIS — M5126 Other intervertebral disc displacement, lumbar region: Secondary | ICD-10-CM | POA: Diagnosis not present

## 2018-01-07 DIAGNOSIS — I10 Essential (primary) hypertension: Secondary | ICD-10-CM | POA: Diagnosis not present

## 2018-01-07 DIAGNOSIS — M48061 Spinal stenosis, lumbar region without neurogenic claudication: Secondary | ICD-10-CM | POA: Diagnosis not present

## 2018-01-07 DIAGNOSIS — M5416 Radiculopathy, lumbar region: Secondary | ICD-10-CM | POA: Diagnosis not present

## 2018-01-07 DIAGNOSIS — Z6841 Body Mass Index (BMI) 40.0 and over, adult: Secondary | ICD-10-CM | POA: Diagnosis not present

## 2018-01-07 DIAGNOSIS — M545 Low back pain: Secondary | ICD-10-CM | POA: Diagnosis not present

## 2018-01-08 ENCOUNTER — Ambulatory Visit (INDEPENDENT_AMBULATORY_CARE_PROVIDER_SITE_OTHER): Payer: Medicare Other | Admitting: Family Medicine

## 2018-01-08 ENCOUNTER — Encounter: Payer: Self-pay | Admitting: Family Medicine

## 2018-01-08 VITALS — BP 100/68 | HR 79 | Temp 98.3°F | Resp 18 | Wt 250.6 lb

## 2018-01-08 DIAGNOSIS — E559 Vitamin D deficiency, unspecified: Secondary | ICD-10-CM | POA: Diagnosis not present

## 2018-01-08 DIAGNOSIS — Z79899 Other long term (current) drug therapy: Secondary | ICD-10-CM | POA: Diagnosis not present

## 2018-01-08 DIAGNOSIS — G20 Parkinson's disease: Secondary | ICD-10-CM

## 2018-01-08 DIAGNOSIS — D649 Anemia, unspecified: Secondary | ICD-10-CM

## 2018-01-08 DIAGNOSIS — F418 Other specified anxiety disorders: Secondary | ICD-10-CM

## 2018-01-08 DIAGNOSIS — E785 Hyperlipidemia, unspecified: Secondary | ICD-10-CM | POA: Diagnosis not present

## 2018-01-08 DIAGNOSIS — I10 Essential (primary) hypertension: Secondary | ICD-10-CM

## 2018-01-08 DIAGNOSIS — K219 Gastro-esophageal reflux disease without esophagitis: Secondary | ICD-10-CM

## 2018-01-08 DIAGNOSIS — E669 Obesity, unspecified: Secondary | ICD-10-CM | POA: Diagnosis not present

## 2018-01-08 DIAGNOSIS — M5416 Radiculopathy, lumbar region: Secondary | ICD-10-CM | POA: Diagnosis not present

## 2018-01-08 DIAGNOSIS — M069 Rheumatoid arthritis, unspecified: Secondary | ICD-10-CM | POA: Insufficient documentation

## 2018-01-08 DIAGNOSIS — G20A1 Parkinson's disease without dyskinesia, without mention of fluctuations: Secondary | ICD-10-CM

## 2018-01-08 HISTORY — DX: Rheumatoid arthritis, unspecified: M06.9

## 2018-01-08 LAB — CBC WITH DIFFERENTIAL/PLATELET
Basophils Absolute: 0 10*3/uL (ref 0.0–0.1)
Basophils Relative: 0.6 % (ref 0.0–3.0)
Eosinophils Absolute: 0.1 10*3/uL (ref 0.0–0.7)
Eosinophils Relative: 1.4 % (ref 0.0–5.0)
HCT: 38.6 % (ref 36.0–46.0)
Hemoglobin: 12.8 g/dL (ref 12.0–15.0)
Lymphocytes Relative: 28.6 % (ref 12.0–46.0)
Lymphs Abs: 1.4 10*3/uL (ref 0.7–4.0)
MCHC: 33.2 g/dL (ref 30.0–36.0)
MCV: 85 fl (ref 78.0–100.0)
Monocytes Absolute: 0.5 10*3/uL (ref 0.1–1.0)
Monocytes Relative: 10.4 % (ref 3.0–12.0)
Neutro Abs: 3 10*3/uL (ref 1.4–7.7)
Neutrophils Relative %: 59 % (ref 43.0–77.0)
Platelets: 243 10*3/uL (ref 150.0–400.0)
RBC: 4.54 Mil/uL (ref 3.87–5.11)
RDW: 17.8 % — ABNORMAL HIGH (ref 11.5–15.5)
WBC: 5 10*3/uL (ref 4.0–10.5)

## 2018-01-08 LAB — IRON: Iron: 73 ug/dL (ref 42–145)

## 2018-01-08 LAB — FERRITIN: Ferritin: 22.3 ng/mL (ref 10.0–291.0)

## 2018-01-08 MED ORDER — OMEPRAZOLE 20 MG PO CPDR
20.0000 mg | DELAYED_RELEASE_CAPSULE | Freq: Every day | ORAL | 1 refills | Status: DC
Start: 2018-01-08 — End: 2018-08-15

## 2018-01-08 MED ORDER — ALPRAZOLAM 0.25 MG PO TABS: 0.1250 mg | ORAL_TABLET | Freq: Two times a day (BID) | ORAL | 1 refills | Status: DC | PRN

## 2018-01-08 MED ORDER — ESCITALOPRAM OXALATE 20 MG PO TABS: 20.0000 mg | ORAL_TABLET | Freq: Every day | ORAL | 2 refills | Status: DC

## 2018-01-08 NOTE — Assessment & Plan Note (Signed)
Symptoms worse in R>L. Has been seen by Ortho, Sports med, chiropractor and now neurosurgery after an MRI showed significant pathology. Was seen by Dr Vertell Limber and they have agreed to conservative management and she is going to start water aerobics at his suggestion. Pain is manageable at this time

## 2018-01-08 NOTE — Assessment & Plan Note (Signed)
Is following with neurology and is managing her symptoms well

## 2018-01-08 NOTE — Patient Instructions (Signed)
Rheumatoid Arthritis Rheumatoid arthritis (RA) is a long-term (chronic) disease. RA causes inflammation in your joints. Your joints may feel painful, stiff, swollen, warm, or tender. RA may start slowly. Usually, it affects the small joints of the hands and feet. It can also affect other parts of the body, even the heart, eyes, or lungs. Symptoms of RA often come and go. Sometimes, symptoms get worse for a while. These are called flares. There is no cure for RA, but your doctor will work with you to find the best treatment option for you. This will depend on how the disease is changing in your body. Follow these instructions at home:  Take over-the-counter and prescription medicines only as told by your doctor. Your doctor may change (adjust) your medicines every 3 months.  Start an exercise program as told by your doctor.  Rest when you have a flare.  Return to your normal activities as told by your doctor. Ask your doctor what activities are safe for you.  Keep all follow-up visits as told by your doctor. This is important. Contact a doctor if:  You have a flare.  You have a fever.  You have problems (side effects) because of your medicines. Get help right away if:  You have chest pain.  You have trouble breathing.  You have a hot, painful joint all of a sudden, and it is worse than your usual joint aches. This information is not intended to replace advice given to you by your health care provider. Make sure you discuss any questions you have with your health care provider. Document Released: 11/13/2011 Document Revised: 01/27/2016 Document Reviewed: 06/03/2015 Elsevier Interactive Patient Education  2018 Elsevier Inc.  

## 2018-01-08 NOTE — Assessment & Plan Note (Signed)
Tolerating statin, encouraged heart healthy diet, avoid trans fats, minimize simple carbs and saturated fats. Increase exercise as tolerated 

## 2018-01-08 NOTE — Assessment & Plan Note (Signed)
Is following with [facility], has been started on Plaquenil and will follow up with them.

## 2018-01-08 NOTE — Assessment & Plan Note (Signed)
Encouraged DASH diet, decrease po intake and increase exercise as tolerated. Needs 7-8 hours of sleep nightly. Avoid trans fats, eat small, frequent meals every 4-5 hours with lean proteins, complex carbs and healthy fats. Minimize simple carbs 

## 2018-01-08 NOTE — Progress Notes (Signed)
Subjective:  I acted as a Education administrator for Dr. Charlett Blake. [place], [place]  Patient ID: Kristin [name], female    DOB: 14-Sep-1950, 67 y.o.   MRN: [id number]  No chief complaint on file.   HPI  Patient is in today for a 3 month follow up she has made her peace with the diagnosis of Rheumatoid Arthritis and she is managing her depression regarding her current state of health now. She is working with rheumatology to manage her pain and has just started to exercise again. She has been seen by neurosurgery, Dr Vertell Limber and after her review of her MRI with him she has been released to try water aerobics and she is committed to doing so. No recent febrile illness or hospitlaizations. Notes some anhedonia but no suicidal ideation. Denies CP/palp/SOB/HA/congestion/fevers/GI or GU c/o. Taking meds as prescribed  Patient Care Team: Mosie Lukes, MD as PCP - General (Family Medicine) Tat, Eustace Quail, DO as Consulting Physician (Neurology)   Past Medical History:  Diagnosis Date  . Abnormal vaginal Pap smear   . Acute pharyngitis 02/25/2013  . Anemia   . Anxiety   . Arthritis   . 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 Chicken pox as a child  . Chronic UTI    sees dr Terance Hart  . Constipation 12/07/2015  . Depression with anxiety 11/02/2009   Qualifier: Diagnosis of  By: [name], LPN, [name]   . Dermatitis 07/17/2012  . Esophageal stricture 1994  . Fibroids   . Foot pain, bilateral 06/19/2012  . GERD (gastroesophageal reflux disease)   . GERD (gastroesophageal reflux disease)   . Hiatal hernia   . Hyperglycemia 08/19/2013  . 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 Hyperlipidemia   . Hypertension   . Infertility, female   . Low back pain 10/17/2007   Qualifier: Diagnosis of  By: [name], [name]   . Measles as a child  . Obesity   . Osteoarthritis   . Parkinson disease ([place])   . Plantar fasciitis of left foot 06/19/2012  . Preventative health care 12/19/2015  . Rheumatoid arthritis  ([place]) 01/08/2018  . Rosacea 10/05/2014  . Swallowing difficulty   . Urinary frequency 02/25/2013  . Visual floaters 05/11/2014    Past Surgical History:  Procedure Laterality Date  . ABDOMINAL HYSTERECTOMY  2006   total  . esophageal     stretching  . laporoscopy    . PARTIAL HIP ARTHROPLASTY  2010   right  . TONSILLECTOMY    . wisdom teeth extracted      Family History  Problem Relation Age of Onset  . Cancer Mother        breast  . Other Mother        arrythmia  . Mental illness Mother        bipolar  . Hyperlipidemia Mother   . Thyroid disease Mother   . Depression Mother   . Bipolar disorder Mother   . Heart disease Father   . Arthritis Father        rheumatoid  . Hypertension Father   . Hyperlipidemia Father   . Depression Sister   . Mental illness Sister        bipolar  . Parkinson's disease Sister   . Arthritis Sister   . Arthritis Sister   . Arthritis Sister   . Colon cancer Neg Hx   . Esophageal cancer Neg Hx   . Rectal cancer Neg Hx   .  Stomach cancer Neg Hx     Social History   Socioeconomic History  . Marital status: Married    Spouse name: Not on file  . Number of children: Not on file  . Years of education: Not on file  . Highest education level: Not on file  Occupational History  . Occupation: Retired Teacher, music  Social Needs  . Financial resource strain: Not on file  . Food insecurity:    Worry: Not on file    Inability: Not on file  . Transportation needs:    Medical: Not on file    Non-medical: Not on file  Tobacco Use  . Smoking status: Never Smoker  . Smokeless tobacco: Never Used  Substance and Sexual Activity  . Alcohol use: Yes    Alcohol/week: 2.4 oz    Types: 4 Glasses of wine per week    Comment: 2 glasses of wine weekly  . Drug use: No  . Sexual activity: Not Currently    Partners: Male  Lifestyle  . Physical activity:    Days per week: Not on file    Minutes per session: Not on file  . Stress: Not on file    Relationships  . Social connections:    Talks on phone: Not on file    Gets together: Not on file    Attends religious service: Not on file    Active member of club or organization: Not on file    Attends meetings of clubs or organizations: Not on file    Relationship status: Not on file  . Intimate partner violence:    Fear of current or ex partner: Not on file    Emotionally abused: Not on file    Physically abused: Not on file    Forced sexual activity: Not on file  Other Topics Concern  . Not on file  Social History Narrative   Lives with husband, no major dietary restrictions, retired from teaching      Husband dx with cancer MDS June 2017.   Currently in remission. (11/03/16 pc)       Outpatient Medications Prior to Visit  Medication Sig Dispense Refill  . carbidopa-levodopa (SINEMET CR) 50-200 MG tablet TAKE 1 TABLET BY MOUTH AT BEDTIME 90 tablet 1  . carbidopa-levodopa (SINEMET IR) 25-100 MG tablet TAKE 1 TABLET BY MOUTH 3 TIMES DAILY 90 tablet 5  . Cholecalciferol (VITAMIN D) 2000 units CAPS Take by mouth.    . diclofenac sodium (VOLTAREN) 1 % GEL Apply 1 g topically as needed.  1  . DULoxetine (CYMBALTA) 30 MG capsule TAKE 1 CAPSULE BY MOUTH ONCE DAILY 90 capsule 0  . ferrous fumarate (HEMOCYTE - 106 MG FE) 325 (106 Fe) MG TABS tablet Take 1 tablet (106 mg of iron total) by mouth daily. 90 each 1  . hydroxychloroquine (PLAQUENIL) 200 MG tablet Take 2 tablets by mouth daily.  2  . polyethylene glycol powder (GLYCOLAX/MIRALAX) powder Take 17 g by mouth 2 (two) times daily as needed (1 to 2 times per day, as needed). 850 g 0  . pramipexole (MIRAPEX) 0.5 MG tablet TAKE 2 TABLETS BY MOUTH IN THE MORNING AND 1 TABLET IN THE AFTERNOON AND 1 TABLET IN THE EVENING 360 tablet 1  . rosuvastatin (CRESTOR) 10 MG tablet TAKE 1 TABLET BY MOUTH ON MONDAYS, WEDNESDAYS AND FRIDAYS 54 tablet 1  . telmisartan (MICARDIS) 80 MG tablet TAKE 1 TABLET BY MOUTH EVERY DAY 90 tablet 1  . ALPRAZolam  (XANAX) 0.25  MG tablet Take 0.5-1 tablets (0.125-0.25 mg total) by mouth 2 (two) times daily as needed for anxiety. 30 tablet 1  . DULoxetine (CYMBALTA) 60 MG capsule Take 1 capsule (60 mg total) by mouth daily. 30 capsule 3  . escitalopram (LEXAPRO) 10 MG tablet Take 1 tablet (10 mg total) by mouth at bedtime. 30 tablet 3  . escitalopram (LEXAPRO) 20 MG tablet Take 1 tablet by mouth daily.  2  . omeprazole (PRILOSEC) 20 MG capsule TAKE 1 CAPSULE BY MOUTH ONCE DAILY 90 capsule 0   No facility-administered medications prior to visit.     Allergies  Allergen Reactions  . Bactrim [Sulfamethoxazole-Trimethoprim] Other (See Comments)    Oral ulcers and rash  . Penicillins Hives    Review of Systems  Constitutional: Negative for fever and malaise/fatigue.  HENT: Negative for congestion.   Eyes: Negative for blurred vision.  Respiratory: Negative for shortness of breath.   Cardiovascular: Negative for chest pain and palpitations.  Gastrointestinal: Negative for abdominal pain, blood in stool and nausea.  Genitourinary: Negative for dysuria and frequency.  Musculoskeletal: Positive for back pain, joint pain and myalgias. Negative for falls.  Skin: Negative for rash.  Neurological: Positive for tremors. Negative for dizziness, loss of consciousness and headaches.  Endo/Heme/Allergies: Negative for environmental allergies.  Psychiatric/Behavioral: Negative for depression. The patient is not nervous/anxious.        Objective:    Physical Exam  Constitutional: She is oriented to person, place, and time. She appears well-developed and well-nourished. No distress.  HENT:  Head: Normocephalic and atraumatic.  Nose: Nose normal.  Eyes: Right eye exhibits no discharge. Left eye exhibits no discharge.  Neck: Normal range of motion. Neck supple.  Cardiovascular: Normal rate and regular rhythm.  No murmur heard. Pulmonary/Chest: Effort normal and breath sounds normal.  Abdominal: Soft. Bowel  sounds are normal. There is no tenderness.  Musculoskeletal: She exhibits no edema.  Neurological: She is alert and oriented to person, place, and time.  Resting tremor  Skin: Skin is warm and dry.  Psychiatric: She has a normal mood and affect.  Nursing note and vitals reviewed.   BP 100/68 (BP Location: Right Arm, Patient Position: Sitting, Cuff Size: Normal)   Pulse 79   Temp 98.3 F (36.8 C) (Oral)   Resp 18   Wt 250 lb 9.6 oz (113.7 kg)   SpO2 99%   BMI 43.02 kg/m  Wt Readings from Last 3 Encounters:  01/08/18 250 lb 9.6 oz (113.7 kg)  10/23/17 251 lb (113.9 kg)  10/15/17 247 lb 12.8 oz (112.4 kg)   BP Readings from Last 3 Encounters:  01/08/18 100/68  10/23/17 126/82  10/15/17 128/85     Immunization History  Administered Date(s) Administered  . Influenza Split 06/19/2012  . Influenza Whole 07/05/2007, 07/08/2009  . Influenza, High Dose Seasonal PF 05/04/2016, 05/10/2017  . Influenza,inj,Quad PF,6+ Mos 07/10/2013, 05/05/2014  . Pneumococcal Conjugate-13 07/10/2013  . Pneumococcal Polysaccharide-23 12/07/2015  . Td 09/04/2004  . Tdap 01/04/2015  . Zoster Recombinat (Shingrix) 12/20/2017    Health Maintenance  Topic Date Due  . MAMMOGRAM  11/25/2017  . INFLUENZA VACCINE  04/04/2018  . COLONOSCOPY  10/03/2022  . TETANUS/TDAP  01/03/2025  . DEXA SCAN  Completed  . Hepatitis C Screening  Completed  . PNA vac Low Risk Adult  Completed    Lab Results  Component Value Date   WBC 5.0 01/08/2018   HGB 12.8 01/08/2018   HCT 38.6 01/08/2018   PLT  243.0 01/08/2018   GLUCOSE 107 (H) 11/16/2017   CHOL 152 11/16/2017   TRIG 94.0 11/16/2017   HDL 55.30 11/16/2017   LDLDIRECT 161.5 10/07/2008   LDLCALC 78 11/16/2017   ALT 5 11/16/2017   AST 18 11/16/2017   NA 141 11/16/2017   K 4.3 11/16/2017   CL 105 11/16/2017   CREATININE 0.79 11/16/2017   BUN 25 (H) 11/16/2017   CO2 30 11/16/2017   TSH 1.70 11/16/2017   INR 2.5 (H) 01/19/2009   HGBA1C 5.8 11/16/2017      Lab Results  Component Value Date   TSH 1.70 11/16/2017   Lab Results  Component Value Date   WBC 5.0 01/08/2018   HGB 12.8 01/08/2018   HCT 38.6 01/08/2018   MCV 85.0 01/08/2018   PLT 243.0 01/08/2018   Lab Results  Component Value Date   NA 141 11/16/2017   K 4.3 11/16/2017   CO2 30 11/16/2017   GLUCOSE 107 (H) 11/16/2017   BUN 25 (H) 11/16/2017   CREATININE 0.79 11/16/2017   BILITOT 0.5 11/16/2017   ALKPHOS 69 11/16/2017   AST 18 11/16/2017   ALT 5 11/16/2017   PROT 6.5 11/16/2017   ALBUMIN 3.9 11/16/2017   CALCIUM 9.4 11/16/2017   GFR 77.03 11/16/2017   Lab Results  Component Value Date   CHOL 152 11/16/2017   Lab Results  Component Value Date   HDL 55.30 11/16/2017   Lab Results  Component Value Date   LDLCALC 78 11/16/2017   Lab Results  Component Value Date   TRIG 94.0 11/16/2017   Lab Results  Component Value Date   CHOLHDL 3 11/16/2017   Lab Results  Component Value Date   HGBA1C 5.8 11/16/2017         Assessment & Plan:   Problem List Items Addressed This Visit    Hyperlipidemia    Tolerating statin, encouraged heart healthy diet, avoid trans fats, minimize simple carbs and saturated fats. Increase exercise as tolerated      Obesity    Encouraged DASH diet, decrease po intake and increase exercise as tolerated. Needs 7-8 hours of sleep nightly. Avoid trans fats, eat small, frequent meals every 4-5 hours with lean proteins, complex carbs and healthy fats. Minimize simple carbs      Depression with anxiety    Was worsening with her crying and anhedonia. Her best combo Cymbalta 30 amd Lexapro 20 daily so this is where she will stay for now      Relevant Medications   ALPRAZolam (XANAX) 0.25 MG tablet   escitalopram (LEXAPRO) 20 MG tablet   Essential hypertension    Well controlled, no changes to meds. Encouraged heart healthy diet such as the DASH diet and exercise as tolerated.       GERD   Relevant Medications    omeprazole (PRILOSEC) 20 MG capsule   Lumbar back pain with radiculopathy affecting lower extremity    Symptoms worse in R>L. Has been seen by Ortho, Sports med, chiropractor and now neurosurgery after an MRI showed significant pathology. Was seen by Dr Vertell Limber and they have agreed to conservative management and she is going to start water aerobics at his suggestion. Pain is manageable at this time      Relevant Medications   ALPRAZolam (XANAX) 0.25 MG tablet   escitalopram (LEXAPRO) 20 MG tablet   Parkinson's disease ([place])    Is following with neurology and is managing her symptoms well      Vitamin D  deficiency    Taking vitamin d daily 2000 IU      Rheumatoid arthritis ([place])    Is following with [facility], has been started on Plaquenil and will follow up with them.       Relevant Medications   hydroxychloroquine (PLAQUENIL) 200 MG tablet    Other Visit Diagnoses    High risk medication use    -  Primary   Relevant Orders   Pain Mgmt, Profile 8 w/Conf, U   Anemia, unspecified type       Relevant Orders   CBC w/Diff (Completed)   Ferritin (Completed)   Iron (Completed)      I have discontinued [name]. Betzler's escitalopram. I have also changed her escitalopram and omeprazole. Additionally, I am having her maintain her polyethylene glycol powder, Vitamin D, telmisartan, carbidopa-levodopa, pramipexole, rosuvastatin, carbidopa-levodopa, DULoxetine, ferrous fumarate, diclofenac sodium, hydroxychloroquine, and ALPRAZolam.  Meds ordered this encounter  Medications  . ALPRAZolam (XANAX) 0.25 MG tablet    Sig: Take 0.5-1 tablets (0.125-0.25 mg total) by mouth 2 (two) times daily as needed for anxiety.    Dispense:  30 tablet    Refill:  1  . escitalopram (LEXAPRO) 20 MG tablet    Sig: Take 1 tablet (20 mg total) by mouth daily.    Dispense:  90 tablet    Refill:  2  . omeprazole (PRILOSEC) 20 MG capsule    Sig: Take 1 capsule (20 mg total) by mouth daily.     Dispense:  90 capsule    Refill:  1    CMA served as scribe during this visit. History, Physical and Plan performed by medical provider. Documentation and orders reviewed and attested to.  [name], MD

## 2018-01-08 NOTE — Assessment & Plan Note (Addendum)
Taking vitamin d daily 2000 IU

## 2018-01-08 NOTE — Assessment & Plan Note (Signed)
Well controlled, no changes to meds. Encouraged heart healthy diet such as the DASH diet and exercise as tolerated.  °

## 2018-01-08 NOTE — Assessment & Plan Note (Signed)
Was worsening with her crying and anhedonia. Her best combo Cymbalta 30 amd Lexapro 20 daily so this is where she will stay for now

## 2018-01-11 LAB — PAIN MGMT, PROFILE 8 W/CONF, U
6 Acetylmorphine: NEGATIVE ng/mL (ref <10)
Alcohol Metabolites: NEGATIVE ng/mL (ref <500)
Alphahydroxyalprazolam: NEGATIVE ng/mL (ref <25)
Alphahydroxymidazolam: NEGATIVE ng/mL (ref <50)
Alphahydroxytriazolam: NEGATIVE ng/mL (ref <50)
Aminoclonazepam: NEGATIVE ng/mL (ref <25)
Amphetamines: NEGATIVE ng/mL (ref <500)
Benzodiazepines: NEGATIVE ng/mL (ref <100)
Buprenorphine, Urine: NEGATIVE ng/mL (ref <5)
Cocaine Metabolite: NEGATIVE ng/mL (ref <150)
Creatinine: 120.2 mg/dL
Hydroxyethylflurazepam: NEGATIVE ng/mL (ref <50)
Lorazepam: NEGATIVE ng/mL (ref <50)
MDMA: NEGATIVE ng/mL (ref <500)
Marijuana Metabolite: NEGATIVE ng/mL (ref <20)
Nordiazepam: NEGATIVE ng/mL (ref <50)
Opiates: NEGATIVE ng/mL (ref <100)
Oxazepam: NEGATIVE ng/mL (ref <50)
Oxidant: NEGATIVE ug/mL (ref <200)
Oxycodone: NEGATIVE ng/mL (ref <100)
Temazepam: NEGATIVE ng/mL (ref <50)
pH: 6.7 (ref 4.5–9.0)

## 2018-01-18 DIAGNOSIS — M545 Low back pain: Secondary | ICD-10-CM | POA: Diagnosis not present

## 2018-01-18 DIAGNOSIS — M79604 Pain in right leg: Secondary | ICD-10-CM | POA: Diagnosis not present

## 2018-01-18 DIAGNOSIS — R2689 Other abnormalities of gait and mobility: Secondary | ICD-10-CM | POA: Diagnosis not present

## 2018-01-21 DIAGNOSIS — F331 Major depressive disorder, recurrent, moderate: Secondary | ICD-10-CM | POA: Diagnosis not present

## 2018-01-21 DIAGNOSIS — R2689 Other abnormalities of gait and mobility: Secondary | ICD-10-CM | POA: Diagnosis not present

## 2018-01-21 DIAGNOSIS — M79604 Pain in right leg: Secondary | ICD-10-CM | POA: Diagnosis not present

## 2018-01-21 DIAGNOSIS — M545 Low back pain: Secondary | ICD-10-CM | POA: Diagnosis not present

## 2018-01-24 DIAGNOSIS — M79604 Pain in right leg: Secondary | ICD-10-CM | POA: Diagnosis not present

## 2018-01-24 DIAGNOSIS — R2689 Other abnormalities of gait and mobility: Secondary | ICD-10-CM | POA: Diagnosis not present

## 2018-01-24 DIAGNOSIS — M545 Low back pain: Secondary | ICD-10-CM | POA: Diagnosis not present

## 2018-02-01 DIAGNOSIS — R2689 Other abnormalities of gait and mobility: Secondary | ICD-10-CM | POA: Diagnosis not present

## 2018-02-01 DIAGNOSIS — M545 Low back pain: Secondary | ICD-10-CM | POA: Diagnosis not present

## 2018-02-01 DIAGNOSIS — M79604 Pain in right leg: Secondary | ICD-10-CM | POA: Diagnosis not present

## 2018-02-04 DIAGNOSIS — M545 Low back pain: Secondary | ICD-10-CM | POA: Diagnosis not present

## 2018-02-04 DIAGNOSIS — R2689 Other abnormalities of gait and mobility: Secondary | ICD-10-CM | POA: Diagnosis not present

## 2018-02-04 DIAGNOSIS — M79604 Pain in right leg: Secondary | ICD-10-CM | POA: Diagnosis not present

## 2018-02-07 DIAGNOSIS — F331 Major depressive disorder, recurrent, moderate: Secondary | ICD-10-CM | POA: Diagnosis not present

## 2018-02-07 DIAGNOSIS — M79604 Pain in right leg: Secondary | ICD-10-CM | POA: Diagnosis not present

## 2018-02-07 DIAGNOSIS — R2689 Other abnormalities of gait and mobility: Secondary | ICD-10-CM | POA: Diagnosis not present

## 2018-02-07 DIAGNOSIS — M545 Low back pain: Secondary | ICD-10-CM | POA: Diagnosis not present

## 2018-02-14 ENCOUNTER — Other Ambulatory Visit: Payer: Self-pay

## 2018-02-14 NOTE — Telephone Encounter (Signed)
Received refill request on patients cymbalta and lexapro. Refills were just sent in on 01/08/18 to pharmacy.

## 2018-02-18 DIAGNOSIS — R2689 Other abnormalities of gait and mobility: Secondary | ICD-10-CM | POA: Diagnosis not present

## 2018-02-18 DIAGNOSIS — M545 Low back pain: Secondary | ICD-10-CM | POA: Diagnosis not present

## 2018-02-18 DIAGNOSIS — M79604 Pain in right leg: Secondary | ICD-10-CM | POA: Diagnosis not present

## 2018-02-18 DIAGNOSIS — F331 Major depressive disorder, recurrent, moderate: Secondary | ICD-10-CM | POA: Diagnosis not present

## 2018-02-25 DIAGNOSIS — M79604 Pain in right leg: Secondary | ICD-10-CM | POA: Diagnosis not present

## 2018-02-25 DIAGNOSIS — M545 Low back pain: Secondary | ICD-10-CM | POA: Diagnosis not present

## 2018-02-25 DIAGNOSIS — R2689 Other abnormalities of gait and mobility: Secondary | ICD-10-CM | POA: Diagnosis not present

## 2018-02-27 ENCOUNTER — Other Ambulatory Visit: Payer: Self-pay

## 2018-02-27 MED ORDER — DULOXETINE HCL 30 MG PO CPEP: 30.0000 mg | ORAL_CAPSULE | Freq: Every day | ORAL | 0 refills | Status: DC

## 2018-02-28 DIAGNOSIS — M545 Low back pain: Secondary | ICD-10-CM | POA: Diagnosis not present

## 2018-02-28 DIAGNOSIS — R2689 Other abnormalities of gait and mobility: Secondary | ICD-10-CM | POA: Diagnosis not present

## 2018-02-28 DIAGNOSIS — M79604 Pain in right leg: Secondary | ICD-10-CM | POA: Diagnosis not present

## 2018-03-04 DIAGNOSIS — M545 Low back pain: Secondary | ICD-10-CM | POA: Diagnosis not present

## 2018-03-04 DIAGNOSIS — M79604 Pain in right leg: Secondary | ICD-10-CM | POA: Diagnosis not present

## 2018-03-04 DIAGNOSIS — F331 Major depressive disorder, recurrent, moderate: Secondary | ICD-10-CM | POA: Diagnosis not present

## 2018-03-04 DIAGNOSIS — R2689 Other abnormalities of gait and mobility: Secondary | ICD-10-CM | POA: Diagnosis not present

## 2018-03-11 DIAGNOSIS — G20 Parkinson's disease: Secondary | ICD-10-CM | POA: Diagnosis not present

## 2018-03-11 DIAGNOSIS — M48061 Spinal stenosis, lumbar region without neurogenic claudication: Secondary | ICD-10-CM | POA: Diagnosis not present

## 2018-03-11 DIAGNOSIS — I10 Essential (primary) hypertension: Secondary | ICD-10-CM | POA: Diagnosis not present

## 2018-03-11 DIAGNOSIS — F331 Major depressive disorder, recurrent, moderate: Secondary | ICD-10-CM | POA: Diagnosis not present

## 2018-03-11 DIAGNOSIS — M545 Low back pain: Secondary | ICD-10-CM | POA: Diagnosis not present

## 2018-03-11 DIAGNOSIS — Z6841 Body Mass Index (BMI) 40.0 and over, adult: Secondary | ICD-10-CM | POA: Diagnosis not present

## 2018-03-11 DIAGNOSIS — M5126 Other intervertebral disc displacement, lumbar region: Secondary | ICD-10-CM | POA: Diagnosis not present

## 2018-03-11 DIAGNOSIS — M5416 Radiculopathy, lumbar region: Secondary | ICD-10-CM | POA: Diagnosis not present

## 2018-03-15 ENCOUNTER — Ambulatory Visit: Payer: Medicare Other | Admitting: Medical

## 2018-03-19 DIAGNOSIS — M545 Low back pain: Secondary | ICD-10-CM | POA: Diagnosis not present

## 2018-03-19 DIAGNOSIS — M79604 Pain in right leg: Secondary | ICD-10-CM | POA: Diagnosis not present

## 2018-03-19 DIAGNOSIS — R2689 Other abnormalities of gait and mobility: Secondary | ICD-10-CM | POA: Diagnosis not present

## 2018-03-21 ENCOUNTER — Ambulatory Visit
Admission: RE | Admit: 2018-03-21 | Discharge: 2018-03-21 | Disposition: A | Discharge status: Home / Self Care | Payer: Medicare Other | Source: Ambulatory Visit | Attending: Family Medicine | Admitting: Family Medicine

## 2018-03-21 ENCOUNTER — Encounter: Payer: Self-pay | Admitting: Family Medicine

## 2018-03-21 DIAGNOSIS — Z1231 Encounter for screening mammogram for malignant neoplasm of breast: Secondary | ICD-10-CM | POA: Diagnosis not present

## 2018-03-21 DIAGNOSIS — M150 Primary generalized (osteo)arthritis: Secondary | ICD-10-CM | POA: Diagnosis not present

## 2018-03-21 DIAGNOSIS — M0579 Rheumatoid arthritis with rheumatoid factor of multiple sites without organ or systems involvement: Secondary | ICD-10-CM | POA: Diagnosis not present

## 2018-03-21 DIAGNOSIS — G20 Parkinson's disease: Secondary | ICD-10-CM | POA: Diagnosis not present

## 2018-03-21 DIAGNOSIS — Z8261 Family history of arthritis: Secondary | ICD-10-CM | POA: Diagnosis not present

## 2018-03-21 DIAGNOSIS — Z1239 Encounter for other screening for malignant neoplasm of breast: Secondary | ICD-10-CM

## 2018-03-21 DIAGNOSIS — Z6841 Body Mass Index (BMI) 40.0 and over, adult: Secondary | ICD-10-CM | POA: Diagnosis not present

## 2018-03-21 DIAGNOSIS — R768 Other specified abnormal immunological findings in serum: Secondary | ICD-10-CM | POA: Diagnosis not present

## 2018-03-21 DIAGNOSIS — R5383 Other fatigue: Secondary | ICD-10-CM | POA: Diagnosis not present

## 2018-03-21 DIAGNOSIS — M2550 Pain in unspecified joint: Secondary | ICD-10-CM | POA: Diagnosis not present

## 2018-03-21 DIAGNOSIS — M7989 Other specified soft tissue disorders: Secondary | ICD-10-CM | POA: Diagnosis not present

## 2018-03-22 NOTE — Progress Notes (Signed)
Subjective:    Kristin [name] was seen in consultation in the movement disorder clinic at the request of Mosie Lukes, MD.  The evaluation is for tremor.  The patient is a 67 y.o. left handed female with a history of tremor.  The records that were made available to me were reviewed.  According to records, the patient has complained about tremor to her previous primary care physician all the way back to 2012, but in those records she had said that tremor had been going on for years.  She states today, however, that she really thinks that it started in 2012.  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  R hand was always worse even though she is L hand dominant.  It has slowly progressed.  She reports that tremor is in both hands now (R still worse) but she feels tremor on the inside of the body all of the time.  She has tremor at rest now.  She feels nervous now and is unsure if it was related.  She also has the sweats and so she was placed on xanax but that doesn't help the sweating.  She recently went to an exercise class and noted that she wasn't as coordinated/strong with the R hand and that worried her and that is why she wanted a further evaluation.    She did see [name] in June, 2013 and I reviewed that note.  She was diagnosed with essential tremor.  No treatment was recommended.  There is  family hx of tremor in her father.  She states that she has been on metoprolol since 2012 and didn't think that it helped but when she tried to get off of it a year ago, the tremor got worse and she couldn't get off of it.    03/09/14 update:  The patient presents today for follow up.  Today, she is accompanied by her husband who helps to supplement the history.  He was not present on the prior visit.  She was diagnosed with PD last visit.  I started her on Mirapex.  She has been doing better in terms of tremor.  Interestingly, sweats and sleep have been better.  She  just retired last week.  No side effects with the Mirapex.  She is currently taking it at 9 AM/2 PM/9 PM. No compulsive behaviors.  No sleep attacks.  She has been attending therapy.  She is planning on starting the Parkinson's exercise class.  She has been educating herself and reading patient education material.  She had a modified barium evaluation on 02/25/2014.  It was normal, although they suspected that she had mild esophageal dysphagia.  Her husband asks multiple questions today and asked about potentially having another opinion.  The patient has had no falls.  No hallucinations.  07/15/14 update:  Pt is f/u today, accompanied by her husband who supplements the history.  Pt is on mirapex 0.5 mg three times per day.  I stopped her metoprolol last visit. She did fine with that. She is exercising with the Moves class and with circuit II class.  She is also enrolled in the bike class at the [facility].  She is having some soreness because of the exercise and hip pain because of it and she has had a hip replacement and worries about that as she doesn't want to have another.  Thinking about trying chiropractics for that.  She asks me about some.  Stages that she has on the lateral aspect of the knees.  She does not wear boots.  She has not had a knee replacement.  She also complains of some achiness in her toes and it gets better as she walks.  No falls.  No lightheadedness.    11/11/14 update:  The patient returns today for follow-up.  She is on Mirapex, 0.5 mg 3 times per day.  Since our last visit, the patient did go to [facility] for a second opinion.  She saw Dr. [name].  This was just 2 days ago.  No notes are available.  They state that they had a good visit, are glad that they went and got a second opinion and no new recommendations/treatments were given.  She has been doing well at home.  She is very active in our community exercise programs for PD.  She is enrolled in the [facility] exercise program and in our  circuit class and is walking on the treadmill.  She notices tremor in public but generally not as much at home.  She notes wearing off of the medication but only when time for the next dosage.    02/09/15 update:  The patient is following up today regarding her Parkinson's disease.  She is accompanied by her husband who supplements the history.  She did get a second opinion at [place] with Dr. [name] and his fellow, and [name], on 11/17/2014.  I reviewed those notes.  I told her that she could increase the Mirapex if needed to control tremor further.  She is currently on Mirapex 0.5 mg 3 times per day (7am/1:30 pm/7:00).  She is on Lexapro and Cymbalta for depression.  She is doing exercise.   She went to circuit class for PD today and she will have tremor when doing that.  She notes that when doing exercise, she has more tremor.  No falls.  She went to [place] for 2 weeks with a tour group and she felt that she was able to keep up with the group.  She felt better on this trip than she did 2 years ago on a trip.  05/12/15 update:  The patient falls up today regarding her Parkinson's disease. She is accompanied by her husband who supplements the history.   Last visit, I had her increase her Mirapex to 1 mg in the morning and she remained on 0.5 mg in the afternoon and 0.5 mg in the evening. She was initially tired with the increase in the medication but has adjusted now.  Her daughter and 8 month old grandson are living with them.  She is exercising on the treadmill.  She is doing the Parkinsons exercise class.  She is going to try the boxing class and is in the [employer] class and it is increasing to 2 days a week. She has been doing well with mood on a combination of Lexapro and Cymbalta.  She denies any falls.  No lightheadedness or near syncope.  No hallucinations.  08/12/15 update:  The patient follows up today, accompanied by her husband who supplements the history.  The patient is on pramipexole, 1.0 mg in the  morning, 0.5 mg in the afternoon and 0.5 mg in the evening.  She is also planning on attending the [facility] retreat as a patient, which should be very good for her.  She remains on Lexapro and Cymbalta.  They're working well for her.  She is thinking about getting a therapist and asks me about  that.  She also asks me about possible supplements for Parkinson's disease.  She continues to exercise a lot.  She has not had any falls.  No dyskinesia.  No lightheadedness or near syncope.  12/01/15 update:  The patient follows up today, accompanied by her husband who supplements the history.  The patient is on pramipexole, 1.0 mg in the morning, 0.5 mg in the afternoon and 0.5 mg in the evening.  She is also planning on attending the [facility] retreat as a patient, which should be very good for her.  Its in [place].  She remains on Lexapro and Cymbalta.  They're working well for her.  She continues to exercise a lot.  Had not been pole walking and just started and noted that her arms are very sore.  Still knows that she needs a therapist and asked about it last time and gave her Valeda Malm name but she hasn't done it.  She has not had any falls.  No dyskinesia.  No lightheadedness or near syncope.  She just saw Dr. [name] on 11/08/15 and I reviewed those notes.  No changes were made.    03/14/16 update:  The patient follows up today for her PD.  The patient is on pramipexole, 1.0 mg in the morning, 0.5 mg in the afternoon and 0.5 mg in the evening.  She attended the [facility] retreat in [place] as a patient.  She went at the end of May.  States that it was very challenging.   She remains on Lexapro and Cymbalta.  They're working well for her.  She continues to exercise a lot.  She has not had any falls.  No dyskinesia.  No lightheadedness or near syncope.  Noting some turning in of the L ankle. Not sure if related to timing of medication.   States that husband was dx with MDS since our last visit.  Because of that, she has not she has  not been able to attend PD therapy locally because she has been running back and forth to [place] with him.  Sister with dx with PD since our last visit.    10/31/16 update:  Patient follows up today.  She is on pramipexole, 1 mg in the morning, 0.5 mg in the afternoon and evening.  Last visit, we initiated levodopa and she worked her way to 1 tablet 3 times per day.  She has not had side effects.  No dyskinesia.  No falls.  Is having some dizziness or near syncope.  She admits that she is not hydrating well.  She is having more choking on water/liquid/saliva.  She is  planning to attend the [facility] retreat again in [place].  She has had a fairly rough time since our last visit, as she was in [place] with several months with her husband who had been awaiting a bone marrow transplant for leukemia.  He had this 06/2016.  He still won't leave the house because he is scared of germs but he has been given the clearance to do so.  She did join a gym and was exercising while she was in [place].  Pt is going to counseling, [employer] but her husband has been resistant to do so.  Pt went to social work PD support group.  She is getting back to exercise here.  She has started back to spears biking and [facility] classes.  She feels a bit defeated about how hard the boxing class is.   03/05/17 update:  Patient seen today  in follow-up.  She is on pramipexole, 1 mg in the morning and 0.5 mg in the afternoon and evening.  She is still on carbidopa/levodopa 25/100, one tablet 3 times per day.  Some cramping of the 2nd toe at night bilaterally.  She has been to the [employer] and [place] again since our last visit and did well with that.  Her sister did go with her.  Pt denies falls.  Finished PT and doing OT now.  Pt denies lightheadedness, near syncope.  No hallucinations.  Mood has been good. Still seeing Trevor Mace, her counselor.  She has an adult autistic son that has a college degree but has trouble keeping a job and that has  been stressful for her. Is seeing Dr. Leafy Ro now regarding weight loss.  Having some joint pain.  Going to see knee doctor as well.  Lets feel "itchy" at night and she is scratching them at night and getting exoriations on them from scratching.  06/19/17 update:  Patient today in follow-up for her Parkinson's disease.  Patient is on carbidopa/levodopa 25/100, one tablet 3 times per day.  We added carbidopa/levodopa 50/200 at bedtime last visit to see if that would help the restless leg and cramping of the feet and toes at night.  She states that helped a lot.  She is also on pramipexole, 1 mg in the morning and 0.5 mg in the afternoon and evening.  She did come to our Parkinson's symposium in July, accompanied by her husband.  She was pleased that he came to the event.  She denies any falls since our last visit.  She continues to exercise faithfully.  She is having a hard time emotionally doing everything for her husband.  She is going to counseling.  shes getting tired so she gave up boxing.  She is going to spin class.  She is eating to cope with depression.  10/23/17 update: The patient is seen today in follow-up for Parkinson's disease.  This patient is accompanied in the office by her spouse who supplements the history.  She is on carbidopa/levodopa 25/100, 1 tablet 3 times per day and carbidopa/levodopa 50/200 at bedtime.  She is also on pramipexole, 1 mg in the morning and 0.5 mg in the afternoon and evening. Husband notes less tremor.  She does have some tremor in the legs.  Some trouble attending [facility] moves classes due to husbands appts at [facility].  Husband thinks improving and easier to get off of the floor.   She has had no sleep attacks.  She has had no compulsive behaviors.  Pt denies falls.  Pt denies lightheadedness, near syncope.  No hallucinations.  Mood has been fair but lexapro/cymbalta dosages just changed.  Pt is going back to retreat in May in [place] and sister is going with her.  Having more  pain in hands and feet.  Had positive ANA and 1:320 and going to see Dr. Marijean Bravo.  RF was positive.  Enrolled in life coach program for PD and patient likes it.  03/26/18 update: Patient is seen today in follow-up for Parkinson's disease.  She is on carbidopa/levodopa 25/100, 1 tablet 3 times per day and carbidopa/levodopa 50/200 at bedtime.  She is also on pramipexole 1 mg in the morning and 0.5 mg in the afternoon and evening.  She has had no falls since her last visit.  No lightheadedness or near syncope.  No sleep attacks.  No compulsive behaviors.  She is on both Lexapro and  Cymbalta for mood.  She has been working with primary care to manage this.  Records have been reviewed from primary care.  She saw Dr. Marijean Bravo since our last visit and was started on Plaquenil for rheumatoid arthritis.  She has an appt for her eye examination.  Besides this, she has had a stressful spring/summer.  Had back pain (better now) and water therapy helped.  Saw Dr Vertell Limber and told that didn't need surgery.  Friend had stroke.  Current/Previously tried tremor medications: metoprololol  Current medications that may exacerbate tremor:  Cymbalta, although I reviewed that the patient did try to change to Pristiq but the patient had nightmares with Pristiq and changed back to Cymbalta  Outside reports reviewed: historical medical records, lab reports and referral letter/letters.  Allergies  Allergen Reactions  . Bactrim [Sulfamethoxazole-Trimethoprim] Other (See Comments)    Oral ulcers and rash  . Penicillins Hives    Current Outpatient Medications on File Prior to Visit  Medication Sig Dispense Refill  . ALPRAZolam (XANAX) 0.25 MG tablet Take 0.5-1 tablets (0.125-0.25 mg total) by mouth 2 (two) times daily as needed for anxiety. 30 tablet 1  . carbidopa-levodopa (SINEMET CR) 50-200 MG tablet TAKE 1 TABLET BY MOUTH AT BEDTIME 90 tablet 1  . carbidopa-levodopa (SINEMET IR) 25-100 MG tablet TAKE 1 TABLET BY MOUTH 3  TIMES DAILY 90 tablet 5  . Cholecalciferol (VITAMIN D) 2000 units CAPS Take by mouth.    . diclofenac sodium (VOLTAREN) 1 % GEL Apply 1 g topically as needed.  1  . DULoxetine (CYMBALTA) 30 MG capsule Take 1 capsule (30 mg total) by mouth daily. 90 capsule 0  . escitalopram (LEXAPRO) 20 MG tablet Take 1 tablet (20 mg total) by mouth daily. 90 tablet 2  . ferrous fumarate (HEMOCYTE - 106 MG FE) 325 (106 Fe) MG TABS tablet Take 1 tablet (106 mg of iron total) by mouth daily. 90 each 1  . folic acid (FOLVITE) 1 MG tablet     . hydroxychloroquine (PLAQUENIL) 200 MG tablet Take 2 tablets by mouth daily.  2  . Methotrexate Sodium (METHOTREXATE, PF,) 50 MG/2ML injection     . omeprazole (PRILOSEC) 20 MG capsule Take 1 capsule (20 mg total) by mouth daily. 90 capsule 1  . polyethylene glycol powder (GLYCOLAX/MIRALAX) powder Take 17 g by mouth 2 (two) times daily as needed (1 to 2 times per day, as needed). 850 g 0  . pramipexole (MIRAPEX) 0.5 MG tablet TAKE 2 TABLETS BY MOUTH IN THE MORNING AND 1 TABLET IN THE AFTERNOON AND 1 TABLET IN THE EVENING 360 tablet 1  . predniSONE (DELTASONE) 5 MG tablet     . rosuvastatin (CRESTOR) 10 MG tablet TAKE 1 TABLET BY MOUTH ON MONDAYS, WEDNESDAYS AND FRIDAYS 54 tablet 1  . telmisartan (MICARDIS) 80 MG tablet TAKE 1 TABLET BY MOUTH EVERY DAY 90 tablet 1   No current facility-administered medications on file prior to visit.     Past Medical History:  Diagnosis Date  . Abnormal vaginal Pap smear   . Acute pharyngitis 02/25/2013  . Anemia   . Anxiety   . Arthritis   . 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 Chicken pox as a child  . Chronic UTI    sees dr Terance Hart  . Constipation 12/07/2015  . Depression with anxiety 11/02/2009   Qualifier: Diagnosis of  By: [name], LPN, [name]   . Dermatitis 07/17/2012  . Esophageal stricture 1994  .  Fibroids   . Foot pain, bilateral 06/19/2012  . GERD (gastroesophageal reflux disease)   . GERD  (gastroesophageal reflux disease)   . Hiatal hernia   . Hyperglycemia 08/19/2013  . 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 Hyperlipidemia   . Hypertension   . Infertility, female   . Low back pain 10/17/2007   Qualifier: Diagnosis of  By: [name], [name]   . Measles as a child  . Obesity   . Osteoarthritis   . Parkinson disease ([place])   . Plantar fasciitis of left foot 06/19/2012  . Preventative health care 12/19/2015  . Rheumatoid arthritis ([place]) 01/08/2018  . Rosacea 10/05/2014  . Swallowing difficulty   . Urinary frequency 02/25/2013  . Visual floaters 05/11/2014    Past Surgical History:  Procedure Laterality Date  . ABDOMINAL HYSTERECTOMY  2006   total  . esophageal     stretching  . laporoscopy    . PARTIAL HIP ARTHROPLASTY  2010   right  . TONSILLECTOMY    . wisdom teeth extracted      Social History   Socioeconomic History  . Marital status: Married    Spouse name: Not on file  . Number of children: Not on file  . Years of education: Not on file  . Highest education level: Not on file  Occupational History  . Occupation: Retired Teacher, music  Social Needs  . Financial resource strain: Not on file  . Food insecurity:    Worry: Not on file    Inability: Not on file  . Transportation needs:    Medical: Not on file    Non-medical: Not on file  Tobacco Use  . Smoking status: Never Smoker  . Smokeless tobacco: Never Used  Substance and Sexual Activity  . Alcohol use: Yes    Alcohol/week: 2.4 oz    Types: 4 Glasses of wine per week    Comment: 2 glasses of wine weekly  . Drug use: No  . Sexual activity: Not Currently    Partners: Male  Lifestyle  . Physical activity:    Days per week: Not on file    Minutes per session: Not on file  . Stress: Not on file  Relationships  . Social connections:    Talks on phone: Not on file    Gets together: Not on file    Attends religious service: Not on file    Active member of club or organization: Not on file     Attends meetings of clubs or organizations: Not on file    Relationship status: Not on file  . Intimate partner violence:    Fear of current or ex partner: Not on file    Emotionally abused: Not on file    Physically abused: Not on file    Forced sexual activity: Not on file  Other Topics Concern  . Not on file  Social History Narrative   Lives with husband, no major dietary restrictions, retired from teaching      Husband dx with cancer MDS June 2017.   Currently in remission. (11/03/16 pc)       Family Status  Relation Name Status  . Mother  Deceased at age 36       Dementia, breast cancer, bipolar  . Father  Deceased at age 52       heart failure, rheumatoid arthritis  . Sister Florian Buff       arthritis, PD  . Sister Keturah Shavers  . Daughter Maudie Mercury-  adopted Alive       64  . Son Jenny Reichmann- adopted Alive       47  . MGM  Deceased  . MGF  Deceased  . PGM  Deceased  . PGF  Deceased  . Sister Peg Alive        healthy  . Sister Robina Ade       healthy  . Daughter maggie Alive       28/ healthy  . Neg Hx  (Not Specified)    Review of Systems A complete 10 system ROS was obtained and was negative apart from what is mentioned.   Objective:   VITALS:   Vitals:   03/26/18 1018  BP: 110/70  Pulse: 78  SpO2: 94%  Weight: 250 lb 6 oz (113.6 kg)  Height: 5\' 4"  (1.626 m)   Wt Readings from Last 3 Encounters:  03/26/18 250 lb 6 oz (113.6 kg)  01/08/18 250 lb 9.6 oz (113.7 kg)  10/23/17 251 lb (113.9 kg)  GEN:  The patient appears stated age and is in NAD. HEENT:  Normocephalic, atraumatic.  The mucous membranes are moist. The superficial temporal arteries are without ropiness or tenderness. CV:  RRR Lungs:  CTAB Neck/HEME:  There are no carotid bruits bilaterally.  Neurological examination:  Orientation: The patient is alert and oriented x3. Cranial nerves: There is good facial symmetry. The speech is fluent and clear. Soft palate rises symmetrically and there is no  tongue deviation. Hearing is intact to conversational tone. Sensation: Sensation is intact to light touch throughout Motor: Strength is 5/5 in the bilateral upper and lower extremities.   Shoulder shrug is equal and symmetric.  There is no pronator drift.  Movement examination: Tone: There is mild increased tone in the RUE Abnormal movements: There is rare RUE resting tremor, mild.  There is occasional LE tremor, independent Coordination:  There is mild decremation with finger taps and heel taps on the right.   Gait and Station: The patient has no significant difficulty arising out of a deep-seated chair without the use of the hands. The patient's stride length is fairly normal but decreased arm swing on the right  LABS  Lab Results  Component Value Date   TSH 1.70 11/16/2017   Lab Results  Component Value Date   WBC 5.0 01/08/2018   HGB 12.8 01/08/2018   HCT 38.6 01/08/2018   MCV 85.0 01/08/2018   PLT 243.0 01/08/2018     Chemistry      Component Value Date/Time   NA 141 11/16/2017 0816   NA 141 11/27/2016 1123   K 4.3 11/16/2017 0816   CL 105 11/16/2017 0816   CO2 30 11/16/2017 0816   BUN 25 (H) 11/16/2017 0816   BUN 13 11/27/2016 1123   CREATININE 0.79 11/16/2017 0816   CREATININE 0.71 01/04/2015 1046      Component Value Date/Time   CALCIUM 9.4 11/16/2017 0816   ALKPHOS 69 11/16/2017 0816   AST 18 11/16/2017 0816   ALT 5 11/16/2017 0816   BILITOT 0.5 11/16/2017 0816   BILITOT 0.4 11/27/2016 1123     Lab Results  Component Value Date   WBC 5.0 01/08/2018   HGB 12.8 01/08/2018   HCT 38.6 01/08/2018   MCV 85.0 01/08/2018   PLT 243.0 01/08/2018   Lab Results  Component Value Date   IRON 73 01/08/2018   TIBC 402 03/05/2017   FERRITIN 22.3 01/08/2018        Assessment/Plan:  1. idiopathic Parkinson's disease.  The patient has tremor, bradykinesia, rigidity and mild postural instability.  -continue pramipexole 1 mg in the morning and continue the 0.5 mg  in the afternoon and evening.  She has had no compulsive side effects.  Risks, benefits, side effects and alternative therapies were discussed.  The opportunity to ask questions was given and they were answered to the best of my ability.  The patient expressed understanding and willingness to follow the outlined treatment protocols.  -increase carbidopa/levodopa 25/100 to 2/2/1.  She is a little more stiff than in the past and moving more slowly.   Risks, benefits, side effects and alternative therapies were discussed.  The opportunity to ask questions was given and they were answered to the best of my ability.  The patient expressed understanding and willingness to follow the outlined treatment protocols.  -She will continue Carbidopa/levodopa 50/200 at night.  This has definitely helped restless leg and has helped most of the nighttime cramping of the feet and legs.  -wants to get back to PT.  Will let me know if needs new referral.  Thinks that she has an active referral  -return to exercise  -discussed PARTS program and she is going to enroll 2.  Depression and anxiety  - She is on a combination of Lexapro and Cymbalta and those dosages were just altered by PCP a few weeks ago  -She is now seeing a counselor and will continue to do so and enrolled in PD life coaching 3.  Dizziness  -improved 4.  Dysphagia  -last MBE 12/2015.  Liquids becoming slightly more problematic.  Talked about chin tuck maneuver.  Use straw.   5.  Rheumatoid arthritis  -Saw rheumatology and received a diagnosis of rheumatoid arthritis and was started on Plaquenil.  Knows importance of eye examination 6.  Follow up is anticipated in the next 4-5 months, sooner should new neurologic issues arise.  Much greater than 50% of this visit was spent in counseling and coordinating care.  Total face to face time:  25 min

## 2018-03-26 ENCOUNTER — Ambulatory Visit (INDEPENDENT_AMBULATORY_CARE_PROVIDER_SITE_OTHER): Payer: Medicare Other | Admitting: Neurology

## 2018-03-26 ENCOUNTER — Encounter: Payer: Self-pay | Admitting: Neurology

## 2018-03-26 VITALS — BP 110/70 | HR 78 | Ht 64.0 in | Wt 250.4 lb

## 2018-03-26 DIAGNOSIS — G20 Parkinson's disease: Secondary | ICD-10-CM | POA: Diagnosis not present

## 2018-03-26 DIAGNOSIS — M069 Rheumatoid arthritis, unspecified: Secondary | ICD-10-CM

## 2018-03-26 NOTE — Patient Instructions (Addendum)
1.  Increase carbidopa/levodopa 25/100 to 2 tablets in the early AM, 2 in the afternoon, and continue 1 in the evening 2.  Continue carbidopa/levodopa 50/200 at bedtime 3.  Continue pramipexole 0.5 mg, 2 in the AM, 1 in the afternoon and 1 in the evening

## 2018-03-29 ENCOUNTER — Ambulatory Visit: Payer: Self-pay | Admitting: Family Medicine

## 2018-03-29 NOTE — Telephone Encounter (Signed)
Pt c/o dry hacking cough that started yesterday. Pt denies fever, shortness of breath, runny nose, wheezing,chest pain, or coughing up blood. Pt stated that she was worried about her cough because her husband is in the hospital for pneumonia. Pt stated that she has rheumatoid arthritis and has been on steroids since Monday.  Home care advice given per protocol. Pt verbalized understanding.  Reason for Disposition . Cough  Answer Assessment - Initial Assessment Questions 1. ONSET: "When did the cough begin?"      yesterday 2. SEVERITY: "How bad is the cough today?"      Hacking cough 3. RESPIRATORY DISTRESS: "Describe your breathing."      No shortness of breath 4. FEVER: "Do you have a fever?" If so, ask: "What is your temperature, how was it measured, and when did it start?"     No 98.1 5. HEMOPTYSIS: "Are you coughing up any blood?" If so ask: "How much?" (flecks, streaks, tablespoons, etc.)     no 6. TREATMENT: "What have you done so far to treat the cough?" (e.g., meds, fluids, humidifier)     Nothing to treat 7. CARDIAC HISTORY: "Do you have any history of heart disease?" (e.g., heart attack, congestive heart failure)      no 8. LUNG HISTORY: "Do you have any history of lung disease?"  (e.g., pulmonary embolus, asthma, emphysema)     no 9. PE RISK FACTORS: "Do you have a history of blood clots?" (or: recent major surgery, recent prolonged travel, bedridden)     no 10. OTHER SYMPTOMS: "Do you have any other symptoms? (e.g., runny nose, wheezing, chest pain)       No- 11. PREGNANCY: "Is there any chance you are pregnant?" "When was your last menstrual period?"       n/a 12. TRAVEL: "Have you traveled out of the country in the last month?" (e.g., travel history, exposures) no  Protocols used: COUGH - ACUTE NON-PRODUCTIVE-A-AH

## 2018-04-01 ENCOUNTER — Ambulatory Visit: Payer: Self-pay

## 2018-04-01 NOTE — Telephone Encounter (Signed)
Outgoing call to patient who complains of temperature of 100.0 via digital thermometer.  The temperature  Started this am.  Patient has a persistent cough along with temp.  Husband is immunocompromised.  Diagnosed with RSV.  Currently hospitalized at [facility].  The patient is not immunocompromised.  Patient states that the cough started this past Wednesday.  Rates it moderate.  Has occasional wheezing.  Denies hemoptysis. Has treated cough with cough syrup.  .  Denies cardiac history and lung history.  Denies PE risks. Patient reassess temperature it was 99.5    Provided care advice.  Appointment made for  7/ 30/19 at 10:40 am Patient voiced understanding.    Reason for Disposition . [1] Continuous (nonstop) coughing interferes with work or school AND [2] no improvement using cough treatment per protocol  Answer Assessment - Initial Assessment Questions 1. TEMPERATURE: "What is the most recent temperature?"  "How was it measured?"      oral 2. ONSET: "When did the fever start?"      This am 700am 3. SYMPTOMS: "Do you have any other symptoms besides the fever?"  (e.g., colds, headache, sore throat, earache, cough, rash, diarrhea, vomiting, abdominal pain)     Cough,  4. CAUSE: If there are no symptoms, ask: "What do you think is causing the fever?"      ? 5. CONTACTS: "Does anyone else in the family have an infection?"      husband 6. TREATMENT: "What have you done so far to treat this fever?" (e.g., medications)     nothing 7. IMMUNOCOMPROMISE: "Do you have of the following: diabetes, HIV positive, splenectomy, cancer chemotherapy, chronic steroid treatment, transplant patient, etc."     See note 8. PREGNANCY: "Is there any chance you are pregnant?" "When was your last menstrual period?"     na 9. TRAVEL: "Have you traveled out of the country in the last month?" (e.g., travel history, exposures)     no  Answer Assessment - Initial Assessment Questions 1. ONSET: "When did the cough begin?"   wednesday 2. SEVERITY: "How bad is the cough today?"      Moderate to  3. RESPIRATORY DISTRESS: "Describe your breathing."      Occasional wheezing 4. FEVER: "Do you have a fever?" If so, ask: "What is your temperature, how was it measured, and when did it start?"     100.1 5. HEMOPTYSIS: "Are you coughing up any blood?" If so ask: "How much?" (flecks, streaks, tablespoons, etc.)     no 6. TREATMENT: "What have you done so far to treat the cough?" (e.g., meds, fluids, humidifier)     Cough syrup 7. CARDIAC HISTORY: "Do you have any history of heart disease?" (e.g., heart attack, congestive heart failure)      no 8. LUNG HISTORY: "Do you have any history of lung disease?"  (e.g., pulmonary embolus, asthma, emphysema)     no 9. PE RISK FACTORS: "Do you have a history of blood clots?" (or: recent major surgery, recent prolonged travel, bedridden)     no 10. OTHER SYMPTOMS: "Do you have any other symptoms? (e.g., runny nose, wheezing, chest pain)       wheezing 11. PREGNANCY: "Is there any chance you are pregnant?" "When was your last menstrual period?"        na 12. TRAVEL: "Have you traveled out of the country in the last month?" (e.g., travel history, exposures)        no  Protocols used: COUGH - ACUTE NON-PRODUCTIVE-A-AH,  FEVER-A-AH

## 2018-04-02 ENCOUNTER — Ambulatory Visit (HOSPITAL_BASED_OUTPATIENT_CLINIC_OR_DEPARTMENT_OTHER)
Admission: RE | Admit: 2018-04-02 | Discharge: 2018-04-02 | Disposition: A | Discharge status: Home / Self Care | Payer: Medicare Other | Source: Ambulatory Visit | Attending: Medical | Admitting: Medical

## 2018-04-02 ENCOUNTER — Ambulatory Visit (INDEPENDENT_AMBULATORY_CARE_PROVIDER_SITE_OTHER): Payer: Medicare Other | Admitting: Medical

## 2018-04-02 ENCOUNTER — Encounter: Payer: Self-pay | Admitting: Medical

## 2018-04-02 VITALS — BP 120/70 | HR 79 | Temp 98.2°F | Resp 17 | Ht 64.0 in | Wt 250.0 lb

## 2018-04-02 DIAGNOSIS — R5383 Other fatigue: Secondary | ICD-10-CM | POA: Diagnosis not present

## 2018-04-02 DIAGNOSIS — R05 Cough: Secondary | ICD-10-CM

## 2018-04-02 DIAGNOSIS — R059 Cough, unspecified: Secondary | ICD-10-CM

## 2018-04-02 DIAGNOSIS — K449 Diaphragmatic hernia without obstruction or gangrene: Secondary | ICD-10-CM | POA: Insufficient documentation

## 2018-04-02 DIAGNOSIS — J40 Bronchitis, not specified as acute or chronic: Secondary | ICD-10-CM | POA: Diagnosis not present

## 2018-04-02 LAB — CBC WITH DIFFERENTIAL/PLATELET
Basophils Absolute: 0 10*3/uL (ref 0.0–0.1)
Basophils Relative: 0.6 % (ref 0.0–3.0)
Eosinophils Absolute: 0.1 10*3/uL (ref 0.0–0.7)
Eosinophils Relative: 1.5 % (ref 0.0–5.0)
HCT: 38.6 % (ref 36.0–46.0)
Hemoglobin: 12.6 g/dL (ref 12.0–15.0)
Lymphocytes Relative: 22.4 % (ref 12.0–46.0)
Lymphs Abs: 1.3 10*3/uL (ref 0.7–4.0)
MCHC: 32.8 g/dL (ref 30.0–36.0)
MCV: 87.6 fl (ref 78.0–100.0)
Monocytes Absolute: 0.6 10*3/uL (ref 0.1–1.0)
Monocytes Relative: 11 % (ref 3.0–12.0)
Neutro Abs: 3.8 10*3/uL (ref 1.4–7.7)
Neutrophils Relative %: 64.5 % (ref 43.0–77.0)
Platelets: 208 10*3/uL (ref 150.0–400.0)
RBC: 4.4 Mil/uL (ref 3.87–5.11)
RDW: 14.9 % (ref 11.5–15.5)
WBC: 5.9 10*3/uL (ref 4.0–10.5)

## 2018-04-02 LAB — COMPREHENSIVE METABOLIC PANEL
ALT: 6 U/L (ref 0–35)
AST: 20 U/L (ref 0–37)
Albumin: 4.2 g/dL (ref 3.5–5.2)
Alkaline Phosphatase: 73 U/L (ref 39–117)
BUN: 20 mg/dL (ref 6–23)
CO2: 32 mEq/L (ref 19–32)
Calcium: 9.6 mg/dL (ref 8.4–10.5)
Chloride: 104 mEq/L (ref 96–112)
Creatinine, Ser: 0.83 mg/dL (ref 0.40–1.20)
GFR: 72.68 mL/min (ref >60.00)
Glucose, Bld: 112 mg/dL — ABNORMAL HIGH (ref 70–99)
Potassium: 4.8 mEq/L (ref 3.5–5.1)
Sodium: 141 mEq/L (ref 135–145)
Total Bilirubin: 0.6 mg/dL (ref 0.2–1.2)
Total Protein: 6.7 g/dL (ref 6.0–8.3)

## 2018-04-02 MED ORDER — ALBUTEROL SULFATE HFA 108 (90 BASE) MCG/ACT IN AERS: 2.0000 | INHALATION_SPRAY | Freq: Four times a day (QID) | RESPIRATORY_TRACT | 2 refills | Status: DC | PRN

## 2018-04-02 MED ORDER — DOXYCYCLINE HYCLATE 100 MG PO TABS: 100.0000 mg | ORAL_TABLET | Freq: Two times a day (BID) | ORAL | 0 refills | Status: DC

## 2018-04-02 MED ORDER — PREDNISONE 10 MG PO TABS: ORAL_TABLET | ORAL | 0 refills | Status: DC

## 2018-04-02 MED ORDER — BENZONATATE 100 MG PO CAPS: 100.0000 mg | ORAL_CAPSULE | Freq: Three times a day (TID) | ORAL | 0 refills | Status: DC | PRN

## 2018-04-02 NOTE — Patient Instructions (Signed)
You do have symptoms of bronchitis and recent exposure to husband who had RSV.  Since he is coming back from hospital today I do want to do a work-up to evaluate the extent of your current illness.  Please get CBC, CMP and chest x-ray today.  I am prescribing doxycycline antibiotic.  Rx advisement given.  For cough making benzonatate tablets available.  For wheezing you can use albuterol 2 inhalations every 4-6 hours.  If having to use albuterol repetitively then go ahead and start 6-day taper dose of prednisone.  Print prescription given today.  We will try to update you by calling or MyChart after labs and imaging studies are back.  Follow-up in 7 days or as needed.

## 2018-04-02 NOTE — Progress Notes (Signed)
Subjective:    Patient ID: Kristin [name], female    DOB: 1951-03-15, 67 y.o.   MRN: [id number]  HPI  Pt in for some cough, St, hoarse voice and chest congestion. Pt husband had pneumonia recently and eventually he was dx with RSV. He is immune compoimised. Pt husband coming home today.  Pt states felt feverish yesterday(temp yesterday was 101). No fever today. No meds for fever.   Pt is clammy last few days.   Pt feels like needs to bring need to bring up mucus but can't.   Pt feels like transient mild wheeze recently.     Review of Systems  Constitutional: Negative for chills, fatigue and fever.  Respiratory: Positive for cough and wheezing. Negative for chest tightness and shortness of breath.        Presently occasional transient expiratory wheeze.  Cardiovascular: Negative for chest pain and palpitations.  Gastrointestinal: Negative for abdominal pain.  Musculoskeletal: Negative for back pain.       No leg pain.  Skin: Negative for rash.  Hematological: Negative for adenopathy. Does not bruise/bleed easily.  Psychiatric/Behavioral: Negative for behavioral problems and confusion. The patient is not nervous/anxious and is not hyperactive.    Past Medical History:  Diagnosis Date  . Abnormal vaginal Pap smear   . Acute pharyngitis 02/25/2013  . Anemia   . Anxiety   . Arthritis   . 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 Chicken pox as a child  . Chronic UTI    sees dr Terance Hart  . Constipation 12/07/2015  . Depression with anxiety 11/02/2009   Qualifier: Diagnosis of  By: [name], LPN, [name]   . Dermatitis 07/17/2012  . Esophageal stricture 1994  . Fibroids   . Foot pain, bilateral 06/19/2012  . GERD (gastroesophageal reflux disease)   . GERD (gastroesophageal reflux disease)   . Hiatal hernia   . Hyperglycemia 08/19/2013  . 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 Hyperlipidemia   . Hypertension   . Infertility, female   . Low back pain 10/17/2007    Qualifier: Diagnosis of  By: [name], [name]   . Measles as a child  . Obesity   . Osteoarthritis   . Parkinson disease ([place])   . Plantar fasciitis of left foot 06/19/2012  . Preventative health care 12/19/2015  . Rheumatoid arthritis ([place]) 01/08/2018  . Rosacea 10/05/2014  . Swallowing difficulty   . Urinary frequency 02/25/2013  . Visual floaters 05/11/2014     Social History   Socioeconomic History  . Marital status: Married    Spouse name: Not on file  . Number of children: Not on file  . Years of education: Not on file  . Highest education level: Not on file  Occupational History  . Occupation: Retired Teacher, music  Social Needs  . Financial resource strain: Not on file  . Food insecurity:    Worry: Not on file    Inability: Not on file  . Transportation needs:    Medical: Not on file    Non-medical: Not on file  Tobacco Use  . Smoking status: Never Smoker  . Smokeless tobacco: Never Used  Substance and Sexual Activity  . Alcohol use: Yes    Alcohol/week: 2.4 oz    Types: 4 Glasses of wine per week    Comment: 2 glasses of wine weekly  . Drug use: No  . Sexual activity: Not Currently    Partners: Male  Lifestyle  . Physical activity:    Days per week: Not on file    Minutes per session: Not on file  . Stress: Not on file  Relationships  . Social connections:    Talks on phone: Not on file    Gets together: Not on file    Attends religious service: Not on file    Active member of club or organization: Not on file    Attends meetings of clubs or organizations: Not on file    Relationship status: Not on file  . Intimate partner violence:    Fear of current or ex partner: Not on file    Emotionally abused: Not on file    Physically abused: Not on file    Forced sexual activity: Not on file  Other Topics Concern  . Not on file  Social History Narrative   Lives with husband, no major dietary restrictions, retired from teaching      Husband dx with  cancer MDS June 2017.   Currently in remission. (11/03/16 pc)       Past Surgical History:  Procedure Laterality Date  . ABDOMINAL HYSTERECTOMY  2006   total  . esophageal     stretching  . laporoscopy    . PARTIAL HIP ARTHROPLASTY  2010   right  . TONSILLECTOMY    . wisdom teeth extracted      Family History  Problem Relation Age of Onset  . Cancer Mother        breast  . Other Mother        arrythmia  . Mental illness Mother        bipolar  . Hyperlipidemia Mother   . Thyroid disease Mother   . Depression Mother   . Bipolar disorder Mother   . Heart disease Father   . Arthritis Father        rheumatoid  . Hypertension Father   . Hyperlipidemia Father   . Depression Sister   . Mental illness Sister        bipolar  . Parkinson's disease Sister   . Arthritis Sister   . Arthritis Sister   . Arthritis Sister   . Colon cancer Neg Hx   . Esophageal cancer Neg Hx   . Rectal cancer Neg Hx   . Stomach cancer Neg Hx     Allergies  Allergen Reactions  . Bactrim [Sulfamethoxazole-Trimethoprim] Other (See Comments)    Oral ulcers and rash  . Penicillins Hives    Current Outpatient Medications on File Prior to Visit  Medication Sig Dispense Refill  . ALPRAZolam (XANAX) 0.25 MG tablet Take 0.5-1 tablets (0.125-0.25 mg total) by mouth 2 (two) times daily as needed for anxiety. 30 tablet 1  . B-D TB SYRINGE 1CC/27GX1/2" 27G X 1/2" 1 ML MISC     . carbidopa-levodopa (SINEMET CR) 50-200 MG tablet TAKE 1 TABLET BY MOUTH AT BEDTIME 90 tablet 1  . carbidopa-levodopa (SINEMET IR) 25-100 MG tablet TAKE 1 TABLET BY MOUTH 3 TIMES DAILY (Patient taking differently: 5 daily) 90 tablet 5  . Cholecalciferol (VITAMIN D) 2000 units CAPS Take by mouth.    . diclofenac sodium (VOLTAREN) 1 % GEL Apply 1 g topically as needed.  1  . DULoxetine (CYMBALTA) 30 MG capsule Take 1 capsule (30 mg total) by mouth daily. 90 capsule 0  . escitalopram (LEXAPRO) 20 MG tablet Take 1 tablet (20 mg  total) by mouth daily. 90 tablet 2  . ferrous fumarate (HEMOCYTE -  106 MG FE) 325 (106 Fe) MG TABS tablet Take 1 tablet (106 mg of iron total) by mouth daily. 90 each 1  . folic acid (FOLVITE) 1 MG tablet     . Methotrexate Sodium (METHOTREXATE, PF,) 50 MG/2ML injection     . omeprazole (PRILOSEC) 20 MG capsule Take 1 capsule (20 mg total) by mouth daily. 90 capsule 1  . polyethylene glycol powder (GLYCOLAX/MIRALAX) powder Take 17 g by mouth 2 (two) times daily as needed (1 to 2 times per day, as needed). 850 g 0  . pramipexole (MIRAPEX) 0.5 MG tablet TAKE 2 TABLETS BY MOUTH IN THE MORNING AND 1 TABLET IN THE AFTERNOON AND 1 TABLET IN THE EVENING 360 tablet 1  . rosuvastatin (CRESTOR) 10 MG tablet TAKE 1 TABLET BY MOUTH ON MONDAYS, WEDNESDAYS AND FRIDAYS 54 tablet 1  . telmisartan (MICARDIS) 80 MG tablet TAKE 1 TABLET BY MOUTH EVERY DAY 90 tablet 1   No current facility-administered medications on file prior to visit.     BP 120/70   Pulse 79   Temp 98.2 F (36.8 C) (Oral)   Resp 17   Ht 5\' 4"  (1.626 m)   Wt 250 lb (113.4 kg)   SpO2 97%   BMI 42.91 kg/m       Objective:   Physical Exam   General  Mental Status - Alert. General Appearance - Well groomed. Not in acute distress.  Skin Rashes- No Rashes.  HEENT Head- Normal. Ear Auditory Canal - Left- Normal. Right - Normal.Tympanic Membrane- Left- Normal. Right- Normal. Eye Sclera/Conjunctiva- Left- Normal. Right- Normal. Nose & Sinuses Nasal Mucosa- Left-  Not boggy or Congested. Right-  Not  boggy or Congested. Mouth & Throat Lips: Upper Lip- Normal: no dryness, cracking, pallor, cyanosis, or vesicular eruption. Lower Lip-Normal: no dryness, cracking, pallor, cyanosis or vesicular eruption. Buccal Mucosa- Bilateral- No Aphthous ulcers. Oropharynx- No Discharge or Erythema. Tonsils: Characteristics- Bilateral- No Erythema or Congestion. Size/Enlargement- Bilateral- No enlargement. Discharge- bilateral-None.  Neck Neck-  Supple. No Masses.   Chest and Lung Exam Auscultation: Breath Sounds:- even and unlabored. But mild shallow breathing. Mild faint intermittent wheeze. Cardiovascular Auscultation:Rythm- Regular, rate and rhythm. Murmurs & Other Heart Sounds:Ausculatation of the heart reveal- No Murmurs.  Lymphatic Head & Neck General Head & Neck Lymphatics: Bilateral: Description- No Localized lymphadenopathy.      Assessment & Plan:  You do have symptoms of bronchitis and recent exposure to husband who had RSV.  Since he is coming back from hospital today I do want to do a work-up to evaluate the extent of your current illness.  Please get CBC, CMP and chest x-ray today.  I am prescribing doxycycline antibiotic.  Rx advisement given.  For cough making benzonatate tablets available.  For wheezing you can use albuterol 2 inhalations every 4-6 hours.  If having to use albuterol repetitively then go ahead and start 6-day taper dose of prednisone.  Print prescription given today.  We will try to update you by calling or MyChart after labs and imaging studies are back.  Follow-up in 7 days or as needed.  Mackie Pai, PA-C

## 2018-04-22 DIAGNOSIS — M0579 Rheumatoid arthritis with rheumatoid factor of multiple sites without organ or systems involvement: Secondary | ICD-10-CM | POA: Diagnosis not present

## 2018-04-29 DIAGNOSIS — F331 Major depressive disorder, recurrent, moderate: Secondary | ICD-10-CM | POA: Diagnosis not present

## 2018-05-07 DIAGNOSIS — Z23 Encounter for immunization: Secondary | ICD-10-CM | POA: Diagnosis not present

## 2018-05-10 ENCOUNTER — Ambulatory Visit (INDEPENDENT_AMBULATORY_CARE_PROVIDER_SITE_OTHER): Payer: Medicare Other | Admitting: Family Medicine

## 2018-05-10 ENCOUNTER — Encounter: Payer: Self-pay | Admitting: Family Medicine

## 2018-05-10 VITALS — BP 120/72 | HR 81 | Temp 98.7°F | Resp 18 | Ht 64.0 in | Wt 251.4 lb

## 2018-05-10 DIAGNOSIS — E559 Vitamin D deficiency, unspecified: Secondary | ICD-10-CM | POA: Diagnosis not present

## 2018-05-10 DIAGNOSIS — E669 Obesity, unspecified: Secondary | ICD-10-CM | POA: Diagnosis not present

## 2018-05-10 DIAGNOSIS — M069 Rheumatoid arthritis, unspecified: Secondary | ICD-10-CM

## 2018-05-10 DIAGNOSIS — R05 Cough: Secondary | ICD-10-CM | POA: Diagnosis not present

## 2018-05-10 DIAGNOSIS — F418 Other specified anxiety disorders: Secondary | ICD-10-CM

## 2018-05-10 DIAGNOSIS — M5416 Radiculopathy, lumbar region: Secondary | ICD-10-CM | POA: Diagnosis not present

## 2018-05-10 DIAGNOSIS — R7303 Prediabetes: Secondary | ICD-10-CM

## 2018-05-10 DIAGNOSIS — R059 Cough, unspecified: Secondary | ICD-10-CM

## 2018-05-10 DIAGNOSIS — F331 Major depressive disorder, recurrent, moderate: Secondary | ICD-10-CM | POA: Diagnosis not present

## 2018-05-10 DIAGNOSIS — E785 Hyperlipidemia, unspecified: Secondary | ICD-10-CM | POA: Diagnosis not present

## 2018-05-10 DIAGNOSIS — I10 Essential (primary) hypertension: Secondary | ICD-10-CM | POA: Diagnosis not present

## 2018-05-10 DIAGNOSIS — Z23 Encounter for immunization: Secondary | ICD-10-CM

## 2018-05-10 LAB — LIPID PANEL
Cholesterol: 152 mg/dL (ref 0–200)
HDL: 62 mg/dL (ref >39.00)
LDL Cholesterol: 74 mg/dL (ref 0–99)
NonHDL: 90.11
Total CHOL/HDL Ratio: 2
Triglycerides: 82 mg/dL (ref 0.0–149.0)
VLDL: 16.4 mg/dL (ref 0.0–40.0)

## 2018-05-10 LAB — COMPREHENSIVE METABOLIC PANEL
ALT: 7 U/L (ref 0–35)
AST: 17 U/L (ref 0–37)
Albumin: 4 g/dL (ref 3.5–5.2)
Alkaline Phosphatase: 72 U/L (ref 39–117)
BUN: 17 mg/dL (ref 6–23)
CO2: 31 mEq/L (ref 19–32)
Calcium: 9.2 mg/dL (ref 8.4–10.5)
Chloride: 108 mEq/L (ref 96–112)
Creatinine, Ser: 0.78 mg/dL (ref 0.40–1.20)
GFR: 78.06 mL/min (ref >60.00)
Glucose, Bld: 103 mg/dL — ABNORMAL HIGH (ref 70–99)
Potassium: 4.5 mEq/L (ref 3.5–5.1)
Sodium: 144 mEq/L (ref 135–145)
Total Bilirubin: 0.6 mg/dL (ref 0.2–1.2)
Total Protein: 6.3 g/dL (ref 6.0–8.3)

## 2018-05-10 LAB — CBC
HCT: 36.5 % (ref 36.0–46.0)
Hemoglobin: 12 g/dL (ref 12.0–15.0)
MCHC: 32.9 g/dL (ref 30.0–36.0)
MCV: 86.5 fl (ref 78.0–100.0)
Platelets: 226 10*3/uL (ref 150.0–400.0)
RBC: 4.22 Mil/uL (ref 3.87–5.11)
RDW: 15 % (ref 11.5–15.5)
WBC: 5 10*3/uL (ref 4.0–10.5)

## 2018-05-10 LAB — VITAMIN D 25 HYDROXY (VIT D DEFICIENCY, FRACTURES): VITD: 31.45 ng/mL (ref 30.00–100.00)

## 2018-05-10 LAB — TSH: TSH: 1.51 u[IU]/mL (ref 0.35–4.50)

## 2018-05-10 LAB — HEMOGLOBIN A1C: Hgb A1c MFr Bld: 6.1 % (ref 4.6–6.5)

## 2018-05-10 NOTE — Assessment & Plan Note (Signed)
Intermittent can try Tessalon Perles, warm tea with lemon and honey and/or willow bark tea and come in if worsens

## 2018-05-10 NOTE — Patient Instructions (Signed)
Rheumatoid Arthritis Rheumatoid arthritis (RA) is a long-term (chronic) disease. RA causes inflammation in your joints. Your joints may feel painful, stiff, swollen, warm, or tender. RA may start slowly. Usually, it affects the small joints of the hands and feet. It can also affect other parts of the body, even the heart, eyes, or lungs. Symptoms of RA often come and go. Sometimes, symptoms get worse for a while. These are called flares. There is no cure for RA, but your doctor will work with you to find the best treatment option for you. This will depend on how the disease is changing in your body. Follow these instructions at home:  Take over-the-counter and prescription medicines only as told by your doctor. Your doctor may change (adjust) your medicines every 3 months.  Start an exercise program as told by your doctor.  Rest when you have a flare.  Return to your normal activities as told by your doctor. Ask your doctor what activities are safe for you.  Keep all follow-up visits as told by your doctor. This is important. Contact a doctor if:  You have a flare.  You have a fever.  You have problems (side effects) because of your medicines. Get help right away if:  You have chest pain.  You have trouble breathing.  You have a hot, painful joint all of a sudden, and it is worse than your usual joint aches. This information is not intended to replace advice given to you by your health care provider. Make sure you discuss any questions you have with your health care provider. Document Released: 11/13/2011 Document Revised: 01/27/2016 Document Reviewed: 06/03/2015 Elsevier Interactive Patient Education  2018 Elsevier Inc.  

## 2018-05-10 NOTE — Assessment & Plan Note (Signed)
Doing better with new ratio of Cymbalta and Lexapro

## 2018-05-10 NOTE — Assessment & Plan Note (Signed)
hgba1c acceptable, minimize simple carbs. Increase exercise as tolerated.  

## 2018-05-10 NOTE — Assessment & Plan Note (Signed)
Supplement and monitor 

## 2018-05-10 NOTE — Progress Notes (Signed)
Subjective:    Patient ID: Kristin [name], female    DOB: 1951-08-26, 67 y.o.   MRN: [id number]  No chief complaint on file.   HPI Patient is in today for follow up. She is doing well most days. No recent febrile illness or hospitalizations. She continues to struggle with fatigue and chronic pain from her Rheumatoid Arthritis. Rheumatology has suggested that she start MTX but she is apprehensive due to he father dying of infectious causes while on [place]. She is still considering but is apprehensive. She has not had any new concerns. Denies CP/palp/SOB/HA/congestion/fevers/GI or GU c/o. Taking meds as prescribed  Past Medical History:  Diagnosis Date  . Abnormal vaginal Pap smear   . Acute pharyngitis 02/25/2013  . Anemia   . Anxiety   . Arthritis   . 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 Chicken pox as a child  . Chronic UTI    sees dr Terance Hart  . Constipation 12/07/2015  . Depression with anxiety 11/02/2009   Qualifier: Diagnosis of  By: [name], LPN, [name]   . Dermatitis 07/17/2012  . Esophageal stricture 1994  . Fibroids   . Foot pain, bilateral 06/19/2012  . GERD (gastroesophageal reflux disease)   . GERD (gastroesophageal reflux disease)   . Hiatal hernia   . Hyperglycemia 08/19/2013  . 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 Hyperlipidemia   . Hypertension   . Infertility, female   . Low back pain 10/17/2007   Qualifier: Diagnosis of  By: [name], [name]   . Measles as a child  . Obesity   . Osteoarthritis   . Parkinson disease ([place])   . Plantar fasciitis of left foot 06/19/2012  . Preventative health care 12/19/2015  . Rheumatoid arthritis ([place]) 01/08/2018  . Rosacea 10/05/2014  . Swallowing difficulty   . Urinary frequency 02/25/2013  . Visual floaters 05/11/2014    Past Surgical History:  Procedure Laterality Date  . ABDOMINAL HYSTERECTOMY  2006   total  . esophageal     stretching  . laporoscopy    . PARTIAL HIP ARTHROPLASTY  2010   right  . TONSILLECTOMY    . wisdom teeth extracted      Family History  Problem Relation Age of Onset  . Cancer Mother        breast  . Other Mother        arrythmia  . Mental illness Mother        bipolar  . Hyperlipidemia Mother   . Thyroid disease Mother   . Depression Mother   . Bipolar disorder Mother   . Heart disease Father   . Arthritis Father        rheumatoid  . Hypertension Father   . Hyperlipidemia Father   . Depression Sister   . Mental illness Sister        bipolar  . Parkinson's disease Sister   . Arthritis Sister   . Arthritis Sister   . Arthritis Sister   . Colon cancer Neg Hx   . Esophageal cancer Neg Hx   . Rectal cancer Neg Hx   . Stomach cancer Neg Hx     Social History   Socioeconomic History  . Marital status: Married    Spouse name: Not on file  . Number of children: Not on file  . Years of education: Not on file  . Highest education level: Not on file  Occupational History  . Occupation: Retired  Admin instructor  Social Needs  . Financial resource strain: Not on file  . Food insecurity:    Worry: Not on file    Inability: Not on file  . Transportation needs:    Medical: Not on file    Non-medical: Not on file  Tobacco Use  . Smoking status: Never Smoker  . Smokeless tobacco: Never Used  Substance and Sexual Activity  . Alcohol use: Yes    Alcohol/week: 4.0 standard drinks    Types: 4 Glasses of wine per week    Comment: 2 glasses of wine weekly  . Drug use: No  . Sexual activity: Not Currently    Partners: Male  Lifestyle  . Physical activity:    Days per week: Not on file    Minutes per session: Not on file  . Stress: Not on file  Relationships  . Social connections:    Talks on phone: Not on file    Gets together: Not on file    Attends religious service: Not on file    Active member of club or organization: Not on file    Attends meetings of clubs or organizations: Not on file    Relationship status: Not on file    . Intimate partner violence:    Fear of current or ex partner: Not on file    Emotionally abused: Not on file    Physically abused: Not on file    Forced sexual activity: Not on file  Other Topics Concern  . Not on file  Social History Narrative   Lives with husband, no major dietary restrictions, retired from teaching      Husband dx with cancer MDS June 2017.   Currently in remission. (11/03/16 pc)       Outpatient Medications Prior to Visit  Medication Sig Dispense Refill  . ALPRAZolam (XANAX) 0.25 MG tablet Take 0.5-1 tablets (0.125-0.25 mg total) by mouth 2 (two) times daily as needed for anxiety. 30 tablet 1  . B-D TB SYRINGE 1CC/27GX1/2" 27G X 1/2" 1 ML MISC     . carbidopa-levodopa (SINEMET IR) 25-100 MG tablet TAKE 1 TABLET BY MOUTH 3 TIMES DAILY (Patient taking differently: 5 daily) 90 tablet 5  . Cholecalciferol (VITAMIN D) 2000 units CAPS Take by mouth.    . DULoxetine (CYMBALTA) 30 MG capsule Take 1 capsule (30 mg total) by mouth daily. 90 capsule 0  . escitalopram (LEXAPRO) 20 MG tablet Take 1 tablet (20 mg total) by mouth daily. 90 tablet 2  . folic acid (FOLVITE) 1 MG tablet     . omeprazole (PRILOSEC) 20 MG capsule Take 1 capsule (20 mg total) by mouth daily. 90 capsule 1  . polyethylene glycol powder (GLYCOLAX/MIRALAX) powder Take 17 g by mouth 2 (two) times daily as needed (1 to 2 times per day, as needed). 850 g 0  . pramipexole (MIRAPEX) 0.5 MG tablet TAKE 2 TABLETS BY MOUTH IN THE MORNING AND 1 TABLET IN THE AFTERNOON AND 1 TABLET IN THE EVENING 360 tablet 1  . rosuvastatin (CRESTOR) 10 MG tablet TAKE 1 TABLET BY MOUTH ON MONDAYS, WEDNESDAYS AND FRIDAYS 54 tablet 1  . telmisartan (MICARDIS) 80 MG tablet TAKE 1 TABLET BY MOUTH EVERY DAY 90 tablet 1  . carbidopa-levodopa (SINEMET CR) 50-200 MG tablet TAKE 1 TABLET BY MOUTH AT BEDTIME 90 tablet 1  . Methotrexate Sodium (METHOTREXATE, PF,) 50 MG/2ML injection     . albuterol (PROVENTIL HFA;VENTOLIN HFA) 108 (90 Base)  MCG/ACT inhaler Inhale 2  puffs into the lungs every 6 (six) hours as needed for wheezing or shortness of breath. 1 Inhaler 2  . benzonatate (TESSALON) 100 MG capsule Take 1 capsule (100 mg total) by mouth 3 (three) times daily as needed for cough. 30 capsule 0  . diclofenac sodium (VOLTAREN) 1 % GEL Apply 1 g topically as needed.  1  . doxycycline (VIBRA-TABS) 100 MG tablet Take 1 tablet (100 mg total) by mouth 2 (two) times daily. Cap or generic 20 tablet 0  . ferrous fumarate (HEMOCYTE - 106 MG FE) 325 (106 Fe) MG TABS tablet Take 1 tablet (106 mg of iron total) by mouth daily. 90 each 1  . predniSONE (DELTASONE) 10 MG tablet 6 TAB PO DAY 1 5 TAB PO DAY 2 4 TAB PO DAY 3 3 TAB PO DAY 4 2 TAB PO DAY 5 1 TAB PO DAY 6 21 tablet 0   No facility-administered medications prior to visit.     Allergies  Allergen Reactions  . Bactrim [Sulfamethoxazole-Trimethoprim] Other (See Comments)    Oral ulcers and rash  . Penicillins Hives    Review of Systems  Constitutional: Positive for malaise/fatigue. Negative for fever.  HENT: Negative for congestion.   Eyes: Negative for blurred vision.  Respiratory: Negative for shortness of breath.   Cardiovascular: Negative for chest pain, palpitations and leg swelling.  Gastrointestinal: Negative for abdominal pain, blood in stool and nausea.  Genitourinary: Negative for dysuria and frequency.  Musculoskeletal: Positive for joint pain. Negative for falls.  Skin: Negative for rash.  Neurological: Negative for dizziness, loss of consciousness and headaches.  Endo/Heme/Allergies: Negative for environmental allergies.  Psychiatric/Behavioral: Negative for depression. The patient is not nervous/anxious.        Objective:    Physical Exam  Constitutional: She is oriented to person, place, and time. She appears well-developed and well-nourished. No distress.  HENT:  Head: Normocephalic and atraumatic.  Nose: Nose normal.  Eyes: Right eye exhibits no  discharge. Left eye exhibits no discharge.  Neck: Normal range of motion. Neck supple.  Cardiovascular: Normal rate and regular rhythm.  No murmur heard. Pulmonary/Chest: Effort normal and breath sounds normal.  Abdominal: Soft. Bowel sounds are normal. There is no tenderness.  Musculoskeletal: She exhibits no edema.  Neurological: She is alert and oriented to person, place, and time.  Skin: Skin is warm and dry.  Psychiatric: She has a normal mood and affect.  Nursing note and vitals reviewed.   BP 120/72 (BP Location: Left Arm, Patient Position: Sitting, Cuff Size: Normal)   Pulse 81   Temp 98.7 F (37.1 C) (Oral)   Resp 18   Ht 5\' 4"  (1.626 m)   Wt 251 lb 6.4 oz (114 kg)   SpO2 98%   BMI 43.15 kg/m  Wt Readings from Last 3 Encounters:  05/10/18 251 lb 6.4 oz (114 kg)  04/02/18 250 lb (113.4 kg)  03/26/18 250 lb 6 oz (113.6 kg)     Lab Results  Component Value Date   WBC 5.0 05/10/2018   HGB 12.0 05/10/2018   HCT 36.5 05/10/2018   PLT 226.0 05/10/2018   GLUCOSE 103 (H) 05/10/2018   CHOL 152 05/10/2018   TRIG 82.0 05/10/2018   HDL 62.00 05/10/2018   LDLDIRECT 161.5 10/07/2008   LDLCALC 74 05/10/2018   ALT 7 05/10/2018   AST 17 05/10/2018   NA 144 05/10/2018   K 4.5 05/10/2018   CL 108 05/10/2018   CREATININE 0.78 05/10/2018   BUN  17 05/10/2018   CO2 31 05/10/2018   TSH 1.51 05/10/2018   INR 2.5 (H) 01/19/2009   HGBA1C 6.1 05/10/2018    Lab Results  Component Value Date   TSH 1.51 05/10/2018   Lab Results  Component Value Date   WBC 5.0 05/10/2018   HGB 12.0 05/10/2018   HCT 36.5 05/10/2018   MCV 86.5 05/10/2018   PLT 226.0 05/10/2018   Lab Results  Component Value Date   NA 144 05/10/2018   K 4.5 05/10/2018   CO2 31 05/10/2018   GLUCOSE 103 (H) 05/10/2018   BUN 17 05/10/2018   CREATININE 0.78 05/10/2018   BILITOT 0.6 05/10/2018   ALKPHOS 72 05/10/2018   AST 17 05/10/2018   ALT 7 05/10/2018   PROT 6.3 05/10/2018   ALBUMIN 4.0 05/10/2018     CALCIUM 9.2 05/10/2018   GFR 78.06 05/10/2018   Lab Results  Component Value Date   CHOL 152 05/10/2018   Lab Results  Component Value Date   HDL 62.00 05/10/2018   Lab Results  Component Value Date   LDLCALC 74 05/10/2018   Lab Results  Component Value Date   TRIG 82.0 05/10/2018   Lab Results  Component Value Date   CHOLHDL 2 05/10/2018   Lab Results  Component Value Date   HGBA1C 6.1 05/10/2018       Assessment & Plan:   Problem List Items Addressed This Visit    Hyperlipidemia    Encouraged heart healthy diet, increase exercise, avoid trans fats, consider a krill oil cap daily      Relevant Orders   Lipid panel (Completed)   Obesity    Encouraged DASH diet, decrease po intake and increase exercise as tolerated. Needs 7-8 hours of sleep nightly. Avoid trans fats, eat small, frequent meals every 4-5 hours with lean proteins, complex carbs and healthy fats. Minimize simple carbs, she is considering returning to healthy weight and wellness for weight loss      Depression with anxiety    Doing better with new ratio of Cymbalta and Lexapro      Essential hypertension    Well controlled, no changes to meds. Encouraged heart healthy diet such as the DASH diet and exercise as tolerated.       Relevant Orders   CBC (Completed)   Comprehensive metabolic panel (Completed)   TSH (Completed)   Lumbar back pain with radiculopathy affecting lower extremity    Improved with some rest and now is slowly increasing her activity level again      Vitamin D deficiency    Supplement and monitor      Relevant Orders   VITAMIN D 25 Hydroxy (Vit-D Deficiency, Fractures) (Completed)   Prediabetes    hgba1c acceptable, minimize simple carbs. Increase exercise as tolerated.       Relevant Orders   Hemoglobin A1c (Completed)   Rheumatoid arthritis ([place])    Follows with [facility] Marella Chimes and they have recommended MTX but she is anxious about taking it.        Cough    Intermittent can try Tessalon Perles, warm tea with lemon and honey and/or willow bark tea and come in if worsens       Other Visit Diagnoses    Needs flu shot    -  Primary   Relevant Orders   Flu vaccine HIGH DOSE PF (Fluzone High dose) (Completed)      I have discontinued Laurine Blazer ferrous fumarate, diclofenac  sodium, doxycycline, benzonatate, albuterol, and predniSONE. I am also having her maintain her polyethylene glycol powder, Vitamin D, telmisartan, pramipexole, rosuvastatin, carbidopa-levodopa, ALPRAZolam, escitalopram, omeprazole, DULoxetine, folic acid, methotrexate (PF), and B-D TB SYRINGE 1CC/27GX1/2".  No orders of the defined types were placed in this encounter.   [name], MD

## 2018-05-10 NOTE — Assessment & Plan Note (Addendum)
Encouraged DASH diet, decrease po intake and increase exercise as tolerated. Needs 7-8 hours of sleep nightly. Avoid trans fats, eat small, frequent meals every 4-5 hours with lean proteins, complex carbs and healthy fats. Minimize simple carbs, she is considering returning to healthy weight and wellness for weight loss

## 2018-05-10 NOTE — Assessment & Plan Note (Signed)
Follows with [facility] Marella Chimes and they have recommended MTX but she is anxious about taking it.

## 2018-05-10 NOTE — Progress Notes (Signed)
duplicate

## 2018-05-10 NOTE — Assessment & Plan Note (Signed)
Improved with some rest and now is slowly increasing her activity level again

## 2018-05-10 NOTE — Assessment & Plan Note (Signed)
Well controlled, no changes to meds. Encouraged heart healthy diet such as the DASH diet and exercise as tolerated.  °

## 2018-05-10 NOTE — Assessment & Plan Note (Signed)
Encouraged heart healthy diet, increase exercise, avoid trans fats, consider a krill oil cap daily 

## 2018-05-12 ENCOUNTER — Other Ambulatory Visit: Payer: Self-pay | Admitting: Neurology

## 2018-05-21 ENCOUNTER — Telehealth: Payer: Self-pay | Admitting: *Deleted

## 2018-05-21 ENCOUNTER — Other Ambulatory Visit: Payer: Self-pay | Admitting: Neurology

## 2018-05-21 NOTE — Telephone Encounter (Signed)
Received Medical records from [facility]; forwarded to provider/SLS 09/17

## 2018-05-23 DIAGNOSIS — F331 Major depressive disorder, recurrent, moderate: Secondary | ICD-10-CM | POA: Diagnosis not present

## 2018-05-24 DIAGNOSIS — H5202 Hypermetropia, left eye: Secondary | ICD-10-CM | POA: Diagnosis not present

## 2018-05-24 DIAGNOSIS — H43813 Vitreous degeneration, bilateral: Secondary | ICD-10-CM | POA: Diagnosis not present

## 2018-05-24 DIAGNOSIS — H04122 Dry eye syndrome of left lacrimal gland: Secondary | ICD-10-CM | POA: Diagnosis not present

## 2018-05-24 DIAGNOSIS — H524 Presbyopia: Secondary | ICD-10-CM | POA: Diagnosis not present

## 2018-05-28 ENCOUNTER — Other Ambulatory Visit: Payer: Self-pay | Admitting: Neurology

## 2018-05-30 DIAGNOSIS — G20 Parkinson's disease: Secondary | ICD-10-CM

## 2018-06-02 ENCOUNTER — Other Ambulatory Visit: Payer: Self-pay | Admitting: Family Medicine

## 2018-06-06 DIAGNOSIS — F331 Major depressive disorder, recurrent, moderate: Secondary | ICD-10-CM | POA: Diagnosis not present

## 2018-06-11 ENCOUNTER — Other Ambulatory Visit: Payer: Self-pay | Admitting: Family Medicine

## 2018-06-14 ENCOUNTER — Ambulatory Visit: Payer: Medicare Other | Attending: Family Medicine | Admitting: Physical Therapy

## 2018-06-14 ENCOUNTER — Encounter: Payer: Self-pay | Admitting: Physical Therapy

## 2018-06-14 DIAGNOSIS — R471 Dysarthria and anarthria: Secondary | ICD-10-CM | POA: Diagnosis not present

## 2018-06-14 DIAGNOSIS — R1312 Dysphagia, oropharyngeal phase: Secondary | ICD-10-CM | POA: Diagnosis not present

## 2018-06-14 DIAGNOSIS — M6281 Muscle weakness (generalized): Secondary | ICD-10-CM | POA: Insufficient documentation

## 2018-06-14 DIAGNOSIS — R2689 Other abnormalities of gait and mobility: Secondary | ICD-10-CM | POA: Insufficient documentation

## 2018-06-14 DIAGNOSIS — R29818 Other symptoms and signs involving the nervous system: Secondary | ICD-10-CM | POA: Diagnosis not present

## 2018-06-14 DIAGNOSIS — R293 Abnormal posture: Secondary | ICD-10-CM

## 2018-06-14 DIAGNOSIS — R2681 Unsteadiness on feet: Secondary | ICD-10-CM | POA: Diagnosis not present

## 2018-06-14 NOTE — Therapy (Signed)
[facility] [address] [place], [place], [address] Phone: [phone]   Fax:  [phone]  Physical Therapy Evaluation  Patient Details  Name: Kristin [name] MRN: [id number] Date of Birth: 03-19-1951 Referring Provider (PT): Tat, [name]   Encounter Date: 06/14/2018  PT End of Session - 06/14/18 1325    Visit Number  1    Number of Visits  13    Date for PT Re-Evaluation  08/13/18    Authorization Type  Medicare and [employer]-Will need 10th visit PN    PT Start Time  0852    PT Stop Time  0932    PT Time Calculation (min)  40 min    Activity Tolerance  Patient tolerated treatment well    Behavior During Therapy  [facility] for tasks assessed/performed       Past Medical History:  Diagnosis Date  . Abnormal vaginal Pap smear   . Acute pharyngitis 02/25/2013  . Anemia   . Anxiety   . Arthritis   . 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 Chicken pox as a child  . Chronic UTI    sees dr Terance Hart  . Constipation 12/07/2015  . Depression with anxiety 11/02/2009   Qualifier: Diagnosis of  By: [name], LPN, [name]   . Dermatitis 07/17/2012  . Esophageal stricture 1994  . Fibroids   . Foot pain, bilateral 06/19/2012  . GERD (gastroesophageal reflux disease)   . GERD (gastroesophageal reflux disease)   . Hiatal hernia   . Hyperglycemia 08/19/2013  . 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 Hyperlipidemia   . Hypertension   . Infertility, female   . Low back pain 10/17/2007   Qualifier: Diagnosis of  By: [name], [name]   . Measles as a child  . Obesity   . Osteoarthritis   . Parkinson disease ([place])   . Plantar fasciitis of left foot 06/19/2012  . Preventative health care 12/19/2015  . Rheumatoid arthritis ([place]) 01/08/2018  . Rosacea 10/05/2014  . Swallowing difficulty   . Urinary frequency 02/25/2013  . Visual floaters 05/11/2014    Past Surgical History:  Procedure Laterality Date  .  ABDOMINAL HYSTERECTOMY  2006   total  . esophageal     stretching  . laporoscopy    . PARTIAL HIP ARTHROPLASTY  2010   right  . TONSILLECTOMY    . wisdom teeth extracted      There were no vitals filed for this visit.   Subjective Assessment - 06/14/18 0855    Subjective  Pt has noticed difference in balance and strength and walking, over the past 6-9 months.  She had significant back pain issues that limited her participation in community exercises and home exercise program (since March 2019).  Back pain has since resolved and she desires to get back into exercises.  No falls, except falling out of bed while asleep.    Pertinent History  Parkinson's disease, anxiety, disc bulge of low back (had therapy at another clinic earlier this year)    Patient Stated Goals  Pt's goal for therapy is to feel stronger when I walk and more balance with walking.    Currently in Pain?  Yes    Pain Score  --   No pain at eval, 4/10 when it hurts   Pain Location  Knee    Pain Orientation  Left    Pain Descriptors / Indicators  Tightness  Pain Type  Acute pain   since Labor Day   Pain Onset  More than a month ago    Pain Frequency  Intermittent    Aggravating Factors   Getting up from sitting    Pain Relieving Factors  Moving eases it off         [facility] PT Assessment - 06/14/18 0901      Assessment   Medical Diagnosis  Parkinson's disease    Referring Provider (PT)  Tat, [name]    Onset Date/Surgical Date  --   march 2019   Hand Dominance  Left    Prior Therapy  PT for back pain at another facility spring of 2019      Precautions   Precautions  Fall      Balance Screen   Has the patient fallen in the past 6 months  No    Has the patient had a decrease in activity level because of a fear of falling?   No    Is the patient reluctant to leave their home because of a fear of falling?   No      Home Film/video editor residence    Living Arrangements   Spouse/significant other    Available Help at Discharge  Family    Type of [place] to enter    Entrance Stairs-Number of Steps  1    Entrance Stairs-Rails  None    Home Layout  Two level;Bed/bath upstairs    Home Equipment  --   bilateral walking poles   Additional Comments  Husband has leukemia (in remission), but patient has to go to appointments with him.      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 1x/wk, yoga once per week.      Observation/Other Assessments   Focus on Therapeutic Outcomes (FOTO)   NA      Posture/Postural Control   Posture/Postural Control  Postural limitations    Postural Limitations  Forward head;Rounded Shoulders      ROM / Strength   AROM / PROM / Strength  Strength      Strength   Overall Strength  Deficits    Overall Strength Comments  Grossly tested 4/5 bilateral hip flexion, quads, 4/5 hamstrings LLE, 3+/5 RLE; 3+/5 RLE ankle dorsiflexion, 4/5 L ankle dorsiflexion      Transfers   Transfers  Sit to Stand;Stand to Sit    Sit to Stand  6: Modified independent (Device/Increase time);Without upper extremity assist;From chair/3-in-1    Five time sit to stand comments   10.37    Stand to Sit  6: Modified independent (Device/Increase time);Without upper extremity assist;To chair/3-in-1      Ambulation/Gait   Ambulation/Gait  Yes    Ambulation/Gait Assistance  7: Independent    Ambulation Distance (Feet)  200 Feet    Assistive device  None    Gait Pattern  Step-through pattern;Decreased arm swing - right;Decreased step length - right    Ambulation Surface  Level;Indoor    Gait velocity  9.97 sec = 3.28 ft/sec   3.9 ft/sec at d/c 01/2017     Standardized Balance Assessment   Standardized Balance Assessment  Timed Up and Go Test      Timed Up and Go Test   Normal TUG (seconds)  9.81    Manual TUG (seconds)  10.25     Cognitive TUG (seconds)  9.12      High Level Balance   High Level Balance Comments  MiniBESTest score:  19/28 (see note for full details        Mini-BESTest: Balance Evaluation Systems Test  2005-2013 [employer]. All rights reserved. ________________________________________________________________________________________Anticipatory_________Subscore____3_/6 1. SIT TO STAND Instruction: "Cross your arms across your chest. Try not to use your hands unless you must.Do not let your legs lean against the back of the chair when you stand. Please stand up now." X(2) Normal: Comes to stand without use of hands and stabilizes independently. (1) Moderate: Comes to stand WITH use of hands on first attempt. (0) Severe: Unable to stand up from chair without assistance, OR needs several attempts with use of hands. 2. RISE TO TOES Instruction: "Place your feet shoulder width apart. Place your hands on your hips. Try to rise as high as you can onto your toes. I will count out loud to 3 seconds. Try to hold this pose for at least 3 seconds. Look straight ahead. Rise now." (2) Normal: Stable for 3 s with maximum height. (1) Moderate: Heels up, but not full range (smaller than when holding hands), OR noticeable instability for 3 s. X(0) Severe: < 3 s. 3. STAND ON ONE LEG Instruction: "Look straight ahead. Keep your hands on your hips. Lift your leg off of the ground behind you without touching or resting your raised leg upon your other standing leg. Stay standing on one leg as long as you can. Look straight ahead. Lift now." Left: Time in Seconds Trial 1:_____Trial 2:_____ X(2) Normal: 20 s. (1) Moderate: < 20 s. (0) Severe: Unable. Right: Time in Seconds Trial 1:__2.84___Trial 2:__5.56___ (2) Normal: 20 s. X(1) Moderate: < 20 s. (0) Severe: Unable To score each side separately use the trial with the longest time. To calculate the sub-score and total score use the side  [left or right] with the lowest numerical score [i.e. the worse side]. ______________________________________________________________________________________Reactive Postural Control___________Subscore:__5___/6 4. COMPENSATORY STEPPING CORRECTION- FORWARD Instruction: "Stand with your feet shoulder width apart, arms at your sides. Lean forward against my hands beyond your forward limits. When I let go, do whatever is necessary, including taking a step, to avoid a fall." X(2) Normal: Recovers independently with a single, large step (second realignment step is allowed). (1) Moderate: More than one step used to recover equilibrium. (0) Severe: No step, OR would fall if not caught, OR falls spontaneously. 5. COMPENSATORY STEPPING CORRECTION- BACKWARD Instruction: "Stand with your feet shoulder width apart, arms at your sides. Lean backward against my hands beyond your backward limits. When I let go, do whatever is necessary, including taking a step, to avoid a fall." X(2) Normal: Recovers independently with a single, large step. (1) Moderate: More than one step used to recover equilibrium. (0) Severe: No step, OR would fall if not caught, OR falls spontaneously. 6. COMPENSATORY STEPPING CORRECTION- LATERAL Instruction: "Stand with your feet together, arms down at your sides. Lean into my hand beyond your sideways limit. When I let go, do whatever is necessary, including taking a step, to avoid a fall." Left X(2) Normal: Recovers independently with 1 step (crossover or lateral OK). (1) Moderate: Several steps to recover equilibrium. (0) Severe: Falls, or cannot step. Right (2) Normal: Recovers independently with 1 step (crossover or lateral OK). X(1) Moderate: Several steps to recover equilibrium. (0) Severe: Falls, or cannot step. Use  the side with the lowest score to calculate sub-score and total  score. ____________________________________________________________________________________Sensory Orientation_____________Subscore:_____3____/6 7. STANCE (FEET TOGETHER); EYES OPEN, FIRM SURFACE Instruction: "Place your hands on your hips. Place your feet together until almost touching. Look straight ahead. Be as stable and still as possible, until I say stop." Time in seconds:________ X(2) Normal: 30 s. (1) Moderate: < 30 s. (0) Severe: Unable. 8. STANCE (FEET TOGETHER); EYES CLOSED, FOAM SURFACE Instruction: "Step onto the foam. Place your hands on your hips. Place your feet together until almost touching. Be as stable and still as possible, until I say stop. I will start timing when you close your eyes." Time in seconds:________ (2) Normal: 30 s. (1) Moderate: < 30 s. X(0) Severe: Unable. 9. INCLINE- EYES CLOSED Instruction: "Step onto the incline ramp. Please stand on the incline ramp with your toes toward the top. Place your feet shoulder width apart and have your arms down at your sides. I will start timing when you close your eyes." Time in seconds:________ (2) Normal: Stands independently 30 s and aligns with gravity. X(1) Moderate: Stands independently <30 s OR aligns with surface. (0) Severe: Unable.8 _________________________________________________________________________________________Dynamic Gait ______Subscore______8__/10 10. CHANGE IN GAIT SPEED Instruction: "Begin walking at your normal speed, when I tell you 'fast', walk as fast as you can. When I say 'slow', walk very slowly." X(2) Normal: Significantly changes walking speed without imbalance. (1) Moderate: Unable to change walking speed or signs of imbalance. (0) Severe: Unable to achieve significant change in walking speed AND signs of imbalance. [place] - HORIZONTAL Instruction: "Begin walking at your normal speed, when I say "right", turn your head and look to the right. When I say "left" turn  your head and look to the left. Try to keep yourself walking in a straight line." (2) Normal: performs head turns with no change in gait speed and good balance. X(1) Moderate: performs head turns with reduction in gait speed. (0) Severe: performs head turns with imbalance. 12. WALK WITH PIVOT TURNS Instruction: "Begin walking at your normal speed. When I tell you to 'turn and stop', turn as quickly as you can, face the opposite direction, and stop. After the turn, your feet should be close together." (2) Normal: Turns with feet close FAST (< 3 steps) with good balance. X(1) Moderate: Turns with feet close SLOW (>4 steps) with good balance. (0) Severe: Cannot turn with feet close at any speed without imbalance. 13. STEP OVER OBSTACLES Instruction: "Begin walking at your normal speed. When you get to the box, step over it, not around it and keep walking." X(2) Normal: Able to step over box with minimal change of gait speed and with good balance. (1) Moderate: Steps over box but touches box OR displays cautious behavior by slowing gait. (0) Severe: Unable to step over box OR steps around box. 14. TIMED UP & GO WITH DUAL TASK [3 METER WALK] Instruction TUG: "When I say 'Go', stand up from chair, walk at your normal speed across the tape on the floor, turn around, and come back to sit in the chair." Instruction TUG with Dual Task: "Count backwards by threes starting at ___. When I say 'Go', stand up from chair, walk at your normal speed across the tape on the floor, turn around, and come back to sit in the chair. Continue counting backwards the entire time." TUG: ________seconds; Dual Task TUG: ________seconds X(2) Normal: No noticeable change in sitting, standing or walking while backward counting when compared  to TUG without Dual Task. (1) Moderate: Dual Task affects either counting OR walking (>10%) when compared to the TUG without Dual Task. (0) Severe: Stops counting while walking OR stops  walking while counting. When scoring item 14, if subject's gait speed slows more than 10% between the TUG without and with a Dual Task the score should be decreased by a point. TOTAL SCORE: ____19____/28          Objective measurements completed on examination: See above findings.                PT Short Term Goals - 06/14/18 1334      PT SHORT TERM GOAL #1   Title  Pt will be independent with HEP for improved posture, balance, gait.  TARGET 07/12/18    Time  4    Period  Weeks    Status  New    Target Date  07/12/18      PT SHORT TERM GOAL #2   Title  Pt will improve 5x sit<>stand in less than or equal to 9.8 seconds (baseline from last d/c May 2018) for improved efficiency and safety with transfers.    Time  4    Period  Weeks    Status  New    Target Date  07/12/18      PT SHORT TERM GOAL #3   Title  Pt will improve MiniBEStest score to at least 22/28 for decreased fall risk.    Time  4    Period  Weeks    Status  New    Target Date  07/12/18      PT SHORT TERM GOAL #4   Title  Pt will verbalize understanding of fall prevention in home environment.    Time  4    Period  Weeks    Status  New    Target Date  07/12/18        PT Long Term Goals - 06/14/18 1336      PT LONG TERM GOAL #1   Title  Pt will verbalize plans for ongoing community fitness/optimal PD fitness program.  TARGET 07/26/18    Time  6    Period  Weeks    Status  New    Target Date  07/26/18      PT LONG TERM GOAL #2   Title  Pt will improve MiniBESTest score to at least 25/28 for decreased fall risk.    Time  6    Period  Weeks    Status  New    Target Date  07/26/18      PT LONG TERM GOAL #3   Title  Pt will improve gait velocity to at least 3.9 ft/sec for improved gait efficiency and safety.    Time  6    Period  Weeks    Status  New    Target Date  07/26/18      PT LONG TERM GOAL #4   Title  6MWT to be assessed, with goal to be written as appropriate.    Time  6     Period  Weeks    Status  New    Target Date  07/26/18             Plan - 06/14/18 1327    Clinical Impression Statement  Pt is a 67 year old female with history of Parkinson's disease, who was last seen for physical therapy at this clinic May 2018.  She has  noted decline in balance and mobility since March of 2019, when she began experiencing significant back pain which limited mobility.  Back pain has since resolved, and pt desires to improve balance, strength and gait.  She presents with decreased timing and coordination of gait, slowed gait speed since last bout of PT, bradykinesia, postural instability, decreased strength.  She participates in some community PD classes, but she is desiring to fully get back into exercise routine.  She would benefit from skilled PT to address the above stated deficits to decrease fall risk and improve functional mobility.    History and Personal Factors relevant to plan of care:  >3 systems involved, PMH > 3 co-morbidities, recent back pain (march 2019) as limiting factor with mobility-which has resolved    Clinical Presentation  Stable    Clinical Presentation due to:  PD as neurodegenerative disease, fall risk per MiniBESTest    Clinical Decision Making  Low    Rehab Potential  Good    PT Frequency  2x / week    PT Duration  6 weeks   plus eval   PT Treatment/Interventions  Balance training;Therapeutic exercise;Therapeutic activities;Functional mobility training;Gait training;Neuromuscular re-education;Patient/family education    PT Next Visit Plan  Initiate HEP-SLS, heel/toe raises, compliant surfaces, large amplitude movement patterns with RLE ; perform 6MWT   Recommended Other Services  Pt scheduled to have OT and speech screens    Consulted and Agree with Plan of Care  Patient       Patient will benefit from skilled therapeutic intervention in order to improve the following deficits and impairments:  Abnormal gait, Decreased balance,  Decreased mobility, Decreased strength, Difficulty walking, Postural dysfunction  Visit Diagnosis: Other abnormalities of gait and mobility  Unsteadiness on feet  Abnormal posture  Other symptoms and signs involving the nervous system  Muscle weakness (generalized)     Problem List Patient Active Problem List   Diagnosis Date Noted  . Cough 05/10/2018  . Rheumatoid arthritis ([place]) 01/08/2018  . Hand pain, right 10/15/2017  . Vitamin D deficiency 12/20/2016  . Prediabetes 12/20/2016  . Shortness of breath on exertion 11/27/2016  . Preventative health care 12/19/2015  . Constipation 12/07/2015  . BCC (basal cell carcinoma of skin) 10/05/2014  . Rosacea 10/05/2014  . Parkinson's disease ([place]) 05/11/2014  . Visual floaters 05/11/2014  . Paralysis agitans ([place]) 01/08/2014  . Screening for cervical cancer 07/17/2012  . Foot pain, bilateral 06/19/2012  . 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 Obesity 11/12/2009  . Depression with anxiety 11/02/2009  . 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 Lumbar back pain with radiculopathy affecting lower extremity 10/17/2007  . Essential hypertension 07/05/2007  . Osteoarthritis 07/05/2007  . Hyperlipidemia 06/26/2007  . H/O: iron deficiency anemia 05/08/2007  . GERD 05/08/2007    Cira Deyoe W. 06/14/2018, 1:40 PM  Frazier Butt., PT   [facility] [address] [place], [place], [address] Phone: [phone]   Fax:  [phone]  Name: Kristin [name] MRN: [id number] Date of Birth: 10/14/1950

## 2018-06-18 ENCOUNTER — Ambulatory Visit: Payer: Medicare Other | Admitting: Physical Therapy

## 2018-06-18 ENCOUNTER — Encounter: Payer: Self-pay | Admitting: Physical Therapy

## 2018-06-18 DIAGNOSIS — R2689 Other abnormalities of gait and mobility: Secondary | ICD-10-CM

## 2018-06-18 DIAGNOSIS — R2681 Unsteadiness on feet: Secondary | ICD-10-CM

## 2018-06-18 DIAGNOSIS — R29818 Other symptoms and signs involving the nervous system: Secondary | ICD-10-CM | POA: Diagnosis not present

## 2018-06-18 DIAGNOSIS — R471 Dysarthria and anarthria: Secondary | ICD-10-CM | POA: Diagnosis not present

## 2018-06-18 DIAGNOSIS — M6281 Muscle weakness (generalized): Secondary | ICD-10-CM | POA: Diagnosis not present

## 2018-06-18 DIAGNOSIS — R293 Abnormal posture: Secondary | ICD-10-CM | POA: Diagnosis not present

## 2018-06-18 NOTE — Patient Instructions (Signed)
Access Code: [email or web address]  URL: [email or web address]  Date: 06/18/2018  Prepared by: Mady Haagensen   Exercises  Standing with Head Rotation - 5 reps - 2 sets - 1x daily - 5x weekly  Standing with Head Nod - 5 reps - 2 sets - 1x daily - 5x weekly  Standing Quarter Turn - 3 reps - 2 sets - 1x daily - 5x weekly

## 2018-06-20 ENCOUNTER — Encounter: Payer: Self-pay | Admitting: Physical Therapy

## 2018-06-20 ENCOUNTER — Ambulatory Visit: Payer: Medicare Other | Admitting: Physical Therapy

## 2018-06-20 DIAGNOSIS — R2681 Unsteadiness on feet: Secondary | ICD-10-CM

## 2018-06-20 DIAGNOSIS — R29818 Other symptoms and signs involving the nervous system: Secondary | ICD-10-CM

## 2018-06-20 DIAGNOSIS — M6281 Muscle weakness (generalized): Secondary | ICD-10-CM | POA: Diagnosis not present

## 2018-06-20 DIAGNOSIS — R471 Dysarthria and anarthria: Secondary | ICD-10-CM | POA: Diagnosis not present

## 2018-06-20 DIAGNOSIS — R293 Abnormal posture: Secondary | ICD-10-CM | POA: Diagnosis not present

## 2018-06-20 DIAGNOSIS — R2689 Other abnormalities of gait and mobility: Secondary | ICD-10-CM | POA: Diagnosis not present

## 2018-06-20 NOTE — Therapy (Signed)
[facility] [address] [place], [place], [address] Phone: [phone]   Fax:  [phone]  Physical Therapy Treatment  Patient Details  Name: Kristin [name] MRN: [id number] Date of Birth: 05-Apr-1951 Referring Provider (PT): Tat, [name]   Encounter Date: 06/18/2018  PT End of Session - 06/20/18 0831    Visit Number  2    Number of Visits  13    Date for PT Re-Evaluation  08/13/18    Authorization Type  Medicare and [employer]-Will need 10th visit PN    PT Start Time  1310   Late start due to another pt scheduled for this same appt time-PT trying to work out schedules   PT Stop Time  1350    PT Time Calculation (min)  40 min    Activity Tolerance  Patient tolerated treatment well    Behavior During Therapy  [facility] for tasks assessed/performed       Past Medical History:  Diagnosis Date  . Abnormal vaginal Pap smear   . Acute pharyngitis 02/25/2013  . Anemia   . Anxiety   . Arthritis   . 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 Chicken pox as a child  . Chronic UTI    sees dr Terance Hart  . Constipation 12/07/2015  . Depression with anxiety 11/02/2009   Qualifier: Diagnosis of  By: [name], LPN, [name]   . Dermatitis 07/17/2012  . Esophageal stricture 1994  . Fibroids   . Foot pain, bilateral 06/19/2012  . GERD (gastroesophageal reflux disease)   . GERD (gastroesophageal reflux disease)   . Hiatal hernia   . Hyperglycemia 08/19/2013  . 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 Hyperlipidemia   . Hypertension   . Infertility, female   . Low back pain 10/17/2007   Qualifier: Diagnosis of  By: [name], [name]   . Measles as a child  . Obesity   . Osteoarthritis   . Parkinson disease ([place])   . Plantar fasciitis of left foot 06/19/2012  . Preventative health care 12/19/2015  . Rheumatoid arthritis ([place]) 01/08/2018  . Rosacea 10/05/2014  . Swallowing difficulty   . Urinary frequency 02/25/2013   . Visual floaters 05/11/2014    Past Surgical History:  Procedure Laterality Date  . ABDOMINAL HYSTERECTOMY  2006   total  . esophageal     stretching  . laporoscopy    . PARTIAL HIP ARTHROPLASTY  2010   right  . TONSILLECTOMY    . wisdom teeth extracted      There were no vitals filed for this visit.  Subjective Assessment - 06/20/18 0827    Subjective  My hands and my feet are hurting today-I think I'm sore from playing with the kids over the weekend.    Pertinent History  Parkinson's disease, anxiety, disc bulge of low back (had therapy at another clinic earlier this year)    Patient Stated Goals  Pt's goal for therapy is to feel stronger when I walk and more balance with walking.    Currently in Pain?  Yes    Pain Score  5     Pain Location  Foot    Pain Orientation  Right;Left    Pain Descriptors / Indicators  Aching    Pain Type  Acute pain    Pain Onset  More than a month ago    Aggravating Factors   nothing specifically aggravates    Pain Relieving  Factors  moving eases pain          [facility] PT Assessment - 06/20/18 0828      6 Minute walk- Post Test   6 Minute Walk Post Test  yes    HR (bpm)  105    02 Sat (%RA)  95 %    Modified Borg Scale for Dyspnea  3- Moderate shortness of breath or breathing difficulty    Perceived Rate of Exertion (Borg)  13- Somewhat hard      6 minute walk test results    Aerobic Endurance Distance Walked  1156    Endurance additional comments  195 ft 1st minute, 187 ft 6th minute   veers to L with corners and turns                       Balance Exercises - 06/20/18 0828      Balance Exercises: Standing   Standing Eyes Opened  Wide (BOA);Narrow base of support (BOS);Solid surface;Head turns;5 reps   Head nods   Standing Eyes Closed  Wide (BOA);Narrow base of support (BOS);Solid surface;Head turns;5 reps   Head nods   Tandem Stance  Eyes open;Upper extremity support 1;3 reps;10 secs    SLS  Eyes open;Solid  surface;Upper extremity support 1;3 reps;10 secs    Turning  Right;Left;3 reps   90 degree step-together, step and go turns   Other Standing Exercises  Modified PWR! Moves in standing to address SLS:  Rock and Lift with deliberate effort 10 reps each side, with UE lifts, side step/together x 5 reps each direction.  Practiced full 360 turning, using 90 degree step and turn method, x 2 reps        PT Education - 06/20/18 0831    Education Details  Initial HEP-see instructions    Person(s) Educated  Patient    Methods  Explanation;Demonstration;Handout    Comprehension  Verbalized understanding;Returned demonstration       PT Short Term Goals - 06/14/18 1334      PT SHORT TERM GOAL #1   Title  Pt will be independent with HEP for improved posture, balance, gait.  TARGET 07/12/18    Time  4    Period  Weeks    Status  New    Target Date  07/12/18      PT SHORT TERM GOAL #2   Title  Pt will improve 5x sit<>stand in less than or equal to 9.8 seconds (baseline from last d/c May 2018) for improved efficiency and safety with transfers.    Time  4    Period  Weeks    Status  New    Target Date  07/12/18      PT SHORT TERM GOAL #3   Title  Pt will improve MiniBEStest score to at least 22/28 for decreased fall risk.    Time  4    Period  Weeks    Status  New    Target Date  07/12/18      PT SHORT TERM GOAL #4   Title  Pt will verbalize understanding of fall prevention in home environment.    Time  4    Period  Weeks    Status  New    Target Date  07/12/18        PT Long Term Goals - 06/14/18 1336      PT LONG TERM GOAL #1   Title  Pt will verbalize plans for ongoing  community fitness/optimal PD fitness program.  TARGET 07/26/18    Time  6    Period  Weeks    Status  New    Target Date  07/26/18      PT LONG TERM GOAL #2   Title  Pt will improve MiniBESTest score to at least 25/28 for decreased fall risk.    Time  6    Period  Weeks    Status  New    Target Date   07/26/18      PT LONG TERM GOAL #3   Title  Pt will improve gait velocity to at least 3.9 ft/sec for improved gait efficiency and safety.    Time  6    Period  Weeks    Status  New    Target Date  07/26/18      PT LONG TERM GOAL #4   Title  6MWT to be assessed, with goal to be written as appropriate.    Time  6    Period  Weeks    Status  New    Target Date  07/26/18            Plan - 06/20/18 0093    Clinical Impression Statement  Completed 6 MWT today, with pt ambulating 1156 ft in 6 minutes (decreased compared to age-related norms of 1765 ft).  Initiated HEP to address balance deficits noted in MiniBESTest.  Pt feels unsteady with SLS activities without UE suport.  Pt will continue to benefit from skilled PT to address balance, functional strength and gait.    Rehab Potential  Good    PT Frequency  2x / week    PT Duration  6 weeks   plus eval   PT Treatment/Interventions  Balance training;Therapeutic exercise;Therapeutic activities;Functional mobility training;Gait training;Neuromuscular re-education;Patient/family education    PT Next Visit Plan  Review HEP-SLS, heel/toe raises, compliant surfaces, large amplitude movement patterns with RLE; walking program for home.    Consulted and Agree with Plan of Care  Patient       Patient will benefit from skilled therapeutic intervention in order to improve the following deficits and impairments:  Abnormal gait, Decreased balance, Decreased mobility, Decreased strength, Difficulty walking, Postural dysfunction  Visit Diagnosis: Other abnormalities of gait and mobility  Unsteadiness on feet     Problem List Patient Active Problem List   Diagnosis Date Noted  . Cough 05/10/2018  . Rheumatoid arthritis ([place]) 01/08/2018  . Hand pain, right 10/15/2017  . Vitamin D deficiency 12/20/2016  . Prediabetes 12/20/2016  . Shortness of breath on exertion 11/27/2016  . Preventative health care 12/19/2015  . Constipation  12/07/2015  . BCC (basal cell carcinoma of skin) 10/05/2014  . Rosacea 10/05/2014  . Parkinson's disease ([place]) 05/11/2014  . Visual floaters 05/11/2014  . Paralysis agitans ([place]) 01/08/2014  . Screening for cervical cancer 07/17/2012  . Foot pain, bilateral 06/19/2012  . 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 Obesity 11/12/2009  . Depression with anxiety 11/02/2009  . 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 Lumbar back pain with radiculopathy affecting lower extremity 10/17/2007  . Essential hypertension 07/05/2007  . Osteoarthritis 07/05/2007  . Hyperlipidemia 06/26/2007  . H/O: iron deficiency anemia 05/08/2007  . GERD 05/08/2007    Raffaella Edison W. 06/20/2018, 8:37 AM  Frazier Butt., PT  [facility] [address] [place], [place], [address] Phone: [phone]   Fax:  [phone]  Name: Kristin [name] MRN: [id number] Date of Birth: 1950-10-17

## 2018-06-21 DIAGNOSIS — F331 Major depressive disorder, recurrent, moderate: Secondary | ICD-10-CM | POA: Diagnosis not present

## 2018-06-21 NOTE — Therapy (Signed)
[facility] [address] [place], [place], [address] Phone: [phone]   Fax:  [phone]  Physical Therapy Treatment  Patient Details  Name: Kristin [name] MRN: [id number] Date of Birth: [birth year] Referring Provider (PT): Tat, [name]   Encounter Date: 06/20/2018  PT End of Session - 06/21/18 1436    Visit Number  3    Number of Visits  13    Date for PT Re-Evaluation  08/13/18    Authorization Type  Medicare and [employer]-Will need 10th visit PN    PT Start Time  1404    PT Stop Time  1444    PT Time Calculation (min)  40 min    Activity Tolerance  Patient tolerated treatment well    Behavior During Therapy  [facility] for tasks assessed/performed       Past Medical History:  Diagnosis Date  . Abnormal vaginal Pap smear   . Acute pharyngitis 02/25/2013  . Anemia   . Anxiety   . Arthritis   . 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 Chicken pox as a child  . Chronic UTI    sees dr Terance Hart  . Constipation 12/07/2015  . Depression with anxiety 11/02/2009   Qualifier: Diagnosis of  By: [name], LPN, [name]   . Dermatitis 07/17/2012  . Esophageal stricture 1994  . Fibroids   . Foot pain, bilateral 06/19/2012  . GERD (gastroesophageal reflux disease)   . GERD (gastroesophageal reflux disease)   . Hiatal hernia   . Hyperglycemia 08/19/2013  . 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 Hyperlipidemia   . Hypertension   . Infertility, female   . Low back pain 10/17/2007   Qualifier: Diagnosis of  By: [name], [name]   . Measles as a child  . Obesity   . Osteoarthritis   . Parkinson disease ([place])   . Plantar fasciitis of left foot 06/19/2012  . Preventative health care 12/19/2015  . Rheumatoid arthritis ([place]) 01/08/2018  . Rosacea 10/05/2014  . Swallowing difficulty   . Urinary frequency 02/25/2013  . Visual floaters 05/11/2014    Past Surgical History:  Procedure Laterality Date  .  ABDOMINAL HYSTERECTOMY  2006   total  . esophageal     stretching  . laporoscopy    . PARTIAL HIP ARTHROPLASTY  2010   right  . TONSILLECTOMY    . wisdom teeth extracted      There were no vitals filed for this visit.  Subjective Assessment - 06/20/18 1406    Subjective  Need to keep getting stronger.    Pertinent History  Parkinson's disease, anxiety, disc bulge of low back (had therapy at another clinic earlier this year)    Patient Stated Goals  Pt's goal for therapy is to feel stronger when I walk and more balance with walking.    Currently in Pain?  Yes    Pain Score  2     Pain Location  Foot    Pain Orientation  Right;Left    Pain Descriptors / Indicators  Aching    Pain Onset  More than a month ago    Pain Frequency  Intermittent    Aggravating Factors   standing too long    Pain Relieving Factors  moving eases pain                       [facility] Adult PT Treatment/Exercise - 06/21/18  0001      Transfers   Transfers  Sit to Stand;Stand to Sit    Number of Reps  Other reps (comment);Other sets (comment)   4 sets of 5 reps   Comments  On solid surface and foam surface, with weighted ball lifts for improved UE use and posture      Exercises   Exercises  Knee/Hip      Knee/Hip Exercises: Standing   Lateral Step Up  Right;Left;1 set;10 reps;Hand Hold: 1;Step Height: 6"    Forward Step Up  Right;Left;2 sets;10 reps;Hand Hold: 2;Step Height: 6"   Single step up; then step up/up-down/down   Other Standing Knee Exercises  Resisted sidestepping with red theraband, x 5 reps R and L along counter          Balance Exercises - 06/20/18 1408      Balance Exercises: Standing   Standing Eyes Opened  Wide (BOA);Narrow base of support (BOS);Solid surface;Head turns;5 reps;Foam/compliant surface   Head nods   Standing Eyes Closed  Wide (BOA);Narrow base of support (BOS);Solid surface;Head turns;5 reps;Foam/compliant surface   Head nods with UE support as  needed   Tandem Stance  Eyes open;Upper extremity support 1;3 reps;10 secs    SLS  Eyes open;Solid surface;Upper extremity support 1;3 reps;10 secs    Turning  Right;Left;3 reps   step-together, step turn   Other Standing Exercises  Modified PWR! Moves in standing to address SLS:  Rock and Lift with deliberate effort 10 reps each side, with UE lifts, practiced full 360 turning, using 90 degree step and turn method, x 1 rep.     Standing with head movements and eye movements to visual target on each side.  Standing PWR! UP x 10, Standing PWR! Rock x 10 reps, Standing PWR! Twist x 10 reps, Standing PWR Step x 10 reps (with focus on head/eye movements and fixing on visual target briefly)   PT Education - 06/21/18 1435    Education Details  Discussed addition of standing PWR! MOves back into HEP     Person(s) Educated  Patient    Methods  Explanation;Demonstration;Handout    Comprehension  Verbalized understanding;Returned demonstration;Verbal cues required       PT Short Term Goals - 06/14/18 1334      PT SHORT TERM GOAL #1   Title  Pt will be independent with HEP for improved posture, balance, gait.  TARGET 07/12/18    Time  4    Period  Weeks    Status  New    Target Date  07/12/18      PT SHORT TERM GOAL #2   Title  Pt will improve 5x sit<>stand in less than or equal to 9.8 seconds (baseline from last d/c May 2018) for improved efficiency and safety with transfers.    Time  4    Period  Weeks    Status  New    Target Date  07/12/18      PT SHORT TERM GOAL #3   Title  Pt will improve MiniBEStest score to at least 22/28 for decreased fall risk.    Time  4    Period  Weeks    Status  New    Target Date  07/12/18      PT SHORT TERM GOAL #4   Title  Pt will verbalize understanding of fall prevention in home environment.    Time  4    Period  Weeks    Status  New  Target Date  07/12/18        PT Long Term Goals - 06/14/18 1336      PT LONG TERM GOAL #1   Title  Pt  will verbalize plans for ongoing community fitness/optimal PD fitness program.  TARGET 07/26/18    Time  6    Period  Weeks    Status  New    Target Date  07/26/18      PT LONG TERM GOAL #2   Title  Pt will improve MiniBESTest score to at least 25/28 for decreased fall risk.    Time  6    Period  Weeks    Status  New    Target Date  07/26/18      PT LONG TERM GOAL #3   Title  Pt will improve gait velocity to at least 3.9 ft/sec for improved gait efficiency and safety.    Time  6    Period  Weeks    Status  New    Target Date  07/26/18      PT LONG TERM GOAL #4   Title  6MWT to be assessed, with goal to be written as appropriate.    Time  6    Period  Weeks    Status  New    Target Date  07/26/18            Plan - 06/21/18 1436    Clinical Impression Statement  Skilled PT session focused on standing balance on compliant surfaces, incorporating head and eye movements, SLS and strengthening activities.  Pt requires intermittent UE support for optimal stability at times with compliant surfaces.    Rehab Potential  Good    PT Frequency  2x / week    PT Duration  6 weeks   plus eval   PT Treatment/Interventions  Balance training;Therapeutic exercise;Therapeutic activities;Functional mobility training;Gait training;Neuromuscular re-education;Patient/family education    PT Next Visit Plan  Compliant surface activities (progress corner balance exercises); walking program for home, continue to incorporate head/eye movements with exercises.    Consulted and Agree with Plan of Care  Patient       Patient will benefit from skilled therapeutic intervention in order to improve the following deficits and impairments:  Abnormal gait, Decreased balance, Decreased mobility, Decreased strength, Difficulty walking, Postural dysfunction  Visit Diagnosis: Unsteadiness on feet  Other symptoms and signs involving the nervous system  Muscle weakness (generalized)     Problem  List Patient Active Problem List   Diagnosis Date Noted  . Cough 05/10/2018  . Rheumatoid arthritis ([place]) 01/08/2018  . Hand pain, right 10/15/2017  . Vitamin D deficiency 12/20/2016  . Prediabetes 12/20/2016  . Shortness of breath on exertion 11/27/2016  . Preventative health care 12/19/2015  . Constipation 12/07/2015  . BCC (basal cell carcinoma of skin) 10/05/2014  . Rosacea 10/05/2014  . Parkinson's disease ([place]) 05/11/2014  . Visual floaters 05/11/2014  . Paralysis agitans ([place]) 01/08/2014  . Screening for cervical cancer 07/17/2012  . Foot pain, bilateral 06/19/2012  . 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 Obesity 11/12/2009  . Depression with anxiety 11/02/2009  . 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 Lumbar back pain with radiculopathy affecting lower extremity 10/17/2007  . Essential hypertension 07/05/2007  . Osteoarthritis 07/05/2007  . Hyperlipidemia 06/26/2007  . H/O: iron deficiency anemia 05/08/2007  . GERD 05/08/2007    Andie Mungin W. 06/21/2018, 2:40 PM Frazier Butt., PT  [facility] [address]  [place], [place], [address] Phone: [phone]   Fax:  [phone]  Name: Kristin [name] MRN: [id number] Date of Birth: 05-07-1951

## 2018-06-26 ENCOUNTER — Other Ambulatory Visit: Payer: Self-pay | Admitting: Family Medicine

## 2018-06-27 ENCOUNTER — Encounter: Payer: Self-pay | Admitting: Physical Therapy

## 2018-06-27 ENCOUNTER — Ambulatory Visit: Payer: Medicare Other | Admitting: Physical Therapy

## 2018-06-27 DIAGNOSIS — R2689 Other abnormalities of gait and mobility: Secondary | ICD-10-CM

## 2018-06-27 DIAGNOSIS — R293 Abnormal posture: Secondary | ICD-10-CM | POA: Diagnosis not present

## 2018-06-27 DIAGNOSIS — R29818 Other symptoms and signs involving the nervous system: Secondary | ICD-10-CM | POA: Diagnosis not present

## 2018-06-27 DIAGNOSIS — R2681 Unsteadiness on feet: Secondary | ICD-10-CM

## 2018-06-27 DIAGNOSIS — R471 Dysarthria and anarthria: Secondary | ICD-10-CM | POA: Diagnosis not present

## 2018-06-27 DIAGNOSIS — M6281 Muscle weakness (generalized): Secondary | ICD-10-CM | POA: Diagnosis not present

## 2018-06-27 NOTE — Patient Instructions (Signed)
(  Exercise) Monday Tuesday Wednesday Thursday Friday Saturday Sunday   Corner Balance exercises            Standing PWR! Moves           Walking              PD Spin  PWR! Circuit Drumming     PD Spin   Chair Yoga

## 2018-06-27 NOTE — Therapy (Signed)
[facility] [address] [place], [place], [address] Phone: [phone]   Fax:  [phone]  Physical Therapy Treatment  Patient Details  Name: Kristin [name] MRN: [id number] Date of Birth: April 06, 1951 Referring Provider (PT): Tat, [name]   Encounter Date: 06/27/2018  PT End of Session - 06/27/18 2315    Visit Number  4    Number of Visits  13    Date for PT Re-Evaluation  08/13/18    Authorization Type  Medicare and [employer]-Will need 10th visit PN    PT Start Time  1403    PT Stop Time  1444    PT Time Calculation (min)  41 min    Activity Tolerance  Patient tolerated treatment well    Behavior During Therapy  [facility] for tasks assessed/performed       Past Medical History:  Diagnosis Date  . Abnormal vaginal Pap smear   . Acute pharyngitis 02/25/2013  . Anemia   . Anxiety   . Arthritis   . 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 Chicken pox as a child  . Chronic UTI    sees dr Terance Hart  . Constipation 12/07/2015  . Depression with anxiety 11/02/2009   Qualifier: Diagnosis of  By: [name], LPN, [name]   . Dermatitis 07/17/2012  . Esophageal stricture 1994  . Fibroids   . Foot pain, bilateral 06/19/2012  . GERD (gastroesophageal reflux disease)   . GERD (gastroesophageal reflux disease)   . Hiatal hernia   . Hyperglycemia 08/19/2013  . 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 Hyperlipidemia   . Hypertension   . Infertility, female   . Low back pain 10/17/2007   Qualifier: Diagnosis of  By: [name], [name]   . Measles as a child  . Obesity   . Osteoarthritis   . Parkinson disease ([place])   . Plantar fasciitis of left foot 06/19/2012  . Preventative health care 12/19/2015  . Rheumatoid arthritis ([place]) 01/08/2018  . Rosacea 10/05/2014  . Swallowing difficulty   . Urinary frequency 02/25/2013  . Visual floaters 05/11/2014    Past Surgical History:  Procedure Laterality Date  .  ABDOMINAL HYSTERECTOMY  2006   total  . esophageal     stretching  . laporoscopy    . PARTIAL HIP ARTHROPLASTY  2010   right  . TONSILLECTOMY    . wisdom teeth extracted      There were no vitals filed for this visit.  Subjective Assessment - 06/27/18 1406    Subjective  Just get so tired with exercise.    Pertinent History  Parkinson's disease, anxiety, disc bulge of low back (had therapy at another clinic earlier this year)    Patient Stated Goals  Pt's goal for therapy is to feel stronger when I walk and more balance with walking.    Currently in Pain?  Yes    Pain Score  2     Pain Location  Foot    Pain Orientation  Right;Left    Pain Descriptors / Indicators  Aching    Pain Type  Acute pain    Pain Onset  More than a month ago    Pain Frequency  Intermittent    Aggravating Factors   being on feet all day yesterday    Pain Relieving Factors  moving eases pain  [facility] Adult PT Treatment/Exercise - 06/27/18 0001      Ambulation/Gait   Ambulation/Gait  Yes    Ambulation/Gait Assistance  7: Independent    Ambulation Distance (Feet)  1000 Feet   9 minutes outdoors   Assistive device  None    Gait Pattern  Step-through pattern;Decreased arm swing - right;Decreased step length - right    Ambulation Surface  Level;Unlevel;Indoor;Outdoor    Pre-Gait Activities  O2 sats after walking 98%, HR 103 bpm    Gait Comments  Discussed how to increase walking during her weekly exercise routine.  Provided exercise chart and encouraged patient to add walking in at gym area after the PD spin class (2x/wk) and possibly in neighborhood on the day she does yoga, to increase overall walking as part of HEP.        [facility]) - 06/27/18 2311    PWR! exercises  Moves in standing    PWR! Up  x 10    PWR! Rock  x 5 reps each side    PWR! Twist  x 10 reps each side    PWR Step  x 10 reps each side    Comments  PWR! Moves in standing with emphasis on  intensity/amplitude of movements       Balance Exercises - 06/27/18 1410      Balance Exercises: Standing   Standing Eyes Opened  Wide (BOA);Narrow base of support (BOS);Solid surface;Head turns;5 reps;Foam/compliant surface   Head nods   Standing Eyes Closed  Wide (BOA);Narrow base of support (BOS);Solid surface;Head turns;5 reps;Foam/compliant surface   Head nods   Stepping Strategy  Anterior;Posterior;Lateral;Foam/compliant surface;UE support;10 reps    Turning  Right;Left;5 reps   Quarter turn technique   Marching Limitations  on foam x 10 reps    Other Standing Exercises  Standing on foam with squat then into upright posture position x 10 reps        PT Education - 06/27/18 2314    Education Details  Adding walking into HEP-see exercise chart (try to add walking on days she does PD spin class at [facility] to walk in their gym; walk one day on weekends and walk on day she does yoga)    Person(s) Educated  Patient    Methods  Explanation    Comprehension  Verbalized understanding       PT Short Term Goals - 06/14/18 1334      PT SHORT TERM GOAL #1   Title  Pt will be independent with HEP for improved posture, balance, gait.  TARGET 07/12/18    Time  4    Period  Weeks    Status  New    Target Date  07/12/18      PT SHORT TERM GOAL #2   Title  Pt will improve 5x sit<>stand in less than or equal to 9.8 seconds (baseline from last d/c May 2018) for improved efficiency and safety with transfers.    Time  4    Period  Weeks    Status  New    Target Date  07/12/18      PT SHORT TERM GOAL #3   Title  Pt will improve MiniBEStest score to at least 22/28 for decreased fall risk.    Time  4    Period  Weeks    Status  New    Target Date  07/12/18      PT SHORT TERM GOAL #4   Title  Pt will verbalize understanding  of fall prevention in home environment.    Time  4    Period  Weeks    Status  New    Target Date  07/12/18        PT Long Term Goals - 06/14/18 1336      PT  LONG TERM GOAL #1   Title  Pt will verbalize plans for ongoing community fitness/optimal PD fitness program.  TARGET 07/26/18    Time  6    Period  Weeks    Status  New    Target Date  07/26/18      PT LONG TERM GOAL #2   Title  Pt will improve MiniBESTest score to at least 25/28 for decreased fall risk.    Time  6    Period  Weeks    Status  New    Target Date  07/26/18      PT LONG TERM GOAL #3   Title  Pt will improve gait velocity to at least 3.9 ft/sec for improved gait efficiency and safety.    Time  6    Period  Weeks    Status  New    Target Date  07/26/18      PT LONG TERM GOAL #4   Title  6MWT to be assessed, with goal to be written as appropriate.    Time  6    Period  Weeks    Status  New    Target Date  07/26/18            Plan - 06/27/18 2316    Clinical Impression Statement  Continued to work on compliant surface and dynamic balance activities as well as gait on outdoor surfaces.  Educated patient in how to gradually add in walking as part of her current HEP.  Pt will continue to benefit from skilled PT to address balance, gait and functional strength for improved overall mobility.    Rehab Potential  Good    PT Frequency  2x / week    PT Duration  6 weeks   plus eval   PT Treatment/Interventions  Balance training;Therapeutic exercise;Therapeutic activities;Functional mobility training;Gait training;Neuromuscular re-education;Patient/family education    PT Next Visit Plan  Per patient request:  work on floor to stand transfer; Compliant surface activities (progress corner balance exercises); review walking program for home, continue to incorporate head/eye movements with exercises.    Consulted and Agree with Plan of Care  Patient       Patient will benefit from skilled therapeutic intervention in order to improve the following deficits and impairments:  Abnormal gait, Decreased balance, Decreased mobility, Decreased strength, Difficulty walking, Postural  dysfunction  Visit Diagnosis: Other abnormalities of gait and mobility  Unsteadiness on feet     Problem List Patient Active Problem List   Diagnosis Date Noted  . Cough 05/10/2018  . Rheumatoid arthritis ([place]) 01/08/2018  . Hand pain, right 10/15/2017  . Vitamin D deficiency 12/20/2016  . Prediabetes 12/20/2016  . Shortness of breath on exertion 11/27/2016  . Preventative health care 12/19/2015  . Constipation 12/07/2015  . BCC (basal cell carcinoma of skin) 10/05/2014  . Rosacea 10/05/2014  . Parkinson's disease ([place]) 05/11/2014  . Visual floaters 05/11/2014  . Paralysis agitans ([place]) 01/08/2014  . Screening for cervical cancer 07/17/2012  . Foot pain, bilateral 06/19/2012  . 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 Obesity 11/12/2009  . Depression with anxiety 11/02/2009  . 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 Lumbar back pain with  radiculopathy affecting lower extremity 10/17/2007  . Essential hypertension 07/05/2007  . Osteoarthritis 07/05/2007  . Hyperlipidemia 06/26/2007  . H/O: iron deficiency anemia 05/08/2007  . GERD 05/08/2007    Emberlee Sortino W. 06/27/2018, 11:19 PM  Frazier Butt., PT   [facility] [address] [place], [place], [address] Phone: [phone]   Fax:  [phone]  Name: Kristin [name] MRN: [id number] Date of Birth: 08/05/1951

## 2018-06-28 ENCOUNTER — Ambulatory Visit: Payer: Medicare Other | Admitting: Physical Therapy

## 2018-06-28 ENCOUNTER — Encounter: Payer: Self-pay | Admitting: Physical Therapy

## 2018-06-28 DIAGNOSIS — R471 Dysarthria and anarthria: Secondary | ICD-10-CM | POA: Diagnosis not present

## 2018-06-28 DIAGNOSIS — R293 Abnormal posture: Secondary | ICD-10-CM | POA: Diagnosis not present

## 2018-06-28 DIAGNOSIS — R29818 Other symptoms and signs involving the nervous system: Secondary | ICD-10-CM | POA: Diagnosis not present

## 2018-06-28 DIAGNOSIS — M6281 Muscle weakness (generalized): Secondary | ICD-10-CM

## 2018-06-28 DIAGNOSIS — R2681 Unsteadiness on feet: Secondary | ICD-10-CM | POA: Diagnosis not present

## 2018-06-28 DIAGNOSIS — R2689 Other abnormalities of gait and mobility: Secondary | ICD-10-CM | POA: Diagnosis not present

## 2018-06-28 NOTE — Patient Instructions (Signed)
Verbally instructed patient to perform corner balance exercises (EO head turns/nods and EC head turns/nods), feet apart and feet together ON PILLOW SURFACE in corner

## 2018-06-28 NOTE — Therapy (Signed)
[facility] [address] [place], [place], [address] Phone: [phone]   Fax:  [phone]  Physical Therapy Treatment  Patient Details  Name: Kristin [name] MRN: [id number] Date of Birth: 03-Nov-1950 Referring Provider (PT): Tat, [name]   Encounter Date: 06/28/2018  PT End of Session - 06/28/18 1014    Visit Number  5    Number of Visits  13    Date for PT Re-Evaluation  08/13/18    Authorization Type  Medicare and [employer]-Will need 10th visit PN    PT Start Time  0848    PT Stop Time  0932    PT Time Calculation (min)  44 min    Activity Tolerance  Patient tolerated treatment well    Behavior During Therapy  [facility] for tasks assessed/performed       Past Medical History:  Diagnosis Date  . Abnormal vaginal Pap smear   . Acute pharyngitis 02/25/2013  . Anemia   . Anxiety   . Arthritis   . 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 Chicken pox as a child  . Chronic UTI    sees dr Terance Hart  . Constipation 12/07/2015  . Depression with anxiety 11/02/2009   Qualifier: Diagnosis of  By: [name], LPN, [name]   . Dermatitis 07/17/2012  . Esophageal stricture 1994  . Fibroids   . Foot pain, bilateral 06/19/2012  . GERD (gastroesophageal reflux disease)   . GERD (gastroesophageal reflux disease)   . Hiatal hernia   . Hyperglycemia 08/19/2013  . 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 Hyperlipidemia   . Hypertension   . Infertility, female   . Low back pain 10/17/2007   Qualifier: Diagnosis of  By: [name], [name]   . Measles as a child  . Obesity   . Osteoarthritis   . Parkinson disease ([place])   . Plantar fasciitis of left foot 06/19/2012  . Preventative health care 12/19/2015  . Rheumatoid arthritis ([place]) 01/08/2018  . Rosacea 10/05/2014  . Swallowing difficulty   . Urinary frequency 02/25/2013  . Visual floaters 05/11/2014    Past Surgical History:  Procedure Laterality Date  .  ABDOMINAL HYSTERECTOMY  2006   total  . esophageal     stretching  . laporoscopy    . PARTIAL HIP ARTHROPLASTY  2010   right  . TONSILLECTOMY    . wisdom teeth extracted      There were no vitals filed for this visit.  Subjective Assessment - 06/28/18 0852    Subjective  Will really try to do it-the exercises    Pertinent History  Parkinson's disease, anxiety, disc bulge of low back (had therapy at another clinic earlier this year)    Patient Stated Goals  Pt's goal for therapy is to feel stronger when I walk and more balance with walking.    Currently in Pain?  Yes    Pain Score  2     Pain Location  Foot    Pain Orientation  Right;Left    Pain Descriptors / Indicators  Aching    Pain Type  Acute pain    Pain Onset  More than a month ago    Pain Frequency  Intermittent    Aggravating Factors   being on feet too long    Pain Relieving Factors  moving eases pain  [facility] Adult PT Treatment/Exercise - 06/28/18 0001      Transfers   Transfers  Sit to Stand;Stand to Sit;Floor to Transfer    Sit to Stand  6: Modified independent (Device/Increase time);Without upper extremity assist;From chair/3-in-1;From bed    Stand to Sit  6: Modified independent (Device/Increase time);Without upper extremity assist;To chair/3-in-1    Floor to Transfer  6: Modified independent (Device/Increase time);With upper extremity assist   Floor to stand x 5 reps   Number of Reps  10 reps;Other sets (comment)   2 sets from 20" mat, 1 set from 16" chair   Comments  On solid surface and foam surface, with weighted ball lifts for improved UE use and posture.  With floor to stand practice, worked on quadruped PWR! step with rocking for improved hip flexibility, then practiced tall kneel to step with rocking to increase momentum to stand only using light UE support at mat.  (Practiced repetition of floor to stand 5 reps for functional strengthening for improved floor to stand  transfers.)      Knee/Hip Exercises: Standing   Lateral Step Up  Right;Left;1 set;10 reps;Hand Hold: 0;Step Height: 4"    Forward Step Up  Right;Left;2 sets;10 reps;Hand Hold: 0;Step Height: 4"   step up/up-down/down, then single step up         Balance Exercises - 06/28/18 0853      Balance Exercises: Standing   Standing Eyes Opened  Wide (BOA);Narrow base of support (BOS);Solid surface;Head turns;5 reps;Foam/compliant surface   Head nods   Standing Eyes Closed  Wide (BOA);Narrow base of support (BOS);Solid surface;Head turns;5 reps;Foam/compliant surface   Head turns   Stepping Strategy  Anterior;Posterior;Lateral;Foam/compliant surface;UE support;10 reps   in parallel bars on foam   Balance Beam  Marching in place x 20 reps, then alternating forward kicks x 20    Partial Tandem Stance  Eyes open;Foam/compliant surface;Intermittent upper extremity support;5 reps   Head turns/nods x 5; EC 10 second hold   Sidestepping  Foam/compliant support;5 reps;Upper extremity support   along balance beam   Other Standing Exercises  STanding on foam with forward>back stepping x 15 reps each leg        PT Education - 06/28/18 1014    Education Details  Verbally instructed pt to add pillows to current corner balance exercises    Person(s) Educated  Patient    Methods  Explanation;Demonstration;Verbal cues    Comprehension  Verbalized understanding;Returned demonstration       PT Short Term Goals - 06/14/18 1334      PT SHORT TERM GOAL #1   Title  Pt will be independent with HEP for improved posture, balance, gait.  TARGET 07/12/18    Time  4    Period  Weeks    Status  New    Target Date  07/12/18      PT SHORT TERM GOAL #2   Title  Pt will improve 5x sit<>stand in less than or equal to 9.8 seconds (baseline from last d/c May 2018) for improved efficiency and safety with transfers.    Time  4    Period  Weeks    Status  New    Target Date  07/12/18      PT SHORT TERM GOAL #3    Title  Pt will improve MiniBEStest score to at least 22/28 for decreased fall risk.    Time  4    Period  Weeks    Status  New  Target Date  07/12/18      PT SHORT TERM GOAL #4   Title  Pt will verbalize understanding of fall prevention in home environment.    Time  4    Period  Weeks    Status  New    Target Date  07/12/18        PT Long Term Goals - 06/14/18 1336      PT LONG TERM GOAL #1   Title  Pt will verbalize plans for ongoing community fitness/optimal PD fitness program.  TARGET 07/26/18    Time  6    Period  Weeks    Status  New    Target Date  07/26/18      PT LONG TERM GOAL #2   Title  Pt will improve MiniBESTest score to at least 25/28 for decreased fall risk.    Time  6    Period  Weeks    Status  New    Target Date  07/26/18      PT LONG TERM GOAL #3   Title  Pt will improve gait velocity to at least 3.9 ft/sec for improved gait efficiency and safety.    Time  6    Period  Weeks    Status  New    Target Date  07/26/18      PT LONG TERM GOAL #4   Title  6MWT to be assessed, with goal to be written as appropriate.    Time  6    Period  Weeks    Status  New    Target Date  07/26/18            Plan - 06/28/18 1014    Clinical Impression Statement  Skilled PT session focused on compliant balance surfaces and functional strengthening activities.  Pt pleased with being able to complete step up activities with no UE support at counter and 4" aerobic step.  With floor to stand transfers, pt able to use light UE support for transfers (PT recommends pt continue to use light UE support for improved steadiness and confidence with transfers).    Rehab Potential  Good    PT Frequency  2x / week    PT Duration  6 weeks   plus eval   PT Treatment/Interventions  Balance training;Therapeutic exercise;Therapeutic activities;Functional mobility training;Gait training;Neuromuscular re-education;Patient/family education    PT Next Visit Plan  Work on floor to  stand transfer; Compliant surface activities (review/continue to progress corner balance exercises); review walking program for home, hip flexibility/stretching    Consulted and Agree with Plan of Care  Patient       Patient will benefit from skilled therapeutic intervention in order to improve the following deficits and impairments:  Abnormal gait, Decreased balance, Decreased mobility, Decreased strength, Difficulty walking, Postural dysfunction  Visit Diagnosis: Unsteadiness on feet  Muscle weakness (generalized)     Problem List Patient Active Problem List   Diagnosis Date Noted  . Cough 05/10/2018  . Rheumatoid arthritis ([place]) 01/08/2018  . Hand pain, right 10/15/2017  . Vitamin D deficiency 12/20/2016  . Prediabetes 12/20/2016  . Shortness of breath on exertion 11/27/2016  . Preventative health care 12/19/2015  . Constipation 12/07/2015  . BCC (basal cell carcinoma of skin) 10/05/2014  . Rosacea 10/05/2014  . Parkinson's disease ([place]) 05/11/2014  . Visual floaters 05/11/2014  . Paralysis agitans ([place]) 01/08/2014  . Screening for cervical cancer 07/17/2012  . Foot pain, bilateral 06/19/2012  . 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  Obesity 11/12/2009  . Depression with anxiety 11/02/2009  . 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 Lumbar back pain with radiculopathy affecting lower extremity 10/17/2007  . Essential hypertension 07/05/2007  . Osteoarthritis 07/05/2007  . Hyperlipidemia 06/26/2007  . H/O: iron deficiency anemia 05/08/2007  . GERD 05/08/2007    Daimen Shovlin W. 06/28/2018, 10:18 AM  Frazier Butt., PT   [facility] [address] [place], [place], [address] Phone: [phone]   Fax:  [phone]  Name: CAILEEN VERACRUZ MRN: [id number] Date of Birth: 12-13-1950

## 2018-07-01 DIAGNOSIS — F331 Major depressive disorder, recurrent, moderate: Secondary | ICD-10-CM | POA: Diagnosis not present

## 2018-07-04 ENCOUNTER — Ambulatory Visit: Payer: Medicare Other | Admitting: Physical Therapy

## 2018-07-04 ENCOUNTER — Ambulatory Visit: Payer: Medicare Other

## 2018-07-04 ENCOUNTER — Telehealth: Payer: Self-pay | Admitting: Occupational Therapy

## 2018-07-04 ENCOUNTER — Ambulatory Visit: Payer: Medicare Other | Admitting: Occupational Therapy

## 2018-07-04 DIAGNOSIS — R29818 Other symptoms and signs involving the nervous system: Secondary | ICD-10-CM

## 2018-07-04 DIAGNOSIS — R1312 Dysphagia, oropharyngeal phase: Secondary | ICD-10-CM

## 2018-07-04 DIAGNOSIS — R471 Dysarthria and anarthria: Secondary | ICD-10-CM

## 2018-07-04 DIAGNOSIS — G20 Parkinson's disease: Secondary | ICD-10-CM

## 2018-07-04 DIAGNOSIS — R1319 Other dysphagia: Secondary | ICD-10-CM

## 2018-07-04 NOTE — Telephone Encounter (Signed)
Kristin Clarks, DO sent to Kristin [name], [place]        ok   Previous Messages    ----- Message -----  From: Kristin [name], CMA  Sent: 07/04/2018 11:13 AM EDT  To: Eustace Quail Tat, DO   Let me know if okay to order MBE.   ----- Message -----  From: Sharen Counter, CCC-SLP  Sent: 07/04/2018 11:10 AM EDT  To: [place], OT, Eustace Quail Tat, DO, *   Please see note from screen this AM.  Recommending objective swallow eval (modified barium swallow, or fiberendoscopic eval of swallowing), with return to this center if therapy is indicated during the swallow eval.      Called patient and she is okay with MBE.  We have scheduled her at [facility] on 07/11/18 at 11:00 am. They can be reached back at [phone]. Patient made aware. Order also sent for OT.

## 2018-07-04 NOTE — Therapy (Signed)
[facility] [address] [place], [place], [address] Phone: [phone]   Fax:  [phone]  Patient Details  Name: Kristin [name] MRN: [id number] Date of Birth: 1951/05/09 Referring Provider:  Mosie Lukes, MD  Encounter Date: 07/04/2018  Occupational Therapy Parkinson's Disease Screen   9-hole peg test:    RUE  30.29 sec        LUE  22.25 sec  Box & Blocks Test:   RUE  55 blocks        LUE  NT  Change in ability to perform ADLs/IADLs:  Writing has gotten worse.  Other Comments:  Weaker, had back pain, but gone now.  Diagnosed with RA due to pain in hands/feet.  4-5th digits on R hand seem to "stick together"   Pt would benefit from occupational therapy evaluation due to  Changes in coordination and writing, may need HEP update.   [facility] 07/04/2018, 10:21 AM  [facility] [address] [place], [place], [address] Phone: [phone]   Fax:  [place], OTR/L [facility] [address] [place], [place]  [address] [phone] phone [phone] 07/04/18 10:21 AM

## 2018-07-04 NOTE — Telephone Encounter (Signed)
ok 

## 2018-07-04 NOTE — Therapy (Signed)
[facility] [address] [place], [place], [address] Phone: [phone]   Fax:  [phone]  Patient Details  Name: Kristin [name] MRN: [id number] Date of Birth: 25-Mar-1951 Referring Provider:  Alonza Bogus, DO  Encounter Date: 07/04/2018   Speech Therapy Parkinson's Disease Screen   Decibel Level today: low 70s dB  (WNL=70-72 dB) with sound level meter 30cm away from pt's mouth. Pt's conversational volume has remained the same since last screening in 2017.  Pt reports having to be "extra careful" when she is eating out with friends due to fear of choking. She will eat a small amount of softer food but then takes the rest home. She knows she cannot order soup, for example, when eating out. She reports modifying her diet due to incr'd difficulty with drier or tougher meats.    **A MODIFIED BARIUM SWALLOW or FEES (fiber-endocopic evaluation of swallowing) IS RECOMMENDED at this time - please order via Epic or call [phone] to schedule. Pt's last objective swallow eval was 2017.**   If swallow therapy is recommended during swallow eval, pt should return to this center for swallow therapy. If swallow therapy not recommended, pt should return for ST screen in another 4-6 months.   [facility], [place]  07/04/2018, 10:45 AM  [facility] [address] [place] [address] [place], [place], [address] Phone: [phone]   Fax:  [phone]

## 2018-07-04 NOTE — Telephone Encounter (Signed)
Dr. [name] was seen for Parkinsons OT screen 07/04/18.   Pt would benefit from occupational therapy evaluation due to  Changes in coordination and writing and need for  HEP update.   If you are in agreement, please send updated order for Occupational therapy via epic.  Thank you,  Vianne Bulls, OTR/L [facility] [address] [place], [place]  [address] [phone] phone [phone] 07/04/18 12:36 PM

## 2018-07-05 ENCOUNTER — Ambulatory Visit: Payer: Medicare Other | Attending: Family Medicine | Admitting: Physical Therapy

## 2018-07-05 ENCOUNTER — Encounter: Payer: Self-pay | Admitting: Physical Therapy

## 2018-07-05 ENCOUNTER — Other Ambulatory Visit (HOSPITAL_COMMUNITY): Payer: Self-pay | Admitting: Neurology

## 2018-07-05 DIAGNOSIS — R41840 Attention and concentration deficit: Secondary | ICD-10-CM | POA: Diagnosis not present

## 2018-07-05 DIAGNOSIS — M79641 Pain in right hand: Secondary | ICD-10-CM | POA: Insufficient documentation

## 2018-07-05 DIAGNOSIS — R278 Other lack of coordination: Secondary | ICD-10-CM | POA: Insufficient documentation

## 2018-07-05 DIAGNOSIS — R293 Abnormal posture: Secondary | ICD-10-CM | POA: Insufficient documentation

## 2018-07-05 DIAGNOSIS — R29818 Other symptoms and signs involving the nervous system: Secondary | ICD-10-CM | POA: Insufficient documentation

## 2018-07-05 DIAGNOSIS — R251 Tremor, unspecified: Secondary | ICD-10-CM | POA: Insufficient documentation

## 2018-07-05 DIAGNOSIS — R2681 Unsteadiness on feet: Secondary | ICD-10-CM

## 2018-07-05 DIAGNOSIS — R2689 Other abnormalities of gait and mobility: Secondary | ICD-10-CM

## 2018-07-05 DIAGNOSIS — M6281 Muscle weakness (generalized): Secondary | ICD-10-CM | POA: Insufficient documentation

## 2018-07-05 DIAGNOSIS — R1310 Dysphagia, unspecified: Secondary | ICD-10-CM

## 2018-07-05 DIAGNOSIS — M79642 Pain in left hand: Secondary | ICD-10-CM | POA: Insufficient documentation

## 2018-07-05 DIAGNOSIS — R29898 Other symptoms and signs involving the musculoskeletal system: Secondary | ICD-10-CM | POA: Diagnosis not present

## 2018-07-05 NOTE — Therapy (Signed)
[facility] [address] [place], [place], [address] Phone: [phone]   Fax:  [phone]  Physical Therapy Treatment  Patient Details  Name: Kristin [name] MRN: [id number] Date of Birth: Jan 06, 1951 Referring Provider (PT): Tat, [name]   Encounter Date: 07/05/2018  PT End of Session - 07/05/18 1211    Visit Number  6    Number of Visits  13    Date for PT Re-Evaluation  08/13/18    Authorization Type  Medicare and [employer]-Will need 10th visit PN    PT Start Time  0849    PT Stop Time  0931    PT Time Calculation (min)  42 min    Activity Tolerance  Patient tolerated treatment well    Behavior During Therapy  [facility] for tasks assessed/performed       Past Medical History:  Diagnosis Date  . Abnormal vaginal Pap smear   . Acute pharyngitis 02/25/2013  . Anemia   . Anxiety   . Arthritis   . 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 Chicken pox as a child  . Chronic UTI    sees dr Terance Hart  . Constipation 12/07/2015  . Depression with anxiety 11/02/2009   Qualifier: Diagnosis of  By: [name], LPN, [name]   . Dermatitis 07/17/2012  . Esophageal stricture 1994  . Fibroids   . Foot pain, bilateral 06/19/2012  . GERD (gastroesophageal reflux disease)   . GERD (gastroesophageal reflux disease)   . Hiatal hernia   . Hyperglycemia 08/19/2013  . 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 Hyperlipidemia   . Hypertension   . Infertility, female   . Low back pain 10/17/2007   Qualifier: Diagnosis of  By: [name], [name]   . Measles as a child  . Obesity   . Osteoarthritis   . Parkinson disease ([place])   . Plantar fasciitis of left foot 06/19/2012  . Preventative health care 12/19/2015  . Rheumatoid arthritis ([place]) 01/08/2018  . Rosacea 10/05/2014  . Swallowing difficulty   . Urinary frequency 02/25/2013  . Visual floaters 05/11/2014    Past Surgical History:  Procedure Laterality Date  .  ABDOMINAL HYSTERECTOMY  2006   total  . esophageal     stretching  . laporoscopy    . PARTIAL HIP ARTHROPLASTY  2010   right  . TONSILLECTOMY    . wisdom teeth extracted      There were no vitals filed for this visit.  Subjective Assessment - 07/05/18 0852    Subjective  Saw the OT and speech therapist for screens yesterday-will plan to have swallow test and OT for several visits.    Pertinent History  Parkinson's disease, anxiety, disc bulge of low back (had therapy at another clinic earlier this year)    Patient Stated Goals  Pt's goal for therapy is to feel stronger when I walk and more balance with walking.    Currently in Pain?  No/denies    Pain Onset  More than a month ago                       [facility] Adult PT Treatment/Exercise - 07/05/18 0001      Transfers   Floor to Transfer  6: Modified independent (Device/Increase time);Other (comment)    Number of Reps  --   5 reps-with UE support, no UE support    Comments  Cues for positioning  and technique for floor to stand      Exercises   Exercises  Knee/Hip      Knee/Hip Exercises: Stretches   Other Knee/Hip Stretches  Attempted seated RLE hip external rotation stretch-pt has limitations due to previous hip surgery (reports no longer on any restrictions/precautions) and RLE more affected with PD-related stiffness.  Discussed trying supine hip external rotation stretch in a slightly more relaxed position.  Also discussed propping R foot on foot stool or chair for improved ease of movement for getting on socks and shoes.      With floor to stand transfer, cues for hand placement, cues for wide BOS foot placement and upright posture upon standing.  [facility]) - 07/05/18 6256    PWR! exercises  Moves in [place]! Up  x 10 reps    PWR! Rock  x 10 reps    PWR! Twist  x 10 reps    PWR! Step  x 5 reps each side    Comments  PWR! Moves in quadruped position, then in tall knee position     In tall kneel:   PWR! Up x 10, PWR! Rock x 10, [place]! Twist x 10 reps each side, then PWR! Step x 10 reps each side.  With each movement, provided rationale for how these moves help with transition movements to come off of floor.  With PWR! Step in quadruped and in tall kneel, focused on rocking back and focused on intentional rocking to stretch through hips once in half kneel position.  Balance Exercises - 07/05/18 0919      Balance Exercises: Standing   Standing Eyes Opened  Narrow base of support (BOS);Foam/compliant surface;5 reps;Head turns   Head nods   Standing Eyes Closed  Narrow base of support (BOS);Foam/compliant surface;5 reps;Head turns   Head nods   Partial Tandem Stance  Eyes open;Eyes closed;Foam/compliant surface;Upper extremity support 1;Upper extremity support 2;5 reps   Head nods, head turns   Other Standing Exercises  Standing on foam on incline/decline of ramp:  marching in place, alternating forward step taps, then head turns/head nods with feet apart, EO          PT Short Term Goals - 06/14/18 1334      PT SHORT TERM GOAL #1   Title  Pt will be independent with HEP for improved posture, balance, gait.  TARGET 07/12/18    Time  4    Period  Weeks    Status  New    Target Date  07/12/18      PT SHORT TERM GOAL #2   Title  Pt will improve 5x sit<>stand in less than or equal to 9.8 seconds (baseline from last d/c May 2018) for improved efficiency and safety with transfers.    Time  4    Period  Weeks    Status  New    Target Date  07/12/18      PT SHORT TERM GOAL #3   Title  Pt will improve MiniBEStest score to at least 22/28 for decreased fall risk.    Time  4    Period  Weeks    Status  New    Target Date  07/12/18      PT SHORT TERM GOAL #4   Title  Pt will verbalize understanding of fall prevention in home environment.    Time  4    Period  Weeks    Status  New    Target  Date  07/12/18        PT Long Term Goals - 06/14/18 1336      PT LONG TERM GOAL #1    Title  Pt will verbalize plans for ongoing community fitness/optimal PD fitness program.  TARGET 07/26/18    Time  6    Period  Weeks    Status  New    Target Date  07/26/18      PT LONG TERM GOAL #2   Title  Pt will improve MiniBESTest score to at least 25/28 for decreased fall risk.    Time  6    Period  Weeks    Status  New    Target Date  07/26/18      PT LONG TERM GOAL #3   Title  Pt will improve gait velocity to at least 3.9 ft/sec for improved gait efficiency and safety.    Time  6    Period  Weeks    Status  New    Target Date  07/26/18      PT LONG TERM GOAL #4   Title  6MWT to be assessed, with goal to be written as appropriate.    Time  6    Period  Weeks    Status  New    Target Date  07/26/18            Plan - 07/05/18 1211    Clinical Impression Statement  Skilled PT session focused on floor>stand transfers, with quadruped and tall kneeling quadruped exercises to address functional strengthening and flexibility needed for independent floor>stand.  Also worked on compliant balance exercises, with pt needing UE support with EC and head motions on compliant surfaces.  Ended session with seated gentle stretching and flexibility for RLE to improve flexibility motions including putting on shoes and socks.  Pt will continue to benefit from skilled PT to address balance, gait, posture and transfers.    Rehab Potential  Good    PT Frequency  2x / week    PT Duration  6 weeks   plus eval   PT Treatment/Interventions  Balance training;Therapeutic exercise;Therapeutic activities;Functional mobility training;Gait training;Neuromuscular re-education;Patient/family education    PT Next Visit Plan  Floor>stand transfer as needed, continue compliant surface activities, review walking program and hip flexibility; need to check STGs    Consulted and Agree with Plan of Care  Patient       Patient will benefit from skilled therapeutic intervention in order to improve the  following deficits and impairments:  Abnormal gait, Decreased balance, Decreased mobility, Decreased strength, Difficulty walking, Postural dysfunction  Visit Diagnosis: Unsteadiness on feet  Other symptoms and signs involving the nervous system  Other abnormalities of gait and mobility     Problem List Patient Active Problem List   Diagnosis Date Noted  . Cough 05/10/2018  . Rheumatoid arthritis ([place]) 01/08/2018  . Hand pain, right 10/15/2017  . Vitamin D deficiency 12/20/2016  . Prediabetes 12/20/2016  . Shortness of breath on exertion 11/27/2016  . Preventative health care 12/19/2015  . Constipation 12/07/2015  . BCC (basal cell carcinoma of skin) 10/05/2014  . Rosacea 10/05/2014  . Parkinson's disease ([place]) 05/11/2014  . Visual floaters 05/11/2014  . Paralysis agitans ([place]) 01/08/2014  . Screening for cervical cancer 07/17/2012  . Foot pain, bilateral 06/19/2012  . 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 Obesity 11/12/2009  . Depression with anxiety 11/02/2009  . 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 Lumbar back pain with radiculopathy affecting  lower extremity 10/17/2007  . Essential hypertension 07/05/2007  . Osteoarthritis 07/05/2007  . Hyperlipidemia 06/26/2007  . H/O: iron deficiency anemia 05/08/2007  . GERD 05/08/2007    Elizandro Laura W. 07/05/2018, 12:17 PM Frazier Butt., PT  [facility] [address] [place], [place], [address] Phone: [phone]   Fax:  [phone]  Name: Kristin [name] MRN: [id number] Date of Birth: 10/01/1950

## 2018-07-10 ENCOUNTER — Ambulatory Visit: Payer: Medicare Other | Admitting: Physical Therapy

## 2018-07-10 ENCOUNTER — Encounter: Payer: Self-pay | Admitting: Physical Therapy

## 2018-07-10 DIAGNOSIS — R2681 Unsteadiness on feet: Secondary | ICD-10-CM

## 2018-07-10 DIAGNOSIS — R293 Abnormal posture: Secondary | ICD-10-CM | POA: Diagnosis not present

## 2018-07-10 DIAGNOSIS — R29818 Other symptoms and signs involving the nervous system: Secondary | ICD-10-CM | POA: Diagnosis not present

## 2018-07-10 DIAGNOSIS — R2689 Other abnormalities of gait and mobility: Secondary | ICD-10-CM | POA: Diagnosis not present

## 2018-07-10 DIAGNOSIS — R29898 Other symptoms and signs involving the musculoskeletal system: Secondary | ICD-10-CM | POA: Diagnosis not present

## 2018-07-10 DIAGNOSIS — R278 Other lack of coordination: Secondary | ICD-10-CM | POA: Diagnosis not present

## 2018-07-10 NOTE — Therapy (Signed)
[facility] [address] [place], [place], [address] Phone: [phone]   Fax:  [phone]  Physical Therapy Treatment  Patient Details  Name: Kristin [name] MRN: [id number] Date of Birth: 1950-12-12 Referring Provider (PT): Tat, [name]   Encounter Date: 07/10/2018  PT End of Session - 07/10/18 1653    Visit Number  7    Number of Visits  13    Date for PT Re-Evaluation  08/13/18    Authorization Type  Medicare and [employer]-Will need 10th visit PN    PT Start Time  0804    PT Stop Time  0844    PT Time Calculation (min)  40 min    Activity Tolerance  Patient tolerated treatment well    Behavior During Therapy  [facility] for tasks assessed/performed       Past Medical History:  Diagnosis Date  . Abnormal vaginal Pap smear   . Acute pharyngitis 02/25/2013  . Anemia   . Anxiety   . Arthritis   . 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 Chicken pox as a child  . Chronic UTI    sees dr Terance Hart  . Constipation 12/07/2015  . Depression with anxiety 11/02/2009   Qualifier: Diagnosis of  By: [name], LPN, [name]   . Dermatitis 07/17/2012  . Esophageal stricture 1994  . Fibroids   . Foot pain, bilateral 06/19/2012  . GERD (gastroesophageal reflux disease)   . GERD (gastroesophageal reflux disease)   . Hiatal hernia   . Hyperglycemia 08/19/2013  . 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 Hyperlipidemia   . Hypertension   . Infertility, female   . Low back pain 10/17/2007   Qualifier: Diagnosis of  By: [name], [name]   . Measles as a child  . Obesity   . Osteoarthritis   . Parkinson disease ([place])   . Plantar fasciitis of left foot 06/19/2012  . Preventative health care 12/19/2015  . Rheumatoid arthritis ([place]) 01/08/2018  . Rosacea 10/05/2014  . Swallowing difficulty   . Urinary frequency 02/25/2013  . Visual floaters 05/11/2014    Past Surgical History:  Procedure Laterality Date  .  ABDOMINAL HYSTERECTOMY  2006   total  . esophageal     stretching  . laporoscopy    . PARTIAL HIP ARTHROPLASTY  2010   right  . TONSILLECTOMY    . wisdom teeth extracted      There were no vitals filed for this visit.  Subjective Assessment - 07/10/18 0806    Subjective  Nothing new.  Feel good.  Still tipping when I do exercises with eyes shut, it's slow but better.  Feel like I've gotten stronger.  Want to make sure to still work on getting up and down and head turns.    Pertinent History  Parkinson's disease, anxiety, disc bulge of low back (had therapy at another clinic earlier this year)    Patient Stated Goals  Pt's goal for therapy is to feel stronger when I walk and more balance with walking.    Currently in Pain?  No/denies    Pain Onset  More than a month ago                       [facility] Adult PT Treatment/Exercise - 07/10/18 0001      Transfers   Transfers  Sit to Stand;Stand to Sit;Floor to Transfer    Sit  to Stand  6: Modified independent (Device/Increase time);Without upper extremity assist;From chair/3-in-1;From bed    Five time sit to stand comments   8.9    Stand to Sit  6: Modified independent (Device/Increase time);Without upper extremity assist;To chair/3-in-1    Floor to Transfer  --      High Level Balance   High Level Balance Comments  MiniBESTest score:  25/28       Mini-BESTest: Balance Evaluation Systems Test  2005-2013 [employer]. All rights reserved. ________________________________________________________________________________________Anticipatory_________Subscore____5_/6 1. SIT TO STAND Instruction: "Cross your arms across your chest. Try not to use your hands unless you must.Do not let your legs lean against the back of the chair when you stand. Please stand up now." X(2) Normal: Comes to stand without use of hands and stabilizes independently. (1) Moderate: Comes to stand WITH use of hands on first  attempt. (0) Severe: Unable to stand up from chair without assistance, OR needs several attempts with use of hands. 2. RISE TO TOES Instruction: "Place your feet shoulder width apart. Place your hands on your hips. Try to rise as high as you can onto your toes. I will count out loud to 3 seconds. Try to hold this pose for at least 3 seconds. Look straight ahead. Rise now." X(2) Normal: Stable for 3 s with maximum height. (1) Moderate: Heels up, but not full range (smaller than when holding hands), OR noticeable instability for 3 s. (0) Severe: < 3 s. 3. STAND ON ONE LEG Instruction: "Look straight ahead. Keep your hands on your hips. Lift your leg off of the ground behind you without touching or resting your raised leg upon your other standing leg. Stay standing on one leg as long as you can. Look straight ahead. Lift now." Left: Time in Seconds Trial 1:__20___Trial 2:_____ X(2) Normal: 20 s. (1) Moderate: < 20 s. (0) Severe: Unable. Right: Time in Seconds Trial 1:___5.03__Trial 2:__16.63___ (2) Normal: 20 s. X(1) Moderate: < 20 s. (0) Severe: Unable To score each side separately use the trial with the longest time. To calculate the sub-score and total score use the side [left or right] with the lowest numerical score [i.e. the worse side]. ______________________________________________________________________________________Reactive Postural Control___________Subscore:___6__/6 4. COMPENSATORY STEPPING CORRECTION- FORWARD Instruction: "Stand with your feet shoulder width apart, arms at your sides. Lean forward against my hands beyond your forward limits. When I let go, do whatever is necessary, including taking a step, to avoid a fall." X(2) Normal: Recovers independently with a single, large step (second realignment step is allowed). (1) Moderate: More than one step used to recover equilibrium. (0) Severe: No step, OR would fall if not caught, OR falls spontaneously. 5. COMPENSATORY  STEPPING CORRECTION- BACKWARD Instruction: "Stand with your feet shoulder width apart, arms at your sides. Lean backward against my hands beyond your backward limits. When I let go, do whatever is necessary, including taking a step, to avoid a fall." X(2) Normal: Recovers independently with a single, large step. (1) Moderate: More than one step used to recover equilibrium. (0) Severe: No step, OR would fall if not caught, OR falls spontaneously. 6. COMPENSATORY STEPPING CORRECTION- LATERAL Instruction: "Stand with your feet together, arms down at your sides. Lean into my hand beyond your sideways limit. When I let go, do whatever is necessary, including taking a step, to avoid a fall." Left X(2) Normal: Recovers independently with 1 step (crossover or lateral OK). (1) Moderate: Several steps to recover equilibrium. (0) Severe: Falls, or cannot step.  Right X(2) Normal: Recovers independently with 1 step (crossover or lateral OK). (1) Moderate: Several steps to recover equilibrium. (0) Severe: Falls, or cannot step. Use the side with the lowest score to calculate sub-score and total score. ____________________________________________________________________________________Sensory Orientation_____________Subscore:_______6__/6 7. STANCE (FEET TOGETHER); EYES OPEN, FIRM SURFACE Instruction: "Place your hands on your hips. Place your feet together until almost touching. Look straight ahead. Be as stable and still as possible, until I say stop." Time in seconds:________ X(2) Normal: 30 s. (1) Moderate: < 30 s. (0) Severe: Unable. 8. STANCE (FEET TOGETHER); EYES CLOSED, FOAM SURFACE Instruction: "Step onto the foam. Place your hands on your hips. Place your feet together until almost touching. Be as stable and still as possible, until I say stop. I will start timing when you close your eyes." Time in seconds:________ X(2) Normal: 30 s. (1) Moderate: < 30 s. (0) Severe: Unable. 9. INCLINE-  EYES CLOSED Instruction: "Step onto the incline ramp. Please stand on the incline ramp with your toes toward the top. Place your feet shoulder width apart and have your arms down at your sides. I will start timing when you close your eyes." Time in seconds:________ X(2) Normal: Stands independently 30 s and aligns with gravity. (1) Moderate: Stands independently <30 s OR aligns with surface. (0) Severe: Unable. _________________________________________________________________________________________Dynamic Gait ______Subscore_____8___/10 10. CHANGE IN GAIT SPEED Instruction: "Begin walking at your normal speed, when I tell you 'fast', walk as fast as you can. When I say 'slow', walk very slowly." X(2) Normal: Significantly changes walking speed without imbalance. (1) Moderate: Unable to change walking speed or signs of imbalance. (0) Severe: Unable to achieve significant change in walking speed AND signs of imbalance. [place] - HORIZONTAL Instruction: "Begin walking at your normal speed, when I say "right", turn your head and look to the right. When I say "left" turn your head and look to the left. Try to keep yourself walking in a straight line." (2) Normal: performs head turns with no change in gait speed and good balance. X(1) Moderate: performs head turns with reduction in gait speed. (0) Severe: performs head turns with imbalance. 12. WALK WITH PIVOT TURNS Instruction: "Begin walking at your normal speed. When I tell you to 'turn and stop', turn as quickly as you can, face the opposite direction, and stop. After the turn, your feet should be close together." X(2) Normal: Turns with feet close FAST (< 3 steps) with good balance. (1) Moderate: Turns with feet close SLOW (>4 steps) with good balance. (0) Severe: Cannot turn with feet close at any speed without imbalance. 13. STEP OVER OBSTACLES Instruction: "Begin walking at your normal speed. When you get to the box,  step over it, not around it and keep walking." (2) Normal: Able to step over box with minimal change of gait speed and with good balance. X(1) Moderate: Steps over box but touches box OR displays cautious behavior by slowing gait. (0) Severe: Unable to step over box OR steps around box. 14. TIMED UP & GO WITH DUAL TASK [3 METER WALK] Instruction TUG: "When I say 'Go', stand up from chair, walk at your normal speed across the tape on the floor, turn around, and come back to sit in the chair." Instruction TUG with Dual Task: "Count backwards by threes starting at ___. When I say 'Go', stand up from chair, walk at your normal speed across the tape on the floor, turn around, and come back to sit in the chair. Continue  counting backwards the entire time." TUG: _____9.25___seconds; Dual Task TUG: ____9.22____seconds X(2) Normal: No noticeable change in sitting, standing or walking while backward counting when compared to TUG without Dual Task. (1) Moderate: Dual Task affects either counting OR walking (>10%) when compared to the TUG without Dual Task. (0) Severe: Stops counting while walking OR stops walking while counting. When scoring item 14, if subject's gait speed slows more than 10% between the TUG without and with a Dual Task the score should be decreased by a point. TOTAL SCORE: ____25____/28     Balance Exercises - 07/10/18 1649      Balance Exercises: Standing   Standing Eyes Closed  Wide (BOA);Narrow base of support (BOS);Foam/compliant surface;5 reps   Head turns, head nods   Gait with Head Turns  Forward;4 reps   Along hallway, also head nods 4 reps along hallway   Partial Tandem Stance  Eyes open;Foam/compliant surface;Intermittent upper extremity support;5 reps   Head turns/nods; then EC x 10 seconds     Verbally reviewed and pt demo understanding of HEP.  PT Education - 07/10/18 1653    Education Details  Results of STG check, updates to/progression of HEP    Person(s)  Educated  Patient    Methods  Explanation;Demonstration;Handout    Comprehension  Verbalized understanding;Returned demonstration       PT Short Term Goals - 07/10/18 1651      PT SHORT TERM GOAL #1   Title  Pt will be independent with HEP for improved posture, balance, gait.  TARGET 07/12/18    Time  4    Period  Weeks    Status  Achieved      PT SHORT TERM GOAL #2   Title  Pt will improve 5x sit<>stand in less than or equal to 9.8 seconds (baseline from last d/c May 2018) for improved efficiency and safety with transfers.    Time  4    Period  Weeks    Status  Achieved      PT SHORT TERM GOAL #3   Title  Pt will improve MiniBEStest score to at least 22/28 for decreased fall risk.    Time  4    Period  Weeks    Status  Achieved      PT SHORT TERM GOAL #4   Title  Pt will verbalize understanding of fall prevention in home environment.    Time  4    Period  Weeks    Status  On-going        PT Long Term Goals - 06/14/18 1336      PT LONG TERM GOAL #1   Title  Pt will verbalize plans for ongoing community fitness/optimal PD fitness program.  TARGET 07/26/18    Time  6    Period  Weeks    Status  New    Target Date  07/26/18      PT LONG TERM GOAL #2   Title  Pt will improve MiniBESTest score to at least 25/28 for decreased fall risk.    Time  6    Period  Weeks    Status  New    Target Date  07/26/18      PT LONG TERM GOAL #3   Title  Pt will improve gait velocity to at least 3.9 ft/sec for improved gait efficiency and safety.    Time  6    Period  Weeks    Status  New    Target  Date  07/26/18      PT LONG TERM GOAL #4   Title  6MWT to be assessed, with goal to be written as appropriate.    Time  6    Period  Weeks    Status  New    Target Date  07/26/18            Plan - 07/10/18 1654    Clinical Impression Statement  STGs checked this visit; STG 1-3 met, with pt independent with HEP, and pt improving 5x sit<>stand and MiniBEStest scores.  Per  patient, she is still unsteady with gait with head turns (addressed today through addition to HEP) and step up activity (not addressed today due to time constraints).  Pt will continue to benefit from skilled PT to address dynamic balance and gait activities for improved functional mobility.    Rehab Potential  Good    PT Frequency  2x / week    PT Duration  6 weeks   plus eval   PT Treatment/Interventions  Balance training;Therapeutic exercise;Therapeutic activities;Functional mobility training;Gait training;Neuromuscular re-education;Patient/family education    PT Next Visit Plan  Fall prevention education; review updates to HEP; work on step ups, compliant surface activities; review walking progress and address hip flexibility as needed    Consulted and Agree with Plan of Care  Patient       Patient will benefit from skilled therapeutic intervention in order to improve the following deficits and impairments:  Abnormal gait, Decreased balance, Decreased mobility, Decreased strength, Difficulty walking, Postural dysfunction  Visit Diagnosis: Unsteadiness on feet  Other abnormalities of gait and mobility     Problem List Patient Active Problem List   Diagnosis Date Noted  . Cough 05/10/2018  . Rheumatoid arthritis ([place]) 01/08/2018  . Hand pain, right 10/15/2017  . Vitamin D deficiency 12/20/2016  . Prediabetes 12/20/2016  . Shortness of breath on exertion 11/27/2016  . Preventative health care 12/19/2015  . Constipation 12/07/2015  . BCC (basal cell carcinoma of skin) 10/05/2014  . Rosacea 10/05/2014  . Parkinson's disease ([place]) 05/11/2014  . Visual floaters 05/11/2014  . Paralysis agitans ([place]) 01/08/2014  . Screening for cervical cancer 07/17/2012  . Foot pain, bilateral 06/19/2012  . 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 Obesity 11/12/2009  . Depression with anxiety 11/02/2009  . 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 Lumbar back pain with radiculopathy  affecting lower extremity 10/17/2007  . Essential hypertension 07/05/2007  . Osteoarthritis 07/05/2007  . Hyperlipidemia 06/26/2007  . H/O: iron deficiency anemia 05/08/2007  . GERD 05/08/2007    Beaulah Romanek W. 07/10/2018, 4:57 PM  Frazier Butt., PT   [facility] [address] [place], [place], [address] Phone: [phone]   Fax:  [phone]  Name: KAYSIE MICHELINI MRN: [id number] Date of Birth: [birth year]

## 2018-07-10 NOTE — Patient Instructions (Addendum)
Feet Partial Heel-Toe (Compliant Surface) Head Motion - Eyes Open    With eyes open, standing on compliant surface: __pillow or towel______, right foot partially in front of the other, move head slowly: up and down 5 times, then side to side 5 times.  Do _1-2___ sessions per day.  Copyright  [employer]. All rights reserved.  Feet Partial Heel-Toe (Compliant Surface) Varied Arm Positions - Eyes Closed    Stand on compliant surface: __pillow or towel______ with right foot partially in front of the other and arms out. Close eyes and visualize upright position. Hold__10_ seconds. Repeat _2___ times per session. Do __1-2__ sessions per day.  Copyright  [employer]. All rights reserved.  Side to Side Head Motion    Perform without assistive device. Walking on solid surface, turn head and eyes to left for _3___ steps. (Then, turn head and eyes straight ahead for __3__ steps.) Then, turn head and eyes to opposite side for __3__ steps. Repeat sequence __2-3__ times per session. Do __1-2__ sessions per day.  Copyright  [employer]. All rights reserved.  Up / Down Head Motion    Perform without assistive device. Walking on solid surface, move head and eyes toward ceiling for _3___ steps. (Then, move head and eyes straight ahead for __3__ steps.) Then, move head and eyes toward floor for __3__ steps. Repeat _2-3___ times per session. Do ___1-2_ sessions per day.   Copyright  [employer]. All rights reserved.

## 2018-07-11 ENCOUNTER — Ambulatory Visit (HOSPITAL_COMMUNITY)
Admission: RE | Admit: 2018-07-11 | Discharge: 2018-07-11 | Disposition: A | Discharge status: Home / Self Care | Payer: Medicare Other | Source: Ambulatory Visit | Attending: Neurology | Admitting: Neurology

## 2018-07-11 DIAGNOSIS — R1319 Other dysphagia: Secondary | ICD-10-CM

## 2018-07-11 DIAGNOSIS — G20 Parkinson's disease: Secondary | ICD-10-CM | POA: Diagnosis not present

## 2018-07-11 DIAGNOSIS — R1310 Dysphagia, unspecified: Secondary | ICD-10-CM | POA: Insufficient documentation

## 2018-07-15 DIAGNOSIS — F331 Major depressive disorder, recurrent, moderate: Secondary | ICD-10-CM | POA: Diagnosis not present

## 2018-07-16 ENCOUNTER — Ambulatory Visit: Payer: Medicare Other | Admitting: Physical Therapy

## 2018-07-16 ENCOUNTER — Encounter: Payer: Self-pay | Admitting: Physical Therapy

## 2018-07-16 DIAGNOSIS — R29898 Other symptoms and signs involving the musculoskeletal system: Secondary | ICD-10-CM | POA: Diagnosis not present

## 2018-07-16 DIAGNOSIS — R2689 Other abnormalities of gait and mobility: Secondary | ICD-10-CM

## 2018-07-16 DIAGNOSIS — R2681 Unsteadiness on feet: Secondary | ICD-10-CM

## 2018-07-16 DIAGNOSIS — R29818 Other symptoms and signs involving the nervous system: Secondary | ICD-10-CM | POA: Diagnosis not present

## 2018-07-16 DIAGNOSIS — M6281 Muscle weakness (generalized): Secondary | ICD-10-CM

## 2018-07-16 DIAGNOSIS — R293 Abnormal posture: Secondary | ICD-10-CM | POA: Diagnosis not present

## 2018-07-16 DIAGNOSIS — R278 Other lack of coordination: Secondary | ICD-10-CM | POA: Diagnosis not present

## 2018-07-16 NOTE — Patient Instructions (Addendum)

## 2018-07-16 NOTE — Therapy (Signed)
[facility] [address] [place], [place], [address] Phone: [phone]   Fax:  [phone]  Physical Therapy Treatment  Patient Details  Name: Kristin [name] MRN: [id number] Date of Birth: 1951-07-04 Referring Provider (PT): Tat, [name]   Encounter Date: 07/16/2018  PT End of Session - 07/16/18 2012    Visit Number  8    Number of Visits  13    Date for PT Re-Evaluation  08/13/18    Authorization Type  Medicare and [employer]-Will need 10th visit PN    PT Start Time  3300    PT Stop Time  1358    PT Time Calculation (min)  41 min    Activity Tolerance  Patient tolerated treatment well    Behavior During Therapy  [facility] for tasks assessed/performed       Past Medical History:  Diagnosis Date  . Abnormal vaginal Pap smear   . Acute pharyngitis 02/25/2013  . Anemia   . Anxiety   . Arthritis   . 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 Chicken pox as a child  . Chronic UTI    sees dr Terance Hart  . Constipation 12/07/2015  . Depression with anxiety 11/02/2009   Qualifier: Diagnosis of  By: [name], LPN, [name]   . Dermatitis 07/17/2012  . Esophageal stricture 1994  . Fibroids   . Foot pain, bilateral 06/19/2012  . GERD (gastroesophageal reflux disease)   . GERD (gastroesophageal reflux disease)   . Hiatal hernia   . Hyperglycemia 08/19/2013  . 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 Hyperlipidemia   . Hypertension   . Infertility, female   . Low back pain 10/17/2007   Qualifier: Diagnosis of  By: [name], [name]   . Measles as a child  . Obesity   . Osteoarthritis   . Parkinson disease ([place])   . Plantar fasciitis of left foot 06/19/2012  . Preventative health care 12/19/2015  . Rheumatoid arthritis ([place]) 01/08/2018  . Rosacea 10/05/2014  . Swallowing difficulty   . Urinary frequency 02/25/2013  . Visual floaters 05/11/2014    Past Surgical History:  Procedure Laterality Date  .  ABDOMINAL HYSTERECTOMY  2006   total  . esophageal     stretching  . laporoscopy    . PARTIAL HIP ARTHROPLASTY  2010   right  . TONSILLECTOMY    . wisdom teeth extracted      There were no vitals filed for this visit.  Subjective Assessment - 07/16/18 1318    Subjective  When I have so many things on the same day, I just get tired.  I got a thick piece of foam and have done exercises.    Pertinent History  Parkinson's disease, anxiety, disc bulge of low back (had therapy at another clinic earlier this year)    Patient Stated Goals  Pt's goal for therapy is to feel stronger when I walk and more balance with walking.    Currently in Pain?  No/denies    Pain Onset  More than a month ago                       [facility] Adult PT Treatment/Exercise - 07/16/18 0001      Ambulation/Gait   Ambulation/Gait  Yes    Ambulation/Gait Assistance  7: Independent    Ambulation Distance (Feet)  600 Feet    Assistive device  None  Gait Pattern  Step-through pattern;Decreased arm swing - right;Decreased step length - right   Slight slowing with head turns with gait   Gait Comments  Gait with head turns, initially to review HEP (pt return demo), then more random envrionmental scanning tasks with gait.  No overt LOB, but pt conitnues to slow pace with head turns/nods      Self-Care   Self-Care  Other Self-Care Comments    Other Self-Care Comments   Discussed fall prevention education with patient.      Exercises   Exercises  Knee/Hip      Knee/Hip Exercises: Standing   Forward Step Up  Right;Left;2 sets;10 reps;Hand Hold: 0;Step Height: 6";Hand Hold: 1   step up/up, down/down, then single step up   Step Down  Right;1 set;10 reps;Hand Hold: 2;Step Height: 6"          Balance Exercises - 07/16/18 2007      Balance Exercises: Standing   Tandem Gait  Forward;3 reps   Progressing to tandem march, 3 reps   Sidestepping  Foam/compliant support;Upper extremity support;3 reps    Cues for increased foot clearance   Marching Limitations  Marching forward, 3 reps along counter    Heel Raises Limitations  attempted x 10 reps, with pain in lateral left ankle.  Pt noted to "roll" ankles into supination, cues for weight through great toe for improved stability.        PT Education - 07/16/18 1326    Education Details  Fall prevention education provided; step ups exercise added to HEP for functional strengthening    Person(s) Educated  Patient    Methods  Explanation;Handout;Demonstration    Comprehension  Verbalized understanding;Verbal cues required;Returned demonstration       PT Short Term Goals - 07/10/18 1651      PT SHORT TERM GOAL #1   Title  Pt will be independent with HEP for improved posture, balance, gait.  TARGET 07/12/18    Time  4    Period  Weeks    Status  Achieved      PT SHORT TERM GOAL #2   Title  Pt will improve 5x sit<>stand in less than or equal to 9.8 seconds (baseline from last d/c May 2018) for improved efficiency and safety with transfers.    Time  4    Period  Weeks    Status  Achieved      PT SHORT TERM GOAL #3   Title  Pt will improve MiniBEStest score to at least 22/28 for decreased fall risk.    Time  4    Period  Weeks    Status  Achieved      PT SHORT TERM GOAL #4   Title  Pt will verbalize understanding of fall prevention in home environment.    Time  4    Period  Weeks    Status  On-going        PT Long Term Goals - 06/14/18 1336      PT LONG TERM GOAL #1   Title  Pt will verbalize plans for ongoing community fitness/optimal PD fitness program.  TARGET 07/26/18    Time  6    Period  Weeks    Status  New    Target Date  07/26/18      PT LONG TERM GOAL #2   Title  Pt will improve MiniBESTest score to at least 25/28 for decreased fall risk.    Time  6  Period  Weeks    Status  New    Target Date  07/26/18      PT LONG TERM GOAL #3   Title  Pt will improve gait velocity to at least 3.9 ft/sec for  improved gait efficiency and safety.    Time  6    Period  Weeks    Status  New    Target Date  07/26/18      PT LONG TERM GOAL #4   Title  6MWT to be assessed, with goal to be written as appropriate.    Time  6    Period  Weeks    Status  New    Target Date  07/26/18            Plan - 07/16/18 2012    Clinical Impression Statement  Completed STG check, with pt verbalizing understanding of fall prevention education provided.  Updated HEP to address functional strengthening with step-ups and step downs.  Continued to work on dynamic gait and balance exercises with head movements for environmental scanning and with compliant surfaces.  Pt will continue to benefit from skilled PT to address balance, gait for improved functional mobility.    Rehab Potential  Good    PT Frequency  2x / week    PT Duration  6 weeks   plus eval   PT Treatment/Interventions  Balance training;Therapeutic exercise;Therapeutic activities;Functional mobility training;Gait training;Neuromuscular re-education;Patient/family education    PT Next Visit Plan  Review step ups/step downs added to HEP, compliant surface activities; review walking progress and address hip flexibility as needed; floor to stand transfers    Consulted and Agree with Plan of Care  Patient       Patient will benefit from skilled therapeutic intervention in order to improve the following deficits and impairments:  Abnormal gait, Decreased balance, Decreased mobility, Decreased strength, Difficulty walking, Postural dysfunction  Visit Diagnosis: Unsteadiness on feet  Muscle weakness (generalized)  Other abnormalities of gait and mobility     Problem List Patient Active Problem List   Diagnosis Date Noted  . Cough 05/10/2018  . Rheumatoid arthritis ([place]) 01/08/2018  . Hand pain, right 10/15/2017  . Vitamin D deficiency 12/20/2016  . Prediabetes 12/20/2016  . Shortness of breath on exertion 11/27/2016  . Preventative health  care 12/19/2015  . Constipation 12/07/2015  . BCC (basal cell carcinoma of skin) 10/05/2014  . Rosacea 10/05/2014  . Parkinson's disease ([place]) 05/11/2014  . Visual floaters 05/11/2014  . Paralysis agitans ([place]) 01/08/2014  . Screening for cervical cancer 07/17/2012  . Foot pain, bilateral 06/19/2012  . 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 Obesity 11/12/2009  . Depression with anxiety 11/02/2009  . 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 Lumbar back pain with radiculopathy affecting lower extremity 10/17/2007  . Essential hypertension 07/05/2007  . Osteoarthritis 07/05/2007  . Hyperlipidemia 06/26/2007  . H/O: iron deficiency anemia 05/08/2007  . GERD 05/08/2007    Gennesis Hogland W. 07/16/2018, 8:15 PM  Frazier Butt., PT   [facility] [address] [place], [place], [address] Phone: [phone]   Fax:  [phone]  Name: KELIE GAINEY MRN: [id number] Date of Birth: 30-Apr-1951

## 2018-07-18 ENCOUNTER — Encounter: Payer: Self-pay | Admitting: Physical Therapy

## 2018-07-18 ENCOUNTER — Ambulatory Visit: Payer: Medicare Other | Admitting: Physical Therapy

## 2018-07-18 DIAGNOSIS — R293 Abnormal posture: Secondary | ICD-10-CM | POA: Diagnosis not present

## 2018-07-18 DIAGNOSIS — R29898 Other symptoms and signs involving the musculoskeletal system: Secondary | ICD-10-CM | POA: Diagnosis not present

## 2018-07-18 DIAGNOSIS — M6281 Muscle weakness (generalized): Secondary | ICD-10-CM

## 2018-07-18 DIAGNOSIS — R2689 Other abnormalities of gait and mobility: Secondary | ICD-10-CM

## 2018-07-18 DIAGNOSIS — R29818 Other symptoms and signs involving the nervous system: Secondary | ICD-10-CM | POA: Diagnosis not present

## 2018-07-18 DIAGNOSIS — R2681 Unsteadiness on feet: Secondary | ICD-10-CM | POA: Diagnosis not present

## 2018-07-18 DIAGNOSIS — R278 Other lack of coordination: Secondary | ICD-10-CM | POA: Diagnosis not present

## 2018-07-19 NOTE — Therapy (Signed)
[facility] [address] [place], [place], [address] Phone: [phone]   Fax:  [phone]  Physical Therapy Treatment  Patient Details  Name: Kristin [name] MRN: [id number] Date of Birth: 03/31/1951 Referring Provider (PT): Tat, [name]   Encounter Date: 07/18/2018  PT End of Session - 07/19/18 1326    Visit Number  9    Number of Visits  13    Date for PT Re-Evaluation  08/13/18    Authorization Type  Medicare and [employer]-Will need 10th visit PN    PT Start Time  1320   late arrival   PT Stop Time  1400    PT Time Calculation (min)  40 min    Activity Tolerance  Patient tolerated treatment well;Patient limited by pain   no incr left foot pain, but avoided exacerbating   Behavior During Therapy  [facility] for tasks assessed/performed       Past Medical History:  Diagnosis Date  . Abnormal vaginal Pap smear   . Acute pharyngitis 02/25/2013  . Anemia   . Anxiety   . Arthritis   . 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 Chicken pox as a child  . Chronic UTI    sees dr Terance Hart  . Constipation 12/07/2015  . Depression with anxiety 11/02/2009   Qualifier: Diagnosis of  By: [name], LPN, [name]   . Dermatitis 07/17/2012  . Esophageal stricture 1994  . Fibroids   . Foot pain, bilateral 06/19/2012  . GERD (gastroesophageal reflux disease)   . GERD (gastroesophageal reflux disease)   . Hiatal hernia   . Hyperglycemia 08/19/2013  . 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 Hyperlipidemia   . Hypertension   . Infertility, female   . Low back pain 10/17/2007   Qualifier: Diagnosis of  By: [name], [name]   . Measles as a child  . Obesity   . Osteoarthritis   . Parkinson disease ([place])   . Plantar fasciitis of left foot 06/19/2012  . Preventative health care 12/19/2015  . Rheumatoid arthritis ([place]) 01/08/2018  . Rosacea 10/05/2014  . Swallowing difficulty   . Urinary frequency 02/25/2013  .  Visual floaters 05/11/2014    Past Surgical History:  Procedure Laterality Date  . ABDOMINAL HYSTERECTOMY  2006   total  . esophageal     stretching  . laporoscopy    . PARTIAL HIP ARTHROPLASTY  2010   right  . TONSILLECTOMY    . wisdom teeth extracted      There were no vitals filed for this visit.  Subjective Assessment - 07/18/18 1321    Subjective  The outside of left foot has been hurting since doing the steps last time. Has been wearing her sneakers and has felt better. Is going to go to [facility] for full walking exam.     Pertinent History  Parkinson's disease, anxiety, disc bulge of low back (had therapy at another clinic earlier this year)    Patient Stated Goals  Pt's goal for therapy is to feel stronger when I walk and more balance with walking.    Currently in Pain?  Yes    Pain Score  2     Pain Location  Foot    Pain Orientation  Left    Pain Descriptors / Indicators  Throbbing    Pain Type  Acute pain    Pain Onset  In the past 7 days  Pain Frequency  Intermittent    Aggravating Factors   stairs    Pain Relieving Factors  wearing sneakers, cream from rheumatology                       [facility] Adult PT Treatment/Exercise - 07/18/18 1700      Ambulation/Gait   Ambulation/Gait Assistance  7: Independent;5: Supervision    Ambulation/Gait Assistance Details  vc for rolling over 1st toe on rt foot (she walks on lateral border); as she corrected rt foot placement reported her left foot hurt less while walking    Ambulation Distance (Feet)  400 Feet    Assistive device  None    Gait Pattern  Step-through pattern;Decreased arm swing - right;Decreased step length - right   rt foot supination   Pre-Gait Activities  slow gait working on rt foot placement       Knee/Hip Exercises: Standing   Forward Step Up  Right;Left;1 set;10 reps;Hand Hold: 1;Hand Hold: 0;Step Height: 6"   10 each leg; mild pain left foot when descends first,   Step Down   Right;Left;1 set;10 reps;Hand Hold: 0;Step Height: 6"   vc for rt foot placement over 1st toe to heel      Reviewed hip external rotation stretches she is doing to improve ability to reach her foot when donning socks/shoes; instructed in rt ankle eversion in sitting due to weakness(3+/5) in effort to improve positioning of her rt foot during heelstrike to toe-off. Discussed the positioning could be due to incr tone in supinators as her foot leaves the floor.     Balance Exercises - 07/18/18 1700      Balance Exercises: Standing   Standing Eyes Opened  Narrow base of support (BOS);Head turns;Foam/compliant surface   1" foam; partial tandem, lt foot fwd; mild incr lt foot pain   Standing Eyes Closed  Narrow base of support (BOS);Foam/compliant surface   1/2 tandem left foot forward   Gait with Head Turns  Forward   20 ft   Retro Gait  --   20 ft       PT Education - 07/18/18 1700    Education Details  Currently do corner balance ex's on solid floor (while outer left foot is still hurting); return to foam when foot improves; rt foot supinating and walks on lateral border of foot and how to correct; added eversion to HEP    Person(s) Educated  Patient    Methods  Explanation;Demonstration;Verbal cues    Comprehension  Verbalized understanding;Returned demonstration;Verbal cues required;Need further instruction       PT Short Term Goals - 07/10/18 1651      PT SHORT TERM GOAL #1   Title  Pt will be independent with HEP for improved posture, balance, gait.  TARGET 07/12/18    Time  4    Period  Weeks    Status  Achieved      PT SHORT TERM GOAL #2   Title  Pt will improve 5x sit<>stand in less than or equal to 9.8 seconds (baseline from last d/c May 2018) for improved efficiency and safety with transfers.    Time  4    Period  Weeks    Status  Achieved      PT SHORT TERM GOAL #3   Title  Pt will improve MiniBEStest score to at least 22/28 for decreased fall risk.    Time  4     Period  Weeks  Status  Achieved      PT SHORT TERM GOAL #4   Title  Pt will verbalize understanding of fall prevention in home environment.    Time  4    Period  Weeks    Status  On-going        PT Long Term Goals - 06/14/18 1336      PT LONG TERM GOAL #1   Title  Pt will verbalize plans for ongoing community fitness/optimal PD fitness program.  TARGET 07/26/18    Time  6    Period  Weeks    Status  New    Target Date  07/26/18      PT LONG TERM GOAL #2   Title  Pt will improve MiniBESTest score to at least 25/28 for decreased fall risk.    Time  6    Period  Weeks    Status  New    Target Date  07/26/18      PT LONG TERM GOAL #3   Title  Pt will improve gait velocity to at least 3.9 ft/sec for improved gait efficiency and safety.    Time  6    Period  Weeks    Status  New    Target Date  07/26/18      PT LONG TERM GOAL #4   Title  6MWT to be assessed, with goal to be written as appropriate.    Time  6    Period  Weeks    Status  New    Target Date  07/26/18            Plan - 07/19/18 1328    Clinical Impression Statement  Patient with LEFT lateral foot pain and session spent analyzing gait and how she is doing her HEP to determine potential cause and potential modifications. Minor modifications made to HEP. Noted pt lands on RIGHT lateral heel and then stays on lateral border of foot through toe off. As she corrected her right foot placement during exercise and gait, she reported decreased pain in left foot. Unable to work on compliant surfaces this date without increasing left foot pain. Will plan next visit to address floor transfers.     Rehab Potential  Good    PT Frequency  2x / week    PT Duration  6 weeks   plus eval   PT Treatment/Interventions  Balance training;Therapeutic exercise;Therapeutic activities;Functional mobility training;Gait training;Neuromuscular re-education;Patient/family education    PT Next Visit Plan  compliant surface activities  (if left foot pain improved); review walking progress (?did she try as a warmup to cycling class) floor to stand transfers    Consulted and Agree with Plan of Care  Patient       Patient will benefit from skilled therapeutic intervention in order to improve the following deficits and impairments:  Abnormal gait, Decreased balance, Decreased mobility, Decreased strength, Difficulty walking, Postural dysfunction  Visit Diagnosis: Other abnormalities of gait and mobility  Other symptoms and signs involving the nervous system  Muscle weakness (generalized)     Problem List Patient Active Problem List   Diagnosis Date Noted  . Cough 05/10/2018  . Rheumatoid arthritis ([place]) 01/08/2018  . Hand pain, right 10/15/2017  . Vitamin D deficiency 12/20/2016  . Prediabetes 12/20/2016  . Shortness of breath on exertion 11/27/2016  . Preventative health care 12/19/2015  . Constipation 12/07/2015  . BCC (basal cell carcinoma of skin) 10/05/2014  . Rosacea 10/05/2014  . Parkinson's disease ([place])  05/11/2014  . Visual floaters 05/11/2014  . Paralysis agitans ([place]) 01/08/2014  . Screening for cervical cancer 07/17/2012  . Foot pain, bilateral 06/19/2012  . 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 Obesity 11/12/2009  . Depression with anxiety 11/02/2009  . 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 Lumbar back pain with radiculopathy affecting lower extremity 10/17/2007  . Essential hypertension 07/05/2007  . Osteoarthritis 07/05/2007  . Hyperlipidemia 06/26/2007  . H/O: iron deficiency anemia 05/08/2007  . GERD 05/08/2007    Rexanne Mano, PT 07/19/2018, 1:34 PM  [facility] [address] [place], [place], [address] Phone: [phone]   Fax:  [phone]  Name: Kristin [name] MRN: [id number] Date of Birth: March 11, 1951

## 2018-07-24 ENCOUNTER — Encounter: Payer: Self-pay | Admitting: Physical Therapy

## 2018-07-24 ENCOUNTER — Ambulatory Visit: Payer: Medicare Other | Admitting: Physical Therapy

## 2018-07-24 DIAGNOSIS — R278 Other lack of coordination: Secondary | ICD-10-CM | POA: Diagnosis not present

## 2018-07-24 DIAGNOSIS — R293 Abnormal posture: Secondary | ICD-10-CM | POA: Diagnosis not present

## 2018-07-24 DIAGNOSIS — R2681 Unsteadiness on feet: Secondary | ICD-10-CM

## 2018-07-24 DIAGNOSIS — R2689 Other abnormalities of gait and mobility: Secondary | ICD-10-CM

## 2018-07-24 DIAGNOSIS — R29898 Other symptoms and signs involving the musculoskeletal system: Secondary | ICD-10-CM | POA: Diagnosis not present

## 2018-07-24 DIAGNOSIS — R29818 Other symptoms and signs involving the nervous system: Secondary | ICD-10-CM | POA: Diagnosis not present

## 2018-07-25 NOTE — Therapy (Signed)
[facility] [address] [place], [place], [address] Phone: [phone]   Fax:  [phone]  Physical Therapy Treatment  Patient Details  Name: Kristin [name] MRN: [id number] Date of Birth: 1951-08-24 Referring Provider (PT): Tat, [name]   Encounter Date: 07/24/2018  PT End of Session - 07/25/18 1535    Visit Number  10    Number of Visits  13    Date for PT Re-Evaluation  08/13/18    Authorization Type  Medicare and [employer]-Will need 10th visit PN    PT Start Time  0805    PT Stop Time  0840   d/c visit   PT Time Calculation (min)  35 min    Activity Tolerance  Patient tolerated treatment well;Patient limited by pain   no incr left foot pain, but avoided exacerbating   Behavior During Therapy  [facility] for tasks assessed/performed       Past Medical History:  Diagnosis Date  . Abnormal vaginal Pap smear   . Acute pharyngitis 02/25/2013  . Anemia   . Anxiety   . Arthritis   . 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 Chicken pox as a child  . Chronic UTI    sees dr Terance Hart  . Constipation 12/07/2015  . Depression with anxiety 11/02/2009   Qualifier: Diagnosis of  By: [name], LPN, [name]   . Dermatitis 07/17/2012  . Esophageal stricture 1994  . Fibroids   . Foot pain, bilateral 06/19/2012  . GERD (gastroesophageal reflux disease)   . GERD (gastroesophageal reflux disease)   . Hiatal hernia   . Hyperglycemia 08/19/2013  . 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 Hyperlipidemia   . Hypertension   . Infertility, female   . Low back pain 10/17/2007   Qualifier: Diagnosis of  By: [name], [name]   . Measles as a child  . Obesity   . Osteoarthritis   . Parkinson disease ([place])   . Plantar fasciitis of left foot 06/19/2012  . Preventative health care 12/19/2015  . Rheumatoid arthritis ([place]) 01/08/2018  . Rosacea 10/05/2014  . Swallowing difficulty   . Urinary frequency 02/25/2013  .  Visual floaters 05/11/2014    Past Surgical History:  Procedure Laterality Date  . ABDOMINAL HYSTERECTOMY  2006   total  . esophageal     stretching  . laporoscopy    . PARTIAL HIP ARTHROPLASTY  2010   right  . TONSILLECTOMY    . wisdom teeth extracted      There were no vitals filed for this visit.  Subjective Assessment - 07/24/18 0808    Subjective  Feel like the exercises, especially the classes are so helpful.  Did the [place] with friends over the weekend, but had some pain afterwards.    Pertinent History  Parkinson's disease, anxiety, disc bulge of low back (had therapy at another clinic earlier this year)    Patient Stated Goals  Pt's goal for therapy is to feel stronger when I walk and more balance with walking.    Currently in Pain?  Yes    Pain Score  2     Pain Location  Foot    Pain Orientation  Left    Pain Descriptors / Indicators  Aching    Pain Type  Acute pain    Pain Onset  In the past 7 days    Pain Frequency  Intermittent    Aggravating Factors  stairs     Pain Relieving Factors  wearing sneakers, cream from rhematology         [facility] PT Assessment - 07/24/18 0814      Ambulation/Gait   Ambulation/Gait  Yes    Ambulation/Gait Assistance  7: Independent    Ambulation Distance (Feet)  1400 Feet   1291 ft in 6 MWT   Assistive device  None    Gait Pattern  Step-through pattern;Decreased arm swing - right;Decreased step length - right    Ambulation Surface  Level;Indoor    Gait velocity  8.87 sec = 3.69 ft/sec      6 Minute Walk- Baseline   6 Minute Walk- Baseline  yes    HR (bpm)  83    02 Sat (%RA)  95 %      6 Minute walk- Post Test   6 Minute Walk Post Test  yes    HR (bpm)  109    02 Sat (%RA)  95 %    Modified Borg Scale for Dyspnea  3- Moderate shortness of breath or breathing difficulty    Perceived Rate of Exertion (Borg)  14-      6 minute walk test results    Aerobic Endurance Distance Walked  1291    Endurance additional  comments  219 ft first minute; 217 ft 6th minute                   [facility] Adult PT Treatment/Exercise - 07/24/18 0814      Transfers   Transfers  Sit to Stand;Stand to Sit    Sit to Stand  6: Modified independent (Device/Increase time);Without upper extremity assist;From chair/3-in-1;From bed    Five time sit to stand comments   9.59    Stand to Sit  6: Modified independent (Device/Increase time);Without upper extremity assist;To chair/3-in-1    Floor to Transfer  6: Modified independent (Device/Increase time);Other (comment)    Comments  Pt able to demonstrate getting onto and off of floor without external UE support, independently (extra time).      Self-Care   Self-Care  Other Self-Care Comments    Other Self-Care Comments   Reviewed fall prevention, and pt verbalizes understanding.  Discussed progress towards goals and plans for d/c this visit.  Discussed return screen versus return eval in 6 months following pt's d/c from OT.               PT Short Term Goals - 07/10/18 1651      PT SHORT TERM GOAL #1   Title  Pt will be independent with HEP for improved posture, balance, gait.  TARGET 07/12/18    Time  4    Period  Weeks    Status  Achieved      PT SHORT TERM GOAL #2   Title  Pt will improve 5x sit<>stand in less than or equal to 9.8 seconds (baseline from last d/c May 2018) for improved efficiency and safety with transfers.    Time  4    Period  Weeks    Status  Achieved      PT SHORT TERM GOAL #3   Title  Pt will improve MiniBEStest score to at least 22/28 for decreased fall risk.    Time  4    Period  Weeks    Status  Achieved      PT SHORT TERM GOAL #4   Title  Pt will verbalize understanding of fall prevention in home  environment.    Time  4    Period  Weeks    Status  On-going        PT Long Term Goals - 07/24/18 0835      PT LONG TERM GOAL #1   Title  Pt will verbalize plans for ongoing community fitness/optimal PD fitness program.   TARGET 07/26/18    Time  6    Period  Weeks    Status  Achieved      PT LONG TERM GOAL #2   Title  Pt will improve MiniBESTest score to at least 25/28 for decreased fall risk.    Time  6    Period  Weeks    Status  Achieved      PT LONG TERM GOAL #3   Title  Pt will improve gait velocity to at least 3.9 ft/sec for improved gait efficiency and safety.    Time  6    Period  Weeks    Status  Not Met      PT LONG TERM GOAL #4   Title  6MWT to be assessed, with goal to be written as appropriate.; >100 ft from baseline of 1156 ft    Time  6    Period  Weeks    Status  Achieved            Plan - 07/25/18 1537    Clinical Impression Statement  LTGs assessed this visit, with pt meeting LTG 1, 2, 4.  LTG not met but pt has progressed gait velocity to 3.69 ft/sec.  Pt has demonstrated improvements in gait and balance and is verbalizing/has demonstrated her independence and consistency with performance of HEP.  Pt is appropriate for d/c from PT at this time.    Rehab Potential  Good    PT Frequency  2x / week    PT Duration  6 weeks   plus eval   PT Treatment/Interventions  Balance training;Therapeutic exercise;Therapeutic activities;Functional mobility training;Gait training;Neuromuscular re-education;Patient/family education    PT Next Visit Plan  D/C this visit; upon d/c from OT, pt will schedule return screens versus eval    Consulted and Agree with Plan of Care  Patient       Patient will benefit from skilled therapeutic intervention in order to improve the following deficits and impairments:  Abnormal gait, Decreased balance, Decreased mobility, Decreased strength, Difficulty walking, Postural dysfunction  Visit Diagnosis: Other abnormalities of gait and mobility  Unsteadiness on feet     Problem List Patient Active Problem List   Diagnosis Date Noted  . Cough 05/10/2018  . Rheumatoid arthritis ([place]) 01/08/2018  . Hand pain, right 10/15/2017  . Vitamin D deficiency  12/20/2016  . Prediabetes 12/20/2016  . Shortness of breath on exertion 11/27/2016  . Preventative health care 12/19/2015  . Constipation 12/07/2015  . BCC (basal cell carcinoma of skin) 10/05/2014  . Rosacea 10/05/2014  . Parkinson's disease ([place]) 05/11/2014  . Visual floaters 05/11/2014  . Paralysis agitans ([place]) 01/08/2014  . Screening for cervical cancer 07/17/2012  . Foot pain, bilateral 06/19/2012  . 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 Obesity 11/12/2009  . Depression with anxiety 11/02/2009  . 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 Lumbar back pain with radiculopathy affecting lower extremity 10/17/2007  . Essential hypertension 07/05/2007  . Osteoarthritis 07/05/2007  . Hyperlipidemia 06/26/2007  . H/O: iron deficiency anemia 05/08/2007  . GERD 05/08/2007    Maci Eickholt W. 07/25/2018, 3:42 PM  Shyler Holzman Gerrit Friends, PT 07/25/18 3:42 PM  Phone: [phone] Fax: [facility] [address] [place], [place], [address] Phone: [phone]   Fax:  [phone]  Name: Kristin [name] MRN: [id number] Date of Birth: [birth year]    PHYSICAL THERAPY DISCHARGE SUMMARY  Visits from Start of Care: 10 (06/14/18-07/24/09)  Current functional level related to goals / functional outcomes: PT Long Term Goals - 07/24/18 0835      PT LONG TERM GOAL #1   Title  Pt will verbalize plans for ongoing community fitness/optimal PD fitness program.  TARGET 07/26/18    Time  6    Period  Weeks    Status  Achieved      PT LONG TERM GOAL #2   Title  Pt will improve MiniBESTest score to at least 25/28 for decreased fall risk.    Time  6    Period  Weeks    Status  Achieved      PT LONG TERM GOAL #3   Title  Pt will improve gait velocity to at least 3.9 ft/sec for improved gait efficiency and safety.    Time  6    Period  Weeks    Status  Not Met      PT LONG TERM GOAL #4   Title  6MWT to be assessed,  with goal to be written as appropriate.; >100 ft from baseline of 1156 ft    Time  6    Period  Weeks    Status  Achieved      Pt has met 3 of 4 LTGs.   Remaining deficits: Balance, gait; L foot pain (pt has history of this pain, and will follow up regarding this pain if it persists)   Education / Equipment: Educated in [employer], fall prevention, continued community fitness (pt plans to continue cycling, [employer] and [employer] classes), PARTs classes and plans to incorporate walking into her HEP.  Plan: Patient agrees to discharge.  Patient goals were partially met. Patient is being discharged due to being pleased with the current functional level.  ?????Recommend return PT screen versus eval in 6-9 months due to progressive nature of disease process.          Mady Haagensen, PT 07/25/18 3:46 PM Phone: [phone] Fax: [phone]

## 2018-07-29 ENCOUNTER — Encounter: Payer: Self-pay | Admitting: Occupational Therapy

## 2018-07-29 ENCOUNTER — Ambulatory Visit: Payer: Medicare Other | Admitting: Occupational Therapy

## 2018-07-29 DIAGNOSIS — R2681 Unsteadiness on feet: Secondary | ICD-10-CM | POA: Diagnosis not present

## 2018-07-29 DIAGNOSIS — R251 Tremor, unspecified: Secondary | ICD-10-CM

## 2018-07-29 DIAGNOSIS — R41840 Attention and concentration deficit: Secondary | ICD-10-CM

## 2018-07-29 DIAGNOSIS — R29898 Other symptoms and signs involving the musculoskeletal system: Secondary | ICD-10-CM

## 2018-07-29 DIAGNOSIS — R29818 Other symptoms and signs involving the nervous system: Secondary | ICD-10-CM | POA: Diagnosis not present

## 2018-07-29 DIAGNOSIS — R2689 Other abnormalities of gait and mobility: Secondary | ICD-10-CM | POA: Diagnosis not present

## 2018-07-29 DIAGNOSIS — R278 Other lack of coordination: Secondary | ICD-10-CM

## 2018-07-29 DIAGNOSIS — R293 Abnormal posture: Secondary | ICD-10-CM | POA: Diagnosis not present

## 2018-07-29 DIAGNOSIS — M79642 Pain in left hand: Secondary | ICD-10-CM

## 2018-07-29 DIAGNOSIS — M79641 Pain in right hand: Secondary | ICD-10-CM

## 2018-07-30 NOTE — Therapy (Signed)
[facility] [address] [place], [place], [address] Phone: [phone]   Fax:  [phone]  Occupational Therapy Evaluation  Patient Details  Name: Kristin [name] MRN: [id number] Date of Birth: 08-30-1951 Referring Provider (OT): Dr. [name] Tat   Encounter Date: 07/29/2018  OT End of Session - 07/29/18 1835    Visit Number  1    Number of Visits  17    Date for OT Re-Evaluation  09/27/18    Authorization Type  Medicare / [employer]. date 07/29/18-10/27/18    Authorization - Visit Number  1    Authorization - Number of Visits  10    OT Start Time  1319    OT Stop Time  1405    OT Time Calculation (min)  46 min    Activity Tolerance  Patient tolerated treatment well    Behavior During Therapy  WFL for tasks assessed/performed       Past Medical History:  Diagnosis Date  . Abnormal vaginal Pap smear   . Acute pharyngitis 02/25/2013  . Anemia   . Anxiety   . Arthritis   . 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 Chicken pox as a child  . Chronic UTI    sees dr Terance Hart  . Constipation 12/07/2015  . Depression with anxiety 11/02/2009   Qualifier: Diagnosis of  By: [name], LPN, [name]   . Dermatitis 07/17/2012  . Esophageal stricture 1994  . Fibroids   . Foot pain, bilateral 06/19/2012  . GERD (gastroesophageal reflux disease)   . GERD (gastroesophageal reflux disease)   . Hiatal hernia   . Hyperglycemia 08/19/2013  . 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 Hyperlipidemia   . Hypertension   . Infertility, female   . Low back pain 10/17/2007   Qualifier: Diagnosis of  By: [name], [name]   . Measles as a child  . Obesity   . Osteoarthritis   . Parkinson disease ([place])   . Plantar fasciitis of left foot 06/19/2012  . Preventative health care 12/19/2015  . Rheumatoid arthritis ([place]) 01/08/2018  . Rosacea 10/05/2014  . Swallowing difficulty   . Urinary  frequency 02/25/2013  . Visual floaters 05/11/2014    Past Surgical History:  Procedure Laterality Date  . ABDOMINAL HYSTERECTOMY  2006   total  . esophageal     stretching  . laporoscopy    . PARTIAL HIP ARTHROPLASTY  2010   right  . TONSILLECTOMY    . wisdom teeth extracted      There were no vitals filed for this visit.  Subjective Assessment - 07/29/18 1325    Subjective   My hands hurt all the time, but I haven't taken my meds.    Pertinent History  Parkinson's disease; RA diagnosis (spring 2019), depression, anxiety, HTN, OA, GERD, prediabetes, vitamin D deficiency, basal cell carcinoma of skin, hyperhydrosis, hyperlipidemia    Limitations  RA with hand and foot pain    Patient Stated Goals  improve writing, ability to open things, doing eye make-up, folding laundry, typing, unloading dishwasher, using screwdiver, donning shoes/socks    Currently in Pain?  Yes    Pain Score  3    2-6/10   Pain Location  Hand    Pain Orientation  Left;Right   R>L   Pain Descriptors / Indicators  Aching    Pain Type  Acute pain  Pain Onset  In the past 7 days    Pain Frequency  Constant    Aggravating Factors   use    Pain Relieving Factors  rest, better in the am        [facility] OT Assessment - 07/30/18 0001      Assessment   Medical Diagnosis  Parkinson's disease    Referring Provider (OT)  Dr. 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 Class 1x/wk, yoga once per week.      ADL    Eating/Feeding  --   difficulty with salad and soup, incr time   Eating/Feeding Patient Percentage  --   plastic utensils, paper plates   Grooming  --   difficulty with eye make-up   Upper Body Bathing  Modified independent    Lower Body Bathing  Modified independent    Upper Body Dressing  --   difficulty with backs of earrings   Lower Body Dressing  --   difficulty with shoes/socks (crossing RLE)   Toilet Transfer  Modified independent    Toileting - Clothing Manipulation  Modified independent    Toileting -  Hygiene  Modified Independent    Tub/Shower Transfer  Modified independent    ADL comments  functional tasks recording form 36 for 9 tasks:  folding laundry with 3 (somewhat difficult), typing 4 (mod difficult), writing 5 (very difficult), unloading dishwasher 3 (somewhat difficult), opening containers of food 4 (mod difficult), opening boxes 4 (mod difficult), eye make-up 3 (somewhat difficult), unscrewing small screw 5 (very difficult), shoes/socks 5 (very difficult)      IADL   Shopping  Takes care of all shopping needs independently    Prior Level of Function Light Housekeeping  incr time, difficulty with small screwdriver, anticipates difficulty with wrapping presents    Prior Level of Function Meal Prep  incr time difficulty opening containers    Community Mobility  Drives own vehicle   reports difficulty backing up     Mobility   Mobility Status  Independent      Written Expression   Dominant Hand  Left    Handwriting  90% legible   pt feels embarrassed regarding writing, rates very difficult     Cognition   Attention  Divided    Divided Attention  Impaired    Divided Attention Impairment  --   pt with difficulty during eating, make-up     Observation/Other Assessments   Other Surveys   Select    Physical Performance Test    Yes    Simulated Eating Time (seconds)  14.29    Donning Doffing Jacket Time (seconds)  23.28 (incr difficulty doffing)    Donning  Doffing Jacket Comments  Fastening/unfastening 3 buttons in 15.91sec      Posture/Postural Control   Posture/Postural  Control  Postural limitations    Postural Limitations  Forward head;Rounded Shoulders      Coordination   9 Hole Peg Test  Right;Left    Right 9 Hole Peg Test  22.79    Left 9 Hole Peg Test  19.41    Box and Blocks  R-54blocks, L-64blocks      Tone   Assessment Location  Right Upper Extremity   minimal     ROM / Strength   AROM / PROM / Strength  AROM      AROM   Overall AROM   Within functional limits for tasks performed    Overall AROM Comments  but reports that R 4-5th digits "stick" together and "get in the way" during functional tasks                      OT Education - 07/30/18 1452    Education Details  OT eval results/POC; how RA and pain may be impacting hand movement for functional tasks    Person(s) Educated  Patient    Methods  Explanation    Comprehension  Verbalized understanding       OT Short Term Goals - 07/30/18 1239      OT SHORT TERM GOAL #1   Title  Pt will be independent with updated HEP.--check STGs 08/27/18    Time  4    Period  Weeks    Status  New      OT SHORT TERM GOAL #2   Title  Pt will report unloading dishwasher as only "minimally difficult" (2)    Baseline  somewhat difficult (3)    Time  4    Period  Weeks    Status  New      OT SHORT TERM GOAL #3   Title  Pt will perform PPT#4 in less than 20 sec for incr ease with dressing.    Baseline  23.38sec    Time  4    Period  Weeks    Status  New      OT SHORT TERM GOAL #4   Title  Pt will report opening containers/boxes as only "somewhat difficult" (3)    Baseline  mod difficult (4)    Time  4    Period  Weeks    Status  New      OT SHORT TERM GOAL #5   Title  Pt will report folding laundry as "minimally difficult" (2)    Baseline  somewhat difficult (3)    Time  4    Period  Weeks    Status  New        OT Long Term Goals - 07/30/18 1242       OT LONG TERM GOAL #1   Title  Pt will verbalize understanding of adaptive stratgies to incr. ease/efficiency and decr pain with ADLs/IADLs--check LTGs 09/27/17    Time  8    Period  Weeks    Status  New      OT LONG TERM GOAL #2   Title  Pt will report typing on computer as "somewhat difficult" (3)    Baseline  mod difficult (4)    Time  8    Period  Weeks    Status  New      OT LONG TERM GOAL #3   Title  Pt will improve functional reaching/coordination for ADLs as shown by improving score on box and blocks test by at least  3blocks with RUE.    Baseline  R-54 blocks    Time  8    Period  Weeks    Status  New      OT LONG TERM GOAL #4   Title  Pt will report donning R shoe/sock as only "somewhat difficult" (3).    Baseline  rated as very difficult (5) on Functional tasks recording form    Period  Weeks    Status  New      OT LONG TERM GOAL #5   Title  Pt will be able to write 4-5 sentences with 100% legibility and rate as "somewhat difficult".    Baseline  very difficult, 90%    Time  8    Period  Weeks    Status  New      OT LONG TERM GOAL #6   Title  Pt will report unscrewing small screw as "somewhat difficult (3)."    Baseline  very difficult (5)    Time  8    Period  Weeks    Status  New            Plan - 07/30/18 1226    Clinical Impression Statement  Pt is a 67 y.o. female with Parkinson's disease.  Pt also diagnosed with RA earlier this year and reports pain in hands.  Pt with PMH that includes:  RA, depression, anxiety, HTN, OA, GERD, prediabetes, vitamin D deficiency, basal cell carcinoma of skin, hyperhydrosis, hyperlipidemia, R partial hip arthroplasty.  Pt presents today with bradykinesia, decr coordination, decr posture, decr balance/functional mobility, rigidy, decr divided attention, hand pain.  Pt would benefit from occupational therapy to improve UE functional use, decr risk for future complications, incr ease/efficiency with ADLs/IADLs, and  improve quality of life.    Occupational Profile and client history currently impacting functional performance  Pt is independent with ADLs/IADLs, but now reports pain in hands affecting ADLs and incr difficulty for ADLs/IADLs and ability to participate in social/leisure activities as she previously has.    Occupational performance deficits (Please refer to evaluation for details):  ADL's;IADL's;Leisure;Social Participation    Rehab Potential  Good    OT Frequency  2x / week    OT Duration  8 weeks   +eval (or 16 visits)   OT Treatment/Interventions  Self-care/ADL training;Therapeutic exercise;Moist Heat;Paraffin;Neuromuscular education;Splinting;Patient/family education;Therapeutic activities;Functional Mobility Training;Energy conservation;Fluidtherapy;Cryotherapy;Ultrasound;Contrast Bath;DME and/or AE instruction;Manual Therapy;Passive range of motion;Cognitive remediation/compensation;Balance training    Plan  review/update coordination HEP, strategies for ADLs/IADLs    Clinical Decision Making  Several treatment options, min-mod task modification necessary    Recommended Other Services  recent d/c from PT    Consulted and Agree with Plan of Care  Patient       Patient will benefit from skilled therapeutic intervention in order to improve the following deficits and impairments:  Decreased cognition, Decreased knowledge of use of DME, Pain, Decreased mobility, Decreased coordination, Decreased activity tolerance, Decreased range of motion, Impaired tone, Improper spinal/pelvic alignment, Impaired UE functional use, Decreased knowledge of precautions, Decreased balance(bradykinesia)  Visit Diagnosis: Other symptoms and signs involving the nervous system  Other symptoms and signs involving the musculoskeletal system  Other lack of coordination  Abnormal posture  Attention and concentration deficit  Tremor  Other abnormalities of gait and mobility  Unsteadiness on feet  Pain in left  hand  Pain in right hand    Problem List Patient Active Problem List   Diagnosis Date Noted  . Cough 05/10/2018  .  Rheumatoid arthritis ([place]) 01/08/2018  . Hand pain, right 10/15/2017  . Vitamin D deficiency 12/20/2016  . Prediabetes 12/20/2016  . Shortness of breath on exertion 11/27/2016  . Preventative health care 12/19/2015  . Constipation 12/07/2015  . BCC (basal cell carcinoma of skin) 10/05/2014  . Rosacea 10/05/2014  . Parkinson's disease ([place]) 05/11/2014  . Visual floaters 05/11/2014  . Paralysis agitans ([place]) 01/08/2014  . Screening for cervical cancer 07/17/2012  . Foot pain, bilateral 06/19/2012  . 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 Obesity 11/12/2009  . Depression with anxiety 11/02/2009  . 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 Lumbar back pain with radiculopathy affecting lower extremity 10/17/2007  . Essential hypertension 07/05/2007  . Osteoarthritis 07/05/2007  . Hyperlipidemia 06/26/2007  . H/O: iron deficiency anemia 05/08/2007  . GERD 05/08/2007    [facility] 07/30/2018, 2:53 PM  [facility] [address] [place], [place], [address] Phone: [phone]   Fax:  [phone]  Name: Kristin [name] MRN: [id number] Date of Birth: Dec 10, 1950   Vianne Bulls, OTR/L [facility] [address] [place], [place]  [address] [phone] phone [phone] 07/30/18 2:53 PM

## 2018-08-06 ENCOUNTER — Ambulatory Visit: Payer: Medicare Other | Attending: Family Medicine | Admitting: Occupational Therapy

## 2018-08-06 ENCOUNTER — Encounter: Payer: Self-pay | Admitting: Occupational Therapy

## 2018-08-06 DIAGNOSIS — R278 Other lack of coordination: Secondary | ICD-10-CM | POA: Diagnosis not present

## 2018-08-06 DIAGNOSIS — M79642 Pain in left hand: Secondary | ICD-10-CM | POA: Insufficient documentation

## 2018-08-06 DIAGNOSIS — M6281 Muscle weakness (generalized): Secondary | ICD-10-CM | POA: Insufficient documentation

## 2018-08-06 DIAGNOSIS — M79641 Pain in right hand: Secondary | ICD-10-CM | POA: Diagnosis not present

## 2018-08-06 DIAGNOSIS — R41840 Attention and concentration deficit: Secondary | ICD-10-CM

## 2018-08-06 DIAGNOSIS — R2689 Other abnormalities of gait and mobility: Secondary | ICD-10-CM | POA: Diagnosis not present

## 2018-08-06 DIAGNOSIS — R29898 Other symptoms and signs involving the musculoskeletal system: Secondary | ICD-10-CM | POA: Diagnosis not present

## 2018-08-06 DIAGNOSIS — R293 Abnormal posture: Secondary | ICD-10-CM | POA: Diagnosis not present

## 2018-08-06 DIAGNOSIS — R2681 Unsteadiness on feet: Secondary | ICD-10-CM | POA: Insufficient documentation

## 2018-08-06 DIAGNOSIS — R29818 Other symptoms and signs involving the nervous system: Secondary | ICD-10-CM | POA: Diagnosis not present

## 2018-08-06 DIAGNOSIS — R251 Tremor, unspecified: Secondary | ICD-10-CM | POA: Insufficient documentation

## 2018-08-06 NOTE — Therapy (Signed)
[facility] [address] [place], [place], [address] Phone: [phone]   Fax:  [phone]  Occupational Therapy Treatment  Patient Details  Name: Kristin [name] MRN: [id number] Date of Birth: [birth year] Referring Provider (OT): Dr. [name] Tat   Encounter Date: 08/06/2018  OT End of Session - 08/06/18 2118    Visit Number  2    Number of Visits  17    Date for OT Re-Evaluation  09/27/18    Authorization Type  Medicare / [employer]. date 07/29/18-10/27/18    Authorization - Visit Number  2    Authorization - Number of Visits  10    OT Start Time  1020    OT Stop Time  1100    OT Time Calculation (min)  40 min    Activity Tolerance  Patient tolerated treatment well    Behavior During Therapy  WFL for tasks assessed/performed       Past Medical History:  Diagnosis Date  . Abnormal vaginal Pap smear   . Acute pharyngitis 02/25/2013  . Anemia   . Anxiety   . Arthritis   . 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 Chicken pox as a child  . Chronic UTI    sees dr Terance Hart  . Constipation 12/07/2015  . Depression with anxiety 11/02/2009   Qualifier: Diagnosis of  By: [name], LPN, [name]   . Dermatitis 07/17/2012  . Esophageal stricture 1994  . Fibroids   . Foot pain, bilateral 06/19/2012  . GERD (gastroesophageal reflux disease)   . GERD (gastroesophageal reflux disease)   . Hiatal hernia   . Hyperglycemia 08/19/2013  . 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 Hyperlipidemia   . Hypertension   . Infertility, female   . Low back pain 10/17/2007   Qualifier: Diagnosis of  By: [name], [name]   . Measles as a child  . Obesity   . Osteoarthritis   . Parkinson disease ([place])   . Plantar fasciitis of left foot 06/19/2012  . Preventative health care 12/19/2015  . Rheumatoid arthritis ([place]) 01/08/2018  . Rosacea 10/05/2014  . Swallowing difficulty   . Urinary  frequency 02/25/2013  . Visual floaters 05/11/2014    Past Surgical History:  Procedure Laterality Date  . ABDOMINAL HYSTERECTOMY  2006   total  . esophageal     stretching  . laporoscopy    . PARTIAL HIP ARTHROPLASTY  2010   right  . TONSILLECTOMY    . wisdom teeth extracted      There were no vitals filed for this visit.  Subjective Assessment - 08/06/18 1025    Subjective   Pt wrapped packages and it went well, but my R fingers wanted to stick together, but it was fine I just stretched.     Pertinent History  Parkinson's disease; RA diagnosis (spring 2019), depression, anxiety, HTN, OA, GERD, prediabetes, vitamin D deficiency, basal cell carcinoma of skin, hyperhydrosis, hyperlipidemia    Limitations  RA with hand and foot pain    Patient Stated Goals  improve writing, ability to open things, doing eye make-up, folding laundry, typing, unloading dishwasher, using screwdiver, donning shoes/socks    Currently in Pain?  No/denies                           OT Education - 08/06/18 2116  Education Details  Reviewed and updated coordination HEP with focus on large amplitude, deliberate movements, emphasizing start/stop of movement, wrist movements and finger abduction/individual finger coordination--see pt instructions    Person(s) Educated  Patient    Methods  Explanation;Demonstration;Verbal cues;Handout    Comprehension  Verbalized understanding;Returned demonstration;Verbal cues required       OT Short Term Goals - 07/30/18 1239      OT SHORT TERM GOAL #1   Title  Pt will be independent with updated HEP.--check STGs 08/27/18    Time  4    Period  Weeks    Status  New      OT SHORT TERM GOAL #2   Title  Pt will report unloading dishwasher as only "minimally difficult" (2)    Baseline  somewhat difficult (3)    Time  4    Period  Weeks    Status  New      OT SHORT TERM GOAL #3   Title  Pt will perform PPT#4 in less than 20 sec for incr ease with  dressing.    Baseline  23.38sec    Time  4    Period  Weeks    Status  New      OT SHORT TERM GOAL #4   Title  Pt will report opening containers/boxes as only "somewhat difficult" (3)    Baseline  mod difficult (4)    Time  4    Period  Weeks    Status  New      OT SHORT TERM GOAL #5   Title  Pt will report folding laundry as "minimally difficult" (2)    Baseline  somewhat difficult (3)    Time  4    Period  Weeks    Status  New        OT Long Term Goals - 07/30/18 1242      OT LONG TERM GOAL #1   Title  Pt will verbalize understanding of adaptive stratgies to incr. ease/efficiency and decr pain with ADLs/IADLs--check LTGs 09/27/17    Time  8    Period  Weeks    Status  New      OT LONG TERM GOAL #2   Title  Pt will report typing on computer as "somewhat difficult" (3)    Baseline  mod difficult (4)    Time  8    Period  Weeks    Status  New      OT LONG TERM GOAL #3   Title  Pt will improve functional reaching/coordination for ADLs as shown by improving score on box and blocks test by at least 3blocks with RUE.    Baseline  R-54 blocks    Time  8    Period  Weeks    Status  New      OT LONG TERM GOAL #4   Title  Pt will report donning R shoe/sock as only "somewhat difficult" (3).    Baseline  rated as very difficult (5) on Functional tasks recording form    Period  Weeks    Status  New      OT LONG TERM GOAL #5   Title  Pt will be able to write 4-5 sentences with 100% legibility and rate as "somewhat difficult".    Baseline  very difficult, 90%    Time  8    Period  Weeks    Status  New      OT LONG TERM GOAL #  6   Title  Pt will report unscrewing small screw as "somewhat difficult (3)."    Baseline  very difficult (5)    Time  8    Period  Weeks    Status  New            Plan - 08/06/18 1024    Clinical Impression Statement  Pt responded well to cueing for large amplitude and technique to address coordination, bradykinesia, and rigidity for  improved functional use of hands.  Pt denies significant hand pain today and reports that she took first shot of RA medication.  Pt is progressing towards goals.    Occupational Profile and client history currently impacting functional performance  Pt is independent with ADLs/IADLs, but now reports pain in hands affecting ADLs and incr difficulty for ADLs/IADLs and ability to participate in social/leisure activities as she previously has.    Occupational performance deficits (Please refer to evaluation for details):  ADL's;IADL's;Leisure;Social Participation    Rehab Potential  Good    OT Frequency  2x / week    OT Duration  8 weeks   +eval (or 16 visits)   OT Treatment/Interventions  Self-care/ADL training;Therapeutic exercise;Moist Heat;Paraffin;Neuromuscular education;Splinting;Patient/family education;Therapeutic activities;Functional Mobility Training;Energy conservation;Fluidtherapy;Cryotherapy;Ultrasound;Contrast Bath;DME and/or AE instruction;Manual Therapy;Passive range of motion;Cognitive remediation/compensation;Balance training    Plan  strategies for ADLs/IADLs; tie shoe/socks; writing    Clinical Decision Making  Several treatment options, min-mod task modification necessary    Recommended Other Services  recent d/c from PT    Consulted and Agree with Plan of Care  Patient       Patient will benefit from skilled therapeutic intervention in order to improve the following deficits and impairments:  Decreased cognition, Decreased knowledge of use of DME, Pain, Decreased mobility, Decreased coordination, Decreased activity tolerance, Decreased range of motion, Impaired tone, Improper spinal/pelvic alignment, Impaired UE functional use, Decreased knowledge of precautions, Decreased balance(bradykinesia)  Visit Diagnosis: Other symptoms and signs involving the nervous system  Other symptoms and signs involving the musculoskeletal system  Other lack of coordination  Abnormal  posture  Attention and concentration deficit  Tremor  Other abnormalities of gait and mobility  Unsteadiness on feet    Problem List Patient Active Problem List   Diagnosis Date Noted  . Cough 05/10/2018  . Rheumatoid arthritis ([place]) 01/08/2018  . Hand pain, right 10/15/2017  . Vitamin D deficiency 12/20/2016  . Prediabetes 12/20/2016  . Shortness of breath on exertion 11/27/2016  . Preventative health care 12/19/2015  . Constipation 12/07/2015  . BCC (basal cell carcinoma of skin) 10/05/2014  . Rosacea 10/05/2014  . Parkinson's disease ([place]) 05/11/2014  . Visual floaters 05/11/2014  . Paralysis agitans ([place]) 01/08/2014  . Screening for cervical cancer 07/17/2012  . Foot pain, bilateral 06/19/2012  . 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 Obesity 11/12/2009  . Depression with anxiety 11/02/2009  . 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 Lumbar back pain with radiculopathy affecting lower extremity 10/17/2007  . Essential hypertension 07/05/2007  . Osteoarthritis 07/05/2007  . Hyperlipidemia 06/26/2007  . H/O: iron deficiency anemia 05/08/2007  . GERD 05/08/2007    [facility] 08/06/2018, 9:21 PM  [facility] [address] [place], [place], [address] Phone: [phone]   Fax:  [phone]  Name: Kristin [name] MRN: [id number] Date of Birth: August 22, 1951   Vianne Bulls, OTR/L [facility] [address] [place], [place]  [address] [phone] phone [phone] 08/06/18 9:21 PM

## 2018-08-06 NOTE — Patient Instructions (Signed)
Coordination Exercises  Perform with both hand(s). Perform using big movements.   Flipping Cards: Place deck of cards on the table. Flip cards over by opening your hand big to grasp and then turn your palm up big, opening hand fully to release.  Deal cards: Hold 1/2 or whole deck in your hand. Use thumb to push card off top of deck with one big push.  Flip card between each finger.  Place card on tabletop. Then flick fingers (extend fingers) powerfully to slide card off table (can have chair/box below table to catch the cards).  Rotate ball with fingertips: Pick up with fingers/thumb and move as much as you can with each turn/movement (clockwise and counter-clockwise).  Toss ball in the air and catch with the same hand: Toss big/high.  Deliberately open with toss and deliberately close hand after catch.  Rotate 2 golf balls in your hand: Both directions.  Pick up 5-10 coins one at a time and hold in palm (open hand before picking up each coin). Then, move coins from palm to fingertips one at a time to stack.

## 2018-08-08 ENCOUNTER — Encounter: Payer: Self-pay | Admitting: Occupational Therapy

## 2018-08-08 ENCOUNTER — Ambulatory Visit: Payer: Medicare Other | Admitting: Occupational Therapy

## 2018-08-08 DIAGNOSIS — R2689 Other abnormalities of gait and mobility: Secondary | ICD-10-CM

## 2018-08-08 DIAGNOSIS — R278 Other lack of coordination: Secondary | ICD-10-CM

## 2018-08-08 DIAGNOSIS — R29898 Other symptoms and signs involving the musculoskeletal system: Secondary | ICD-10-CM

## 2018-08-08 DIAGNOSIS — R29818 Other symptoms and signs involving the nervous system: Secondary | ICD-10-CM | POA: Diagnosis not present

## 2018-08-08 DIAGNOSIS — R2681 Unsteadiness on feet: Secondary | ICD-10-CM

## 2018-08-08 DIAGNOSIS — R41840 Attention and concentration deficit: Secondary | ICD-10-CM | POA: Diagnosis not present

## 2018-08-08 DIAGNOSIS — R293 Abnormal posture: Secondary | ICD-10-CM | POA: Diagnosis not present

## 2018-08-08 DIAGNOSIS — R251 Tremor, unspecified: Secondary | ICD-10-CM | POA: Diagnosis not present

## 2018-08-08 DIAGNOSIS — M79641 Pain in right hand: Secondary | ICD-10-CM

## 2018-08-08 DIAGNOSIS — M79642 Pain in left hand: Secondary | ICD-10-CM

## 2018-08-08 NOTE — Therapy (Signed)
[facility] [address] [place], [place], [address] Phone: [phone]   Fax:  [phone]  Occupational Therapy Treatment  Patient Details  Name: Kristin [name] MRN: [id number] Date of Birth: June 01, 1951 Referring Provider (OT): Dr. [name] Tat   Encounter Date: 08/08/2018  OT End of Session - 08/08/18 1529    Visit Number  3    Number of Visits  17    Date for OT Re-Evaluation  09/27/18    Authorization Type  Medicare / [employer]. date 07/29/18-10/27/18    Authorization - Visit Number  3    Authorization - Number of Visits  10    OT Start Time  1320    OT Stop Time  1406    OT Time Calculation (min)  46 min    Activity Tolerance  Patient tolerated treatment well    Behavior During Therapy  WFL for tasks assessed/performed       Past Medical History:  Diagnosis Date  . Abnormal vaginal Pap smear   . Acute pharyngitis 02/25/2013  . Anemia   . Anxiety   . Arthritis   . 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 Chicken pox as a child  . Chronic UTI    sees dr Terance Hart  . Constipation 12/07/2015  . Depression with anxiety 11/02/2009   Qualifier: Diagnosis of  By: [name], LPN, [name]   . Dermatitis 07/17/2012  . Esophageal stricture 1994  . Fibroids   . Foot pain, bilateral 06/19/2012  . GERD (gastroesophageal reflux disease)   . GERD (gastroesophageal reflux disease)   . Hiatal hernia   . Hyperglycemia 08/19/2013  . 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 Hyperlipidemia   . Hypertension   . Infertility, female   . Low back pain 10/17/2007   Qualifier: Diagnosis of  By: [name], [name]   . Measles as a child  . Obesity   . Osteoarthritis   . Parkinson disease ([place])   . Plantar fasciitis of left foot 06/19/2012  . Preventative health care 12/19/2015  . Rheumatoid arthritis ([place]) 01/08/2018  . Rosacea 10/05/2014  . Swallowing difficulty   . Urinary  frequency 02/25/2013  . Visual floaters 05/11/2014    Past Surgical History:  Procedure Laterality Date  . ABDOMINAL HYSTERECTOMY  2006   total  . esophageal     stretching  . laporoscopy    . PARTIAL HIP ARTHROPLASTY  2010   right  . TONSILLECTOMY    . wisdom teeth extracted      There were no vitals filed for this visit.  Subjective Assessment - 08/08/18 1528    Subjective   Pt reports that flipping cards through fingers went better    Pertinent History  Parkinson's disease; RA diagnosis (spring 2019), depression, anxiety, HTN, OA, GERD, prediabetes, vitamin D deficiency, basal cell carcinoma of skin, hyperhydrosis, hyperlipidemia    Limitations  RA with hand and foot pain    Patient Stated Goals  improve writing, ability to open things, doing eye make-up, folding laundry, typing, unloading dishwasher, using screwdiver, donning shoes/socks    Currently in Pain?  No/denies        Practiced donning shoes/socks.  Pt able to don/doff with min difficulty, particularly R.  Pt used chair to prop foot to assist in donning/tying shoe.  Pt appeared to demo incr difficulty due to recommended change in lacing with new  shoes.  Pt reports incr difficulty at home.  Discussed/made recommendations including taking medication as soon as she wakes up to allow time for medication to work and performing stretches prior to dressing (PWR! Supine or sitting, reach for the floor stretch).  Reviewed pt's current morning routine and made recommendations.  Pt reports that she feels that her medication wears off prior to 2nd medication dose.  Discussed timing of medication with meals and ADLs and assisted pt in problem-solving adjusted medication schedule now that first dose is earlier (6, 11, 4, 9/9:30).  Recommended pt trial to see if this helps as pt initially waited 6 hrs between first and second dose.  Pt reports feeling guilty about not doing [employer].  Discussed/educated pt that with RA,  resistive/repetitive exercise with decr joint stability and incr risk of joint deformity and incr pain.    Reviewed flipping card between each finger with each hand.  Pt with min-mod difficulty, but improved from last session.  Reviewed previous coordination HEP and recommended pt continue with one issued last session as it is a little more concise.  Pt verbalized understanding.                       OT Short Term Goals - 07/30/18 1239      OT SHORT TERM GOAL #1   Title  Pt will be independent with updated HEP.--check STGs 08/27/18    Time  4    Period  Weeks    Status  New      OT SHORT TERM GOAL #2   Title  Pt will report unloading dishwasher as only "minimally difficult" (2)    Baseline  somewhat difficult (3)    Time  4    Period  Weeks    Status  New      OT SHORT TERM GOAL #3   Title  Pt will perform PPT#4 in less than 20 sec for incr ease with dressing.    Baseline  23.38sec    Time  4    Period  Weeks    Status  New      OT SHORT TERM GOAL #4   Title  Pt will report opening containers/boxes as only "somewhat difficult" (3)    Baseline  mod difficult (4)    Time  4    Period  Weeks    Status  New      OT SHORT TERM GOAL #5   Title  Pt will report folding laundry as "minimally difficult" (2)    Baseline  somewhat difficult (3)    Time  4    Period  Weeks    Status  New        OT Long Term Goals - 07/30/18 1242      OT LONG TERM GOAL #1   Title  Pt will verbalize understanding of adaptive stratgies to incr. ease/efficiency and decr pain with ADLs/IADLs--check LTGs 09/27/17    Time  8    Period  Weeks    Status  New      OT LONG TERM GOAL #2   Title  Pt will report typing on computer as "somewhat difficult" (3)    Baseline  mod difficult (4)    Time  8    Period  Weeks    Status  New      OT LONG TERM GOAL #3   Title  Pt will improve functional reaching/coordination for ADLs as  shown by improving score on box and blocks test by at  least 3blocks with RUE.    Baseline  R-54 blocks    Time  8    Period  Weeks    Status  New      OT LONG TERM GOAL #4   Title  Pt will report donning R shoe/sock as only "somewhat difficult" (3).    Baseline  rated as very difficult (5) on Functional tasks recording form    Period  Weeks    Status  New      OT LONG TERM GOAL #5   Title  Pt will be able to write 4-5 sentences with 100% legibility and rate as "somewhat difficult".    Baseline  very difficult, 90%    Time  8    Period  Weeks    Status  New      OT LONG TERM GOAL #6   Title  Pt will report unscrewing small screw as "somewhat difficult (3)."    Baseline  very difficult (5)    Time  8    Period  Weeks    Status  New            Plan - 08/08/18 1530    Clinical Impression Statement  Pt demo good ability to don/doff shoes/socks today, but reports incr difficulty at home.  Pt motivated and committed to use of strategies recommended.      Occupational Profile and client history currently impacting functional performance  Pt is independent with ADLs/IADLs, but now reports pain in hands affecting ADLs and incr difficulty for ADLs/IADLs and ability to participate in social/leisure activities as she previously has.    Occupational performance deficits (Please refer to evaluation for details):  ADL's;IADL's;Leisure;Social Participation    Rehab Potential  Good    OT Frequency  2x / week    OT Duration  8 weeks   +eval (or 16 visits)   OT Treatment/Interventions  Self-care/ADL training;Therapeutic exercise;Moist Heat;Paraffin;Neuromuscular education;Splinting;Patient/family education;Therapeutic activities;Functional Mobility Training;Energy conservation;Fluidtherapy;Cryotherapy;Ultrasound;Contrast Bath;DME and/or AE instruction;Manual Therapy;Passive range of motion;Cognitive remediation/compensation;Balance training    Plan  strategies for ADLs/IADLs; writing; small screwdriver use; open containers; folding laundry     Clinical Decision Making  Several treatment options, min-mod task modification necessary    Recommended Other Services  recent d/c from PT    Consulted and Agree with Plan of Care  Patient       Patient will benefit from skilled therapeutic intervention in order to improve the following deficits and impairments:  Decreased cognition, Decreased knowledge of use of DME, Pain, Decreased mobility, Decreased coordination, Decreased activity tolerance, Decreased range of motion, Impaired tone, Improper spinal/pelvic alignment, Impaired UE functional use, Decreased knowledge of precautions, Decreased balance(bradykinesia)  Visit Diagnosis: Other symptoms and signs involving the nervous system  Other symptoms and signs involving the musculoskeletal system  Other lack of coordination  Abnormal posture  Attention and concentration deficit  Other abnormalities of gait and mobility  Unsteadiness on feet  Pain in left hand  Pain in right hand    Problem List Patient Active Problem List   Diagnosis Date Noted  . Cough 05/10/2018  . Rheumatoid arthritis ([place]) 01/08/2018  . Hand pain, right 10/15/2017  . Vitamin D deficiency 12/20/2016  . Prediabetes 12/20/2016  . Shortness of breath on exertion 11/27/2016  . Preventative health care 12/19/2015  . Constipation 12/07/2015  . BCC (basal cell carcinoma of skin) 10/05/2014  . Rosacea 10/05/2014  . Parkinson's disease (  [place]) 05/11/2014  . Visual floaters 05/11/2014  . Paralysis agitans ([place]) 01/08/2014  . Screening for cervical cancer 07/17/2012  . Foot pain, bilateral 06/19/2012  . 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 Obesity 11/12/2009  . Depression with anxiety 11/02/2009  . 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 Lumbar back pain with radiculopathy affecting lower extremity 10/17/2007  . Essential hypertension 07/05/2007  . Osteoarthritis 07/05/2007  . Hyperlipidemia 06/26/2007  . H/O: iron deficiency anemia 05/08/2007   . GERD 05/08/2007    [facility] 08/08/2018, 3:43 PM  [facility] [address] [place], [place], [address] Phone: [phone]   Fax:  [phone]  Name: Kristin [name] MRN: [id number] Date of Birth: 07/04/1951   Vianne Bulls, OTR/L [facility] [address] [place], [place]  [address] [phone] phone [phone] 08/08/18 3:43 PM

## 2018-08-09 DIAGNOSIS — F331 Major depressive disorder, recurrent, moderate: Secondary | ICD-10-CM | POA: Diagnosis not present

## 2018-08-13 ENCOUNTER — Ambulatory Visit: Payer: Medicare Other | Admitting: Occupational Therapy

## 2018-08-13 ENCOUNTER — Encounter: Payer: Self-pay | Admitting: Occupational Therapy

## 2018-08-13 DIAGNOSIS — R29898 Other symptoms and signs involving the musculoskeletal system: Secondary | ICD-10-CM | POA: Diagnosis not present

## 2018-08-13 DIAGNOSIS — R251 Tremor, unspecified: Secondary | ICD-10-CM | POA: Diagnosis not present

## 2018-08-13 DIAGNOSIS — R29818 Other symptoms and signs involving the nervous system: Secondary | ICD-10-CM

## 2018-08-13 DIAGNOSIS — R41840 Attention and concentration deficit: Secondary | ICD-10-CM | POA: Diagnosis not present

## 2018-08-13 DIAGNOSIS — R278 Other lack of coordination: Secondary | ICD-10-CM | POA: Diagnosis not present

## 2018-08-13 DIAGNOSIS — R2689 Other abnormalities of gait and mobility: Secondary | ICD-10-CM

## 2018-08-13 DIAGNOSIS — R293 Abnormal posture: Secondary | ICD-10-CM

## 2018-08-13 DIAGNOSIS — R2681 Unsteadiness on feet: Secondary | ICD-10-CM

## 2018-08-13 NOTE — Therapy (Signed)
[facility] [address] [place], [place], [address] Phone: [phone]   Fax:  [phone]  Occupational Therapy Treatment  Patient Details  Name: Kristin [name] MRN: [id number] Date of Birth: 16-Jul-1951 Referring Provider (OT): Dr. [name] Tat   Encounter Date: 08/13/2018  OT End of Session - 08/13/18 1538    Visit Number  4    Number of Visits  17    Date for OT Re-Evaluation  09/27/18    Authorization Type  Medicare / [employer]. date 07/29/18-10/27/18    Authorization - Visit Number  4    Authorization - Number of Visits  10    OT Start Time  1021    OT Stop Time  1102    OT Time Calculation (min)  41 min    Activity Tolerance  Patient tolerated treatment well    Behavior During Therapy  WFL for tasks assessed/performed       Past Medical History:  Diagnosis Date  . Abnormal vaginal Pap smear   . Acute pharyngitis 02/25/2013  . Anemia   . Anxiety   . Arthritis   . 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 Chicken pox as a child  . Chronic UTI    sees dr Terance Hart  . Constipation 12/07/2015  . Depression with anxiety 11/02/2009   Qualifier: Diagnosis of  By: [name], LPN, [name]   . Dermatitis 07/17/2012  . Esophageal stricture 1994  . Fibroids   . Foot pain, bilateral 06/19/2012  . GERD (gastroesophageal reflux disease)   . GERD (gastroesophageal reflux disease)   . Hiatal hernia   . Hyperglycemia 08/19/2013  . 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 Hyperlipidemia   . Hypertension   . Infertility, female   . Low back pain 10/17/2007   Qualifier: Diagnosis of  By: [name], [name]   . Measles as a child  . Obesity   . Osteoarthritis   . Parkinson disease ([place])   . Plantar fasciitis of left foot 06/19/2012  . Preventative health care 12/19/2015  . Rheumatoid arthritis ([place]) 01/08/2018  . Rosacea 10/05/2014  . Swallowing difficulty   . Urinary  frequency 02/25/2013  . Visual floaters 05/11/2014    Past Surgical History:  Procedure Laterality Date  . ABDOMINAL HYSTERECTOMY  2006   total  . esophageal     stretching  . laporoscopy    . PARTIAL HIP ARTHROPLASTY  2010   right  . TONSILLECTOMY    . wisdom teeth extracted      There were no vitals filed for this visit.  Subjective Assessment - 08/13/18 1027    Subjective   Pt reports that she found fat mascara that works better    Pertinent History  Parkinson's disease; RA diagnosis (spring 2019), depression, anxiety, HTN, OA, GERD, prediabetes, vitamin D deficiency, basal cell carcinoma of skin, hyperhydrosis, hyperlipidemia    Limitations  RA with hand and foot pain    Patient Stated Goals  improve writing, ability to open things, doing eye make-up, folding laundry, typing, unloading dishwasher, using screwdiver, donning shoes/socks    Currently in Pain?  No/denies        Pt reports improvement with shoes/socks and is trying to adjust her medication schedule as discussed last session.  Reviewed recommendation to perform PWR! Moves prior to getting dressed.  Reviewed recommendations from last session and progress.  Recommended use  of spring-loaded kitchen scissors to help with opening packages.  Pt educated in joint protection techniques including proper positioning of carrying heavier objects to decr stress on joints and decr pain.  Educated pt in how use of these techniques also helps with PD rigidity/bradykinesia.  Pt verbalized understanding/returned demo.  Recommended building up tools/objects to decr stress on joints and improve control.  Issued foam grips and educated pt in use of coban to help build up pens, make-up pencils, etc.  Pt reports incr comfort/control with pen with use of coban.    Practiced writing with coban on pen.  Pt reports that she feels less control in general with writing although good legibility/size.  Recommended trial of felt-tip pen to incr  control as size is good.  Pt returned demo in clinic and will explore purchasing.    Recommended pt bring food container to next session to practice.            OT Short Term Goals - 07/30/18 1239      OT SHORT TERM GOAL #1   Title  Pt will be independent with updated HEP.--check STGs 08/27/18    Time  4    Period  Weeks    Status  New      OT SHORT TERM GOAL #2   Title  Pt will report unloading dishwasher as only "minimally difficult" (2)    Baseline  somewhat difficult (3)    Time  4    Period  Weeks    Status  New      OT SHORT TERM GOAL #3   Title  Pt will perform PPT#4 in less than 20 sec for incr ease with dressing.    Baseline  23.38sec    Time  4    Period  Weeks    Status  New      OT SHORT TERM GOAL #4   Title  Pt will report opening containers/boxes as only "somewhat difficult" (3)    Baseline  mod difficult (4)    Time  4    Period  Weeks    Status  New      OT SHORT TERM GOAL #5   Title  Pt will report folding laundry as "minimally difficult" (2)    Baseline  somewhat difficult (3)    Time  4    Period  Weeks    Status  New        OT Long Term Goals - 07/30/18 1242      OT LONG TERM GOAL #1   Title  Pt will verbalize understanding of adaptive stratgies to incr. ease/efficiency and decr pain with ADLs/IADLs--check LTGs 09/27/17    Time  8    Period  Weeks    Status  New      OT LONG TERM GOAL #2   Title  Pt will report typing on computer as "somewhat difficult" (3)    Baseline  mod difficult (4)    Time  8    Period  Weeks    Status  New      OT LONG TERM GOAL #3   Title  Pt will improve functional reaching/coordination for ADLs as shown by improving score on box and blocks test by at least 3blocks with RUE.    Baseline  R-54 blocks    Time  8    Period  Weeks    Status  New      OT LONG TERM GOAL #4  Title  Pt will report donning R shoe/sock as only "somewhat difficult" (3).    Baseline  rated as very difficult (5) on Functional  tasks recording form    Period  Weeks    Status  New      OT LONG TERM GOAL #5   Title  Pt will be able to write 4-5 sentences with 100% legibility and rate as "somewhat difficult".    Baseline  very difficult, 90%    Time  8    Period  Weeks    Status  New      OT LONG TERM GOAL #6   Title  Pt will report unscrewing small screw as "somewhat difficult (3)."    Baseline  very difficult (5)    Time  8    Period  Weeks    Status  New            Plan - 08/13/18 1024    Clinical Impression Statement  Pt demo good response to strategies for writing today and verbalized understanding of strategies for ADLs.  Pt is progressing towards goals.    Occupational Profile and client history currently impacting functional performance  Pt is independent with ADLs/IADLs, but now reports pain in hands affecting ADLs and incr difficulty for ADLs/IADLs and ability to participate in social/leisure activities as she previously has.    Occupational performance deficits (Please refer to evaluation for details):  ADL's;IADL's;Leisure;Social Participation    Rehab Potential  Good    OT Frequency  2x / week    OT Duration  8 weeks   +eval (or 16 visits)   OT Treatment/Interventions  Self-care/ADL training;Therapeutic exercise;Moist Heat;Paraffin;Neuromuscular education;Splinting;Patient/family education;Therapeutic activities;Functional Mobility Training;Energy conservation;Fluidtherapy;Cryotherapy;Ultrasound;Contrast Bath;DME and/or AE instruction;Manual Therapy;Passive range of motion;Cognitive remediation/compensation;Balance training    Plan  strategies for ADLs/IADLs; continue with writing; small screwdriver use; open containers; folding laundry    Clinical Decision Making  Several treatment options, min-mod task modification necessary    Recommended Other Services  recent d/c from PT    Consulted and Agree with Plan of Care  Patient       Patient will benefit from skilled therapeutic intervention  in order to improve the following deficits and impairments:  Decreased cognition, Decreased knowledge of use of DME, Pain, Decreased mobility, Decreased coordination, Decreased activity tolerance, Decreased range of motion, Impaired tone, Improper spinal/pelvic alignment, Impaired UE functional use, Decreased knowledge of precautions, Decreased balance(bradykinesia)  Visit Diagnosis: Other symptoms and signs involving the nervous system  Other symptoms and signs involving the musculoskeletal system  Other lack of coordination  Abnormal posture  Attention and concentration deficit  Other abnormalities of gait and mobility  Unsteadiness on feet  Tremor    Problem List Patient Active Problem List   Diagnosis Date Noted  . Cough 05/10/2018  . Rheumatoid arthritis ([place]) 01/08/2018  . Hand pain, right 10/15/2017  . Vitamin D deficiency 12/20/2016  . Prediabetes 12/20/2016  . Shortness of breath on exertion 11/27/2016  . Preventative health care 12/19/2015  . Constipation 12/07/2015  . BCC (basal cell carcinoma of skin) 10/05/2014  . Rosacea 10/05/2014  . Parkinson's disease ([place]) 05/11/2014  . Visual floaters 05/11/2014  . Paralysis agitans ([place]) 01/08/2014  . Screening for cervical cancer 07/17/2012  . Foot pain, bilateral 06/19/2012  . 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 Obesity 11/12/2009  . Depression with anxiety 11/02/2009  . 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 Lumbar back pain with radiculopathy affecting lower extremity 10/17/2007  . Essential hypertension  07/05/2007  . Osteoarthritis 07/05/2007  . Hyperlipidemia 06/26/2007  . H/O: iron deficiency anemia 05/08/2007  . GERD 05/08/2007    [facility] 08/13/2018, 3:53 PM  [facility] [address] [place], [place], [address] Phone: [phone]   Fax:  [phone]  Name: Kristin [name] MRN: [id number] Date of Birth: 1950-09-29    Vianne Bulls, OTR/L [facility] [address] [place], [place]  [address] [phone] phone [phone] 08/13/18 3:53 PM

## 2018-08-15 ENCOUNTER — Encounter: Payer: Self-pay | Admitting: Neurology

## 2018-08-15 ENCOUNTER — Ambulatory Visit: Payer: Medicare Other | Admitting: Occupational Therapy

## 2018-08-15 ENCOUNTER — Other Ambulatory Visit: Payer: Self-pay | Admitting: Family Medicine

## 2018-08-15 ENCOUNTER — Encounter: Payer: Self-pay | Admitting: Occupational Therapy

## 2018-08-15 ENCOUNTER — Ambulatory Visit (INDEPENDENT_AMBULATORY_CARE_PROVIDER_SITE_OTHER): Payer: Medicare Other | Admitting: Neurology

## 2018-08-15 VITALS — BP 124/72 | HR 84 | Ht 64.0 in | Wt 244.0 lb

## 2018-08-15 DIAGNOSIS — R278 Other lack of coordination: Secondary | ICD-10-CM

## 2018-08-15 DIAGNOSIS — R293 Abnormal posture: Secondary | ICD-10-CM

## 2018-08-15 DIAGNOSIS — R251 Tremor, unspecified: Secondary | ICD-10-CM | POA: Diagnosis not present

## 2018-08-15 DIAGNOSIS — M79641 Pain in right hand: Secondary | ICD-10-CM

## 2018-08-15 DIAGNOSIS — M79642 Pain in left hand: Secondary | ICD-10-CM

## 2018-08-15 DIAGNOSIS — G2581 Restless legs syndrome: Secondary | ICD-10-CM | POA: Diagnosis not present

## 2018-08-15 DIAGNOSIS — R29818 Other symptoms and signs involving the nervous system: Secondary | ICD-10-CM | POA: Diagnosis not present

## 2018-08-15 DIAGNOSIS — R41840 Attention and concentration deficit: Secondary | ICD-10-CM

## 2018-08-15 DIAGNOSIS — G20 Parkinson's disease: Secondary | ICD-10-CM

## 2018-08-15 DIAGNOSIS — R2681 Unsteadiness on feet: Secondary | ICD-10-CM

## 2018-08-15 DIAGNOSIS — R2689 Other abnormalities of gait and mobility: Secondary | ICD-10-CM

## 2018-08-15 DIAGNOSIS — R29898 Other symptoms and signs involving the musculoskeletal system: Secondary | ICD-10-CM

## 2018-08-15 NOTE — Patient Instructions (Signed)
1.  reduce pramipexole to 0.5 mg, 1 tablet three times per day

## 2018-08-15 NOTE — Therapy (Signed)
[facility] [address] [place], [place], [address] Phone: [phone]   Fax:  [phone]  Occupational Therapy Treatment  Patient Details  Name: GLORIE DOWLEN MRN: [id number] Date of Birth: December 11, 1950 Referring Provider (OT): Dr. [name] Tat   Encounter Date: 08/15/2018  OT End of Session - 08/15/18 1348    Visit Number  5    Number of Visits  17    Date for OT Re-Evaluation  09/27/18    Authorization Type  Medicare / [employer]. date 07/29/18-10/27/18    Authorization - Visit Number  5    Authorization - Number of Visits  10    OT Start Time  6237    OT Stop Time  1408    OT Time Calculation (min)  47 min    Activity Tolerance  Patient tolerated treatment well    Behavior During Therapy  WFL for tasks assessed/performed       Past Medical History:  Diagnosis Date  . Abnormal vaginal Pap smear   . Acute pharyngitis 02/25/2013  . Anemia   . Anxiety   . Arthritis   . 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 Chicken pox as a child  . Chronic UTI    sees dr Terance Hart  . Constipation 12/07/2015  . Depression with anxiety 11/02/2009   Qualifier: Diagnosis of  By: [name], LPN, [name]   . Dermatitis 07/17/2012  . Esophageal stricture 1994  . Fibroids   . Foot pain, bilateral 06/19/2012  . GERD (gastroesophageal reflux disease)   . GERD (gastroesophageal reflux disease)   . Hiatal hernia   . Hyperglycemia 08/19/2013  . 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 Hyperlipidemia   . Hypertension   . Infertility, female   . Low back pain 10/17/2007   Qualifier: Diagnosis of  By: [name], [name]   . Measles as a child  . Obesity   . Osteoarthritis   . Parkinson disease ([place])   . Plantar fasciitis of left foot 06/19/2012  . Preventative health care 12/19/2015  . Rheumatoid arthritis ([place]) 01/08/2018  . Rosacea 10/05/2014  . Swallowing difficulty   . Urinary  frequency 02/25/2013  . Visual floaters 05/11/2014    Past Surgical History:  Procedure Laterality Date  . ABDOMINAL HYSTERECTOMY  2006   total  . esophageal     stretching  . laporoscopy    . PARTIAL HIP ARTHROPLASTY  2010   right  . TONSILLECTOMY    . wisdom teeth extracted      There were no vitals filed for this visit.  Subjective Assessment - 08/15/18 1321    Subjective   Pt reports that she brought in some things that she is having difficulty with    Pertinent History  Parkinson's disease; RA diagnosis (spring 2019), depression, anxiety, HTN, OA, GERD, prediabetes, vitamin D deficiency, basal cell carcinoma of skin, hyperhydrosis, hyperlipidemia    Limitations  RA with hand and foot pain    Patient Stated Goals  improve writing, ability to open things, doing eye make-up, folding laundry, typing, unloading dishwasher, using screwdiver, donning shoes/socks    Currently in Pain?  No/denies       Reviewed strategies for writing. And discussed use of coban and keeping pen/screwdriver upright.    Practiced opening/closing food container with lid (recommended open hand and slide thumb underneath, placing on table).  Practiced opening/closing medication  bottle with use of palm to put on/off to assist with alignment.  Also recommended use of shelf line to help with building up objects, stabilize objects, and assist with opening objects.  Recommended that pt could explore non-childproof medication bottles/request at pharmacy.    Pt able to don/doff R shoe/sock using shoe horn in reasonable amount of time using adaptive strategies.    Pt reports moving things around a lot and cleaning a lot when she has never been someone who has done this and that it is bothersome at times.  Pt reports that she has an appt with Dr. Carles Collet today.  Recommended that she talk to Dr. Carles Collet about this as it could be obsessive behaviors related to medication that may need to be monitored.   Discussed laundry.   Recommended pt fold and hang clothes by laying them on table to incr ease and decr stress on joints.  Also recommended pt fold more vertically and focus on opening hand big to grasp clothes.  Recommended pt get husband to assist with sheets to incr ease.    Pt made notes regarding strategies and will attempt at home as well as reinforcing in clinic.  Pt also to bring in small screwdriver and items that she has trouble manipulating for practice next session.       OT Short Term Goals - 07/30/18 1239      OT SHORT TERM GOAL #1   Title  Pt will be independent with updated HEP.--check STGs 08/27/18    Time  4    Period  Weeks    Status  New      OT SHORT TERM GOAL #2   Title  Pt will report unloading dishwasher as only "minimally difficult" (2)    Baseline  somewhat difficult (3)    Time  4    Period  Weeks    Status  New      OT SHORT TERM GOAL #3   Title  Pt will perform PPT#4 in less than 20 sec for incr ease with dressing.    Baseline  23.38sec    Time  4    Period  Weeks    Status  New      OT SHORT TERM GOAL #4   Title  Pt will report opening containers/boxes as only "somewhat difficult" (3)    Baseline  mod difficult (4)    Time  4    Period  Weeks    Status  New      OT SHORT TERM GOAL #5   Title  Pt will report folding laundry as "minimally difficult" (2)    Baseline  somewhat difficult (3)    Time  4    Period  Weeks    Status  New        OT Long Term Goals - 07/30/18 1242      OT LONG TERM GOAL #1   Title  Pt will verbalize understanding of adaptive stratgies to incr. ease/efficiency and decr pain with ADLs/IADLs--check LTGs 09/27/17    Time  8    Period  Weeks    Status  New      OT LONG TERM GOAL #2   Title  Pt will report typing on computer as "somewhat difficult" (3)    Baseline  mod difficult (4)    Time  8    Period  Weeks    Status  New      OT LONG TERM GOAL #3  Title  Pt will improve functional reaching/coordination for ADLs as shown by  improving score on box and blocks test by at least 3blocks with RUE.    Baseline  R-54 blocks    Time  8    Period  Weeks    Status  New      OT LONG TERM GOAL #4   Title  Pt will report donning R shoe/sock as only "somewhat difficult" (3).    Baseline  rated as very difficult (5) on Functional tasks recording form    Period  Weeks    Status  New      OT LONG TERM GOAL #5   Title  Pt will be able to write 4-5 sentences with 100% legibility and rate as "somewhat difficult".    Baseline  very difficult, 90%    Time  8    Period  Weeks    Status  New      OT LONG TERM GOAL #6   Title  Pt will report unscrewing small screw as "somewhat difficult (3)."    Baseline  very difficult (5)    Time  8    Period  Weeks    Status  New            Plan - 08/15/18 1402    Clinical Impression Statement  Pt is progressing towards goals.  Pt is implementing adaptive strategies for ADLs as discussed and reports incr ease.      Occupational Profile and client history currently impacting functional performance  Pt is independent with ADLs/IADLs, but now reports pain in hands affecting ADLs and incr difficulty for ADLs/IADLs and ability to participate in social/leisure activities as she previously has.    Occupational performance deficits (Please refer to evaluation for details):  ADL's;IADL's;Leisure;Social Participation    Rehab Potential  Good    OT Frequency  2x / week    OT Duration  8 weeks   +eval (or 16 visits)   OT Treatment/Interventions  Self-care/ADL training;Therapeutic exercise;Moist Heat;Paraffin;Neuromuscular education;Splinting;Patient/family education;Therapeutic activities;Functional Mobility Training;Energy conservation;Fluidtherapy;Cryotherapy;Ultrasound;Contrast Bath;DME and/or AE instruction;Manual Therapy;Passive range of motion;Cognitive remediation/compensation;Balance training    Plan  strategies for ADLs/IADLs; small screwdriver use;  folding laundry/sheets; jacket;  load/unload dishwasher    Clinical Decision Making  Several treatment options, min-mod task modification necessary    Recommended Other Services  recent d/c from PT    Consulted and Agree with Plan of Care  Patient       Patient will benefit from skilled therapeutic intervention in order to improve the following deficits and impairments:  Decreased cognition, Decreased knowledge of use of DME, Pain, Decreased mobility, Decreased coordination, Decreased activity tolerance, Decreased range of motion, Impaired tone, Improper spinal/pelvic alignment, Impaired UE functional use, Decreased knowledge of precautions, Decreased balance(bradykinesia)  Visit Diagnosis: Other symptoms and signs involving the nervous system  Other symptoms and signs involving the musculoskeletal system  Other lack of coordination  Abnormal posture  Attention and concentration deficit  Other abnormalities of gait and mobility  Unsteadiness on feet  Tremor  Pain in left hand  Pain in right hand    Problem List Patient Active Problem List   Diagnosis Date Noted  . Cough 05/10/2018  . Rheumatoid arthritis ([place]) 01/08/2018  . Hand pain, right 10/15/2017  . Vitamin D deficiency 12/20/2016  . Prediabetes 12/20/2016  . Shortness of breath on exertion 11/27/2016  . Preventative health care 12/19/2015  . Constipation 12/07/2015  . BCC (basal cell carcinoma of skin) 10/05/2014  .  Rosacea 10/05/2014  . Parkinson's disease ([place]) 05/11/2014  . Visual floaters 05/11/2014  . Paralysis agitans ([place]) 01/08/2014  . Screening for cervical cancer 07/17/2012  . Foot pain, bilateral 06/19/2012  . 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 Obesity 11/12/2009  . Depression with anxiety 11/02/2009  . 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 Lumbar back pain with radiculopathy affecting lower extremity 10/17/2007  . Essential hypertension 07/05/2007  . Osteoarthritis 07/05/2007  . Hyperlipidemia 06/26/2007  .  H/O: iron deficiency anemia 05/08/2007  . GERD 05/08/2007    [facility] 08/15/2018, 6:45 PM  [facility] [address] [place], [place], [address] Phone: [phone]   Fax:  [phone]  Name: JOSELINE MCCAMPBELL MRN: [id number] Date of Birth: December 20, 1950   Vianne Bulls, OTR/L [facility] [address] [place], [place]  [address] [phone] phone [phone] 08/15/18 6:45 PM

## 2018-08-15 NOTE — Progress Notes (Signed)
Subjective:    Kristin [name] was seen in consultation in the movement disorder clinic at the request of Mosie Lukes, MD.  The evaluation is for tremor.  The patient is a 67 y.o. left handed female with a history of tremor.  The records that were made available to me were reviewed.  According to records, the patient has complained about tremor to her previous primary care physician all the way back to 2012, but in those records she had said that tremor had been going on for years.  She states today, however, that she really thinks that it started in 2012.  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  R hand was always worse even though she is L hand dominant.  It has slowly progressed.  She reports that tremor is in both hands now (R still worse) but she feels tremor on the inside of the body all of the time.  She has tremor at rest now.  She feels nervous now and is unsure if it was related.  She also has the sweats and so she was placed on xanax but that doesn't help the sweating.  She recently went to an exercise class and noted that she wasn't as coordinated/strong with the R hand and that worried her and that is why she wanted a further evaluation.    She did see [name] in June, 2013 and I reviewed that note.  She was diagnosed with essential tremor.  No treatment was recommended.  There is  family hx of tremor in her father.  She states that she has been on metoprolol since 2012 and didn't think that it helped but when she tried to get off of it a year ago, the tremor got worse and she couldn't get off of it.    03/09/14 update:  The patient presents today for follow up.  Today, she is accompanied by her husband who helps to supplement the history.  He was not present on the prior visit.  She was diagnosed with PD last visit.  I started her on Mirapex.  She has been doing better in terms of tremor.  Interestingly, sweats and sleep have been better.  She  just retired last week.  No side effects with the Mirapex.  She is currently taking it at 9 AM/2 PM/9 PM. No compulsive behaviors.  No sleep attacks.  She has been attending therapy.  She is planning on starting the Parkinson's exercise class.  She has been educating herself and reading patient education material.  She had a modified barium evaluation on 02/25/2014.  It was normal, although they suspected that she had mild esophageal dysphagia.  Her husband asks multiple questions today and asked about potentially having another opinion.  The patient has had no falls.  No hallucinations.  07/15/14 update:  Pt is f/u today, accompanied by her husband who supplements the history.  Pt is on mirapex 0.5 mg three times per day.  I stopped her metoprolol last visit. She did fine with that. She is exercising with the Moves class and with circuit II class.  She is also enrolled in the bike class at the [facility].  She is having some soreness because of the exercise and hip pain because of it and she has had a hip replacement and worries about that as she doesn't want to have another.  Thinking about trying chiropractics for that.  She asks me about some.  Stages that she has on the lateral aspect of the knees.  She does not wear boots.  She has not had a knee replacement.  She also complains of some achiness in her toes and it gets better as she walks.  No falls.  No lightheadedness.    11/11/14 update:  The patient returns today for follow-up.  She is on Mirapex, 0.5 mg 3 times per day.  Since our last visit, the patient did go to [facility] for a second opinion.  She saw Dr. [name].  This was just 2 days ago.  No notes are available.  They state that they had a good visit, are glad that they went and got a second opinion and no new recommendations/treatments were given.  She has been doing well at home.  She is very active in our community exercise programs for PD.  She is enrolled in the [facility] exercise program and in our  circuit class and is walking on the treadmill.  She notices tremor in public but generally not as much at home.  She notes wearing off of the medication but only when time for the next dosage.    02/09/15 update:  The patient is following up today regarding her Parkinson's disease.  She is accompanied by her husband who supplements the history.  She did get a second opinion at [place] with Dr. [name] and his fellow, and [name], on 11/17/2014.  I reviewed those notes.  I told her that she could increase the Mirapex if needed to control tremor further.  She is currently on Mirapex 0.5 mg 3 times per day (7am/1:30 pm/7:00).  She is on Lexapro and Cymbalta for depression.  She is doing exercise.   She went to circuit class for PD today and she will have tremor when doing that.  She notes that when doing exercise, she has more tremor.  No falls.  She went to [place] for 2 weeks with a tour group and she felt that she was able to keep up with the group.  She felt better on this trip than she did 2 years ago on a trip.  05/12/15 update:  The patient falls up today regarding her Parkinson's disease. She is accompanied by her husband who supplements the history.   Last visit, I had her increase her Mirapex to 1 mg in the morning and she remained on 0.5 mg in the afternoon and 0.5 mg in the evening. She was initially tired with the increase in the medication but has adjusted now.  Her daughter and 8 month old grandson are living with them.  She is exercising on the treadmill.  She is doing the Parkinsons exercise class.  She is going to try the boxing class and is in the [employer] class and it is increasing to 2 days a week. She has been doing well with mood on a combination of Lexapro and Cymbalta.  She denies any falls.  No lightheadedness or near syncope.  No hallucinations.  08/12/15 update:  The patient follows up today, accompanied by her husband who supplements the history.  The patient is on pramipexole, 1.0 mg in the  morning, 0.5 mg in the afternoon and 0.5 mg in the evening.  She is also planning on attending the [facility] retreat as a patient, which should be very good for her.  She remains on Lexapro and Cymbalta.  They're working well for her.  She is thinking about getting a therapist and asks me about  that.  She also asks me about possible supplements for Parkinson's disease.  She continues to exercise a lot.  She has not had any falls.  No dyskinesia.  No lightheadedness or near syncope.  12/01/15 update:  The patient follows up today, accompanied by her husband who supplements the history.  The patient is on pramipexole, 1.0 mg in the morning, 0.5 mg in the afternoon and 0.5 mg in the evening.  She is also planning on attending the [facility] retreat as a patient, which should be very good for her.  Its in [place].  She remains on Lexapro and Cymbalta.  They're working well for her.  She continues to exercise a lot.  Had not been pole walking and just started and noted that her arms are very sore.  Still knows that she needs a therapist and asked about it last time and gave her Valeda Malm name but she hasn't done it.  She has not had any falls.  No dyskinesia.  No lightheadedness or near syncope.  She just saw Dr. [name] on 11/08/15 and I reviewed those notes.  No changes were made.    03/14/16 update:  The patient follows up today for her PD.  The patient is on pramipexole, 1.0 mg in the morning, 0.5 mg in the afternoon and 0.5 mg in the evening.  She attended the [facility] retreat in [place] as a patient.  She went at the end of May.  States that it was very challenging.   She remains on Lexapro and Cymbalta.  They're working well for her.  She continues to exercise a lot.  She has not had any falls.  No dyskinesia.  No lightheadedness or near syncope.  Noting some turning in of the L ankle. Not sure if related to timing of medication.   States that husband was dx with MDS since our last visit.  Because of that, she has not she has  not been able to attend PD therapy locally because she has been running back and forth to [place] with him.  Sister with dx with PD since our last visit.    10/31/16 update:  Patient follows up today.  She is on pramipexole, 1 mg in the morning, 0.5 mg in the afternoon and evening.  Last visit, we initiated levodopa and she worked her way to 1 tablet 3 times per day.  She has not had side effects.  No dyskinesia.  No falls.  Is having some dizziness or near syncope.  She admits that she is not hydrating well.  She is having more choking on water/liquid/saliva.  She is  planning to attend the [facility] retreat again in [place].  She has had a fairly rough time since our last visit, as she was in [place] with several months with her husband who had been awaiting a bone marrow transplant for leukemia.  He had this 06/2016.  He still won't leave the house because he is scared of germs but he has been given the clearance to do so.  She did join a gym and was exercising while she was in [place].  Pt is going to counseling, [employer] but her husband has been resistant to do so.  Pt went to social work PD support group.  She is getting back to exercise here.  She has started back to spears biking and [facility] classes.  She feels a bit defeated about how hard the boxing class is.   03/05/17 update:  Patient seen today  in follow-up.  She is on pramipexole, 1 mg in the morning and 0.5 mg in the afternoon and evening.  She is still on carbidopa/levodopa 25/100, one tablet 3 times per day.  Some cramping of the 2nd toe at night bilaterally.  She has been to the [employer] and [place] again since our last visit and did well with that.  Her sister did go with her.  Pt denies falls.  Finished PT and doing OT now.  Pt denies lightheadedness, near syncope.  No hallucinations.  Mood has been good. Still seeing Trevor Mace, her counselor.  She has an adult autistic son that has a college degree but has trouble keeping a job and that has  been stressful for her. Is seeing Dr. Leafy Ro now regarding weight loss.  Having some joint pain.  Going to see knee doctor as well.  Lets feel "itchy" at night and she is scratching them at night and getting exoriations on them from scratching.  06/19/17 update:  Patient today in follow-up for her Parkinson's disease.  Patient is on carbidopa/levodopa 25/100, one tablet 3 times per day.  We added carbidopa/levodopa 50/200 at bedtime last visit to see if that would help the restless leg and cramping of the feet and toes at night.  She states that helped a lot.  She is also on pramipexole, 1 mg in the morning and 0.5 mg in the afternoon and evening.  She did come to our Parkinson's symposium in July, accompanied by her husband.  She was pleased that he came to the event.  She denies any falls since our last visit.  She continues to exercise faithfully.  She is having a hard time emotionally doing everything for her husband.  She is going to counseling.  shes getting tired so she gave up boxing.  She is going to spin class.  She is eating to cope with depression.  10/23/17 update: The patient is seen today in follow-up for Parkinson's disease.  This patient is accompanied in the office by her spouse who supplements the history.  She is on carbidopa/levodopa 25/100, 1 tablet 3 times per day and carbidopa/levodopa 50/200 at bedtime.  She is also on pramipexole, 1 mg in the morning and 0.5 mg in the afternoon and evening. Husband notes less tremor.  She does have some tremor in the legs.  Some trouble attending [facility] moves classes due to husbands appts at [facility].  Husband thinks improving and easier to get off of the floor.   She has had no sleep attacks.  She has had no compulsive behaviors.  Pt denies falls.  Pt denies lightheadedness, near syncope.  No hallucinations.  Mood has been fair but lexapro/cymbalta dosages just changed.  Pt is going back to retreat in May in [place] and sister is going with her.  Having more  pain in hands and feet.  Had positive ANA and 1:320 and going to see Dr. Marijean Bravo.  RF was positive.  Enrolled in life coach program for PD and patient likes it.  03/26/18 update: Patient is seen today in follow-up for Parkinson's disease.  She is on carbidopa/levodopa 25/100, 1 tablet 3 times per day and carbidopa/levodopa 50/200 at bedtime.  She is also on pramipexole 1 mg in the morning and 0.5 mg in the afternoon and evening.  She has had no falls since her last visit.  No lightheadedness or near syncope.  No sleep attacks.  No compulsive behaviors.  She is on both Lexapro and  Cymbalta for mood.  She has been working with primary care to manage this.  Records have been reviewed from primary care.  She saw Dr. Marijean Bravo since our last visit and was started on Plaquenil for rheumatoid arthritis.  She has an appt for her eye examination.  Besides this, she has had a stressful spring/summer.  Had back pain (better now) and water therapy helped.  Saw Dr Vertell Limber and told that didn't need surgery.  Friend had stroke.  08/15/18 update:  Pt seen today in f/u for PD.  She is on carbidopa/levodopa 25/100, which was increased last visit to 2/2/1 and carbidopa/levodopa 50/200 at bedtime.  She is on pramipexole, 1 mg in the AM and 0.5 mg in the afternoon and evening.  She is wondering if she has some compulsive tendencies.  She is moving things from 1 place to another.  She is cleaning things and "I'm not generally than clean of a person."  She states that this is taking up a lot of time.  She has been going to outpatient therapy.  After talking to her OT, she changed timing of her med some a few weeks ago and that helped.  The records that were made available to me were reviewed.  Pt reports that she feels "pretty good and better than I have in a while."    Current/Previously tried tremor medications: metoprololol  Current medications that may exacerbate tremor:  Cymbalta, although I reviewed that the patient did try to  change to Pristiq but the patient had nightmares with Pristiq and changed back to Cymbalta  Outside reports reviewed: historical medical records, lab reports and referral letter/letters.  Allergies  Allergen Reactions  . Bactrim [Sulfamethoxazole-Trimethoprim] Other (See Comments)    Oral ulcers and rash  . Penicillins Hives    Current Outpatient Medications on File Prior to Visit  Medication Sig Dispense Refill  . ALPRAZolam (XANAX) 0.25 MG tablet Take 0.5-1 tablets (0.125-0.25 mg total) by mouth 2 (two) times daily as needed for anxiety. 30 tablet 1  . B-D TB SYRINGE 1CC/27GX1/2" 27G X 1/2" 1 ML MISC     . carbidopa-levodopa (SINEMET CR) 50-200 MG tablet TAKE 1 TABLET BY MOUTH EVERYDAY AT BEDTIME 90 tablet 1  . carbidopa-levodopa (SINEMET IR) 25-100 MG tablet 2 in the morning, 2 in the afternoon, 1 in the evening 450 tablet 1  . DULoxetine (CYMBALTA) 30 MG capsule TAKE 1 CAPSULE BY MOUTH EVERY DAY 90 capsule 0  . escitalopram (LEXAPRO) 20 MG tablet Take 1 tablet (20 mg total) by mouth daily. 90 tablet 2  . folic acid (FOLVITE) 1 MG tablet     . Methotrexate Sodium (METHOTREXATE, PF,) 50 MG/2ML injection     . polyethylene glycol powder (GLYCOLAX/MIRALAX) powder Take 17 g by mouth 2 (two) times daily as needed (1 to 2 times per day, as needed). 850 g 0  . pramipexole (MIRAPEX) 0.5 MG tablet TAKE 2 TABLETS BY MOUTH IN THE MORNING AND 1 TABLET IN THE AFTERNOON AND 1 TABLET IN THE EVENING 360 tablet 1  . rosuvastatin (CRESTOR) 10 MG tablet TAKE 1 TABLET BY MOUTH ON MONDAYS, WEDNESDAYS AND FRIDAYS 54 tablet 1  . telmisartan (MICARDIS) 80 MG tablet TAKE 1 TABLET BY MOUTH EVERY DAY (Patient taking differently: 40 mg. ) 90 tablet 1   No current facility-administered medications on file prior to visit.     Past Medical History:  Diagnosis Date  . Abnormal vaginal Pap smear   . Acute pharyngitis 02/25/2013  .  Anemia   . Anxiety   . Arthritis   . BCC (basal cell carcinoma of skin) 10/05/2014     On back  . Chicken pox as a child  . Chronic UTI    sees dr Terance Hart  . Constipation 12/07/2015  . Depression with anxiety 11/02/2009   Qualifier: Diagnosis of  By: [name], LPN, [name]   . Dermatitis 07/17/2012  . Esophageal stricture 1994  . Fibroids   . Foot pain, bilateral 06/19/2012  . GERD (gastroesophageal reflux disease)   . GERD (gastroesophageal reflux disease)   . Hiatal hernia   . Hyperglycemia 08/19/2013  . 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 Hyperlipidemia   . Hypertension   . Infertility, female   . Low back pain 10/17/2007   Qualifier: Diagnosis of  By: [name], [name]   . Measles as a child  . Obesity   . Osteoarthritis   . Parkinson disease ([place])   . Plantar fasciitis of left foot 06/19/2012  . Preventative health care 12/19/2015  . Rheumatoid arthritis ([name]) 01/08/2018  . Rosacea 10/05/2014  . Swallowing difficulty   . Urinary frequency 02/25/2013  . Visual floaters 05/11/2014    Past Surgical History:  Procedure Laterality Date  . ABDOMINAL HYSTERECTOMY  2006   total  . esophageal     stretching  . laporoscopy    . PARTIAL HIP ARTHROPLASTY  2010   right  . TONSILLECTOMY    . wisdom teeth extracted      Social History   Socioeconomic History  . Marital status: Married    Spouse name: Not on file  . Number of children: Not on file  . Years of education: Not on file  . Highest education level: Not on file  Occupational History  . Occupation: Retired Teacher, music  Social Needs  . Financial resource strain: Not on file  . Food insecurity:    Worry: Not on file    Inability: Not on file  . Transportation needs:    Medical: Not on file    Non-medical: Not on file  Tobacco Use  . Smoking status: Never Smoker  . Smokeless tobacco: Never Used  Substance and Sexual Activity  . Alcohol use: Yes    Alcohol/week: 4.0 standard drinks    Types: 4 Glasses of wine per week    Comment: 2 glasses of wine weekly  . Drug use: No  . Sexual  activity: Not Currently    Partners: Male  Lifestyle  . Physical activity:    Days per week: Not on file    Minutes per session: Not on file  . Stress: Not on file  Relationships  . Social connections:    Talks on phone: Not on file    Gets together: Not on file    Attends religious service: Not on file    Active member of club or organization: Not on file    Attends meetings of clubs or organizations: Not on file    Relationship status: Not on file  . Intimate partner violence:    Fear of current or ex partner: Not on file    Emotionally abused: Not on file    Physically abused: Not on file    Forced sexual activity: Not on file  Other Topics Concern  . Not on file  Social History Narrative   Lives with husband, no major dietary restrictions, retired from teaching      Husband dx with cancer MDS June 2017.  Currently in remission. (11/03/16 pc)       Family Status  Relation Name Status  . Mother  Deceased at age 66       Dementia, breast cancer, bipolar  . Father  Deceased at age 29       heart failure, rheumatoid arthritis  . Sister Florian Buff       arthritis, PD  . Sister Keturah Shavers  . Daughter Maudie Mercury- adopted Alive       7  . Son Jenny Reichmann- adopted Alive       69  . MGM  Deceased  . MGF  Deceased  . PGM  Deceased  . PGF  Deceased  . Sister Peg Alive        healthy  . Sister Robina Ade       healthy  . Daughter maggie Alive       28/ healthy  . Neg Hx  (Not Specified)    Review of Systems A complete 10 system ROS was obtained and was negative apart from what is mentioned.   Objective:   VITALS:   Vitals:   08/15/18 1451  BP: 124/72  Pulse: 84  SpO2: 93%  Weight: 244 lb (110.7 kg)  Height: 5\' 4"  (1.626 m)   Wt Readings from Last 3 Encounters:  08/15/18 244 lb (110.7 kg)  05/10/18 251 lb 6.4 oz (114 kg)  04/02/18 250 lb (113.4 kg)    GEN:  The patient appears stated age and is in NAD. HEENT:  Normocephalic, atraumatic.  The mucous membranes are  moist. The superficial temporal arteries are without ropiness or tenderness. CV:  RRR Lungs:  CTAB Neck/HEME:  There are no carotid bruits bilaterally.  Neurological examination:  Orientation: The patient is alert and oriented x3. Cranial nerves: There is good facial symmetry. The speech is fluent and clear. Soft palate rises symmetrically and there is no tongue deviation. Hearing is intact to conversational tone. Sensation: Sensation is intact to light touch throughout Motor: Strength is 5/5 in the bilateral upper and lower extremities.   Shoulder shrug is equal and symmetric.  There is no pronator drift.   Movement examination: Tone: There is normal tone in the UE/LE Abnormal movements: There is no tremor noted today Coordination:  There is mild decremation with finger taps and heel taps on the right.   Gait and Station: The patient has no significant difficulty arising out of a deep-seated chair without the use of the hands. The patient's stride length is fairly normal but decreased arm swing on the right  LABS  Lab Results  Component Value Date   TSH 1.51 05/10/2018   Lab Results  Component Value Date   WBC 5.0 05/10/2018   HGB 12.0 05/10/2018   HCT 36.5 05/10/2018   MCV 86.5 05/10/2018   PLT 226.0 05/10/2018     Chemistry      Component Value Date/Time   NA 144 05/10/2018 0900   NA 141 11/27/2016 1123   K 4.5 05/10/2018 0900   CL 108 05/10/2018 0900   CO2 31 05/10/2018 0900   BUN 17 05/10/2018 0900   BUN 13 11/27/2016 1123   CREATININE 0.78 05/10/2018 0900   CREATININE 0.71 01/04/2015 1046      Component Value Date/Time   CALCIUM 9.2 05/10/2018 0900   ALKPHOS 72 05/10/2018 0900   AST 17 05/10/2018 0900   ALT 7 05/10/2018 0900   BILITOT 0.6 05/10/2018 0900   BILITOT 0.4 11/27/2016 1123  Lab Results  Component Value Date   WBC 5.0 05/10/2018   HGB 12.0 05/10/2018   HCT 36.5 05/10/2018   MCV 86.5 05/10/2018   PLT 226.0 05/10/2018   Lab Results    Component Value Date   IRON 73 01/08/2018   TIBC 402 03/05/2017   FERRITIN 22.3 01/08/2018        Assessment/Plan:   1. idiopathic Parkinson's disease.  The patient has tremor, bradykinesia, rigidity and mild postural instability.  -reduce pramipexole to 0.5 mg, 1 po tid.  She may be having some compulsive tendencies (cleaning, moving things around in the home).  -continue carbidopa/levodopa 25/100 2/2/1.  Looks better on this higher dosage  -She will continue Carbidopa/levodopa 50/200 at night.  This has definitely helped restless leg and has helped most of the nighttime cramping of the feet and legs. 2.  Depression and anxiety  - She is on a combination of Lexapro and Cymbalta  -She is now seeing a counselor 3.  Dizziness  -improved 4.  Dysphagia  -last MBE 12/2015.  Liquids becoming slightly more problematic.  Talked about chin tuck maneuver.  Use straw.   5.  Rheumatoid arthritis  -Seeing Dr. Marijean Bravo.  To start on MTX soon 6.  Follow up is anticipated in the next few months, sooner should new neurologic issues arise.  Much greater than 50% of this visit was spent in counseling and coordinating care.  Total face to face time:  25 min

## 2018-08-20 ENCOUNTER — Ambulatory Visit: Payer: Medicare Other | Admitting: Occupational Therapy

## 2018-08-20 ENCOUNTER — Encounter: Payer: Self-pay | Admitting: Occupational Therapy

## 2018-08-20 DIAGNOSIS — R278 Other lack of coordination: Secondary | ICD-10-CM

## 2018-08-20 DIAGNOSIS — R29818 Other symptoms and signs involving the nervous system: Secondary | ICD-10-CM | POA: Diagnosis not present

## 2018-08-20 DIAGNOSIS — R2689 Other abnormalities of gait and mobility: Secondary | ICD-10-CM

## 2018-08-20 DIAGNOSIS — M79642 Pain in left hand: Secondary | ICD-10-CM

## 2018-08-20 DIAGNOSIS — R293 Abnormal posture: Secondary | ICD-10-CM | POA: Diagnosis not present

## 2018-08-20 DIAGNOSIS — M79641 Pain in right hand: Secondary | ICD-10-CM

## 2018-08-20 DIAGNOSIS — R41840 Attention and concentration deficit: Secondary | ICD-10-CM | POA: Diagnosis not present

## 2018-08-20 DIAGNOSIS — R2681 Unsteadiness on feet: Secondary | ICD-10-CM

## 2018-08-20 DIAGNOSIS — R251 Tremor, unspecified: Secondary | ICD-10-CM | POA: Diagnosis not present

## 2018-08-20 DIAGNOSIS — R29898 Other symptoms and signs involving the musculoskeletal system: Secondary | ICD-10-CM | POA: Diagnosis not present

## 2018-08-20 NOTE — Therapy (Signed)
[facility] [address] [place], [place], [address] Phone: [phone]   Fax:  [phone]  Occupational Therapy Treatment  Patient Details  Name: Kristin [name] MRN: [id number] Date of Birth: 23-May-1951 Referring Provider (OT): Dr. [name] Tat   Encounter Date: 08/20/2018  OT End of Session - 08/20/18 1505    Visit Number  6    Number of Visits  17    Date for OT Re-Evaluation  09/27/18    Authorization Type  Medicare / [employer]. date 07/29/18-10/27/18    Authorization - Visit Number  6    Authorization - Number of Visits  10    OT Start Time  0932    OT Stop Time  1452    OT Time Calculation (min)  47 min    Activity Tolerance  Patient tolerated treatment well    Behavior During Therapy  WFL for tasks assessed/performed       Past Medical History:  Diagnosis Date  . Abnormal vaginal Pap smear   . Acute pharyngitis 02/25/2013  . Anemia   . Anxiety   . Arthritis   . 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 Chicken pox as a child  . Chronic UTI    sees dr Terance Hart  . Constipation 12/07/2015  . Depression with anxiety 11/02/2009   Qualifier: Diagnosis of  By: [name], LPN, [name]   . Dermatitis 07/17/2012  . Esophageal stricture 1994  . Fibroids   . Foot pain, bilateral 06/19/2012  . GERD (gastroesophageal reflux disease)   . GERD (gastroesophageal reflux disease)   . Hiatal hernia   . Hyperglycemia 08/19/2013  . 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 Hyperlipidemia   . Hypertension   . Infertility, female   . Low back pain 10/17/2007   Qualifier: Diagnosis of  By: [name], [name]   . Measles as a child  . Obesity   . Osteoarthritis   . Parkinson disease ([place])   . Plantar fasciitis of left foot 06/19/2012  . Preventative health care 12/19/2015  . Rheumatoid arthritis ([place]) 01/08/2018  . Rosacea 10/05/2014  . Swallowing difficulty   . Urinary  frequency 02/25/2013  . Visual floaters 05/11/2014    Past Surgical History:  Procedure Laterality Date  . ABDOMINAL HYSTERECTOMY  2006   total  . esophageal     stretching  . laporoscopy    . PARTIAL HIP ARTHROPLASTY  2010   right  . TONSILLECTOMY    . wisdom teeth extracted      There were no vitals filed for this visit.  Subjective Assessment - 08/20/18 1458    Subjective   Pt reports that Dr. Carles Collet reduced her Mirapex, been feeling good, no pain in hands, pt reports that new med schedule and exercising in am helps, pt reports that she was able to write all her Christmas cards, and that she is loading/unloading dishwasher better.    Pertinent History  Parkinson's disease; RA diagnosis (spring 2019), depression, anxiety, HTN, OA, GERD, prediabetes, vitamin D deficiency, basal cell carcinoma of skin, hyperhydrosis, hyperlipidemia    Limitations  RA with hand and foot pain    Patient Stated Goals  improve writing, ability to open things, doing eye make-up, folding laundry, typing, unloading dishwasher, using screwdiver, donning shoes/socks    Currently in Pain?  No/denies        Discussed progress with use  of ADL/IADL strategies.  Recommended pt sitting with feet apart/supported and hands flat on lap and take a deep breath prior to activities where she gets "nervous" as it incr tremors.  Practiced threading small washers/tubes on fishing line to simulate threading needle.  Then making knots.  Pt with good success.  Recommended pt use PWR! Hands between repetition with fine motor tasks, support forearms/elbows on table or stabilize against body prn with fine motor activities/doing make-up. Recommended use of coban around small screwdriver and placing object supported on table with shelf liner under it.  (also wrap make-up/eye pencils with coban).  Pt brought in electronic toy for grandkids and screwdriver to change batteries, but screwdriver was wrong size.  Recommended moving with big  movements, but not necessarily squeezing hard on small objects (screwdriver, pen, etc and that building up grip may help with this).  Practiced folding towels, pillowcases, and sheets.  Discussed how to incorporate large amplitude movements to incr ease.  Fold on supported surface.  Get help for large king sized sheets for initial 2 folds.  Keep feet apart to allow for better balance and bigger arm movements.  Pt returned demo with good success and incr movement amplitude.    Practiced picking up pills with large amplitude movements and working to abduct fingers so that 4-5th digits don't feel "stuck together."     OT Short Term Goals - 07/30/18 1239      OT SHORT TERM GOAL #1   Title  Pt will be independent with updated HEP.--check STGs 08/27/18    Time  4    Period  Weeks    Status  New      OT SHORT TERM GOAL #2   Title  Pt will report unloading dishwasher as only "minimally difficult" (2)    Baseline  somewhat difficult (3)    Time  4    Period  Weeks    Status  New      OT SHORT TERM GOAL #3   Title  Pt will perform PPT#4 in less than 20 sec for incr ease with dressing.    Baseline  23.38sec    Time  4    Period  Weeks    Status  New      OT SHORT TERM GOAL #4   Title  Pt will report opening containers/boxes as only "somewhat difficult" (3)    Baseline  mod difficult (4)    Time  4    Period  Weeks    Status  New      OT SHORT TERM GOAL #5   Title  Pt will report folding laundry as "minimally difficult" (2)    Baseline  somewhat difficult (3)    Time  4    Period  Weeks    Status  New        OT Long Term Goals - 07/30/18 1242      OT LONG TERM GOAL #1   Title  Pt will verbalize understanding of adaptive stratgies to incr. ease/efficiency and decr pain with ADLs/IADLs--check LTGs 09/27/17    Time  8    Period  Weeks    Status  New      OT LONG TERM GOAL #2   Title  Pt will report typing on computer as "somewhat difficult" (3)    Baseline  mod difficult (4)     Time  8    Period  Weeks    Status  New  OT LONG TERM GOAL #3   Title  Pt will improve functional reaching/coordination for ADLs as shown by improving score on box and blocks test by at least 3blocks with RUE.    Baseline  R-54 blocks    Time  8    Period  Weeks    Status  New      OT LONG TERM GOAL #4   Title  Pt will report donning R shoe/sock as only "somewhat difficult" (3).    Baseline  rated as very difficult (5) on Functional tasks recording form    Period  Weeks    Status  New      OT LONG TERM GOAL #5   Title  Pt will be able to write 4-5 sentences with 100% legibility and rate as "somewhat difficult".    Baseline  very difficult, 90%    Time  8    Period  Weeks    Status  New      OT LONG TERM GOAL #6   Title  Pt will report unscrewing small screw as "somewhat difficult (3)."    Baseline  very difficult (5)    Time  8    Period  Weeks    Status  New            Plan - 08/20/18 1505    Clinical Impression Statement  Pt is progressing towards goals.  Pt is implementing adaptive strategies for ADLs as discussed and reports incr ease with use of strategies.      Occupational Profile and client history currently impacting functional performance  Pt is independent with ADLs/IADLs, but now reports pain in hands affecting ADLs and incr difficulty for ADLs/IADLs and ability to participate in social/leisure activities as she previously has.    Occupational performance deficits (Please refer to evaluation for details):  ADL's;IADL's;Leisure;Social Participation    Rehab Potential  Good    OT Frequency  2x / week    OT Duration  8 weeks   +eval (or 16 visits)   OT Treatment/Interventions  Self-care/ADL training;Therapeutic exercise;Moist Heat;Paraffin;Neuromuscular education;Splinting;Patient/family education;Therapeutic activities;Functional Mobility Training;Energy conservation;Fluidtherapy;Cryotherapy;Ultrasound;Contrast Bath;DME and/or AE instruction;Manual  Therapy;Passive range of motion;Cognitive remediation/compensation;Balance training    Plan  strategies for ADLs/IADLs; typing, opening boxes, donning/doffing jacket    Clinical Decision Making  Several treatment options, min-mod task modification necessary    Recommended Other Services  recent d/c from PT    Consulted and Agree with Plan of Care  Patient       Patient will benefit from skilled therapeutic intervention in order to improve the following deficits and impairments:  Decreased cognition, Decreased knowledge of use of DME, Pain, Decreased mobility, Decreased coordination, Decreased activity tolerance, Decreased range of motion, Impaired tone, Improper spinal/pelvic alignment, Impaired UE functional use, Decreased knowledge of precautions, Decreased balance(bradykinesia)  Visit Diagnosis: Other symptoms and signs involving the nervous system  Other symptoms and signs involving the musculoskeletal system  Tremor  Pain in left hand  Pain in right hand  Other lack of coordination  Abnormal posture  Attention and concentration deficit  Other abnormalities of gait and mobility  Unsteadiness on feet    Problem List Patient Active Problem List   Diagnosis Date Noted  . Cough 05/10/2018  . Rheumatoid arthritis ([place]) 01/08/2018  . Hand pain, right 10/15/2017  . Vitamin D deficiency 12/20/2016  . Prediabetes 12/20/2016  . Shortness of breath on exertion 11/27/2016  . Preventative health care 12/19/2015  . Constipation 12/07/2015  . BCC (  basal cell carcinoma of skin) 10/05/2014  . Rosacea 10/05/2014  . Parkinson's disease ([place]) 05/11/2014  . Visual floaters 05/11/2014  . Paralysis agitans ([place]) 01/08/2014  . Screening for cervical cancer 07/17/2012  . Foot pain, bilateral 06/19/2012  . 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 Obesity 11/12/2009  . Depression with anxiety 11/02/2009  . 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 Lumbar back pain with  radiculopathy affecting lower extremity 10/17/2007  . Essential hypertension 07/05/2007  . Osteoarthritis 07/05/2007  . Hyperlipidemia 06/26/2007  . H/O: iron deficiency anemia 05/08/2007  . GERD 05/08/2007    [facility] 08/20/2018, 3:07 PM  [facility] [address] [place], [place], [address] Phone: [phone]   Fax:  [phone]  Name: Kristin [name] MRN: [id number] Date of Birth: Oct 07, 1950   Vianne Bulls, OTR/L [facility] [address] [place], [place]  [address] [phone] phone [phone] 08/20/18 3:07 PM

## 2018-08-22 ENCOUNTER — Ambulatory Visit: Payer: Medicare Other | Admitting: Occupational Therapy

## 2018-08-22 DIAGNOSIS — R293 Abnormal posture: Secondary | ICD-10-CM

## 2018-08-22 DIAGNOSIS — R29898 Other symptoms and signs involving the musculoskeletal system: Secondary | ICD-10-CM | POA: Diagnosis not present

## 2018-08-22 DIAGNOSIS — R29818 Other symptoms and signs involving the nervous system: Secondary | ICD-10-CM | POA: Diagnosis not present

## 2018-08-22 DIAGNOSIS — R2681 Unsteadiness on feet: Secondary | ICD-10-CM

## 2018-08-22 DIAGNOSIS — M79642 Pain in left hand: Secondary | ICD-10-CM

## 2018-08-22 DIAGNOSIS — R2689 Other abnormalities of gait and mobility: Secondary | ICD-10-CM

## 2018-08-22 DIAGNOSIS — M79641 Pain in right hand: Secondary | ICD-10-CM

## 2018-08-22 DIAGNOSIS — R278 Other lack of coordination: Secondary | ICD-10-CM | POA: Diagnosis not present

## 2018-08-22 DIAGNOSIS — R41840 Attention and concentration deficit: Secondary | ICD-10-CM | POA: Diagnosis not present

## 2018-08-22 DIAGNOSIS — R251 Tremor, unspecified: Secondary | ICD-10-CM

## 2018-08-22 DIAGNOSIS — F331 Major depressive disorder, recurrent, moderate: Secondary | ICD-10-CM | POA: Diagnosis not present

## 2018-08-22 NOTE — Patient Instructions (Signed)
  Ways to make PWR! Moves Harder:  1.  Counting  2. Flow   3.  Performing on foam   4.  Performing while saying alphabet backwards, colors, categories (vegetables, cities/foods by letter, animals, etc).  5.  Step over obstacles, reach to targets

## 2018-08-22 NOTE — Therapy (Signed)
[facility] [address] [place], [place], [address] Phone: [phone]   Fax:  [phone]  Occupational Therapy Treatment  Patient Details  Name: Kristin [name] MRN: [id number] Date of Birth: December 21, 1950 Referring Provider (OT): Dr. [name] Tat   Encounter Date: 08/22/2018  OT End of Session - 08/22/18 0947    Visit Number  7    Number of Visits  17    Date for OT Re-Evaluation  09/27/18    Authorization Type  Medicare / [employer]. date 07/29/18-10/27/18    Authorization - Visit Number  7    Authorization - Number of Visits  10    OT Start Time  [phone]    OT Stop Time  [phone]    OT Time Calculation (min)  43 min    Activity Tolerance  Patient tolerated treatment well    Behavior During Therapy  [facility] for tasks assessed/performed       Past Medical History:  Diagnosis Date  . Abnormal vaginal Pap smear   . Acute pharyngitis 02/25/2013  . Anemia   . Anxiety   . Arthritis   . 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 Chicken pox as a child  . Chronic UTI    sees dr Terance Hart  . Constipation 12/07/2015  . Depression with anxiety 11/02/2009   Qualifier: Diagnosis of  By: [name], LPN, [name]   . Dermatitis 07/17/2012  . Esophageal stricture 1994  . Fibroids   . Foot pain, bilateral 06/19/2012  . GERD (gastroesophageal reflux disease)   . GERD (gastroesophageal reflux disease)   . Hiatal hernia   . Hyperglycemia 08/19/2013  . 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 Hyperlipidemia   . Hypertension   . Infertility, female   . Low back pain 10/17/2007   Qualifier: Diagnosis of  By: [name], [name]   . Measles as a child  . Obesity   . Osteoarthritis   . Parkinson disease ([place])   . Plantar fasciitis of left foot 06/19/2012  . Preventative health care 12/19/2015  . Rheumatoid arthritis ([place]) 01/08/2018  . Rosacea 10/05/2014  . Swallowing difficulty   . Urinary  frequency 02/25/2013  . Visual floaters 05/11/2014    Past Surgical History:  Procedure Laterality Date  . ABDOMINAL HYSTERECTOMY  2006   total  . esophageal     stretching  . laporoscopy    . PARTIAL HIP ARTHROPLASTY  2010   right  . TONSILLECTOMY    . wisdom teeth extracted      There were no vitals filed for this visit.  Subjective Assessment - 08/22/18 0947    Subjective   Been doing well.  I'm more aware of keeping my feet apart.  I wrapped presents and it went well, but my hands hurt later.    Pertinent History  Parkinson's disease; RA diagnosis (spring 2019), depression, anxiety, HTN, OA, GERD, prediabetes, vitamin D deficiency, basal cell carcinoma of skin, hyperhydrosis, hyperlipidemia    Limitations  RA with hand and foot pain    Patient Stated Goals  improve writing, ability to open things, doing eye make-up, folding laundry, typing, unloading dishwasher, using screwdiver, donning shoes/socks    Currently in Pain?  No/denies         Opening boxes/packages:  Practiced with min cueing for use of PWR! Hands, to use slow but deliberate movements, and to use large amplitude.  Pt with incr ease with use of strategies after education provided.  Donning/doffing jacket in sitting:  Pt educated in strategies including scooting forward, using trunk rotation.  Pt able to return demo x4 with good success.  Discussed progress and anticipated difficulties with upcoming events/activities.  Reviewed strategies for ADLs/IADLs from previous sessions.    Discussed environmental factors that may incr risk for PD per pt questions and answered questions as able.  Pt asks about exercise equipment that husband saw on tv.  Discussed how PD HEP addresses functional difficulty and recommended against it.  Pt verbalized understanding.     OT Education - 08/22/18 [phone]    Education Details  Reviewed ways to incr difficulty with HEP/PWR! moves--see pt instructions    Person(s) Educated  Patient     Methods  Explanation;Verbal cues    Comprehension  Verbalized understanding       OT Short Term Goals - 07/30/18 1239      OT SHORT TERM GOAL #1   Title  Pt will be independent with updated HEP.--check STGs 08/27/18    Time  4    Period  Weeks    Status  New      OT SHORT TERM GOAL #2   Title  Pt will report unloading dishwasher as only "minimally difficult" (2)    Baseline  somewhat difficult (3)    Time  4    Period  Weeks    Status  New      OT SHORT TERM GOAL #3   Title  Pt will perform PPT#4 in less than 20 sec for incr ease with dressing.    Baseline  23.38sec    Time  4    Period  Weeks    Status  New      OT SHORT TERM GOAL #4   Title  Pt will report opening containers/boxes as only "somewhat difficult" (3)    Baseline  mod difficult (4)    Time  4    Period  Weeks    Status  New      OT SHORT TERM GOAL #5   Title  Pt will report folding laundry as "minimally difficult" (2)    Baseline  somewhat difficult (3)    Time  4    Period  Weeks    Status  New        OT Long Term Goals - 07/30/18 1242      OT LONG TERM GOAL #1   Title  Pt will verbalize understanding of adaptive stratgies to incr. ease/efficiency and decr pain with ADLs/IADLs--check LTGs 09/27/17    Time  8    Period  Weeks    Status  New      OT LONG TERM GOAL #2   Title  Pt will report typing on computer as "somewhat difficult" (3)    Baseline  mod difficult (4)    Time  8    Period  Weeks    Status  New      OT LONG TERM GOAL #3   Title  Pt will improve functional reaching/coordination for ADLs as shown by improving score on box and blocks test by at least 3blocks with RUE.    Baseline  R-54 blocks    Time  8    Period  Weeks    Status  New      OT LONG TERM GOAL #4   Title  Pt will report donning R shoe/sock as only "  somewhat difficult" (3).    Baseline  rated as very difficult (5) on Functional tasks recording form    Period  Weeks    Status  New      OT LONG TERM GOAL #5    Title  Pt will be able to write 4-5 sentences with 100% legibility and rate as "somewhat difficult".    Baseline  very difficult, 90%    Time  8    Period  Weeks    Status  New      OT LONG TERM GOAL #6   Title  Pt will report unscrewing small screw as "somewhat difficult (3)."    Baseline  very difficult (5)    Time  8    Period  Weeks    Status  New            Plan - 08/22/18 0946    Clinical Impression Statement  Pt is progressing towards goals.  Pt is implementing adaptive strategies for ADLs as discussed and reports incr ease with use of strategies.  Pt reports that she is more aware of how she is moving.    Occupational Profile and client history currently impacting functional performance  Pt is independent with ADLs/IADLs, but now reports pain in hands affecting ADLs and incr difficulty for ADLs/IADLs and ability to participate in social/leisure activities as she previously has.    Occupational performance deficits (Please refer to evaluation for details):  ADL's;IADL's;Leisure;Social Participation    Rehab Potential  Good    OT Frequency  2x / week    OT Duration  8 weeks   +eval (or 16 visits)   OT Treatment/Interventions  Self-care/ADL training;Therapeutic exercise;Moist Heat;Paraffin;Neuromuscular education;Splinting;Patient/family education;Therapeutic activities;Functional Mobility Training;Energy conservation;Fluidtherapy;Cryotherapy;Ultrasound;Contrast Bath;DME and/or AE instruction;Manual Therapy;Passive range of motion;Cognitive remediation/compensation;Balance training    Plan  strategies for ADLs/IADLs; typing, opening boxes/packages    Clinical Decision Making  Several treatment options, min-mod task modification necessary    Recommended Other Services  recent d/c from PT    Consulted and Agree with Plan of Care  Patient       Patient will benefit from skilled therapeutic intervention in order to improve the following deficits and impairments:  Decreased  cognition, Decreased knowledge of use of DME, Pain, Decreased mobility, Decreased coordination, Decreased activity tolerance, Decreased range of motion, Impaired tone, Improper spinal/pelvic alignment, Impaired UE functional use, Decreased knowledge of precautions, Decreased balance(bradykinesia)  Visit Diagnosis: Other symptoms and signs involving the nervous system  Other symptoms and signs involving the musculoskeletal system  Tremor  Pain in left hand  Pain in right hand  Other lack of coordination  Abnormal posture  Attention and concentration deficit  Other abnormalities of gait and mobility  Unsteadiness on feet    Problem List Patient Active Problem List   Diagnosis Date Noted  . Cough 05/10/2018  . Rheumatoid arthritis ([place]) 01/08/2018  . Hand pain, right 10/15/2017  . Vitamin D deficiency 12/20/2016  . Prediabetes 12/20/2016  . Shortness of breath on exertion 11/27/2016  . Preventative health care 12/19/2015  . Constipation 12/07/2015  . BCC (basal cell carcinoma of skin) 10/05/2014  . Rosacea 10/05/2014  . Parkinson's disease ([place]) 05/11/2014  . Visual floaters 05/11/2014  . Paralysis agitans ([place]) 01/08/2014  . Screening for cervical cancer 07/17/2012  . Foot pain, bilateral 06/19/2012  . 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 Obesity 11/12/2009  . Depression with anxiety 11/02/2009  . 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 Lumbar back pain with radiculopathy affecting  lower extremity 10/17/2007  . Essential hypertension 07/05/2007  . Osteoarthritis 07/05/2007  . Hyperlipidemia 06/26/2007  . H/O: iron deficiency anemia 05/08/2007  . GERD 05/08/2007    [facility] 08/22/2018, 9:54 AM  [facility] [address] [place], [place], [address] Phone: [phone]   Fax:  [phone]  Name: ANABETH CHILCOTT MRN: [id number] Date of Birth: Nov 28, 1950   Vianne Bulls, OTR/L [facility] [address] [place], [place]  [address] [phone] phone [phone] 08/22/18 9:54 AM

## 2018-08-24 ENCOUNTER — Other Ambulatory Visit: Payer: Self-pay | Admitting: Family Medicine

## 2018-08-26 NOTE — Telephone Encounter (Signed)
Requesting:xanax Contract:yes UDS:low risk next screen 07/11/18 Last OV:05/10/18 Next OV:09/13/18 Last Refill:01/08/18  #[id number] Database:   Please advise

## 2018-08-30 ENCOUNTER — Encounter: Payer: Self-pay | Admitting: Occupational Therapy

## 2018-08-30 ENCOUNTER — Ambulatory Visit: Payer: Medicare Other | Admitting: Occupational Therapy

## 2018-08-30 DIAGNOSIS — R293 Abnormal posture: Secondary | ICD-10-CM

## 2018-08-30 DIAGNOSIS — R29898 Other symptoms and signs involving the musculoskeletal system: Secondary | ICD-10-CM | POA: Diagnosis not present

## 2018-08-30 DIAGNOSIS — M6281 Muscle weakness (generalized): Secondary | ICD-10-CM

## 2018-08-30 DIAGNOSIS — R41840 Attention and concentration deficit: Secondary | ICD-10-CM

## 2018-08-30 DIAGNOSIS — R251 Tremor, unspecified: Secondary | ICD-10-CM | POA: Diagnosis not present

## 2018-08-30 DIAGNOSIS — R29818 Other symptoms and signs involving the nervous system: Secondary | ICD-10-CM

## 2018-08-30 DIAGNOSIS — R2689 Other abnormalities of gait and mobility: Secondary | ICD-10-CM

## 2018-08-30 DIAGNOSIS — R278 Other lack of coordination: Secondary | ICD-10-CM

## 2018-08-30 DIAGNOSIS — M79641 Pain in right hand: Secondary | ICD-10-CM

## 2018-08-30 DIAGNOSIS — R2681 Unsteadiness on feet: Secondary | ICD-10-CM

## 2018-08-30 DIAGNOSIS — M79642 Pain in left hand: Secondary | ICD-10-CM

## 2018-08-30 NOTE — Therapy (Signed)
[facility] [address] [place], [place], [address] Phone: [phone]   Fax:  [phone]  Occupational Therapy Treatment  Patient Details  Name: Kristin [name] MRN: [id number] Date of Birth: 03-Apr-1951 Referring Provider (OT): Dr. [name] Tat   Encounter Date: 08/30/2018  OT End of Session - 08/30/18 0834    Visit Number  8    Number of Visits  17    Date for OT Re-Evaluation  09/27/18    Authorization Type  Medicare / [employer] [place]. date 07/29/18-10/27/18    Authorization - Visit Number  8    Authorization - Number of Visits  10    OT Start Time  [phone]    OT Stop Time  0915    OT Time Calculation (min)  42 min    Activity Tolerance  Patient tolerated treatment well    Behavior During Therapy  [facility] for tasks assessed/performed       Past Medical History:  Diagnosis Date  . Abnormal vaginal Pap smear   . Acute pharyngitis 02/25/2013  . Anemia   . Anxiety   . Arthritis   . 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 Chicken pox as a child  . Chronic UTI    sees dr Terance Hart  . Constipation 12/07/2015  . Depression with anxiety 11/02/2009   Qualifier: Diagnosis of  By: [name], LPN, [name]   . Dermatitis 07/17/2012  . Esophageal stricture 1994  . Fibroids   . Foot pain, bilateral 06/19/2012  . GERD (gastroesophageal reflux disease)   . GERD (gastroesophageal reflux disease)   . Hiatal hernia   . Hyperglycemia 08/19/2013  . 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 Hyperlipidemia   . Hypertension   . Infertility, female   . Low back pain 10/17/2007   Qualifier: Diagnosis of  By: [name], [name]   . Measles as a child  . Obesity   . Osteoarthritis   . Parkinson disease ([place])   . Plantar fasciitis of left foot 06/19/2012  . Preventative health care 12/19/2015  . Rheumatoid arthritis ([place]) 01/08/2018  . Rosacea 10/05/2014  . Swallowing difficulty   . Urinary  frequency 02/25/2013  . Visual floaters 05/11/2014    Past Surgical History:  Procedure Laterality Date  . ABDOMINAL HYSTERECTOMY  2006   total  . esophageal     stretching  . laporoscopy    . PARTIAL HIP ARTHROPLASTY  2010   right  . TONSILLECTOMY    . wisdom teeth extracted      There were no vitals filed for this visit.  Subjective Assessment - 08/30/18 0834    Subjective   I did better with the kids.  Pt reports that she feels good about things this round of therapy.    Pertinent History  Parkinson's disease; RA diagnosis (spring 2019), depression, anxiety, HTN, OA, GERD, prediabetes, vitamin D deficiency, basal cell carcinoma of skin, hyperhydrosis, hyperlipidemia    Limitations  RA with hand and foot pain    Patient Stated Goals  improve writing, ability to open things, doing eye make-up, folding laundry, typing, unloading dishwasher, using screwdiver, donning shoes/socks    Currently in Pain?  No/denies         Typing test:  44 Wpm, 71% accuracy, 31 wpm net speed.  Then 49 wpm, 90% accuracy, 44 wpm net speed.  Pt with incr difficulty with R hand  and keys using 4-5th digits, particularly with further reaching.  Placing [place]nor pegs in pegboard  With each hand with min difficulty/cueing for incr coordination.  Discussed progress and continued difficulties.  Pt demo ability to doff jacket while sitting in chair now with incr ease.  Pt reports continued difficulty with small screws.  Pt reports use of adaptive strategies for playing with grandchildren.  Pt reports that hands feel better.  Discussed use of scarves (tossing) and playing with grandchildren with them to help with coordination/large hand movements.  Picking up coins using each finger/thumb with each hand for incr coordination/individual finger movements in prep for typing with good accuracy, min difficulty initially.    Discussed impact of movement amplitude/power to help pick up small objects and recommended pt  expect that novel tasks take incr time but that it is good to challenge herself.  Pt reports that she is beginning to make knotted blankets.     OT Short Term Goals - 08/30/18 0839      OT SHORT TERM GOAL #1   Title  Pt will be independent with updated HEP.--check STGs 08/27/18    Time  4    Period  Weeks    Status  Achieved   08/30/18     OT SHORT TERM GOAL #2   Title  Pt will report unloading dishwasher as only "minimally difficult" (2)    Baseline  somewhat difficult (3)    Time  4    Period  Weeks    Status  New      OT SHORT TERM GOAL #3   Title  Pt will perform PPT#4 in less than 20 sec for incr ease with dressing.    Baseline  23.38sec    Time  4    Period  Weeks    Status  New      OT SHORT TERM GOAL #4   Title  Pt will report opening containers/boxes as only "somewhat difficult" (3)    Baseline  mod difficult (4)    Time  4    Period  Weeks    Status  New      OT SHORT TERM GOAL #5   Title  Pt will report folding laundry as "minimally difficult" (2)    Baseline  somewhat difficult (3)    Time  4    Period  Weeks    Status  New        OT Long Term Goals - 07/30/18 1242      OT LONG TERM GOAL #1   Title  Pt will verbalize understanding of adaptive stratgies to incr. ease/efficiency and decr pain with ADLs/IADLs--check LTGs 09/27/17    Time  8    Period  Weeks    Status  New      OT LONG TERM GOAL #2   Title  Pt will report typing on computer as "somewhat difficult" (3)    Baseline  mod difficult (4)    Time  8    Period  Weeks    Status  New      OT LONG TERM GOAL #3   Title  Pt will improve functional reaching/coordination for ADLs as shown by improving score on box and blocks test by at least 3blocks with RUE.    Baseline  R-54 blocks    Time  8    Period  Weeks    Status  New      OT LONG TERM GOAL #4  Title  Pt will report donning R shoe/sock as only "somewhat difficult" (3).    Baseline  rated as very difficult (5) on Functional tasks  recording form    Period  Weeks    Status  New      OT LONG TERM GOAL #5   Title  Pt will be able to write 4-5 sentences with 100% legibility and rate as "somewhat difficult".    Baseline  very difficult, 90%    Time  8    Period  Weeks    Status  New      OT LONG TERM GOAL #6   Title  Pt will report unscrewing small screw as "somewhat difficult (3)."    Baseline  very difficult (5)    Time  8    Period  Weeks    Status  New            Plan - 08/30/18 3220    Clinical Impression Statement  Pt is progressing towards goals.  Pt demo improved coordination and incr ease with functional tasks.      Occupational Profile and client history currently impacting functional performance  Pt is independent with ADLs/IADLs, but now reports pain in hands affecting ADLs and incr difficulty for ADLs/IADLs and ability to participate in social/leisure activities as she previously has.    Occupational performance deficits (Please refer to evaluation for details):  ADL's;IADL's;Leisure;Social Participation    Rehab Potential  Good    OT Frequency  2x / week    OT Duration  8 weeks   +eval (or 16 visits)   OT Treatment/Interventions  Self-care/ADL training;Therapeutic exercise;Moist Heat;Paraffin;Neuromuscular education;Splinting;Patient/family education;Therapeutic activities;Functional Mobility Training;Energy conservation;Fluidtherapy;Cryotherapy;Ultrasound;Contrast Bath;DME and/or AE instruction;Manual Therapy;Passive range of motion;Cognitive remediation/compensation;Balance training    Plan  check goals, possible d/c, ?velcro on home keys    Clinical Decision Making  Several treatment options, min-mod task modification necessary    Recommended Other Services  recent d/c from PT    Consulted and Agree with Plan of Care  Patient       Patient will benefit from skilled therapeutic intervention in order to improve the following deficits and impairments:  Decreased cognition, Decreased knowledge  of use of DME, Pain, Decreased mobility, Decreased coordination, Decreased activity tolerance, Decreased range of motion, Impaired tone, Improper spinal/pelvic alignment, Impaired UE functional use, Decreased knowledge of precautions, Decreased balance(bradykinesia)  Visit Diagnosis: Other symptoms and signs involving the nervous system  Other symptoms and signs involving the musculoskeletal system  Tremor  Pain in left hand  Pain in right hand  Other lack of coordination  Abnormal posture  Attention and concentration deficit  Other abnormalities of gait and mobility  Unsteadiness on feet  Muscle weakness (generalized)    Problem List Patient Active Problem List   Diagnosis Date Noted  . Cough 05/10/2018  . Rheumatoid arthritis ([place]) 01/08/2018  . Hand pain, right 10/15/2017  . Vitamin D deficiency 12/20/2016  . Prediabetes 12/20/2016  . Shortness of breath on exertion 11/27/2016  . Preventative health care 12/19/2015  . Constipation 12/07/2015  . BCC (basal cell carcinoma of skin) 10/05/2014  . Rosacea 10/05/2014  . Parkinson's disease ([place]) 05/11/2014  . Visual floaters 05/11/2014  . Paralysis agitans ([place]) 01/08/2014  . Screening for cervical cancer 07/17/2012  . Foot pain, bilateral 06/19/2012  . 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 Obesity 11/12/2009  . Depression with anxiety 11/02/2009  . 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 Lumbar back pain with radiculopathy affecting lower extremity  10/17/2007  . Essential hypertension 07/05/2007  . Osteoarthritis 07/05/2007  . Hyperlipidemia 06/26/2007  . H/O: iron deficiency anemia 05/08/2007  . GERD 05/08/2007    [facility] 08/30/2018, 2:33 PM  [facility] [address] [place], [place], [address] Phone: [phone]   Fax:  [phone]  Name: HENRIETTA CIESLEWICZ MRN: [id number] Date of Birth: [birth year]   Vianne Bulls, OTR/L [facility] [address] [place], [place]  [address] [phone] phone [phone] 08/30/18 2:33 PM

## 2018-09-02 ENCOUNTER — Encounter: Payer: Self-pay | Admitting: Occupational Therapy

## 2018-09-02 ENCOUNTER — Ambulatory Visit: Payer: Medicare Other | Admitting: Occupational Therapy

## 2018-09-02 DIAGNOSIS — R251 Tremor, unspecified: Secondary | ICD-10-CM | POA: Diagnosis not present

## 2018-09-02 DIAGNOSIS — R2689 Other abnormalities of gait and mobility: Secondary | ICD-10-CM

## 2018-09-02 DIAGNOSIS — R278 Other lack of coordination: Secondary | ICD-10-CM | POA: Diagnosis not present

## 2018-09-02 DIAGNOSIS — R293 Abnormal posture: Secondary | ICD-10-CM | POA: Diagnosis not present

## 2018-09-02 DIAGNOSIS — R29898 Other symptoms and signs involving the musculoskeletal system: Secondary | ICD-10-CM | POA: Diagnosis not present

## 2018-09-02 DIAGNOSIS — R29818 Other symptoms and signs involving the nervous system: Secondary | ICD-10-CM | POA: Diagnosis not present

## 2018-09-02 DIAGNOSIS — M79641 Pain in right hand: Secondary | ICD-10-CM

## 2018-09-02 DIAGNOSIS — M79642 Pain in left hand: Secondary | ICD-10-CM

## 2018-09-02 DIAGNOSIS — R41840 Attention and concentration deficit: Secondary | ICD-10-CM

## 2018-09-02 DIAGNOSIS — R2681 Unsteadiness on feet: Secondary | ICD-10-CM

## 2018-09-02 NOTE — Therapy (Signed)
[facility] [address] [place], [place], [address] Phone: [phone]   Fax:  [phone]  Occupational Therapy Treatment  Patient Details  Name: Kristin [name] MRN: [id number] Date of Birth: 1950-09-13 Referring Provider (OT): Dr. [name] Tat   Encounter Date: 09/02/2018  OT End of Session - 09/02/18 1105    Visit Number  9    Number of Visits  17    Date for OT Re-Evaluation  09/27/18    Authorization Type  Medicare / [employer] [place]. date 07/29/18-10/27/18    Authorization - Visit Number  9    Authorization - Number of Visits  10    OT Start Time  1103    OT Stop Time  1145    OT Time Calculation (min)  42 min    Activity Tolerance  Patient tolerated treatment well    Behavior During Therapy  [facility] for tasks assessed/performed       Past Medical History:  Diagnosis Date  . Abnormal vaginal Pap smear   . Acute pharyngitis 02/25/2013  . Anemia   . Anxiety   . Arthritis   . 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 Chicken pox as a child  . Chronic UTI    sees dr Terance Hart  . Constipation 12/07/2015  . Depression with anxiety 11/02/2009   Qualifier: Diagnosis of  By: [name], LPN, [name]   . Dermatitis 07/17/2012  . Esophageal stricture 1994  . Fibroids   . Foot pain, bilateral 06/19/2012  . GERD (gastroesophageal reflux disease)   . GERD (gastroesophageal reflux disease)   . Hiatal hernia   . Hyperglycemia 08/19/2013  . 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 Hyperlipidemia   . Hypertension   . Infertility, female   . Low back pain 10/17/2007   Qualifier: Diagnosis of  By: [name], [name]   . Measles as a child  . Obesity   . Osteoarthritis   . Parkinson disease ([place])   . Plantar fasciitis of left foot 06/19/2012  . Preventative health care 12/19/2015  . Rheumatoid arthritis ([place]) 01/08/2018  . Rosacea 10/05/2014  . Swallowing difficulty   . Urinary  frequency 02/25/2013  . Visual floaters 05/11/2014    Past Surgical History:  Procedure Laterality Date  . ABDOMINAL HYSTERECTOMY  2006   total  . esophageal     stretching  . laporoscopy    . PARTIAL HIP ARTHROPLASTY  2010   right  . TONSILLECTOMY    . wisdom teeth extracted      There were no vitals filed for this visit.  Subjective Assessment - 09/02/18 1105    Subjective   "I fell better than I have in a long time"    Pertinent History  Parkinson's disease; RA diagnosis (spring 2019), depression, anxiety, HTN, OA, GERD, prediabetes, vitamin D deficiency, basal cell carcinoma of skin, hyperhydrosis, hyperlipidemia    Limitations  RA with hand and foot pain    Patient Stated Goals  improve writing, ability to open things, doing eye make-up, folding laundry, typing, unloading dishwasher, using screwdiver, donning shoes/socks    Currently in Pain?  No/denies        Reviewed recommendation for paraffin bath for bilateral hand pain at night (due to arthritis).  Checked goals and discussed progress, and pt completed functional tasks recording form.--see below.  Discussed follow up evaluations in approx 15month (or sooner if  needed).  Pt verbalized agreement.  Discussed schedule for sleep and avoiding electronics 1 hr prior to bedtime for improved sleep quality.  Also discussed cognitive changes typical to PD per pt questions.    Recommended/educated pt on use of loop velcro on home keys to help pt improve accuracy and decr errors.  Pt verbalized understanding and was issued velcro for home use.    Reviewed recommendations on how to incr difficulty with HEP/PWR! Moves.         OT Short Term Goals - 09/02/18 1145      OT SHORT TERM GOAL #1   Title  Pt will be independent with updated HEP.--check STGs 08/27/18    Time  4    Period  Weeks    Status  Achieved   08/30/18     OT SHORT TERM GOAL #2   Title  Pt will report unloading dishwasher as only "minimally difficult" (2)     Baseline  somewhat difficult (3)    Time  4    Period  Weeks    Status  Achieved   09/02/18:  not difficult (1)     OT SHORT TERM GOAL #3   Title  Pt will perform PPT#4 in less than 20 sec for incr ease with dressing.    Baseline  23.38sec    Time  4    Period  Weeks    Status  Achieved   09/02/18:  10.97sec     OT SHORT TERM GOAL #4   Title  Pt will report opening containers/boxes as only "somewhat difficult" (3)    Baseline  mod difficult (4)    Time  4    Period  Weeks    Status  Achieved   09/02/18:  minimally difficult (2)     OT SHORT TERM GOAL #5   Title  Pt will report folding laundry as "minimally difficult" (2)    Baseline  somewhat difficult (3)    Time  4    Period  Weeks    Status  Achieved   09/02/18:  met at this level       OT Long Term Goals - 09/02/18 1124      OT LONG TERM GOAL #1   Title  Pt will verbalize understanding of adaptive stratgies to incr. ease/efficiency and decr pain with ADLs/IADLs--check LTGs 09/27/17    Time  8    Period  Weeks    Status  Achieved   09/02/18     OT LONG TERM GOAL #2   Title  Pt will report typing on computer as "somewhat difficult" (3)    Baseline  mod difficult (4)    Time  8    Period  Weeks    Status  Achieved   09/02/18:  minimally difficulty (2)     OT LONG TERM GOAL #3   Title  Pt will improve functional reaching/coordination for ADLs as shown by improving score on box and blocks test by at least 3blocks with RUE.    Baseline  R-54 blocks    Time  8    Period  Weeks    Status  Achieved   09/02/18:  R-61blocks, L-62blocks     OT LONG TERM GOAL #4   Title  Pt will report donning R shoe/sock as only "somewhat difficult" (3).    Baseline  rated as very difficult (5) on Functional tasks recording form    Period  Weeks    Status  Achieved   09/02/18:  minimally difficult (2)     OT LONG TERM GOAL #5   Title  Pt will be able to write 4-5 sentences with 100% legibility and rate as "somewhat  difficult".    Baseline  very difficult, 90%    Time  8    Period  Weeks    Status  Achieved   09/02/18:  good legibility, "somewhat difficult" (3)     OT LONG TERM GOAL #6   Title  Pt will report unscrewing small screw as "somewhat difficult (3)."    Baseline  very difficult (5)    Time  8    Period  Weeks    Status  Achieved   09/02/18:  met at this level           Plan - 09/02/18 1107    Clinical Impression Statement  Pt has made excellent progress and met all goals.    Occupational Profile and client history currently impacting functional performance  Pt is independent with ADLs/IADLs, but now reports pain in hands affecting ADLs and incr difficulty for ADLs/IADLs and ability to participate in social/leisure activities as she previously has.    Occupational performance deficits (Please refer to evaluation for details):  ADL's;IADL's;Leisure;Social Participation    Rehab Potential  Good    OT Frequency  2x / week    OT Duration  8 weeks   +eval (or 16 visits)   OT Treatment/Interventions  Self-care/ADL training;Therapeutic exercise;Moist Heat;Paraffin;Neuromuscular education;Splinting;Patient/family education;Therapeutic activities;Functional Mobility Training;Energy conservation;Fluidtherapy;Cryotherapy;Ultrasound;Contrast Bath;DME and/or AE instruction;Manual Therapy;Passive range of motion;Cognitive remediation/compensation;Balance training    Plan  d/c OT; schedule OT/PT eval in approx 8 months due to progressive nature of diagnosis    Clinical Decision Making  Several treatment options, min-mod task modification necessary    Recommended Other Services  recent d/c from PT    Consulted and Agree with Plan of Care  Patient       Patient will benefit from skilled therapeutic intervention in order to improve the following deficits and impairments:  Decreased cognition, Decreased knowledge of use of DME, Pain, Decreased mobility, Decreased coordination, Decreased activity  tolerance, Decreased range of motion, Impaired tone, Improper spinal/pelvic alignment, Impaired UE functional use, Decreased knowledge of precautions, Decreased balance(bradykinesia)  Visit Diagnosis: Other symptoms and signs involving the nervous system  Other symptoms and signs involving the musculoskeletal system  Tremor  Pain in left hand  Other lack of coordination  Pain in right hand  Abnormal posture  Attention and concentration deficit  Other abnormalities of gait and mobility  Unsteadiness on feet    Problem List Patient Active Problem List   Diagnosis Date Noted  . Cough 05/10/2018  . Rheumatoid arthritis ([place]) 01/08/2018  . Hand pain, right 10/15/2017  . Vitamin D deficiency 12/20/2016  . Prediabetes 12/20/2016  . Shortness of breath on exertion 11/27/2016  . Preventative health care 12/19/2015  . Constipation 12/07/2015  . BCC (basal cell carcinoma of skin) 10/05/2014  . Rosacea 10/05/2014  . Parkinson's disease ([place]) 05/11/2014  . Visual floaters 05/11/2014  . Paralysis agitans ([place]) 01/08/2014  . Screening for cervical cancer 07/17/2012  . Foot pain, bilateral 06/19/2012  . 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 Obesity 11/12/2009  . Depression with anxiety 11/02/2009  . 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 Lumbar back pain with radiculopathy affecting lower extremity 10/17/2007  . Essential hypertension 07/05/2007  . Osteoarthritis 07/05/2007  . Hyperlipidemia 06/26/2007  . H/O: iron deficiency anemia 05/08/2007  . GERD  05/08/2007   OCCUPATIONAL THERAPY DISCHARGE SUMMARY  Visits from Start of Care: 9  Current functional level related to goals / functional outcomes: See above   Remaining deficits: Bradykinesia, mild decr coordination, tremor, rigidity, mild decr balance/posture   Education / Equipment: Pt educated in strategies for ADLs to incr ease/decr pain/incr safety, updates to HEP.  Pt verbalized understanding of all  education provided.  Plan: Patient agrees to discharge.  Patient goals were met. Patient is being discharged due to meeting the stated rehab goals.  And being pleased with current functional level.?????        [facility] 09/02/2018, 12:05 PM  [facility] [address] [place], [place], [address] Phone: [phone]   Fax:  [phone]  Name: Kristin [name] MRN: [id number] Date of Birth: 1951-01-22   Vianne Bulls, OTR/L [facility] [address] [place], [place]  [address] [phone] phone [phone] 09/02/18 12:05 PM

## 2018-09-03 ENCOUNTER — Encounter: Payer: Medicare Other | Admitting: Occupational Therapy

## 2018-09-08 ENCOUNTER — Other Ambulatory Visit: Payer: Self-pay | Admitting: Family Medicine

## 2018-09-08 DIAGNOSIS — I10 Essential (primary) hypertension: Secondary | ICD-10-CM

## 2018-09-09 DIAGNOSIS — F331 Major depressive disorder, recurrent, moderate: Secondary | ICD-10-CM | POA: Diagnosis not present

## 2018-09-10 ENCOUNTER — Encounter: Payer: Medicare Other | Admitting: Occupational Therapy

## 2018-09-10 MED ORDER — LOSARTAN POTASSIUM 100 MG PO TABS: 100.0000 mg | ORAL_TABLET | Freq: Every day | ORAL | 0 refills | Status: DC

## 2018-09-10 NOTE — Addendum Note (Signed)
Addended by: Magdalene Molly A on: 09/10/2018 09:41 AM   Modules accepted: Orders

## 2018-09-10 NOTE — Telephone Encounter (Signed)
Patient notified has appt w/pcp next week

## 2018-09-10 NOTE — Telephone Encounter (Signed)
Ok to switch but need to have a bp check with cmp in  2 weeks

## 2018-09-10 NOTE — Telephone Encounter (Signed)
Pharmacy comment: Alternative Requested:PT PAYING $ 48 FOR TELMISARTAN. PT REQUESTING LOWER OUT-OF-POCKET COST. SEE ALTERNATIVES AND ASSOCIATED COST: LOSARTAN $31.  Please advise

## 2018-09-12 ENCOUNTER — Encounter: Payer: Medicare Other | Admitting: Occupational Therapy

## 2018-09-13 ENCOUNTER — Ambulatory Visit: Payer: Medicare Other | Admitting: Family Medicine

## 2018-09-17 ENCOUNTER — Encounter: Payer: Medicare Other | Admitting: Occupational Therapy

## 2018-09-17 DIAGNOSIS — F331 Major depressive disorder, recurrent, moderate: Secondary | ICD-10-CM | POA: Diagnosis not present

## 2018-09-19 ENCOUNTER — Ambulatory Visit: Payer: Medicare Other | Admitting: Family Medicine

## 2018-09-20 ENCOUNTER — Other Ambulatory Visit: Payer: Self-pay | Admitting: Family Medicine

## 2018-09-26 DIAGNOSIS — F331 Major depressive disorder, recurrent, moderate: Secondary | ICD-10-CM | POA: Diagnosis not present

## 2018-10-07 DIAGNOSIS — F331 Major depressive disorder, recurrent, moderate: Secondary | ICD-10-CM | POA: Diagnosis not present

## 2018-10-10 DIAGNOSIS — Z85828 Personal history of other malignant neoplasm of skin: Secondary | ICD-10-CM | POA: Diagnosis not present

## 2018-10-10 DIAGNOSIS — L821 Other seborrheic keratosis: Secondary | ICD-10-CM | POA: Diagnosis not present

## 2018-10-10 DIAGNOSIS — L814 Other melanin hyperpigmentation: Secondary | ICD-10-CM | POA: Diagnosis not present

## 2018-10-10 DIAGNOSIS — D225 Melanocytic nevi of trunk: Secondary | ICD-10-CM | POA: Diagnosis not present

## 2018-10-10 DIAGNOSIS — Z23 Encounter for immunization: Secondary | ICD-10-CM | POA: Diagnosis not present

## 2018-10-16 ENCOUNTER — Ambulatory Visit: Payer: Medicare Other | Admitting: *Deleted

## 2018-10-16 NOTE — Progress Notes (Addendum)
Subjective:   Kristin [name] is a 68 y.o. female who presents for Medicare Annual/Subsequent preventive examination.  Husband dx w/ Leukemia 2017. Went for stem cell transplant 06/2016. Still struggles (weaker)  Participates in Parkinson's support group. Meets monthly for lunch. Counseling group every 2 weeks. Dance class once per week and spin 2x/wk.  Review of Systems: No ROS.  Medicare Wellness Visit. Additional risk factors are reflected in the social history. Cardiac Risk Factors include: advanced age (>68men, >53 women);dyslipidemia;hypertension;obesity (BMI >30kg/m2) Sleep patterns: 6-8 hrs per night. Home Safety/Smoke Alarms: Feels safe in home. Smoke alarms in place.  Lives with husband and grown son who has high functioning autism. 2 story home. Bedroom on 1st.  Female:   Pap- hysterectomy       Mammo-   03/21/18    Dexa scan-  Pt will discuss with PCP at appt in March     CCS- done 10/03/12- 10 yr recall     Objective:    Vitals: BP 132/80 (BP Location: Left Arm, Patient Position: Sitting, Cuff Size: Large)   Pulse 69   Ht 5\' 4"  (1.626 m)   Wt 243 lb (110.2 kg)   SpO2 95%   BMI 41.71 kg/m   Body mass index is 41.71 kg/m.  Advanced Directives 10/17/2018 07/29/2018 06/14/2018 10/15/2017 12/13/2016 11/03/2016  Does Patient Have a Medical Advance Directive? Yes Yes Yes Yes Yes Yes  Type of Paramedic of [place];Living will [place];Living will [place];Living will [place];Living will [place];Living will [place];Living will  Does patient want to make changes to medical advance directive? No - Patient declined - - No - Patient declined - No - Patient declined  Copy of [place] in Chart? Yes - validated most recent copy scanned in chart (See row information) - - Yes (No Data) Yes    Tobacco Social History   Tobacco Use  Smoking  Status Never Smoker  Smokeless Tobacco Never Used     Counseling given: Not Answered   Clinical Intake:     Pain : No/denies pain                 Past Medical History:  Diagnosis Date  . Abnormal vaginal Pap smear   . Acute pharyngitis 02/25/2013  . Anemia   . Anxiety   . Arthritis   . 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 Chicken pox as a child  . Chronic UTI    sees dr Terance Hart  . Constipation 12/07/2015  . Depression with anxiety 11/02/2009   Qualifier: Diagnosis of  By: [name], LPN, [name]   . Dermatitis 07/17/2012  . Esophageal stricture 1994  . Fibroids   . Foot pain, bilateral 06/19/2012  . GERD (gastroesophageal reflux disease)   . GERD (gastroesophageal reflux disease)   . Hiatal hernia   . Hyperglycemia 08/19/2013  . 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 Hyperlipidemia   . Hypertension   . Infertility, female   . Low back pain 10/17/2007   Qualifier: Diagnosis of  By: [name], [name]   . Measles as a child  . Obesity   . Osteoarthritis   . Parkinson disease ([place])   . Plantar fasciitis of left foot 06/19/2012  . Preventative health care 12/19/2015  . Rheumatoid arthritis ([place]) 01/08/2018  . Rosacea 10/05/2014  . Swallowing difficulty   . Urinary frequency  02/25/2013  . Visual floaters 05/11/2014   Past Surgical History:  Procedure Laterality Date  . ABDOMINAL HYSTERECTOMY  2006   total  . esophageal     stretching  . laporoscopy    . PARTIAL HIP ARTHROPLASTY  2010   right  . TONSILLECTOMY    . wisdom teeth extracted     Family History  Problem Relation Age of Onset  . Cancer Mother        breast  . Other Mother        arrythmia  . Mental illness Mother        bipolar  . Hyperlipidemia Mother   . Thyroid disease Mother   . Depression Mother   . Bipolar disorder Mother   . Heart disease Father   . Arthritis Father        rheumatoid  . Hypertension Father   . Hyperlipidemia Father   . Depression Sister   .  Mental illness Sister        bipolar  . Parkinson's disease Sister   . Arthritis Sister   . Arthritis Sister   . Arthritis Sister   . Colon cancer Neg Hx   . Esophageal cancer Neg Hx   . Rectal cancer Neg Hx   . Stomach cancer Neg Hx    Social History   Socioeconomic History  . Marital status: Married    Spouse name: Not on file  . Number of children: Not on file  . Years of education: Not on file  . Highest education level: Not on file  Occupational History  . Occupation: Retired Teacher, music  Social Needs  . Financial resource strain: Not on file  . Food insecurity:    Worry: Not on file    Inability: Not on file  . Transportation needs:    Medical: Not on file    Non-medical: Not on file  Tobacco Use  . Smoking status: Never Smoker  . Smokeless tobacco: Never Used  Substance and Sexual Activity  . Alcohol use: Yes    Alcohol/week: 4.0 standard drinks    Types: 4 Glasses of wine per week    Comment: occasionally  . Drug use: No  . Sexual activity: Not Currently    Partners: Male  Lifestyle  . Physical activity:    Days per week: Not on file    Minutes per session: Not on file  . Stress: Not on file  Relationships  . Social connections:    Talks on phone: Not on file    Gets together: Not on file    Attends religious service: Not on file    Active member of club or organization: Not on file    Attends meetings of clubs or organizations: Not on file    Relationship status: Not on file  Other Topics Concern  . Not on file  Social History Narrative   Lives with husband, no major dietary restrictions, retired from teaching      Husband dx with cancer MDS June 2017.   Currently in remission. (11/03/16 pc)       Outpatient Encounter Medications as of 10/17/2018  Medication Sig  . ALPRAZolam (XANAX) 0.25 MG tablet TAKE 1/2 TO 1 TABLET TWICE A DAY AS NEEDED FOR ANXIETY  . B-D TB SYRINGE 1CC/27GX1/2" 27G X 1/2" 1 ML MISC   . carbidopa-levodopa (SINEMET CR)  50-200 MG tablet TAKE 1 TABLET BY MOUTH EVERYDAY AT BEDTIME  . carbidopa-levodopa (SINEMET IR) 25-100 MG tablet 2 in  the morning, 2 in the afternoon, 1 in the evening  . DULoxetine (CYMBALTA) 30 MG capsule TAKE 1 CAPSULE BY MOUTH EVERY DAY  . escitalopram (LEXAPRO) 20 MG tablet Take 1 tablet (20 mg total) by mouth daily.  . folic acid (FOLVITE) 1 MG tablet   . losartan (COZAAR) 100 MG tablet Take 1 tablet (100 mg total) by mouth daily.  . Methotrexate Sodium (METHOTREXATE, PF,) 50 MG/2ML injection   . omeprazole (PRILOSEC) 20 MG capsule TAKE 1 CAPSULE BY MOUTH EVERY DAY  . polyethylene glycol powder (GLYCOLAX/MIRALAX) powder Take 17 g by mouth 2 (two) times daily as needed (1 to 2 times per day, as needed).  . pramipexole (MIRAPEX) 0.5 MG tablet TAKE 2 TABLETS BY MOUTH IN THE MORNING AND 1 TABLET IN THE AFTERNOON AND 1 TABLET IN THE EVENING  . rosuvastatin (CRESTOR) 10 MG tablet TAKE 1 TABLET BY MOUTH ON MONDAYS, WEDNESDAYS AND FRIDAYS   No facility-administered encounter medications on file as of 10/17/2018.     Activities of Daily Living In your present state of health, do you have any difficulty performing the following activities: 10/17/2018  Hearing? N  Vision? N  Difficulty concentrating or making decisions? N  Walking or climbing stairs? N  Dressing or bathing? N  Doing errands, shopping? N  Preparing Food and eating ? N  Using the Toilet? N  In the past six months, have you accidently leaked urine? N  Do you have problems with loss of bowel control? N  Managing your Medications? N  Managing your Finances? N  Housekeeping or managing your Housekeeping? N  Comment housekeeper every 2 weeks.  Some recent data might be hidden    Patient Care Team: Mosie Lukes, MD as PCP - General (Family Medicine) Tat, Eustace Quail, DO as Consulting Physician (Neurology)   Assessment:   This is a routine wellness examination for [facility]. Physical assessment deferred to PCP.  Exercise  Activities and Dietary recommendations Current Exercise Habits: Structured exercise class(spin and dance class), Frequency (Times/Week): 3, Intensity: Mild, Exercise limited by: None identified Diet (meal preparation, eat out, water intake, caffeinated beverages, dairy products, fruits and vegetables): on weight watchers diet x 14months   Goals    . Continue weight watcher    . Increase physical activity     Exercise 4-6 days per week    . Keep a postive mindset. (pt-stated)       Fall Risk Fall Risk  10/17/2018 08/15/2018 03/26/2018 10/23/2017 10/15/2017  Falls in the past year? 0 0 No No No  Number falls in past yr: - 0 - - -  Injury with Fall? - 0 - - -  Follow up - Falls evaluation completed - - -    Depression Screen PHQ 2/9 Scores 10/17/2018 10/15/2017 05/10/2017 11/27/2016  PHQ - 2 Score 2 2 0 4  PHQ- 9 Score 3 2 0 13    Cognitive Function Ad8 score reviewed for issues:  Issues making decisions:no  Less interest in hobbies / activities:no  Repeats questions, stories (family complaining):no  Trouble using ordinary gadgets (microwave, computer, phone):no  Forgets the month or year: no  Mismanaging finances: no  Remembering appts:no  Daily problems with thinking and/or memory:no Ad8 score is=0    MMSE - Mini Mental State Exam 10/15/2017  Orientation to time 5  Orientation to Place 5  Registration 3  Attention/ Calculation 5  Recall 3  Language- name 2 objects 2  Language- repeat 1  Language- follow 3  step command 3  Language- read & follow direction 1  Write a sentence 1  Copy design 1  Total score 30        Immunization History  Administered Date(s) Administered  . Influenza Split 06/19/2012  . Influenza Whole 07/05/2007, 07/08/2009  . Influenza, High Dose Seasonal PF 05/04/2016, 05/10/2017, 05/07/2018  . Influenza,inj,Quad PF,6+ Mos 07/10/2013, 05/05/2014  . Pneumococcal Conjugate-13 07/10/2013  . Pneumococcal Polysaccharide-23 12/07/2015  . Td  09/04/2004  . Tdap 01/04/2015  . Zoster Recombinat (Shingrix) 12/20/2017, 03/11/2018   Screening Tests Health Maintenance  Topic Date Due  . MAMMOGRAM  03/21/2020  . COLONOSCOPY  10/03/2022  . TETANUS/TDAP  01/03/2025  . INFLUENZA VACCINE  Completed  . DEXA SCAN  Completed  . Hepatitis C Screening  Completed  . PNA vac Low Risk Adult  Completed       Plan:    Please schedule your next medicare wellness visit with me in 1 yr.  Continue to eat heart healthy diet (full of fruits, vegetables, whole grains, lean protein, water--limit salt, fat, and sugar intake) and increase physical activity as tolerated.  Continue doing brain stimulating activities (puzzles, reading, adult coloring books, staying active) to keep memory sharp.    I have personally reviewed and noted the following in the patient's chart:   . Medical and social history . Use of alcohol, tobacco or illicit drugs  . Current medications and supplements . Functional ability and status . Nutritional status . Physical activity . Advanced directives . List of other physicians . Hospitalizations, surgeries, and ER visits in previous 12 months . Vitals . Screenings to include cognitive, depression, and falls . Referrals and appointments  In addition, I have reviewed and discussed with patient certain preventive protocols, quality metrics, and best practice recommendations. A written personalized care plan for preventive services as well as general preventive health recommendations were provided to patient.     Naaman Plummer [place], [place]  10/17/2018  Kathlene November, MD

## 2018-10-17 ENCOUNTER — Encounter: Payer: Self-pay | Admitting: *Deleted

## 2018-10-17 ENCOUNTER — Ambulatory Visit (INDEPENDENT_AMBULATORY_CARE_PROVIDER_SITE_OTHER): Payer: Medicare Other | Admitting: *Deleted

## 2018-10-17 VITALS — BP 132/80 | HR 69 | Ht 64.0 in | Wt 243.0 lb

## 2018-10-17 DIAGNOSIS — Z0000 Encounter for general adult medical examination without abnormal findings: Secondary | ICD-10-CM | POA: Diagnosis not present

## 2018-10-17 NOTE — Patient Instructions (Signed)
Please schedule your next medicare wellness visit with me in 1 yr.  Continue to eat heart healthy diet (full of fruits, vegetables, whole grains, lean protein, water--limit salt, fat, and sugar intake) and increase physical activity as tolerated.  Continue doing brain stimulating activities (puzzles, reading, adult coloring books, staying active) to keep memory sharp.    Ms. Kristin [name] , Thank you for taking time to come for your Medicare Wellness Visit. I appreciate your ongoing commitment to your health goals. Please review the following plan we discussed and let me know if I can assist you in the future.   These are the goals we discussed: Goals    . Continue weight watcher    . Increase physical activity     Exercise 4-6 days per week    . Keep a postive mindset. (pt-stated)       This is a list of the screening recommended for you and due dates:  Health Maintenance  Topic Date Due  . Mammogram  03/21/2020  . Colon Cancer Screening  10/03/2022  . Tetanus Vaccine  01/03/2025  . Flu Shot  Completed  . DEXA scan (bone density measurement)  Completed  .  Hepatitis C: One time screening is recommended by Center for Disease Control  (CDC) for  adults born from [birth year] through 1966.   Completed  . Pneumonia vaccines  Completed    Health Maintenance After Age 75 After age 60, you are at a higher risk for certain long-term diseases and infections as well as injuries from falls. Falls are a major cause of broken bones and head injuries in people who are older than age 35. Getting regular preventive care can help to keep you healthy and well. Preventive care includes getting regular testing and making lifestyle changes as recommended by your health care provider. Talk with your health care provider about:  Which screenings and tests you should have. A screening is a test that checks for a disease when you have no symptoms.  A diet and exercise plan that is right for you. What should I know  about screenings and tests to prevent falls? Screening and testing are the best ways to find a health problem early. Early diagnosis and treatment give you the best chance of managing medical conditions that are common after age 59. Certain conditions and lifestyle choices may make you more likely to have a fall. Your health care provider may recommend:  Regular vision checks. Poor vision and conditions such as cataracts can make you more likely to have a fall. If you wear glasses, make sure to get your prescription updated if your vision changes.  Medicine review. Work with your health care provider to regularly review all of the medicines you are taking, including over-the-counter medicines. Ask your health care provider about any side effects that may make you more likely to have a fall. Tell your health care provider if any medicines that you take make you feel dizzy or sleepy.  Osteoporosis screening. Osteoporosis is a condition that causes the bones to get weaker. This can make the bones weak and cause them to break more easily.  Blood pressure screening. Blood pressure changes and medicines to control blood pressure can make you feel dizzy.  Strength and balance checks. Your health care provider may recommend certain tests to check your strength and balance while standing, walking, or changing positions.  Foot health exam. Foot pain and numbness, as well as not wearing proper footwear, can make you more  likely to have a fall.  Depression screening. You may be more likely to have a fall if you have a fear of falling, feel emotionally low, or feel unable to do activities that you used to do.  Alcohol use screening. Using too much alcohol can affect your balance and may make you more likely to have a fall. What actions can I take to lower my risk of falls? General instructions  Talk with your health care provider about your risks for falling. Tell your health care provider if: ? You fall.  Be sure to tell your health care provider about all falls, even ones that seem minor. ? You feel dizzy, sleepy, or off-balance.  Take over-the-counter and prescription medicines only as told by your health care provider. These include any supplements.  Eat a healthy diet and maintain a healthy weight. A healthy diet includes low-fat dairy products, low-fat (lean) meats, and fiber from whole grains, beans, and lots of fruits and vegetables. Home safety  Remove any tripping hazards, such as rugs, cords, and clutter.  Install safety equipment such as grab bars in bathrooms and safety rails on stairs.  Keep rooms and walkways well-lit. Activity   Follow a regular exercise program to stay fit. This will help you maintain your balance. Ask your health care provider what types of exercise are appropriate for you.  If you need a cane or walker, use it as recommended by your health care provider.  Wear supportive shoes that have nonskid soles. Lifestyle  Do not drink alcohol if your health care provider tells you not to drink.  If you drink alcohol, limit how much you have: ? 0-1 drink a day for women. ? 0-2 drinks a day for men.  Be aware of how much alcohol is in your drink. In the [place] one drink equals one typical bottle of beer (12 oz), one-half glass of wine (5 oz), or one shot of hard liquor (1 oz).  Do not use any products that contain nicotine or tobacco, such as cigarettes and e-cigarettes. If you need help quitting, ask your health care provider. Summary  Having a healthy lifestyle and getting preventive care can help to protect your health and wellness after age 85.  Screening and testing are the best way to find a health problem early and help you avoid having a fall. Early diagnosis and treatment give you the best chance for managing medical conditions that are more common for people who are older than age 31.  Falls are a major cause of broken bones and head injuries in  people who are older than age 13. Take precautions to prevent a fall at home.  Work with your health care provider to learn what changes you can make to improve your health and wellness and to prevent falls. This information is not intended to replace advice given to you by your health care provider. Make sure you discuss any questions you have with your health care provider. Document Released: 07/04/2017 Document Revised: 07/04/2017 Document Reviewed: 07/04/2017 Elsevier Interactive Patient Education  2019 [employer].

## 2018-10-21 DIAGNOSIS — F331 Major depressive disorder, recurrent, moderate: Secondary | ICD-10-CM | POA: Diagnosis not present

## 2018-11-05 ENCOUNTER — Ambulatory Visit (INDEPENDENT_AMBULATORY_CARE_PROVIDER_SITE_OTHER): Payer: Medicare Other | Admitting: Family Medicine

## 2018-11-05 ENCOUNTER — Encounter: Payer: Self-pay | Admitting: Family Medicine

## 2018-11-05 VITALS — BP 118/70 | HR 79 | Temp 98.2°F | Resp 18 | Wt 247.0 lb

## 2018-11-05 DIAGNOSIS — M069 Rheumatoid arthritis, unspecified: Secondary | ICD-10-CM

## 2018-11-05 DIAGNOSIS — R7303 Prediabetes: Secondary | ICD-10-CM

## 2018-11-05 DIAGNOSIS — F418 Other specified anxiety disorders: Secondary | ICD-10-CM

## 2018-11-05 DIAGNOSIS — G20 Parkinson's disease: Secondary | ICD-10-CM | POA: Diagnosis not present

## 2018-11-05 DIAGNOSIS — E559 Vitamin D deficiency, unspecified: Secondary | ICD-10-CM

## 2018-11-05 DIAGNOSIS — K5909 Other constipation: Secondary | ICD-10-CM | POA: Diagnosis not present

## 2018-11-05 DIAGNOSIS — I10 Essential (primary) hypertension: Secondary | ICD-10-CM | POA: Diagnosis not present

## 2018-11-05 DIAGNOSIS — Z79899 Other long term (current) drug therapy: Secondary | ICD-10-CM

## 2018-11-05 DIAGNOSIS — E785 Hyperlipidemia, unspecified: Secondary | ICD-10-CM

## 2018-11-05 LAB — CBC
HCT: 37.6 % (ref 36.0–46.0)
Hemoglobin: 12 g/dL (ref 12.0–15.0)
MCHC: 32 g/dL (ref 30.0–36.0)
MCV: 84.3 fl (ref 78.0–100.0)
Platelets: 237 10*3/uL (ref 150.0–400.0)
RBC: 4.46 Mil/uL (ref 3.87–5.11)
RDW: 16.1 % — ABNORMAL HIGH (ref 11.5–15.5)
WBC: 5.4 10*3/uL (ref 4.0–10.5)

## 2018-11-05 LAB — COMPREHENSIVE METABOLIC PANEL
ALT: 5 U/L (ref 0–35)
AST: 18 U/L (ref 0–37)
Albumin: 4.1 g/dL (ref 3.5–5.2)
Alkaline Phosphatase: 91 U/L (ref 39–117)
BUN: 16 mg/dL (ref 6–23)
CO2: 26 mEq/L (ref 19–32)
Calcium: 9 mg/dL (ref 8.4–10.5)
Chloride: 107 mEq/L (ref 96–112)
Creatinine, Ser: 0.65 mg/dL (ref 0.40–1.20)
GFR: 90.51 mL/min (ref >60.00)
Glucose, Bld: 95 mg/dL (ref 70–99)
Potassium: 4.5 mEq/L (ref 3.5–5.1)
Sodium: 142 mEq/L (ref 135–145)
Total Bilirubin: 0.5 mg/dL (ref 0.2–1.2)
Total Protein: 6.4 g/dL (ref 6.0–8.3)

## 2018-11-05 LAB — LIPID PANEL
Cholesterol: 163 mg/dL (ref 0–200)
HDL: 64.8 mg/dL (ref >39.00)
LDL Cholesterol: 81 mg/dL (ref 0–99)
NonHDL: 98.5
Total CHOL/HDL Ratio: 3
Triglycerides: 87 mg/dL (ref 0.0–149.0)
VLDL: 17.4 mg/dL (ref 0.0–40.0)

## 2018-11-05 LAB — TSH: TSH: 1.38 u[IU]/mL (ref 0.35–4.50)

## 2018-11-05 MED ORDER — PRAMIPEXOLE DIHYDROCHLORIDE 0.5 MG PO TABS: 0.5000 mg | ORAL_TABLET | Freq: Three times a day (TID) | ORAL | 1 refills | Status: DC

## 2018-11-05 MED ORDER — ALPRAZOLAM 0.25 MG PO TABS: ORAL_TABLET | ORAL | 1 refills | Status: DC

## 2018-11-05 NOTE — Assessment & Plan Note (Addendum)
Following with neurology stable medications. Did drop the Mirapex to 3 tabs daily and it has helped some OCD symptoms.

## 2018-11-05 NOTE — Assessment & Plan Note (Signed)
hgba1c acceptable, minimize simple carbs. Increase exercise as tolerated.  

## 2018-11-05 NOTE — Assessment & Plan Note (Signed)
Following with [facility] and she is having trouble using the MTX injections. The pharmacist is telling her she only has the MTX single use vile but the doctor is saying she can use the vial multiple times and somehow she is stuck in the middle not knowing what to do and not being able to get refills. As a result she has set up an appointment at [facility].

## 2018-11-05 NOTE — Assessment & Plan Note (Signed)
Supplement and monitor 

## 2018-11-05 NOTE — Progress Notes (Signed)
Subjective:    Patient ID: Kristin [name], female    DOB: [birth year], 68 y.o.   MRN: [id number]  No chief complaint on file.   HPI Patient is in today for follow up. She has not used her Methotrexate as prescribed due to some fear of what happened to her Dad after he started it. sheis willing to take it now but she is getting conflicting information from the pharmacist and her rheumatologist. She is interested in a second opinion at [facility]. She is otherwise doing better. No recent febrile illness or hospitalizations. Denies CP/palp/SOB/HA/congestion/fevers/GI or GU c/o. Taking meds as prescribed  Past Medical History:  Diagnosis Date  . Abnormal vaginal Pap smear   . Acute pharyngitis 02/25/2013  . Anemia   . Anxiety   . Arthritis   . 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 Chicken pox as a child  . Chronic UTI    sees dr Terance Hart  . Constipation 12/07/2015  . Depression with anxiety 11/02/2009   Qualifier: Diagnosis of  By: [name], LPN, [name]   . Dermatitis 07/17/2012  . Esophageal stricture 1994  . Fibroids   . Foot pain, bilateral 06/19/2012  . GERD (gastroesophageal reflux disease)   . GERD (gastroesophageal reflux disease)   . Hiatal hernia   . Hyperglycemia 08/19/2013  . 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 Hyperlipidemia   . Hypertension   . Infertility, female   . Low back pain 10/17/2007   Qualifier: Diagnosis of  By: [name], [name]   . Measles as a child  . Obesity   . Osteoarthritis   . Parkinson disease ([place])   . Plantar fasciitis of left foot 06/19/2012  . Preventative health care 12/19/2015  . Rheumatoid arthritis ([place]) 01/08/2018  . Rosacea 10/05/2014  . Swallowing difficulty   . Urinary frequency 02/25/2013  . Visual floaters 05/11/2014    Past Surgical History:  Procedure Laterality Date  . ABDOMINAL HYSTERECTOMY  2006   total  . esophageal     stretching  . laporoscopy    . PARTIAL HIP ARTHROPLASTY  2010   right  . TONSILLECTOMY     . wisdom teeth extracted      Family History  Problem Relation Age of Onset  . Cancer Mother        breast  . Other Mother        arrythmia  . Mental illness Mother        bipolar  . Hyperlipidemia Mother   . Thyroid disease Mother   . Depression Mother   . Bipolar disorder Mother   . Heart disease Father   . Arthritis Father        rheumatoid  . Hypertension Father   . Hyperlipidemia Father   . Depression Sister   . Mental illness Sister        bipolar  . Parkinson's disease Sister   . Arthritis Sister   . Arthritis Sister   . Arthritis Sister   . Colon cancer Neg Hx   . Esophageal cancer Neg Hx   . Rectal cancer Neg Hx   . Stomach cancer Neg Hx     Social History   Socioeconomic History  . Marital status: Married    Spouse name: Not on file  . Number of children: Not on file  . Years of education: Not on file  . Highest education level: Not on file  Occupational History  . Occupation: Retired  Admin instructor  Social Needs  . Financial resource strain: Not on file  . Food insecurity:    Worry: Not on file    Inability: Not on file  . Transportation needs:    Medical: Not on file    Non-medical: Not on file  Tobacco Use  . Smoking status: Never Smoker  . Smokeless tobacco: Never Used  Substance and Sexual Activity  . Alcohol use: Yes    Alcohol/week: 4.0 standard drinks    Types: 4 Glasses of wine per week    Comment: occasionally  . Drug use: No  . Sexual activity: Not Currently    Partners: Male  Lifestyle  . Physical activity:    Days per week: Not on file    Minutes per session: Not on file  . Stress: Not on file  Relationships  . Social connections:    Talks on phone: Not on file    Gets together: Not on file    Attends religious service: Not on file    Active member of club or organization: Not on file    Attends meetings of clubs or organizations: Not on file    Relationship status: Not on file  . Intimate partner violence:     Fear of current or ex partner: Not on file    Emotionally abused: Not on file    Physically abused: Not on file    Forced sexual activity: Not on file  Other Topics Concern  . Not on file  Social History Narrative   Lives with husband, no major dietary restrictions, retired from teaching      Husband dx with cancer MDS June 2017.   Currently in remission. (11/03/16 pc)       Outpatient Medications Prior to Visit  Medication Sig Dispense Refill  . B-D TB SYRINGE 1CC/27GX1/2" 27G X 1/2" 1 ML MISC     . carbidopa-levodopa (SINEMET CR) 50-200 MG tablet TAKE 1 TABLET BY MOUTH EVERYDAY AT BEDTIME 90 tablet 1  . carbidopa-levodopa (SINEMET IR) 25-100 MG tablet 2 in the morning, 2 in the afternoon, 1 in the evening 450 tablet 1  . DULoxetine (CYMBALTA) 30 MG capsule TAKE 1 CAPSULE BY MOUTH EVERY DAY 90 capsule 0  . escitalopram (LEXAPRO) 20 MG tablet Take 1 tablet (20 mg total) by mouth daily. 90 tablet 2  . folic acid (FOLVITE) 1 MG tablet     . losartan (COZAAR) 100 MG tablet Take 1 tablet (100 mg total) by mouth daily. 90 tablet 0  . Methotrexate Sodium (METHOTREXATE, PF,) 50 MG/2ML injection     . omeprazole (PRILOSEC) 20 MG capsule TAKE 1 CAPSULE BY MOUTH EVERY DAY 90 capsule 1  . polyethylene glycol powder (GLYCOLAX/MIRALAX) powder Take 17 g by mouth 2 (two) times daily as needed (1 to 2 times per day, as needed). 850 g 0  . rosuvastatin (CRESTOR) 10 MG tablet TAKE 1 TABLET BY MOUTH ON MONDAYS, WEDNESDAYS AND FRIDAYS 54 tablet 1  . ALPRAZolam (XANAX) 0.25 MG tablet TAKE 1/2 TO 1 TABLET TWICE A DAY AS NEEDED FOR ANXIETY 30 tablet 1  . pramipexole (MIRAPEX) 0.5 MG tablet TAKE 2 TABLETS BY MOUTH IN THE MORNING AND 1 TABLET IN THE AFTERNOON AND 1 TABLET IN THE EVENING 360 tablet 1   No facility-administered medications prior to visit.     Allergies  Allergen Reactions  . Bactrim [Sulfamethoxazole-Trimethoprim] Other (See Comments)    Oral ulcers and rash  . Penicillins Hives    Review  of Systems  Constitutional: Positive for malaise/fatigue. Negative for fever.  HENT: Negative for congestion.   Eyes: Negative for blurred vision.  Respiratory: Negative for shortness of breath.   Cardiovascular: Negative for chest pain, palpitations and leg swelling.  Gastrointestinal: Negative for abdominal pain, blood in stool and nausea.  Genitourinary: Negative for dysuria and frequency.  Musculoskeletal: Negative for falls.  Skin: Negative for rash.  Neurological: Negative for dizziness, loss of consciousness and headaches.  Endo/Heme/Allergies: Negative for environmental allergies.  Psychiatric/Behavioral: Negative for depression.       Objective:    Physical Exam Vitals signs and nursing note reviewed.  Constitutional:      General: She is not in acute distress.    Appearance: She is well-developed.  HENT:     Head: Normocephalic and atraumatic.     Nose: Nose normal.  Eyes:     General:        Right eye: No discharge.        Left eye: No discharge.  Neck:     Musculoskeletal: Normal range of motion and neck supple.  Cardiovascular:     Rate and Rhythm: Normal rate and regular rhythm.     Heart sounds: No murmur.  Pulmonary:     Effort: Pulmonary effort is normal.     Breath sounds: Normal breath sounds.  Abdominal:     General: Bowel sounds are normal.     Palpations: Abdomen is soft.     Tenderness: There is no abdominal tenderness.  Skin:    General: Skin is warm and dry.  Neurological:     Mental Status: She is alert and oriented to person, place, and time.     BP 118/70 (BP Location: Left Arm, Patient Position: Sitting, Cuff Size: Normal)   Pulse 79   Temp 98.2 F (36.8 C) (Oral)   Resp 18   Wt 247 lb (112 kg)   SpO2 98%   BMI 42.40 kg/m  Wt Readings from Last 3 Encounters:  11/05/18 247 lb (112 kg)  10/17/18 243 lb (110.2 kg)  08/15/18 244 lb (110.7 kg)     Lab Results  Component Value Date   WBC 5.0 05/10/2018   HGB 12.0 05/10/2018    HCT 36.5 05/10/2018   PLT 226.0 05/10/2018   GLUCOSE 103 (H) 05/10/2018   CHOL 152 05/10/2018   TRIG 82.0 05/10/2018   HDL 62.00 05/10/2018   LDLDIRECT 161.5 10/07/2008   LDLCALC 74 05/10/2018   ALT 7 05/10/2018   AST 17 05/10/2018   NA 144 05/10/2018   K 4.5 05/10/2018   CL 108 05/10/2018   CREATININE 0.78 05/10/2018   BUN 17 05/10/2018   CO2 31 05/10/2018   TSH 1.51 05/10/2018   INR 2.5 (H) 01/19/2009   HGBA1C 6.1 05/10/2018    Lab Results  Component Value Date   TSH 1.51 05/10/2018   Lab Results  Component Value Date   WBC 5.0 05/10/2018   HGB 12.0 05/10/2018   HCT 36.5 05/10/2018   MCV 86.5 05/10/2018   PLT 226.0 05/10/2018   Lab Results  Component Value Date   NA 144 05/10/2018   K 4.5 05/10/2018   CO2 31 05/10/2018   GLUCOSE 103 (H) 05/10/2018   BUN 17 05/10/2018   CREATININE 0.78 05/10/2018   BILITOT 0.6 05/10/2018   ALKPHOS 72 05/10/2018   AST 17 05/10/2018   ALT 7 05/10/2018   PROT 6.3 05/10/2018   ALBUMIN 4.0 05/10/2018   CALCIUM 9.2 05/10/2018  GFR 78.06 05/10/2018   Lab Results  Component Value Date   CHOL 152 05/10/2018   Lab Results  Component Value Date   HDL 62.00 05/10/2018   Lab Results  Component Value Date   LDLCALC 74 05/10/2018   Lab Results  Component Value Date   TRIG 82.0 05/10/2018   Lab Results  Component Value Date   CHOLHDL 2 05/10/2018   Lab Results  Component Value Date   HGBA1C 6.1 05/10/2018       Assessment & Plan:   Problem List Items Addressed This Visit    Hyperlipidemia    Tolerating statin, encouraged heart healthy diet, avoid trans fats, minimize simple carbs and saturated fats. Increase exercise as tolerated      Relevant Orders   Lipid panel   Depression with anxiety    UDS updated and Alprazolam is refilled. Uses infrequently      Relevant Medications   ALPRAZolam (XANAX) 0.25 MG tablet   Essential hypertension    Denies CP/palp/SOB/HA/congestion/fevers/GI or GU c/o. Taking meds as  prescribed      Relevant Orders   CBC   Comprehensive metabolic panel   TSH   Parkinson's disease ([place])    Following with neurology stable medications. Did drop the Mirapex to 3 tabs daily and it has helped some OCD symptoms.       Relevant Medications   pramipexole (MIRAPEX) 0.5 MG tablet   Constipation    Encouraged increased hydration and fiber in diet. Daily probiotics. If bowels not moving can use MOM 2 tbls po in 4 oz of warm prune juice by mouth every 2-3 days. If no results then repeat in 4 hours with  Dulcolax suppository pr, may repeat again in 4 more hours as needed. Seek care if symptoms worsen. Consider daily Miralax and/or Dulcolax if symptoms persist. Is improved since using Miralax and benefiber together. Can use this twice daily      Vitamin D deficiency    Supplement and monitor      Relevant Orders   Vitamin D 1,25 dihydroxy   Prediabetes    hgba1c acceptable, minimize simple carbs. Increase exercise as tolerated      Rheumatoid arthritis ([place])    Following with [facility] and she is having trouble using the MTX injections. The pharmacist is telling her she only has the MTX single use vile but the doctor is saying she can use the vial multiple times and somehow she is stuck in the middle not knowing what to do and not being able to get refills. As a result she has set up an appointment at [facility]       Relevant Orders   Ambulatory referral to Rheumatology    Other Visit Diagnoses    High risk medication use    -  Primary   Relevant Orders   Pain Mgmt, Profile 8 w/Conf, U      I have changed [name]. Mcisaac's pramipexole. I am also having her maintain her polyethylene glycol powder, escitalopram, folic acid, methotrexate (PF), B-D TB SYRINGE 1CC/27GX1/2", carbidopa-levodopa, carbidopa-levodopa, rosuvastatin, omeprazole, losartan, DULoxetine, and ALPRAZolam.  Meds ordered this encounter  Medications  . ALPRAZolam (XANAX) 0.25 MG tablet    Sig: TAKE  1/2 TO 1 TABLET TWICE A DAY AS NEEDED FOR ANXIETY    Dispense:  30 tablet    Refill:  1    This request is for a new prescription for a controlled substance as required by Federal/State law.  . pramipexole (  MIRAPEX) 0.5 MG tablet    Sig: Take 1 tablet (0.5 mg total) by mouth 3 (three) times daily.    Dispense:  360 tablet    Refill:  1     [name], MD

## 2018-11-05 NOTE — Assessment & Plan Note (Signed)
Tolerating statin, encouraged heart healthy diet, avoid trans fats, minimize simple carbs and saturated fats. Increase exercise as tolerated 

## 2018-11-05 NOTE — Assessment & Plan Note (Signed)
Encouraged increased hydration and fiber in diet. Daily probiotics. If bowels not moving can use MOM 2 tbls po in 4 oz of warm prune juice by mouth every 2-3 days. If no results then repeat in 4 hours with  Dulcolax suppository pr, may repeat again in 4 more hours as needed. Seek care if symptoms worsen. Consider daily Miralax and/or Dulcolax if symptoms persist. Is improved since using Miralax and benefiber together. Can use this twice daily

## 2018-11-05 NOTE — Assessment & Plan Note (Signed)
Denies CP/palp/SOB/HA/congestion/fevers/GI or GU c/o. Taking meds as prescribed 

## 2018-11-05 NOTE — Assessment & Plan Note (Signed)
UDS updated and Alprazolam is refilled. Uses infrequently

## 2018-11-05 NOTE — Patient Instructions (Signed)
Carbohydrate Counting for Diabetes Mellitus, Adult  Carbohydrate counting is a method of keeping track of how many carbohydrates you eat. Eating carbohydrates naturally increases the amount of sugar (glucose) in the blood. Counting how many carbohydrates you eat helps keep your blood glucose within normal limits, which helps you manage your diabetes (diabetes mellitus). It is important to know how many carbohydrates you can safely have in each meal. This is different for every person. A diet and nutrition specialist (registered dietitian) can help you make a meal plan and calculate how many carbohydrates you should have at each meal and snack. Carbohydrates are found in the following foods:  Grains, such as breads and cereals.  Dried beans and soy products.  Starchy vegetables, such as potatoes, peas, and corn.  Fruit and fruit juices.  Milk and yogurt.  Sweets and snack foods, such as cake, cookies, candy, chips, and soft drinks. How do I count carbohydrates? There are two ways to count carbohydrates in food. You can use either of the methods or a combination of both. Reading "Nutrition Facts" on packaged food The "Nutrition Facts" list is included on the labels of almost all packaged foods and beverages in the [place] It includes:  The serving size.  Information about nutrients in each serving, including the grams (g) of carbohydrate per serving. To use the "Nutrition Facts":  Decide how many servings you will have.  Multiply the number of servings by the number of carbohydrates per serving.  The resulting number is the total amount of carbohydrates that you will be having. Learning standard serving sizes of other foods When you eat carbohydrate foods that are not packaged or do not include "Nutrition Facts" on the label, you need to measure the servings in order to count the amount of carbohydrates:  Measure the foods that you will eat with a food scale or measuring cup, if needed.   Decide how many standard-size servings you will eat.  Multiply the number of servings by 15. Most carbohydrate-rich foods have about 15 g of carbohydrates per serving. ? For example, if you eat 8 oz (170 g) of strawberries, you will have eaten 2 servings and 30 g of carbohydrates (2 servings x 15 g = 30 g).  For foods that have more than one food mixed, such as soups and casseroles, you must count the carbohydrates in each food that is included. The following list contains standard serving sizes of common carbohydrate-rich foods. Each of these servings has about 15 g of carbohydrates:   hamburger bun or  English muffin.   oz (15 mL) syrup.   oz (14 g) jelly.  1 slice of bread.  1 six-inch tortilla.  3 oz (85 g) cooked rice or pasta.  4 oz (113 g) cooked dried beans.  4 oz (113 g) starchy vegetable, such as peas, corn, or potatoes.  4 oz (113 g) hot cereal.  4 oz (113 g) mashed potatoes or  of a large baked potato.  4 oz (113 g) canned or frozen fruit.  4 oz (120 mL) fruit juice.  4-6 crackers.  6 chicken nuggets.  6 oz (170 g) unsweetened dry cereal.  6 oz (170 g) plain fat-free yogurt or yogurt sweetened with artificial sweeteners.  8 oz (240 mL) milk.  8 oz (170 g) fresh fruit or one small piece of fruit.  24 oz (680 g) popped popcorn. Example of carbohydrate counting Sample meal  3 oz (85 g) chicken breast.  6 oz (170 g)   brown rice.  4 oz (113 g) corn.  8 oz (240 mL) milk.  8 oz (170 g) strawberries with sugar-free whipped topping. Carbohydrate calculation 1. Identify the foods that contain carbohydrates: ? Rice. ? Corn. ? Milk. ? Strawberries. 2. Calculate how many servings you have of each food: ? 2 servings rice. ? 1 serving corn. ? 1 serving milk. ? 1 serving strawberries. 3. Multiply each number of servings by 15 g: ? 2 servings rice x 15 g = 30 g. ? 1 serving corn x 15 g = 15 g. ? 1 serving milk x 15 g = 15 g. ? 1 serving  strawberries x 15 g = 15 g. 4. Add together all of the amounts to find the total grams of carbohydrates eaten: ? 30 g + 15 g + 15 g + 15 g = 75 g of carbohydrates total. Summary  Carbohydrate counting is a method of keeping track of how many carbohydrates you eat.  Eating carbohydrates naturally increases the amount of sugar (glucose) in the blood.  Counting how many carbohydrates you eat helps keep your blood glucose within normal limits, which helps you manage your diabetes.  A diet and nutrition specialist (registered dietitian) can help you make a meal plan and calculate how many carbohydrates you should have at each meal and snack. This information is not intended to replace advice given to you by your health care provider. Make sure you discuss any questions you have with your health care provider. Document Released: 08/21/2005 Document Revised: 02/28/2017 Document Reviewed: 02/02/2016 Elsevier Interactive Patient Education  2019 Elsevier Inc.  

## 2018-11-07 LAB — PAIN MGMT, PROFILE 8 W/CONF, U
6 Acetylmorphine: NEGATIVE ng/mL (ref <10)
Alcohol Metabolites: POSITIVE ng/mL — AB (ref <500)
Alphahydroxyalprazolam: 27 ng/mL — ABNORMAL HIGH (ref <25)
Alphahydroxymidazolam: NEGATIVE ng/mL (ref <50)
Alphahydroxytriazolam: NEGATIVE ng/mL (ref <50)
Aminoclonazepam: NEGATIVE ng/mL (ref <25)
Amphetamines: NEGATIVE ng/mL (ref <500)
Benzodiazepines: POSITIVE ng/mL — AB (ref <100)
Buprenorphine, Urine: NEGATIVE ng/mL (ref <5)
Cocaine Metabolite: NEGATIVE ng/mL (ref <150)
Creatinine: 147.5 mg/dL
Ethyl Glucuronide (ETG): NEGATIVE ng/mL (ref <500)
Ethyl Sulfate (ETS): 404 ng/mL — ABNORMAL HIGH (ref <100)
Hydroxyethylflurazepam: NEGATIVE ng/mL (ref <50)
Lorazepam: NEGATIVE ng/mL (ref <50)
MDMA: NEGATIVE ng/mL (ref <500)
Marijuana Metabolite: NEGATIVE ng/mL (ref <20)
Nordiazepam: NEGATIVE ng/mL (ref <50)
Opiates: NEGATIVE ng/mL (ref <100)
Oxazepam: NEGATIVE ng/mL (ref <50)
Oxidant: NEGATIVE ug/mL (ref <200)
Oxycodone: NEGATIVE ng/mL (ref <100)
Temazepam: NEGATIVE ng/mL (ref <50)
pH: 5.89 (ref 4.5–9.0)

## 2018-11-11 LAB — VITAMIN D 1,25 DIHYDROXY
Vitamin D 1, 25 (OH)2 Total: 44 pg/mL (ref 18–72)
Vitamin D2 1, 25 (OH)2: 12 pg/mL
Vitamin D3 1, 25 (OH)2: 32 pg/mL

## 2018-11-13 ENCOUNTER — Other Ambulatory Visit: Payer: Self-pay | Admitting: Neurology

## 2018-12-08 ENCOUNTER — Other Ambulatory Visit: Payer: Self-pay | Admitting: Family Medicine

## 2018-12-16 NOTE — Progress Notes (Signed)
Virtual Visit via Video Note (had to convert to phone as pt could not figure out the video connection) The purpose of this virtual visit is to provide medical care while limiting exposure to the novel coronavirus.    Consent was obtained for video and then phone visit:  Yes.   Answered questions that patient had about telehealth interaction:  Yes.   I discussed the limitations, risks, security and privacy concerns of performing an evaluation and management service by telemedicine/telephone. I also discussed with the patient that there may be a patient responsible charge related to this service. The patient expressed understanding and agreed to proceed.  Pt location: Home Physician Location: office Name of referring provider:  Mosie Lukes, MD I connected with Kristin [name] at patients initiation/request on 12/17/2018 at  2:00 PM EDT by phone and verified that I am speaking with the correct person using two identifiers. Pt MRN:  [id number] Pt DOB:  1951/06/23 Video Participants:  Kristin [name];     History of Present Illness:  Patient is seen today in follow-up for Parkinson's disease.  She is on carbidopa/levodopa 25/100, 2 tablets in the morning, 2 in the afternoon and 1 in the evening.  She is on carbidopa/levodopa 50/200 at bedtime.   On pramipexole, now on 0.5 mg tid (reduced due to compulsive behaviors).  No SE with that medication and feels that no compulsive behaviors like she was.  Pt denies falls.  Pt denies lightheadedness, near syncope.  No hallucinations.  Mood has been good.  Prior medical records have been reviewed.  She continues to follow with rheumatology for rheumatoid arthritis, but is seeking a second opinion at [facility].  Walking with ski poles for exercise and trying to do exercise at home but hates that PD exercises are closed down during the pandemic.  She went to [place] PD educational program a few months ago and felt that it was depressing.   Observations/Objective:    There were no vitals filed for this visit. Unable -had to convert to phone visit  Assessment and Plan:   1. idiopathic Parkinson's disease.  The patient has tremor, bradykinesia, rigidity and mild postural instability.             -better with pramipexole, 0.5 mg tid (some compulsive behaviors on higher dosages).  Will continue with that             -continue carbidopa/levodopa 25/100 2/2/1. doing well with this.             -She will continue Carbidopa/levodopa 50/200 at night.  This has definitely helped restless leg and has helped most of the nighttime cramping of the feet and legs.  -Discussed in detail Covid-19 and risk factors for this, including age and PD.  Discussed importance of social distancing.  Discussed importance of staying home at all times, as is feasible.  Discussed taking advantage of grocery store hours for the elderly.  Pt expressed understanding.  -will send my chart message with exercise ideas and talked about doing exercise as able during pandemic 2.  Depression and anxiety             - She is on a combination of Lexapro and Cymbalta             -She is now seeing a counselor 3.  Dizziness             -improved 4.  Dysphagia             -  last MBE 12/2015.  Liquids becoming slightly more problematic.  Talked about chin tuck maneuver.  Use straw.   5.  Rheumatoid arthritis             -currently off of her MTX.  Following with Dr. Marijean Bravo but has appt with [facility] for 2nd opinion in July  Follow Up Instructions:    -I discussed the assessment and treatment plan with the patient. The patient was provided an opportunity to ask questions and all were answered. The patient agreed with the plan and demonstrated an understanding of the instructions.   The patient was advised to call back or seek an in-person evaluation if the symptoms worsen or if the condition fails to improve as anticipated.    Total Time spent in visit with the patient was:  28 min, of which more than  50% of the time was spent in counseling and/or coordinating care on safety in PD.   Pt understands and agrees with the plan of care outlined.     Alonza Bogus, DO

## 2018-12-17 ENCOUNTER — Telehealth (INDEPENDENT_AMBULATORY_CARE_PROVIDER_SITE_OTHER): Payer: Medicare Other | Admitting: Neurology

## 2018-12-17 ENCOUNTER — Other Ambulatory Visit: Payer: Self-pay

## 2018-12-17 DIAGNOSIS — G20 Parkinson's disease: Secondary | ICD-10-CM

## 2018-12-21 ENCOUNTER — Other Ambulatory Visit: Payer: Self-pay | Admitting: Family Medicine

## 2018-12-25 DIAGNOSIS — Z6841 Body Mass Index (BMI) 40.0 and over, adult: Secondary | ICD-10-CM | POA: Diagnosis not present

## 2018-12-25 DIAGNOSIS — M2550 Pain in unspecified joint: Secondary | ICD-10-CM | POA: Diagnosis not present

## 2018-12-25 DIAGNOSIS — Z8261 Family history of arthritis: Secondary | ICD-10-CM | POA: Diagnosis not present

## 2018-12-25 DIAGNOSIS — F418 Other specified anxiety disorders: Secondary | ICD-10-CM | POA: Diagnosis not present

## 2018-12-25 DIAGNOSIS — G20 Parkinson's disease: Secondary | ICD-10-CM | POA: Diagnosis not present

## 2018-12-25 DIAGNOSIS — R768 Other specified abnormal immunological findings in serum: Secondary | ICD-10-CM | POA: Diagnosis not present

## 2018-12-25 DIAGNOSIS — Z7189 Other specified counseling: Secondary | ICD-10-CM | POA: Diagnosis not present

## 2018-12-25 DIAGNOSIS — M150 Primary generalized (osteo)arthritis: Secondary | ICD-10-CM | POA: Diagnosis not present

## 2018-12-25 DIAGNOSIS — M7989 Other specified soft tissue disorders: Secondary | ICD-10-CM | POA: Diagnosis not present

## 2018-12-25 DIAGNOSIS — R5383 Other fatigue: Secondary | ICD-10-CM | POA: Diagnosis not present

## 2018-12-25 DIAGNOSIS — M0579 Rheumatoid arthritis with rheumatoid factor of multiple sites without organ or systems involvement: Secondary | ICD-10-CM | POA: Diagnosis not present

## 2018-12-31 ENCOUNTER — Ambulatory Visit: Payer: Medicare Other | Admitting: Neurology

## 2019-01-22 DIAGNOSIS — Z79899 Other long term (current) drug therapy: Secondary | ICD-10-CM | POA: Diagnosis not present

## 2019-01-22 DIAGNOSIS — M0579 Rheumatoid arthritis with rheumatoid factor of multiple sites without organ or systems involvement: Secondary | ICD-10-CM | POA: Diagnosis not present

## 2019-01-25 ENCOUNTER — Other Ambulatory Visit: Payer: Self-pay | Admitting: Neurology

## 2019-01-28 NOTE — Telephone Encounter (Signed)
Requested Prescriptions   Pending Prescriptions Disp Refills  . pramipexole (MIRAPEX) 0.5 MG tablet [Pharmacy Med Name: PRAMIPEXOLE 0.5 MG TABLET] 360 tablet 1    Sig: TAKE 2 TABLETS BY MOUTH IN THE MORNING, 1 TABLET IN THE AFTERNOON, AND 1 TABLET IN THE EVENING   Rx last filled:11/05/18#360 1 refill  Pt last seen:12/17/18 Assessment and Plan:   1. idiopathic Parkinson's disease. The patient has tremor, bradykinesia, rigidity and mild postural instability. -better with pramipexole, 0.5 mg tid (some compulsive behaviors on higher dosages).  Will continue with that -continuecarbidopa/levodopa 25/100 2/2/1.doing well with this. -She will continue Carbidopa/levodopa 50/200 at night. This has definitely helped restless leg and has helped most of the nighttime cramping of the feet and legs.             -Discussed in detail Covid-19 and risk factors for this, including age and PD.  Discussed importance of social distancing.  Discussed importance of staying home at all times, as is feasible.  Discussed taking advantage of grocery store hours for the elderly.  Pt expressed understanding.             -will send my chart message with exercise ideas and talked about doing exercise as able during pandemic 2. Depression and anxiety   Follow up appt scheduled:05/05/19

## 2019-01-28 NOTE — Telephone Encounter (Signed)
Provider approved 

## 2019-02-10 ENCOUNTER — Other Ambulatory Visit: Payer: Self-pay | Admitting: Family Medicine

## 2019-02-11 ENCOUNTER — Other Ambulatory Visit: Payer: Self-pay | Admitting: Family Medicine

## 2019-02-17 DIAGNOSIS — M25562 Pain in left knee: Secondary | ICD-10-CM | POA: Diagnosis not present

## 2019-02-18 DIAGNOSIS — R350 Frequency of micturition: Secondary | ICD-10-CM | POA: Diagnosis not present

## 2019-02-19 DIAGNOSIS — Z8261 Family history of arthritis: Secondary | ICD-10-CM | POA: Diagnosis not present

## 2019-02-19 DIAGNOSIS — F418 Other specified anxiety disorders: Secondary | ICD-10-CM | POA: Diagnosis not present

## 2019-02-19 DIAGNOSIS — M150 Primary generalized (osteo)arthritis: Secondary | ICD-10-CM | POA: Diagnosis not present

## 2019-02-19 DIAGNOSIS — G20 Parkinson's disease: Secondary | ICD-10-CM | POA: Diagnosis not present

## 2019-02-19 DIAGNOSIS — R768 Other specified abnormal immunological findings in serum: Secondary | ICD-10-CM | POA: Diagnosis not present

## 2019-02-19 DIAGNOSIS — R5383 Other fatigue: Secondary | ICD-10-CM | POA: Diagnosis not present

## 2019-02-19 DIAGNOSIS — M7989 Other specified soft tissue disorders: Secondary | ICD-10-CM | POA: Diagnosis not present

## 2019-02-19 DIAGNOSIS — M0579 Rheumatoid arthritis with rheumatoid factor of multiple sites without organ or systems involvement: Secondary | ICD-10-CM | POA: Diagnosis not present

## 2019-02-19 DIAGNOSIS — Z6841 Body Mass Index (BMI) 40.0 and over, adult: Secondary | ICD-10-CM | POA: Diagnosis not present

## 2019-02-19 DIAGNOSIS — Z7189 Other specified counseling: Secondary | ICD-10-CM | POA: Diagnosis not present

## 2019-02-19 DIAGNOSIS — M2550 Pain in unspecified joint: Secondary | ICD-10-CM | POA: Diagnosis not present

## 2019-03-10 ENCOUNTER — Telehealth: Payer: Self-pay | Admitting: Family Medicine

## 2019-03-10 ENCOUNTER — Ambulatory Visit (INDEPENDENT_AMBULATORY_CARE_PROVIDER_SITE_OTHER): Payer: Medicare Other | Admitting: Family Medicine

## 2019-03-10 ENCOUNTER — Other Ambulatory Visit: Payer: Self-pay

## 2019-03-10 ENCOUNTER — Encounter: Payer: Self-pay | Admitting: Family Medicine

## 2019-03-10 DIAGNOSIS — E559 Vitamin D deficiency, unspecified: Secondary | ICD-10-CM | POA: Diagnosis not present

## 2019-03-10 DIAGNOSIS — K5909 Other constipation: Secondary | ICD-10-CM

## 2019-03-10 DIAGNOSIS — R7303 Prediabetes: Secondary | ICD-10-CM | POA: Diagnosis not present

## 2019-03-10 DIAGNOSIS — F418 Other specified anxiety disorders: Secondary | ICD-10-CM | POA: Diagnosis not present

## 2019-03-10 DIAGNOSIS — M069 Rheumatoid arthritis, unspecified: Secondary | ICD-10-CM | POA: Diagnosis not present

## 2019-03-10 DIAGNOSIS — E785 Hyperlipidemia, unspecified: Secondary | ICD-10-CM | POA: Diagnosis not present

## 2019-03-10 DIAGNOSIS — I10 Essential (primary) hypertension: Secondary | ICD-10-CM

## 2019-03-10 MED ORDER — DULOXETINE HCL 30 MG PO CPEP: ORAL_CAPSULE | ORAL | 1 refills | Status: DC

## 2019-03-10 MED ORDER — LOSARTAN POTASSIUM 100 MG PO TABS: 100.0000 mg | ORAL_TABLET | Freq: Every day | ORAL | 1 refills | Status: DC

## 2019-03-10 NOTE — Assessment & Plan Note (Signed)
Supplement and monitor 

## 2019-03-10 NOTE — Assessment & Plan Note (Signed)
Encouraged to check vitals weekly no changes to meds. Encouraged heart healthy diet such as the DASH diet and exercise as tolerated.  

## 2019-03-10 NOTE — Patient Instructions (Signed)
Encouraged increased hydration and fiber in diet. Daily probiotics. If bowels not moving can use MOM 2 tbls po in 4 oz of warm prune juice by mouth every 2-3 days. If no results then repeat in 4 hours with  Dulcolax suppository pr, may repeat again in 4 more hours as needed. Seek care if symptoms worsen. Consider daily Miralax and/or Dulcolax if symptoms persist.   Consider Miralax with Benefiber once or twice a day Constipation, Adult Constipation is when a person has fewer bowel movements in a week than normal, has difficulty having a bowel movement, or has stools that are dry, hard, or larger than normal. Constipation may be caused by an underlying condition. It may become worse with age if a person takes certain medicines and does not take in enough fluids. Follow these instructions at home: Eating and drinking   Eat foods that have a lot of fiber, such as fresh fruits and vegetables, whole grains, and beans.  Limit foods that are high in fat, low in fiber, or overly processed, such as french fries, hamburgers, cookies, candies, and soda.  Drink enough fluid to keep your urine clear or pale yellow. General instructions  Exercise regularly or as told by your health care provider.  Go to the restroom when you have the urge to go. Do not hold it in.  Take over-the-counter and prescription medicines only as told by your health care provider. These include any fiber supplements.  Practice pelvic floor retraining exercises, such as deep breathing while relaxing the lower abdomen and pelvic floor relaxation during bowel movements.  Watch your condition for any changes.  Keep all follow-up visits as told by your health care provider. This is important. Contact a health care provider if:  You have pain that gets worse.  You have a fever.  You do not have a bowel movement after 4 days.  You vomit.  You are not hungry.  You lose weight.  You are bleeding from the anus.  You have  thin, pencil-like stools. Get help right away if:  You have a fever and your symptoms suddenly get worse.  You leak stool or have blood in your stool.  Your abdomen is bloated.  You have severe pain in your abdomen.  You feel dizzy or you faint. This information is not intended to replace advice given to you by your health care provider. Make sure you discuss any questions you have with your health care provider. Document Released: 05/19/2004 Document Revised: 08/03/2017 Document Reviewed: 02/09/2016 Elsevier Patient Education  2020 [employer].

## 2019-03-10 NOTE — Assessment & Plan Note (Signed)
Encouraged increased hydration and fiber in diet. Daily probiotics. If bowels not moving can use MOM 2 tbls po in 4 oz of warm prune juice by mouth every 2-3 days. If no results then repeat in 4 hours with  Dulcolax suppository pr, may repeat again in 4 more hours as needed. Seek care if symptoms worsen. Consider daily Miralax and/or Dulcolax if symptoms persist. Miralax and benefiber

## 2019-03-10 NOTE — Assessment & Plan Note (Signed)
hgba1c acceptable, minimize simple carbs. Increase exercise as tolerated.  

## 2019-03-10 NOTE — Assessment & Plan Note (Signed)
MTX is now 0.8 ml IM weekly and folic Acid tid and she feels better and less tired

## 2019-03-10 NOTE — Telephone Encounter (Signed)
LVM to schedule Return in about 9 weeks (around 05/12/2019) appointment.

## 2019-03-10 NOTE — Assessment & Plan Note (Signed)
Encouraged heart healthy diet, increase exercise, avoid trans fats, consider a krill oil cap daily 

## 2019-03-10 NOTE — Assessment & Plan Note (Signed)
Doing well. No recent flares. No changes

## 2019-03-10 NOTE — Progress Notes (Signed)
Virtual Visit via Video Note  I connected with Kristin [name] on 03/10/19 at  9:20 AM EDT by a video enabled telemedicine application and verified that I am speaking with the correct person using two identifiers.  Location: Patient: home Provider: office   I discussed the limitations of evaluation and management by telemedicine and the availability of in person appointments. The patient expressed understanding and agreed to proceed. [name] CMA was able to get patient set up on video visit.     Subjective:    Patient ID: Kristin [name], female    DOB: 04-10-1951, 68 y.o.   MRN: [id number]  No chief complaint on file.   HPI Patient is in today for follow up on chronic medical concerns including hyperlipidemia, hypertension, rheumatoid arthritis, depression and more. She feels well. They recently changed her MTX dose and that has been helpful. Her fatigue is better and she denies any recent febrile illness or hospitalizations. Denies CP/palp/SOB/HA/congestion/fevers/GI or GU c/o. Taking meds as prescribed  Past Medical History:  Diagnosis Date   Abnormal vaginal Pap smear    Acute pharyngitis 02/25/2013   Anemia    Anxiety    Arthritis    BCC (basal cell carcinoma of skin) 10/05/2014   On back   Chicken pox as a child   Chronic UTI    sees dr Terance Hart   Constipation 12/07/2015   Depression with anxiety 11/02/2009   Qualifier: Diagnosis of  By: [name], LPN, Bonnye M    Dermatitis 07/17/2012   Esophageal stricture 1994   Fibroids    Foot pain, bilateral 06/19/2012   GERD (gastroesophageal reflux disease)    GERD (gastroesophageal reflux disease)    Hiatal hernia    Hyperglycemia 08/19/2013   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   Hyperlipidemia    Hypertension    Infertility, female    Low back pain 10/17/2007   Qualifier: Diagnosis of  By: [name], [name]    Measles as a child   Obesity    Osteoarthritis    Parkinson disease ([place])     Plantar fasciitis of left foot 06/19/2012   Preventative health care 12/19/2015   Rheumatoid arthritis ([place]) 01/08/2018   Rosacea 10/05/2014   Swallowing difficulty    Urinary frequency 02/25/2013   Visual floaters 05/11/2014    Past Surgical History:  Procedure Laterality Date   ABDOMINAL HYSTERECTOMY  2006   total   esophageal     stretching   laporoscopy     PARTIAL HIP ARTHROPLASTY  2010   right   TONSILLECTOMY     wisdom teeth extracted      Family History  Problem Relation Age of Onset   Cancer Mother        breast   Other Mother        arrythmia   Mental illness Mother        bipolar   Hyperlipidemia Mother    Thyroid disease Mother    Depression Mother    Bipolar disorder Mother    Heart disease Father    Arthritis Father        rheumatoid   Hypertension Father    Hyperlipidemia Father    Depression Sister    Mental illness Sister        bipolar   Parkinson's disease Sister    Arthritis Sister    Arthritis Sister    Arthritis Sister    Colon cancer Neg Hx    Esophageal cancer Neg Hx  Rectal cancer Neg Hx    Stomach cancer Neg Hx     Social History   Socioeconomic History   Marital status: Married    Spouse name: Not on file   Number of children: Not on file   Years of education: Not on file   Highest education level: Not on file  Occupational History   Occupation: Retired Teacher, music  Social Designer, fashion/clothing strain: Not on file   Food insecurity    Worry: Not on file    Inability: Not on file   Transportation needs    Medical: Not on file    Non-medical: Not on file  Tobacco Use   Smoking status: Never Smoker   Smokeless tobacco: Never Used  Substance and Sexual Activity   Alcohol use: Yes    Alcohol/week: 4.0 standard drinks    Types: 4 Glasses of wine per week    Comment: occasionally   Drug use: No   Sexual activity: Not Currently    Partners: Male  Lifestyle   Physical  activity    Days per week: Not on file    Minutes per session: Not on file   Stress: Not on file  Relationships   Social connections    Talks on phone: Not on file    Gets together: Not on file    Attends religious service: Not on file    Active member of club or organization: Not on file    Attends meetings of clubs or organizations: Not on file    Relationship status: Not on file   Intimate partner violence    Fear of current or ex partner: Not on file    Emotionally abused: Not on file    Physically abused: Not on file    Forced sexual activity: Not on file  Other Topics Concern   Not on file  Social History Narrative   Lives with husband, no major dietary restrictions, retired from teaching      Husband dx with cancer MDS June 2017.   Currently in remission. (11/03/16 pc)       Outpatient Medications Prior to Visit  Medication Sig Dispense Refill   ALPRAZolam (XANAX) 0.25 MG tablet TAKE 1/2 TO 1 TABLET TWICE A DAY AS NEEDED FOR ANXIETY 30 tablet 1   B-D TB SYRINGE 1CC/27GX1/2" 27G X 1/2" 1 ML MISC      carbidopa-levodopa (SINEMET CR) 50-200 MG tablet TAKE 1 TABLET BY MOUTH EVERYDAY AT BEDTIME 90 tablet 1   carbidopa-levodopa (SINEMET IR) 25-100 MG tablet TAKE 2 TABLETS EVERY MORNING, 2 TABLETS IN THE AFTERNOON, AND 1 TABLET IN THE EVENING 450 tablet 1   escitalopram (LEXAPRO) 20 MG tablet TAKE 1 TABLET BY MOUTH EVERY DAY 90 tablet 2   folic acid (FOLVITE) 1 MG tablet      Methotrexate Sodium (METHOTREXATE, PF,) 50 MG/2ML injection      omeprazole (PRILOSEC) 20 MG capsule TAKE 1 CAPSULE BY MOUTH EVERY DAY 90 capsule 1   polyethylene glycol powder (GLYCOLAX/MIRALAX) powder Take 17 g by mouth 2 (two) times daily as needed (1 to 2 times per day, as needed). 850 g 0   pramipexole (MIRAPEX) 0.5 MG tablet Take 1 tablet (0.5 mg total) by mouth 3 (three) times daily. 270 tablet 1   rosuvastatin (CRESTOR) 10 MG tablet TAKE 1 TABLET BY MOUTH ON MONDAYS, WEDNESDAYS AND  FRIDAYS 54 tablet 1   DULoxetine (CYMBALTA) 30 MG capsule TAKE 1 CAPSULE BY MOUTH EVERY DAY  90 capsule 0   losartan (COZAAR) 100 MG tablet TAKE 1 TABLET BY MOUTH EVERY DAY 90 tablet 0   No facility-administered medications prior to visit.     Allergies  Allergen Reactions   Bactrim [Sulfamethoxazole-Trimethoprim] Other (See Comments)    Oral ulcers and rash   Penicillins Hives    Review of Systems  Constitutional: Positive for malaise/fatigue. Negative for fever.  HENT: Negative for congestion.   Eyes: Negative for blurred vision.  Respiratory: Negative for shortness of breath.   Cardiovascular: Negative for chest pain, palpitations and leg swelling.  Gastrointestinal: Negative for abdominal pain, blood in stool and nausea.  Genitourinary: Negative for dysuria and frequency.  Musculoskeletal: Negative for falls.  Skin: Negative for rash.  Neurological: Negative for dizziness, loss of consciousness and headaches.  Endo/Heme/Allergies: Negative for environmental allergies.  Psychiatric/Behavioral: Negative for depression. The patient is not nervous/anxious.        Objective:    Physical Exam Constitutional:      Appearance: Normal appearance. She is not ill-appearing.  HENT:     Head: Normocephalic and atraumatic.     Nose: Nose normal.  Pulmonary:     Effort: Pulmonary effort is normal.  Neurological:     Mental Status: She is alert and oriented to person, place, and time.  Psychiatric:        Mood and Affect: Mood normal.        Behavior: Behavior normal.     There were no vitals taken for this visit. Wt Readings from Last 3 Encounters:  11/05/18 247 lb (112 kg)  10/17/18 243 lb (110.2 kg)  08/15/18 244 lb (110.7 kg)    Diabetic Foot Exam - Simple   No data filed     Lab Results  Component Value Date   WBC 5.4 11/05/2018   HGB 12.0 11/05/2018   HCT 37.6 11/05/2018   PLT 237.0 11/05/2018   GLUCOSE 95 11/05/2018   CHOL 163 11/05/2018   TRIG 87.0  11/05/2018   HDL 64.80 11/05/2018   LDLDIRECT 161.5 10/07/2008   LDLCALC 81 11/05/2018   ALT 5 11/05/2018   AST 18 11/05/2018   NA 142 11/05/2018   K 4.5 11/05/2018   CL 107 11/05/2018   CREATININE 0.65 11/05/2018   BUN 16 11/05/2018   CO2 26 11/05/2018   TSH 1.38 11/05/2018   INR 2.5 (H) 01/19/2009   HGBA1C 6.1 05/10/2018    Lab Results  Component Value Date   TSH 1.38 11/05/2018   Lab Results  Component Value Date   WBC 5.4 11/05/2018   HGB 12.0 11/05/2018   HCT 37.6 11/05/2018   MCV 84.3 11/05/2018   PLT 237.0 11/05/2018   Lab Results  Component Value Date   NA 142 11/05/2018   K 4.5 11/05/2018   CO2 26 11/05/2018   GLUCOSE 95 11/05/2018   BUN 16 11/05/2018   CREATININE 0.65 11/05/2018   BILITOT 0.5 11/05/2018   ALKPHOS 91 11/05/2018   AST 18 11/05/2018   ALT 5 11/05/2018   PROT 6.4 11/05/2018   ALBUMIN 4.1 11/05/2018   CALCIUM 9.0 11/05/2018   GFR 90.51 11/05/2018   Lab Results  Component Value Date   CHOL 163 11/05/2018   Lab Results  Component Value Date   HDL 64.80 11/05/2018   Lab Results  Component Value Date   LDLCALC 81 11/05/2018   Lab Results  Component Value Date   TRIG 87.0 11/05/2018   Lab Results  Component Value Date  CHOLHDL 3 11/05/2018   Lab Results  Component Value Date   HGBA1C 6.1 05/10/2018       Assessment & Plan:   Problem List Items Addressed This Visit    Hyperlipidemia    Encouraged heart healthy diet, increase exercise, avoid trans fats, consider a krill oil cap daily      Relevant Medications   losartan (COZAAR) 100 MG tablet   Depression with anxiety    Doing well. No recent flares. No changes      Relevant Medications   DULoxetine (CYMBALTA) 30 MG capsule   Essential hypertension    Encouraged to check vitals weekly. no changes to meds. Encouraged heart healthy diet such as the DASH diet and exercise as tolerated.       Relevant Medications   losartan (COZAAR) 100 MG tablet   Constipation      Encouraged increased hydration and fiber in diet. Daily probiotics. If bowels not moving can use MOM 2 tbls po in 4 oz of warm prune juice by mouth every 2-3 days. If no results then repeat in 4 hours with  Dulcolax suppository pr, may repeat again in 4 more hours as needed. Seek care if symptoms worsen. Consider daily Miralax and/or Dulcolax if symptoms persist. Miralax and benefiber      Vitamin D deficiency    Supplement and monitor      Prediabetes    hgba1c acceptable, minimize simple carbs. Increase exercise as tolerated      Rheumatoid arthritis (HCC)    MTX is now 0.8 ml IM weekly and folic Acid tid and she feels better and less tired         I have changed [name]. [employer]'s losartan. I am also having her maintain her polyethylene glycol powder, folic acid, methotrexate (PF), B-D TB SYRINGE 1CC/27GX1/2", ALPRAZolam, carbidopa-levodopa, carbidopa-levodopa, pramipexole, omeprazole, escitalopram, rosuvastatin, and DULoxetine.  Meds ordered this encounter  Medications   DULoxetine (CYMBALTA) 30 MG capsule    Sig: TAKE 1 CAPSULE BY MOUTH EVERY DAY    Dispense:  90 capsule    Refill:  1   losartan (COZAAR) 100 MG tablet    Sig: Take 1 tablet (100 mg total) by mouth daily.    Dispense:  90 tablet    Refill:  1     I discussed the assessment and treatment plan with the patient. The patient was provided an opportunity to ask questions and all were answered. The patient agreed with the plan and demonstrated an understanding of the instructions.   The patient was advised to call back or seek an in-person evaluation if the symptoms worsen or if the condition fails to improve as anticipated.  I provided 25 minutes of non-face-to-face time during this encounter.   [name], MD

## 2019-03-17 DIAGNOSIS — G20 Parkinson's disease: Secondary | ICD-10-CM | POA: Diagnosis not present

## 2019-03-17 DIAGNOSIS — M059 Rheumatoid arthritis with rheumatoid factor, unspecified: Secondary | ICD-10-CM | POA: Diagnosis not present

## 2019-03-17 DIAGNOSIS — Z79899 Other long term (current) drug therapy: Secondary | ICD-10-CM | POA: Diagnosis not present

## 2019-03-17 DIAGNOSIS — R5382 Chronic fatigue, unspecified: Secondary | ICD-10-CM | POA: Diagnosis not present

## 2019-04-02 ENCOUNTER — Encounter: Payer: Self-pay | Admitting: Family Medicine

## 2019-04-03 MED ORDER — LOSARTAN POTASSIUM 50 MG PO TABS: 50.0000 mg | ORAL_TABLET | Freq: Every day | ORAL | 3 refills | Status: DC

## 2019-04-03 MED ORDER — CARVEDILOL 3.125 MG PO TABS: 3.1250 mg | ORAL_TABLET | Freq: Two times a day (BID) | ORAL | 3 refills | Status: DC

## 2019-04-03 NOTE — Telephone Encounter (Signed)
Patient made aware  Sent in new medications

## 2019-04-21 ENCOUNTER — Ambulatory Visit (INDEPENDENT_AMBULATORY_CARE_PROVIDER_SITE_OTHER): Payer: Medicare Other | Admitting: Family Medicine

## 2019-04-21 ENCOUNTER — Encounter: Payer: Self-pay | Admitting: Family Medicine

## 2019-04-21 ENCOUNTER — Telehealth: Payer: Self-pay | Admitting: *Deleted

## 2019-04-21 ENCOUNTER — Other Ambulatory Visit: Payer: Self-pay

## 2019-04-21 DIAGNOSIS — R7303 Prediabetes: Secondary | ICD-10-CM | POA: Diagnosis not present

## 2019-04-21 DIAGNOSIS — R0602 Shortness of breath: Secondary | ICD-10-CM

## 2019-04-21 DIAGNOSIS — K5909 Other constipation: Secondary | ICD-10-CM | POA: Diagnosis not present

## 2019-04-21 DIAGNOSIS — I10 Essential (primary) hypertension: Secondary | ICD-10-CM

## 2019-04-21 DIAGNOSIS — E785 Hyperlipidemia, unspecified: Secondary | ICD-10-CM

## 2019-04-21 DIAGNOSIS — E559 Vitamin D deficiency, unspecified: Secondary | ICD-10-CM

## 2019-04-21 MED ORDER — ALBUTEROL SULFATE HFA 108 (90 BASE) MCG/ACT IN AERS: 2.0000 | INHALATION_SPRAY | Freq: Four times a day (QID) | RESPIRATORY_TRACT | 2 refills | Status: DC | PRN

## 2019-04-21 NOTE — Assessment & Plan Note (Signed)
Encouraged heart healthy diet, increase exercise, avoid trans fats, consider a krill oil cap daily 

## 2019-04-21 NOTE — Assessment & Plan Note (Signed)
hgba1c acceptable, minimize simple carbs. Increase exercise as tolerated.  

## 2019-04-21 NOTE — Assessment & Plan Note (Signed)
Supplement and monitor 

## 2019-04-21 NOTE — Telephone Encounter (Signed)
Patient needs to be scheduled for follow up virtual 6-8 weeks per Dr. Charlett Blake

## 2019-04-22 NOTE — Telephone Encounter (Signed)
appt scheduled

## 2019-04-26 ENCOUNTER — Other Ambulatory Visit: Payer: Self-pay | Admitting: Neurology

## 2019-04-27 NOTE — Progress Notes (Signed)
Virtual Visit via Video Note  I connected with Kristin [name] on 04/21/19 at  2:20 PM EDT by a video enabled telemedicine application and verified that I am speaking with the correct person using two identifiers.  Location: Patient: home Provider: office   I discussed the limitations of evaluation and management by telemedicine and the availability of in person appointments. The patient expressed understanding and agreed to proceed. Magdalene Molly, CMA was able to get the patient set up on a video visit.   Subjective:    Patient ID: Kristin [name], female    DOB: 12/09/1950, 68 y.o.   MRN: [id number]  Chief Complaint  Patient presents with  . Hypertension    HPI Patient is in today for follow up on chronic medical concerns including hypertension, constipation, Parkinson's disease and more. She feels at her baseline today. No recent febrile illness or hospitalizations. She does endorse some SOB with exertion at times. No fevers or significant cough.Denies CP/palp/HA/congestion/fevers/GI or GU c/o. Taking meds as prescribed  Past Medical History:  Diagnosis Date  . Abnormal vaginal Pap smear   . Acute pharyngitis 02/25/2013  . Anemia   . Anxiety   . Arthritis   . 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 Chicken pox as a child  . Chronic UTI    sees dr Terance Hart  . Constipation 12/07/2015  . Depression with anxiety 11/02/2009   Qualifier: Diagnosis of  By: [name], LPN, [name]   . Dermatitis 07/17/2012  . Esophageal stricture 1994  . Fibroids   . Foot pain, bilateral 06/19/2012  . GERD (gastroesophageal reflux disease)   . GERD (gastroesophageal reflux disease)   . Hiatal hernia   . Hyperglycemia 08/19/2013  . 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 Hyperlipidemia   . Hypertension   . Infertility, female   . Low back pain 10/17/2007   Qualifier: Diagnosis of  By: [name], [name]   . Measles as a child  . Obesity   . Osteoarthritis   . Parkinson disease  ([place])   . Plantar fasciitis of left foot 06/19/2012  . Preventative health care 12/19/2015  . Rheumatoid arthritis ([place]) 01/08/2018  . Rosacea 10/05/2014  . Swallowing difficulty   . Urinary frequency 02/25/2013  . Visual floaters 05/11/2014    Past Surgical History:  Procedure Laterality Date  . ABDOMINAL HYSTERECTOMY  2006   total  . esophageal     stretching  . laporoscopy    . PARTIAL HIP ARTHROPLASTY  2010   right  . TONSILLECTOMY    . wisdom teeth extracted      Family History  Problem Relation Age of Onset  . Cancer Mother        breast  . Other Mother        arrythmia  . Mental illness Mother        bipolar  . Hyperlipidemia Mother   . Thyroid disease Mother   . Depression Mother   . Bipolar disorder Mother   . Heart disease Father   . Arthritis Father        rheumatoid  . Hypertension Father   . Hyperlipidemia Father   . Depression Sister   . Mental illness Sister        bipolar  . Parkinson's disease Sister   . Arthritis Sister   . Arthritis Sister   . Arthritis Sister   . Colon cancer Neg Hx   . Esophageal cancer Neg Hx   .  Rectal cancer Neg Hx   . Stomach cancer Neg Hx     Social History   Socioeconomic History  . Marital status: Married    Spouse name: Not on file  . Number of children: Not on file  . Years of education: Not on file  . Highest education level: Not on file  Occupational History  . Occupation: Retired Teacher, music  Social Needs  . Financial resource strain: Not on file  . Food insecurity    Worry: Not on file    Inability: Not on file  . Transportation needs    Medical: Not on file    Non-medical: Not on file  Tobacco Use  . Smoking status: Never Smoker  . Smokeless tobacco: Never Used  Substance and Sexual Activity  . Alcohol use: Yes    Alcohol/week: 4.0 standard drinks    Types: 4 Glasses of wine per week    Comment: occasionally  . Drug use: No  . Sexual activity: Not Currently    Partners: Male  Lifestyle   . Physical activity    Days per week: Not on file    Minutes per session: Not on file  . Stress: Not on file  Relationships  . Social Herbalist on phone: Not on file    Gets together: Not on file    Attends religious service: Not on file    Active member of club or organization: Not on file    Attends meetings of clubs or organizations: Not on file    Relationship status: Not on file  . Intimate partner violence    Fear of current or ex partner: Not on file    Emotionally abused: Not on file    Physically abused: Not on file    Forced sexual activity: Not on file  Other Topics Concern  . Not on file  Social History Narrative   Lives with husband, no major dietary restrictions, retired from teaching      Husband dx with cancer MDS June 2017.   Currently in remission. (11/03/16 pc)       Outpatient Medications Prior to Visit  Medication Sig Dispense Refill  . ALPRAZolam (XANAX) 0.25 MG tablet TAKE 1/2 TO 1 TABLET TWICE A DAY AS NEEDED FOR ANXIETY 30 tablet 1  . B-D TB SYRINGE 1CC/27GX1/2" 27G X 1/2" 1 ML MISC     . carbidopa-levodopa (SINEMET CR) 50-200 MG tablet TAKE 1 TABLET BY MOUTH EVERYDAY AT BEDTIME 90 tablet 1  . carbidopa-levodopa (SINEMET IR) 25-100 MG tablet TAKE 2 TABLETS EVERY MORNING, 2 TABLETS IN THE AFTERNOON, AND 1 TABLET IN THE EVENING 450 tablet 1  . carvedilol (COREG) 3.125 MG tablet Take 1 tablet (3.125 mg total) by mouth 2 (two) times daily with a meal. 60 tablet 3  . DULoxetine (CYMBALTA) 30 MG capsule TAKE 1 CAPSULE BY MOUTH EVERY DAY 90 capsule 1  . escitalopram (LEXAPRO) 20 MG tablet TAKE 1 TABLET BY MOUTH EVERY DAY 90 tablet 2  . folic acid (FOLVITE) 1 MG tablet     . losartan (COZAAR) 50 MG tablet Take 1 tablet (50 mg total) by mouth daily. 90 tablet 3  . Methotrexate Sodium (METHOTREXATE, PF,) 50 MG/2ML injection     . omeprazole (PRILOSEC) 20 MG capsule TAKE 1 CAPSULE BY MOUTH EVERY DAY 90 capsule 1  . polyethylene glycol powder  (GLYCOLAX/MIRALAX) powder Take 17 g by mouth 2 (two) times daily as needed (1 to 2 times per day,  as needed). 850 g 0  . pramipexole (MIRAPEX) 0.5 MG tablet Take 1 tablet (0.5 mg total) by mouth 3 (three) times daily. 270 tablet 1  . rosuvastatin (CRESTOR) 10 MG tablet TAKE 1 TABLET BY MOUTH ON MONDAYS, WEDNESDAYS AND FRIDAYS 54 tablet 1  . hydroxychloroquine (PLAQUENIL) 200 MG tablet Take 2 tablets (400 mg total) by mouth daily.     No facility-administered medications prior to visit.     Allergies  Allergen Reactions  . Bactrim [Sulfamethoxazole-Trimethoprim] Other (See Comments)    Oral ulcers and rash  . Penicillins Hives    Review of Systems  Constitutional: Positive for malaise/fatigue. Negative for fever.  HENT: Negative for congestion.   Eyes: Negative for blurred vision.  Respiratory: Positive for shortness of breath.   Cardiovascular: Negative for chest pain, palpitations and leg swelling.  Gastrointestinal: Positive for constipation. Negative for abdominal pain, blood in stool and nausea.  Genitourinary: Negative for dysuria and frequency.  Musculoskeletal: Positive for joint pain and myalgias. Negative for falls.  Skin: Negative for rash.  Neurological: Negative for dizziness, loss of consciousness and headaches.  Endo/Heme/Allergies: Negative for environmental allergies.  Psychiatric/Behavioral: Positive for depression. The patient is not nervous/anxious.        Objective:    Physical Exam Constitutional:      Appearance: Normal appearance. She is not ill-appearing.  HENT:     Head: Normocephalic and atraumatic.     Nose: Nose normal.  Eyes:     General:        Right eye: No discharge.        Left eye: No discharge.  Pulmonary:     Effort: Pulmonary effort is normal.  Neurological:     Mental Status: She is alert and oriented to person, place, and time.  Psychiatric:        Mood and Affect: Mood normal.        Behavior: Behavior normal.     BP 138/67    Pulse 79  Wt Readings from Last 3 Encounters:  11/05/18 247 lb (112 kg)  10/17/18 243 lb (110.2 kg)  08/15/18 244 lb (110.7 kg)    Diabetic Foot Exam - Simple   No data filed     Lab Results  Component Value Date   WBC 5.4 11/05/2018   HGB 12.0 11/05/2018   HCT 37.6 11/05/2018   PLT 237.0 11/05/2018   GLUCOSE 95 11/05/2018   CHOL 163 11/05/2018   TRIG 87.0 11/05/2018   HDL 64.80 11/05/2018   LDLDIRECT 161.5 10/07/2008   LDLCALC 81 11/05/2018   ALT 5 11/05/2018   AST 18 11/05/2018   NA 142 11/05/2018   K 4.5 11/05/2018   CL 107 11/05/2018   CREATININE 0.65 11/05/2018   BUN 16 11/05/2018   CO2 26 11/05/2018   TSH 1.38 11/05/2018   INR 2.5 (H) 01/19/2009   HGBA1C 6.1 05/10/2018    Lab Results  Component Value Date   TSH 1.38 11/05/2018   Lab Results  Component Value Date   WBC 5.4 11/05/2018   HGB 12.0 11/05/2018   HCT 37.6 11/05/2018   MCV 84.3 11/05/2018   PLT 237.0 11/05/2018   Lab Results  Component Value Date   NA 142 11/05/2018   K 4.5 11/05/2018   CO2 26 11/05/2018   GLUCOSE 95 11/05/2018   BUN 16 11/05/2018   CREATININE 0.65 11/05/2018   BILITOT 0.5 11/05/2018   ALKPHOS 91 11/05/2018   AST 18 11/05/2018   ALT 5  11/05/2018   PROT 6.4 11/05/2018   ALBUMIN 4.1 11/05/2018   CALCIUM 9.0 11/05/2018   GFR 90.51 11/05/2018   Lab Results  Component Value Date   CHOL 163 11/05/2018   Lab Results  Component Value Date   HDL 64.80 11/05/2018   Lab Results  Component Value Date   LDLCALC 81 11/05/2018   Lab Results  Component Value Date   TRIG 87.0 11/05/2018   Lab Results  Component Value Date   CHOLHDL 3 11/05/2018   Lab Results  Component Value Date   HGBA1C 6.1 05/10/2018       Assessment & Plan:   Problem List Items Addressed This Visit    Hyperlipidemia    Encouraged heart healthy diet, increase exercise, avoid trans fats, consider a krill oil cap daily      Essential hypertension    Well controlled, no changes to  meds. Encouraged heart healthy diet such as the DASH diet and exercise as tolerated.       Constipation    Encouraged increased hydration and fiber in diet. Daily probiotics. If bowels not moving can use MOM 2 tbls po in 4 oz of warm prune juice by mouth every 2-3 days. If no results then repeat in 4 hours with  Dulcolax suppository pr, may repeat again in 4 more hours as needed. Seek care if symptoms worsen. Consider daily Miralax and/or Dulcolax if symptoms persist.       Shortness of breath on exertion    Stable but will allow her a prescription for albuterol to try a puff prior to ambulation      Vitamin D deficiency    Supplement and monitor      Prediabetes    hgba1c acceptable, minimize simple carbs. Increase exercise as tolerated         I am having [name]. Adamik start on albuterol. I am also having her maintain her polyethylene glycol powder, folic acid, methotrexate (PF), B-D TB SYRINGE 1CC/27GX1/2", ALPRAZolam, carbidopa-levodopa, carbidopa-levodopa, pramipexole, omeprazole, escitalopram, rosuvastatin, DULoxetine, losartan, carvedilol, and hydroxychloroquine.  Meds ordered this encounter  Medications  . albuterol (VENTOLIN HFA) 108 (90 Base) MCG/ACT inhaler    Sig: Inhale 2 puffs into the lungs every 6 (six) hours as needed for wheezing or shortness of breath.    Dispense:  18 g    Refill:  2     I discussed the assessment and treatment plan with the patient. The patient was provided an opportunity to ask questions and all were answered. The patient agreed with the plan and demonstrated an understanding of the instructions.   The patient was advised to call back or seek an in-person evaluation if the symptoms worsen or if the condition fails to improve as anticipated.  I provided 25 minutes of non-face-to-face time during this encounter.   [name], MD

## 2019-04-27 NOTE — Assessment & Plan Note (Signed)
Stable but will allow her a prescription for albuterol to try a puff prior to ambulation

## 2019-04-27 NOTE — Assessment & Plan Note (Signed)
Encouraged increased hydration and fiber in diet. Daily probiotics. If bowels not moving can use MOM 2 tbls po in 4 oz of warm prune juice by mouth every 2-3 days. If no results then repeat in 4 hours with  Dulcolax suppository pr, may repeat again in 4 more hours as needed. Seek care if symptoms worsen. Consider daily Miralax and/or Dulcolax if symptoms persist.  

## 2019-04-27 NOTE — Assessment & Plan Note (Signed)
Well controlled, no changes to meds. Encouraged heart healthy diet such as the DASH diet and exercise as tolerated.  °

## 2019-04-28 NOTE — Telephone Encounter (Signed)
Requested Prescriptions   Pending Prescriptions Disp Refills  . carbidopa-levodopa (SINEMET CR) 50-200 MG tablet [Pharmacy Med Name: CARBIDOPA-LEVO ER 50-200 TAB] 90 tablet 1    Sig: TAKE 1 TABLET BY MOUTH EVERYDAY AT BEDTIME   Rx last filled:11/13/18 #90  REFILL  Pt last seen: 12/17/18   Follow up appt scheduled:05/05/19  Pt has appt on 05/05/19 provider will address and refill at appt

## 2019-04-29 ENCOUNTER — Telehealth: Payer: Self-pay | Admitting: Occupational Therapy

## 2019-04-29 DIAGNOSIS — G20 Parkinson's disease: Secondary | ICD-10-CM

## 2019-04-29 NOTE — Telephone Encounter (Signed)
Please order

## 2019-04-29 NOTE — Telephone Encounter (Signed)
Dr. Carles Collet,  Kristin [name] is scheduled for  OT, PT re-evaluations on 05/05/19 as recommended when she was last discharged from therapy due to progressive nature of diagnosis.  Pt was in agreement with this plan.  If you are in agreement, please send updated order for OT, PT via epic.  Thank you,   Vianne Bulls, OTR/L [facility] [address] [place], [place]  [phone] phone [phone] 04/29/19 1:16 PM

## 2019-04-30 NOTE — Telephone Encounter (Signed)
ORDER PLACED

## 2019-05-01 NOTE — Progress Notes (Signed)
Kristin [name] was seen today in follow up for Parkinsons disease.  Pt is currently on pramipexole 0.5 mg 3 times per day.  She has no compulsive behaviors on this lower dosage like she did on the higher dose.  She is on carbidopa/levodopa 25/100, 2 tablets in the morning, 2 in the afternoon and 1 in the evening and carbidopa/levodopa 50/200 at bedtime.  Pt had one fall out of bed in a dream.  This was a first for her.   Pt denies lightheadedness, near syncope.  No hallucinations.  Mood has been good on Lexapro and Cymbalta.  She has not been seeing her counselor over the summer, as it is online and she feels that there is no privacy in her house.  Medical records have been reviewed since our last visit.  She had a consult with [facility] rheumatology on March 17, 2019.  I have reviewed those records.   Plaquenil was added.  Since that time, she feels that she has more SOB.  She does state that she saw her PCP and got an inhaler.    BP a bit high today but states that her BP medication was recently adjusted.  She states though that her insurance company was the one that made her change BP meds.  She is going to [facility] and has a trainer one on one.  She did get a stationary bike for home.     PREVIOUS MEDICATIONS:metoprolol, Pristiq  ALLERGIES:   Allergies  Allergen Reactions  . Bactrim [Sulfamethoxazole-Trimethoprim] Other (See Comments)    Oral ulcers and rash  . Penicillins Hives    CURRENT MEDICATIONS:  Outpatient Encounter Medications as of 05/05/2019  Medication Sig  . albuterol (VENTOLIN HFA) 108 (90 Base) MCG/ACT inhaler Inhale 2 puffs into the lungs every 6 (six) hours as needed for wheezing or shortness of breath.  . ALPRAZolam (XANAX) 0.25 MG tablet TAKE 1/2 TO 1 TABLET TWICE A DAY AS NEEDED FOR ANXIETY  . B-D TB SYRINGE 1CC/27GX1/2" 27G X 1/2" 1 ML MISC   . carbidopa-levodopa (SINEMET CR) 50-200 MG tablet TAKE 1 TABLET BY MOUTH EVERYDAY AT BEDTIME  . carbidopa-levodopa (SINEMET IR) 25-100  MG tablet TAKE 2 TABLETS EVERY MORNING, 2 TABLETS IN THE AFTERNOON, AND 1 TABLET IN THE EVENING  . carvedilol (COREG) 3.125 MG tablet Take 1 tablet (3.125 mg total) by mouth 2 (two) times daily with a meal.  . DULoxetine (CYMBALTA) 30 MG capsule TAKE 1 CAPSULE BY MOUTH EVERY DAY  . escitalopram (LEXAPRO) 20 MG tablet TAKE 1 TABLET BY MOUTH EVERY DAY  . folic acid (FOLVITE) 1 MG tablet   . hydroxychloroquine (PLAQUENIL) 200 MG tablet Take 2 tablets (400 mg total) by mouth daily.  Marland Kitchen losartan (COZAAR) 50 MG tablet Take 1 tablet (50 mg total) by mouth daily.  . Methotrexate Sodium (METHOTREXATE, PF,) 50 MG/2ML injection   . omeprazole (PRILOSEC) 20 MG capsule TAKE 1 CAPSULE BY MOUTH EVERY DAY  . polyethylene glycol powder (GLYCOLAX/MIRALAX) powder Take 17 g by mouth 2 (two) times daily as needed (1 to 2 times per day, as needed).  . pramipexole (MIRAPEX) 0.5 MG tablet Take 1 tablet (0.5 mg total) by mouth 3 (three) times daily.  . rosuvastatin (CRESTOR) 10 MG tablet TAKE 1 TABLET BY MOUTH ON MONDAYS, WEDNESDAYS AND FRIDAYS   No facility-administered encounter medications on file as of 05/05/2019.     PAST MEDICAL HISTORY:   Past Medical History:  Diagnosis Date  . Abnormal vaginal  Pap smear   . Acute pharyngitis 02/25/2013  . Anemia   . Anxiety   . Arthritis   . 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 Chicken pox as a child  . Chronic UTI    sees dr Terance Hart  . Constipation 12/07/2015  . Depression with anxiety 11/02/2009   Qualifier: Diagnosis of  By: [name], LPN, [name]   . Dermatitis 07/17/2012  . Esophageal stricture 1994  . Fibroids   . Foot pain, bilateral 06/19/2012  . GERD (gastroesophageal reflux disease)   . GERD (gastroesophageal reflux disease)   . Hiatal hernia   . Hyperglycemia 08/19/2013  . 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 Hyperlipidemia   . Hypertension   . Infertility, female   . Low back pain 10/17/2007   Qualifier: Diagnosis of  By: Arnoldo Morale  MD, [name]   . Measles as a child  . Obesity   . Osteoarthritis   . Parkinson disease ([place])   . Plantar fasciitis of left foot 06/19/2012  . Preventative health care 12/19/2015  . Rheumatoid arthritis ([place]) 01/08/2018  . Rosacea 10/05/2014  . Swallowing difficulty   . Urinary frequency 02/25/2013  . Visual floaters 05/11/2014    PAST SURGICAL HISTORY:   Past Surgical History:  Procedure Laterality Date  . ABDOMINAL HYSTERECTOMY  2006   total  . esophageal     stretching  . laporoscopy    . PARTIAL HIP ARTHROPLASTY  2010   right  . TONSILLECTOMY    . wisdom teeth extracted      SOCIAL HISTORY:   Social History   Socioeconomic History  . Marital status: Married    Spouse name: Not on file  . Number of children: 1  . Years of education: Not on file  . Highest education level: Master's degree (e.g., MA, MS, MEng, MEd, MSW, MBA)  Occupational History  . Occupation: Retired Teacher, music  Social Needs  . Financial resource strain: Not on file  . Food insecurity    Worry: Not on file    Inability: Not on file  . Transportation needs    Medical: Not on file    Non-medical: Not on file  Tobacco Use  . Smoking status: Never Smoker  . Smokeless tobacco: Never Used  Substance and Sexual Activity  . Alcohol use: Yes    Alcohol/week: 4.0 standard drinks    Types: 4 Glasses of wine per week    Comment: occasionally  . Drug use: No  . Sexual activity: Not Currently    Partners: Male  Lifestyle  . Physical activity    Days per week: Not on file    Minutes per session: Not on file  . Stress: Not on file  Relationships  . Social Herbalist on phone: Not on file    Gets together: Not on file    Attends religious service: Not on file    Active member of club or organization: Not on file    Attends meetings of clubs or organizations: Not on file    Relationship status: Not on file  . Intimate partner violence    Fear of current or ex partner: Not on file     Emotionally abused: Not on file    Physically abused: Not on file    Forced sexual activity: Not on file  Other Topics Concern  . Not on file  Social History Narrative   Lives with husband, no major dietary  restrictions, retired from teaching      Husband dx with cancer MDS June 2017.   Currently in remission. (11/03/16 pc)      Pt has one biological child the other 3 are adopted       FAMILY HISTORY:   Family Status  Relation Name Status  . Mother  Deceased at age 42       Dementia, breast cancer, bipolar  . Father  Deceased at age 76       heart failure, rheumatoid arthritis  . Sister Florian Buff       arthritis, PD  . Sister Keturah Shavers  . Daughter Maudie Mercury- adopted Alive       29  . Son Jenny Reichmann- adopted Alive       62  . MGM  Deceased  . MGF  Deceased  . PGM  Deceased  . PGF  Deceased  . Sister Peg Alive        healthy  . Sister Robina Ade       healthy  . Daughter maggie Alive       28/ healthy  . Neg Hx  (Not Specified)    ROS:  Review of Systems  Constitutional: Positive for malaise/fatigue.  HENT: Negative.   Eyes: Negative.   Respiratory: Negative.   Cardiovascular: Negative.   Gastrointestinal: Negative.   Genitourinary: Negative.   Musculoskeletal: Positive for joint pain.  Skin: Negative.   Neurological: Positive for tremors.    PHYSICAL EXAMINATION:    VITALS:   Vitals:   05/05/19 0807  BP: (!) 151/96  Pulse: 71  SpO2: 95%  Weight: 248 lb 12.8 oz (112.9 kg)  Height: 5\' 4"  (1.626 m)   Wt Readings from Last 3 Encounters:  05/05/19 248 lb 12.8 oz (112.9 kg)  11/05/18 247 lb (112 kg)  10/17/18 243 lb (110.2 kg)     GEN:  The patient appears stated age and is in NAD. HEENT:  Normocephalic, atraumatic.  The mucous membranes are moist. The superficial temporal arteries are without ropiness or tenderness. CV:  RRR Lungs:  CTAB Neck/HEME:  There are no carotid bruits bilaterally.  Neurological examination:  Orientation: The patient is alert and  oriented x3. Cranial nerves: There is good facial symmetry without facial hypomimia. The speech is fluent and clear. Soft palate rises symmetrically and there is no tongue deviation. Hearing is intact to conversational tone. Sensation: Sensation is intact to light touch throughout Motor: Strength is at least antigravity x4.  Movement examination: Tone: There is normal tone in the RUE.  There is normal tone in the LUE.  There is normal tone in the RLE.  There is normal tone in the LLE.  Abnormal movements: no tremor today Coordination:  There is no decremation with RAM's, with any form of RAMS, including alternating supination and pronation of the forearm, hand opening and closing, finger taps, heel taps and toe taps. Gait and Station: The patient has no difficulty arising out of a deep-seated chair without the use of the hands. The patient's stride length is normal with good arm swing bilaterally.      ASSESSMENT/PLAN:  1. idiopathic Parkinson's disease. The patient has tremor, bradykinesia, rigidity and mild postural instability. -Continue pramipexole 0.5 mg 3 times per day.  She had some compulsive behaviors on higher dosages (desire to continually clean, almost in an OCD fashion) -continuecarbidopa/levodopa 25/100, 2 tablets in the morning, 2 in the afternoon and 1 in the evening. -She will continue Carbidopa/levodopa 50/200 at  night. This has definitely helped restless leg and has helped most of the nighttime cramping of the feet and legs.  -We discussed that it used to be thought that levodopa would increase risk of melanoma but now it is believed that Parkinsons itself likely increases risk of melanoma. she is to get regular skin checks.  She went in [place] and has a yearly appt.    -will have my social worker call her.   2. Depression and anxiety - She is on a combination of Lexapro and Cymbalta -She was seeing a counselor but  the counselor is only online and the patient wants to have in person options as she needs privacy. 3. Dizziness -improved 4. Dysphagia -last MBE 12/2015. Liquids becoming slightly more problematic. Talked about chin tuck maneuver. Use straw.  5. Rheumatoid arthritis -now on Plaquenil.  -discussed f/u with ophthalmology and she has appt in 2 weeks 6.  SOB  -possibly related to BP med change (which was requested by insurance company) as BP was high today.  Told her to contact PCP.  Also told her to talk with rheumatologist as she thinks that it started when Plaquenil was initiated.    7.  REM behavior disorder  -This is commonly associated with PD and the patient is experiencing this.  She just had a fall out of the bed.   We discussed that this can be very serious and even harmful.  We talked about medications as well as physical barriers to put in the bed (particularly soft bed rails, pillow barriers).  We talked about moving the night stand so that it is not so close to the side of the bed.  She will start with the bed rails.  She doesn't want more meds right now.   8.  Follow up is anticipated in the next 4-6 months, sooner should new neurologic issues arise.  Much greater than 50% of this visit was spent in counseling and coordinating care.  Total face to face time:  25 min.  Cc:  Mosie Lukes, MD

## 2019-05-05 ENCOUNTER — Ambulatory Visit (INDEPENDENT_AMBULATORY_CARE_PROVIDER_SITE_OTHER): Payer: Medicare Other | Admitting: Neurology

## 2019-05-05 ENCOUNTER — Other Ambulatory Visit: Payer: Self-pay

## 2019-05-05 ENCOUNTER — Encounter: Payer: Self-pay | Admitting: Physical Therapy

## 2019-05-05 ENCOUNTER — Ambulatory Visit: Payer: Medicare Other | Admitting: Occupational Therapy

## 2019-05-05 ENCOUNTER — Ambulatory Visit: Payer: Medicare Other | Attending: Neurology | Admitting: Physical Therapy

## 2019-05-05 ENCOUNTER — Encounter: Payer: Self-pay | Admitting: Neurology

## 2019-05-05 VITALS — BP 151/96 | HR 71 | Ht 64.0 in | Wt 248.8 lb

## 2019-05-05 DIAGNOSIS — R2689 Other abnormalities of gait and mobility: Secondary | ICD-10-CM | POA: Insufficient documentation

## 2019-05-05 DIAGNOSIS — R2681 Unsteadiness on feet: Secondary | ICD-10-CM | POA: Diagnosis not present

## 2019-05-05 DIAGNOSIS — R0602 Shortness of breath: Secondary | ICD-10-CM

## 2019-05-05 DIAGNOSIS — R29818 Other symptoms and signs involving the nervous system: Secondary | ICD-10-CM | POA: Insufficient documentation

## 2019-05-05 DIAGNOSIS — G4752 REM sleep behavior disorder: Secondary | ICD-10-CM

## 2019-05-05 DIAGNOSIS — R293 Abnormal posture: Secondary | ICD-10-CM

## 2019-05-05 DIAGNOSIS — G20 Parkinson's disease: Secondary | ICD-10-CM

## 2019-05-05 NOTE — Patient Instructions (Signed)
1.  Look for tuck in bed rails on [place].  Keep your night stand away from your bed 2.  Make an appointment with Dr. Randel Pigg and call your rheumatologist regarding shortness of breath

## 2019-05-05 NOTE — Therapy (Signed)
[facility] [address] [place], [place], [address] Phone: [phone]   Fax:  [phone]  Physical Therapy Evaluation  Patient Details  Name: Kristin [name] MRN: [id number] Date of Birth: [birth year] Referring Provider (PT): Tat, [name]   Encounter Date: 05/05/2019  CLINIC OPERATION CHANGES: [facility] is open at lower capacity following universal masking, social distancing, and patient screening.  The patient's COVID risk of complications score is 4.   PT End of Session - 05/05/19 1224    Visit Number  1    Number of Visits  5    Date for PT Re-Evaluation  07/04/19    Authorization Type  Medicare/[employer]; Covered 100%    PT Start Time  0935    PT Stop Time  1018    PT Time Calculation (min)  43 min    Activity Tolerance  --   Increased SOB with 3 minute walk test   Behavior During Therapy  WFL for tasks assessed/performed       Past Medical History:  Diagnosis Date  . Abnormal vaginal Pap smear   . Acute pharyngitis 02/25/2013  . Anemia   . Anxiety   . Arthritis   . 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 Chicken pox as a child  . Chronic UTI    sees dr Terance Hart  . Constipation 12/07/2015  . Depression with anxiety 11/02/2009   Qualifier: Diagnosis of  By: [name], LPN, [name]   . Dermatitis 07/17/2012  . Esophageal stricture 1994  . Fibroids   . Foot pain, bilateral 06/19/2012  . GERD (gastroesophageal reflux disease)   . GERD (gastroesophageal reflux disease)   . Hiatal hernia   . Hyperglycemia 08/19/2013  . 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 Hyperlipidemia   . Hypertension   . Infertility, female   . Low back pain 10/17/2007   Qualifier: Diagnosis of  By: [name], [name]   . Measles as a child  . Obesity   . Osteoarthritis   . Parkinson disease ([place])   . Plantar fasciitis of left foot 06/19/2012  . Preventative health care 12/19/2015  . Rheumatoid  arthritis ([place]) 01/08/2018  . Rosacea 10/05/2014  . Swallowing difficulty   . Urinary frequency 02/25/2013  . Visual floaters 05/11/2014    Past Surgical History:  Procedure Laterality Date  . ABDOMINAL HYSTERECTOMY  2006   total  . esophageal     stretching  . laporoscopy    . PARTIAL HIP ARTHROPLASTY  2010   right  . TONSILLECTOMY    . wisdom teeth extracted      There were no vitals filed for this visit.   Subjective Assessment - 05/05/19 0941    Subjective  Have had a few changes in my medications.  I'm having some shortness of breath, but I'm going to talk to my doctor.  Feel like I'm moving okay, just slowed.   I try to do exercises, but I don't do my exercises as much at home.  Have been to [facility] and that has helped.  I got a bike and walking with friends.    Pertinent History  RA (knee pain)    Patient Stated Goals  Pt's goals for therapy are to improve strength.    Currently in Pain?  No/denies         [facility] PT Assessment - 05/05/19 0947      Assessment   Medical Diagnosis  Parkinson's disease    Referring Provider (PT)  Tat, [name]    Onset Date/Surgical Date  --   d/c 2019 from PT last bout   Hand Dominance  Left      Precautions   Precautions  Fall      Balance Screen   Has the patient fallen in the past 6 months  Yes    How many times?  1   Rolled out of bed while sleeping    Has the patient had a decrease in activity level because of a fear of falling?   No   Fear is related to pain in knees (espec with steps)   Is the patient reluctant to leave their home because of a fear of falling?   No      Home Film/video editor residence    Living Arrangements  Spouse/significant other    Available Help at Discharge  Family    Type of [place] to enter    Entrance Stairs-Number of Steps  1    Entrance Stairs-Rails  None    Home Layout  Two level;Bed/bath upstairs    Home Equipment  --   bilateral walking poles    Additional Comments  Husband has leukemia (in remission), but patient has to go to appointments with him.      Prior Function   Level of Independence  Independent    Vocation  Retired    Leisure  Since [employer], has begun to work out with [employer] at [employer] and is biking, walking at home      Posture/Postural Control   Posture/Postural Control  Postural limitations    Postural Limitations  Forward head;Rounded Shoulders      ROM / Strength   AROM / PROM / Strength  Strength      Strength   Overall Strength  Within functional limits for tasks performed    Overall Strength Comments  MMT grossly tested at least 4+/5 throughout      Transfers   Transfers  Sit to Stand;Stand to Sit    Sit to Stand  6: Modified independent (Device/Increase time);Without upper extremity assist;From chair/3-in-1    Five time sit to stand comments   11.03    Stand to Sit  6: Modified independent (Device/Increase time);Without upper extremity assist;To chair/3-in-1      Ambulation/Gait   Ambulation/Gait  Yes    Ambulation/Gait Assistance  6: Modified independent (Device/Increase time)    Ambulation Distance (Feet)  530 Feet    Assistive device  None    Gait Pattern  Step-through pattern;Decreased arm swing - right;Decreased stance time - right;Decreased trunk rotation    Ambulation Surface  Level;Indoor    Gait velocity  9.31 sec = 3.52 ft/sec    Gait Comments  3 minute walk test:  209 ft (after 2 standing breaks); had to stop due to pt's c/o shortness of breath. (O2 sats 95% and HR 105)      Standardized Balance Assessment   Standardized Balance Assessment  Timed Up and Go Test      Timed Up and Go Test   Normal TUG (seconds)  11.35    Manual TUG (seconds)  10.9    Cognitive TUG (seconds)  11.75    TUG Comments  Scores >13.5-15 seconds indicate increased fall risk.      Functional Gait  Assessment   Gait assessed   Yes  Gait Level Surface  Walks 20 ft in less than 7 sec but greater than 5.5 sec,  uses assistive device, slower speed, mild gait deviations, or deviates 6-10 in outside of the 12 in walkway width.   6.1   Change in Gait Speed  Able to smoothly change walking speed without loss of balance or gait deviation. Deviate no more than 6 in outside of the 12 in walkway width.    Gait with Horizontal Head Turns  Performs head turns smoothly with slight change in gait velocity (eg, minor disruption to smooth gait path), deviates 6-10 in outside 12 in walkway width, or uses an assistive device.    Gait with Vertical Head Turns  Performs head turns with no change in gait. Deviates no more than 6 in outside 12 in walkway width.    Gait and Pivot Turn  Pivot turns safely within 3 sec and stops quickly with no loss of balance.    Step Over Obstacle  Is able to step over one shoe box (4.5 in total height) without changing gait speed. No evidence of imbalance.    Gait with Narrow Base of Support  Ambulates 7-9 steps.    Gait with Eyes Closed  Walks 20 ft, slow speed, abnormal gait pattern, evidence for imbalance, deviates 10-15 in outside 12 in walkway width. Requires more than 9 sec to ambulate 20 ft.   10.5   Ambulating Backwards  Walks 20 ft, no assistive devices, good speed, no evidence for imbalance, normal gait    Steps  Alternating feet, must use rail.    Total Score  23    FGA comment:  Scores <22/30 indicate increased fall risk.                Objective measurements completed on examination: See above findings.                   PT Long Term Goals - 05/05/19 1246      PT LONG TERM GOAL #1   Title  Pt will be independent with HEP for improved functional mobility, strength, balance, PD-specific deficits.  TARGET 06/06/2019 (may be delayed due to delayed start coordinating PT, OT)    Time  4    Period  Weeks    Status  New    Target Date  06/06/19      PT LONG TERM GOAL #2   Title  Pt will improve 5x sit<>stand to less than or equal to 10 seconds for  improved functional strength.    Time  4    Period  Weeks    Status  New    Target Date  06/06/19      PT LONG TERM GOAL #3   Title  Pt will improve 3 MWT distance to at least 575 ft for improved gait efficiency and safety for community walking.    Time  4    Period  Weeks    Status  New    Target Date  06/06/19      PT LONG TERM GOAL #4   Title  Pt will verbalize understanding of appropriate ongoing community fitness upon d/c from PT.    Time  4    Period  Weeks    Status  New    Target Date  06/06/19             Plan - 05/05/19 1228    Clinical Impression Statement  Pt is a 68 year old  female who presents to OPPT with history of Parkinson's disease, who returns as follow-up eval from last bout of PT (d/c'd in 07/2018).  Pt has had one fall out of her bed while sleeping; she has had difficulty with knee pain (due to RA) and shortness of breath (she believes due to recent changes in medications).  She reports she feels that she has had slower overall mobility, as she is not exercising at the intensity she was pre-COVID.  She presents to OPPT with abnormal posture, decreased timing/coordination of gait, decreased activity tolerance, bradykinesia.  She has slightly slowed gait velocity, 5x sit<>stand, and TUG scores as compared to last bout of therapy.  She would benefit from skilled PT to address updates to current HEP and to address intensity/amplitude/frequency of exercises to offset PD deficits.    Personal Factors and Comorbidities  Comorbidity 3+    Comorbidities  RA, anxiety, depression, HTN, hx of back pain    Examination-Activity Limitations  Transfers;Locomotion Level    Examination-Participation Restrictions  Community Activity   Community fitness   Stability/Clinical Decision Making  Evolving/Moderate complexity    Clinical Decision Making  Moderate    Rehab Potential  Good    PT Frequency  1x / week    PT Duration  4 weeks   plus eva   PT Treatment/Interventions   ADLs/Self Care Home Management;Gait training;Neuromuscular re-education;Balance training;Therapeutic activities;Functional mobility training;Patient/family education    PT Next Visit Plan  [place] PWR! Moves HEP (may need to switch to modified quadruped vs. quadruped, due to recent knee pain); discuss/demo intensity of exercise (need to see if SOB issues are better; check BP and O2)    Consulted and Agree with Plan of Care  Patient       Patient will benefit from skilled therapeutic intervention in order to improve the following deficits and impairments:  Abnormal gait, Decreased activity tolerance, Decreased balance, Decreased mobility, Difficulty walking, Decreased strength, Postural dysfunction  Visit Diagnosis: Other abnormalities of gait and mobility  Abnormal posture  Other symptoms and signs involving the nervous system  Unsteadiness on feet     Problem List Patient Active Problem List   Diagnosis Date Noted  . Cough 05/10/2018  . Rheumatoid arthritis ([place]) 01/08/2018  . Hand pain, right 10/15/2017  . Vitamin D deficiency 12/20/2016  . Prediabetes 12/20/2016  . Shortness of breath on exertion 11/27/2016  . Preventative health care 12/19/2015  . Constipation 12/07/2015  . BCC (basal cell carcinoma of skin) 10/05/2014  . Rosacea 10/05/2014  . Parkinson's disease ([place]) 05/11/2014  . Visual floaters 05/11/2014  . Paralysis agitans ([place]) 01/08/2014  . Screening for cervical cancer 07/17/2012  . Foot pain, bilateral 06/19/2012  . 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 Obesity 11/12/2009  . Depression with anxiety 11/02/2009  . 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 Lumbar back pain with radiculopathy affecting lower extremity 10/17/2007  . Essential hypertension 07/05/2007  . Osteoarthritis 07/05/2007  . Hyperlipidemia 06/26/2007  . H/O: iron deficiency anemia 05/08/2007  . GERD 05/08/2007    Trayonna Bachmeier W. 05/05/2019, 12:50 PM Frazier Butt., PT  [facility] [address] [place], [place], [address] Phone: [phone]   Fax:  [phone]  Name: Kristin [name] MRN: [id number] Date of Birth: [birth year]

## 2019-05-06 ENCOUNTER — Encounter: Payer: Self-pay | Admitting: Occupational Therapy

## 2019-05-06 ENCOUNTER — Encounter: Payer: Self-pay | Admitting: Family Medicine

## 2019-05-06 ENCOUNTER — Ambulatory Visit: Payer: Medicare Other | Attending: Neurology | Admitting: Occupational Therapy

## 2019-05-06 DIAGNOSIS — R278 Other lack of coordination: Secondary | ICD-10-CM | POA: Diagnosis not present

## 2019-05-06 DIAGNOSIS — R251 Tremor, unspecified: Secondary | ICD-10-CM | POA: Diagnosis not present

## 2019-05-06 DIAGNOSIS — R293 Abnormal posture: Secondary | ICD-10-CM | POA: Diagnosis not present

## 2019-05-06 DIAGNOSIS — R29818 Other symptoms and signs involving the nervous system: Secondary | ICD-10-CM | POA: Diagnosis not present

## 2019-05-06 DIAGNOSIS — R41840 Attention and concentration deficit: Secondary | ICD-10-CM | POA: Diagnosis not present

## 2019-05-06 DIAGNOSIS — R29898 Other symptoms and signs involving the musculoskeletal system: Secondary | ICD-10-CM

## 2019-05-06 DIAGNOSIS — R2689 Other abnormalities of gait and mobility: Secondary | ICD-10-CM | POA: Diagnosis not present

## 2019-05-06 DIAGNOSIS — R2681 Unsteadiness on feet: Secondary | ICD-10-CM | POA: Diagnosis not present

## 2019-05-06 NOTE — Therapy (Signed)
[facility] [address] [place], [place], [address] Phone: [phone]   Fax:  [phone]  Occupational Therapy Evaluation  Patient Details  Name: Kristin [name] MRN: [id number] Date of Birth: 1951-01-23 Referring Provider (OT): Dr. [name] Tat   Encounter Date: 05/06/2019  OT End of Session - 05/06/19 0911    Visit Number  1    Number of Visits  8    Date for OT Re-Evaluation  07/05/19    Authorization Type  Medicare & [employer], covered 100%    Authorization - Visit Number  1    Authorization - Number of Visits  10    OT Start Time  0720    OT Stop Time  0800    OT Time Calculation (min)  40 min    Activity Tolerance  Patient tolerated treatment well    Behavior During Therapy  [facility] for tasks assessed/performed       Past Medical History:  Diagnosis Date  . Abnormal vaginal Pap smear   . Acute pharyngitis 02/25/2013  . Anemia   . Anxiety   . Arthritis   . 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 Chicken pox as a child  . Chronic UTI    sees dr Terance Hart  . Constipation 12/07/2015  . Depression with anxiety 11/02/2009   Qualifier: Diagnosis of  By: [name], LPN, [name]   . Dermatitis 07/17/2012  . Esophageal stricture 1994  . Fibroids   . Foot pain, bilateral 06/19/2012  . GERD (gastroesophageal reflux disease)   . GERD (gastroesophageal reflux disease)   . Hiatal hernia   . Hyperglycemia 08/19/2013  . 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 Hyperlipidemia   . Hypertension   . Infertility, female   . Low back pain 10/17/2007   Qualifier: Diagnosis of  By: [name], [name]   . Measles as a child  . Obesity   . Osteoarthritis   . Parkinson disease ([place])   . Plantar fasciitis of left foot 06/19/2012  . Preventative health care 12/19/2015  . Rheumatoid arthritis ([place]) 01/08/2018  . Rosacea 10/05/2014  . Swallowing difficulty   . Urinary frequency 02/25/2013  . Visual floaters  05/11/2014    Past Surgical History:  Procedure Laterality Date  . ABDOMINAL HYSTERECTOMY  2006   total  . esophageal     stretching  . laporoscopy    . PARTIAL HIP ARTHROPLASTY  2010   right  . TONSILLECTOMY    . wisdom teeth extracted      There were no vitals filed for this visit.  Subjective Assessment - 05/06/19 0728    Subjective   Mild regular joint pain in hands, but it's always there (RA)    Pertinent History  Parkinson's Disease.    PMH:  RA, depression, anxiety, HTN, OA, GERD, prediabetes, vitamin D deficiency, basal cell carcinoma of skin, hyperhydrosis, hyperlipidemia    Patient Stated Goals  improve R hand coordination/use, improve ability to don earrings    Currently in Pain?  No/denies        [facility] OT Assessment - 05/06/19 0001      Assessment   Medical Diagnosis  Parkinson's disease    Onset Date/Surgical Date  --   last OT d/c 09/02/28   Hand Dominance  Left    Prior Therapy  last OT d/c 12/19      Precautions   Precautions  Fall  Balance Screen   Has the patient fallen in the past 6 months  Yes    How many times?  1 out of bed when asleep      Home  Environment   Family/patient expects to be discharged to:  Private residence    Living Arrangements  Spouse/significant other    Lives With  Significant other;Son   son does yardwork     Prior Function   Level of Independence  Independent    Vocation  Retired    Leisure  Since [employer], has begun to work out at [employer] and is biking, walking at home, Media planner   spending time with friends, piano     ADL   Eating/Feeding  Modified independent   choked 2x on water, not eating as much when out   Grooming  Modified independent   supported hand   Upper Body Bathing  Modified independent    Lower Body Bathing  Modified independent    Upper Body Dressing  --   mod I   Lower Body Dressing  Modified independent   uses shoe horn   Toilet Transfer  Modified independent    Toileting - Clothing  Manipulation  Modified independent    [place] Transfer  Modified independent    ADL comments  difficulty getting backs on earrings, feels like she isn't as strong with R hand, typing--pt reports getting off home keys      IADL   Shopping  Takes care of all shopping needs independently   but son performs most of the time by choice   Prior Level of Function Light Housekeeping  independent    Light Housekeeping  --   independent   Prior Level of Function Meal Prep  independent    Meal Prep  --   making new dishes   Prior Level of Function Community Mobility  independent    Community Mobility  Drives own vehicle    Medication Management  Is responsible for taking medication in correct dosages at correct time      Mobility   Mobility Status  Independent   slower now     Written Expression   Dominant Hand  Left    Handwriting  100% legible      Cognition   Overall Cognitive Status  Within Functional Limits for tasks assessed    Cognition Comments  Pt reports mild difficulty with word retrieval, difficulty with multiple people talking at one times      Observation/Other Assessments   Other Surveys   Select    Physical Performance Test    Yes    Donning Doffing Jacket Time (seconds)  9.56    Donning Doffing Jacket Comments  fastening/unfastening 3 buttons in 16.59sec      Posture/Postural Control   Posture/Postural Control  Postural limitations    Postural Limitations  Forward head;Rounded Shoulders      Coordination   9 Hole Peg Test  Right;Left    Right 9 Hole Peg Test  30.91    Left 9 Hole Peg Test  20.47    Box and Blocks  R-53blocks, L-64blocks      Tone   Assessment Location  Right Upper Extremity;Left Upper Extremity      ROM / Strength   AROM / PROM / Strength  AROM      AROM   Overall AROM   Within functional limits for tasks performed  Overall AROM Comments  reports mild difficulty with finger abduction of R  4-5 digits intermittently      Hand Function   Right Hand Grip (lbs)  45    Left Hand Grip (lbs)  49      RUE Tone   RUE Tone  Mild      LUE Tone   LUE Tone  Within Functional Limits                      OT Education - 05/06/19 0910    Education Details  OT eval results/POC    Person(s) Educated  Patient    Methods  Explanation    Comprehension  Verbalized understanding       OT Short Term Goals - 05/06/19 0921      OT SHORT TERM GOAL #1   Title  Pt will be independent with updated HEP.--check STGs 06/05/19    Time  4    Period  Weeks    Status  New      OT SHORT TERM GOAL #2   Title  Pt will be able to don/doff earrings using AE prn.    Time  4    Period  Weeks    Status  New        OT Long Term Goals - 05/06/19 [id number]      OT LONG TERM GOAL #1   Title  Pt will verbalize understanding of adaptive stratgies to incr. ease/efficiency with ADLs/IADLs--check LTGs 07/05/19    Time  8    Period  Weeks    Status  New      OT LONG TERM GOAL #2   Title  Pt will improve R hand coordination for ADLs as shown by improving time on 9-hole peg test by at least 7sec.    Baseline  30.91sec    Time  8    Period  Weeks    Status  New      OT LONG TERM GOAL #3   Title  Pt will improve functional reaching/coordination for ADLs as shown by improving score on box and blocks test by at least 7 blocks with RUE.    Baseline  R-53 blocks    Time  8    Period  Weeks    Status  New      OT LONG TERM GOAL #4   Title  ---      OT LONG TERM GOAL #5   Title  ---            Plan - 05/06/19 0912    Clinical Impression Statement  Pt is a 68 y.o. female with Parkinson's Disease.  Pt is familiar with this pt from previous rounds of occupational therapy (last d/c 12/19).  Pt with PMH that includes:   RA, depression, anxiety, HTN,OA, GERD, prediabetes, vitamin D deficiency, basal cell carcinoma of skin, hyperhydrosis, hyperlipidemia, R partial hip arthroplasty.  Pt  reports slowing, particularly with mobility and difficulty with R hand coordination.  Pt presents today with decr coordination, bradykinesia, rigidity, decr posture, decr balance/functional mobility, mild difficulty with divided attention.  Note particular decline in RUE coordination/functional reaching outcome measures as compared with last OT d/c.  Pt would benefit from occupational therapy to improve UE functional use, decr risk for future complications, incr ease/efficienty with ADLs/IADLs, and improve quality of life.    OT Occupational Profile and History  Detailed Assessment- Review of Records and additional review of physical,  cognitive, psychosocial history related to current functional performance    Occupational performance deficits (Please refer to evaluation for details):  ADL's;IADL's;Leisure;Social Participation    Body Structure / Function / Physical Skills  ADL;Dexterity;ROM;IADL;Balance;Mobility;Tone;FMC;Coordination;Decreased knowledge of use of DME;UE functional use;GMC    Rehab Potential  Good    Clinical Decision Making  Limited treatment options, no task modification necessary    Comorbidities Affecting Occupational Performance:  May have comorbidities impacting occupational performance    Modification or Assistance to Complete Evaluation   Min-Moderate modification of tasks or assist with assess necessary to complete eval    OT Frequency  1x / week    OT Duration  8 weeks   +eval, but likely 6 weeks   OT Treatment/Interventions  Self-care/ADL training;Moist Heat;Fluidtherapy;DME and/or AE instruction;Therapeutic activities;Therapeutic exercise;Cognitive remediation/compensation;Passive range of motion;Functional Mobility Training;Neuromuscular education;Cryotherapy;Paraffin;Energy conservation;Manual Therapy;Patient/family education    Plan  review coordination HEP, functional reaching    Consulted and Agree with Plan of Care  Patient       Patient will benefit from skilled  therapeutic intervention in order to improve the following deficits and impairments:   Body Structure / Function / Physical Skills: ADL, Dexterity, ROM, IADL, Balance, Mobility, Tone, FMC, Coordination, Decreased knowledge of use of DME, UE functional use, GMC       Visit Diagnosis: Other symptoms and signs involving the nervous system  Other symptoms and signs involving the musculoskeletal system  Other lack of coordination  Tremor  Abnormal posture  Attention and concentration deficit  Unsteadiness on feet  Other abnormalities of gait and mobility    Problem List Patient Active Problem List   Diagnosis Date Noted  . Cough 05/10/2018  . Rheumatoid arthritis ([place]) 01/08/2018  . Hand pain, right 10/15/2017  . Vitamin D deficiency 12/20/2016  . Prediabetes 12/20/2016  . Shortness of breath on exertion 11/27/2016  . Preventative health care 12/19/2015  . Constipation 12/07/2015  . BCC (basal cell carcinoma of skin) 10/05/2014  . Rosacea 10/05/2014  . Parkinson's disease ([place]) 05/11/2014  . Visual floaters 05/11/2014  . Paralysis agitans ([place]) 01/08/2014  . Screening for cervical cancer 07/17/2012  . Foot pain, bilateral 06/19/2012  . 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 Obesity 11/12/2009  . Depression with anxiety 11/02/2009  . 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 Lumbar back pain with radiculopathy affecting lower extremity 10/17/2007  . Essential hypertension 07/05/2007  . Osteoarthritis 07/05/2007  . Hyperlipidemia 06/26/2007  . H/O: iron deficiency anemia 05/08/2007  . GERD 05/08/2007    [facility] 05/06/2019, 11:18 AM  [facility] [address] [place], [place], [address] Phone: [phone]   Fax:  [phone]  Name: Kristin [name] MRN: [id number] Date of Birth: 17-Jun-1951   Vianne Bulls, OTR/L [facility] [address] [place], [place]   [address] [phone] phone [phone] 05/06/19 11:19 AM

## 2019-05-07 ENCOUNTER — Other Ambulatory Visit: Payer: Self-pay | Admitting: Family Medicine

## 2019-05-07 ENCOUNTER — Other Ambulatory Visit: Payer: Self-pay | Admitting: Emergency Medicine

## 2019-05-07 DIAGNOSIS — G20A1 Parkinson's disease without dyskinesia, without mention of fluctuations: Secondary | ICD-10-CM

## 2019-05-07 DIAGNOSIS — I10 Essential (primary) hypertension: Secondary | ICD-10-CM

## 2019-05-07 DIAGNOSIS — G20 Parkinson's disease: Secondary | ICD-10-CM

## 2019-05-07 DIAGNOSIS — R6889 Other general symptoms and signs: Secondary | ICD-10-CM | POA: Diagnosis not present

## 2019-05-07 DIAGNOSIS — R0600 Dyspnea, unspecified: Secondary | ICD-10-CM

## 2019-05-07 DIAGNOSIS — E785 Hyperlipidemia, unspecified: Secondary | ICD-10-CM

## 2019-05-07 DIAGNOSIS — Z20822 Contact with and (suspected) exposure to covid-19: Secondary | ICD-10-CM

## 2019-05-07 DIAGNOSIS — R7303 Prediabetes: Secondary | ICD-10-CM

## 2019-05-09 ENCOUNTER — Ambulatory Visit (HOSPITAL_BASED_OUTPATIENT_CLINIC_OR_DEPARTMENT_OTHER)
Admission: RE | Admit: 2019-05-09 | Discharge: 2019-05-09 | Disposition: A | Discharge status: Home / Self Care | Payer: Medicare Other | Source: Ambulatory Visit | Attending: Family Medicine | Admitting: Family Medicine

## 2019-05-09 ENCOUNTER — Other Ambulatory Visit: Payer: Self-pay

## 2019-05-09 DIAGNOSIS — R7303 Prediabetes: Secondary | ICD-10-CM | POA: Diagnosis not present

## 2019-05-09 DIAGNOSIS — E785 Hyperlipidemia, unspecified: Secondary | ICD-10-CM | POA: Diagnosis not present

## 2019-05-09 DIAGNOSIS — I10 Essential (primary) hypertension: Secondary | ICD-10-CM | POA: Diagnosis not present

## 2019-05-09 DIAGNOSIS — R0600 Dyspnea, unspecified: Secondary | ICD-10-CM | POA: Diagnosis not present

## 2019-05-09 DIAGNOSIS — G20 Parkinson's disease: Secondary | ICD-10-CM | POA: Insufficient documentation

## 2019-05-09 DIAGNOSIS — G20A1 Parkinson's disease without dyskinesia, without mention of fluctuations: Secondary | ICD-10-CM

## 2019-05-09 LAB — NOVEL CORONAVIRUS, NAA: SARS-CoV-2, NAA: NOT DETECTED

## 2019-05-09 NOTE — Progress Notes (Signed)
  Echocardiogram 2D Echocardiogram has been performed.  Cardell Peach 05/09/2019, 11:01 AM

## 2019-05-13 ENCOUNTER — Other Ambulatory Visit: Payer: Self-pay | Admitting: Neurology

## 2019-05-13 NOTE — Telephone Encounter (Signed)
Requested Prescriptions   Pending Prescriptions Disp Refills  . carbidopa-levodopa (SINEMET IR) 25-100 MG tablet [Pharmacy Med Name: CARBIDOPA-LEVODOPA 25-100 TAB] 450 tablet 1    Sig: TAKE 2 TABLETS EVERY MORNING, 2 TABLETS IN THE AFTERNOON, AND 1 TABLET IN THE EVENING   Rx last filled:11/13/18 #450 1 REFILLS  Pt last seen:05/05/19   Follow up appt scheduled:10/09/2019

## 2019-05-15 ENCOUNTER — Other Ambulatory Visit: Payer: Self-pay

## 2019-05-15 ENCOUNTER — Ambulatory Visit: Payer: Medicare Other | Admitting: Physical Therapy

## 2019-05-15 ENCOUNTER — Ambulatory Visit: Payer: Medicare Other | Admitting: Cardiology

## 2019-05-15 ENCOUNTER — Encounter: Payer: Self-pay | Admitting: *Deleted

## 2019-05-15 ENCOUNTER — Encounter: Payer: Self-pay | Admitting: Cardiology

## 2019-05-15 ENCOUNTER — Ambulatory Visit (INDEPENDENT_AMBULATORY_CARE_PROVIDER_SITE_OTHER): Payer: Medicare Other | Admitting: Cardiology

## 2019-05-15 VITALS — BP 110/80 | HR 73 | Temp 95.7°F | Ht 64.0 in | Wt 245.0 lb

## 2019-05-15 VITALS — BP 131/77 | HR 107

## 2019-05-15 DIAGNOSIS — R2681 Unsteadiness on feet: Secondary | ICD-10-CM

## 2019-05-15 DIAGNOSIS — E782 Mixed hyperlipidemia: Secondary | ICD-10-CM | POA: Diagnosis not present

## 2019-05-15 DIAGNOSIS — R2689 Other abnormalities of gait and mobility: Secondary | ICD-10-CM

## 2019-05-15 DIAGNOSIS — R41840 Attention and concentration deficit: Secondary | ICD-10-CM | POA: Diagnosis not present

## 2019-05-15 DIAGNOSIS — R251 Tremor, unspecified: Secondary | ICD-10-CM | POA: Diagnosis not present

## 2019-05-15 DIAGNOSIS — I5189 Other ill-defined heart diseases: Secondary | ICD-10-CM

## 2019-05-15 DIAGNOSIS — M069 Rheumatoid arthritis, unspecified: Secondary | ICD-10-CM | POA: Diagnosis not present

## 2019-05-15 DIAGNOSIS — R29818 Other symptoms and signs involving the nervous system: Secondary | ICD-10-CM | POA: Diagnosis not present

## 2019-05-15 DIAGNOSIS — R0602 Shortness of breath: Secondary | ICD-10-CM

## 2019-05-15 DIAGNOSIS — R293 Abnormal posture: Secondary | ICD-10-CM | POA: Diagnosis not present

## 2019-05-15 DIAGNOSIS — R079 Chest pain, unspecified: Secondary | ICD-10-CM

## 2019-05-15 DIAGNOSIS — I10 Essential (primary) hypertension: Secondary | ICD-10-CM

## 2019-05-15 DIAGNOSIS — R278 Other lack of coordination: Secondary | ICD-10-CM | POA: Diagnosis not present

## 2019-05-15 DIAGNOSIS — R29898 Other symptoms and signs involving the musculoskeletal system: Secondary | ICD-10-CM | POA: Diagnosis not present

## 2019-05-15 MED ORDER — FUROSEMIDE 20 MG PO TABS: 20.0000 mg | ORAL_TABLET | Freq: Every day | ORAL | 1 refills | Status: DC

## 2019-05-15 NOTE — Patient Instructions (Signed)
Medication Instructions:  Your physician has recommended you make the following change in your medication:   START: Furosemide (LASIX) 20mg   Take  1 Tab on Tues,THurs and Saturdays only  If you need a refill on your cardiac medications before your next appointment, please call your pharmacy.   Lab work: Your physician recommends that you return for lab work in: 1 month BMP,Magnesium  If you have labs (blood work) drawn today and your tests are completely normal, you will receive your results only by: Marland Kitchen MyChart Message (if you have MyChart) OR . A paper copy in the mail If you have any lab test that is abnormal or we need to change your treatment, we will call you to review the results.  Testing/Procedures: Your physician has requested that you have a lexiscan myoview. For further information please visit [email or web address]. Please follow instruction sheet, as given.   Follow-Up: At [facility], you and your health needs are our priority.  As part of our continuing mission to provide you with exceptional heart care, we have created designated Provider Care Teams.  These Care Teams include your primary Cardiologist (physician) and Advanced Practice Providers (APPs -  Physician Assistants and Nurse Practitioners) who all work together to provide you with the care you need, when you need it. You will need a follow up appointment in 1 months with Dr Harriet Masson Any Other Special Instructions Will Be Listed Below (If Applicable).

## 2019-05-15 NOTE — Therapy (Signed)
[facility] [address] [place], [place], [address] Phone: [phone]   Fax:  [phone]  Physical Therapy Treatment  Patient Details  Name: Kristin [name] MRN: [id number] Date of Birth: 10-04-1950 Referring Provider (PT): Tat, [name]   Encounter Date: 05/15/2019  PT End of Session - 05/15/19 1606    Visit Number  2    Number of Visits  5    Date for PT Re-Evaluation  07/04/19    Authorization Type  Medicare/[employer]; Covered 100%    PT Start Time  1020    PT Stop Time  1103    PT Time Calculation (min)  43 min    Activity Tolerance  Other (comment)   limited by dyspnea   Behavior During Therapy  [place] for tasks assessed/performed       Past Medical History:  Diagnosis Date  . Abnormal vaginal Pap smear   . Acute pharyngitis 02/25/2013  . Anemia   . Anxiety   . Arthritis   . 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 Chicken pox as a child  . Chronic UTI    sees dr Terance Hart  . Constipation 12/07/2015  . Depression with anxiety 11/02/2009   Qualifier: Diagnosis of  By: [name], LPN, [name]   . Dermatitis 07/17/2012  . Esophageal stricture 1994  . Fibroids   . Foot pain, bilateral 06/19/2012  . GERD (gastroesophageal reflux disease)   . GERD (gastroesophageal reflux disease)   . Hiatal hernia   . Hyperglycemia 08/19/2013  . 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 Hyperlipidemia   . Hypertension   . Infertility, female   . Low back pain 10/17/2007   Qualifier: Diagnosis of  By: [name], [name]   . Measles as a child  . Obesity   . Osteoarthritis   . Parkinson disease ([place])   . Plantar fasciitis of left foot 06/19/2012  . Preventative health care 12/19/2015  . Rheumatoid arthritis ([place]) 01/08/2018  . Rosacea 10/05/2014  . Swallowing difficulty   . Urinary frequency 02/25/2013  . Visual floaters 05/11/2014    Past Surgical History:  Procedure Laterality Date  . ABDOMINAL  HYSTERECTOMY  2006   total  . esophageal     stretching  . laporoscopy    . TONSILLECTOMY    . TOTAL HIP ARTHROPLASTY Right 2010  . wisdom teeth extracted      Vitals:   05/15/19 1028 05/15/19 1042  BP: 106/61 131/77  Pulse: 78 (!) 107  SpO2: 98% 96%    Subjective Assessment - 05/15/19 1024    Subjective  Pt reports her husband had a stroke this weekend and was admitted to hospital but is home now.  Still having dyspnea with activities.  Has f/u appointment with cardiology this afternoon.  Pt concerned over reasoning behind dyspnea.    Pertinent History  RA (knee pain)    Patient Stated Goals  Pt's goals for therapy are to improve strength.    Currently in Pain?  No/denies        [facility] Adult PT Treatment/Exercise - 05/15/19 0001      Transfers   Transfers  Sit to Stand;Stand to Sit    Sit to Stand  6: Modified independent (Device/Increase time);Without upper extremity assist;From chair/3-in-1    Stand to Sit  6: Modified independent (Device/Increase time);Without upper extremity assist;To chair/3-in-1      Ambulation/Gait   Ambulation/Gait  Yes  Ambulation/Gait Assistance  6: Modified independent (Device/Increase time)    Ambulation Distance (Feet)  806 Feet    Assistive device  None    Gait Pattern  Step-through pattern;Decreased arm swing - right;Decreased stance time - right;Decreased trunk rotation    Ambulation Surface  Level;Indoor    Gait Comments  4 minute walk for 806 ft with no standing rest breaks during session but could not continue any further.  Stated that she felt like if she could remove her mask that she might be able to go further.  Pt recovered after approximately 5 minute rest break.  See vitals for details but no significant decline in SaO2.      Posture/Postural Control   Posture/Postural Control  Postural limitations    Postural Limitations  Forward head;Rounded Shoulders      Self-Care   Self-Care  Other Self-Care Comments    Other Self-Care  Comments   Discussed pt's c/o dyspnea, her BP and SaO2 readings during session.  Discussed intensity with exercise and walking.  Discussed walking program and how to progress.  Provided example of progression in pt instructions.         PT Long Term Goals - 05/05/19 1246      PT LONG TERM GOAL #1   Title  Pt will be independent with HEP for improved functional mobility, strength, balance, PD-specific deficits.  TARGET 06/06/2019 (may be delayed due to delayed start coordinating PT, OT)    Time  4    Period  Weeks    Status  New    Target Date  06/06/19      PT LONG TERM GOAL #2   Title  Pt will improve 5x sit<>stand to less than or equal to 10 seconds for improved functional strength.    Time  4    Period  Weeks    Status  New    Target Date  06/06/19      PT LONG TERM GOAL #3   Title  Pt will improve 3 MWT distance to at least 575 ft for improved gait efficiency and safety for community walking.    Time  4    Period  Weeks    Status  New    Target Date  06/06/19      PT LONG TERM GOAL #4   Title  Pt will verbalize understanding of appropriate ongoing community fitness upon d/c from PT.    Time  4    Period  Weeks    Status  New    Target Date  06/06/19            Plan - 05/15/19 1606    Clinical Impression Statement  Pt continues to have dyspnea on exertion but was improved slightly in clinic today.  SaO2 did not decrease significantly with dyspnea.  Pt seeing cardiologist today to find out results of echocardiogram.  Continue PT per POC as pt tolerates working toward increased endurance.    Personal Factors and Comorbidities  Comorbidity 3+    Comorbidities  RA, anxiety, depression, HTN, hx of back pain    Examination-Activity Limitations  Transfers;Locomotion Level    Examination-Participation Restrictions  Community Activity   Community fitness   Stability/Clinical Decision Making  Evolving/Moderate complexity    Rehab Potential  Good    PT Frequency  1x / week     PT Duration  4 weeks   plus eva   PT Treatment/Interventions  ADLs/Self Care Home Management;Gait training;Neuromuscular re-education;Balance training;Therapeutic  activities;Functional mobility training;Patient/family education    PT Next Visit Plan  [facility]! Moves HEP (may need to switch to modified quadruped vs. quadruped, due to recent knee pain); discuss/demo intensity of exercise (need to see if SOB issues are better; check BP and O2)    Consulted and Agree with Plan of Care  Patient       Patient will benefit from skilled therapeutic intervention in order to improve the following deficits and impairments:  Abnormal gait, Decreased activity tolerance, Decreased balance, Decreased mobility, Difficulty walking, Decreased strength, Postural dysfunction  Visit Diagnosis: Other abnormalities of gait and mobility  Unsteadiness on feet     Problem List Patient Active Problem List   Diagnosis Date Noted  . Cough 05/10/2018  . Rheumatoid arthritis ([place]) 01/08/2018  . Hand pain, right 10/15/2017  . Vitamin D deficiency 12/20/2016  . Prediabetes 12/20/2016  . Shortness of breath on exertion 11/27/2016  . Preventative health care 12/19/2015  . Constipation 12/07/2015  . BCC (basal cell carcinoma of skin) 10/05/2014  . Rosacea 10/05/2014  . Parkinson's disease ([place]) 05/11/2014  . Visual floaters 05/11/2014  . Paralysis agitans ([place]) 01/08/2014  . Screening for cervical cancer 07/17/2012  . Foot pain, bilateral 06/19/2012  . 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 Obesity 11/12/2009  . Depression with anxiety 11/02/2009  . 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 Lumbar back pain with radiculopathy affecting lower extremity 10/17/2007  . Essential hypertension 07/05/2007  . Osteoarthritis 07/05/2007  . Hyperlipidemia 06/26/2007  . H/O: iron deficiency anemia 05/08/2007  . GERD 05/08/2007    Narda Bonds, PTA [place] 05/15/19 4:10 PM Phone: [phone] Fax: [facility] [address] [place], [place], [address] Phone: [phone]   Fax:  [phone]  Name: Kristin [name] MRN: [id number] Date of Birth: 1951-07-04

## 2019-05-15 NOTE — Progress Notes (Signed)
Cardiology Office Note:    Date:  05/16/2019   ID:  TESHAWN BOJORQUEZ, DOB 27-Aug-1951, MRN [id number]  PCP:  Mosie Lukes, MD  Cardiologist:  No primary care provider on file.  Electrophysiologist:  None   Referring MD: Mosie Lukes, MD   Chief Complaint  Patient presents with  . Hyperlipidemia  . Shortness of Breath   History of Present Illness:    Kristin [name] is a 67 y.o. female with a hx of hypertension, hyperlipdemia, obesity, Rhematoid arhtritis presents to be evaluated for shortness of breath on exertion. The patient reports that this started back in July 2020. She reports that before that pandemic she was able to exercise at her local gym and once everything short down she started walking. She seemed fine however in July she while walking she was unable to keep up with her friends due to shortness of breath. She noticed that other activities do not make her as short of breath. She notes that she have been going to rehab and will perform other exercises but once she starts walking it just become difficult to breath and once she sits down it resolves. She was unable to complete a 6 mins test due to the same issue. She denies any palpitations, lightheadedness, chest pain or nausea.   No other complaints at this time.  Past Medical History:  Diagnosis Date  . Abnormal vaginal Pap smear   . Acute pharyngitis 02/25/2013  . Anemia   . Anxiety   . Arthritis   . 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 Chicken pox as a child  . Chronic UTI    sees dr Terance Hart  . Constipation 12/07/2015  . Depression with anxiety 11/02/2009   Qualifier: Diagnosis of  By: [name], LPN, [name]   . Dermatitis 07/17/2012  . Esophageal stricture 1994  . Fibroids   . Foot pain, bilateral 06/19/2012  . GERD (gastroesophageal reflux disease)   . GERD (gastroesophageal reflux disease)   . Hiatal hernia   . Hyperglycemia 08/19/2013  . 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  Hyperlipidemia   . Hypertension   . Infertility, female   . Low back pain 10/17/2007   Qualifier: Diagnosis of  By: [name], [name]   . Measles as a child  . Obesity   . Osteoarthritis   . Parkinson disease ([place])   . Plantar fasciitis of left foot 06/19/2012  . Preventative health care 12/19/2015  . Rheumatoid arthritis ([place]) 01/08/2018  . Rosacea 10/05/2014  . Swallowing difficulty   . Urinary frequency 02/25/2013  . Visual floaters 05/11/2014    Past Surgical History:  Procedure Laterality Date  . ABDOMINAL HYSTERECTOMY  2006   total  . esophageal     stretching  . laporoscopy    . TONSILLECTOMY    . TOTAL HIP ARTHROPLASTY Right 2010  . wisdom teeth extracted      Current Medications: Current Meds  Medication Sig  . albuterol (VENTOLIN HFA) 108 (90 Base) MCG/ACT inhaler Inhale 2 puffs into the lungs every 6 (six) hours as needed for wheezing or shortness of breath.  . ALPRAZolam (XANAX) 0.25 MG tablet TAKE 1/2 TO 1 TABLET TWICE A DAY AS NEEDED FOR ANXIETY  . B-D TB SYRINGE 1CC/27GX1/2" 27G X 1/2" 1 ML MISC   . carbidopa-levodopa (SINEMET CR) 50-200 MG tablet TAKE 1 TABLET BY MOUTH EVERYDAY AT BEDTIME  . carbidopa-levodopa (SINEMET IR) 25-100 MG tablet TAKE 2  TABLETS EVERY MORNING, 2 TABLETS IN THE AFTERNOON, AND 1 TABLET IN THE EVENING  . carvedilol (COREG) 3.125 MG tablet Take 1 tablet (3.125 mg total) by mouth 2 (two) times daily with a meal.  . DULoxetine (CYMBALTA) 30 MG capsule TAKE 1 CAPSULE BY MOUTH EVERY DAY  . escitalopram (LEXAPRO) 20 MG tablet TAKE 1 TABLET BY MOUTH EVERY DAY  . folic acid (FOLVITE) 1 MG tablet 3 mg.   . hydroxychloroquine (PLAQUENIL) 200 MG tablet Take 2 tablets (400 mg total) by mouth daily.  Marland Kitchen losartan (COZAAR) 50 MG tablet Take 1 tablet (50 mg total) by mouth daily.  . Methotrexate Sodium (METHOTREXATE, PF,) 50 MG/2ML injection   . omeprazole (PRILOSEC) 20 MG capsule TAKE 1 CAPSULE BY MOUTH EVERY DAY  . polyethylene glycol powder  (GLYCOLAX/MIRALAX) powder Take 17 g by mouth 2 (two) times daily as needed (1 to 2 times per day, as needed).  . pramipexole (MIRAPEX) 0.5 MG tablet Take 1 tablet (0.5 mg total) by mouth 3 (three) times daily.  . rosuvastatin (CRESTOR) 10 MG tablet TAKE 1 TABLET BY MOUTH ON MONDAYS, WEDNESDAYS AND FRIDAYS     Allergies:   Bactrim [sulfamethoxazole-trimethoprim] and Penicillins   Social History   Socioeconomic History  . Marital status: Married    Spouse name: Not on file  . Number of children: 1  . Years of education: Not on file  . Highest education level: Master's degree (e.g., MA, MS, MEng, MEd, MSW, MBA)  Occupational History  . Occupation: Retired Teacher, music  Social Needs  . Financial resource strain: Not on file  . Food insecurity    Worry: Not on file    Inability: Not on file  . Transportation needs    Medical: Not on file    Non-medical: Not on file  Tobacco Use  . Smoking status: Never Smoker  . Smokeless tobacco: Never Used  Substance and Sexual Activity  . Alcohol use: Yes    Alcohol/week: 4.0 standard drinks    Types: 4 Glasses of wine per week    Comment: occasionally  . Drug use: No  . Sexual activity: Not Currently    Partners: Male  Lifestyle  . Physical activity    Days per week: Not on file    Minutes per session: Not on file  . Stress: Not on file  Relationships  . Social Herbalist on phone: Not on file    Gets together: Not on file    Attends religious service: Not on file    Active member of club or organization: Not on file    Attends meetings of clubs or organizations: Not on file    Relationship status: Not on file  Other Topics Concern  . Not on file  Social History Narrative   Lives with husband, no major dietary restrictions, retired from teaching      Husband dx with cancer MDS June 2017.   Currently in remission. (11/03/16 pc)      Pt has one biological child the other 3 are adopted        Family History: The  patient's family history includes Arthritis in her father, sister, sister, and sister; Bipolar disorder in her mother; Cancer in her mother; Depression in her mother and sister; Heart disease in her father; Hyperlipidemia in her father and mother; Hypertension in her father; Mental illness in her mother and sister; Other in her mother; Parkinson's disease in her sister; Thyroid disease in her mother.  There is no history of Colon cancer, Esophageal cancer, Rectal cancer, or Stomach cancer.  ROS:   Please see the history of present illness.   Review of Systems  Constitution: Negative for chills, decreased appetite, malaise/fatigue and night sweats.  HENT: Negative for congestion, nosebleeds and stridor.   Eyes: Negative for blurred vision, photophobia and visual disturbance.  Cardiovascular: Positive for dyspnea on exertion. Negative for chest pain, near-syncope and palpitations.  Respiratory: Negative for cough, sputum production and wheezing.   Endocrine: Negative for cold intolerance.  Hematologic/Lymphatic: Negative for adenopathy.  Skin: Negative for nail changes, poor wound healing and suspicious lesions.  Musculoskeletal: Negative for arthritis, back pain, joint pain and muscle weakness.  Gastrointestinal: Negative for bloating, change in bowel habit, hematemesis and nausea.  Genitourinary: Negative for bladder incontinence, decreased libido, hematuria and non-menstrual bleeding.  Neurological: Negative for aphonia, excessive daytime sleepiness and light-headedness.  Psychiatric/Behavioral: Negative for altered mental status, depression and substance abuse. The patient is not nervous/anxious.   Allergic/Immunologic: Negative for HIV exposure.      EKGs/Labs/Other Studies Reviewed:    The following studies were reviewed today:  EKG:  The ekg ordered today demonstrates sinus rhythm, heart rate 74 bpm, left anterior block with early precordial transition.  Recent Labs: 11/05/2018: ALT 5;  BUN 16; Creatinine, Ser 0.65; Hemoglobin 12.0; Platelets 237.0; Potassium 4.5; Sodium 142; TSH 1.38  Recent Lipid Panel    Component Value Date/Time   CHOL 163 11/05/2018 0953   CHOL 180 11/27/2016 1123   TRIG 87.0 11/05/2018 0953   HDL 64.80 11/05/2018 0953   HDL 57 11/27/2016 1123   CHOLHDL 3 11/05/2018 0953   VLDL 17.4 11/05/2018 0953   LDLCALC 81 11/05/2018 0953   LDLCALC 88 11/27/2016 1123   LDLDIRECT 161.5 10/07/2008 0944    Physical Exam:    VS:  BP 110/80 (BP Location: Left Arm, Patient Position: Sitting, Cuff Size: Normal)   Pulse 73   Temp (!) 95.7 F (35.4 C)   Ht 5\' 4"  (1.626 m)   Wt 245 lb (111.1 kg)   SpO2 95%   BMI 42.05 kg/m     Wt Readings from Last 3 Encounters:  05/15/19 245 lb (111.1 kg)  05/05/19 248 lb 12.8 oz (112.9 kg)  11/05/18 247 lb (112 kg)     GEN:  Well nourished, well developed in no acute distress HEENT: Normal NECK: No JVD; No carotid bruits LYMPHATICS: No lymphadenopathy CARDIAC: S1S2, RRR, no murmurs, rubs, gallops RESPIRATORY:  Clear to auscultation without rales, wheezing or rhonchi  ABDOMEN: Soft, non-tender, non-distended EXTREMITIES: No edema, no cyanosis, No clubbing MUSCULOSKELETAL:  No edema; No deformity  SKIN: Warm and dry NEUROLOGIC:  Alert and oriented x 3, nonfocal PSYCHIATRIC:  Normal affect   ASSESSMENT:    1. Essential hypertension   2. Chest pain, unspecified type   3. Mixed hyperlipidemia   4. Rheumatoid arthritis, involving unspecified site, unspecified rheumatoid factor presence ([place])   5. Diastolic dysfunction   6. Shortness of breath on exertion    PLAN:    In order of problems listed above:  Given her risk factor she is at intermediate risk for CAD, therefore at this time I will pursue ischemic evaluation with a pharmacologic stress test. She has been educated about this and is in agreement.  Her TTE was reviewed and there is evidence of diastolic dysfunction. With her diastolic dysfunction , with  her shortness of breath with bilateral leg edema I will cautiously diurese her. Lasix 20mg   Tu Th and sat as been ordered.  She will follow up in a month.     Disposition:   Medication Adjustments/Labs and Tests Ordered: Current medicines are reviewed at length with the patient today.  Concerns regarding medicines are outlined above.  Orders Placed This Encounter  Procedures  . Basic Metabolic Panel (BMET)  . Magnesium  . MYOCARDIAL PERFUSION IMAGING  . EKG 12-Lead   Meds ordered this encounter  Medications  . furosemide (LASIX) 20 MG tablet    Sig: Take 1 tablet (20 mg total) by mouth daily. On Tues ,Thurs and Satudays only    Dispense:  45 tablet    Refill:  1    Patient Instructions  Medication Instructions:  Your physician has recommended you make the following change in your medication:   START: Furosemide (LASIX) 20mg   Take  1 Tab on Tues,THurs and Saturdays only  If you need a refill on your cardiac medications before your next appointment, please call your pharmacy.   Lab work: Your physician recommends that you return for lab work in: 1 month BMP,Magnesium  If you have labs (blood work) drawn today and your tests are completely normal, you will receive your results only by: Marland Kitchen MyChart Message (if you have MyChart) OR . A paper copy in the mail If you have any lab test that is abnormal or we need to change your treatment, we will call you to review the results.  Testing/Procedures: Your physician has requested that you have a lexiscan myoview. For further information please visit [email or web address]. Please follow instruction sheet, as given.   Follow-Up: At [facility], you and your health needs are our priority.  As part of our continuing mission to provide you with exceptional heart care, we have created designated Provider Care Teams.  These Care Teams include your primary Cardiologist (physician) and Advanced Practice Providers (APPs -  Physician Assistants  and Nurse Practitioners) who all work together to provide you with the care you need, when you need it. You will need a follow up appointment in 1 months with Dr Harriet Masson Any Other Special Instructions Will Be Listed Below (If Applicable).       Rolly Pancake, DO  05/16/2019 9:21 AM    [facility]

## 2019-05-15 NOTE — Patient Instructions (Signed)
For walking program:  Think of it in 10 minute increments.  Example:  Walk for 5 minutes then rest for 5 minutes.  Do this 3 times a day (does not have to be in a row).  Try to increase by 1 minute every week.

## 2019-05-15 NOTE — H&P (View-Only) (Signed)
Cardiology Office Note:    Date:  05/16/2019   ID:  Kristin [name], DOB [birth year], MRN [id number]  PCP:  Mosie Lukes, MD  Cardiologist:  No primary care provider on file.  Electrophysiologist:  None   Referring MD: Mosie Lukes, MD   Chief Complaint  Patient presents with  . Hyperlipidemia  . Shortness of Breath   History of Present Illness:    Kristin [name] is a 68 y.o. female with a hx of hypertension, hyperlipdemia, obesity, Rhematoid arhtritis presents to be evaluated for shortness of breath on exertion. The patient reports that this started back in July 2020. She reports that before that pandemic she was able to exercise at her local gym and once everything short down she started walking. She seemed fine however in July she while walking she was unable to keep up with her friends due to shortness of breath. She noticed that other activities do not make her as short of breath. She notes that she have been going to rehab and will perform other exercises but once she starts walking it just become difficult to breath and once she sits down it resolves. She was unable to complete a 6 mins test due to the same issue. She denies any palpitations, lightheadedness, chest pain or nausea.   No other complaints at this time.  Past Medical History:  Diagnosis Date  . Abnormal vaginal Pap smear   . Acute pharyngitis 02/25/2013  . Anemia   . Anxiety   . Arthritis   . 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 Chicken pox as a child  . Chronic UTI    sees dr Terance Hart  . Constipation 12/07/2015  . Depression with anxiety 11/02/2009   Qualifier: Diagnosis of  By: [name], LPN, [name]   . Dermatitis 07/17/2012  . Esophageal stricture 1994  . Fibroids   . Foot pain, bilateral 06/19/2012  . GERD (gastroesophageal reflux disease)   . GERD (gastroesophageal reflux disease)   . Hiatal hernia   . Hyperglycemia 08/19/2013  . 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  Hyperlipidemia   . Hypertension   . Infertility, female   . Low back pain 10/17/2007   Qualifier: Diagnosis of  By: [name], [name]   . Measles as a child  . Obesity   . Osteoarthritis   . Parkinson disease ([place])   . Plantar fasciitis of left foot 06/19/2012  . Preventative health care 12/19/2015  . Rheumatoid arthritis ([place]) 01/08/2018  . Rosacea 10/05/2014  . Swallowing difficulty   . Urinary frequency 02/25/2013  . Visual floaters 05/11/2014    Past Surgical History:  Procedure Laterality Date  . ABDOMINAL HYSTERECTOMY  2006   total  . esophageal     stretching  . laporoscopy    . TONSILLECTOMY    . TOTAL HIP ARTHROPLASTY Right 2010  . wisdom teeth extracted      Current Medications: Current Meds  Medication Sig  . albuterol (VENTOLIN HFA) 108 (90 Base) MCG/ACT inhaler Inhale 2 puffs into the lungs every 6 (six) hours as needed for wheezing or shortness of breath.  . ALPRAZolam (XANAX) 0.25 MG tablet TAKE 1/2 TO 1 TABLET TWICE A DAY AS NEEDED FOR ANXIETY  . B-D TB SYRINGE 1CC/27GX1/2" 27G X 1/2" 1 ML MISC   . carbidopa-levodopa (SINEMET CR) 50-200 MG tablet TAKE 1 TABLET BY MOUTH EVERYDAY AT BEDTIME  . carbidopa-levodopa (SINEMET IR) 25-100 MG tablet TAKE 2  TABLETS EVERY MORNING, 2 TABLETS IN THE AFTERNOON, AND 1 TABLET IN THE EVENING  . carvedilol (COREG) 3.125 MG tablet Take 1 tablet (3.125 mg total) by mouth 2 (two) times daily with a meal.  . DULoxetine (CYMBALTA) 30 MG capsule TAKE 1 CAPSULE BY MOUTH EVERY DAY  . escitalopram (LEXAPRO) 20 MG tablet TAKE 1 TABLET BY MOUTH EVERY DAY  . folic acid (FOLVITE) 1 MG tablet 3 mg.   . hydroxychloroquine (PLAQUENIL) 200 MG tablet Take 2 tablets (400 mg total) by mouth daily.  Marland Kitchen losartan (COZAAR) 50 MG tablet Take 1 tablet (50 mg total) by mouth daily.  . Methotrexate Sodium (METHOTREXATE, PF,) 50 MG/2ML injection   . omeprazole (PRILOSEC) 20 MG capsule TAKE 1 CAPSULE BY MOUTH EVERY DAY  . polyethylene glycol powder  (GLYCOLAX/MIRALAX) powder Take 17 g by mouth 2 (two) times daily as needed (1 to 2 times per day, as needed).  . pramipexole (MIRAPEX) 0.5 MG tablet Take 1 tablet (0.5 mg total) by mouth 3 (three) times daily.  . rosuvastatin (CRESTOR) 10 MG tablet TAKE 1 TABLET BY MOUTH ON MONDAYS, WEDNESDAYS AND FRIDAYS     Allergies:   Bactrim [sulfamethoxazole-trimethoprim] and Penicillins   Social History   Socioeconomic History  . Marital status: Married    Spouse name: Not on file  . Number of children: 1  . Years of education: Not on file  . Highest education level: Master's degree (e.g., MA, MS, MEng, MEd, MSW, MBA)  Occupational History  . Occupation: Retired Teacher, music  Social Needs  . Financial resource strain: Not on file  . Food insecurity    Worry: Not on file    Inability: Not on file  . Transportation needs    Medical: Not on file    Non-medical: Not on file  Tobacco Use  . Smoking status: Never Smoker  . Smokeless tobacco: Never Used  Substance and Sexual Activity  . Alcohol use: Yes    Alcohol/week: 4.0 standard drinks    Types: 4 Glasses of wine per week    Comment: occasionally  . Drug use: No  . Sexual activity: Not Currently    Partners: Male  Lifestyle  . Physical activity    Days per week: Not on file    Minutes per session: Not on file  . Stress: Not on file  Relationships  . Social Herbalist on phone: Not on file    Gets together: Not on file    Attends religious service: Not on file    Active member of club or organization: Not on file    Attends meetings of clubs or organizations: Not on file    Relationship status: Not on file  Other Topics Concern  . Not on file  Social History Narrative   Lives with husband, no major dietary restrictions, retired from teaching      Husband dx with cancer MDS June 2017.   Currently in remission. (11/03/16 pc)      Pt has one biological child the other 3 are adopted        Family History: The  patient's family history includes Arthritis in her father, sister, sister, and sister; Bipolar disorder in her mother; Cancer in her mother; Depression in her mother and sister; Heart disease in her father; Hyperlipidemia in her father and mother; Hypertension in her father; Mental illness in her mother and sister; Other in her mother; Parkinson's disease in her sister; Thyroid disease in her mother.  There is no history of Colon cancer, Esophageal cancer, Rectal cancer, or Stomach cancer.  ROS:   Please see the history of present illness.   Review of Systems  Constitution: Negative for chills, decreased appetite, malaise/fatigue and night sweats.  HENT: Negative for congestion, nosebleeds and stridor.   Eyes: Negative for blurred vision, photophobia and visual disturbance.  Cardiovascular: Positive for dyspnea on exertion. Negative for chest pain, near-syncope and palpitations.  Respiratory: Negative for cough, sputum production and wheezing.   Endocrine: Negative for cold intolerance.  Hematologic/Lymphatic: Negative for adenopathy.  Skin: Negative for nail changes, poor wound healing and suspicious lesions.  Musculoskeletal: Negative for arthritis, back pain, joint pain and muscle weakness.  Gastrointestinal: Negative for bloating, change in bowel habit, hematemesis and nausea.  Genitourinary: Negative for bladder incontinence, decreased libido, hematuria and non-menstrual bleeding.  Neurological: Negative for aphonia, excessive daytime sleepiness and light-headedness.  Psychiatric/Behavioral: Negative for altered mental status, depression and substance abuse. The patient is not nervous/anxious.   Allergic/Immunologic: Negative for HIV exposure.      EKGs/Labs/Other Studies Reviewed:    The following studies were reviewed today:  EKG:  The ekg ordered today demonstrates sinus rhythm, heart rate 74 bpm, left anterior block with early precordial transition.  Recent Labs: 11/05/2018: ALT 5;  BUN 16; Creatinine, Ser 0.65; Hemoglobin 12.0; Platelets 237.0; Potassium 4.5; Sodium 142; TSH 1.38  Recent Lipid Panel    Component Value Date/Time   CHOL 163 11/05/2018 0953   CHOL 180 11/27/2016 1123   TRIG 87.0 11/05/2018 0953   HDL 64.80 11/05/2018 0953   HDL 57 11/27/2016 1123   CHOLHDL 3 11/05/2018 0953   VLDL 17.4 11/05/2018 0953   LDLCALC 81 11/05/2018 0953   LDLCALC 88 11/27/2016 1123   LDLDIRECT 161.5 10/07/2008 0944    Physical Exam:    VS:  BP 110/80 (BP Location: Left Arm, Patient Position: Sitting, Cuff Size: Normal)   Pulse 73   Temp (!) 95.7 F (35.4 C)   Ht 5\' 4"  (1.626 m)   Wt 245 lb (111.1 kg)   SpO2 95%   BMI 42.05 kg/m     Wt Readings from Last 3 Encounters:  05/15/19 245 lb (111.1 kg)  05/05/19 248 lb 12.8 oz (112.9 kg)  11/05/18 247 lb (112 kg)     GEN:  Well nourished, well developed in no acute distress HEENT: Normal NECK: No JVD; No carotid bruits LYMPHATICS: No lymphadenopathy CARDIAC: S1S2, RRR, no murmurs, rubs, gallops RESPIRATORY:  Clear to auscultation without rales, wheezing or rhonchi  ABDOMEN: Soft, non-tender, non-distended EXTREMITIES: No edema, no cyanosis, No clubbing MUSCULOSKELETAL:  No edema; No deformity  SKIN: Warm and dry NEUROLOGIC:  Alert and oriented x 3, nonfocal PSYCHIATRIC:  Normal affect   ASSESSMENT:    1. Essential hypertension   2. Chest pain, unspecified type   3. Mixed hyperlipidemia   4. Rheumatoid arthritis, involving unspecified site, unspecified rheumatoid factor presence ([place])   5. Diastolic dysfunction   6. Shortness of breath on exertion    PLAN:    In order of problems listed above:  Given her risk factor she is at intermediate risk for CAD, therefore at this time I will pursue ischemic evaluation with a pharmacologic stress test. She has been educated about this and is in agreement.  Her TTE was reviewed and there is evidence of diastolic dysfunction. With her diastolic dysfunction , with  her shortness of breath with bilateral leg edema I will cautiously diurese her. Lasix 20mg   Tu Th and sat as been ordered.  She will follow up in a month.     Disposition:   Medication Adjustments/Labs and Tests Ordered: Current medicines are reviewed at length with the patient today.  Concerns regarding medicines are outlined above.  Orders Placed This Encounter  Procedures  . Basic Metabolic Panel (BMET)  . Magnesium  . MYOCARDIAL PERFUSION IMAGING  . EKG 12-Lead   Meds ordered this encounter  Medications  . furosemide (LASIX) 20 MG tablet    Sig: Take 1 tablet (20 mg total) by mouth daily. On Tues ,Thurs and Satudays only    Dispense:  45 tablet    Refill:  1    Patient Instructions  Medication Instructions:  Your physician has recommended you make the following change in your medication:   START: Furosemide (LASIX) 20mg   Take  1 Tab on Tues,THurs and Saturdays only  If you need a refill on your cardiac medications before your next appointment, please call your pharmacy.   Lab work: Your physician recommends that you return for lab work in: 1 month BMP,Magnesium  If you have labs (blood work) drawn today and your tests are completely normal, you will receive your results only by: Marland Kitchen MyChart Message (if you have MyChart) OR . A paper copy in the mail If you have any lab test that is abnormal or we need to change your treatment, we will call you to review the results.  Testing/Procedures: Your physician has requested that you have a lexiscan myoview. For further information please visit [email or web address]. Please follow instruction sheet, as given.   Follow-Up: At [facility], you and your health needs are our priority.  As part of our continuing mission to provide you with exceptional heart care, we have created designated Provider Care Teams.  These Care Teams include your primary Cardiologist (physician) and Advanced Practice Providers (APPs -  Physician Assistants  and Nurse Practitioners) who all work together to provide you with the care you need, when you need it. You will need a follow up appointment in 1 months with Dr Harriet Masson Any Other Special Instructions Will Be Listed Below (If Applicable).       Rolly Pancake, DO  05/16/2019 9:21 AM    [facility]

## 2019-05-16 ENCOUNTER — Telehealth: Payer: Self-pay | Admitting: Clinical

## 2019-05-16 ENCOUNTER — Encounter: Payer: Self-pay | Admitting: Cardiology

## 2019-05-16 DIAGNOSIS — I5189 Other ill-defined heart diseases: Secondary | ICD-10-CM | POA: Insufficient documentation

## 2019-05-16 NOTE — Telephone Encounter (Signed)
LCSW spoke w pt to schedule BH OV-05/28/19 at 10am

## 2019-05-20 ENCOUNTER — Telehealth (HOSPITAL_COMMUNITY): Payer: Self-pay

## 2019-05-20 NOTE — Telephone Encounter (Signed)
Spoke with the patient, instructions given. She stated that she understood and would be here for her test. Asked to call back with any questions. S.Stevie Ertle EMTP

## 2019-05-21 ENCOUNTER — Encounter (HOSPITAL_COMMUNITY): Payer: Medicare Other

## 2019-05-22 ENCOUNTER — Ambulatory Visit (HOSPITAL_COMMUNITY): Payer: Medicare Other | Attending: Internal Medicine

## 2019-05-22 ENCOUNTER — Other Ambulatory Visit: Payer: Self-pay

## 2019-05-22 VITALS — Ht 64.0 in | Wt 245.0 lb

## 2019-05-22 DIAGNOSIS — R079 Chest pain, unspecified: Secondary | ICD-10-CM | POA: Diagnosis not present

## 2019-05-22 DIAGNOSIS — R0602 Shortness of breath: Secondary | ICD-10-CM | POA: Diagnosis not present

## 2019-05-22 MED ORDER — TECHNETIUM TC 99M TETROFOSMIN IV KIT
32.7000 | PACK | Freq: Once | INTRAVENOUS | Status: AC | PRN
  Administered 2019-05-22: 32.7 via INTRAVENOUS
  Filled 2019-05-22: qty 33

## 2019-05-22 MED ORDER — REGADENOSON 0.4 MG/5ML IV SOLN
0.4000 mg | Freq: Once | INTRAVENOUS | Status: AC
  Administered 2019-05-22: 0.4 mg via INTRAVENOUS

## 2019-05-23 ENCOUNTER — Encounter: Payer: Self-pay | Admitting: Occupational Therapy

## 2019-05-23 ENCOUNTER — Encounter: Payer: Self-pay | Admitting: Physical Therapy

## 2019-05-23 ENCOUNTER — Ambulatory Visit: Payer: Medicare Other | Admitting: Physical Therapy

## 2019-05-23 ENCOUNTER — Ambulatory Visit: Payer: Medicare Other | Admitting: Occupational Therapy

## 2019-05-23 DIAGNOSIS — R2689 Other abnormalities of gait and mobility: Secondary | ICD-10-CM

## 2019-05-23 DIAGNOSIS — R278 Other lack of coordination: Secondary | ICD-10-CM | POA: Diagnosis not present

## 2019-05-23 DIAGNOSIS — R29818 Other symptoms and signs involving the nervous system: Secondary | ICD-10-CM | POA: Diagnosis not present

## 2019-05-23 DIAGNOSIS — R293 Abnormal posture: Secondary | ICD-10-CM

## 2019-05-23 DIAGNOSIS — R41840 Attention and concentration deficit: Secondary | ICD-10-CM | POA: Diagnosis not present

## 2019-05-23 DIAGNOSIS — R2681 Unsteadiness on feet: Secondary | ICD-10-CM

## 2019-05-23 DIAGNOSIS — R29898 Other symptoms and signs involving the musculoskeletal system: Secondary | ICD-10-CM | POA: Diagnosis not present

## 2019-05-23 DIAGNOSIS — R251 Tremor, unspecified: Secondary | ICD-10-CM

## 2019-05-23 NOTE — Therapy (Signed)
[facility] [address] [place], [place], [address] Phone: [phone]   Fax:  [phone]  Occupational Therapy Treatment  Patient Details  Name: Kristin [name] MRN: [id number] Date of Birth: [birth year] Referring Provider (OT): Dr. [name] Tat   Encounter Date: 05/23/2019  OT End of Session - 05/23/19 0724    Visit Number  2    Number of Visits  8    Date for OT Re-Evaluation  07/05/19    Authorization Type  Medicare & [employer], covered 100%    Authorization - Visit Number  2    Authorization - Number of Visits  10    OT Start Time  0720    OT Stop Time  0800    OT Time Calculation (min)  40 min    Activity Tolerance  Patient tolerated treatment well    Behavior During Therapy  [facility] for tasks assessed/performed       Past Medical History:  Diagnosis Date  . Abnormal vaginal Pap smear   . Acute pharyngitis 02/25/2013  . Anemia   . Anxiety   . Arthritis   . 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 Chicken pox as a child  . Chronic UTI    sees dr Terance Hart  . Constipation 12/07/2015  . Depression with anxiety 11/02/2009   Qualifier: Diagnosis of  By: [name], LPN, [name]   . Dermatitis 07/17/2012  . Esophageal stricture 1994  . Fibroids   . Foot pain, bilateral 06/19/2012  . GERD (gastroesophageal reflux disease)   . GERD (gastroesophageal reflux disease)   . Hiatal hernia   . Hyperglycemia 08/19/2013  . 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 Hyperlipidemia   . Hypertension   . Infertility, female   . Low back pain 10/17/2007   Qualifier: Diagnosis of  By: [name], [name]   . Measles as a child  . Obesity   . Osteoarthritis   . Parkinson disease ([place])   . Plantar fasciitis of left foot 06/19/2012  . Preventative health care 12/19/2015  . Rheumatoid arthritis ([place]) 01/08/2018  . Rosacea 10/05/2014  . Swallowing difficulty   . Urinary frequency 02/25/2013  . Visual floaters  05/11/2014    Past Surgical History:  Procedure Laterality Date  . ABDOMINAL HYSTERECTOMY  2006   total  . esophageal     stretching  . laporoscopy    . TONSILLECTOMY    . TOTAL HIP ARTHROPLASTY Right 2010  . wisdom teeth extracted      There were no vitals filed for this visit.  Subjective Assessment - 05/23/19 0723    Subjective   Husband had a stroke on labor day.  Affected eyes, hospitalized 1 night for observation, but symptoms resolved    Pertinent History  Parkinson's Disease.    PMH:  RA, depression, anxiety, HTN, OA, GERD, prediabetes, vitamin D deficiency, basal cell carcinoma of skin, hyperhydrosis, hyperlipidemia    Patient Stated Goals  improve R hand coordination/use, improve ability to don earrings    Currently in Pain?  No/denies        Began reviewing activities from coordination HEP (from previous updates):  Flipping cards, dealing cards with thumb, rotating ball in fingertips with min cueing for technique/incr movement amplitude, pt demo incr difficulty/cues for bradykinesia with divided attention/dual task (conversation).  Discussed timing of medication and difficulty with donning jewelry.  Recommended pt bring jewelry with her to practice next session.  Recommended waiting before donning to see if it is easier once meds have time to begin to work.    Practiced opening water bottle with R hand with min cueing for large amplitude movement techniques.     OT Education - 05/23/19 1140    Education Details  PWR! Moves in modified quadruped with hands on seat of chair due to knee pain--pt returned demo each    Person(s) Educated  Patient    Methods  Explanation;Demonstration;Verbal cues   min v.c. for incr movement amplitude/technique   Comprehension  Verbalized understanding;Returned demonstration;Verbal cues required       OT Short Term Goals - 05/06/19 0921      OT SHORT TERM GOAL #1   Title  Pt will be independent with updated HEP.--check STGs 06/05/19     Time  4    Period  Weeks    Status  New      OT SHORT TERM GOAL #2   Title  Pt will be able to don/doff earrings using AE prn.    Time  4    Period  Weeks    Status  New        OT Long Term Goals - 05/06/19 [id number]      OT LONG TERM GOAL #1   Title  Pt will verbalize understanding of adaptive stratgies to incr. ease/efficiency with ADLs/IADLs--check LTGs 07/05/19    Time  8    Period  Weeks    Status  New      OT LONG TERM GOAL #2   Title  Pt will improve R hand coordination for ADLs as shown by improving time on 9-hole peg test by at least 7sec.    Baseline  30.91sec    Time  8    Period  Weeks    Status  New      OT LONG TERM GOAL #3   Title  Pt will improve functional reaching/coordination for ADLs as shown by improving score on box and blocks test by at least 7 blocks with RUE.    Baseline  R-53 blocks    Time  8    Period  Weeks    Status  New      OT LONG TERM GOAL #4   Title  ---      OT LONG TERM GOAL #5   Title  ---            Plan - 05/23/19 [id number]    Clinical Impression Statement  Pt is progressing towards goals with greater understanding of activity modifications for ADLs/IADLs and updates to HEP.    OT Occupational Profile and History  Detailed Assessment- Review of Records and additional review of physical, cognitive, psychosocial history related to current functional performance    Occupational performance deficits (Please refer to evaluation for details):  ADL's;IADL's;Leisure;Social Participation    Body Structure / Function / Physical Skills  ADL;Dexterity;ROM;IADL;Balance;Mobility;Tone;FMC;Coordination;Decreased knowledge of use of DME;UE functional use;GMC    Rehab Potential  Good    Clinical Decision Making  Limited treatment options, no task modification necessary    Comorbidities Affecting Occupational Performance:  May have comorbidities impacting occupational performance    Modification or Assistance to Complete Evaluation   Min-Moderate  modification of tasks or assist with assess necessary to complete eval    OT Frequency  1x / week    OT Duration  8 weeks   +eval, but likely 6 weeks   OT Treatment/Interventions  Self-care/ADL training;Moist Heat;Fluidtherapy;DME  and/or AE instruction;Therapeutic activities;Therapeutic exercise;Cognitive remediation/compensation;Passive range of motion;Functional Mobility Training;Neuromuscular education;Cryotherapy;Paraffin;Energy conservation;Manual Therapy;Patient/family education    Plan  review coordination HEP, functional reaching    Consulted and Agree with Plan of Care  Patient       Patient will benefit from skilled therapeutic intervention in order to improve the following deficits and impairments:   Body Structure / Function / Physical Skills: ADL, Dexterity, ROM, IADL, Balance, Mobility, Tone, FMC, Coordination, Decreased knowledge of use of DME, UE functional use, GMC       Visit Diagnosis: Other symptoms and signs involving the nervous system  Other symptoms and signs involving the musculoskeletal system  Other lack of coordination  Tremor  Abnormal posture  Unsteadiness on feet  Other abnormalities of gait and mobility  Attention and concentration deficit    Problem List Patient Active Problem List   Diagnosis Date Noted  . Diastolic dysfunction [id number]  . Cough 05/10/2018  . Rheumatoid arthritis ([place]) 01/08/2018  . Hand pain, right 10/15/2017  . Vitamin D deficiency 12/20/2016  . Prediabetes 12/20/2016  . Shortness of breath on exertion 11/27/2016  . Preventative health care 12/19/2015  . Constipation 12/07/2015  . BCC (basal cell carcinoma of skin) 10/05/2014  . Rosacea 10/05/2014  . Parkinson's disease ([place]) 05/11/2014  . Visual floaters 05/11/2014  . Paralysis agitans ([place]) 01/08/2014  . Screening for cervical cancer 07/17/2012  . Foot pain, bilateral 06/19/2012  . 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 Obesity 11/12/2009  . Depression with  anxiety 11/02/2009  . 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 Lumbar back pain with radiculopathy affecting lower extremity 10/17/2007  . Essential hypertension 07/05/2007  . Osteoarthritis 07/05/2007  . Hyperlipidemia 06/26/2007  . H/O: iron deficiency anemia 05/08/2007  . GERD 05/08/2007    [facility] 05/23/2019, 11:42 AM  [facility] [address] [place], [place], [address] Phone: [phone]   Fax:  [phone]  Name: Kristin [name] MRN: [id number] Date of Birth: [birth year]   Vianne Bulls, OTR/L [facility] [address] [place], [place]  [address] [phone] phone [phone] 05/23/19 11:42 AM

## 2019-05-23 NOTE — Therapy (Signed)
[facility] [address] [place], [place], [address] Phone: [phone]   Fax:  [phone]  Physical Therapy Treatment  Patient Details  Name: Kristin [name] MRN: [id number] Date of Birth: Apr 11, 1951 Referring Provider (PT): Tat, [name]   Encounter Date: 05/23/2019  PT End of Session - 05/23/19 1151    Visit Number  3    Number of Visits  5    Date for PT Re-Evaluation  07/04/19    Authorization Type  Medicare/[employer]; Covered 100%    PT Start Time  0804    PT Stop Time  0843    PT Time Calculation (min)  39 min    Activity Tolerance  Patient tolerated treatment well    Behavior During Therapy  [facility] for tasks assessed/performed       Past Medical History:  Diagnosis Date  . Abnormal vaginal Pap smear   . Acute pharyngitis 02/25/2013  . Anemia   . Anxiety   . Arthritis   . 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 Chicken pox as a child  . Chronic UTI    sees dr Terance Hart  . Constipation 12/07/2015  . Depression with anxiety 11/02/2009   Qualifier: Diagnosis of  By: [name], LPN, [name]   . Dermatitis 07/17/2012  . Esophageal stricture 1994  . Fibroids   . Foot pain, bilateral 06/19/2012  . GERD (gastroesophageal reflux disease)   . GERD (gastroesophageal reflux disease)   . Hiatal hernia   . Hyperglycemia 08/19/2013  . 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 Hyperlipidemia   . Hypertension   . Infertility, female   . Low back pain 10/17/2007   Qualifier: Diagnosis of  By: [name], [name]   . Measles as a child  . Obesity   . Osteoarthritis   . Parkinson disease ([place])   . Plantar fasciitis of left foot 06/19/2012  . Preventative health care 12/19/2015  . Rheumatoid arthritis ([place]) 01/08/2018  . Rosacea 10/05/2014  . Swallowing difficulty   . Urinary frequency 02/25/2013  . Visual floaters 05/11/2014    Past Surgical History:  Procedure Laterality Date  . ABDOMINAL HYSTERECTOMY   2006   total  . esophageal     stretching  . laporoscopy    . TONSILLECTOMY    . TOTAL HIP ARTHROPLASTY Right 2010  . wisdom teeth extracted      There were no vitals filed for this visit.  Subjective Assessment - 05/23/19 0806    Subjective  Saw the cardiologist last week and initiated stress test yesterday, but have to finish.  Things look okay so far    Pertinent History  RA (knee pain)    Patient Stated Goals  Pt's goals for therapy are to improve strength.    Currently in Pain?  No/denies                       [facility] Adult PT Treatment/Exercise - 05/23/19 0001      Self-Care   Self-Care  Other Self-Care Comments    Other Self-Care Comments   )2 sats end of session: 98%. Pt has had follow up with MD and is in process of having stress test; no restrictions/precautions per pt report.  Pt reports having no issues with the walking program, 2-3 bouts of 3 minutes of walking within a 10 minute period, while walking at home.  PT encouraged pt to continue performing walking  program as tolerated at home, as previously instructed.        [facility]) - 05/23/19 [id number]    PWR! exercises  Moves in standing    PWR! Up  x 20 reps   cues to hinge at hips, then gentle squat (due to L knee pain   PWR! Rock  x 20 reps (10 on each side)    PWR! Twist  x 10 reps each side    PWR Step  x 10 reps each side    Comments  Pt performs PWR! Moves in standing-as part of HEP, and PT provides occasional cues for technique/amplitude of movement       Balance Exercises - 05/23/19 0829      Balance Exercises: Standing   Wall Bumps  Hip    Wall Bumps-Hips  Anterior/posterior;10 reps   2 sets   Heel Raises Limitations  2 sets x 10 reps    Toe Raise Limitations  2 sets x 10 reps    Other Standing Exercises  Stagger stance forward/back rocking 2 sets x 10 reps, with  Intermittent UE support     additional reps for cues for optimal technique throughout exercises performed today  PT  Education - 05/23/19 0849    Education Details  Added to HEP-see instructions    Person(s) Educated  Patient    Methods  Explanation;Demonstration;Handout    Comprehension  Verbalized understanding;Returned demonstration          PT Long Term Goals - 05/05/19 1246      PT LONG TERM GOAL #1   Title  Pt will be independent with HEP for improved functional mobility, strength, balance, PD-specific deficits.  TARGET 06/06/2019 (may be delayed due to delayed start coordinating PT, OT)    Time  4    Period  Weeks    Status  New    Target Date  06/06/19      PT LONG TERM GOAL #2   Title  Pt will improve 5x sit<>stand to less than or equal to 10 seconds for improved functional strength.    Time  4    Period  Weeks    Status  New    Target Date  06/06/19      PT LONG TERM GOAL #3   Title  Pt will improve 3 MWT distance to at least 575 ft for improved gait efficiency and safety for community walking.    Time  4    Period  Weeks    Status  New    Target Date  06/06/19      PT LONG TERM GOAL #4   Title  Pt will verbalize understanding of appropriate ongoing community fitness upon d/c from PT.    Time  4    Period  Weeks    Status  New    Target Date  06/06/19            Plan - 05/23/19 1152    Clinical Impression Statement  Focused today's session more on balance activiteis, PWR! Moves in standing, with no dyspnea on exertion noted in session.  Per pt report, things look good from cardiologist perspective and she is awaiting completion of medication-induced stress test.  She will continue to benefit from skilled PT to work on balance, gait as able.    Personal Factors and Comorbidities  Comorbidity 3+    Comorbidities  RA, anxiety, depression, HTN, hx of back pain    Examination-Activity Limitations  Transfers;Locomotion Level  Examination-Participation Restrictions  Community Activity   Community fitness   Stability/Clinical Decision Making  Evolving/Moderate complexity     Rehab Potential  Good    PT Frequency  1x / week    PT Duration  4 weeks   plus eva   PT Treatment/Interventions  ADLs/Self Care Home Management;Gait training;Neuromuscular re-education;Balance training;Therapeutic activities;Functional mobility training;Patient/family education    PT Next Visit Plan  OT has worked on modified quadruped PWR! Moves, so please review standing PWR! Moves (with cues for positioning with PWR! Up to avoid knee pain); review stagger stance rocking; work on Research officer, political party and Agree with Plan of Care  Patient       Patient will benefit from skilled therapeutic intervention in order to improve the following deficits and impairments:  Abnormal gait, Decreased activity tolerance, Decreased balance, Decreased mobility, Difficulty walking, Decreased strength, Postural dysfunction  Visit Diagnosis: Unsteadiness on feet     Problem List Patient Active Problem List   Diagnosis Date Noted  . Diastolic dysfunction [id number]  . Cough 05/10/2018  . Rheumatoid arthritis ([place]) 01/08/2018  . Hand pain, right 10/15/2017  . Vitamin D deficiency 12/20/2016  . Prediabetes 12/20/2016  . Shortness of breath on exertion 11/27/2016  . Preventative health care 12/19/2015  . Constipation 12/07/2015  . BCC (basal cell carcinoma of skin) 10/05/2014  . Rosacea 10/05/2014  . Parkinson's disease ([place]) 05/11/2014  . Visual floaters 05/11/2014  . Paralysis agitans ([place]) 01/08/2014  . Screening for cervical cancer 07/17/2012  . Foot pain, bilateral 06/19/2012  . 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 Obesity 11/12/2009  . Depression with anxiety 11/02/2009  . 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 Lumbar back pain with radiculopathy affecting lower extremity 10/17/2007  . Essential hypertension 07/05/2007  . Osteoarthritis 07/05/2007  . Hyperlipidemia 06/26/2007  . H/O: iron deficiency anemia 05/08/2007  . GERD 05/08/2007    Lucy Boardman  W. 05/23/2019, 11:56 AM  Frazier Butt., PT   [facility] [address] [place], [place], [address] Phone: [phone]   Fax:  [phone]  Name: BAILEY SPELLS MRN: [id number] Date of Birth: 1951-03-26

## 2019-05-23 NOTE — Patient Instructions (Addendum)
 *  For PWR! UP:  Cues for hinge at hips, then gentle squat, which does not cause/aggravate L knee pain  -Also performed stagger stance forward/back rocking, 2 sets x 10 reps, once per day

## 2019-05-26 ENCOUNTER — Ambulatory Visit (HOSPITAL_COMMUNITY): Payer: Medicare Other | Attending: Cardiology

## 2019-05-26 ENCOUNTER — Other Ambulatory Visit: Payer: Self-pay

## 2019-05-26 DIAGNOSIS — Z79899 Other long term (current) drug therapy: Secondary | ICD-10-CM | POA: Diagnosis not present

## 2019-05-26 DIAGNOSIS — H524 Presbyopia: Secondary | ICD-10-CM | POA: Diagnosis not present

## 2019-05-26 DIAGNOSIS — H5202 Hypermetropia, left eye: Secondary | ICD-10-CM | POA: Diagnosis not present

## 2019-05-26 DIAGNOSIS — H2513 Age-related nuclear cataract, bilateral: Secondary | ICD-10-CM | POA: Diagnosis not present

## 2019-05-26 LAB — MYOCARDIAL PERFUSION IMAGING
LV dias vol: 76 mL (ref 46–106)
LV sys vol: 25 mL
Peak HR: 76 {beats}/min
Rest HR: 71 {beats}/min
SDS: 3
SRS: 5
SSS: 8
TID: 1.01

## 2019-05-26 MED ORDER — TECHNETIUM TC 99M TETROFOSMIN IV KIT
32.3000 | PACK | Freq: Once | INTRAVENOUS | Status: AC | PRN
  Administered 2019-05-26: 32.3 via INTRAVENOUS
  Filled 2019-05-26: qty 33

## 2019-05-27 ENCOUNTER — Encounter: Payer: Self-pay | Admitting: Occupational Therapy

## 2019-05-27 ENCOUNTER — Ambulatory Visit: Payer: Medicare Other | Admitting: Physical Therapy

## 2019-05-27 ENCOUNTER — Telehealth: Payer: Self-pay | Admitting: *Deleted

## 2019-05-27 ENCOUNTER — Ambulatory Visit: Payer: Medicare Other | Admitting: Occupational Therapy

## 2019-05-27 DIAGNOSIS — R29818 Other symptoms and signs involving the nervous system: Secondary | ICD-10-CM

## 2019-05-27 DIAGNOSIS — R293 Abnormal posture: Secondary | ICD-10-CM | POA: Diagnosis not present

## 2019-05-27 DIAGNOSIS — R2689 Other abnormalities of gait and mobility: Secondary | ICD-10-CM

## 2019-05-27 DIAGNOSIS — R278 Other lack of coordination: Secondary | ICD-10-CM

## 2019-05-27 DIAGNOSIS — R29898 Other symptoms and signs involving the musculoskeletal system: Secondary | ICD-10-CM | POA: Diagnosis not present

## 2019-05-27 DIAGNOSIS — R41840 Attention and concentration deficit: Secondary | ICD-10-CM

## 2019-05-27 DIAGNOSIS — R251 Tremor, unspecified: Secondary | ICD-10-CM | POA: Diagnosis not present

## 2019-05-27 DIAGNOSIS — R2681 Unsteadiness on feet: Secondary | ICD-10-CM

## 2019-05-27 NOTE — Therapy (Signed)
[facility] [address] [place], [place], [address] Phone: [phone]   Fax:  [phone]  Physical Therapy Treatment  Patient Details  Name: Kristin [name] MRN: [id number] Date of Birth: 05-Sep-1950 Referring Provider (PT): Tat, [name]   Encounter Date: 05/27/2019  PT End of Session - 05/27/19 0900    Visit Number  4    Number of Visits  5    Date for PT Re-Evaluation  07/04/19    Authorization Type  Medicare/[employer]; Covered 100%    PT Start Time  0803    PT Stop Time  0848    PT Time Calculation (min)  45 min    Activity Tolerance  Patient tolerated treatment well    Behavior During Therapy  [facility] for tasks assessed/performed       Past Medical History:  Diagnosis Date  . Abnormal vaginal Pap smear   . Acute pharyngitis 02/25/2013  . Anemia   . Anxiety   . Arthritis   . 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 Chicken pox as a child  . Chronic UTI    sees dr Terance Hart  . Constipation 12/07/2015  . Depression with anxiety 11/02/2009   Qualifier: Diagnosis of  By: [name], LPN, [name]   . Dermatitis 07/17/2012  . Esophageal stricture 1994  . Fibroids   . Foot pain, bilateral 06/19/2012  . GERD (gastroesophageal reflux disease)   . GERD (gastroesophageal reflux disease)   . Hiatal hernia   . Hyperglycemia 08/19/2013  . 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 Hyperlipidemia   . Hypertension   . Infertility, female   . Low back pain 10/17/2007   Qualifier: Diagnosis of  By: [name], [name]   . Measles as a child  . Obesity   . Osteoarthritis   . Parkinson disease ([place])   . Plantar fasciitis of left foot 06/19/2012  . Preventative health care 12/19/2015  . Rheumatoid arthritis ([place]) 01/08/2018  . Rosacea 10/05/2014  . Swallowing difficulty   . Urinary frequency 02/25/2013  . Visual floaters 05/11/2014    Past Surgical History:  Procedure Laterality Date  . ABDOMINAL HYSTERECTOMY   2006   total  . esophageal     stretching  . laporoscopy    . TONSILLECTOMY    . TOTAL HIP ARTHROPLASTY Right 2010  . wisdom teeth extracted      There were no vitals filed for this visit.  Subjective Assessment - 05/27/19 0806    Subjective  Finished the stress test yesterday.  When I do the exercises by myself, I do okay, it's when I'm around people that I get more nervous.    Pertinent History  RA (knee pain)    Patient Stated Goals  Pt's goals for therapy are to improve strength.    Currently in Pain?  Yes    Pain Score  5     Pain Location  Knee    Pain Orientation  Left    Pain Descriptors / Indicators  Aching    Pain Frequency  Intermittent    Aggravating Factors   going up and down steps    Pain Relieving Factors  sitting    Multiple Pain Sites  No                       [facility] Adult PT Treatment/Exercise - 05/27/19 0001      Self-Care   Self-Care  Other Self-Care Comments    Other Self-Care Comments   Discussed pt's perceived increased SOB and exertion in busier situations and with people around.  Discussed PD-related non motor symptoms and when they may change/increase/affect pt's mobility and exercise potential.  Discussed speaking to MD or to counselor/therapist (pt has seen therapist in the past and has scheduled an upcoming visit).  Discussed breathing techniques.  Also discussed and provided exercise chart        [facility]) - 05/27/19 [id number]    PWR! exercises  Moves in standing    PWR! Up  x 10 reps (review of positioning for decreased knee pain)       Balance Exercises - 05/27/19 0823      Balance Exercises: Standing   Standing Eyes Opened  Wide (BOA);5 reps;Head turns;Narrow base of support (BOS);Solid surface;Foam/compliant surface   Head nods   Standing Eyes Closed  Wide (BOA);Solid surface;Head turns;5 reps;Narrow base of support (BOS);Foam/compliant surface   Head nods; EC 10 seconds head steady   SLS with Vectors  Solid  surface;Intermittent upper extremity assist;Other reps (comment)   15 reps alternating step taps to 4" block   Balance Beam  On balance beam, marching in place x 10, alternating forward kicks x 10 reps; then side step and weightshift x 10 reps each side with intermittent UE support    Partial Tandem Stance  Eyes open;5 reps;Eyes closed;Foam/compliant surface   Solid surface; Head turns, head nods, then EC 10 seconds    Other Standing Exercises  Stagger stance forward/back rocking 2 sets x 10 reps; review of HEP given last visit   3rd set with coordinated arm swing     At 4" aerobic step:  Step up,up/down, down x 10 reps each leg leading, with intermittent UE support.   PT Education - 05/27/19 0859    Education Details  Exercise chart; ways to follow up regarding pt's SOB (with MD, with therapist if pt feels it is anxiety-related, breathing techniques)    Person(s) Educated  Patient    Methods  Explanation;Verbal cues;Handout    Comprehension  Verbalized understanding          PT Long Term Goals - 05/05/19 1246      PT LONG TERM GOAL #1   Title  Pt will be independent with HEP for improved functional mobility, strength, balance, PD-specific deficits.  TARGET 06/06/2019 (may be delayed due to delayed start coordinating PT, OT)    Time  4    Period  Weeks    Status  New    Target Date  06/06/19      PT LONG TERM GOAL #2   Title  Pt will improve 5x sit<>stand to less than or equal to 10 seconds for improved functional strength.    Time  4    Period  Weeks    Status  New    Target Date  06/06/19      PT LONG TERM GOAL #3   Title  Pt will improve 3 MWT distance to at least 575 ft for improved gait efficiency and safety for community walking.    Time  4    Period  Weeks    Status  New    Target Date  06/06/19      PT LONG TERM GOAL #4   Title  Pt will verbalize understanding of appropriate ongoing community fitness upon d/c from PT.    Time  4    Period  Weeks  Status  New     Target Date  06/06/19            Plan - 05/27/19 0901    Clinical Impression Statement  Focused on review of additions to HEP last visit as well as balance and compliant surface activities.  Pt able to perform without UE support for most activities, but does keep hands close to support.  Pt will continue to benefit from skilled PT to solidify pt's HEP and ongoing exercise program.    Personal Factors and Comorbidities  Comorbidity 3+    Comorbidities  RA, anxiety, depression, HTN, hx of back pain    Examination-Activity Limitations  Transfers;Locomotion Level    Examination-Participation Restrictions  Community Activity   Community fitness   Stability/Clinical Decision Making  Evolving/Moderate complexity    Rehab Potential  Good    PT Frequency  1x / week    PT Duration  4 weeks   plus eva   PT Treatment/Interventions  ADLs/Self Care Home Management;Gait training;Neuromuscular re-education;Balance training;Therapeutic activities;Functional mobility training;Patient/family education    PT Next Visit Plan  Corner balance exercises if needed; review exercise chart; check LTGs and plan for d/c    Consulted and Agree with Plan of Care  Patient       Patient will benefit from skilled therapeutic intervention in order to improve the following deficits and impairments:  Abnormal gait, Decreased activity tolerance, Decreased balance, Decreased mobility, Difficulty walking, Decreased strength, Postural dysfunction  Visit Diagnosis: Unsteadiness on feet     Problem List Patient Active Problem List   Diagnosis Date Noted  . Diastolic dysfunction Q000111Q  . Cough 05/10/2018  . Rheumatoid arthritis ([place]) 01/08/2018  . Hand pain, right 10/15/2017  . Vitamin D deficiency 12/20/2016  . Prediabetes 12/20/2016  . Shortness of breath on exertion 11/27/2016  . Preventative health care 12/19/2015  . Constipation 12/07/2015  . BCC (basal cell carcinoma of skin) 10/05/2014  . Rosacea  10/05/2014  . Parkinson's disease ([place]) 05/11/2014  . Visual floaters 05/11/2014  . Paralysis agitans ([place]) 01/08/2014  . Screening for cervical cancer 07/17/2012  . Foot pain, bilateral 06/19/2012  . 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 Obesity 11/12/2009  . Depression with anxiety 11/02/2009  . 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 Lumbar back pain with radiculopathy affecting lower extremity 10/17/2007  . Essential hypertension 07/05/2007  . Osteoarthritis 07/05/2007  . Hyperlipidemia 06/26/2007  . H/O: iron deficiency anemia 05/08/2007  . GERD 05/08/2007    Madsen Riddle W. 05/27/2019, 9:04 AM  Frazier Butt., PT   [facility] [address] [place], [place], [address] Phone: [phone]   Fax:  [phone]  Name: AVAMARIE TOSCANO MRN: [id number] Date of Birth: 1950-12-02

## 2019-05-27 NOTE — Therapy (Signed)
[facility] [address] [place], [place], [address] Phone: [phone]   Fax:  [phone]  Occupational Therapy Treatment  Patient Details  Name: Kristin [name] MRN: [id number] Date of Birth: 18-Jun-1951 Referring Provider (OT): Dr. [name] Tat   Encounter Date: 05/27/2019  OT End of Session - 05/27/19 0855    Visit Number  3    Number of Visits  8    Date for OT Re-Evaluation  07/05/19    Authorization Type  Medicare & [employer], covered 100%    Authorization - Visit Number  3    Authorization - Number of Visits  10    OT Start Time  [phone]    OT Stop Time  0930    OT Time Calculation (min)  39 min    Activity Tolerance  Patient tolerated treatment well    Behavior During Therapy  [facility] for tasks assessed/performed       Past Medical History:  Diagnosis Date  . Abnormal vaginal Pap smear   . Acute pharyngitis 02/25/2013  . Anemia   . Anxiety   . Arthritis   . 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 Chicken pox as a child  . Chronic UTI    sees dr Terance Hart  . Constipation 12/07/2015  . Depression with anxiety 11/02/2009   Qualifier: Diagnosis of  By: [name], LPN, [name]   . Dermatitis 07/17/2012  . Esophageal stricture 1994  . Fibroids   . Foot pain, bilateral 06/19/2012  . GERD (gastroesophageal reflux disease)   . GERD (gastroesophageal reflux disease)   . Hiatal hernia   . Hyperglycemia 08/19/2013  . 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 Hyperlipidemia   . Hypertension   . Infertility, female   . Low back pain 10/17/2007   Qualifier: Diagnosis of  By: [name], [name]   . Measles as a child  . Obesity   . Osteoarthritis   . Parkinson disease ([place])   . Plantar fasciitis of left foot 06/19/2012  . Preventative health care 12/19/2015  . Rheumatoid arthritis ([place]) 01/08/2018  . Rosacea 10/05/2014  . Swallowing difficulty   . Urinary frequency 02/25/2013  . Visual floaters  05/11/2014    Past Surgical History:  Procedure Laterality Date  . ABDOMINAL HYSTERECTOMY  2006   total  . esophageal     stretching  . laporoscopy    . TONSILLECTOMY    . TOTAL HIP ARTHROPLASTY Right 2010  . wisdom teeth extracted      There were no vitals filed for this visit.  Subjective Assessment - 05/27/19 0855    Subjective   Pt reports that she normally has difficulty with earrings.  I must be having a good day.    Pertinent History  Parkinson's Disease.    PMH:  RA, depression, anxiety, HTN, OA, GERD, prediabetes, vitamin D deficiency, basal cell carcinoma of skin, hyperhydrosis, hyperlipidemia    Patient Stated Goals  improve R hand coordination/use, improve ability to don earrings    Currently in Pain?  No/denies        Practiced donning/doffing earrings.  Pt able to don a variety of earrings mod I.  Recommended pt stabilize elbows on counter or against body and continue PWR! Hands if difficulty.  Also, recommended pt perform tasks after medication turns "on" as this timing is typically earlier.   Pt reports incr errors with typing.  Typing Test with 39wpm and 92%  accuracy and then 36wpm with 86% accuracy.  Pt issued loop velcro to place on home keys to help with hand placement.  Also, recommended and practiced typing with PWR! Hands between sentences and in natural "phrases" when playing piano or with grasp/release between repetitive reaching tasks.       OT Short Term Goals - 05/06/19 0921      OT SHORT TERM GOAL #1   Title  Pt will be independent with updated HEP.--check STGs 06/05/19    Time  4    Period  Weeks    Status  New      OT SHORT TERM GOAL #2   Title  Pt will be able to don/doff earrings using AE prn.    Time  4    Period  Weeks    Status  New        OT Long Term Goals - 05/06/19 [id number]      OT LONG TERM GOAL #1   Title  Pt will verbalize understanding of adaptive stratgies to incr. ease/efficiency with ADLs/IADLs--check LTGs 07/05/19    Time   8    Period  Weeks    Status  New      OT LONG TERM GOAL #2   Title  Pt will improve R hand coordination for ADLs as shown by improving time on 9-hole peg test by at least 7sec.    Baseline  30.91sec    Time  8    Period  Weeks    Status  New      OT LONG TERM GOAL #3   Title  Pt will improve functional reaching/coordination for ADLs as shown by improving score on box and blocks test by at least 7 blocks with RUE.    Baseline  R-53 blocks    Time  8    Period  Weeks    Status  New      OT LONG TERM GOAL #4   Title  ---      OT LONG TERM GOAL #5   Title  ---            Plan - 05/27/19 [id number]    Clinical Impression Statement  Pt is progressing towards goals as she is able to demo donning/doffing earrings mod I.  Pt is progressing with understanding of updated strategies for ADLs/IADLs.    OT Occupational Profile and History  Detailed Assessment- Review of Records and additional review of physical, cognitive, psychosocial history related to current functional performance    Occupational performance deficits (Please refer to evaluation for details):  ADL's;IADL's;Leisure;Social Participation    Body Structure / Function / Physical Skills  ADL;Dexterity;ROM;IADL;Balance;Mobility;Tone;FMC;Coordination;Decreased knowledge of use of DME;UE functional use;GMC    Rehab Potential  Good    Clinical Decision Making  Limited treatment options, no task modification necessary    Comorbidities Affecting Occupational Performance:  May have comorbidities impacting occupational performance    Modification or Assistance to Complete Evaluation   Min-Moderate modification of tasks or assist with assess necessary to complete eval    OT Frequency  1x / week    OT Duration  8 weeks   +eval, but likely 6 weeks   OT Treatment/Interventions  Self-care/ADL training;Moist Heat;Fluidtherapy;DME and/or AE instruction;Therapeutic activities;Therapeutic exercise;Cognitive remediation/compensation;Passive  range of motion;Functional Mobility Training;Neuromuscular education;Cryotherapy;Paraffin;Energy conservation;Manual Therapy;Patient/family education    Plan  review coordination HEP prn, functional reaching, check on typing    Consulted and Agree with Plan of Care  Patient  Patient will benefit from skilled therapeutic intervention in order to improve the following deficits and impairments:   Body Structure / Function / Physical Skills: ADL, Dexterity, ROM, IADL, Balance, Mobility, Tone, FMC, Coordination, Decreased knowledge of use of DME, UE functional use, GMC       Visit Diagnosis: Other symptoms and signs involving the nervous system  Other symptoms and signs involving the musculoskeletal system  Other lack of coordination  Tremor  Abnormal posture  Unsteadiness on feet  Other abnormalities of gait and mobility  Attention and concentration deficit    Problem List Patient Active Problem List   Diagnosis Date Noted  . Diastolic dysfunction [id number]  . Cough 05/10/2018  . Rheumatoid arthritis ([place]) 01/08/2018  . Hand pain, right 10/15/2017  . Vitamin D deficiency 12/20/2016  . Prediabetes 12/20/2016  . Shortness of breath on exertion 11/27/2016  . Preventative health care 12/19/2015  . Constipation 12/07/2015  . BCC (basal cell carcinoma of skin) 10/05/2014  . Rosacea 10/05/2014  . Parkinson's disease ([place]) 05/11/2014  . Visual floaters 05/11/2014  . Paralysis agitans ([place]) 01/08/2014  . Screening for cervical cancer 07/17/2012  . Foot pain, bilateral 06/19/2012  . 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 Obesity 11/12/2009  . Depression with anxiety 11/02/2009  . 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 Lumbar back pain with radiculopathy affecting lower extremity 10/17/2007  . Essential hypertension 07/05/2007  . Osteoarthritis 07/05/2007  . Hyperlipidemia 06/26/2007  . H/O: iron deficiency anemia 05/08/2007  . GERD 05/08/2007     [facility] 05/27/2019, 3:03 PM  [facility] [address] [place], [place], [address] Phone: [phone]   Fax:  [phone]  Name: Kristin [name] MRN: [id number] Date of Birth: [birth year]   Vianne Bulls, OTR/L [facility] [address] [place], [place]  [address] [phone] phone [phone] 05/27/19 3:12 PM

## 2019-05-27 NOTE — Telephone Encounter (Signed)
-----   Message from Berniece Salines, DO sent at 05/27/2019  3:06 PM EDT ----- Cecille Rubin please let patient know that her stress test was abnormal.  Given her symptoms we will proceed with cardiac catheterization.  Also please ask her if she is getting any relief from the Lasix.

## 2019-05-27 NOTE — Patient Instructions (Signed)
(  Exercise) Monday Tuesday Wednesday Thursday Friday Saturday Sunday    PWR! Moves Standing            PWR! Moves (Modified [place])           PWR! Moves-Floor             Walking           Bike

## 2019-05-27 NOTE — Telephone Encounter (Signed)
Telephone call to patient. Left message left to return call re: stress test results.

## 2019-05-28 ENCOUNTER — Encounter: Payer: Self-pay | Admitting: Family Medicine

## 2019-05-28 ENCOUNTER — Other Ambulatory Visit: Payer: Self-pay | Admitting: Cardiology

## 2019-05-28 ENCOUNTER — Institutional Professional Consult (permissible substitution): Payer: Medicare Other | Admitting: Clinical

## 2019-05-28 ENCOUNTER — Telehealth: Payer: Self-pay | Admitting: *Deleted

## 2019-05-28 ENCOUNTER — Encounter: Payer: Self-pay | Admitting: *Deleted

## 2019-05-28 DIAGNOSIS — R9439 Abnormal result of other cardiovascular function study: Secondary | ICD-10-CM

## 2019-05-28 NOTE — Progress Notes (Signed)
The patient stress test was report as abnormal with ischemia. In the setting of her symptoms  And abnormal stress test - it is appropriate for further ischemic evaluation with a LHC.    Nuclear stress EF: 67%.  There was no ST segment deviation noted during stress.  Defect 1: There is a small defect of moderate severity present in the mid anteroseptal, mid inferoseptal, apical septal and apex location.  Findings consistent with ischemia.  This is an intermediate risk study.  The left ventricular ejection fraction is hyperdynamic (>65%).  No prior study for comparison.  The patient was notified about the results, she was educated on the left heart catheterization.  She does not have any contrast allergy.  She is willing to move on with the testing.

## 2019-05-28 NOTE — Telephone Encounter (Signed)
Telephone call to patient. Informed of stress test results and need for cardiac cath. Set up for Mon Sept 28 at [facility] at 7:00AM. Instructions printed and faxed to [facility] office for patient to pick up today. Verbally went over instructions over the phone. Patient verbalized undestanding.

## 2019-05-28 NOTE — Telephone Encounter (Signed)
Telephone call to patient. Informed of pre cath Covid  Screen at the [facility] at 2:15PM at [place]. Patient verbalized understanding.

## 2019-05-29 ENCOUNTER — Other Ambulatory Visit (HOSPITAL_COMMUNITY)
Admission: RE | Admit: 2019-05-29 | Discharge: 2019-05-29 | Disposition: A | Discharge status: Home / Self Care | Payer: Medicare Other | Source: Ambulatory Visit | Attending: Interventional Cardiology | Admitting: Interventional Cardiology

## 2019-05-29 ENCOUNTER — Other Ambulatory Visit: Payer: Self-pay

## 2019-05-29 ENCOUNTER — Telehealth: Payer: Self-pay | Admitting: *Deleted

## 2019-05-29 ENCOUNTER — Ambulatory Visit (INDEPENDENT_AMBULATORY_CARE_PROVIDER_SITE_OTHER): Payer: Medicare Other | Admitting: Clinical

## 2019-05-29 ENCOUNTER — Other Ambulatory Visit: Payer: Self-pay | Admitting: Neurology

## 2019-05-29 ENCOUNTER — Other Ambulatory Visit: Payer: Medicare Other | Admitting: *Deleted

## 2019-05-29 DIAGNOSIS — Z01812 Encounter for preprocedural laboratory examination: Secondary | ICD-10-CM | POA: Diagnosis not present

## 2019-05-29 DIAGNOSIS — R0602 Shortness of breath: Secondary | ICD-10-CM

## 2019-05-29 DIAGNOSIS — R9439 Abnormal result of other cardiovascular function study: Secondary | ICD-10-CM | POA: Diagnosis not present

## 2019-05-29 DIAGNOSIS — Z20828 Contact with and (suspected) exposure to other viral communicable diseases: Secondary | ICD-10-CM | POA: Diagnosis not present

## 2019-05-29 DIAGNOSIS — G20 Parkinson's disease: Secondary | ICD-10-CM

## 2019-05-29 NOTE — Telephone Encounter (Signed)
Pt contacted pre-catheterization scheduled at [facility] for: Monday Sept 28, 2020 9 AM Verified arrival time and place: [facility]) at: 7 AM   No solid food after midnight prior to cath, clear liquids until 5 AM day of procedure. Contrast allergy: no  Hold: Lasix-AM of procedure.  Except hold medications AM meds can be  taken pre-cath with sip of water including: ASA 81 mg   Confirmed patient has responsible person to drive home post procedure and observe 24 hours after arriving home: yes  Currently, due to Covid-19 pandemic, only one support person will be allowed with patient. Must be the same support person for that patient's entire stay, will be screened and required to wear a mask. They will be asked to wait in the waiting room for the duration of the patient's stay.  Patients are required to wear a mask when they enter the hospital.        COVID-19 Pre-Screening Questions:  . In the past 7 to 10 days have you had a cough,  shortness of breath, headache, congestion, fever (100 or greater) body aches, chills, sore throat, or sudden loss of taste or sense of smell? Shortness of breath  . Have you been around anyone with known Covid 19? no . Have you been around anyone who is awaiting Covid 19 test results in the past 7 to 10 days? no . Have you been around anyone who has been exposed to Covid 19, or has mentioned symptoms of Covid 19 within the past 7 to 10 days? no   I reviewed procedure/mask/visitor restrictions, Covid-19 screening questions with patient, she verbalized understanding, thanked me for call.  Pt is going to [facility] today for pre procedure BMP/CBC and a copy of instructions will be left at front desk for her to pick up.

## 2019-05-29 NOTE — Addendum Note (Signed)
Addended by: Katrine Coho on: 05/29/2019 11:41 AM   Modules accepted: Orders

## 2019-05-30 LAB — CBC
Hematocrit: 34.7 % (ref 34.0–46.6)
Hemoglobin: 11.4 g/dL (ref 11.1–15.9)
MCH: 27.6 pg (ref 26.6–33.0)
MCHC: 32.9 g/dL (ref 31.5–35.7)
MCV: 84 fL (ref 79–97)
Platelets: 259 10*3/uL (ref 150–450)
RBC: 4.13 x10E6/uL (ref 3.77–5.28)
RDW: 16.2 % — ABNORMAL HIGH (ref 11.7–15.4)
WBC: 6.1 10*3/uL (ref 3.4–10.8)

## 2019-05-30 LAB — BASIC METABOLIC PANEL
BUN/Creatinine Ratio: 17 (ref 12–28)
BUN: 14 mg/dL (ref 8–27)
CO2: 24 mmol/L (ref 20–29)
Calcium: 9.5 mg/dL (ref 8.7–10.3)
Chloride: 105 mmol/L (ref 96–106)
Creatinine, Ser: 0.81 mg/dL (ref 0.57–1.00)
GFR calc Af Amer: 86 mL/min/{1.73_m2} (ref >59)
GFR calc non Af Amer: 74 mL/min/{1.73_m2} (ref >59)
Glucose: 70 mg/dL (ref 65–99)
Potassium: 4.1 mmol/L (ref 3.5–5.2)
Sodium: 144 mmol/L (ref 134–144)

## 2019-05-30 LAB — NOVEL CORONAVIRUS, NAA (HOSP ORDER, SEND-OUT TO REF LAB; TAT 18-24 HRS): SARS-CoV-2, NAA: NOT DETECTED

## 2019-06-02 ENCOUNTER — Encounter (HOSPITAL_COMMUNITY)
Admission: RE | Disposition: A | Discharge status: Home / Self Care | Payer: Medicare Other | Source: Home / Self Care | Attending: Interventional Cardiology

## 2019-06-02 ENCOUNTER — Encounter (HOSPITAL_COMMUNITY): Payer: Self-pay | Admitting: Interventional Cardiology

## 2019-06-02 ENCOUNTER — Encounter: Payer: Self-pay | Admitting: *Deleted

## 2019-06-02 ENCOUNTER — Other Ambulatory Visit: Payer: Self-pay

## 2019-06-02 ENCOUNTER — Ambulatory Visit (HOSPITAL_COMMUNITY)
Admission: RE | Admit: 2019-06-02 | Discharge: 2019-06-02 | Disposition: A | Discharge status: Home / Self Care | Payer: Medicare Other | Attending: Interventional Cardiology | Admitting: Interventional Cardiology

## 2019-06-02 DIAGNOSIS — Z8249 Family history of ischemic heart disease and other diseases of the circulatory system: Secondary | ICD-10-CM | POA: Insufficient documentation

## 2019-06-02 DIAGNOSIS — F419 Anxiety disorder, unspecified: Secondary | ICD-10-CM | POA: Diagnosis not present

## 2019-06-02 DIAGNOSIS — M1990 Unspecified osteoarthritis, unspecified site: Secondary | ICD-10-CM | POA: Diagnosis not present

## 2019-06-02 DIAGNOSIS — Z90710 Acquired absence of both cervix and uterus: Secondary | ICD-10-CM | POA: Insufficient documentation

## 2019-06-02 DIAGNOSIS — R0602 Shortness of breath: Secondary | ICD-10-CM | POA: Diagnosis not present

## 2019-06-02 DIAGNOSIS — Z881 Allergy status to other antibiotic agents status: Secondary | ICD-10-CM | POA: Diagnosis not present

## 2019-06-02 DIAGNOSIS — I2510 Atherosclerotic heart disease of native coronary artery without angina pectoris: Secondary | ICD-10-CM | POA: Insufficient documentation

## 2019-06-02 DIAGNOSIS — Z96641 Presence of right artificial hip joint: Secondary | ICD-10-CM | POA: Diagnosis not present

## 2019-06-02 DIAGNOSIS — Z6841 Body Mass Index (BMI) 40.0 and over, adult: Secondary | ICD-10-CM | POA: Diagnosis not present

## 2019-06-02 DIAGNOSIS — M069 Rheumatoid arthritis, unspecified: Secondary | ICD-10-CM | POA: Insufficient documentation

## 2019-06-02 DIAGNOSIS — R9439 Abnormal result of other cardiovascular function study: Secondary | ICD-10-CM | POA: Diagnosis not present

## 2019-06-02 DIAGNOSIS — Z880 Allergy status to penicillin: Secondary | ICD-10-CM | POA: Insufficient documentation

## 2019-06-02 DIAGNOSIS — I110 Hypertensive heart disease with heart failure: Secondary | ICD-10-CM | POA: Diagnosis not present

## 2019-06-02 DIAGNOSIS — E782 Mixed hyperlipidemia: Secondary | ICD-10-CM | POA: Insufficient documentation

## 2019-06-02 DIAGNOSIS — E669 Obesity, unspecified: Secondary | ICD-10-CM | POA: Diagnosis not present

## 2019-06-02 DIAGNOSIS — G20 Parkinson's disease: Secondary | ICD-10-CM | POA: Diagnosis not present

## 2019-06-02 DIAGNOSIS — I5030 Unspecified diastolic (congestive) heart failure: Secondary | ICD-10-CM | POA: Diagnosis not present

## 2019-06-02 DIAGNOSIS — Z79899 Other long term (current) drug therapy: Secondary | ICD-10-CM | POA: Diagnosis not present

## 2019-06-02 DIAGNOSIS — K219 Gastro-esophageal reflux disease without esophagitis: Secondary | ICD-10-CM | POA: Diagnosis not present

## 2019-06-02 HISTORY — PX: LEFT HEART CATH AND CORONARY ANGIOGRAPHY: CATH118249

## 2019-06-02 SURGERY — LEFT HEART CATH AND CORONARY ANGIOGRAPHY
Anesthesia: LOCAL

## 2019-06-02 MED ORDER — HEPARIN (PORCINE) IN NACL 1000-0.9 UT/500ML-% IV SOLN
INTRAVENOUS | Status: AC
  Filled 2019-06-02: qty 1000

## 2019-06-02 MED ORDER — HEPARIN (PORCINE) IN NACL 1000-0.9 UT/500ML-% IV SOLN
INTRAVENOUS | Status: DC | PRN
  Administered 2019-06-02: 500 mL

## 2019-06-02 MED ORDER — SODIUM CHLORIDE 0.9 % WEIGHT BASED INFUSION
3.0000 mL/kg/h | INTRAVENOUS | Status: AC
  Administered 2019-06-02: 3 mL/kg/h via INTRAVENOUS

## 2019-06-02 MED ORDER — NITROGLYCERIN 1 MG/10 ML FOR IR/CATH LAB
INTRA_ARTERIAL | Status: AC
  Filled 2019-06-02: qty 10

## 2019-06-02 MED ORDER — MIDAZOLAM HCL 2 MG/2ML IJ SOLN
INTRAMUSCULAR | Status: DC | PRN
  Administered 2019-06-02: 1 mg via INTRAVENOUS
  Administered 2019-06-02: 2 mg via INTRAVENOUS

## 2019-06-02 MED ORDER — SODIUM CHLORIDE 0.9% FLUSH: 3.0000 mL | Freq: Two times a day (BID) | INTRAVENOUS | Status: DC

## 2019-06-02 MED ORDER — SODIUM CHLORIDE 0.9% FLUSH: 3.0000 mL | INTRAVENOUS | Status: DC | PRN

## 2019-06-02 MED ORDER — HYDRALAZINE HCL 20 MG/ML IJ SOLN: 10.0000 mg | INTRAMUSCULAR | Status: DC | PRN

## 2019-06-02 MED ORDER — SODIUM CHLORIDE 0.9 % IV SOLN: INTRAVENOUS | Status: DC

## 2019-06-02 MED ORDER — MIDAZOLAM HCL 2 MG/2ML IJ SOLN
INTRAMUSCULAR | Status: AC
  Filled 2019-06-02: qty 2

## 2019-06-02 MED ORDER — FENTANYL CITRATE (PF) 100 MCG/2ML IJ SOLN
INTRAMUSCULAR | Status: AC
  Filled 2019-06-02: qty 2

## 2019-06-02 MED ORDER — SODIUM CHLORIDE 0.9 % IV SOLN: 250.0000 mL | INTRAVENOUS | Status: DC | PRN

## 2019-06-02 MED ORDER — ACETAMINOPHEN 325 MG PO TABS: 650.0000 mg | ORAL_TABLET | ORAL | Status: DC | PRN

## 2019-06-02 MED ORDER — SODIUM CHLORIDE 0.9 % WEIGHT BASED INFUSION: 1.0000 mL/kg/h | INTRAVENOUS | Status: DC

## 2019-06-02 MED ORDER — IOHEXOL 350 MG/ML SOLN
INTRAVENOUS | Status: DC | PRN
  Administered 2019-06-02: 100 mL via INTRA_ARTERIAL

## 2019-06-02 MED ORDER — HEPARIN SODIUM (PORCINE) 1000 UNIT/ML IJ SOLN
INTRAMUSCULAR | Status: AC
  Filled 2019-06-02: qty 1

## 2019-06-02 MED ORDER — HEPARIN SODIUM (PORCINE) 1000 UNIT/ML IJ SOLN
INTRAMUSCULAR | Status: DC | PRN
  Administered 2019-06-02: 5500 [IU] via INTRAVENOUS

## 2019-06-02 MED ORDER — LIDOCAINE HCL (PF) 1 % IJ SOLN
INTRAMUSCULAR | Status: AC
  Filled 2019-06-02: qty 30

## 2019-06-02 MED ORDER — ASPIRIN 81 MG PO CHEW: 81.0000 mg | CHEWABLE_TABLET | ORAL | Status: DC

## 2019-06-02 MED ORDER — ONDANSETRON HCL 4 MG/2ML IJ SOLN: 4.0000 mg | Freq: Four times a day (QID) | INTRAMUSCULAR | Status: DC | PRN

## 2019-06-02 MED ORDER — LABETALOL HCL 5 MG/ML IV SOLN: 10.0000 mg | INTRAVENOUS | Status: DC | PRN

## 2019-06-02 MED ORDER — VERAPAMIL HCL 2.5 MG/ML IV SOLN
INTRAVENOUS | Status: DC | PRN
  Administered 2019-06-02: 10 mL via INTRA_ARTERIAL

## 2019-06-02 MED ORDER — VERAPAMIL HCL 2.5 MG/ML IV SOLN
INTRAVENOUS | Status: AC
  Filled 2019-06-02: qty 2

## 2019-06-02 MED ORDER — LIDOCAINE HCL (PF) 1 % IJ SOLN
INTRAMUSCULAR | Status: DC | PRN
  Administered 2019-06-02: 5 mL

## 2019-06-02 MED ORDER — FENTANYL CITRATE (PF) 100 MCG/2ML IJ SOLN
INTRAMUSCULAR | Status: DC | PRN
  Administered 2019-06-02 (×2): 25 ug via INTRAVENOUS

## 2019-06-02 SURGICAL SUPPLY — 14 items
CATH 5FR JL3.5 JR4 ANG PIG MP (CATHETERS) ×1 IMPLANT
CATH INFINITI 5 FR AL2 (CATHETERS) ×1 IMPLANT
CATH INFINITI 5 FR AR1 MOD (CATHETERS) ×1 IMPLANT
CATH INFINITI 5FR AL1 (CATHETERS) ×1 IMPLANT
DEVICE RAD COMP TR BAND LRG (VASCULAR PRODUCTS) ×1 IMPLANT
GLIDESHEATH SLEND SS 6F .021 (SHEATH) ×1 IMPLANT
GUIDEWIRE INQWIRE 1.5J.035X260 (WIRE) IMPLANT
INQWIRE 1.5J .035X260CM (WIRE) ×2
KIT HEART LEFT (KITS) ×2 IMPLANT
PACK CARDIAC CATHETERIZATION (CUSTOM PROCEDURE TRAY) ×2 IMPLANT
SHEATH PROBE COVER 6X72 (BAG) ×1 IMPLANT
TRANSDUCER W/STOPCOCK (MISCELLANEOUS) ×2 IMPLANT
TUBING CIL FLEX 10 FLL-RA (TUBING) ×2 IMPLANT
WIRE HI TORQ VERSACORE-J 145CM (WIRE) ×1 IMPLANT

## 2019-06-02 NOTE — Progress Notes (Signed)
bp 60/42, pt is sitting up and in no sign of distress. Pt did states she felt a little funny/ dizzy so I reclined her and retook BP, 102/57, p 66, states she is feeling ok now, will continue to monitor.

## 2019-06-02 NOTE — Interval H&P Note (Signed)
Cath Lab Visit (complete for each Cath Lab visit)  Clinical Evaluation Leading to the Procedure:   ACS: No.  Non-ACS:    Anginal Classification: CCS III  Anti-ischemic medical therapy: Minimal Therapy (1 class of medications)  Non-Invasive Test Results: Intermediate-risk stress test findings: cardiac mortality 1-3%/year  Prior CABG: No previous CABG      History and Physical Interval Note:  06/02/2019 8:21 AM  Kristin [name]  has presented today for surgery, with the diagnosis of Chest pain.  The various methods of treatment have been discussed with the patient and family. After consideration of risks, benefits and other options for treatment, the patient has consented to  Procedure(s): LEFT HEART CATH AND CORONARY ANGIOGRAPHY (N/A) as a surgical intervention.  The patient's history has been reviewed, patient examined, no change in status, stable for surgery.  I have reviewed the patient's chart and labs.  Questions were answered to the patient's satisfaction.     Larae Grooms

## 2019-06-02 NOTE — Discharge Instructions (Signed)
Radial Site Care ° °This sheet gives you information about how to care for yourself after your procedure. Your health care provider may also give you more specific instructions. If you have problems or questions, contact your health care provider. °What can I expect after the procedure? °After the procedure, it is common to have: °· Bruising and tenderness at the catheter insertion area. °Follow these instructions at home: °Medicines °· Take over-the-counter and prescription medicines only as told by your health care provider. °Insertion site care °· Follow instructions from your health care provider about how to take care of your insertion site. Make sure you: °? Wash your hands with soap and water before you change your bandage (dressing). If soap and water are not available, use hand sanitizer. °? Change your dressing as told by your health care provider. °? Leave stitches (sutures), skin glue, or adhesive strips in place. These skin closures may need to stay in place for 2 weeks or longer. If adhesive strip edges start to loosen and curl up, you may trim the loose edges. Do not remove adhesive strips completely unless your health care provider tells you to do that. °· Check your insertion site every day for signs of infection. Check for: °? Redness, swelling, or pain. °? Fluid or blood. °? Pus or a bad smell. °? Warmth. °· Do not take baths, swim, or use a hot tub until your health care provider approves. °· You may shower 24-48 hours after the procedure, or as directed by your health care provider. °? Remove the dressing and gently wash the site with plain soap and water. °? Pat the area dry with a clean towel. °? Do not rub the site. That could cause bleeding. °· Do not apply powder or lotion to the site. °Activity ° °· For 24 hours after the procedure, or as directed by your health care provider: °? Do not flex or bend the affected arm. °? Do not push or pull heavy objects with the affected arm. °? Do not  drive yourself home from the hospital or clinic. You may drive 24 hours after the procedure unless your health care provider tells you not to. °? Do not operate machinery or power tools. °· Do not lift anything that is heavier than 10 lb (4.5 kg), or the limit that you are told, until your health care provider says that it is safe. °· Ask your health care provider when it is okay to: °? Return to work or school. °? Resume usual physical activities or sports. °? Resume sexual activity. °General instructions °· If the catheter site starts to bleed, raise your arm and put firm pressure on the site. If the bleeding does not stop, get help right away. This is a medical emergency. °· If you went home on the same day as your procedure, a responsible adult should be with you for the first 24 hours after you arrive home. °· Keep all follow-up visits as told by your health care provider. This is important. °Contact a health care provider if: °· You have a fever. °· You have redness, swelling, or yellow drainage around your insertion site. °Get help right away if: °· You have unusual pain at the radial site. °· The catheter insertion area swells very fast. °· The insertion area is bleeding, and the bleeding does not stop when you hold steady pressure on the area. °· Your arm or hand becomes pale, cool, tingly, or numb. °These symptoms may represent a serious problem   that is an emergency. Do not wait to see if the symptoms will go away. Get medical help right away. Call your local emergency services (911 in the [place] Do not drive yourself to the hospital. °Summary °· After the procedure, it is common to have bruising and tenderness at the site. °· Follow instructions from your health care provider about how to take care of your radial site wound. Check the wound every day for signs of infection. °· Do not lift anything that is heavier than 10 lb (4.5 kg), or the limit that you are told, until your health care provider says  that it is safe. °This information is not intended to replace advice given to you by your health care provider. Make sure you discuss any questions you have with your health care provider. °Document Released: 09/23/2010 Document Revised: 09/26/2017 Document Reviewed: 09/26/2017 °Elsevier Patient Education © 2020 [employer] ° °

## 2019-06-03 ENCOUNTER — Ambulatory Visit: Payer: Medicare Other | Admitting: Family Medicine

## 2019-06-03 MED FILL — Nitroglycerin IV Soln 100 MCG/ML in D5W: INTRA_ARTERIAL | Qty: 10 | Status: AC

## 2019-06-05 ENCOUNTER — Encounter: Payer: Medicare Other | Admitting: Occupational Therapy

## 2019-06-05 ENCOUNTER — Ambulatory Visit: Payer: Medicare Other | Admitting: Physical Therapy

## 2019-06-06 ENCOUNTER — Other Ambulatory Visit: Payer: Self-pay

## 2019-06-06 ENCOUNTER — Encounter: Payer: Self-pay | Admitting: Occupational Therapy

## 2019-06-06 ENCOUNTER — Ambulatory Visit: Payer: Medicare Other | Attending: Neurology | Admitting: Occupational Therapy

## 2019-06-06 ENCOUNTER — Ambulatory Visit: Payer: Medicare Other | Admitting: Physical Therapy

## 2019-06-06 ENCOUNTER — Encounter: Payer: Self-pay | Admitting: Physical Therapy

## 2019-06-06 VITALS — BP 101/76 | HR 76

## 2019-06-06 DIAGNOSIS — R278 Other lack of coordination: Secondary | ICD-10-CM | POA: Diagnosis not present

## 2019-06-06 DIAGNOSIS — R2689 Other abnormalities of gait and mobility: Secondary | ICD-10-CM | POA: Diagnosis not present

## 2019-06-06 DIAGNOSIS — R251 Tremor, unspecified: Secondary | ICD-10-CM | POA: Diagnosis not present

## 2019-06-06 DIAGNOSIS — R29898 Other symptoms and signs involving the musculoskeletal system: Secondary | ICD-10-CM | POA: Diagnosis not present

## 2019-06-06 DIAGNOSIS — R29818 Other symptoms and signs involving the nervous system: Secondary | ICD-10-CM | POA: Insufficient documentation

## 2019-06-06 DIAGNOSIS — R41840 Attention and concentration deficit: Secondary | ICD-10-CM | POA: Diagnosis not present

## 2019-06-06 DIAGNOSIS — R2681 Unsteadiness on feet: Secondary | ICD-10-CM | POA: Diagnosis not present

## 2019-06-06 DIAGNOSIS — R293 Abnormal posture: Secondary | ICD-10-CM

## 2019-06-06 NOTE — Therapy (Signed)
[facility] [address] [place], [place], [address] Phone: [phone]   Fax:  [phone]  Physical Therapy Treatment  Patient Details  Name: Kristin [name] MRN: [id number] Date of Birth: 03-23-1951 Referring Provider (PT): Tat, [name]   Encounter Date: 06/06/2019  PT End of Session - 06/06/19 0944    Visit Number  5    Number of Visits  5    Date for PT Re-Evaluation  07/04/19    Authorization Type  Medicare/[employer]; Covered 100%    PT Start Time  0720    PT Stop Time  0800    PT Time Calculation (min)  40 min    Activity Tolerance  Patient tolerated treatment well    Behavior During Therapy  [facility] for tasks assessed/performed       Past Medical History:  Diagnosis Date  . Abnormal vaginal Pap smear   . Acute pharyngitis 02/25/2013  . Anemia   . Anxiety   . Arthritis   . 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 Chicken pox as a child  . Chronic UTI    sees dr Terance Hart  . Constipation 12/07/2015  . Depression with anxiety 11/02/2009   Qualifier: Diagnosis of  By: [name], LPN, [name]   . Dermatitis 07/17/2012  . Esophageal stricture 1994  . Fibroids   . Foot pain, bilateral 06/19/2012  . GERD (gastroesophageal reflux disease)   . GERD (gastroesophageal reflux disease)   . Hiatal hernia   . Hyperglycemia 08/19/2013  . 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 Hyperlipidemia   . Hypertension   . Infertility, female   . Low back pain 10/17/2007   Qualifier: Diagnosis of  By: [name], [name]   . Measles as a child  . Obesity   . Osteoarthritis   . Parkinson disease ([place])   . Plantar fasciitis of left foot 06/19/2012  . Preventative health care 12/19/2015  . Rheumatoid arthritis ([place]) 01/08/2018  . Rosacea 10/05/2014  . Swallowing difficulty   . Urinary frequency 02/25/2013  . Visual floaters 05/11/2014    Past Surgical History:  Procedure Laterality Date  . ABDOMINAL HYSTERECTOMY   2006   total  . esophageal     stretching  . laporoscopy    . LEFT HEART CATH AND CORONARY ANGIOGRAPHY N/A 06/02/2019   Procedure: LEFT HEART CATH AND CORONARY ANGIOGRAPHY;  Surgeon: Jettie Booze, MD;  Location: [facility];  Service: Cardiovascular;  Laterality: N/A;  . TONSILLECTOMY    . TOTAL HIP ARTHROPLASTY Right 2010  . wisdom teeth extracted      Vitals:   06/06/19 0728  BP: 101/76  Pulse: 76    Subjective Assessment - 06/06/19 0724    Subjective  Found out the stress test was abnormal.  Had to go for a catherization earlier this week.  Have not heard yet from that.  Have talked with Judson Roch, Education officer, museum at [employer] this week.    Patient Stated Goals  Pt's goals for therapy are to improve strength.    Currently in Pain?  No/denies                       [facility] Adult PT Treatment/Exercise - 06/06/19 0001      Transfers   Transfers  Sit to Stand;Stand to Sit    Sit to Stand  6: Modified independent (Device/Increase time);Without upper extremity assist;From chair/3-in-1  Five time sit to stand comments   11.35    Stand to Sit  6: Modified independent (Device/Increase time);Without upper extremity assist;To chair/3-in-1      Ambulation/Gait   Ambulation/Gait  Yes    Ambulation/Gait Assistance  6: Modified independent (Device/Increase time)    Ambulation Distance (Feet)  600 Feet    Assistive device  None    Gait Pattern  Step-through pattern;Decreased arm swing - right;Decreased stance time - right;Decreased trunk rotation    Ambulation Surface  Level;Indoor    Gait velocity  9.59 sec = 3.42 ft/sec    Pre-Gait Activities  Pre gait:  98%O2, 78 bpm HR; After gait:  96%, 102 bpm    Gait Comments  3 minute walk:  520 ft   1 standing rest break     Standardized Balance Assessment   Standardized Balance Assessment  Timed Up and Go Test      Timed Up and Go Test   TUG  Normal TUG    Normal TUG (seconds)  10.31      Self-Care   Self-Care   Other Self-Care Comments    Other Self-Care Comments   Discussed progress towards goals, POC and plans for d/c.  Discussed continueing physical activity and exercise to patient's tolerance (to check with cardiologist on any specific activity restrictions).          [facility]) - 06/06/19 0755    PWR! exercises  Moves in standing;Moves in supine;Moves in prone    PWR! Up  x 5 reps, through shoulders and hips    PWR! Rock  x 5 reps each side    PWR! Twist  x 5 reps each side     PWR! Step  x 5 reps each side    Comments  Review of PWR! Moves-pt return demo understanding and independence    PWR! Up  x 5 reps    PWR! Rock  x 5 reps each side    PWR! Twist  x5 reps each side    PWR! Step  x 5 reps each side    Comments  Review of PWR! Moves prone-pt return demo understanding and independence    PWR! Up  x 5 reps    PWR! Rock  x 5 reps each side    PWR! Twist  x 5 reps each side    PWR Step  x 5 reps each side    Comments  Review of PWR! Moves in [place] return demo understanding and independence               PT [place] - 06/06/19 0945      PT LONG TERM GOAL #1   Title  Pt will be independent with HEP for improved functional mobility, strength, balance, PD-specific deficits.  TARGET 06/06/2019 (may be delayed due to delayed start coordinating PT, OT)    Time  4    Period  Weeks    Status  Achieved      PT LONG TERM GOAL #2   Title  Pt will improve 5x sit<>stand to less than or equal to 10 seconds for improved functional strength.    Time  4    Period  Weeks    Status  Not Met      PT LONG TERM GOAL #3   Title  Pt will improve 3 MWT distance to at least 575 ft for improved gait efficiency and safety for community walking.    Baseline  520  ft    Time  4    Period  Weeks    Status  Not Met      PT LONG TERM GOAL #4   Title  Pt will verbalize understanding of appropriate ongoing community fitness upon d/c from PT.    Time  4    Period  Weeks    Status   Achieved            Plan - 06/06/19 0945    Clinical Impression Statement  Assessed LTGs this visit and reviewed pt's HEP.  Pt has met LTG 1 and 4.  Pt has not met LTG 2 and 3 for sit to stand and 3 minute walk test.  Pt's overall activity level and progression of intensity of exercise has been limited this episode of therapy due to ongoing SOB issues (pt is being worked up by cardiologist).  Pt is independent with various positions of PWR! Moves, which do not bring on SOB symptoms.  She is also able to performing stationary bike and short distance walking activities at home.  She is appropriate for d/c from PT at this time.    Personal Factors and Comorbidities  Comorbidity 3+    Comorbidities  RA, anxiety, depression, HTN, hx of back pain    Examination-Activity Limitations  Transfers;Locomotion Level    Examination-Participation Restrictions  Community Activity   Community fitness   Stability/Clinical Decision Making  Evolving/Moderate complexity    Rehab Potential  Good    PT Frequency  1x / week    PT Duration  4 weeks   plus eva   PT Treatment/Interventions  ADLs/Self Care Home Management;Gait training;Neuromuscular re-education;Balance training;Therapeutic activities;Functional mobility training;Patient/family education    PT Next Visit Plan  D/C PT at this time; plan on return PD screen versus eval in 6 months    Consulted and Agree with Plan of Care  Patient       Patient will benefit from skilled therapeutic intervention in order to improve the following deficits and impairments:  Abnormal gait, Decreased activity tolerance, Decreased balance, Decreased mobility, Difficulty walking, Decreased strength, Postural dysfunction  Visit Diagnosis: Unsteadiness on feet  Other abnormalities of gait and mobility     Problem List Patient Active Problem List   Diagnosis Date Noted  . Abnormal stress test   . Diastolic dysfunction 76/16/0737  . Cough 05/10/2018  . Rheumatoid  arthritis ([place]) 01/08/2018  . Hand pain, right 10/15/2017  . Vitamin D deficiency 12/20/2016  . Prediabetes 12/20/2016  . Shortness of breath on exertion 11/27/2016  . Preventative health care 12/19/2015  . Constipation 12/07/2015  . BCC (basal cell carcinoma of skin) 10/05/2014  . Rosacea 10/05/2014  . Parkinson's disease ([place]) 05/11/2014  . Visual floaters 05/11/2014  . Paralysis agitans ([place]) 01/08/2014  . Screening for cervical cancer 07/17/2012  . Foot pain, bilateral 06/19/2012  . 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 Obesity 11/12/2009  . Depression with anxiety 11/02/2009  . 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 Lumbar back pain with radiculopathy affecting lower extremity 10/17/2007  . Essential hypertension 07/05/2007  . Osteoarthritis 07/05/2007  . Hyperlipidemia 06/26/2007  . H/O: iron deficiency anemia 05/08/2007  . GERD 05/08/2007    Amere Bricco W. 06/06/2019, 9:50 AM Frazier Butt., PT  [facility] [address] [place], [place], [address] Phone: [phone]   Fax:  [phone]  Name: Kristin [name] MRN: [id number] Date of Birth: 06/06/1951   PHYSICAL THERAPY DISCHARGE SUMMARY  Visits from  Start of Care: 5  Current functional level related to goals / functional outcomes: PT Long Term Goals - 06/06/19 0945      PT LONG TERM GOAL #1   Title  Pt will be independent with HEP for improved functional mobility, strength, balance, PD-specific deficits.  TARGET 06/06/2019 (may be delayed due to delayed start coordinating PT, OT)    Time  4    Period  Weeks    Status  Achieved      PT LONG TERM GOAL #2   Title  Pt will improve 5x sit<>stand to less than or equal to 10 seconds for improved functional strength.    Time  4    Period  Weeks    Status  Not Met      PT LONG TERM GOAL #3   Title  Pt will improve 3 MWT distance to at least 575 ft for improved gait efficiency and safety for  community walking.    Baseline  520 ft    Time  4    Period  Weeks    Status  Not Met      PT LONG TERM GOAL #4   Title  Pt will verbalize understanding of appropriate ongoing community fitness upon d/c from PT.    Time  4    Period  Weeks    Status  Achieved      Pt has met 2 of 4 LTGs.   Remaining deficits: Decreased endurance, decreased activity tolerance (due to SOB-pt being worked up by cardiologist)   Education / Equipment: Educated in [employer] and walking program (pt is aware to check with cardiologist before adding any additional strenuous exercise to her routine).  Plan: Patient agrees to discharge.  Patient goals were partially met. Patient is being discharged due to being pleased with the current functional level.  ?????   Mady Haagensen, PT 06/06/19 9:52 AM Phone: [phone] Fax: [phone]

## 2019-06-06 NOTE — Therapy (Signed)
[facility] [address] [place], [place], [address] Phone: [phone]   Fax:  [phone]  Occupational Therapy Treatment  Patient Details  Name: Kristin [name] MRN: [id number] Date of Birth: 1950/11/18 Referring Provider (OT): Dr. [name] Tat   Encounter Date: 06/06/2019  OT End of Session - 06/06/19 0804    Visit Number  4    Number of Visits  8    Date for OT Re-Evaluation  07/05/19    Authorization Type  Medicare & [employer], covered 100%    Authorization - Visit Number  4    Authorization - Number of Visits  10    OT Start Time  0803    OT Stop Time  [phone]    OT Time Calculation (min)  40 min    Activity Tolerance  Patient tolerated treatment well    Behavior During Therapy  [facility] for tasks assessed/performed       Past Medical History:  Diagnosis Date  . Abnormal vaginal Pap smear   . Acute pharyngitis 02/25/2013  . Anemia   . Anxiety   . Arthritis   . 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 Chicken pox as a child  . Chronic UTI    sees dr Terance Hart  . Constipation 12/07/2015  . Depression with anxiety 11/02/2009   Qualifier: Diagnosis of  By: [name], LPN, [name]   . Dermatitis 07/17/2012  . Esophageal stricture 1994  . Fibroids   . Foot pain, bilateral 06/19/2012  . GERD (gastroesophageal reflux disease)   . GERD (gastroesophageal reflux disease)   . Hiatal hernia   . Hyperglycemia 08/19/2013  . 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 Hyperlipidemia   . Hypertension   . Infertility, female   . Low back pain 10/17/2007   Qualifier: Diagnosis of  By: [name], [name]   . Measles as a child  . Obesity   . Osteoarthritis   . Parkinson disease ([place])   . Plantar fasciitis of left foot 06/19/2012  . Preventative health care 12/19/2015  . Rheumatoid arthritis ([place]) 01/08/2018  . Rosacea 10/05/2014  . Swallowing difficulty   . Urinary frequency 02/25/2013  . Visual floaters  05/11/2014    Past Surgical History:  Procedure Laterality Date  . ABDOMINAL HYSTERECTOMY  2006   total  . esophageal     stretching  . laporoscopy    . LEFT HEART CATH AND CORONARY ANGIOGRAPHY N/A 06/02/2019   Procedure: LEFT HEART CATH AND CORONARY ANGIOGRAPHY;  Surgeon: Jettie Booze, MD;  Location: [facility];  Service: Cardiovascular;  Laterality: N/A;  . TONSILLECTOMY    . TOTAL HIP ARTHROPLASTY Right 2010  . wisdom teeth extracted      There were no vitals filed for this visit.  Subjective Assessment - 06/06/19 0804    Currently in Pain?  No/denies               Treatment: Flipping and dealing cards with larger amplitude movements and rotating golf balls for in hand manipulation, min-mod v.c Pt practiced removing lids from bottles using big movements, pt performs better if she slows down and takes a deep breath if she starts to get frustrated.               OT Short Term Goals - 05/06/19 0921      OT SHORT TERM GOAL #1   Title  Pt will be independent with  updated HEP.--check STGs 06/05/19    Time  4    Period  Weeks    Status  New      OT SHORT TERM GOAL #2   Title  Pt will be able to don/doff earrings using AE prn.    Time  4    Period  Weeks    Status  New        OT Long Term Goals - 05/06/19 [id number]      OT LONG TERM GOAL #1   Title  Pt will verbalize understanding of adaptive stratgies to incr. ease/efficiency with ADLs/IADLs--check LTGs 07/05/19    Time  8    Period  Weeks    Status  New      OT LONG TERM GOAL #2   Title  Pt will improve R hand coordination for ADLs as shown by improving time on 9-hole peg test by at least 7sec.    Baseline  30.91sec    Time  8    Period  Weeks    Status  New      OT LONG TERM GOAL #3   Title  Pt will improve functional reaching/coordination for ADLs as shown by improving score on box and blocks test by at least 7 blocks with RUE.    Baseline  R-53 blocks    Time  8    Period   Weeks    Status  New      OT LONG TERM GOAL #4   Title  ---      OT LONG TERM GOAL #5   Title  ---            Plan - 06/06/19 1554    Clinical Impression Statement  Pt is progressing towards goals. She returned demonstration of PWR! modified quadraped basic 4, and PWR! hands series.    OT Occupational Profile and History  Detailed Assessment- Review of Records and additional review of physical, cognitive, psychosocial history related to current functional performance    Occupational performance deficits (Please refer to evaluation for details):  ADL's;IADL's;Leisure;Social Participation    Body Structure / Function / Physical Skills  ADL;Dexterity;ROM;IADL;Balance;Mobility;Tone;FMC;Coordination;Decreased knowledge of use of DME;UE functional use;GMC    Rehab Potential  Good    Clinical Decision Making  Limited treatment options, no task modification necessary    Comorbidities Affecting Occupational Performance:  May have comorbidities impacting occupational performance    Modification or Assistance to Complete Evaluation   Min-Moderate modification of tasks or assist with assess necessary to complete eval    OT Frequency  1x / week    OT Duration  8 weeks   +eval, but likely 6 weeks   OT Treatment/Interventions  Self-care/ADL training;Moist Heat;Fluidtherapy;DME and/or AE instruction;Therapeutic activities;Therapeutic exercise;Cognitive remediation/compensation;Passive range of motion;Functional Mobility Training;Neuromuscular education;Cryotherapy;Paraffin;Energy conservation;Manual Therapy;Patient/family education    Plan  review coordination HEP prn, functional reaching, check on typing    Consulted and Agree with Plan of Care  Patient       Patient will benefit from skilled therapeutic intervention in order to improve the following deficits and impairments:   Body Structure / Function / Physical Skills: ADL, Dexterity, ROM, IADL, Balance, Mobility, Tone, FMC, Coordination,  Decreased knowledge of use of DME, UE functional use, GMC       Visit Diagnosis: Other symptoms and signs involving the nervous system  Other symptoms and signs involving the musculoskeletal system  Other lack of coordination  Abnormal posture    Problem List Patient Active  Problem List   Diagnosis Date Noted  . Abnormal stress test   . Diastolic dysfunction [id number]  . Cough 05/10/2018  . Rheumatoid arthritis ([place]) 01/08/2018  . Hand pain, right 10/15/2017  . Vitamin D deficiency 12/20/2016  . Prediabetes 12/20/2016  . Shortness of breath on exertion 11/27/2016  . Preventative health care 12/19/2015  . Constipation 12/07/2015  . BCC (basal cell carcinoma of skin) 10/05/2014  . Rosacea 10/05/2014  . Parkinson's disease ([place]) 05/11/2014  . Visual floaters 05/11/2014  . Paralysis agitans ([place]) 01/08/2014  . Screening for cervical cancer 07/17/2012  . Foot pain, bilateral 06/19/2012  . 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 Obesity 11/12/2009  . Depression with anxiety 11/02/2009  . 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 Lumbar back pain with radiculopathy affecting lower extremity 10/17/2007  . Essential hypertension 07/05/2007  . Osteoarthritis 07/05/2007  . Hyperlipidemia 06/26/2007  . H/O: iron deficiency anemia 05/08/2007  . GERD 05/08/2007    Destry Bezdek 06/06/2019, 3:55 PM  [facility] [address] [place], [place], [address] Phone: [phone]   Fax:  [phone]  Name: MILEAH TETERS MRN: [id number] Date of Birth: Aug 15, 1951

## 2019-06-06 NOTE — Patient Instructions (Signed)

## 2019-06-11 ENCOUNTER — Ambulatory Visit: Payer: Medicare Other | Admitting: Occupational Therapy

## 2019-06-11 ENCOUNTER — Encounter: Payer: Self-pay | Admitting: Occupational Therapy

## 2019-06-11 ENCOUNTER — Other Ambulatory Visit: Payer: Self-pay

## 2019-06-11 DIAGNOSIS — R41840 Attention and concentration deficit: Secondary | ICD-10-CM | POA: Diagnosis not present

## 2019-06-11 DIAGNOSIS — R278 Other lack of coordination: Secondary | ICD-10-CM

## 2019-06-11 DIAGNOSIS — Z79899 Other long term (current) drug therapy: Secondary | ICD-10-CM | POA: Diagnosis not present

## 2019-06-11 DIAGNOSIS — R29898 Other symptoms and signs involving the musculoskeletal system: Secondary | ICD-10-CM

## 2019-06-11 DIAGNOSIS — R293 Abnormal posture: Secondary | ICD-10-CM | POA: Diagnosis not present

## 2019-06-11 DIAGNOSIS — R251 Tremor, unspecified: Secondary | ICD-10-CM

## 2019-06-11 DIAGNOSIS — R29818 Other symptoms and signs involving the nervous system: Secondary | ICD-10-CM

## 2019-06-11 NOTE — Therapy (Signed)
[facility] [address] [place], [place], [address] Phone: [phone]   Fax:  [phone]  Occupational Therapy Treatment  Patient Details  Name: Kristin [name] MRN: [id number] Date of Birth: Mar 28, 1951 Referring Provider (OT): Dr. [name] Tat   Encounter Date: 06/11/2019  OT End of Session - 06/11/19 0814    Visit Number  5    Number of Visits  8    Date for OT Re-Evaluation  07/05/19    Authorization Type  Medicare & [employer], covered 100%    Authorization - Visit Number  5    Authorization - Number of Visits  10    OT Start Time  [phone]    OT Stop Time  0756   pt needed to leave early for son's appt/discharge day   OT Time Calculation (min)  34 min    Activity Tolerance  Patient tolerated treatment well    Behavior During Therapy  [facility] for tasks assessed/performed       Past Medical History:  Diagnosis Date  . Abnormal vaginal Pap smear   . Acute pharyngitis 02/25/2013  . Anemia   . Anxiety   . Arthritis   . 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 Chicken pox as a child  . Chronic UTI    sees dr Terance Hart  . Constipation 12/07/2015  . Depression with anxiety 11/02/2009   Qualifier: Diagnosis of  By: [name], LPN, [name]   . Dermatitis 07/17/2012  . Esophageal stricture 1994  . Fibroids   . Foot pain, bilateral 06/19/2012  . GERD (gastroesophageal reflux disease)   . GERD (gastroesophageal reflux disease)   . Hiatal hernia   . Hyperglycemia 08/19/2013  . 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 Hyperlipidemia   . Hypertension   . Infertility, female   . Low back pain 10/17/2007   Qualifier: Diagnosis of  By: [name], [name]   . Measles as a child  . Obesity   . Osteoarthritis   . Parkinson disease ([place])   . Plantar fasciitis of left foot 06/19/2012  . Preventative health care 12/19/2015  . Rheumatoid arthritis ([place]) 01/08/2018  . Rosacea 10/05/2014  . Swallowing difficulty   .  Urinary frequency 02/25/2013  . Visual floaters 05/11/2014    Past Surgical History:  Procedure Laterality Date  . ABDOMINAL HYSTERECTOMY  2006   total  . esophageal     stretching  . laporoscopy    . LEFT HEART CATH AND CORONARY ANGIOGRAPHY N/A 06/02/2019   Procedure: LEFT HEART CATH AND CORONARY ANGIOGRAPHY;  Surgeon: Jettie Booze, MD;  Location: [facility];  Service: Cardiovascular;  Laterality: N/A;  . TONSILLECTOMY    . TOTAL HIP ARTHROPLASTY Right 2010  . wisdom teeth extracted      There were no vitals filed for this visit.  Subjective Assessment - 06/11/19 0724    Subjective   Pt reports that her son is struggling with his eyes and she is the only one who can drive now.  Pt reports that she feels like her hands/PD is currently stable    Pertinent History  Parkinson's Disease.    PMH:  RA, depression, anxiety, HTN, OA, GERD, prediabetes, vitamin D deficiency, basal cell carcinoma of skin, hyperhydrosis, hyperlipidemia    Patient Stated Goals  improve R hand coordination/use, improve ability to don earrings    Currently in Pain?  No/denies  Checked goals and discussed progress--See below.   Reviewed importance of use of PWR! Hands strategy and to use slow, deliberate movements, taking a breath and restarting if difficulty to improve performance and decr risk for future complications.    Discussed role as caregiver in addition to person with PD and impacts/resources.         OT Education - 06/11/19 [phone]    Education Details  Recommended caregiver resources due to being caregiver for son & husband (support groups/respite/[facility], look into long term care benefits for assistance with family, Voc Rehab for son, social worker/counseling, transportation resources).    Person(s) Educated  Patient    Methods  Explanation    Comprehension  Verbalized understanding       OT Short Term Goals - 06/11/19 0751      OT SHORT TERM GOAL #1   Title  Pt  will be independent with updated HEP.--check STGs 06/05/19    Time  4    Period  Weeks    Status  Achieved      OT SHORT TERM GOAL #2   Title  Pt will be able to don/doff earrings using AE prn.    Time  4    Period  Weeks    Status  Achieved        OT Long Term Goals - 06/11/19 1191      OT LONG TERM GOAL #1   Title  Pt will verbalize understanding of adaptive stratgies to incr. ease/efficiency with ADLs/IADLs--check LTGs 07/05/19    Time  8    Period  Weeks    Status  Achieved      OT LONG TERM GOAL #2   Title  Pt will improve R hand coordination for ADLs as shown by improving time on 9-hole peg test by at least 7sec.    Baseline  30.91sec    Time  8    Period  Weeks    Status  Not Met   06/11/19:  31.78sec     OT LONG TERM GOAL #3   Title  Pt will improve functional reaching/coordination for ADLs as shown by improving score on box and blocks test by at least 7 blocks with RUE.    Baseline  R-53 blocks    Time  8    Period  Weeks    Status  Not Met   06/11/19  50 blocks     OT LONG TERM GOAL #4   Title  ---      OT LONG TERM GOAL #5   Title  ---            Plan - 06/11/19 0809    Clinical Impression Statement  Pt reports that she is pleased with current functional level and is appropriate for and requests d/c for OT today.    OT Occupational Profile and History  Detailed Assessment- Review of Records and additional review of physical, cognitive, psychosocial history related to current functional performance    Occupational performance deficits (Please refer to evaluation for details):  ADL's;IADL's;Leisure;Social Participation    Body Structure / Function / Physical Skills  ADL;Dexterity;ROM;IADL;Balance;Mobility;Tone;FMC;Coordination;Decreased knowledge of use of DME;UE functional use;GMC    Rehab Potential  Good    Clinical Decision Making  Limited treatment options, no task modification necessary    Comorbidities Affecting Occupational Performance:  May have  comorbidities impacting occupational performance    Modification or Assistance to Complete Evaluation   Min-Moderate modification of tasks or assist with  assess necessary to complete eval    OT Frequency  1x / week    OT Duration  8 weeks   +eval, but likely 6 weeks   OT Treatment/Interventions  Self-care/ADL training;Moist Heat;Fluidtherapy;DME and/or AE instruction;Therapeutic activities;Therapeutic exercise;Cognitive remediation/compensation;Passive range of motion;Functional Mobility Training;Neuromuscular education;Cryotherapy;Paraffin;Energy conservation;Manual Therapy;Patient/family education    Plan  d/c OT    Consulted and Agree with Plan of Care  Patient       Patient will benefit from skilled therapeutic intervention in order to improve the following deficits and impairments:   Body Structure / Function / Physical Skills: ADL, Dexterity, ROM, IADL, Balance, Mobility, Tone, FMC, Coordination, Decreased knowledge of use of DME, UE functional use, GMC       Visit Diagnosis: Other symptoms and signs involving the nervous system  Other symptoms and signs involving the musculoskeletal system  Other lack of coordination  Abnormal posture  Tremor  Attention and concentration deficit    Problem List Patient Active Problem List   Diagnosis Date Noted  . Abnormal stress test   . Diastolic dysfunction 16/06/9603  . Cough 05/10/2018  . Rheumatoid arthritis ([place]) 01/08/2018  . Hand pain, right 10/15/2017  . Vitamin D deficiency 12/20/2016  . Prediabetes 12/20/2016  . Shortness of breath on exertion 11/27/2016  . Preventative health care 12/19/2015  . Constipation 12/07/2015  . BCC (basal cell carcinoma of skin) 10/05/2014  . Rosacea 10/05/2014  . Parkinson's disease ([place]) 05/11/2014  . Visual floaters 05/11/2014  . Paralysis agitans ([place]) 01/08/2014  . Screening for cervical cancer 07/17/2012  . Foot pain, bilateral 06/19/2012  . 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  Obesity 11/12/2009  . Depression with anxiety 11/02/2009  . 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 Lumbar back pain with radiculopathy affecting lower extremity 10/17/2007  . Essential hypertension 07/05/2007  . Osteoarthritis 07/05/2007  . Hyperlipidemia 06/26/2007  . H/O: iron deficiency anemia 05/08/2007  . GERD 05/08/2007    OCCUPATIONAL THERAPY DISCHARGE SUMMARY  Visits from Start of Care: 5  Current functional level related to goals / functional outcomes: See above   Remaining deficits: Mild Bradykinesia, rigidity, decr coordination, abnormal posture, decr balance/functional mobility, Tremors   Education / Equipment: Pt was instructed in the following:  PD-specific HEP, adaptive strategies for ADLs/IADLs, ways to prevent future complications, community resources.  Pt verbalized understanding of all education provided.    Plan: Patient agrees to discharge.  Patient goals were partially mett. Patient is being discharged due to being pleased with the current functional level.  Pt would benefit from re-evaluation/occupational therapy screen in approx 6 months to assess for need for further therapy/functional changes due to progressive nature of diagnosis.         [facility] 06/11/2019, 8:17 AM  [facility] [address] [place], [place], [address] Phone: [phone]   Fax:  [phone]  Name: ADENA SIMA MRN: [id number] Date of Birth: Sep 18, 1950   Vianne Bulls, OTR/L [facility] [address] [place], [place]  [address] [phone] phone [phone] 06/11/19 8:18 AM

## 2019-06-12 ENCOUNTER — Encounter: Payer: Self-pay | Admitting: Cardiology

## 2019-06-12 ENCOUNTER — Ambulatory Visit (INDEPENDENT_AMBULATORY_CARE_PROVIDER_SITE_OTHER): Payer: Medicare Other | Admitting: Cardiology

## 2019-06-12 VITALS — BP 100/64 | HR 76 | Ht 64.0 in | Wt 245.0 lb

## 2019-06-12 DIAGNOSIS — I5189 Other ill-defined heart diseases: Secondary | ICD-10-CM

## 2019-06-12 DIAGNOSIS — E669 Obesity, unspecified: Secondary | ICD-10-CM

## 2019-06-12 DIAGNOSIS — I2510 Atherosclerotic heart disease of native coronary artery without angina pectoris: Secondary | ICD-10-CM | POA: Diagnosis not present

## 2019-06-12 DIAGNOSIS — E782 Mixed hyperlipidemia: Secondary | ICD-10-CM

## 2019-06-12 DIAGNOSIS — Z6830 Body mass index (BMI) 30.0-30.9, adult: Secondary | ICD-10-CM | POA: Diagnosis not present

## 2019-06-12 MED ORDER — FUROSEMIDE 20 MG PO TABS: 20.0000 mg | ORAL_TABLET | Freq: Every day | ORAL | 1 refills | Status: DC

## 2019-06-12 MED ORDER — ASPIRIN EC 81 MG PO TBEC: 81.0000 mg | DELAYED_RELEASE_TABLET | Freq: Every day | ORAL | 1 refills | Status: DC

## 2019-06-12 NOTE — Progress Notes (Signed)
Cardiology Office Note:    Date:  06/12/2019   ID:  Kristin [name], DOB [birth year], MRN [id number]  PCP:  Mosie Lukes, MD  Cardiologist:  No primary care provider on file.  Electrophysiologist:  None   Referring MD: Mosie Lukes, MD   Chief Complaint  Patient presents with  . Follow-up   History of Present Illness:    Kristin [name] is a 68 y.o. female with a hx of hypertension, hyperlipidemia, obesity and rheumatoid arthritis.  At this see the patient on May 15, 2019 at which time she reported shortness of breath on exertion.  With her physical exam suggesting leg edema and documented diastolic dysfunction she was started on Lasix 20 mg Tuesday Thursdays and Saturdays as well as her risk factor for coronary artery disease pharmacologic nuclear stress test was ordered.  She did undergo a nuclear stress test which was reported abnormal for ischemia therefore we proceeded on left heart catheterization.  She did undergo a left heart catheterization which showed mild coronary artery disease with no need for continuous coronary intervention.  Today she tells me that she feels improved, since she started taking the Lasix.  Denies any chest pain, shortness of breath, nausea, vomiting.   Past Medical History:  Diagnosis Date  . Abnormal vaginal Pap smear   . Acute pharyngitis 02/25/2013  . Anemia   . Anxiety   . Arthritis   . 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 Chicken pox as a child  . Chronic UTI    sees dr Terance Hart  . Constipation 12/07/2015  . Depression with anxiety 11/02/2009   Qualifier: Diagnosis of  By: [name], LPN, [name]   . Dermatitis 07/17/2012  . Esophageal stricture 1994  . Fibroids   . Foot pain, bilateral 06/19/2012  . GERD (gastroesophageal reflux disease)   . GERD (gastroesophageal reflux disease)   . Hiatal hernia   . Hyperglycemia 08/19/2013  . 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 Hyperlipidemia   . Hypertension   .  Infertility, female   . Low back pain 10/17/2007   Qualifier: Diagnosis of  By: [name], [name]   . Measles as a child  . Obesity   . Osteoarthritis   . Parkinson disease ([place])   . Plantar fasciitis of left foot 06/19/2012  . Preventative health care 12/19/2015  . Rheumatoid arthritis ([place]) 01/08/2018  . Rosacea 10/05/2014  . Swallowing difficulty   . Urinary frequency 02/25/2013  . Visual floaters 05/11/2014    Past Surgical History:  Procedure Laterality Date  . ABDOMINAL HYSTERECTOMY  2006   total  . esophageal     stretching  . laporoscopy    . LEFT HEART CATH AND CORONARY ANGIOGRAPHY N/A 06/02/2019   Procedure: LEFT HEART CATH AND CORONARY ANGIOGRAPHY;  Surgeon: Jettie Booze, MD;  Location: [facility];  Service: Cardiovascular;  Laterality: N/A;  . TONSILLECTOMY    . TOTAL HIP ARTHROPLASTY Right 2010  . wisdom teeth extracted      Current Medications: Current Meds  Medication Sig  . albuterol (VENTOLIN HFA) 108 (90 Base) MCG/ACT inhaler Inhale 2 puffs into the lungs every 6 (six) hours as needed for wheezing or shortness of breath.  . ALPRAZolam (XANAX) 0.25 MG tablet TAKE 1/2 TO 1 TABLET TWICE A DAY AS NEEDED FOR ANXIETY (Patient taking differently: Take 0.125-0.25 mg by mouth 2 (two) times daily as needed for anxiety. TAKE 1/2 TO 1 TABLET  TWICE A DAY AS NEEDED FOR ANXIETY)  . B-D TB SYRINGE 1CC/27GX1/2" 27G X 1/2" 1 ML MISC   . carbidopa-levodopa (SINEMET CR) 50-200 MG tablet TAKE 1 TABLET BY MOUTH EVERYDAY AT BEDTIME  . carbidopa-levodopa (SINEMET IR) 25-100 MG tablet TAKE 2 TABLETS EVERY MORNING, 2 TABLETS IN THE AFTERNOON, AND 1 TABLET IN THE EVENING (Patient taking differently: Take 1-2 tablets by mouth See admin instructions. TAKE 2 TABLETS EVERY MORNING, 2 TABLETS IN THE AFTERNOON, AND 1 TABLET IN THE EVENING)  . Carboxymethylcellul-Glycerin (REFRESH OPTIVE OP) Place 1 drop into both eyes 3 (three) times daily.  . carvedilol (COREG) 3.125 MG tablet Take 1  tablet (3.125 mg total) by mouth 2 (two) times daily with a meal.  . DULoxetine (CYMBALTA) 30 MG capsule TAKE 1 CAPSULE BY MOUTH EVERY DAY (Patient taking differently: Take 30 mg by mouth daily. TAKE 1 CAPSULE BY MOUTH EVERY DAY)  . escitalopram (LEXAPRO) 20 MG tablet TAKE 1 TABLET BY MOUTH EVERY DAY (Patient taking differently: Take 20 mg by mouth daily. )  . folic acid (FOLVITE) 1 MG tablet Take 1 mg by mouth 3 (three) times daily.   . furosemide (LASIX) 20 MG tablet Take 1 tablet (20 mg total) by mouth daily. On Tuesdays and Satudays only  . hydroxychloroquine (PLAQUENIL) 200 MG tablet Take 2 tablets (400 mg total) by mouth daily.  Marland Kitchen losartan (COZAAR) 50 MG tablet Take 1 tablet (50 mg total) by mouth daily.  . Methotrexate Sodium (METHOTREXATE, PF,) 50 MG/2ML injection Inject 20 mg into the muscle once a week.   Marland Kitchen omeprazole (PRILOSEC) 20 MG capsule TAKE 1 CAPSULE BY MOUTH EVERY DAY (Patient taking differently: Take 20 mg by mouth daily. )  . pramipexole (MIRAPEX) 0.5 MG tablet Take 1 tablet (0.5 mg total) by mouth 3 (three) times daily.  . rosuvastatin (CRESTOR) 10 MG tablet TAKE 1 TABLET BY MOUTH ON MONDAYS, WEDNESDAYS AND FRIDAYS (Patient taking differently: Take 10 mg by mouth every Monday, Wednesday, and Friday. )  . [DISCONTINUED] furosemide (LASIX) 20 MG tablet Take 1 tablet (20 mg total) by mouth daily. On Tues ,Thurs and Satudays only (Patient taking differently: Take 20 mg by mouth 3 (three) times a week. On Tues ,Thurs and Satudays only)     Allergies:   Bactrim [sulfamethoxazole-trimethoprim] and Penicillins   Social History   Socioeconomic History  . Marital status: Married    Spouse name: Not on file  . Number of children: 1  . Years of education: Not on file  . Highest education level: Master's degree (e.g., MA, MS, MEng, MEd, MSW, MBA)  Occupational History  . Occupation: Retired Teacher, music  Social Needs  . Financial resource strain: Not on file  . Food insecurity     Worry: Not on file    Inability: Not on file  . Transportation needs    Medical: Not on file    Non-medical: Not on file  Tobacco Use  . Smoking status: Never Smoker  . Smokeless tobacco: Never Used  Substance and Sexual Activity  . Alcohol use: Yes    Alcohol/week: 4.0 standard drinks    Types: 4 Glasses of wine per week    Comment: occasionally  . Drug use: No  . Sexual activity: Not Currently    Partners: Male  Lifestyle  . Physical activity    Days per week: Not on file    Minutes per session: Not on file  . Stress: Not on file  Relationships  .  Social Herbalist on phone: Not on file    Gets together: Not on file    Attends religious service: Not on file    Active member of club or organization: Not on file    Attends meetings of clubs or organizations: Not on file    Relationship status: Not on file  Other Topics Concern  . Not on file  Social History Narrative   Lives with husband, no major dietary restrictions, retired from teaching      Husband dx with cancer MDS June 2017.   Currently in remission. (11/03/16 pc)      Pt has one biological child the other 3 are adopted        Family History: The patient's family history includes Arthritis in her father, sister, sister, and sister; Bipolar disorder in her mother; Cancer in her mother; Depression in her mother and sister; Heart disease in her father; Hyperlipidemia in her father and mother; Hypertension in her father; Mental illness in her mother and sister; Other in her mother; Parkinson's disease in her sister; Thyroid disease in her mother. There is no history of Colon cancer, Esophageal cancer, Rectal cancer, or Stomach cancer.  ROS:   Review of Systems  Constitution: Negative for decreased appetite, fever and weight gain.  HENT: Negative for congestion, ear discharge, hoarse voice and sore throat.   Eyes: Negative for discharge, redness, vision loss in right eye and visual halos.   Cardiovascular: Negative for chest pain, dyspnea on exertion, leg swelling, orthopnea and palpitations.  Respiratory: Negative for cough, hemoptysis, shortness of breath and snoring.   Endocrine: Negative for heat intolerance and polyphagia.  Hematologic/Lymphatic: Negative for bleeding problem. Does not bruise/bleed easily.  Skin: Negative for flushing, nail changes, rash and suspicious lesions.  Musculoskeletal: Negative for arthritis, joint pain, muscle cramps, myalgias, neck pain and stiffness.  Gastrointestinal: Negative for abdominal pain, bowel incontinence, diarrhea and excessive appetite.  Genitourinary: Negative for decreased libido, genital sores and incomplete emptying.  Neurological: Negative for brief paralysis, focal weakness, headaches and loss of balance.  Psychiatric/Behavioral: Negative for altered mental status, depression and suicidal ideas.  Allergic/Immunologic: Negative for HIV exposure and persistent infections.    EKGs/Labs/Other Studies Reviewed:    The following studies were reviewed today:  EKG:  None performed today.   Recent Labs: 11/05/2018: ALT 5; TSH 1.38 05/29/2019: BUN 14; Creatinine, Ser 0.81; Hemoglobin 11.4; Platelets 259; Potassium 4.1; Sodium 144  Recent Lipid Panel    Component Value Date/Time   CHOL 163 11/05/2018 0953   CHOL 180 11/27/2016 1123   TRIG 87.0 11/05/2018 0953   HDL 64.80 11/05/2018 0953   HDL 57 11/27/2016 1123   CHOLHDL 3 11/05/2018 0953   VLDL 17.4 11/05/2018 0953   LDLCALC 81 11/05/2018 0953   LDLCALC 88 11/27/2016 1123   LDLDIRECT 161.5 10/07/2008 0944    Physical Exam:    VS:  BP 100/64 (BP Location: Left Arm, Patient Position: Sitting, Cuff Size: Normal)   Pulse 76   Ht 5\' 4"  (1.626 m)   Wt 245 lb (111.1 kg)   SpO2 96%   BMI 42.05 kg/m     Wt Readings from Last 3 Encounters:  06/12/19 245 lb (111.1 kg)  06/02/19 245 lb (111.1 kg)  05/22/19 245 lb (111.1 kg)     GEN: Well nourished, well developed in no  acute distress HEENT: Normal NECK: No JVD; No carotid bruits LYMPHATICS: No lymphadenopathy CARDIAC: S1S2 noted,RRR, no murmurs, rubs, gallops RESPIRATORY:  Clear to auscultation without rales, wheezing or rhonchi  ABDOMEN: Soft, non-tender, non-distended, +bowel sounds, no guarding. EXTREMITIES: No edema, No cyanosis, no clubbing MUSCULOSKELETAL:  No edema; No deformity  SKIN: Warm and dry NEUROLOGIC:  Alert and oriented x 3, non-focal PSYCHIATRIC:  Normal affect, good insight  ASSESSMENT:    1. Coronary artery disease involving native coronary artery of native heart without angina pectoris   2. Diastolic dysfunction   3. Mixed hyperlipidemia   4. Class 1 obesity without serious comorbidity with body mass index (BMI) of 30.0 to 30.9 in adult, unspecified obesity type    PLAN:    1.  I discussed with patient about her diagnosis of mild coronary artery disease.  I did explained to her that slowing down progression is heavily based on preventative cardiovascular medicine.  She will start on aspirin 81 mg daily as well as continue taking current dose of Crestor.   2.  In addition he has had great improvement on the Lasix 20 mg Tuesday, Thursday and Saturdays, but we will back off a little bit and now cautiously diurese her 2 days a week Tuesdays and Saturdays.  3.  The patient understands the need to lose weight with diet and exercise. We have discussed specific strategies for this.   The patient is in agreement with the above plan. The patient left the office in stable condition.  The patient will follow up in 6 months or sooner if needed.   Medication Adjustments/Labs and Tests Ordered: Current medicines are reviewed at length with the patient today.  Concerns regarding medicines are outlined above.  No orders of the defined types were placed in this encounter.  Meds ordered this encounter  Medications  . furosemide (LASIX) 20 MG tablet    Sig: Take 1 tablet (20 mg total) by  mouth daily. On Tuesdays and Satudays only    Dispense:  72 tablet    Refill:  1  . aspirin EC 81 MG tablet    Sig: Take 1 tablet (81 mg total) by mouth daily.    Dispense:  90 tablet    Refill:  1    Patient Instructions  Medication Instructions:  Your physician has recommended you make the following change in your medication: START Aspirin 81 mg by mouth daily CHANGE Lasix to 20 mg on Tuesdays and Saturdays only  If you need a refill on your cardiac medications before your next appointment, please call your pharmacy.   Lab work: Your physician recommends that you return for lab work  2-3 days before your follow-up visit in 6 months Lipids  If you have labs (blood work) drawn today and your tests are completely normal, you will receive your results only by: Marland Kitchen MyChart Message (if you have MyChart) OR . A paper copy in the mail If you have any lab test that is abnormal or we need to change your treatment, we will call you to review the results.  Testing/Procedures: None Ordered  Follow-Up: At [facility], you and your health needs are our priority.  As part of our continuing mission to provide you with exceptional heart care, we have created designated Provider Care Teams.  These Care Teams include your primary Cardiologist (physician) and Advanced Practice Providers (APPs -  Physician Assistants and Nurse Practitioners) who all work together to provide you with the care you need, when you need it. You will need a follow up appointment in 6 months.  Please call our office 2 months in  advance to schedule this appointment.  You may see Dr. Harriet Masson or another member of our [place] Provider Team in [place]: Shirlee More, MD . Jyl Heinz, MD  Any Other Special Instructions Will Be Listed Below (If Applicable).      Adopting a Healthy Lifestyle.  Know what a healthy weight is for you (roughly BMI <25) and aim to maintain this   Aim for 7+ servings of fruits and  vegetables daily   65-80+ fluid ounces of water or unsweet tea for healthy kidneys   Limit to max 1 drink of alcohol per day; avoid smoking/tobacco   Limit animal fats in diet for cholesterol and heart health - choose grass fed whenever available   Avoid highly processed foods, and foods high in saturated/trans fats   Aim for low stress - take time to unwind and care for your mental health   Aim for 150 min of moderate intensity exercise weekly for heart health, and weights twice weekly for bone health   Aim for 7-9 hours of sleep daily   When it comes to diets, agreement about the perfect plan isnt easy to find, even among the experts. Experts at the [employer] developed an idea known as the Healthy Eating Plate. Just imagine a plate divided into logical, healthy portions.   The emphasis is on diet quality:   Load up on vegetables and fruits - one-half of your plate: Aim for color and variety, and remember that potatoes dont count.   Go for whole grains - one-quarter of your plate: Whole wheat, barley, wheat berries, quinoa, oats, brown rice, and foods made with them. If you want pasta, go with whole wheat pasta.   Protein power - one-quarter of your plate: Fish, chicken, beans, and nuts are all healthy, versatile protein sources. Limit red meat.   The diet, however, does go beyond the plate, offering a few other suggestions.   Use healthy plant oils, such as olive, canola, soy, corn, sunflower and peanut. Check the labels, and avoid partially hydrogenated oil, which have unhealthy trans fats.   If youre thirsty, drink water. Coffee and tea are good in moderation, but skip sugary drinks and limit milk and dairy products to one or two daily servings.   The type of carbohydrate in the diet is more important than the amount. Some sources of carbohydrates, such as vegetables, fruits, whole grains, and beans-are healthier than others.   Finally, stay active   Signed, Berniece Salines, DO  06/12/2019 11:06 PM    [facility]

## 2019-06-12 NOTE — Patient Instructions (Signed)
Medication Instructions:  Your physician has recommended you make the following change in your medication: START Aspirin 81 mg by mouth daily CHANGE Lasix to 20 mg on Tuesdays and Saturdays only  If you need a refill on your cardiac medications before your next appointment, please call your pharmacy.   Lab work: Your physician recommends that you return for lab work  2-3 days before your follow-up visit in 6 months Lipids  If you have labs (blood work) drawn today and your tests are completely normal, you will receive your results only by: Marland Kitchen MyChart Message (if you have MyChart) OR . A paper copy in the mail If you have any lab test that is abnormal or we need to change your treatment, we will call you to review the results.  Testing/Procedures: None Ordered  Follow-Up: At [facility], you and your health needs are our priority.  As part of our continuing mission to provide you with exceptional heart care, we have created designated Provider Care Teams.  These Care Teams include your primary Cardiologist (physician) and Advanced Practice Providers (APPs -  Physician Assistants and Nurse Practitioners) who all work together to provide you with the care you need, when you need it. You will need a follow up appointment in 6 months.  Please call our office 2 months in advance to schedule this appointment.  You may see Dr. Harriet Masson or another member of our [place] Provider Team in [place]: Shirlee More, MD . Jyl Heinz, MD  Any Other Special Instructions Will Be Listed Below (If Applicable).

## 2019-06-13 ENCOUNTER — Ambulatory Visit: Payer: Medicare Other | Admitting: Occupational Therapy

## 2019-06-16 NOTE — Addendum Note (Signed)
Addended by: Polly Cobia A on: 06/16/2019 03:12 PM   Modules accepted: Orders

## 2019-06-17 ENCOUNTER — Ambulatory Visit: Payer: Medicare Other | Admitting: Occupational Therapy

## 2019-06-18 ENCOUNTER — Encounter: Payer: Medicare Other | Admitting: Occupational Therapy

## 2019-06-20 DIAGNOSIS — R0600 Dyspnea, unspecified: Secondary | ICD-10-CM | POA: Diagnosis not present

## 2019-06-20 DIAGNOSIS — M19042 Primary osteoarthritis, left hand: Secondary | ICD-10-CM | POA: Diagnosis not present

## 2019-06-20 DIAGNOSIS — G20 Parkinson's disease: Secondary | ICD-10-CM | POA: Diagnosis not present

## 2019-06-20 DIAGNOSIS — M19041 Primary osteoarthritis, right hand: Secondary | ICD-10-CM | POA: Diagnosis not present

## 2019-06-20 DIAGNOSIS — M069 Rheumatoid arthritis, unspecified: Secondary | ICD-10-CM | POA: Diagnosis not present

## 2019-06-20 DIAGNOSIS — M180 Bilateral primary osteoarthritis of first carpometacarpal joints: Secondary | ICD-10-CM | POA: Diagnosis not present

## 2019-06-20 DIAGNOSIS — Z23 Encounter for immunization: Secondary | ICD-10-CM | POA: Diagnosis not present

## 2019-06-20 DIAGNOSIS — M059 Rheumatoid arthritis with rheumatoid factor, unspecified: Secondary | ICD-10-CM | POA: Diagnosis not present

## 2019-06-20 DIAGNOSIS — Z79899 Other long term (current) drug therapy: Secondary | ICD-10-CM | POA: Diagnosis not present

## 2019-06-27 ENCOUNTER — Other Ambulatory Visit: Payer: Self-pay | Admitting: Family Medicine

## 2019-08-08 ENCOUNTER — Telehealth: Payer: Self-pay

## 2019-08-08 DIAGNOSIS — R1013 Epigastric pain: Secondary | ICD-10-CM | POA: Diagnosis not present

## 2019-08-08 NOTE — Telephone Encounter (Signed)
Copied from [place] [id number]. Topic: General - Other >> Aug 08, 2019  3:27 PM Keene Breath wrote: Reason for CRM: Patient stated that she had to go to the ER because she thought she was having a heart attack but it turned out to be very bad heart burn.  Patient would like the nurse to call her to discuss further.  CB# [phone]

## 2019-08-11 ENCOUNTER — Other Ambulatory Visit: Payer: Self-pay | Admitting: Neurology

## 2019-08-12 NOTE — Telephone Encounter (Signed)
Called patient left voicemail for patient to call the office back

## 2019-08-18 ENCOUNTER — Telehealth: Payer: Self-pay | Admitting: Neurology

## 2019-08-18 NOTE — Telephone Encounter (Signed)
Patient called regarding her waking up at night with her legs havingTremors and then she cannot go back to sleep. She said she takes her Carbidopa Levodopa at 6:00 AM. She is wondering if there is another it can last longer? Please Call. Thank you

## 2019-08-18 NOTE — Telephone Encounter (Signed)
Pt should be on carbidopa/levodopa 25/100, 2 in the AM, 2 in the afternoon, 1 in the evening and carbidopa/levodopa 50/200 at bedtime, correct?  If waking up with tremor, she can take extra 1/2 tablet of the daytime one (carbidopa/levodopa 25/100)  when she wakes up and chew it.Kristin [name]

## 2019-08-19 ENCOUNTER — Other Ambulatory Visit: Payer: Self-pay | Admitting: Family Medicine

## 2019-08-19 NOTE — Telephone Encounter (Signed)
Mailbox was full and unable to leave a message

## 2019-08-19 NOTE — Telephone Encounter (Signed)
Verified and patient is taking medication correctly. She will try chewing the medication and let the office know in a few days if it does not help. She is also increasing her exercise

## 2019-08-22 ENCOUNTER — Other Ambulatory Visit: Payer: Self-pay | Admitting: Family Medicine

## 2019-08-22 NOTE — Telephone Encounter (Signed)
Last OV 04/21/19 Last refill Losartan - change in therapy 04/03/19 to 50 mg                 Duloxetine 03/10/19 #90/1 Next OV not scheduled

## 2019-09-12 NOTE — BH Specialist Note (Signed)
LCSW provided patient education and resources including information about Parkinson's online support groups and exercise options

## 2019-10-01 DIAGNOSIS — M8949 Other hypertrophic osteoarthropathy, multiple sites: Secondary | ICD-10-CM | POA: Diagnosis not present

## 2019-10-01 DIAGNOSIS — Z79899 Other long term (current) drug therapy: Secondary | ICD-10-CM | POA: Diagnosis not present

## 2019-10-01 DIAGNOSIS — M059 Rheumatoid arthritis with rheumatoid factor, unspecified: Secondary | ICD-10-CM | POA: Diagnosis not present

## 2019-10-01 DIAGNOSIS — R7982 Elevated C-reactive protein (CRP): Secondary | ICD-10-CM | POA: Diagnosis not present

## 2019-10-07 DIAGNOSIS — M059 Rheumatoid arthritis with rheumatoid factor, unspecified: Secondary | ICD-10-CM | POA: Diagnosis not present

## 2019-10-07 DIAGNOSIS — Z79899 Other long term (current) drug therapy: Secondary | ICD-10-CM | POA: Diagnosis not present

## 2019-10-07 DIAGNOSIS — G20 Parkinson's disease: Secondary | ICD-10-CM | POA: Diagnosis not present

## 2019-10-07 DIAGNOSIS — M8949 Other hypertrophic osteoarthropathy, multiple sites: Secondary | ICD-10-CM | POA: Diagnosis not present

## 2019-10-07 NOTE — Progress Notes (Signed)
Virtual Visit via Video Note The purpose of this virtual visit is to provide medical care while limiting exposure to the novel coronavirus.    Consent was obtained for video visit:  Yes.   Answered questions that patient had about telehealth interaction:  Yes.   I discussed the limitations, risks, security and privacy concerns of performing an evaluation and management service by telemedicine. I also discussed with the patient that there may be a patient responsible charge related to this service. The patient expressed understanding and agreed to proceed.  Pt location: Home Physician Location: home Name of referring provider:  Mosie Lukes, MD I connected with Kristin [name] at patients initiation/request on 10/09/2019 at 10:45 AM EST by video enabled telemedicine application and verified that I am speaking with the correct person using two identifiers. Pt MRN:  [id number] Pt DOB:  [birth year] Video Participants:  Kristin [name] Kristin [name];     History of Present Illness:  Patient seen today in follow-up for Parkinson's disease.  My previous records as well as any outside records made available were reviewed prior to todays visit.  Patient did have a nuclear stress test which was reported to be abnormal for ischemia.  She subsequently had a heart catheterization, which just showed mild coronary artery disease.  She started Lasix, which improved her shortness of breath but she tells me today she is only on it one day a week.   She does state that she had an episode of SOB yesterday going to get her vaccine.  When asked if she thought that it was a panic attack, she states that "I think that it was part of it."  In regards to Parkinsons Disease, she is exercising but it does hurt her hips to walk and "I need to exercise more."  She is signed up for the online cycle class.  She is doing the health coaching pilates.  No falls.  No hallucinations.  Wakens at 3am with tremor.  Chews 1/2 carbidopa/levodopa 25/100  and works but she often doesn't go back to sleep and gets into a bad pattern of staying up.  Current movement d/o meds:  Carbidopa/levodopa 25/100, 2 tablets in the morning, 2 in the afternoon and 1 in the evening Carbidopa/levodopa 50/200 at bedtime Pramipexole 0.5 mg 3 times per day   Current Outpatient Medications on File Prior to Visit  Medication Sig Dispense Refill   albuterol (VENTOLIN HFA) 108 (90 Base) MCG/ACT inhaler Inhale 2 puffs into the lungs every 6 (six) hours as needed for wheezing or shortness of breath. 18 g 2   ALPRAZolam (XANAX) 0.25 MG tablet TAKE 1/2 TO 1 TABLET TWICE A DAY AS NEEDED FOR ANXIETY (Patient taking differently: Take 0.125-0.25 mg by mouth 2 (two) times daily as needed for anxiety. TAKE 1/2 TO 1 TABLET TWICE A DAY AS NEEDED FOR ANXIETY) 30 tablet 1   aspirin EC 81 MG tablet Take 1 tablet (81 mg total) by mouth daily. 90 tablet 1   B-D TB SYRINGE 1CC/27GX1/2" 27G X 1/2" 1 ML MISC      carbidopa-levodopa (SINEMET CR) 50-200 MG tablet TAKE 1 TABLET BY MOUTH EVERYDAY AT BEDTIME 90 tablet 1   carbidopa-levodopa (SINEMET IR) 25-100 MG tablet TAKE 2 TABLETS EVERY MORNING, 2 TABLETS IN THE AFTERNOON, AND 1 TABLET IN THE EVENING (Patient taking differently: Take 1-2 tablets by mouth See admin instructions. TAKE 2 TABLETS EVERY MORNING, 2 TABLETS IN THE AFTERNOON, AND 1 TABLET IN THE EVENING) 450 tablet 1  Carboxymethylcellul-Glycerin (REFRESH OPTIVE OP) Place 1 drop into both eyes 3 (three) times daily.     carvedilol (COREG) 3.125 MG tablet TAKE 1 TABLET BY MOUTH TWICE A DAY WITH A MEAL 180 tablet 1   DULoxetine (CYMBALTA) 30 MG capsule TAKE 1 CAPSULE BY MOUTH EVERY DAY 90 capsule 1   escitalopram (LEXAPRO) 20 MG tablet TAKE 1 TABLET BY MOUTH EVERY DAY (Patient taking differently: Take 20 mg by mouth daily. ) 90 tablet 2   folic acid (FOLVITE) 1 MG tablet Take 1 mg by mouth 3 (three) times daily.      hydroxychloroquine (PLAQUENIL) 200 MG tablet Take 2  tablets (400 mg total) by mouth daily.     losartan (COZAAR) 50 MG tablet Take 1 tablet (50 mg total) by mouth daily. 90 tablet 3   Methotrexate Sodium (METHOTREXATE, PF,) 50 MG/2ML injection Inject 20 mg into the muscle once a week.      omeprazole (PRILOSEC) 20 MG capsule TAKE 1 CAPSULE BY MOUTH EVERY DAY 90 capsule 1   pramipexole (MIRAPEX) 0.5 MG tablet TAKE 1 TABLET BY MOUTH 3 TIMES A DAY 270 tablet 1   rosuvastatin (CRESTOR) 10 MG tablet TAKE 1 TABLET BY MOUTH ON MONDAYS, WEDNESDAYS AND FRIDAYS 54 tablet 1   furosemide (LASIX) 20 MG tablet Take 1 tablet (20 mg total) by mouth daily. On Tuesdays and Satudays only 72 tablet 1   No current facility-administered medications on file prior to visit.     Observations/Objective:   There were no vitals filed for this visit. GEN:  The patient appears stated age and is in NAD.  Neurological examination:  Orientation: The patient is alert and oriented x3. Cranial nerves: There is good facial symmetry. There is no significant facial hypomimia.  The speech is fluent and clear. Soft palate rises symmetrically and there is no tongue deviation. Hearing is intact to conversational tone. Motor: Strength is at least antigravity x 4.   Shoulder shrug is equal and symmetric.  There is no pronator drift.  Movement examination: Tone: unable Abnormal movements: there is RUE rest tremor Coordination:  There is no decremation with RAM's, with any form of RAMS, including alternating supination and pronation of the forearm, hand opening and closing, finger taps Gait and Station: The patient has no difficulty arising out of a deep-seated chair without the use of the hands. The patient's stride length is good with good arm swing bilaterally.    I have reviewed and interpreted the following labs independently     Chemistry      Component Value Date/Time   NA 144 05/29/2019 1222   K 4.1 05/29/2019 1222   CL 105 05/29/2019 1222   CO2 24 05/29/2019  1222   BUN 14 05/29/2019 1222   CREATININE 0.81 05/29/2019 1222   CREATININE 0.71 01/04/2015 1046      Component Value Date/Time   CALCIUM 9.5 05/29/2019 1222   ALKPHOS 91 11/05/2018 0953   AST 18 11/05/2018 0953   ALT 5 11/05/2018 0953   BILITOT 0.5 11/05/2018 0953   BILITOT 0.4 11/27/2016 1123     Lab Results  Component Value Date   TSH 1.38 11/05/2018     Assessment and Plan:   1. Parkinsons Disease -Continue pramipexole 0.5 mg 3 times per day -Continue carbidopa/levodopa 25/100, 2/2/1  -chew 1/2-1 carbidopa/levodopa 25/100 in middle of night if awakens with tremor.  This has worked -She will continue Carbidopa/levodopa 50/200 at night. This has definitely helped restless leg and has  helped most of the nighttime cramping of the feet and legs. 2. Depression and anxiety - She is on a combination of Lexapro and Cymbalta 3. Dizziness -improved 4. Dysphagia -last MBE 12/2015. Liquids becoming slightly more problematic. Talked about chin tuck maneuver. Use straw.  5. Rheumatoid arthritis -now on Plaquenil.             -discussed f/u with ophthalmology and she has appt in 2 weeks 6.  SOB             -Improved with Lasix.  Heart catheterization was fairly unrevealing. 7.  REM behavior disorder             -This is commonly associated with PD and the patient is experiencing this.  She just had a fall out of the bed.   We discussed that this can be very serious and even harmful.  We talked about medications as well as physical barriers to put in the bed (particularly soft bed rails, pillow barriers).  We talked about moving the night stand so that it is not so close to the side of the bed.  She will start with the bed rails.  She doesn't want more meds right now.   8.  Constipation  -discussed nature and pathophysiology and association with PD  -discussed importance of hydration.  Pt is to  increase water intake  -pt is given a copy of the rancho recipe  -recommended daily colace  -recommended miralax prn  -going to f/u with PCP re: possible linzess 9.  Insomnia  -discussed sleep habits  -add melatonin.  States pharmacist told her not to with SSRI.  I haven't heard that.  She is going to ask PCP if ok.   Follow Up Instructions:  5 months  -I discussed the assessment and treatment plan with the patient. The patient was provided an opportunity to ask questions and all were answered. The patient agreed with the plan and demonstrated an understanding of the instructions.   The patient was advised to call back or seek an in-person evaluation if the symptoms worsen or if the condition fails to improve as anticipated.    Total time spent on today's visit was 44minutes, including both face-to-face time and nonface-to-face time.  Time included that spent on review of records (prior notes available to me/labs/imaging if pertinent), discussing treatment and goals, answering patient's questions and coordinating care.   Alonza Bogus, DO

## 2019-10-08 ENCOUNTER — Encounter: Payer: Self-pay | Admitting: Neurology

## 2019-10-09 ENCOUNTER — Other Ambulatory Visit: Payer: Self-pay

## 2019-10-09 ENCOUNTER — Telehealth (INDEPENDENT_AMBULATORY_CARE_PROVIDER_SITE_OTHER): Payer: Medicare Other | Admitting: Neurology

## 2019-10-09 DIAGNOSIS — G20 Parkinson's disease: Secondary | ICD-10-CM

## 2019-10-09 DIAGNOSIS — G4701 Insomnia due to medical condition: Secondary | ICD-10-CM | POA: Diagnosis not present

## 2019-10-09 DIAGNOSIS — K5901 Slow transit constipation: Secondary | ICD-10-CM

## 2019-10-16 ENCOUNTER — Ambulatory Visit: Payer: Medicare Other

## 2019-10-22 NOTE — Progress Notes (Signed)
Virtual Visit via Audio Note  I connected with patient on 10/23/19 at  1:00 PM EST by audio enabled telemedicine application and verified that I am speaking with the correct person using two identifiers.   THIS ENCOUNTER IS A VIRTUAL VISIT DUE TO COVID-19 - PATIENT WAS NOT SEEN IN THE OFFICE. PATIENT HAS CONSENTED TO VIRTUAL VISIT / TELEMEDICINE VISIT   Location of patient: home  Location of provider: office  I discussed the limitations of evaluation and management by telemedicine and the availability of in person appointments. The patient expressed understanding and agreed to proceed.   Subjective:   Kristin [name] is a 69 y.o. female who presents for Medicare Annual (Subsequent) preventive examination.  Pt participates in Parkinson's support group 2x/ month on zoom.    Review of Systems:  Cardiac Risk Factors include: advanced age (>11men, >68 women);dyslipidemia;hypertension  Home Safety/Smoke Alarms: Feels safe in home. Smoke alarms in place.  Lives w/ husband (dx w/ Leukemia 2017) and grown son ( has high functioning autism) in 2 story home. Master on 1st.   Female:       Mammo-03/21/18. ordered       Dexa scan-   12/13/15. Ordered CCS-10/03/12. Recall  10 yrs.     Objective:     Vitals: BP 110/70 Comment: pt reported vitals   Advanced Directives 10/23/2019 06/02/2019 05/05/2019 05/05/2019 10/17/2018 07/29/2018 06/14/2018  Does Patient Have a Medical Advance Directive? Yes Yes Yes Yes Yes Yes Yes  Type of Paramedic of [place];Living will [place];Living will Living will;Healthcare Power of Attorney Living will;Healthcare Power of [place];Living will [place];Living will [place];Living will  Does patient want to make changes to medical advance directive? No - Patient declined No - Patient declined No - Patient declined - No - Patient declined - -  Copy of  [place] in Chart? Yes - validated most recent copy scanned in chart (See row information) Yes - validated most recent copy scanned in chart (See row information) - - Yes - validated most recent copy scanned in chart (See row information) - -    Tobacco Social History   Tobacco Use  Smoking Status Never Smoker  Smokeless Tobacco Never Used     Counseling given: Not Answered   Clinical Intake: Pain : No/denies pain     Past Medical History:  Diagnosis Date  . Abnormal vaginal Pap smear   . Acute pharyngitis 02/25/2013  . Anemia   . Anxiety   . Arthritis   . 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 Chicken pox as a child  . Chronic UTI    sees dr Terance Hart  . Constipation 12/07/2015  . Depression with anxiety 11/02/2009   Qualifier: Diagnosis of  By: [name], LPN, [name]   . Dermatitis 07/17/2012  . Esophageal stricture 1994  . Fibroids   . Foot pain, bilateral 06/19/2012  . GERD (gastroesophageal reflux disease)   . GERD (gastroesophageal reflux disease)   . Hiatal hernia   . Hyperglycemia 08/19/2013  . 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 Hyperlipidemia   . Hypertension   . Infertility, female   . Low back pain 10/17/2007   Qualifier: Diagnosis of  By: [name], [name]   . Measles as a child  . Obesity   . Osteoarthritis   . Parkinson disease ([place])   . Plantar fasciitis of left foot  06/19/2012  . Preventative health care 12/19/2015  . Rheumatoid arthritis ([place]) 01/08/2018  . Rosacea 10/05/2014  . Swallowing difficulty   . Urinary frequency 02/25/2013  . Visual floaters 05/11/2014   Past Surgical History:  Procedure Laterality Date  . ABDOMINAL HYSTERECTOMY  2006   total  . esophageal     stretching  . laporoscopy    . LEFT HEART CATH AND CORONARY ANGIOGRAPHY N/A 06/02/2019   Procedure: LEFT HEART CATH AND CORONARY ANGIOGRAPHY;  Surgeon: Jettie Booze, MD;  Location: [facility];  Service: Cardiovascular;   Laterality: N/A;  . TONSILLECTOMY    . TOTAL HIP ARTHROPLASTY Right 2010  . wisdom teeth extracted     Family History  Problem Relation Age of Onset  . Cancer Mother        breast  . Other Mother        arrythmia  . Mental illness Mother        bipolar  . Hyperlipidemia Mother   . Thyroid disease Mother   . Depression Mother   . Bipolar disorder Mother   . Heart disease Father   . Arthritis Father        rheumatoid  . Hypertension Father   . Hyperlipidemia Father   . Depression Sister   . Mental illness Sister        bipolar  . Parkinson's disease Sister   . Arthritis Sister   . Arthritis Sister   . Arthritis Sister   . Colon cancer Neg Hx   . Esophageal cancer Neg Hx   . Rectal cancer Neg Hx   . Stomach cancer Neg Hx    Social History   Socioeconomic History  . Marital status: Married    Spouse name: Not on file  . Number of children: 1  . Years of education: Not on file  . Highest education level: Master's degree (e.g., MA, MS, MEng, MEd, MSW, MBA)  Occupational History  . Occupation: Retired Teacher, music  Tobacco Use  . Smoking status: Never Smoker  . Smokeless tobacco: Never Used  Substance and Sexual Activity  . Alcohol use: Yes    Alcohol/week: 4.0 standard drinks    Types: 4 Glasses of wine per week    Comment: occasionally  . Drug use: No  . Sexual activity: Not Currently    Partners: Male  Other Topics Concern  . Not on file  Social History Narrative   Lives with husband, no major dietary restrictions, retired from teaching      Husband dx with cancer MDS June 2017.   Currently in remission. (11/03/16 pc)      Pt has one biological child the other 3 are adopted      Social Determinants of Health   Financial Resource Strain:   . Difficulty of Paying Living Expenses: Not on file  Food Insecurity:   . Worried About Charity fundraiser in the Last Year: Not on file  . Ran Out of Food in the Last Year: Not on file  Transportation Needs:    . Lack of Transportation (Medical): Not on file  . Lack of Transportation (Non-Medical): Not on file  Physical Activity:   . Days of Exercise per Week: Not on file  . Minutes of Exercise per Session: Not on file  Stress:   . Feeling of Stress : Not on file  Social Connections:   . Frequency of Communication with Friends and Family: Not on file  . Frequency of  Social Gatherings with Friends and Family: Not on file  . Attends Religious Services: Not on file  . Active Member of Clubs or Organizations: Not on file  . Attends Archivist Meetings: Not on file  . Marital Status: Not on file    Outpatient Encounter Medications as of 10/23/2019  Medication Sig  . ALPRAZolam (XANAX) 0.25 MG tablet TAKE 1/2 TO 1 TABLET TWICE A DAY AS NEEDED FOR ANXIETY (Patient taking differently: Take 0.125-0.25 mg by mouth 2 (two) times daily as needed for anxiety. TAKE 1/2 TO 1 TABLET TWICE A DAY AS NEEDED FOR ANXIETY)  . aspirin EC 81 MG tablet Take 1 tablet (81 mg total) by mouth daily.  . B-D TB SYRINGE 1CC/27GX1/2" 27G X 1/2" 1 ML MISC   . carbidopa-levodopa (SINEMET CR) 50-200 MG tablet TAKE 1 TABLET BY MOUTH EVERYDAY AT BEDTIME  . carbidopa-levodopa (SINEMET IR) 25-100 MG tablet TAKE 2 TABLETS EVERY MORNING, 2 TABLETS IN THE AFTERNOON, AND 1 TABLET IN THE EVENING (Patient taking differently: Take 1-2 tablets by mouth See admin instructions. TAKE 2 TABLETS EVERY MORNING, 2 TABLETS IN THE AFTERNOON, AND 1 TABLET IN THE EVENING)  . Carboxymethylcellul-Glycerin (REFRESH OPTIVE OP) Place 1 drop into both eyes 3 (three) times daily.  . carvedilol (COREG) 3.125 MG tablet TAKE 1 TABLET BY MOUTH TWICE A DAY WITH A MEAL  . DULoxetine (CYMBALTA) 30 MG capsule TAKE 1 CAPSULE BY MOUTH EVERY DAY  . escitalopram (LEXAPRO) 20 MG tablet TAKE 1 TABLET BY MOUTH EVERY DAY (Patient taking differently: Take 20 mg by mouth daily. )  . folic acid (FOLVITE) 1 MG tablet Take 1 mg by mouth 3 (three) times daily.   .  hydroxychloroquine (PLAQUENIL) 200 MG tablet Take 2 tablets (400 mg total) by mouth daily.  Marland Kitchen losartan (COZAAR) 50 MG tablet Take 1 tablet (50 mg total) by mouth daily.  . Methotrexate Sodium (METHOTREXATE, PF,) 50 MG/2ML injection Inject 20 mg into the muscle once a week.   Marland Kitchen omeprazole (PRILOSEC) 20 MG capsule TAKE 1 CAPSULE BY MOUTH EVERY DAY  . pramipexole (MIRAPEX) 0.5 MG tablet TAKE 1 TABLET BY MOUTH 3 TIMES A DAY  . rosuvastatin (CRESTOR) 10 MG tablet TAKE 1 TABLET BY MOUTH ON MONDAYS, WEDNESDAYS AND FRIDAYS  . albuterol (VENTOLIN HFA) 108 (90 Base) MCG/ACT inhaler Inhale 2 puffs into the lungs every 6 (six) hours as needed for wheezing or shortness of breath. (Patient not taking: Reported on 10/23/2019)  . furosemide (LASIX) 20 MG tablet Take 1 tablet (20 mg total) by mouth daily. On Tuesdays and Satudays only   No facility-administered encounter medications on file as of 10/23/2019.    Activities of Daily Living In your present state of health, do you have any difficulty performing the following activities: 10/23/2019  Hearing? N  Vision? N  Difficulty concentrating or making decisions? N  Walking or climbing stairs? N  Dressing or bathing? N  Doing errands, shopping? N  Preparing Food and eating ? N  Using the Toilet? N  In the past six months, have you accidently leaked urine? N  Do you have problems with loss of bowel control? N  Managing your Medications? N  Managing your Finances? N  Housekeeping or managing your Housekeeping? N  Some recent data might be hidden    Patient Care Team: Mosie Lukes, MD as PCP - General (Family Medicine) Tat, Eustace Quail, DO as Consulting Physician (Neurology) Tat, Eustace Quail, DO as Consulting Physician (Neurology)  Assessment:   This is a routine wellness examination for [facility]. Physical assessment deferred to PCP.  Exercise Activities and Dietary recommendations Current Exercise Habits: Home exercise routine, Type of exercise:  yoga;stretching, Time (Minutes): 30, Frequency (Times/Week): 6, Weekly Exercise (Minutes/Week): 180, Intensity: Mild, Exercise limited by: None identified Diet (meal preparation, eat out, water intake, caffeinated beverages, dairy products, fruits and vegetables): in general, a "healthy" diet  , well balanced   Goals    . Continue weight watcher    . Increase physical activity     Exercise 4-6 days per week    . Keep a postive mindset. (pt-stated)       Fall Risk Fall Risk  10/23/2019 05/05/2019 12/17/2018 10/17/2018 08/15/2018  Falls in the past year? 0 0 0 0 0  Number falls in past yr: 0 0 0 - 0  Injury with Fall? 0 0 0 - 0  Follow up Education provided;Falls prevention discussed - - - Falls evaluation completed   Depression Screen PHQ 2/9 Scores 10/23/2019 10/17/2018 10/15/2017 05/10/2017  PHQ - 2 Score 0 2 2 0  PHQ- 9 Score - 3 2 0     Cognitive Function Ad8 score reviewed for issues:  Issues making decisions:no  Less interest in hobbies / activities:no  Repeats questions, stories (family complaining):no  Trouble using ordinary gadgets (microwave, computer, phone):no  Forgets the month or year: no  Mismanaging finances: no  Remembering appts:no  Daily problems with thinking and/or memory:no Ad8 score is=0     MMSE - Mini Mental State Exam 10/15/2017  Orientation to time 5  Orientation to Place 5  Registration 3  Attention/ Calculation 5  Recall 3  Language- name 2 objects 2  Language- repeat 1  Language- follow 3 step command 3  Language- read & follow direction 1  Write a sentence 1  Copy design 1  Total score 30        Immunization History  Administered Date(s) Administered  . Influenza Split 06/19/2012  . Influenza Whole 07/05/2007, 07/08/2009  . Influenza, High Dose Seasonal PF 05/04/2016, 05/10/2017, 05/07/2018, 04/29/2019  . Influenza,inj,Quad PF,6+ Mos 07/10/2013, 05/05/2014  . Pneumococcal Conjugate-13 07/10/2013  . Pneumococcal  Polysaccharide-23 12/07/2015  . Td 09/04/2004  . Tdap 01/04/2015  . Zoster Recombinat (Shingrix) 12/20/2017, 03/11/2018   Screening Tests Health Maintenance  Topic Date Due  . MAMMOGRAM  03/21/2020  . COLONOSCOPY  10/03/2022  . TETANUS/TDAP  01/03/2025  . INFLUENZA VACCINE  Completed  . DEXA SCAN  Completed  . Hepatitis C Screening  Completed  . PNA vac Low Risk Adult  Completed      Plan:  See you next year!  Continue to eat heart healthy diet (full of fruits, vegetables, whole grains, lean protein, water--limit salt, fat, and sugar intake) and increase physical activity as tolerated.  Continue doing brain stimulating activities (puzzles, reading, adult coloring books, staying active) to keep memory sharp.   Please schedule your mammogram and bone density scan.  I have personally reviewed and noted the following in the patient's chart:   . Medical and social history . Use of alcohol, tobacco or illicit drugs  . Current medications and supplements . Functional ability and status . Nutritional status . Physical activity . Advanced directives . List of other physicians . Hospitalizations, surgeries, and ER visits in previous 12 months . Vitals . Screenings to include cognitive, depression, and falls . Referrals and appointments  In addition, I have reviewed and discussed with patient certain preventive protocols,  quality metrics, and best practice recommendations. A written personalized care plan for preventive services as well as general preventive health recommendations were provided to patient.     Naaman Plummer [place], [place]  10/23/2019

## 2019-10-23 ENCOUNTER — Encounter: Payer: Self-pay | Admitting: *Deleted

## 2019-10-23 ENCOUNTER — Ambulatory Visit (INDEPENDENT_AMBULATORY_CARE_PROVIDER_SITE_OTHER): Payer: Medicare Other | Admitting: *Deleted

## 2019-10-23 ENCOUNTER — Other Ambulatory Visit: Payer: Self-pay

## 2019-10-23 VITALS — BP 110/70

## 2019-10-23 DIAGNOSIS — Z0000 Encounter for general adult medical examination without abnormal findings: Secondary | ICD-10-CM | POA: Diagnosis not present

## 2019-10-23 DIAGNOSIS — Z1231 Encounter for screening mammogram for malignant neoplasm of breast: Secondary | ICD-10-CM

## 2019-10-23 DIAGNOSIS — Z780 Asymptomatic menopausal state: Secondary | ICD-10-CM

## 2019-10-23 NOTE — Patient Instructions (Signed)
See you next year!  Continue to eat heart healthy diet (full of fruits, vegetables, whole grains, lean protein, water--limit salt, fat, and sugar intake) and increase physical activity as tolerated.  Continue doing brain stimulating activities (puzzles, reading, adult coloring books, staying active) to keep memory sharp.   Please schedule your mammogram and bone density scan.   Ms. Kristin [name] , Thank you for taking time to come for your Medicare Wellness Visit. I appreciate your ongoing commitment to your health goals. Please review the following plan we discussed and let me know if I can assist you in the future.   These are the goals we discussed: Goals    . Continue weight watcher    . Increase physical activity     Exercise 4-6 days per week    . Keep a postive mindset. (pt-stated)       This is a list of the screening recommended for you and due dates:  Health Maintenance  Topic Date Due  . Mammogram  03/21/2020  . Colon Cancer Screening  10/03/2022  . Tetanus Vaccine  01/03/2025  . Flu Shot  Completed  . DEXA scan (bone density measurement)  Completed  .  Hepatitis C: One time screening is recommended by Center for Disease Control  (CDC) for  adults born from [birth year] through 1966.   Completed  . Pneumonia vaccines  Completed    Preventive Care 70 Years and Older, Female Preventive care refers to lifestyle choices and visits with your health care provider that can promote health and wellness. This includes:  A yearly physical exam. This is also called an annual well check.  Regular dental and eye exams.  Immunizations.  Screening for certain conditions.  Healthy lifestyle choices, such as diet and exercise. What can I expect for my preventive care visit? Physical exam Your health care provider will check:  Height and weight. These may be used to calculate body mass index (BMI), which is a measurement that tells if you are at a healthy weight.  Heart rate and blood  pressure.  Your skin for abnormal spots. Counseling Your health care provider may ask you questions about:  Alcohol, tobacco, and drug use.  Emotional well-being.  Home and relationship well-being.  Sexual activity.  Eating habits.  History of falls.  Memory and ability to understand (cognition).  Work and work Statistician.  Pregnancy and menstrual history. What immunizations do I need?  Influenza (flu) vaccine  This is recommended every year. Tetanus, diphtheria, and pertussis (Tdap) vaccine  You may need a Td booster every 10 years. Varicella (chickenpox) vaccine  You may need this vaccine if you have not already been vaccinated. Zoster (shingles) vaccine  You may need this after age 1. Pneumococcal conjugate (PCV13) vaccine  One dose is recommended after age 99. Pneumococcal polysaccharide (PPSV23) vaccine  One dose is recommended after age 7. Measles, mumps, and rubella (MMR) vaccine  You may need at least one dose of MMR if you were born in 1957 or later. You may also need a second dose. Meningococcal conjugate (MenACWY) vaccine  You may need this if you have certain conditions. Hepatitis A vaccine  You may need this if you have certain conditions or if you travel or work in places where you may be exposed to hepatitis A. Hepatitis B vaccine  You may need this if you have certain conditions or if you travel or work in places where you may be exposed to hepatitis B. Haemophilus influenzae type b (  Hib) vaccine  You may need this if you have certain conditions. You may receive vaccines as individual doses or as more than one vaccine together in one shot (combination vaccines). Talk with your health care provider about the risks and benefits of combination vaccines. What tests do I need? Blood tests  Lipid and cholesterol levels. These may be checked every 5 years, or more frequently depending on your overall health.  Hepatitis C test.  Hepatitis B  test. Screening  Lung cancer screening. You may have this screening every year starting at age 70 if you have a 30-pack-year history of smoking and currently smoke or have quit within the past 15 years.  Colorectal cancer screening. All adults should have this screening starting at age 70 and continuing until age 70. Your health care provider may recommend screening at age 70 if you are at increased risk. You will have tests every 1-10 years, depending on your results and the type of screening test.  Diabetes screening. This is done by checking your blood sugar (glucose) after you have not eaten for a while (fasting). You may have this done every 1-3 years.  Mammogram. This may be done every 1-2 years. Talk with your health care provider about how often you should have regular mammograms.  BRCA-related cancer screening. This may be done if you have a family history of breast, ovarian, tubal, or peritoneal cancers. Other tests  Sexually transmitted disease (STD) testing.  Bone density scan. This is done to screen for osteoporosis. You may have this done starting at age 70. Follow these instructions at home: Eating and drinking  Eat a diet that includes fresh fruits and vegetables, whole grains, lean protein, and low-fat dairy products. Limit your intake of foods with high amounts of sugar, saturated fats, and salt.  Take vitamin and mineral supplements as recommended by your health care provider.  Do not drink alcohol if your health care provider tells you not to drink.  If you drink alcohol: ? Limit how much you have to 0-1 drink a day. ? Be aware of how much alcohol is in your drink. In the [place] one drink equals one 12 oz bottle of beer (355 mL), one 5 oz glass of wine (148 mL), or one 1 oz glass of hard liquor (44 mL). Lifestyle  Take daily care of your teeth and gums.  Stay active. Exercise for at least 30 minutes on 5 or more days each week.  Do not use any products that  contain nicotine or tobacco, such as cigarettes, e-cigarettes, and chewing tobacco. If you need help quitting, ask your health care provider.  If you are sexually active, practice safe sex. Use a condom or other form of protection in order to prevent STIs (sexually transmitted infections).  Talk with your health care provider about taking a low-dose aspirin or statin. What's next?  Go to your health care provider once a year for a well check visit.  Ask your health care provider how often you should have your eyes and teeth checked.  Stay up to date on all vaccines. This information is not intended to replace advice given to you by your health care provider. Make sure you discuss any questions you have with your health care provider. Document Revised: 08/15/2018 Document Reviewed: 08/15/2018 Elsevier Patient Education  2020 [employer].

## 2019-10-30 DIAGNOSIS — Z23 Encounter for immunization: Secondary | ICD-10-CM | POA: Diagnosis not present

## 2019-11-16 ENCOUNTER — Other Ambulatory Visit: Payer: Self-pay | Admitting: Neurology

## 2019-11-25 ENCOUNTER — Ambulatory Visit (INDEPENDENT_AMBULATORY_CARE_PROVIDER_SITE_OTHER): Payer: Medicare Other | Admitting: Family Medicine

## 2019-11-25 ENCOUNTER — Other Ambulatory Visit: Payer: Self-pay

## 2019-11-25 DIAGNOSIS — E559 Vitamin D deficiency, unspecified: Secondary | ICD-10-CM

## 2019-11-25 DIAGNOSIS — G20 Parkinson's disease: Secondary | ICD-10-CM

## 2019-11-25 DIAGNOSIS — R7303 Prediabetes: Secondary | ICD-10-CM

## 2019-11-25 DIAGNOSIS — K5909 Other constipation: Secondary | ICD-10-CM | POA: Diagnosis not present

## 2019-11-25 DIAGNOSIS — K449 Diaphragmatic hernia without obstruction or gangrene: Secondary | ICD-10-CM | POA: Diagnosis not present

## 2019-11-25 DIAGNOSIS — K219 Gastro-esophageal reflux disease without esophagitis: Secondary | ICD-10-CM

## 2019-11-25 DIAGNOSIS — M069 Rheumatoid arthritis, unspecified: Secondary | ICD-10-CM | POA: Diagnosis not present

## 2019-11-25 DIAGNOSIS — R0602 Shortness of breath: Secondary | ICD-10-CM

## 2019-11-25 MED ORDER — FAMOTIDINE 40 MG PO TABS: 40.0000 mg | ORAL_TABLET | Freq: Every day | ORAL | 1 refills | Status: DC

## 2019-11-25 NOTE — Assessment & Plan Note (Signed)
Supplement and monitor 

## 2019-11-25 NOTE — Assessment & Plan Note (Signed)
Despite being on PPI has had 3 very painful episodes of reflux recently will add Famotidine 40 mg qhs and if symptoms persist may need referral back to gastroenterology for further evaluation

## 2019-11-25 NOTE — Patient Instructions (Signed)
Encouraged increased hydration and fiber in diet. Daily probiotics. If bowels not moving can use MOM 2 tbls po in 4 oz of warm prune juice by mouth every 2-3 days. If no results then repeat in 4 hours with  Dulcolax suppository pr, may repeat again in 4 more hours as needed. Seek care if symptoms worsen. Consider daily Miralax and/or Dulcolax if symptoms persist.  Try mixing Miralax and Benefiber together once to twice daily  Omron Blood Pressure cuff, upper arm, want BP 100-140/60-90 Pulse oximeter, want oxygen in 90s  Weekly vitals  Take Multivitamin with minerals, selenium Vitamin D 1000-2000 IU daily Probiotic with lactobacillus and bifidophilus Asprin EC 81 mg daily Fish or Krill oil daily Melatonin 2-5 mg at bedtime  [email or web address]

## 2019-11-25 NOTE — Assessment & Plan Note (Signed)
Continues to follow with neurology and works with PT, yoga and a balance class to try and keep her strength up still frustrated with her balance at times but is managing well most days

## 2019-11-25 NOTE — Assessment & Plan Note (Signed)
hgba1c acceptable, minimize simple carbs. Increase exercise as tolerated.  

## 2019-11-25 NOTE — Progress Notes (Signed)
Patient ID: Kristin [name], female   DOB: 06-25-1951, 69 y.o.   MRN: [id number] Virtual Visit via Video Note  I connected with Kristin [name] on 11/25/19 at 11:20 AM EDT by a video enabled telemedicine application and verified that I am speaking with the correct person using two identifiers.  Location: Patient: home Provider: office   I discussed the limitations of evaluation and management by telemedicine and the availability of in person appointments. The patient expressed understanding and agreed to proceed. Kem Boroughs, CMA was able to get the patient setup on a visit, video   Subjective:    Patient ID: Kristin [name], female    DOB: [birth year], 69 y.o.   MRN: [id number]  Chief Complaint  Patient presents with  . Follow-up    HPI Patient is in today for follow up on chronic medical concerns. She is doing well for the most part. She has had both of her COVID vaccines and tolerated them fairly well. No recent febrile illness or hospitalizations. She is continuing to struggle with balance and weakness issues but is doing yoga, riding a bike and taking balance classes. Her greatest concerns are the worsening reflux with 3 very painful episodes of acid refluxing into her chest. She also struggles with constipation still. Taking miralax daily caused bowels to be too loose so she backed off and if having constipation again but no bloody or tarry stool noted. Denies CP/palp/HA/congestion/fevers or GU c/o. Taking meds as prescribed. She does note some anxiety and sob at times and believes they are related.   Past Medical History:  Diagnosis Date  . Abnormal vaginal Pap smear   . Acute pharyngitis 02/25/2013  . Anemia   . Anxiety   . Arthritis   . 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 Chicken pox as a child  . Chronic UTI    sees dr Terance Hart  . Constipation 12/07/2015  . Depression with anxiety 11/02/2009   Qualifier: Diagnosis of  By: [name], LPN, [name]   .  Dermatitis 07/17/2012  . Esophageal stricture 1994  . Fibroids   . Foot pain, bilateral 06/19/2012  . GERD (gastroesophageal reflux disease)   . GERD (gastroesophageal reflux disease)   . Hiatal hernia   . Hyperglycemia 08/19/2013  . 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 Hyperlipidemia   . Hypertension   . Infertility, female   . Low back pain 10/17/2007   Qualifier: Diagnosis of  By: [name], [name]   . Measles as a child  . Obesity   . Osteoarthritis   . Parkinson disease ([place])   . Plantar fasciitis of left foot 06/19/2012  . Preventative health care 12/19/2015  . Rheumatoid arthritis ([place]) 01/08/2018  . Rosacea 10/05/2014  . Swallowing difficulty   . Urinary frequency 02/25/2013  . Visual floaters 05/11/2014    Past Surgical History:  Procedure Laterality Date  . ABDOMINAL HYSTERECTOMY  2006   total  . esophageal     stretching  . laporoscopy    . LEFT HEART CATH AND CORONARY ANGIOGRAPHY N/A 06/02/2019   Procedure: LEFT HEART CATH AND CORONARY ANGIOGRAPHY;  Surgeon: Jettie Booze, MD;  Location: [facility];  Service: Cardiovascular;  Laterality: N/A;  . TONSILLECTOMY    . TOTAL HIP ARTHROPLASTY Right 2010  . wisdom teeth extracted      Family History  Problem Relation Age of Onset  . Cancer Mother  breast  . Other Mother        arrythmia  . Mental illness Mother        bipolar  . Hyperlipidemia Mother   . Thyroid disease Mother   . Depression Mother   . Bipolar disorder Mother   . Heart disease Father   . Arthritis Father        rheumatoid  . Hypertension Father   . Hyperlipidemia Father   . Depression Sister   . Mental illness Sister        bipolar  . Parkinson's disease Sister   . Arthritis Sister   . Arthritis Sister   . Arthritis Sister   . Colon cancer Neg Hx   . Esophageal cancer Neg Hx   . Rectal cancer Neg Hx   . Stomach cancer Neg Hx     Social History   Socioeconomic History  . Marital status: Married    Spouse name:  Not on file  . Number of children: 1  . Years of education: Not on file  . Highest education level: Master's degree (e.g., MA, MS, MEng, MEd, MSW, MBA)  Occupational History  . Occupation: Retired Teacher, music  Tobacco Use  . Smoking status: Never Smoker  . Smokeless tobacco: Never Used  Substance and Sexual Activity  . Alcohol use: Yes    Alcohol/week: 4.0 standard drinks    Types: 4 Glasses of wine per week    Comment: occasionally  . Drug use: No  . Sexual activity: Not Currently    Partners: Male  Other Topics Concern  . Not on file  Social History Narrative   Lives with husband, no major dietary restrictions, retired from teaching      Husband dx with cancer MDS June 2017.   Currently in remission. (11/03/16 pc)      Pt has one biological child the other 3 are adopted      Social Determinants of Health   Financial Resource Strain: Low Risk   . Difficulty of Paying Living Expenses: Not hard at all  Food Insecurity:   . Worried About Charity fundraiser in the Last Year:   . Arboriculturist in the Last Year:   Transportation Needs: No Transportation Needs  . Lack of Transportation (Medical): No  . Lack of Transportation (Non-Medical): No  Physical Activity:   . Days of Exercise per Week:   . Minutes of Exercise per Session:   Stress:   . Feeling of Stress :   Social Connections:   . Frequency of Communication with Friends and Family:   . Frequency of Social Gatherings with Friends and Family:   . Attends Religious Services:   . Active Member of Clubs or Organizations:   . Attends Archivist Meetings:   Marland Kitchen Marital Status:   Intimate Partner Violence:   . Fear of Current or Ex-Partner:   . Emotionally Abused:   Marland Kitchen Physically Abused:   . Sexually Abused:     Outpatient Medications Prior to Visit  Medication Sig Dispense Refill  . albuterol (VENTOLIN HFA) 108 (90 Base) MCG/ACT inhaler Inhale 2 puffs into the lungs every 6 (six) hours as needed for  wheezing or shortness of breath. 18 g 2  . ALPRAZolam (XANAX) 0.25 MG tablet TAKE 1/2 TO 1 TABLET TWICE A DAY AS NEEDED FOR ANXIETY (Patient taking differently: Take 0.125-0.25 mg by mouth 2 (two) times daily as needed for anxiety. TAKE 1/2 TO 1 TABLET TWICE A DAY  AS NEEDED FOR ANXIETY) 30 tablet 1  . aspirin EC 81 MG tablet Take 1 tablet (81 mg total) by mouth daily. 90 tablet 1  . B-D TB SYRINGE 1CC/27GX1/2" 27G X 1/2" 1 ML MISC     . carbidopa-levodopa (SINEMET CR) 50-200 MG tablet TAKE 1 TABLET BY MOUTH EVERYDAY AT BEDTIME 90 tablet 1  . carbidopa-levodopa (SINEMET IR) 25-100 MG tablet TAKE 2 TABLETS EVERY MORNING, 2 TABLETS IN THE AFTERNOON, AND 1 TABLET IN THE EVENING 450 tablet 1  . Carboxymethylcellul-Glycerin (REFRESH OPTIVE OP) Place 1 drop into both eyes 3 (three) times daily.    . carvedilol (COREG) 3.125 MG tablet TAKE 1 TABLET BY MOUTH TWICE A DAY WITH A MEAL 180 tablet 1  . DULoxetine (CYMBALTA) 30 MG capsule TAKE 1 CAPSULE BY MOUTH EVERY DAY 90 capsule 1  . escitalopram (LEXAPRO) 20 MG tablet TAKE 1 TABLET BY MOUTH EVERY DAY (Patient taking differently: Take 20 mg by mouth daily. ) 90 tablet 2  . folic acid (FOLVITE) 1 MG tablet Take 1 mg by mouth 3 (three) times daily.     . hydroxychloroquine (PLAQUENIL) 200 MG tablet Take 2 tablets (400 mg total) by mouth daily.    Marland Kitchen losartan (COZAAR) 50 MG tablet Take 1 tablet (50 mg total) by mouth daily. 90 tablet 3  . Methotrexate Sodium (METHOTREXATE, PF,) 50 MG/2ML injection Inject 20 mg into the muscle once a week.     Marland Kitchen omeprazole (PRILOSEC) 20 MG capsule TAKE 1 CAPSULE BY MOUTH EVERY DAY 90 capsule 1  . pramipexole (MIRAPEX) 0.5 MG tablet TAKE 1 TABLET BY MOUTH 3 TIMES A DAY 270 tablet 1  . rosuvastatin (CRESTOR) 10 MG tablet TAKE 1 TABLET BY MOUTH ON MONDAYS, WEDNESDAYS AND FRIDAYS 54 tablet 1  . furosemide (LASIX) 20 MG tablet Take 1 tablet (20 mg total) by mouth daily. On Tuesdays and Satudays only 72 tablet 1   No  facility-administered medications prior to visit.    Allergies  Allergen Reactions  . Bactrim [Sulfamethoxazole-Trimethoprim] Other (See Comments)    Oral ulcers and rash  . Penicillins Hives    Did it involve swelling of the face/tongue/throat, SOB, or low BP? No Did it involve sudden or severe rash/hives, skin peeling, or any reaction on the inside of your mouth or nose? No Did you need to seek medical attention at a hospital or doctor's office? No When did it last happen?30+ years ago If all above answers are "NO", may proceed with cephalosporin use.     Review of Systems  Constitutional: Positive for malaise/fatigue. Negative for fever.  HENT: Negative for congestion.   Eyes: Negative for blurred vision.  Respiratory: Positive for shortness of breath.   Cardiovascular: Negative for chest pain, palpitations and leg swelling.  Gastrointestinal: Positive for constipation and heartburn. Negative for abdominal pain, blood in stool and nausea.  Genitourinary: Negative for dysuria and frequency.  Musculoskeletal: Negative for falls.  Skin: Negative for rash.  Neurological: Positive for weakness. Negative for dizziness, loss of consciousness and headaches.  Endo/Heme/Allergies: Negative for environmental allergies.  Psychiatric/Behavioral: Negative for depression. The patient is nervous/anxious.        Objective:    Physical Exam Constitutional:      Appearance: Normal appearance. She is not ill-appearing.  HENT:     Head: Normocephalic and atraumatic.     Right Ear: External ear normal.     Left Ear: External ear normal.  Eyes:     General:  Right eye: No discharge.        Left eye: No discharge.  Pulmonary:     Effort: Pulmonary effort is normal.  Neurological:     Mental Status: She is alert and oriented to person, place, and time.  Psychiatric:        Behavior: Behavior normal.     There were no vitals taken for this visit. Wt Readings from Last 3  Encounters:  06/12/19 245 lb (111.1 kg)  06/02/19 245 lb (111.1 kg)  05/22/19 245 lb (111.1 kg)    Diabetic Foot Exam - Simple   No data filed     Lab Results  Component Value Date   WBC 6.1 05/29/2019   HGB 11.4 05/29/2019   HCT 34.7 05/29/2019   PLT 259 05/29/2019   GLUCOSE 70 05/29/2019   CHOL 163 11/05/2018   TRIG 87.0 11/05/2018   HDL 64.80 11/05/2018   LDLDIRECT 161.5 10/07/2008   LDLCALC 81 11/05/2018   ALT 5 11/05/2018   AST 18 11/05/2018   NA 144 05/29/2019   K 4.1 05/29/2019   CL 105 05/29/2019   CREATININE 0.81 05/29/2019   BUN 14 05/29/2019   CO2 24 05/29/2019   TSH 1.38 11/05/2018   INR 2.5 (H) 01/19/2009   HGBA1C 6.1 05/10/2018    Lab Results  Component Value Date   TSH 1.38 11/05/2018   Lab Results  Component Value Date   WBC 6.1 05/29/2019   HGB 11.4 05/29/2019   HCT 34.7 05/29/2019   MCV 84 05/29/2019   PLT 259 05/29/2019   Lab Results  Component Value Date   NA 144 05/29/2019   K 4.1 05/29/2019   CO2 24 05/29/2019   GLUCOSE 70 05/29/2019   BUN 14 05/29/2019   CREATININE 0.81 05/29/2019   BILITOT 0.5 11/05/2018   ALKPHOS 91 11/05/2018   AST 18 11/05/2018   ALT 5 11/05/2018   PROT 6.4 11/05/2018   ALBUMIN 4.1 11/05/2018   CALCIUM 9.5 05/29/2019   GFR 90.51 11/05/2018   Lab Results  Component Value Date   CHOL 163 11/05/2018   Lab Results  Component Value Date   HDL 64.80 11/05/2018   Lab Results  Component Value Date   LDLCALC 81 11/05/2018   Lab Results  Component Value Date   TRIG 87.0 11/05/2018   Lab Results  Component Value Date   CHOLHDL 3 11/05/2018   Lab Results  Component Value Date   HGBA1C 6.1 05/10/2018       Assessment & Plan:   Problem List Items Addressed This Visit    Hiatal hernia with GERD    Despite being on PPI has had 3 very painful episodes of reflux recently will add Famotidine 40 mg qhs and if symptoms persist may need referral back to gastroenterology for further evaluation       Relevant Medications   famotidine (PEPCID) 40 MG tablet   Parkinson's disease ([place])    Continues to follow with neurology and works with PT, yoga and a balance class to try and keep her strength up still frustrated with her balance at times but is managing well most days      Constipation    Encouraged increased hydration and fiber in diet. Daily probiotics. If bowels not moving can use MOM 2 tbls po in 4 oz of warm prune juice by mouth every 2-3 days. If no results then repeat in 4 hours with  Dulcolax suppository pr, may repeat again in 4 more  hours as needed. Seek care if symptoms worsen. Consider daily Miralax and/or Dulcolax if symptoms persist. Continues to bother her. When she used full doses of miralax and benefiber her bowels got too loose. She is encouraged to try 1/2 dose of miralax daily and benefiber twice a day or miralax qod      Shortness of breath on exertion    Still notes this at times but endorses that it is worse when she is with other people and she believes there is an anxiety component that occurs. She will report if it worsens.       Vitamin D deficiency    Supplement and monitor      Prediabetes    hgba1c acceptable, minimize simple carbs. Increase exercise as tolerated.       Rheumatoid arthritis ([place])    She is working with rheumatology and her pain is manageable so they are holding off on biologic meds for now         I am having Kristin [name] start on famotidine. I am also having her maintain her folic acid, methotrexate (PF), B-D TB SYRINGE 1CC/27GX1/2", ALPRAZolam, escitalopram, losartan, hydroxychloroquine, albuterol, Carboxymethylcellul-Glycerin (REFRESH OPTIVE OP), furosemide, aspirin EC, carvedilol, pramipexole, omeprazole, rosuvastatin, DULoxetine, carbidopa-levodopa, and carbidopa-levodopa.  Meds ordered this encounter  Medications  . famotidine (PEPCID) 40 MG tablet    Sig: Take 1 tablet (40 mg total) by mouth at bedtime.    Dispense:  90  tablet    Refill:  1     I discussed the assessment and treatment plan with the patient. The patient was provided an opportunity to ask questions and all were answered. The patient agreed with the plan and demonstrated an understanding of the instructions.   The patient was advised to call back or seek an in-person evaluation if the symptoms worsen or if the condition fails to improve as anticipated.  I provided 25 minutes of non-face-to-face time during this encounter.   [name], MD

## 2019-11-25 NOTE — Assessment & Plan Note (Signed)
Encouraged increased hydration and fiber in diet. Daily probiotics. If bowels not moving can use MOM 2 tbls po in 4 oz of warm prune juice by mouth every 2-3 days. If no results then repeat in 4 hours with  Dulcolax suppository pr, may repeat again in 4 more hours as needed. Seek care if symptoms worsen. Consider daily Miralax and/or Dulcolax if symptoms persist. Continues to bother her. When she used full doses of miralax and benefiber her bowels got too loose. She is encouraged to try 1/2 dose of miralax daily and benefiber twice a day or miralax qod

## 2019-11-25 NOTE — Assessment & Plan Note (Signed)
Still notes this at times but endorses that it is worse when she is with other people and she believes there is an anxiety component that occurs. She will report if it worsens.

## 2019-11-25 NOTE — Assessment & Plan Note (Signed)
She is working with rheumatology and her pain is manageable so they are holding off on biologic meds for now

## 2019-11-26 ENCOUNTER — Ambulatory Visit: Payer: Medicare Other

## 2019-11-26 ENCOUNTER — Other Ambulatory Visit: Payer: Medicare Other

## 2019-12-05 ENCOUNTER — Other Ambulatory Visit: Payer: Self-pay

## 2019-12-05 ENCOUNTER — Ambulatory Visit (INDEPENDENT_AMBULATORY_CARE_PROVIDER_SITE_OTHER): Payer: Medicare Other | Admitting: Cardiology

## 2019-12-05 ENCOUNTER — Encounter: Payer: Self-pay | Admitting: Cardiology

## 2019-12-05 VITALS — BP 118/72 | HR 76 | Ht 64.0 in | Wt 247.0 lb

## 2019-12-05 DIAGNOSIS — I5189 Other ill-defined heart diseases: Secondary | ICD-10-CM

## 2019-12-05 DIAGNOSIS — R0602 Shortness of breath: Secondary | ICD-10-CM

## 2019-12-05 DIAGNOSIS — I2510 Atherosclerotic heart disease of native coronary artery without angina pectoris: Secondary | ICD-10-CM

## 2019-12-05 DIAGNOSIS — I10 Essential (primary) hypertension: Secondary | ICD-10-CM

## 2019-12-05 NOTE — Progress Notes (Signed)
Cardiology Office Note:    Date:  12/05/2019   ID:  Kristin [name], DOB 07-15-1951, MRN [id number]  PCP:  Mosie Lukes, MD  Cardiologist:  Berniece Salines, DO  Electrophysiologist:  None   Referring MD: Mosie Lukes, MD   Follow-up visit History of Present Illness:    Kristin [name] is a 69 y.o. female with a hx of minimal coronary artery disease by cardiac catheterization, hypertension, hyperlipidemia, obesity, rheumatoid arthritis and Parkinson's presents today for follow-up visit.  She tells me that she has been doing well but reported incidence of burning chest pain which were associated with eating acidic foods.  She has since been started on Pepcid by her PCP and has not had any recurrence of this pain.  She is still expressing her baseline shortness of breath.  No other complaints at this time.   Past Medical History:  Diagnosis Date  . Abnormal vaginal Pap smear   . Acute pharyngitis 02/25/2013  . Anemia   . Anxiety   . Arthritis   . 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 Chicken pox as a child  . Chronic UTI    sees dr Terance Hart  . Constipation 12/07/2015  . Depression with anxiety 11/02/2009   Qualifier: Diagnosis of  By: [name], LPN, [name]   . Dermatitis 07/17/2012  . Esophageal stricture 1994  . Fibroids   . Foot pain, bilateral 06/19/2012  . GERD (gastroesophageal reflux disease)   . GERD (gastroesophageal reflux disease)   . Hiatal hernia   . Hyperglycemia 08/19/2013  . 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 Hyperlipidemia   . Hypertension   . Infertility, female   . Low back pain 10/17/2007   Qualifier: Diagnosis of  By: [name], [name]   . Measles as a child  . Obesity   . Osteoarthritis   . Parkinson disease ([place])   . Plantar fasciitis of left foot 06/19/2012  . Preventative health care 12/19/2015  . Rheumatoid arthritis ([place]) 01/08/2018  . Rosacea 10/05/2014  . Swallowing difficulty   . Urinary frequency 02/25/2013  . Visual  floaters 05/11/2014    Past Surgical History:  Procedure Laterality Date  . ABDOMINAL HYSTERECTOMY  2006   total  . esophageal     stretching  . laporoscopy    . LEFT HEART CATH AND CORONARY ANGIOGRAPHY N/A 06/02/2019   Procedure: LEFT HEART CATH AND CORONARY ANGIOGRAPHY;  Surgeon: Jettie Booze, MD;  Location: [facility];  Service: Cardiovascular;  Laterality: N/A;  . TONSILLECTOMY    . TOTAL HIP ARTHROPLASTY Right 2010  . wisdom teeth extracted      Current Medications: Current Meds  Medication Sig  . albuterol (VENTOLIN HFA) 108 (90 Base) MCG/ACT inhaler Inhale 2 puffs into the lungs every 6 (six) hours as needed for wheezing or shortness of breath.  . ALPRAZolam (XANAX) 0.25 MG tablet TAKE 1/2 TO 1 TABLET TWICE A DAY AS NEEDED FOR ANXIETY  . aspirin EC 81 MG tablet Take 1 tablet (81 mg total) by mouth daily.  . B-D TB SYRINGE 1CC/27GX1/2" 27G X 1/2" 1 ML MISC   . carbidopa-levodopa (SINEMET CR) 50-200 MG tablet TAKE 1 TABLET BY MOUTH EVERYDAY AT BEDTIME  . carbidopa-levodopa (SINEMET IR) 25-100 MG tablet TAKE 2 TABLETS EVERY MORNING, 2 TABLETS IN THE AFTERNOON, AND 1 TABLET IN THE EVENING  . Carboxymethylcellul-Glycerin (REFRESH OPTIVE OP) Place 1 drop into both eyes 3 (three) times daily.  Marland Kitchen  carvedilol (COREG) 3.125 MG tablet TAKE 1 TABLET BY MOUTH TWICE A DAY WITH A MEAL  . DULoxetine (CYMBALTA) 30 MG capsule TAKE 1 CAPSULE BY MOUTH EVERY DAY  . escitalopram (LEXAPRO) 20 MG tablet TAKE 1 TABLET BY MOUTH EVERY DAY  . famotidine (PEPCID) 40 MG tablet Take 1 tablet (40 mg total) by mouth at bedtime.  . folic acid (FOLVITE) 1 MG tablet Take 1 mg by mouth 3 (three) times daily.   . hydroxychloroquine (PLAQUENIL) 200 MG tablet Take 2 tablets (400 mg total) by mouth daily.  Marland Kitchen losartan (COZAAR) 50 MG tablet Take 1 tablet (50 mg total) by mouth daily.  . melatonin 5 MG TABS Take 5 mg by mouth at bedtime.  . Methotrexate Sodium (METHOTREXATE, PF,) 50 MG/2ML injection Inject  20 mg into the muscle once a week.   Marland Kitchen omeprazole (PRILOSEC) 20 MG capsule TAKE 1 CAPSULE BY MOUTH EVERY DAY  . pramipexole (MIRAPEX) 0.5 MG tablet TAKE 1 TABLET BY MOUTH 3 TIMES A DAY  . rosuvastatin (CRESTOR) 10 MG tablet TAKE 1 TABLET BY MOUTH ON MONDAYS, WEDNESDAYS AND FRIDAYS     Allergies:   Bactrim [sulfamethoxazole-trimethoprim] and Penicillins   Social History   Socioeconomic History  . Marital status: Married    Spouse name: Not on file  . Number of children: 1  . Years of education: Not on file  . Highest education level: Master's degree (e.g., MA, MS, MEng, MEd, MSW, MBA)  Occupational History  . Occupation: Retired Teacher, music  Tobacco Use  . Smoking status: Never Smoker  . Smokeless tobacco: Never Used  Substance and Sexual Activity  . Alcohol use: Yes    Alcohol/week: 4.0 standard drinks    Types: 4 Glasses of wine per week    Comment: occasionally  . Drug use: No  . Sexual activity: Not Currently    Partners: Male  Other Topics Concern  . Not on file  Social History Narrative   Lives with husband, no major dietary restrictions, retired from teaching      Husband dx with cancer MDS June 2017.   Currently in remission. (11/03/16 pc)      Pt has one biological child the other 3 are adopted      Social Determinants of Health   Financial Resource Strain: Low Risk   . Difficulty of Paying Living Expenses: Not hard at all  Food Insecurity:   . Worried About Charity fundraiser in the Last Year:   . Arboriculturist in the Last Year:   Transportation Needs: No Transportation Needs  . Lack of Transportation (Medical): No  . Lack of Transportation (Non-Medical): No  Physical Activity:   . Days of Exercise per Week:   . Minutes of Exercise per Session:   Stress:   . Feeling of Stress :   Social Connections:   . Frequency of Communication with Friends and Family:   . Frequency of Social Gatherings with Friends and Family:   . Attends Religious Services:    . Active Member of Clubs or Organizations:   . Attends Archivist Meetings:   Marland Kitchen Marital Status:      Family History: The patient's family history includes Arthritis in her father, sister, sister, and sister; Bipolar disorder in her mother; Cancer in her mother; Depression in her mother and sister; Heart disease in her father; Hyperlipidemia in her father and mother; Hypertension in her father; Mental illness in her mother and sister; Other in  her mother; Parkinson's disease in her sister; Thyroid disease in her mother. There is no history of Colon cancer, Esophageal cancer, Rectal cancer, or Stomach cancer.  ROS:   Review of Systems  Constitution: Negative for decreased appetite, fever and weight gain.  HENT: Negative for congestion, ear discharge, hoarse voice and sore throat.   Eyes: Negative for discharge, redness, vision loss in right eye and visual halos.  Cardiovascular: Negative for chest pain, dyspnea on exertion, leg swelling, orthopnea and palpitations.  Respiratory: Negative for cough, hemoptysis, shortness of breath and snoring.   Endocrine: Negative for heat intolerance and polyphagia.  Hematologic/Lymphatic: Negative for bleeding problem. Does not bruise/bleed easily.  Skin: Negative for flushing, nail changes, rash and suspicious lesions.  Musculoskeletal: Negative for arthritis, joint pain, muscle cramps, myalgias, neck pain and stiffness.  Gastrointestinal: Negative for abdominal pain, bowel incontinence, diarrhea and excessive appetite.  Genitourinary: Negative for decreased libido, genital sores and incomplete emptying.  Neurological: Negative for brief paralysis, focal weakness, headaches and loss of balance.  Psychiatric/Behavioral: Negative for altered mental status, depression and suicidal ideas.  Allergic/Immunologic: Negative for HIV exposure and persistent infections.    EKGs/Labs/Other Studies Reviewed:    The following studies were reviewed  today:   EKG:  The ekg ordered today demonstrates sinus rhythm, heart rate 76 bpm, with left axis deviation and concerns for old anterior infarction similar to EKG done on May 15, 2019.  LHC 06/02/2019  Mid RCA lesion is 10% stenosed. Minimal irregularities.  The left ventricular systolic function is normal.  LV end diastolic pressure is normal. LVEDP 13 mm Hg.  The left ventricular ejection fraction is 55-65% by visual estimate.  There is no aortic valve stenosis.  If heart cath needed in the future, woudl not use right radial approach due to tortuosity.  RCA with Anterior takeoff requiring AL2 catheter. No significant CAD.  Continue aggressive preventive therapy.   Recent Labs: 05/29/2019: BUN 14; Creatinine, Ser 0.81; Hemoglobin 11.4; Platelets 259; Potassium 4.1; Sodium 144  Recent Lipid Panel    Component Value Date/Time   CHOL 163 11/05/2018 0953   CHOL 180 11/27/2016 1123   TRIG 87.0 11/05/2018 0953   HDL 64.80 11/05/2018 0953   HDL 57 11/27/2016 1123   CHOLHDL 3 11/05/2018 0953   VLDL 17.4 11/05/2018 0953   LDLCALC 81 11/05/2018 0953   LDLCALC 88 11/27/2016 1123   LDLDIRECT 161.5 10/07/2008 0944    Physical Exam:    VS:  BP 118/72   Pulse 76   Ht 5\' 4"  (1.626 m)   Wt 247 lb (112 kg)   SpO2 98%   BMI 42.40 kg/m     Wt Readings from Last 3 Encounters:  12/05/19 247 lb (112 kg)  06/12/19 245 lb (111.1 kg)  06/02/19 245 lb (111.1 kg)     GEN: Well nourished, well developed in no acute distress HEENT: Normal NECK: No JVD; No carotid bruits LYMPHATICS: No lymphadenopathy CARDIAC: S1S2 noted,RRR, no murmurs, rubs, gallops RESPIRATORY:  Clear to auscultation without rales, wheezing or rhonchi  ABDOMEN: Soft, non-tender, non-distended, +bowel sounds, no guarding. EXTREMITIES: No edema, No cyanosis, no clubbing MUSCULOSKELETAL:  No deformity  SKIN: Warm and dry NEUROLOGIC:  Alert and oriented x 3, non-focal PSYCHIATRIC:  Normal affect, good  insight  ASSESSMENT:    1. Coronary artery disease involving native coronary artery of native heart without angina pectoris   2. Essential hypertension   3. Diastolic dysfunction   4. Shortness of breath on exertion  5. Morbid obesity ([place])    PLAN:     1.  Minimal CAD-stable continue patient on aspirin 1 mg and Crestor 10 mg daily.  2.  Hypertension blood pressures acceptable in the office today.  We will continue her on her current medication regimen.  3.  Shortness of breath-I think this is multifactorial, she is continuing to attempt exercise in weight loss.  I also asked the patient to continue taking her Lasix in the setting of her diastolic dysfunction as she has not been taking it as prescribed.  4.  Obesity-the patient understands the need to lose weight with diet and exercise. We have discussed specific strategies for this.  The patient is in agreement with the above plan. The patient left the office in stable condition.  The patient will follow up in 1 year or sooner if needed.   Medication Adjustments/Labs and Tests Ordered: Current medicines are reviewed at length with the patient today.  Concerns regarding medicines are outlined above.  Orders Placed This Encounter  Procedures  . Basic metabolic panel  . Magnesium  . EKG 12-Lead   No orders of the defined types were placed in this encounter.   Patient Instructions  Medication Instructions:  No medication changes *If you need a refill on your cardiac medications before your next appointment, please call your pharmacy*   Lab Work: Your physician recommends that you have a BMET and Magnesium done today in the office.    If you have labs (blood work) drawn today and your tests are completely normal, you will receive your results only by: Marland Kitchen MyChart Message (if you have MyChart) OR . A paper copy in the mail If you have any lab test that is abnormal or we need to change your treatment, we will call you to  review the results.   Testing/Procedures: None ordered   Follow-Up: At [facility], you and your health needs are our priority.  As part of our continuing mission to provide you with exceptional heart care, we have created designated Provider Care Teams.  These Care Teams include your primary Cardiologist (physician) and Advanced Practice Providers (APPs -  Physician Assistants and Nurse Practitioners) who all work together to provide you with the care you need, when you need it.  We recommend signing up for the patient portal called "MyChart".  Sign up information is provided on this After Visit Summary.  MyChart is used to connect with patients for Virtual Visits (Telemedicine).  Patients are able to view lab/test results, encounter notes, upcoming appointments, etc.  Non-urgent messages can be sent to your provider as well.   To learn more about what you can do with MyChart, go to [email or web address].    Your next appointment:   1 year(s)  The format for your next appointment:   In Person  Provider:   Berniece Salines, DO   Other Instructions NA     Adopting a Healthy Lifestyle.  Know what a healthy weight is for you (roughly BMI <25) and aim to maintain this   Aim for 7+ servings of fruits and vegetables daily   65-80+ fluid ounces of water or unsweet tea for healthy kidneys   Limit to max 1 drink of alcohol per day; avoid smoking/tobacco   Limit animal fats in diet for cholesterol and heart health - choose grass fed whenever available   Avoid highly processed foods, and foods high in saturated/trans fats   Aim for low stress - take time to unwind  and care for your mental health   Aim for 150 min of moderate intensity exercise weekly for heart health, and weights twice weekly for bone health   Aim for 7-9 hours of sleep daily   When it comes to diets, agreement about the perfect plan isnt easy to find, even among the experts. Experts at the [employer] [place] developed an idea known as the Healthy Eating Plate. Just imagine a plate divided into logical, healthy portions.   The emphasis is on diet quality:   Load up on vegetables and fruits - one-half of your plate: Aim for color and variety, and remember that potatoes dont count.   Go for whole grains - one-quarter of your plate: Whole wheat, barley, wheat berries, quinoa, oats, brown rice, and foods made with them. If you want pasta, go with whole wheat pasta.   Protein power - one-quarter of your plate: Fish, chicken, beans, and nuts are all healthy, versatile protein sources. Limit red meat.   The diet, however, does go beyond the plate, offering a few other suggestions.   Use healthy plant oils, such as olive, canola, soy, corn, sunflower and peanut. Check the labels, and avoid partially hydrogenated oil, which have unhealthy trans fats.   If youre thirsty, drink water. Coffee and tea are good in moderation, but skip sugary drinks and limit milk and dairy products to one or two daily servings.   The type of carbohydrate in the diet is more important than the amount. Some sources of carbohydrates, such as vegetables, fruits, whole grains, and beans-are healthier than others.   Finally, stay active  Signed, Berniece Salines, DO  12/05/2019 9:29 AM    [facility]

## 2019-12-05 NOTE — Patient Instructions (Signed)
Medication Instructions:  No medication changes *If you need a refill on your cardiac medications before your next appointment, please call your pharmacy*   Lab Work: Your physician recommends that you have a BMET and Magnesium done today in the office.    If you have labs (blood work) drawn today and your tests are completely normal, you will receive your results only by: Marland Kitchen MyChart Message (if you have MyChart) OR . A paper copy in the mail If you have any lab test that is abnormal or we need to change your treatment, we will call you to review the results.   Testing/Procedures: None ordered   Follow-Up: At [facility], you and your health needs are our priority.  As part of our continuing mission to provide you with exceptional heart care, we have created designated Provider Care Teams.  These Care Teams include your primary Cardiologist (physician) and Advanced Practice Providers (APPs -  Physician Assistants and Nurse Practitioners) who all work together to provide you with the care you need, when you need it.  We recommend signing up for the patient portal called "MyChart".  Sign up information is provided on this After Visit Summary.  MyChart is used to connect with patients for Virtual Visits (Telemedicine).  Patients are able to view lab/test results, encounter notes, upcoming appointments, etc.  Non-urgent messages can be sent to your provider as well.   To learn more about what you can do with MyChart, go to [email or web address].    Your next appointment:   1 year(s)  The format for your next appointment:   In Person  Provider:   Berniece Salines, DO   Other Instructions NA

## 2019-12-06 LAB — BASIC METABOLIC PANEL
BUN/Creatinine Ratio: 18 (ref 12–28)
BUN: 13 mg/dL (ref 8–27)
CO2: 23 mmol/L (ref 20–29)
Calcium: 9.2 mg/dL (ref 8.7–10.3)
Chloride: 109 mmol/L — ABNORMAL HIGH (ref 96–106)
Creatinine, Ser: 0.71 mg/dL (ref 0.57–1.00)
GFR calc Af Amer: 100 mL/min/{1.73_m2} (ref >59)
GFR calc non Af Amer: 87 mL/min/{1.73_m2} (ref >59)
Glucose: 105 mg/dL — ABNORMAL HIGH (ref 65–99)
Potassium: 4.4 mmol/L (ref 3.5–5.2)
Sodium: 144 mmol/L (ref 134–144)

## 2019-12-06 LAB — MAGNESIUM: Magnesium: 2.1 mg/dL (ref 1.6–2.3)

## 2019-12-08 ENCOUNTER — Telehealth: Payer: Self-pay

## 2019-12-08 NOTE — Telephone Encounter (Signed)
-----   Message from Berniece Salines, DO sent at 12/06/2019  2:35 PM EDT ----- Slightly high blood glucose otherwise normal labs.

## 2019-12-08 NOTE — Telephone Encounter (Signed)
Left message on patients voicemail to please return our call.   

## 2019-12-10 NOTE — Telephone Encounter (Signed)
Spoke with patient regarding results.  Patient verbalizes understanding and is agreeable to plan of care. Advised patient to call back with any issues or concerns.  

## 2019-12-11 ENCOUNTER — Ambulatory Visit: Payer: Medicare Other | Admitting: Physical Therapy

## 2019-12-11 ENCOUNTER — Ambulatory Visit: Payer: Medicare Other | Admitting: Occupational Therapy

## 2019-12-11 ENCOUNTER — Telehealth: Payer: Self-pay

## 2019-12-11 ENCOUNTER — Ambulatory Visit: Payer: Medicare Other

## 2019-12-11 NOTE — Telephone Encounter (Signed)
-----   Message from Berniece Salines, DO sent at 12/06/2019  2:35 PM EDT ----- Slightly high blood glucose otherwise normal labs.

## 2019-12-11 NOTE — Telephone Encounter (Signed)
Lpm with results 4/8

## 2019-12-24 ENCOUNTER — Ambulatory Visit
Admission: RE | Admit: 2019-12-24 | Discharge: 2019-12-24 | Disposition: A | Discharge status: Home / Self Care | Payer: Medicare Other | Source: Ambulatory Visit | Attending: Family Medicine | Admitting: Family Medicine

## 2019-12-24 ENCOUNTER — Other Ambulatory Visit: Payer: Self-pay | Admitting: Family Medicine

## 2019-12-24 ENCOUNTER — Other Ambulatory Visit: Payer: Self-pay

## 2019-12-24 DIAGNOSIS — Z1231 Encounter for screening mammogram for malignant neoplasm of breast: Secondary | ICD-10-CM

## 2019-12-24 DIAGNOSIS — Z780 Asymptomatic menopausal state: Secondary | ICD-10-CM

## 2019-12-25 ENCOUNTER — Other Ambulatory Visit: Payer: Self-pay | Admitting: Family Medicine

## 2019-12-25 DIAGNOSIS — R928 Other abnormal and inconclusive findings on diagnostic imaging of breast: Secondary | ICD-10-CM

## 2019-12-30 ENCOUNTER — Ambulatory Visit: Payer: Medicare Other

## 2019-12-30 ENCOUNTER — Ambulatory Visit: Payer: Medicare Other | Admitting: Occupational Therapy

## 2019-12-30 ENCOUNTER — Other Ambulatory Visit: Payer: Self-pay

## 2019-12-30 ENCOUNTER — Ambulatory Visit: Payer: Medicare Other | Attending: Neurology | Admitting: Physical Therapy

## 2019-12-30 ENCOUNTER — Other Ambulatory Visit: Payer: Self-pay | Admitting: Family Medicine

## 2019-12-30 DIAGNOSIS — R278 Other lack of coordination: Secondary | ICD-10-CM

## 2019-12-30 DIAGNOSIS — R29818 Other symptoms and signs involving the nervous system: Secondary | ICD-10-CM | POA: Insufficient documentation

## 2019-12-30 DIAGNOSIS — R471 Dysarthria and anarthria: Secondary | ICD-10-CM

## 2019-12-30 DIAGNOSIS — R1312 Dysphagia, oropharyngeal phase: Secondary | ICD-10-CM | POA: Insufficient documentation

## 2019-12-30 DIAGNOSIS — R2689 Other abnormalities of gait and mobility: Secondary | ICD-10-CM

## 2019-12-30 NOTE — Therapy (Signed)
[facility] [address] [place], [place], [address] Phone: [phone]   Fax:  [phone]  Patient Details  Name: Kristin [name] MRN: [id number] Date of Birth: Jun 13, 1951 Referring Provider:  Alonza Bogus, DO  Encounter Date: 12/30/2019   Speech Therapy Parkinson's Disease Screen   Decibel Level today: low 70s dB  (WNL=70-72 dB) with sound level meter 30cm away from pt's mouth, masked. Pt's conversational volume has remained essentially the same since last treatment course. Pt's speech volume is WNL at this time.  However, pt has experienced difficulty in swallowing warranting objective evaluation. She no longer eats "dry meat" and "I have to be careful when I eat."  Pt would benefit from a modified barium swallow eval - please order via EPIC or call [phone] to schedule.    [facility] ,[place], [place]  12/30/2019, 1:39 PM  [facility] [address] [place], [place], [address] Phone: [phone]   Fax:  [phone]

## 2019-12-30 NOTE — Therapy (Signed)
[facility] [address] [place], [place], [address] Phone: [phone]   Fax:  [phone]  Patient Details  Name: Kristin [name] MRN: [id number] Date of Birth: Aug 30, 1951 Referring Provider:  Mosie Lukes, MD  Encounter Date: 12/30/2019   Frazier Butt. 12/30/2019, 1:50 PM   Physical Therapy Parkinson's Disease Screen   Timed Up and Go test: 12.41 sec  10 meter walk test:  11.31 sec (2.9 ft/sec)  5 time sit to stand test: 11.37 sec   Patient does not require Physical Therapy services at this time.  Recommend Physical Therapy screen in 6 months.  Mady Haagensen, PT 12/30/19 2:04 PM Phone: [phone] Fax: [facility] [address] [place], [place], [address] Phone: [phone]   Fax:  [phone]

## 2019-12-30 NOTE — Therapy (Signed)
[facility] [address] [place], [place], [address] Phone: [phone]   Fax:  [phone]  Patient Details  Name: Kristin [name] MRN: [id number] Date of Birth: Feb 01, 1951 Referring Provider:  Mosie Lukes, MD  Encounter Date: 12/30/2019  Occupational Therapy Parkinson's Disease Screen  Hand dominance:  left   Fastening/unfastening 3 buttons in:  26.0sec  9-hole peg test:    RUE  28.18 sec        LUE  19.10 sec  Box & Blocks Test:   RUE  60 blocks        LUE  61 blocks  Change in ability to perform ADLs/IADLs:  No   Other Comments:  Pt reports incr difficulty with walking in past 1-2 months with hesitancy.  Pt does not require occupational therapy services at this time.  Recommended occupational therapy screen in   approx 6-9 months     [facility] 12/30/2019, 1:20 PM  [facility] [address] [place], [place], [address] Phone: [phone]   Fax:  [place], OTR/L [facility] [address] [place], [place]  [address] [phone] phone [phone] 12/30/19 1:20 PM

## 2019-12-31 ENCOUNTER — Encounter: Payer: Self-pay | Admitting: Family Medicine

## 2019-12-31 ENCOUNTER — Other Ambulatory Visit: Payer: Self-pay

## 2019-12-31 ENCOUNTER — Telehealth: Payer: Self-pay

## 2019-12-31 DIAGNOSIS — R1310 Dysphagia, unspecified: Secondary | ICD-10-CM

## 2019-12-31 DIAGNOSIS — Z780 Asymptomatic menopausal state: Secondary | ICD-10-CM

## 2019-12-31 NOTE — Telephone Encounter (Addendum)
-----   Message from [place], DO sent at 12/31/2019  8:01 AM EDT ST has asked [place] to order MBE.  Please order.  Dx:  dysphagia   ----- Message ----- From: Cori Razor Sent: 12/30/2019   5:17 PM EDT To: Eustace Quail Tat, DO  Recommending MBSS for pt. See note for details.  Speech loudness remains WNL.

## 2019-12-31 NOTE — Progress Notes (Signed)
ST has asked [place] to order MBE.  Please order.  Dx:  dysphagia

## 2019-12-31 NOTE — Telephone Encounter (Signed)
Per Dr Tat orders for Modified Barium Swallow test are being placed.   Left detailed message on patients voicemail with Dr Doristine Devoid recommendations. Advised patient to contact office with any questions or concerns.   Attempted to call home phone but no answer and unable to leave a message.   Message sent to [facility] to contact patient to schedule the Modified Barium Swallow test.

## 2020-01-02 ENCOUNTER — Ambulatory Visit
Admission: RE | Admit: 2020-01-02 | Discharge: 2020-01-02 | Disposition: A | Discharge status: Home / Self Care | Payer: Medicare Other | Source: Ambulatory Visit | Attending: Family Medicine | Admitting: Family Medicine

## 2020-01-02 ENCOUNTER — Other Ambulatory Visit: Payer: Self-pay | Admitting: Family Medicine

## 2020-01-02 ENCOUNTER — Other Ambulatory Visit: Payer: Self-pay

## 2020-01-02 DIAGNOSIS — R928 Other abnormal and inconclusive findings on diagnostic imaging of breast: Secondary | ICD-10-CM

## 2020-01-02 DIAGNOSIS — N6489 Other specified disorders of breast: Secondary | ICD-10-CM

## 2020-01-05 ENCOUNTER — Other Ambulatory Visit (HOSPITAL_COMMUNITY): Payer: Self-pay | Admitting: *Deleted

## 2020-01-05 DIAGNOSIS — R059 Cough, unspecified: Secondary | ICD-10-CM

## 2020-01-05 DIAGNOSIS — R1310 Dysphagia, unspecified: Secondary | ICD-10-CM

## 2020-01-05 DIAGNOSIS — R05 Cough: Secondary | ICD-10-CM

## 2020-01-20 ENCOUNTER — Ambulatory Visit (HOSPITAL_COMMUNITY)
Admission: RE | Admit: 2020-01-20 | Discharge: 2020-01-20 | Disposition: A | Discharge status: Home / Self Care | Payer: Medicare Other | Source: Ambulatory Visit | Attending: Neurology | Admitting: Neurology

## 2020-01-20 ENCOUNTER — Other Ambulatory Visit: Payer: Self-pay

## 2020-01-20 DIAGNOSIS — R05 Cough: Secondary | ICD-10-CM | POA: Diagnosis not present

## 2020-01-20 DIAGNOSIS — R1310 Dysphagia, unspecified: Secondary | ICD-10-CM

## 2020-01-20 DIAGNOSIS — G20 Parkinson's disease: Secondary | ICD-10-CM | POA: Diagnosis not present

## 2020-01-20 DIAGNOSIS — R059 Cough, unspecified: Secondary | ICD-10-CM

## 2020-02-03 ENCOUNTER — Other Ambulatory Visit: Payer: Self-pay | Admitting: Neurology

## 2020-02-03 NOTE — Telephone Encounter (Signed)
Rx(s) sent to pharmacy electronically.  

## 2020-02-13 ENCOUNTER — Encounter: Payer: Self-pay | Admitting: Internal Medicine

## 2020-02-13 ENCOUNTER — Other Ambulatory Visit: Payer: Self-pay

## 2020-02-13 ENCOUNTER — Ambulatory Visit (INDEPENDENT_AMBULATORY_CARE_PROVIDER_SITE_OTHER): Payer: Medicare Other | Admitting: Internal Medicine

## 2020-02-13 VITALS — BP 114/76 | HR 79 | Temp 96.3°F | Resp 18 | Ht 64.0 in | Wt 246.1 lb

## 2020-02-13 DIAGNOSIS — I2510 Atherosclerotic heart disease of native coronary artery without angina pectoris: Secondary | ICD-10-CM

## 2020-02-13 DIAGNOSIS — M545 Low back pain, unspecified: Secondary | ICD-10-CM

## 2020-02-13 LAB — POC URINALSYSI DIPSTICK (AUTOMATED)
Bilirubin, UA: NEGATIVE
Blood, UA: NEGATIVE
Glucose, UA: NEGATIVE
Ketones, UA: NEGATIVE
Leukocytes, UA: NEGATIVE
Nitrite, UA: NEGATIVE
Protein, UA: NEGATIVE
Spec Grav, UA: >=1.03 — AB (ref 1.010–1.025)
Urobilinogen, UA: 0.2 E.U./dL
pH, UA: 5.5 (ref 5.0–8.0)

## 2020-02-13 NOTE — Progress Notes (Signed)
Pre visit review using our clinic review tool, if applicable. No additional management support is needed unless otherwise documented below in the visit note. 

## 2020-02-13 NOTE — Patient Instructions (Signed)
Tylenol  500 mg OTC 2 tabs a day every 8 hours as needed for pain  Heating pad  Call if no gradually better  Call if severe pain or if you see blisters

## 2020-02-13 NOTE — Progress Notes (Signed)
Subjective:    Patient ID: Kristin [name], female    DOB: Feb 27, 1951, 68 y.o.   MRN: [id number]  DOS:  02/13/2020 Type of visit - description: Acute 2 days ago developed pain at the right mid back. No radiation Pain is on and off and not particularly worse with certain positions. It is throbbing, not sharp. Denies any injury, has been putting a heating pad.    Review of Systems No fever chills No nausea, vomiting, diarrhea.  No abdominal pain but she reports constipation lately. No dysuria or gross hematuria No rash   Past Medical History:  Diagnosis Date  . Abnormal vaginal Pap smear   . Acute pharyngitis 02/25/2013  . Anemia   . Anxiety   . Arthritis   . 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 Chicken pox as a child  . Chronic UTI    sees dr Terance Hart  . Constipation 12/07/2015  . Depression with anxiety 11/02/2009   Qualifier: Diagnosis of  By: [name], LPN, [name]   . Dermatitis 07/17/2012  . Esophageal stricture 1994  . Fibroids   . Foot pain, bilateral 06/19/2012  . GERD (gastroesophageal reflux disease)   . GERD (gastroesophageal reflux disease)   . Hiatal hernia   . Hyperglycemia 08/19/2013  . 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 Hyperlipidemia   . Hypertension   . Infertility, female   . Low back pain 10/17/2007   Qualifier: Diagnosis of  By: [name], [name]   . Measles as a child  . Obesity   . Osteoarthritis   . Parkinson disease ([place])   . Plantar fasciitis of left foot 06/19/2012  . Preventative health care 12/19/2015  . Rheumatoid arthritis ([place]) 01/08/2018  . Rosacea 10/05/2014  . Swallowing difficulty   . Urinary frequency 02/25/2013  . Visual floaters 05/11/2014    Past Surgical History:  Procedure Laterality Date  . ABDOMINAL HYSTERECTOMY  2006   total  . esophageal     stretching  . laporoscopy    . LEFT HEART CATH AND CORONARY ANGIOGRAPHY N/A 06/02/2019   Procedure: LEFT HEART CATH AND CORONARY ANGIOGRAPHY;  Surgeon:  Jettie Booze, MD;  Location: [facility];  Service: Cardiovascular;  Laterality: N/A;  . TONSILLECTOMY    . TOTAL HIP ARTHROPLASTY Right 2010  . wisdom teeth extracted      Allergies as of 02/13/2020      Reactions   Bactrim [sulfamethoxazole-trimethoprim] Other (See Comments)   Oral ulcers and rash   Penicillins Hives   Did it involve swelling of the face/tongue/throat, SOB, or low BP? No Did it involve sudden or severe rash/hives, skin peeling, or any reaction on the inside of your mouth or nose? No Did you need to seek medical attention at a hospital or doctor's office? No When did it last happen?30+ years ago If all above answers are "NO", may proceed with cephalosporin use.      Medication List       Accurate as of February 13, 2020 11:59 PM. If you have any questions, ask your nurse or doctor.        STOP taking these medications   methotrexate (PF) 50 MG/2ML injection Stopped by: Kathlene November, MD     TAKE these medications   albuterol 108 (90 Base) MCG/ACT inhaler Commonly known as: VENTOLIN HFA Inhale 2 puffs into the lungs every 6 (six) hours as needed for wheezing or shortness of breath.  ALPRAZolam 0.25 MG tablet Commonly known as: XANAX TAKE 1/2 TO 1 TABLET TWICE A DAY AS NEEDED FOR ANXIETY   aspirin EC 81 MG tablet Take 1 tablet (81 mg total) by mouth daily.   B-D TB SYRINGE 1CC/27GX1/2" 27G X 1/2" 1 ML Misc Generic drug: TUBERCULIN SYR 1CC/27GX1/2"   carbidopa-levodopa 25-100 MG tablet Commonly known as: SINEMET IR TAKE 2 TABLETS EVERY MORNING, 2 TABLETS IN THE AFTERNOON, AND 1 TABLET IN THE EVENING   carbidopa-levodopa 50-200 MG tablet Commonly known as: SINEMET CR TAKE 1 TABLET BY MOUTH EVERYDAY AT BEDTIME   carvedilol 3.125 MG tablet Commonly known as: COREG TAKE 1 TABLET BY MOUTH TWICE A DAY WITH MEALS   DULoxetine 30 MG capsule Commonly known as: CYMBALTA TAKE 1 CAPSULE BY MOUTH EVERY DAY   escitalopram 20 MG  tablet Commonly known as: LEXAPRO TAKE 1 TABLET BY MOUTH EVERY DAY   famotidine 40 MG tablet Commonly known as: PEPCID Take 1 tablet (40 mg total) by mouth at bedtime.   folic acid 1 MG tablet Commonly known as: FOLVITE Take 1 mg by mouth 3 (three) times daily.   furosemide 20 MG tablet Commonly known as: LASIX Take 1 tablet (20 mg total) by mouth daily. On Tuesdays and Satudays only   losartan 50 MG tablet Commonly known as: COZAAR Take 1 tablet (50 mg total) by mouth daily.   melatonin 5 MG Tabs Take 5 mg by mouth at bedtime.   Methotrexate 20 MG/0.8ML Sosy Inject into the skin.   omeprazole 20 MG capsule Commonly known as: PRILOSEC TAKE 1 CAPSULE BY MOUTH EVERY DAY   Plaquenil 200 MG tablet Generic drug: hydroxychloroquine Take 2 tablets (400 mg total) by mouth daily.   pramipexole 0.5 MG tablet Commonly known as: MIRAPEX TAKE 1 TABLET BY MOUTH THREE TIMES A DAY   REFRESH OPTIVE OP Place 1 drop into both eyes 3 (three) times daily.   rosuvastatin 10 MG tablet Commonly known as: CRESTOR TAKE 1 TABLET BY MOUTH ON MONDAYS, WEDNESDAYS AND FRIDAYS          Objective:   Physical Exam Skin:        BP 114/76 (BP Location: Left Arm, Patient Position: Sitting, Cuff Size: Normal)   Pulse 79   Temp (!) 96.3 F (35.7 C) (Temporal)   Resp 18   Ht 5\' 4"  (1.626 m)   Wt 246 lb 2 oz (111.6 kg)   SpO2 97%   BMI 42.25 kg/m  General:   Well developed, NAD, BMI noted.  HEENT:  Normocephalic . Face symmetric, atraumatic Lungs:  CTA B Normal respiratory effort, no intercostal retractions, no accessory muscle use. Heart: RRR,  no murmur.  Abdomen:  Not distended, soft, non-tender. No rebound or rigidity.  Specifically not tender with deep palpation at the RUQ. Skin: Not pale. Not jaundice.  See graphic, few ecchymoses at the site of pain. MSK: No TTP at the thoracic spine Lower extremities: no pretibial edema bilaterally  Neurologic:  alert & oriented X3.   Speech normal, gait appropriate for age and unassisted Psych--  Cognition and judgment appear intact.  Cooperative with normal attention span and concentration.  Behavior appropriate. No anxious or depressed appearing.     Assessment      69 year old female, PMH includes Rheumatoid  arthritis, Parkinson's, DJD, morbid obesity, hysterectomy, high cholesterol, hypertension, on methotrexate and Plaquenil, DOE (cath 05/2019) at baseline , presents with  Right mid back pain: As described above, on exam he has some  ecchymoses, she does not recall any injury there, no blisters. I suspect pain is MSK related, could be a atypical presentation of pneumonia or a gallbladder or kidney stones versus others but that is much less likely. She is constipated but I doubt that is related.  No abdominal pain. Udip neg Plan: We agreed to treat as a MSK issue with Tylenol, heating pad and wait for few days. She will call if she sees blisters (shingles?) or if the pain is severe or if she has other symptoms such as abdominal pain, fever or chills. Constipation: She just started MiraLAX.        This visit occurred during the SARS-CoV-2 public health emergency.  Safety protocols were in place, including screening questions prior to the visit, additional usage of staff PPE, and extensive cleaning of exam room while observing appropriate contact time as indicated for disinfecting solutions.

## 2020-02-18 ENCOUNTER — Other Ambulatory Visit: Payer: Self-pay | Admitting: *Deleted

## 2020-02-18 MED ORDER — DULOXETINE HCL 30 MG PO CPEP: 30.0000 mg | ORAL_CAPSULE | Freq: Every day | ORAL | 1 refills | Status: DC

## 2020-02-18 MED ORDER — ROSUVASTATIN CALCIUM 10 MG PO TABS: ORAL_TABLET | ORAL | 1 refills | Status: DC

## 2020-03-05 ENCOUNTER — Other Ambulatory Visit: Payer: Self-pay | Admitting: Family Medicine

## 2020-03-16 ENCOUNTER — Encounter: Payer: Self-pay | Admitting: Family Medicine

## 2020-03-16 ENCOUNTER — Ambulatory Visit (INDEPENDENT_AMBULATORY_CARE_PROVIDER_SITE_OTHER): Payer: Medicare Other | Admitting: Family Medicine

## 2020-03-16 ENCOUNTER — Other Ambulatory Visit: Payer: Self-pay

## 2020-03-16 VITALS — BP 122/66 | HR 79 | Temp 98.1°F | Resp 12 | Ht 64.0 in | Wt 250.0 lb

## 2020-03-16 DIAGNOSIS — E559 Vitamin D deficiency, unspecified: Secondary | ICD-10-CM | POA: Diagnosis not present

## 2020-03-16 DIAGNOSIS — K449 Diaphragmatic hernia without obstruction or gangrene: Secondary | ICD-10-CM

## 2020-03-16 DIAGNOSIS — K5909 Other constipation: Secondary | ICD-10-CM

## 2020-03-16 DIAGNOSIS — D649 Anemia, unspecified: Secondary | ICD-10-CM | POA: Insufficient documentation

## 2020-03-16 DIAGNOSIS — I2510 Atherosclerotic heart disease of native coronary artery without angina pectoris: Secondary | ICD-10-CM

## 2020-03-16 DIAGNOSIS — K219 Gastro-esophageal reflux disease without esophagitis: Secondary | ICD-10-CM | POA: Diagnosis not present

## 2020-03-16 DIAGNOSIS — R0602 Shortness of breath: Secondary | ICD-10-CM

## 2020-03-16 DIAGNOSIS — I10 Essential (primary) hypertension: Secondary | ICD-10-CM | POA: Diagnosis not present

## 2020-03-16 DIAGNOSIS — D509 Iron deficiency anemia, unspecified: Secondary | ICD-10-CM

## 2020-03-16 DIAGNOSIS — R7303 Prediabetes: Secondary | ICD-10-CM

## 2020-03-16 DIAGNOSIS — E669 Obesity, unspecified: Secondary | ICD-10-CM

## 2020-03-16 DIAGNOSIS — E782 Mixed hyperlipidemia: Secondary | ICD-10-CM

## 2020-03-16 LAB — CBC
HCT: 30 % — ABNORMAL LOW (ref 36.0–46.0)
Hemoglobin: 9.2 g/dL — ABNORMAL LOW (ref 12.0–15.0)
MCHC: 30.7 g/dL (ref 30.0–36.0)
MCV: 74.2 fl — ABNORMAL LOW (ref 78.0–100.0)
Platelets: 209 10*3/uL (ref 150.0–400.0)
RBC: 4.04 Mil/uL (ref 3.87–5.11)
RDW: 22.8 % — ABNORMAL HIGH (ref 11.5–15.5)
WBC: 5.9 10*3/uL (ref 4.0–10.5)

## 2020-03-16 LAB — LIPID PANEL
Cholesterol: 151 mg/dL (ref 0–200)
HDL: 69.4 mg/dL (ref >39.00)
LDL Cholesterol: 61 mg/dL (ref 0–99)
NonHDL: 81.89
Total CHOL/HDL Ratio: 2
Triglycerides: 106 mg/dL (ref 0.0–149.0)
VLDL: 21.2 mg/dL (ref 0.0–40.0)

## 2020-03-16 LAB — TSH: TSH: 1.89 u[IU]/mL (ref 0.35–4.50)

## 2020-03-16 LAB — VITAMIN D 25 HYDROXY (VIT D DEFICIENCY, FRACTURES): VITD: 20.49 ng/mL — ABNORMAL LOW (ref 30.00–100.00)

## 2020-03-16 LAB — HEMOGLOBIN A1C: Hgb A1c MFr Bld: 6.2 % (ref 4.6–6.5)

## 2020-03-16 NOTE — Assessment & Plan Note (Signed)
Tolerating statin, encouraged heart healthy diet, avoid trans fats, minimize simple carbs and saturated fats. Increase exercise as tolerated 

## 2020-03-16 NOTE — Assessment & Plan Note (Addendum)
Avoid offending foods, start probiotics. Do not eat large meals in late evening and consider raising head of bed. Referred to gastroenterology for further evaluation. Continue Famotidine and Omeprazole daily

## 2020-03-16 NOTE — Assessment & Plan Note (Signed)
Worsening with weight gain. Very frustrated.

## 2020-03-16 NOTE — Assessment & Plan Note (Signed)
Encouraged increased hydration and fiber in diet. Daily probiotics. If bowels not moving can use MOM 2 tbls po in 4 oz of warm prune juice by mouth every 2-3 days. If no results then repeat in 4 hours with  Dulcolax suppository pr, may repeat again in 4 more hours as needed. Seek care if symptoms worsen. Consider daily Miralax and/or Dulcolax if symptoms persist. Using Miralax with Benefiber.

## 2020-03-16 NOTE — Assessment & Plan Note (Signed)
Well controlled, no changes to meds. Encouraged heart healthy diet such as the DASH diet and exercise as tolerated.  °

## 2020-03-16 NOTE — Progress Notes (Signed)
Subjective:    Patient ID: Kristin [name], female    DOB: 1951/03/11, 69 y.o.   MRN: [id number]  Chief Complaint  Patient presents with  . Follow-up    HPI Patient is in today for follow up on chronic medical concerns. No recent febrile illness or hospitalizations. She is noting some intermittent reflux symptoms despite taking medications twice a day. She notes her parkinson's symptoms but they are stable. She is frustrated with weight gain and worsening fatigue especially with any exertion. Denies CP/palp/HA/congestion/fevers/GI or GU c/o. Taking meds as prescribed  Past Medical History:  Diagnosis Date  . Abnormal vaginal Pap smear   . Acute pharyngitis 02/25/2013  . Anemia   . Anxiety   . Arthritis   . 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 Chicken pox as a child  . Chronic UTI    sees dr Terance Hart  . Constipation 12/07/2015  . Depression with anxiety 11/02/2009   Qualifier: Diagnosis of  By: [name], LPN, [name]   . Dermatitis 07/17/2012  . Esophageal stricture 1994  . Fibroids   . Foot pain, bilateral 06/19/2012  . GERD (gastroesophageal reflux disease)   . GERD (gastroesophageal reflux disease)   . Hiatal hernia   . Hyperglycemia 08/19/2013  . 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 Hyperlipidemia   . Hypertension   . Infertility, female   . Low back pain 10/17/2007   Qualifier: Diagnosis of  By: [name], [name]   . Measles as a child  . Obesity   . Osteoarthritis   . Parkinson disease ([place])   . Plantar fasciitis of left foot 06/19/2012  . Preventative health care 12/19/2015  . Rheumatoid arthritis ([place]) 01/08/2018  . Rosacea 10/05/2014  . Swallowing difficulty   . Urinary frequency 02/25/2013  . Visual floaters 05/11/2014    Past Surgical History:  Procedure Laterality Date  . ABDOMINAL HYSTERECTOMY  2006   total  . esophageal     stretching  . laporoscopy    . LEFT HEART CATH AND CORONARY ANGIOGRAPHY N/A 06/02/2019   Procedure: LEFT HEART  CATH AND CORONARY ANGIOGRAPHY;  Surgeon: Jettie Booze, MD;  Location: [facility];  Service: Cardiovascular;  Laterality: N/A;  . TONSILLECTOMY    . TOTAL HIP ARTHROPLASTY Right 2010  . wisdom teeth extracted      Family History  Problem Relation Age of Onset  . Cancer Mother        breast  . Other Mother        arrythmia  . Mental illness Mother        bipolar  . Hyperlipidemia Mother   . Thyroid disease Mother   . Depression Mother   . Bipolar disorder Mother   . Heart disease Father   . Arthritis Father        rheumatoid  . Hypertension Father   . Hyperlipidemia Father   . Depression Sister   . Mental illness Sister        bipolar  . Parkinson's disease Sister   . Arthritis Sister   . Arthritis Sister   . Arthritis Sister   . Colon cancer Neg Hx   . Esophageal cancer Neg Hx   . Rectal cancer Neg Hx   . Stomach cancer Neg Hx     Social History   Socioeconomic History  . Marital status: Married    Spouse name: Not on file  . Number of children: 1  .  Years of education: Not on file  . Highest education level: Master's degree (e.g., MA, MS, MEng, MEd, MSW, MBA)  Occupational History  . Occupation: Retired Teacher, music  Tobacco Use  . Smoking status: Never Smoker  . Smokeless tobacco: Never Used  Vaping Use  . Vaping Use: Never used  Substance and Sexual Activity  . Alcohol use: Yes    Alcohol/week: 4.0 standard drinks    Types: 4 Glasses of wine per week    Comment: occasionally  . Drug use: No  . Sexual activity: Not Currently    Partners: Male  Other Topics Concern  . Not on file  Social History Narrative   Lives with husband, no major dietary restrictions, retired from teaching      Husband dx with cancer MDS June 2017.   Currently in remission. (11/03/16 pc)      Pt has one biological child the other 3 are adopted      Social Determinants of Health   Financial Resource Strain: Low Risk   . Difficulty of Paying Living  Expenses: Not hard at all  Food Insecurity:   . Worried About Charity fundraiser in the Last Year:   . Arboriculturist in the Last Year:   Transportation Needs: No Transportation Needs  . Lack of Transportation (Medical): No  . Lack of Transportation (Non-Medical): No  Physical Activity:   . Days of Exercise per Week:   . Minutes of Exercise per Session:   Stress:   . Feeling of Stress :   Social Connections:   . Frequency of Communication with Friends and Family:   . Frequency of Social Gatherings with Friends and Family:   . Attends Religious Services:   . Active Member of Clubs or Organizations:   . Attends Archivist Meetings:   Marland Kitchen Marital Status:   Intimate Partner Violence:   . Fear of Current or Ex-Partner:   . Emotionally Abused:   Marland Kitchen Physically Abused:   . Sexually Abused:     Outpatient Medications Prior to Visit  Medication Sig Dispense Refill  . albuterol (VENTOLIN HFA) 108 (90 Base) MCG/ACT inhaler Inhale 2 puffs into the lungs every 6 (six) hours as needed for wheezing or shortness of breath. 18 g 2  . ALPRAZolam (XANAX) 0.25 MG tablet TAKE 1/2 TO 1 TABLET TWICE A DAY AS NEEDED FOR ANXIETY 30 tablet 1  . aspirin EC 81 MG tablet Take 1 tablet (81 mg total) by mouth daily. 90 tablet 1  . B-D TB SYRINGE 1CC/27GX1/2" 27G X 1/2" 1 ML MISC     . carbidopa-levodopa (SINEMET CR) 50-200 MG tablet TAKE 1 TABLET BY MOUTH EVERYDAY AT BEDTIME 90 tablet 1  . carbidopa-levodopa (SINEMET IR) 25-100 MG tablet TAKE 2 TABLETS EVERY MORNING, 2 TABLETS IN THE AFTERNOON, AND 1 TABLET IN THE EVENING 450 tablet 1  . Carboxymethylcellul-Glycerin (REFRESH OPTIVE OP) Place 1 drop into both eyes 3 (three) times daily.    . carvedilol (COREG) 3.125 MG tablet TAKE 1 TABLET BY MOUTH TWICE A DAY WITH MEALS 180 tablet 1  . DULoxetine (CYMBALTA) 30 MG capsule Take 1 capsule (30 mg total) by mouth daily. 90 capsule 1  . escitalopram (LEXAPRO) 20 MG tablet TAKE 1 TABLET BY MOUTH EVERY DAY 90  tablet 2  . famotidine (PEPCID) 40 MG tablet Take 1 tablet (40 mg total) by mouth at bedtime. 90 tablet 1  . folic acid (FOLVITE) 1 MG tablet Take 1 mg  by mouth 3 (three) times daily.     . hydroxychloroquine (PLAQUENIL) 200 MG tablet Take 2 tablets (400 mg total) by mouth daily.    Marland Kitchen losartan (COZAAR) 50 MG tablet Take 1 tablet (50 mg total) by mouth daily. 90 tablet 3  . melatonin 5 MG TABS Take 5 mg by mouth at bedtime.    . Methotrexate 20 MG/0.8ML SOSY Inject into the skin.    Marland Kitchen omeprazole (PRILOSEC) 20 MG capsule Take 1 capsule (20 mg total) by mouth daily. 90 capsule 3  . pramipexole (MIRAPEX) 0.5 MG tablet TAKE 1 TABLET BY MOUTH THREE TIMES A DAY 270 tablet 1  . rosuvastatin (CRESTOR) 10 MG tablet TAKE 1 TABLET BY MOUTH ON MONDAYS, WEDNESDAYS AND FRIDAYS 54 tablet 1  . furosemide (LASIX) 20 MG tablet Take 1 tablet (20 mg total) by mouth daily. On Tuesdays and Satudays only 72 tablet 1   No facility-administered medications prior to visit.    Allergies  Allergen Reactions  . Bactrim [Sulfamethoxazole-Trimethoprim] Other (See Comments)    Oral ulcers and rash  . Penicillins Hives    Did it involve swelling of the face/tongue/throat, SOB, or low BP? No Did it involve sudden or severe rash/hives, skin peeling, or any reaction on the inside of your mouth or nose? No Did you need to seek medical attention at a hospital or doctor's office? No When did it last happen?30+ years ago If all above answers are "NO", may proceed with cephalosporin use.     Review of Systems  Constitutional: Positive for malaise/fatigue. Negative for fever.  HENT: Negative for congestion.   Eyes: Negative for blurred vision.  Respiratory: Positive for shortness of breath.   Cardiovascular: Negative for chest pain, palpitations and leg swelling.  Gastrointestinal: Negative for abdominal pain, blood in stool and nausea.  Genitourinary: Negative for dysuria and frequency.  Musculoskeletal: Positive  for back pain, joint pain and myalgias. Negative for falls.  Skin: Negative for rash.  Neurological: Positive for weakness. Negative for dizziness, loss of consciousness and headaches.  Endo/Heme/Allergies: Negative for environmental allergies.  Psychiatric/Behavioral: Negative for depression. The patient is not nervous/anxious.        Objective:    Physical Exam Vitals and nursing note reviewed.  Constitutional:      General: She is not in acute distress.    Appearance: She is well-developed.  HENT:     Head: Normocephalic and atraumatic.     Nose: Nose normal.  Eyes:     General:        Right eye: No discharge.        Left eye: No discharge.  Cardiovascular:     Rate and Rhythm: Normal rate and regular rhythm.     Heart sounds: No murmur heard.   Pulmonary:     Effort: Pulmonary effort is normal.     Breath sounds: Normal breath sounds.  Abdominal:     General: Bowel sounds are normal.     Palpations: Abdomen is soft.     Tenderness: There is no abdominal tenderness.  Musculoskeletal:     Cervical back: Normal range of motion and neck supple.  Skin:    General: Skin is warm and dry.  Neurological:     Mental Status: She is alert and oriented to person, place, and time.     BP 122/66 (BP Location: Left Arm, Cuff Size: Large)   Pulse 79   Temp 98.1 F (36.7 C) (Temporal)   Resp 12  Ht 5\' 4"  (1.626 m)   Wt 250 lb (113.4 kg)   SpO2 98%   BMI 42.91 kg/m  Wt Readings from Last 3 Encounters:  03/16/20 250 lb (113.4 kg)  02/13/20 246 lb 2 oz (111.6 kg)  12/05/19 247 lb (112 kg)    Diabetic Foot Exam - Simple   No data filed     Lab Results  Component Value Date   WBC 5.9 03/16/2020   HGB 9.2 (L) 03/16/2020   HCT 30.0 (L) 03/16/2020   PLT 209.0 03/16/2020   GLUCOSE 105 (H) 12/05/2019   CHOL 151 03/16/2020   TRIG 106.0 03/16/2020   HDL 69.40 03/16/2020   LDLDIRECT 161.5 10/07/2008   LDLCALC 61 03/16/2020   ALT 5 11/05/2018   AST 18 11/05/2018   NA  144 12/05/2019   K 4.4 12/05/2019   CL 109 (H) 12/05/2019   CREATININE 0.71 12/05/2019   BUN 13 12/05/2019   CO2 23 12/05/2019   TSH 1.89 03/16/2020   INR 2.5 (H) 01/19/2009   HGBA1C 6.2 03/16/2020    Lab Results  Component Value Date   TSH 1.89 03/16/2020   Lab Results  Component Value Date   WBC 5.9 03/16/2020   HGB 9.2 (L) 03/16/2020   HCT 30.0 (L) 03/16/2020   MCV 74.2 (L) 03/16/2020   PLT 209.0 03/16/2020   Lab Results  Component Value Date   NA 144 12/05/2019   K 4.4 12/05/2019   CO2 23 12/05/2019   GLUCOSE 105 (H) 12/05/2019   BUN 13 12/05/2019   CREATININE 0.71 12/05/2019   BILITOT 0.5 11/05/2018   ALKPHOS 91 11/05/2018   AST 18 11/05/2018   ALT 5 11/05/2018   PROT 6.4 11/05/2018   ALBUMIN 4.1 11/05/2018   CALCIUM 9.2 12/05/2019   GFR 90.51 11/05/2018   Lab Results  Component Value Date   CHOL 151 03/16/2020   Lab Results  Component Value Date   HDL 69.40 03/16/2020   Lab Results  Component Value Date   LDLCALC 61 03/16/2020   Lab Results  Component Value Date   TRIG 106.0 03/16/2020   Lab Results  Component Value Date   CHOLHDL 2 03/16/2020   Lab Results  Component Value Date   HGBA1C 6.2 03/16/2020       Assessment & Plan:   Problem List Items Addressed This Visit    Hyperlipidemia    Tolerating statin, encouraged heart healthy diet, avoid trans fats, minimize simple carbs and saturated fats. Increase exercise as tolerated      Relevant Orders   Lipid panel (Completed)   Obesity    Frustrated with weight gain. Encouraged to try the NOOM APP or MIND diet and to increase activity as tolerqte.d      Essential hypertension    Well controlled, no changes to meds. Encouraged heart healthy diet such as the DASH diet and exercise as tolerated.       Relevant Orders   CBC (Completed)   TSH (Completed)   Hiatal hernia with GERD - Primary    Avoid offending foods, start probiotics. Do not eat large meals in late evening and  consider raising head of bed. Referred to gastroenterology for further evaluation. Continue Famotidine and Omeprazole daily      Relevant Orders   Ambulatory referral to Gastroenterology   Constipation    Encouraged increased hydration and fiber in diet. Daily probiotics. If bowels not moving can use MOM 2 tbls po in 4 oz of warm prune  juice by mouth every 2-3 days. If no results then repeat in 4 hours with  Dulcolax suppository pr, may repeat again in 4 more hours as needed. Seek care if symptoms worsen. Consider daily Miralax and/or Dulcolax if symptoms persist. Using Miralax with Benefiber.       Shortness of breath on exertion    Worsening with weight gain. Very frustrated.       Vitamin D deficiency    Supplement and monitor      Relevant Orders   VITAMIN D 25 Hydroxy (Vit-D Deficiency, Fractures) (Completed)   Prediabetes   Relevant Orders   Hemoglobin A1c (Completed)   Anemia    Started on Hemocyte F daily. Check iFob and referred to gastroenterology         I am having Kristin [name] maintain her folic acid, B-D TB SYRINGE 1CC/27GX1/2", ALPRAZolam, losartan, hydroxychloroquine, albuterol, Carboxymethylcellul-Glycerin (REFRESH OPTIVE OP), furosemide, aspirin EC, carbidopa-levodopa, carbidopa-levodopa, famotidine, melatonin, carvedilol, escitalopram, pramipexole, Methotrexate, DULoxetine, rosuvastatin, and omeprazole.  No orders of the defined types were placed in this encounter.    [name], MD

## 2020-03-16 NOTE — Assessment & Plan Note (Signed)
Frustrated with weight gain. Encouraged to try the NOOM APP or MIND diet and to increase activity as tolerqte.d

## 2020-03-16 NOTE — Patient Instructions (Addendum)
NOOM APP for weight loss  Magnesium Glycinate 200-400 mg at bedtime at [email or web address]   Constipation, Adult Constipation is when a person has fewer bowel movements in a week than normal, has difficulty having a bowel movement, or has stools that are dry, hard, or larger than normal. Constipation may be caused by an underlying condition. It may become worse with age if a person takes certain medicines and does not take in enough fluids. Follow these instructions at home: Eating and drinking   Eat foods that have a lot of fiber, such as fresh fruits and vegetables, whole grains, and beans.  Limit foods that are high in fat, low in fiber, or overly processed, such as french fries, hamburgers, cookies, candies, and soda.  Drink enough fluid to keep your urine clear or pale yellow. General instructions  Exercise regularly or as told by your health care provider.  Go to the restroom when you have the urge to go. Do not hold it in.  Take over-the-counter and prescription medicines only as told by your health care provider. These include any fiber supplements.  Practice pelvic floor retraining exercises, such as deep breathing while relaxing the lower abdomen and pelvic floor relaxation during bowel movements.  Watch your condition for any changes.  Keep all follow-up visits as told by your health care provider. This is important. Contact a health care provider if:  You have pain that gets worse.  You have a fever.  You do not have a bowel movement after 4 days.  You vomit.  You are not hungry.  You lose weight.  You are bleeding from the anus.  You have thin, pencil-like stools. Get help right away if:  You have a fever and your symptoms suddenly get worse.  You leak stool or have blood in your stool.  Your abdomen is bloated.  You have severe pain in your abdomen.  You feel dizzy or you faint. This information is not intended to replace advice given to you by  your health care provider. Make sure you discuss any questions you have with your health care provider. Document Revised: 08/03/2017 Document Reviewed: 02/09/2016 Elsevier Patient Education  2020 [employer].

## 2020-03-16 NOTE — Assessment & Plan Note (Signed)
Started on Hemocyte F daily. Check iFob and referred to gastroenterology

## 2020-03-16 NOTE — Assessment & Plan Note (Signed)
Supplement and monitor 

## 2020-03-17 ENCOUNTER — Other Ambulatory Visit: Payer: Self-pay | Admitting: *Deleted

## 2020-03-17 ENCOUNTER — Other Ambulatory Visit: Payer: Self-pay | Admitting: Emergency Medicine

## 2020-03-17 ENCOUNTER — Other Ambulatory Visit (INDEPENDENT_AMBULATORY_CARE_PROVIDER_SITE_OTHER): Payer: Medicare Other

## 2020-03-17 DIAGNOSIS — D509 Iron deficiency anemia, unspecified: Secondary | ICD-10-CM

## 2020-03-17 LAB — IBC + FERRITIN
Ferritin: 5.7 ng/mL — ABNORMAL LOW (ref 10.0–291.0)
Iron: 25 ug/dL — ABNORMAL LOW (ref 42–145)
Saturation Ratios: 4.5 % — ABNORMAL LOW (ref 20.0–50.0)
Transferrin: 401 mg/dL — ABNORMAL HIGH (ref 212.0–360.0)

## 2020-03-17 MED ORDER — VITAMIN D (ERGOCALCIFEROL) 1.25 MG (50000 UNIT) PO CAPS: 50000.0000 [IU] | ORAL_CAPSULE | ORAL | 4 refills | Status: DC

## 2020-03-17 MED ORDER — HEMOCYTE-F 324-1 MG PO TABS: 1.0000 | ORAL_TABLET | Freq: Every day | ORAL | 2 refills | Status: DC

## 2020-03-18 ENCOUNTER — Other Ambulatory Visit: Payer: Self-pay | Admitting: Family Medicine

## 2020-03-18 ENCOUNTER — Encounter: Payer: Self-pay | Admitting: Gastroenterology

## 2020-03-24 ENCOUNTER — Other Ambulatory Visit (INDEPENDENT_AMBULATORY_CARE_PROVIDER_SITE_OTHER): Payer: Medicare Other

## 2020-03-24 ENCOUNTER — Other Ambulatory Visit: Payer: Self-pay

## 2020-03-24 ENCOUNTER — Other Ambulatory Visit: Payer: Self-pay | Admitting: Emergency Medicine

## 2020-03-24 ENCOUNTER — Other Ambulatory Visit: Payer: Medicare Other

## 2020-03-24 DIAGNOSIS — D509 Iron deficiency anemia, unspecified: Secondary | ICD-10-CM

## 2020-03-24 DIAGNOSIS — E611 Iron deficiency: Secondary | ICD-10-CM

## 2020-03-24 LAB — FECAL OCCULT BLOOD, IMMUNOCHEMICAL: Fecal Occult Bld: POSITIVE — AB

## 2020-03-29 ENCOUNTER — Ambulatory Visit: Payer: Medicare Other | Admitting: Neurology

## 2020-03-31 ENCOUNTER — Emergency Department (HOSPITAL_COMMUNITY)
Admission: EM | Admit: 2020-03-31 | Discharge: 2020-04-01 | Disposition: A | Discharge status: Home / Self Care | Payer: Medicare Other | Attending: Emergency Medicine | Admitting: Emergency Medicine

## 2020-03-31 ENCOUNTER — Emergency Department (HOSPITAL_COMMUNITY): Payer: Medicare Other

## 2020-03-31 ENCOUNTER — Other Ambulatory Visit: Payer: Self-pay

## 2020-03-31 ENCOUNTER — Encounter (HOSPITAL_COMMUNITY): Payer: Self-pay

## 2020-03-31 DIAGNOSIS — Z5321 Procedure and treatment not carried out due to patient leaving prior to being seen by health care provider: Secondary | ICD-10-CM | POA: Diagnosis not present

## 2020-03-31 DIAGNOSIS — R0789 Other chest pain: Secondary | ICD-10-CM | POA: Diagnosis not present

## 2020-03-31 DIAGNOSIS — R079 Chest pain, unspecified: Secondary | ICD-10-CM | POA: Diagnosis not present

## 2020-03-31 DIAGNOSIS — K449 Diaphragmatic hernia without obstruction or gangrene: Secondary | ICD-10-CM | POA: Diagnosis not present

## 2020-03-31 DIAGNOSIS — R5383 Other fatigue: Secondary | ICD-10-CM | POA: Diagnosis not present

## 2020-03-31 LAB — TYPE AND SCREEN
ABO/RH(D): O POS
Antibody Screen: NEGATIVE

## 2020-03-31 LAB — TROPONIN I (HIGH SENSITIVITY): Troponin I (High Sensitivity): 4 ng/L (ref <18)

## 2020-03-31 LAB — CBC
HCT: 32.9 % — ABNORMAL LOW (ref 36.0–46.0)
Hemoglobin: 9.8 g/dL — ABNORMAL LOW (ref 12.0–15.0)
MCH: 23.6 pg — ABNORMAL LOW (ref 26.0–34.0)
MCHC: 29.8 g/dL — ABNORMAL LOW (ref 30.0–36.0)
MCV: 79.3 fL — ABNORMAL LOW (ref 80.0–100.0)
Platelets: 265 10*3/uL (ref 150–400)
RBC: 4.15 MIL/uL (ref 3.87–5.11)
RDW: 23.2 % — ABNORMAL HIGH (ref 11.5–15.5)
WBC: 6.8 10*3/uL (ref 4.0–10.5)
nRBC: 0 % (ref 0.0–0.2)

## 2020-03-31 LAB — COMPREHENSIVE METABOLIC PANEL
ALT: 8 U/L (ref 0–44)
AST: 24 U/L (ref 15–41)
Albumin: 4 g/dL (ref 3.5–5.0)
Alkaline Phosphatase: 75 U/L (ref 38–126)
Anion gap: 10 (ref 5–15)
BUN: 16 mg/dL (ref 8–23)
CO2: 24 mmol/L (ref 22–32)
Calcium: 9.1 mg/dL (ref 8.9–10.3)
Chloride: 105 mmol/L (ref 98–111)
Creatinine, Ser: 0.76 mg/dL (ref 0.44–1.00)
GFR calc Af Amer: >60 mL/min (ref >60)
GFR calc non Af Amer: >60 mL/min (ref >60)
Glucose, Bld: 119 mg/dL — ABNORMAL HIGH (ref 70–99)
Potassium: 3.6 mmol/L (ref 3.5–5.1)
Sodium: 139 mmol/L (ref 135–145)
Total Bilirubin: 0.9 mg/dL (ref 0.3–1.2)
Total Protein: 6.6 g/dL (ref 6.5–8.1)

## 2020-03-31 NOTE — ED Triage Notes (Addendum)
Pt reports central chest pain that radiates to her left arm starting around 630 tonight while eating a hamburger. Pt also reports 2 episodes of emesis. Pt states she has been more fatigued today. Hx of parkinsons.   During triage pt also reports she was seen by her PCP for anemia and had a positive fecal occult test, denies any physical blood in her stool.

## 2020-04-01 ENCOUNTER — Telehealth: Payer: Self-pay

## 2020-04-01 ENCOUNTER — Encounter: Payer: Self-pay | Admitting: Family Medicine

## 2020-04-01 ENCOUNTER — Telehealth: Payer: Self-pay | Admitting: Family Medicine

## 2020-04-01 DIAGNOSIS — R0789 Other chest pain: Secondary | ICD-10-CM | POA: Diagnosis not present

## 2020-04-01 LAB — TROPONIN I (HIGH SENSITIVITY): Troponin I (High Sensitivity): 3 ng/L (ref <18)

## 2020-04-01 NOTE — Telephone Encounter (Signed)
New message:   Pt is calling and has some questions about a referral to the GI and she states she was in the hospital last night. Please advise.

## 2020-04-01 NOTE — Telephone Encounter (Signed)
Patient states she has an appointment 8/9 with GI , and she went to the ER due to terrible acid reflux and she wants to know if there is a way she can see another specialist before then because she feels she cant wait until  8/9.

## 2020-04-01 NOTE — ED Notes (Signed)
Patient stating she is going to follow up with primary care an check her mychart. Advised patient to stay but d/t wait she wants to leave.

## 2020-04-01 NOTE — Telephone Encounter (Signed)
Is there anyone in GI who can see her sooner. She had a severe episode of heartburn that took her to the ER last night.

## 2020-04-01 NOTE — Telephone Encounter (Signed)
Nurse Assessment Nurse: Wiser, RN, Heidi Date/Time (Eastern Time): 03/31/2020 9:34:42 PM Confirm and document reason for call. If symptomatic, describe symptoms. ---Caller states his wife was having chest pain, abdominal pain, and neck pain, vomiting x 2, cold sweat. Pain began 3 hours ago. Patient went to the ED approx 20 mins ago. Caller would like a note sent to Dr. Charlett Blake so she is aware that patient has went to the ED. Has the patient had close contact with a person known or suspected to have the novel coronavirus illness OR traveled / lives in area with major community spread (including international travel) in the last 14 days from the onset of symptoms? * If Asymptomatic, screen for exposure and travel within the last 14 days. ---No Does the patient have any new or worsening symptoms? ---Yes Will a triage be completed? ---No Select reason for no triage. ---Other Please document clinical information provided and list any resource used. ---Triage has not been completed. Patient is being evaluated at the ED currently. Disp. Time Eilene Ghazi Time) Disposition Final User 03/31/2020 9:32:47 PM Send to Urgent Natale Milch, [place] 03/31/2020 9:39:11 PM Clinical Call Yes Wiser, RN, Heidi  Pt in ED.

## 2020-04-02 ENCOUNTER — Other Ambulatory Visit: Payer: Self-pay | Admitting: Family Medicine

## 2020-04-02 DIAGNOSIS — E782 Mixed hyperlipidemia: Secondary | ICD-10-CM

## 2020-04-02 DIAGNOSIS — R079 Chest pain, unspecified: Secondary | ICD-10-CM

## 2020-04-02 DIAGNOSIS — I2510 Atherosclerotic heart disease of native coronary artery without angina pectoris: Secondary | ICD-10-CM

## 2020-04-02 DIAGNOSIS — R7303 Prediabetes: Secondary | ICD-10-CM

## 2020-04-02 DIAGNOSIS — I10 Essential (primary) hypertension: Secondary | ICD-10-CM

## 2020-04-02 NOTE — Telephone Encounter (Addendum)
Called GI and advised that patient has been seen in ED and is having severe heartburn and Dr. Charlett Blake would like her seen sooner and they are unable to see her sooner.  Left message on machine that we are unable to get her in.

## 2020-04-02 NOTE — Telephone Encounter (Signed)
Advised patient that cardiology did not have any openings and advised that patient go to ED if she is unable to wait till appt on Tuesday.

## 2020-04-02 NOTE — Telephone Encounter (Signed)
New message:   Pt is returning a call for MA. Please advise.

## 2020-04-06 ENCOUNTER — Encounter: Payer: Self-pay | Admitting: Cardiology

## 2020-04-06 ENCOUNTER — Ambulatory Visit (INDEPENDENT_AMBULATORY_CARE_PROVIDER_SITE_OTHER): Payer: Medicare Other | Admitting: Cardiology

## 2020-04-06 ENCOUNTER — Other Ambulatory Visit: Payer: Self-pay

## 2020-04-06 VITALS — BP 104/68 | HR 76 | Ht 64.0 in | Wt 243.2 lb

## 2020-04-06 DIAGNOSIS — I10 Essential (primary) hypertension: Secondary | ICD-10-CM | POA: Diagnosis not present

## 2020-04-06 DIAGNOSIS — R0789 Other chest pain: Secondary | ICD-10-CM | POA: Diagnosis not present

## 2020-04-06 DIAGNOSIS — E782 Mixed hyperlipidemia: Secondary | ICD-10-CM

## 2020-04-06 DIAGNOSIS — R0602 Shortness of breath: Secondary | ICD-10-CM

## 2020-04-06 DIAGNOSIS — I5189 Other ill-defined heart diseases: Secondary | ICD-10-CM | POA: Diagnosis not present

## 2020-04-06 DIAGNOSIS — I2510 Atherosclerotic heart disease of native coronary artery without angina pectoris: Secondary | ICD-10-CM

## 2020-04-06 DIAGNOSIS — R7303 Prediabetes: Secondary | ICD-10-CM

## 2020-04-06 NOTE — Progress Notes (Signed)
Cardiology Office Note:    Date:  04/06/2020   ID:  Kristin [name], DOB 1951-02-14, MRN [id number]  PCP:  Mosie Lukes, MD  Cardiologist:  Berniece Salines, DO  Electrophysiologist:  None   Referring MD: Mosie Lukes, MD   "I am have been having chest pain"  History of Present Illness:    Kristin [name] is a 69 y.o. female with a hx of minimal coronary artery disease by cardiac catheterization, hypertension, hyperlipidemia, obesity, rheumatoid arthritis and Parkinson's presents today for follow-up visit.  I last saw the patient in April 2021 at that time she seemed to be doing well and she was compliant with her antiacids.  Recently she tells me that she has been eating more acidic foods and noticed that she has been getting significant chest pain.  She had an episode where she had [employer] and had [employer] lemonade.  By the time she got home she started to feel significant chest pain.  She did call EMS who did an EKG and she did show this to me it was normal however EMS advised her that she did not need to go to the emergency department after listening to her story about drinking all of the limiting.  She was advised to stay of lemonade.  She told me she had another episode where she had spicy ingredients with her hamburger and again had chest pain.  She did describe the pain sensation that she gets every time as a burning midsternal feeling that eventually goes away.    This time she went to the emergency department but had to leave as it was taking too long for them to see her.  She did contact her PCP who recommended that she see me for follow-up.  Today she does not have any chest pain.  Past Medical History:  Diagnosis Date  . Abnormal vaginal Pap smear   . Acute pharyngitis 02/25/2013  . Anemia   . Anxiety   . Arthritis   . 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 Chicken pox as a child  . Chronic UTI    sees dr Terance Hart  . Constipation 12/07/2015  .  Depression with anxiety 11/02/2009   Qualifier: Diagnosis of  By: [name], LPN, [name]   . Dermatitis 07/17/2012  . Esophageal stricture 1994  . Fibroids   . Foot pain, bilateral 06/19/2012  . GERD (gastroesophageal reflux disease)   . GERD (gastroesophageal reflux disease)   . Hiatal hernia   . Hyperglycemia 08/19/2013  . 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 Hyperlipidemia   . Hypertension   . Infertility, female   . Low back pain 10/17/2007   Qualifier: Diagnosis of  By: [name], [name]   . Measles as a child  . Obesity   . Osteoarthritis   . Parkinson disease ([place])   . Plantar fasciitis of left foot 06/19/2012  . Preventative health care 12/19/2015  . Rheumatoid arthritis ([place]) 01/08/2018  . Rosacea 10/05/2014  . Swallowing difficulty   . Urinary frequency 02/25/2013  . Visual floaters 05/11/2014    Past Surgical History:  Procedure Laterality Date  . ABDOMINAL HYSTERECTOMY  2006   total  . esophageal     stretching  . laporoscopy    . LEFT HEART CATH AND CORONARY ANGIOGRAPHY N/A 06/02/2019   Procedure: LEFT HEART CATH AND CORONARY ANGIOGRAPHY;  Surgeon: Jettie Booze, MD;  Location: [facility];  Service: Cardiovascular;  Laterality: N/A;  . TONSILLECTOMY    . TOTAL HIP ARTHROPLASTY Right 2010  . wisdom teeth extracted      Current Medications: Current Meds  Medication Sig  . albuterol (VENTOLIN HFA) 108 (90 Base) MCG/ACT inhaler Inhale 2 puffs into the lungs every 6 (six) hours as needed for wheezing or shortness of breath.  . ALPRAZolam (XANAX) 0.25 MG tablet TAKE 1/2 TO 1 TABLET TWICE A DAY AS NEEDED FOR ANXIETY  . aspirin EC 81 MG tablet Take 1 tablet (81 mg total) by mouth daily.  . B-D TB SYRINGE 1CC/27GX1/2" 27G X 1/2" 1 ML MISC   . carbidopa-levodopa (SINEMET CR) 50-200 MG tablet TAKE 1 TABLET BY MOUTH EVERYDAY AT BEDTIME  . carbidopa-levodopa (SINEMET IR) 25-100 MG tablet TAKE 2 TABLETS EVERY MORNING, 2 TABLETS IN THE AFTERNOON, AND 1 TABLET IN  THE EVENING  . Carboxymethylcellul-Glycerin (REFRESH OPTIVE OP) Place 1 drop into both eyes 3 (three) times daily.  . carvedilol (COREG) 3.125 MG tablet TAKE 1 TABLET BY MOUTH TWICE A DAY WITH MEALS  . Cholecalciferol (VITAMIN D3) 50 MCG (2000 UT) TABS Take 1 capsule by mouth daily. Take daily everyday except Thursdays  . DULoxetine (CYMBALTA) 30 MG capsule Take 1 capsule (30 mg total) by mouth daily.  Marland Kitchen escitalopram (LEXAPRO) 20 MG tablet TAKE 1 TABLET BY MOUTH EVERY DAY  . famotidine (PEPCID) 40 MG tablet Take 1 tablet (40 mg total) by mouth at bedtime.  . Ferrous Fumarate-Folic Acid (HEMOCYTE-F) 324-1 MG TABS Take 1 tablet by mouth daily.  . folic acid (FOLVITE) 1 MG tablet Take 1 mg by mouth 3 (three) times daily.   . furosemide (LASIX) 20 MG tablet Take 1 tablet (20 mg total) by mouth daily. On Tuesdays and Satudays only  . hydroxychloroquine (PLAQUENIL) 200 MG tablet Take 2 tablets (400 mg total) by mouth daily.  Marland Kitchen losartan (COZAAR) 50 MG tablet TAKE 1 TABLET BY MOUTH EVERY DAY  . melatonin 5 MG TABS Take 5 mg by mouth at bedtime.  . Methotrexate 20 MG/0.8ML SOSY Inject into the skin.  . methotrexate 50 MG/2ML injection Inject 20 mg into the skin once a week.  Marland Kitchen omeprazole (PRILOSEC) 20 MG capsule Take 1 capsule (20 mg total) by mouth daily.  . pramipexole (MIRAPEX) 0.5 MG tablet TAKE 1 TABLET BY MOUTH THREE TIMES A DAY  . rosuvastatin (CRESTOR) 10 MG tablet TAKE 1 TABLET BY MOUTH ON MONDAYS, WEDNESDAYS AND FRIDAYS  . Vitamin D, Ergocalciferol, (DRISDOL) 1.25 MG (50000 UNIT) CAPS capsule Take 1 capsule (50,000 Units total) by mouth every 7 (seven) days.     Allergies:   Bactrim [sulfamethoxazole-trimethoprim] and Penicillins   Social History   Socioeconomic History  . Marital status: Married    Spouse name: Not on file  . Number of children: 1  . Years of education: Not on file  . Highest education level: Master's degree (e.g., MA, MS, MEng, MEd, MSW, MBA)  Occupational History    . Occupation: Retired Teacher, music  Tobacco Use  . Smoking status: Never Smoker  . Smokeless tobacco: Never Used  Vaping Use  . Vaping Use: Never used  Substance and Sexual Activity  . Alcohol use: Yes    Alcohol/week: 4.0 standard drinks    Types: 4 Glasses of wine per week    Comment: occasionally  . Drug use: No  . Sexual activity: Not Currently    Partners: Male  Other Topics Concern  . Not on file  Social History Narrative  Lives with husband, no major dietary restrictions, retired from teaching      Husband dx with cancer MDS June 2017.   Currently in remission. (11/03/16 pc)      Pt has one biological child the other 3 are adopted      Social Determinants of Health   Financial Resource Strain: Low Risk   . Difficulty of Paying Living Expenses: Not hard at all  Food Insecurity:   . Worried About Charity fundraiser in the Last Year:   . Arboriculturist in the Last Year:   Transportation Needs: No Transportation Needs  . Lack of Transportation (Medical): No  . Lack of Transportation (Non-Medical): No  Physical Activity:   . Days of Exercise per Week:   . Minutes of Exercise per Session:   Stress:   . Feeling of Stress :   Social Connections:   . Frequency of Communication with Friends and Family:   . Frequency of Social Gatherings with Friends and Family:   . Attends Religious Services:   . Active Member of Clubs or Organizations:   . Attends Archivist Meetings:   Marland Kitchen Marital Status:      Family History: The patient's family history includes Arthritis in her father, sister, sister, and sister; Bipolar disorder in her mother; Cancer in her mother; Depression in her mother and sister; Heart disease in her father; Hyperlipidemia in her father and mother; Hypertension in her father; Mental illness in her mother and sister; Other in her mother; Parkinson's disease in her sister; Thyroid disease in her mother. There is no history of Colon cancer,  Esophageal cancer, Rectal cancer, or Stomach cancer.  ROS:   Review of Systems  Constitution: Negative for decreased appetite, fever and weight gain.  HENT: Negative for congestion, ear discharge, hoarse voice and sore throat.   Eyes: Negative for discharge, redness, vision loss in right eye and visual halos.  Cardiovascular: Reports chest pain.  Negative for dyspnea on exertion, leg swelling, orthopnea and palpitations.  Respiratory: Negative for cough, hemoptysis, shortness of breath and snoring.   Endocrine: Negative for heat intolerance and polyphagia.  Hematologic/Lymphatic: Negative for bleeding problem. Does not bruise/bleed easily.  Skin: Negative for flushing, nail changes, rash and suspicious lesions.  Musculoskeletal: Negative for arthritis, joint pain, muscle cramps, myalgias, neck pain and stiffness.  Gastrointestinal: Negative for abdominal pain, bowel incontinence, diarrhea and excessive appetite.  Genitourinary: Negative for decreased libido, genital sores and incomplete emptying.  Neurological: Negative for brief paralysis, focal weakness, headaches and loss of balance.  Psychiatric/Behavioral: Negative for altered mental status, depression and suicidal ideas.  Allergic/Immunologic: Negative for HIV exposure and persistent infections.    EKGs/Labs/Other Studies Reviewed:    The following studies were reviewed today:   EKG: None today  LHC 06/02/2019  Mid RCA lesion is 10% stenosed. Minimal irregularities.  The left ventricular systolic function is normal.  LV end diastolic pressure is normal. LVEDP 13 mm Hg.  The left ventricular ejection fraction is 55-65% by visual estimate.  There is no aortic valve stenosis.  If heart cath needed in the future, woudl not use right radial approach due to tortuosity.  RCA with Anterior takeoff requiring AL2 catheter. No significant CAD. Continue aggressive preventive therapy.   Recent Labs: 12/05/2019: Magnesium  2.1 03/16/2020: TSH 1.89 03/31/2020: ALT 8; BUN 16; Creatinine, Ser 0.76; Hemoglobin 9.8; Platelets 265; Potassium 3.6; Sodium 139  Recent Lipid Panel    Component Value Date/Time   CHOL  151 03/16/2020 1042   CHOL 180 11/27/2016 1123   TRIG 106.0 03/16/2020 1042   HDL 69.40 03/16/2020 1042   HDL 57 11/27/2016 1123   CHOLHDL 2 03/16/2020 1042   VLDL 21.2 03/16/2020 1042   LDLCALC 61 03/16/2020 1042   LDLCALC 88 11/27/2016 1123   LDLDIRECT 161.5 10/07/2008 0944    Physical Exam:    VS:  BP 104/68   Pulse 76   Ht 5\' 4"  (1.626 m)   Wt 243 lb 3.2 oz (110.3 kg)   SpO2 96%   BMI 41.75 kg/m     Wt Readings from Last 3 Encounters:  04/06/20 243 lb 3.2 oz (110.3 kg)  03/31/20 (!) 240 lb (108.9 kg)  03/16/20 250 lb (113.4 kg)     GEN: Well nourished, well developed in no acute distress HEENT: Normal NECK: No JVD; No carotid bruits LYMPHATICS: No lymphadenopathy CARDIAC: S1S2 noted,RRR, no murmurs, rubs, gallops RESPIRATORY:  Clear to auscultation without rales, wheezing or rhonchi  ABDOMEN: Soft, non-tender, non-distended, +bowel sounds, no guarding. EXTREMITIES: No edema, No cyanosis, no clubbing MUSCULOSKELETAL:  No deformity  SKIN: Warm and dry NEUROLOGIC:  Alert and oriented x 3, non-focal PSYCHIATRIC:  Normal affect, good insight  ASSESSMENT:    1. Atypical chest pain   2. Essential hypertension   3. Coronary artery disease involving native coronary artery of native heart without angina pectoris   4. Mixed hyperlipidemia   5. Prediabetes   6. Diastolic dysfunction   7. Shortness of breath on exertion    PLAN:     I suspect that his pain is GI in nature given her noncompliant with diet and worsening peptic ulcer disease.  I did advise her that she really needs to see GI she told me that she does have an appointment coming up in August 9 for the first time to be evaluated by GI as she may need a EGD and possible biopsy for H. pylori.  I think one of the main problems  is the fact that she is not compliant with diet and she continues to eat acidic foods.     Her recent left heart cath was seen September 2020 which showed very minimal coronary artery disease.  Her blood pressures acceptable in the office today.  No changes will be made to her antihypertensive medication.  She is still short of breath which I am convinced that this is multifactorial in the setting of her obesity, sedentary lifestyle.  She was to remain on cautious diuretics given her grade 1 diastolic dysfunction.  Morbid obesity-the patient understands the need to lose weight with diet and exercise. We have discussed specific strategies for this.  The patient is in agreement with the above plan. The patient left the office in stable condition.  The patient will follow up in 6 months or sooner if needed.   Medication Adjustments/Labs and Tests Ordered: Current medicines are reviewed at length with the patient today.  Concerns regarding medicines are outlined above.  No orders of the defined types were placed in this encounter.  No orders of the defined types were placed in this encounter.   Patient Instructions  Medication Instructions:  Your physician recommends that you continue on your current medications as directed. Please refer to the Current Medication list given to you today.  *If you need a refill on your cardiac medications before your next appointment, please call your pharmacy*   Lab Work: None ordered   If you have labs (blood work)  drawn today and your tests are completely normal, you will receive your results only by: Marland Kitchen MyChart Message (if you have MyChart) OR . A paper copy in the mail If you have any lab test that is abnormal or we need to change your treatment, we will call you to review the results.   Testing/Procedures: None ordered    Follow-Up: At [facility], you and your health needs are our priority.  As part of our continuing mission to provide  you with exceptional heart care, we have created designated Provider Care Teams.  These Care Teams include your primary Cardiologist (physician) and Advanced Practice Providers (APPs -  Physician Assistants and Nurse Practitioners) who all work together to provide you with the care you need, when you need it.  We recommend signing up for the patient portal called "MyChart".  Sign up information is provided on this After Visit Summary.  MyChart is used to connect with patients for Virtual Visits (Telemedicine).  Patients are able to view lab/test results, encounter notes, upcoming appointments, etc.  Non-urgent messages can be sent to your provider as well.   To learn more about what you can do with MyChart, go to [email or web address].    Your next appointment:   6 month(s)  The format for your next appointment:   In Person  Provider:   Berniece Salines, DO   Other Instructions None      Adopting a Healthy Lifestyle.  Know what a healthy weight is for you (roughly BMI <25) and aim to maintain this   Aim for 7+ servings of fruits and vegetables daily   65-80+ fluid ounces of water or unsweet tea for healthy kidneys   Limit to max 1 drink of alcohol per day; avoid smoking/tobacco   Limit animal fats in diet for cholesterol and heart health - choose grass fed whenever available   Avoid highly processed foods, and foods high in saturated/trans fats   Aim for low stress - take time to unwind and care for your mental health   Aim for 150 min of moderate intensity exercise weekly for heart health, and weights twice weekly for bone health   Aim for 7-9 hours of sleep daily   When it comes to diets, agreement about the perfect plan isnt easy to find, even among the experts. Experts at the [employer] developed an idea known as the Healthy Eating Plate. Just imagine a plate divided into logical, healthy portions.   The emphasis is on diet quality:   Load up on  vegetables and fruits - one-half of your plate: Aim for color and variety, and remember that potatoes dont count.   Go for whole grains - one-quarter of your plate: Whole wheat, barley, wheat berries, quinoa, oats, brown rice, and foods made with them. If you want pasta, go with whole wheat pasta.   Protein power - one-quarter of your plate: Fish, chicken, beans, and nuts are all healthy, versatile protein sources. Limit red meat.   The diet, however, does go beyond the plate, offering a few other suggestions.   Use healthy plant oils, such as olive, canola, soy, corn, sunflower and peanut. Check the labels, and avoid partially hydrogenated oil, which have unhealthy trans fats.   If youre thirsty, drink water. Coffee and tea are good in moderation, but skip sugary drinks and limit milk and dairy products to one or two daily servings.   The type of carbohydrate in the diet is more important  than the amount. Some sources of carbohydrates, such as vegetables, fruits, whole grains, and beans-are healthier than others.   Finally, stay active  Signed, Berniece Salines, DO  04/06/2020 9:03 AM    [place]

## 2020-04-06 NOTE — Patient Instructions (Signed)
Medication Instructions:  Your physician recommends that you continue on your current medications as directed. Please refer to the Current Medication list given to you today.  *If you need a refill on your cardiac medications before your next appointment, please call your pharmacy*   Lab Work: None ordered   If you have labs (blood work) drawn today and your tests are completely normal, you will receive your results only by: . MyChart Message (if you have MyChart) OR . A paper copy in the mail If you have any lab test that is abnormal or we need to change your treatment, we will call you to review the results.   Testing/Procedures: None ordered    Follow-Up: At [facility], you and your health needs are our priority.  As part of our continuing mission to provide you with exceptional heart care, we have created designated Provider Care Teams.  These Care Teams include your primary Cardiologist (physician) and Advanced Practice Providers (APPs -  Physician Assistants and Nurse Practitioners) who all work together to provide you with the care you need, when you need it.  We recommend signing up for the patient portal called "MyChart".  Sign up information is provided on this After Visit Summary.  MyChart is used to connect with patients for Virtual Visits (Telemedicine).  Patients are able to view lab/test results, encounter notes, upcoming appointments, etc.  Non-urgent messages can be sent to your provider as well.   To learn more about what you can do with MyChart, go to [email or web address].    Your next appointment:   6 month(s)  The format for your next appointment:   In Person  Provider:   Kardie Tobb, DO   Other Instructions None   

## 2020-04-12 ENCOUNTER — Ambulatory Visit (INDEPENDENT_AMBULATORY_CARE_PROVIDER_SITE_OTHER): Payer: Medicare Other | Admitting: Physician Assistant

## 2020-04-12 ENCOUNTER — Encounter: Payer: Self-pay | Admitting: Physician Assistant

## 2020-04-12 VITALS — BP 120/78 | HR 64 | Ht 64.0 in | Wt 242.1 lb

## 2020-04-12 DIAGNOSIS — K5900 Constipation, unspecified: Secondary | ICD-10-CM

## 2020-04-12 DIAGNOSIS — K219 Gastro-esophageal reflux disease without esophagitis: Secondary | ICD-10-CM | POA: Diagnosis not present

## 2020-04-12 DIAGNOSIS — D509 Iron deficiency anemia, unspecified: Secondary | ICD-10-CM

## 2020-04-12 DIAGNOSIS — R195 Other fecal abnormalities: Secondary | ICD-10-CM | POA: Diagnosis not present

## 2020-04-12 MED ORDER — PLENVU 140 G PO SOLR: 1.0000 | ORAL | 0 refills | Status: DC

## 2020-04-12 NOTE — Patient Instructions (Signed)
If you are age 70 or older, your body mass index should be between 23-30. Your Body mass index is 41.56 kg/m. If this is out of the aforementioned range listed, please consider follow up with your Primary Care Provider.  If you are age 7 or younger, your body mass index should be between 19-25. Your Body mass index is 41.56 kg/m. If this is out of the aformentioned range listed, please consider follow up with your Primary Care Provider.   You have been scheduled for a colonoscopy. Please follow written instructions given to you at your visit today.  Please pick up your prep supplies at the pharmacy within the next 1-3 days. If you use inhalers (even only as needed), please bring them with you on the day of your procedure.  Due to recent changes in healthcare laws, you may see the results of your imaging and laboratory studies on MyChart before your provider has had a chance to review them.  We understand that in some cases there may be results that are confusing or concerning to you. Not all laboratory results come back in the same time frame and the provider may be waiting for multiple results in order to interpret others.  Please give [place] 48 hours in order for your provider to thoroughly review all the results before contacting the office for clarification of your results.   Continue Prilose 20 mg every morning Continue Pepcid 40 mg before dinner Follow a strict GERD diet/regimen.  Follow up pending the results of your Colonoscopy/ Endoscopy.

## 2020-04-12 NOTE — Progress Notes (Signed)
Subjective:    Patient ID: Kristin [name], female    DOB: 1950/09/12, 69 y.o.   MRN: [id number]  HPI [name] is a pleasant 69 year old white female, established with Dr. Fuller Plan and last seen in 2014 who comes in today-referred by Dr. Charlett Blake  with new finding of iron deficiency anemia and heme positive stool.  She has also been having episodes of "heartburn". Patient has history of hypertension, mild nonobstructive coronary artery disease, osteoarthritis, rheumatoid arthritis, and Parkinson's disease. She had colonoscopy in 2014 for screening, was found to have a 6 mm polyp in the transverse colon which was hyperplastic and otherwise negative exam.  Very remote EGD in 1994 with moderately large sliding hiatal hernia and was Maloney dilated to 48. Recent labs were done showing hemoglobin 9.2/hematocrit of 30.  Review of chart shows 10 months ago hemoglobin was 11.4. Recent serum iron 25/TIBC 401/ferritin 5.7.  She was started on Hemocyte per Dr. Charlett Blake.  She is to have follow-up labs next week. She has not noted any melena or hematochezia.  She has complained of fatigue.  She has chronic issues with constipation which she attributes to the Parkinson's disease.  She uses MiraLAX daily and Benefiber daily and usually has bowel movement every 3 to 4 days.  She does have issues with increased gassiness. He says since last November she has had about 5 attacks of what she thinks is acute heartburn with chest pain that may last for several hours.  She had been on omeprazole 20 mg p.o. every morning chronically and had Pepcid 40 mg every afternoon added to her regimen in March.  She says she is only had one episode since and that took her to the emergency room. She had recently seen cardiology Dr. Lauralee Evener 04/06/2020.  She was felt to have atypical chest pain very likely GI related.  No further cardiac work-up was recommended.  She is trying to be very careful with her diet currently and following an antireflux  regimen.  Review of Systems Pertinent positive and negative review of systems were noted in the above HPI section.  All other review of systems was otherwise negative.  Outpatient Encounter Medications as of 04/12/2020  Medication Sig  . albuterol (VENTOLIN HFA) 108 (90 Base) MCG/ACT inhaler Inhale 2 puffs into the lungs every 6 (six) hours as needed for wheezing or shortness of breath.  . ALPRAZolam (XANAX) 0.25 MG tablet TAKE 1/2 TO 1 TABLET TWICE A DAY AS NEEDED FOR ANXIETY  . aspirin EC 81 MG tablet Take 1 tablet (81 mg total) by mouth daily.  . B-D TB SYRINGE 1CC/27GX1/2" 27G X 1/2" 1 ML MISC   . carbidopa-levodopa (SINEMET CR) 50-200 MG tablet TAKE 1 TABLET BY MOUTH EVERYDAY AT BEDTIME  . carbidopa-levodopa (SINEMET IR) 25-100 MG tablet TAKE 2 TABLETS EVERY MORNING, 2 TABLETS IN THE AFTERNOON, AND 1 TABLET IN THE EVENING  . Carboxymethylcellul-Glycerin (REFRESH OPTIVE OP) Place 1 drop into both eyes 3 (three) times daily.  . carvedilol (COREG) 3.125 MG tablet TAKE 1 TABLET BY MOUTH TWICE A DAY WITH MEALS  . Cholecalciferol (VITAMIN D3) 50 MCG (2000 UT) TABS Take 1 capsule by mouth daily. Take daily everyday except Thursdays  . DULoxetine (CYMBALTA) 30 MG capsule Take 1 capsule (30 mg total) by mouth daily.  Marland Kitchen escitalopram (LEXAPRO) 20 MG tablet TAKE 1 TABLET BY MOUTH EVERY DAY  . famotidine (PEPCID) 40 MG tablet Take 1 tablet (40 mg total) by mouth at bedtime.  . Ferrous Fumarate-Folic  Acid (HEMOCYTE-F) 324-1 MG TABS Take 1 tablet by mouth daily.  . folic acid (FOLVITE) 1 MG tablet Take 1 mg by mouth 3 (three) times daily.   . hydroxychloroquine (PLAQUENIL) 200 MG tablet Take 2 tablets (400 mg total) by mouth daily.  Marland Kitchen losartan (COZAAR) 50 MG tablet TAKE 1 TABLET BY MOUTH EVERY DAY  . melatonin 5 MG TABS Take 5 mg by mouth at bedtime.  . Methotrexate 20 MG/0.8ML SOSY Inject into the skin.  . methotrexate 50 MG/2ML injection Inject 20 mg into the skin once a week.  Marland Kitchen omeprazole (PRILOSEC)  20 MG capsule Take 1 capsule (20 mg total) by mouth daily.  . pramipexole (MIRAPEX) 0.5 MG tablet TAKE 1 TABLET BY MOUTH THREE TIMES A DAY  . rosuvastatin (CRESTOR) 10 MG tablet TAKE 1 TABLET BY MOUTH ON MONDAYS, WEDNESDAYS AND FRIDAYS  . Vitamin D, Ergocalciferol, (DRISDOL) 1.25 MG (50000 UNIT) CAPS capsule Take 1 capsule (50,000 Units total) by mouth every 7 (seven) days.  . furosemide (LASIX) 20 MG tablet Take 1 tablet (20 mg total) by mouth daily. On Tuesdays and Satudays only  . PEG-KCl-NaCl-NaSulf-Na Asc-C (PLENVU) 140 g SOLR Take 1 kit by mouth as directed. BIN: [id number]; GROUP: [id number]; PCN: [id number]; ID: [id number]; SHOULD BE $50   No facility-administered encounter medications on file as of 04/12/2020.   Allergies  Allergen Reactions  . Bactrim [Sulfamethoxazole-Trimethoprim] Other (See Comments)    Oral ulcers and rash  . Penicillins Hives    Did it involve swelling of the face/tongue/throat, SOB, or low BP? No Did it involve sudden or severe rash/hives, skin peeling, or any reaction on the inside of your mouth or nose? No Did you need to seek medical attention at a hospital or doctor's office? No When did it last happen?30+ years ago If all above answers are "NO", may proceed with cephalosporin use.    Patient Active Problem List   Diagnosis Date Noted  . Anemia 03/16/2020  . Coronary artery disease involving native coronary artery of native heart without angina pectoris 06/12/2019  . Abnormal stress test   . Diastolic dysfunction 85/10/7739  . Cough 05/10/2018  . Rheumatoid arthritis ([place]) 01/08/2018  . Hand pain, right 10/15/2017  . Vitamin D deficiency 12/20/2016  . Prediabetes 12/20/2016  . Shortness of breath on exertion 11/27/2016  . Preventative health care 12/19/2015  . Constipation 12/07/2015  . BCC (basal cell carcinoma of skin) 10/05/2014  . Rosacea 10/05/2014  . Parkinson's disease ([place]) 05/11/2014  . Visual floaters 05/11/2014  . Paralysis agitans  ([place]) 01/08/2014  . Screening for cervical cancer 07/17/2012  . Foot pain, bilateral 06/19/2012  . 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 Obesity 11/12/2009  . Depression with anxiety 11/02/2009  . 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 Lumbar back pain with radiculopathy affecting lower extremity 10/17/2007  . Essential hypertension 07/05/2007  . Osteoarthritis 07/05/2007  . Hyperlipidemia 06/26/2007  . H/O: iron deficiency anemia 05/08/2007  . Hiatal hernia with GERD 05/08/2007   Social History   Socioeconomic History  . Marital status: Married    Spouse name: Not on file  . Number of children: 1  . Years of education: Not on file  . Highest education level: Master's degree (e.g., MA, MS, MEng, MEd, MSW, MBA)  Occupational History  . Occupation: Retired Teacher, music  Tobacco Use  . Smoking status: Never Smoker  . Smokeless tobacco: Never Used  Vaping Use  . Vaping Use: Never used  Substance and Sexual Activity  .  Alcohol use: Yes    Alcohol/week: 4.0 standard drinks    Types: 4 Glasses of wine per week    Comment: occasionally  . Drug use: No  . Sexual activity: Not Currently    Partners: Male  Other Topics Concern  . Not on file  Social History Narrative   Lives with husband, no major dietary restrictions, retired from teaching      Husband dx with cancer MDS June 2017.   Currently in remission. (11/03/16 pc)      Pt has one biological child the other 3 are adopted      Social Determinants of Health   Financial Resource Strain: Low Risk   . Difficulty of Paying Living Expenses: Not hard at all  Food Insecurity:   . Worried About Charity fundraiser in the Last Year:   . Arboriculturist in the Last Year:   Transportation Needs: No Transportation Needs  . Lack of Transportation (Medical): No  . Lack of Transportation (Non-Medical): No  Physical Activity:   . Days of Exercise per Week:   . Minutes of Exercise per Session:   Stress:   . Feeling  of Stress :   Social Connections:   . Frequency of Communication with Friends and Family:   . Frequency of Social Gatherings with Friends and Family:   . Attends Religious Services:   . Active Member of Clubs or Organizations:   . Attends Archivist Meetings:   Marland Kitchen Marital Status:   Intimate Partner Violence:   . Fear of Current or Ex-Partner:   . Emotionally Abused:   Marland Kitchen Physically Abused:   . Sexually Abused:     Ms. Buis family history includes Arthritis in her father, sister, sister, and sister; Bipolar disorder in her mother; Cancer in her mother; Depression in her mother and sister; Heart disease in her father; Hyperlipidemia in her father and mother; Hypertension in her father; Mental illness in her mother and sister; Other in her mother; Parkinson's disease in her sister; Thyroid disease in her mother.      Objective:    Vitals:   04/12/20 1352  BP: 120/78  Pulse: 64    Physical Exam Well-developed well-nourished older WFin no acute distress.  Height, CBJSEG,315 BMI 41.5  HEENT; nontraumatic normocephalic, EOMI, PE RR LA, sclera anicteric. Oropharynx;not done Neck; supple, no JVD Cardiovascular; regular rate and rhythm with S1-S2, no murmur rub or gallop Pulmonary; Clear bilaterally Abdomen; soft, nontender, nondistended, no palpable mass or hepatosplenomegaly, bowel sounds are active Rectal;not done - Skin; benign exam, no jaundice rash or appreciable lesions Extremities; no clubbing cyanosis or edema skin warm and dry Neuro/Psych; alert and oriented x4, grossly nonfocal mood and affect appropriate       Assessment & Plan:   #47 69 year old white female with new finding of iron deficiency anemia with recent hemoglobin of 9.2, and heme positive stool. Etiology not clear, patient has not noted any melena or hematochezia.  Rule out chronic gastropathy, possible Cameron erosions with known moderately large hiatal hernia, AVMs, occult neoplasm. # 2.   Colon cancer screening-last colonoscopy 2014 with 1 small hyperplastic polyp #3 chronic GERD-several episodes of atypical chest pain over the past 9 to 10 months probably related to GERD, rule out esophageal spasm, rule out esophagitis. Patient had cardiac cath September 2020 with mild nonobstructing coronary artery disease and grade 1 diastolic dysfunction  #4 Parkinson's #5.  Rheumatoid arthritis #6.  Hypertension #7 constipation chronic  Plan;  patient will be scheduled for upper endoscopy and colonoscopy with Dr. Fuller Plan.  Both procedures were discussed in detail with the patient including indications risks and benefits and she is agreeable to proceed.  She has completed COVID-19 vaccination. Continue Hemocyte, and serial labs per Dr. Charlett Blake. Continue MiraLAX 17 g in 8 ounces of water daily and Benefiber daily.  Patient may increase MiraLAX to twice daily as needed For now continue omeprazole 20 mg every morning and Pepcid 40 mg every afternoon AC dinner. She was given a copy of an antireflux diet and we reviewed antireflux regimen. Also advised that she may use an antacid i.e. Mylanta with any acute episodes.   Nikkia Devoss S Yashira Offenberger PA-C 04/12/2020   Cc: Mosie Lukes, MD

## 2020-04-12 NOTE — Progress Notes (Signed)
Reviewed and agree with management plan.  Dayshaun Whobrey T. Murray Durrell, MD FACG [facility]  

## 2020-04-19 DIAGNOSIS — R0602 Shortness of breath: Secondary | ICD-10-CM | POA: Diagnosis not present

## 2020-04-19 DIAGNOSIS — M059 Rheumatoid arthritis with rheumatoid factor, unspecified: Secondary | ICD-10-CM | POA: Diagnosis not present

## 2020-04-19 DIAGNOSIS — Z23 Encounter for immunization: Secondary | ICD-10-CM | POA: Diagnosis not present

## 2020-04-19 DIAGNOSIS — Z79899 Other long term (current) drug therapy: Secondary | ICD-10-CM | POA: Diagnosis not present

## 2020-04-19 DIAGNOSIS — M0609 Rheumatoid arthritis without rheumatoid factor, multiple sites: Secondary | ICD-10-CM | POA: Diagnosis not present

## 2020-04-19 DIAGNOSIS — G20 Parkinson's disease: Secondary | ICD-10-CM | POA: Diagnosis not present

## 2020-04-19 DIAGNOSIS — K449 Diaphragmatic hernia without obstruction or gangrene: Secondary | ICD-10-CM | POA: Diagnosis not present

## 2020-04-19 DIAGNOSIS — R0600 Dyspnea, unspecified: Secondary | ICD-10-CM | POA: Diagnosis not present

## 2020-04-19 NOTE — Progress Notes (Signed)
Assessment/Plan:   1. Parkinsons Disease -Continue pramipexole 0.5 mg 3 times per day -Continue carbidopa/levodopa 25/100, 2/2/1 (can chew half to 1 in the middle of the night if awakens with tremor) -She will continue Carbidopa/levodopa 50/200 at night. This has definitely helped restless leg and has helped most of the nighttime cramping of the feet and legs. 2. Depression and anxiety - She is on a combination of Lexapro and Cymbalta 3. Dysphagia -last MBE May, 2021 with mild pharyngeal cervical esophageal deficits.  It was recommended to consider esophageal assessment. She has EGD scheduled  4. Rheumatoid arthritis -now on Plaquenil. 5.REM behavior disorder -Understands safety associated with this.  Does not want more medications. 6.  Constipation             -discussed nature and pathophysiology and association with PD             -discussed importance of hydration.  Pt is to increase water intake             -pt is given a copy of the rancho recipe             -recommended daily colace             -recommended miralax prn, up to bid             -She and I discussed Linzess.  She did recently see the gastroenterology PA and they mentioned chronic constipation.  Pt doing better with increasing fiber.   7.  Insomnia             -discussed sleep habits             -add melatonin.  States pharmacist told her not to with SSRI.  I haven't heard that.  She is going to ask PCP if ok. 8,  Iron deficiency anemia  -on iron supplements  -told her that this could be a bit constipating  Subjective:   Kristin [name] was seen today in follow up for Parkinsons disease.  My previous records were reviewed prior to todays visit as well as outside records available to me. Pt denies falls.  Pt denies lightheadedness, near syncope.  No hallucinations.  Mood has been fair.  Quit going to [facility] because of Covid  concerns.  She had a modified barium swallow study done on Jan 20, 2020.  This demonstrated mild pharyngeal cervical esophageal deficits.  It was recommended to consider esophageal assessment.  Today, she states that she has EGD scheduled on 10/4.  Did talk to her last visit about her constipation and told her to follow-up with primary care about possible Linzess.  She did talk to primary care on July 13 about constipation, but I do not see that they discussed Linzess.  She did recently see the gastroenterology PA for hiatal hernia and reflux.  Does state that she was put on iron supplement recently for anemia.    Current prescribed movement disorder medications: Carbidopa/levodopa 25/100, 2 tablets in the morning, 2 in the afternoon and 1 in the evening Carbidopa/levodopa 50/200 at bedtime Pramipexole 0.5 mg 3 times per day  ALLERGIES:   Allergies  Allergen Reactions  . Bactrim [Sulfamethoxazole-Trimethoprim] Other (See Comments)    Oral ulcers and rash  . Penicillins Hives    Did it involve swelling of the face/tongue/throat, SOB, or low BP? No Did it involve sudden or severe rash/hives, skin peeling, or any reaction on the inside of your mouth or nose? No Did  you need to seek medical attention at a hospital or doctor's office? No When did it last happen?30+ years ago If all above answers are "NO", may proceed with cephalosporin use.     CURRENT MEDICATIONS:  Outpatient Encounter Medications as of 04/22/2020  Medication Sig  . albuterol (VENTOLIN HFA) 108 (90 Base) MCG/ACT inhaler Inhale 2 puffs into the lungs every 6 (six) hours as needed for wheezing or shortness of breath.  . ALPRAZolam (XANAX) 0.25 MG tablet TAKE 1/2 TO 1 TABLET TWICE A DAY AS NEEDED FOR ANXIETY  . aspirin EC 81 MG tablet Take 1 tablet (81 mg total) by mouth daily.  . B-D TB SYRINGE 1CC/27GX1/2" 27G X 1/2" 1 ML MISC   . carbidopa-levodopa (SINEMET CR) 50-200 MG tablet TAKE 1 TABLET BY MOUTH EVERYDAY AT BEDTIME   . carbidopa-levodopa (SINEMET IR) 25-100 MG tablet TAKE 2 TABLETS EVERY MORNING, 2 TABLETS IN THE AFTERNOON, AND 1 TABLET IN THE EVENING  . Carboxymethylcellul-Glycerin (REFRESH OPTIVE OP) Place 1 drop into both eyes 3 (three) times daily.  . carvedilol (COREG) 3.125 MG tablet TAKE 1 TABLET BY MOUTH TWICE A DAY WITH MEALS  . Cholecalciferol (VITAMIN D3) 50 MCG (2000 UT) TABS Take 1 capsule by mouth daily. Take daily everyday except Thursdays  . DULoxetine (CYMBALTA) 30 MG capsule Take 1 capsule (30 mg total) by mouth daily.  Marland Kitchen escitalopram (LEXAPRO) 20 MG tablet TAKE 1 TABLET BY MOUTH EVERY DAY  . famotidine (PEPCID) 40 MG tablet Take 1 tablet (40 mg total) by mouth at bedtime.  . Ferrous Fumarate-Folic Acid (HEMOCYTE-F) 324-1 MG TABS Take 1 tablet by mouth daily.  . folic acid (FOLVITE) 1 MG tablet Take 1 mg by mouth 3 (three) times daily.   . hydroxychloroquine (PLAQUENIL) 200 MG tablet Take 2 tablets (400 mg total) by mouth daily.  Marland Kitchen losartan (COZAAR) 50 MG tablet TAKE 1 TABLET BY MOUTH EVERY DAY  . melatonin 5 MG TABS Take 5 mg by mouth at bedtime.  . Methotrexate 20 MG/0.8ML SOSY Inject into the skin.  . methotrexate 50 MG/2ML injection Inject 20 mg into the skin once a week.  Marland Kitchen omeprazole (PRILOSEC) 20 MG capsule Take 1 capsule (20 mg total) by mouth daily.  . pramipexole (MIRAPEX) 0.5 MG tablet TAKE 1 TABLET BY MOUTH THREE TIMES A DAY  . rosuvastatin (CRESTOR) 10 MG tablet TAKE 1 TABLET BY MOUTH ON MONDAYS, WEDNESDAYS AND FRIDAYS  . Vitamin D, Ergocalciferol, (DRISDOL) 1.25 MG (50000 UNIT) CAPS capsule Take 1 capsule (50,000 Units total) by mouth every 7 (seven) days.  . furosemide (LASIX) 20 MG tablet Take 1 tablet (20 mg total) by mouth daily. On Tuesdays and Satudays only  . [DISCONTINUED] PEG-KCl-NaCl-NaSulf-Na Asc-C (PLENVU) 140 g SOLR Take 1 kit by mouth as directed. BIN: [id number]; GROUP: [id number]; PCN: [id number]; ID: [id number]; SHOULD BE $50 (Patient not taking: Reported on 04/22/2020)    No facility-administered encounter medications on file as of 04/22/2020.    Objective:   PHYSICAL EXAMINATION:    VITALS:   Vitals:   04/22/20 1257  BP: 122/81  Pulse: 71  SpO2: 97%  Weight: 244 lb (110.7 kg)  Height: _0  (1.626 m)    GEN:  The patient appears stated age and is in NAD. HEENT:  Normocephalic, atraumatic.  The mucous membranes are moist. The superficial temporal arteries are without ropiness or tenderness. CV:  RRR Lungs:  CTAB Neck/HEME:  There are no carotid bruits bilaterally.  Neurological examination:  Orientation: The patient is  alert and oriented x3. Cranial nerves: There is good facial symmetry with min facial hypomimia. The speech is fluent and clear. Soft palate rises symmetrically and there is no tongue deviation. Hearing is intact to conversational tone. Sensation: Sensation is intact to light touch throughout Motor: Strength is at least antigravity x4.  Movement examination: Tone: There is mild increased tone in the RUE Abnormal movements: very mild RLE dyskinesia.  Rare LLE tremor with distraction only Coordination:  There is min decremation with RAM's Gait and Station: The patient has no difficulty arising out of a deep-seated chair without the use of the hands. The patient's stride length is very good.    I have reviewed and interpreted the following labs independently    Chemistry      Component Value Date/Time   NA 139 03/31/2020 2216   NA 144 12/05/2019 0935   K 3.6 03/31/2020 2216   CL 105 03/31/2020 2216   CO2 24 03/31/2020 2216   BUN 16 03/31/2020 2216   BUN 13 12/05/2019 0935   CREATININE 0.76 03/31/2020 2216   CREATININE 0.71 01/04/2015 1046      Component Value Date/Time   CALCIUM 9.1 03/31/2020 2216   ALKPHOS 75 03/31/2020 2216   AST 24 03/31/2020 2216   ALT 8 03/31/2020 2216   BILITOT 0.9 03/31/2020 2216   BILITOT 0.4 11/27/2016 1123       Lab Results  Component Value Date   WBC 4.2 04/20/2020   HGB 11.1  (L) 04/20/2020   HCT 34.5 (L) 04/20/2020   MCV 79.9 04/20/2020   PLT 220.0 04/20/2020    Lab Results  Component Value Date   TSH 1.89 03/16/2020     Total time spent on today's visit was 30 minutes, including both face-to-face time and nonface-to-face time.  Time included that spent on review of records (prior notes available to me/labs/imaging if pertinent), discussing treatment and goals, answering patient's questions and coordinating care.  Cc:  Mosie Lukes, MD

## 2020-04-20 ENCOUNTER — Other Ambulatory Visit (INDEPENDENT_AMBULATORY_CARE_PROVIDER_SITE_OTHER): Payer: Medicare Other

## 2020-04-20 ENCOUNTER — Other Ambulatory Visit: Payer: Self-pay

## 2020-04-20 DIAGNOSIS — D509 Iron deficiency anemia, unspecified: Secondary | ICD-10-CM | POA: Diagnosis not present

## 2020-04-20 LAB — IBC + FERRITIN
Ferritin: 19.9 ng/mL (ref 10.0–291.0)
Iron: 56 ug/dL (ref 42–145)
Saturation Ratios: 11.5 % — ABNORMAL LOW (ref 20.0–50.0)
Transferrin: 347 mg/dL (ref 212.0–360.0)

## 2020-04-20 LAB — CBC
HCT: 34.5 % — ABNORMAL LOW (ref 36.0–46.0)
Hemoglobin: 11.1 g/dL — ABNORMAL LOW (ref 12.0–15.0)
MCHC: 32.1 g/dL (ref 30.0–36.0)
MCV: 79.9 fl (ref 78.0–100.0)
Platelets: 220 10*3/uL (ref 150.0–400.0)
RBC: 4.32 Mil/uL (ref 3.87–5.11)
RDW: 27.6 % — ABNORMAL HIGH (ref 11.5–15.5)
WBC: 4.2 10*3/uL (ref 4.0–10.5)

## 2020-04-22 ENCOUNTER — Ambulatory Visit (INDEPENDENT_AMBULATORY_CARE_PROVIDER_SITE_OTHER): Payer: Medicare Other | Admitting: Neurology

## 2020-04-22 ENCOUNTER — Other Ambulatory Visit: Payer: Self-pay

## 2020-04-22 ENCOUNTER — Encounter: Payer: Self-pay | Admitting: Neurology

## 2020-04-22 VITALS — BP 122/81 | HR 71 | Ht 64.0 in | Wt 244.0 lb

## 2020-04-22 DIAGNOSIS — I2510 Atherosclerotic heart disease of native coronary artery without angina pectoris: Secondary | ICD-10-CM

## 2020-04-22 DIAGNOSIS — G20 Parkinson's disease: Secondary | ICD-10-CM | POA: Diagnosis not present

## 2020-04-22 MED ORDER — PRAMIPEXOLE DIHYDROCHLORIDE 0.5 MG PO TABS: 0.5000 mg | ORAL_TABLET | Freq: Three times a day (TID) | ORAL | 1 refills | Status: DC

## 2020-04-22 MED ORDER — CARBIDOPA-LEVODOPA 25-100 MG PO TABS: ORAL_TABLET | ORAL | 1 refills | Status: DC

## 2020-04-22 MED ORDER — CARBIDOPA-LEVODOPA ER 50-200 MG PO TBCR: 1.0000 | EXTENDED_RELEASE_TABLET | Freq: Every day | ORAL | 1 refills | Status: DC

## 2020-04-22 NOTE — Patient Instructions (Signed)
The physicians and staff at [facility] are committed to providing excellent care. You may receive a survey requesting feedback about your experience at our office. We strive to receive "very good" responses to the survey questions. If you feel that your experience would prevent you from giving the office a "very good " response, please contact our office to try to remedy the situation. We may be reached at [phone]. Thank you for taking the time out of your busy day to complete the survey.  

## 2020-05-05 DIAGNOSIS — Z23 Encounter for immunization: Secondary | ICD-10-CM | POA: Diagnosis not present

## 2020-05-08 ENCOUNTER — Ambulatory Visit: Payer: Self-pay

## 2020-05-08 DIAGNOSIS — N390 Urinary tract infection, site not specified: Secondary | ICD-10-CM | POA: Diagnosis not present

## 2020-05-09 ENCOUNTER — Ambulatory Visit (HOSPITAL_COMMUNITY): Payer: Self-pay

## 2020-05-12 DIAGNOSIS — R0602 Shortness of breath: Secondary | ICD-10-CM | POA: Diagnosis not present

## 2020-05-15 ENCOUNTER — Other Ambulatory Visit: Payer: Self-pay | Admitting: Family Medicine

## 2020-05-17 ENCOUNTER — Ambulatory Visit: Payer: Medicare Other | Admitting: Gastroenterology

## 2020-06-07 ENCOUNTER — Ambulatory Visit (AMBULATORY_SURGERY_CENTER): Payer: Medicare Other | Admitting: Gastroenterology

## 2020-06-07 ENCOUNTER — Encounter: Payer: Self-pay | Admitting: Gastroenterology

## 2020-06-07 ENCOUNTER — Other Ambulatory Visit: Payer: Self-pay

## 2020-06-07 VITALS — BP 116/59 | HR 71 | Temp 97.4°F | Resp 23 | Ht 64.0 in | Wt 242.0 lb

## 2020-06-07 DIAGNOSIS — K5730 Diverticulosis of large intestine without perforation or abscess without bleeding: Secondary | ICD-10-CM | POA: Diagnosis not present

## 2020-06-07 DIAGNOSIS — K317 Polyp of stomach and duodenum: Secondary | ICD-10-CM

## 2020-06-07 DIAGNOSIS — R1310 Dysphagia, unspecified: Secondary | ICD-10-CM

## 2020-06-07 DIAGNOSIS — R195 Other fecal abnormalities: Secondary | ICD-10-CM

## 2020-06-07 DIAGNOSIS — K222 Esophageal obstruction: Secondary | ICD-10-CM

## 2020-06-07 DIAGNOSIS — K219 Gastro-esophageal reflux disease without esophagitis: Secondary | ICD-10-CM | POA: Diagnosis not present

## 2020-06-07 DIAGNOSIS — D509 Iron deficiency anemia, unspecified: Secondary | ICD-10-CM | POA: Diagnosis not present

## 2020-06-07 DIAGNOSIS — K640 First degree hemorrhoids: Secondary | ICD-10-CM | POA: Diagnosis not present

## 2020-06-07 DIAGNOSIS — K319 Disease of stomach and duodenum, unspecified: Secondary | ICD-10-CM

## 2020-06-07 MED ORDER — OMEPRAZOLE 40 MG PO CPDR
40.0000 mg | DELAYED_RELEASE_CAPSULE | Freq: Every day | ORAL | 3 refills | Status: DC
Start: 2020-06-07 — End: 2020-10-07

## 2020-06-07 MED ORDER — SODIUM CHLORIDE 0.9 % IV SOLN
500.0000 mL | INTRAVENOUS | Status: DC
Start: 2020-06-07 — End: 2020-06-07

## 2020-06-07 NOTE — Progress Notes (Signed)
pt tolerated well. VSS. awake and to recovery. Report given to RN. Bite block inserted while awake, no complaint and no trauma. Kept in to recovery.

## 2020-06-07 NOTE — Progress Notes (Signed)
Pt spit out bite block on way to recovery. No trauma.

## 2020-06-07 NOTE — Progress Notes (Signed)
Called to room to assist during endoscopic procedure.  Patient ID and intended procedure confirmed with present staff. Received instructions for my participation in the procedure from the performing physician.  

## 2020-06-07 NOTE — Op Note (Signed)
[facility] Patient Name: Kristin [name] Procedure Date: 06/07/2020 2:02 PM MRN: [id number] Endoscopist: Ladene Artist , MD Age: 69 Referring MD:  Date of Birth: 1951-06-24 Gender: Female Account #: [id number] Procedure:                Upper GI endoscopy Indications:              Unexplained iron deficiency anemia, Dysphagia, Heme                            positive stool, Abnormal cine-esophagram Medicines:                Monitored Anesthesia Care Procedure:                Pre-Anesthesia Assessment:                           - Prior to the procedure, a History and Physical                            was performed, and patient medications and                            allergies were reviewed. The patient's tolerance of                            previous anesthesia was also reviewed. The risks                            and benefits of the procedure and the sedation                            options and risks were discussed with the patient.                            All questions were answered, and informed consent                            was obtained. Prior Anticoagulants: The patient has                            taken no previous anticoagulant or antiplatelet                            agents. ASA Grade Assessment: III - A patient with                            severe systemic disease. After reviewing the risks                            and benefits, the patient was deemed in                            satisfactory condition to undergo the procedure.  After obtaining informed consent, the endoscope was                            passed under direct vision. Throughout the                            procedure, the patient's blood pressure, pulse, and                            oxygen saturations were monitored continuously. The                            Endoscope was introduced through the mouth, and                            advanced to  the second part of duodenum. The upper                            GI endoscopy was accomplished without difficulty.                            The patient tolerated the procedure well. Scope In: Scope Out: Findings:                 One benign-appearing, intrinsic moderate stenosis                            was found 32 cm from the incisors. This stenosis                            measured 1.1 cm (inner diameter) x less than one cm                            (in length). The stenosis was traversed. A                            guidewire was placed and the scope was withdrawn.                            Dilation was performed with a Savary dilator with                            mild resistance at 13 mm, 14 mm. Trace heme on the                            last dilator.                           The exam of the esophagus was otherwise normal.                           A large hiatal hernia was present measuring 8 cm.  Multiple localized linear medium erosions with no                            bleeding and no stigmata of recent bleeding were                            found in the gastric fundus associated with the                            hiatal hernia.                           Multiple small sessile polyps with no bleeding and                            no stigmata of recent bleeding were found in the                            gastric fundus and in the gastric body with typical                            features of benign fundic gland polyps.                           The exam of the stomach was otherwise normal.                           The duodenal bulb and second portion of the                            duodenum were normal. Biopsies for histology were                            taken with a cold forceps for evaluation of celiac                            disease. Complications:            No immediate complications. Estimated Blood Loss:     Estimated  blood loss was minimal. Impression:               - Benign-appearing esophageal stenosis. Dilated.                           - Large hiatal hernia.                           - Erosive gastropathy with no stigmata of recent                            bleeding associated with the hiatal hernia, c/w                            Cameron erosions.                           -  Multiple gastric polyps.                           - Normal duodenal bulb and second portion of the                            duodenum. Biopsied. Recommendation:           - Clear liquid diet for 2 hours, then advance as                            tolerated to soft diet today.                           - Resume prior diet tomorrow.                           - Antireflux measures long term.                           - Continue present medications including famotidine                            40 mg po hs long term.                           - Change omeprazole 20 mg to omeprazole 40 mg po                            qam long term, 1 year of refills.                           - Await pathology results.                           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                           - Return to GI office in 6 weeks with me or Amy                            [place], PA-C. Ladene Artist, MD 06/07/2020 3:04:37 PM This report has been signed electronically.

## 2020-06-07 NOTE — Patient Instructions (Signed)
Handouts given for Post Dilation Diet, Esophageal Stricture, Hiatal Hernia, hemorrhoids, diverticulosis, and high fiber diet.  Pick up new Rx- increase in omeprazole dose.  OFFICE VISIT W/ DR STARK OR AMY PA-C IN 6 WEEKS, OFFICE WILL CALL IN THE NEXT COUPLE DAYS OR ELSE PLEASE CALL THE OFFICE.   YOU HAD AN ENDOSCOPIC PROCEDURE TODAY AT [facility]:   Refer to the procedure report that was given to you for any specific questions about what was found during the examination.  If the procedure report does not answer your questions, please call your gastroenterologist to clarify.  If you requested that your care partner not be given the details of your procedure findings, then the procedure report has been included in a sealed envelope for you to review at your convenience later.  YOU SHOULD EXPECT: Some feelings of bloating in the abdomen. Passage of more gas than usual.  Walking can help get rid of the air that was put into your GI tract during the procedure and reduce the bloating. If you had a lower endoscopy (such as a colonoscopy or flexible sigmoidoscopy) you may notice spotting of blood in your stool or on the toilet paper. If you underwent a bowel prep for your procedure, you may not have a normal bowel movement for a few days.  Please Note:  You might notice some irritation and congestion in your nose or some drainage.  This is from the oxygen used during your procedure.  There is no need for concern and it should clear up in a day or so.  SYMPTOMS TO REPORT IMMEDIATELY:   Following lower endoscopy (colonoscopy or flexible sigmoidoscopy):  Excessive amounts of blood in the stool  Significant tenderness or worsening of abdominal pains  Swelling of the abdomen that is new, acute  Fever of 100F or higher   Following upper endoscopy (EGD)  Vomiting of blood or coffee ground material  New chest pain or pain under the shoulder blades  Painful or persistently difficult  swallowing  New shortness of breath  Fever of 100F or higher  Black, tarry-looking stools  For urgent or emergent issues, a gastroenterologist can be reached at any hour by calling [phone]. Do not use MyChart messaging for urgent concerns.    DIET:  SEE POST DILATION DIET HANDOUT.  CLEAR LIQUIDS FOR 2 HOURS, UNTIL 5 PM.  SOFT DIET FOR THE REST OF TODAY, TOMORROW  you may proceed to your regular diet.  Drink plenty of fluids but you should avoid alcoholic beverages for 24 hours.  ACTIVITY:  You should plan to take it easy for the rest of today and you should NOT DRIVE or use heavy machinery until tomorrow (because of the sedation medicines used during the test).    FOLLOW UP: Our staff will call the number listed on your records 48-72 hours following your procedure to check on you and address any questions or concerns that you may have regarding the information given to you following your procedure. If we do not reach you, we will leave a message.  We will attempt to reach you two times.  During this call, we will ask if you have developed any symptoms of COVID 19. If you develop any symptoms (ie: fever, flu-like symptoms, shortness of breath, cough etc.) before then, please call [phone].  If you test positive for Covid 19 in the 2 weeks post procedure, please call and report this information to [place].    If any biopsies were  taken you will be contacted by phone or by letter within the next 1-3 weeks.  Please call us at [phone] if you have not heard about the biopsies in 3 weeks.    SIGNATURES/CONFIDENTIALITY: You and/or your care partner have signed paperwork which will be entered into your electronic medical record.  These signatures attest to the fact that that the information above on your After Visit Summary has been reviewed and is understood.  Full responsibility of the confidentiality of this discharge information lies with you and/or your care-partner.

## 2020-06-07 NOTE — Progress Notes (Signed)
Vs cw  

## 2020-06-07 NOTE — Op Note (Signed)
[facility] Patient Name: Kristin [name] Procedure Date: 06/07/2020 2:03 PM MRN: [id number] Endoscopist: Ladene Artist , MD Age: 69 Referring MD:  Date of Birth: 1951/04/05 Gender: Female Account #: [id number] Procedure:                Colonoscopy Indications:              Heme positive stool, Unexplained iron deficiency                            anemia Medicines:                Monitored Anesthesia Care Procedure:                Pre-Anesthesia Assessment:                           - Prior to the procedure, a History and Physical                            was performed, and patient medications and                            allergies were reviewed. The patient's tolerance of                            previous anesthesia was also reviewed. The risks                            and benefits of the procedure and the sedation                            options and risks were discussed with the patient.                            All questions were answered, and informed consent                            was obtained. Prior Anticoagulants: The patient has                            taken no previous anticoagulant or antiplatelet                            agents. ASA Grade Assessment: III - A patient with                            severe systemic disease. After reviewing the risks                            and benefits, the patient was deemed in                            satisfactory condition to undergo the procedure.  After obtaining informed consent, the colonoscope                            was passed under direct vision. Throughout the                            procedure, the patient's blood pressure, pulse, and                            oxygen saturations were monitored continuously. The                            Colonoscope was introduced through the anus and                            advanced to the the cecum, identified by                             appendiceal orifice and ileocecal valve. The                            ileocecal valve, appendiceal orifice, and rectum                            were photographed. The quality of the bowel                            preparation was excellent. The colonoscopy was                            somewhat difficult due to restricted mobility of                            the colon and a tortuous colon. The patient                            tolerated the procedure well. Scope In: 2:16:01 PM Scope Out: 2:38:05 PM Scope Withdrawal Time: 0 hours 11 minutes 11 seconds  Total Procedure Duration: 0 hours 22 minutes 4 seconds  Findings:                 The perianal and digital rectal examinations were                            normal.                           A few medium-mouthed diverticula were found in the                            sigmoid colon. There was evidence of an impacted                            diverticulum.  Internal hemorrhoids were found during                            retroflexion. The hemorrhoids were small and Grade                            I (internal hemorrhoids that do not prolapse).                           The exam was otherwise without abnormality on                            direct and retroflexion views. Complications:            No immediate complications. Estimated blood loss:                            None. Estimated Blood Loss:     Estimated blood loss: none. Impression:               - Mild diverticulosis in the sigmoid colon. There                            was evidence of an impacted diverticulum.                           - Internal hemorrhoids.                           - The examination was otherwise normal on direct                            and retroflexion views.                           - No specimens collected. Recommendation:           - No plan for repeat screening colonoscopy due to                             age and absence of polyps on todays exam.                           - Patient has a contact number available for                            emergencies. The signs and symptoms of potential                            delayed complications were discussed with the                            patient. Return to normal activities tomorrow.                            Written discharge instructions were provided to the  patient.                           - Resume previous diet.                           - Continue present medications.                           - Proceed with EGD today. Ladene Artist, MD 06/07/2020 2:52:54 PM This report has been signed electronically.

## 2020-06-09 ENCOUNTER — Telehealth: Payer: Self-pay

## 2020-06-09 NOTE — Telephone Encounter (Signed)
  Follow up Call-  Call back number 06/07/2020  Post procedure Call Back phone  # [phone]  Permission to leave phone message Yes  Some recent data might be hidden     Patient questions:  Do you have a fever, pain , or abdominal swelling? No. Pain Score  0 *  Have you tolerated food without any problems? Yes.    Have you been able to return to your normal activities? Yes.    Do you have any questions about your discharge instructions: Diet   No. Medications  No. Follow up visit  No.  Do you have questions or concerns about your Care? No.  Actions: * If pain score is 4 or above: No action needed, pain <4. 1. Have you developed a fever since your procedure? no  2.   Have you had an respiratory symptoms (SOB or cough) since your procedure? no  3.   Have you tested positive for COVID 19 since your procedure no  4.   Have you had any family members/close contacts diagnosed with the COVID 19 since your procedure?  no   If yes to any of these questions please route to Joylene John, RN and Joella Prince, RN

## 2020-06-12 ENCOUNTER — Other Ambulatory Visit: Payer: Self-pay | Admitting: Family Medicine

## 2020-06-14 ENCOUNTER — Other Ambulatory Visit: Payer: Self-pay

## 2020-06-14 ENCOUNTER — Encounter (HOSPITAL_COMMUNITY): Payer: Self-pay

## 2020-06-14 ENCOUNTER — Ambulatory Visit (HOSPITAL_COMMUNITY)
Admission: RE | Admit: 2020-06-14 | Discharge: 2020-06-14 | Disposition: A | Discharge status: Home / Self Care | Payer: Medicare Other | Source: Ambulatory Visit | Attending: Family Medicine | Admitting: Family Medicine

## 2020-06-14 VITALS — BP 139/96 | HR 78 | Temp 98.3°F | Resp 14

## 2020-06-14 DIAGNOSIS — N3090 Cystitis, unspecified without hematuria: Secondary | ICD-10-CM | POA: Diagnosis not present

## 2020-06-14 DIAGNOSIS — N390 Urinary tract infection, site not specified: Secondary | ICD-10-CM

## 2020-06-14 LAB — POCT URINALYSIS DIPSTICK, ED / UC
Glucose, UA: NEGATIVE mg/dL
Hgb urine dipstick: NEGATIVE
Ketones, ur: 15 mg/dL — AB
Nitrite: POSITIVE — AB
Protein, ur: 30 mg/dL — AB
Specific Gravity, Urine: >=1.03 (ref 1.005–1.030)
Urobilinogen, UA: 0.2 mg/dL (ref 0.0–1.0)
pH: 5.5 (ref 5.0–8.0)

## 2020-06-14 MED ORDER — CEPHALEXIN 500 MG PO CAPS
500.0000 mg | ORAL_CAPSULE | Freq: Two times a day (BID) | ORAL | 0 refills | Status: AC
Start: 2020-06-14 — End: 2020-06-21

## 2020-06-14 NOTE — Discharge Instructions (Addendum)
Treating you for a urinary tract infection  Sending the urine for culture.  Medicine as prescribed  Push fluids.

## 2020-06-14 NOTE — ED Triage Notes (Signed)
Pt c/o urinary frequency and dysuria since 9/24. She states she was recently treated for a UTI on 9/4. And completed 10 days of antibiotics. She has been using azo tablets with some relief.

## 2020-06-15 ENCOUNTER — Encounter: Payer: Self-pay | Admitting: Gastroenterology

## 2020-06-15 ENCOUNTER — Other Ambulatory Visit: Payer: Self-pay | Admitting: Family Medicine

## 2020-06-15 LAB — URINE CULTURE

## 2020-06-15 NOTE — ED Provider Notes (Signed)
[place]    CSN: [id number] Arrival date & time: 06/14/20  1156      History   Chief Complaint Chief Complaint  Patient presents with  . Appointment  . Urinary Tract Infection    HPI Kristin [name] is a 69 y.o. female.   Patient is a 69 year old female who presents today for urinary frequency and dysuria since 9/24. She has recurrent urinary tract infections. Recently treated for urinary tract infection on 9/4. Completed 10 days of antibiotics. She has been using AZO tablets with some relief. No fever, chills, nausea, flank pain.     Past Medical History:  Diagnosis Date  . Abnormal vaginal Pap smear   . Acute pharyngitis 02/25/2013  . Anemia   . Anxiety   . Arthritis   . 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 Chicken pox as a child  . Chronic UTI    sees dr Terance Hart  . Constipation 12/07/2015  . Depression with anxiety 11/02/2009   Qualifier: Diagnosis of  By: [name], LPN, [name]   . Dermatitis 07/17/2012  . Esophageal stricture 1994  . Fibroids   . Foot pain, bilateral 06/19/2012  . GERD (gastroesophageal reflux disease)   . GERD (gastroesophageal reflux disease)   . Hiatal hernia   . Hyperglycemia 08/19/2013  . 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 Hyperlipidemia   . Hypertension   . Infertility, female   . Low back pain 10/17/2007   Qualifier: Diagnosis of  By: [name], [name]   . Measles as a child  . Obesity   . Osteoarthritis   . Parkinson disease ([place])   . Plantar fasciitis of left foot 06/19/2012  . Preventative health care 12/19/2015  . Rheumatoid arthritis ([place]) 01/08/2018  . Rosacea 10/05/2014  . Swallowing difficulty   . Urinary frequency 02/25/2013  . Visual floaters 05/11/2014    Patient Active Problem List   Diagnosis Date Noted  . Anemia 03/16/2020  . Coronary artery disease involving native coronary artery of native heart without angina pectoris 06/12/2019  . Abnormal stress test   . Diastolic  dysfunction 83/38/2505  . Cough 05/10/2018  . Rheumatoid arthritis ([place]) 01/08/2018  . Hand pain, right 10/15/2017  . Vitamin D deficiency 12/20/2016  . Prediabetes 12/20/2016  . Shortness of breath on exertion 11/27/2016  . Preventative health care 12/19/2015  . Constipation 12/07/2015  . BCC (basal cell carcinoma of skin) 10/05/2014  . Rosacea 10/05/2014  . Parkinson's disease ([place]) 05/11/2014  . Visual floaters 05/11/2014  . Paralysis agitans ([place]) 01/08/2014  . Screening for cervical cancer 07/17/2012  . Foot pain, bilateral 06/19/2012  . 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 Obesity 11/12/2009  . Depression with anxiety 11/02/2009  . 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 Lumbar back pain with radiculopathy affecting lower extremity 10/17/2007  . Essential hypertension 07/05/2007  . Osteoarthritis 07/05/2007  . Hyperlipidemia 06/26/2007  . H/O: iron deficiency anemia 05/08/2007  . Hiatal hernia with GERD 05/08/2007    Past Surgical History:  Procedure Laterality Date  . ABDOMINAL HYSTERECTOMY  2006   total  . esophageal     stretching  . laporoscopy    . LEFT HEART CATH AND CORONARY ANGIOGRAPHY N/A 06/02/2019   Procedure: LEFT HEART CATH AND CORONARY ANGIOGRAPHY;  Surgeon: Jettie Booze, MD;  Location: [facility];  Service: Cardiovascular;  Laterality: N/A;  . TONSILLECTOMY    . TOTAL HIP ARTHROPLASTY Right 2010  . wisdom teeth  extracted      OB History    Gravida  1   Para  1   Term  1   Preterm      AB      Living  1     SAB      TAB      Ectopic      Multiple      Live Births               Home Medications    Prior to Admission medications   Medication Sig Start Date End Date Taking? Authorizing Provider  aspirin EC 81 MG tablet Take 1 tablet (81 mg total) by mouth daily. 06/12/19   Tobb, Kardie, DO  B-D TB SYRINGE 1CC/27GX1/2" 27G X 1/2" 1 ML MISC  04/01/18   [provider]  carbidopa-levodopa  (SINEMET CR) 50-200 MG tablet Take 1 tablet by mouth at bedtime. 04/22/20   Tat, Eustace Quail, DO  carbidopa-levodopa (SINEMET IR) 25-100 MG tablet 2 at 7am, 2 at 11am, 1 at 4pm 04/22/20   Tat, Rebecca S, DO  carvedilol (COREG) 3.125 MG tablet TAKE 1 TABLET BY MOUTH TWICE A DAY WITH MEALS 06/15/20   Mosie Lukes, MD  cephALEXin (KEFLEX) 500 MG capsule Take 1 capsule (500 mg total) by mouth 2 (two) times daily for 7 days. 06/14/20 06/21/20  Loura Halt A, NP  Cholecalciferol (VITAMIN D3) 50 MCG (2000 UT) TABS Take 1 capsule by mouth daily. Take daily everyday except Thursdays    [provider]  DULoxetine (CYMBALTA) 30 MG capsule Take 1 capsule (30 mg total) by mouth daily. 02/18/20   Mosie Lukes, MD  escitalopram (LEXAPRO) 20 MG tablet TAKE 1 TABLET BY MOUTH EVERY DAY 12/30/19   Mosie Lukes, MD  famotidine (PEPCID) 40 MG tablet TAKE 1 TABLET BY MOUTH EVERYDAY AT BEDTIME 05/17/20   Mosie Lukes, MD  folic acid (FOLVITE) 1 MG tablet Take 1 mg by mouth 3 (three) times daily.  03/21/18   [provider]  furosemide (LASIX) 20 MG tablet Take 1 tablet (20 mg total) by mouth daily. On Tuesdays and Satudays only 06/12/19 06/07/20  Tobb, Godfrey Pick, DO  HEMATINIC/FOLIC ACID 035-0 MG TABS TAKE 1 TABLET BY MOUTH EVERY DAY 06/14/20   Mosie Lukes, MD  hydroxychloroquine (PLAQUENIL) 200 MG tablet Take 2 tablets (400 mg total) by mouth daily. 04/21/19   Mosie Lukes, MD  losartan (COZAAR) 50 MG tablet TAKE 1 TABLET BY MOUTH EVERY DAY 03/18/20   Mosie Lukes, MD  Methotrexate 20 MG/0.8ML SOSY Inject into the skin.    [provider]  methotrexate 50 MG/2ML injection Inject 20 mg into the skin once a week. 04/01/20   [provider]  omeprazole (PRILOSEC) 40 MG capsule Take 1 capsule (40 mg total) by mouth daily. 06/07/20   Ladene Artist, MD  pramipexole (MIRAPEX) 0.5 MG tablet Take 1 tablet (0.5 mg total) by mouth 3 (three) times daily. 04/22/20   Tat, Eustace Quail, DO    rosuvastatin (CRESTOR) 10 MG tablet TAKE 1 TABLET BY MOUTH ON MONDAYS, [facility] AND FRIDAYS 02/18/20   Mosie Lukes, MD  Vitamin D, Ergocalciferol, (DRISDOL) 1.25 MG (50000 UNIT) CAPS capsule Take 1 capsule (50,000 Units total) by mouth every 7 (seven) days. 03/17/20   Mosie Lukes, MD  albuterol (VENTOLIN HFA) 108 (90 Base) MCG/ACT inhaler Inhale 2 puffs into the lungs every 6 (six) hours as needed for wheezing  or shortness of breath. Patient not taking: Reported on 06/07/2020 04/21/19 06/14/20  Mosie Lukes, MD    Family History Family History  Problem Relation Age of Onset  . Cancer Mother        breast  . Other Mother        arrythmia  . Mental illness Mother        bipolar  . Hyperlipidemia Mother   . Thyroid disease Mother   . Depression Mother   . Bipolar disorder Mother   . Heart disease Father   . Arthritis Father        rheumatoid  . Hypertension Father   . Hyperlipidemia Father   . Depression Sister   . Mental illness Sister        bipolar  . Parkinson's disease Sister   . Arthritis Sister   . Arthritis Sister   . Arthritis Sister   . Colon cancer Neg Hx   . Esophageal cancer Neg Hx   . Rectal cancer Neg Hx   . Stomach cancer Neg Hx     Social History Social History   Tobacco Use  . Smoking status: Never Smoker  . Smokeless tobacco: Never Used  Vaping Use  . Vaping Use: Never used  Substance Use Topics  . Alcohol use: Yes    Alcohol/week: 4.0 standard drinks    Types: 4 Glasses of wine per week    Comment: occasionally  . Drug use: No     Allergies   Bactrim [sulfamethoxazole-trimethoprim] and Penicillins   Review of Systems Review of Systems   Physical Exam Triage Vital Signs ED Triage Vitals  Enc Vitals Group     BP 06/14/20 1226 (!) 139/96     Pulse Rate 06/14/20 1226 78     Resp 06/14/20 1226 14     Temp 06/14/20 1226 98.3 F (36.8 C)     Temp Source 06/14/20 1226 Oral     SpO2 06/14/20 1226 100 %     Weight --       Height --      Head Circumference --      Peak Flow --      Pain Score 06/14/20 1212 7     Pain Loc --      Pain Edu? --      Excl. in [place]? --    No data found.  Updated Vital Signs BP (!) 139/96 (BP Location: Left Arm)   Pulse 78   Temp 98.3 F (36.8 C) (Oral)   Resp 14   SpO2 100%   Visual Acuity Right Eye Distance:   Left Eye Distance:   Bilateral Distance:    Right Eye Near:   Left Eye Near:    Bilateral Near:     Physical Exam Vitals and nursing note reviewed.  Constitutional:      General: She is not in acute distress.    Appearance: Normal appearance. She is not ill-appearing, toxic-appearing or diaphoretic.  HENT:     Head: Normocephalic.     Nose: Nose normal.  Eyes:     Conjunctiva/sclera: Conjunctivae normal.  Pulmonary:     Effort: Pulmonary effort is normal.  Musculoskeletal:        General: Normal range of motion.     Cervical back: Normal range of motion.  Skin:    General: Skin is warm and dry.     Findings: No rash.  Neurological:     Mental Status: She is alert.  Psychiatric:        Mood and Affect: Mood normal.      UC Treatments / Results  Labs (all labs ordered are listed, but only abnormal results are displayed) Labs Reviewed  URINE CULTURE - Abnormal; Notable for the following components:      Result Value   Culture MULTIPLE SPECIES PRESENT, SUGGEST RECOLLECTION (*)    All other components within normal limits  POCT URINALYSIS DIPSTICK, ED / UC - Abnormal; Notable for the following components:   Bilirubin Urine SMALL (*)    Ketones, ur 15 (*)    Protein, ur 30 (*)    Nitrite POSITIVE (*)    Leukocytes,Ua TRACE (*)    All other components within normal limits    EKG   Radiology No results found.  Procedures Procedures (including critical care time)  Medications Ordered in UC Medications - No data to display  Initial Impression / Assessment and Plan / UC Course  I have reviewed the triage vital signs and the  nursing notes.  Pertinent labs & imaging results that were available during my care of the patient were reviewed by me and considered in my medical decision making (see chart for details).     Cystitis Urine with trace leuks, positive nitrates and small bilirubin We will go ahead and cover for urinary tract infection with Keflex. Recommend drink plenty of water. Follow up as needed for continued or worsening symptoms  Final Clinical Impressions(s) / UC Diagnoses   Final diagnoses:  Cystitis     Discharge Instructions     Treating you for a urinary tract infection  Sending the urine for culture.  Medicine as prescribed  Push fluids.     ED Prescriptions    Medication Sig Dispense Auth. Provider   cephALEXin (KEFLEX) 500 MG capsule Take 1 capsule (500 mg total) by mouth 2 (two) times daily for 7 days. 14 capsule Viola Kinnick A, NP     PDMP not reviewed this encounter.   Orvan July, NP 06/15/20 1140

## 2020-06-17 ENCOUNTER — Encounter: Payer: Self-pay | Admitting: Family Medicine

## 2020-06-17 ENCOUNTER — Other Ambulatory Visit: Payer: Self-pay

## 2020-06-17 ENCOUNTER — Telehealth (INDEPENDENT_AMBULATORY_CARE_PROVIDER_SITE_OTHER): Payer: Medicare Other | Admitting: Family Medicine

## 2020-06-17 DIAGNOSIS — R350 Frequency of micturition: Secondary | ICD-10-CM

## 2020-06-17 DIAGNOSIS — E559 Vitamin D deficiency, unspecified: Secondary | ICD-10-CM | POA: Diagnosis not present

## 2020-06-17 DIAGNOSIS — N390 Urinary tract infection, site not specified: Secondary | ICD-10-CM | POA: Diagnosis not present

## 2020-06-17 DIAGNOSIS — E782 Mixed hyperlipidemia: Secondary | ICD-10-CM

## 2020-06-17 DIAGNOSIS — R7303 Prediabetes: Secondary | ICD-10-CM

## 2020-06-17 DIAGNOSIS — E669 Obesity, unspecified: Secondary | ICD-10-CM | POA: Diagnosis not present

## 2020-06-17 DIAGNOSIS — G20 Parkinson's disease: Secondary | ICD-10-CM

## 2020-06-17 DIAGNOSIS — I10 Essential (primary) hypertension: Secondary | ICD-10-CM | POA: Diagnosis not present

## 2020-06-17 DIAGNOSIS — D509 Iron deficiency anemia, unspecified: Secondary | ICD-10-CM

## 2020-06-17 DIAGNOSIS — Z23 Encounter for immunization: Secondary | ICD-10-CM | POA: Diagnosis not present

## 2020-06-17 DIAGNOSIS — I2510 Atherosclerotic heart disease of native coronary artery without angina pectoris: Secondary | ICD-10-CM | POA: Diagnosis not present

## 2020-06-17 DIAGNOSIS — G20A1 Parkinson's disease without dyskinesia, without mention of fluctuations: Secondary | ICD-10-CM

## 2020-06-18 ENCOUNTER — Other Ambulatory Visit (INDEPENDENT_AMBULATORY_CARE_PROVIDER_SITE_OTHER): Payer: Medicare Other

## 2020-06-18 DIAGNOSIS — R7303 Prediabetes: Secondary | ICD-10-CM

## 2020-06-18 DIAGNOSIS — R350 Frequency of micturition: Secondary | ICD-10-CM | POA: Diagnosis not present

## 2020-06-18 DIAGNOSIS — I10 Essential (primary) hypertension: Secondary | ICD-10-CM | POA: Diagnosis not present

## 2020-06-18 DIAGNOSIS — D509 Iron deficiency anemia, unspecified: Secondary | ICD-10-CM | POA: Diagnosis not present

## 2020-06-18 DIAGNOSIS — E782 Mixed hyperlipidemia: Secondary | ICD-10-CM | POA: Diagnosis not present

## 2020-06-18 LAB — URINALYSIS, ROUTINE W REFLEX MICROSCOPIC
Bilirubin Urine: NEGATIVE
Ketones, ur: NEGATIVE
Nitrite: NEGATIVE
Specific Gravity, Urine: 1.01 (ref 1.000–1.030)
Total Protein, Urine: NEGATIVE
Urine Glucose: NEGATIVE
Urobilinogen, UA: 0.2 (ref 0.0–1.0)
pH: 6 (ref 5.0–8.0)

## 2020-06-18 LAB — COMPREHENSIVE METABOLIC PANEL
ALT: 3 U/L (ref 0–35)
AST: 15 U/L (ref 0–37)
Albumin: 4.2 g/dL (ref 3.5–5.2)
Alkaline Phosphatase: 70 U/L (ref 39–117)
BUN: 12 mg/dL (ref 6–23)
CO2: 30 mEq/L (ref 19–32)
Calcium: 9.1 mg/dL (ref 8.4–10.5)
Chloride: 104 mEq/L (ref 96–112)
Creatinine, Ser: 0.73 mg/dL (ref 0.40–1.20)
GFR: 83.38 mL/min (ref >60.00)
Glucose, Bld: 99 mg/dL (ref 70–99)
Potassium: 3.9 mEq/L (ref 3.5–5.1)
Sodium: 141 mEq/L (ref 135–145)
Total Bilirubin: 0.6 mg/dL (ref 0.2–1.2)
Total Protein: 6.8 g/dL (ref 6.0–8.3)

## 2020-06-18 LAB — LIPID PANEL
Cholesterol: 143 mg/dL (ref 0–200)
HDL: 59.7 mg/dL (ref >39.00)
LDL Cholesterol: 59 mg/dL (ref 0–99)
NonHDL: 83.68
Total CHOL/HDL Ratio: 2
Triglycerides: 124 mg/dL (ref 0.0–149.0)
VLDL: 24.8 mg/dL (ref 0.0–40.0)

## 2020-06-18 LAB — CBC WITH DIFFERENTIAL/PLATELET
Basophils Absolute: 0 10*3/uL (ref 0.0–0.1)
Basophils Relative: 0.6 % (ref 0.0–3.0)
Eosinophils Absolute: 0.1 10*3/uL (ref 0.0–0.7)
Eosinophils Relative: 1.6 % (ref 0.0–5.0)
HCT: 37.4 % (ref 36.0–46.0)
Hemoglobin: 12.5 g/dL (ref 12.0–15.0)
Lymphocytes Relative: 17.7 % (ref 12.0–46.0)
Lymphs Abs: 1.1 10*3/uL (ref 0.7–4.0)
MCHC: 33.4 g/dL (ref 30.0–36.0)
MCV: 86.1 fl (ref 78.0–100.0)
Monocytes Absolute: 0.5 10*3/uL (ref 0.1–1.0)
Monocytes Relative: 8.2 % (ref 3.0–12.0)
Neutro Abs: 4.5 10*3/uL (ref 1.4–7.7)
Neutrophils Relative %: 71.9 % (ref 43.0–77.0)
Platelets: 212 10*3/uL (ref 150.0–400.0)
RBC: 4.35 Mil/uL (ref 3.87–5.11)
RDW: 19.2 % — ABNORMAL HIGH (ref 11.5–15.5)
WBC: 6.2 10*3/uL (ref 4.0–10.5)

## 2020-06-18 LAB — HEMOGLOBIN A1C: Hgb A1c MFr Bld: 5.9 % (ref 4.6–6.5)

## 2020-06-18 LAB — TSH: TSH: 1.24 u[IU]/mL (ref 0.35–4.50)

## 2020-06-19 LAB — URINE CULTURE
MICRO NUMBER:: 11077365
SPECIMEN QUALITY:: ADEQUATE

## 2020-06-19 LAB — IRON,TIBC AND FERRITIN PANEL
%SAT: 13 % (calc) — ABNORMAL LOW (ref 16–45)
Ferritin: 24 ng/mL (ref 16–288)
Iron: 54 ug/dL (ref 45–160)
TIBC: 413 mcg/dL (calc) (ref 250–450)

## 2020-06-20 NOTE — Assessment & Plan Note (Signed)
Continues with neurology and is doing well. Is staying active

## 2020-06-20 NOTE — Assessment & Plan Note (Signed)
hgba1c acceptable, minimize simple carbs. Increase exercise as tolerated.  

## 2020-06-20 NOTE — Assessment & Plan Note (Signed)
Encouraged DASH diet, decrease po intake and increase exercise as tolerated. Needs 7-8 hours of sleep nightly. Avoid trans fats, eat small, frequent meals every 4-5 hours with lean proteins, complex carbs and healthy fats. Minimize simple carbs 

## 2020-06-20 NOTE — Assessment & Plan Note (Signed)
Tolerating statin, encouraged heart healthy diet, avoid trans fats, minimize simple carbs and saturated fats. Increase exercise as tolerated 

## 2020-06-20 NOTE — Progress Notes (Signed)
Virtual Visit via Video Note  I connected with Kristin [name] on 06/16/20 at  2:20 PM EDT by a video enabled telemedicine application and verified that I am speaking with the correct person using two identifiers.  Location: Patient: home, patient and provider are in visit Provider: office   I discussed the limitations of evaluation and management by telemedicine and the availability of in person appointments. The patient expressed understanding and agreed to proceed. Nani Skillern, CMA was able to get the patient set up on a video visit    Subjective:    Patient ID: Kristin [name], female    DOB: July 10, 1951, 68 y.o.   MRN: [id number]  Chief Complaint  Patient presents with  . followup-3 months    HPI Patient is in today for follow up on chronic medical concerns. No recent febrile illness or hospitalizations. She went to urgent care with urinary symptoms and was placed on Cephelexin and while she feels better but not completely better. Notes fatigue and some abdominal discomfort. Denies CP/palp/SOB/HA/congestion/fevers/GI or GU c/o. Taking meds as prescribed  Past Medical History:  Diagnosis Date  . Abnormal vaginal Pap smear   . Acute pharyngitis 02/25/2013  . Anemia   . Anxiety   . Arthritis   . 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 Chicken pox as a child  . Chronic UTI    sees dr Terance Hart  . Constipation 12/07/2015  . Depression with anxiety 11/02/2009   Qualifier: Diagnosis of  By: [name], LPN, [name]   . Dermatitis 07/17/2012  . Esophageal stricture 1994  . Fibroids   . Foot pain, bilateral 06/19/2012  . GERD (gastroesophageal reflux disease)   . GERD (gastroesophageal reflux disease)   . Hiatal hernia   . Hyperglycemia 08/19/2013  . 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 Hyperlipidemia   . Hypertension   . Infertility, female   . Low back pain 10/17/2007   Qualifier: Diagnosis of  By: [name], [name]   . Measles as a child  . Obesity   .  Osteoarthritis   . Parkinson disease ([place])   . Plantar fasciitis of left foot 06/19/2012  . Preventative health care 12/19/2015  . Rheumatoid arthritis ([place]) 01/08/2018  . Rosacea 10/05/2014  . Swallowing difficulty   . Urinary frequency 02/25/2013  . Visual floaters 05/11/2014    Past Surgical History:  Procedure Laterality Date  . ABDOMINAL HYSTERECTOMY  2006   total  . esophageal     stretching  . laporoscopy    . LEFT HEART CATH AND CORONARY ANGIOGRAPHY N/A 06/02/2019   Procedure: LEFT HEART CATH AND CORONARY ANGIOGRAPHY;  Surgeon: Jettie Booze, MD;  Location: [facility];  Service: Cardiovascular;  Laterality: N/A;  . TONSILLECTOMY    . TOTAL HIP ARTHROPLASTY Right 2010  . wisdom teeth extracted      Family History  Problem Relation Age of Onset  . Cancer Mother        breast  . Other Mother        arrythmia  . Mental illness Mother        bipolar  . Hyperlipidemia Mother   . Thyroid disease Mother   . Depression Mother   . Bipolar disorder Mother   . Heart disease Father   . Arthritis Father        rheumatoid  . Hypertension Father   . Hyperlipidemia Father   . Depression Sister   . Mental  illness Sister        bipolar  . Parkinson's disease Sister   . Arthritis Sister   . Arthritis Sister   . Arthritis Sister   . Colon cancer Neg Hx   . Esophageal cancer Neg Hx   . Rectal cancer Neg Hx   . Stomach cancer Neg Hx     Social History   Socioeconomic History  . Marital status: Married    Spouse name: Not on file  . Number of children: 1  . Years of education: Not on file  . Highest education level: Master's degree (e.g., MA, MS, MEng, MEd, MSW, MBA)  Occupational History  . Occupation: Retired Teacher, music  Tobacco Use  . Smoking status: Never Smoker  . Smokeless tobacco: Never Used  Vaping Use  . Vaping Use: Never used  Substance and Sexual Activity  . Alcohol use: Yes    Alcohol/week: 4.0 standard drinks    Types: 4 Glasses of  wine per week    Comment: occasionally  . Drug use: No  . Sexual activity: Not Currently    Partners: Male  Other Topics Concern  . Not on file  Social History Narrative   Lives with husband, no major dietary restrictions, retired from teaching      Husband dx with cancer MDS June 2017.   Currently in remission. (11/03/16 pc)      Pt has one biological child the other 3 are adopted      Social Determinants of Health   Financial Resource Strain: Low Risk   . Difficulty of Paying Living Expenses: Not hard at all  Food Insecurity:   . Worried About Charity fundraiser in the Last Year: Not on file  . Ran Out of Food in the Last Year: Not on file  Transportation Needs: No Transportation Needs  . Lack of Transportation (Medical): No  . Lack of Transportation (Non-Medical): No  Physical Activity:   . Days of Exercise per Week: Not on file  . Minutes of Exercise per Session: Not on file  Stress:   . Feeling of Stress : Not on file  Social Connections:   . Frequency of Communication with Friends and Family: Not on file  . Frequency of Social Gatherings with Friends and Family: Not on file  . Attends Religious Services: Not on file  . Active Member of Clubs or Organizations: Not on file  . Attends Archivist Meetings: Not on file  . Marital Status: Not on file  Intimate Partner Violence:   . Fear of Current or Ex-Partner: Not on file  . Emotionally Abused: Not on file  . Physically Abused: Not on file  . Sexually Abused: Not on file    Outpatient Medications Prior to Visit  Medication Sig Dispense Refill  . aspirin EC 81 MG tablet Take 1 tablet (81 mg total) by mouth daily. 90 tablet 1  . B-D TB SYRINGE 1CC/27GX1/2" 27G X 1/2" 1 ML MISC     . carbidopa-levodopa (SINEMET CR) 50-200 MG tablet Take 1 tablet by mouth at bedtime. 90 tablet 1  . carbidopa-levodopa (SINEMET IR) 25-100 MG tablet 2 at 7am, 2 at 11am, 1 at 4pm 450 tablet 1  . carvedilol (COREG) 3.125 MG tablet  TAKE 1 TABLET BY MOUTH TWICE A DAY WITH MEALS 180 tablet 1  . cephALEXin (KEFLEX) 500 MG capsule Take 1 capsule (500 mg total) by mouth 2 (two) times daily for 7 days. 14 capsule 0  .  Cholecalciferol (VITAMIN D3) 50 MCG (2000 UT) TABS Take 1 capsule by mouth daily. Take daily everyday except Thursdays    . DULoxetine (CYMBALTA) 30 MG capsule Take 1 capsule (30 mg total) by mouth daily. 90 capsule 1  . escitalopram (LEXAPRO) 20 MG tablet TAKE 1 TABLET BY MOUTH EVERY DAY 90 tablet 2  . famotidine (PEPCID) 40 MG tablet TAKE 1 TABLET BY MOUTH EVERYDAY AT BEDTIME 90 tablet 1  . folic acid (FOLVITE) 1 MG tablet Take 1 mg by mouth 3 (three) times daily.     Marland Kitchen HEMATINIC/FOLIC ACID 720-9 MG TABS TAKE 1 TABLET BY MOUTH EVERY DAY 90 tablet 0  . hydroxychloroquine (PLAQUENIL) 200 MG tablet Take 2 tablets (400 mg total) by mouth daily.    Marland Kitchen losartan (COZAAR) 50 MG tablet TAKE 1 TABLET BY MOUTH EVERY DAY 90 tablet 3  . Methotrexate 20 MG/0.8ML SOSY Inject into the skin.    . methotrexate 50 MG/2ML injection Inject 20 mg into the skin once a week.    Marland Kitchen omeprazole (PRILOSEC) 40 MG capsule Take 1 capsule (40 mg total) by mouth daily. 90 capsule 3  . pramipexole (MIRAPEX) 0.5 MG tablet Take 1 tablet (0.5 mg total) by mouth 3 (three) times daily. 270 tablet 1  . rosuvastatin (CRESTOR) 10 MG tablet TAKE 1 TABLET BY MOUTH ON MONDAYS, WEDNESDAYS AND FRIDAYS 54 tablet 1  . Vitamin D, Ergocalciferol, (DRISDOL) 1.25 MG (50000 UNIT) CAPS capsule Take 1 capsule (50,000 Units total) by mouth every 7 (seven) days. 4 capsule 4  . furosemide (LASIX) 20 MG tablet Take 1 tablet (20 mg total) by mouth daily. On Tuesdays and Satudays only 72 tablet 1   No facility-administered medications prior to visit.    Allergies  Allergen Reactions  . Bactrim [Sulfamethoxazole-Trimethoprim] Other (See Comments)    Oral ulcers and rash  . Penicillins Hives    Did it involve swelling of the face/tongue/throat, SOB, or low BP? No Did it  involve sudden or severe rash/hives, skin peeling, or any reaction on the inside of your mouth or nose? No Did you need to seek medical attention at a hospital or doctor's office? No When did it last happen?30+ years ago If all above answers are "NO", may proceed with cephalosporin use.     Review of Systems  Constitutional: Positive for malaise/fatigue. Negative for fever.  HENT: Negative for congestion.   Eyes: Negative for blurred vision.  Respiratory: Negative for shortness of breath.   Cardiovascular: Negative for chest pain, palpitations and leg swelling.  Gastrointestinal: Positive for abdominal pain. Negative for blood in stool and nausea.  Genitourinary: Positive for frequency. Negative for dysuria and flank pain.  Musculoskeletal: Positive for back pain. Negative for falls.  Skin: Negative for rash.  Neurological: Positive for tremors. Negative for dizziness, loss of consciousness and headaches.  Endo/Heme/Allergies: Negative for environmental allergies.  Psychiatric/Behavioral: Negative for depression. The patient is not nervous/anxious.        Objective:    Physical Exam Constitutional:      Appearance: Normal appearance. She is not ill-appearing.  HENT:     Head: Normocephalic and atraumatic.     Right Ear: External ear normal.     Left Ear: External ear normal.     Nose: Nose normal.  Eyes:     General:        Right eye: No discharge.        Left eye: No discharge.  Pulmonary:     Effort: Pulmonary  effort is normal.  Neurological:     Mental Status: She is alert and oriented to person, place, and time.  Psychiatric:        Behavior: Behavior normal.     There were no vitals taken for this visit. Wt Readings from Last 3 Encounters:  06/07/20 242 lb (109.8 kg)  04/22/20 244 lb (110.7 kg)  04/12/20 242 lb 2 oz (109.8 kg)    Diabetic Foot Exam - Simple   No data filed     Lab Results  Component Value Date   WBC 6.2 06/18/2020   HGB 12.5  06/18/2020   HCT 37.4 06/18/2020   PLT 212.0 06/18/2020   GLUCOSE 99 06/18/2020   CHOL 143 06/18/2020   TRIG 124.0 06/18/2020   HDL 59.70 06/18/2020   LDLDIRECT 161.5 10/07/2008   LDLCALC 59 06/18/2020   ALT 3 06/18/2020   AST 15 06/18/2020   NA 141 06/18/2020   K 3.9 06/18/2020   CL 104 06/18/2020   CREATININE 0.73 06/18/2020   BUN 12 06/18/2020   CO2 30 06/18/2020   TSH 1.24 06/18/2020   INR 2.5 (H) 01/19/2009   HGBA1C 5.9 06/18/2020    Lab Results  Component Value Date   TSH 1.24 06/18/2020   Lab Results  Component Value Date   WBC 6.2 06/18/2020   HGB 12.5 06/18/2020   HCT 37.4 06/18/2020   MCV 86.1 06/18/2020   PLT 212.0 06/18/2020   Lab Results  Component Value Date   NA 141 06/18/2020   K 3.9 06/18/2020   CO2 30 06/18/2020   GLUCOSE 99 06/18/2020   BUN 12 06/18/2020   CREATININE 0.73 06/18/2020   BILITOT 0.6 06/18/2020   ALKPHOS 70 06/18/2020   AST 15 06/18/2020   ALT 3 06/18/2020   PROT 6.8 06/18/2020   ALBUMIN 4.2 06/18/2020   CALCIUM 9.1 06/18/2020   ANIONGAP 10 03/31/2020   GFR 83.38 06/18/2020   Lab Results  Component Value Date   CHOL 143 06/18/2020   Lab Results  Component Value Date   HDL 59.70 06/18/2020   Lab Results  Component Value Date   LDLCALC 59 06/18/2020   Lab Results  Component Value Date   TRIG 124.0 06/18/2020   Lab Results  Component Value Date   CHOLHDL 2 06/18/2020   Lab Results  Component Value Date   HGBA1C 5.9 06/18/2020       Assessment & Plan:   Problem List Items Addressed This Visit    Hyperlipidemia    Tolerating statin, encouraged heart healthy diet, avoid trans fats, minimize simple carbs and saturated fats. Increase exercise as tolerated      Relevant Orders   Lipid panel (Completed)   Obesity    Encouraged DASH diet, decrease po intake and increase exercise as tolerated. Needs 7-8 hours of sleep nightly. Avoid trans fats, eat small, frequent meals every 4-5 hours with lean proteins,  complex carbs and healthy fats. Minimize simple carbs      Essential hypertension   Relevant Orders   CBC with Differential/Platelet (Completed)   Comprehensive metabolic panel (Completed)   TSH (Completed)   Urinary tract infection    She was recently seen at urgent care for UTI and given a course of Cephelexin. She feels better but not completely. Will repeat urine culture.       Parkinson's disease ([place])    Continues with neurology and is doing well. Is staying active       Vitamin D deficiency  Supplement and monitor      Prediabetes    hgba1c acceptable, minimize simple carbs. Increase exercise as tolerated.       Relevant Orders   Hemoglobin A1c (Completed)   Anemia   Relevant Orders   Iron, TIBC and Ferritin Panel (Completed)    Other Visit Diagnoses    Urinary frequency    -  Primary   Relevant Orders   Urinalysis   Urine Culture (Completed)      I am having Kristin [name] maintain her folic acid, B-D TB SYRINGE 1CC/27GX1/2", hydroxychloroquine, furosemide, aspirin EC, escitalopram, Methotrexate, DULoxetine, rosuvastatin, Vitamin D (Ergocalciferol), losartan, methotrexate, Vitamin D3, carbidopa-levodopa, carbidopa-levodopa, pramipexole, famotidine, omeprazole, Hematinic/Folic Acid, cephALEXin, and carvedilol.  No orders of the defined types were placed in this encounter.   I discussed the assessment and treatment plan with the patient. The patient was provided an opportunity to ask questions and all were answered. The patient agreed with the plan and demonstrated an understanding of the instructions.   The patient was advised to call back or seek an in-person evaluation if the symptoms worsen or if the condition fails to improve as anticipated.  I provided 20 minutes of non-face-to-face time during this encounter.   [name], MD

## 2020-06-20 NOTE — Assessment & Plan Note (Signed)
She was recently seen at urgent care for UTI and given a course of Cephelexin. She feels better but not completely. Will repeat urine culture.

## 2020-06-20 NOTE — Assessment & Plan Note (Signed)
Supplement and monitor 

## 2020-06-21 ENCOUNTER — Other Ambulatory Visit: Payer: Self-pay | Admitting: Family Medicine

## 2020-06-21 ENCOUNTER — Telehealth: Payer: Self-pay | Admitting: Gastroenterology

## 2020-06-21 NOTE — Telephone Encounter (Signed)
Do you want Pt to continue Ergocalciferol?  

## 2020-06-21 NOTE — Telephone Encounter (Signed)
Left message for patient to call back  

## 2020-06-22 NOTE — Telephone Encounter (Signed)
Left message for patient to call back  

## 2020-06-22 NOTE — Telephone Encounter (Signed)
Patient is asking about a hiatal hernia repair.  I explained that our physicians are not surgeons and if she wishes to have a repair she can talk with Dr. Fuller Plan at her office visit.  If he feels should be surgically repaired., he can make that referral for her.  She thanked me for the call.

## 2020-06-22 NOTE — Telephone Encounter (Signed)
Pt is requesting a call back from [facility]

## 2020-06-23 ENCOUNTER — Encounter: Payer: Self-pay | Admitting: Family Medicine

## 2020-06-24 DIAGNOSIS — H2513 Age-related nuclear cataract, bilateral: Secondary | ICD-10-CM | POA: Diagnosis not present

## 2020-06-24 DIAGNOSIS — Z79899 Other long term (current) drug therapy: Secondary | ICD-10-CM | POA: Diagnosis not present

## 2020-06-24 DIAGNOSIS — H524 Presbyopia: Secondary | ICD-10-CM | POA: Diagnosis not present

## 2020-06-28 DIAGNOSIS — L658 Other specified nonscarring hair loss: Secondary | ICD-10-CM | POA: Diagnosis not present

## 2020-06-28 DIAGNOSIS — L570 Actinic keratosis: Secondary | ICD-10-CM | POA: Diagnosis not present

## 2020-06-29 ENCOUNTER — Ambulatory Visit: Payer: Medicare Other | Admitting: Occupational Therapy

## 2020-06-29 ENCOUNTER — Ambulatory Visit: Payer: Medicare Other | Attending: Neurology | Admitting: Physical Therapy

## 2020-06-29 ENCOUNTER — Ambulatory Visit: Payer: Medicare Other

## 2020-06-29 ENCOUNTER — Other Ambulatory Visit: Payer: Self-pay

## 2020-06-29 DIAGNOSIS — R471 Dysarthria and anarthria: Secondary | ICD-10-CM | POA: Insufficient documentation

## 2020-06-29 DIAGNOSIS — R2689 Other abnormalities of gait and mobility: Secondary | ICD-10-CM

## 2020-06-29 DIAGNOSIS — R29818 Other symptoms and signs involving the nervous system: Secondary | ICD-10-CM | POA: Insufficient documentation

## 2020-06-29 DIAGNOSIS — R1310 Dysphagia, unspecified: Secondary | ICD-10-CM | POA: Insufficient documentation

## 2020-06-29 NOTE — Therapy (Signed)
[facility] [address] [place], [place], [address] Phone: [phone]   Fax:  [phone]  Patient Details  Name: Kristin [name] MRN: [id number] Date of Birth: Feb 22, 1951 Referring Provider:  Ludwig Clarks, DO  Encounter Date: 06/29/2020  Occupational Therapy Parkinson's Disease Screen  Hand dominance:  left   Physical Performance Test item #4 (donning/doffing jacket):  13.78 sec  9-hole peg test:    RUE  24.03 sec        LUE  18.07 sec  Box & Blocks Test:   RUE  62 blocks        LUE  64 blocks  Change in ability to perform ADLs/IADLs:  no  Pt does not require occupational therapy services at this time.  Recommended occupational therapy screen in   6-9 months    [facility] 06/29/2020, 1:39 PM  [facility] [address] [place], [place], [address] Phone: [phone]   Fax:  [place], OTR/L [facility] [address] [place], [place]  [address] [phone] phone [phone] 06/29/20 1:39 PM

## 2020-06-29 NOTE — Therapy (Signed)
[facility] [address] [place], [place], [address] Phone: [phone]   Fax:  [phone]  Patient Details  Name: Kristin [name] MRN: [id number] Date of Birth: 05/15/1951 Referring Provider:  Mosie Lukes, MD  Encounter Date: 06/29/2020   Speech Therapy Parkinson's Disease Screen        Decibel Level today: low 70s dB  (WNL=70-72 dB) with sound level meter 30cm away from pt's mouth, masked. Pt's conversational volume has remained essentially the same since last treatment course. Pt's speech volume is WNL at this time. SLP encouraged pt to continue her loud /a/ x5 BID.  Pt states she had a modified barium swallow exam (MBSS) and GI consult was recommended. An endoscopy was completed and hiatal hernia was found. Pt is modifying her diet, and amount of her intake (meal size) with good results in that she has seen a decr of her s/sx of swallowing difficulties. Pt reports today that the only thing causing her issues with swallowing is, occasionally, a Tylenol - and she swallows with multiple boluses of water to clear this.    Pertinent findings of MBSS from May 2021 are below:  Pt present with overall functional oropharyngeal with mild pharyngocervical esophageal deficits.  Minimal difficulties transiting/coordinating tablet swallow with liquids noted. She required 3 boluses of thin to transit tablet into pharynx  Oropharyngeal swallow was strong without residuals and no aspiration/penetration.   Of note, pt's symptoms were not replicated during testing and she was challenged to consume liquids sequentially. She was observed to belch a few times during the MBS - no abnormality or backflow at UES/pharynx with belching.      SLP suspects pt's primary symptoms are due to intermittent UES dysfunction and known HH/GERD,  suspect she may experience some spasms that may be trap food and pills at UES.   Recommend consider a dedicated  esophageal evaluation especially given h/o hiatal hernia and stricture.   Pt present with overall functional oropharyngeal with mild pharyngocervical esophageal deficits.  Minimal difficulties transiting/coordinating tablet swallow with liquids noted. She required 3 boluses of thin to transit tablet into pharynx.    At this time, pt does not require skilled ST. She should be scheduled in 6 months for another ST screen, or 6 months after d/c from OT and/or PT recommended from this screen.  [facility] ,[place], [place]  06/29/2020, 1:18 PM  [facility] [address] [place], [place], [address] Phone: [phone]   Fax:  [phone]

## 2020-06-29 NOTE — Therapy (Signed)
[facility] [address] [place], [place], [address] Phone: [phone]   Fax:  [phone]  Patient Details  Name: Kristin [name] MRN: [id number] Date of Birth: Oct 02, 1950 Referring Provider:  Ludwig Clarks, DO  Encounter Date: 06/29/2020  Physical Therapy Parkinson's Disease Screen  Timed Up and Go test:  11.03 sec  10 meter walk test: 10.56 sec = 3.11 ft/sec  5 time sit to stand test: 11.25 sec  Previous Screen Numbers:  12/30/2019:  TUG 12.41 sec, 22M:  2.9 ft/sec, 5x:  11.37 sec Some reports of being off-balance; no falls.  Patient does not require Physical Therapy services at this time.  Recommend Physical Therapy screen in 6-9 months.     Sumedh Shinsato W. 06/29/2020, 1:54 PM  Mady Haagensen, PT 06/29/20 2:07 PM Phone: [phone] Fax: [facility] [address] [place], [place], [address] Phone: [phone]   Fax:  [phone]

## 2020-07-05 ENCOUNTER — Other Ambulatory Visit: Payer: Self-pay | Admitting: Family Medicine

## 2020-07-05 ENCOUNTER — Other Ambulatory Visit: Payer: Self-pay

## 2020-07-05 ENCOUNTER — Ambulatory Visit
Admission: RE | Admit: 2020-07-05 | Discharge: 2020-07-05 | Disposition: A | Discharge status: Home / Self Care | Payer: Medicare Other | Source: Ambulatory Visit | Attending: Family Medicine | Admitting: Family Medicine

## 2020-07-05 DIAGNOSIS — N6489 Other specified disorders of breast: Secondary | ICD-10-CM

## 2020-07-05 DIAGNOSIS — R928 Other abnormal and inconclusive findings on diagnostic imaging of breast: Secondary | ICD-10-CM | POA: Diagnosis not present

## 2020-07-16 ENCOUNTER — Telehealth: Payer: Self-pay

## 2020-07-16 NOTE — Telephone Encounter (Signed)
Nurse Assessment Nurse: Morey Hummingbird, RN, Caitlin Date/Time (Eastern Time): 07/15/2020 4:34:49 PM Confirm and document reason for call. If symptomatic, describe symptoms. ---Caller states wife had gerd, bad attack nausea and gagging and chest pain. Caller states pt has had this happen before. Has been in the ED before for this and has been cleared. Caller responding appropriately, able to walk with no difficulty. Caller took mylanta and has not helped. Does the patient have any new or worsening symptoms? ---Yes Will a triage be completed? ---Yes Related visit to physician within the last 2 weeks? ---N/A Does the PT have any chronic conditions? (i.e. diabetes, asthma, this includes High risk factors for pregnancy, etc.) ---Yes List chronic conditions. ---parkinson's, hx of reflux and takes meds prilosec Is this a behavioral health or substance abuse call? ---No Nurse: Morey Hummingbird, RN, Urban Gibson Date/Time (Eastern Time): 07/15/2020 4:44:28 PM Please select the assessment type ---Verbal order / New medication order Additional Documentation ---[facility] [phone] Does the client directives allow for assistance with medications after hours? ---Yes Other current medications? ---Unknown Medication allergies? ---Yes List medication allergies. ---penicillin, bactrim Does the client directive allow for RN to call in the medication order to the pharmacy? ---Yes PLEASE NOTE: All timestamps contained within this report are represented as [place] Standard Time. CONFIDENTIALTY NOTICE: This fax transmission is intended only for the addressee. It contains information that is legally privileged, confidential or otherwise protected from use or disclosure. If you are not the intended recipient, you are strictly prohibited from reviewing, disclosing, copying using or disseminating any of this information or taking any action in reliance on or regarding this information. If you have received this fax in error, please notify  us immediately by telephone so that we can arrange for its return to [place]. Phone: [phone], Toll-Free: [phone], Fax: [phone] Page: 2 of 3 Call Id: [id number] Nurse Assessment Guidelines Guideline Title Affirmed Question Affirmed Notes Nurse Date/Time Eilene Ghazi Time) Chest Pain [1] Patient says chest pain feels exactly the same as previously diagnosed "heartburn" AND [2] describes burning in chest AND [3] accompanying sour taste in mouth Dulle, RN, [facility] 07/15/2020 4:37:44 PM Disp. Time Eilene Ghazi Time) Disposition Final User 07/15/2020 4:53:38 PM Called On-Call Provider Morey Hummingbird, RN, [facility] 07/15/2020 4:59:42 PM Call PCP within 24 Hours Yes Morey Hummingbird, RN, Docia Barrier Disagree/Comply Comply Caller Understands Yes PreDisposition InappropriateToAsk Care Advice Given Per Guideline CALL PCP WITHIN 24 HOURS: * IF OFFICE WILL BE OPEN: Call the office when it opens tomorrow morning. CALL BACK IF: * Difficulty breathing or unusual sweating occurs * Chest pain increases in frequency, duration or severity * Chest pain lasts over 5 minutes * You become worse CARE ADVICE given per Chest Pain (Adult) guideline. Comments User: Rudell Cobb, RN Date/Time Eilene Ghazi Time): 07/15/2020 4:39:58 PM caller states 7/10 User: Rudell Cobb, RN Date/Time Eilene Ghazi Time): 07/15/2020 4:52:39 PM Caller wanting this nurse to reach out to Dr to see if any other remedies for pt's heartburn. This nurse will reach out to backline and see if possible and discussed will call caller back within 30 mins. caller verbalized understanding and had no further questions. User: Rudell Cobb, RN Date/Time Eilene Ghazi Time): 07/15/2020 4:59:32 PM Called caller and gave on-call instructions. Caller verbalized understanding and had no further questions. Referrals REFERRED TO PCP OFFICE Paging DoctorName Phone DateTime Result/Outcome Message Type Notes [name] - MD [phone] 07/15/2020 4:53:38 PM Called On  Call Provider - Reached Doctor Paged [name] - MD 07/15/2020 4:54:46 PM Spoke with On Call - General Message Result Reached  out to Dr. via backline not via secondary number. Dr. gave instructions for pt to continue mylanta and at home prescriptions and to add on famotidine/pepcid over the counter PLEASE NOTE: All timestamps contained within this report are represented as [place] Standard Time. CONFIDENTIALTY NOTICE: This fax transmission is intended only for the addressee. It contains information that is legally privileged, confidential or otherwise protected from use or disclosure. If you are not the intended recipient, you are strictly prohibited from reviewing, disclosing, copying using or disseminating any of this information or taking any action in reliance on or regarding this information. If you have received this fax in error, please notify us immediately by telephone so that we can arrange for its return to [place]. Phone: [phone], Toll-Free: [phone], Fax: [phone] Page: 3 of 3 Call Id: [id number] Paging DoctorName Phone DateTime Result/Outcome Message Type Notes 20mg  and take 2 tables at bedtime. This nurse verified and will contact caller

## 2020-07-26 DIAGNOSIS — R0600 Dyspnea, unspecified: Secondary | ICD-10-CM | POA: Diagnosis not present

## 2020-07-26 DIAGNOSIS — N3941 Urge incontinence: Secondary | ICD-10-CM | POA: Diagnosis not present

## 2020-07-26 DIAGNOSIS — M0609 Rheumatoid arthritis without rheumatoid factor, multiple sites: Secondary | ICD-10-CM | POA: Diagnosis not present

## 2020-07-26 DIAGNOSIS — Z23 Encounter for immunization: Secondary | ICD-10-CM | POA: Diagnosis not present

## 2020-07-26 DIAGNOSIS — E559 Vitamin D deficiency, unspecified: Secondary | ICD-10-CM | POA: Diagnosis not present

## 2020-07-26 DIAGNOSIS — G20 Parkinson's disease: Secondary | ICD-10-CM | POA: Diagnosis not present

## 2020-07-26 DIAGNOSIS — R3915 Urgency of urination: Secondary | ICD-10-CM | POA: Diagnosis not present

## 2020-07-26 DIAGNOSIS — M059 Rheumatoid arthritis with rheumatoid factor, unspecified: Secondary | ICD-10-CM | POA: Diagnosis not present

## 2020-07-26 DIAGNOSIS — Z79899 Other long term (current) drug therapy: Secondary | ICD-10-CM | POA: Diagnosis not present

## 2020-08-01 ENCOUNTER — Other Ambulatory Visit: Payer: Self-pay | Admitting: Family Medicine

## 2020-08-03 ENCOUNTER — Ambulatory Visit (INDEPENDENT_AMBULATORY_CARE_PROVIDER_SITE_OTHER): Payer: Medicare Other | Admitting: Gastroenterology

## 2020-08-03 ENCOUNTER — Encounter: Payer: Self-pay | Admitting: Gastroenterology

## 2020-08-03 VITALS — BP 130/82 | HR 75 | Ht 64.0 in | Wt 237.0 lb

## 2020-08-03 DIAGNOSIS — K219 Gastro-esophageal reflux disease without esophagitis: Secondary | ICD-10-CM

## 2020-08-03 DIAGNOSIS — K449 Diaphragmatic hernia without obstruction or gangrene: Secondary | ICD-10-CM

## 2020-08-03 DIAGNOSIS — D500 Iron deficiency anemia secondary to blood loss (chronic): Secondary | ICD-10-CM | POA: Diagnosis not present

## 2020-08-03 DIAGNOSIS — I2510 Atherosclerotic heart disease of native coronary artery without angina pectoris: Secondary | ICD-10-CM | POA: Diagnosis not present

## 2020-08-03 NOTE — Patient Instructions (Signed)
Thank you for choosing me and [facility].  Malcolm T. Stark, Jr., MD., FACG  

## 2020-08-03 NOTE — Progress Notes (Signed)
    History of Present Illness: This is a 69 year old female with iron deficiency anemia, GERD, esophageal stricture and large hiatal hernia.  Recent EGD and colonoscopy as below.  Her dysphagia has completely resolved.  She is currently maintained on iron.  No active GI complaints.  Colonoscopy 06/2020 diverticulosis, internal hemorrhoids EGD 06/2020 esophageal stricture, large HH, Cameron erosions, duodenal biopsies-normal  Current Medications, Allergies, Past Medical History, Past Surgical History, Family History and Social History were reviewed in [employer] record.   Physical Exam: General: Well developed, well nourished, no acute distress Head: Normocephalic and atraumatic Eyes:  sclerae anicteric, EOMI Ears: Normal auditory acuity Mouth: Not examined, mask on during Covid-19 pandemic Lungs: Clear throughout to auscultation Heart: Regular rate and rhythm; no murmurs, rubs or bruits Abdomen: Soft, non tender and non distended. No masses, hepatosplenomegaly or hernias noted. Normal Bowel sounds Rectal: Not done Musculoskeletal: Symmetrical with no gross deformities  Pulses:  Normal pulses noted Extremities: No clubbing, cyanosis, edema or deformities noted Neurological: Alert oriented x 4, grossly nonfocal Psychological:  Alert and cooperative. Normal mood and affect   Assessment and Recommendations:  1. IDA secondary to [facility] erosions with a large hiatal hernia.  Anticipate recurrent iron deficiency anemia with chronic blood loss from [facility] erosions.  We discussed the chronic nature of Cameron erosions due to large hiatal hernia.  We discussed the possibility of surgical referral for consideration of hiatal hernia repair and she declines at this time.  I addressed her questions to her satisfaction.  Ongoing management of iron deficiency with her PCP.  2. GERD with esophageal stricture, large HH.  Closely follow antireflux measures.  Continue omeprazole  40 mg p.o. every morning and famotidine 40 mg nightly. REV in 1 year.

## 2020-08-11 DIAGNOSIS — M6289 Other specified disorders of muscle: Secondary | ICD-10-CM | POA: Diagnosis not present

## 2020-08-11 DIAGNOSIS — N3941 Urge incontinence: Secondary | ICD-10-CM | POA: Diagnosis not present

## 2020-08-11 DIAGNOSIS — M6281 Muscle weakness (generalized): Secondary | ICD-10-CM | POA: Diagnosis not present

## 2020-08-11 DIAGNOSIS — M62838 Other muscle spasm: Secondary | ICD-10-CM | POA: Diagnosis not present

## 2020-08-11 DIAGNOSIS — R3915 Urgency of urination: Secondary | ICD-10-CM | POA: Diagnosis not present

## 2020-09-01 DIAGNOSIS — N3020 Other chronic cystitis without hematuria: Secondary | ICD-10-CM | POA: Diagnosis not present

## 2020-09-01 DIAGNOSIS — M6289 Other specified disorders of muscle: Secondary | ICD-10-CM | POA: Diagnosis not present

## 2020-09-01 DIAGNOSIS — M6281 Muscle weakness (generalized): Secondary | ICD-10-CM | POA: Diagnosis not present

## 2020-09-01 DIAGNOSIS — R3915 Urgency of urination: Secondary | ICD-10-CM | POA: Diagnosis not present

## 2020-09-01 DIAGNOSIS — N3941 Urge incontinence: Secondary | ICD-10-CM | POA: Diagnosis not present

## 2020-09-01 DIAGNOSIS — M62838 Other muscle spasm: Secondary | ICD-10-CM | POA: Diagnosis not present

## 2020-09-04 HISTORY — PX: HERNIA REPAIR: SHX51

## 2020-09-08 ENCOUNTER — Other Ambulatory Visit: Payer: Self-pay | Admitting: Family Medicine

## 2020-09-09 ENCOUNTER — Other Ambulatory Visit: Payer: Medicare Other

## 2020-09-18 IMAGING — RF DG SWALLOWING FUNCTION
10 series · 12 of 24 positions shown · non-contrast
Comparison: 12/15/2015 modified barium swallow study.

CLINICAL DATA: Parkinson disease.  Dysphagia.

EXAM:
MODIFIED BARIUM SWALLOW
TECHNIQUE: Different consistencies of barium were administered orally to the
patient by the Speech Pathologist. Imaging of the pharynx was
performed in the lateral projection.
FLUOROSCOPY TIME:  Fluoroscopy Time:  0 minutes 48 seconds
Number of Acquired Spot Images: 0

[Series 1: run · 1 of 24 frames shown (1 of 10)]
[frame 13/24]
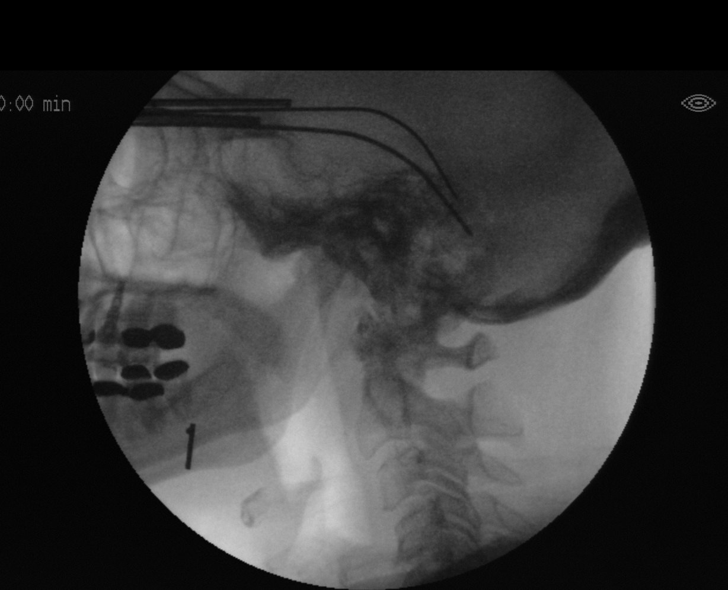

[Series 2: run · 1 of 14 frames shown (2 of 10)]
[frame 8/14]
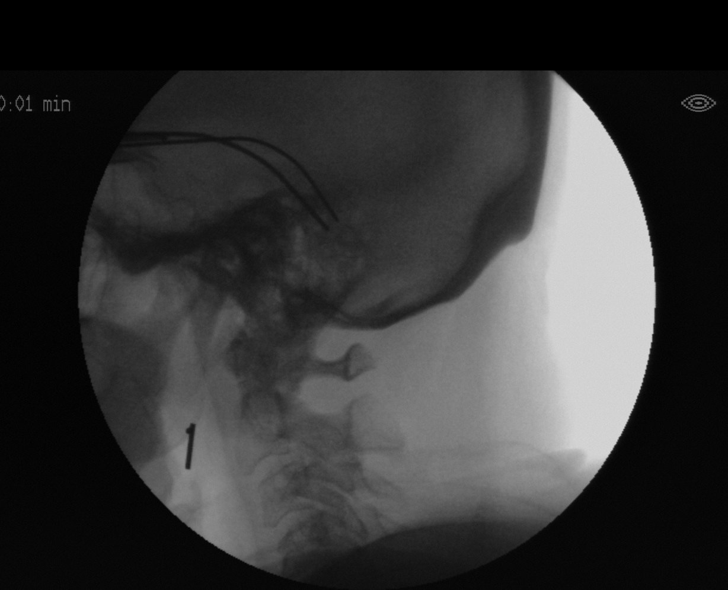

[Series 3: run · 1 of 49 frames shown (3 of 10)]
[frame 25/49]
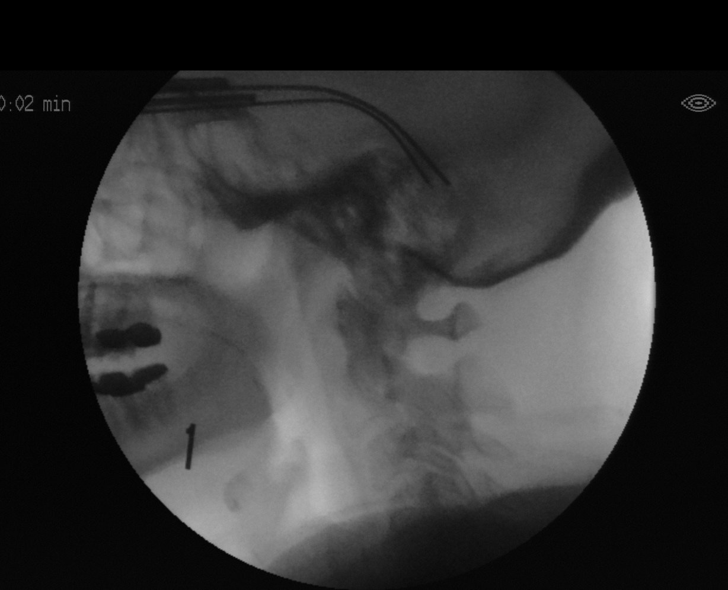

[Series 4: run · 1 of 119 frames shown (4 of 10)]
[frame 60/119]
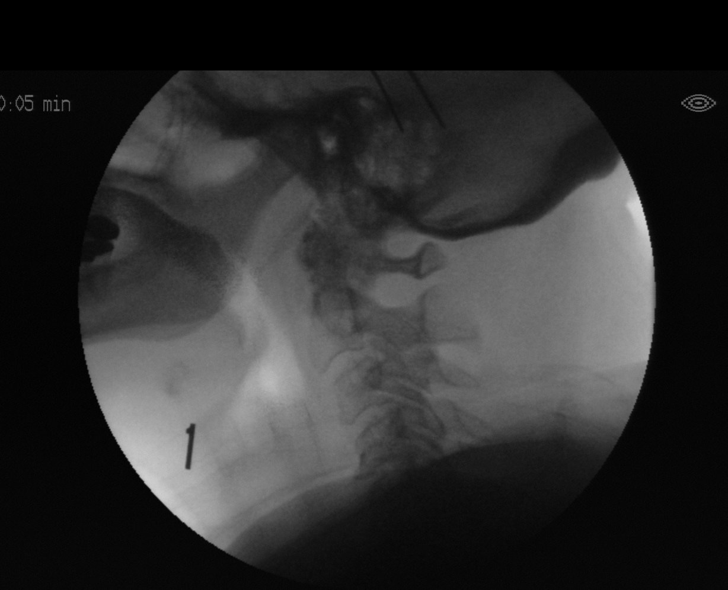

[Series 5: run · 2 of 194 frames shown (5 of 10)]
[frame 10/194]
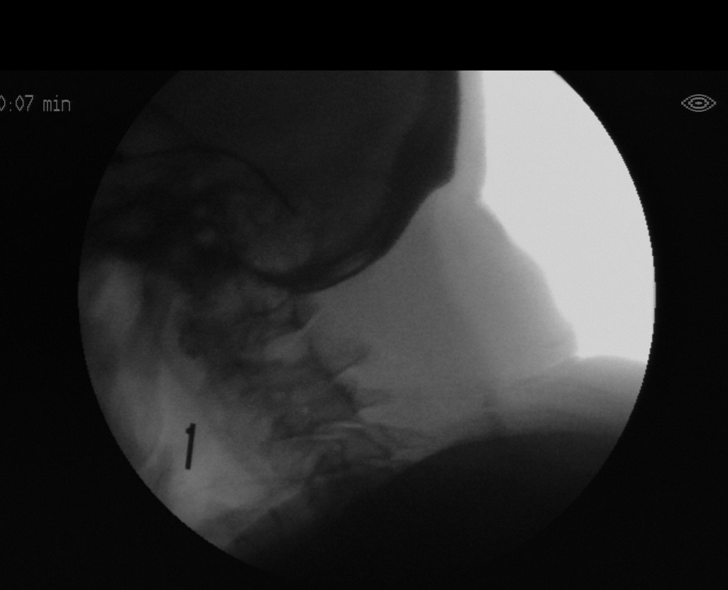
[frame 165/194]
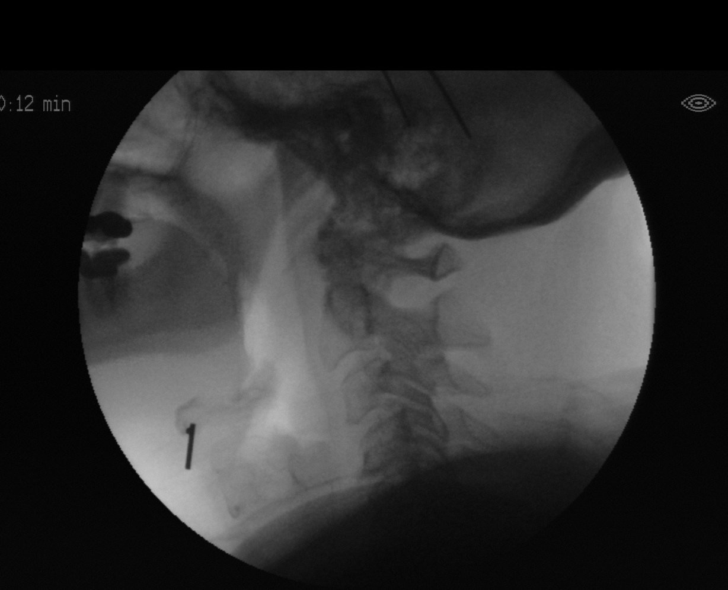

[Series 6: run · 1 of 253 frames shown (6 of 10)]
[frame 216/253]
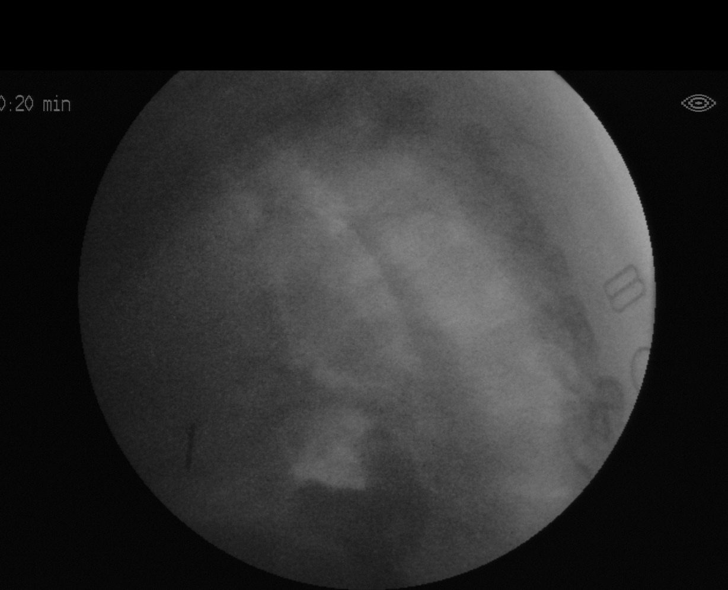

[Series 7: run · 1 of 81 frames shown (7 of 10)]
[frame 41/81]
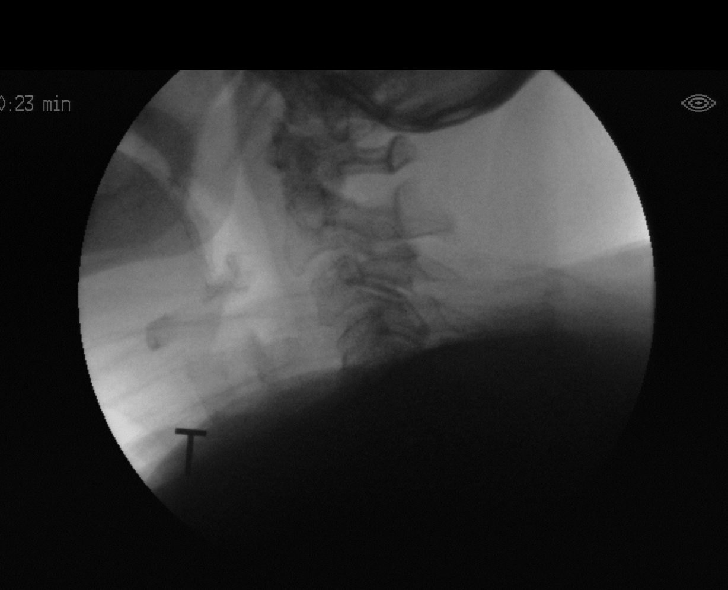

[Series 8: run · 1 of 348 frames shown (8 of 10)]
[frame 175/348]
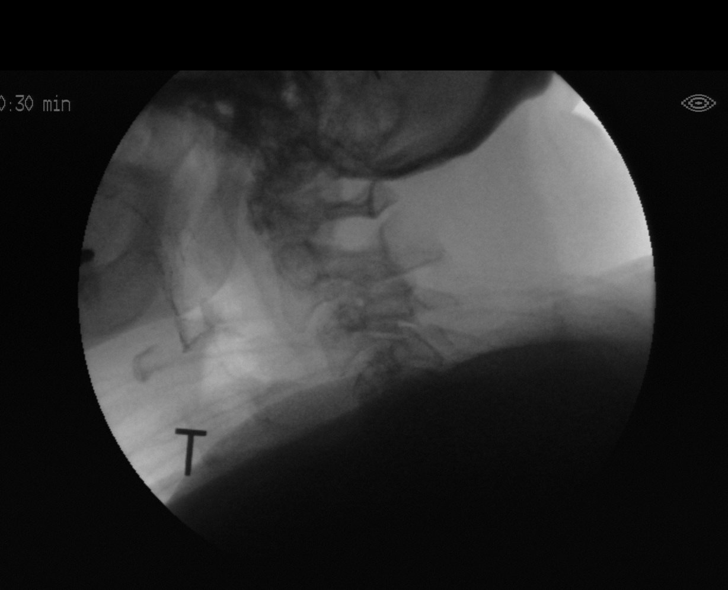

[Series 9: run · 1 of 104 frames shown (9 of 10)]
[frame 1/104]
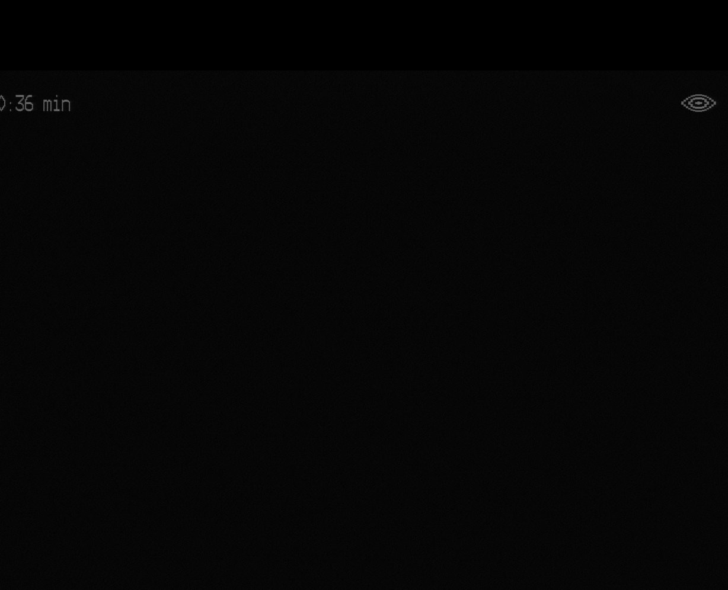

[Series 10: run · 2 of 271 frames shown (10 of 10)]
[frame 41/271]
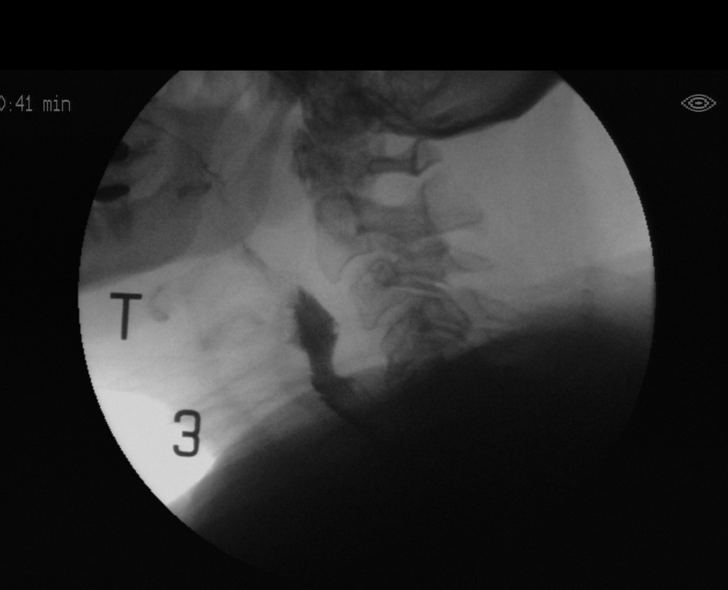
[frame 231/271]
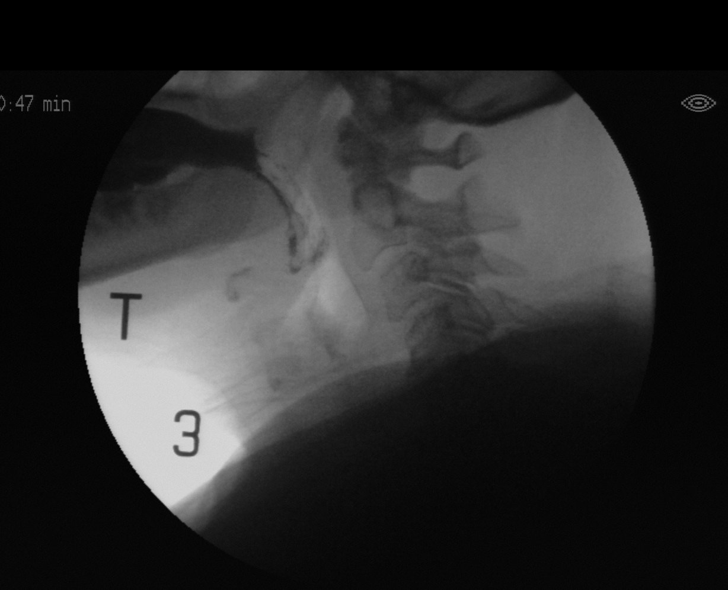

[12 of 24 positions shown; findings below may reference images not displayed]

FINDINGS: Mild cricopharyngeus muscle dysfunction is observed, characterized
by delayed and incomplete opening of the cricopharyngeus muscle.
Otherwise normal swallowing function with all barium consistencies,
with no evidence of laryngeal penetration or tracheobronchial
aspiration.
IMPRESSION: Mild cricopharyngeus muscle dysfunction. Otherwise normal oral and
pharyngeal phases of swallowing.

Please refer to the Speech Pathologists report for complete details
and recommendations.

## 2020-10-06 ENCOUNTER — Emergency Department (HOSPITAL_COMMUNITY)
Admission: EM | Admit: 2020-10-06 | Discharge: 2020-10-07 | Disposition: A | Discharge status: Home / Self Care | Payer: Medicare Other | Attending: Emergency Medicine | Admitting: Emergency Medicine

## 2020-10-06 ENCOUNTER — Other Ambulatory Visit: Payer: Self-pay

## 2020-10-06 ENCOUNTER — Emergency Department (HOSPITAL_COMMUNITY): Payer: Medicare Other

## 2020-10-06 ENCOUNTER — Telehealth: Payer: Self-pay | Admitting: Internal Medicine

## 2020-10-06 ENCOUNTER — Encounter (HOSPITAL_COMMUNITY): Payer: Self-pay

## 2020-10-06 DIAGNOSIS — G20 Parkinson's disease: Secondary | ICD-10-CM | POA: Diagnosis not present

## 2020-10-06 DIAGNOSIS — I110 Hypertensive heart disease with heart failure: Secondary | ICD-10-CM | POA: Insufficient documentation

## 2020-10-06 DIAGNOSIS — M6289 Other specified disorders of muscle: Secondary | ICD-10-CM | POA: Diagnosis not present

## 2020-10-06 DIAGNOSIS — M6281 Muscle weakness (generalized): Secondary | ICD-10-CM | POA: Diagnosis not present

## 2020-10-06 DIAGNOSIS — Z96641 Presence of right artificial hip joint: Secondary | ICD-10-CM | POA: Insufficient documentation

## 2020-10-06 DIAGNOSIS — I5030 Unspecified diastolic (congestive) heart failure: Secondary | ICD-10-CM | POA: Diagnosis not present

## 2020-10-06 DIAGNOSIS — K219 Gastro-esophageal reflux disease without esophagitis: Secondary | ICD-10-CM | POA: Diagnosis not present

## 2020-10-06 DIAGNOSIS — Z79899 Other long term (current) drug therapy: Secondary | ICD-10-CM | POA: Diagnosis not present

## 2020-10-06 DIAGNOSIS — R3915 Urgency of urination: Secondary | ICD-10-CM | POA: Diagnosis not present

## 2020-10-06 DIAGNOSIS — I2510 Atherosclerotic heart disease of native coronary artery without angina pectoris: Secondary | ICD-10-CM | POA: Insufficient documentation

## 2020-10-06 DIAGNOSIS — M62838 Other muscle spasm: Secondary | ICD-10-CM | POA: Diagnosis not present

## 2020-10-06 DIAGNOSIS — N3941 Urge incontinence: Secondary | ICD-10-CM | POA: Diagnosis not present

## 2020-10-06 DIAGNOSIS — R1013 Epigastric pain: Secondary | ICD-10-CM | POA: Insufficient documentation

## 2020-10-06 DIAGNOSIS — N3020 Other chronic cystitis without hematuria: Secondary | ICD-10-CM | POA: Diagnosis not present

## 2020-10-06 DIAGNOSIS — R109 Unspecified abdominal pain: Secondary | ICD-10-CM | POA: Diagnosis not present

## 2020-10-06 LAB — CBC WITH DIFFERENTIAL/PLATELET
Abs Immature Granulocytes: 0.01 10*3/uL (ref 0.00–0.07)
Basophils Absolute: 0 10*3/uL (ref 0.0–0.1)
Basophils Relative: 0 %
Eosinophils Absolute: 0 10*3/uL (ref 0.0–0.5)
Eosinophils Relative: 0 %
HCT: 41.5 % (ref 36.0–46.0)
Hemoglobin: 13.6 g/dL (ref 12.0–15.0)
Immature Granulocytes: 0 %
Lymphocytes Relative: 13 %
Lymphs Abs: 1 10*3/uL (ref 0.7–4.0)
MCH: 30.4 pg (ref 26.0–34.0)
MCHC: 32.8 g/dL (ref 30.0–36.0)
MCV: 92.6 fL (ref 80.0–100.0)
Monocytes Absolute: 0.5 10*3/uL (ref 0.1–1.0)
Monocytes Relative: 6 %
Neutro Abs: 6.3 10*3/uL (ref 1.7–7.7)
Neutrophils Relative %: 81 %
Platelets: 216 10*3/uL (ref 150–400)
RBC: 4.48 MIL/uL (ref 3.87–5.11)
RDW: 14.6 % (ref 11.5–15.5)
WBC: 7.9 10*3/uL (ref 4.0–10.5)
nRBC: 0 % (ref 0.0–0.2)

## 2020-10-06 LAB — URINALYSIS, ROUTINE W REFLEX MICROSCOPIC
Bilirubin Urine: NEGATIVE
Glucose, UA: NEGATIVE mg/dL
Ketones, ur: 20 mg/dL — AB
Leukocytes,Ua: NEGATIVE
Nitrite: NEGATIVE
Protein, ur: 30 mg/dL — AB
Specific Gravity, Urine: 1.029 (ref 1.005–1.030)
pH: 5 (ref 5.0–8.0)

## 2020-10-06 LAB — COMPREHENSIVE METABOLIC PANEL
ALT: 19 U/L (ref 0–44)
AST: 24 U/L (ref 15–41)
Albumin: 4.6 g/dL (ref 3.5–5.0)
Alkaline Phosphatase: 71 U/L (ref 38–126)
Anion gap: 12 (ref 5–15)
BUN: 18 mg/dL (ref 8–23)
CO2: 26 mmol/L (ref 22–32)
Calcium: 9.7 mg/dL (ref 8.9–10.3)
Chloride: 104 mmol/L (ref 98–111)
Creatinine, Ser: 0.93 mg/dL (ref 0.44–1.00)
GFR, Estimated: >60 mL/min (ref >60)
Glucose, Bld: 127 mg/dL — ABNORMAL HIGH (ref 70–99)
Potassium: 3.7 mmol/L (ref 3.5–5.1)
Sodium: 142 mmol/L (ref 135–145)
Total Bilirubin: 1 mg/dL (ref 0.3–1.2)
Total Protein: 7.9 g/dL (ref 6.5–8.1)

## 2020-10-06 LAB — LIPASE, BLOOD: Lipase: 76 U/L — ABNORMAL HIGH (ref 11–51)

## 2020-10-06 LAB — TROPONIN I (HIGH SENSITIVITY): Troponin I (High Sensitivity): 4 ng/L (ref <18)

## 2020-10-06 MED ORDER — ONDANSETRON HCL 4 MG/2ML IJ SOLN
4.0000 mg | Freq: Once | INTRAMUSCULAR | Status: AC
  Administered 2020-10-06: 4 mg via INTRAVENOUS
  Filled 2020-10-06: qty 2

## 2020-10-06 MED ORDER — FAMOTIDINE IN NACL 20-0.9 MG/50ML-% IV SOLN
20.0000 mg | Freq: Once | INTRAVENOUS | Status: AC
  Administered 2020-10-06: 20 mg via INTRAVENOUS
  Filled 2020-10-06: qty 50

## 2020-10-06 MED ORDER — MORPHINE SULFATE (PF) 4 MG/ML IV SOLN
4.0000 mg | Freq: Once | INTRAVENOUS | Status: AC
Start: 2020-10-06 — End: 2020-10-06
  Administered 2020-10-06: 4 mg via INTRAVENOUS
  Filled 2020-10-06: qty 1

## 2020-10-06 MED ORDER — SODIUM CHLORIDE 0.9 % IV BOLUS
500.0000 mL | Freq: Once | INTRAVENOUS | Status: AC
  Administered 2020-10-06: 500 mL via INTRAVENOUS

## 2020-10-06 NOTE — Telephone Encounter (Signed)
Patient called me tonight around 8:30 PM She states that around 10 AM she developed acute substernal chest discomfort which has been unremitting and rather progressive over the last approximately 10 hours. Discomfort is burning in nature She was able to eat breakfast this morning including coffee and a sweet roll.  Since then she has had nothing else to eat. She is able to drink and tolerate her secretions.  She has been able to take medications.  She has had several episodes of emesis throughout the day.  She tried Maalox without benefit. She is not having shortness of breath. She took her omeprazole this morning and later in the day took a famotidine which she was able to retain.  This is not improved her pain.  She does not feel like food is stuck in her chest nor does she feel as if water does not reach her stomach.  Again, she can tolerate her secretions.  She describes 3 or 4 similar episodes with similar pain but never lasting this long.  She does not exactly recall her prior episodes abated but seem to "just stop"  Chart reviewed.  She is known to Dr. Fuller Plan. She had an EGD in November 2021 which revealed a distal esophageal stricture, a large 8 cm hiatal hernia with Cameron's erosions.  Savary dilation over a guidewire was performed to 14 mm at that time.  Given unremitting pain and no improvement with antacids.  I recommended that she be evaluated in the emergency department.  She voices understanding.  Time provided for questions and answers and she thanked me for the call.  I will have our office check on her in the morning and alert Dr. Fuller Plan

## 2020-10-06 NOTE — ED Triage Notes (Signed)
Pt has a history of a hernia and a narrowed esophagus, she states that this happens a few times a year, today she ate a pastry and coffee and she started to hurt in her epigastric area Pt states that she last had her esophagus stretched in October

## 2020-10-06 NOTE — ED Provider Notes (Signed)
[facility] DEPT Provider Note   CSN: [id number] Arrival date & time: 10/06/20  2117     History Chief Complaint  Patient presents with  . Abdominal Pain    Kristin [name] is a 70 y.o. female with a hx of RA on plaquenil, GERD, hypertension, hyperlipidemia, parkison disease, & prior esophageal stricture who presents to the ED with complaints of epigastric pain since 10:30 AM. Patient states that this morning shortly after her cup of coffee she developed sharp epigastric pain which has been constant since onset, worse when trying to drink, no alleviating factors. Has a little pain into the chest as well. Having associated nausea with 12 episodes of emesis, but does not feel like liquid is getting stuck when she tries to drink. Hx of similar pain, feels like her GERD, but has not persisted as long. Also having some constipation, very small BM This AM, passing gas. Denies fever, chills, hematemesis, melena, hematochezia, diaphoresis or dyspnea.     HPI     Past Medical History:  Diagnosis Date  . Abnormal vaginal Pap smear   . Acute pharyngitis 02/25/2013  . Anemia   . Anxiety   . Arthritis   . 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 Chicken pox as a child  . Chronic UTI    sees dr Terance Hart  . Constipation 12/07/2015  . Depression with anxiety 11/02/2009   Qualifier: Diagnosis of  By: [name], LPN, [name]   . Dermatitis 07/17/2012  . Esophageal stricture 1994  . Fibroids   . Foot pain, bilateral 06/19/2012  . GERD (gastroesophageal reflux disease)   . GERD (gastroesophageal reflux disease)   . Hiatal hernia   . Hyperglycemia 08/19/2013  . 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 Hyperlipidemia   . Hypertension   . Infertility, female   . Low back pain 10/17/2007   Qualifier: Diagnosis of  By: [name], [name]   . Measles as a child  . Obesity   . Osteoarthritis   . Parkinson disease ([place])   . Plantar fasciitis of left foot  06/19/2012  . Preventative health care 12/19/2015  . Rheumatoid arthritis ([place]) 01/08/2018  . Rosacea 10/05/2014  . Swallowing difficulty   . Urinary frequency 02/25/2013  . Visual floaters 05/11/2014    Patient Active Problem List   Diagnosis Date Noted  . Anemia 03/16/2020  . Coronary artery disease involving native coronary artery of native heart without angina pectoris 06/12/2019  . Abnormal stress test   . Diastolic dysfunction 07/01/2535  . Cough 05/10/2018  . Rheumatoid arthritis ([place]) 01/08/2018  . Hand pain, right 10/15/2017  . Vitamin D deficiency 12/20/2016  . Prediabetes 12/20/2016  . Shortness of breath on exertion 11/27/2016  . Preventative health care 12/19/2015  . Constipation 12/07/2015  . BCC (basal cell carcinoma of skin) 10/05/2014  . Rosacea 10/05/2014  . Parkinson's disease ([place]) 05/11/2014  . Visual floaters 05/11/2014  . Paralysis agitans ([place]) 01/08/2014  . Screening for cervical cancer 07/17/2012  . Foot pain, bilateral 06/19/2012  . 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 Obesity 11/12/2009  . Depression with anxiety 11/02/2009  . Urinary tract infection 12/03/2008  . 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 Lumbar back pain with radiculopathy affecting lower extremity 10/17/2007  . Essential hypertension 07/05/2007  . Osteoarthritis 07/05/2007  . Hyperlipidemia 06/26/2007  . H/O: iron deficiency anemia 05/08/2007  . Hiatal hernia with GERD 05/08/2007    Past Surgical History:  Procedure  Laterality Date  . ABDOMINAL HYSTERECTOMY  2006   total  . esophageal     stretching  . laporoscopy    . LEFT HEART CATH AND CORONARY ANGIOGRAPHY N/A 06/02/2019   Procedure: LEFT HEART CATH AND CORONARY ANGIOGRAPHY;  Surgeon: Jettie Booze, MD;  Location: [facility];  Service: Cardiovascular;  Laterality: N/A;  . TONSILLECTOMY    . TOTAL HIP ARTHROPLASTY Right 2010  . wisdom teeth extracted       OB History    Gravida  1   Para  1    Term  1   Preterm      AB      Living  1     SAB      IAB      Ectopic      Multiple      Live Births              Family History  Problem Relation Age of Onset  . Cancer Mother        breast  . Other Mother        arrythmia  . Mental illness Mother        bipolar  . Hyperlipidemia Mother   . Thyroid disease Mother   . Depression Mother   . Bipolar disorder Mother   . Heart disease Father   . Arthritis Father        rheumatoid  . Hypertension Father   . Hyperlipidemia Father   . Depression Sister   . Mental illness Sister        bipolar  . Parkinson's disease Sister   . Arthritis Sister   . Arthritis Sister   . Arthritis Sister   . Colon cancer Neg Hx   . Esophageal cancer Neg Hx   . Rectal cancer Neg Hx   . Stomach cancer Neg Hx     Social History   Tobacco Use  . Smoking status: Never Smoker  . Smokeless tobacco: Never Used  Vaping Use  . Vaping Use: Never used  Substance Use Topics  . Alcohol use: Yes    Alcohol/week: 4.0 standard drinks    Types: 4 Glasses of wine per week    Comment: occasionally  . Drug use: No    Home Medications Prior to Admission medications   Medication Sig Start Date End Date Taking? Authorizing Provider  B-D TB SYRINGE 1CC/27GX1/2" 27G X 1/2" 1 ML MISC  04/01/18   [provider]  carbidopa-levodopa (SINEMET CR) 50-200 MG tablet Take 1 tablet by mouth at bedtime. 04/22/20   Tat, Eustace Quail, DO  carbidopa-levodopa (SINEMET IR) 25-100 MG tablet 2 at 7am, 2 at 11am, 1 at 4pm 04/22/20   Tat, Rebecca S, DO  carvedilol (COREG) 3.125 MG tablet TAKE 1 TABLET BY MOUTH TWICE A DAY WITH MEALS 06/15/20   Mosie Lukes, MD  Cholecalciferol (VITAMIN D3) 50 MCG (2000 UT) TABS Take 1 capsule by mouth daily. Take daily everyday except Thursdays    [provider]  DULoxetine (CYMBALTA) 30 MG capsule Take 1 capsule (30 mg total) by mouth daily. 08/03/20   Mosie Lukes, MD  escitalopram (LEXAPRO) 20 MG tablet  Take 1 tablet (20 mg total) by mouth daily. 08/03/20   Mosie Lukes, MD  famotidine (PEPCID) 40 MG tablet TAKE 1 TABLET BY MOUTH EVERYDAY AT BEDTIME 05/17/20   Mosie Lukes, MD  Ferrous Fumarate-Folic Acid (HEMATINIC/FOLIC ACID) 99991111 MG TABS Take 1 tablet  by mouth daily. 09/08/20   Mosie Lukes, MD  folic acid (FOLVITE) 1 MG tablet Take 1 mg by mouth 3 (three) times daily.  03/21/18   [provider]  furosemide (LASIX) 20 MG tablet Take 1 tablet (20 mg total) by mouth daily. On Tuesdays and Satudays only 06/12/19 06/07/20  Tobb, Kardie, DO  hydroxychloroquine (PLAQUENIL) 200 MG tablet Take 2 tablets (400 mg total) by mouth daily. 04/21/19   Mosie Lukes, MD  losartan (COZAAR) 50 MG tablet TAKE 1 TABLET BY MOUTH EVERY DAY 03/18/20   Mosie Lukes, MD  methotrexate 50 MG/2ML injection Inject 20 mg into the skin once a week. 04/01/20   [provider]  omeprazole (PRILOSEC) 40 MG capsule Take 1 capsule (40 mg total) by mouth daily. 06/07/20   Ladene Artist, MD  pramipexole (MIRAPEX) 0.5 MG tablet Take 1 tablet (0.5 mg total) by mouth 3 (three) times daily. 04/22/20   Tat, Eustace Quail, DO  rosuvastatin (CRESTOR) 10 MG tablet TAKE 1 TABLET BY MOUTH ON MONDAYS, [facility] AND FRIDAYS 02/18/20   Mosie Lukes, MD  Vitamin D, Ergocalciferol, (DRISDOL) 1.25 MG (50000 UNIT) CAPS capsule TAKE 1 CAPSULE (50,000 UNITS TOTAL) BY MOUTH EVERY 7 (SEVEN) DAYS. 06/21/20   Mosie Lukes, MD  albuterol (VENTOLIN HFA) 108 (90 Base) MCG/ACT inhaler Inhale 2 puffs into the lungs every 6 (six) hours as needed for wheezing or shortness of breath. Patient not taking: Reported on 06/07/2020 04/21/19 06/14/20  Mosie Lukes, MD    Allergies    Bactrim [sulfamethoxazole-trimethoprim] and Penicillins  Review of Systems   Review of Systems  Constitutional: Negative for chills and fever.  Respiratory: Negative for shortness of breath.   Cardiovascular: Positive for chest pain.  Gastrointestinal:  Positive for abdominal pain, constipation, nausea and vomiting. Negative for anal bleeding, blood in stool and diarrhea.  Genitourinary: Negative for dysuria.  Neurological: Negative for syncope.  All other systems reviewed and are negative.   Physical Exam Updated Vital Signs BP (!) 166/95 (BP Location: Left Arm)   Pulse 77   Temp 98.1 F (36.7 C) (Oral)   Resp 18   Ht 5\' 4"  (1.626 m)   Wt 104.3 kg   SpO2 98%   BMI 39.48 kg/m   Physical Exam Vitals and nursing note reviewed.  Constitutional:      General: She is not in acute distress.    Appearance: She is well-developed. She is not toxic-appearing.  HENT:     Head: Normocephalic and atraumatic.  Eyes:     General:        Right eye: No discharge.        Left eye: No discharge.     Conjunctiva/sclera: Conjunctivae normal.  Cardiovascular:     Rate and Rhythm: Normal rate and regular rhythm.     Comments: 2+ symmetric radial pulses.  Pulmonary:     Effort: Pulmonary effort is normal. No respiratory distress.     Breath sounds: Normal breath sounds. No wheezing, rhonchi or rales.  Chest:     Chest wall: Tenderness (mild lower anterior chest wall) present.  Abdominal:     General: There is no distension.     Palpations: Abdomen is soft.     Tenderness: There is abdominal tenderness in the epigastric area. There is no guarding or rebound.  Musculoskeletal:     Cervical back: Neck supple.  Skin:    General: Skin is warm and dry.  Findings: No rash.  Neurological:     Mental Status: She is alert.     Comments: Clear speech.   Psychiatric:        Behavior: Behavior normal.     ED Results / Procedures / Treatments   Labs (all labs ordered are listed, but only abnormal results are displayed) Labs Reviewed  COMPREHENSIVE METABOLIC PANEL - Abnormal; Notable for the following components:      Result Value   Glucose, Bld 127 (*)    All other components within normal limits  LIPASE, BLOOD - Abnormal; Notable for  the following components:   Lipase 76 (*)    All other components within normal limits  URINALYSIS, ROUTINE W REFLEX MICROSCOPIC - Abnormal; Notable for the following components:   Hgb urine dipstick SMALL (*)    Ketones, ur 20 (*)    Protein, ur 30 (*)    Bacteria, UA RARE (*)    All other components within normal limits  CBC WITH DIFFERENTIAL/PLATELET  TROPONIN I (HIGH SENSITIVITY)  TROPONIN I (HIGH SENSITIVITY)    EKG EKG Interpretation  Date/Time:  Wednesday October 06 2020 21:25:15 EST Ventricular Rate:  71 PR Interval:    QRS Duration: 95 QT Interval:  436 QTC Calculation: 474 R Axis:   -41 Text Interpretation: Sinus rhythm Left anterior fascicular block Confirmed by Randal Buba, April ([id number]) on 10/07/2020 12:03:03 AM   Radiology DG Abdomen Acute W/Chest  Result Date: 10/06/2020 CLINICAL DATA:  Epigastric pain. EXAM: DG ABDOMEN ACUTE WITH 1 VIEW CHEST COMPARISON:  Radiograph 01/09/2017, barium swallow 01/20/2020 FINDINGS: Left retrocardiac hiatal hernia with air-fluid level, increased in size compared to 2018 radiograph. Heart size appears normal. No focal airspace disease. No pleural fluid or pneumothorax. No free intra-abdominal air. No bowel dilatation to suggest obstruction. Moderate stool in the ascending, descending and proximal sigmoid colon. No abnormal rectal distention. Unchanged right pelvic calcification consistent with phleboliths. No radiopaque calculi. Degenerative change in the lumbar spine. Right hip arthroplasty. No acute osseous abnormalities are seen. IMPRESSION: 1. Moderately large left retrocardiac hiatal hernia. This is a paraesophageal hernia on prior modified barium swallow. 2. No small bowel obstruction.  Moderate colonic stool burden. Electronically Signed   By: Keith Rake M.D.   On: 10/06/2020 23:36    Procedures Procedures   Medications Ordered in ED Medications  sodium chloride 0.9 % bolus 500 mL (500 mLs Intravenous New Bag/Given 10/06/20  2300)  ondansetron (ZOFRAN) injection 4 mg (4 mg Intravenous Given 10/06/20 2328)  morphine 4 MG/ML injection 4 mg (4 mg Intravenous Given 10/06/20 2328)  famotidine (PEPCID) IVPB 20 mg premix (0 mg Intravenous Stopped 10/06/20 2352)  alum & mag hydroxide-simeth (MAALOX/MYLANTA) 200-200-20 MG/5ML suspension 30 mL (30 mLs Oral Given 10/07/20 0014)    And  lidocaine (XYLOCAINE) 2 % viscous mouth solution 15 mL (15 mLs Oral Given 10/07/20 0014)    ED Course  I have reviewed the triage vital signs and the nursing notes.  Pertinent labs & imaging results that were available during my care of the patient were reviewed by me and considered in my medical decision making (see chart for details).    MDM Rules/Calculators/A&P                         Patient presents to the ED with complaints of epigastric pain since this AM.  Nontoxic, vitals w/ somewhat elevated BP. Exam w/ epigastric tenderness w/o peritoneal signs.   Additional history obtained:  Additional history obtained from chat review & nursing note review.  Cardiac cath 05/2019- no significant CAD.   EKG: Sinus rhythm Left anterior fascicular block  Lab Tests:  I Ordered, reviewed, and interpreted labs, which included:  CBC: No significant anemia or leukocytosis.  CMP: No significant electrolyte derangement. LFTs WNL Lipase: minimal elevation Troponins: No significant elevation.  UA: No UTI.   Imaging Studies ordered:  I ordered imaging studies which included Chest/abdominal x-ray, I independently visualized and interpreted imaging which showed 1. Moderately large left retrocardiac hiatal hernia. This is a paraesophageal hernia on prior modified barium swallow. 2. No small bowel obstruction.  Moderate colonic stool burden.  ED Course:  EKG without findings consistent with STEMI, recent reassuring heart catheterization in 2020, troponins flat- doubt ACS. Low risk wells- doubt PE. No widened mediastinum on CXR, pain no tearing or radiating to  the back, symmetric pulses- doubt dissection. No pneumonia or PTX on CXR. Labs overall reassuring. Abdominal exam w/o peritoneal signs initially or on repeat. Patient w/ hx of similar pain, relays it feels like her prior GERD, she has a notable hiatal hernia, & her sxs seem consistent w/ this as well, I do not suspect acute surgical processes. Tolerating PO. Will change omeprazole to protonix & change bowel regiment with PCP/GI follow up. I discussed results, treatment plan, need for follow-up, and return precautions with the patient. Provided opportunity for questions, patient confirmed understanding and is in agreement with plan.   This is a shared visit with supervising physician Dr. Randal Buba who has independently evaluated patient & provided guidance in evaluation/management/disposition, in agreement with care    Portions of this note were generated with Dragon dictation software. Dictation errors may occur despite best attempts at proofreading.  Final Clinical Impression(s) / ED Diagnoses Final diagnoses:  Epigastric abdominal pain    Rx / DC Orders ED Discharge Orders         Ordered    pantoprazole (PROTONIX) 40 MG tablet  Daily        10/07/20 0202    sucralfate (CARAFATE) 1 GM/10ML suspension  3 times daily with meals & bedtime        10/07/20 0202    glycerin adult 2 g suppository  As needed        10/07/20 0202    polyethylene glycol (MIRALAX) 17 g packet  2 times daily        10/07/20 0202           Leafy Kindle 10/07/20 0203    Palumbo, April, MD 10/07/20 [id number]

## 2020-10-07 ENCOUNTER — Other Ambulatory Visit: Payer: Self-pay

## 2020-10-07 ENCOUNTER — Ambulatory Visit (INDEPENDENT_AMBULATORY_CARE_PROVIDER_SITE_OTHER): Payer: Medicare Other | Admitting: Nurse Practitioner

## 2020-10-07 ENCOUNTER — Telehealth: Payer: Self-pay | Admitting: Gastroenterology

## 2020-10-07 ENCOUNTER — Encounter (HOSPITAL_COMMUNITY): Payer: Self-pay | Admitting: Emergency Medicine

## 2020-10-07 ENCOUNTER — Encounter: Payer: Self-pay | Admitting: Nurse Practitioner

## 2020-10-07 ENCOUNTER — Telehealth: Payer: Self-pay | Admitting: Nurse Practitioner

## 2020-10-07 VITALS — BP 137/70 | HR 84 | Ht 64.0 in | Wt 238.6 lb

## 2020-10-07 DIAGNOSIS — R112 Nausea with vomiting, unspecified: Secondary | ICD-10-CM | POA: Diagnosis not present

## 2020-10-07 DIAGNOSIS — R1013 Epigastric pain: Secondary | ICD-10-CM

## 2020-10-07 DIAGNOSIS — R748 Abnormal levels of other serum enzymes: Secondary | ICD-10-CM | POA: Diagnosis not present

## 2020-10-07 DIAGNOSIS — K219 Gastro-esophageal reflux disease without esophagitis: Secondary | ICD-10-CM | POA: Diagnosis not present

## 2020-10-07 DIAGNOSIS — K5909 Other constipation: Secondary | ICD-10-CM

## 2020-10-07 LAB — TROPONIN I (HIGH SENSITIVITY): Troponin I (High Sensitivity): 9 ng/L (ref <18)

## 2020-10-07 MED ORDER — PANTOPRAZOLE SODIUM 40 MG PO TBEC: 40.0000 mg | DELAYED_RELEASE_TABLET | Freq: Every day | ORAL | 0 refills | Status: DC

## 2020-10-07 MED ORDER — POLYETHYLENE GLYCOL 3350 17 G PO PACK: 17.0000 g | PACK | Freq: Two times a day (BID) | ORAL | 0 refills | Status: DC

## 2020-10-07 MED ORDER — FAMOTIDINE 40 MG PO TABS: ORAL_TABLET | ORAL | 1 refills | Status: DC

## 2020-10-07 MED ORDER — OMEPRAZOLE 40 MG PO CPDR: 40.0000 mg | DELAYED_RELEASE_CAPSULE | Freq: Every day | ORAL | 3 refills | Status: DC

## 2020-10-07 MED ORDER — SUCRALFATE 1 GM/10ML PO SUSP: 1.0000 g | Freq: Three times a day (TID) | ORAL | 0 refills | Status: DC

## 2020-10-07 MED ORDER — GLYCERIN (ADULT) 2 G RE SUPP: 1.0000 | RECTAL | 0 refills | Status: DC | PRN

## 2020-10-07 MED ORDER — ALUM & MAG HYDROXIDE-SIMETH 200-200-20 MG/5ML PO SUSP
30.0000 mL | Freq: Once | ORAL | Status: AC
  Administered 2020-10-07: 30 mL via ORAL
  Filled 2020-10-07: qty 30

## 2020-10-07 MED ORDER — SUCRALFATE 1 G PO TABS
1.0000 g | ORAL_TABLET | Freq: Four times a day (QID) | ORAL | 3 refills | Status: DC | PRN
Start: 2020-10-07 — End: 2020-12-07

## 2020-10-07 MED ORDER — LIDOCAINE VISCOUS HCL 2 % MT SOLN
15.0000 mL | Freq: Once | OROMUCOSAL | Status: AC
  Administered 2020-10-07: 15 mL via ORAL
  Filled 2020-10-07: qty 15

## 2020-10-07 NOTE — Progress Notes (Signed)
Reviewed and agree with management plan. Given her symptoms and mildly elevated lipase would proceed with CT AP. If CT AP is not diagnotic consider UGI series to further evaluate her large HH.    Pricilla Riffle. Fuller Plan, MD FACG [phone]

## 2020-10-07 NOTE — Patient Instructions (Signed)
If you are age 71 or older, your body mass index should be between 23-30. Your Body mass index is 40.96 kg/m. If this is out of the aforementioned range listed, please consider follow up with your Primary Care Provider.  We have sent the following medications to your pharmacy for you to pick up at your convenience: Omeprazole, Pepcid.   DO NOT FILL RX for Carafate or Protonix.   Resume Fiber supplement every other day with Miralax every other day.   Nevin Bloodgood will call with possible referral to Surgeon for Hiatal Hernia.   Thank you for choosing me and [facility].  Tye Savoy NP

## 2020-10-07 NOTE — Discharge Instructions (Addendum)
You were seen in the ER today for abdominal/chest pain. Your work-up was overall reassuring. Your labs looked similar to prior. Your lipase was very mildly elevated- please have this rechecked by your primary care provider. Your x-ray showed your hiatal hernia and a moderate amount of stool in your colon.   We are sending you home with the following medications/changes:   Stop omeprazole Start pantoprazole/protonix- take this daily in the morning prior to meals Start carafate- take daily prior to each meal and prior to bed time.  Stop Benafiber.  Take Miralax twice per day.  Start Glycerin- use 1 suppository once daily as needed for constipation.    We have prescribed you new medication(s) today. Discuss the medications prescribed today with your pharmacist as they can have adverse effects and interactions with your other medicines including over the counter and prescribed medications. Seek medical evaluation if you start to experience new or abnormal symptoms after taking one of these medicines, seek care immediately if you start to experience difficulty breathing, feeling of your throat closing, facial swelling, or rash as these could be indications of a more serious allergic reaction  Please follow attached diet guidelines.   Please follow up with Dr. Fuller Plan within 3 days.   Return to the ER for new or worsening symptoms including but not limited to worsened pain, new pain, trouble breathing, inability to keep fluids down, blood in vomit/stool, passing out, or any other concerns.

## 2020-10-07 NOTE — Telephone Encounter (Signed)
Patient was in the ED last night.  Per Dr. Fuller Plan patient needs eval this am for non-cardiac chest pain.  She will come in this am at 10:30

## 2020-10-07 NOTE — Telephone Encounter (Signed)
Please contact patient this morning. I don't see ED evaluation as recommended. Advise to contact her PCP to further assess her chest pain

## 2020-10-07 NOTE — Telephone Encounter (Signed)
Spoke with the patient. "Just woke up from a nap." Her temperature is 99.5 She has successfully swallowed an kept down 2 of her pills. She wanted you to know. Admits anxiety, but is going to take her HTN med as well.

## 2020-10-07 NOTE — Progress Notes (Signed)
CT AP 10/11/20 8:30 am [facility]

## 2020-10-07 NOTE — Telephone Encounter (Signed)
Patient and husband called tonight. They were just seen in the ED last night, in the office today. She continues to have pain, they inquire as to what she can take. She denies any nausea or vomiting right now. I reviewed her file. Labs done yesterday in the ED. She has a CT scan scheduled on Monday but does not think she can wait that long.  I advised her after hours if the pain is worsening / unbearable, inability to tolerate PO, fever, etc, she needs to go back to the ED for urgent CT imaging. Otherwise, if symptoms stable she can increase her omeprazole to BID and I think a trial of oral carafate is reasonable, at least to see if that can provide some benefit overnight. I will see if our staff can coordinate her CT for tomorrow if there are any openings.  Sheri, can you see if there are any openings for STAT CT scan abdomen / pelvis with contrast for this patient on Friday? Currently scheduled for Monday.   Dwana Curd, [place], they called back after clinic visit today. See above.

## 2020-10-07 NOTE — Telephone Encounter (Signed)
Patient called states she was just seen and having a hard time swallowing and has a bit of a fever.

## 2020-10-07 NOTE — Progress Notes (Signed)
ASSESSMENT AND PLAN     # 70 yo female with episodic non-radiating epigastric pain associated with nausea and vomiting.  She has had 6 episodes within the last 14 months.  Seen in ED yesterday, labs unremarkable except for mildly elevated lipase.  She has a known large hiatal hernia.  --Etiology of episodic symptoms unclear, possibly secondary to paraesophageal hernia.  Lipase mildly elevated but possibly from excessive vomiting.  --I will discuss case with primary GI, Dr. Fuller Plan and see if he feels her symptoms could be attributed to this large hiatal hernia and if so will refer for surgical evaluation.  If not then will proceed with CT scan of the abdomen to look for other etiologies of symptoms --Her antireflux medication regimen was changed in the ED yesterday.  At this point I do not see any reason to change therapy.  She will continue on omeprazole and famotidine.  Carafate was prescribed in the ED but not in stock in pharmacy. I asked that she hold off on starting it, especially since it can exacerbate her constipation   # Chronic Constipation, Parkinson's medication likely contributing factor. Takes Benefiber and Miralax every other day .Cannot tolerate daily because if makes stools too soft. Several days ago became constipated. Took Miralax and fiber daily and was able to have a BM yesterday. She has started drinking more water.  --ED stopped her fiber yesterday. I have asked he to resume fiber plus Miralax every other day as she had been previously doing well on. Continue with  Good hydration.   HISTORY OF PRESENT ILLNESS     Primary Gastroenterologist :  Kristin Edward, MD  Chief Complaint : abdominal pain, vomiting.   Kristin [name] is a 70 y.o. female with PMH / [place] significant for,  but not necessarily limited to: Parkinson's disease, IDA, GERD, esophageal stricture, large hiatal hernia with Kristin [name] erosions, diverticulosis, internal hemorrhoids.   In October 2021 patient  underwent EGD and colonoscopy for evaluation of iron deficiency anemia has a history of iron deficiency anemia secondary to [facility] erosions with large hiatal hernia.  Other than diverticulosis and internal hemorrhoids her colonoscopy was normal  Patient comes in today for evaluation of non-radiating, burning epigastric pain associated with nausea and vomiting.  She has had 6 epsodes the same burning pain in the last 14 months. Prior to some of those episodes she recalls having had a hard cider, BBQ sauce, coffee, or lemonade. Each time the pain has started within a minute of eating or drinking these things. .Episodes can last several hours.  No associated dysphagia. She had an episode yesterday, prior to that her last episode was in July.  She was seen in the ED yesterday, labs normal except for lipase of 76. Troponin negative.  Two view of abdomen showed a moderately large left retrocardiac hiatal hernia. This is a paraesophageal hernia on prior MBSS. She vomited about 14 times yesterday.  ED changed her PPI and stopped famotidine but patient hasn't made the changes yet. Carafate prescribed but pharmacy didn't have it in stock   Patient has chronic constipation.  Takes Benefiber and Miralax every other day .Cannot tolerate daily because as it makes stools too soft. Several days ago she became constipated, started taking Miralax and fiber daily and was able to have a BM yesterday. She has started drinking more water. ED stopped her fiber yesterday. I    Data Reviewed: ED yesterday 10/06/20  EKG text Interpretation:  Sinus rhythm Left anterior fascicular block Confirmed by Palumbo, April ([id number]) on 10/07/2020 12:03:03 AM  LFTs normal, lipase mildly elevated at 76 CBC normal.   2 View abdomen  IMPRESSION: 1. Moderately large left retrocardiac hiatal hernia. This is a paraesophageal hernia on prior modified barium swallow. 2. No small bowel obstruction.  Moderate colonic stool burden   Previous  Endoscopic Evaluations / Pertinent Studies:   Oct 2021 EGD for IDA -Benign-appearing esophageal stenosis. Dilated. - Large hiatal hernia. - Erosive gastropathy with no stigmata of recent bleeding associated with the hiatal hernia, c/w [place] erosions. - Multiple gastric polyps. - Normal duodenal bulb and second portion of the duodenum. Biopsies negative.  Oct 2021 Colonoscopy for IDA  -Mild diverticulosis in the sigmoid colon. There was evidence of an impacted diverticulum. - Internal hemorrhoids. - The examination was otherwise normal on direct and retroflexion views. - No specimens collected   Past Medical History:  Diagnosis Date  . Abnormal vaginal Pap smear   . Acute pharyngitis 02/25/2013  . Anemia   . Anxiety   . Arthritis   . 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 Chicken pox as a child  . Chronic UTI    sees dr Kristin [name]  . Constipation 12/07/2015  . Depression with anxiety 11/02/2009   Qualifier: Diagnosis of  By: Kristin Norman, LPN, Kristin [name]   . Dermatitis 07/17/2012  . Esophageal stricture 1994  . Fibroids   . Foot pain, bilateral 06/19/2012  . GERD (gastroesophageal reflux disease)   . GERD (gastroesophageal reflux disease)   . Hiatal hernia   . Hyperglycemia 08/19/2013  . 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 Hyperlipidemia   . Hypertension   . Infertility, female   . Low back pain 10/17/2007   Qualifier: Diagnosis of  By: Kristin Morale MD, Kristin [name]   . Measles as a child  . Obesity   . Osteoarthritis   . Parkinson disease ([place])   . Plantar fasciitis of left foot 06/19/2012  . Preventative health care 12/19/2015  . Rheumatoid arthritis ([place]) 01/08/2018  . Rosacea 10/05/2014  . Swallowing difficulty   . Urinary frequency 02/25/2013  . Visual floaters 05/11/2014    Current Medications, Allergies, Past Surgical History, Family History and Social History were reviewed in [employer] record.   Current Outpatient Medications   Medication Sig Dispense Refill  . B-D TB SYRINGE 1CC/27GX1/2" 27G X 1/2" 1 ML MISC     . carbidopa-levodopa (SINEMET CR) 50-200 MG tablet Take 1 tablet by mouth at bedtime. 90 tablet 1  . carbidopa-levodopa (SINEMET IR) 25-100 MG tablet 2 at 7am, 2 at 11am, 1 at 4pm 450 tablet 1  . carvedilol (COREG) 3.125 MG tablet TAKE 1 TABLET BY MOUTH TWICE A DAY WITH MEALS 180 tablet 1  . Cholecalciferol (VITAMIN D3) 50 MCG (2000 UT) TABS Take 1 capsule by mouth daily. Take daily everyday except Thursdays    . DULoxetine (CYMBALTA) 30 MG capsule Take 1 capsule (30 mg total) by mouth daily. 90 capsule 1  . escitalopram (LEXAPRO) 20 MG tablet Take 1 tablet (20 mg total) by mouth daily. 90 tablet 1  . famotidine (PEPCID) 40 MG tablet TAKE 1 TABLET BY MOUTH EVERYDAY AT BEDTIME 90 tablet 1  . Ferrous Fumarate-Folic Acid (HEMATINIC/FOLIC ACID) 585-2 MG TABS Take 1 tablet by mouth daily. 90 tablet 3  . folic acid (FOLVITE) 1 MG tablet Take 1 mg by mouth 3 (three) times daily.     Marland Kitchen  glycerin adult 2 g suppository Place 1 suppository rectally as needed for constipation. 15 suppository 0  . hydroxychloroquine (PLAQUENIL) 200 MG tablet Take 2 tablets (400 mg total) by mouth daily.    Marland Kitchen losartan (COZAAR) 50 MG tablet TAKE 1 TABLET BY MOUTH EVERY DAY 90 tablet 3  . methotrexate 50 MG/2ML injection Inject 20 mg into the skin once a week.    . pantoprazole (PROTONIX) 40 MG tablet Take 1 tablet (40 mg total) by mouth daily. 30 tablet 0  . polyethylene glycol (MIRALAX) 17 g packet Take 17 g by mouth 2 (two) times daily. 30 each 0  . pramipexole (MIRAPEX) 0.5 MG tablet Take 1 tablet (0.5 mg total) by mouth 3 (three) times daily. 270 tablet 1  . rosuvastatin (CRESTOR) 10 MG tablet TAKE 1 TABLET BY MOUTH ON MONDAYS, WEDNESDAYS AND FRIDAYS 54 tablet 1  . sucralfate (CARAFATE) 1 GM/10ML suspension Take 10 mLs (1 g total) by mouth 4 (four) times daily -  with meals and at bedtime. 420 mL 0  . Vitamin D, Ergocalciferol, (DRISDOL)  1.25 MG (50000 UNIT) CAPS capsule TAKE 1 CAPSULE (50,000 UNITS TOTAL) BY MOUTH EVERY 7 (SEVEN) DAYS. 12 capsule 1  . furosemide (LASIX) 20 MG tablet Take 1 tablet (20 mg total) by mouth daily. On Tuesdays and Satudays only 72 tablet 1   No current facility-administered medications for this visit.    Review of Systems: No chest pain. No shortness of breath. No urinary complaints.   PHYSICAL EXAM :    Wt Readings from Last 3 Encounters:  10/07/20 238 lb 9.6 oz (108.2 kg)  10/06/20 230 lb (104.3 kg)  08/03/20 237 lb (107.5 kg)    BP 137/70   Pulse 84   Ht 5\' 4"  (1.626 m)   Wt 238 lb 9.6 oz (108.2 kg)   SpO2 98%   BMI 40.96 kg/m  Constitutional:  Pleasant female in no acute distress. Psychiatric: Normal mood and affect. Behavior is normal. EENT: Pupils normal.  Conjunctivae are normal. No scleral icterus. Neck supple.  Cardiovascular: Normal rate, regular rhythm. No edema Pulmonary/chest: Effort normal and breath sounds normal. No wheezing, rales or rhonchi. Abdominal: Soft, nondistended, nontender. Bowel sounds active throughout. There are no masses palpable. No hepatomegaly. Neurological: Alert and oriented to person place and time. Skin: Skin is warm and dry. No rashes noted.  Tye Savoy, NP  10/07/2020, 10:57 AM

## 2020-10-08 ENCOUNTER — Other Ambulatory Visit: Payer: Self-pay

## 2020-10-08 ENCOUNTER — Ambulatory Visit (HOSPITAL_BASED_OUTPATIENT_CLINIC_OR_DEPARTMENT_OTHER)
Admission: RE | Admit: 2020-10-08 | Discharge: 2020-10-08 | Disposition: A | Discharge status: Home / Self Care | Payer: Medicare Other | Source: Ambulatory Visit | Attending: Nurse Practitioner | Admitting: Nurse Practitioner

## 2020-10-08 DIAGNOSIS — R1110 Vomiting, unspecified: Secondary | ICD-10-CM | POA: Diagnosis not present

## 2020-10-08 DIAGNOSIS — R112 Nausea with vomiting, unspecified: Secondary | ICD-10-CM | POA: Diagnosis not present

## 2020-10-08 DIAGNOSIS — R1013 Epigastric pain: Secondary | ICD-10-CM | POA: Diagnosis not present

## 2020-10-08 DIAGNOSIS — R748 Abnormal levels of other serum enzymes: Secondary | ICD-10-CM | POA: Insufficient documentation

## 2020-10-08 DIAGNOSIS — R109 Unspecified abdominal pain: Secondary | ICD-10-CM | POA: Diagnosis not present

## 2020-10-08 MED ORDER — IOHEXOL 300 MG/ML  SOLN
100.0000 mL | Freq: Once | INTRAMUSCULAR | Status: AC | PRN
  Administered 2020-10-08: 100 mL via INTRAVENOUS

## 2020-10-08 NOTE — Telephone Encounter (Signed)
I spoke with the patient this am.  She reports that acute pain resolved abruptly last night at 8:00 pm.  She did take the omeprazole and the pepcid, but resolved on its own.  She is " al little sore"  And is reluctant to eat or drink much the pain will return.    The CT has been rescheduled to the [facility] at 6:00.  She is aware of the instructions and will come pick up the contrast from here today.  She is aware she will need to register in the ED at the [facility] as an outpatient and alert them she is there for a 6 pm CT.

## 2020-10-08 NOTE — Telephone Encounter (Signed)
Sheri please call her this morning to reassess her. Thx.

## 2020-10-11 ENCOUNTER — Other Ambulatory Visit: Payer: Self-pay

## 2020-10-11 ENCOUNTER — Ambulatory Visit (HOSPITAL_COMMUNITY): Payer: Medicare Other

## 2020-10-11 DIAGNOSIS — R1013 Epigastric pain: Secondary | ICD-10-CM

## 2020-10-11 DIAGNOSIS — K851 Biliary acute pancreatitis without necrosis or infection: Secondary | ICD-10-CM

## 2020-10-11 DIAGNOSIS — K8500 Idiopathic acute pancreatitis without necrosis or infection: Secondary | ICD-10-CM

## 2020-10-11 NOTE — Telephone Encounter (Signed)
I called patient this am. She is still feeling better. See my comments on CT scan which shows pancreatitis

## 2020-10-11 NOTE — Telephone Encounter (Signed)
See subsequent phone notes and CT scan. We ended up having to expedite CT scan.

## 2020-10-13 NOTE — Progress Notes (Signed)
Assessment/Plan:   1.  Parkinsons Disease -Continue pramipexole 0.5 mg 3 times per day -Continue carbidopa/levodopa 25/100, 2/2/1 (can chew half to 1 in the middle of the night if awakens with tremor) -She will continue Carbidopa/levodopa 50/200 at night. This has definitely helped restless leg and has helped most of the nighttime cramping of the feet and legs.  -discussed opicapone but she decided to hold on that for now.  We can readdress in future if wearing off is a bigger deal. 2. Depression and anxiety - She is on a combination of Lexapro and Cymbalta 3. Dysphagia -last MBE May, 2021 with mild pharyngeal cervical esophageal deficits.  It was recommended to consider esophageal assessment. She has EGD scheduled  4. Rheumatoid arthritis -now on Plaquenil. 5.REM behavior disorder -Understands safety associated with this.  Does not want more medications. 6.Constipation -She is following with gastroenterology.  Just saw the nurse practitioner gastroenterology February 3 and ultimately was diagnosed with acute pancreatitis via CT.  7. Insomnia -discussed sleep habits -add melatonin. States pharmacist told her not to with SSRI. I haven't heard that. She is going to ask PCP if ok. 8,  Iron deficiency anemia             -on iron supplements             -told her that this could be a bit constipating  Subjective:   Kristin [name] was seen today in follow up for Parkinsons disease.  My previous records were reviewed prior to todays visit as well as outside records available to me.  Patient is with her husband who supplements the history.  Pt denies falls.  Pt denies lightheadedness, near syncope.  No hallucinations.  Mood has been good.  she is doing ACT, spin at [employer] and walking.  She was in the emergency room in October for urinary tract infection.  She was  in the emergency room February 2 with epigastric/chest pain.  This was felt likely secondary to constipation/reflux.  She was discharged from the emergency room.  She has seen gastroenterology since that time.  Patient was very uncomfortable and ended up calling back to gastroenterology and they expedited her CT, which ended up showing acute pancreatitis and a large hiatal hernia.  She has finished counseling.  She is feeling "nervous"  Current prescribed movement disorder medications: Current prescribed movement disorder medications: Carbidopa/levodopa 25/100, 2 tablets in the morning, 2 in the afternoon and 1 in the evening Carbidopa/levodopa 50/200 at bedtime Pramipexole 0.5 mg 3 times per day   ALLERGIES:   Allergies  Allergen Reactions  . Bactrim [Sulfamethoxazole-Trimethoprim] Other (See Comments)    Oral ulcers and rash  . Penicillins Hives    Did it involve swelling of the face/tongue/throat, SOB, or low BP? No Did it involve sudden or severe rash/hives, skin peeling, or any reaction on the inside of your mouth or nose? No Did you need to seek medical attention at a hospital or doctor's office? No When did it last happen?30+ years ago If all above answers are "NO", may proceed with cephalosporin use.     CURRENT MEDICATIONS:  Outpatient Encounter Medications as of 10/18/2020  Medication Sig  . carbidopa-levodopa (SINEMET CR) 50-200 MG tablet Take 1 tablet by mouth at bedtime.  . carbidopa-levodopa (SINEMET IR) 25-100 MG tablet 2 at 7am, 2 at 11am, 1 at 4pm  . carvedilol (COREG) 3.125 MG tablet TAKE 1 TABLET BY MOUTH TWICE A DAY WITH MEALS  . Cholecalciferol (  VITAMIN D3) 50 MCG (2000 UT) TABS Take 1 capsule by mouth daily. Take daily everyday except Thursdays  . DULoxetine (CYMBALTA) 30 MG capsule Take 1 capsule (30 mg total) by mouth daily.  Marland Kitchen escitalopram (LEXAPRO) 20 MG tablet Take 1 tablet (20 mg total) by mouth daily.  . famotidine (PEPCID) 40 MG tablet TAKE 1 TABLET  BY MOUTH EVERYDAY AT BEDTIME  . Ferrous Fumarate-Folic Acid (HEMATINIC/FOLIC ACID) 716-9 MG TABS Take 1 tablet by mouth daily.  . folic acid (FOLVITE) 1 MG tablet Take 1 mg by mouth 3 (three) times daily.   . furosemide (LASIX) 20 MG tablet Take 1 tablet (20 mg total) by mouth daily. On Tuesdays and Satudays only  . glycerin adult 2 g suppository Place 1 suppository rectally as needed for constipation.  . hydroxychloroquine (PLAQUENIL) 200 MG tablet Take 2 tablets (400 mg total) by mouth daily.  Marland Kitchen losartan (COZAAR) 50 MG tablet TAKE 1 TABLET BY MOUTH EVERY DAY  . methotrexate 50 MG/2ML injection Inject 20 mg into the skin once a week.  Marland Kitchen omeprazole (PRILOSEC) 40 MG capsule Take 1 capsule (40 mg total) by mouth daily.  . polyethylene glycol (MIRALAX) 17 g packet Take 17 g by mouth 2 (two) times daily.  . pramipexole (MIRAPEX) 0.5 MG tablet Take 1 tablet (0.5 mg total) by mouth 3 (three) times daily.  . rosuvastatin (CRESTOR) 10 MG tablet TAKE 1 TABLET BY MOUTH ON MONDAYS, WEDNESDAYS AND FRIDAYS  . sucralfate (CARAFATE) 1 g tablet Take 1 tablet (1 g total) by mouth every 6 (six) hours as needed.  . Vitamin D, Ergocalciferol, (DRISDOL) 1.25 MG (50000 UNIT) CAPS capsule TAKE 1 CAPSULE (50,000 UNITS TOTAL) BY MOUTH EVERY 7 (SEVEN) DAYS.  Marland Kitchen B-D TB SYRINGE 1CC/27GX1/2" 27G X 1/2" 1 ML MISC   . [DISCONTINUED] albuterol (VENTOLIN HFA) 108 (90 Base) MCG/ACT inhaler Inhale 2 puffs into the lungs every 6 (six) hours as needed for wheezing or shortness of breath.   No facility-administered encounter medications on file as of 10/18/2020.    Objective:   PHYSICAL EXAMINATION:    VITALS:   Vitals:   10/18/20 1451  BP: 110/70  Pulse: 76  SpO2: 98%  Weight: 235 lb (106.6 kg)  Height: 5\' 4"  (1.626 m)    GEN:  The patient appears stated age and is in NAD. HEENT:  Normocephalic, atraumatic.  The mucous membranes are moist. The superficial temporal arteries are without ropiness or tenderness. CV:   RRR Lungs:  CTAB Neck/HEME:  There are no carotid bruits bilaterally.  Neurological examination:  Orientation: The patient is alert and oriented x3. Cranial nerves: There is good facial symmetry with minimal facial hypomimia. The speech is fluent and clear. Soft palate rises symmetrically and there is no tongue deviation. Hearing is intact to conversational tone. Sensation: Sensation is intact to light touch throughout Motor: Strength is at least antigravity x4.  Movement examination: Tone: There is mild increased tone in the RUE (same as previous) Abnormal movements: rare LLE tremor and occ LUE tremor Coordination:  There is min decremation with RAM's Gait and Station: The patient has no difficulty arising out of a deep-seated chair without the use of the hands. The patient's stride length is very good.    I have reviewed and interpreted the following labs independently    Chemistry      Component Value Date/Time   NA 142 10/06/2020 2305   NA 144 12/05/2019 0935   K 3.7 10/06/2020 2305   CL  104 10/06/2020 2305   CO2 26 10/06/2020 2305   BUN 18 10/06/2020 2305   BUN 13 12/05/2019 0935   CREATININE 0.93 10/06/2020 2305   CREATININE 0.71 01/04/2015 1046      Component Value Date/Time   CALCIUM 9.7 10/06/2020 2305   ALKPHOS 71 10/06/2020 2305   AST 24 10/06/2020 2305   ALT 19 10/06/2020 2305   BILITOT 1.0 10/06/2020 2305   BILITOT 0.4 11/27/2016 1123       Lab Results  Component Value Date   WBC 7.9 10/06/2020   HGB 13.6 10/06/2020   HCT 41.5 10/06/2020   MCV 92.6 10/06/2020   PLT 216 10/06/2020    Lab Results  Component Value Date   TSH 1.24 06/18/2020     Total time spent on today's visit was 30 minutes, including both face-to-face time and nonface-to-face time.  Time included that spent on review of records (prior notes available to me/labs/imaging if pertinent), discussing treatment and goals, answering patient's questions and coordinating care.  Cc:   Mosie Lukes, MD

## 2020-10-14 ENCOUNTER — Other Ambulatory Visit: Payer: Self-pay

## 2020-10-14 ENCOUNTER — Ambulatory Visit (HOSPITAL_BASED_OUTPATIENT_CLINIC_OR_DEPARTMENT_OTHER)
Admission: RE | Admit: 2020-10-14 | Discharge: 2020-10-14 | Disposition: A | Discharge status: Home / Self Care | Payer: Medicare Other | Source: Ambulatory Visit | Attending: Nurse Practitioner | Admitting: Nurse Practitioner

## 2020-10-14 DIAGNOSIS — K8500 Idiopathic acute pancreatitis without necrosis or infection: Secondary | ICD-10-CM | POA: Insufficient documentation

## 2020-10-14 DIAGNOSIS — K859 Acute pancreatitis without necrosis or infection, unspecified: Secondary | ICD-10-CM | POA: Diagnosis not present

## 2020-10-14 DIAGNOSIS — R1013 Epigastric pain: Secondary | ICD-10-CM | POA: Diagnosis not present

## 2020-10-15 ENCOUNTER — Telehealth: Payer: Self-pay | Admitting: Nurse Practitioner

## 2020-10-15 ENCOUNTER — Other Ambulatory Visit: Payer: Self-pay

## 2020-10-15 DIAGNOSIS — K219 Gastro-esophageal reflux disease without esophagitis: Secondary | ICD-10-CM

## 2020-10-15 DIAGNOSIS — K449 Diaphragmatic hernia without obstruction or gangrene: Secondary | ICD-10-CM

## 2020-10-15 NOTE — Telephone Encounter (Signed)
Inbound call from patient requesting results from CT scan and ultrasound please.

## 2020-10-15 NOTE — Telephone Encounter (Signed)
Spoke with the patient. She is in agreement with having the UGI series and follow up with [facility] afterwards.

## 2020-10-18 ENCOUNTER — Encounter: Payer: Self-pay | Admitting: Neurology

## 2020-10-18 ENCOUNTER — Other Ambulatory Visit: Payer: Self-pay

## 2020-10-18 ENCOUNTER — Ambulatory Visit (INDEPENDENT_AMBULATORY_CARE_PROVIDER_SITE_OTHER): Payer: Medicare Other | Admitting: Neurology

## 2020-10-18 VITALS — BP 110/70 | HR 76 | Ht 64.0 in | Wt 235.0 lb

## 2020-10-18 DIAGNOSIS — G20 Parkinson's disease: Secondary | ICD-10-CM

## 2020-10-18 DIAGNOSIS — L570 Actinic keratosis: Secondary | ICD-10-CM | POA: Diagnosis not present

## 2020-10-18 DIAGNOSIS — Z85828 Personal history of other malignant neoplasm of skin: Secondary | ICD-10-CM | POA: Diagnosis not present

## 2020-10-18 DIAGNOSIS — L814 Other melanin hyperpigmentation: Secondary | ICD-10-CM | POA: Diagnosis not present

## 2020-10-18 DIAGNOSIS — D225 Melanocytic nevi of trunk: Secondary | ICD-10-CM | POA: Diagnosis not present

## 2020-10-18 DIAGNOSIS — L821 Other seborrheic keratosis: Secondary | ICD-10-CM | POA: Diagnosis not present

## 2020-10-18 NOTE — Patient Instructions (Signed)
Online Resources for Power over Parkinson's Group February 2022  . Local [place] Online Groups  o Power over [employer] :   - Power Over Parkinson's Patient Education Group will be Wednesday, February 9th at 2pm via Zoom.   - Upcoming Power over Parkinson's Meetings:  2nd Wednesdays of the month at 2 pm:       March 9th, April 13th - Contact Amy Marriott at [email or web address] if interested in participating in this online group o Parkinson's Care Partners Group:    3rd Mondays, Contact Corwin Levins o Atypical Parkinsonian Patient Group:   4th Wednesdays, Contact Corwin Levins o If you are interested in participating in these online groups with Judson Roch, please contact her directly for how to join those meetings.  Her contact information is [email or web address].  She will send you a link to join the [employer].  (Please note that Corwin Levins , MSW, LCSW, has resigned her position at [facility], but will continue to lead the online groups temporarily)  . [place]:  [email or web address] o PD Health at Home continues:  Mindfulness Mondays, Expert Briefing Tuesdays, Wellness Wednesdays, Take Time Thursdays, Fitness Fridays -Listings for February 2022 are on the website o Upcoming Webinar:  Sights, Sounds, and Parkinson's.  Wednesday, February 2 @ 1 pm o Upcoming Webinar:  Conversations about Complementary Therapies and PD.  Wednesday, March 2 @ 1 pm o Geneticist, molecular) at [email or web address] o  Please check out their website to sign up for emails and see their full online offerings  . [place]:  [email or web address]  o Upcoming Webinar:   Moving with Mood Changes in Aging and Parkinson's:  A Look at Depression and Anxiety.  (This is a replay of a webinar originally aired June 2020)  Thursday, February 17 @ 12 noon o Check out additional information on their website to see their full online  offerings  . [place]:  [email or web address] o Upcoming Webinar:  The [place].  The Parkinson's You Don't See:  When your Autonomic System goes Off-Track.   Friday, February 18th.  Go to [email or web address], then click on Events, 08-08-1975 tab to register. o Care Partner Monthly Meetup.  With [employer] [place].  First Tuesday of each month, 2 pm o Check out additional information to Live Well Today on their website  . Parkinson and Movement Disorders (PMD) Alliance:  www.pmdalliance.org o [employer] Online:  Online Education Events o Sign up for emails, which are sent weekly to give you updates on programming and online offerings . Parkinson's Association of the [place]:  www.[email or web address] o Information on online support groups, education events, and online exercises including Yoga, Parkinson's exercises and more-LOTS of information on links to PD resources and online events o Virtual Support Group through Parkinson's Association of the [place]; next one is scheduled for Wednesday, November 03, 2020 at 2 pm. (These are typically scheduled for the 1st Wednesday of the month at 2 pm).  Visit website for details.  . Additional links for movement activities: o PWR! Moves Classes at [place] RESUMED (but are ON HOLD DUE TO COVID in February)  Contact Amy [place], PT [email or web address] or [phone] if interested o Here is a link to the PWR!Moves classes on Zoom from [phone] - Daily Mon-Sat at 10:00. Via Zoom, FREE and open to all.  There is also a link below via Facebook if you use that platform. - [place] - [email or web address] o  Parkinson's Wellness  Recovery (PWR! Moves)  www.[email or web address] - Info on the PWR! Virtual Experience:  You will have access to our expertise through self-assessment, guided plans that start with the PD-specific fundamentals, educational content, tips, Q&A with an expert, and a growing Art therapist of PD-specific pre-recorded and live exercise classes of varying types and intensity - both physical and cognitive! If that is not enough, we offer 1:1 wellness consultations (in-person or virtual) to personalize your PWR! Research scientist (medical).  - Check out the PWR! Move of the month on the [place] [facility] website:  [email or web address] o [employer] Fridays:  - As part of the PD Health @ Home program, this free video series focuses each week on one aspect of fitness designed to support people living with Parkinson's.  These weekly videos highlight the [place] recent fitness guidelines for people with Parkinson's disease. -  [email or web address].dk o Dance for PD website is offering free, live-stream classes throughout the week, as well as links to [employer] of classes:  [email or web address] o Dance for Parkinson's Class:  [place].  Free offering for people with Parkinson's and care partners; virtual class.  o For more information, contact [phone] or email Ruffin Frederick at [email or web address] o Virtual dance and Pilates for Parkinson's classes: Click on the Community Tab> Parkinson's Movement Initiative Tab.  To register for classes and for more information, visit www.[email or web address].za and click the "community" tab.    o [employer] Parkinson's Cycling Classes  - [facility]: 1pm on Fridays-Live classes at [facility] [employer] at [email or web address] or [phone]) [facility]: Virtual Classes Mondays and Thursdays (contact  [place] at [email or web address] or [phone])   o [place] levels of classes are offered Tuesdays and Thursdays:  10:30 am,  12 noon & 1:45 pm at [facility]. To observe a class or for  more information, call [phone] or email [email or web address] - PD Flow Yoga for Parkinson's:  Fridays 1-2 pm, January 7-October 29, 2020 . [employer]Spring [employer]: o Chief Technology Officer Opportunities:  www.well-springsolutions.org/caregiver-education/caregiver-support-group.  You may also contact Vickki Muff at [email or web address] or [phone].   o Virtual Caregiver Retreat, Monday, February 21st, 3-5 pm o Powerful Tools for Caregivers, 6-wk series for caregivers, planning to be in person, starting March 10th o Well-Spring Navigator:  March 23 program, a free service to help individuals and families through the journey of determining care for older adults.  The "Navigator" is a [employer], Education officer, museum, who will speak with a prospective client and/or loved ones to provide an assessment of the situation and a set of recommendations for a personalized care plan -- all free of charge, and whether [facility] [employer] offers the needed service or not. If the need is not a service we provide, we are well-connected with reputable programs in town that we can refer you to.  www.well-springsolutions.org or to speak with the Navigator, call [phone].

## 2020-10-20 ENCOUNTER — Ambulatory Visit (HOSPITAL_COMMUNITY)
Admission: RE | Admit: 2020-10-20 | Discharge: 2020-10-20 | Disposition: A | Discharge status: Home / Self Care | Payer: Medicare Other | Source: Ambulatory Visit | Attending: Nurse Practitioner | Admitting: Nurse Practitioner

## 2020-10-20 ENCOUNTER — Other Ambulatory Visit: Payer: Self-pay | Admitting: Nurse Practitioner

## 2020-10-20 ENCOUNTER — Other Ambulatory Visit: Payer: Self-pay

## 2020-10-20 DIAGNOSIS — K449 Diaphragmatic hernia without obstruction or gangrene: Secondary | ICD-10-CM

## 2020-10-20 DIAGNOSIS — K219 Gastro-esophageal reflux disease without esophagitis: Secondary | ICD-10-CM

## 2020-10-21 DIAGNOSIS — R3915 Urgency of urination: Secondary | ICD-10-CM | POA: Diagnosis not present

## 2020-10-21 DIAGNOSIS — N3941 Urge incontinence: Secondary | ICD-10-CM | POA: Diagnosis not present

## 2020-10-25 ENCOUNTER — Ambulatory Visit: Payer: Medicare Other | Admitting: *Deleted

## 2020-10-25 ENCOUNTER — Ambulatory Visit: Payer: Medicare Other | Admitting: Neurology

## 2020-10-27 DIAGNOSIS — Z79899 Other long term (current) drug therapy: Secondary | ICD-10-CM | POA: Diagnosis not present

## 2020-10-27 DIAGNOSIS — K469 Unspecified abdominal hernia without obstruction or gangrene: Secondary | ICD-10-CM | POA: Diagnosis not present

## 2020-10-27 DIAGNOSIS — M0609 Rheumatoid arthritis without rheumatoid factor, multiple sites: Secondary | ICD-10-CM | POA: Diagnosis not present

## 2020-10-27 NOTE — Progress Notes (Signed)
Subjective:   Kristin [name] is a 70 y.o. female who presents for Medicare Annual (Subsequent) preventive examination.  Review of Systems     Cardiac Risk Factors include: advanced age (>87men, >42 women);hypertension;dyslipidemia;obesity (BMI >30kg/m2)     Objective:    Today's Vitals   10/28/20 1317  BP: 136/84  Pulse: 70  Resp: 16  Temp: 98.4 F (36.9 C)  TempSrc: Oral  SpO2: 94%  Weight: 234 lb 6.4 oz (106.3 kg)  Height: 5\' 4"  (1.626 m)   Body mass index is 40.23 kg/m.  Advanced Directives 10/28/2020 10/18/2020 04/22/2020 03/31/2020 10/23/2019 06/02/2019 05/05/2019  Does Patient Have a Medical Advance Directive? Yes Yes Yes No Yes Yes Yes  Type of Paramedic of [place];Living will [place];Living will [place];Living will - [place];Living will [place];Living will Living will;Healthcare Power of Attorney  Does patient want to make changes to medical advance directive? - - - - No - Patient declined No - Patient declined No - Patient declined  Copy of [place] in Chart? Yes - validated most recent copy scanned in chart (See row information) - - - Yes - validated most recent copy scanned in chart (See row information) Yes - validated most recent copy scanned in chart (See row information) -  Would patient like information on creating a medical advance directive? - - - No - Patient declined - - -    Current Medications (verified) Outpatient Encounter Medications as of 10/28/2020  Medication Sig  . B-D TB SYRINGE 1CC/27GX1/2" 27G X 1/2" 1 ML MISC   . carbidopa-levodopa (SINEMET CR) 50-200 MG tablet Take 1 tablet by mouth at bedtime.  . carbidopa-levodopa (SINEMET IR) 25-100 MG tablet 2 at 7am, 2 at 11am, 1 at 4pm  . Cholecalciferol (VITAMIN D3) 50 MCG (2000 UT) TABS Take 1 capsule by mouth daily. Take daily everyday except Thursdays  . DULoxetine (CYMBALTA)  30 MG capsule Take 1 capsule (30 mg total) by mouth daily.  Marland Kitchen escitalopram (LEXAPRO) 20 MG tablet Take 1 tablet (20 mg total) by mouth daily.  . famotidine (PEPCID) 40 MG tablet TAKE 1 TABLET BY MOUTH EVERYDAY AT BEDTIME  . Ferrous Fumarate-Folic Acid (HEMATINIC/FOLIC ACID) 010-9 MG TABS Take 1 tablet by mouth daily.  . folic acid (FOLVITE) 1 MG tablet Take 1 mg by mouth 3 (three) times daily.   Marland Kitchen glycerin adult 2 g suppository Place 1 suppository rectally as needed for constipation.  . hydroxychloroquine (PLAQUENIL) 200 MG tablet Take 2 tablets (400 mg total) by mouth daily.  Marland Kitchen losartan (COZAAR) 50 MG tablet TAKE 1 TABLET BY MOUTH EVERY DAY  . methotrexate 50 MG/2ML injection Inject 20 mg into the skin once a week.  Marland Kitchen omeprazole (PRILOSEC) 40 MG capsule Take 1 capsule (40 mg total) by mouth daily.  . polyethylene glycol (MIRALAX) 17 g packet Take 17 g by mouth 2 (two) times daily.  . pramipexole (MIRAPEX) 0.5 MG tablet Take 1 tablet (0.5 mg total) by mouth 3 (three) times daily.  . rosuvastatin (CRESTOR) 10 MG tablet TAKE 1 TABLET BY MOUTH ON MONDAYS, WEDNESDAYS AND FRIDAYS  . sucralfate (CARAFATE) 1 g tablet Take 1 tablet (1 g total) by mouth every 6 (six) hours as needed.  . Vitamin D, Ergocalciferol, (DRISDOL) 1.25 MG (50000 UNIT) CAPS capsule TAKE 1 CAPSULE (50,000 UNITS TOTAL) BY MOUTH EVERY 7 (SEVEN) DAYS.  . [DISCONTINUED] carvedilol (COREG) 3.125 MG tablet TAKE  1 TABLET BY MOUTH TWICE A DAY WITH MEALS  . furosemide (LASIX) 20 MG tablet Take 1 tablet (20 mg total) by mouth daily. On Tuesdays and Satudays only  . [DISCONTINUED] albuterol (VENTOLIN HFA) 108 (90 Base) MCG/ACT inhaler Inhale 2 puffs into the lungs every 6 (six) hours as needed for wheezing or shortness of breath.   No facility-administered encounter medications on file as of 10/28/2020.    Allergies (verified) Bactrim [sulfamethoxazole-trimethoprim] and Penicillins   History: Past Medical History:  Diagnosis Date  .  Abnormal vaginal Pap smear   . Acute pharyngitis 02/25/2013  . Anemia   . Anxiety   . Arthritis   . 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 Chicken pox as a child  . Chronic UTI    sees dr Terance Hart  . Constipation 12/07/2015  . Depression with anxiety 11/02/2009   Qualifier: Diagnosis of  By: [name], LPN, [name]   . Dermatitis 07/17/2012  . Esophageal stricture 1994  . Fibroids   . Foot pain, bilateral 06/19/2012  . GERD (gastroesophageal reflux disease)   . GERD (gastroesophageal reflux disease)   . Hiatal hernia   . Hyperglycemia 08/19/2013  . 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 Hyperlipidemia   . Hypertension   . Infertility, female   . Low back pain 10/17/2007   Qualifier: Diagnosis of  By: [name], [name]   . Measles as a child  . Obesity   . Osteoarthritis   . Parkinson disease ([place])   . Plantar fasciitis of left foot 06/19/2012  . Preventative health care 12/19/2015  . Rheumatoid arthritis ([place]) 01/08/2018  . Rosacea 10/05/2014  . Swallowing difficulty   . Urinary frequency 02/25/2013  . Visual floaters 05/11/2014   Past Surgical History:  Procedure Laterality Date  . ABDOMINAL HYSTERECTOMY  2006   total  . esophageal     stretching  . laporoscopy    . LEFT HEART CATH AND CORONARY ANGIOGRAPHY N/A 06/02/2019   Procedure: LEFT HEART CATH AND CORONARY ANGIOGRAPHY;  Surgeon: Jettie Booze, MD;  Location: [facility];  Service: Cardiovascular;  Laterality: N/A;  . TONSILLECTOMY    . TOTAL HIP ARTHROPLASTY Right 2010  . wisdom teeth extracted     Family History  Problem Relation Age of Onset  . Cancer Mother        breast  . Other Mother        arrythmia  . Mental illness Mother        bipolar  . Hyperlipidemia Mother   . Thyroid disease Mother   . Depression Mother   . Bipolar disorder Mother   . Heart disease Father   . Arthritis Father        rheumatoid  . Hypertension Father   . Hyperlipidemia Father   . Depression  Sister   . Mental illness Sister        bipolar  . Parkinson's disease Sister   . Arthritis Sister   . Arthritis Sister   . Arthritis Sister   . Colon cancer Neg Hx   . Esophageal cancer Neg Hx   . Rectal cancer Neg Hx   . Stomach cancer Neg Hx    Social History   Socioeconomic History  . Marital status: Married    Spouse name: Not on file  . Number of children: 1  . Years of education: Not on file  . Highest education level: Master's degree (e.g., MA, MS, MEng,  MEd, MSW, MBA)  Occupational History  . Occupation: Retired Teacher, music  Tobacco Use  . Smoking status: Never Smoker  . Smokeless tobacco: Never Used  Vaping Use  . Vaping Use: Never used  Substance and Sexual Activity  . Alcohol use: Yes    Alcohol/week: 4.0 standard drinks    Types: 4 Glasses of wine per week    Comment: occasionally  . Drug use: No  . Sexual activity: Not Currently    Partners: Male  Other Topics Concern  . Not on file  Social History Narrative   Lives with husband, no major dietary restrictions, retired from teaching      Husband dx with cancer MDS June 2017.   Currently in remission. (11/03/16 pc)      Pt has one biological child the other 3 are adopted      Social Determinants of Health   Financial Resource Strain: Low Risk   . Difficulty of Paying Living Expenses: Not hard at all  Food Insecurity: No Food Insecurity  . Worried About Charity fundraiser in the Last Year: Never true  . Ran Out of Food in the Last Year: Never true  Transportation Needs: No Transportation Needs  . Lack of Transportation (Medical): No  . Lack of Transportation (Non-Medical): No  Physical Activity: Sufficiently Active  . Days of Exercise per Week: 6 days  . Minutes of Exercise per Session: 40 min  Stress: No Stress Concern Present  . Feeling of Stress : Not at all  Social Connections: Moderately Integrated  . Frequency of Communication with Friends and Family: More than three times a week  .  Frequency of Social Gatherings with Friends and Family: Once a week  . Attends Religious Services: 1 to 4 times per year  . Active Member of Clubs or Organizations: No  . Attends Archivist Meetings: Never  . Marital Status: Married    Tobacco Counseling Counseling given: Not Answered   Clinical Intake:  Pre-visit preparation completed: Yes  Pain : No/denies pain     Nutritional Status: BMI > 30  Obese Nutritional Risks: None Diabetes: No  How often do you need to have someone help you when you read instructions, pamphlets, or other written materials from your doctor or pharmacy?: 1 - Never  Diabetic?No  Interpreter Needed?: No  Information entered by :: Caroleen Hamman LPN   Activities of Daily Living In your present state of health, do you have any difficulty performing the following activities: 10/28/2020  Hearing? N  Vision? N  Difficulty concentrating or making decisions? N  Walking or climbing stairs? N  Dressing or bathing? N  Doing errands, shopping? N  Preparing Food and eating ? N  Using the Toilet? N  In the past six months, have you accidently leaked urine? N  Do you have problems with loss of bowel control? N  Managing your Medications? N  Managing your Finances? N  Housekeeping or managing your Housekeeping? N  Some recent data might be hidden    Patient Care Team: Mosie Lukes, MD as PCP - General (Family Medicine) Berniece Salines, DO as PCP - Cardiology (Cardiology) Tat, Eustace Quail, DO as Consulting Physician (Neurology) Tat, Eustace Quail, DO as Consulting Physician (Neurology)  Indicate any recent Medical Services you may have received from other than [facility] providers in the past year (date may be approximate).     Assessment:   This is a routine wellness examination for [facility].  Hearing/Vision screen  Hearing Screening   125Hz  250Hz  500Hz  1000Hz  2000Hz  3000Hz  4000Hz  6000Hz  8000Hz   Right ear:           Left ear:           Comments:  No issues  Vision Screening Comments: Wears glasses Last eye exam-2021- Ophthalmology  Dietary issues and exercise activities discussed: Current Exercise Habits: Home exercise routine, Type of exercise: strength training/weights;Other - see comments;walking (swimming, spin class), Time (Minutes): 40, Frequency (Times/Week): 6, Weekly Exercise (Minutes/Week): 240, Intensity: Mild, Exercise limited by: None identified  Goals      Patient Stated   .  Keep a postive mindset. (pt-stated)      Other   .  Increase physical activity      Exercise 4-6 days per week    .  Patient Stated      Drink more water & lose more weight      Depression Screen PHQ 2/9 Scores 10/28/2020 06/17/2020 03/16/2020 10/23/2019 10/17/2018 10/15/2017 05/10/2017  PHQ - 2 Score 0 0 0 0 2 2 0  PHQ- 9 Score - 0 - - 3 2 0    Fall Risk Fall Risk  10/28/2020 10/18/2020 04/22/2020 03/16/2020 10/23/2019  Falls in the past year? 0 0 0 0 0  Number falls in past yr: 0 0 0 - 0  Injury with Fall? 0 0 0 - 0  Follow up Falls prevention discussed - - - Education provided;Falls prevention discussed    FALL RISK PREVENTION PERTAINING TO THE HOME:  Any stairs in or around the home? Yes  If so, are there any without handrails? No  Home free of loose throw rugs in walkways, pet beds, electrical cords, etc? Yes  Adequate lighting in your home to reduce risk of falls? Yes   ASSISTIVE DEVICES UTILIZED TO PREVENT FALLS:  Life alert? No  Use of a cane, walker or w/c? No  Grab bars in the bathroom? Yes  Shower chair or bench in shower? No  Elevated toilet seat or a handicapped toilet? No   TIMED UP AND GO:  Was the test performed? Yes .  Length of time to ambulate 10 feet: 10 sec.   Gait steady and fast without use of assistive device  Cognitive Function: MMSE - Mini Mental State Exam 10/15/2017  Orientation to time 5  Orientation to Place 5  Registration 3  Attention/ Calculation 5  Recall 3  Language- name 2  objects 2  Language- repeat 1  Language- follow 3 step command 3  Language- read & follow direction 1  Write a sentence 1  Copy design 1  Total score 30        Immunizations Immunization History  Administered Date(s) Administered  . Influenza Split 06/19/2012  . Influenza Whole 07/05/2007, 07/08/2009  . Influenza, High Dose Seasonal PF 05/04/2016, 05/10/2017, 05/07/2018, 04/29/2019, 06/17/2020  . Influenza,inj,Quad PF,6+ Mos 07/10/2013, 05/05/2014  . PFIZER(Purple Top)SARS-COV-2 Vaccination 10/07/2019, 10/30/2019, 05/05/2020  . Pneumococcal Conjugate-13 07/10/2013  . Pneumococcal Polysaccharide-23 12/07/2015  . Td 09/04/2004  . Tdap 01/04/2015  . Zoster Recombinat (Shingrix) 12/20/2017, 03/11/2018    TDAP status: Up to date  Flu Vaccine status: Up to date  Pneumococcal vaccine status: Up to date  Covid-19 vaccine status: Completed vaccines  Qualifies for Shingles Vaccine? No   Zostavax completed No   Shingrix Completed?: Yes  Screening Tests Health Maintenance  Topic Date Due  . COVID-19 Vaccine (4 - Booster for Pfizer series) 11/02/2020  . MAMMOGRAM  12/23/2021  .  TETANUS/TDAP  01/03/2025  . INFLUENZA VACCINE  Completed  . DEXA SCAN  Completed  . Hepatitis C Screening  Completed  . PNA vac Low Risk Adult  Completed    Health Maintenance  There are no preventive care reminders to display for this patient.  Colorectal cancer screening: No longer required.  Per colonoscopy report from 06/07/2020  Mammogram status:-Scheduled for 01/25/2021  Bone Density status: Ordered today. Pt provided with contact info and advised to call to schedule appt.  Lung Cancer Screening: (Low Dose CT Chest recommended if Age 19-80 years, 30 pack-year currently smoking OR have quit w/in 15years.) does not qualify.     Additional Screening:  Hepatitis C Screening:Completed 12/07/2015  Vision Screening: Recommended annual ophthalmology exams for early detection of glaucoma and  other disorders of the eye. Is the patient up to date with their annual eye exam?  Yes  Who is the provider or what is the name of the office in which the patient attends annual eye exams? [facility]   Dental Screening: Recommended annual dental exams for proper oral hygiene  Community Resource Referral / Chronic Care Management: CRR required this visit?  No   CCM required this visit?  No      Plan:     I have personally reviewed and noted the following in the patient's chart:   . Medical and social history . Use of alcohol, tobacco or illicit drugs  . Current medications and supplements . Functional ability and status . Nutritional status . Physical activity . Advanced directives . List of other physicians . Hospitalizations, surgeries, and ER visits in previous 12 months . Vitals . Screenings to include cognitive, depression, and falls . Referrals and appointments  In addition, I have reviewed and discussed with patient certain preventive protocols, quality metrics, and best practice recommendations. A written personalized care plan for preventive services as well as general preventive health recommendations were provided to patient.   Patient to access avs on mychart.  Marta Antu, LPN   [id number]  Nurse Health Advisor  Nurse Notes: None

## 2020-10-28 ENCOUNTER — Other Ambulatory Visit: Payer: Self-pay | Admitting: Neurology

## 2020-10-28 ENCOUNTER — Other Ambulatory Visit: Payer: Self-pay | Admitting: Family Medicine

## 2020-10-28 ENCOUNTER — Other Ambulatory Visit: Payer: Self-pay

## 2020-10-28 ENCOUNTER — Ambulatory Visit (INDEPENDENT_AMBULATORY_CARE_PROVIDER_SITE_OTHER): Payer: Medicare Other

## 2020-10-28 VITALS — BP 136/84 | HR 70 | Temp 98.4°F | Resp 16 | Ht 64.0 in | Wt 234.4 lb

## 2020-10-28 DIAGNOSIS — Z780 Asymptomatic menopausal state: Secondary | ICD-10-CM | POA: Diagnosis not present

## 2020-10-28 DIAGNOSIS — Z0000 Encounter for general adult medical examination without abnormal findings: Secondary | ICD-10-CM | POA: Diagnosis not present

## 2020-10-28 NOTE — Telephone Encounter (Signed)
Rx(s) sent to pharmacy electronically.  

## 2020-10-28 NOTE — Patient Instructions (Signed)
Kristin [name] , Thank you for taking time to come for your Medicare Wellness Visit. I appreciate your ongoing commitment to your health goals. Please review the following plan we discussed and let me know if I can assist you in the future.   Screening recommendations/referrals: Colonoscopy: Completed 06/07/2020-No longer required Mammogram: Scheduled for 01/25/2021 Bone Density: Ordered today. Someone will be calling you to schedule. Recommended yearly ophthalmology/optometry visit for glaucoma screening and checkup Recommended yearly dental visit for hygiene and checkup  Vaccinations: Influenza vaccine: Up to date Pneumococcal vaccine: Completed vaccines Tdap vaccine: Up to date-Due-01/03/2025 Shingles vaccine: Completed vaccines   Covid-19:Completed vaccines  Advanced directives: Copy in chart  Conditions/risks identified: See problem list  Next appointment: Follow up in one year for your annual wellness visit 11/03/2021 @ 1:20  Preventive Care 65 Years and Older, Female Preventive care refers to lifestyle choices and visits with your health care provider that can promote health and wellness. What does preventive care include?  A yearly physical exam. This is also called an annual well check.  Dental exams once or twice a year.  Routine eye exams. Ask your health care provider how often you should have your eyes checked.  Personal lifestyle choices, including:  Daily care of your teeth and gums.  Regular physical activity.  Eating a healthy diet.  Avoiding tobacco and drug use.  Limiting alcohol use.  Practicing safe sex.  Taking low-dose aspirin every day.  Taking vitamin and mineral supplements as recommended by your health care provider. What happens during an annual well check? The services and screenings done by your health care provider during your annual well check will depend on your age, overall health, lifestyle risk factors, and family history of  disease. Counseling  Your health care provider may ask you questions about your:  Alcohol use.  Tobacco use.  Drug use.  Emotional well-being.  Home and relationship well-being.  Sexual activity.  Eating habits.  History of falls.  Memory and ability to understand (cognition).  Work and work Statistician.  Reproductive health. Screening  You may have the following tests or measurements:  Height, weight, and BMI.  Blood pressure.  Lipid and cholesterol levels. These may be checked every 5 years, or more frequently if you are over 34 years old.  Skin check.  Lung cancer screening. You may have this screening every year starting at age 1 if you have a 30-pack-year history of smoking and currently smoke or have quit within the past 15 years.  Fecal occult blood test (FOBT) of the stool. You may have this test every year starting at age 34.  Flexible sigmoidoscopy or colonoscopy. You may have a sigmoidoscopy every 5 years or a colonoscopy every 10 years starting at age 56.  Hepatitis C blood test.  Hepatitis B blood test.  Sexually transmitted disease (STD) testing.  Diabetes screening. This is done by checking your blood sugar (glucose) after you have not eaten for a while (fasting). You may have this done every 1-3 years.  Bone density scan. This is done to screen for osteoporosis. You may have this done starting at age 10.  Mammogram. This may be done every 1-2 years. Talk to your health care provider about how often you should have regular mammograms. Talk with your health care provider about your test results, treatment options, and if necessary, the need for more tests. Vaccines  Your health care provider may recommend certain vaccines, such as:  Influenza vaccine. This is recommended every  year.  Tetanus, diphtheria, and acellular pertussis (Tdap, Td) vaccine. You may need a Td booster every 10 years.  Zoster vaccine. You may need this after age  42.  Pneumococcal 13-valent conjugate (PCV13) vaccine. One dose is recommended after age 54.  Pneumococcal polysaccharide (PPSV23) vaccine. One dose is recommended after age 89. Talk to your health care provider about which screenings and vaccines you need and how often you need them. This information is not intended to replace advice given to you by your health care provider. Make sure you discuss any questions you have with your health care provider. Document Released: 09/17/2015 Document Revised: 05/10/2016 Document Reviewed: 06/22/2015 Elsevier Interactive Patient Education  2017 [employer] Prevention in the Home Falls can cause injuries. They can happen to people of all ages. There are many things you can do to make your home safe and to help prevent falls. What can I do on the outside of my home?  Regularly fix the edges of walkways and driveways and fix any cracks.  Remove anything that might make you trip as you walk through a door, such as a raised step or threshold.  Trim any bushes or trees on the path to your home.  Use bright outdoor lighting.  Clear any walking paths of anything that might make someone trip, such as rocks or tools.  Regularly check to see if handrails are loose or broken. Make sure that both sides of any steps have handrails.  Any raised decks and porches should have guardrails on the edges.  Have any leaves, snow, or ice cleared regularly.  Use sand or salt on walking paths during winter.  Clean up any spills in your garage right away. This includes oil or grease spills. What can I do in the bathroom?  Use night lights.  Install grab bars by the toilet and in the tub and shower. Do not use towel bars as grab bars.  Use non-skid mats or decals in the tub or shower.  If you need to sit down in the shower, use a plastic, non-slip stool.  Keep the floor dry. Clean up any water that spills on the floor as soon as it happens.  Remove  soap buildup in the tub or shower regularly.  Attach bath mats securely with double-sided non-slip rug tape.  Do not have throw rugs and other things on the floor that can make you trip. What can I do in the bedroom?  Use night lights.  Make sure that you have a light by your bed that is easy to reach.  Do not use any sheets or blankets that are too big for your bed. They should not hang down onto the floor.  Have a firm chair that has side arms. You can use this for support while you get dressed.  Do not have throw rugs and other things on the floor that can make you trip. What can I do in the kitchen?  Clean up any spills right away.  Avoid walking on wet floors.  Keep items that you use a lot in easy-to-reach places.  If you need to reach something above you, use a strong step stool that has a grab bar.  Keep electrical cords out of the way.  Do not use floor polish or wax that makes floors slippery. If you must use wax, use non-skid floor wax.  Do not have throw rugs and other things on the floor that can make you trip. What can  I do with my stairs?  Do not leave any items on the stairs.  Make sure that there are handrails on both sides of the stairs and use them. Fix handrails that are broken or loose. Make sure that handrails are as long as the stairways.  Check any carpeting to make sure that it is firmly attached to the stairs. Fix any carpet that is loose or worn.  Avoid having throw rugs at the top or bottom of the stairs. If you do have throw rugs, attach them to the floor with carpet tape.  Make sure that you have a light switch at the top of the stairs and the bottom of the stairs. If you do not have them, ask someone to add them for you. What else can I do to help prevent falls?  Wear shoes that:  Do not have high heels.  Have rubber bottoms.  Are comfortable and fit you well.  Are closed at the toe. Do not wear sandals.  If you use a  stepladder:  Make sure that it is fully opened. Do not climb a closed stepladder.  Make sure that both sides of the stepladder are locked into place.  Ask someone to hold it for you, if possible.  Clearly mark and make sure that you can see:  Any grab bars or handrails.  First and last steps.  Where the edge of each step is.  Use tools that help you move around (mobility aids) if they are needed. These include:  Canes.  Walkers.  Scooters.  Crutches.  Turn on the lights when you go into a dark area. Replace any light bulbs as soon as they burn out.  Set up your furniture so you have a clear path. Avoid moving your furniture around.  If any of your floors are uneven, fix them.  If there are any pets around you, be aware of where they are.  Review your medicines with your doctor. Some medicines can make you feel dizzy. This can increase your chance of falling. Ask your doctor what other things that you can do to help prevent falls. This information is not intended to replace advice given to you by your health care provider. Make sure you discuss any questions you have with your health care provider. Document Released: 06/17/2009 Document Revised: 01/27/2016 Document Reviewed: 09/25/2014 Elsevier Interactive Patient Education  2017 [employer].

## 2020-10-29 ENCOUNTER — Telehealth: Payer: Self-pay | Admitting: Neurology

## 2020-10-29 NOTE — Telephone Encounter (Signed)
Regarding upcoming surgery, I recommend:  -take your parkinsons medication the AM of surgery  -if medication for nausea is needed after surgery, I recommend zofran instead of phenergan as phenergan can make parkinsons worse

## 2020-10-29 NOTE — Telephone Encounter (Signed)
Patient called in stating she has a hernia that is going to have to be surgically fixed. She is wondering what all she needs to be careful of with the anesthesia and her Parkinson's?

## 2020-10-29 NOTE — Telephone Encounter (Signed)
Patient notified directly and voiced understanding.   She states she would write it down.

## 2020-11-01 DIAGNOSIS — G20 Parkinson's disease: Secondary | ICD-10-CM | POA: Diagnosis not present

## 2020-11-01 DIAGNOSIS — R110 Nausea: Secondary | ICD-10-CM | POA: Diagnosis not present

## 2020-11-01 DIAGNOSIS — K449 Diaphragmatic hernia without obstruction or gangrene: Secondary | ICD-10-CM | POA: Diagnosis not present

## 2020-11-01 DIAGNOSIS — Z01812 Encounter for preprocedural laboratory examination: Secondary | ICD-10-CM | POA: Diagnosis not present

## 2020-11-01 DIAGNOSIS — Z20822 Contact with and (suspected) exposure to covid-19: Secondary | ICD-10-CM | POA: Diagnosis not present

## 2020-11-03 ENCOUNTER — Ambulatory Visit (INDEPENDENT_AMBULATORY_CARE_PROVIDER_SITE_OTHER): Payer: Medicare Other | Admitting: Nurse Practitioner

## 2020-11-03 ENCOUNTER — Encounter: Payer: Self-pay | Admitting: Nurse Practitioner

## 2020-11-03 ENCOUNTER — Other Ambulatory Visit: Payer: Self-pay

## 2020-11-03 VITALS — BP 118/72 | HR 67 | Ht 64.0 in | Wt 232.0 lb

## 2020-11-03 DIAGNOSIS — K449 Diaphragmatic hernia without obstruction or gangrene: Secondary | ICD-10-CM | POA: Diagnosis not present

## 2020-11-03 DIAGNOSIS — K219 Gastro-esophageal reflux disease without esophagitis: Secondary | ICD-10-CM

## 2020-11-03 DIAGNOSIS — K8500 Idiopathic acute pancreatitis without necrosis or infection: Secondary | ICD-10-CM

## 2020-11-03 NOTE — Progress Notes (Signed)
ASSESSMENT AND PLAN    #70 year old female with episodic epigastric pain, nausea and vomiting over the last 15 months.  Diagnosed with acute pancreatitis during most recent episode but suspect her reoccurring symptoms are more likely related to her large hiatal hernia.   --Regarding the large hiatal hernia, we referred her to [facility] but she instead saw General Surgery at [facility] on 11/01/2020 and plan is for hiatal hernia repair without fundoplication on 05/27/29.  --Regarding pancreatitis, symptoms have resolved. Etiology is not clear. Ultrasound negative for cholelithiasis.  She does not drink alcohol, triglycerides normal.  Calcium normal.  She is on at least 3 medications which can be associated with pancreatitis including furosemide, losartan and a statin but the connection can be difficult to prove.   HISTORY OF PRESENT ILLNESS     Primary Gastroenterologist :  [name], MD  Chief Complaint : follow up on pancreatitis   Kristin [name] is a 70 y.o. female who I saw 10/07/2020 for evaluation of episodic epigastric pain with nausea and vomiting.  She had had 6 episodes within the prior 14 months.  She had a known large hiatal hernia but in the ED prior to our visit she also had a mildly elevated lipase.  At the time of our visit I arrange for CT scan of the abdomen and pelvis.  That evening patient called with worsening pain, nausea and vomiting.  She was sent to the ED where CT scan was expedited and showed acute pancreatitis as well as a large hiatal hernia containing the majority of the stomach, etiology of pancreatitis was unclear.  Her triglycerides have historically been normal.  She does not drink alcohol.  Calcium normal.  Some of her medications could be culprit.  After the patient was feeling better I arrange for an ultrasound to evaluate for gallstones but none was seen.  She was also sent for an upper GI series to further evaluate the large hiatal hernia.  Nearly the whole stomach was  seen above the left diaphragm.  No gastric volvulus or obstructive process.   Patient is here for follow-up.  No further abdominal pain but she is scared to eat for fear of recurrent pain.  Her weight is stable.   Previous Endoscopic Evaluations / Pertinent Studies:     February 2016 abdominal ultrasound IMPRESSION: 1. Increased liver echogenicity commonly represents hepatic steatosis. 2. No biliary duct dilatation. 3. Pancreas not well visualized by ultrasound imaging. Large body habitus.  February 2016 upper GI series IMPRESSION: 1. Large hiatal hernia with the near entirety of the stomach above the LEFT hemidiaphragm. Only the distal antrum and pyloric region are below the diaphragm. 2. No evidence of gastric volvulus. No evidence of obstruction as contrast quickly flowed through the esophagus, stomach, pyloric region and duodenum. 3. No obstruction in the esophagus. No esophageal dysmotility. Mild tortuosity. 4. A 13 mm barium tablet passed GE junction  February 2016 CT scan of the abdomen and pelvis with contrast IMPRESSION: 1. Acute pancreatitis.  No pseudocyst formation. 2. Large hiatal hernia containing the majority of the stomach. No associated bowel obstruction or findings to suggest of ischemia  Past Medical History:  Diagnosis Date  . Abnormal vaginal Pap smear   . Acute pharyngitis 02/25/2013  . Anemia   . Anxiety   . Arthritis   . 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 Chicken pox as a child  . Chronic UTI    sees  dr Terance Hart  . Constipation 12/07/2015  . Depression with anxiety 11/02/2009   Qualifier: Diagnosis of  By: [name], LPN, [name]   . Dermatitis 07/17/2012  . Esophageal stricture 1994  . Fibroids   . Foot pain, bilateral 06/19/2012  . GERD (gastroesophageal reflux disease)   . GERD (gastroesophageal reflux disease)   . Hiatal hernia   . Hyperglycemia 08/19/2013  . 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 Hyperlipidemia   .  Hypertension   . Infertility, female   . Low back pain 10/17/2007   Qualifier: Diagnosis of  By: [name], [name]   . Measles as a child  . Obesity   . Osteoarthritis   . Parkinson disease ([place])   . Plantar fasciitis of left foot 06/19/2012  . Preventative health care 12/19/2015  . Rheumatoid arthritis ([place]) 01/08/2018  . Rosacea 10/05/2014  . Urinary frequency 02/25/2013  . Visual floaters 05/11/2014    Current Medications, Allergies, Past Surgical History, Family History and Social History were reviewed in [employer] record.   Current Outpatient Medications  Medication Sig Dispense Refill  . B-D TB SYRINGE 1CC/27GX1/2" 27G X 1/2" 1 ML MISC     . carbidopa-levodopa (PARCOPA) 25-100 MG disintegrating tablet Take 1 tablet by mouth 5 (five) times daily.    . carbidopa-levodopa (SINEMET CR) 50-200 MG tablet TAKE 1 TABLET BY MOUTH EVERYDAY AT BEDTIME 90 tablet 0  . carvedilol (COREG) 3.125 MG tablet Take 1 tablet (3.125 mg total) by mouth 2 (two) times daily with a meal. 180 tablet 1  . Cholecalciferol (VITAMIN D3) 50 MCG (2000 UT) TABS Take 1 capsule by mouth daily. Take daily everyday except Thursdays    . DULoxetine (CYMBALTA) 30 MG capsule Take 1 capsule (30 mg total) by mouth daily. 90 capsule 1  . escitalopram (LEXAPRO) 20 MG tablet Take 1 tablet (20 mg total) by mouth daily. 90 tablet 1  . famotidine (PEPCID) 40 MG tablet TAKE 1 TABLET BY MOUTH EVERYDAY AT BEDTIME 90 tablet 1  . Ferrous Fumarate-Folic Acid (HEMATINIC/FOLIC ACID) 614-4 MG TABS Take 1 tablet by mouth daily. 90 tablet 3  . folic acid (FOLVITE) 1 MG tablet Take 1 mg by mouth 3 (three) times daily.     . furosemide (LASIX) 20 MG tablet Take 20 mg by mouth See admin instructions. Take on Tuesday and Saturday    . glycerin adult 2 g suppository Place 1 suppository rectally as needed for constipation. 15 suppository 0  . hydroxychloroquine (PLAQUENIL) 200 MG tablet Take 2 tablets (400 mg total) by mouth  daily.    Marland Kitchen losartan (COZAAR) 50 MG tablet TAKE 1 TABLET BY MOUTH EVERY DAY 90 tablet 3  . methotrexate 50 MG/2ML injection Inject 20 mg into the skin once a week.    Marland Kitchen omeprazole (PRILOSEC) 40 MG capsule Take 1 capsule (40 mg total) by mouth daily. 90 capsule 3  . ondansetron (ZOFRAN) 4 MG tablet Take 4 mg by mouth as needed.    . polyethylene glycol (MIRALAX / GLYCOLAX) 17 g packet Take 17 g by mouth every other day.    . pramipexole (MIRAPEX) 0.5 MG tablet TAKE 1 TABLET (0.5 MG TOTAL) BY MOUTH 3 (THREE) TIMES DAILY. 270 tablet 0  . rosuvastatin (CRESTOR) 10 MG tablet TAKE 1 TABLET BY MOUTH ON MONDAYS, WEDNESDAYS AND FRIDAYS 54 tablet 1  . Vitamin D, Ergocalciferol, (DRISDOL) 1.25 MG (50000 UNIT) CAPS capsule TAKE 1 CAPSULE (50,000 UNITS TOTAL) BY MOUTH EVERY 7 (SEVEN) DAYS. 12  capsule 1  . sucralfate (CARAFATE) 1 g tablet Take 1 tablet (1 g total) by mouth every 6 (six) hours as needed. (Patient not taking: Reported on 11/03/2020) 30 tablet 3   No current facility-administered medications for this visit.    Review of Systems: No chest pain. No shortness of breath. No urinary complaints.   PHYSICAL EXAM :    Wt Readings from Last 3 Encounters:  11/03/20 232 lb (105.2 kg)  10/28/20 234 lb 6.4 oz (106.3 kg)  10/18/20 235 lb (106.6 kg)    BP 118/72   Pulse 67   Ht 5\' 4"  (1.626 m)   Wt 232 lb (105.2 kg)   SpO2 98%   BMI 39.82 kg/m  Constitutional:  Pleasant female in no acute distress. Psychiatric: Normal mood and affect. Behavior is normal. EENT: Pupils normal.  Conjunctivae are normal. No scleral icterus. Neck supple.  Cardiovascular: Normal rate, regular rhythm. No edema Pulmonary/chest: Effort normal and breath sounds normal. No wheezing, rales or rhonchi. Abdominal: Soft, nondistended, nontender. Bowel sounds active throughout. There are no masses palpable. No hepatomegaly. Neurological: Alert and oriented to person place and time. Skin: Skin is warm and dry. No rashes  noted.  Tye Savoy, NP  11/03/2020, 9:32 AM

## 2020-11-03 NOTE — Patient Instructions (Addendum)
If you are age 71 or older, your body mass index should be between 23-30. Your Body mass index is 39.82 kg/m. If this is out of the aforementioned range listed, please consider follow up with your Primary Care Provider.  RECOMMENDATIONS:  Stop taking Pepcid at night.  Follow up with [place] as needed.  It was great seeing you today! Thank you for entrusting me with your care and choosing [facility].  Tye Savoy, NP

## 2020-11-04 ENCOUNTER — Encounter: Payer: Self-pay | Admitting: Nurse Practitioner

## 2020-11-05 NOTE — Progress Notes (Signed)
Reviewed and agree with management plan.  Jamison Yuhasz T. Kemontae Dunklee, MD FACG [phone]  

## 2020-11-08 ENCOUNTER — Other Ambulatory Visit: Payer: Self-pay | Admitting: Neurology

## 2020-11-26 DIAGNOSIS — M5441 Lumbago with sciatica, right side: Secondary | ICD-10-CM | POA: Diagnosis not present

## 2020-11-26 DIAGNOSIS — I10 Essential (primary) hypertension: Secondary | ICD-10-CM | POA: Diagnosis not present

## 2020-11-26 DIAGNOSIS — G20 Parkinson's disease: Secondary | ICD-10-CM | POA: Diagnosis not present

## 2020-11-26 DIAGNOSIS — M48061 Spinal stenosis, lumbar region without neurogenic claudication: Secondary | ICD-10-CM | POA: Diagnosis not present

## 2020-11-26 DIAGNOSIS — G8929 Other chronic pain: Secondary | ICD-10-CM | POA: Diagnosis not present

## 2020-11-26 DIAGNOSIS — Z6839 Body mass index (BMI) 39.0-39.9, adult: Secondary | ICD-10-CM | POA: Diagnosis not present

## 2020-11-26 DIAGNOSIS — M5416 Radiculopathy, lumbar region: Secondary | ICD-10-CM | POA: Diagnosis not present

## 2020-12-05 ENCOUNTER — Other Ambulatory Visit: Payer: Self-pay | Admitting: Neurology

## 2020-12-07 ENCOUNTER — Ambulatory Visit (INDEPENDENT_AMBULATORY_CARE_PROVIDER_SITE_OTHER): Payer: Medicare Other | Admitting: Family Medicine

## 2020-12-07 ENCOUNTER — Encounter: Payer: Self-pay | Admitting: Family Medicine

## 2020-12-07 ENCOUNTER — Other Ambulatory Visit: Payer: Self-pay

## 2020-12-07 VITALS — BP 128/84 | HR 80 | Temp 98.5°F | Resp 16 | Wt 232.4 lb

## 2020-12-07 DIAGNOSIS — E559 Vitamin D deficiency, unspecified: Secondary | ICD-10-CM | POA: Diagnosis not present

## 2020-12-07 DIAGNOSIS — D509 Iron deficiency anemia, unspecified: Secondary | ICD-10-CM | POA: Diagnosis not present

## 2020-12-07 DIAGNOSIS — M069 Rheumatoid arthritis, unspecified: Secondary | ICD-10-CM | POA: Diagnosis not present

## 2020-12-07 DIAGNOSIS — Z79899 Other long term (current) drug therapy: Secondary | ICD-10-CM | POA: Diagnosis not present

## 2020-12-07 DIAGNOSIS — K859 Acute pancreatitis without necrosis or infection, unspecified: Secondary | ICD-10-CM

## 2020-12-07 DIAGNOSIS — K219 Gastro-esophageal reflux disease without esophagitis: Secondary | ICD-10-CM | POA: Diagnosis not present

## 2020-12-07 DIAGNOSIS — G20 Parkinson's disease: Secondary | ICD-10-CM | POA: Diagnosis not present

## 2020-12-07 DIAGNOSIS — E782 Mixed hyperlipidemia: Secondary | ICD-10-CM | POA: Diagnosis not present

## 2020-12-07 DIAGNOSIS — K449 Diaphragmatic hernia without obstruction or gangrene: Secondary | ICD-10-CM

## 2020-12-07 DIAGNOSIS — M5416 Radiculopathy, lumbar region: Secondary | ICD-10-CM

## 2020-12-07 DIAGNOSIS — G20A1 Parkinson's disease without dyskinesia, without mention of fluctuations: Secondary | ICD-10-CM

## 2020-12-07 DIAGNOSIS — R7303 Prediabetes: Secondary | ICD-10-CM | POA: Diagnosis not present

## 2020-12-07 DIAGNOSIS — I10 Essential (primary) hypertension: Secondary | ICD-10-CM

## 2020-12-07 DIAGNOSIS — D519 Vitamin B12 deficiency anemia, unspecified: Secondary | ICD-10-CM | POA: Diagnosis not present

## 2020-12-07 LAB — CBC WITH DIFFERENTIAL/PLATELET
Basophils Absolute: 0 10*3/uL (ref 0.0–0.1)
Basophils Relative: 0.7 % (ref 0.0–3.0)
Eosinophils Absolute: 0.1 10*3/uL (ref 0.0–0.7)
Eosinophils Relative: 1 % (ref 0.0–5.0)
HCT: 39.3 % (ref 36.0–46.0)
Hemoglobin: 13.2 g/dL (ref 12.0–15.0)
Lymphocytes Relative: 22.1 % (ref 12.0–46.0)
Lymphs Abs: 1.3 10*3/uL (ref 0.7–4.0)
MCHC: 33.5 g/dL (ref 30.0–36.0)
MCV: 90.2 fl (ref 78.0–100.0)
Monocytes Absolute: 0.5 10*3/uL (ref 0.1–1.0)
Monocytes Relative: 8 % (ref 3.0–12.0)
Neutro Abs: 4.2 10*3/uL (ref 1.4–7.7)
Neutrophils Relative %: 68.2 % (ref 43.0–77.0)
Platelets: 207 10*3/uL (ref 150.0–400.0)
RBC: 4.35 Mil/uL (ref 3.87–5.11)
RDW: 14.4 % (ref 11.5–15.5)
WBC: 6.1 10*3/uL (ref 4.0–10.5)

## 2020-12-07 LAB — COMPREHENSIVE METABOLIC PANEL
ALT: 6 U/L (ref 0–35)
AST: 16 U/L (ref 0–37)
Albumin: 4.3 g/dL (ref 3.5–5.2)
Alkaline Phosphatase: 71 U/L (ref 39–117)
BUN: 16 mg/dL (ref 6–23)
CO2: 28 mEq/L (ref 19–32)
Calcium: 9.7 mg/dL (ref 8.4–10.5)
Chloride: 105 mEq/L (ref 96–112)
Creatinine, Ser: 0.68 mg/dL (ref 0.40–1.20)
GFR: 88.14 mL/min (ref >60.00)
Glucose, Bld: 100 mg/dL — ABNORMAL HIGH (ref 70–99)
Potassium: 4.5 mEq/L (ref 3.5–5.1)
Sodium: 142 mEq/L (ref 135–145)
Total Bilirubin: 0.9 mg/dL (ref 0.2–1.2)
Total Protein: 6.6 g/dL (ref 6.0–8.3)

## 2020-12-07 LAB — LIPID PANEL
Cholesterol: 155 mg/dL (ref 0–200)
HDL: 67.2 mg/dL (ref >39.00)
LDL Cholesterol: 66 mg/dL (ref 0–99)
NonHDL: 87.53
Total CHOL/HDL Ratio: 2
Triglycerides: 108 mg/dL (ref 0.0–149.0)
VLDL: 21.6 mg/dL (ref 0.0–40.0)

## 2020-12-07 LAB — HEMOGLOBIN A1C: Hgb A1c MFr Bld: 5.8 % (ref 4.6–6.5)

## 2020-12-07 LAB — TSH: TSH: 1.15 u[IU]/mL (ref 0.35–4.50)

## 2020-12-07 LAB — AMYLASE: Amylase: 33 U/L (ref 27–131)

## 2020-12-07 LAB — LIPASE: Lipase: 14 U/L (ref 11.0–59.0)

## 2020-12-07 LAB — VITAMIN B12: Vitamin B-12: 162 pg/mL — ABNORMAL LOW (ref 211–911)

## 2020-12-07 MED ORDER — TIZANIDINE HCL 4 MG PO TABS: 2.0000 mg | ORAL_TABLET | Freq: Two times a day (BID) | ORAL | 0 refills | Status: DC | PRN

## 2020-12-07 NOTE — Assessment & Plan Note (Addendum)
Encouraged moist heat and gentle stretching as tolerated. May try NSAIDs and prescription meds as directed and report if symptoms worsen or seek immediate care. This low back pain and radicular symptoms down right leg, no fall or trauma. She saw neurosurgery PA at Dr Melven Sartorius office and they put her on Tramadol, Hydrocodone and Robaxin but they have not been helpful. She is working with them to get an MRI to further investigate.

## 2020-12-07 NOTE — Assessment & Plan Note (Signed)
Continues to follow with neurology

## 2020-12-07 NOTE — Assessment & Plan Note (Signed)
Follows with Rheumatology

## 2020-12-07 NOTE — Assessment & Plan Note (Signed)
hgba1c acceptable, minimize simple carbs. Increase exercise as tolerated.  

## 2020-12-07 NOTE — Assessment & Plan Note (Addendum)
Encouraged heart healthy diet, increase exercise, avoid trans fats, consider a krill oil cap daily. Tolerating Rosuvastatin 

## 2020-12-07 NOTE — Progress Notes (Signed)
Patient ID: Kristin [name], female    DOB: [birth year]  Age: 70 y.o. MRN: [id number]    Subjective:  Subjective  HPI Kristin [name] presents for office visit today. She states that she has hurt her right hip. She reports that it started on 3/18 when she tried to reach the fridge to grab something. She states that she went to a specialist to have it treated and was prescribed pain medications to help relieve the pain. She denies any SOB, fever, abdominal pain, cough, chills, sore throat, dysuria, urinary incontinence, HA, or N/VD.   She reports that she had experienced 4 pain attacks that started 6 months ago, which usually lasts for 2 days and she was later dx with hiatal hernia. She states that she was referred to a surgeon in [place] and she is scheduled for surgery 01/11/2021, but the surgeon Dr. Diona Browner stated that the patient has to be able to walk before surgery with out back problems. She expresses interest in pursuing physical therapy like water aerobics to help improve her back issues. She states that walking or laying down makes the back pain worse. She states that she attempts to walk with her cane while maintaining breathing exercises to help her walk. She expresses her concern regarding the amount of medications she takes. She states that since her past visit to the ED there has been changes and additions to her medications list. She reports that her hair has been falling out.  Review of Systems  Constitutional: Negative for chills, fatigue and fever.  HENT: Negative for congestion, rhinorrhea, sinus pressure, sinus pain and sore throat.   Eyes: Negative for pain.  Respiratory: Negative for cough and shortness of breath.   Cardiovascular: Negative for chest pain, palpitations and leg swelling.  Gastrointestinal: Positive for constipation. Negative for abdominal pain, blood in stool, diarrhea, nausea and vomiting.  Genitourinary: Negative for decreased urine volume, flank pain, frequency,  vaginal bleeding and vaginal discharge.  Musculoskeletal: Positive for back pain.       (+) right hip pain  Neurological: Negative for headaches.    History Past Medical History:  Diagnosis Date  . Abnormal vaginal Pap smear   . Acute pharyngitis 02/25/2013  . Anemia   . Anxiety   . Arthritis   . 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 Chicken pox as a child  . Chronic UTI    sees dr Terance Hart  . Constipation 12/07/2015  . Depression with anxiety 11/02/2009   Qualifier: Diagnosis of  By: [name], LPN, [name]   . Dermatitis 07/17/2012  . Esophageal stricture 1994  . Fibroids   . Foot pain, bilateral 06/19/2012  . GERD (gastroesophageal reflux disease)   . GERD (gastroesophageal reflux disease)   . Hiatal hernia   . Hyperglycemia 08/19/2013  . 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 Hyperlipidemia   . Hypertension   . Infertility, female   . Low back pain 10/17/2007   Qualifier: Diagnosis of  By: [name], [name]   . Measles as a child  . Obesity   . Osteoarthritis   . Parkinson disease ([place])   . Plantar fasciitis of left foot 06/19/2012  . Preventative health care 12/19/2015  . Rheumatoid arthritis ([place]) 01/08/2018  . Rosacea 10/05/2014  . Swallowing difficulty   . Urinary frequency 02/25/2013  . Visual floaters 05/11/2014    She has a past surgical history that includes wisdom teeth extracted; laporoscopy; Tonsillectomy; Abdominal hysterectomy (2006); esophageal;  Total hip arthroplasty (Right, 2010); and LEFT HEART CATH AND CORONARY ANGIOGRAPHY (N/A, 06/02/2019).   Her family history includes Arthritis in her father, sister, sister, and sister; Bipolar disorder in her mother; Cancer in her mother; Depression in her mother and sister; Heart disease in her father; Hyperlipidemia in her father and mother; Hypertension in her father; Mental illness in her mother and sister; Other in her mother; Parkinson's disease in her sister; Thyroid disease in her mother.She  reports that she has never smoked. She has never used smokeless tobacco. She reports previous alcohol use. She reports that she does not use drugs.  Current Outpatient Medications on File Prior to Visit  Medication Sig Dispense Refill  . B-D TB SYRINGE 1CC/27GX1/2" 27G X 1/2" 1 ML MISC     . carbidopa-levodopa (PARCOPA) 25-100 MG disintegrating tablet Take 1 tablet by mouth 5 (five) times daily.    . carbidopa-levodopa (SINEMET CR) 50-200 MG tablet TAKE 1 TABLET BY MOUTH EVERYDAY AT BEDTIME 90 tablet 0  . carvedilol (COREG) 3.125 MG tablet Take 1 tablet (3.125 mg total) by mouth 2 (two) times daily with a meal. 180 tablet 1  . DULoxetine (CYMBALTA) 30 MG capsule Take 1 capsule (30 mg total) by mouth daily. 90 capsule 1  . escitalopram (LEXAPRO) 20 MG tablet Take 1 tablet (20 mg total) by mouth daily. 90 tablet 1  . folic acid (FOLVITE) 1 MG tablet Take 1 mg by mouth 3 (three) times daily.     Marland Kitchen glycerin adult 2 g suppository Place 1 suppository rectally as needed for constipation. 15 suppository 0  . HYDROcodone-acetaminophen (NORCO/VICODIN) 5-325 MG tablet Take 1 tablet by mouth every 6 (six) hours as needed.    . hydroxychloroquine (PLAQUENIL) 200 MG tablet Take 2 tablets (400 mg total) by mouth daily.    Marland Kitchen losartan (COZAAR) 50 MG tablet TAKE 1 TABLET BY MOUTH EVERY DAY 90 tablet 3  . methotrexate 50 MG/2ML injection Inject 20 mg into the skin once a week.    . polyethylene glycol (MIRALAX / GLYCOLAX) 17 g packet Take 17 g by mouth every other day.    . pramipexole (MIRAPEX) 0.5 MG tablet TAKE 1 TABLET (0.5 MG TOTAL) BY MOUTH 3 (THREE) TIMES DAILY. 270 tablet 0  . traMADol (ULTRAM) 50 MG tablet Take 50 mg by mouth every 6 (six) hours as needed.    . Cholecalciferol (VITAMIN D3) 50 MCG (2000 UT) TABS Take 1 capsule by mouth daily. Take daily everyday except Thursdays (Patient not taking: Reported on 12/07/2020)    . famotidine (PEPCID) 40 MG tablet TAKE 1 TABLET BY MOUTH EVERYDAY AT BEDTIME  (Patient not taking: Reported on 12/07/2020) 90 tablet 1  . Ferrous Fumarate-Folic Acid (HEMATINIC/FOLIC ACID) 585-2 MG TABS Take 1 tablet by mouth daily. (Patient not taking: Reported on 12/07/2020) 90 tablet 3  . furosemide (LASIX) 20 MG tablet Take 20 mg by mouth See admin instructions. Take on Tuesday and Saturday (Patient not taking: Reported on 12/07/2020)    . rosuvastatin (CRESTOR) 10 MG tablet TAKE 1 TABLET BY MOUTH ON MONDAYS, WEDNESDAYS AND FRIDAYS 54 tablet 1   No current facility-administered medications on file prior to visit.     Objective:  Objective  Physical Exam Constitutional:      General: She is not in acute distress.    Appearance: Normal appearance. She is not ill-appearing or toxic-appearing.  HENT:     Head: Normocephalic and atraumatic.     Right Ear: Tympanic membrane, ear canal  and external ear normal.     Left Ear: Tympanic membrane, ear canal and external ear normal.     Nose: No congestion or rhinorrhea.  Eyes:     Extraocular Movements: Extraocular movements intact.     Pupils: Pupils are equal, round, and reactive to light.  Cardiovascular:     Rate and Rhythm: Normal rate and regular rhythm.     Pulses: Normal pulses.     Heart sounds: Normal heart sounds. No murmur heard.   Pulmonary:     Effort: Pulmonary effort is normal. No respiratory distress.     Breath sounds: Normal breath sounds. No wheezing, rhonchi or rales.  Abdominal:     General: Bowel sounds are normal.     Palpations: Abdomen is soft. There is no mass.     Tenderness: There is no abdominal tenderness. There is no guarding.     Hernia: No hernia is present.  Musculoskeletal:        General: Normal range of motion.     Cervical back: Normal range of motion and neck supple.  Skin:    General: Skin is warm and dry.  Neurological:     Mental Status: She is alert and oriented to person, place, and time.  Psychiatric:        Behavior: Behavior normal.    BP 128/84   Pulse 80    Temp 98.5 F (36.9 C)   Resp 16   Wt 232 lb 6.4 oz (105.4 kg)   SpO2 94%   BMI 39.89 kg/m  Wt Readings from Last 3 Encounters:  12/10/20 235 lb (106.6 kg)  12/07/20 232 lb 6.4 oz (105.4 kg)  11/03/20 232 lb (105.2 kg)     Lab Results  Component Value Date   WBC 10.1 12/10/2020   HGB 14.0 12/10/2020   HCT 41.3 12/10/2020   PLT 246 12/10/2020   GLUCOSE 121 (H) 12/10/2020   CHOL 155 12/07/2020   TRIG 108.0 12/07/2020   HDL 67.20 12/07/2020   LDLDIRECT 161.5 10/07/2008   LDLCALC 66 12/07/2020   ALT 20 12/10/2020   AST 19 12/10/2020   NA 144 12/10/2020   K 3.4 (L) 12/10/2020   CL 102 12/10/2020   CREATININE 0.85 12/10/2020   BUN 20 12/10/2020   CO2 28 12/10/2020   TSH 1.15 12/07/2020   INR 2.5 (H) 01/19/2009   HGBA1C 5.8 12/07/2020    DG UGI W SINGLE CM (SOL OR THIN BA)  Result Date: 10/20/2020 CLINICAL DATA:  Chest pain.  Large hiatal hernia. EXAM: UPPER GI SERIES WITH KUB TECHNIQUE: After obtaining a scout radiograph a routine upper GI series was performed using thin barium FLUOROSCOPY TIME:  Fluoroscopy Time:  1 minutes 24 seconds Radiation Exposure Index (if provided by the fluoroscopic device): 52.9 Number of Acquired Spot Images: 5 COMPARISON:  CT 10/08/2020 FINDINGS: No swallowing dysfunction in the high cervical esophagus. No mucosal irregularity in the thoracic esophagus or distal esophagus. Mild tortuosity of the esophagus. Large hiatal hernia with the near entire stomach above the hemidiaphragms. Only the distal antrum antrum and pyloric region of the stomach are below the diaphragm. Contrast flows readily through the stomach into the pyloric region. Contrast quickly flows through the duodenum and ligament Treitz which is in the expected location LEFT of the spine. There is no mucosal irregularity or mass within the stomach. No evidence of gastric volvulus. IMPRESSION: 1. Large hiatal hernia with the near entirety of the stomach above the LEFT hemidiaphragm. Only the  distal antrum and pyloric region are below the diaphragm. 2. No evidence of gastric volvulus. No evidence of obstruction as contrast quickly flowed through the esophagus, stomach, pyloric region and duodenum. 3. No obstruction in the esophagus. No esophageal dysmotility. Mild tortuosity. 4. A 13 mm barium tablet passed GE junction. Electronically Signed   By: Suzy Bouchard M.D.   On: 10/20/2020 11:19     Assessment & Plan:  Plan    Meds ordered this encounter  Medications  . tiZANidine (ZANAFLEX) 4 MG tablet    Sig: Take 0.5-1 tablets (2-4 mg total) by mouth 2 (two) times daily as needed for muscle spasms.    Dispense:  30 tablet    Refill:  0    Problem List Items Addressed This Visit    Hyperlipidemia    Encouraged heart healthy diet, increase exercise, avoid trans fats, consider a krill oil cap daily. Tolerating Rosuvastatin      Relevant Orders   Lipid panel (Completed)   Essential hypertension    Well controlled, no changes to meds. Encouraged heart healthy diet such as the DASH diet and exercise as tolerated.       Relevant Orders   CBC with Differential/Platelet (Completed)   Comprehensive metabolic panel (Completed)   TSH (Completed)   Hiatal hernia with GERD    Has had 6 attacks of pain some lasting an hour, the last episode was 2/3 and lasted 2 days. It has worsened and she is now set up with a surgeon at [facility] but they have to wait til her back is better.       Lumbar back pain with radiculopathy affecting lower extremity    Encouraged moist heat and gentle stretching as tolerated. May try NSAIDs and prescription meds as directed and report if symptoms worsen or seek immediate care. This low back pain and radicular symptoms down right leg, no fall or trauma. She saw neurosurgery PA at Dr Melven Sartorius office and they put her on Tramadol, Hydrocodone and Robaxin but they have not been helpful. She is working with them to get an MRI to further investigate.       Relevant  Medications   traMADol (ULTRAM) 50 MG tablet   HYDROcodone-acetaminophen (NORCO/VICODIN) 5-325 MG tablet   tiZANidine (ZANAFLEX) 4 MG tablet   Parkinson's disease ([place])    Continues to follow with neurology      Vitamin D deficiency    Supplement and monitor      Relevant Orders   Vitamin D 1,25 dihydroxy (Completed)   Prediabetes    hgba1c acceptable, minimize simple carbs. Increase exercise as tolerated.       Relevant Orders   Hemoglobin A1c (Completed)   Rheumatoid arthritis (HCC)    Follows with Rheumatology      Relevant Medications   traMADol (ULTRAM) 50 MG tablet   HYDROcodone-acetaminophen (NORCO/VICODIN) 5-325 MG tablet   tiZANidine (ZANAFLEX) 4 MG tablet   Anemia   Relevant Orders   Vitamin B12 (Completed)   Iron, TIBC and Ferritin Panel (Completed)    Other Visit Diagnoses    Acute pancreatitis, unspecified complication status, unspecified pancreatitis type    -  Primary   Relevant Medications   traMADol (ULTRAM) 50 MG tablet   HYDROcodone-acetaminophen (NORCO/VICODIN) 5-325 MG tablet   Other Relevant Orders   Amylase (Completed)   Lipase (Completed)   High risk medication use       Relevant Orders   Vitamin B12 (Completed)      Follow-up: No  follow-ups on file.   I,David Hanna,acting as a scribe for [name], MD.,have documented all relevant documentation on the behalf of [name], MD,as directed by  [name], MD while in the presence of [name], MD.  I, Mosie Lukes, MD personally performed the services described in this documentation. All medical record entries made by the scribe were at my direction and in my presence. I have reviewed the chart and agree that the record reflects my personal performance and is accurate and complete

## 2020-12-07 NOTE — Assessment & Plan Note (Signed)
Well controlled, no changes to meds. Encouraged heart healthy diet such as the DASH diet and exercise as tolerated.  °

## 2020-12-07 NOTE — Assessment & Plan Note (Signed)
Has had 6 attacks of pain some lasting an hour, the last episode was 2/3 and lasted 2 days. It has worsened and she is now set up with a surgeon at [facility] but they have to wait til her back is better.

## 2020-12-07 NOTE — Assessment & Plan Note (Signed)
Supplement and monitor 

## 2020-12-07 NOTE — Patient Instructions (Signed)
Acute Back Pain, Adult Acute back pain is sudden and usually short-lived. It is often caused by an injury to the muscles and tissues in the back. The injury may result from:  A muscle or ligament getting overstretched or torn (strained). Ligaments are tissues that connect bones to each other. Lifting something improperly can cause a back strain.  Wear and tear (degeneration) of the spinal disks. Spinal disks are circular tissue that provide cushioning between the bones of the spine (vertebrae).  Twisting motions, such as while playing sports or doing yard work.  A hit to the back.  Arthritis. You may have a physical exam, lab tests, and imaging tests to find the cause of your pain. Acute back pain usually goes away with rest and home care. Follow these instructions at home: Managing pain, stiffness, and swelling  Treatment may include medicines for pain and inflammation that are taken by mouth or applied to the skin, prescription pain medicine, or muscle relaxants. Take over-the-counter and prescription medicines only as told by your health care provider.  Your health care provider may recommend applying ice during the first 24-48 hours after your pain starts. To do this: ? Put ice in a plastic bag. ? Place a towel between your skin and the bag. ? Leave the ice on for 20 minutes, 2-3 times a day.  If directed, apply heat to the affected area as often as told by your health care provider. Use the heat source that your health care provider recommends, such as a moist heat pack or a heating pad. ? Place a towel between your skin and the heat source. ? Leave the heat on for 20-30 minutes. ? Remove the heat if your skin turns bright red. This is especially important if you are unable to feel pain, heat, or cold. You have a greater risk of getting burned. Activity  Do not stay in bed. Staying in bed for more than 1-2 days can delay your recovery.  Sit up and stand up straight. Avoid leaning  forward when you sit or hunching over when you stand. ? If you work at a desk, sit close to it so you do not need to lean over. Keep your chin tucked in. Keep your neck drawn back, and keep your elbows bent at a 90-degree angle (right angle). ? Sit high and close to the steering wheel when you drive. Add lower back (lumbar) support to your car seat, if needed.  Take short walks on even surfaces as soon as you are able. Try to increase the length of time you walk each day.  Do not sit, drive, or stand in one place for more than 30 minutes at a time. Sitting or standing for long periods of time can put stress on your back.  Do not drive or use heavy machinery while taking prescription pain medicine.  Use proper lifting techniques. When you bend and lift, use positions that put less stress on your back: ? Bend your knees. ? Keep the load close to your body. ? Avoid twisting.  Exercise regularly as told by your health care provider. Exercising helps your back heal faster and helps prevent back injuries by keeping muscles strong and flexible.  Work with a physical therapist to make a safe exercise program, as recommended by your health care provider. Do any exercises as told by your physical therapist.   Lifestyle  Maintain a healthy weight. Extra weight puts stress on your back and makes it difficult to have   good posture.  Avoid activities or situations that make you feel anxious or stressed. Stress and anxiety increase muscle tension and can make back pain worse. Learn ways to manage anxiety and stress, such as through exercise. General instructions  Sleep on a firm mattress in a comfortable position. Try lying on your side with your knees slightly bent. If you lie on your back, put a pillow under your knees.  Follow your treatment plan as told by your health care provider. This may include: ? Cognitive or behavioral therapy. ? Acupuncture or massage therapy. ? Meditation or yoga. Contact  a health care provider if:  You have pain that is not relieved with rest or medicine.  You have increasing pain going down into your legs or buttocks.  Your pain does not improve after 2 weeks.  You have pain at night.  You lose weight without trying.  You have a fever or chills. Get help right away if:  You develop new bowel or bladder control problems.  You have unusual weakness or numbness in your arms or legs.  You develop nausea or vomiting.  You develop abdominal pain.  You feel faint. Summary  Acute back pain is sudden and usually short-lived.  Use proper lifting techniques. When you bend and lift, use positions that put less stress on your back.  Take over-the-counter and prescription medicines and apply heat or ice as directed by your health care provider. This information is not intended to replace advice given to you by your health care provider. Make sure you discuss any questions you have with your health care provider. Document Revised: 05/14/2020 Document Reviewed: 05/14/2020 Elsevier Patient Education  2021 [employer]  

## 2020-12-10 ENCOUNTER — Telehealth: Payer: Self-pay | Admitting: Nurse Practitioner

## 2020-12-10 ENCOUNTER — Emergency Department (HOSPITAL_BASED_OUTPATIENT_CLINIC_OR_DEPARTMENT_OTHER)
Admission: EM | Admit: 2020-12-10 | Discharge: 2020-12-10 | Disposition: A | Discharge status: Other Acute Inpatient Hospital | Payer: Medicare Other | Attending: Emergency Medicine | Admitting: Emergency Medicine

## 2020-12-10 ENCOUNTER — Other Ambulatory Visit: Payer: Self-pay

## 2020-12-10 ENCOUNTER — Emergency Department (HOSPITAL_BASED_OUTPATIENT_CLINIC_OR_DEPARTMENT_OTHER): Payer: Medicare Other

## 2020-12-10 ENCOUNTER — Encounter (HOSPITAL_BASED_OUTPATIENT_CLINIC_OR_DEPARTMENT_OTHER): Payer: Self-pay

## 2020-12-10 DIAGNOSIS — Z79899 Other long term (current) drug therapy: Secondary | ICD-10-CM | POA: Insufficient documentation

## 2020-12-10 DIAGNOSIS — Z4682 Encounter for fitting and adjustment of non-vascular catheter: Secondary | ICD-10-CM | POA: Diagnosis not present

## 2020-12-10 DIAGNOSIS — Z881 Allergy status to other antibiotic agents status: Secondary | ICD-10-CM | POA: Diagnosis not present

## 2020-12-10 DIAGNOSIS — F32A Depression, unspecified: Secondary | ICD-10-CM | POA: Diagnosis not present

## 2020-12-10 DIAGNOSIS — Z20822 Contact with and (suspected) exposure to covid-19: Secondary | ICD-10-CM | POA: Diagnosis not present

## 2020-12-10 DIAGNOSIS — K3189 Other diseases of stomach and duodenum: Secondary | ICD-10-CM | POA: Diagnosis not present

## 2020-12-10 DIAGNOSIS — I10 Essential (primary) hypertension: Secondary | ICD-10-CM | POA: Diagnosis not present

## 2020-12-10 DIAGNOSIS — Z4659 Encounter for fitting and adjustment of other gastrointestinal appliance and device: Secondary | ICD-10-CM

## 2020-12-10 DIAGNOSIS — K311 Adult hypertrophic pyloric stenosis: Secondary | ICD-10-CM | POA: Insufficient documentation

## 2020-12-10 DIAGNOSIS — Z90710 Acquired absence of both cervix and uterus: Secondary | ICD-10-CM | POA: Diagnosis not present

## 2020-12-10 DIAGNOSIS — K449 Diaphragmatic hernia without obstruction or gangrene: Secondary | ICD-10-CM

## 2020-12-10 DIAGNOSIS — K219 Gastro-esophageal reflux disease without esophagitis: Secondary | ICD-10-CM | POA: Diagnosis not present

## 2020-12-10 DIAGNOSIS — K562 Volvulus: Secondary | ICD-10-CM | POA: Diagnosis not present

## 2020-12-10 DIAGNOSIS — R109 Unspecified abdominal pain: Secondary | ICD-10-CM | POA: Diagnosis not present

## 2020-12-10 DIAGNOSIS — Z85828 Personal history of other malignant neoplasm of skin: Secondary | ICD-10-CM | POA: Diagnosis not present

## 2020-12-10 DIAGNOSIS — I2510 Atherosclerotic heart disease of native coronary artery without angina pectoris: Secondary | ICD-10-CM | POA: Insufficient documentation

## 2020-12-10 DIAGNOSIS — G20 Parkinson's disease: Secondary | ICD-10-CM | POA: Diagnosis not present

## 2020-12-10 DIAGNOSIS — J9811 Atelectasis: Secondary | ICD-10-CM | POA: Diagnosis not present

## 2020-12-10 DIAGNOSIS — Z880 Allergy status to penicillin: Secondary | ICD-10-CM | POA: Diagnosis not present

## 2020-12-10 DIAGNOSIS — Z96641 Presence of right artificial hip joint: Secondary | ICD-10-CM | POA: Insufficient documentation

## 2020-12-10 DIAGNOSIS — K440 Diaphragmatic hernia with obstruction, without gangrene: Secondary | ICD-10-CM | POA: Diagnosis not present

## 2020-12-10 DIAGNOSIS — R0789 Other chest pain: Secondary | ICD-10-CM | POA: Diagnosis not present

## 2020-12-10 DIAGNOSIS — R140 Abdominal distension (gaseous): Secondary | ICD-10-CM | POA: Diagnosis not present

## 2020-12-10 DIAGNOSIS — R1013 Epigastric pain: Secondary | ICD-10-CM | POA: Diagnosis present

## 2020-12-10 DIAGNOSIS — E785 Hyperlipidemia, unspecified: Secondary | ICD-10-CM | POA: Diagnosis not present

## 2020-12-10 DIAGNOSIS — M069 Rheumatoid arthritis, unspecified: Secondary | ICD-10-CM | POA: Diagnosis not present

## 2020-12-10 DIAGNOSIS — K660 Peritoneal adhesions (postprocedural) (postinfection): Secondary | ICD-10-CM | POA: Diagnosis not present

## 2020-12-10 LAB — COMPREHENSIVE METABOLIC PANEL
ALT: 20 U/L (ref 0–44)
AST: 19 U/L (ref 15–41)
Albumin: 4.8 g/dL (ref 3.5–5.0)
Alkaline Phosphatase: 67 U/L (ref 38–126)
Anion gap: 14 (ref 5–15)
BUN: 20 mg/dL (ref 8–23)
CO2: 28 mmol/L (ref 22–32)
Calcium: 10.1 mg/dL (ref 8.9–10.3)
Chloride: 102 mmol/L (ref 98–111)
Creatinine, Ser: 0.85 mg/dL (ref 0.44–1.00)
GFR, Estimated: >60 mL/min (ref >60)
Glucose, Bld: 121 mg/dL — ABNORMAL HIGH (ref 70–99)
Potassium: 3.4 mmol/L — ABNORMAL LOW (ref 3.5–5.1)
Sodium: 144 mmol/L (ref 135–145)
Total Bilirubin: 1.1 mg/dL (ref 0.3–1.2)
Total Protein: 7.6 g/dL (ref 6.5–8.1)

## 2020-12-10 LAB — CBC WITH DIFFERENTIAL/PLATELET
Abs Immature Granulocytes: 0.03 10*3/uL (ref 0.00–0.07)
Basophils Absolute: 0 10*3/uL (ref 0.0–0.1)
Basophils Relative: 0 %
Eosinophils Absolute: 0 10*3/uL (ref 0.0–0.5)
Eosinophils Relative: 0 %
HCT: 41.3 % (ref 36.0–46.0)
Hemoglobin: 14 g/dL (ref 12.0–15.0)
Immature Granulocytes: 0 %
Lymphocytes Relative: 16 %
Lymphs Abs: 1.6 10*3/uL (ref 0.7–4.0)
MCH: 30.8 pg (ref 26.0–34.0)
MCHC: 33.9 g/dL (ref 30.0–36.0)
MCV: 91 fL (ref 80.0–100.0)
Monocytes Absolute: 0.9 10*3/uL (ref 0.1–1.0)
Monocytes Relative: 9 %
Neutro Abs: 7.5 10*3/uL (ref 1.7–7.7)
Neutrophils Relative %: 75 %
Platelets: 246 10*3/uL (ref 150–400)
RBC: 4.54 MIL/uL (ref 3.87–5.11)
RDW: 13.3 % (ref 11.5–15.5)
WBC: 10.1 10*3/uL (ref 4.0–10.5)
nRBC: 0 % (ref 0.0–0.2)

## 2020-12-10 LAB — LIPASE, BLOOD: Lipase: 53 U/L — ABNORMAL HIGH (ref 11–51)

## 2020-12-10 LAB — TROPONIN I (HIGH SENSITIVITY): Troponin I (High Sensitivity): 7 ng/L (ref <18)

## 2020-12-10 LAB — RESP PANEL BY RT-PCR (FLU A&B, COVID) ARPGX2
Influenza A by PCR: NEGATIVE
Influenza B by PCR: NEGATIVE
SARS Coronavirus 2 by RT PCR: NEGATIVE

## 2020-12-10 MED ORDER — SODIUM CHLORIDE 0.9 % IV BOLUS
1000.0000 mL | Freq: Once | INTRAVENOUS | Status: AC
  Administered 2020-12-10: 1000 mL via INTRAVENOUS

## 2020-12-10 MED ORDER — LIDOCAINE VISCOUS HCL 2 % MT SOLN
15.0000 mL | Freq: Once | OROMUCOSAL | Status: AC
  Administered 2020-12-10: 15 mL via OROMUCOSAL
  Filled 2020-12-10: qty 15

## 2020-12-10 MED ORDER — KETAMINE HCL 10 MG/ML IJ SOLN
15.0000 mg | Freq: Once | INTRAMUSCULAR | Status: AC
  Administered 2020-12-10: 15 mg via INTRAVENOUS
  Filled 2020-12-10: qty 1

## 2020-12-10 MED ORDER — ONDANSETRON HCL 4 MG/2ML IJ SOLN
4.0000 mg | Freq: Once | INTRAMUSCULAR | Status: AC
  Administered 2020-12-10: 4 mg via INTRAVENOUS
  Filled 2020-12-10: qty 2

## 2020-12-10 MED ORDER — FAMOTIDINE IN NACL 20-0.9 MG/50ML-% IV SOLN
20.0000 mg | Freq: Once | INTRAVENOUS | Status: AC
  Administered 2020-12-10: 20 mg via INTRAVENOUS
  Filled 2020-12-10: qty 50

## 2020-12-10 MED ORDER — IOHEXOL 300 MG/ML  SOLN
100.0000 mL | Freq: Once | INTRAMUSCULAR | Status: AC | PRN
  Administered 2020-12-10: 100 mL via INTRAVENOUS

## 2020-12-10 MED ORDER — LIDOCAINE HCL URETHRAL/MUCOSAL 2 % EX GEL
1.0000 "application " | Freq: Once | CUTANEOUS | Status: AC
  Administered 2020-12-10: 1
  Filled 2020-12-10: qty 11

## 2020-12-10 MED ORDER — MORPHINE SULFATE (PF) 4 MG/ML IV SOLN
4.0000 mg | Freq: Once | INTRAVENOUS | Status: AC
  Administered 2020-12-10: 4 mg via INTRAVENOUS
  Filled 2020-12-10: qty 1

## 2020-12-10 NOTE — ED Triage Notes (Signed)
Pt comes in with chest wall pain and vomiting. Pt has significant hx of the same. Pt unable to keep down her parkinson's meds.

## 2020-12-10 NOTE — ED Notes (Signed)
Patient given graham crackers and water to drink

## 2020-12-10 NOTE — ED Notes (Signed)
Patient given water PO

## 2020-12-10 NOTE — ED Provider Notes (Signed)
[facility] EMERGENCY DEPT Provider Note   CSN: [id number] Arrival date & time: 12/10/20  0947     History Chief Complaint  Patient presents with  . Chest Pain    Kristin [name] is a 70 y.o. female.  70 yo F with a cc of epigastric abdominal pain that radiates to the chest with nausea and vomiting.  This been going on for the past couple days.  Started after lunch 2 days ago.  She has had recurrent episodes of this thought to be due to her hiatal hernia.  She called her GI doctor and family doctor who suggested she come here to be evaluated.  She has not been able to take her home medications in about 48 hours.  Denies fevers.  Feels very similar to her prior episodes though thinks that this time its been more persistent than previous.  Has been having bowel movements normally.  No urinary symptoms.  The history is provided by the patient.  Chest Pain Pain location:  Epigastric Pain quality: aching   Pain radiates to:  Does not radiate Pain severity:  Moderate Onset quality:  Sudden Duration:  2 days Timing:  Constant Progression:  Worsening Chronicity:  New Relieved by:  Nothing Worsened by:  Nothing Ineffective treatments:  None tried Associated symptoms: abdominal pain   Associated symptoms: no dizziness, no fever, no headache, no nausea, no palpitations, no shortness of breath and no vomiting        Past Medical History:  Diagnosis Date  . Abnormal vaginal Pap smear   . Acute pharyngitis 02/25/2013  . Anemia   . Anxiety   . Arthritis   . 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 Chicken pox as a child  . Chronic UTI    sees dr Terance Hart  . Constipation 12/07/2015  . Depression with anxiety 11/02/2009   Qualifier: Diagnosis of  By: [name], LPN, [name]   . Dermatitis 07/17/2012  . Esophageal stricture 1994  . Fibroids   . Foot pain, bilateral 06/19/2012  . GERD (gastroesophageal reflux disease)   . GERD (gastroesophageal reflux  disease)   . Hiatal hernia   . Hyperglycemia 08/19/2013  . 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 Hyperlipidemia   . Hypertension   . Infertility, female   . Low back pain 10/17/2007   Qualifier: Diagnosis of  By: [name], [name]   . Measles as a child  . Obesity   . Osteoarthritis   . Parkinson disease ([place])   . Plantar fasciitis of left foot 06/19/2012  . Preventative health care 12/19/2015  . Rheumatoid arthritis ([place]) 01/08/2018  . Rosacea 10/05/2014  . Swallowing difficulty   . Urinary frequency 02/25/2013  . Visual floaters 05/11/2014    Patient Active Problem List   Diagnosis Date Noted  . Anemia 03/16/2020  . Coronary artery disease involving native coronary artery of native heart without angina pectoris 06/12/2019  . Abnormal stress test   . Diastolic dysfunction 97/10/6376  . Cough 05/10/2018  . Rheumatoid arthritis ([place]) 01/08/2018  . Hand pain, right 10/15/2017  . Vitamin D deficiency 12/20/2016  . Prediabetes 12/20/2016  . Shortness of breath on exertion 11/27/2016  . Preventative health care 12/19/2015  . Constipation 12/07/2015  . BCC (basal cell carcinoma of skin) 10/05/2014  . Rosacea 10/05/2014  . Parkinson's disease ([place]) 05/11/2014  . Visual floaters 05/11/2014  . Paralysis agitans ([place]) 01/08/2014  . Screening for cervical cancer 07/17/2012  .  Foot pain, bilateral 06/19/2012  . 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 Obesity 11/12/2009  . Depression with anxiety 11/02/2009  . Urinary tract infection 12/03/2008  . 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 Lumbar back pain with radiculopathy affecting lower extremity 10/17/2007  . Essential hypertension 07/05/2007  . Osteoarthritis 07/05/2007  . Hyperlipidemia 06/26/2007  . H/O: iron deficiency anemia 05/08/2007  . Hiatal hernia with GERD 05/08/2007    Past Surgical History:  Procedure Laterality Date  . ABDOMINAL HYSTERECTOMY  2006   total  . esophageal     stretching  . laporoscopy    .  LEFT HEART CATH AND CORONARY ANGIOGRAPHY N/A 06/02/2019   Procedure: LEFT HEART CATH AND CORONARY ANGIOGRAPHY;  Surgeon: Jettie Booze, MD;  Location: [facility];  Service: Cardiovascular;  Laterality: N/A;  . TONSILLECTOMY    . TOTAL HIP ARTHROPLASTY Right 2010  . wisdom teeth extracted       OB History    Gravida  1   Para  1   Term  1   Preterm      AB      Living  1     SAB      IAB      Ectopic      Multiple      Live Births              Family History  Problem Relation Age of Onset  . Cancer Mother        breast  . Other Mother        arrythmia  . Mental illness Mother        bipolar  . Hyperlipidemia Mother   . Thyroid disease Mother   . Depression Mother   . Bipolar disorder Mother   . Heart disease Father   . Arthritis Father        rheumatoid  . Hypertension Father   . Hyperlipidemia Father   . Depression Sister   . Mental illness Sister        bipolar  . Parkinson's disease Sister   . Arthritis Sister   . Arthritis Sister   . Arthritis Sister   . Colon cancer Neg Hx   . Esophageal cancer Neg Hx   . Rectal cancer Neg Hx   . Stomach cancer Neg Hx     Social History   Tobacco Use  . Smoking status: Never Smoker  . Smokeless tobacco: Never Used  Vaping Use  . Vaping Use: Never used  Substance Use Topics  . Alcohol use: Not Currently  . Drug use: No    Home Medications Prior to Admission medications   Medication Sig Start Date End Date Taking? Authorizing Provider  B-D TB SYRINGE 1CC/27GX1/2" 27G X 1/2" 1 ML MISC  04/01/18   [provider]  carbidopa-levodopa (PARCOPA) 25-100 MG disintegrating tablet Take 1 tablet by mouth 5 (five) times daily.    [provider]  carbidopa-levodopa (SINEMET CR) 50-200 MG tablet TAKE 1 TABLET BY MOUTH EVERYDAY AT BEDTIME 10/28/20   Tat, Rebecca S, DO  carvedilol (COREG) 3.125 MG tablet Take 1 tablet (3.125 mg total) by mouth 2 (two) times daily with a meal.  10/28/20   Mosie Lukes, MD  Cholecalciferol (VITAMIN D3) 50 MCG (2000 UT) TABS Take 1 capsule by mouth daily. Take daily everyday except Thursdays Patient not taking: Reported on 12/07/2020    [provider]  DULoxetine (CYMBALTA) 30 MG capsule Take 1 capsule (30  mg total) by mouth daily. 08/03/20   Mosie Lukes, MD  escitalopram (LEXAPRO) 20 MG tablet Take 1 tablet (20 mg total) by mouth daily. 08/03/20   Mosie Lukes, MD  famotidine (PEPCID) 40 MG tablet TAKE 1 TABLET BY MOUTH EVERYDAY AT BEDTIME Patient not taking: Reported on 12/07/2020 10/07/20   Willia Craze, NP  Ferrous Fumarate-Folic Acid (HEMATINIC/FOLIC ACID) 563-1 MG TABS Take 1 tablet by mouth daily. Patient not taking: Reported on 12/07/2020 09/08/20   Mosie Lukes, MD  folic acid (FOLVITE) 1 MG tablet Take 1 mg by mouth 3 (three) times daily.  03/21/18   [provider]  furosemide (LASIX) 20 MG tablet Take 20 mg by mouth See admin instructions. Take on Tuesday and Saturday Patient not taking: Reported on 12/07/2020    [provider]  glycerin adult 2 g suppository Place 1 suppository rectally as needed for constipation. 10/07/20   Petrucelli, Glynda Jaeger, PA-C  HYDROcodone-acetaminophen (NORCO/VICODIN) 5-325 MG tablet Take 1 tablet by mouth every 6 (six) hours as needed. 11/26/20   [provider]  hydroxychloroquine (PLAQUENIL) 200 MG tablet Take 2 tablets (400 mg total) by mouth daily. 04/21/19   Mosie Lukes, MD  losartan (COZAAR) 50 MG tablet TAKE 1 TABLET BY MOUTH EVERY DAY 03/18/20   Mosie Lukes, MD  methotrexate 250 MG/10ML injection methotrexate sodium 25 mg/mL injection solution  INJECT 0.8MLS SUBCUTANEOUSLY EVERY 7 DAYS    [provider]  methotrexate 50 MG/2ML injection Inject 20 mg into the skin once a week. 04/01/20   [provider]  polyethylene glycol (MIRALAX / GLYCOLAX) 17 g packet Take 17 g by mouth every other day.    [provider]   pramipexole (MIRAPEX) 0.5 MG tablet TAKE 1 TABLET (0.5 MG TOTAL) BY MOUTH 3 (THREE) TIMES DAILY. 10/28/20   Tat, Eustace Quail, DO  rosuvastatin (CRESTOR) 10 MG tablet TAKE 1 TABLET BY MOUTH ON MONDAYS, [facility] AND [facility] 02/18/20   Mosie Lukes, MD  tiZANidine (ZANAFLEX) 4 MG tablet Take 0.5-1 tablets (2-4 mg total) by mouth 2 (two) times daily as needed for muscle spasms. 12/07/20   Mosie Lukes, MD  traMADol (ULTRAM) 50 MG tablet Take 50 mg by mouth every 6 (six) hours as needed. 11/22/20   [provider]    Allergies    Prednisone, Bactrim [sulfamethoxazole-trimethoprim], and Penicillins  Review of Systems   Review of Systems  Constitutional: Negative for chills and fever.  HENT: Negative for congestion and rhinorrhea.   Eyes: Negative for redness and visual disturbance.  Respiratory: Negative for shortness of breath and wheezing.   Cardiovascular: Positive for chest pain. Negative for palpitations.  Gastrointestinal: Positive for abdominal pain. Negative for nausea and vomiting.  Genitourinary: Negative for dysuria and urgency.  Musculoskeletal: Negative for arthralgias and myalgias.  Skin: Negative for pallor and wound.  Neurological: Negative for dizziness and headaches.    Physical Exam Updated Vital Signs BP (!) 147/76 (BP Location: Left Arm)   Pulse 76   Temp 99.9 F (37.7 C) (Oral)   Resp 18   Ht 5\' 4"  (1.626 m)   Wt 106.6 kg   SpO2 97%   BMI 40.34 kg/m   Physical Exam Vitals and nursing note reviewed.  Constitutional:      General: She is not in acute distress.    Appearance: She is well-developed. She is not diaphoretic.  HENT:     Head: Normocephalic and atraumatic.  Eyes:  Pupils: Pupils are equal, round, and reactive to light.  Cardiovascular:     Rate and Rhythm: Normal rate and regular rhythm.     Heart sounds: No murmur heard. No friction rub. No gallop.   Pulmonary:     Effort: Pulmonary effort is normal.     Breath sounds: No  wheezing or rales.  Abdominal:     General: There is no distension.     Palpations: Abdomen is soft.     Tenderness: There is no abdominal tenderness.  Musculoskeletal:        General: No tenderness.     Cervical back: Normal range of motion and neck supple.  Skin:    General: Skin is warm and dry.  Neurological:     Mental Status: She is alert and oriented to person, place, and time.  Psychiatric:        Behavior: Behavior normal.     ED Results / Procedures / Treatments   Labs (all labs ordered are listed, but only abnormal results are displayed) Labs Reviewed  COMPREHENSIVE METABOLIC PANEL - Abnormal; Notable for the following components:      Result Value   Potassium 3.4 (*)    Glucose, Bld 121 (*)    All other components within normal limits  LIPASE, BLOOD - Abnormal; Notable for the following components:   Lipase 53 (*)    All other components within normal limits  RESP PANEL BY RT-PCR (FLU A&B, COVID) ARPGX2  CBC WITH DIFFERENTIAL/PLATELET  TROPONIN I (HIGH SENSITIVITY)    EKG EKG Interpretation  Date/Time:  Friday December 10 2020 10:00:05 EDT Ventricular Rate:  74 PR Interval:  163 QRS Duration: 101 QT Interval:  435 QTC Calculation: 483 R Axis:   -24 Text Interpretation: Sinus rhythm Probable left ventricular hypertrophy Lateral infarct, old No significant change since last tracing Confirmed by Deno Etienne [phone]) on 12/10/2020 12:02:47 PM   Radiology CT ABDOMEN PELVIS W CONTRAST  Result Date: 12/10/2020 CLINICAL DATA:  Abdominal pain, history Parkinson's, hypertension, GERD, hiatal hernia, rheumatoid arthritis EXAM: CT ABDOMEN AND PELVIS WITH CONTRAST TECHNIQUE: Multidetector CT imaging of the abdomen and pelvis was performed using the standard protocol following bolus administration of intravenous contrast. CONTRAST:  167mL OMNIPAQUE IOHEXOL 300 MG/ML SOLN IV. No oral contrast. COMPARISON:  10/08/2020 FINDINGS: Lower chest: Mild LEFT basilar atelectasis.  Hepatobiliary: Gallbladder and liver normal appearance Pancreas: Normal appearance Spleen: Normal appearance Adrenals/Urinary Tract: Adrenal glands, kidneys, ureters, and bladder normal appearance Stomach/Bowel: Normal appendix. Single uncomplicated diverticulum at sigmoid colon. Large and small bowel loops otherwise normal appearance. Large hiatal hernia with stomach distended by fluid. GE junction above diaphragm with mild distension of distal esophagus by fluid. Distal gastric antrum and pylorus below diaphragm. Proximal gastric antrum is within hiatal hernia and located cranial to gastric body, which is located below diaphragm. Findings are compatible with a component of partial gastric volvulus and paraesophageal herniation of a portion of the distal stomach. The degree of fluid distention of the stomach suggests a component of outlet obstruction and is new. Vascular/Lymphatic: No adenopathy. Minimal atherosclerotic calcification of aorta and iliac arteries. Reproductive: Uterus surgically absent with nonvisualization of ovaries Other: Small umbilical hernia containing fat. No free air or free fluid. Musculoskeletal: Osseous demineralization with degenerative disc disease changes lumbar spine. RIGHT hip prosthesis. IMPRESSION: Distended stomach containing a significant amount of fluid consistent with a component of gastric outlet obstruction. Large hiatal hernia containing gastric cardia, with body of stomach below the diaphragm, and the proximal  gastric antrum partially volvulized and within a paraesophageal hernia. The position of the stomach is similar to the previous exam though the gastric distension is new. Findings called to Dr. [name] Nine on 12/10/2020 at 1145 hours. Electronically Signed   By: Lavonia Dana M.D.   On: 12/10/2020 11:47    Procedures Procedures   Medications Ordered in ED Medications  lidocaine (XYLOCAINE) 2 % viscous mouth solution 15 mL (has no administration in time range)  sodium  chloride 0.9 % bolus 1,000 mL (0 mLs Intravenous Stopped 12/10/20 1148)  morphine 4 MG/ML injection 4 mg (4 mg Intravenous Given 12/10/20 1016)  ondansetron (ZOFRAN) injection 4 mg (4 mg Intravenous Given 12/10/20 1016)  famotidine (PEPCID) IVPB 20 mg premix (0 mg Intravenous Stopped 12/10/20 1054)  iohexol (OMNIPAQUE) 300 MG/ML solution 100 mL (100 mLs Intravenous Contrast Given 12/10/20 1101)  ondansetron (ZOFRAN) injection 4 mg (4 mg Intravenous Given 12/10/20 1328)    ED Course  I have reviewed the triage vital signs and the nursing notes.  Pertinent labs & imaging results that were available during my care of the patient were reviewed by me and considered in my medical decision making (see chart for details).    MDM Rules/Calculators/A&P                          70 yo  F with a chief complaints of epigastric abdominal pain that radiates to the chest with nausea and vomiting.  Unfortunately this is a recurrent problem for her.  She has a history of hiatal hernia and is scheduled to have surgical repair in a month's time.  Feels like her prior.  Typically her pain and symptoms resolve after an episode of vomiting but this 1 is been more persistent.  She has been passing flatus and stool.  No fevers.  Will obtain blood work pain and nausea medicine CT scan.  Blood work without significant finding.  Mildly elevated lipase of unknown significance at 53.  LFTs unremarkable.  No leukocytosis.  CT scan of the abdomen pelvis concerning for gastric outlet obstruction.  I discussed the results with the radiologist.  As the patient has been seen at [facility] for this I will discuss with the [facility] general surgeon on-call.  I was able to discuss the case with her general surgeon, Dr. Diona Browner, plan for oral trial here.  If fails then we will likely try to transfer to [facility].  Patient after half a cracker unfortunately started vomiting.  Discussed again with [facility] who will have his transfer ED to ED.  Will attempt to  place an NG tube.  The patients results and plan were reviewed and discussed.   Any x-rays performed were independently reviewed by myself.   Differential diagnosis were considered with the presenting HPI.  Medications  lidocaine (XYLOCAINE) 2 % viscous mouth solution 15 mL (has no administration in time range)  sodium chloride 0.9 % bolus 1,000 mL (0 mLs Intravenous Stopped 12/10/20 1148)  morphine 4 MG/ML injection 4 mg (4 mg Intravenous Given 12/10/20 1016)  ondansetron (ZOFRAN) injection 4 mg (4 mg Intravenous Given 12/10/20 1016)  famotidine (PEPCID) IVPB 20 mg premix (0 mg Intravenous Stopped 12/10/20 1054)  iohexol (OMNIPAQUE) 300 MG/ML solution 100 mL (100 mLs Intravenous Contrast Given 12/10/20 1101)  ondansetron (ZOFRAN) injection 4 mg (4 mg Intravenous Given 12/10/20 1328)    Vitals:   12/10/20 1045 12/10/20 1153 12/10/20 1230 12/10/20 1340  BP: 134/72 (!) 139/103 Marland Kitchen)  171/82 (!) 147/76  Pulse: 75 86 78 76  Resp: 13 (!) 21 15 18   Temp:      TempSrc:      SpO2: 98% 95% 99% 97%  Weight:      Height:        Final diagnoses:  Gastric outlet obstruction  Hiatal hernia       Final Clinical Impression(s) / ED Diagnoses Final diagnoses:  Gastric outlet obstruction  Hiatal hernia    Rx / DC Orders ED Discharge Orders    None       Deno Etienne, DO 12/10/20 1410

## 2020-12-10 NOTE — ED Notes (Signed)
Patient vomited after half of a cracker

## 2020-12-10 NOTE — Telephone Encounter (Signed)
Patient with a very large hiatal hernia that is scheduled for repair at [facility] on 12/22/20.  She began retching and vomiting yesterday.  She has chest pain as well.  She has tried zofran at home, but unable to keep any meds or fluids down. She has immediate gagging and retching as soon as she swallows.   She is unable to take her parkinson's medications as well.  She and her husband are advised to proceed to the ED for eval.  They have a call into the surgeon at [facility] as well.  They plan to proceed to the new [facility] ED.

## 2020-12-12 LAB — TEST AUTHORIZATION

## 2020-12-12 LAB — VITAMIN D 1,25 DIHYDROXY
Vitamin D 1, 25 (OH)2 Total: 19 pg/mL (ref 18–72)
Vitamin D2 1, 25 (OH)2: 19 pg/mL
Vitamin D3 1, 25 (OH)2: <8 pg/mL

## 2020-12-12 LAB — IRON,TIBC AND FERRITIN PANEL
%SAT: 21 % (calc) (ref 16–45)
Ferritin: 46 ng/mL (ref 16–288)
Iron: 89 ug/dL (ref 45–160)
TIBC: 414 mcg/dL (calc) (ref 250–450)

## 2020-12-12 LAB — INTRINSIC FACTOR ANTIBODIES: Intrinsic Factor: NEGATIVE

## 2020-12-13 ENCOUNTER — Other Ambulatory Visit: Payer: Self-pay | Admitting: Neurosurgery

## 2020-12-13 DIAGNOSIS — G8929 Other chronic pain: Secondary | ICD-10-CM

## 2020-12-14 ENCOUNTER — Other Ambulatory Visit: Payer: Self-pay | Admitting: Family Medicine

## 2020-12-15 ENCOUNTER — Ambulatory Visit: Payer: Medicare Other

## 2020-12-15 ENCOUNTER — Other Ambulatory Visit: Payer: Self-pay | Admitting: Family Medicine

## 2020-12-23 ENCOUNTER — Telehealth (INDEPENDENT_AMBULATORY_CARE_PROVIDER_SITE_OTHER): Payer: Medicare Other | Admitting: Family Medicine

## 2020-12-23 ENCOUNTER — Other Ambulatory Visit: Payer: Self-pay

## 2020-12-23 DIAGNOSIS — M5416 Radiculopathy, lumbar region: Secondary | ICD-10-CM

## 2020-12-23 DIAGNOSIS — R7303 Prediabetes: Secondary | ICD-10-CM | POA: Diagnosis not present

## 2020-12-23 DIAGNOSIS — K219 Gastro-esophageal reflux disease without esophagitis: Secondary | ICD-10-CM | POA: Diagnosis not present

## 2020-12-23 DIAGNOSIS — K449 Diaphragmatic hernia without obstruction or gangrene: Secondary | ICD-10-CM

## 2020-12-23 DIAGNOSIS — I10 Essential (primary) hypertension: Secondary | ICD-10-CM | POA: Diagnosis not present

## 2020-12-23 NOTE — Assessment & Plan Note (Signed)
Monitor and report any concerns. Encouraged heart healthy diet such as the DASH diet and exercise as tolerated.

## 2020-12-23 NOTE — Assessment & Plan Note (Signed)
hgba1c acceptable, minimize simple carbs. Increase exercise as tolerated.  

## 2020-12-23 NOTE — Assessment & Plan Note (Signed)
Encouraged moist heat and gentle stretching as tolerated. May try NSAIDs and prescription meds as directed and report if symptoms worsen or seek immediate care. Try Tylenol bid, topical treatments and moist heat.

## 2020-12-23 NOTE — Assessment & Plan Note (Signed)
Suff ered a gastricoutlet obstruction and had to have an emergency surgery. She is doing well and is on a clear liquid diet for the next month. Discussed strategies for increasing protein in diet.

## 2020-12-23 NOTE — Progress Notes (Addendum)
MyChart Video Visit    Virtual Visit via Telephone Note   This visit type was conducted due to national recommendations for restrictions regarding the COVID-19 Pandemic (e.g. social distancing) in an effort to limit this patient's exposure and mitigate transmission in our community. This patient is at least at moderate risk for complications without adequate follow up. This format is felt to be most appropriate for this patient at this time. Physical exam was limited by quality of the audio technology used for the visit. S Chism, CMA was able to get the patient set up on a telephone visit.  Patient location: home Patient and provider in visit Provider location: Office  I discussed the limitations of evaluation and management by telemedicine and the availability of in person appointments. The patient expressed understanding and agreed to proceed.  Visit Date: 12/23/2020  Today's healthcare provider: [name], MD     Subjective:    Patient ID: Kristin [name], female    DOB: 1950-12-01, 70 y.o.   MRN: [id number]  Chief Complaint  Patient presents with  . Follow-up    HPI Patient is in today for hospital follow up. After her last visit a few days later she had worsening abdominal and chest pain and she suffered a gastric outlet obstruction secondary to her hiatal hernia. She had surgery and is doing much better. She is tolerating a liquid diet and is moving her bowels well. No fevers or chills or signs of systemic illness. Her biggest concern is her ongoing back pain she has trouble getting comfortable at times. Denies CP/palp/SOB/HA/congestion/fevers or GU c/o. Taking meds as prescribed  Past Medical History:  Diagnosis Date  . Abnormal vaginal Pap smear   . Acute pharyngitis 02/25/2013  . Anemia   . Anxiety   . Arthritis   . 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 Chicken pox as a child  . Chronic UTI    sees dr Terance Hart  . Constipation 12/07/2015  .  Depression with anxiety 11/02/2009   Qualifier: Diagnosis of  By: [name], LPN, [name]   . Dermatitis 07/17/2012  . Esophageal stricture 1994  . Fibroids   . Foot pain, bilateral 06/19/2012  . GERD (gastroesophageal reflux disease)   . GERD (gastroesophageal reflux disease)   . Hiatal hernia   . Hyperglycemia 08/19/2013  . 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 Hyperlipidemia   . Hypertension   . Infertility, female   . Low back pain 10/17/2007   Qualifier: Diagnosis of  By: [name], [name]   . Measles as a child  . Obesity   . Osteoarthritis   . Parkinson disease ([place])   . Plantar fasciitis of left foot 06/19/2012  . Preventative health care 12/19/2015  . Rheumatoid arthritis ([place]) 01/08/2018  . Rosacea 10/05/2014  . Swallowing difficulty   . Urinary frequency 02/25/2013  . Visual floaters 05/11/2014    Past Surgical History:  Procedure Laterality Date  . ABDOMINAL HYSTERECTOMY  2006   total  . esophageal     stretching  . laporoscopy    . LEFT HEART CATH AND CORONARY ANGIOGRAPHY N/A 06/02/2019   Procedure: LEFT HEART CATH AND CORONARY ANGIOGRAPHY;  Surgeon: Jettie Booze, MD;  Location: [facility];  Service: Cardiovascular;  Laterality: N/A;  . TONSILLECTOMY    . TOTAL HIP ARTHROPLASTY Right 2010  . wisdom teeth extracted      Family History  Problem Relation Age of Onset  .  Cancer Mother        breast  . Other Mother        arrythmia  . Mental illness Mother        bipolar  . Hyperlipidemia Mother   . Thyroid disease Mother   . Depression Mother   . Bipolar disorder Mother   . Heart disease Father   . Arthritis Father        rheumatoid  . Hypertension Father   . Hyperlipidemia Father   . Depression Sister   . Mental illness Sister        bipolar  . Parkinson's disease Sister   . Arthritis Sister   . Arthritis Sister   . Arthritis Sister   . Colon cancer Neg Hx   . Esophageal cancer Neg Hx   . Rectal cancer Neg Hx   . Stomach cancer Neg Hx      Social History   Socioeconomic History  . Marital status: Married    Spouse name: Not on file  . Number of children: 1  . Years of education: Not on file  . Highest education level: Master's degree (e.g., MA, MS, MEng, MEd, MSW, MBA)  Occupational History  . Occupation: Retired Teacher, music  Tobacco Use  . Smoking status: Never Smoker  . Smokeless tobacco: Never Used  Vaping Use  . Vaping Use: Never used  Substance and Sexual Activity  . Alcohol use: Not Currently  . Drug use: No  . Sexual activity: Not Currently    Partners: Male  Other Topics Concern  . Not on file  Social History Narrative   Lives with husband, no major dietary restrictions, retired from teaching      Husband dx with cancer MDS June 2017.   Currently in remission. (11/03/16 pc)      Pt has one biological child the other 3 are adopted      Social Determinants of Health   Financial Resource Strain: Low Risk   . Difficulty of Paying Living Expenses: Not hard at all  Food Insecurity: No Food Insecurity  . Worried About Charity fundraiser in the Last Year: Never true  . Ran Out of Food in the Last Year: Never true  Transportation Needs: No Transportation Needs  . Lack of Transportation (Medical): No  . Lack of Transportation (Non-Medical): No  Physical Activity: Sufficiently Active  . Days of Exercise per Week: 6 days  . Minutes of Exercise per Session: 40 min  Stress: No Stress Concern Present  . Feeling of Stress : Not at all  Social Connections: Moderately Integrated  . Frequency of Communication with Friends and Family: More than three times a week  . Frequency of Social Gatherings with Friends and Family: Once a week  . Attends Religious Services: 1 to 4 times per year  . Active Member of Clubs or Organizations: No  . Attends Archivist Meetings: Never  . Marital Status: Married  Human resources officer Violence: Not At Risk  . Fear of Current or Ex-Partner: No  . Emotionally  Abused: No  . Physically Abused: No  . Sexually Abused: No    Outpatient Medications Prior to Visit  Medication Sig Dispense Refill  . B-D TB SYRINGE 1CC/27GX1/2" 27G X 1/2" 1 ML MISC     . carbidopa-levodopa (PARCOPA) 25-100 MG disintegrating tablet Take 1 tablet by mouth 5 (five) times daily.    . carbidopa-levodopa (SINEMET CR) 50-200 MG tablet TAKE 1 TABLET BY MOUTH EVERYDAY AT BEDTIME  90 tablet 0  . carvedilol (COREG) 3.125 MG tablet Take 1 tablet (3.125 mg total) by mouth 2 (two) times daily with a meal. 180 tablet 1  . Cholecalciferol (VITAMIN D3) 50 MCG (2000 UT) TABS Take 1 capsule by mouth daily. Take daily everyday except Thursdays    . Ferrous Fumarate-Folic Acid (HEMATINIC/FOLIC ACID) 268-3 MG TABS Take 1 tablet by mouth daily. 90 tablet 3  . folic acid (FOLVITE) 1 MG tablet Take 1 mg by mouth 3 (three) times daily.     Marland Kitchen glycerin adult 2 g suppository Place 1 suppository rectally as needed for constipation. 15 suppository 0  . HYDROcodone-acetaminophen (NORCO/VICODIN) 5-325 MG tablet Take 1 tablet by mouth every 6 (six) hours as needed.    . hydroxychloroquine (PLAQUENIL) 200 MG tablet Take 2 tablets (400 mg total) by mouth daily.    Marland Kitchen losartan (COZAAR) 50 MG tablet TAKE 1 TABLET BY MOUTH EVERY DAY 90 tablet 3  . methotrexate 250 MG/10ML injection methotrexate sodium 25 mg/mL injection solution  INJECT 0.8MLS SUBCUTANEOUSLY EVERY 7 DAYS    . methotrexate 50 MG/2ML injection Inject 20 mg into the skin once a week.    . polyethylene glycol (MIRALAX / GLYCOLAX) 17 g packet Take 17 g by mouth every other day.    . pramipexole (MIRAPEX) 0.5 MG tablet TAKE 1 TABLET (0.5 MG TOTAL) BY MOUTH 3 (THREE) TIMES DAILY. 270 tablet 0  . rosuvastatin (CRESTOR) 10 MG tablet TAKE 1 TABLET BY MOUTH ON MONDAYS, WEDNESDAYS AND FRIDAYS 54 tablet 1  . tiZANidine (ZANAFLEX) 4 MG tablet Take 0.5-1 tablets (2-4 mg total) by mouth 2 (two) times daily as needed for muscle spasms. 30 tablet 0  . traMADol  (ULTRAM) 50 MG tablet Take 50 mg by mouth every 6 (six) hours as needed.    . DULoxetine (CYMBALTA) 30 MG capsule Take 1 capsule (30 mg total) by mouth daily. 90 capsule 1  . escitalopram (LEXAPRO) 20 MG tablet Take 1 tablet (20 mg total) by mouth daily. 90 tablet 1  . famotidine (PEPCID) 40 MG tablet TAKE 1 TABLET BY MOUTH EVERYDAY AT BEDTIME (Patient not taking: Reported on 12/07/2020) 90 tablet 1  . furosemide (LASIX) 20 MG tablet Take 20 mg by mouth See admin instructions. Take on Tuesday and Saturday (Patient not taking: Reported on 12/07/2020)     No facility-administered medications prior to visit.    Allergies  Allergen Reactions  . Prednisone Dermatitis    Facial ulcers  . Bactrim [Sulfamethoxazole-Trimethoprim] Other (See Comments)    Oral ulcers and rash  . Penicillins Hives    Did it involve swelling of the face/tongue/throat, SOB, or low BP? No Did it involve sudden or severe rash/hives, skin peeling, or any reaction on the inside of your mouth or nose? No Did you need to seek medical attention at a hospital or doctor's office? No When did it last happen?30+ years ago If all above answers are "NO", may proceed with cephalosporin use.     Review of Systems  Constitutional: Positive for malaise/fatigue. Negative for fever.  HENT: Negative for congestion.   Eyes: Negative for blurred vision.  Respiratory: Negative for shortness of breath.   Cardiovascular: Negative for chest pain, palpitations and leg swelling.  Gastrointestinal: Positive for abdominal pain and nausea. Negative for blood in stool and constipation.  Genitourinary: Negative for dysuria and frequency.  Musculoskeletal: Positive for back pain and myalgias. Negative for falls.  Skin: Negative for rash.  Neurological: Negative for dizziness, loss  of consciousness and headaches.  Endo/Heme/Allergies: Negative for environmental allergies.  Psychiatric/Behavioral: Negative for depression. The patient is  nervous/anxious.        Objective:    Physical Exam  There were no vitals taken for this visit. Wt Readings from Last 3 Encounters:  12/10/20 235 lb (106.6 kg)  12/07/20 232 lb 6.4 oz (105.4 kg)  11/03/20 232 lb (105.2 kg)    Diabetic Foot Exam - Simple   No data filed    Lab Results  Component Value Date   WBC 10.1 12/10/2020   HGB 14.0 12/10/2020   HCT 41.3 12/10/2020   PLT 246 12/10/2020   GLUCOSE 121 (H) 12/10/2020   CHOL 155 12/07/2020   TRIG 108.0 12/07/2020   HDL 67.20 12/07/2020   LDLDIRECT 161.5 10/07/2008   LDLCALC 66 12/07/2020   ALT 20 12/10/2020   AST 19 12/10/2020   NA 144 12/10/2020   K 3.4 (L) 12/10/2020   CL 102 12/10/2020   CREATININE 0.85 12/10/2020   BUN 20 12/10/2020   CO2 28 12/10/2020   TSH 1.15 12/07/2020   INR 2.5 (H) 01/19/2009   HGBA1C 5.8 12/07/2020    Lab Results  Component Value Date   TSH 1.15 12/07/2020   Lab Results  Component Value Date   WBC 10.1 12/10/2020   HGB 14.0 12/10/2020   HCT 41.3 12/10/2020   MCV 91.0 12/10/2020   PLT 246 12/10/2020   Lab Results  Component Value Date   NA 144 12/10/2020   K 3.4 (L) 12/10/2020   CO2 28 12/10/2020   GLUCOSE 121 (H) 12/10/2020   BUN 20 12/10/2020   CREATININE 0.85 12/10/2020   BILITOT 1.1 12/10/2020   ALKPHOS 67 12/10/2020   AST 19 12/10/2020   ALT 20 12/10/2020   PROT 7.6 12/10/2020   ALBUMIN 4.8 12/10/2020   CALCIUM 10.1 12/10/2020   ANIONGAP 14 12/10/2020   GFR 88.14 12/07/2020   Lab Results  Component Value Date   CHOL 155 12/07/2020   Lab Results  Component Value Date   HDL 67.20 12/07/2020   Lab Results  Component Value Date   LDLCALC 66 12/07/2020   Lab Results  Component Value Date   TRIG 108.0 12/07/2020   Lab Results  Component Value Date   CHOLHDL 2 12/07/2020   Lab Results  Component Value Date   HGBA1C 5.8 12/07/2020       Assessment & Plan:   Problem List Items Addressed This Visit    Essential hypertension    Monitor and  report any concerns. Encouraged heart healthy diet such as the DASH diet and exercise as tolerated.      Hiatal hernia with GERD    Suff ered a gastricoutlet obstruction and had to have an emergency surgery. She is doing well and is on a clear liquid diet for the next month. Discussed strategies for increasing protein in diet.       Lumbar back pain with radiculopathy affecting lower extremity    Encouraged moist heat and gentle stretching as tolerated. May try NSAIDs and prescription meds as directed and report if symptoms worsen or seek immediate care. Try Tylenol bid, topical treatments and moist heat.       Prediabetes    hgba1c acceptable, minimize simple carbs. Increase exercise as tolerated.         I have discontinued [name]. Shindler's famotidine and furosemide. I am also having her maintain her folic acid, B-D TB SYRINGE 1CC/27GX1/2", hydroxychloroquine, rosuvastatin, losartan,  methotrexate, Vitamin D3, escitalopram, DULoxetine, Hematinic/Folic Acid, glycerin adult, carbidopa-levodopa, carvedilol, pramipexole, polyethylene glycol, carbidopa-levodopa, traMADol, HYDROcodone-acetaminophen, tiZANidine, and methotrexate.  No orders of the defined types were placed in this encounter.   I discussed the assessment and treatment plan with the patient. The patient was provided an opportunity to ask questions and all were answered. The patient agreed with the plan and demonstrated an understanding of the instructions.   The patient was advised to call back or seek an in-person evaluation if the symptoms worsen or if the condition fails to improve as anticipated.  I provided 26 minutes of non face-to-face time during this encounter.   [name], MD [facility] [phone] (phone) [phone] (fax)  [place]

## 2020-12-27 DIAGNOSIS — Z9889 Other specified postprocedural states: Secondary | ICD-10-CM | POA: Diagnosis not present

## 2020-12-27 DIAGNOSIS — M5430 Sciatica, unspecified side: Secondary | ICD-10-CM | POA: Diagnosis not present

## 2020-12-27 DIAGNOSIS — M549 Dorsalgia, unspecified: Secondary | ICD-10-CM | POA: Diagnosis not present

## 2021-01-03 ENCOUNTER — Ambulatory Visit
Admission: RE | Admit: 2021-01-03 | Discharge: 2021-01-03 | Disposition: A | Discharge status: Home / Self Care | Payer: Medicare Other | Source: Ambulatory Visit | Attending: Neurosurgery | Admitting: Neurosurgery

## 2021-01-03 ENCOUNTER — Other Ambulatory Visit: Payer: Self-pay

## 2021-01-03 DIAGNOSIS — M545 Low back pain, unspecified: Secondary | ICD-10-CM | POA: Diagnosis not present

## 2021-01-03 DIAGNOSIS — M48061 Spinal stenosis, lumbar region without neurogenic claudication: Secondary | ICD-10-CM | POA: Diagnosis not present

## 2021-01-03 DIAGNOSIS — G8929 Other chronic pain: Secondary | ICD-10-CM

## 2021-01-05 DIAGNOSIS — M5441 Lumbago with sciatica, right side: Secondary | ICD-10-CM | POA: Diagnosis not present

## 2021-01-05 DIAGNOSIS — M545 Low back pain, unspecified: Secondary | ICD-10-CM | POA: Diagnosis not present

## 2021-01-05 DIAGNOSIS — M5416 Radiculopathy, lumbar region: Secondary | ICD-10-CM | POA: Diagnosis not present

## 2021-01-05 DIAGNOSIS — G20 Parkinson's disease: Secondary | ICD-10-CM | POA: Diagnosis not present

## 2021-01-05 DIAGNOSIS — M48061 Spinal stenosis, lumbar region without neurogenic claudication: Secondary | ICD-10-CM | POA: Diagnosis not present

## 2021-01-07 DIAGNOSIS — M545 Low back pain, unspecified: Secondary | ICD-10-CM | POA: Diagnosis not present

## 2021-01-07 DIAGNOSIS — Z6836 Body mass index (BMI) 36.0-36.9, adult: Secondary | ICD-10-CM | POA: Diagnosis not present

## 2021-01-07 DIAGNOSIS — S76311A Strain of muscle, fascia and tendon of the posterior muscle group at thigh level, right thigh, initial encounter: Secondary | ICD-10-CM | POA: Diagnosis not present

## 2021-01-07 DIAGNOSIS — M5416 Radiculopathy, lumbar region: Secondary | ICD-10-CM | POA: Diagnosis not present

## 2021-01-10 ENCOUNTER — Other Ambulatory Visit: Payer: Self-pay

## 2021-01-10 ENCOUNTER — Ambulatory Visit (HOSPITAL_BASED_OUTPATIENT_CLINIC_OR_DEPARTMENT_OTHER)
Admission: RE | Admit: 2021-01-10 | Discharge: 2021-01-10 | Disposition: A | Discharge status: Home / Self Care | Payer: Medicare Other | Source: Ambulatory Visit | Attending: Family Medicine | Admitting: Family Medicine

## 2021-01-10 DIAGNOSIS — Z780 Asymptomatic menopausal state: Secondary | ICD-10-CM | POA: Diagnosis not present

## 2021-01-11 ENCOUNTER — Ambulatory Visit: Payer: Medicare Other | Attending: Family Medicine | Admitting: Physical Therapy

## 2021-01-11 DIAGNOSIS — M79604 Pain in right leg: Secondary | ICD-10-CM

## 2021-01-11 DIAGNOSIS — R29898 Other symptoms and signs involving the musculoskeletal system: Secondary | ICD-10-CM | POA: Diagnosis not present

## 2021-01-11 DIAGNOSIS — R2689 Other abnormalities of gait and mobility: Secondary | ICD-10-CM | POA: Diagnosis not present

## 2021-01-11 DIAGNOSIS — R293 Abnormal posture: Secondary | ICD-10-CM | POA: Diagnosis not present

## 2021-01-11 DIAGNOSIS — R29818 Other symptoms and signs involving the nervous system: Secondary | ICD-10-CM | POA: Insufficient documentation

## 2021-01-12 DIAGNOSIS — Z23 Encounter for immunization: Secondary | ICD-10-CM | POA: Diagnosis not present

## 2021-01-12 DIAGNOSIS — G20 Parkinson's disease: Secondary | ICD-10-CM | POA: Diagnosis not present

## 2021-01-12 DIAGNOSIS — Z79899 Other long term (current) drug therapy: Secondary | ICD-10-CM | POA: Diagnosis not present

## 2021-01-12 DIAGNOSIS — M62838 Other muscle spasm: Secondary | ICD-10-CM | POA: Diagnosis not present

## 2021-01-12 DIAGNOSIS — M0609 Rheumatoid arthritis without rheumatoid factor, multiple sites: Secondary | ICD-10-CM | POA: Diagnosis not present

## 2021-01-12 DIAGNOSIS — R0600 Dyspnea, unspecified: Secondary | ICD-10-CM | POA: Diagnosis not present

## 2021-01-12 DIAGNOSIS — M059 Rheumatoid arthritis with rheumatoid factor, unspecified: Secondary | ICD-10-CM | POA: Diagnosis not present

## 2021-01-12 DIAGNOSIS — E559 Vitamin D deficiency, unspecified: Secondary | ICD-10-CM | POA: Diagnosis not present

## 2021-01-12 NOTE — Therapy (Signed)
[facility] [facility] [address] [place], [place], [address] Phone: [phone]   Fax:  [phone]  Physical Therapy Evaluation  Patient Details  Name: Kristin [name] MRN: [id number] Date of Birth: [birth year] Referring Provider (PT): Gwendlyn Deutscher, [place] (([facility]); has also seen Dr. Vertell Limber)   Encounter Date: 01/11/2021   PT End of Session - 01/11/21 0814    Visit Number 1    Number of Visits 17    Date for PT Re-Evaluation 0000000   90 day cert for 8 wk POC   Authorization Type Medicare/[employer]    Progress Note Due on Visit 10    PT Start Time 0804    PT Stop Time 0847    PT Time Calculation (min) 43 min    Activity Tolerance Patient tolerated treatment well    Behavior During Therapy [facility] for tasks assessed/performed           Past Medical History:  Diagnosis Date  . Abnormal vaginal Pap smear   . Acute pharyngitis 02/25/2013  . Anemia   . Anxiety   . Arthritis   . 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 Chicken pox as a child  . Chronic UTI    sees dr Terance Hart  . Constipation 12/07/2015  . Depression with anxiety 11/02/2009   Qualifier: Diagnosis of  By: [name], LPN, [name]   . Dermatitis 07/17/2012  . Esophageal stricture 1994  . Fibroids   . Foot pain, bilateral 06/19/2012  . GERD (gastroesophageal reflux disease)   . GERD (gastroesophageal reflux disease)   . Hiatal hernia   . Hyperglycemia 08/19/2013  . 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 Hyperlipidemia   . Hypertension   . Infertility, female   . Low back pain 10/17/2007   Qualifier: Diagnosis of  By: [name], [name]   . Measles as a child  . Obesity   . Osteoarthritis   . Parkinson disease ([place])   . Plantar fasciitis of left foot 06/19/2012  . Preventative health care 12/19/2015  . Rheumatoid arthritis ([place]) 01/08/2018  . Rosacea 10/05/2014  . Swallowing difficulty   . Urinary frequency 02/25/2013  . Visual floaters 05/11/2014     Past Surgical History:  Procedure Laterality Date  . ABDOMINAL HYSTERECTOMY  2006   total  . esophageal     stretching  . laporoscopy    . LEFT HEART CATH AND CORONARY ANGIOGRAPHY N/A 06/02/2019   Procedure: LEFT HEART CATH AND CORONARY ANGIOGRAPHY;  Surgeon: Jettie Booze, MD;  Location: [facility];  Service: Cardiovascular;  Laterality: N/A;  . TONSILLECTOMY    . TOTAL HIP ARTHROPLASTY Right 2010  . wisdom teeth extracted      There were no vitals filed for this visit.    Subjective Assessment - 01/11/21 0808    Subjective Saw Dr. Vertell Limber last week and had MRI.  He thinks this is not coming from the spine; he thinks it not sciatica and thinks it might be torn hamstring.  Initial referral was from my surgeon to help me get up after the hernia.  The back pain started on March 18th, bending down to reach into the freezer, and I felt a "pop" in my upper thigh/thought it was my back.  Saw the doctor the next week who said to rest, stretch, it will get better.  Then 12/12/20, had surgery for emergency hernia repair.   Had difficulty with walking during my  initial recovery from the surgery.  Using the walking pole and that helps me feel more balanced; just moving so slowly and don't have confidence with my walking due to the pain.  No falls, no stumbles.    Pertinent History See problem list    Patient Stated Goals To get back in the pool; to get back strength in my legs.    Currently in Pain? Yes    Pain Score 4    sometimes no pain; worst 7/10   Pain Location Other (Comment)   thigh   Pain Orientation Left;Posterior;Mid   worst at mid thigh, runs length of posterior thigh   Pain Descriptors / Indicators Shooting;Sharp   like something is sticking in it   Pain Type Acute pain    Pain Onset More than a month ago    Pain Frequency Constant    Aggravating Factors  walking; worse in the evening    Pain Relieving Factors Heat, rest    Effect of Pain on Daily Activities PT will  attempt to address pain through exercises and activity modulation, modalities as needed.              [facility] PT Assessment - 01/11/21 0819      Assessment   Medical Diagnosis lumbosacral pain with sciatica    Referring Provider (PT) Gwendlyn Deutscher, PA   ([facility]); has also seen Dr. Vertell Limber   Onset Date/Surgical Date 11/19/20   pain onset; surgery for hernia 12/12/20   Hand Dominance Left    Next MD Visit 01/24/2021 Posey Pronto); 03/14/2021 Vertell Limber)      Precautions   Precautions Other (comment);Fall    Precaution Comments No twisting, no bending, no picking up heavy objects from surgery for 2 more weeks from PT eval (6 total weeks from surgery); avoid stretching R hamstrings (per Dr. Vertell Limber)      Balance Screen   Has the patient fallen in the past 6 months No    Has the patient had a decrease in activity level because of a fear of falling?  Yes    Is the patient reluctant to leave their home because of a fear of falling?  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; walked on treadmill      Observation/Other Assessments   Focus on Therapeutic Outcomes (FOTO)  NA   Did not complete for back, as pt reports Dr. Vertell Limber states pain from hamstring not back.     Posture/Postural Control   Posture/Postural Control Postural limitations    Postural Limitations Rounded Shoulders;Forward head;Increased lumbar lordosis      ROM / Strength   AROM / PROM  / Strength AROM;PROM;Strength      AROM   Overall AROM  Deficits    Overall AROM Comments limitations in full R knee extension due to pain  in posterior thigh; otherwise WNL; pain with AROM in posterior/mid thigh      PROM   Overall PROM  Deficits    Overall PROM Comments Passive ROM to R hamstrings:  42 degrees from full extension; with SLR test, pain in hamstring at 60 degrees with Passive motion      Strength   Overall Strength Deficits    Overall Strength Comments No pain with resisted motion involving hamstrings    Strength Assessment Site Hip;Knee;Ankle    Right/Left Hip Right;Left    Right Hip Flexion 4/5    Left Hip Flexion 4/5    Right/Left Knee Right;Left    Right Knee Flexion 4/5    Right Knee Extension 4/5    Left Knee Flexion 4+/5    Left Knee Extension 5/5    Right/Left Ankle Right;Left    Right Ankle Dorsiflexion 4/5    Right Ankle Inversion 4/5    Right Ankle Eversion 3+/5    Left Ankle Dorsiflexion 4/5    Left Ankle Inversion 4/5    Left Ankle Eversion 4/5      Palpation   Palpation comment Palpation along lumbar and sacroiliac spine, with no pain or tenderness to palpation.  Pt very point tender at piriformis area and middle aspect of posterior R hamstring.  Pt tender along superior>middle aspects of R iliotibial band.      Transfers   Transfers Sit to Stand;Stand to Sit    Sit to Stand 5: Supervision;Without upper extremity assist;From chair/3-in-1    Five time sit to stand comments  17.75    Stand to Sit 5: Supervision;Without upper extremity assist;To chair/3-in-1      Ambulation/Gait   Ambulation/Gait Yes    Ambulation/Gait Assistance 5: Supervision;6: Modified independent (Device/Increase time)    Ambulation Distance (Feet) 60 Feet    Assistive device Other (Comment)   single walking pole   Gait Pattern Step-through pattern;Decreased arm swing - right;Decreased step length - right;Decreased step length - left;Decreased stance time - right;Decreased  weight shift to right;Antalgic    Ambulation Surface Level;Indoor    Gait velocity 17.5 sec = 1.87 ft/sec      Standardized Balance Assessment   Standardized Balance Assessment Timed Up and Go Test      Timed Up and Go Test   Normal TUG (seconds) 16.15    TUG Comments Scores >13.5 sec indicate incrased fall risk.                      Objective measurements completed on examination: See above findings.               PT Education - 01/12/21 1324    Education Details PT eval results, POC    Person(s) Educated Patient    Methods Explanation    Comprehension Verbalized understanding            PT Short Term Goals - 01/12/21 1335      PT SHORT TERM GOAL #1   Title Pt will be independent with HEP for improved posture, strength, decreased pain, and improved gait.  TARGET 02/11/2021    Time 4    Period Weeks    Status New      PT SHORT TERM GOAL #2   Title Pt will demonstrate improved flexibility in hamstrings by at least 5 degrees with no increase in pain,    Baseline -42 degrees from full extension/90/90 position    Time 4    Period Weeks  Status New      PT SHORT TERM GOAL #3   Title Pt will improve 5x sit<>stand score to less than or equal to 14 sec to improve functional strength and transfer efficiency.    Baseline 17.75 sec    Time 4    Period Weeks    Status New      PT SHORT TERM GOAL #4   Title Pt will report decrease in pain by 50% or more at worst, with gait activities, for improved functional mobility with decreased pain.    Baseline 7/10 at worst    Time 4    Period Weeks    Status New             PT Long Term Goals - 01/12/21 1514      PT LONG TERM GOAL #1   Title Pt will be independent with progression of HEP for improved functional mobility, strength, balance, decreased pain.  TARGET 03/11/2021    Time 8    Period Weeks    Status New      PT LONG TERM GOAL #2   Title Pt will improve 5x sit<>stand to less than or equal  to 12.5 seconds for improved functional strength.    Time 8    Period Weeks    Status New      PT LONG TERM GOAL #3   Title Pt will improve TUG score to less than or equal to 13.5 sec for decreased fall risk.    Time 8    Period Weeks    Status New      PT LONG TERM GOAL #4   Title Pt will improve gait velocity to at least 2.2 ft/sec for improved gait efficiency and safety.    Time 8    Period Weeks    Status New      PT LONG TERM GOAL #5   Title Pt will improve hamstring flexibility by 10 degrees from baseline, for improved overall functional mobility.    Time 8    Period Weeks    Status New                  Plan - 01/12/21 1326    Clinical Impression Statement Pt is a 71 year old female who presents to [place] with history of back/leg pain since mid-March 2022.  She was bending over to get something out of freezer at home and felt a "pop" in her R thigh.  She endorses that most pain is from buttocks to mid-thigh area and is significantly tender to palpation at mid-posterior thigh area.  Since referral from her MD at [facility], she has also seen Dr. Vertell Limber, who ordered MRI.  Based on MRI and pt's complaints, Dr. Vertell Limber feels this may be not from lumbar origin, but of muscular origin, likely from a hamstring partial tear.  Based on PT assessment today, pt does not appear to have radiating symptoms and no tenderness at her low back/SI area.  She is tender to palpation along IT band, R piriformis are and mid posterior R thigh.  She presents with decreased strength, decreased flexibility, abnormal posture, decreased timing and coordination of gait, antalgic gait pattern, slowed mobility pt is at fall risk based on gait velocity and TUG scores.  Prior to recent course with pain (and emergent hernia surgery 12/12/20), pt was independent and participating daily in community Parkinson's exercises.  She would benefit from skilled PT to address the above stated deficits to decrease  pain, improve  balance and overall improved functional mobility.    Personal Factors and Comorbidities Comorbidity 3+    Comorbidities See problem list    Examination-Activity Limitations Locomotion Level;Transfers;Squat;Stand    Examination-Participation Restrictions Community Activity;Shop;Other   Community fitness   Stability/Clinical Decision Making Evolving/Moderate complexity    Clinical Decision Making Moderate    Rehab Potential Good    PT Frequency 2x / week    PT Duration 8 weeks   plus eval   PT Treatment/Interventions ADLs/Self Care Home Management;Aquatic Therapy;Moist Heat;Ultrasound;Cryotherapy;Scientist, product/process development;Neuromuscular re-education;Balance training;Therapeutic exercise;Therapeutic activities;Functional mobility training;Gait training;Patient/family education;Manual techniques;Passive range of motion    PT Next Visit Plan Initiate HEP to address gentle ROM and strengthening; manual/massage to IT band and to piriformis/superior thigh; instruct in gentle exercises; work on gait with walking pole for improved confidence with gait and mobility.    Recommended Other Services Requested that pt obtain Dr. Melven Sartorius notes/order for PT based on pt's reports of Dr. Melven Sartorius assessment    Consulted and Agree with Plan of Care Patient           Patient will benefit from skilled therapeutic intervention in order to improve the following deficits and impairments:  Abnormal gait,Difficulty walking,Decreased range of motion,Pain,Decreased balance,Impaired flexibility,Decreased mobility,Decreased strength,Postural dysfunction  Visit Diagnosis: Pain in right leg  Abnormal posture  Other symptoms and signs involving the musculoskeletal system  Other symptoms and signs involving the nervous system  Other abnormalities of gait and mobility     Problem List Patient Active Problem List   Diagnosis Date Noted  . Anemia 03/16/2020  . Coronary artery disease involving native  coronary artery of native heart without angina pectoris 06/12/2019  . Abnormal stress test   . Diastolic dysfunction 86/57/8469  . Cough 05/10/2018  . Rheumatoid arthritis ([place]) 01/08/2018  . Hand pain, right 10/15/2017  . Vitamin D deficiency 12/20/2016  . Prediabetes 12/20/2016  . Shortness of breath on exertion 11/27/2016  . Preventative health care 12/19/2015  . Constipation 12/07/2015  . BCC (basal cell carcinoma of skin) 10/05/2014  . Rosacea 10/05/2014  . Parkinson's disease ([place]) 05/11/2014  . Visual floaters 05/11/2014  . Paralysis agitans ([place]) 01/08/2014  . Screening for cervical cancer 07/17/2012  . Foot pain, bilateral 06/19/2012  . 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 Obesity 11/12/2009  . Depression with anxiety 11/02/2009  . Urinary tract infection 12/03/2008  . 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 Lumbar back pain with radiculopathy affecting lower extremity 10/17/2007  . Essential hypertension 07/05/2007  . Osteoarthritis 07/05/2007  . Hyperlipidemia 06/26/2007  . H/O: iron deficiency anemia 05/08/2007  . Hiatal hernia with GERD 05/08/2007    Chequita Mofield W. 01/12/2021, 3:20 PM  Frazier Butt., PT   [place] [address] [place], [place], [address] Phone: [phone]   Fax:  [phone]  Name: Kristin [name] MRN: [id number] Date of Birth: 10-30-1950  Physician: Dr. Gwendlyn Deutscher  Certification Start Date: 3/87/5643 Certification End Date: 04/11/2021  Physician Documentation Your signature is required to indicate approval of the treatment plan as stated above.  Please sign and either send electronically or make a copy of this report for your files and return this physician signed original.  Please mark one 1.__approve of plan   2. ___approve of plan with the followingconditions.  ____________________________________________________________________________________________________________________________________________   ______________________  _____________________ Physician Signature                                                                     Date    Faxed to MD for signature

## 2021-01-17 ENCOUNTER — Ambulatory Visit: Payer: Medicare Other | Admitting: Physical Therapy

## 2021-01-17 ENCOUNTER — Other Ambulatory Visit: Payer: Self-pay

## 2021-01-17 ENCOUNTER — Encounter: Payer: Self-pay | Admitting: Physical Therapy

## 2021-01-17 DIAGNOSIS — R293 Abnormal posture: Secondary | ICD-10-CM

## 2021-01-17 DIAGNOSIS — R2689 Other abnormalities of gait and mobility: Secondary | ICD-10-CM

## 2021-01-17 DIAGNOSIS — M79604 Pain in right leg: Secondary | ICD-10-CM | POA: Diagnosis not present

## 2021-01-17 DIAGNOSIS — R29898 Other symptoms and signs involving the musculoskeletal system: Secondary | ICD-10-CM | POA: Diagnosis not present

## 2021-01-17 DIAGNOSIS — R29818 Other symptoms and signs involving the nervous system: Secondary | ICD-10-CM | POA: Diagnosis not present

## 2021-01-17 NOTE — Patient Instructions (Signed)
Access Code: [email or web address] URL: [email or web address] Date: 01/17/2021 Prepared by: Mady Haagensen  Exercises Bent Knee Fallouts - 1 x daily - 5 x weekly - 1-2 sets - 10 reps Supine Hip Abduction - 1 x daily - 5 x weekly - 1-2 sets - 10 reps Quad Set - 1 x daily - 5 x weekly - 1-2 sets - 10 reps - 3 sec hold Supine Heel Slide - 1 x daily - 5 x weekly - 1-2 sets - 10 reps

## 2021-01-17 NOTE — Therapy (Signed)
[facility] [address] [place], [place], [address] Phone: [phone]   Fax:  [phone]  Physical Therapy Treatment  Patient Details  Name: Kristin [name] MRN: [id number] Date of Birth: 28-Apr-1951 Referring Provider (PT): Forestine Na, [place] (([facility]); has also seen Dr. Venetia Maxon)   Encounter Date: 01/17/2021   PT End of Session - 01/17/21 0806    Visit Number 2    Number of Visits 17    Date for PT Re-Evaluation 04/12/21   90 day cert for 8 wk POC   Authorization Type Medicare/[employer]    Progress Note Due on Visit 10    PT Start Time 0806    PT Stop Time 0844    PT Time Calculation (min) 38 min    Activity Tolerance Patient tolerated treatment well   Rates pain 3/10   Behavior During Therapy [facility] for tasks assessed/performed           Past Medical History:  Diagnosis Date  . Abnormal vaginal Pap smear   . Acute pharyngitis 02/25/2013  . Anemia   . Anxiety   . Arthritis   . 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 Chicken pox as a child  . Chronic UTI    sees dr Vonita Moss  . Constipation 12/07/2015  . Depression with anxiety 11/02/2009   Qualifier: Diagnosis of  By: Mayford Knife, LPN, Domenic Polite   . Dermatitis 07/17/2012  . Esophageal stricture 1994  . Fibroids   . Foot pain, bilateral 06/19/2012  . GERD (gastroesophageal reflux disease)   . GERD (gastroesophageal reflux disease)   . Hiatal hernia   . Hyperglycemia 08/19/2013  . 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 Hyperlipidemia   . Hypertension   . Infertility, female   . Low back pain 10/17/2007   Qualifier: Diagnosis of  By: Lovell Sheehan MD, [name]   . Measles as a child  . Obesity   . Osteoarthritis   . Parkinson disease (HCC)   . Plantar fasciitis of left foot 06/19/2012  . Preventative health care 12/19/2015  . Rheumatoid arthritis (HCC) 01/08/2018  . Rosacea 10/05/2014  . Swallowing difficulty   . Urinary frequency 02/25/2013  . Visual  floaters 05/11/2014    Past Surgical History:  Procedure Laterality Date  . ABDOMINAL HYSTERECTOMY  2006   total  . esophageal     stretching  . laporoscopy    . LEFT HEART CATH AND CORONARY ANGIOGRAPHY N/A 06/02/2019   Procedure: LEFT HEART CATH AND CORONARY ANGIOGRAPHY;  Surgeon: Corky Crafts, MD;  Location: [facility] INVASIVE CV LAB;  Service: Cardiovascular;  Laterality: N/A;  . TONSILLECTOMY    . TOTAL HIP ARTHROPLASTY Right 2010  . wisdom teeth extracted      There were no vitals filed for this visit.   Subjective Assessment - 01/17/21 0805    Subjective This is just slowing my down so much.  I don't want to go to baseball games or other things.  No other changes.    Pertinent History See problem list    Patient Stated Goals To get back in the pool; to get back strength in my legs.    Currently in Pain? Yes    Pain Score 4    8/10 first thing in the morning   Pain Location --   thigh   Pain Orientation Right    Pain Descriptors / Indicators Shooting;Sharp    Pain Type Acute pain  Pain Onset More than a month ago    Pain Frequency Constant    Aggravating Factors  walking, worse first thing in the morning; worse in the evening    Pain Relieving Factors heat, rest                             [facility] Adult PT Treatment/Exercise - 01/17/21 0001      Ambulation/Gait   Ambulation/Gait Yes    Ambulation/Gait Assistance 5: Supervision    Ambulation Distance (Feet) 230 Feet    Assistive device Other (Comment)   walking pole   Gait Pattern Step-through pattern;Decreased arm swing - right;Decreased step length - right;Decreased step length - left;Decreased stance time - right;Decreased weight shift to right;Antalgic;Narrow base of support    Ambulation Surface Level;Indoor    Gait Comments Cues with gait to focus on wider BOS, especially with RLE to avoid adduction, supination of R foot.  Pt reports pain is better with wider BOS.      Exercises   Exercises  Knee/Hip      Knee/Hip Exercises: Supine   Quad Sets Strengthening;Right;1 set;10 reps    Heel Slides Strengthening;Right;1 set;10 reps    Other Supine Knee/Hip Exercises Hooklying hip abduction, RLE x 10 reps    Other Supine Knee/Hip Exercises Hip abduction 10 reps RLE      Manual Therapy   Manual Therapy Other (comment);Soft tissue mobilization   Pain   Manual therapy comments Manual therapy/massage to R buttocks and hamstring area, using therapist's hand and using foam roller.  Focused area on piriformis area and mid-hamstring area.    Other Manual Therapy Additional massage along R iliotibial band-heel of hand>foam roller from greater trochanter to lateral thigh. Cross friction massage to middle area of IT band due to increased tightness.  Pt reports improvement in pain with massage to both areas.  Discussed possiblity of use of ice massage and use of foam roller at home.                  PT Education - 01/17/21 0854    Education Details initiated HEP-see instructions    Person(s) Educated Patient    Methods Explanation;Demonstration;Handout    Comprehension Verbalized understanding;Returned demonstration            PT Short Term Goals - 01/12/21 1335      PT SHORT TERM GOAL #1   Title Pt will be independent with HEP for improved posture, strength, decreased pain, and improved gait.  TARGET 02/11/2021    Time 4    Period Weeks    Status New      PT SHORT TERM GOAL #2   Title Pt will demonstrate improved flexibility in hamstrings by at least 5 degrees with no increase in pain,    Baseline -42 degrees from full extension/90/90 position    Time 4    Period Weeks    Status New      PT SHORT TERM GOAL #3   Title Pt will improve 5x sit<>stand score to less than or equal to 14 sec to improve functional strength and transfer efficiency.    Baseline 17.75 sec    Time 4    Period Weeks    Status New      PT SHORT TERM GOAL #4   Title Pt will report decrease in pain by  50% or more at worst, with gait activities, for improved functional mobility with decreased  pain.    Baseline 7/10 at worst    Time 4    Period Weeks    Status New             PT Long Term Goals - 01/12/21 1514      PT LONG TERM GOAL #1   Title Pt will be independent with progression of HEP for improved functional mobility, strength, balance, decreased pain.  TARGET 03/11/2021    Time 8    Period Weeks    Status New      PT LONG TERM GOAL #2   Title Pt will improve 5x sit<>stand to less than or equal to 12.5 seconds for improved functional strength.    Time 8    Period Weeks    Status New      PT LONG TERM GOAL #3   Title Pt will improve TUG score to less than or equal to 13.5 sec for decreased fall risk.    Time 8    Period Weeks    Status New      PT LONG TERM GOAL #4   Title Pt will improve gait velocity to at least 2.2 ft/sec for improved gait efficiency and safety.    Time 8    Period Weeks    Status New      PT LONG TERM GOAL #5   Title Pt will improve hamstring flexibility by 10 degrees from baseline, for improved overall functional mobility.    Time 8    Period Weeks    Status New                 Plan - 01/17/21 0854    Clinical Impression Statement Initiated HEP to address gentle strengthening about R hip and knee area.  Also focused skilled PT session on manual therapy/massage to posterior thigh and to IT band on RLE, with improvement in pain noted after massage and exercise.  Also cues with gait for wider BOS to improve gait pattern, as pt tends to adduct, internally rotate, supinate RLE, and this is improved with attention to placing R foot in a wider BOS position.  She is pleased at the end of session that pain is less, though it does still increase slightly with gait.    Personal Factors and Comorbidities Comorbidity 3+    Comorbidities See problem list    Examination-Activity Limitations Locomotion Level;Transfers;Squat;Stand     Examination-Participation Restrictions Community Activity;Shop;Other   Community fitness   Stability/Clinical Decision Making Evolving/Moderate complexity    Rehab Potential Good    PT Frequency 2x / week    PT Duration 8 weeks   plus eval   PT Treatment/Interventions ADLs/Self Care Home Management;Aquatic Therapy;Moist Heat;Ultrasound;Cryotherapy;Scientist, product/process development;Neuromuscular re-education;Balance training;Therapeutic exercise;Therapeutic activities;Functional mobility training;Gait training;Patient/family education;Manual techniques;Passive range of motion    PT Next Visit Plan Review HEP to address gentle ROM and strengthening; manual/massage/ice massage to IT band and to piriformis/superior thigh; instruct in gentle exercises; work on gait with walking pole for improved confidence with gait and mobility.    Consulted and Agree with Plan of Care Patient           Patient will benefit from skilled therapeutic intervention in order to improve the following deficits and impairments:  Abnormal gait,Difficulty walking,Decreased range of motion,Pain,Decreased balance,Impaired flexibility,Decreased mobility,Decreased strength,Postural dysfunction  Visit Diagnosis: Pain in right leg  Abnormal posture  Other symptoms and signs involving the musculoskeletal system  Other abnormalities of gait and mobility     Problem  List Patient Active Problem List   Diagnosis Date Noted  . Anemia 03/16/2020  . Coronary artery disease involving native coronary artery of native heart without angina pectoris 06/12/2019  . Abnormal stress test   . Diastolic dysfunction 74/04/1447  . Cough 05/10/2018  . Rheumatoid arthritis ([place]) 01/08/2018  . Hand pain, right 10/15/2017  . Vitamin D deficiency 12/20/2016  . Prediabetes 12/20/2016  . Shortness of breath on exertion 11/27/2016  . Preventative health care 12/19/2015  . Constipation 12/07/2015  . BCC (basal cell carcinoma of skin)  10/05/2014  . Rosacea 10/05/2014  . Parkinson's disease ([place]) 05/11/2014  . Visual floaters 05/11/2014  . Paralysis agitans ([place]) 01/08/2014  . Screening for cervical cancer 07/17/2012  . Foot pain, bilateral 06/19/2012  . 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 Obesity 11/12/2009  . Depression with anxiety 11/02/2009  . Urinary tract infection 12/03/2008  . 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 Lumbar back pain with radiculopathy affecting lower extremity 10/17/2007  . Essential hypertension 07/05/2007  . Osteoarthritis 07/05/2007  . Hyperlipidemia 06/26/2007  . H/O: iron deficiency anemia 05/08/2007  . Hiatal hernia with GERD 05/08/2007    Shandale Malak W. 01/17/2021, 8:57 AM Frazier Butt., PT  [facility] [address] [place], [place], [address] Phone: [phone]   Fax:  [phone]  Name: Kristin [name] MRN: [id number] Date of Birth: 12-Nov-1950

## 2021-01-21 ENCOUNTER — Other Ambulatory Visit: Payer: Self-pay

## 2021-01-21 ENCOUNTER — Encounter: Payer: Self-pay | Admitting: Physical Therapy

## 2021-01-21 ENCOUNTER — Ambulatory Visit: Payer: Medicare Other | Admitting: Physical Therapy

## 2021-01-21 DIAGNOSIS — R2689 Other abnormalities of gait and mobility: Secondary | ICD-10-CM | POA: Diagnosis not present

## 2021-01-21 DIAGNOSIS — R29818 Other symptoms and signs involving the nervous system: Secondary | ICD-10-CM | POA: Diagnosis not present

## 2021-01-21 DIAGNOSIS — R293 Abnormal posture: Secondary | ICD-10-CM

## 2021-01-21 DIAGNOSIS — M79604 Pain in right leg: Secondary | ICD-10-CM | POA: Diagnosis not present

## 2021-01-21 DIAGNOSIS — R29898 Other symptoms and signs involving the musculoskeletal system: Secondary | ICD-10-CM | POA: Diagnosis not present

## 2021-01-21 NOTE — Therapy (Signed)
Kristin [name] [address] [place], [place], [address] Phone: [phone]   Fax:  [phone]  Physical Therapy Treatment  Patient Details  Name: Kristin [name] MRN: [id number] Date of Birth: 08-30-1951 Referring Provider (PT): Gwendlyn Deutscher, [place] (([facility]); has also seen Dr. Vertell Limber)   Encounter Date: 01/21/2021   PT End of Session - 01/21/21 0753    Visit Number 3    Number of Visits 17    Date for PT Re-Evaluation 82/42/35   90 day cert for 8 wk POC   Authorization Type Medicare/[employer]    Progress Note Due on Visit 10    PT Start Time 0800    PT Stop Time 0845    PT Time Calculation (min) 45 min    Activity Tolerance Patient tolerated treatment well   Rates pain 3/10   Behavior During Therapy [facility] for tasks assessed/performed           Past Medical History:  Diagnosis Date  . Abnormal vaginal Pap smear   . Acute pharyngitis 02/25/2013  . Anemia   . Anxiety   . Arthritis   . 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 Chicken pox as a child  . Chronic UTI    sees dr Terance Hart  . Constipation 12/07/2015  . Depression with anxiety 11/02/2009   Qualifier: Diagnosis of  By: [name], LPN, [name]   . Dermatitis 07/17/2012  . Esophageal stricture 1994  . Fibroids   . Foot pain, bilateral 06/19/2012  . GERD (gastroesophageal reflux disease)   . GERD (gastroesophageal reflux disease)   . Hiatal hernia   . Hyperglycemia 08/19/2013  . 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 Hyperlipidemia   . Hypertension   . Infertility, female   . Low back pain 10/17/2007   Qualifier: Diagnosis of  By: [name], [name]   . Measles as a child  . Obesity   . Osteoarthritis   . Parkinson disease ([place])   . Plantar fasciitis of left foot 06/19/2012  . Preventative health care 12/19/2015  . Rheumatoid arthritis ([place]) 01/08/2018  . Rosacea 10/05/2014  . Swallowing difficulty   . Urinary frequency 02/25/2013  . Visual  floaters 05/11/2014    Past Surgical History:  Procedure Laterality Date  . ABDOMINAL HYSTERECTOMY  2006   total  . esophageal     stretching  . laporoscopy    . LEFT HEART CATH AND CORONARY ANGIOGRAPHY N/A 06/02/2019   Procedure: LEFT HEART CATH AND CORONARY ANGIOGRAPHY;  Surgeon: Jettie Booze, MD;  Location: [facility] CV LAB;  Service: Cardiovascular;  Laterality: N/A;  . TONSILLECTOMY    . TOTAL HIP ARTHROPLASTY Right 2010  . wisdom teeth extracted      There were no vitals filed for this visit.   Subjective Assessment - 01/21/21 0753    Subjective Been doing the exercises, and it feels better just after I'm done.    Pertinent History See problem list    Patient Stated Goals To get back in the pool; to get back strength in my legs.    Currently in Pain? Yes    Pain Score 3     Pain Location --   thigh   Pain Orientation Right    Pain Descriptors / Indicators Shooting;Sharp    Pain Type Acute pain    Pain Onset More than a month ago    Pain Frequency Constant    Aggravating Factors  walking, worse first thing in the morning    Pain Relieving Factors exercises, rest                             [facility] Adult PT Treatment/Exercise - 01/21/21 0001      Ambulation/Gait   Ambulation/Gait Yes    Ambulation/Gait Assistance 5: Supervision    Ambulation Distance (Feet) 230 Feet    Assistive device Other (Comment)   single walking pole   Gait Pattern Step-through pattern;Decreased arm swing - right;Decreased step length - right;Decreased step length - left;Decreased stance time - right;Decreased weight shift to right;Antalgic;Narrow base of support   increased supination RLE upon foot flat   Ambulation Surface Level;Indoor    Gait Comments Cues with gait to focus on wider BOS, especially with RLE to avoid adduction, supination of R foot.  Cues to focus on full foot placement, especially great toe on RLE with stance phase of gait to try to reduce supination.       Exercises   Exercises Knee/Hip;Ankle      Knee/Hip Exercises: Supine   Short Arc Quad Sets Strengthening;Right;1 set;10 reps    Other Supine Knee/Hip Exercises Hooklying march Bilat x 5 reps (pain in R hamstring)      Knee/Hip Exercises: Sidelying   Clams Sidelying clamshell hip abduction 10 reps      Manual Therapy   Manual Therapy Other (comment);Soft tissue mobilization   Pain   Manual therapy comments Manual therapy/massage to R buttocks and hamstring area, using therapist's hand and using foam roller.  Focused area on piriformis area and mid-hamstring area.    Other Manual Therapy Additional massage along R iliotibial band-heel of hand>foam roller from greater trochanter to lateral thigh. Cross friction massage to middle area of IT band due to increased tightness.  Ice massage along superior>middle aspect, focusing on mid-IT x 5 minutes to assist with pain and tightness.  Pt reports improvement in pain with massage/ice massage to both areas.      Ankle Exercises: Seated   Ankle Circles/Pumps Strengthening;Right;10 reps;Left   Ankle pumps to encourage dorsiflexion   Other Seated Ankle Exercises R ankle eversion x 10 reps    Other Seated Ankle Exercises Rolling R foot forward/back on foam roller (discussed use of pool noodle or water bottle at home) for stretching into toe extension.            Access Code: [email or web address] URL: [email or web address] Date: 01/17/2021 Prepared by: Lonia Blood   Exercises-Reviewed HEP, with pt demo understanding Bent Knee Fallouts - 1 x daily - 5 x weekly - 1-2 sets - 10 reps Supine Hip Abduction - 1 x daily - 5 x weekly - 1-2 sets - 10 reps Quad Set - 1 x daily - 5 x weekly - 1-2 sets - 10 reps - 3 sec hold Supine Heel Slide - 1 x daily - 5 x weekly - 1-2 sets - 10 reps       PT Education - 01/21/21 1158    Education Details Additions to HEP-see instructions    Person(s) Educated Patient    Methods  Explanation;Demonstration;Handout    Comprehension Verbalized understanding            PT Short Term Goals - 01/12/21 1335      PT SHORT TERM GOAL #1   Title Pt will be independent with HEP for improved posture, strength, decreased pain, and improved gait.  TARGET 02/11/2021  Time 4    Period Weeks    Status New      PT SHORT TERM GOAL #2   Title Pt will demonstrate improved flexibility in hamstrings by at least 5 degrees with no increase in pain,    Baseline -42 degrees from full extension/90/90 position    Time 4    Period Weeks    Status New      PT SHORT TERM GOAL #3   Title Pt will improve 5x sit<>stand score to less than or equal to 14 sec to improve functional strength and transfer efficiency.    Baseline 17.75 sec    Time 4    Period Weeks    Status New      PT SHORT TERM GOAL #4   Title Pt will report decrease in pain by 50% or more at worst, with gait activities, for improved functional mobility with decreased pain.    Baseline 7/10 at worst    Time 4    Period Weeks    Status New             PT Long Term Goals - 01/12/21 1514      PT LONG TERM GOAL #1   Title Pt will be independent with progression of HEP for improved functional mobility, strength, balance, decreased pain.  TARGET 03/11/2021    Time 8    Period Weeks    Status New      PT LONG TERM GOAL #2   Title Pt will improve 5x sit<>stand to less than or equal to 12.5 seconds for improved functional strength.    Time 8    Period Weeks    Status New      PT LONG TERM GOAL #3   Title Pt will improve TUG score to less than or equal to 13.5 sec for decreased fall risk.    Time 8    Period Weeks    Status New      PT LONG TERM GOAL #4   Title Pt will improve gait velocity to at least 2.2 ft/sec for improved gait efficiency and safety.    Time 8    Period Weeks    Status New      PT LONG TERM GOAL #5   Title Pt will improve hamstring flexibility by 10 degrees from baseline, for improved  overall functional mobility.    Time 8    Period Weeks    Status New                 Plan - 01/21/21 1159    Clinical Impression Statement Continued with massage to posterior thigh and massage/ice massage to IT band on RLE, with improvement noted after massage and after exercises.  Reviewed HEP and added exercises to further address R hip and ankle for improved gait pattern.  Pt overall rates pain as 3/10 upon beginning of session and 5/10 in mornings this week (which are both better than pain ratings last session).  She will continue to benefit from skilled PT to further address strength, decreased pain, for improved overall functional mobility.    Personal Factors and Comorbidities Comorbidity 3+    Comorbidities See problem list    Examination-Activity Limitations Locomotion Level;Transfers;Squat;Stand    Examination-Participation Restrictions Community Activity;Shop;Other   Community fitness   Stability/Clinical Decision Making Evolving/Moderate complexity    Rehab Potential Good    PT Frequency 2x / week    PT Duration 8 weeks   plus eval  PT Treatment/Interventions ADLs/Self Care Home Management;Aquatic Therapy;Moist Heat;Ultrasound;Cryotherapy;Scientist, product/process development;Neuromuscular re-education;Balance training;Therapeutic exercise;Therapeutic activities;Functional mobility training;Gait training;Patient/family education;Manual techniques;Passive range of motion    PT Next Visit Plan Review updates to HEP to address gentle ROM and strengthening and progress sets; manual/massage/ice massage to IT band (formally educate pt on how to perform at home) and to piriformis/superior thigh; work on gait with walking pole for improved confidence with gait and mobility.    Consulted and Agree with Plan of Care Patient           Patient will benefit from skilled therapeutic intervention in order to improve the following deficits and impairments:  Abnormal gait,Difficulty  walking,Decreased range of motion,Pain,Decreased balance,Impaired flexibility,Decreased mobility,Decreased strength,Postural dysfunction  Visit Diagnosis: Pain in right leg  Abnormal posture  Other symptoms and signs involving the musculoskeletal system  Other abnormalities of gait and mobility     Problem List Patient Active Problem List   Diagnosis Date Noted  . Anemia 03/16/2020  . Coronary artery disease involving native coronary artery of native heart without angina pectoris 06/12/2019  . Abnormal stress test   . Diastolic dysfunction 37/16/9678  . Cough 05/10/2018  . Rheumatoid arthritis ([place]) 01/08/2018  . Hand pain, right 10/15/2017  . Vitamin D deficiency 12/20/2016  . Prediabetes 12/20/2016  . Shortness of breath on exertion 11/27/2016  . Preventative health care 12/19/2015  . Constipation 12/07/2015  . BCC (basal cell carcinoma of skin) 10/05/2014  . Rosacea 10/05/2014  . Parkinson's disease ([place]) 05/11/2014  . Visual floaters 05/11/2014  . Paralysis agitans ([place]) 01/08/2014  . Screening for cervical cancer 07/17/2012  . Foot pain, bilateral 06/19/2012  . 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 Obesity 11/12/2009  . Depression with anxiety 11/02/2009  . Urinary tract infection 12/03/2008  . 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 Lumbar back pain with radiculopathy affecting lower extremity 10/17/2007  . Essential hypertension 07/05/2007  . Osteoarthritis 07/05/2007  . Hyperlipidemia 06/26/2007  . H/O: iron deficiency anemia 05/08/2007  . Hiatal hernia with GERD 05/08/2007    Demiyah Fischbach W. 01/21/2021, 12:03 PM  Frazier Butt., PT   [facility] [address] [place], [place], [address] Phone: [phone]   Fax:  [phone]  Name: HINDY PERRAULT MRN: [id number] Date of Birth: 1950/09/24

## 2021-01-21 NOTE — Patient Instructions (Addendum)
Access Code: [email or web address] URL: [email or web address] Date: 01/21/2021 Prepared by: Mady Haagensen  Exercises Supine Hip Abduction - 1 x daily - 5 x weekly - 1-2 sets - 10 reps Quad Set - 1 x daily - 5 x weekly - 1-2 sets - 10 reps - 3 sec hold Supine Heel Slide - 1 x daily - 5 x weekly - 1-2 sets - 10 reps Clamshell - 1 x daily - 5 x weekly - 1-2 sets - 10 reps  Added 01/21/21 Seated Toe Raise - 1 x daily - 5 x weekly - 1-2 sets - 10 reps   (wrote in seated ankle eversion, 1-2 sets, 10 reps)  (wrote in R foot rolling pool noodle/water bottle forward/back for foot stretching)

## 2021-01-24 DIAGNOSIS — Z9889 Other specified postprocedural states: Secondary | ICD-10-CM | POA: Diagnosis not present

## 2021-01-24 DIAGNOSIS — K449 Diaphragmatic hernia without obstruction or gangrene: Secondary | ICD-10-CM | POA: Diagnosis not present

## 2021-01-24 DIAGNOSIS — Z6835 Body mass index (BMI) 35.0-35.9, adult: Secondary | ICD-10-CM | POA: Diagnosis not present

## 2021-01-24 DIAGNOSIS — E6609 Other obesity due to excess calories: Secondary | ICD-10-CM | POA: Diagnosis not present

## 2021-01-25 ENCOUNTER — Other Ambulatory Visit: Payer: Medicare Other

## 2021-01-25 ENCOUNTER — Other Ambulatory Visit: Payer: Self-pay

## 2021-01-25 ENCOUNTER — Ambulatory Visit
Admission: RE | Admit: 2021-01-25 | Discharge: 2021-01-25 | Disposition: A | Discharge status: Home / Self Care | Payer: Medicare Other | Source: Ambulatory Visit | Attending: Family Medicine | Admitting: Family Medicine

## 2021-01-25 ENCOUNTER — Ambulatory Visit: Payer: Medicare Other | Admitting: Physical Therapy

## 2021-01-25 ENCOUNTER — Encounter: Payer: Self-pay | Admitting: Physical Therapy

## 2021-01-25 DIAGNOSIS — N6489 Other specified disorders of breast: Secondary | ICD-10-CM | POA: Diagnosis not present

## 2021-01-25 DIAGNOSIS — R29898 Other symptoms and signs involving the musculoskeletal system: Secondary | ICD-10-CM | POA: Diagnosis not present

## 2021-01-25 DIAGNOSIS — R293 Abnormal posture: Secondary | ICD-10-CM

## 2021-01-25 DIAGNOSIS — R2689 Other abnormalities of gait and mobility: Secondary | ICD-10-CM

## 2021-01-25 DIAGNOSIS — R922 Inconclusive mammogram: Secondary | ICD-10-CM | POA: Diagnosis not present

## 2021-01-25 DIAGNOSIS — M79604 Pain in right leg: Secondary | ICD-10-CM | POA: Diagnosis not present

## 2021-01-25 DIAGNOSIS — R29818 Other symptoms and signs involving the nervous system: Secondary | ICD-10-CM | POA: Diagnosis not present

## 2021-01-25 NOTE — Therapy (Signed)
[facility] [address] [place], [place], [address] Phone: [phone]   Fax:  [phone]  Physical Therapy Treatment  Patient Details  Name: Kristin [name] MRN: [id number] Date of Birth: 1950-10-25 Referring Provider (PT): Gwendlyn Deutscher, [place] (([facility]); has also seen Dr. Vertell Limber)   Encounter Date: 01/25/2021   PT End of Session - 01/25/21 0803    Visit Number 4    Number of Visits 17    Date for PT Re-Evaluation 87/86/76   90 day cert for 8 wk POC   Authorization Type Medicare/[employer]    Progress Note Due on Visit 10    PT Start Time 0804    PT Stop Time 0846    PT Time Calculation (min) 42 min    Activity Tolerance Patient tolerated treatment well   no pain in hamstrings, decrease in ITB pain at end of session   Behavior During Therapy [facility] for tasks assessed/performed           Past Medical History:  Diagnosis Date  . Abnormal vaginal Pap smear   . Acute pharyngitis 02/25/2013  . Anemia   . Anxiety   . Arthritis   . 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 Chicken pox as a child  . Chronic UTI    sees dr Terance Hart  . Constipation 12/07/2015  . Depression with anxiety 11/02/2009   Qualifier: Diagnosis of  By: [name], LPN, [name]   . Dermatitis 07/17/2012  . Esophageal stricture 1994  . Fibroids   . Foot pain, bilateral 06/19/2012  . GERD (gastroesophageal reflux disease)   . GERD (gastroesophageal reflux disease)   . Hiatal hernia   . Hyperglycemia 08/19/2013  . 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 Hyperlipidemia   . Hypertension   . Infertility, female   . Low back pain 10/17/2007   Qualifier: Diagnosis of  By: [name], [name]   . Measles as a child  . Obesity   . Osteoarthritis   . Parkinson disease ([place])   . Plantar fasciitis of left foot 06/19/2012  . Preventative health care 12/19/2015  . Rheumatoid arthritis ([place]) 01/08/2018  . Rosacea 10/05/2014  . Swallowing difficulty    . Urinary frequency 02/25/2013  . Visual floaters 05/11/2014    Past Surgical History:  Procedure Laterality Date  . ABDOMINAL HYSTERECTOMY  2006   total  . esophageal     stretching  . laporoscopy    . LEFT HEART CATH AND CORONARY ANGIOGRAPHY N/A 06/02/2019   Procedure: LEFT HEART CATH AND CORONARY ANGIOGRAPHY;  Surgeon: Jettie Booze, MD;  Location: [facility];  Service: Cardiovascular;  Laterality: N/A;  . TONSILLECTOMY    . TOTAL HIP ARTHROPLASTY Right 2010  . wisdom teeth extracted      There were no vitals filed for this visit.   Subjective Assessment - 01/25/21 0803    Subjective Saw the doctor at [facility] yesterday (surgeon from hernia).  She said I can do any exercise except for core activation (like yoga).  Working hard to walk with my foot out, but it feels so much better.  Exercises overall make me feel better.    Pertinent History See problem list    Patient Stated Goals To get back in the pool; to get back strength in my legs.    Currently in Pain? Yes    Pain Score 0-No pain   early morning:  4/10  Pain Location Other (Comment)   thigh   Pain Orientation Right    Pain Descriptors / Indicators Aching;Tightness    Pain Type Acute pain    Pain Onset More than a month ago    Pain Frequency Intermittent    Aggravating Factors  walking long distance, worse first thing in the morning    Pain Relieving Factors heat, rest, exercises                             [facility] Adult PT Treatment/Exercise - 01/25/21 0001      Ambulation/Gait   Ambulation/Gait Yes    Ambulation/Gait Assistance 5: Supervision    Ambulation/Gait Assistance Details 1 lap walking pole, 2 laps walking pole    Ambulation Distance (Feet) 345 Feet    Assistive device Other (Comment)   single walking pole   Gait Pattern Step-through pattern;Decreased arm swing - right;Decreased step length - right;Decreased step length - left;Decreased stance time - right;Decreased weight  shift to right;Antalgic;Narrow base of support   increased supination R foot flat; pt improves with cues/attention to placement   Ambulation Surface Level;Indoor    Gait Comments Initial cues with gait to focus on wider BOS, especially with RLE to avoid adduction, supination of R foot.  Cues to focus on full foot placement, especially great toe on RLE with stance phase of gait to try to reduce supination.  Tactile cues to relax RUE      Exercises   Exercises Knee/Hip;Ankle      Knee/Hip Exercises: Stretches   Passive Hamstring Stretch Right;2 reps;10 seconds    Passive Hamstring Stretch Limitations Supine position, using towel to pull knee towards chest, then gentle stretch with knee extension    Piriformis Stretch Right;3 reps;10 seconds;Limitations   5-10sec, pt holds sheet to pull leg up in supine   Piriformis Stretch Limitations Seated position, 2 reps, 10 sec   Pt performs seated on R and L, improved ease on L     Knee/Hip Exercises: Supine   Short Arc Quad Sets Strengthening;Right;10 reps;2 sets    Short Arc Quad Sets Limitations PT has to assist with neutral placement RLE (tends to be adducted and plantarflexed)    Other Supine Knee/Hip Exercises Hamstring sets 2 sets x 10 reps      Manual Therapy   Manual Therapy Other (comment);Soft tissue mobilization   Pain/tightness   Manual therapy comments Manual therapy/massage to R buttocks,  and hamstring area, using therapist's hand and using foam roller.  Focused area on piriformis area and mid-hamstring area.    Other Manual Therapy Additional massage along R iliotibial band-heel of hand>foam roller from greater trochanter to lateral thigh. Cross friction massage to middle area of IT band due to increased tightness. Ended with massage with heel of hand along ITB, with decreased tightness noted along mid aspect of ITB.  Pt reports improvement in pain with massage.            Reviewed HEP, with pt return demo understanding Clamshell - 1  x daily - 5 x weekly - 1-2 sets - 10 reps   Added 01/21/21 Seated Toe Raise - 1 x daily - 5 x weekly - 1-2 sets - 10 reps -pt return demo             (wrote in seated ankle eversion, 1-2 sets, 10 reps)-pt return demo             (wrote  in R foot rolling pool noodle/water bottle forward/back for foot stretching)-pt reports doing this with ease at home.       PT Education - 01/25/21 1031    Education Details Additions to HEP-see instructions; continue use of foam roller at home and use of pillow in sidelying sleeping position    Person(s) Educated Patient    Methods Explanation;Demonstration;Handout    Comprehension Returned demonstration;Verbalized understanding            PT Short Term Goals - 01/12/21 1335      PT SHORT TERM GOAL #1   Title Pt will be independent with HEP for improved posture, strength, decreased pain, and improved gait.  TARGET 02/11/2021    Time 4    Period Weeks    Status New      PT SHORT TERM GOAL #2   Title Pt will demonstrate improved flexibility in hamstrings by at least 5 degrees with no increase in pain,    Baseline -42 degrees from full extension/90/90 position    Time 4    Period Weeks    Status New      PT SHORT TERM GOAL #3   Title Pt will improve 5x sit<>stand score to less than or equal to 14 sec to improve functional strength and transfer efficiency.    Baseline 17.75 sec    Time 4    Period Weeks    Status New      PT SHORT TERM GOAL #4   Title Pt will report decrease in pain by 50% or more at worst, with gait activities, for improved functional mobility with decreased pain.    Baseline 7/10 at worst    Time 4    Period Weeks    Status New             PT Long Term Goals - 01/12/21 1514      PT LONG TERM GOAL #1   Title Pt will be independent with progression of HEP for improved functional mobility, strength, balance, decreased pain.  TARGET 03/11/2021    Time 8    Period Weeks    Status New      PT LONG TERM GOAL #2    Title Pt will improve 5x sit<>stand to less than or equal to 12.5 seconds for improved functional strength.    Time 8    Period Weeks    Status New      PT LONG TERM GOAL #3   Title Pt will improve TUG score to less than or equal to 13.5 sec for decreased fall risk.    Time 8    Period Weeks    Status New      PT LONG TERM GOAL #4   Title Pt will improve gait velocity to at least 2.2 ft/sec for improved gait efficiency and safety.    Time 8    Period Weeks    Status New      PT LONG TERM GOAL #5   Title Pt will improve hamstring flexibility by 10 degrees from baseline, for improved overall functional mobility.    Time 8    Period Weeks    Status New                 Plan - 01/25/21 1032    Clinical Impression Statement Pt continues to rate decrease in overall pain in R posterior thigh, with decreased pain in ITB and piriformis but these two areas have continued discomfort.  With  more aggressive use of foam roller and heel of hand today along ITB, decreased tightness noted with palpation at end of session.  In addition to massage and gentle strengthening, added stretching to piriformis and gentle hamstring stretch.  No discomfort noted throughout therapy today, with pt reporting feeling overall looser at end of session.  Still with guarded gait pattern, and will continue to address for overall improved functional mobility and confidence with gait.    Personal Factors and Comorbidities Comorbidity 3+    Comorbidities See problem list    Examination-Activity Limitations Locomotion Level;Transfers;Squat;Stand    Examination-Participation Restrictions Community Activity;Shop;Other   Community fitness   Stability/Clinical Decision Making Evolving/Moderate complexity    Rehab Potential Good    PT Frequency 2x / week    PT Duration 8 weeks   plus eval   PT Treatment/Interventions ADLs/Self Care Home Management;Aquatic Therapy;Moist Heat;Ultrasound;Cryotherapy;Geophysical data processor;Neuromuscular re-education;Balance training;Therapeutic exercise;Therapeutic activities;Functional mobility training;Gait training;Patient/family education;Manual techniques;Passive range of motion    PT Next Visit Plan Review updates to HEP to address gentle ROM and strengthening and progress sets; manual/massage/ice massage to IT band and to piriformis/superior thigh; work on gait with walking pole for improved confidence with gait and mobility.    Consulted and Agree with Plan of Care Patient           Patient will benefit from skilled therapeutic intervention in order to improve the following deficits and impairments:  Abnormal gait,Difficulty walking,Decreased range of motion,Pain,Decreased balance,Impaired flexibility,Decreased mobility,Decreased strength,Postural dysfunction  Visit Diagnosis: Pain in right leg  Other abnormalities of gait and mobility  Other symptoms and signs involving the musculoskeletal system  Abnormal posture     Problem List Patient Active Problem List   Diagnosis Date Noted  . Anemia 03/16/2020  . Coronary artery disease involving native coronary artery of native heart without angina pectoris 06/12/2019  . Abnormal stress test   . Diastolic dysfunction 92/44/6286  . Cough 05/10/2018  . Rheumatoid arthritis ([place]) 01/08/2018  . Hand pain, right 10/15/2017  . Vitamin D deficiency 12/20/2016  . Prediabetes 12/20/2016  . Shortness of breath on exertion 11/27/2016  . Preventative health care 12/19/2015  . Constipation 12/07/2015  . BCC (basal cell carcinoma of skin) 10/05/2014  . Rosacea 10/05/2014  . Parkinson's disease ([place]) 05/11/2014  . Visual floaters 05/11/2014  . Paralysis agitans ([place]) 01/08/2014  . Screening for cervical cancer 07/17/2012  . Foot pain, bilateral 06/19/2012  . 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 Obesity 11/12/2009  . Depression with anxiety 11/02/2009  . Urinary tract infection 12/03/2008  .  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 Lumbar back pain with radiculopathy affecting lower extremity 10/17/2007  . Essential hypertension 07/05/2007  . Osteoarthritis 07/05/2007  . Hyperlipidemia 06/26/2007  . H/O: iron deficiency anemia 05/08/2007  . Hiatal hernia with GERD 05/08/2007    Luella Gardenhire W. 01/25/2021, 10:38 AM Frazier Butt., PT  [facility] [address] [place], [place], [address] Phone: [phone]   Fax:  [phone]  Name: Kristin [name] MRN: [id number] Date of Birth: May 12, 1951

## 2021-01-25 NOTE — Patient Instructions (Signed)
Access Code: [email or web address] URL: [email or web address] Date: 01/25/2021 Prepared by: Mady Haagensen  Exercises Supine Hip Abduction - 1 x daily - 5 x weekly - 1-2 sets - 10 reps Quad Set - 1 x daily - 5 x weekly - 1-2 sets - 10 reps - 3 sec hold Supine Heel Slide - 1 x daily - 5 x weekly - 1-2 sets - 10 reps Clamshell - 1 x daily - 5 x weekly - 1-2 sets - 10 reps Seated Toe Raise - 1 x daily - 5 x weekly - 1-2 sets - 10 reps  Added 01/25/2021: Seated Figure 4 Piriformis Stretch - 1-2 x daily - 7 x weekly - 1 sets - 3 reps - 10-15 sec hold

## 2021-01-27 ENCOUNTER — Other Ambulatory Visit: Payer: Self-pay | Admitting: Neurology

## 2021-01-27 ENCOUNTER — Other Ambulatory Visit: Payer: Self-pay | Admitting: Family Medicine

## 2021-01-28 ENCOUNTER — Other Ambulatory Visit: Payer: Self-pay

## 2021-01-28 ENCOUNTER — Encounter: Payer: Self-pay | Admitting: Physical Therapy

## 2021-01-28 ENCOUNTER — Other Ambulatory Visit: Payer: Self-pay | Admitting: Neurology

## 2021-01-28 ENCOUNTER — Ambulatory Visit: Payer: Medicare Other | Admitting: Physical Therapy

## 2021-01-28 DIAGNOSIS — R29898 Other symptoms and signs involving the musculoskeletal system: Secondary | ICD-10-CM | POA: Diagnosis not present

## 2021-01-28 DIAGNOSIS — R293 Abnormal posture: Secondary | ICD-10-CM

## 2021-01-28 DIAGNOSIS — M79604 Pain in right leg: Secondary | ICD-10-CM

## 2021-01-28 DIAGNOSIS — R29818 Other symptoms and signs involving the nervous system: Secondary | ICD-10-CM | POA: Diagnosis not present

## 2021-01-28 DIAGNOSIS — R2689 Other abnormalities of gait and mobility: Secondary | ICD-10-CM | POA: Diagnosis not present

## 2021-01-28 NOTE — Therapy (Signed)
[facility] [address] [place], [place], [address] Phone: [phone]   Fax:  [phone]  Physical Therapy Treatment  Patient Details  Name: Kristin [name] MRN: [id number] Date of Birth: March 02, 1951 Referring Provider (PT): Gwendlyn Deutscher, [place] (([facility]); has also seen Dr. Vertell Limber)   Encounter Date: 01/28/2021   PT End of Session - 01/28/21 0804    Visit Number 5    Number of Visits 17    Date for PT Re-Evaluation 81/01/75   90 day cert for 8 wk POC   Authorization Type Medicare/[employer]    Progress Note Due on Visit 10    PT Start Time 0805    PT Stop Time 0847    PT Time Calculation (min) 42 min    Activity Tolerance Patient tolerated treatment well   no pain in hamstrings, decrease in ITB pain at end of session   Behavior During Therapy [facility] for tasks assessed/performed           Past Medical History:  Diagnosis Date  . Abnormal vaginal Pap smear   . Acute pharyngitis 02/25/2013  . Anemia   . Anxiety   . Arthritis   . 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 Chicken pox as a child  . Chronic UTI    sees dr Terance Hart  . Constipation 12/07/2015  . Depression with anxiety 11/02/2009   Qualifier: Diagnosis of  By: [name], LPN, [name]   . Dermatitis 07/17/2012  . Esophageal stricture 1994  . Fibroids   . Foot pain, bilateral 06/19/2012  . GERD (gastroesophageal reflux disease)   . GERD (gastroesophageal reflux disease)   . Hiatal hernia   . Hyperglycemia 08/19/2013  . 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 Hyperlipidemia   . Hypertension   . Infertility, female   . Low back pain 10/17/2007   Qualifier: Diagnosis of  By: [name], [name]   . Measles as a child  . Obesity   . Osteoarthritis   . Parkinson disease ([place])   . Plantar fasciitis of left foot 06/19/2012  . Preventative health care 12/19/2015  . Rheumatoid arthritis ([place]) 01/08/2018  . Rosacea 10/05/2014  . Swallowing difficulty    . Urinary frequency 02/25/2013  . Visual floaters 05/11/2014    Past Surgical History:  Procedure Laterality Date  . ABDOMINAL HYSTERECTOMY  2006   total  . esophageal     stretching  . laporoscopy    . LEFT HEART CATH AND CORONARY ANGIOGRAPHY N/A 06/02/2019   Procedure: LEFT HEART CATH AND CORONARY ANGIOGRAPHY;  Surgeon: Jettie Booze, MD;  Location: [facility];  Service: Cardiovascular;  Laterality: N/A;  . TONSILLECTOMY    . TOTAL HIP ARTHROPLASTY Right 2010  . wisdom teeth extracted      There were no vitals filed for this visit.   Subjective Assessment - 01/28/21 0806    Subjective Got some new shoes-they helped me find some stable/supportive sneakers.  Have a little pull pain in my hamstring this morning.  When I do my exercises, it feels better.    Pertinent History See problem list    Patient Stated Goals To get back in the pool; to get back strength in my legs.    Currently in Pain? Yes    Pain Score 2     Pain Location Other (Comment)   thigh   Pain Orientation Right    Pain Descriptors / Indicators Aching;Tightness  pulling   Pain Type Acute pain    Pain Onset More than a month ago    Pain Frequency Intermittent    Aggravating Factors  walking long distances, worse first thing in the morning; sitting too long    Pain Relieving Factors ice, rest, exercises                             [facility] Adult PT Treatment/Exercise - 01/28/21 0001      Exercises   Exercises Knee/Hip;Ankle      Knee/Hip Exercises: Stretches   Passive Hamstring Stretch Right;3 reps;20 seconds    Passive Hamstring Stretch Limitations Supine position, using sheet to pull knee towards chest, then gentle stretch with knee extension (x 2 reps), then sheet around foot and pull in SLR position for gentle hamstring stretch, 3 reps.    Piriformis Stretch Right;3 reps;10 seconds;Limitations    Piriformis Stretch Limitations Reviewed addition to HEP, and pt return demo  understanding.    Other Knee/Hip Stretches Single knee to chest stretch, RLE, using sheet to assist, x 3 reps, 10-15 sec      Knee/Hip Exercises: Standing   Functional Squat 3 sets;5 reps;Limitations    Functional Squat Limitations Cues for technique (hinge at hips with gentle knee bend)      Knee/Hip Exercises: Seated   Other Seated Knee/Hip Exercises RLE step out and in, x 10 reps, cues to not come in past midline      Knee/Hip Exercises: Supine   Short Arc Quad Sets Strengthening;Right;10 reps;3 sets    Short Arc Quad Sets Limitations VCs for neutral foot and leg positioning    Straight Leg Raises Strengthening;2 sets;10 reps;Right    Other Supine Knee/Hip Exercises RLE Hamstring sets 2 sets x 10 reps      Knee/Hip Exercises: Sidelying   Hip ABduction Strengthening;Right;2 sets;10 reps    Hip ABduction Limitations Cues for neutral foot position      Manual Therapy   Manual Therapy Other (comment);Soft tissue mobilization   Massage for pain/tightness   Manual therapy comments Manual therapy/massage to R buttocks,  and hamstring area, using heel of therapist's hand and using foam roller.  Focused area on piriformis area and mid-hamstring area.    Other Manual Therapy Additional massage along R iliotibial band-heel of hand>foam roller from greater trochanter to lateral thigh. Cross friction massage to middle area of IT band due to increased tightness. Ended with massage with heel of hand along ITB, with decreased tightness noted along mid aspect of ITB.  Pt continues to report improvement in pain with massage.              Therapeutic Exercise Ankle eversion, towel slides, 2 sets x 3 reps, RLE, with cues for technique to focus on eversion/neutral foot position.  Pt c/o fatigue in lower leg after exercises.       PT Short Term Goals - 01/12/21 1335      PT SHORT TERM GOAL #1   Title Pt will be independent with HEP for improved posture, strength, decreased pain, and improved  gait.  TARGET 02/11/2021    Time 4    Period Weeks    Status New      PT SHORT TERM GOAL #2   Title Pt will demonstrate improved flexibility in hamstrings by at least 5 degrees with no increase in pain,    Baseline -42 degrees from full extension/90/90 position    Time  4    Period Weeks    Status New      PT SHORT TERM GOAL #3   Title Pt will improve 5x sit<>stand score to less than or equal to 14 sec to improve functional strength and transfer efficiency.    Baseline 17.75 sec    Time 4    Period Weeks    Status New      PT SHORT TERM GOAL #4   Title Pt will report decrease in pain by 50% or more at worst, with gait activities, for improved functional mobility with decreased pain.    Baseline 7/10 at worst    Time 4    Period Weeks    Status New             PT Long Term Goals - 01/12/21 1514      PT LONG TERM GOAL #1   Title Pt will be independent with progression of HEP for improved functional mobility, strength, balance, decreased pain.  TARGET 03/11/2021    Time 8    Period Weeks    Status New      PT LONG TERM GOAL #2   Title Pt will improve 5x sit<>stand to less than or equal to 12.5 seconds for improved functional strength.    Time 8    Period Weeks    Status New      PT LONG TERM GOAL #3   Title Pt will improve TUG score to less than or equal to 13.5 sec for decreased fall risk.    Time 8    Period Weeks    Status New      PT LONG TERM GOAL #4   Title Pt will improve gait velocity to at least 2.2 ft/sec for improved gait efficiency and safety.    Time 8    Period Weeks    Status New      PT LONG TERM GOAL #5   Title Pt will improve hamstring flexibility by 10 degrees from baseline, for improved overall functional mobility.    Time 8    Period Weeks    Status New                 Plan - 01/28/21 0911    Clinical Impression Statement Continued to address pain in posterior thigh and along ITB/piriformis.  Pt is continueing to rate overall  improvement with pain, and PT able to add additional gentle strengthening and stretching today.  Pt rates pain today 2/10 throughout, and reports feeling better at end of session.  Pt needs reminder cues throughout session for more neutral position of RLE, as it appears with resting tremor/?dystonia due to Parkinson's, that she continues to have preference for internal rotation, supination/plantarflexion.    Personal Factors and Comorbidities Comorbidity 3+    Comorbidities See problem list    Examination-Activity Limitations Locomotion Level;Transfers;Squat;Stand    Examination-Participation Restrictions Community Activity;Shop;Other   Community fitness   Stability/Clinical Decision Making Evolving/Moderate complexity    Rehab Potential Good    PT Frequency 2x / week    PT Duration 8 weeks   plus eval   PT Treatment/Interventions ADLs/Self Care Home Management;Aquatic Therapy;Moist Heat;Ultrasound;Cryotherapy;Scientist, product/process development;Neuromuscular re-education;Balance training;Therapeutic exercise;Therapeutic activities;Functional mobility training;Gait training;Patient/family education;Manual techniques;Passive range of motion    PT Next Visit Plan Continue to address gentle flexbility and strengthening and progress sets; manual/massage/ice massage to IT band and to piriformis/superior thigh; work on gait with walking pole for improved confidence with gait and mobility.  Consulted and Agree with Plan of Care Patient           Patient will benefit from skilled therapeutic intervention in order to improve the following deficits and impairments:  Abnormal gait,Difficulty walking,Decreased range of motion,Pain,Decreased balance,Impaired flexibility,Decreased mobility,Decreased strength,Postural dysfunction  Visit Diagnosis: Other symptoms and signs involving the musculoskeletal system  Pain in right leg  Abnormal posture     Problem List Patient Active Problem List    Diagnosis Date Noted  . Anemia 03/16/2020  . Coronary artery disease involving native coronary artery of native heart without angina pectoris 06/12/2019  . Abnormal stress test   . Diastolic dysfunction 79/15/0569  . Cough 05/10/2018  . Rheumatoid arthritis ([place]) 01/08/2018  . Hand pain, right 10/15/2017  . Vitamin D deficiency 12/20/2016  . Prediabetes 12/20/2016  . Shortness of breath on exertion 11/27/2016  . Preventative health care 12/19/2015  . Constipation 12/07/2015  . BCC (basal cell carcinoma of skin) 10/05/2014  . Rosacea 10/05/2014  . Parkinson's disease ([place]) 05/11/2014  . Visual floaters 05/11/2014  . Paralysis agitans ([place]) 01/08/2014  . Screening for cervical cancer 07/17/2012  . Foot pain, bilateral 06/19/2012  . 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 Obesity 11/12/2009  . Depression with anxiety 11/02/2009  . Urinary tract infection 12/03/2008  . 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 Lumbar back pain with radiculopathy affecting lower extremity 10/17/2007  . Essential hypertension 07/05/2007  . Osteoarthritis 07/05/2007  . Hyperlipidemia 06/26/2007  . H/O: iron deficiency anemia 05/08/2007  . Hiatal hernia with GERD 05/08/2007    Adron Geisel W. 01/28/2021, 9:15 AM Mady Haagensen, PT 01/28/21 9:16 AM Phone: [phone] Fax: [facility] [address] [place], [place], [address] Phone: [phone]   Fax:  [phone]  Name: Kristin [name] MRN: [id number] Date of Birth: May 02, 1951

## 2021-02-02 ENCOUNTER — Ambulatory Visit: Payer: Medicare Other | Admitting: Physical Therapy

## 2021-02-02 NOTE — Telephone Encounter (Signed)
Enough given till follow up July 29,2022 with Dr.Rebecca Tat

## 2021-02-03 ENCOUNTER — Encounter: Payer: Self-pay | Admitting: Physical Therapy

## 2021-02-03 ENCOUNTER — Other Ambulatory Visit: Payer: Self-pay

## 2021-02-03 ENCOUNTER — Ambulatory Visit: Payer: Medicare Other | Attending: Family Medicine | Admitting: Physical Therapy

## 2021-02-03 DIAGNOSIS — N3941 Urge incontinence: Secondary | ICD-10-CM | POA: Diagnosis not present

## 2021-02-03 DIAGNOSIS — R2681 Unsteadiness on feet: Secondary | ICD-10-CM | POA: Diagnosis not present

## 2021-02-03 DIAGNOSIS — R2689 Other abnormalities of gait and mobility: Secondary | ICD-10-CM | POA: Insufficient documentation

## 2021-02-03 DIAGNOSIS — R29898 Other symptoms and signs involving the musculoskeletal system: Secondary | ICD-10-CM | POA: Diagnosis not present

## 2021-02-03 DIAGNOSIS — M79604 Pain in right leg: Secondary | ICD-10-CM | POA: Diagnosis not present

## 2021-02-03 NOTE — Therapy (Signed)
[facility] [address] [place], [place], [address] Phone: [phone]   Fax:  [phone]  Physical Therapy Treatment  Patient Details  Name: Kristin [name] MRN: [id number] Date of Birth: 11/26/1950 Referring Provider (PT): Gwendlyn Deutscher, [place] (([facility]); has also seen Dr. Vertell Limber)   Encounter Date: 02/03/2021   PT End of Session - 02/03/21 0806    Visit Number 6    Number of Visits 17    Date for PT Re-Evaluation 57/32/20   90 day cert for 8 wk POC   Authorization Type Medicare/[employer]    Progress Note Due on Visit 10    PT Start Time 0806    PT Stop Time 0848    PT Time Calculation (min) 42 min    Activity Tolerance Patient tolerated treatment well   no pain in hamstrings, decrease in ITB pain at end of session   Behavior During Therapy [facility] for tasks assessed/performed           Past Medical History:  Diagnosis Date  . Abnormal vaginal Pap smear   . Acute pharyngitis 02/25/2013  . Anemia   . Anxiety   . Arthritis   . 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 Chicken pox as a child  . Chronic UTI    sees dr Terance Hart  . Constipation 12/07/2015  . Depression with anxiety 11/02/2009   Qualifier: Diagnosis of  By: [name], LPN, [name]   . Dermatitis 07/17/2012  . Esophageal stricture 1994  . Fibroids   . Foot pain, bilateral 06/19/2012  . GERD (gastroesophageal reflux disease)   . GERD (gastroesophageal reflux disease)   . Hiatal hernia   . Hyperglycemia 08/19/2013  . 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 Hyperlipidemia   . Hypertension   . Infertility, female   . Low back pain 10/17/2007   Qualifier: Diagnosis of  By: [name], [name]   . Measles as a child  . Obesity   . Osteoarthritis   . Parkinson disease ([place])   . Plantar fasciitis of left foot 06/19/2012  . Preventative health care 12/19/2015  . Rheumatoid arthritis ([place]) 01/08/2018  . Rosacea 10/05/2014  . Swallowing difficulty    . Urinary frequency 02/25/2013  . Visual floaters 05/11/2014    Past Surgical History:  Procedure Laterality Date  . ABDOMINAL HYSTERECTOMY  2006   total  . esophageal     stretching  . laporoscopy    . LEFT HEART CATH AND CORONARY ANGIOGRAPHY N/A 06/02/2019   Procedure: LEFT HEART CATH AND CORONARY ANGIOGRAPHY;  Surgeon: Jettie Booze, MD;  Location: [facility];  Service: Cardiovascular;  Laterality: N/A;  . TONSILLECTOMY    . TOTAL HIP ARTHROPLASTY Right 2010  . wisdom teeth extracted      There were no vitals filed for this visit.   Subjective Assessment - 02/03/21 0806    Subjective Nothing new.  Helping the foot turn out has really helped the pain.    Pertinent History See problem list    Patient Stated Goals To get back in the pool; to get back strength in my legs.    Currently in Pain? Yes    Pain Score 1     Pain Location --   thigh   Pain Orientation Right;Posterior    Pain Descriptors / Indicators Aching;Tightness    Pain Onset More than a month ago    Pain Frequency Intermittent  Aggravating Factors  walking long distances, worse first thing in the morning, sitting too long    Pain Relieving Factors ice, rest, exercises                             [facility] Adult PT Treatment/Exercise - 02/03/21 0810      Ambulation/Gait   Ambulation/Gait Yes    Ambulation/Gait Assistance 5: Supervision    Ambulation/Gait Assistance Details Pt fatigues with gait after about 180-200 ft, with cues provided to stop, rock, reset.  Cues for widened BOS.    Ambulation Distance (Feet) 230 Feet   then 460   Assistive device None    Gait Pattern Step-through pattern;Decreased arm swing - right;Decreased step length - right;Decreased step length - left;Decreased stance time - right;Decreased weight shift to right;Antalgic;Narrow base of support    Ambulation Surface Level;Indoor    Gait Comments Discussed and provided walking program for home, to help with  increased gait endurance and tolerance.      Knee/Hip Exercises: Stretches   Active Hamstring Stretch Right;3 reps;30 seconds    Active Hamstring Stretch Limitations Seated hamstring stretch, foot propped on floor.  Also performed supine R SKTC 3 reps, then passive hamstring stretch 3 reps from 90/90 position, 3 reps 10 sec.    Piriformis Stretch Right;3 reps;10 seconds;Limitations      Knee/Hip Exercises: Standing   Hip Abduction Stengthening;Right;2 sets;5 reps;Knee straight    Abduction Limitations Then progressed to side step out and in, RLE x 10 reps.      Knee/Hip Exercises: Seated   Long Arc Quad Strengthening;Right;2 sets;10 reps    Heel Slides Strengthening;AROM;Right;2 sets;10 reps    Other Seated Knee/Hip Exercises RLE step out and in, 2 sets x 10 reps, cues to not come in past midline      Knee/Hip Exercises: Supine   Straight Leg Raises Strengthening;2 sets;10 reps;Right    Straight Leg Raises Limitations Cues for neutral foot position      Knee/Hip Exercises: Sidelying   Hip ABduction Strengthening;Right;2 sets;10 reps    Hip ABduction Limitations Cues for neutral foot position      Ankle Exercises: Seated   Other Seated Ankle Exercises --            Ankle eversion, towel slides, 2 sets x 3 reps, RLE, with cues for technique to focus on eversion/neutral foot position.         PT Education - 02/03/21 0947    Education Details Updates to HEP-seated exercises.  Discussed how various exercises can be "tools" in her "toolbox" to manage pain/discomfort thorughout the day.  Discussed and provided info on walking program for home.    Person(s) Educated Patient    Methods Explanation;Demonstration;Handout    Comprehension Verbalized understanding;Returned demonstration            PT Short Term Goals - 01/12/21 1335      PT SHORT TERM GOAL #1   Title Pt will be independent with HEP for improved posture, strength, decreased pain, and improved gait.  TARGET  02/11/2021    Time 4    Period Weeks    Status New      PT SHORT TERM GOAL #2   Title Pt will demonstrate improved flexibility in hamstrings by at least 5 degrees with no increase in pain,    Baseline -42 degrees from full extension/90/90 position    Time 4    Period Weeks  Status New      PT SHORT TERM GOAL #3   Title Pt will improve 5x sit<>stand score to less than or equal to 14 sec to improve functional strength and transfer efficiency.    Baseline 17.75 sec    Time 4    Period Weeks    Status New      PT SHORT TERM GOAL #4   Title Pt will report decrease in pain by 50% or more at worst, with gait activities, for improved functional mobility with decreased pain.    Baseline 7/10 at worst    Time 4    Period Weeks    Status New             PT Long Term Goals - 01/12/21 1514      PT LONG TERM GOAL #1   Title Pt will be independent with progression of HEP for improved functional mobility, strength, balance, decreased pain.  TARGET 03/11/2021    Time 8    Period Weeks    Status New      PT LONG TERM GOAL #2   Title Pt will improve 5x sit<>stand to less than or equal to 12.5 seconds for improved functional strength.    Time 8    Period Weeks    Status New      PT LONG TERM GOAL #3   Title Pt will improve TUG score to less than or equal to 13.5 sec for decreased fall risk.    Time 8    Period Weeks    Status New      PT LONG TERM GOAL #4   Title Pt will improve gait velocity to at least 2.2 ft/sec for improved gait efficiency and safety.    Time 8    Period Weeks    Status New      PT LONG TERM GOAL #5   Title Pt will improve hamstring flexibility by 10 degrees from baseline, for improved overall functional mobility.    Time 8    Period Weeks    Status New                 Plan - 02/03/21 0948    Clinical Impression Statement Skilled PT session focused on seated and standing exercises along with gait activities today.  Pt tolerates additional  strengthening/gentle stretching exercises without c/o pain.  Focus continues to be on gradual hip and hamstring/quad strengthening to help with improved gait pattern and balance.  Pt's RLE fatigues with exercises and with gait, but no pain increase noted in session today.  Pt continues to be receptive to HEP and education and overall feels better.  She will continue to benefit from skilled PT to address strength, pain, flexibility for improved gait and functional mobility.    Personal Factors and Comorbidities Comorbidity 3+    Comorbidities See problem list    Examination-Activity Limitations Locomotion Level;Transfers;Squat;Stand    Examination-Participation Restrictions Community Activity;Shop;Other   Community fitness   Stability/Clinical Decision Making Evolving/Moderate complexity    Rehab Potential Good    PT Frequency 2x / week    PT Duration 8 weeks   plus eval   PT Treatment/Interventions ADLs/Self Care Home Management;Aquatic Therapy;Moist Heat;Ultrasound;Cryotherapy;Scientist, product/process development;Neuromuscular re-education;Balance training;Therapeutic exercise;Therapeutic activities;Functional mobility training;Gait training;Patient/family education;Manual techniques;Passive range of motion    PT Next Visit Plan Review seated addition to HEP (I put in supine SLR and sidelying hip abduction, but did not give as pictures for HEP yet, as I  want to make sure she is not internally rotating at her hip when she performs these); manual/massage/ice massage to IT band and to piriformis/superior thigh; work on gait with/without walking pole for improved confidence with gait and mobility.  Next week:  check goals, discuss pool, adding appts    Consulted and Agree with Plan of Care Patient           Patient will benefit from skilled therapeutic intervention in order to improve the following deficits and impairments:  Abnormal gait,Difficulty walking,Decreased range of motion,Pain,Decreased  balance,Impaired flexibility,Decreased mobility,Decreased strength,Postural dysfunction  Visit Diagnosis: Other symptoms and signs involving the musculoskeletal system  Pain in right leg  Other abnormalities of gait and mobility     Problem List Patient Active Problem List   Diagnosis Date Noted  . Anemia 03/16/2020  . Coronary artery disease involving native coronary artery of native heart without angina pectoris 06/12/2019  . Abnormal stress test   . Diastolic dysfunction 19/37/9024  . Cough 05/10/2018  . Rheumatoid arthritis ([place]) 01/08/2018  . Hand pain, right 10/15/2017  . Vitamin D deficiency 12/20/2016  . Prediabetes 12/20/2016  . Shortness of breath on exertion 11/27/2016  . Preventative health care 12/19/2015  . Constipation 12/07/2015  . BCC (basal cell carcinoma of skin) 10/05/2014  . Rosacea 10/05/2014  . Parkinson's disease ([place]) 05/11/2014  . Visual floaters 05/11/2014  . Paralysis agitans ([place]) 01/08/2014  . Screening for cervical cancer 07/17/2012  . Foot pain, bilateral 06/19/2012  . 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 Obesity 11/12/2009  . Depression with anxiety 11/02/2009  . Urinary tract infection 12/03/2008  . 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 Lumbar back pain with radiculopathy affecting lower extremity 10/17/2007  . Essential hypertension 07/05/2007  . Osteoarthritis 07/05/2007  . Hyperlipidemia 06/26/2007  . H/O: iron deficiency anemia 05/08/2007  . Hiatal hernia with GERD 05/08/2007    Izen Petz W. 02/03/2021, 9:54 AM Frazier Butt., PT  [facility] [address] [place], [place], [address] Phone: [phone]   Fax:  [phone]  Name: Kristin [name] MRN: [id number] Date of Birth: April 10, 1951

## 2021-02-03 NOTE — Patient Instructions (Addendum)
WALKING  Walking is a great form of exercise to increase your strength, endurance and overall fitness.  A walking program can help you start slowly and gradually build endurance as you go.  Everyone's ability is different, so each person's starting point will be different.  You do not have to follow them exactly.  The are just samples. You should simply find out what's right for you and stick to that program.   In the beginning, you'll start off walking 2-3 times a day for short distances.  As you get stronger, you'll be walking further at just 1-2 times per day.  A. You Can Walk For A Certain Length Of Time Each Day    Walk 3 minutes 3 times per day.  Increase 1-2 minutes every 3-4 days (3 times per day).  Work up to 25-30 minutes (1-2 times per day).   Example:   Day 1-2 3 minutes 3 times per day   Day 7-8 5-7 minutes 2-3 times per day   Day 13-14 10 minutes 1-2 times per day   When you feel more tired through the Right leg, stop, rock, reset and start again.  Access Code: [email or web address] URL: [email or web address] Date: 02/03/2021 Prepared by: Mady Haagensen  Exercises Supine Hip Abduction - 1 x daily - 5 x weekly - 1-2 sets - 10 reps Quad Set - 1 x daily - 5 x weekly - 1-2 sets - 10 reps - 3 sec hold Supine Heel Slide - 1 x daily - 5 x weekly - 1-2 sets - 10 reps Clamshell - 1 x daily - 5 x weekly - 1-2 sets - 10 reps Seated Toe Raise - 1 x daily - 5 x weekly - 1-2 sets - 10 reps Seated Figure 4 Piriformis Stretch - 1-2 x daily - 7 x weekly - 1 sets - 3 reps - 10-15 sec hold  Updated pictures today as follows, but only provided seated pictures to patient (SLR and hip abduction to be added soon) Small Range Straight Leg Raise - 1 x daily - 7 x weekly - 3 sets - 10 reps Sidelying Hip Abduction - 1 x daily - 7 x weekly - 3 sets - 10 reps Seated Heel Slide - 1 x daily - 5 x weekly - 2 sets - 10 reps Seated Long Arc Quad - 1 x daily - 5 x weekly - 2 sets - 10 reps Seated  Hamstring Stretch - 1 x daily - 7 x weekly - 1 sets - 3 reps - 30 sec hold

## 2021-02-04 ENCOUNTER — Encounter: Payer: Self-pay | Admitting: Physical Therapy

## 2021-02-04 ENCOUNTER — Other Ambulatory Visit: Payer: Self-pay | Admitting: Neurology

## 2021-02-04 ENCOUNTER — Ambulatory Visit: Payer: Medicare Other | Admitting: Physical Therapy

## 2021-02-04 ENCOUNTER — Other Ambulatory Visit: Payer: Self-pay | Admitting: Cardiology

## 2021-02-04 DIAGNOSIS — R29898 Other symptoms and signs involving the musculoskeletal system: Secondary | ICD-10-CM | POA: Diagnosis not present

## 2021-02-04 DIAGNOSIS — M79604 Pain in right leg: Secondary | ICD-10-CM | POA: Diagnosis not present

## 2021-02-04 DIAGNOSIS — R2689 Other abnormalities of gait and mobility: Secondary | ICD-10-CM | POA: Diagnosis not present

## 2021-02-04 DIAGNOSIS — R2681 Unsteadiness on feet: Secondary | ICD-10-CM | POA: Diagnosis not present

## 2021-02-04 NOTE — Therapy (Signed)
[facility] [address] [place], [place], [address] Phone: [phone]   Fax:  [phone]  Physical Therapy Treatment  Patient Details  Name: Kristin [name] MRN: [id number] Date of Birth: 01-19-1951 Referring Provider (PT): Gwendlyn Deutscher, [place] (([facility]); has also seen Dr. Vertell Limber)   Encounter Date: 02/04/2021   PT End of Session - 02/04/21 1152    Visit Number 7    Number of Visits 17    Date for PT Re-Evaluation 43/15/40   90 day cert for 8 wk POC   Authorization Type Medicare/[employer]    Progress Note Due on Visit 10    PT Start Time 0934    PT Stop Time 1014    PT Time Calculation (min) 40 min    Activity Tolerance Patient tolerated treatment well   no pain in hamstrings, decrease in ITB pain at end of session   Behavior During Therapy [facility] for tasks assessed/performed           Past Medical History:  Diagnosis Date  . Abnormal vaginal Pap smear   . Acute pharyngitis 02/25/2013  . Anemia   . Anxiety   . Arthritis   . 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 Chicken pox as a child  . Chronic UTI    sees dr Terance Hart  . Constipation 12/07/2015  . Depression with anxiety 11/02/2009   Qualifier: Diagnosis of  By: [name], LPN, [name]   . Dermatitis 07/17/2012  . Esophageal stricture 1994  . Fibroids   . Foot pain, bilateral 06/19/2012  . GERD (gastroesophageal reflux disease)   . GERD (gastroesophageal reflux disease)   . Hiatal hernia   . Hyperglycemia 08/19/2013  . 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 Hyperlipidemia   . Hypertension   . Infertility, female   . Low back pain 10/17/2007   Qualifier: Diagnosis of  By: [name], [name]   . Measles as a child  . Obesity   . Osteoarthritis   . Parkinson disease ([place])   . Plantar fasciitis of left foot 06/19/2012  . Preventative health care 12/19/2015  . Rheumatoid arthritis ([place]) 01/08/2018  . Rosacea 10/05/2014  . Swallowing difficulty    . Urinary frequency 02/25/2013  . Visual floaters 05/11/2014    Past Surgical History:  Procedure Laterality Date  . ABDOMINAL HYSTERECTOMY  2006   total  . esophageal     stretching  . laporoscopy    . LEFT HEART CATH AND CORONARY ANGIOGRAPHY N/A 06/02/2019   Procedure: LEFT HEART CATH AND CORONARY ANGIOGRAPHY;  Surgeon: Jettie Booze, MD;  Location: [facility];  Service: Cardiovascular;  Laterality: N/A;  . TONSILLECTOMY    . TOTAL HIP ARTHROPLASTY Right 2010  . wisdom teeth extracted      There were no vitals filed for this visit.   Subjective Assessment - 02/04/21 0936    Subjective Nothing new, was tired yesterday walking into her doctor's office. Put some ice on her piriformis area really hips.    Pertinent History See problem list    Patient Stated Goals To get back in the pool; to get back strength in my legs.    Currently in Pain? No/denies    Pain Onset More than a month ago                             [facility] Adult PT  Treatment/Exercise - 02/04/21 0942      Ambulation/Gait   Ambulation/Gait Yes    Ambulation/Gait Assistance 5: Supervision    Ambulation/Gait Assistance Details cues for widened BOS. Pt able to maintain neutral foot position. performed at end of session    Ambulation Distance (Feet) 230 Feet    Assistive device None    Gait Pattern Step-through pattern;Decreased arm swing - right;Decreased step length - right;Decreased step length - left;Decreased stance time - right;Decreased weight shift to right;Antalgic;Narrow base of support    Ambulation Surface Level;Indoor      Exercises   Exercises Other Exercises    Other Exercises  Ankle eversion, towel slides, 2 sets x 5 reps, RLE, with cues for technique to focus on eversion/neutral foot position.      Knee/Hip Exercises: Stretches   Active Hamstring Stretch Right;30 seconds;2 reps    Active Hamstring Stretch Limitations reviewed from HEP, cues for posture      Knee/Hip  Exercises: Standing   Hip Abduction Stengthening;Right;2 sets;Knee straight;Left;Both;5 reps    Abduction Limitations standing with BUE support on chair. cues to lead with the heel for hip ABD activation, also performed standing on RLE for closed chain hip ABD. incr difficulty with RLE as stance leg      Knee/Hip Exercises: Seated   Long Arc Quad Strengthening;Right;2 sets;10 reps    Heel Slides Strengthening;AROM;Right;2 sets;10 reps    Heel Slides Limitations initial cues for proper technique      Knee/Hip Exercises: Supine   Straight Leg Raises Strengthening;2 sets;10 reps;Right    Straight Leg Raises Limitations Cues for neutral foot position - pt did well with using her vision to look at foot to make sure its in proper position.      Knee/Hip Exercises: Sidelying   Hip ABduction Strengthening;Right;2 sets;10 reps    Hip ABduction Limitations Cues for neutral foot position and leading movement with outside heel                  PT Education - 02/04/21 1152    Education Details reviewed HEP and provided print outs for SLR and sidelying hip ABD    Person(s) Educated Patient    Methods Explanation;Demonstration;Handout    Comprehension Verbalized understanding;Returned demonstration            PT Short Term Goals - 01/12/21 1335      PT SHORT TERM GOAL #1   Title Pt will be independent with HEP for improved posture, strength, decreased pain, and improved gait.  TARGET 02/11/2021    Time 4    Period Weeks    Status New      PT SHORT TERM GOAL #2   Title Pt will demonstrate improved flexibility in hamstrings by at least 5 degrees with no increase in pain,    Baseline -42 degrees from full extension/90/90 position    Time 4    Period Weeks    Status New      PT SHORT TERM GOAL #3   Title Pt will improve 5x sit<>stand score to less than or equal to 14 sec to improve functional strength and transfer efficiency.    Baseline 17.75 sec    Time 4    Period Weeks     Status New      PT SHORT TERM GOAL #4   Title Pt will report decrease in pain by 50% or more at worst, with gait activities, for improved functional mobility with decreased pain.    Baseline 7/10  at worst    Time 4    Period Weeks    Status New             PT Long Term Goals - 01/12/21 1514      PT LONG TERM GOAL #1   Title Pt will be independent with progression of HEP for improved functional mobility, strength, balance, decreased pain.  TARGET 03/11/2021    Time 8    Period Weeks    Status New      PT LONG TERM GOAL #2   Title Pt will improve 5x sit<>stand to less than or equal to 12.5 seconds for improved functional strength.    Time 8    Period Weeks    Status New      PT LONG TERM GOAL #3   Title Pt will improve TUG score to less than or equal to 13.5 sec for decreased fall risk.    Time 8    Period Weeks    Status New      PT LONG TERM GOAL #4   Title Pt will improve gait velocity to at least 2.2 ft/sec for improved gait efficiency and safety.    Time 8    Period Weeks    Status New      PT LONG TERM GOAL #5   Title Pt will improve hamstring flexibility by 10 degrees from baseline, for improved overall functional mobility.    Time 8    Period Weeks    Status New                 Plan - 02/04/21 1153    Clinical Impression Statement Continued to focus on standing and seated exercises for strengthening R hip and hamstrings quads. Added supine SLR and sidelying hip ABD for RLE to pt's HEP today, pt able to perform with proper foot positioning. No reports of pain throughout session, just fatigue in RLE when performing gait at end of session. Will continue to progress towards LTGs.    Personal Factors and Comorbidities Comorbidity 3+    Comorbidities See problem list    Examination-Activity Limitations Locomotion Level;Transfers;Squat;Stand    Examination-Participation Restrictions Community Activity;Shop;Other   Community fitness   Stability/Clinical  Decision Making Evolving/Moderate complexity    Rehab Potential Good    PT Frequency 2x / week    PT Duration 8 weeks   plus eval   PT Treatment/Interventions ADLs/Self Care Home Management;Aquatic Therapy;Moist Heat;Ultrasound;Cryotherapy;Scientist, product/process development;Neuromuscular re-education;Balance training;Therapeutic exercise;Therapeutic activities;Functional mobility training;Gait training;Patient/family education;Manual techniques;Passive range of motion    PT Next Visit Plan how are new additions to HEP?; manual/massage/ice massage to IT band and to piriformis/superior thigh; work on gait with/without walking pole for improved confidence with gait and mobility.  this week:  check goals, discuss pool, adding appts    Consulted and Agree with Plan of Care Patient           Patient will benefit from skilled therapeutic intervention in order to improve the following deficits and impairments:  Abnormal gait,Difficulty walking,Decreased range of motion,Pain,Decreased balance,Impaired flexibility,Decreased mobility,Decreased strength,Postural dysfunction  Visit Diagnosis: Other symptoms and signs involving the musculoskeletal system  Pain in right leg  Other abnormalities of gait and mobility     Problem List Patient Active Problem List   Diagnosis Date Noted  . Anemia 03/16/2020  . Coronary artery disease involving native coronary artery of native heart without angina pectoris 06/12/2019  . Abnormal stress test   . Diastolic dysfunction 09/32/3557  .  Cough 05/10/2018  . Rheumatoid arthritis ([place]) 01/08/2018  . Hand pain, right 10/15/2017  . Vitamin D deficiency 12/20/2016  . Prediabetes 12/20/2016  . Shortness of breath on exertion 11/27/2016  . Preventative health care 12/19/2015  . Constipation 12/07/2015  . BCC (basal cell carcinoma of skin) 10/05/2014  . Rosacea 10/05/2014  . Parkinson's disease ([place]) 05/11/2014  . Visual floaters 05/11/2014  . Paralysis  agitans ([place]) 01/08/2014  . Screening for cervical cancer 07/17/2012  . Foot pain, bilateral 06/19/2012  . 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 Obesity 11/12/2009  . Depression with anxiety 11/02/2009  . Urinary tract infection 12/03/2008  . 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 Lumbar back pain with radiculopathy affecting lower extremity 10/17/2007  . Essential hypertension 07/05/2007  . Osteoarthritis 07/05/2007  . Hyperlipidemia 06/26/2007  . H/O: iron deficiency anemia 05/08/2007  . Hiatal hernia with GERD 05/08/2007    Arliss Journey, PT, DPT  02/04/2021, 11:55 AM  [facility] [address] [place], [place], [address] Phone: [phone]   Fax:  [phone]  Name: Kristin [name] MRN: [id number] Date of Birth: March 29, 1951

## 2021-02-07 ENCOUNTER — Other Ambulatory Visit: Payer: Self-pay

## 2021-02-07 ENCOUNTER — Ambulatory Visit: Payer: Medicare Other | Admitting: Physical Therapy

## 2021-02-07 ENCOUNTER — Encounter: Payer: Self-pay | Admitting: Physical Therapy

## 2021-02-07 DIAGNOSIS — R2689 Other abnormalities of gait and mobility: Secondary | ICD-10-CM | POA: Diagnosis not present

## 2021-02-07 DIAGNOSIS — R29898 Other symptoms and signs involving the musculoskeletal system: Secondary | ICD-10-CM | POA: Diagnosis not present

## 2021-02-07 DIAGNOSIS — M79604 Pain in right leg: Secondary | ICD-10-CM | POA: Diagnosis not present

## 2021-02-07 DIAGNOSIS — R2681 Unsteadiness on feet: Secondary | ICD-10-CM | POA: Diagnosis not present

## 2021-02-07 NOTE — Therapy (Signed)
[facility] [address] [place], [place], [address] Phone: [phone]   Fax:  [phone]  Physical Therapy Treatment  Patient Details  Name: Kristin [name] MRN: [id number] Date of Birth: [birth year] Referring Provider (PT): Gwendlyn Deutscher, [place] (([facility]); has also seen Dr. Vertell Limber)   Encounter Date: 02/07/2021   PT End of Session - 02/07/21 0808    Visit Number 8    Number of Visits 17    Date for PT Re-Evaluation 74/14/23   90 day cert for 8 wk POC   Authorization Type Medicare/[employer]    Progress Note Due on Visit 10    PT Start Time 0805    PT Stop Time 0846    PT Time Calculation (min) 41 min    Activity Tolerance Patient tolerated treatment well   no pain in hamstrings, decrease in ITB pain at end of session   Behavior During Therapy [facility] for tasks assessed/performed           Past Medical History:  Diagnosis Date  . Abnormal vaginal Pap smear   . Acute pharyngitis 02/25/2013  . Anemia   . Anxiety   . Arthritis   . 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 Chicken pox as a child  . Chronic UTI    sees dr Terance Hart  . Constipation 12/07/2015  . Depression with anxiety 11/02/2009   Qualifier: Diagnosis of  By: [name], LPN, [name]   . Dermatitis 07/17/2012  . Esophageal stricture 1994  . Fibroids   . Foot pain, bilateral 06/19/2012  . GERD (gastroesophageal reflux disease)   . GERD (gastroesophageal reflux disease)   . Hiatal hernia   . Hyperglycemia 08/19/2013  . 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 Hyperlipidemia   . Hypertension   . Infertility, female   . Low back pain 10/17/2007   Qualifier: Diagnosis of  By: [name], [name]   . Measles as a child  . Obesity   . Osteoarthritis   . Parkinson disease ([place])   . Plantar fasciitis of left foot 06/19/2012  . Preventative health care 12/19/2015  . Rheumatoid arthritis ([place]) 01/08/2018  . Rosacea 10/05/2014  . Swallowing difficulty    . Urinary frequency 02/25/2013  . Visual floaters 05/11/2014    Past Surgical History:  Procedure Laterality Date  . ABDOMINAL HYSTERECTOMY  2006   total  . esophageal     stretching  . laporoscopy    . LEFT HEART CATH AND CORONARY ANGIOGRAPHY N/A 06/02/2019   Procedure: LEFT HEART CATH AND CORONARY ANGIOGRAPHY;  Surgeon: Jettie Booze, MD;  Location: [facility];  Service: Cardiovascular;  Laterality: N/A;  . TONSILLECTOMY    . TOTAL HIP ARTHROPLASTY Right 2010  . wisdom teeth extracted      There were no vitals filed for this visit.   Subjective Assessment - 02/07/21 0808    Subjective Legs hurt a little, but with stretching and ice, it feels better.  I still slow down when I walk.  Been doing 3 minutes of walking, 3x/day. Pain at worst these days is 3/10.    Pertinent History See problem list    Patient Stated Goals To get back in the pool; to get back strength in my legs.    Currently in Pain? No/denies    Pain Score 0-No pain    Pain Onset More than a month ago  [facility] PT Assessment - 02/07/21 0001      PROM   Overall PROM Comments Passive ROM to R hamstrings:  35 degrees from full extension; with SLR test, pain in hamstring at 56 degrees with Passive motion             Access Code: [email or web address] URL: [email or web address] Date: 02/03/2021 Prepared by: Mady Haagensen   Exercises-Reviewed full HEP, with pt needing minimal cues for technique; she is independent with HEP.  Educated pt that she can perform exercises in varied positions throughout the day based on what position she is in, to manage her tightness and stiffness. *Provided pt with email handout of updated HEP with exercise tracking chart included (printer not working this morning to print out).  Supine Hip Abduction - 1 x daily - 5 x weekly - 1-2 sets - 10 reps (performed, but took out of HEP, due to pt has progressed) Quad Set - 1 x daily - 5 x weekly - 1-2 sets - 10  reps - 3 sec hold Supine Heel Slide - 1 x daily - 5 x weekly - 1-2 sets - 10 reps Clamshell - 1 x daily - 5 x weekly - 1-2 sets - 10 reps Seated Toe Raise - 1 x daily - 5 x weekly - 1-2 sets - 10 reps -also performed seated ankle eversion using pillow case, 3 reps Seated Figure 4 Piriformis Stretch - 1-2 x daily - 7 x weekly - 1 sets - 3 reps - 10-15 sec hold   Small Range Straight Leg Raise - 1 x daily - 7 x weekly - 3 sets - 10 reps -pt demo good job of visually looking at R foot for neutral position Sidelying Hip Abduction - 1 x daily - 7 x weekly - 3 sets - 10 reps -demo good job of visually looking at R foot for neutral position Seated Heel Slide - 1 x daily - 5 x weekly - 2 sets - 10 reps (cues for technique) Seated Long Arc Quad - 1 x daily - 5 x weekly - 2 sets - 10 reps Seated Hamstring Stretch - 1 x daily - 7 x weekly - 1 sets - 3 reps - 30 sec hold               [facility] Adult PT Treatment/Exercise - 02/07/21 0001      Ambulation/Gait   Ambulation/Gait Yes    Ambulation/Gait Assistance 5: Supervision    Ambulation/Gait Assistance Details Used bilat walkin poles to facilitate arm swing with gait to help with improved step length and overall gait confidence.  Pt pleased to see/feel how arm swing helps her feel more balanced.    Ambulation Distance (Feet) 460 Feet   230 with bilat poles for arm swing, then 80 ft gait no poles, cues to maintain arm swing and step length.   Assistive device None    Gait Pattern Step-through pattern;Decreased arm swing - right;Decreased step length - right;Decreased step length - left;Decreased stance time - right;Decreased weight shift to right;Narrow base of support    Ambulation Surface Level;Indoor                  PT Education - 02/07/21 [phone]    Education Details Reviewed HEP and provided printouts for chart/HEP tracking.  Discussed progress towards goals, POC and scheduling pool appts    Person(s) Educated Patient    Methods  Explanation;Other (comment)   emailed   Comprehension Verbalized understanding;Returned demonstration  PT Short Term Goals - 02/07/21 3893      PT SHORT TERM GOAL #1   Title Pt will be independent with HEP for improved posture, strength, decreased pain, and improved gait.  TARGET 02/11/2021    Time 4    Period Weeks    Status Achieved      PT SHORT TERM GOAL #2   Title Pt will demonstrate improved flexibility in hamstrings by at least 5 degrees with no increase in pain,    Baseline -42 degrees from full extension/90/90 position; -35 degrees from full extension in 90/90 position supine, 02/07/21    Time 4    Period Weeks    Status Achieved      PT SHORT TERM GOAL #3   Title Pt will improve 5x sit<>stand score to less than or equal to 14 sec to improve functional strength and transfer efficiency.    Baseline 17.75 sec    Time 4    Period Weeks    Status New      PT SHORT TERM GOAL #4   Title Pt will report decrease in pain by 50% or more at worst, with gait activities, for improved functional mobility with decreased pain.    Baseline 7/10 at worst; 3/10 at worst 02/07/2021    Time 4    Period Weeks    Status Achieved             PT Long Term Goals - 01/12/21 1514      PT LONG TERM GOAL #1   Title Pt will be independent with progression of HEP for improved functional mobility, strength, balance, decreased pain.  TARGET 03/11/2021    Time 8    Period Weeks    Status New      PT LONG TERM GOAL #2   Title Pt will improve 5x sit<>stand to less than or equal to 12.5 seconds for improved functional strength.    Time 8    Period Weeks    Status New      PT LONG TERM GOAL #3   Title Pt will improve TUG score to less than or equal to 13.5 sec for decreased fall risk.    Time 8    Period Weeks    Status New      PT LONG TERM GOAL #4   Title Pt will improve gait velocity to at least 2.2 ft/sec for improved gait efficiency and safety.    Time 8    Period Weeks     Status New      PT LONG TERM GOAL #5   Title Pt will improve hamstring flexibility by 10 degrees from baseline, for improved overall functional mobility.    Time 8    Period Weeks    Status New                 Plan - 02/07/21 1516    Clinical Impression Statement Pt has met STG 1, 2, and 4, which were assessed today.  Pt is demo overall less c/o pain in R hip/hamstring area and she is demo improved flexibility of R hamstring.  Pt is performing HEP consistently and she continues to focus on gait pattern to imrpove wider BOS and more stability in standing.  Discussed and feel that pt's progress is such that she can begin to transition next week to 1x/wk in clinic and 1x/wk in pool for aquatic therapy to address strength, gait and balance towards LTGs.    Personal  Factors and Comorbidities Comorbidity 3+    Comorbidities See problem list    Examination-Activity Limitations Locomotion Level;Transfers;Squat;Stand    Examination-Participation Restrictions Community Activity;Shop;Other   Community fitness   Stability/Clinical Decision Making Evolving/Moderate complexity    Rehab Potential Good    PT Frequency 2x / week    PT Duration 8 weeks   plus eval   PT Treatment/Interventions ADLs/Self Care Home Management;Aquatic Therapy;Moist Heat;Ultrasound;Cryotherapy;Scientist, product/process development;Neuromuscular re-education;Balance training;Therapeutic exercise;Therapeutic activities;Functional mobility training;Gait training;Patient/family education;Manual techniques;Passive range of motion    PT Next Visit Plan manual/massage/ice massage to IT band and to piriformis/superior thigh; work on gait with/without walking pole (use of walking poles for faciltiation of arm swing) for improved confidence with gait and mobility.  Check remaining STG    Consulted and Agree with Plan of Care Patient           Patient will benefit from skilled therapeutic intervention in order to improve the  following deficits and impairments:  Abnormal gait,Difficulty walking,Decreased range of motion,Pain,Decreased balance,Impaired flexibility,Decreased mobility,Decreased strength,Postural dysfunction  Visit Diagnosis: Other symptoms and signs involving the musculoskeletal system  Other abnormalities of gait and mobility     Problem List Patient Active Problem List   Diagnosis Date Noted  . Anemia 03/16/2020  . Coronary artery disease involving native coronary artery of native heart without angina pectoris 06/12/2019  . Abnormal stress test   . Diastolic dysfunction 97/35/3299  . Cough 05/10/2018  . Rheumatoid arthritis ([place]) 01/08/2018  . Hand pain, right 10/15/2017  . Vitamin D deficiency 12/20/2016  . Prediabetes 12/20/2016  . Shortness of breath on exertion 11/27/2016  . Preventative health care 12/19/2015  . Constipation 12/07/2015  . BCC (basal cell carcinoma of skin) 10/05/2014  . Rosacea 10/05/2014  . Parkinson's disease ([place]) 05/11/2014  . Visual floaters 05/11/2014  . Paralysis agitans ([place]) 01/08/2014  . Screening for cervical cancer 07/17/2012  . Foot pain, bilateral 06/19/2012  . 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 Obesity 11/12/2009  . Depression with anxiety 11/02/2009  . Urinary tract infection 12/03/2008  . 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 Lumbar back pain with radiculopathy affecting lower extremity 10/17/2007  . Essential hypertension 07/05/2007  . Osteoarthritis 07/05/2007  . Hyperlipidemia 06/26/2007  . H/O: iron deficiency anemia 05/08/2007  . Hiatal hernia with GERD 05/08/2007    Cillian Gwinner W. 02/07/2021, 3:20 PM Frazier Butt., PT  [facility] [address] [place], [place], [address] Phone: [phone]   Fax:  [phone]  Name: Kristin [name] MRN: [id number] Date of Birth: 22-Jan-1951

## 2021-02-07 NOTE — Patient Instructions (Addendum)
Provided email of [facility] exercises updates, including exercise tracking charts (printer not working today)

## 2021-02-11 ENCOUNTER — Ambulatory Visit: Payer: Medicare Other | Admitting: Physical Therapy

## 2021-02-14 ENCOUNTER — Ambulatory Visit: Payer: Medicare Other | Admitting: Physical Therapy

## 2021-02-18 ENCOUNTER — Other Ambulatory Visit: Payer: Self-pay

## 2021-02-18 ENCOUNTER — Ambulatory Visit: Payer: Medicare Other | Admitting: Physical Therapy

## 2021-02-18 DIAGNOSIS — R2689 Other abnormalities of gait and mobility: Secondary | ICD-10-CM

## 2021-02-18 DIAGNOSIS — R2681 Unsteadiness on feet: Secondary | ICD-10-CM | POA: Diagnosis not present

## 2021-02-18 DIAGNOSIS — M79604 Pain in right leg: Secondary | ICD-10-CM | POA: Diagnosis not present

## 2021-02-18 DIAGNOSIS — R29898 Other symptoms and signs involving the musculoskeletal system: Secondary | ICD-10-CM | POA: Diagnosis not present

## 2021-02-18 NOTE — Therapy (Signed)
[facility] [address] [place], [place], [address] Phone: [phone]   Fax:  [phone]  Physical Therapy Treatment  Patient Details  Name: Kristin [name] MRN: [id number] Date of Birth: 1951-03-26 Referring Provider (PT): Gwendlyn Deutscher, [place] (([facility]); has also seen Dr. Vertell Limber)   Encounter Date: 02/18/2021   PT End of Session - 02/18/21 0754     Visit Number 9    Number of Visits 17    Date for PT Re-Evaluation 02/77/41   90 day cert for 8 wk POC   Authorization Type Medicare/[employer]    Progress Note Due on Visit 10    PT Start Time 0757    PT Stop Time 0844    PT Time Calculation (min) 47 min    Activity Tolerance Patient tolerated treatment well   no pain in hamstrings, decrease in ITB pain at end of session   Behavior During Therapy [facility] for tasks assessed/performed             Past Medical History:  Diagnosis Date   Abnormal vaginal Pap smear    Acute pharyngitis 02/25/2013   Anemia    Anxiety    Arthritis    BCC (basal cell carcinoma of skin) 10/05/2014   On back   Chicken pox as a child   Chronic UTI    sees dr Terance Hart   Constipation 12/07/2015   Depression with anxiety 11/02/2009   Qualifier: Diagnosis of  By: [name], LPN, Stevie Kern M    Dermatitis 07/17/2012   Esophageal stricture 1994   Fibroids    Foot pain, bilateral 06/19/2012   GERD (gastroesophageal reflux disease)    GERD (gastroesophageal reflux disease)    Hiatal hernia    Hyperglycemia 08/19/2013   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   Hyperlipidemia    Hypertension    Infertility, female    Low back pain 10/17/2007   Qualifier: Diagnosis of  By: [name], [name]    Measles as a child   Obesity    Osteoarthritis    Parkinson disease ([place])    Plantar fasciitis of left foot 06/19/2012   Preventative health care 12/19/2015   Rheumatoid arthritis ([place]) 01/08/2018   Rosacea 10/05/2014   Swallowing difficulty    Urinary frequency  02/25/2013   Visual floaters 05/11/2014    Past Surgical History:  Procedure Laterality Date   ABDOMINAL HYSTERECTOMY  2006   total   esophageal     stretching   laporoscopy     LEFT HEART CATH AND CORONARY ANGIOGRAPHY N/A 06/02/2019   Procedure: LEFT HEART CATH AND CORONARY ANGIOGRAPHY;  Surgeon: Jettie Booze, MD;  Location: [facility];  Service: Cardiovascular;  Laterality: N/A;   TONSILLECTOMY     TOTAL HIP ARTHROPLASTY Right 2010   wisdom teeth extracted      There were no vitals filed for this visit.   Subjective Assessment - 02/18/21 0754     Subjective Not having any pain; not sure about using the walking pole for long distances.  Over the past week, the only time I have pain is when I first wake up, if I don't put shoes on.  Now, its more a feeling of balance and uncertainty with the R leg.    Pertinent History See problem list    Patient Stated Goals To get back in the pool; to get back strength in my legs.    Currently in Pain? No/denies    Pain Onset  More than a month ago                               [facility] Adult PT Treatment/Exercise - 02/18/21 0801       Transfers   Transfers Sit to Stand;Stand to Sit    Sit to Stand Without upper extremity assist;From chair/3-in-1;6: Modified independent (Device/Increase time)    Five time sit to stand comments  12.12    Stand to Sit 6: Modified independent (Device/Increase time);Without upper extremity assist;To chair/3-in-1      Ambulation/Gait   Ambulation/Gait Yes    Ambulation/Gait Assistance 5: Supervision;6: Modified independent (Device/Increase time)    Ambulation Distance (Feet) 270 Feet    Assistive device None    Gait Pattern Step-through pattern;Decreased arm swing - right;Decreased step length - right;Decreased step length - left;Decreased stance time - right;Decreased weight shift to right;Narrow base of support    Ambulation Surface Level;Indoor    Curb 5: Supervision;Other  (comment)   Min guard   Curb Details (indicate cue type and reason) Performed x 5 reps, pt ascends with LLE and descends with RLE; cues for PWR! Up upon stepping up on curb, pt improves with repetition.    Pre-Gait Activities Forward/back gait in parallel bars, 5 reps, cues for widened BOS    Gait Comments Gait including figure-8 turns and changes of directions,initiating turn with PWR! step to open up.  Focus on wider BOS and staying tall through R hip with gait.      High Level Balance   High Level Balance Comments Alt step taps to 4" step, 2 sets x 10 reps, minimal UE support.  Cues to land with wider BOS.      Knee/Hip Exercises: Standing   Hip Flexion Stengthening;Right;Left;2 sets;10 reps;Knee bent    Hip Abduction Stengthening;Both;2 sets;10 reps;Knee straight    Abduction Limitations standing at parallel bars    Hip Extension Stengthening;Right;Left;2 sets;10 reps;Knee straight    Extension Limitations Cues for glut activation on stance leg.    Lateral Step Up Right;Left;1 set;10 reps;Step Height: 4"    Forward Step Up Right;Left;Hand Hold: 2;Step Height: 4";10 reps;2 sets    Other Standing Knee Exercises Alt heel raises, 10 reps                 Balance Exercises - 02/18/21 0001       Balance Exercises: Standing   Balance Beam marching in place x 10 reps, alt hip kicks forward x 10, alt forward step taps x 10 reps, alt back step taps x 10, side step taps x 10 reps.    Sidestepping Foam/compliant support;Upper extremity support;3 reps               PT Education - 02/18/21 0846     Education Details Curb negotiation, gait with wider BOS and PWR! Up posture with SLS activities    Person(s) Educated Patient    Methods Explanation;Demonstration;Verbal cues    Comprehension Verbalized understanding;Returned demonstration;Verbal cues required              PT Short Term Goals - 02/18/21 0825       PT SHORT TERM GOAL #1   Title Pt will be independent with HEP  for improved posture, strength, decreased pain, and improved gait.  TARGET 02/11/2021    Time 4    Period Weeks    Status Achieved      PT SHORT TERM GOAL #  2   Title Pt will demonstrate improved flexibility in hamstrings by at least 5 degrees with no increase in pain,    Baseline -42 degrees from full extension/90/90 position; -35 degrees from full extension in 90/90 position supine, 02/07/21    Time 4    Period Weeks    Status Achieved      PT SHORT TERM GOAL #3   Title Pt will improve 5x sit<>stand score to less than or equal to 14 sec to improve functional strength and transfer efficiency.    Baseline 17.75 sec; 12.12 sec    Time 4    Period Weeks    Status Achieved      PT SHORT TERM GOAL #4   Title Pt will report decrease in pain by 50% or more at worst, with gait activities, for improved functional mobility with decreased pain.    Baseline 7/10 at worst; 3/10 at worst 02/07/2021    Time 4    Period Weeks    Status Achieved               PT Long Term Goals - 02/18/21 0847       PT LONG TERM GOAL #1   Title Pt will be independent with progression of HEP for improved functional mobility, strength, balance, decreased pain.  TARGET 03/11/2021    Time 8    Period Weeks    Status New      PT LONG TERM GOAL #2   Title Pt will improve 5x sit<>stand to less than or equal to 12.5 seconds for improved functional strength.    Baseline 12.12 sec 6/17    Time 8    Period Weeks    Status Achieved      PT LONG TERM GOAL #3   Title Pt will improve TUG score to less than or equal to 13.5 sec for decreased fall risk.    Time 8    Period Weeks    Status New      PT LONG TERM GOAL #4   Title Pt will improve gait velocity to at least 2.2 ft/sec for improved gait efficiency and safety.    Time 8    Period Weeks    Status New      PT LONG TERM GOAL #5   Title Pt will improve hamstring flexibility by 10 degrees from baseline, for improved overall functional mobility.    Time 8     Period Weeks    Status New                   Plan - 02/18/21 0825     Clinical Impression Statement Assessed remaining STG, with pt meeting STG 3 for improved 5x sit<>stand.  Pt with no pain today, so skilled PT session focused on strength and balance, gait training for improved confidence with gait.  Pt able to tolerate full session of standing exercises with minimal fatigue and no pain.  Pt will continue to benefit from skilled PT to fruther address strength, balance, gait towards LTGs.    Personal Factors and Comorbidities Comorbidity 3+    Comorbidities See problem list    Examination-Activity Limitations Locomotion Level;Transfers;Squat;Stand    Examination-Participation Restrictions Community Activity;Shop;Other   Community fitness   Stability/Clinical Decision Making Evolving/Moderate complexity    Rehab Potential Good    PT Frequency 2x / week    PT Duration 8 weeks   plus eval   PT Treatment/Interventions ADLs/Self Care Home Management;Aquatic Therapy;Moist Heat;Ultrasound;Cryotherapy;Dealer  Stimulation;DME Instruction;Neuromuscular re-education;Balance training;Therapeutic exercise;Therapeutic activities;Functional mobility training;Gait training;Patient/family education;Manual techniques;Passive range of motion    PT Next Visit Plan 10th visit progress note. work on gait with/without walking pole (use of walking poles for faciltiation of arm swing) for improved confidence with gait and mobility.  Gait, turns, compliant surfaces, standing stregntheing, use of standing PWR! Moves, consider use of SciFit seated stepper    Consulted and Agree with Plan of Care Patient           Patient will benefit from skilled therapeutic intervention in order to improve the following deficits and impairments:  Abnormal gait, Difficulty walking, Decreased range of motion, Pain, Decreased balance, Impaired flexibility, Decreased mobility, Decreased strength, Postural dysfunction  Visit  Diagnosis: Other abnormalities of gait and mobility  Unsteadiness on feet     Problem List Patient Active Problem List   Diagnosis Date Noted   Anemia 03/16/2020   Coronary artery disease involving native coronary artery of native heart without angina pectoris 06/12/2019   Abnormal stress test    Diastolic dysfunction 36/14/4315   Cough 05/10/2018   Rheumatoid arthritis ([place]) 01/08/2018   Hand pain, right 10/15/2017   Vitamin D deficiency 12/20/2016   Prediabetes 12/20/2016   Shortness of breath on exertion 11/27/2016   Preventative health care 12/19/2015   Constipation 12/07/2015   BCC (basal cell carcinoma of skin) 10/05/2014   Rosacea 10/05/2014   Parkinson's disease ([place]) 05/11/2014   Visual floaters 05/11/2014   Paralysis agitans ([place]) 01/08/2014   Screening for cervical cancer 07/17/2012   Foot pain, bilateral 06/19/2012   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 02/18/2021, 8:50 AM Frazier Butt., PT   [facility] [address] [place], [place], [address] Phone: [phone]   Fax:  [phone]  Name: Kristin [name] MRN: [id number] Date of Birth: Feb 25, 1951

## 2021-02-21 ENCOUNTER — Other Ambulatory Visit: Payer: Self-pay

## 2021-02-21 ENCOUNTER — Ambulatory Visit: Payer: Medicare Other | Attending: Family Medicine | Admitting: Physical Therapy

## 2021-02-21 DIAGNOSIS — R2689 Other abnormalities of gait and mobility: Secondary | ICD-10-CM | POA: Diagnosis not present

## 2021-02-21 DIAGNOSIS — M79604 Pain in right leg: Secondary | ICD-10-CM | POA: Diagnosis not present

## 2021-02-21 DIAGNOSIS — R2681 Unsteadiness on feet: Secondary | ICD-10-CM | POA: Diagnosis not present

## 2021-02-21 DIAGNOSIS — R29898 Other symptoms and signs involving the musculoskeletal system: Secondary | ICD-10-CM | POA: Diagnosis not present

## 2021-02-22 ENCOUNTER — Encounter: Payer: Self-pay | Admitting: Physical Therapy

## 2021-02-22 NOTE — Therapy (Addendum)
[facility] [address] [place], [place], [address] Phone: [phone]   Fax:  [phone]  Physical Therapy Treatment/10th Visit Progress Note  Patient Details  Name: Kristin [name] MRN: [id number] Date of Birth: Sep 28, 1950 Referring Provider (PT): Gwendlyn Deutscher, [place] (([facility]); has also seen Dr. Vertell Limber)   10th Visit Progress Note, covering dates 01/11/2021-02/21/2021.   Subjective reports:  Pt reports no pain in R posterior thigh.  She reports mostly feeling like R leg is weaker and she's not as confident with balance. Objective Measures:  5x sit<>stand:  12.12 sec, R hamstring range of motion -35 degrees from full extension (improved from -42).  Pt has reported no pain for several visits, compared to significant pain/discomfort R thigh at eval. Plan:  Pt is making nice progress with decreased overall hip and thigh pain, but she is demonstrating decreased functional strength and decreased balance through RLE.  She has met all STGs, and she will benefit from skilled PT to further address stregnth, flexibility, balance, and gait towards LTGs for overall improved functional mobility and decreased fall risk.  Mady Haagensen, PT 02/25/21 2:29 PM Phone: [phone] Fax: [phone]   Encounter Date: 02/21/2021   PT End of Session - 02/22/21 1519     Visit Number 10    Number of Visits 17    Date for PT Re-Evaluation 53/66/44   90 day cert for 8 wk POC   Authorization Type Medicare/[employer]    Progress Note Due on Visit 10    PT Start Time 1230    PT Stop Time 1315    PT Time Calculation (min) 45 min    Equipment Utilized During Treatment Other (comment)   pool noodle, ankle buoyancy cuff   Activity Tolerance Patient tolerated treatment well   no pain in hamstrings, decrease in ITB pain at end of session   Behavior During Therapy [facility] for tasks assessed/performed             Past Medical History:  Diagnosis Date    Abnormal vaginal Pap smear    Acute pharyngitis 02/25/2013   Anemia    Anxiety    Arthritis    BCC (basal cell carcinoma of skin) 10/05/2014   On back   Chicken pox as a child   Chronic UTI    sees dr Terance Hart   Constipation 12/07/2015   Depression with anxiety 11/02/2009   Qualifier: Diagnosis of  By: [name], LPN, Stevie Kern M    Dermatitis 07/17/2012   Esophageal stricture 1994   Fibroids    Foot pain, bilateral 06/19/2012   GERD (gastroesophageal reflux disease)    GERD (gastroesophageal reflux disease)    Hiatal hernia    Hyperglycemia 08/19/2013   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   Hyperlipidemia    Hypertension    Infertility, female    Low back pain 10/17/2007   Qualifier: Diagnosis of  By: [name], [name]    Measles as a child   Obesity    Osteoarthritis    Parkinson disease ([place])    Plantar fasciitis of left foot 06/19/2012   Preventative health care 12/19/2015   Rheumatoid arthritis ([place]) 01/08/2018   Rosacea 10/05/2014   Swallowing difficulty    Urinary frequency 02/25/2013   Visual floaters 05/11/2014    Past Surgical History:  Procedure Laterality Date   ABDOMINAL HYSTERECTOMY  2006   total   esophageal     stretching   laporoscopy     LEFT HEART CATH  AND CORONARY ANGIOGRAPHY N/A 06/02/2019   Procedure: LEFT HEART CATH AND CORONARY ANGIOGRAPHY;  Surgeon: Jettie Booze, MD;  Location: [facility];  Service: Cardiovascular;  Laterality: N/A;   TONSILLECTOMY     TOTAL HIP ARTHROPLASTY Right 2010   wisdom teeth extracted      There were no vitals filed for this visit.   Subjective Assessment - 02/22/21 1518     Subjective Pt has been working on proper placement of R foot with gait since last land session.    Pertinent History See problem list    Patient Stated Goals To get back in the pool; to get back strength in my legs.    Currently in Pain? No/denies    Pain Onset More than a month ago            Aquatic therapy at [facility]  Patient  seen for aquatic therapy today.  Treatment took place in water 3.6-4.2 feet deep depending upon activity.  Pt entered and exited the   the pool steps with bil rails and close supervision.   Runners stretch at pool edge bil LE x 30 sec     Pt gait trained in 3.6-4.2 ft and pool noodle with PTA initially supporting noodle progressing to supervision without noodle.  Worked on forward, backward and side stepping gait.    Pt standing at pool wall and performed bil hip flexion, hip extension, hip abd/add, hamstring curl all x 10 reps each with bil UE support working on progressing to 1-2 finger support.  Added ankle buoyancy cuffs and repeated same with bil UE support.  Performed forward step weight shifting with 1 UE support.   Buoyancy needed for support and for safety for reduced fall risk with activities in standing and with gait training.  Viscosity of water needed for resistance for strengthening for improved core stabilization and for LE strengthening.               PT Short Term Goals - 02/18/21 0825       PT SHORT TERM GOAL #1   Title Pt will be independent with HEP for improved posture, strength, decreased pain, and improved gait.  TARGET 02/11/2021    Time 4    Period Weeks    Status Achieved      PT SHORT TERM GOAL #2   Title Pt will demonstrate improved flexibility in hamstrings by at least 5 degrees with no increase in pain,    Baseline -42 degrees from full extension/90/90 position; -35 degrees from full extension in 90/90 position supine, 02/07/21    Time 4    Period Weeks    Status Achieved      PT SHORT TERM GOAL #3   Title Pt will improve 5x sit<>stand score to less than or equal to 14 sec to improve functional strength and transfer efficiency.    Baseline 17.75 sec; 12.12 sec    Time 4    Period Weeks    Status Achieved      PT SHORT TERM GOAL #4   Title Pt will report decrease in pain by 50% or more at worst, with gait activities, for improved functional  mobility with decreased pain.    Baseline 7/10 at worst; 3/10 at worst 02/07/2021    Time 4    Period Weeks    Status Achieved               PT Long Term Goals - 02/18/21 8412  PT LONG TERM GOAL #1   Title Pt will be independent with progression of HEP for improved functional mobility, strength, balance, decreased pain.  TARGET 03/11/2021    Time 8    Period Weeks    Status New      PT LONG TERM GOAL #2   Title Pt will improve 5x sit<>stand to less than or equal to 12.5 seconds for improved functional strength.    Baseline 12.12 sec 6/17    Time 8    Period Weeks    Status Achieved      PT LONG TERM GOAL #3   Title Pt will improve TUG score to less than or equal to 13.5 sec for decreased fall risk.    Time 8    Period Weeks    Status New      PT LONG TERM GOAL #4   Title Pt will improve gait velocity to at least 2.2 ft/sec for improved gait efficiency and safety.    Time 8    Period Weeks    Status New      PT LONG TERM GOAL #5   Title Pt will improve hamstring flexibility by 10 degrees from baseline, for improved overall functional mobility.    Time 8    Period Weeks    Status New                   Plan - 02/22/21 1521     Clinical Impression Statement Session focused on gait, balance and strengthening.  Pt benefitting from buoyancy of water with activities to perform activities that pt would be unable to perform on land safely.  Cont per poc.    Personal Factors and Comorbidities Comorbidity 3+    Comorbidities See problem list    Examination-Activity Limitations Locomotion Level;Transfers;Squat;Stand    Examination-Participation Restrictions Community Activity;Shop;Other   Community fitness   Stability/Clinical Decision Making Evolving/Moderate complexity    Rehab Potential Good    PT Frequency 2x / week    PT Duration 8 weeks   plus eval   PT Treatment/Interventions ADLs/Self Care Home Management;Aquatic Therapy;Moist  Heat;Ultrasound;Cryotherapy;Scientist, product/process development;Neuromuscular re-education;Balance training;Therapeutic exercise;Therapeutic activities;Functional mobility training;Gait training;Patient/family education;Manual techniques;Passive range of motion    PT Next Visit Plan 10th visit progress note. work on gait with/without walking pole (use of walking poles for faciltiation of arm swing) for improved confidence with gait and mobility.  Gait, turns, compliant surfaces, standing stregntheing, use of standing PWR! Moves, consider use of SciFit seated stepper    Consulted and Agree with Plan of Care Patient             Patient will benefit from skilled therapeutic intervention in order to improve the following deficits and impairments:  Abnormal gait, Difficulty walking, Decreased range of motion, Pain, Decreased balance, Impaired flexibility, Decreased mobility, Decreased strength, Postural dysfunction  Visit Diagnosis: Other abnormalities of gait and mobility  Unsteadiness on feet     Problem List Patient Active Problem List   Diagnosis Date Noted   Anemia 03/16/2020   Coronary artery disease involving native coronary artery of native heart without angina pectoris 06/12/2019   Abnormal stress test    Diastolic dysfunction 42/39/5320   Cough 05/10/2018   Rheumatoid arthritis ([place]) 01/08/2018   Hand pain, right 10/15/2017   Vitamin D deficiency 12/20/2016   Prediabetes 12/20/2016   Shortness of breath on exertion 11/27/2016   Preventative health care 12/19/2015   Constipation 12/07/2015   BCC (basal cell carcinoma of skin)  10/05/2014   Rosacea 10/05/2014   Parkinson's disease ([place]) 05/11/2014   Visual floaters 05/11/2014   Paralysis agitans ([place]) 01/08/2014   Screening for cervical cancer 07/17/2012   Foot pain, bilateral 06/19/2012   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 [place], [place], [address] Phone: [phone]   Fax:  [phone]  Name: BRAELEE HERRLE MRN: [id number] Date of Birth: August 05, 1951

## 2021-02-24 ENCOUNTER — Other Ambulatory Visit: Payer: Self-pay

## 2021-02-24 ENCOUNTER — Ambulatory Visit (INDEPENDENT_AMBULATORY_CARE_PROVIDER_SITE_OTHER): Payer: Medicare Other | Admitting: Family Medicine

## 2021-02-24 DIAGNOSIS — I10 Essential (primary) hypertension: Secondary | ICD-10-CM | POA: Diagnosis not present

## 2021-02-24 DIAGNOSIS — R7303 Prediabetes: Secondary | ICD-10-CM | POA: Diagnosis not present

## 2021-02-24 DIAGNOSIS — G20 Parkinson's disease: Secondary | ICD-10-CM

## 2021-02-24 DIAGNOSIS — G20A1 Parkinson's disease without dyskinesia, without mention of fluctuations: Secondary | ICD-10-CM

## 2021-02-24 DIAGNOSIS — E782 Mixed hyperlipidemia: Secondary | ICD-10-CM | POA: Diagnosis not present

## 2021-02-24 DIAGNOSIS — F418 Other specified anxiety disorders: Secondary | ICD-10-CM

## 2021-02-24 DIAGNOSIS — R682 Dry mouth, unspecified: Secondary | ICD-10-CM | POA: Diagnosis not present

## 2021-02-24 NOTE — Patient Instructions (Signed)
Spry or Bioten for dry mouth  Dry Eye  Dry eye, also called keratoconjunctivitis sicca, is dryness of the membranes surrounding the eye. It happens when there are not enough healthy, natural tears in the eyes. The eyes must remain moist at all times. A small amount of tears is constantly produced by the tear glands (lacrimal glands). These glands are located under the outside part of the upper eyelids. Dry eye can happen on its own or be a symptom of several conditions, such as rheumatoid arthritis, lupus, or Sjgren's syndrome. Dry eye may be mild tosevere. What are the causes? This condition may be caused by: Not making enough tears (aqueous tear-deficient dry eyes). Tears evaporating from the eyes too quickly (evaporative dry eyes). This is when there is an abnormality in the quality of your tears. This abnormality causes your tears to evaporate so quickly that the eyes cannot be kept moist. What increases the risk? You are more likely to develop this condition if you: Are a woman, especially if you have gone through menopause. Live in a dry climate. Live in a dusty or smoky area. Take certain medicines, such as: Anti-allergy medicines (antihistamines). Blood pressure medicines (antihypertensives). Birth control pills (oral contraceptives). Laxatives. Tranquilizers. Have a history of refractive eye surgery, such as LASIK. Have a history of long-term contact lens use. What are the signs or symptoms? Symptoms of this condition include: Irritation. Itchiness. Redness. Burning. Inflammation of the eyelids. Feeling as though something is stuck in the eye. Light sensitivity. Increased sensitivity and discomfort when wearing contact lenses. Vision that varies throughout the day. Occasional excessive tearing. How is this diagnosed? This condition is diagnosed based on your symptoms, your medical history, and an eye exam. Your health care provider may look at your eye using a  microscope and may put dyes in your eye to check the health of the surface of your eye. You may have tests, such as a test to evaluate your tear production (Schirmer test). During this test, a small strip of special paper is gently pressed into the inner corner of your eye. Your tear production is measured by how much of the paper is moistened by your tears during a set amount of time. You may be referred to a health care provider who specializes in eyes and eyesight (ophthalmologist). How is this treated? Treatment for this condition depends on the severity. Mild cases are often treated at home. To help relieve your symptoms, your health care provider may recommend eye drops, which are also called artificial tears. If your condition is severe, treatment may include: Prescription eye drops. Over-the-counter or prescription ointments to moisten your eyes. Minor surgery to place plugs into the tear ducts. This keep tears from draining so that tears can stay on the surface of the eye longer. Medicines to reduce inflammation of the eyelids. Taking an omega-3 fatty acid nutritional supplement. Follow these instructions at home: Take or apply over-the-counter and prescription medicines only as told by your health care provider. This includes eye drops. If directed, apply a warm compress to your eyes to help reduce inflammation. Place a towel over your eyes and gently press the warm compress over your eyes for about 5 minutes, or as long as told by your health care provider. Drink plenty of fluids to stay well hydrated. If possible, avoid dry, drafty environments. Wear sunglasses when outdoors to protect your eyes from the sun and wind. Use a humidifier at home to increase moisture in the air. Remember to  blink often when reading or using the computer for long periods. If you wear contact lenses, remove them regularly to give your eyes a break. Always remove contacts before sleeping. Have a yearly eye  exam and vision test. Keep all follow-up visits as told by your health care provider. This is important. Contact a health care provider if: You have eye pain. You have pus-like fluid coming from your eye. Your symptoms get worse or do not improve with treatment. Get help right away if: Your vision suddenly changes. Summary Dry eye is dryness of the membranes surrounding the eye. Dry eye can happen on its own or be a symptom of several conditions, such as rheumatoid arthritis, lupus, or Sjgren's syndrome. This condition is diagnosed based on your symptoms, your medical history, and an eye exam. Treatment for this condition depends on the severity. Mild cases are often treated at home. To help relieve your symptoms, your health care provider may recommend eye drops, which are also called artificial tears. This information is not intended to replace advice given to you by your health care provider. Make sure you discuss any questions you have with your healthcare provider. Document Revised: 02/12/2018 Document Reviewed: 02/12/2018 Elsevier Patient Education  [employer] Torres.

## 2021-02-24 NOTE — Assessment & Plan Note (Signed)
Encourage heart healthy diet such as MIND or DASH diet, increase exercise, avoid trans fats, simple carbohydrates and processed foods, consider a krill or fish or flaxseed oil cap daily. Tolerating Rosuvastin

## 2021-02-24 NOTE — Assessment & Plan Note (Signed)
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 She is now in rehab and when she exercises the pain is much better. Follows with neurology. She did have a fall today due to uneven ground and she lost her balance no injury.

## 2021-02-24 NOTE — Assessment & Plan Note (Signed)
And some dry mouth and dehydration, increase hydration to 60-80 ounce daily and spread it out through out he day. Use spry and biotin products for dry mouth and hydrating eye drops.

## 2021-02-25 ENCOUNTER — Ambulatory Visit: Payer: Medicare Other | Admitting: Physical Therapy

## 2021-02-25 ENCOUNTER — Other Ambulatory Visit: Payer: Self-pay | Admitting: Neurology

## 2021-02-25 DIAGNOSIS — R2689 Other abnormalities of gait and mobility: Secondary | ICD-10-CM

## 2021-02-25 DIAGNOSIS — M79604 Pain in right leg: Secondary | ICD-10-CM | POA: Diagnosis not present

## 2021-02-25 DIAGNOSIS — R29898 Other symptoms and signs involving the musculoskeletal system: Secondary | ICD-10-CM | POA: Diagnosis not present

## 2021-02-25 DIAGNOSIS — R2681 Unsteadiness on feet: Secondary | ICD-10-CM | POA: Diagnosis not present

## 2021-02-25 NOTE — Telephone Encounter (Signed)
Pt called in needing to get her carbidopa-levodopa refilled. She thought the pharmacy sent a refill request at the beginning of the week, but they must not have. She is going out of town tomorrow and would like to see if she can get it filled today.

## 2021-02-25 NOTE — Therapy (Signed)
[facility] [address] [place], [place], [address] Phone: [phone]   Fax:  [phone]  Physical Therapy Treatment  Patient Details  Name: Kristin [name] MRN: [id number] Date of Birth: [birth year] Referring Provider (PT): Gwendlyn Deutscher, [place] (([facility]); has also seen Dr. Vertell Limber)   Encounter Date: 02/25/2021   PT End of Session - 02/25/21 0759     Visit Number 11    Number of Visits 17    Date for PT Re-Evaluation 28/00/34   90 day cert for 8 wk POC   Authorization Type Medicare/[employer]    PT Start Time 0800    PT Stop Time 0843    PT Time Calculation (min) 43 min    Equipment Utilized During Treatment Other (comment)   pool noodle, ankle buoyancy cuff   Activity Tolerance Patient tolerated treatment well   no pain in hamstrings, decrease in ITB pain at end of session   Behavior During Therapy [facility] for tasks assessed/performed             Past Medical History:  Diagnosis Date   Abnormal vaginal Pap smear    Acute pharyngitis 02/25/2013   Anemia    Anxiety    Arthritis    BCC (basal cell carcinoma of skin) 10/05/2014   On back   Chicken pox as a child   Chronic UTI    sees dr Terance Hart   Constipation 12/07/2015   Depression with anxiety 11/02/2009   Qualifier: Diagnosis of  By: [name], LPN, Bonnye M    Dermatitis 07/17/2012   Esophageal stricture 1994   Fibroids    Foot pain, bilateral 06/19/2012   GERD (gastroesophageal reflux disease)    GERD (gastroesophageal reflux disease)    Hiatal hernia    Hyperglycemia 08/19/2013   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   Hyperlipidemia    Hypertension    Infertility, female    Low back pain 10/17/2007   Qualifier: Diagnosis of  By: [name], [name]    Measles as a child   Obesity    Osteoarthritis    Parkinson disease ([place])    Plantar fasciitis of left foot 06/19/2012   Preventative health care 12/19/2015   Rheumatoid arthritis ([place]) 01/08/2018   Rosacea  10/05/2014   Swallowing difficulty    Urinary frequency 02/25/2013   Visual floaters 05/11/2014    Past Surgical History:  Procedure Laterality Date   ABDOMINAL HYSTERECTOMY  2006   total   esophageal     stretching   laporoscopy     LEFT HEART CATH AND CORONARY ANGIOGRAPHY N/A 06/02/2019   Procedure: LEFT HEART CATH AND CORONARY ANGIOGRAPHY;  Surgeon: Jettie Booze, MD;  Location: [facility];  Service: Cardiovascular;  Laterality: N/A;   TONSILLECTOMY     TOTAL HIP ARTHROPLASTY Right 2010   wisdom teeth extracted      There were no vitals filed for this visit.   Subjective Assessment - 02/25/21 0758     Subjective Have had a pretty good week, went to the pool and did the pool therapy.  I did have a fall yesterday, in a friend's driveway-lost balance and sat down on the curb.  Didn't hurt anything and was able to get up on my own.    Pertinent History See problem list    Patient Stated Goals To get back in the pool; to get back strength in my legs.    Currently in Pain? No/denies    Pain  Onset More than a month ago                               [facility] Adult PT Treatment/Exercise - 02/25/21 0001       Ambulation/Gait   Ambulation/Gait Yes    Ambulation/Gait Assistance 5: Supervision;6: Modified independent (Device/Increase time)    Ambulation Distance (Feet) 345 Feet   100 ft x 2   Assistive device None;Other (Comment)   Pt's single walking pole   Gait Pattern Step-through pattern;Decreased arm swing - right;Decreased step length - right;Decreased step length - left;Decreased stance time - right;Decreased weight shift to right;Narrow base of support    Ambulation Surface Level;Indoor    Gait Comments No device, resisted gait with blue theraband at hips, cues to encourage upright posture and increased step length (PWR! UP posture) with gait.  Then resistance removed and continued to encourage posture and step length.  Brief stop/starts with cues to  regain balance in between.      High Level Balance   High Level Balance Comments Pt stands EO and EC 30 sec and on foam EO/EC 30 seconds, both situations with EC noted increased sway.  Discussed that this means potential decrease in vestibular system use for balance; discussed that this may be due to overall decrease in activity level over past months.                 Balance Exercises - 02/25/21 0001       Balance Exercises: Standing   Standing Eyes Opened Wide (BOA);Narrow base of support (BOS);Foam/compliant surface;Limitations    Standing Eyes Opened Limitations Head turns/nods x 5 reps    Standing Eyes Closed Narrow base of support (BOS);Foam/compliant surface;Wide (BOA);Limitations    Standing Eyes Closed Limitations Head turns/nods x 5 reps    Wall Bumps Hip;Eyes opened;10 reps   2 sets   Stepping Strategy Anterior;Posterior;Lateral;10 reps;UE support;Foam/compliant surface    Rockerboard Anterior/posterior;Lateral;EO;Limitations    Rockerboard Limitations Ankle/hip strategy work, head turns/nods x 5 reps EO, BUE throughout    Water quality scientist;Upper extremity support;Foam/compliant surface;3 reps;Limitations    Tandem Gait Limitations Progressed to 2 additional laps wtih tandem gait, side step to ground, then back to beam, minimal UE support    Sidestepping Foam/compliant support;Upper extremity support;3 reps;Limitations    Marching Foam/compliant surface;Upper extremity assist 2;Static;10 reps;Limitations    Marching Limitations On Airex    Heel Raises Both;10 reps   On Airex, 2 sets   Toe Raise Both;10 reps;Limitations    Toe Raise Limitations On airex   2 sets                PT Short Term Goals - 02/18/21 0825       PT SHORT TERM GOAL #1   Title Pt will be independent with HEP for improved posture, strength, decreased pain, and improved gait.  TARGET 02/11/2021    Time 4    Period Weeks    Status Achieved      PT SHORT TERM GOAL #2   Title Pt will  demonstrate improved flexibility in hamstrings by at least 5 degrees with no increase in pain,    Baseline -42 degrees from full extension/90/90 position; -35 degrees from full extension in 90/90 position supine, 02/07/21    Time 4    Period Weeks    Status Achieved      PT SHORT TERM GOAL #3   Title Pt  will improve 5x sit<>stand score to less than or equal to 14 sec to improve functional strength and transfer efficiency.    Baseline 17.75 sec; 12.12 sec    Time 4    Period Weeks    Status Achieved      PT SHORT TERM GOAL #4   Title Pt will report decrease in pain by 50% or more at worst, with gait activities, for improved functional mobility with decreased pain.    Baseline 7/10 at worst; 3/10 at worst 02/07/2021    Time 4    Period Weeks    Status Achieved               PT Long Term Goals - 02/18/21 0847       PT LONG TERM GOAL #1   Title Pt will be independent with progression of HEP for improved functional mobility, strength, balance, decreased pain.  TARGET 03/11/2021    Time 8    Period Weeks    Status New      PT LONG TERM GOAL #2   Title Pt will improve 5x sit<>stand to less than or equal to 12.5 seconds for improved functional strength.    Baseline 12.12 sec 6/17    Time 8    Period Weeks    Status Achieved      PT LONG TERM GOAL #3   Title Pt will improve TUG score to less than or equal to 13.5 sec for decreased fall risk.    Time 8    Period Weeks    Status New      PT LONG TERM GOAL #4   Title Pt will improve gait velocity to at least 2.2 ft/sec for improved gait efficiency and safety.    Time 8    Period Weeks    Status New      PT LONG TERM GOAL #5   Title Pt will improve hamstring flexibility by 10 degrees from baseline, for improved overall functional mobility.    Time 8    Period Weeks    Status New                   Plan - 02/25/21 6599     Clinical Impression Statement Skilled PT session focused on balance and gait, as pt has had  > 1 week with continued no c/o pain in posterior thigh.  Her most consistent c/o is decreased confidence with balance, gait, with one reported fall on unlevel surface yesterday.  She requires UE support for compliant surface work, and she responds well to cues for PWR! UP for improved posture with standing and gait.    Personal Factors and Comorbidities Comorbidity 3+    Comorbidities See problem list    Examination-Activity Limitations Locomotion Level;Transfers;Squat;Stand    Examination-Participation Restrictions Community Activity;Shop;Other   Community fitness   Stability/Clinical Decision Making Evolving/Moderate complexity    Rehab Potential Good    PT Frequency 2x / week    PT Duration 8 weeks   plus eval   PT Treatment/Interventions ADLs/Self Care Home Management;Aquatic Therapy;Moist Heat;Ultrasound;Cryotherapy;Scientist, product/process development;Neuromuscular re-education;Balance training;Therapeutic exercise;Therapeutic activities;Functional mobility training;Gait training;Patient/family education;Manual techniques;Passive range of motion    PT Next Visit Plan Pt on vacation next week; continue to work on gait with/without walking pole (use of walking poles for faciltiation of arm swing) for improved confidence with gait and mobility.  Gait, turns, compliant surfaces, standing stregntheing, use of standing PWR! Moves, consider use of SciFit seated stepper  Consulted and Agree with Plan of Care Patient             Patient will benefit from skilled therapeutic intervention in order to improve the following deficits and impairments:  Abnormal gait, Difficulty walking, Decreased range of motion, Pain, Decreased balance, Impaired flexibility, Decreased mobility, Decreased strength, Postural dysfunction  Visit Diagnosis: Unsteadiness on feet  Other abnormalities of gait and mobility  Other symptoms and signs involving the musculoskeletal system     Problem List Patient  Active Problem List   Diagnosis Date Noted   Dry mouth 02/24/2021   Anemia 03/16/2020   Coronary artery disease involving native coronary artery of native heart without angina pectoris 06/12/2019   Abnormal stress test    Diastolic dysfunction 18/48/5927   Cough 05/10/2018   Rheumatoid arthritis ([employer]) 01/08/2018   Hand pain, right 10/15/2017   Vitamin D deficiency 12/20/2016   Prediabetes 12/20/2016   Shortness of breath on exertion 11/27/2016   Preventative health care 12/19/2015   Constipation 12/07/2015   BCC (basal cell carcinoma of skin) 10/05/2014   Rosacea 10/05/2014   Parkinson's disease ([place]) 05/11/2014   Visual floaters 05/11/2014   Paralysis agitans ([place]) 01/08/2014   Screening for cervical cancer 07/17/2012   Foot pain, bilateral 06/19/2012   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 02/25/2021, 12:04 PM Frazier Butt., PT   [facility] [address] [place], [place], [address] Phone: [phone]   Fax:  [phone]  Name: CHINWE LOPE MRN: [id number] Date of Birth: [birth year]

## 2021-02-27 NOTE — Assessment & Plan Note (Signed)
Well controlled, no changes to meds. Encouraged heart healthy diet such as the DASH diet and exercise as tolerated.  °

## 2021-02-27 NOTE — Assessment & Plan Note (Signed)
hgba1c acceptable, minimize simple carbs. Increase exercise as tolerated. Continue current meds 

## 2021-02-27 NOTE — Progress Notes (Signed)
Subjective:    Patient ID: Kristin [name], female    DOB: December 03, 1950, 70 y.o.   MRN: [id number]  Chief Complaint  Patient presents with   Follow-up    HPI Patient is in today for follow up on chronic medical concerns including Parkinson's Disease, hyperglycemia and more. She feels well today. No recent febrile illness or hospitalizations. She denies any polyuria or polydipsia. She has been diagnosed with sleep apnea but has not been able to get her CPAP yet. She continues to follow with neurology and they have been managing her Parkinson's Disease which is stable. No recent febrile illness or hospitalizations. Denies CP/palp/SOB/HA/congestion/fevers/GI or GU c/o. Taking meds as prescribed   Past Medical History:  Diagnosis Date   Abnormal vaginal Pap smear    Acute pharyngitis 02/25/2013   Anemia    Anxiety    Arthritis    BCC (basal cell carcinoma of skin) 10/05/2014   On back   Chicken pox as a child   Chronic UTI    sees dr Terance Hart   Constipation 12/07/2015   Depression with anxiety 11/02/2009   Qualifier: Diagnosis of  By: [name], LPN, Bonnye M    Dermatitis 07/17/2012   Esophageal stricture 1994   Fibroids    Foot pain, bilateral 06/19/2012   GERD (gastroesophageal reflux disease)    GERD (gastroesophageal reflux disease)    Hiatal hernia    Hyperglycemia 08/19/2013   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   Hyperlipidemia    Hypertension    Infertility, female    Low back pain 10/17/2007   Qualifier: Diagnosis of  By: [name], [name]    Measles as a child   Obesity    Osteoarthritis    Parkinson disease ([place])    Plantar fasciitis of left foot 06/19/2012   Preventative health care 12/19/2015   Rheumatoid arthritis ([place]) 01/08/2018   Rosacea 10/05/2014   Swallowing difficulty    Urinary frequency 02/25/2013   Visual floaters 05/11/2014    Past Surgical History:  Procedure Laterality Date   ABDOMINAL HYSTERECTOMY  2006   total   esophageal     stretching   laporoscopy      LEFT HEART CATH AND CORONARY ANGIOGRAPHY N/A 06/02/2019   Procedure: LEFT HEART CATH AND CORONARY ANGIOGRAPHY;  Surgeon: Jettie Booze, MD;  Location: [facility];  Service: Cardiovascular;  Laterality: N/A;   TONSILLECTOMY     TOTAL HIP ARTHROPLASTY Right 2010   wisdom teeth extracted      Family History  Problem Relation Age of Onset   Cancer Mother        breast   Other Mother        arrythmia   Mental illness Mother        bipolar   Hyperlipidemia Mother    Thyroid disease Mother    Depression Mother    Bipolar disorder Mother    Breast cancer Mother 37   Heart disease Father    Arthritis Father        rheumatoid   Hypertension Father    Hyperlipidemia Father    Depression Sister    Mental illness Sister        bipolar   Parkinson's disease Sister    Arthritis Sister    Arthritis Sister    Arthritis Sister    Breast cancer Maternal Aunt 35   Breast cancer Maternal Uncle    Colon cancer Neg Hx    Esophageal cancer Neg Hx  Rectal cancer Neg Hx    Stomach cancer Neg Hx     Social History   Socioeconomic History   Marital status: Married    Spouse name: Not on file   Number of children: 1   Years of education: Not on file   Highest education level: Master's degree (e.g., MA, MS, MEng, MEd, MSW, MBA)  Occupational History   Occupation: Retired Teacher, music  Tobacco Use   Smoking status: Never   Smokeless tobacco: Never  Vaping Use   Vaping Use: Never used  Substance and Sexual Activity   Alcohol use: Not Currently   Drug use: No   Sexual activity: Not Currently    Partners: Male  Other Topics Concern   Not on file  Social History Narrative   Lives with husband, no major dietary restrictions, retired from teaching      Husband dx with cancer MDS June 2017.   Currently in remission. (11/03/16 pc)      Pt has one biological child the other 3 are adopted      Social Determinants of Health   Financial Resource Strain: Low Risk     Difficulty of Paying Living Expenses: Not hard at all  Food Insecurity: No Food Insecurity   Worried About Charity fundraiser in the Last Year: Never true   [place] in the Last Year: Never true  Transportation Needs: No Transportation Needs   Lack of Transportation (Medical): No   Lack of Transportation (Non-Medical): No  Physical Activity: Sufficiently Active   Days of Exercise per Week: 6 days   Minutes of Exercise per Session: 40 min  Stress: No Stress Concern Present   Feeling of Stress : Not at all  Social Connections: Moderately Integrated   Frequency of Communication with Friends and Family: More than three times a week   Frequency of Social Gatherings with Friends and Family: Once a week   Attends Religious Services: 1 to 4 times per year   Active Member of [employer] or Organizations: No   Attends Archivist Meetings: Never   Marital Status: Married  Human resources officer Violence: Not At Risk   Fear of Current or Ex-Partner: No   Emotionally Abused: No   Physically Abused: No   Sexually Abused: No    Outpatient Medications Prior to Visit  Medication Sig Dispense Refill   carbidopa-levodopa (SINEMET CR) 50-200 MG tablet TAKE 1 TABLET BY MOUTH EVERYDAY AT BEDTIME 90 tablet 0   carbidopa-levodopa (SINEMET IR) 25-100 MG tablet TAKE 2 TABLETS BY MOUTH AT 7AM, 2TABS AT 11AM, 1TAB AT 4PM 450 tablet 1   carvedilol (COREG) 3.125 MG tablet Take 1 tablet (3.125 mg total) by mouth 2 (two) times daily with a meal. 180 tablet 1   DULoxetine (CYMBALTA) 30 MG capsule TAKE 1 CAPSULE BY MOUTH EVERY DAY 90 capsule 1   escitalopram (LEXAPRO) 20 MG tablet Take 1 tablet (20 mg total) by mouth daily. 90 tablet 1   folic acid (FOLVITE) 1 MG tablet Take 1 mg by mouth 3 (three) times daily.      glycerin adult 2 g suppository Place 1 suppository rectally as needed for constipation. 15 suppository 0   hydroxychloroquine (PLAQUENIL) 200 MG tablet Take 2 tablets (400 mg total) by mouth  daily.     losartan (COZAAR) 50 MG tablet TAKE 1 TABLET BY MOUTH EVERY DAY 90 tablet 3   methotrexate 250 MG/10ML injection methotrexate sodium 25 mg/mL injection solution  INJECT 0.8MLS  SUBCUTANEOUSLY EVERY 7 DAYS     polyethylene glycol (MIRALAX / GLYCOLAX) 17 g packet Take 17 g by mouth every other day.     pramipexole (MIRAPEX) 0.5 MG tablet TAKE 1 TABLET BY MOUTH 3 TIMES DAILY. 162 tablet 0   rosuvastatin (CRESTOR) 10 MG tablet TAKE 1 TABLET BY MOUTH ON MONDAYS, WEDNESDAYS AND FRIDAYS 54 tablet 1   carbidopa-levodopa (PARCOPA) 25-100 MG disintegrating tablet Take 1 tablet by mouth 5 (five) times daily.     B-D TB SYRINGE 1CC/27GX1/2" 27G X 1/2" 1 ML MISC      Cholecalciferol (VITAMIN D3) 50 MCG (2000 UT) TABS Take 1 capsule by mouth daily. Take daily everyday except Thursdays (Patient not taking: No sig reported)     Ferrous Fumarate-Folic Acid (HEMATINIC/FOLIC ACID) 626-9 MG TABS Take 1 tablet by mouth daily. (Patient not taking: No sig reported) 90 tablet 3   HYDROcodone-acetaminophen (NORCO/VICODIN) 5-325 MG tablet Take 1 tablet by mouth every 6 (six) hours as needed. (Patient not taking: Reported on 01/12/2021)     methotrexate 50 MG/2ML injection Inject 20 mg into the skin once a week.     tiZANidine (ZANAFLEX) 4 MG tablet Take 0.5-1 tablets (2-4 mg total) by mouth 2 (two) times daily as needed for muscle spasms. (Patient not taking: Reported on 01/12/2021) 30 tablet 0   traMADol (ULTRAM) 50 MG tablet Take 50 mg by mouth every 6 (six) hours as needed. (Patient not taking: Reported on 01/12/2021)     No facility-administered medications prior to visit.    Allergies  Allergen Reactions   Prednisone Dermatitis    Facial ulcers   Bactrim [Sulfamethoxazole-Trimethoprim] Other (See Comments)    Oral ulcers and rash   Penicillins Hives    Did it involve swelling of the face/tongue/throat, SOB, or low BP? No Did it involve sudden or severe rash/hives, skin peeling, or any reaction on the  inside of your mouth or nose? No Did you need to seek medical attention at a hospital or doctor's office? No When did it last happen?      30+ years ago If all above answers are "NO", may proceed with cephalosporin use.     Review of Systems  Constitutional:  Negative for fever and malaise/fatigue.  HENT:  Negative for congestion.   Eyes:  Negative for blurred vision.  Respiratory:  Negative for shortness of breath.   Cardiovascular:  Negative for chest pain, palpitations and leg swelling.  Gastrointestinal:  Negative for abdominal pain, blood in stool and nausea.  Genitourinary:  Negative for dysuria and frequency.  Musculoskeletal:  Positive for falls, joint pain and myalgias.  Skin:  Negative for rash.  Neurological:  Positive for tremors and weakness. Negative for dizziness, loss of consciousness and headaches.  Endo/Heme/Allergies:  Negative for environmental allergies.  Psychiatric/Behavioral:  Negative for depression. The patient is nervous/anxious.       Objective:    Physical Exam Constitutional:      General: She is not in acute distress.    Appearance: She is well-developed.  HENT:     Head: Normocephalic and atraumatic.  Eyes:     Conjunctiva/sclera: Conjunctivae normal.  Neck:     Thyroid: No thyromegaly.  Cardiovascular:     Rate and Rhythm: Normal rate and regular rhythm.     Heart sounds: Normal heart sounds. No murmur heard. Pulmonary:     Effort: Pulmonary effort is normal. No respiratory distress.     Breath sounds: Normal breath sounds.  Abdominal:  General: Bowel sounds are normal. There is no distension.     Palpations: Abdomen is soft. There is no mass.     Tenderness: There is no abdominal tenderness.  Musculoskeletal:     Cervical back: Neck supple.  Lymphadenopathy:     Cervical: No cervical adenopathy.  Skin:    General: Skin is warm and dry.  Neurological:     Mental Status: She is alert and oriented to person, place, and time.   Psychiatric:        Behavior: Behavior normal.    There were no vitals taken for this visit. Wt Readings from Last 3 Encounters:  12/10/20 235 lb (106.6 kg)  12/07/20 232 lb 6.4 oz (105.4 kg)  11/03/20 232 lb (105.2 kg)    Diabetic Foot Exam - Simple   No data filed    Lab Results  Component Value Date   WBC 10.1 12/10/2020   HGB 14.0 12/10/2020   HCT 41.3 12/10/2020   PLT 246 12/10/2020   GLUCOSE 121 (H) 12/10/2020   CHOL 155 12/07/2020   TRIG 108.0 12/07/2020   HDL 67.20 12/07/2020   LDLDIRECT 161.5 10/07/2008   LDLCALC 66 12/07/2020   ALT 20 12/10/2020   AST 19 12/10/2020   NA 144 12/10/2020   K 3.4 (L) 12/10/2020   CL 102 12/10/2020   CREATININE 0.85 12/10/2020   BUN 20 12/10/2020   CO2 28 12/10/2020   TSH 1.15 12/07/2020   INR 2.5 (H) 01/19/2009   HGBA1C 5.8 12/07/2020    Lab Results  Component Value Date   TSH 1.15 12/07/2020   Lab Results  Component Value Date   WBC 10.1 12/10/2020   HGB 14.0 12/10/2020   HCT 41.3 12/10/2020   MCV 91.0 12/10/2020   PLT 246 12/10/2020   Lab Results  Component Value Date   NA 144 12/10/2020   K 3.4 (L) 12/10/2020   CO2 28 12/10/2020   GLUCOSE 121 (H) 12/10/2020   BUN 20 12/10/2020   CREATININE 0.85 12/10/2020   BILITOT 1.1 12/10/2020   ALKPHOS 67 12/10/2020   AST 19 12/10/2020   ALT 20 12/10/2020   PROT 7.6 12/10/2020   ALBUMIN 4.8 12/10/2020   CALCIUM 10.1 12/10/2020   ANIONGAP 14 12/10/2020   GFR 88.14 12/07/2020   Lab Results  Component Value Date   CHOL 155 12/07/2020   Lab Results  Component Value Date   HDL 67.20 12/07/2020   Lab Results  Component Value Date   LDLCALC 66 12/07/2020   Lab Results  Component Value Date   TRIG 108.0 12/07/2020   Lab Results  Component Value Date   CHOLHDL 2 12/07/2020   Lab Results  Component Value Date   HGBA1C 5.8 12/07/2020       Assessment & Plan:   Problem List Items Addressed This Visit     Hyperlipidemia    Encourage heart healthy  diet such as MIND or DASH diet, increase exercise, avoid trans fats, simple carbohydrates and processed foods, consider a krill or fish or flaxseed oil cap daily. Tolerating Rosuvastin       Depression with anxiety    She is managing well on current medications. No changes       Essential hypertension    Well controlled, no changes to meds. Encouraged heart healthy diet such as the DASH diet and exercise as tolerated.        Parkinson's disease ([place])    She has struggled with right leg pain and  they have determined that the parkinson's have caused her leg to rotate. She is now in rehab and when she exercises the pain is much better. Follows with neurology. She did have a fall today due to uneven ground and she lost her balance no injury.       Prediabetes    hgba1c acceptable, minimize simple carbs. Increase exercise as tolerated. Continue current meds       Dry mouth    And some dry mouth and dehydration, increase hydration to 60-80 ounce daily and spread it out through out he day. Use spry and biotin products for dry mouth and hydrating eye drops.         I have discontinued Laurine Blazer B-D TB SYRINGE 1CC/27GX1/2", Vitamin D3, Hematinic/Folic Acid, traMADol, HYDROcodone-acetaminophen, and tiZANidine. I am also having her maintain her folic acid, hydroxychloroquine, rosuvastatin, losartan, escitalopram, glycerin adult, carbidopa-levodopa, carvedilol, polyethylene glycol, methotrexate, carbidopa-levodopa, DULoxetine, and pramipexole.  No orders of the defined types were placed in this encounter.    [name], MD

## 2021-02-27 NOTE — Assessment & Plan Note (Signed)
She is managing well on current medications. No changes

## 2021-02-28 ENCOUNTER — Ambulatory Visit: Payer: Self-pay | Admitting: Physical Therapy

## 2021-03-01 ENCOUNTER — Ambulatory Visit: Payer: Medicare Other | Admitting: Occupational Therapy

## 2021-03-01 ENCOUNTER — Ambulatory Visit: Payer: Medicare Other | Admitting: Physical Therapy

## 2021-03-08 ENCOUNTER — Ambulatory Visit: Payer: Medicare Other | Admitting: Physical Therapy

## 2021-03-10 ENCOUNTER — Telehealth (INDEPENDENT_AMBULATORY_CARE_PROVIDER_SITE_OTHER): Payer: Medicare Other | Admitting: Family

## 2021-03-10 ENCOUNTER — Other Ambulatory Visit: Payer: Self-pay

## 2021-03-10 ENCOUNTER — Encounter: Payer: Self-pay | Admitting: Family

## 2021-03-10 ENCOUNTER — Telehealth: Payer: Self-pay

## 2021-03-10 VITALS — BP 127/76 | HR 80 | Ht 64.0 in

## 2021-03-10 DIAGNOSIS — U071 COVID-19: Secondary | ICD-10-CM | POA: Diagnosis not present

## 2021-03-10 MED ORDER — NIRMATRELVIR/RITONAVIR (PAXLOVID)TABLET: 3.0000 | ORAL_TABLET | Freq: Two times a day (BID) | ORAL | 0 refills | Status: AC

## 2021-03-10 NOTE — Progress Notes (Signed)
Kristin [name] is a 70 y.o. female with the following history as recorded in EpicCare:  Patient Active Problem List   Diagnosis Date Noted   Dry mouth 02/24/2021   Anemia 03/16/2020   Coronary artery disease involving native coronary artery of native heart without angina pectoris 06/12/2019   Abnormal stress test    Diastolic dysfunction 36/62/9476   Cough 05/10/2018   Rheumatoid arthritis ([place]) 01/08/2018   Hand pain, right 10/15/2017   Vitamin D deficiency 12/20/2016   Prediabetes 12/20/2016   Shortness of breath on exertion 11/27/2016   Preventative health care 12/19/2015   Constipation 12/07/2015   BCC (basal cell carcinoma of skin) 10/05/2014   Rosacea 10/05/2014   Parkinson's disease ([place]) 05/11/2014   Visual floaters 05/11/2014   Paralysis agitans ([place]) 01/08/2014   Screening for cervical cancer 07/17/2012   Foot pain, bilateral 06/19/2012   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   Obesity 11/12/2009   Depression with anxiety 11/02/2009   Urinary tract infection 12/03/2008   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   Lumbar back pain with radiculopathy affecting lower extremity 10/17/2007   Essential hypertension 07/05/2007   Osteoarthritis 07/05/2007   Hyperlipidemia 06/26/2007   H/O: iron deficiency anemia 05/08/2007   Hiatal hernia with GERD 05/08/2007    Current Outpatient Medications  Medication Sig Dispense Refill   carbidopa-levodopa (SINEMET CR) 50-200 MG tablet TAKE 1 TABLET BY MOUTH EVERYDAY AT BEDTIME 30 tablet 0   carbidopa-levodopa (SINEMET IR) 25-100 MG tablet TAKE 2 TABLETS BY MOUTH AT 7AM, 2TABS AT 11AM, 1TAB AT 4PM 450 tablet 1   carvedilol (COREG) 3.125 MG tablet Take 1 tablet (3.125 mg total) by mouth 2 (two) times daily with a meal. 180 tablet 1   DULoxetine (CYMBALTA) 30 MG capsule TAKE 1 CAPSULE BY MOUTH EVERY DAY 90 capsule 1   escitalopram (LEXAPRO) 20 MG tablet Take 1 tablet (20 mg total) by mouth daily. 90 tablet 1   folic acid (FOLVITE) 1  MG tablet Take 1 mg by mouth 3 (three) times daily.      glycerin adult 2 g suppository Place 1 suppository rectally as needed for constipation. 15 suppository 0   hydroxychloroquine (PLAQUENIL) 200 MG tablet Take 2 tablets (400 mg total) by mouth daily.     losartan (COZAAR) 50 MG tablet TAKE 1 TABLET BY MOUTH EVERY DAY 90 tablet 3   methotrexate 250 MG/10ML injection methotrexate sodium 25 mg/mL injection solution  INJECT 0.8MLS SUBCUTANEOUSLY EVERY 7 DAYS     nirmatrelvir/ritonavir EUA (PAXLOVID) TABS Take 3 tablets by mouth 2 (two) times daily for 5 days. (Take nirmatrelvir 150 mg two tablets twice daily for 5 days and ritonavir 100 mg one tablet twice daily for 5 days) Patient GFR is 97 COVID + on 03/10/2021 30 tablet 0   polyethylene glycol (MIRALAX / GLYCOLAX) 17 g packet Take 17 g by mouth every other day.     pramipexole (MIRAPEX) 0.5 MG tablet TAKE 1 TABLET BY MOUTH 3 TIMES DAILY. 162 tablet 0   rosuvastatin (CRESTOR) 10 MG tablet TAKE 1 TABLET BY MOUTH ON MONDAYS, WEDNESDAYS AND FRIDAYS 54 tablet 1   No current facility-administered medications for this visit.    Allergies: Prednisone, Bactrim [sulfamethoxazole-trimethoprim], and Penicillins  Past Medical History:  Diagnosis Date   Abnormal vaginal Pap smear    Acute pharyngitis 02/25/2013   Anemia    Anxiety    Arthritis    BCC (basal cell carcinoma of skin) 10/05/2014   On back   Chicken pox as  a child   Chronic UTI    sees dr Terance Hart   Constipation 12/07/2015   Depression with anxiety 11/02/2009   Qualifier: Diagnosis of  By: [name], LPN, Bonnye M    Dermatitis 07/17/2012   Esophageal stricture 1994   Fibroids    Foot pain, bilateral 06/19/2012   GERD (gastroesophageal reflux disease)    GERD (gastroesophageal reflux disease)    Hiatal hernia    Hyperglycemia 08/19/2013   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   Hyperlipidemia    Hypertension    Infertility, female    Low back pain 10/17/2007   Qualifier: Diagnosis of  By:  [name], [name]    Measles as a child   Obesity    Osteoarthritis    Parkinson disease ([place])    Plantar fasciitis of left foot 06/19/2012   Preventative health care 12/19/2015   Rheumatoid arthritis ([place]) 01/08/2018   Rosacea 10/05/2014   Swallowing difficulty    Urinary frequency 02/25/2013   Visual floaters 05/11/2014    Past Surgical History:  Procedure Laterality Date   ABDOMINAL HYSTERECTOMY  2006   total   esophageal     stretching   laporoscopy     LEFT HEART CATH AND CORONARY ANGIOGRAPHY N/A 06/02/2019   Procedure: LEFT HEART CATH AND CORONARY ANGIOGRAPHY;  Surgeon: Jettie Booze, MD;  Location: [facility];  Service: Cardiovascular;  Laterality: N/A;   TONSILLECTOMY     TOTAL HIP ARTHROPLASTY Right 2010   wisdom teeth extracted      Family History  Problem Relation Age of Onset   Cancer Mother        breast   Other Mother        arrythmia   Mental illness Mother        bipolar   Hyperlipidemia Mother    Thyroid disease Mother    Depression Mother    Bipolar disorder Mother    Breast cancer Mother 75   Heart disease Father    Arthritis Father        rheumatoid   Hypertension Father    Hyperlipidemia Father    Depression Sister    Mental illness Sister        bipolar   Parkinson's disease Sister    Arthritis Sister    Arthritis Sister    Arthritis Sister    Breast cancer Maternal Aunt 35   Breast cancer Maternal Uncle    Colon cancer Neg Hx    Esophageal cancer Neg Hx    Rectal cancer Neg Hx    Stomach cancer Neg Hx     Social History   Tobacco Use   Smoking status: Never   Smokeless tobacco: Never  Substance Use Topics   Alcohol use: Not Currently    Subjective:    I connected with Kristin [name] on 03/10/21 at  4:00 PM EDT by a video enabled telemedicine application and verified that I am speaking with the correct person using two identifiers.   I discussed the limitations of evaluation and management by telemedicine and the  availability of in person appointments. The patient expressed understanding and agreed to proceed. Provider in office/ patient is at home; provider and patient are only 2 people on video call.   Patient tested positive for COVID earlier this morning. She began with her symptoms last night; she has run low grade fever- around 99; no chest pain or shortness of breath; notes that husband is positive as well and was started  on Paxlovid; she would like to use medication if possible;       Objective:  Vitals:   03/10/21 1602  BP: 127/76  Pulse: 80  Height: 5\' 4"  (1.626 m)    General: Well developed, well nourished, in no acute distress  Skin : Warm and dry.  Head: Normocephalic and atraumatic  Lungs: Respirations unlabored;  Neurologic: Alert and oriented; speech intact; face symmetrical; moves all extremities well; CNII-XII intact without focal deficit   Assessment:  1. COVID-19     Plan:  Rx for Paxlovid- risks/benefits of medication discussed; she has not taken her Crestor since last week and understands to stay off the medication for at least 5 days after finishing Paxlovid; she will plan to quarantine for 10 days; symptomatic treatment discussed and encouraged water/ need to stay hydrated; follow up worse, no better.     No follow-ups on file.  No orders of the defined types were placed in this encounter.   Requested Prescriptions   Signed Prescriptions Disp Refills   nirmatrelvir/ritonavir EUA (PAXLOVID) TABS 30 tablet 0    Sig: Take 3 tablets by mouth 2 (two) times daily for 5 days. (Take nirmatrelvir 150 mg two tablets twice daily for 5 days and ritonavir 100 mg one tablet twice daily for 5 days) Patient GFR is 97 COVID + on 03/10/2021

## 2021-03-10 NOTE — Telephone Encounter (Signed)
Patient called stating her and her hsuband have both tested positive for covid today 03/10/21.   Her symptoms began yesterday 03/09/21. She has a low grade fever, head congestion, pressure. She states her husband was prescribed Paxlovid by his provider for covid. She would like something if possible to be sent as well.   She uses [facility].

## 2021-03-10 NOTE — Telephone Encounter (Signed)
Kristin [name] will see her today at 4pm

## 2021-03-11 ENCOUNTER — Ambulatory Visit: Payer: Medicare Other | Admitting: Physical Therapy

## 2021-03-12 ENCOUNTER — Other Ambulatory Visit: Payer: Self-pay | Admitting: Family Medicine

## 2021-03-17 ENCOUNTER — Ambulatory Visit: Payer: Medicare Other

## 2021-03-17 ENCOUNTER — Ambulatory Visit: Payer: Medicare Other | Admitting: Physical Therapy

## 2021-03-17 ENCOUNTER — Ambulatory Visit: Payer: Medicare Other | Admitting: Occupational Therapy

## 2021-03-18 ENCOUNTER — Ambulatory Visit: Payer: Medicare Other | Admitting: Physical Therapy

## 2021-03-22 ENCOUNTER — Telehealth (INDEPENDENT_AMBULATORY_CARE_PROVIDER_SITE_OTHER): Payer: Medicare Other | Admitting: Internal Medicine

## 2021-03-22 VITALS — BP 106/76 | Temp 98.8°F

## 2021-03-22 DIAGNOSIS — J40 Bronchitis, not specified as acute or chronic: Secondary | ICD-10-CM | POA: Diagnosis not present

## 2021-03-22 MED ORDER — AZITHROMYCIN 250 MG PO TABS
ORAL_TABLET | ORAL | 0 refills | Status: DC
Start: 2021-03-22 — End: 2021-04-12

## 2021-03-22 MED ORDER — BENZONATATE 200 MG PO CAPS: 200.0000 mg | ORAL_CAPSULE | Freq: Three times a day (TID) | ORAL | 0 refills | Status: DC | PRN

## 2021-03-22 NOTE — Progress Notes (Signed)
Subjective:    Patient ID: Kristin [name], female    DOB: 24-Oct-1950, 70 y.o.   MRN: [id number]  DOS:  03/22/2021 Type of visit - description: Virtual Visit via Video Note  I connected with the above patient  by a video enabled telemedicine application and verified that I am speaking with the correct person using two identifiers.   THIS ENCOUNTER IS A VIRTUAL VISIT DUE TO COVID-19 - PATIENT WAS NOT SEEN IN THE OFFICE. PATIENT HAS CONSENTED TO VIRTUAL VISIT / TELEMEDICINE VISIT   Location of patient: home  Location of provider: office  Persons participating in the virtual visit: patient, provider   I discussed the limitations of evaluation and management by telemedicine and the availability of in person appointments. The patient expressed understanding and agreed to proceed.  Acute The patient was diagnosed with COVID recently, took paxlovid. At the time she has some fever and cough. Overall feeling much better but has residual cough. Has taking OTC Coricidin with no results.    At this point no chest pain or difficulty breathing No nausea, vomiting, diarrhea. No sputum production.    Review of Systems See above   Past Medical History:  Diagnosis Date   Abnormal vaginal Pap smear    Acute pharyngitis 02/25/2013   Anemia    Anxiety    Arthritis    BCC (basal cell carcinoma of skin) 10/05/2014   On back   Chicken pox as a child   Chronic UTI    sees dr Terance Hart   Constipation 12/07/2015   Depression with anxiety 11/02/2009   Qualifier: Diagnosis of  By: [name], LPN, Bonnye M    Dermatitis 07/17/2012   Esophageal stricture 1994   Fibroids    Foot pain, bilateral 06/19/2012   GERD (gastroesophageal reflux disease)    GERD (gastroesophageal reflux disease)    Hiatal hernia    Hyperglycemia 08/19/2013   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   Hyperlipidemia    Hypertension    Infertility, female    Low back pain 10/17/2007   Qualifier: Diagnosis of  By: [name], [name]    Measles as a child   Obesity    Osteoarthritis    Parkinson disease ([place])    Plantar fasciitis of left foot 06/19/2012   Preventative health care 12/19/2015   Rheumatoid arthritis ([place]) 01/08/2018   Rosacea 10/05/2014   Swallowing difficulty    Urinary frequency 02/25/2013   Visual floaters 05/11/2014    Past Surgical History:  Procedure Laterality Date   ABDOMINAL HYSTERECTOMY  2006   total   esophageal     stretching   laporoscopy     LEFT HEART CATH AND CORONARY ANGIOGRAPHY N/A 06/02/2019   Procedure: LEFT HEART CATH AND CORONARY ANGIOGRAPHY;  Surgeon: Jettie Booze, MD;  Location: [facility];  Service: Cardiovascular;  Laterality: N/A;   TONSILLECTOMY     TOTAL HIP ARTHROPLASTY Right 2010   wisdom teeth extracted      Allergies as of 03/22/2021       Reactions   Prednisone Dermatitis   Facial ulcers   Bactrim [sulfamethoxazole-trimethoprim] Other (See Comments)   Oral ulcers and rash   Penicillins Hives   Did it involve swelling of the face/tongue/throat, SOB, or low BP? No Did it involve sudden or severe rash/hives, skin peeling, or any reaction on the inside of your mouth or nose? No Did you need to seek medical attention at a hospital or doctor's office? No When did  it last happen?      30+ years ago If all above answers are "NO", may proceed with cephalosporin use.        Medication List        Accurate as of March 22, 2021 11:53 AM. If you have any questions, ask your nurse or doctor.          carbidopa-levodopa 25-100 MG tablet Commonly known as: SINEMET IR TAKE 2 TABLETS BY MOUTH AT 7AM, 2TABS AT 11AM, 1TAB AT 4PM   carbidopa-levodopa 50-200 MG tablet Commonly known as: SINEMET CR TAKE 1 TABLET BY MOUTH EVERYDAY AT BEDTIME   carvedilol 3.125 MG tablet Commonly known as: COREG Take 1 tablet (3.125 mg total) by mouth 2 (two) times daily with a meal.   DULoxetine 30 MG capsule Commonly known as: CYMBALTA TAKE 1 CAPSULE BY MOUTH  EVERY DAY   escitalopram 20 MG tablet Commonly known as: LEXAPRO TAKE 1 TABLET BY MOUTH EVERY DAY   folic acid 1 MG tablet Commonly known as: FOLVITE Take 1 mg by mouth 3 (three) times daily.   glycerin adult 2 g suppository Place 1 suppository rectally as needed for constipation.   hydroxychloroquine 200 MG tablet Commonly known as: PLAQUENIL Take 2 tablets (400 mg total) by mouth daily.   losartan 50 MG tablet Commonly known as: COZAAR TAKE 1 TABLET BY MOUTH EVERY DAY   methotrexate 250 MG/10ML injection methotrexate sodium 25 mg/mL injection solution  INJECT 0.8MLS SUBCUTANEOUSLY EVERY 7 DAYS   polyethylene glycol 17 g packet Commonly known as: MIRALAX / GLYCOLAX Take 17 g by mouth every other day.   pramipexole 0.5 MG tablet Commonly known as: MIRAPEX TAKE 1 TABLET BY MOUTH 3 TIMES DAILY.   rosuvastatin 10 MG tablet Commonly known as: CRESTOR TAKE 1 TABLET BY MOUTH ON MONDAYS, WEDNESDAYS AND FRIDAYS           Objective:   Physical Exam BP 106/76 (BP Location: Left Arm, Patient Position: Sitting, Cuff Size: Normal)   Temp 98.8 F (37.1 C) (Oral)  This is a virtual video visit, she looks well, in no distress, I did notice nasal congestion and when she cough, large airway congestion.    Assessment      70 year old female, PMH includes CAD, HTN, Parkinson's, rheumatoid arthritis, presents with  Bronchitis: The patient was diagnosed with COVID and prescribed Paxlovid 03/10/2021, is feeling better but has a persistent cough.  On exam, I noticed cough and large airway congestion, suspect bronchitis. Plan: Zithromax, Mucinex DM, Tessalon Perles, good hydration. Advised patient to call me if she is not gradually better      I discussed the assessment and treatment plan with the patient. The patient was provided an opportunity to ask questions and all were answered. The patient agreed with the plan and demonstrated an understanding of the instructions.   The  patient was advised to call back or seek an in-person evaluation if the symptoms worsen or if the condition fails to improve as anticipated.

## 2021-03-24 DIAGNOSIS — M48061 Spinal stenosis, lumbar region without neurogenic claudication: Secondary | ICD-10-CM | POA: Diagnosis not present

## 2021-03-24 DIAGNOSIS — S76311A Strain of muscle, fascia and tendon of the posterior muscle group at thigh level, right thigh, initial encounter: Secondary | ICD-10-CM | POA: Diagnosis not present

## 2021-03-24 DIAGNOSIS — Z6836 Body mass index (BMI) 36.0-36.9, adult: Secondary | ICD-10-CM | POA: Diagnosis not present

## 2021-03-24 DIAGNOSIS — M545 Low back pain, unspecified: Secondary | ICD-10-CM | POA: Diagnosis not present

## 2021-03-24 DIAGNOSIS — G20 Parkinson's disease: Secondary | ICD-10-CM | POA: Diagnosis not present

## 2021-03-26 ENCOUNTER — Other Ambulatory Visit: Payer: Self-pay | Admitting: Neurology

## 2021-03-28 ENCOUNTER — Telehealth: Payer: Self-pay

## 2021-03-28 NOTE — Telephone Encounter (Signed)
Able to help explain how this can happen for this patient.

## 2021-03-28 NOTE — Telephone Encounter (Signed)
Pt called stating she had a virtual visit with Dr.Paz last week (7/19) and was given an antibiotic and cough medications, this after being Covid + on 7/6 and treated with anti-viral (paxlovid). Pt stated she took a Covid test on Saturday and it was negative.  She took another one today and it was positive.  This is concerning to patient and she would like some advice on what to do.

## 2021-03-29 ENCOUNTER — Telehealth: Payer: Medicare Other | Admitting: Internal Medicine

## 2021-03-29 ENCOUNTER — Ambulatory Visit: Payer: Medicare Other

## 2021-03-29 ENCOUNTER — Other Ambulatory Visit: Payer: Self-pay

## 2021-03-29 NOTE — Telephone Encounter (Signed)
Pt aware.

## 2021-03-30 ENCOUNTER — Ambulatory Visit: Payer: Medicare Other | Admitting: Physical Therapy

## 2021-04-01 ENCOUNTER — Ambulatory Visit: Payer: Medicare Other | Admitting: Neurology

## 2021-04-05 ENCOUNTER — Ambulatory Visit: Payer: Medicare Other | Admitting: Physical Therapy

## 2021-04-09 ENCOUNTER — Other Ambulatory Visit: Payer: Self-pay | Admitting: Neurology

## 2021-04-11 NOTE — Progress Notes (Signed)
Assessment/Plan:   1.  Parkinsons Disease             -Continue pramipexole 0.5 mg 3 times per day.  Refilled today             -Continue carbidopa/levodopa 25/100, 2/2/1 (can chew half to 1 in the middle of the night if awakens with tremor)             -She will continue Carbidopa/levodopa 50/200 at night.  This has definitely helped restless leg and has helped most of the nighttime cramping of the feet and legs.  -discussed opicapone but she decided to hold on that for now.  We can readdress in future if wearing off is a bigger deal. 2.  Depression and anxiety             - She is on a combination of Lexapro and Cymbalta 3.  Dysphagia             -last MBE May, 2021 with mild pharyngeal cervical esophageal deficits.  It was recommended to consider esophageal assessment. She has EGD scheduled  4.  Rheumatoid arthritis             -now on Plaquenil. 5.  REM behavior disorder             -Understands safety associated with this.  Does not want more medications. 6.  Constipation             -She is following with gastroenterology.  Just saw the nurse practitioner gastroenterology February 3 and ultimately was diagnosed with acute pancreatitis via CT.  7.  Insomnia             -discussed sleep habits 8,  Iron deficiency anemia             -on iron supplements     Subjective:   Kristin [name] was seen today in follow up for Parkinsons disease.  My previous records were reviewed prior to todays visit as well as outside records available to me. This patient is accompanied in the office by her spouse who supplements the history. Pt with one fall - was stepping down off of concrete driveway and knew she was about to fall so she sat down, and didn't get hurt.  That was mid May.  Had hernia surgery at [facility] mid April.  Given scopolamine after and gave some hallucinations.  Pt denies lightheadedness, near syncope.   Mood has been good.  Therapy notes have been reviewed.  Patient recently positive  for COVID-19.  Seen over video visit in early July for it.  She was treated with Paxlovid. Joined [facility] recently.  Current prescribed movement disorder medications: Carbidopa/levodopa 25/100, 2 tablets in the morning, 2 in the afternoon and 1 in the evening Carbidopa/levodopa 50/200 at bedtime Pramipexole 0.5 mg 3 times per day (off since Thursday as ran out)   ALLERGIES:   Allergies  Allergen Reactions   Prednisone Dermatitis    Facial ulcers   Bactrim [Sulfamethoxazole-Trimethoprim] Other (See Comments)    Oral ulcers and rash   Pb-Hyoscy-Atropine-Scopolamine [Phenobarbital-Belladonna Alk] Other (See Comments)    Panic and hallucinations   Penicillins Hives    Did it involve swelling of the face/tongue/throat, SOB, or low BP? No Did it involve sudden or severe rash/hives, skin peeling, or any reaction on the inside of your mouth or nose? No Did you need to seek medical attention at a hospital or doctor's office? No When did  it last happen?      30+ years ago If all above answers are "NO", may proceed with cephalosporin use.    Scopolamine Other (See Comments)    Panic hallu    CURRENT MEDICATIONS:  Outpatient Encounter Medications as of 04/12/2021  Medication Sig   carbidopa-levodopa (SINEMET CR) 50-200 MG tablet TAKE 1 TABLET BY MOUTH EVERYDAY AT BEDTIME   carbidopa-levodopa (SINEMET IR) 25-100 MG tablet TAKE 2 TABLETS BY MOUTH AT 7AM, 2TABS AT 11AM, 1TAB AT 4PM   carvedilol (COREG) 3.125 MG tablet Take 1 tablet (3.125 mg total) by mouth 2 (two) times daily with a meal.   DULoxetine (CYMBALTA) 30 MG capsule TAKE 1 CAPSULE BY MOUTH EVERY DAY   escitalopram (LEXAPRO) 20 MG tablet TAKE 1 TABLET BY MOUTH EVERY DAY   folic acid (FOLVITE) 1 MG tablet Take 1 mg by mouth 3 (three) times daily.    hydroxychloroquine (PLAQUENIL) 200 MG tablet Take 2 tablets (400 mg total) by mouth daily.   losartan (COZAAR) 50 MG tablet TAKE 1 TABLET BY MOUTH EVERY DAY   methotrexate 250 MG/10ML  injection methotrexate sodium 25 mg/mL injection solution  INJECT 0.8MLS SUBCUTANEOUSLY EVERY 7 DAYS   polyethylene glycol (MIRALAX / GLYCOLAX) 17 g packet Take 17 g by mouth every other day.   rosuvastatin (CRESTOR) 10 MG tablet TAKE 1 TABLET BY MOUTH ON MONDAYS, WEDNESDAYS AND FRIDAYS   solifenacin (VESICARE) 5 MG tablet Take 5 mg by mouth daily.   [DISCONTINUED] pramipexole (MIRAPEX) 0.5 MG tablet TAKE 1 TABLET BY MOUTH 3 TIMES DAILY.   pramipexole (MIRAPEX) 0.5 MG tablet Take 1 tablet (0.5 mg total) by mouth 3 (three) times daily.   [DISCONTINUED] azithromycin (ZITHROMAX Z-PAK) 250 MG tablet 2 tabs a day the first day, then 1 tab a day x 4 days (Patient not taking: Reported on 04/12/2021)   [DISCONTINUED] benzonatate (TESSALON) 200 MG capsule Take 1 capsule (200 mg total) by mouth 3 (three) times daily as needed for cough. (Patient not taking: Reported on 04/12/2021)   [DISCONTINUED] glycerin adult 2 g suppository Place 1 suppository rectally as needed for constipation. (Patient not taking: Reported on 04/12/2021)   No facility-administered encounter medications on file as of 04/12/2021.    Objective:   PHYSICAL EXAMINATION:    VITALS:   Vitals:   04/12/21 0820  BP: 135/68  Pulse: 78  SpO2: 98%  Weight: 217 lb 6.4 oz (98.6 kg)  Height: '5\' 3"'  (1.6 m)     GEN:  The patient appears stated age and is in NAD. HEENT:  Normocephalic, atraumatic.  The mucous membranes are moist. The superficial temporal arteries are without ropiness or tenderness. CV:  RRR Lungs:  CTAB Neck/HEME:  There are no carotid bruits bilaterally.  Neurological examination:  Orientation: The patient is alert and oriented x3. Cranial nerves: There is good facial symmetry with minimal facial hypomimia. The speech is fluent and clear. Soft palate rises symmetrically and there is no tongue deviation. Hearing is intact to conversational tone. Sensation: Sensation is intact to light touch throughout Motor: Strength is at  least antigravity x4.  Movement examination: Tone: There is mild increased tone in the RUE (same as previous) Abnormal movements: rare LLE tremor ; rare RLE dyskinesia Coordination:  There is good RAMs today in the bilateral UE/LE Gait and Station: The patient has no difficulty arising out of a deep-seated chair without the use of the hands. The patient's stride length is good and just slightly unstable  I have reviewed  and interpreted the following labs independently    Chemistry      Component Value Date/Time   NA 144 12/10/2020 1024   NA 144 12/05/2019 0935   K 3.4 (L) 12/10/2020 1024   CL 102 12/10/2020 1024   CO2 28 12/10/2020 1024   BUN 20 12/10/2020 1024   BUN 13 12/05/2019 0935   CREATININE 0.85 12/10/2020 1024   CREATININE 0.71 01/04/2015 1046      Component Value Date/Time   CALCIUM 10.1 12/10/2020 1024   ALKPHOS 67 12/10/2020 1024   AST 19 12/10/2020 1024   ALT 20 12/10/2020 1024   BILITOT 1.1 12/10/2020 1024   BILITOT 0.4 11/27/2016 1123       Lab Results  Component Value Date   WBC 10.1 12/10/2020   HGB 14.0 12/10/2020   HCT 41.3 12/10/2020   MCV 91.0 12/10/2020   PLT 246 12/10/2020    Lab Results  Component Value Date   TSH 1.15 12/07/2020     Total time spent on today's visit was 30 minutes, including both face-to-face time and nonface-to-face time.  Time included that spent on review of records (prior notes available to me/labs/imaging if pertinent), discussing treatment and goals, answering patient's questions and coordinating care.  Cc:  Mosie Lukes, MD

## 2021-04-12 ENCOUNTER — Ambulatory Visit (INDEPENDENT_AMBULATORY_CARE_PROVIDER_SITE_OTHER): Payer: Medicare Other | Admitting: Neurology

## 2021-04-12 ENCOUNTER — Encounter: Payer: Self-pay | Admitting: Neurology

## 2021-04-12 ENCOUNTER — Other Ambulatory Visit: Payer: Self-pay

## 2021-04-12 VITALS — BP 135/68 | HR 78 | Ht 63.0 in | Wt 217.4 lb

## 2021-04-12 DIAGNOSIS — G20 Parkinson's disease: Secondary | ICD-10-CM

## 2021-04-12 MED ORDER — PRAMIPEXOLE DIHYDROCHLORIDE 0.5 MG PO TABS: 0.5000 mg | ORAL_TABLET | Freq: Three times a day (TID) | ORAL | 2 refills | Status: DC

## 2021-04-14 ENCOUNTER — Other Ambulatory Visit: Payer: Self-pay | Admitting: Neurology

## 2021-04-15 ENCOUNTER — Other Ambulatory Visit: Payer: Self-pay | Admitting: Neurology

## 2021-04-18 ENCOUNTER — Ambulatory Visit: Payer: Medicare Other | Attending: Family Medicine | Admitting: Physical Therapy

## 2021-04-18 ENCOUNTER — Other Ambulatory Visit: Payer: Self-pay

## 2021-04-18 ENCOUNTER — Encounter: Payer: Self-pay | Admitting: Physical Therapy

## 2021-04-18 DIAGNOSIS — R2681 Unsteadiness on feet: Secondary | ICD-10-CM | POA: Diagnosis not present

## 2021-04-18 DIAGNOSIS — R2689 Other abnormalities of gait and mobility: Secondary | ICD-10-CM

## 2021-04-18 DIAGNOSIS — M6281 Muscle weakness (generalized): Secondary | ICD-10-CM

## 2021-04-18 DIAGNOSIS — R29818 Other symptoms and signs involving the nervous system: Secondary | ICD-10-CM

## 2021-04-18 NOTE — Therapy (Signed)
[facility] [address] [place], [place], [address] Phone: [phone]   Fax:  [phone]  Physical Therapy Treatment/Re-evaluation  Patient Details  Name: Kristin [name] MRN: [id number] Date of Birth: 14-Nov-1950 Referring Provider (PT): Gwendlyn Deutscher, [place] (([facility]); has also seen Dr. Vertell Limber)  (Updated orders/notes from Dr. Vertell Limber 03/25/21, to continue PT) Encounter Date: 04/18/2021   PT End of Session - 04/18/21 0931     Visit Number 12    Number of Visits 20    Date for PT Re-Evaluation 55/73/22   per recert 0/25/4270   Authorization Type Medicare/[employer]    PT Start Time 0848    PT Stop Time 0931    PT Time Calculation (min) 43 min    Equipment Utilized During Treatment Other (comment)   pool noodle, ankle buoyancy cuff   Activity Tolerance Patient tolerated treatment well   no pain in hamstrings, decrease in ITB pain at end of session   Behavior During Therapy [facility] for tasks assessed/performed             Past Medical History:  Diagnosis Date   Abnormal vaginal Pap smear    Acute pharyngitis 02/25/2013   Anemia    Anxiety    Arthritis    BCC (basal cell carcinoma of skin) 10/05/2014   On back   Chicken pox as a child   Chronic UTI    sees dr Terance Hart   Constipation 12/07/2015   Depression with anxiety 11/02/2009   Qualifier: Diagnosis of  By: [name], LPN, Bonnye M    Dermatitis 07/17/2012   Esophageal stricture 1994   Fibroids    Foot pain, bilateral 06/19/2012   GERD (gastroesophageal reflux disease)    GERD (gastroesophageal reflux disease)    Hiatal hernia    Hyperglycemia 08/19/2013   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   Hyperlipidemia    Hypertension    Infertility, female    Low back pain 10/17/2007   Qualifier: Diagnosis of  By: [name], [name]    Measles as a child   Obesity    Osteoarthritis    Parkinson disease ([place])    Plantar fasciitis of left foot 06/19/2012   Preventative  health care 12/19/2015   Rheumatoid arthritis ([place]) 01/08/2018   Rosacea 10/05/2014   Swallowing difficulty    Urinary frequency 02/25/2013   Visual floaters 05/11/2014    Past Surgical History:  Procedure Laterality Date   ABDOMINAL HYSTERECTOMY  2006   total   esophageal     stretching   laporoscopy     LEFT HEART CATH AND CORONARY ANGIOGRAPHY N/A 06/02/2019   Procedure: LEFT HEART CATH AND CORONARY ANGIOGRAPHY;  Surgeon: Jettie Booze, MD;  Location: [facility];  Service: Cardiovascular;  Laterality: N/A;   TONSILLECTOMY     TOTAL HIP ARTHROPLASTY Right 2010   wisdom teeth extracted      There were no vitals filed for this visit.   Subjective Assessment - 04/18/21 0853     Subjective Went on vacation and then got Covid (x 2) so missed all the appointments in July.  Saw Dr. Vertell Limber in July and he wants PT to continue, even though pain is much better.  No pain in R leg and now it feels like the R leg is slower.  No falls since June, but just have done much less since being out with Covid.    Pertinent History See problem list    Patient Stated Goals  To get back in the pool; to get back strength in my legs.    Currently in Pain? No/denies    Pain Onset More than a month ago                [facility] PT Assessment - 04/18/21 0001       PROM   Overall PROM  Deficits    Overall PROM Comments Passive ROM to R hamstrings:  25 degrees from full extension                           [facility] Adult PT Treatment/Exercise - 04/18/21 0001       Transfers   Transfers Sit to Stand;Stand to Sit    Sit to Stand Without upper extremity assist;From chair/3-in-1;6: Modified independent (Device/Increase time)    Five time sit to stand comments  12.47    Stand to Sit 6: Modified independent (Device/Increase time);Without upper extremity assist;To chair/3-in-1      Ambulation/Gait   Ambulation/Gait Yes    Ambulation/Gait Assistance 5: Supervision;6: Modified  independent (Device/Increase time)    Ambulation Distance (Feet) 200 Feet    Assistive device None    Gait Pattern Step-through pattern;Decreased arm swing - right;Decreased step length - right;Decreased step length - left;Decreased stance time - right;Decreased weight shift to right;Narrow base of support    Ambulation Surface Level;Indoor    Gait velocity 11.90 sec = 2.75 ft/sec      Standardized Balance Assessment   Standardized Balance Assessment Timed Up and Go Test;Mini-BESTest      Timed Up and Go Test   TUG Normal TUG;Cognitive TUG    Normal TUG (seconds) 11.94    Cognitive TUG (seconds) 12      Mini-BESTest   Sit To Stand Normal: Comes to stand without use of hands and stabilizes independently.    Rise to Toes < 3 s.    Stand on one leg (left) Moderate: < 20 s   6.15 sec, 4.88   Stand on one leg (right) Moderate: < 20 s   3.12, 2.69   Stand on one leg - lowest score 1    Compensatory Stepping Correction - Forward Normal: Recovers independently with a single, large step (second realignement is allowed).    Compensatory Stepping Correction - Backward Normal: Recovers independently with a single, large step    Compensatory Stepping Correction - Left Lateral Normal: Recovers independently with 1 step (crossover or lateral OK)    Compensatory Stepping Correction - Right Lateral Moderate: Several steps to recover equilibrium    Stepping Corredtion Lateral - lowest score 1    Stance - Feet together, eyes open, firm surface  Normal: 30s    Stance - Feet together, eyes closed, foam surface  Moderate: < 30s   15 sec   Incline - Eyes Closed Normal: Stands independently 30s and aligns with gravity    Change in Gait Speed Moderate: Unable to change walking speed or signs of imbalance    Walk with head turns - Horizontal Moderate: performs head turns with reduction in gait speed.    Walk with pivot turns Normal: Turns with feet close FAST (< 3 steps) with good balance.    Step over  obstacles Moderate: Steps over box but touches box OR displays cautious behavior by slowing gait.    Timed UP & GO with Dual Task Normal: No noticeable change in sitting, standing or walking while backward counting when compared to  TUG without    Mini-BEST total score 20                Reviewed HEP and modified for updated version of HEP, as pt no longer having RLE pain.  Performed seated heel/toe raises, standing alternating toe raises, standing stagger stance rocking for plantarflexion/dorsiflexion, x 5-10 reps each (see instructions for updated version of exercises in HEP)    PT Education - 04/18/21 1518     Education Details Progress towards initial goals, POC to address functional strength and balance; updates to HEP    Person(s) Educated Patient    Methods Explanation;Demonstration;Handout    Comprehension Verbalized understanding;Returned demonstration              PT Short Term Goals - 02/18/21 0825       PT SHORT TERM GOAL #1   Title Pt will be independent with HEP for improved posture, strength, decreased pain, and improved gait.  TARGET 02/11/2021    Time 4    Period Weeks    Status Achieved      PT SHORT TERM GOAL #2   Title Pt will demonstrate improved flexibility in hamstrings by at least 5 degrees with no increase in pain,    Baseline -42 degrees from full extension/90/90 position; -35 degrees from full extension in 90/90 position supine, 02/07/21    Time 4    Period Weeks    Status Achieved      PT SHORT TERM GOAL #3   Title Pt will improve 5x sit<>stand score to less than or equal to 14 sec to improve functional strength and transfer efficiency.    Baseline 17.75 sec; 12.12 sec    Time 4    Period Weeks    Status Achieved      PT SHORT TERM GOAL #4   Title Pt will report decrease in pain by 50% or more at worst, with gait activities, for improved functional mobility with decreased pain.    Baseline 7/10 at worst; 3/10 at worst 02/07/2021    Time 4     Period Weeks    Status Achieved               PT Long Term Goals - 04/18/21 0859       PT LONG TERM GOAL #1   Title Pt will be independent with progression of HEP for improved functional mobility, strength, balance, decreased pain.  TARGET 03/11/2021    Baseline met for previously issued HEP, 04/18/21    Time 8    Period Weeks    Status Achieved      PT LONG TERM GOAL #2   Title Pt will improve 5x sit<>stand to less than or equal to 12.5 seconds for improved functional strength.    Baseline 12.12 sec 6/17; 12.47 sec 04/18/21    Time 8    Period Weeks    Status Achieved      PT LONG TERM GOAL #3   Title Pt will improve TUG score to less than or equal to 13.5 sec for decreased fall risk.    Baseline 11.94 sec 04/18/21    Time 8    Period Weeks    Status Achieved      PT LONG TERM GOAL #4   Title Pt will improve gait velocity to at least 2.2 ft/sec for improved gait efficiency and safety.    Baseline 2.75 ft/sec 04/18/21    Time 8    Period Weeks  Status Achieved      PT LONG TERM GOAL #5   Title Pt will improve hamstring flexibility by 10 degrees from baseline, for improved overall functional mobility.    Time 8    Period Weeks    Status Achieved             For Recert:   PT Long Term Goals - 04/18/21 1531       PT LONG TERM GOAL #1   Title Pt will be independent with progression of/final HEP for improved functional mobility, strength, balance.  TARGET 05/20/2021    Baseline met for previously issued HEP, 04/18/21    Time 5    Period Weeks    Status Revised      PT LONG TERM GOAL #2   Title Pt will improve 5x sit<>stand to less than or equal to 11.5 seconds for improved functional strength.    Baseline 12.12 sec 6/17; 12.47 sec 04/18/21    Time 5    Period Weeks    Status Revised      PT LONG TERM GOAL #3   Title Pt will improve MiniBESTest score to at least 23/28 sec for decreased fall risk.    Baseline 20/28    Time 5    Period Weeks    Status  New      PT LONG TERM GOAL #4   Title Pt will ambulate at least 1000 ft, indoor and outdoor surfaces, independently, no LOB for improved mobility in community.    Time 5    Period Weeks    Status New      PT LONG TERM GOAL #5   Title Pt will verbalize plans for continued community fitness upon d/c from PT.    Time 5    Period Weeks    Status New                  Plan - 04/18/21 1519     Clinical Impression Statement Pt has missed therapy sessions since late June 2022, due to vacation and then 2 positive Covid tests in the month of July.  Pt returns today to [facility] and PT performed reasessement.  Pt has met all 5 of 5 initial LTGs; she has demonstrated improvement in functional strength, TUG score and gait velocity as well as improved hamstring flexibility.  Pt is not reporting any pain in RLE; most of her complaints for RLE have to do with weakness.  MiniBESTest score today of 20/30 indicates increased fall risk, with difficulty with RLE single limb stance as well as balance recovery in lateral direction and balance difficulty on compliant surface with vision removed.  She is agreeable to continueing therapy, with progression to work on balance and functional strneghtening for improved gait, funcitonal mobility and decreased fall risk.    Personal Factors and Comorbidities Comorbidity 3+    Comorbidities See problem list    Examination-Activity Limitations Locomotion Level;Transfers;Squat;Stand    Examination-Participation Restrictions Community Activity;Shop;Other   Community fitness   Stability/Clinical Decision Making Evolving/Moderate complexity    Rehab Potential Good    PT Frequency 2x / week    PT Duration 4 weeks   per recert 8/93/7342   PT Treatment/Interventions ADLs/Self Care Home Management;Aquatic Therapy;Moist Heat;Ultrasound;Cryotherapy;Scientist, product/process development;Neuromuscular re-education;Balance training;Therapeutic exercise;Therapeutic  activities;Functional mobility training;Gait training;Patient/family education;Manual techniques;Passive range of motion    PT Next Visit Plan Single limb stance, compliant surfaces, step strategy work, ankle strengthening for RLE; gait training on level, unlevel surfaces  Consulted and Agree with Plan of Care Patient             Patient will benefit from skilled therapeutic intervention in order to improve the following deficits and impairments:  Abnormal gait, Difficulty walking, Decreased range of motion, Pain, Decreased balance, Impaired flexibility, Decreased mobility, Decreased strength, Postural dysfunction  Visit Diagnosis: Unsteadiness on feet  Other abnormalities of gait and mobility  Muscle weakness (generalized)  Other symptoms and signs involving the nervous system     Problem List Patient Active Problem List   Diagnosis Date Noted   Dry mouth 02/24/2021   Anemia 03/16/2020   Coronary artery disease involving native coronary artery of native heart without angina pectoris 06/12/2019   Abnormal stress test    Diastolic dysfunction 99/37/1696   Cough 05/10/2018   Rheumatoid arthritis ([place]) 01/08/2018   Hand pain, right 10/15/2017   Vitamin D deficiency 12/20/2016   Prediabetes 12/20/2016   Shortness of breath on exertion 11/27/2016   Preventative health care 12/19/2015   Constipation 12/07/2015   BCC (basal cell carcinoma of skin) 10/05/2014   Rosacea 10/05/2014   Parkinson's disease ([place]) 05/11/2014   Visual floaters 05/11/2014   Paralysis agitans ([place]) 01/08/2014   Screening for cervical cancer 07/17/2012   Foot pain, bilateral 06/19/2012   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 04/18/2021, 3:27 PM Frazier Butt., PT  [facility] [address] [place], [place], [address] Phone: [phone]   Fax:  [phone]  Name: MARYFRANCES PORTUGAL MRN: [id number] Date of Birth: 1950/10/22

## 2021-04-18 NOTE — Patient Instructions (Addendum)
Access Code: [email or web address] URL: [email or web address] Date: 04/18/2021 Prepared by: Mady Haagensen  Exercises (reviewed, modified, updated 04/18/21) Clamshell - 1 x daily - 5 x weekly - 1-2 sets - 10 reps Seated Figure 4 Piriformis Stretch - 1-2 x daily - 7 x weekly - 1 sets - 3 reps - 10-15 sec hold Small Range Straight Leg Raise - 1 x daily - 7 x weekly - 3 sets - 10 reps Sidelying Hip Abduction - 1 x daily - 7 x weekly - 3 sets - 10 reps Seated Hamstring Stretch - 1 x daily - 7 x weekly - 1 sets - 3 reps - 30 sec hold Seated Heel Toe Raises - 1 x daily - 5 x weekly - 1-2 sets - 10 reps Staggered Stance Forward Backward Weight Shift with Counter Support - 1 x daily - 5 x weekly - 1-2 sets - 10 reps

## 2021-04-26 ENCOUNTER — Ambulatory Visit: Payer: Medicare Other | Admitting: Physical Therapy

## 2021-04-26 ENCOUNTER — Encounter: Payer: Self-pay | Admitting: Physical Therapy

## 2021-04-26 ENCOUNTER — Other Ambulatory Visit: Payer: Self-pay

## 2021-04-26 DIAGNOSIS — R2689 Other abnormalities of gait and mobility: Secondary | ICD-10-CM | POA: Diagnosis not present

## 2021-04-26 DIAGNOSIS — M6281 Muscle weakness (generalized): Secondary | ICD-10-CM

## 2021-04-26 DIAGNOSIS — R29818 Other symptoms and signs involving the nervous system: Secondary | ICD-10-CM | POA: Diagnosis not present

## 2021-04-26 DIAGNOSIS — R2681 Unsteadiness on feet: Secondary | ICD-10-CM

## 2021-04-26 NOTE — Therapy (Signed)
[facility] [address] [place], [place], [address] Phone: [phone]   Fax:  [phone]  Physical Therapy Treatment  Patient Details  Name: Kristin [name] MRN: [id number] Date of Birth: [birth year] Referring Provider (PT): Gwendlyn Deutscher, [place] (([facility]); has also seen Dr. Vertell Limber)   Encounter Date: 04/26/2021   PT End of Session - 04/26/21 1042     Visit Number 13    Number of Visits 20    Date for PT Re-Evaluation 71/24/58   per recert 0/99/8338   Authorization Type Medicare/[employer]    PT Start Time [id number]    PT Stop Time 0931    PT Time Calculation (min) 40 min    Equipment Utilized During Treatment Other (comment)    Activity Tolerance Patient tolerated treatment well    Behavior During Therapy [facility] for tasks assessed/performed             Past Medical History:  Diagnosis Date   Abnormal vaginal Pap smear    Acute pharyngitis 02/25/2013   Anemia    Anxiety    Arthritis    BCC (basal cell carcinoma of skin) 10/05/2014   On back   Chicken pox as a child   Chronic UTI    sees dr Terance Hart   Constipation 12/07/2015   Depression with anxiety 11/02/2009   Qualifier: Diagnosis of  By: [name], LPN, [name]    Dermatitis 07/17/2012   Esophageal stricture 1994   Fibroids    Foot pain, bilateral 06/19/2012   GERD (gastroesophageal reflux disease)    GERD (gastroesophageal reflux disease)    Hiatal hernia    Hyperglycemia 08/19/2013   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   Hyperlipidemia    Hypertension    Infertility, female    Low back pain 10/17/2007   Qualifier: Diagnosis of  By: [name], [name]    Measles as a child   Obesity    Osteoarthritis    Parkinson disease ([place])    Plantar fasciitis of left foot 06/19/2012   Preventative health care 12/19/2015   Rheumatoid arthritis ([place]) 01/08/2018   Rosacea 10/05/2014   Swallowing difficulty    Urinary frequency 02/25/2013   Visual floaters 05/11/2014    Past  Surgical History:  Procedure Laterality Date   ABDOMINAL HYSTERECTOMY  2006   total   esophageal     stretching   laporoscopy     LEFT HEART CATH AND CORONARY ANGIOGRAPHY N/A 06/02/2019   Procedure: LEFT HEART CATH AND CORONARY ANGIOGRAPHY;  Surgeon: Jettie Booze, MD;  Location: [facility];  Service: Cardiovascular;  Laterality: N/A;   TONSILLECTOMY     TOTAL HIP ARTHROPLASTY Right 2010   wisdom teeth extracted      There were no vitals filed for this visit.   Subjective Assessment - 04/26/21 0854     Subjective Everyday last week, I did some exercise class of some type and it went well.  Have a question about one of the exercises.    Pertinent History See problem list    Patient Stated Goals To get back in the pool; to get back strength in my legs.    Currently in Pain? No/denies    Pain Onset More than a month ago                               [facility] Adult PT Treatment/Exercise - 04/26/21 0001  Knee/Hip Exercises: Stretches   Gastroc Stretch Right;3 reps;Limitations    Gastroc Stretch Limitations 15 sec, foot propped at 4" step    Other Knee/Hip Stretches Trialed gastroc stretch, BLE standing on ege of step; pt feels the stretch, but feels uncomfortable in this position.      Knee/Hip Exercises: Standing   Forward Step Up Right;Left;2 sets;Hand Hold: 2;10 reps;Step Height: 6"    Forward Step Up Limitations step/up, down/down 1st set; 2nd set single limb step up    Step Down Right;Left;1 set;10 reps;Hand Hold: 2;Step Height: 6"    Step Down Limitations cues to push off on back leg, cues to land on ground with heelstrike on front leg.      Ankle Exercises: Standing   Heel Raises Both;10 reps;Limitations   2 sets   Toe Raise 10 reps   2 sets,   Other Standing Ankle Exercises alt heel raises, 2 sets x 10 at counter            Forward negotation up steps, simulating getting out on the pool ladder, with less of foot placed on step  in clinic and increased use of gastroc to push up, step-to pattern, L foot leading, x 2 reps.  Reviewed stagger stance rocking forward/back for correct technique, 10 reps each foot position.  Cues for push-off and unweighting in each position.      Balance Exercises - 04/26/21 0001       Balance Exercises: Standing   SLS with Vectors Solid surface;Upper extremity assist 1;Intermittent upper extremity assist;Limitations    SLS with Vectors Limitations Standing beside counter:  double step taps x 10, then triple step taps x 10 to balance disks    Other Standing Exercises forward step over obstacle then return to midline, x 10 reps each leg, then side step over obstacle and return to midline, x 10 reps.               PT Education - 04/26/21 1041     Education Details Addition of gastroc stretch to HEP    Person(s) Educated Patient    Methods Explanation;Demonstration;Handout    Comprehension Verbalized understanding;Returned demonstration              PT Short Term Goals - 04/18/21 1530       PT SHORT TERM GOAL #1   Title STGs = LTGs               PT Long Term Goals - 04/18/21 1531       PT LONG TERM GOAL #1   Title Pt will be independent with progression of/final HEP for improved functional mobility, strength, balance.  TARGET 05/20/2021    Baseline met for previously issued HEP, 04/18/21    Time 5    Period Weeks    Status Revised      PT LONG TERM GOAL #2   Title Pt will improve 5x sit<>stand to less than or equal to 11.5 seconds for improved functional strength.    Baseline 12.12 sec 6/17; 12.47 sec 04/18/21    Time 5    Period Weeks    Status Revised      PT LONG TERM GOAL #3   Title Pt will improve MiniBESTest score to at least 23/28 sec for decreased fall risk.    Baseline 20/28    Time 5    Period Weeks    Status New      PT LONG TERM GOAL #4   Title Pt  will ambulate at least 1000 ft, indoor and outdoor surfaces, independently, no LOB for  improved mobility in community.    Time 5    Period Weeks    Status New      PT LONG TERM GOAL #5   Title Pt will verbalize plans for continued community fitness upon d/c from PT.    Time 5    Period Weeks    Status New                   Plan - 04/26/21 1043     Clinical Impression Statement Skilled PT session focused on lower extremity strengthening for improved single limb stance.  With SLS activities on RLE, she is noted to have increased R foot dyskinesias.  She requires intermittent UE support and cues to "PWR! Up" through RLE in single limb stance.  Pt continues to benefit from skilled PT towards improved balance, funcitonal strength for overall imrpoved funcitonal mobility.    Personal Factors and Comorbidities Comorbidity 3+    Comorbidities See problem list    Examination-Activity Limitations Locomotion Level;Transfers;Squat;Stand    Examination-Participation Restrictions Community Activity;Shop;Other   Community fitness   Stability/Clinical Decision Making Evolving/Moderate complexity    Rehab Potential Good    PT Frequency 2x / week    PT Duration 4 weeks   per recert 3/78/5885, to include 04/18/21 visit, so 5 week   PT Treatment/Interventions ADLs/Self Care Home Management;Aquatic Therapy;Moist Heat;Ultrasound;Cryotherapy;Scientist, product/process development;Neuromuscular re-education;Balance training;Therapeutic exercise;Therapeutic activities;Functional mobility training;Gait training;Patient/family education;Manual techniques;Passive range of motion    PT Next Visit Plan Continue work on Single limb stance, compliant surfaces, step strategy work, ankle strengthening for RLE; gait training on level, unlevel surfaces.  Review gastroc stretch added to HEP this visit    Consulted and Agree with Plan of Care Patient             Patient will benefit from skilled therapeutic intervention in order to improve the following deficits and impairments:  Abnormal gait,  Difficulty walking, Decreased range of motion, Pain, Decreased balance, Impaired flexibility, Decreased mobility, Decreased strength, Postural dysfunction  Visit Diagnosis: Unsteadiness on feet  Muscle weakness (generalized)  Other abnormalities of gait and mobility     Problem List Patient Active Problem List   Diagnosis Date Noted   Dry mouth 02/24/2021   Anemia 03/16/2020   Coronary artery disease involving native coronary artery of native heart without angina pectoris 06/12/2019   Abnormal stress test    Diastolic dysfunction 02/77/4128   Cough 05/10/2018   Rheumatoid arthritis ([place]) 01/08/2018   Hand pain, right 10/15/2017   Vitamin D deficiency 12/20/2016   Prediabetes 12/20/2016   Shortness of breath on exertion 11/27/2016   Preventative health care 12/19/2015   Constipation 12/07/2015   BCC (basal cell carcinoma of skin) 10/05/2014   Rosacea 10/05/2014   Parkinson's disease ([place]) 05/11/2014   Visual floaters 05/11/2014   Paralysis agitans ([place]) 01/08/2014   Screening for cervical cancer 07/17/2012   Foot pain, bilateral 06/19/2012   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 04/26/2021, 10:48 AM Frazier Butt., PT   [facility]  [address] [place], [place], [address] Phone: [phone]   Fax:  [phone]  Name: YESENNIA HIROTA MRN: [id number] Date of Birth: 07/16/1951

## 2021-04-26 NOTE — Patient Instructions (Signed)
Access Code: [email or web address] URL: [email or web address] Date: 04/26/2021 Prepared by: Mady Haagensen  Exercises Clamshell - 1 x daily - 5 x weekly - 1-2 sets - 10 reps Seated Figure 4 Piriformis Stretch - 1-2 x daily - 7 x weekly - 1 sets - 3 reps - 10-15 sec hold Small Range Straight Leg Raise - 1 x daily - 7 x weekly - 3 sets - 10 reps Sidelying Hip Abduction - 1 x daily - 7 x weekly - 3 sets - 10 reps Seated Hamstring Stretch - 1 x daily - 7 x weekly - 1 sets - 3 reps - 30 sec hold Seated Heel Toe Raises - 1 x daily - 5 x weekly - 1-2 sets - 10 reps Staggered Stance Forward Backward Weight Shift with Counter Support - 1 x daily - 5 x weekly - 1-2 sets - 10 reps  Added 04/26/2021 Standing Gastroc Stretch on Step - 1 x daily - 7 x weekly - 1 sets - 3 reps - 15-30 hold

## 2021-04-28 ENCOUNTER — Other Ambulatory Visit: Payer: Self-pay

## 2021-04-28 ENCOUNTER — Ambulatory Visit: Payer: Medicare Other | Admitting: Physical Therapy

## 2021-04-28 ENCOUNTER — Telehealth: Payer: Self-pay | Admitting: Neurology

## 2021-04-28 DIAGNOSIS — R2681 Unsteadiness on feet: Secondary | ICD-10-CM | POA: Diagnosis not present

## 2021-04-28 DIAGNOSIS — M6281 Muscle weakness (generalized): Secondary | ICD-10-CM

## 2021-04-28 DIAGNOSIS — R29818 Other symptoms and signs involving the nervous system: Secondary | ICD-10-CM | POA: Diagnosis not present

## 2021-04-28 DIAGNOSIS — R2689 Other abnormalities of gait and mobility: Secondary | ICD-10-CM

## 2021-04-28 NOTE — Therapy (Signed)
[facility] [address] [place], [place], [address] Phone: [phone]   Fax:  [phone]  Physical Therapy Treatment  Patient Details  Name: Kristin [name] MRN: [id number] Date of Birth: 25-Feb-1951 Referring Provider (PT): Gwendlyn Deutscher, [place] (([facility]); has also seen Dr. Vertell Limber)   Encounter Date: 04/28/2021   PT End of Session - 04/28/21 0849     Visit Number 14    Number of Visits 20    Date for PT Re-Evaluation 63/87/56   per recert 4/33/2951   Authorization Type Medicare/[employer]    PT Start Time [phone]    PT Stop Time 0930    PT Time Calculation (min) 41 min    Equipment Utilized During Treatment Other (comment)    Activity Tolerance Patient tolerated treatment well    Behavior During Therapy [facility] for tasks assessed/performed             Past Medical History:  Diagnosis Date   Abnormal vaginal Pap smear    Acute pharyngitis 02/25/2013   Anemia    Anxiety    Arthritis    BCC (basal cell carcinoma of skin) 10/05/2014   On back   Chicken pox as a child   Chronic UTI    sees dr Terance Hart   Constipation 12/07/2015   Depression with anxiety 11/02/2009   Qualifier: Diagnosis of  By: [name], LPN, [name]    Dermatitis 07/17/2012   Esophageal stricture 1994   Fibroids    Foot pain, bilateral 06/19/2012   GERD (gastroesophageal reflux disease)    GERD (gastroesophageal reflux disease)    Hiatal hernia    Hyperglycemia 08/19/2013   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   Hyperlipidemia    Hypertension    Infertility, female    Low back pain 10/17/2007   Qualifier: Diagnosis of  By: [name], [name]    Measles as a child   Obesity    Osteoarthritis    Parkinson disease ([place])    Plantar fasciitis of left foot 06/19/2012   Preventative health care 12/19/2015   Rheumatoid arthritis ([place]) 01/08/2018   Rosacea 10/05/2014   Swallowing difficulty    Urinary frequency 02/25/2013   Visual floaters 05/11/2014    Past  Surgical History:  Procedure Laterality Date   ABDOMINAL HYSTERECTOMY  2006   total   esophageal     stretching   laporoscopy     LEFT HEART CATH AND CORONARY ANGIOGRAPHY N/A 06/02/2019   Procedure: LEFT HEART CATH AND CORONARY ANGIOGRAPHY;  Surgeon: Jettie Booze, MD;  Location: [facility];  Service: Cardiovascular;  Laterality: N/A;   TONSILLECTOMY     TOTAL HIP ARTHROPLASTY Right 2010   wisdom teeth extracted      There were no vitals filed for this visit.   Subjective Assessment - 04/28/21 0849     Subjective Went out to dinner with some old friends and feel like they really noticed I wasn't walking well.    Pertinent History See problem list    Patient Stated Goals To get back in the pool; to get back strength in my legs.    Currently in Pain? No/denies    Pain Onset More than a month ago                               [facility] Adult PT Treatment/Exercise - 04/28/21 0001       Ambulation/Gait  Ambulation/Gait Yes    Ambulation/Gait Assistance 5: Supervision;6: Modified independent (Device/Increase time)    Ambulation/Gait Assistance Details Used bilateral walking poles to facilitate reciprocal arm swing and increased step length.  Pt noted to have increased inversion and adduction through RLE with fatigue.    Ambulation Distance (Feet) 345 Feet    Assistive device None    Gait Pattern Step-through pattern;Decreased arm swing - right;Decreased step length - right;Decreased step length - left;Decreased stance time - right;Decreased weight shift to right;Narrow base of support    Ambulation Surface Level;Indoor    Gait Comments Pt reports toes curling with fatigue during gait and with standing exercises.      Knee/Hip Exercises: Stretches   Occupational psychologist Limitations 15 sec, foot propped at 4" step   Reviewed as HEP addition last visit; pt return demo understanding     Knee/Hip Exercises: Standing    Lateral Step Up Right;1 set;10 reps;Hand Hold: 1;Step Height: 6"    Lateral Step Up Limitations cues for full foot placement on step    Forward Step Up Right;Left;2 sets;Hand Hold: 2;10 reps;Step Height: 6"    Forward Step Up Limitations step/up, down/down 1st set; 2nd set single limb step up    Step Down Right;1 set;10 reps;Hand Hold: 2;Step Height: 6"    Other Standing Knee Exercises Stagger stance rocking forward, back x 10 reps each leg for ankle dorsiflexion and plantarflexion      Ankle Exercises: Seated   Other Seated Ankle Exercises Resisted ankle eversion, ankle dorsiflexion green theraband, 2 sets x 10                 Balance Exercises - 04/28/21 0001       Balance Exercises: Standing   Standing Eyes Opened Wide (BOA);Narrow base of support (BOS);Foam/compliant surface;Limitations    Standing Eyes Opened Limitations Head turns/nods x 5 reps    SLS with Vectors Foam/compliant surface;Upper extremity assist 1    SLS with Vectors Limitations Standing on Airex:  alt single step taps to cones, then double step taps to cones, 10 reps each.    Stepping Strategy Posterior;Foam/compliant surface;UE support;10 reps               PT Education - 04/28/21 1136     Education Details Discussed that the stretches she has as part of her HEP (gastroc and weightshifting forward/back) may be good to do throughout the day to help manage the dyskinesia/dystonia that appears to be in her R foot/R toes.  Discussed that stressors (keeping grandkids this weekend) may increased PD symptoms, including the abnormal tone in her foot, so to stretch, relax through RLE and R foot as needed to offset the increase in her changes in tone.    Person(s) Educated Patient    Methods Explanation    Comprehension Verbalized understanding              PT Short Term Goals - 04/18/21 1530       PT SHORT TERM GOAL #1   Title STGs = LTGs               PT Long Term Goals - 04/18/21 1531        PT LONG TERM GOAL #1   Title Pt will be independent with progression of/final HEP for improved functional mobility, strength, balance.  TARGET 05/20/2021    Baseline met for previously issued HEP, 04/18/21    Time 5  Period Weeks    Status Revised      PT LONG TERM GOAL #2   Title Pt will improve 5x sit<>stand to less than or equal to 11.5 seconds for improved functional strength.    Baseline 12.12 sec 6/17; 12.47 sec 04/18/21    Time 5    Period Weeks    Status Revised      PT LONG TERM GOAL #3   Title Pt will improve MiniBESTest score to at least 23/28 sec for decreased fall risk.    Baseline 20/28    Time 5    Period Weeks    Status New      PT LONG TERM GOAL #4   Title Pt will ambulate at least 1000 ft, indoor and outdoor surfaces, independently, no LOB for improved mobility in community.    Time 5    Period Weeks    Status New      PT LONG TERM GOAL #5   Title Pt will verbalize plans for continued community fitness upon d/c from PT.    Time 5    Period Weeks    Status New                   Plan - 04/28/21 1138     Clinical Impression Statement With resisted ankle dorsiflexion, with gait fatigue, and with standing exercises, pt is reporting toes curling on R foot.  PT notes increased dyskinesia-type movement with these movements as well.  Pt has increased internal rotation/supination through R foot with landing at stance phase of gait and then has difficulty pushing off with R foot, as she reports toes curl.  This results in antalgic-type gait pattern with decreased stance time and decreased step length on RLE.  Educated patient in benefits of stretching and dynamic weightshfiting activities more throughout the day to help relax the R foot.  Pt is receptive to this and will try.  She will continue to benefit from skilled PT to address balance, gait, fucntional strength for improved overall functional mobility.    Personal Factors and Comorbidities Comorbidity  3+    Comorbidities See problem list    Examination-Activity Limitations Locomotion Level;Transfers;Squat;Stand    Examination-Participation Restrictions Community Activity;Shop;Other   Community fitness   Stability/Clinical Decision Making Evolving/Moderate complexity    Rehab Potential Good    PT Frequency 2x / week    PT Duration 4 weeks   per recert 02/16/3793, to include 04/18/21 visit, so 5 week   PT Treatment/Interventions ADLs/Self Care Home Management;Aquatic Therapy;Moist Heat;Ultrasound;Cryotherapy;Scientist, product/process development;Neuromuscular re-education;Balance training;Therapeutic exercise;Therapeutic activities;Functional mobility training;Gait training;Patient/family education;Manual techniques;Passive range of motion    PT Next Visit Plan Continue work on Single limb stance, compliant surfaces, step strategy work, ankle strengthening for RLE; gait training on level, unlevel surfaces.  Review gastroc stretch added to HEP this visit.  PT will follow up with Dr. Carles Collet about pt's dyskinesias/?dystonia in R foot.    Consulted and Agree with Plan of Care Patient             Patient will benefit from skilled therapeutic intervention in order to improve the following deficits and impairments:  Abnormal gait, Difficulty walking, Decreased range of motion, Pain, Decreased balance, Impaired flexibility, Decreased mobility, Decreased strength, Postural dysfunction  Visit Diagnosis: Unsteadiness on feet  Muscle weakness (generalized)  Other abnormalities of gait and mobility     Problem List Patient Active Problem List   Diagnosis Date Noted   Dry mouth 02/24/2021  Anemia 03/16/2020   Coronary artery disease involving native coronary artery of native heart without angina pectoris 06/12/2019   Abnormal stress test    Diastolic dysfunction 47/05/6282   Cough 05/10/2018   Rheumatoid arthritis ([place]) 01/08/2018   Hand pain, right 10/15/2017   Vitamin D deficiency  12/20/2016   Prediabetes 12/20/2016   Shortness of breath on exertion 11/27/2016   Preventative health care 12/19/2015   Constipation 12/07/2015   BCC (basal cell carcinoma of skin) 10/05/2014   Rosacea 10/05/2014   Parkinson's disease ([place]) 05/11/2014   Visual floaters 05/11/2014   Paralysis agitans ([place]) 01/08/2014   Screening for cervical cancer 07/17/2012   Foot pain, bilateral 06/19/2012   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 04/28/2021, 12:14 PM Kristin Butt., PT  [facility] [address] [place], [place], [address] Phone: [phone]   Fax:  [phone]  Name: Kristin [name] MRN: [id number] Date of Birth: Jul 11, 1951

## 2021-04-28 NOTE — Telephone Encounter (Signed)
-----   Message from Frazier Butt, PT sent at 04/28/2021 12:18 PM EDT ----- Hi, I wanted to route you Shonice's note from our PT session today.  She is having some increased dyskinesias/?dystonia in her R foot and R toes with fatigue, with resisted exercises and with gait.  It is beginning to change her gait pattern.  I wanted to make you aware to see if there was anything you might could help her with on this end.  Thank you.  Mady Haagensen, PT

## 2021-04-28 NOTE — Telephone Encounter (Signed)
Noted dyskinesia in RLE in past but didn't note gait change.  Will put on cx list for VV.

## 2021-05-01 ENCOUNTER — Other Ambulatory Visit: Payer: Self-pay | Admitting: Neurology

## 2021-05-01 ENCOUNTER — Other Ambulatory Visit: Payer: Self-pay | Admitting: Family Medicine

## 2021-05-02 ENCOUNTER — Other Ambulatory Visit: Payer: Self-pay

## 2021-05-05 ENCOUNTER — Ambulatory Visit: Payer: Medicare Other | Admitting: Physical Therapy

## 2021-05-06 ENCOUNTER — Other Ambulatory Visit: Payer: Self-pay

## 2021-05-06 ENCOUNTER — Ambulatory Visit: Payer: Medicare Other | Attending: Family Medicine | Admitting: Physical Therapy

## 2021-05-06 ENCOUNTER — Encounter: Payer: Self-pay | Admitting: Physical Therapy

## 2021-05-06 DIAGNOSIS — R29818 Other symptoms and signs involving the nervous system: Secondary | ICD-10-CM | POA: Insufficient documentation

## 2021-05-06 DIAGNOSIS — R2689 Other abnormalities of gait and mobility: Secondary | ICD-10-CM

## 2021-05-06 DIAGNOSIS — M6281 Muscle weakness (generalized): Secondary | ICD-10-CM

## 2021-05-06 DIAGNOSIS — R2681 Unsteadiness on feet: Secondary | ICD-10-CM

## 2021-05-06 NOTE — Therapy (Signed)
[facility] [address] [place], [place], [address] Phone: [phone]   Fax:  [phone]  Physical Therapy Treatment  Patient Details  Name: Kristin [name] MRN: [id number] Date of Birth: [birth year] Referring Provider (PT): Gwendlyn Deutscher, [place] (([facility]); has also seen Dr. Vertell Limber)   Encounter Date: 05/06/2021   PT End of Session - 05/06/21 0851     Visit Number 15    Number of Visits 20    Date for PT Re-Evaluation 18/29/93   per recert 03/19/9677   Authorization Type Medicare/[employer]    PT Start Time 0849    PT Stop Time 0929    PT Time Calculation (min) 40 min    Equipment Utilized During Treatment Other (comment)    Activity Tolerance Patient tolerated treatment well    Behavior During Therapy [facility] for tasks assessed/performed             Past Medical History:  Diagnosis Date   Abnormal vaginal Pap smear    Acute pharyngitis 02/25/2013   Anemia    Anxiety    Arthritis    BCC (basal cell carcinoma of skin) 10/05/2014   On back   Chicken pox as a child   Chronic UTI    sees dr Terance Hart   Constipation 12/07/2015   Depression with anxiety 11/02/2009   Qualifier: Diagnosis of  By: [name], LPN, [name]    Dermatitis 07/17/2012   Esophageal stricture 1994   Fibroids    Foot pain, bilateral 06/19/2012   GERD (gastroesophageal reflux disease)    GERD (gastroesophageal reflux disease)    Hiatal hernia    Hyperglycemia 08/19/2013   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   Hyperlipidemia    Hypertension    Infertility, female    Low back pain 10/17/2007   Qualifier: Diagnosis of  By: [name], [name]    Measles as a child   Obesity    Osteoarthritis    Parkinson disease ([place])    Plantar fasciitis of left foot 06/19/2012   Preventative health care 12/19/2015   Rheumatoid arthritis ([place]) 01/08/2018   Rosacea 10/05/2014   Swallowing difficulty    Urinary frequency 02/25/2013   Visual floaters 05/11/2014    Past  Surgical History:  Procedure Laterality Date   ABDOMINAL HYSTERECTOMY  2006   total   esophageal     stretching   laporoscopy     LEFT HEART CATH AND CORONARY ANGIOGRAPHY N/A 06/02/2019   Procedure: LEFT HEART CATH AND CORONARY ANGIOGRAPHY;  Surgeon: Jettie Booze, MD;  Location: [facility];  Service: Cardiovascular;  Laterality: N/A;   TONSILLECTOMY     TOTAL HIP ARTHROPLASTY Right 2010   wisdom teeth extracted      There were no vitals filed for this visit.   Subjective Assessment - 05/06/21 0852     Subjective Helped out with grandkids over the weekend/beginning of the week; did pretty well, except had difficulty with the 2 steps into/out of the sunken living room.    Pertinent History See problem list    Patient Stated Goals To get back in the pool; to get back strength in my legs.    Currently in Pain? No/denies    Pain Onset More than a month ago                               [facility] Adult PT Treatment/Exercise - 05/06/21 0001  Ambulation/Gait   Ambulation/Gait Yes    Ambulation/Gait Assistance 5: Supervision;6: Modified independent (Device/Increase time)    Ambulation/Gait Assistance Details Cues for widened BOS; pt has questions about possibly using single walking pole and discussing benefits of using to improve confidence and balance.    Ambulation Distance (Feet) 345 Feet   indoors, 400 outdoors   Assistive device None    Gait Pattern Step-through pattern;Decreased arm swing - right;Decreased step length - right;Decreased step length - left;Decreased stance time - right;Decreased weight shift to right;Narrow base of support    Ambulation Surface Level;Indoor    Stairs Yes    Stairs Assistance 4: Min guard;5: Supervision    Stairs Assistance Details (indicate cue type and reason) 2 steps, then 4 steps, attempting to not use rail, step to pattern ascending and descending with cues of which foot to lead.    Stair Management Technique  No rails;Step to pattern;Forwards   intermittent UE support   Number of Stairs 4    Height of Stairs 6      Knee/Hip Exercises: Standing   Lateral Step Up Right;1 set;10 reps;Hand Hold: 1;Step Height: 6"    Lateral Step Up Limitations cues for full foot placement on step    Forward Step Up Right;Left;2 sets;Hand Hold: 2;10 reps;Step Height: 6"    Forward Step Up Limitations step/up, down/down 1st set; 2nd set single limb step up    SLS with Vectors Alt step taps to 6" step x 10, then 12" step x 10 reps, no UE support.      Ankle Exercises: Seated   Toe Raise 10 reps;Limitations   Resisted with green band, 2 sets x 10 reps   Other Seated Ankle Exercises Resisted ankle eversion, ankle dorsiflexion green theraband, 2 sets x 10             Brief seated breaks with slowed breathing       PT Education - 05/06/21 0914     Education Details Techniques for step negotiation (when no rails present)    Person(s) Educated Patient    Methods Explanation;Demonstration;Verbal cues    Comprehension Verbalized understanding;Returned demonstration              PT Short Term Goals - 04/18/21 1530       PT SHORT TERM GOAL #1   Title STGs = LTGs               PT Long Term Goals - 04/18/21 1531       PT LONG TERM GOAL #1   Title Pt will be independent with progression of/final HEP for improved functional mobility, strength, balance.  TARGET 05/20/2021    Baseline met for previously issued HEP, 04/18/21    Time 5    Period Weeks    Status Revised      PT LONG TERM GOAL #2   Title Pt will improve 5x sit<>stand to less than or equal to 11.5 seconds for improved functional strength.    Baseline 12.12 sec 6/17; 12.47 sec 04/18/21    Time 5    Period Weeks    Status Revised      PT LONG TERM GOAL #3   Title Pt will improve MiniBESTest score to at least 23/28 sec for decreased fall risk.    Baseline 20/28    Time 5    Period Weeks    Status New      PT LONG TERM GOAL #4    Title Pt will ambulate  at least 1000 ft, indoor and outdoor surfaces, independently, no LOB for improved mobility in community.    Time 5    Period Weeks    Status New      PT LONG TERM GOAL #5   Title Pt will verbalize plans for continued community fitness upon d/c from PT.    Time 5    Period Weeks    Status New                   Plan - 05/06/21 0914     Clinical Impression Statement Focused on standing strengthening and gait activities today; pt with increased fear with step negoitation today, with cues for foot placement/sequence, use of UE support as needed.  Pt continues to benefit from skilled PT to address strength, balance, gait, for improved overall functional mobility and decreased risk of falls.  Pt only seen once this week, due to pt being out of town.    Personal Factors and Comorbidities Comorbidity 3+    Comorbidities See problem list    Examination-Activity Limitations Locomotion Level;Transfers;Squat;Stand    Examination-Participation Restrictions Community Activity;Shop;Other   Community fitness   Stability/Clinical Decision Making Evolving/Moderate complexity    Rehab Potential Good    PT Frequency 2x / week    PT Duration 4 weeks   per recert 4/66/5993, to include 04/18/21 visit, so 5 week   PT Treatment/Interventions ADLs/Self Care Home Management;Aquatic Therapy;Moist Heat;Ultrasound;Cryotherapy;Scientist, product/process development;Neuromuscular re-education;Balance training;Therapeutic exercise;Therapeutic activities;Functional mobility training;Gait training;Patient/family education;Manual techniques;Passive range of motion    PT Next Visit Plan Continue work on Single limb stance, compliant surfaces, step strategy work, ankle strengthening for RLE; gait training on level, unlevel surfaces.  Review gastroc stretch added to HEP    Consulted and Agree with Plan of Care Patient             Patient will benefit from skilled therapeutic intervention in  order to improve the following deficits and impairments:  Abnormal gait, Difficulty walking, Decreased range of motion, Pain, Decreased balance, Impaired flexibility, Decreased mobility, Decreased strength, Postural dysfunction  Visit Diagnosis: Muscle weakness (generalized)  Unsteadiness on feet  Other abnormalities of gait and mobility     Problem List Patient Active Problem List   Diagnosis Date Noted   Dry mouth 02/24/2021   Anemia 03/16/2020   Coronary artery disease involving native coronary artery of native heart without angina pectoris 06/12/2019   Abnormal stress test    Diastolic dysfunction 57/09/7791   Cough 05/10/2018   Rheumatoid arthritis ([place]) 01/08/2018   Hand pain, right 10/15/2017   Vitamin D deficiency 12/20/2016   Prediabetes 12/20/2016   Shortness of breath on exertion 11/27/2016   Preventative health care 12/19/2015   Constipation 12/07/2015   BCC (basal cell carcinoma of skin) 10/05/2014   Rosacea 10/05/2014   Parkinson's disease ([place]) 05/11/2014   Visual floaters 05/11/2014   Paralysis agitans ([place]) 01/08/2014   Screening for cervical cancer 07/17/2012   Foot pain, bilateral 06/19/2012   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 05/06/2021, 9:33 AM Frazier Butt., PT  [facility]  [place], [place], [address] Phone: [phone]   Fax:  [phone]  Name: Kristin [name] MRN: [id number] Date of Birth: 22-Apr-1951

## 2021-05-10 ENCOUNTER — Ambulatory Visit: Payer: Medicare Other | Admitting: Physical Therapy

## 2021-05-10 ENCOUNTER — Other Ambulatory Visit: Payer: Self-pay

## 2021-05-10 ENCOUNTER — Encounter: Payer: Self-pay | Admitting: Physical Therapy

## 2021-05-10 DIAGNOSIS — R2689 Other abnormalities of gait and mobility: Secondary | ICD-10-CM

## 2021-05-10 DIAGNOSIS — R29818 Other symptoms and signs involving the nervous system: Secondary | ICD-10-CM | POA: Diagnosis not present

## 2021-05-10 DIAGNOSIS — R2681 Unsteadiness on feet: Secondary | ICD-10-CM

## 2021-05-10 DIAGNOSIS — M6281 Muscle weakness (generalized): Secondary | ICD-10-CM

## 2021-05-10 NOTE — Therapy (Signed)
[facility] [address] [place], [place], [address] Phone: [phone]   Fax:  [phone]  Physical Therapy Treatment  Patient Details  Name: Kristin [name] MRN: [id number] Date of Birth: 1950/10/29 Referring Provider (PT): Gwendlyn Deutscher, [place] (([facility]); has also seen Dr. Vertell Limber)   Encounter Date: 05/10/2021   PT End of Session - 05/10/21 0924     Visit Number 16    Number of Visits 20    Date for PT Re-Evaluation 97/98/92   per recert 09/22/4172   Authorization Type Medicare/[employer]    PT Start Time [phone]    PT Stop Time 0930    PT Time Calculation (min) 40 min    Equipment Utilized During Treatment Other (comment)    Activity Tolerance Patient tolerated treatment well    Behavior During Therapy [facility] for tasks assessed/performed             Past Medical History:  Diagnosis Date   Abnormal vaginal Pap smear    Acute pharyngitis 02/25/2013   Anemia    Anxiety    Arthritis    BCC (basal cell carcinoma of skin) 10/05/2014   On back   Chicken pox as a child   Chronic UTI    sees dr Terance Hart   Constipation 12/07/2015   Depression with anxiety 11/02/2009   Qualifier: Diagnosis of  By: [name], LPN, [name]    Dermatitis 07/17/2012   Esophageal stricture 1994   Fibroids    Foot pain, bilateral 06/19/2012   GERD (gastroesophageal reflux disease)    GERD (gastroesophageal reflux disease)    Hiatal hernia    Hyperglycemia 08/19/2013   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   Hyperlipidemia    Hypertension    Infertility, female    Low back pain 10/17/2007   Qualifier: Diagnosis of  By: [name], [name]    Measles as a child   Obesity    Osteoarthritis    Parkinson disease ([place])    Plantar fasciitis of left foot 06/19/2012   Preventative health care 12/19/2015   Rheumatoid arthritis ([place]) 01/08/2018   Rosacea 10/05/2014   Swallowing difficulty    Urinary frequency 02/25/2013   Visual floaters 05/11/2014    Past  Surgical History:  Procedure Laterality Date   ABDOMINAL HYSTERECTOMY  2006   total   esophageal     stretching   laporoscopy     LEFT HEART CATH AND CORONARY ANGIOGRAPHY N/A 06/02/2019   Procedure: LEFT HEART CATH AND CORONARY ANGIOGRAPHY;  Surgeon: Jettie Booze, MD;  Location: [facility];  Service: Cardiovascular;  Laterality: N/A;   TONSILLECTOMY     TOTAL HIP ARTHROPLASTY Right 2010   wisdom teeth extracted      There were no vitals filed for this visit.   Subjective Assessment - 05/10/21 0854     Subjective Did go out several times for my birthday.  No changes, no falls.  L knee bothers me sometimes, and it feels better the more I move.    Pertinent History See problem list    Patient Stated Goals To get back in the pool; to get back strength in my legs.    Currently in Pain? No/denies    Pain Onset More than a month ago                               [facility] Adult PT Treatment/Exercise - 05/10/21  0001       Ambulation/Gait   Ambulation/Gait Yes    Ambulation/Gait Assistance 5: Supervision;6: Modified independent (Device/Increase time)    Ambulation/Gait Assistance Details With single walking pole today, pt reports improved confidence with gait.  Pt stops several times to stretch/weightbear through R foot.    Ambulation Distance (Feet) 800 Feet    Assistive device None;Other (Comment)   single walking pole   Gait Pattern Step-through pattern;Decreased arm swing - right;Decreased step length - right;Decreased step length - left;Decreased stance time - right;Decreased weight shift to right;Narrow base of support    Ambulation Surface Level;Indoor    Stairs Yes    Stairs Assistance 5: Supervision    Stairs Assistance Details (indicate cue type and reason) step to pattern ascending (R foot leading due to L knee pain)    Stair Management Technique Two rails;Step to pattern;Forwards;Alternating pattern    Number of Stairs 4   x 3   Height of  Stairs 6    Curb 5: Supervision    Curb Details (indicate cue type and reason) Performed x 4 reps, leading up and down RLE, due to L knee pain.      Knee/Hip Exercises: Aerobic   Stepper Seated, 4 extremities, Level 2, x 6 minutes for leg strengthening.  Keeps steps/min >85 for increased intensity.  Performed 2 minutes 4 extremities, then 4 minutes, legs only.      Knee/Hip Exercises: Standing   Lateral Step Up Right;1 set;10 reps;Hand Hold: 1;Step Height: 6"    Forward Step Up Right;2 sets;Hand Hold: 2;10 reps;Step Height: 6"   did not do with L due to L knee pain with weightbearing   Forward Step Up Limitations step/up, down/down 1st set; 2nd set single limb step up    Step Down Right;1 set;10 reps;Hand Hold: 2;Step Height: 6"    SLS with Vectors Consecutive step taps to 12" step with LLE, RLE as stance, x 10. Intermittent UE support. Repeated x 10 reps,  LLE as stance, RLE tapping.  Alt step taps to 12" step, x 10 reps      Ankle Exercises: Seated   Toe Raise 10 reps;Limitations   green band, 3 reps   Other Seated Ankle Exercises Resisted ankle eversion, ankle dorsiflexion green theraband, 3 sets x 10               Additional gait with walking pole indoors, 200 ft at end of session.  Pt does take standing stretch break, with stagger stance rocking.       PT Short Term Goals - 04/18/21 1530       PT SHORT TERM GOAL #1   Title STGs = LTGs               PT Long Term Goals - 04/18/21 1531       PT LONG TERM GOAL #1   Title Pt will be independent with progression of/final HEP for improved functional mobility, strength, balance.  TARGET 05/20/2021    Baseline met for previously issued HEP, 04/18/21    Time 5    Period Weeks    Status Revised      PT LONG TERM GOAL #2   Title Pt will improve 5x sit<>stand to less than or equal to 11.5 seconds for improved functional strength.    Baseline 12.12 sec 6/17; 12.47 sec 04/18/21    Time 5    Period Weeks    Status  Revised      PT LONG  TERM GOAL #3   Title Pt will improve MiniBESTest score to at least 23/28 sec for decreased fall risk.    Baseline 20/28    Time 5    Period Weeks    Status New      PT LONG TERM GOAL #4   Title Pt will ambulate at least 1000 ft, indoor and outdoor surfaces, independently, no LOB for improved mobility in community.    Time 5    Period Weeks    Status New      PT LONG TERM GOAL #5   Title Pt will verbalize plans for continued community fitness upon d/c from PT.    Time 5    Period Weeks    Status New                   Plan - 05/10/21 0925     Clinical Impression Statement Continued work on lower extremity strengthening for improved single limb stance.  She does report L knee pain (new in the past 1-2 weeks, but pt has experienced in the past), which is worst with weightbearing/loading through LLE.  She is able to use seated Stepper for strengthenign, with no c/o pain.  She continues to feel that balance is compromised, with gait.  She will continue to benefit from skilled PT to address balance, strength, and gait for improved overall functional mobility.    Personal Factors and Comorbidities Comorbidity 3+    Comorbidities See problem list    Examination-Activity Limitations Locomotion Level;Transfers;Squat;Stand    Examination-Participation Restrictions Community Activity;Shop;Other   Community fitness   Stability/Clinical Decision Making Evolving/Moderate complexity    Rehab Potential Good    PT Frequency 2x / week    PT Duration 4 weeks   per recert 9/76/7341, to include 04/18/21 visit, so 5 week   PT Treatment/Interventions ADLs/Self Care Home Management;Aquatic Therapy;Moist Heat;Ultrasound;Cryotherapy;Scientist, product/process development;Neuromuscular re-education;Balance training;Therapeutic exercise;Therapeutic activities;Functional mobility training;Gait training;Patient/family education;Manual techniques;Passive range of motion    PT Next  Visit Plan Add to HEP for leg strengthening.  Continue work on Single limb stance, compliant surfaces, step strategy work, ankle strengthening for RLE; gait training on level, unlevel surfaces.    Consulted and Agree with Plan of Care Patient             Patient will benefit from skilled therapeutic intervention in order to improve the following deficits and impairments:  Abnormal gait, Difficulty walking, Decreased range of motion, Pain, Decreased balance, Impaired flexibility, Decreased mobility, Decreased strength, Postural dysfunction  Visit Diagnosis: Other abnormalities of gait and mobility  Unsteadiness on feet  Muscle weakness (generalized)     Problem List Patient Active Problem List   Diagnosis Date Noted   Dry mouth 02/24/2021   Anemia 03/16/2020   Coronary artery disease involving native coronary artery of native heart without angina pectoris 06/12/2019   Abnormal stress test    Diastolic dysfunction 93/79/0240   Cough 05/10/2018   Rheumatoid arthritis ([place]) 01/08/2018   Hand pain, right 10/15/2017   Vitamin D deficiency 12/20/2016   Prediabetes 12/20/2016   Shortness of breath on exertion 11/27/2016   Preventative health care 12/19/2015   Constipation 12/07/2015   BCC (basal cell carcinoma of skin) 10/05/2014   Rosacea 10/05/2014   Parkinson's disease ([place]) 05/11/2014   Visual floaters 05/11/2014   Paralysis agitans ([place]) 01/08/2014   Screening for cervical cancer 07/17/2012   Foot pain, bilateral 06/19/2012   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 05/10/2021, 9:34 AM Frazier Butt., PT  [facility] [address] [place], [place], [address] Phone: [phone]   Fax:  [phone]  Name: CORRISSA MARTELLO MRN: [id number] Date of Birth: 1951-09-01

## 2021-05-12 ENCOUNTER — Ambulatory Visit: Payer: Medicare Other | Admitting: Physical Therapy

## 2021-05-12 ENCOUNTER — Encounter: Payer: Self-pay | Admitting: Physical Therapy

## 2021-05-12 ENCOUNTER — Ambulatory Visit: Payer: Medicare Other | Admitting: Occupational Therapy

## 2021-05-12 ENCOUNTER — Other Ambulatory Visit: Payer: Self-pay

## 2021-05-12 DIAGNOSIS — M6281 Muscle weakness (generalized): Secondary | ICD-10-CM

## 2021-05-12 DIAGNOSIS — R2681 Unsteadiness on feet: Secondary | ICD-10-CM

## 2021-05-12 DIAGNOSIS — R29818 Other symptoms and signs involving the nervous system: Secondary | ICD-10-CM | POA: Diagnosis not present

## 2021-05-12 DIAGNOSIS — R2689 Other abnormalities of gait and mobility: Secondary | ICD-10-CM | POA: Diagnosis not present

## 2021-05-12 NOTE — Therapy (Addendum)
[facility] [address] [place], [place], [address] Phone: [phone]   Fax:  [phone]  Patient Details  Name: Kristin [name] MRN: [id number] Date of Birth: [birth year] Referring Provider:  Mosie Lukes, MD  Encounter Date: 05/12/2021 Occupational Therapy Parkinson's Disease Screen   Hand dominance: left  9-hole peg test:    RUE  29.25 sec        LUE  19 sec  Box & Blocks Test:   RUE  66 blocks        LUE  66 blocks   Change in ability to perform ADLs/IADLs:  no, issues with ADL performance.  Other Comments:  Handwriting demonstrates good legibility and letter size   Pt does not require occupational therapy services at this time.  Recommended occupational therapy screen in   6-8 months   Ninnie Fein 05/12/2021, 8:37 AM  [facility] [address] [place], [place], [address] Phone: [phone]   Fax:  [phone]

## 2021-05-12 NOTE — Therapy (Addendum)
[facility] [address] [place], [place], [address] Phone: [phone]   Fax:  [phone]  Physical Therapy Treatment  Patient Details  Name: Kristin [name] MRN: [id number] Date of Birth: 04/05/1951 Referring Provider (PT): Gwendlyn Deutscher, [place] (([facility]); has also seen Dr. Vertell Limber)   Encounter Date: 05/12/2021   PT End of Session - 05/12/21 0929     Visit Number 17    Number of Visits 20    Date for PT Re-Evaluation 29/47/65   per recert 4/65/0354   Authorization Type Medicare/[employer]    PT Start Time 6568    PT Stop Time 0928    PT Time Calculation (min) 41 min    Activity Tolerance Patient tolerated treatment well    Behavior During Therapy [facility] for tasks assessed/performed             Past Medical History:  Diagnosis Date   Abnormal vaginal Pap smear    Acute pharyngitis 02/25/2013   Anemia    Anxiety    Arthritis    BCC (basal cell carcinoma of skin) 10/05/2014   On back   Chicken pox as a child   Chronic UTI    sees dr Terance Hart   Constipation 12/07/2015   Depression with anxiety 11/02/2009   Qualifier: Diagnosis of  By: [name], LPN, [place] M    Dermatitis 07/17/2012   Esophageal stricture 1994   Fibroids    Foot pain, bilateral 06/19/2012   GERD (gastroesophageal reflux disease)    GERD (gastroesophageal reflux disease)    Hiatal hernia    Hyperglycemia 08/19/2013   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   Hyperlipidemia    Hypertension    Infertility, female    Low back pain 10/17/2007   Qualifier: Diagnosis of  By: [name], [name]    Measles as a child   Obesity    Osteoarthritis    Parkinson disease ([place])    Plantar fasciitis of left foot 06/19/2012   Preventative health care 12/19/2015   Rheumatoid arthritis ([place]) 01/08/2018   Rosacea 10/05/2014   Swallowing difficulty    Urinary frequency 02/25/2013   Visual floaters 05/11/2014    Past Surgical History:  Procedure Laterality Date    ABDOMINAL HYSTERECTOMY  2006   total   esophageal     stretching   laporoscopy     LEFT HEART CATH AND CORONARY ANGIOGRAPHY N/A 06/02/2019   Procedure: LEFT HEART CATH AND CORONARY ANGIOGRAPHY;  Surgeon: Jettie Booze, MD;  Location: [facility];  Service: Cardiovascular;  Laterality: N/A;   TONSILLECTOMY     TOTAL HIP ARTHROPLASTY Right 2010   wisdom teeth extracted      There were no vitals filed for this visit.   Subjective Assessment - 05/12/21 0849     Subjective Uses her walking pole as needed for confidence. Notices L knee hurts when going up the taller steps.    Pertinent History See problem list    Patient Stated Goals To get back in the pool; to get back strength in my legs.    Currently in Pain? No/denies    Pain Onset More than a month ago                               [facility] Adult PT Treatment/Exercise - 05/12/21 0851       Therapeutic Activites    Therapeutic Activities Other Therapeutic Activities  Other Therapeutic Activities pt reporting that she recently got a membership at [employer] and she has access to a seated SciFit stepper (has tried it 1x before and really enjoys this machine), discussed beginning at 8-10 minutes at gear 2.0, working at an intensity level of 7/10 with bigger movement patterns with BUE/BLE      Knee/Hip Exercises: Aerobic   Stepper Seated, 4 extremities, Level 2>2,3, x 6 minutes for leg strengthening, ROM.  Keeps steps/min >100 for increased intensity.  cues for big movements with BUE/BLE                 Balance Exercises - 05/12/21 0001       Balance Exercises: Standing   SLS with Vectors Foam/compliant surface;Upper extremity assist 1    SLS with Vectors Limitations Standing on Airex at bottom of staircase:  alt single step taps to 6" step then 12" step x10 reps each, with intermittent UE support    Stepping Strategy Posterior;Anterior;Foam/compliant surface;UE support;10 reps;Limitations     Stepping Strategy Limitations next to countertop on air ex, initial cues for weight shift, x10 reps each direction with intermittent UE support            Access Code: [email or web address] URL: [email or web address] Date: 05/12/2021 Prepared by: Janann August  Verbally reviewed exercises below   Exercises Clamshell - 1 x daily - 5 x weekly - 1-2 sets - 10 reps Seated Figure 4 Piriformis Stretch - 1-2 x daily - 7 x weekly - 1 sets - 3 reps - 10-15 sec hold Small Range Straight Leg Raise - 1 x daily - 7 x weekly - 3 sets - 10 reps Sidelying Hip Abduction - 1 x daily - 7 x weekly - 3 sets - 10 reps Seated Hamstring Stretch - 1 x daily - 7 x weekly - 1 sets - 3 reps - 30 sec hold Seated Heel Toe Raises - 1 x daily - 5 x weekly - 1-2 sets - 10 reps Staggered Stance Forward Backward Weight Shift with Counter Support - 1 x daily - 5 x weekly - 1-2 sets - 10 reps Standing Gastroc Stretch on Step - 1 x daily - 7 x weekly - 1 sets - 3 reps - 15-30 hold   New additions on 05/12/21:  Pt to use her aerobic step next to her dresser in her room for BUE support as needed (keeps her step under the bed)  Lateral Step Ups - 1 x daily - 5 x weekly - 1-2 sets - 10 reps - cues for sequencing, step up/up and down/down performed 2 x 10 reps leading with LLE, x10 reps leading with RLE, no knee pain, needs cues for big steps to avoid narrow BOS  Forward Step Up - 1 x daily - 5 x weekly - 1-2 sets - 10 reps - x10 reps B, step up/up and down/down alternating legs with cues for incr foot clearance     PT Education - 05/12/21 0929     Education Details leg strengthening additions to HEP    Person(s) Educated Patient    Methods Explanation;Demonstration;Handout    Comprehension Verbalized understanding;Returned demonstration              PT Short Term Goals - 04/18/21 1530       PT SHORT TERM GOAL #1   Title STGs = LTGs               PT Long Term Goals - 04/18/21 1531  PT LONG  TERM GOAL #1   Title Pt will be independent with progression of/final HEP for improved functional mobility, strength, balance.  TARGET 05/20/2021    Baseline met for previously issued HEP, 04/18/21    Time 5    Period Weeks    Status Revised      PT LONG TERM GOAL #2   Title Pt will improve 5x sit<>stand to less than or equal to 11.5 seconds for improved functional strength.    Baseline 12.12 sec 6/17; 12.47 sec 04/18/21    Time 5    Period Weeks    Status Revised      PT LONG TERM GOAL #3   Title Pt will improve MiniBESTest score to at least 23/28 sec for decreased fall risk.    Baseline 20/28    Time 5    Period Weeks    Status New      PT LONG TERM GOAL #4   Title Pt will ambulate at least 1000 ft, indoor and outdoor surfaces, independently, no LOB for improved mobility in community.    Time 5    Period Weeks    Status New      PT LONG TERM GOAL #5   Title Pt will verbalize plans for continued community fitness upon d/c from PT.    Time 5    Period Weeks    Status New                   Plan - 05/12/21 1217     Clinical Impression Statement Continued to use seated scifit for strengthening and working on bigger movement patterns with no knee pain. Pt to utilize for aerobic activity outside of therapy with new membership at [employer]. Added step ups to pt's HEP for BLE strengthening with use of 4" aerobic step (pt has one at home). Pt tolerated well with no incr in knee pain. Will continue to progress towards LTGs.    Personal Factors and Comorbidities Comorbidity 3+    Comorbidities See problem list    Examination-Activity Limitations Locomotion Level;Transfers;Squat;Stand    Examination-Participation Restrictions Community Activity;Shop;Other   Community fitness   Stability/Clinical Decision Making Evolving/Moderate complexity    Rehab Potential Good    PT Frequency 2x / week    PT Duration 4 weeks   per recert 3/76/2831, to include 04/18/21 visit, so 5 week   PT  Treatment/Interventions ADLs/Self Care Home Management;Aquatic Therapy;Moist Heat;Ultrasound;Cryotherapy;Scientist, product/process development;Neuromuscular re-education;Balance training;Therapeutic exercise;Therapeutic activities;Functional mobility training;Gait training;Patient/family education;Manual techniques;Passive range of motion    PT Next Visit Plan ;LTGs due this week. review step up additions to HEP. Continue work on Single limb stance, compliant surfaces, step strategy work, ankle strengthening for RLE; gait training on level, unlevel surfaces.    Consulted and Agree with Plan of Care Patient             Patient will benefit from skilled therapeutic intervention in order to improve the following deficits and impairments:  Abnormal gait, Difficulty walking, Decreased range of motion, Pain, Decreased balance, Impaired flexibility, Decreased mobility, Decreased strength, Postural dysfunction  Visit Diagnosis: Muscle weakness (generalized)  Other abnormalities of gait and mobility  Unsteadiness on feet  Other symptoms and signs involving the nervous system     Problem List Patient Active Problem List   Diagnosis Date Noted   Dry mouth 02/24/2021   Anemia 03/16/2020   Coronary artery disease involving native coronary artery of native heart without angina pectoris 06/12/2019   Abnormal  stress test    Diastolic dysfunction 23/53/6144   Cough 05/10/2018   Rheumatoid arthritis ([place]) 01/08/2018   Hand pain, right 10/15/2017   Vitamin D deficiency 12/20/2016   Prediabetes 12/20/2016   Shortness of breath on exertion 11/27/2016   Preventative health care 12/19/2015   Constipation 12/07/2015   BCC (basal cell carcinoma of skin) 10/05/2014   Rosacea 10/05/2014   Parkinson's disease ([place]) 05/11/2014   Visual floaters 05/11/2014   Paralysis agitans ([place]) 01/08/2014   Screening for cervical cancer 07/17/2012   Foot pain, bilateral 06/19/2012   Hyperhydrosis disorder  02/15/2012   Obesity 11/12/2009   Depression with anxiety 11/02/2009   Urinary tract infection 12/03/2008   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   Lumbar back pain with radiculopathy affecting lower extremity 10/17/2007   Essential hypertension 07/05/2007   Osteoarthritis 07/05/2007   Hyperlipidemia 06/26/2007   H/O: iron deficiency anemia 05/08/2007   Hiatal hernia with GERD 05/08/2007    Arliss Journey, PT, DPT  05/12/2021, 12:21 PM  [facility] [address] [place], [place], [address] Phone: [phone]   Fax:  [phone]  Name: TYSHEENA GINZBURG MRN: [id number] Date of Birth: 06-24-1951

## 2021-05-17 ENCOUNTER — Other Ambulatory Visit: Payer: Self-pay

## 2021-05-17 ENCOUNTER — Other Ambulatory Visit: Payer: Self-pay | Admitting: Family Medicine

## 2021-05-17 ENCOUNTER — Ambulatory Visit: Payer: Medicare Other | Admitting: Physical Therapy

## 2021-05-17 ENCOUNTER — Encounter: Payer: Self-pay | Admitting: Physical Therapy

## 2021-05-17 DIAGNOSIS — M6281 Muscle weakness (generalized): Secondary | ICD-10-CM | POA: Diagnosis not present

## 2021-05-17 DIAGNOSIS — R2681 Unsteadiness on feet: Secondary | ICD-10-CM | POA: Diagnosis not present

## 2021-05-17 DIAGNOSIS — R2689 Other abnormalities of gait and mobility: Secondary | ICD-10-CM

## 2021-05-17 DIAGNOSIS — R29818 Other symptoms and signs involving the nervous system: Secondary | ICD-10-CM | POA: Diagnosis not present

## 2021-05-17 NOTE — Therapy (Signed)
[facility] [address] [place], [place], [address] Phone: [phone]   Fax:  [phone]  Physical Therapy Treatment  Patient Details  Name: Kristin [name] MRN: [id number] Date of Birth: 07-23-1951 Referring Provider (PT): Gwendlyn Deutscher, [place] (([facility]); has also seen Dr. Vertell Limber)   Encounter Date: 05/17/2021   PT End of Session - 05/17/21 0900     Visit Number 18    Number of Visits 20    Date for PT Re-Evaluation 79/02/40   per recert 9/73/5329   Authorization Type Medicare/[employer]    PT Start Time 0850    PT Stop Time 0928    PT Time Calculation (min) 38 min    Activity Tolerance Patient tolerated treatment well    Behavior During Therapy [name] for tasks assessed/performed             Past Medical History:  Diagnosis Date   Abnormal vaginal Pap smear    Acute pharyngitis 02/25/2013   Anemia    Anxiety    Arthritis    BCC (basal cell carcinoma of skin) 10/05/2014   On back   Chicken pox as a child   Chronic UTI    sees dr Terance Hart   Constipation 12/07/2015   Depression with anxiety 11/02/2009   Qualifier: Diagnosis of  By: [name], LPN, Bonnye M    Dermatitis 07/17/2012   Esophageal stricture 1994   Fibroids    Foot pain, bilateral 06/19/2012   GERD (gastroesophageal reflux disease)    GERD (gastroesophageal reflux disease)    Hiatal hernia    Hyperglycemia 08/19/2013   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   Hyperlipidemia    Hypertension    Infertility, female    Low back pain 10/17/2007   Qualifier: Diagnosis of  By: [name], [name]    Measles as a child   Obesity    Osteoarthritis    Parkinson disease ([place])    Plantar fasciitis of left foot 06/19/2012   Preventative health care 12/19/2015   Rheumatoid arthritis ([place]) 01/08/2018   Rosacea 10/05/2014   Swallowing difficulty    Urinary frequency 02/25/2013   Visual floaters 05/11/2014    Past Surgical History:  Procedure Laterality Date    ABDOMINAL HYSTERECTOMY  2006   total   esophageal     stretching   laporoscopy     LEFT HEART CATH AND CORONARY ANGIOGRAPHY N/A 06/02/2019   Procedure: LEFT HEART CATH AND CORONARY ANGIOGRAPHY;  Surgeon: Jettie Booze, MD;  Location: [facility];  Service: Cardiovascular;  Laterality: N/A;   TONSILLECTOMY     TOTAL HIP ARTHROPLASTY Right 2010   wisdom teeth extracted      There were no vitals filed for this visit.   Subjective Assessment - 05/17/21 0852     Subjective No changes, doing well.  Was able to get up the steps in the pool yesterday.  When I'm walking I feel I'm slowed down, overall.  I joined [employer] and I know they have a walking track.    Pertinent History See problem list    Patient Stated Goals To get back in the pool; to get back strength in my legs.    Currently in Pain? No/denies    Pain Onset More than a month ago                               [facility] Adult PT Treatment/Exercise -  05/17/21 0001       Ambulation/Gait   Ambulation/Gait Yes    Ambulation/Gait Assistance 5: Supervision;6: Modified independent (Device/Increase time)    Ambulation Distance (Feet) 1200 Feet    Assistive device Other (Comment)   Single walking pole   Gait Pattern Step-through pattern;Decreased arm swing - right;Decreased step length - right;Decreased step length - left;Decreased stance time - right;Decreased weight shift to right;Narrow base of support    Ambulation Surface Level;Outdoor;Indoor;Unlevel    Gait Comments Gait outdoors using single walking pole, on sidewalk and paved surfaces, including gentle inclines/declines.  Pt needs several standing rest breaks throughout gait.  Discussed using walking track at [facility].  Discussed ways to track improved gait endurance:  increased distance/number of laps, decreased number of standing/seated rest breaks over time.      Knee/Hip Exercises: Aerobic   Stepper Seated, 4 extremities, Level  2,3, x 6 minutes for leg strengthening, ROM.  Keeps steps/min >100 for increased intensity.  cues for big movements with BUE/BLE.  Then 2 minutes, BLEs only, keeping steps> 100.               Reviewed update to HEP last visit:  Forward step ups at 4" aerobic step, 10-12 reps each leg leading Side step ups at 4" aerobic step, 10-12 reps each leg leading  Cues to PWR! Up through stance leg and to return to floor with wide BOS for improved balance.      PT Education - 05/17/21 2051     Education Details Reviewed HEP from last visit; discussed using [employer] walking track    Person(s) Educated Patient    Methods Explanation    Comprehension Verbalized understanding              PT Short Term Goals - 04/18/21 1530       PT SHORT TERM GOAL #1   Title STGs = LTGs               PT Long Term Goals - 04/18/21 1531       PT LONG TERM GOAL #1   Title Pt will be independent with progression of/final HEP for improved functional mobility, strength, balance.  TARGET 05/20/2021    Baseline met for previously issued HEP, 04/18/21    Time 5    Period Weeks    Status Revised      PT LONG TERM GOAL #2   Title Pt will improve 5x sit<>stand to less than or equal to 11.5 seconds for improved functional strength.    Baseline 12.12 sec 6/17; 12.47 sec 04/18/21    Time 5    Period Weeks    Status Revised      PT LONG TERM GOAL #3   Title Pt will improve MiniBESTest score to at least 23/28 sec for decreased fall risk.    Baseline 20/28    Time 5    Period Weeks    Status New      PT LONG TERM GOAL #4   Title Pt will ambulate at least 1000 ft, indoor and outdoor surfaces, independently, no LOB for improved mobility in community.    Time 5    Period Weeks    Status New      PT LONG TERM GOAL #5   Title Pt will verbalize plans for continued community fitness upon d/c from PT.    Time 5    Period Weeks    Status New  Plan - 05/17/21 2052      Clinical Impression Statement Pt progressing towards LTGs, with outdoors, longer distance gait today with single walking pole.  Pt able to negotiate sidewalks and paved area >1200 ft, but she does need several standing rest breaks.  Pt return demo understanding of forward and lateral step ups given last visit as additions to HEP.  She continues to demonstrate imrpoved gait and balance, but still reports decreased confidence with gait at times.  Continue to work towards [place].    Personal Factors and Comorbidities Comorbidity 3+    Comorbidities See problem list    Examination-Activity Limitations Locomotion Level;Transfers;Squat;Stand    Examination-Participation Restrictions Community Activity;Shop;Other   Community fitness   Stability/Clinical Decision Making Evolving/Moderate complexity    Rehab Potential Good    PT Frequency 2x / week    PT Duration 4 weeks   per recert 10/06/3341, to include 04/18/21 visit, so 5 week   PT Treatment/Interventions ADLs/Self Care Home Management;Aquatic Therapy;Moist Heat;Ultrasound;Cryotherapy;Scientist, product/process development;Neuromuscular re-education;Balance training;Therapeutic exercise;Therapeutic activities;Functional mobility training;Gait training;Patient/family education;Manual techniques;Passive range of motion    PT Next Visit Plan Check LTGs and discuss discharge/ versus renew.    Consulted and Agree with Plan of Care Patient             Patient will benefit from skilled therapeutic intervention in order to improve the following deficits and impairments:  Abnormal gait, Difficulty walking, Decreased range of motion, Pain, Decreased balance, Impaired flexibility, Decreased mobility, Decreased strength, Postural dysfunction  Visit Diagnosis: Other abnormalities of gait and mobility  Muscle weakness (generalized)  Unsteadiness on feet     Problem List Patient Active Problem List   Diagnosis Date Noted   Dry mouth 02/24/2021    Anemia 03/16/2020   Coronary artery disease involving native coronary artery of native heart without angina pectoris 06/12/2019   Abnormal stress test    Diastolic dysfunction 56/86/1683   Cough 05/10/2018   Rheumatoid arthritis ([place]) 01/08/2018   Hand pain, right 10/15/2017   Vitamin D deficiency 12/20/2016   Prediabetes 12/20/2016   Shortness of breath on exertion 11/27/2016   Preventative health care 12/19/2015   Constipation 12/07/2015   BCC (basal cell carcinoma of skin) 10/05/2014   Rosacea 10/05/2014   Parkinson's disease ([place]) 05/11/2014   Visual floaters 05/11/2014   Paralysis agitans ([place]) 01/08/2014   Screening for cervical cancer 07/17/2012   Foot pain, bilateral 06/19/2012   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   Obesity 11/12/2009   Depression with anxiety 11/02/2009   Urinary tract infection 12/03/2008   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   Lumbar back pain with radiculopathy affecting lower extremity 10/17/2007   Essential hypertension 07/05/2007   Osteoarthritis 07/05/2007   Hyperlipidemia 06/26/2007   H/O: iron deficiency anemia 05/08/2007   Hiatal hernia with GERD 05/08/2007    Donald Jacque W., PT 05/17/2021, 8:56 PM Frazier Butt., PT  [facility] [address] [place], [place], [address] Phone: [phone]   Fax:  [phone]  Name: Kristin [name] MRN: [id number] Date of Birth: Jun 22, 1951

## 2021-05-20 ENCOUNTER — Encounter: Payer: Self-pay | Admitting: Physical Therapy

## 2021-05-20 ENCOUNTER — Other Ambulatory Visit: Payer: Self-pay

## 2021-05-20 ENCOUNTER — Ambulatory Visit: Payer: Medicare Other | Admitting: Physical Therapy

## 2021-05-20 DIAGNOSIS — M6281 Muscle weakness (generalized): Secondary | ICD-10-CM | POA: Diagnosis not present

## 2021-05-20 DIAGNOSIS — R2681 Unsteadiness on feet: Secondary | ICD-10-CM | POA: Diagnosis not present

## 2021-05-20 DIAGNOSIS — R29818 Other symptoms and signs involving the nervous system: Secondary | ICD-10-CM | POA: Diagnosis not present

## 2021-05-20 DIAGNOSIS — R2689 Other abnormalities of gait and mobility: Secondary | ICD-10-CM | POA: Diagnosis not present

## 2021-05-20 NOTE — Therapy (Signed)
[facility] [address] [place], [place], [address] Phone: [phone]   Fax:  [phone] Physical Therapy Treatment/Discharge Summary  Patient Details  Name: Kristin [name] MRN: [id number] Date of Birth: 03-Jan-1951 Referring Provider (PT): Gwendlyn Deutscher, [place] (([facility]); has also seen Dr. Vertell Limber)   [place]  Visits from Start of Care: 19  Current functional level related to goals / functional outcomes:  PT Long Term Goals - 05/20/21 0858       PT LONG TERM GOAL #1   Title Pt will be independent with progression of/final HEP for improved functional mobility, strength, balance.  TARGET 05/20/2021    Baseline met for previously issued HEP, 04/18/21    Time 5    Period Weeks    Status Achieved      PT LONG TERM GOAL #2   Title Pt will improve 5x sit<>stand to less than or equal to 11.5 seconds for improved functional strength.    Baseline 12.12 sec 6/17; 12.47 sec 04/18/21; 12.72 sec 05/20/2021    Time 5    Period Weeks    Status Not Met      PT LONG TERM GOAL #3   Title Pt will improve MiniBESTest score to at least 23/28 sec for decreased fall risk.    Baseline 20/28; 21/28 05/20/21    Time 5    Period Weeks    Status Not Met      PT LONG TERM GOAL #4   Title Pt will ambulate at least 1000 ft, indoor and outdoor surfaces, independently, no LOB for improved mobility in community.    Time 5    Period Weeks    Status Achieved      PT LONG TERM GOAL #5   Title Pt will verbalize plans for continued community fitness upon d/c from PT.    Baseline M:  swimming, T:  yoga at [place] (will try), W:  PWR! Moves, Th:  [place]/ACT, F:  PD cycling (to get back into): [employer] 3x/wk in the afternoons with husband    Time 5    Period Weeks    Status Achieved            Pt has met 3 of 5 LTGs.   Remaining deficits: High level balance, toes curling(?dystonia) on R foot   Education /  Equipment: Educated in HEP progression, community fitness options, plan to speak to Dr. Carles Collet about the toes curling with intentional movement patterns on RLE.   Patient agrees to discharge. Patient goals were partially met. Patient is being discharged due to being pleased with functional level.  Mady Haagensen, PT 05/20/21 11:49 AM Phone: [phone] Fax: [phone]   Encounter Date: 05/20/2021   PT End of Session - 05/20/21 1138     Visit Number 19    Number of Visits 20    Date for PT Re-Evaluation 71/06/26   per recert 9/48/5462   Authorization Type Medicare/[employer]    PT Start Time 0850    PT Stop Time 0931    PT Time Calculation (min) 41 min    Activity Tolerance Patient tolerated treatment well    Behavior During Therapy [facility] for tasks assessed/performed             Past Medical History:  Diagnosis Date   Abnormal vaginal Pap smear    Acute pharyngitis 02/25/2013   Anemia    Anxiety    Arthritis    BCC (basal cell  carcinoma of skin) 10/05/2014   On back   Chicken pox as a child   Chronic UTI    sees dr Terance Hart   Constipation 12/07/2015   Depression with anxiety 11/02/2009   Qualifier: Diagnosis of  By: [name], LPN, Bonnye M    Dermatitis 07/17/2012   Esophageal stricture 1994   Fibroids    Foot pain, bilateral 06/19/2012   GERD (gastroesophageal reflux disease)    GERD (gastroesophageal reflux disease)    Hiatal hernia    Hyperglycemia 08/19/2013   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   Hyperlipidemia    Hypertension    Infertility, female    Low back pain 10/17/2007   Qualifier: Diagnosis of  By: [name], [name]    Measles as a child   Obesity    Osteoarthritis    Parkinson disease ([place])    Plantar fasciitis of left foot 06/19/2012   Preventative health care 12/19/2015   Rheumatoid arthritis ([place]) 01/08/2018   Rosacea 10/05/2014   Swallowing difficulty    Urinary frequency 02/25/2013   Visual floaters 05/11/2014    Past Surgical History:   Procedure Laterality Date   ABDOMINAL HYSTERECTOMY  2006   total   esophageal     stretching   laporoscopy     LEFT HEART CATH AND CORONARY ANGIOGRAPHY N/A 06/02/2019   Procedure: LEFT HEART CATH AND CORONARY ANGIOGRAPHY;  Surgeon: Jettie Booze, MD;  Location: [facility];  Service: Cardiovascular;  Laterality: N/A;   TONSILLECTOMY     TOTAL HIP ARTHROPLASTY Right 2010   wisdom teeth extracted      There were no vitals filed for this visit.   Subjective Assessment - 05/20/21 0855     Subjective Enjoyed the Power over Parkinson's group meeting the other day.  I think the meditation will be helpful.    Pertinent History See problem list    Patient Stated Goals To get back in the pool; to get back strength in my legs.    Currently in Pain? No/denies    Pain Onset More than a month ago                               [facility] Adult PT Treatment/Exercise - 05/20/21 0001       Transfers   Transfers Sit to Stand;Stand to Sit    Sit to Stand Without upper extremity assist;From chair/3-in-1;6: Modified independent (Device/Increase time)    Five time sit to stand comments  12.72    Stand to Sit 6: Modified independent (Device/Increase time);Without upper extremity assist;To chair/3-in-1      Ambulation/Gait   Ambulation/Gait Yes    Ambulation/Gait Assistance 6: Modified independent (Device/Increase time)    Ambulation/Gait Assistance Details single walking pole    Ambulation Distance (Feet) 1000 Feet    Assistive device Other (Comment)   single walking pole   Gait Pattern Step-through pattern;Decreased arm swing - right;Decreased step length - right;Decreased step length - left;Decreased stance time - right;Decreased weight shift to right;Narrow base of support    Ambulation Surface Level;Indoor;Unlevel;Outdoor    Gait velocity 10.28 sec= 3.19 ft/sec      Timed Up and Go Test   TUG Normal TUG;Cognitive TUG    Normal TUG (seconds) 12.03    Cognitive  TUG (seconds) 10.82      Mini-BESTest   Sit To Stand Normal: Comes to stand without use of hands and stabilizes independently.  Rise to Toes < 3 s.   1.6, 2.7 sec   Stand on one leg (left) Moderate: < 20 s   4.56, 6.96   Stand on one leg (right) Moderate: < 20 s   1.97, 3.28   Stand on one leg - lowest score 1    Compensatory Stepping Correction - Forward Normal: Recovers independently with a single, large step (second realignement is allowed).    Compensatory Stepping Correction - Backward Normal: Recovers independently with a single, large step    Compensatory Stepping Correction - Left Lateral Normal: Recovers independently with 1 step (crossover or lateral OK)    Compensatory Stepping Correction - Right Lateral Moderate: Several steps to recover equilibrium    Stepping Corredtion Lateral - lowest score 1    Stance - Feet together, eyes open, firm surface  Normal: 30s    Stance - Feet together, eyes closed, foam surface  Moderate: < 30s   5 sec at best   Incline - Eyes Closed Normal: Stands independently 30s and aligns with gravity    Change in Gait Speed Normal: Significantly changes walkling speed without imbalance    Walk with head turns - Horizontal Moderate: performs head turns with reduction in gait speed.    Walk with pivot turns Normal: Turns with feet close FAST (< 3 steps) with good balance.    Step over obstacles Moderate: Steps over box but touches box OR displays cautious behavior by slowing gait.    Timed UP & GO with Dual Task Normal: No noticeable change in sitting, standing or walking while backward counting when compared to TUG without    Mini-BEST total score 21      Self-Care   Self-Care Other Self-Care Comments    Other Self-Care Comments  Discussed progress towards goals, POC and plans for d/c this visit.  Discussed overall progress with therapy; discussed plans for continued community fitness and for return PT eval in 6 months (to be scheduled)                      PT Education - 05/20/21 1137     Education Details Progress towards goals, POC and plans for d/c.  Discussed likely PT return eval and OT/speech screens to be scheduled.    Person(s) Educated Patient    Methods Explanation    Comprehension Verbalized understanding              PT Short Term Goals - 04/18/21 1530       PT SHORT TERM GOAL #1   Title STGs = LTGs               PT Long Term Goals - 05/20/21 0858       PT LONG TERM GOAL #1   Title Pt will be independent with progression of/final HEP for improved functional mobility, strength, balance.  TARGET 05/20/2021    Baseline met for previously issued HEP, 04/18/21    Time 5    Period Weeks    Status Achieved      PT LONG TERM GOAL #2   Title Pt will improve 5x sit<>stand to less than or equal to 11.5 seconds for improved functional strength.    Baseline 12.12 sec 6/17; 12.47 sec 04/18/21; 12.72 sec 05/20/2021    Time 5    Period Weeks    Status Not Met      PT LONG TERM GOAL #3   Title Pt will improve MiniBESTest score to at  least 23/28 sec for decreased fall risk.    Baseline 20/28; 21/28 05/20/21    Time 5    Period Weeks    Status Not Met      PT LONG TERM GOAL #4   Title Pt will ambulate at least 1000 ft, indoor and outdoor surfaces, independently, no LOB for improved mobility in community.    Time 5    Period Weeks    Status Achieved      PT LONG TERM GOAL #5   Title Pt will verbalize plans for continued community fitness upon d/c from PT.    Baseline M:  swimming, T:  yoga at [place] (will try), W:  PWR! Moves, Th:  [place]/ACT, F:  PD cycling (to get back into): [employer] 3x/wk in the afternoons with husband    Time 5    Period Weeks    Status Achieved                   Plan - 05/20/21 1138     Clinical Impression Statement Assessed LTGs and pt has met 3 of 5 LTGs.  Pt has met LTG 1 for HEP, LTG 4 for long distance outdoor gait and LTG 5 for continued community fitness  plans.  LTG 2 not met with 5x sit<>stand about the same as last check at 12.72 sec; LTG 3 not met, with pt improving MiniBESTest score to 21/28, just not to goal level.  She has made significant progress during this bout of therapy, with overall decrease in pain, improved  functional strength, balance, improved confidence with gait and balance.  Pt does continue to experience some toe curling with gait and standing balance activities in R foot and have encouraged her to speak to Dr. Carles Collet about this (she is on the wait list for cancellation appt with Dr. Carles Collet).  She would benefit from return PT eval in 6 months for comprehensive look at functional strenght, gait and balance.    Personal Factors and Comorbidities Comorbidity 3+    Comorbidities See problem list    Examination-Activity Limitations Locomotion Level;Transfers;Squat;Stand    Examination-Participation Restrictions Community Activity;Shop;Other   Community fitness   Stability/Clinical Decision Making Evolving/Moderate complexity    Rehab Potential Good    PT Frequency 2x / week    PT Duration 4 weeks   per recert 2/84/1324, to include 04/18/21 visit, so 5 week   PT Treatment/Interventions ADLs/Self Care Home Management;Aquatic Therapy;Moist Heat;Ultrasound;Cryotherapy;Scientist, product/process development;Neuromuscular re-education;Balance training;Therapeutic exercise;Therapeutic activities;Functional mobility training;Gait training;Patient/family education;Manual techniques;Passive range of motion    PT Next Visit Plan Discharge visit today.  Recommend return PT eval in 6 months.  Also needs OT and speech screens scheduled.    Consulted and Agree with Plan of Care Patient             Patient will benefit from skilled therapeutic intervention in order to improve the following deficits and impairments:  Abnormal gait, Difficulty walking, Decreased range of motion, Pain, Decreased balance, Impaired flexibility, Decreased mobility, Decreased  strength, Postural dysfunction  Visit Diagnosis: Other abnormalities of gait and mobility  Unsteadiness on feet     Problem List Patient Active Problem List   Diagnosis Date Noted   Dry mouth 02/24/2021   Anemia 03/16/2020   Coronary artery disease involving native coronary artery of native heart without angina pectoris 06/12/2019   Abnormal stress test    Diastolic dysfunction 40/06/2724   Cough 05/10/2018   Rheumatoid arthritis ([place]) 01/08/2018   Hand pain, right  10/15/2017   Vitamin D deficiency 12/20/2016   Prediabetes 12/20/2016   Shortness of breath on exertion 11/27/2016   Preventative health care 12/19/2015   Constipation 12/07/2015   BCC (basal cell carcinoma of skin) 10/05/2014   Rosacea 10/05/2014   Parkinson's disease ([place]) 05/11/2014   Visual floaters 05/11/2014   Paralysis agitans ([place]) 01/08/2014   Screening for cervical cancer 07/17/2012   Foot pain, bilateral 06/19/2012   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 [place], [place], [address] Phone: [phone]   Fax:  [phone]  Name: Kristin [name] MRN: [id number] Date of Birth: October 03, 1950

## 2021-05-30 ENCOUNTER — Other Ambulatory Visit: Payer: Self-pay

## 2021-05-30 ENCOUNTER — Encounter: Payer: Self-pay | Admitting: Family Medicine

## 2021-05-30 ENCOUNTER — Ambulatory Visit (INDEPENDENT_AMBULATORY_CARE_PROVIDER_SITE_OTHER): Payer: Medicare Other | Admitting: Family Medicine

## 2021-05-30 VITALS — BP 108/66 | HR 81 | Temp 98.2°F | Resp 16 | Wt 218.8 lb

## 2021-05-30 DIAGNOSIS — R059 Cough, unspecified: Secondary | ICD-10-CM

## 2021-05-30 DIAGNOSIS — Z23 Encounter for immunization: Secondary | ICD-10-CM | POA: Diagnosis not present

## 2021-05-30 DIAGNOSIS — M069 Rheumatoid arthritis, unspecified: Secondary | ICD-10-CM | POA: Diagnosis not present

## 2021-05-30 DIAGNOSIS — R7303 Prediabetes: Secondary | ICD-10-CM

## 2021-05-30 DIAGNOSIS — K219 Gastro-esophageal reflux disease without esophagitis: Secondary | ICD-10-CM

## 2021-05-30 DIAGNOSIS — G20 Parkinson's disease: Secondary | ICD-10-CM | POA: Diagnosis not present

## 2021-05-30 DIAGNOSIS — I10 Essential (primary) hypertension: Secondary | ICD-10-CM | POA: Diagnosis not present

## 2021-05-30 DIAGNOSIS — K449 Diaphragmatic hernia without obstruction or gangrene: Secondary | ICD-10-CM | POA: Diagnosis not present

## 2021-05-30 DIAGNOSIS — E782 Mixed hyperlipidemia: Secondary | ICD-10-CM

## 2021-05-30 DIAGNOSIS — E538 Deficiency of other specified B group vitamins: Secondary | ICD-10-CM

## 2021-05-30 DIAGNOSIS — G20A1 Parkinson's disease without dyskinesia, without mention of fluctuations: Secondary | ICD-10-CM

## 2021-05-30 DIAGNOSIS — E559 Vitamin D deficiency, unspecified: Secondary | ICD-10-CM | POA: Diagnosis not present

## 2021-05-30 LAB — LIPID PANEL
Cholesterol: 158 mg/dL (ref 0–200)
HDL: 70.4 mg/dL (ref >39.00)
LDL Cholesterol: 67 mg/dL (ref 0–99)
NonHDL: 88.02
Total CHOL/HDL Ratio: 2
Triglycerides: 106 mg/dL (ref 0.0–149.0)
VLDL: 21.2 mg/dL (ref 0.0–40.0)

## 2021-05-30 LAB — CBC
HCT: 37.8 % (ref 36.0–46.0)
Hemoglobin: 12.3 g/dL (ref 12.0–15.0)
MCHC: 32.5 g/dL (ref 30.0–36.0)
MCV: 89.9 fl (ref 78.0–100.0)
Platelets: 204 10*3/uL (ref 150.0–400.0)
RBC: 4.2 Mil/uL (ref 3.87–5.11)
RDW: 16 % — ABNORMAL HIGH (ref 11.5–15.5)
WBC: 5.7 10*3/uL (ref 4.0–10.5)

## 2021-05-30 LAB — HEMOGLOBIN A1C: Hgb A1c MFr Bld: 5.8 % (ref 4.6–6.5)

## 2021-05-30 LAB — COMPREHENSIVE METABOLIC PANEL
ALT: 5 U/L (ref 0–35)
AST: 16 U/L (ref 0–37)
Albumin: 4.2 g/dL (ref 3.5–5.2)
Alkaline Phosphatase: 78 U/L (ref 39–117)
BUN: 16 mg/dL (ref 6–23)
CO2: 29 mEq/L (ref 19–32)
Calcium: 9.4 mg/dL (ref 8.4–10.5)
Chloride: 105 mEq/L (ref 96–112)
Creatinine, Ser: 0.74 mg/dL (ref 0.40–1.20)
GFR: 81.61 mL/min (ref >60.00)
Glucose, Bld: 90 mg/dL (ref 70–99)
Potassium: 4.4 mEq/L (ref 3.5–5.1)
Sodium: 141 mEq/L (ref 135–145)
Total Bilirubin: 0.7 mg/dL (ref 0.2–1.2)
Total Protein: 6.4 g/dL (ref 6.0–8.3)

## 2021-05-30 LAB — VITAMIN D 25 HYDROXY (VIT D DEFICIENCY, FRACTURES): VITD: 22.5 ng/mL — ABNORMAL LOW (ref 30.00–100.00)

## 2021-05-30 LAB — VITAMIN B12: Vitamin B-12: 1027 pg/mL — ABNORMAL HIGH (ref 211–911)

## 2021-05-30 LAB — TSH: TSH: 1.27 u[IU]/mL (ref 0.35–5.50)

## 2021-05-30 MED ORDER — OMEPRAZOLE 20 MG PO CPDR: 20.0000 mg | DELAYED_RELEASE_CAPSULE | Freq: Every day | ORAL | 3 refills | Status: DC | PRN

## 2021-05-30 NOTE — Assessment & Plan Note (Signed)
Doing well and following with neurology

## 2021-05-30 NOTE — Assessment & Plan Note (Signed)
Encourage heart healthy diet such as MIND or DASH diet, increase exercise, avoid trans fats, simple carbohydrates and processed foods, consider a krill or fish or flaxseed oil cap daily.  °

## 2021-05-30 NOTE — Assessment & Plan Note (Signed)
Underwent surgery in the spring and she is doing much better she will start to wean down on her PPI and she may be able to come off

## 2021-05-30 NOTE — Assessment & Plan Note (Signed)
hgba1c acceptable, minimize simple carbs. Increase exercise as tolerated.  

## 2021-05-30 NOTE — Progress Notes (Signed)
Subjective:   By signing my name below, I, Zite Okoli, attest that this documentation has been prepared under the direction and in the presence of Mosie Lukes, MD. 05/30/2021    Patient ID: Kristin [name], female    DOB: 1950-09-12, 70 y.o.   MRN: [id number]  No chief complaint on file.   HPI Patient is in today for an office visit.  She had a surgery in April 2022 and reports she has been feeling okay.  She has been having right leg pain and she had different diagnoses from different doctors. She just finished physical therapy and reports her gait is better and the pain has been resolved.   She is also requesting to decrease the dosage of omeprazole from 40 mg to 20 mg because she has not had heartburns as frequently.  She is also unsure of having rheumatoid arthritis because she has not been having symptoms and has an appointment to meet with a rheumatologist at [facility]. Her rheumatoid factor was 20 at her last lab work and she is managing it with methotrexate 250 mg/10 ml injections.   She is also worried because her hair has been falling out and would like medication to help.  She is consistently taking vitamin B12 pills but is not taking vitamin D pills.  She had Covid-19 in July 2022 and does not having any residual symptoms. She has 4 Pfizer Covid-19 vaccines at this time.   She is willing to get the flu vaccine today.    Past Medical History:  Diagnosis Date   Abnormal vaginal Pap smear    Acute pharyngitis 02/25/2013   Anemia    Anxiety    Arthritis    BCC (basal cell carcinoma of skin) 10/05/2014   On back   Chicken pox as a child   Chronic UTI    sees dr Terance Hart   Constipation 12/07/2015   Depression with anxiety 11/02/2009   Qualifier: Diagnosis of  By: [name], LPN, Bonnye M    Dermatitis 07/17/2012   Esophageal stricture 1994   Fibroids    Foot pain, bilateral 06/19/2012   GERD (gastroesophageal reflux disease)    GERD (gastroesophageal reflux disease)     Hiatal hernia    Hyperglycemia 08/19/2013   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   Hyperlipidemia    Hypertension    Infertility, female    Low back pain 10/17/2007   Qualifier: Diagnosis of  By: [name], [name]    Measles as a child   Obesity    Osteoarthritis    Parkinson disease ([place])    Plantar fasciitis of left foot 06/19/2012   Preventative health care 12/19/2015   Rheumatoid arthritis ([place]) 01/08/2018   Rosacea 10/05/2014   Swallowing difficulty    Urinary frequency 02/25/2013   Visual floaters 05/11/2014    Past Surgical History:  Procedure Laterality Date   ABDOMINAL HYSTERECTOMY  2006   total   esophageal     stretching   HERNIA REPAIR N/A    laporoscopy     LEFT HEART CATH AND CORONARY ANGIOGRAPHY N/A 06/02/2019   Procedure: LEFT HEART CATH AND CORONARY ANGIOGRAPHY;  Surgeon: Jettie Booze, MD;  Location: [facility];  Service: Cardiovascular;  Laterality: N/A;   TONSILLECTOMY     TOTAL HIP ARTHROPLASTY Right 2010   wisdom teeth extracted      Family History  Problem Relation Age of Onset   Cancer Mother        breast  Other Mother        arrythmia   Mental illness Mother        bipolar   Hyperlipidemia Mother    Thyroid disease Mother    Depression Mother    Bipolar disorder Mother    Breast cancer Mother 79   Heart disease Father    Arthritis Father        rheumatoid   Hypertension Father    Hyperlipidemia Father    Depression Sister    Mental illness Sister        bipolar   Parkinson's disease Sister    Arthritis Sister    Arthritis Sister    Arthritis Sister    Breast cancer Maternal Aunt 52   Breast cancer Maternal Uncle    Colon cancer Neg Hx    Esophageal cancer Neg Hx    Rectal cancer Neg Hx    Stomach cancer Neg Hx     Social History   Socioeconomic History   Marital status: Married    Spouse name: Not on file   Number of children: 1   Years of education: Not on file   Highest education level: Master's degree (e.g.,  MA, MS, MEng, MEd, MSW, MBA)  Occupational History   Occupation: Retired Teacher, music  Tobacco Use   Smoking status: Never   Smokeless tobacco: Never  Vaping Use   Vaping Use: Never used  Substance and Sexual Activity   Alcohol use: Not Currently   Drug use: No   Sexual activity: Not Currently    Partners: Male  Other Topics Concern   Not on file  Social History Narrative   Lives with husband, no major dietary restrictions, retired from teaching      Husband dx with cancer MDS June 2017.   Currently in remission. (11/03/16 pc)      Pt has one biological child the other 3 are adopted      Social Determinants of Health   Financial Resource Strain: Low Risk    Difficulty of Paying Living Expenses: Not hard at all  Food Insecurity: No Food Insecurity   Worried About Charity fundraiser in the Last Year: Never true   [place] in the Last Year: Never true  Transportation Needs: No Transportation Needs   Lack of Transportation (Medical): No   Lack of Transportation (Non-Medical): No  Physical Activity: Sufficiently Active   Days of Exercise per Week: 6 days   Minutes of Exercise per Session: 40 min  Stress: No Stress Concern Present   Feeling of Stress : Not at all  Social Connections: Moderately Integrated   Frequency of Communication with Friends and Family: More than three times a week   Frequency of Social Gatherings with Friends and Family: Once a week   Attends Religious Services: 1 to 4 times per year   Active Member of [employer] or Organizations: No   Attends Archivist Meetings: Never   Marital Status: Married  Human resources officer Violence: Not At Risk   Fear of Current or Ex-Partner: No   Emotionally Abused: No   Physically Abused: No   Sexually Abused: No    Outpatient Medications Prior to Visit  Medication Sig Dispense Refill   carbidopa-levodopa (SINEMET CR) 50-200 MG tablet TAKE 1 TABLET BY MOUTH EVERYDAY AT BEDTIME 90 tablet 2    carbidopa-levodopa (SINEMET IR) 25-100 MG tablet TAKE 2 TABLETS BY MOUTH AT 7AM, 2TABS AT 11AM, 1TAB AT 4PM 450 tablet 1  carvedilol (COREG) 3.125 MG tablet TAKE 1 TABLET BY MOUTH 2 TIMES DAILY WITH A MEAL. 180 tablet 1   DULoxetine (CYMBALTA) 30 MG capsule TAKE 1 CAPSULE BY MOUTH EVERY DAY 90 capsule 1   escitalopram (LEXAPRO) 20 MG tablet TAKE 1 TABLET BY MOUTH EVERY DAY 90 tablet 1   folic acid (FOLVITE) 1 MG tablet Take 1 mg by mouth 3 (three) times daily.      hydroxychloroquine (PLAQUENIL) 200 MG tablet Take 2 tablets (400 mg total) by mouth daily.     losartan (COZAAR) 50 MG tablet TAKE 1 TABLET BY MOUTH EVERY DAY 90 tablet 1   methotrexate 250 MG/10ML injection methotrexate sodium 25 mg/mL injection solution  INJECT 0.8MLS SUBCUTANEOUSLY EVERY 7 DAYS     polyethylene glycol (MIRALAX / GLYCOLAX) 17 g packet Take 17 g by mouth every other day.     pramipexole (MIRAPEX) 0.5 MG tablet Take 1 tablet (0.5 mg total) by mouth 3 (three) times daily. 270 tablet 2   rosuvastatin (CRESTOR) 10 MG tablet TAKE 1 TABLET BY MOUTH ON MONDAYS, WEDNESDAYS AND FRIDAYS 54 tablet 1   solifenacin (VESICARE) 5 MG tablet Take 5 mg by mouth daily.     No facility-administered medications prior to visit.    Allergies  Allergen Reactions   Prednisone Dermatitis    Facial ulcers   Bactrim [Sulfamethoxazole-Trimethoprim] Other (See Comments)    Oral ulcers and rash   Pb-Hyoscy-Atropine-Scopolamine [Phenobarbital-Belladonna Alk] Other (See Comments)    Panic and hallucinations   Penicillins Hives    Did it involve swelling of the face/tongue/throat, SOB, or low BP? No Did it involve sudden or severe rash/hives, skin peeling, or any reaction on the inside of your mouth or nose? No Did you need to seek medical attention at a hospital or doctor's office? No When did it last happen?      30+ years ago If all above answers are "NO", may proceed with cephalosporin use.    Scopolamine Other (See Comments)     Panic hallu    Review of Systems  Constitutional:  Negative for fever and malaise/fatigue.  HENT:  Negative for congestion.   Eyes:  Negative for redness.  Respiratory:  Negative for shortness of breath.   Cardiovascular:  Negative for chest pain, palpitations and leg swelling.  Gastrointestinal:  Negative for abdominal pain, blood in stool and nausea.  Genitourinary:  Negative for dysuria and frequency.  Musculoskeletal:  Negative for falls.  Skin:  Negative for rash.  Neurological:  Negative for dizziness, loss of consciousness and headaches.  Endo/Heme/Allergies:  Negative for polydipsia.  Psychiatric/Behavioral:  Negative for depression. The patient is not nervous/anxious.       Objective:    Physical Exam Constitutional:      General: She is not in acute distress.    Appearance: She is well-developed.  HENT:     Head: Normocephalic and atraumatic.  Eyes:     Conjunctiva/sclera: Conjunctivae normal.  Neck:     Thyroid: No thyromegaly.  Cardiovascular:     Rate and Rhythm: Normal rate and regular rhythm.     Heart sounds: Normal heart sounds. No murmur heard. Pulmonary:     Effort: Pulmonary effort is normal. No respiratory distress.     Breath sounds: Normal breath sounds.  Abdominal:     General: Bowel sounds are normal. There is no distension.     Palpations: Abdomen is soft. There is no mass.     Tenderness: There is no abdominal  tenderness.  Musculoskeletal:     Cervical back: Neck supple.  Lymphadenopathy:     Cervical: No cervical adenopathy.  Skin:    General: Skin is warm and dry.  Neurological:     Mental Status: She is alert and oriented to person, place, and time.  Psychiatric:        Behavior: Behavior normal.    BP 108/66   Pulse 81   Temp 98.2 F (36.8 C)   Resp 16   Wt 218 lb 12.8 oz (99.2 kg)   SpO2 97%   BMI 38.76 kg/m  Wt Readings from Last 3 Encounters:  05/30/21 218 lb 12.8 oz (99.2 kg)  04/12/21 217 lb 6.4 oz (98.6 kg)  12/10/20  235 lb (106.6 kg)    Diabetic Foot Exam - Simple   No data filed    Lab Results  Component Value Date   WBC 5.7 05/30/2021   HGB 12.3 05/30/2021   HCT 37.8 05/30/2021   PLT 204.0 05/30/2021   GLUCOSE 90 05/30/2021   CHOL 158 05/30/2021   TRIG 106.0 05/30/2021   HDL 70.40 05/30/2021   LDLDIRECT 161.5 10/07/2008   LDLCALC 67 05/30/2021   ALT 5 05/30/2021   AST 16 05/30/2021   NA 141 05/30/2021   K 4.4 05/30/2021   CL 105 05/30/2021   CREATININE 0.74 05/30/2021   BUN 16 05/30/2021   CO2 29 05/30/2021   TSH 1.27 05/30/2021   INR 2.5 (H) 01/19/2009   HGBA1C 5.8 05/30/2021    Lab Results  Component Value Date   TSH 1.27 05/30/2021   Lab Results  Component Value Date   WBC 5.7 05/30/2021   HGB 12.3 05/30/2021   HCT 37.8 05/30/2021   MCV 89.9 05/30/2021   PLT 204.0 05/30/2021   Lab Results  Component Value Date   NA 141 05/30/2021   K 4.4 05/30/2021   CO2 29 05/30/2021   GLUCOSE 90 05/30/2021   BUN 16 05/30/2021   CREATININE 0.74 05/30/2021   BILITOT 0.7 05/30/2021   ALKPHOS 78 05/30/2021   AST 16 05/30/2021   ALT 5 05/30/2021   PROT 6.4 05/30/2021   ALBUMIN 4.2 05/30/2021   CALCIUM 9.4 05/30/2021   ANIONGAP 14 12/10/2020   GFR 81.61 05/30/2021   Lab Results  Component Value Date   CHOL 158 05/30/2021   Lab Results  Component Value Date   HDL 70.40 05/30/2021   Lab Results  Component Value Date   LDLCALC 67 05/30/2021   Lab Results  Component Value Date   TRIG 106.0 05/30/2021   Lab Results  Component Value Date   CHOLHDL 2 05/30/2021   Lab Results  Component Value Date   HGBA1C 5.8 05/30/2021       Assessment & Plan:   Problem List Items Addressed This Visit     Hyperlipidemia - Primary    Encourage heart healthy diet such as MIND or DASH diet, increase exercise, avoid trans fats, simple carbohydrates and processed foods, consider a krill or fish or flaxseed oil cap daily.       Relevant Orders   Lipid panel (Completed)    Essential hypertension   Relevant Orders   CBC (Completed)   Comprehensive metabolic panel (Completed)   TSH (Completed)   Hiatal hernia with GERD    Underwent surgery in the spring and she is doing much better she will start to wean down on her PPI and she may be able to come off  Relevant Medications   omeprazole (PRILOSEC) 20 MG capsule   Parkinson's disease ([place])    Doing well and following with neurology      Vitamin D deficiency    Supplement and monitor      Relevant Orders   VITAMIN D 25 Hydroxy (Vit-D Deficiency, Fractures) (Completed)   Prediabetes    hgba1c acceptable, minimize simple carbs. Increase exercise as tolerated.       Relevant Orders   Hemoglobin A1c (Completed)   Rheumatoid arthritis ([place])    Sees Rheumatology at [facility] she had a RF in 2019 which was 20. She is taking MTX injections.  She is worried they are contributing to hair loss. She will discuss with rheumatology when she see them soo      Cough   Vitamin B 12 deficiency    Supplement and monitor      Relevant Orders   Vitamin B12 (Completed)   Other Visit Diagnoses     Need for influenza vaccination       Relevant Orders   Flu Vaccine QUAD High Dose(Fluad) (Completed)       Meds ordered this encounter  Medications   omeprazole (PRILOSEC) 20 MG capsule    Sig: Take 1 capsule (20 mg total) by mouth daily as needed.    Dispense:  30 capsule    Refill:  3    I,Zite Okoli,acting as a scribe for [name], MD.,have documented all relevant documentation on the behalf of [name], MD,as directed by  [name], MD while in the presence of [name], MD.   I, Mosie Lukes, MD. personally preformed the services described in this documentation.  All medical record entries made by the scribe were at my direction and in my presence.  I have reviewed the chart and discharge instructions (if applicable) and agree that the record reflects my personal performance and is accurate  and complete. 05/30/2021

## 2021-05-30 NOTE — Patient Instructions (Addendum)
Switch the Vitamin B12 to sublingual (under tongue) version 1000 mcg daily when you get a new bottle  Multivitamins with Minerals Magnesium Glycinate 200-400 mg at bedtime for sleep Vitamin D Deficiency Vitamin D deficiency is when your body does not have enough vitamin D. Vitamin D is important to your body for many reasons: It helps the body absorb two important minerals--calcium and phosphorus. It plays a role in bone health. It may help to prevent some diseases, such as diabetes and multiple sclerosis. It plays a role in muscle function, including heart function. If vitamin D deficiency is severe, it can cause a condition in which your bones become soft. In adults, this condition is called osteomalacia. In children, this condition is called rickets. What are the causes? This condition may be caused by: Not eating enough foods that contain vitamin D. Not getting enough natural sun exposure. Having certain digestive system diseases that make it difficult for your body to absorb vitamin D. These diseases include Crohn's disease, chronic pancreatitis, and cystic fibrosis. Having a surgery in which a part of the stomach or a part of the small intestine is removed. Having chronic kidney disease or liver disease. What increases the risk? You are more likely to develop this condition if you: Are older. Do not spend much time outdoors. Live in a long-term care facility. Have had broken bones. Have weak or thin bones (osteoporosis). Have a disease or condition that changes how the body absorbs vitamin D. Have dark skin. Take certain medicines, such as steroid medicines or certain seizure medicines. Are overweight or obese. What are the signs or symptoms? In mild cases of vitamin D deficiency, there may not be any symptoms. If the condition is severe, symptoms may include: Bone pain. Muscle pain. Falling often. Broken bones caused by a minor injury. How is this diagnosed? This condition  may be diagnosed with blood tests. Imaging tests such as X-rays may also be done to look for changes in the bone. How is this treated? Treatment for this condition may depend on what caused the condition. Treatment options include: Taking vitamin D supplements. Your health care provider will suggest what dose is best for you. Taking a calcium supplement. Your health care provider will suggest what dose is best for you. Follow these instructions at home: Eating and drinking  Eat foods that contain vitamin D. Choices include: Fortified dairy products, cereals, or juices. Fortified means that vitamin D has been added to the food. Check the label on the package to see if the food is fortified. Fatty fish, such as salmon or trout. Eggs. Oysters. Mushrooms. The items listed above may not be a complete list of recommended foods and beverages. Contact a dietitian for more information. General instructions Take medicines and supplements only as told by your health care provider. Get regular, safe exposure to natural sunlight. Do not use a tanning bed. Maintain a healthy weight. Lose weight if needed. Keep all follow-up visits as told by your health care provider. This is important. How is this prevented? You can get vitamin D by: Eating foods that naturally contain vitamin D. Eating or drinking products that have been fortified with vitamin D, such as cereals, juices, and dairy products (including milk). Taking a vitamin D supplement or a multivitamin supplement that contains vitamin D. Being in the sun. Your body naturally makes vitamin D when your skin is exposed to sunlight. Your body changes the sunlight into a form of the vitamin that it can use.  Contact a health care provider if: Your symptoms do not go away. You feel nauseous or you vomit. You have fewer bowel movements than usual or are constipated. Summary Vitamin D deficiency is when your body does not have enough vitamin D. Vitamin  D is important to your body for good bone health and muscle function, and it may help prevent some diseases. Vitamin D deficiency is primarily treated through supplementation. Your health care provider will suggest what dose is best for you. You can get vitamin D by eating foods that contain vitamin D, by being in the sun, and by taking a vitamin D supplement or a multivitamin supplement that contains vitamin D. This information is not intended to replace advice given to you by your health care provider. Make sure you discuss any questions you have with your health care provider. Document Revised: 04/29/2018 Document Reviewed: 04/29/2018 Elsevier Patient Education  [place].

## 2021-05-30 NOTE — Assessment & Plan Note (Addendum)
Sees Rheumatology at [facility] she had a RF in 2019 which was 20. She is taking MTX injections.  She is worried they are contributing to hair loss. She will discuss with rheumatology when she see them soo

## 2021-05-30 NOTE — Assessment & Plan Note (Signed)
Supplement and monitor 

## 2021-05-31 MED ORDER — VITAMIN D (ERGOCALCIFEROL) 1.25 MG (50000 UNIT) PO CAPS: 50000.0000 [IU] | ORAL_CAPSULE | ORAL | 0 refills | Status: DC

## 2021-05-31 NOTE — Addendum Note (Signed)
Addended byDamita Dunnings D on: 05/31/2021 09:19 AM   Modules accepted: Orders

## 2021-06-01 DIAGNOSIS — M059 Rheumatoid arthritis with rheumatoid factor, unspecified: Secondary | ICD-10-CM | POA: Diagnosis not present

## 2021-06-01 DIAGNOSIS — Z23 Encounter for immunization: Secondary | ICD-10-CM | POA: Diagnosis not present

## 2021-06-01 DIAGNOSIS — M0609 Rheumatoid arthritis without rheumatoid factor, multiple sites: Secondary | ICD-10-CM | POA: Diagnosis not present

## 2021-06-01 DIAGNOSIS — G20 Parkinson's disease: Secondary | ICD-10-CM | POA: Diagnosis not present

## 2021-06-01 DIAGNOSIS — Z79899 Other long term (current) drug therapy: Secondary | ICD-10-CM | POA: Diagnosis not present

## 2021-06-01 DIAGNOSIS — R0609 Other forms of dyspnea: Secondary | ICD-10-CM | POA: Diagnosis not present

## 2021-06-01 DIAGNOSIS — M949 Disorder of cartilage, unspecified: Secondary | ICD-10-CM | POA: Diagnosis not present

## 2021-06-15 ENCOUNTER — Ambulatory Visit: Payer: Medicare Other | Attending: Internal Medicine

## 2021-06-15 DIAGNOSIS — Z23 Encounter for immunization: Secondary | ICD-10-CM

## 2021-06-16 ENCOUNTER — Other Ambulatory Visit (HOSPITAL_BASED_OUTPATIENT_CLINIC_OR_DEPARTMENT_OTHER): Payer: Self-pay

## 2021-06-16 MED ORDER — PFIZER COVID-19 VAC BIVALENT 30 MCG/0.3ML IM SUSP
INTRAMUSCULAR | 0 refills | Status: DC
  Filled 2021-06-16: qty 0.3, 1d supply, fill #0

## 2021-06-16 NOTE — Progress Notes (Signed)
   Covid-19 Vaccination Clinic  Name:  LOURDES KUCHARSKI    MRN: [id number] DOB: May 05, 1951  06/16/2021  Ms. Veloso was observed post Covid-19 immunization for 15 minutes without incident. She was provided with Vaccine Information Sheet and instruction to access the V-Safe system.   Ms. Acoff was instructed to call 911 with any severe reactions post vaccine: Difficulty breathing  Swelling of face and throat  A fast heartbeat  A bad rash all over body  Dizziness and weakness

## 2021-06-21 ENCOUNTER — Other Ambulatory Visit: Payer: Self-pay | Admitting: Family Medicine

## 2021-06-23 ENCOUNTER — Other Ambulatory Visit: Payer: Self-pay | Admitting: Family Medicine

## 2021-06-24 ENCOUNTER — Other Ambulatory Visit: Payer: Self-pay

## 2021-06-27 DIAGNOSIS — Z79899 Other long term (current) drug therapy: Secondary | ICD-10-CM | POA: Diagnosis not present

## 2021-06-27 DIAGNOSIS — H524 Presbyopia: Secondary | ICD-10-CM | POA: Diagnosis not present

## 2021-06-27 DIAGNOSIS — H5203 Hypermetropia, bilateral: Secondary | ICD-10-CM | POA: Diagnosis not present

## 2021-06-28 ENCOUNTER — Telehealth: Payer: Self-pay | Admitting: Family Medicine

## 2021-06-28 NOTE — Telephone Encounter (Signed)
Medication in on 06/23/21

## 2021-06-28 NOTE — Telephone Encounter (Signed)
Medication:  rosuvastatin (CRESTOR) 10 MG tablet    Has the patient contacted their pharmacy? Yes.   (If no, request that the patient contact the pharmacy for the refill.) (If yes, when and what did the pharmacy advise?) Pt contacted pharmacy who advised her to reach out to pcp.    Preferred Pharmacy (with phone number or street name):  [id number] - [place], [place]. [address]  [address], [place] [address]  Phone:  [phone]  Fax:  [phone]

## 2021-07-01 ENCOUNTER — Other Ambulatory Visit: Payer: Self-pay | Admitting: Family Medicine

## 2021-07-05 ENCOUNTER — Other Ambulatory Visit: Payer: Self-pay | Admitting: Family Medicine

## 2021-07-05 MED ORDER — ROSUVASTATIN CALCIUM 10 MG PO TABS: ORAL_TABLET | ORAL | 1 refills | Status: DC

## 2021-07-05 NOTE — Telephone Encounter (Signed)
Pt called back regarding medication refill. Per prescription on 10/20, states transmission to pharmacy failed. Please advise.

## 2021-07-05 NOTE — Telephone Encounter (Signed)
Rx resent and patient notified.

## 2021-07-08 ENCOUNTER — Other Ambulatory Visit: Payer: Self-pay | Admitting: Family Medicine

## 2021-07-26 ENCOUNTER — Other Ambulatory Visit: Payer: Self-pay | Admitting: Neurology

## 2021-07-26 DIAGNOSIS — G20 Parkinson's disease: Secondary | ICD-10-CM

## 2021-07-26 DIAGNOSIS — G20A1 Parkinson's disease without dyskinesia, without mention of fluctuations: Secondary | ICD-10-CM

## 2021-09-10 ENCOUNTER — Other Ambulatory Visit: Payer: Self-pay | Admitting: Family Medicine

## 2021-09-15 ENCOUNTER — Other Ambulatory Visit: Payer: Self-pay | Admitting: Family Medicine

## 2021-09-20 ENCOUNTER — Ambulatory Visit: Payer: Medicare Other | Admitting: Neurology

## 2021-09-24 ENCOUNTER — Other Ambulatory Visit: Payer: Self-pay | Admitting: Neurology

## 2021-09-24 DIAGNOSIS — G20 Parkinson's disease: Secondary | ICD-10-CM

## 2021-09-24 DIAGNOSIS — G20A1 Parkinson's disease without dyskinesia, without mention of fluctuations: Secondary | ICD-10-CM

## 2021-10-03 ENCOUNTER — Ambulatory Visit (INDEPENDENT_AMBULATORY_CARE_PROVIDER_SITE_OTHER): Payer: Medicare Other | Admitting: Family Medicine

## 2021-10-03 VITALS — BP 114/72 | HR 94 | Temp 97.9°F | Resp 16 | Ht 63.0 in | Wt 232.0 lb

## 2021-10-03 DIAGNOSIS — R7303 Prediabetes: Secondary | ICD-10-CM

## 2021-10-03 DIAGNOSIS — R142 Eructation: Secondary | ICD-10-CM | POA: Diagnosis not present

## 2021-10-03 DIAGNOSIS — K219 Gastro-esophageal reflux disease without esophagitis: Secondary | ICD-10-CM | POA: Diagnosis not present

## 2021-10-03 DIAGNOSIS — E782 Mixed hyperlipidemia: Secondary | ICD-10-CM

## 2021-10-03 DIAGNOSIS — K2960 Other gastritis without bleeding: Secondary | ICD-10-CM | POA: Diagnosis not present

## 2021-10-03 DIAGNOSIS — K449 Diaphragmatic hernia without obstruction or gangrene: Secondary | ICD-10-CM

## 2021-10-03 DIAGNOSIS — R0789 Other chest pain: Secondary | ICD-10-CM

## 2021-10-03 DIAGNOSIS — G20 Parkinson's disease: Secondary | ICD-10-CM

## 2021-10-03 DIAGNOSIS — M069 Rheumatoid arthritis, unspecified: Secondary | ICD-10-CM

## 2021-10-03 DIAGNOSIS — K5909 Other constipation: Secondary | ICD-10-CM

## 2021-10-03 DIAGNOSIS — I10 Essential (primary) hypertension: Secondary | ICD-10-CM | POA: Diagnosis not present

## 2021-10-03 DIAGNOSIS — Z862 Personal history of diseases of the blood and blood-forming organs and certain disorders involving the immune mechanism: Secondary | ICD-10-CM | POA: Diagnosis not present

## 2021-10-03 DIAGNOSIS — F418 Other specified anxiety disorders: Secondary | ICD-10-CM

## 2021-10-03 DIAGNOSIS — R140 Abdominal distension (gaseous): Secondary | ICD-10-CM

## 2021-10-03 DIAGNOSIS — E559 Vitamin D deficiency, unspecified: Secondary | ICD-10-CM | POA: Diagnosis not present

## 2021-10-03 DIAGNOSIS — E538 Deficiency of other specified B group vitamins: Secondary | ICD-10-CM | POA: Diagnosis not present

## 2021-10-03 DIAGNOSIS — I2510 Atherosclerotic heart disease of native coronary artery without angina pectoris: Secondary | ICD-10-CM

## 2021-10-03 LAB — CBC
HCT: 38.1 % (ref 36.0–46.0)
Hemoglobin: 12.5 g/dL (ref 12.0–15.0)
MCHC: 32.7 g/dL (ref 30.0–36.0)
MCV: 91.7 fl (ref 78.0–100.0)
Platelets: 200 10*3/uL (ref 150.0–400.0)
RBC: 4.16 Mil/uL (ref 3.87–5.11)
RDW: 13.9 % (ref 11.5–15.5)
WBC: 6.1 10*3/uL (ref 4.0–10.5)

## 2021-10-03 LAB — LIPID PANEL
Cholesterol: 158 mg/dL (ref 0–200)
HDL: 62.7 mg/dL (ref >39.00)
LDL Cholesterol: 75 mg/dL (ref 0–99)
NonHDL: 94.94
Total CHOL/HDL Ratio: 3
Triglycerides: 99 mg/dL (ref 0.0–149.0)
VLDL: 19.8 mg/dL (ref 0.0–40.0)

## 2021-10-03 LAB — TSH: TSH: 1.2 u[IU]/mL (ref 0.35–5.50)

## 2021-10-03 LAB — COMPREHENSIVE METABOLIC PANEL
ALT: 10 U/L (ref 0–35)
AST: 20 U/L (ref 0–37)
Albumin: 4.3 g/dL (ref 3.5–5.2)
Alkaline Phosphatase: 80 U/L (ref 39–117)
BUN: 19 mg/dL (ref 6–23)
CO2: 29 mEq/L (ref 19–32)
Calcium: 9.5 mg/dL (ref 8.4–10.5)
Chloride: 104 mEq/L (ref 96–112)
Creatinine, Ser: 0.76 mg/dL (ref 0.40–1.20)
GFR: 78.84 mL/min (ref >60.00)
Glucose, Bld: 95 mg/dL (ref 70–99)
Potassium: 4.3 mEq/L (ref 3.5–5.1)
Sodium: 141 mEq/L (ref 135–145)
Total Bilirubin: 0.6 mg/dL (ref 0.2–1.2)
Total Protein: 6.6 g/dL (ref 6.0–8.3)

## 2021-10-03 LAB — VITAMIN D 25 HYDROXY (VIT D DEFICIENCY, FRACTURES): VITD: 42.1 ng/mL (ref 30.00–100.00)

## 2021-10-03 LAB — VITAMIN B12: Vitamin B-12: 338 pg/mL (ref 211–911)

## 2021-10-03 LAB — HEMOGLOBIN A1C: Hgb A1c MFr Bld: 6 % (ref 4.6–6.5)

## 2021-10-03 MED ORDER — OMEPRAZOLE 20 MG PO CPDR: 20.0000 mg | DELAYED_RELEASE_CAPSULE | Freq: Two times a day (BID) | ORAL | 1 refills | Status: DC | PRN

## 2021-10-03 NOTE — Assessment & Plan Note (Signed)
She notes increased anxiety recently and is worried about a FH that includes some bipolar. Has not been a lifelong struggle she is encouraged to go back and discuss with trusted loved ones and if they are concerned can refer to psychiatry for further consideration. Anxiety largely a result of her struggle with Parkinson's most likely

## 2021-10-03 NOTE — Assessment & Plan Note (Signed)
Continues to follow with neurology but is also noting increased anxiety recently. She will let us know if that gets worse and she is ready to address it directly

## 2021-10-03 NOTE — Assessment & Plan Note (Signed)
Supplement and monitor 

## 2021-10-03 NOTE — Patient Instructions (Signed)
Nonspecific Chest Pain, Adult °Chest pain is an uncomfortable, tight, or painful feeling in the chest. The pain can feel like a crushing, aching, or squeezing pressure. A person can feel a burning or tingling sensation. Chest pain can also be felt in your back, neck, jaw, shoulder, or arm. This pain can be worse when you move, sneeze, or take a deep breath. °Chest pain can be caused by a condition that is life-threatening. This must be treated right away. It can also be caused by something that is not life-threatening. If you have chest pain, it can be hard to know the difference, so it is important to get help right away to make sure that you do not have a serious condition. °Some life-threatening causes of chest pain include: °Heart attack. °A tear in the body's main blood vessel (aortic dissection). °Inflammation around your heart (pericarditis). °A problem in the lungs, such as a blood clot (pulmonary embolism) or a collapsed lung (pneumothorax). °Some non life-threatening causes of chest pain include: °Heartburn. °Anxiety or stress. °Damage to the bones, muscles, and cartilage that make up your chest wall. °Pneumonia or bronchitis. °Shingles infection (varicella-zoster virus). °Your chest pain may come and go. It may also be constant. Your health care provider will do tests and other studies to find the cause of your pain. Treatment will depend on the cause of your chest pain. °Follow these instructions at home: °Medicines °Take over-the-counter and prescription medicines only as told by your health care provider. °If you were prescribed an antibiotic medicine, take it as told by your health care provider. Do not stop taking the antibiotic even if you start to feel better. °Activity °Avoid any activities that cause chest pain. °Do not lift anything that is heavier than 10 lb (4.5 kg), or the limit that you are told, until your health care provider says that it is safe. °Rest as directed by your health care  provider. °Return to your normal activities only as told by your health care provider. Ask your health care provider what activities are safe for you. °Lifestyle °  °Do not use any products that contain nicotine or tobacco, such as cigarettes, e-cigarettes, and chewing tobacco. If you need help quitting, ask your health care provider. °Do not drink alcohol. °Make healthy lifestyle changes as recommended. These may include: °Getting regular exercise. Ask your health care provider to suggest some exercises that are safe for you. °Eating a heart-healthy diet. This includes plenty of fresh fruits and vegetables, whole grains, low-fat (lean) protein, and low-fat dairy products. A dietitian can help you find healthy eating options. °Maintaining a healthy weight. °Managing any other health conditions you may have, such as high blood pressure (hypertension) or diabetes. °Reducing stress, such as with yoga or relaxation techniques. °General instructions °Pay attention to any changes in your symptoms. °It is up to you to get the results of any tests that were done. Ask your health care provider, or the department that is doing the tests, when your results will be ready. °Keep all follow-up visits as told by your health care provider. This is important. °You may be asked to go for further testing if your chest pain does not go away. °Contact a health care provider if: °Your chest pain does not go away. °You feel depressed. °You have a fever. °You notice changes in your symptoms or develop new symptoms. °Get help right away if: °Your chest pain gets worse. °You have a cough that gets worse, or you cough up   blood. °You have severe pain in your abdomen. °You faint. °You have sudden, unexplained chest discomfort. °You have sudden, unexplained discomfort in your arms, back, neck, or jaw. °You have shortness of breath at any time. °You suddenly start to sweat, or your skin gets clammy. °You feel nausea or you vomit. °You suddenly  feel lightheaded or dizzy. °You have severe weakness, or unexplained weakness or fatigue. °Your heart begins to beat quickly, or it feels like it is skipping beats. °These symptoms may represent a serious problem that is an emergency. Do not wait to see if the symptoms will go away. Get medical help right away. Call your local emergency services (911 in the [place] Do not drive yourself to the hospital. °Summary °Chest pain can be caused by a condition that is serious and requires urgent treatment. It may also be caused by something that is not life-threatening. °Your health care provider may do lab tests and other studies to find the cause of your pain. °Follow your health care provider's instructions on taking medicines, making lifestyle changes, and getting emergency treatment if symptoms become worse. °Keep all follow-up visits as told by your health care provider. This includes visits for any further testing if your chest pain does not go away. °This information is not intended to replace advice given to you by your health care provider. Make sure you discuss any questions you have with your health care provider. °Document Revised: 11/04/2020 Document Reviewed: 11/04/2020 °Elsevier Patient Education © 2022 [employer] ° °

## 2021-10-03 NOTE — Assessment & Plan Note (Signed)
hgba1c acceptable, minimize simple carbs. Increase exercise as tolerated.  

## 2021-10-03 NOTE — Assessment & Plan Note (Signed)
Today noting a recent development of atypical chest. Has had one or two episodes of discomfort 5 of the last 7 days. No associated symptoms such as diaphoresis, reflux, n/v, sob, palpitations etc. EKG does not show any acute concerns, NSR and rate, no acute changes. 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

## 2021-10-03 NOTE — Assessment & Plan Note (Addendum)
She had surgery to repair in April 2022 and dropped her Omeprazole 20 mg from 40 mg daily to 20 mg daily and she wonders if that is why she is having chest discomfort. Avoid offending foods. Will try and increase Omeprazole 20 mg to bid. Also noting increased belcing and gas so referred to gastroenterology for further  Consideration.

## 2021-10-03 NOTE — Assessment & Plan Note (Signed)
Well controlled, no changes to meds. Encouraged heart healthy diet such as the DASH diet and exercise as tolerated.  °

## 2021-10-03 NOTE — Assessment & Plan Note (Signed)
Stable, she stays active, no changes

## 2021-10-03 NOTE — Progress Notes (Signed)
Subjective:   By signing my name below, I, Zite Okoli, attest that this documentation has been prepared under the direction and in the presence of Mosie Lukes, MD. 10/03/2021       Patient ID: Kristin [name], female    DOB: Dec 06, 1950, 71 y.o.   MRN: [id number]  Chief Complaint  Patient presents with   4 months follow up    HPI Patient is in today for an office visit and 4 month f/u.  She reports she has been having a "twinge" in her chest for the past week and there is no pattern to it. The feeling is more of a dull ache and vague discomfort but lasts for a few minutes to an hour and resolves without any medication. Happens 1-2 times a day 5 out of 7 days.  She would like to start seeing a cardiologist.   She reports she has been constantly burping everyday for the past month. No recent antibiotic use. She would like a new gastroenterologist. She adds that she is still very constipated. Using miralax and benefiber which only works for 3 days out of the week. Notes she is not in any pain but is always gassy.  She has noticed an increase in anxiety, agitation and depression. She is worried about bipolar disorder because her mother was diagnosed with it.  She is also stressed about her Parkinson's because she has been falling more frequently.  She is using 20 mg lexapro to manage her mood and is doing well on it.   Past Medical History:  Diagnosis Date   Abnormal vaginal Pap smear    Acute pharyngitis 02/25/2013   Anemia    Anxiety    Arthritis    BCC (basal cell carcinoma of skin) 10/05/2014   On back   Chicken pox as a child   Chronic UTI    sees dr Terance Hart   Constipation 12/07/2015   Depression with anxiety 11/02/2009   Qualifier: Diagnosis of  By: [name], LPN, Bonnye M    Dermatitis 07/17/2012   Esophageal stricture 1994   Fibroids    Foot pain, bilateral 06/19/2012   GERD (gastroesophageal reflux disease)    GERD (gastroesophageal reflux disease)    Hiatal hernia     Hyperglycemia 08/19/2013   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   Hyperlipidemia    Hypertension    Infertility, female    Low back pain 10/17/2007   Qualifier: Diagnosis of  By: [name], [name]    Measles as a child   Obesity    Osteoarthritis    Parkinson disease ([place])    Plantar fasciitis of left foot 06/19/2012   Preventative health care 12/19/2015   Rheumatoid arthritis ([place]) 01/08/2018   Rosacea 10/05/2014   Swallowing difficulty    Urinary frequency 02/25/2013   Visual floaters 05/11/2014    Past Surgical History:  Procedure Laterality Date   ABDOMINAL HYSTERECTOMY  2006   total   esophageal     stretching   HERNIA REPAIR N/A    laporoscopy     LEFT HEART CATH AND CORONARY ANGIOGRAPHY N/A 06/02/2019   Procedure: LEFT HEART CATH AND CORONARY ANGIOGRAPHY;  Surgeon: Jettie Booze, MD;  Location: [facility];  Service: Cardiovascular;  Laterality: N/A;   TONSILLECTOMY     TOTAL HIP ARTHROPLASTY Right 2010   wisdom teeth extracted      Family History  Problem Relation Age of Onset   Cancer Mother  breast   Other Mother        arrythmia   Mental illness Mother        bipolar   Hyperlipidemia Mother    Thyroid disease Mother    Depression Mother    Bipolar disorder Mother    Breast cancer Mother 70   Heart disease Father    Arthritis Father        rheumatoid   Hypertension Father    Hyperlipidemia Father    Depression Sister    Mental illness Sister        bipolar   Parkinson's disease Sister    Arthritis Sister    Arthritis Sister    Arthritis Sister    Breast cancer Maternal Aunt 15   Breast cancer Maternal Uncle    Colon cancer Neg Hx    Esophageal cancer Neg Hx    Rectal cancer Neg Hx    Stomach cancer Neg Hx     Social History   Socioeconomic History   Marital status: Married    Spouse name: Not on file   Number of children: 1   Years of education: Not on file   Highest education level: Master's degree (e.g., MA, MS, MEng, MEd,  MSW, MBA)  Occupational History   Occupation: Retired Teacher, music  Tobacco Use   Smoking status: Never   Smokeless tobacco: Never  Vaping Use   Vaping Use: Never used  Substance and Sexual Activity   Alcohol use: Not Currently   Drug use: No   Sexual activity: Not Currently    Partners: Male  Other Topics Concern   Not on file  Social History Narrative   Lives with husband, no major dietary restrictions, retired from teaching      Husband dx with cancer MDS June 2017.   Currently in remission. (11/03/16 pc)      Pt has one biological child the other 3 are adopted      Social Determinants of Health   Financial Resource Strain: Low Risk    Difficulty of Paying Living Expenses: Not hard at all  Food Insecurity: No Food Insecurity   Worried About Charity fundraiser in the Last Year: Never true   [place] in the Last Year: Never true  Transportation Needs: No Transportation Needs   Lack of Transportation (Medical): No   Lack of Transportation (Non-Medical): No  Physical Activity: Sufficiently Active   Days of Exercise per Week: 6 days   Minutes of Exercise per Session: 40 min  Stress: No Stress Concern Present   Feeling of Stress : Not at all  Social Connections: Moderately Integrated   Frequency of Communication with Friends and Family: More than three times a week   Frequency of Social Gatherings with Friends and Family: Once a week   Attends Religious Services: 1 to 4 times per year   Active Member of [employer] or Organizations: No   Attends Archivist Meetings: Never   Marital Status: Married  Human resources officer Violence: Not At Risk   Fear of Current or Ex-Partner: No   Emotionally Abused: No   Physically Abused: No   Sexually Abused: No    Outpatient Medications Prior to Visit  Medication Sig Dispense Refill   carbidopa-levodopa (SINEMET CR) 50-200 MG tablet TAKE 1 TABLET BY MOUTH EVERYDAY AT BEDTIME 90 tablet 2   carbidopa-levodopa (SINEMET IR)  25-100 MG tablet TAKE 2 TABLETS BY MOUTH AT 7AM, 2TABS AT 11AM, 1TAB AT 4PM 450  tablet 0   carvedilol (COREG) 3.125 MG tablet TAKE 1 TABLET BY MOUTH 2 TIMES DAILY WITH A MEAL. 180 tablet 1   DULoxetine (CYMBALTA) 30 MG capsule TAKE 1 CAPSULE BY MOUTH EVERY DAY 90 capsule 1   escitalopram (LEXAPRO) 20 MG tablet TAKE 1 TABLET BY MOUTH EVERY DAY 90 tablet 1   folic acid (FOLVITE) 1 MG tablet Take 1 mg by mouth 3 (three) times daily.      hydroxychloroquine (PLAQUENIL) 200 MG tablet Take 2 tablets (400 mg total) by mouth daily.     losartan (COZAAR) 50 MG tablet TAKE 1 TABLET BY MOUTH EVERY DAY 90 tablet 1   methotrexate 250 MG/10ML injection methotrexate sodium 25 mg/mL injection solution  INJECT 0.8MLS SUBCUTANEOUSLY EVERY 7 DAYS     polyethylene glycol (MIRALAX / GLYCOLAX) 17 g packet Take 17 g by mouth every other day.     pramipexole (MIRAPEX) 0.5 MG tablet Take 1 tablet (0.5 mg total) by mouth 3 (three) times daily. 270 tablet 2   rosuvastatin (CRESTOR) 10 MG tablet Take 1 tablet by mouth on Mondays, Wednesdays, and Fridays 39 tablet 1   solifenacin (VESICARE) 5 MG tablet Take 5 mg by mouth daily.     Vitamin D, Ergocalciferol, (DRISDOL) 1.25 MG (50000 UNIT) CAPS capsule Take 1 capsule (50,000 Units total) by mouth every 7 (seven) days. 12 capsule 0   omeprazole (PRILOSEC) 20 MG capsule TAKE 1 CAPSULE BY MOUTH DAILY AS NEEDED. 90 capsule 1   COVID-19 mRNA bivalent vaccine, Pfizer, (PFIZER COVID-19 VAC BIVALENT) injection Inject into the muscle. 0.3 mL 0   No facility-administered medications prior to visit.    Allergies  Allergen Reactions   Prednisone Dermatitis    Facial ulcers   Bactrim [Sulfamethoxazole-Trimethoprim] Other (See Comments)    Oral ulcers and rash   Pb-Hyoscy-Atropine-Scopolamine [Phenobarbital-Belladonna Alk] Other (See Comments)    Panic and hallucinations   Penicillins Hives    Did it involve swelling of the face/tongue/throat, SOB, or low BP? No Did it involve  sudden or severe rash/hives, skin peeling, or any reaction on the inside of your mouth or nose? No Did you need to seek medical attention at a hospital or doctor's office? No When did it last happen?      30+ years ago If all above answers are NO, may proceed with cephalosporin use.    Scopolamine Other (See Comments)    Panic hallu    Review of Systems  Constitutional:  Negative for fever and malaise/fatigue.  HENT:  Negative for congestion.   Eyes:  Negative for redness.  Respiratory:  Negative for shortness of breath.   Cardiovascular:  Negative for chest pain, palpitations and leg swelling.  Gastrointestinal:  Positive for constipation. Negative for abdominal pain, blood in stool and nausea.  Genitourinary:  Negative for dysuria and frequency.  Musculoskeletal:  Negative for falls.  Skin:  Negative for rash.  Neurological:  Negative for dizziness, loss of consciousness and headaches.  Endo/Heme/Allergies:  Negative for polydipsia.  Psychiatric/Behavioral:  Positive for depression. The patient is nervous/anxious.       Objective:    Physical Exam Constitutional:      General: She is not in acute distress.    Appearance: She is well-developed.  HENT:     Head: Normocephalic and atraumatic.  Eyes:     Conjunctiva/sclera: Conjunctivae normal.  Neck:     Thyroid: No thyromegaly.  Cardiovascular:     Rate and Rhythm: Normal rate and regular rhythm.  Heart sounds: Normal heart sounds. No murmur heard. Pulmonary:     Effort: Pulmonary effort is normal. No respiratory distress.     Breath sounds: Normal breath sounds.  Abdominal:     General: Bowel sounds are normal. There is no distension.     Palpations: Abdomen is soft. There is no mass.     Tenderness: There is no abdominal tenderness.  Musculoskeletal:     Cervical back: Neck supple.  Lymphadenopathy:     Cervical: No cervical adenopathy.  Skin:    General: Skin is warm and dry.  Neurological:     Mental  Status: She is alert and oriented to person, place, and time.  Psychiatric:        Behavior: Behavior normal.    BP 114/72    Pulse 94    Temp 97.9 F (36.6 C)    Resp 16    Ht $R'5\' 3"'nm$  (1.6 m)    Wt 232 lb (105.2 kg)    SpO2 96%    BMI 41.10 kg/m  Wt Readings from Last 3 Encounters:  10/03/21 232 lb (105.2 kg)  05/30/21 218 lb 12.8 oz (99.2 kg)  04/12/21 217 lb 6.4 oz (98.6 kg)    Diabetic Foot Exam - Simple   No data filed    Lab Results  Component Value Date   WBC 5.7 05/30/2021   HGB 12.3 05/30/2021   HCT 37.8 05/30/2021   PLT 204.0 05/30/2021   GLUCOSE 90 05/30/2021   CHOL 158 05/30/2021   TRIG 106.0 05/30/2021   HDL 70.40 05/30/2021   LDLDIRECT 161.5 10/07/2008   LDLCALC 67 05/30/2021   ALT 5 05/30/2021   AST 16 05/30/2021   NA 141 05/30/2021   K 4.4 05/30/2021   CL 105 05/30/2021   CREATININE 0.74 05/30/2021   BUN 16 05/30/2021   CO2 29 05/30/2021   TSH 1.27 05/30/2021   INR 2.5 (H) 01/19/2009   HGBA1C 5.8 05/30/2021    Lab Results  Component Value Date   TSH 1.27 05/30/2021   Lab Results  Component Value Date   WBC 5.7 05/30/2021   HGB 12.3 05/30/2021   HCT 37.8 05/30/2021   MCV 89.9 05/30/2021   PLT 204.0 05/30/2021   Lab Results  Component Value Date   NA 141 05/30/2021   K 4.4 05/30/2021   CO2 29 05/30/2021   GLUCOSE 90 05/30/2021   BUN 16 05/30/2021   CREATININE 0.74 05/30/2021   BILITOT 0.7 05/30/2021   ALKPHOS 78 05/30/2021   AST 16 05/30/2021   ALT 5 05/30/2021   PROT 6.4 05/30/2021   ALBUMIN 4.2 05/30/2021   CALCIUM 9.4 05/30/2021   ANIONGAP 14 12/10/2020   GFR 81.61 05/30/2021   Lab Results  Component Value Date   CHOL 158 05/30/2021   Lab Results  Component Value Date   HDL 70.40 05/30/2021   Lab Results  Component Value Date   LDLCALC 67 05/30/2021   Lab Results  Component Value Date   TRIG 106.0 05/30/2021   Lab Results  Component Value Date   CHOLHDL 2 05/30/2021   Lab Results  Component Value Date    HGBA1C 5.8 05/30/2021       Assessment & Plan:   Problem List Items Addressed This Visit     Hyperlipidemia   Relevant Orders   Lipid panel   Ambulatory referral to Cardiology   H/O: iron deficiency anemia - Primary   Relevant Orders   CBC   Depression with  anxiety    She notes increased anxiety recently and is worried about a FH that includes some bipolar. Has not been a lifelong struggle she is encouraged to go back and discuss with trusted loved ones and if they are concerned can refer to psychiatry for further consideration. Anxiety largely a result of her struggle with Parkinson's most likely      Essential hypertension    Well controlled, no changes to meds. Encouraged heart healthy diet such as the DASH diet and exercise as tolerated.       Relevant Orders   TSH   Ambulatory referral to Cardiology   Hiatal hernia with GERD    She had surgery to repair in April 2022 and dropped her Omeprazole 20 mg from 40 mg daily to 20 mg daily and she wonders if that is why she is having chest discomfort. Avoid offending foods. Will try and increase Omeprazole 20 mg to bid. Also noting increased belcing and gas so referred to gastroenterology for further  Consideration.      Relevant Medications   omeprazole (PRILOSEC) 20 MG capsule   Other Relevant Orders   Comprehensive metabolic panel   Parkinson's disease ([place])    Continues to follow with neurology but is also noting increased anxiety recently. She will let us know if that gets worse and she is ready to address it directly      Constipation    Encouraged increased hydration and fiber in diet. Daily probiotics. If bowels not moving can use MOM 2 tbls po in 4 oz of warm prune juice by mouth every 2-3 days. If no results then repeat in 4 hours with  Dulcolax suppository pr, may repeat again in 4 more hours as needed. Seek care if symptoms worsen. Consider daily Miralax and/or Dulcolax if symptoms persist.       Relevant Orders    Ambulatory referral to Gastroenterology   Vitamin D deficiency    Supplement and monitor      Relevant Orders   VITAMIN D 25 Hydroxy (Vit-D Deficiency, Fractures)   Prediabetes    hgba1c acceptable, minimize simple carbs. Increase exercise as tolerated.       Relevant Orders   Hemoglobin A1c   Ambulatory referral to Cardiology   Rheumatoid arthritis ([place])    Stable, she stays active, no changes      Coronary artery disease involving native coronary artery of native heart without angina pectoris    Today noting a recent development of atypical chest. Has had one or two episodes of discomfort 5 of the last 7 days. No associated symptoms such as diaphoresis, reflux, n/v, sob, palpitations etc. EKG does not show any acute concerns, NSR and rate, no acute changes. 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       Vitamin B 12 deficiency    Supplement and monitor      Relevant Orders   Vitamin B12   Other Visit Diagnoses     Atypical chest pain       Relevant Orders   EKG 12-Lead (Completed)   Belching       Relevant Orders   Ambulatory referral to Gastroenterology   Gassiness       Relevant Orders   Ambulatory referral to Gastroenterology   Reflux gastritis       Relevant Orders   Ambulatory referral to Gastroenterology       Meds ordered this encounter  Medications   omeprazole (PRILOSEC)  20 MG capsule    Sig: Take 1 capsule (20 mg total) by mouth 2 (two) times daily as needed.    Dispense:  180 capsule    Refill:  1    I,Zite Okoli,acting as a scribe for [name], MD.,have documented all relevant documentation on the behalf of [name], MD,as directed by  [name], MD while in the presence of [name], MD.   I, Mosie Lukes, MD., personally preformed the services described in this documentation.  All medical record entries made by the scribe were at my direction and in my presence.  I have reviewed  the chart and discharge instructions (if applicable) and agree that the record reflects my personal performance and is accurate and complete. 10/03/2021

## 2021-10-03 NOTE — Assessment & Plan Note (Signed)
Encouraged increased hydration and fiber in diet. Daily probiotics. If bowels not moving can use MOM 2 tbls po in 4 oz of warm prune juice by mouth every 2-3 days. If no results then repeat in 4 hours with  Dulcolax suppository pr, may repeat again in 4 more hours as needed. Seek care if symptoms worsen. Consider daily Miralax and/or Dulcolax if symptoms persist.  

## 2021-10-05 NOTE — Progress Notes (Signed)
Seen by patient Kristin [name] on 10/03/2021  7:06 PM

## 2021-10-13 ENCOUNTER — Telehealth: Payer: Self-pay | Admitting: Physical Therapy

## 2021-10-13 DIAGNOSIS — G20 Parkinson's disease: Secondary | ICD-10-CM

## 2021-10-13 DIAGNOSIS — Z0289 Encounter for other administrative examinations: Secondary | ICD-10-CM

## 2021-10-13 NOTE — Telephone Encounter (Signed)
Good afternoon, Kristin [name] is scheduled for a return PT eval, as agreed upon at her previous bout of therapy discharge.  Could you please send a referral for PT eval and treat?  Thank you.  Modena Nunnery, PT 10/13/21 1:41 PM Phone: [phone] Fax: [phone]

## 2021-10-14 NOTE — Progress Notes (Signed)
Assessment/Plan:   1.  Parkinsons Disease             -Continue pramipexole 0.5 mg 3 times per day.  Higher dosages with SE             -Continue carbidopa/levodopa 25/100, 2/2/1 (can chew half to 1 in the middle of the night if awakens with tremor)             -She will continue Carbidopa/levodopa 50/200 at night.  This has definitely helped restless leg and has helped most of the nighttime cramping of the feet and legs.  -discussed amantadine/entacapone/opicapone.  She wants to hold on those options for now and try to take current meds more on schedule  -handicap placard filled out 2.  Depression and anxiety             - She is on a combination of Lexapro and Cymbalta 3.  Dysphagia             -last MBE May, 2021 with mild pharyngeal cervical esophageal deficits.  It was recommended to consider esophageal assessment. She has EGD scheduled  4.  Rheumatoid arthritis             -now on Plaquenil. 5.  REM behavior disorder             -Understands safety associated with this.  Does not want more medications. 6.  Constipation             -She was following with gastroenterology in [place].  Just got a new referral to gastroenterology in [facility] ([place]) as it is closer       Subjective:   Kristin [name] was seen today in follow up for Parkinsons disease.  My previous records were reviewed prior to todays visit as well as outside records available to me. This patient is accompanied in the office by her spouse who supplements the history.  Patient has been to therapy since our last visit.  Therapy did note that potentially dyskinesia or dystonia in the right foot was beginning to change the gait pattern.  Discussed with her physical therapist that I certainly noted dyskinesia on the right leg in the past but did not note this change her gait pattern (when I saw her) but happy to evaluate that.  I have not seen any dystonia in her in the past. She states that she has worked on  that in PT and it actually resolved and the foot has not turned in now.   Feels today that she has to think more about how she walks and has to be more careful.  She has fallen a few times - getting out of the car and was chatting and distracted. She is using a walking stick now and she has not fallen with that (has had that since the beginning of October).  She does c/o curled toes on the R but the curl doesn't change in the day - it is persistent curl.  She doesn't think that it is hammer toes.  She states that swimming has helped.  It doesn't bother her with walking.  She doesn't like that she has had to use it.   On a separate note, patient is having more constipation.  She just got a new referral to gastroenterology.  Current prescribed movement disorder medications: Carbidopa/levodopa 25/100, 2 tablets in the morning, 2 in the afternoon and 1 in the evening (7am/then between 11am-noon/4pm) Carbidopa/levodopa 50/200 at  bedtime Pramipexole 0.5 mg 3 times per day   Prior meds: pramipexole (higher dosages with mild compulsive behavior - cleaning/some shopping)   ALLERGIES:   Allergies  Allergen Reactions   Prednisone Dermatitis    Facial ulcers   Bactrim [Sulfamethoxazole-Trimethoprim] Other (See Comments)    Oral ulcers and rash   Pb-Hyoscy-Atropine-Scopolamine [Phenobarbital-Belladonna Alk] Other (See Comments)    Panic and hallucinations   Penicillins Hives    Did it involve swelling of the face/tongue/throat, SOB, or low BP? No Did it involve sudden or severe rash/hives, skin peeling, or any reaction on the inside of your mouth or nose? No Did you need to seek medical attention at a hospital or doctor's office? No When did it last happen?      30+ years ago If all above answers are NO, may proceed with cephalosporin use.    Scopolamine Other (See Comments)    Panic hallu    CURRENT MEDICATIONS:  Outpatient Encounter Medications as of 10/18/2021  Medication Sig    carbidopa-levodopa (SINEMET CR) 50-200 MG tablet TAKE 1 TABLET BY MOUTH EVERYDAY AT BEDTIME   carbidopa-levodopa (SINEMET IR) 25-100 MG tablet TAKE 2 TABLETS BY MOUTH AT 7AM, 2TABS AT 11AM, 1TAB AT 4PM   carvedilol (COREG) 3.125 MG tablet TAKE 1 TABLET BY MOUTH 2 TIMES DAILY WITH A MEAL.   DULoxetine (CYMBALTA) 30 MG capsule TAKE 1 CAPSULE BY MOUTH EVERY DAY   escitalopram (LEXAPRO) 20 MG tablet TAKE 1 TABLET BY MOUTH EVERY DAY   folic acid (FOLVITE) 1 MG tablet Take 1 mg by mouth 3 (three) times daily.    hydroxychloroquine (PLAQUENIL) 200 MG tablet Take 200 mg by mouth daily.   losartan (COZAAR) 50 MG tablet TAKE 1 TABLET BY MOUTH EVERY DAY   methotrexate 250 MG/10ML injection methotrexate sodium 25 mg/mL injection solution  INJECT 0.8MLS SUBCUTANEOUSLY EVERY 7 DAYS   omeprazole (PRILOSEC) 20 MG capsule Take 1 capsule (20 mg total) by mouth 2 (two) times daily as needed.   polyethylene glycol (MIRALAX / GLYCOLAX) 17 g packet Take 17 g by mouth every other day.   pramipexole (MIRAPEX) 0.5 MG tablet Take 1 tablet (0.5 mg total) by mouth 3 (three) times daily.   rosuvastatin (CRESTOR) 10 MG tablet Take 1 tablet by mouth on Mondays, Wednesdays, and Fridays   solifenacin (VESICARE) 5 MG tablet Take 5 mg by mouth daily.   Vitamin D, Ergocalciferol, (DRISDOL) 1.25 MG (50000 UNIT) CAPS capsule Take 1 capsule (50,000 Units total) by mouth every 7 (seven) days.   No facility-administered encounter medications on file as of 10/18/2021.    Objective:   PHYSICAL EXAMINATION:    VITALS:   Vitals:   10/18/21 0810  BP: 120/78  Pulse: 73  SpO2: 97%  Weight: 236 lb (107 kg)  Height: _0  (1.6 m)      GEN:  The patient appears stated age and is in NAD. HEENT:  Normocephalic, atraumatic.  The mucous membranes are moist.   Neurological examination:  Orientation: The patient is alert and oriented x3. Cranial nerves: There is good facial symmetry without significant hypomimia. The speech is  fluent and clear. Soft palate rises symmetrically and there is no tongue deviation. Hearing is intact to conversational tone. Sensation: Sensation is intact to light touch throughout Motor: Strength is at least antigravity x4.  Movement examination: Tone: There is normal tone today Abnormal movements: rare LLE tremor - no dyskinesia today Coordination:  There is good RAMs today in the bilateral UE/LE  Gait and Station: The patient has no difficulty arising out of a deep-seated chair without the use of the hands. The patient's stride length is good.  She has R arm flexion with ambulation  I have reviewed and interpreted the following labs independently    Chemistry      Component Value Date/Time   NA 141 10/03/2021 1100   NA 144 12/05/2019 0935   K 4.3 10/03/2021 1100   CL 104 10/03/2021 1100   CO2 29 10/03/2021 1100   BUN 19 10/03/2021 1100   BUN 13 12/05/2019 0935   CREATININE 0.76 10/03/2021 1100   CREATININE 0.71 01/04/2015 1046      Component Value Date/Time   CALCIUM 9.5 10/03/2021 1100   ALKPHOS 80 10/03/2021 1100   AST 20 10/03/2021 1100   ALT 10 10/03/2021 1100   BILITOT 0.6 10/03/2021 1100   BILITOT 0.4 11/27/2016 1123       Lab Results  Component Value Date   WBC 6.1 10/03/2021   HGB 12.5 10/03/2021   HCT 38.1 10/03/2021   MCV 91.7 10/03/2021   PLT 200.0 10/03/2021    Lab Results  Component Value Date   TSH 1.20 10/03/2021     Total time spent on today's visit was 38 minutes, including both face-to-face time and nonface-to-face time.  Time included that spent on review of records (prior notes available to me/labs/imaging if pertinent), discussing treatment and goals, answering patient's questions and coordinating care.  Cc:  Mosie Lukes, MD

## 2021-10-18 ENCOUNTER — Other Ambulatory Visit: Payer: Self-pay

## 2021-10-18 ENCOUNTER — Ambulatory Visit (INDEPENDENT_AMBULATORY_CARE_PROVIDER_SITE_OTHER): Payer: Medicare Other | Admitting: Neurology

## 2021-10-18 ENCOUNTER — Encounter: Payer: Self-pay | Admitting: Neurology

## 2021-10-18 VITALS — BP 120/78 | HR 73 | Ht 63.0 in | Wt 236.0 lb

## 2021-10-18 DIAGNOSIS — I2510 Atherosclerotic heart disease of native coronary artery without angina pectoris: Secondary | ICD-10-CM | POA: Diagnosis not present

## 2021-10-18 DIAGNOSIS — G20 Parkinson's disease: Secondary | ICD-10-CM

## 2021-10-21 ENCOUNTER — Telehealth: Payer: Self-pay | Admitting: Gastroenterology

## 2021-10-21 NOTE — Telephone Encounter (Signed)
Good Afternoon Dr. Fuller Plan,  Patient called today to see if she could transfer her care to a female Dr. Patient stated that she would like to switch care to Dr. Silverio Decamp. Patient was seen 10/21 for both a Endo and Colon and has a referral in to be seen for constipation, belching, gassiness and reflux.  Are you okay with the switch? Please advise.

## 2021-10-24 NOTE — Telephone Encounter (Signed)
Good Morning Dr. Silverio Decamp,   Are you okay with taking on patients care?  Please advise.

## 2021-10-24 NOTE — Telephone Encounter (Signed)
OK with me per patient request.

## 2021-10-25 NOTE — Telephone Encounter (Signed)
Scheduled OV 4/21

## 2021-10-25 NOTE — Telephone Encounter (Signed)
Sure, please schedule next available appointment.  Thanks

## 2021-11-01 NOTE — Progress Notes (Deleted)
Subjective:   Kristin [name] is a 71 y.o. female who presents for Medicare Annual (Subsequent) preventive examination.  Review of Systems    ***       Objective:    There were no vitals filed for this visit. There is no height or weight on file to calculate BMI.  Advanced Directives 10/18/2021 04/12/2021 12/10/2020 12/10/2020 10/28/2020 10/18/2020 04/22/2020  Does Patient Have a Medical Advance Directive? Yes No No No Yes Yes Yes  Type of Advance Directive - - - - Press photographer;Living will [place];Living will [place];Living will  Does patient want to make changes to medical advance directive? - - - - - - -  Copy of [place] in Chart? - - - - Yes - validated most recent copy scanned in chart (See row information) - -  Would patient like information on creating a medical advance directive? - - No - Patient declined - - - -    Current Medications (verified) Outpatient Encounter Medications as of 11/03/2021  Medication Sig   carbidopa-levodopa (SINEMET CR) 50-200 MG tablet TAKE 1 TABLET BY MOUTH EVERYDAY AT BEDTIME   carbidopa-levodopa (SINEMET IR) 25-100 MG tablet TAKE 2 TABLETS BY MOUTH AT 7AM, 2TABS AT 11AM, 1TAB AT 4PM   carvedilol (COREG) 3.125 MG tablet TAKE 1 TABLET BY MOUTH 2 TIMES DAILY WITH A MEAL.   DULoxetine (CYMBALTA) 30 MG capsule TAKE 1 CAPSULE BY MOUTH EVERY DAY   escitalopram (LEXAPRO) 20 MG tablet TAKE 1 TABLET BY MOUTH EVERY DAY   folic acid (FOLVITE) 1 MG tablet Take 1 mg by mouth 3 (three) times daily.    hydroxychloroquine (PLAQUENIL) 200 MG tablet Take 200 mg by mouth daily.   losartan (COZAAR) 50 MG tablet TAKE 1 TABLET BY MOUTH EVERY DAY   methotrexate 250 MG/10ML injection methotrexate sodium 25 mg/mL injection solution  INJECT 0.8MLS SUBCUTANEOUSLY EVERY 7 DAYS   omeprazole (PRILOSEC) 20 MG capsule Take 1 capsule (20 mg total) by mouth 2 (two) times daily as needed.   polyethylene glycol  (MIRALAX / GLYCOLAX) 17 g packet Take 17 g by mouth every other day.   pramipexole (MIRAPEX) 0.5 MG tablet Take 1 tablet (0.5 mg total) by mouth 3 (three) times daily.   rosuvastatin (CRESTOR) 10 MG tablet Take 1 tablet by mouth on Mondays, Wednesdays, and Fridays   solifenacin (VESICARE) 5 MG tablet Take 5 mg by mouth daily.   Vitamin D, Ergocalciferol, (DRISDOL) 1.25 MG (50000 UNIT) CAPS capsule Take 1 capsule (50,000 Units total) by mouth every 7 (seven) days.   No facility-administered encounter medications on file as of 11/03/2021.    Allergies (verified) Prednisone, Bactrim [sulfamethoxazole-trimethoprim], Pb-hyoscy-atropine-scopolamine [phenobarbital-belladonna alk], Penicillins, and Scopolamine   History: Past Medical History:  Diagnosis Date   Abnormal vaginal Pap smear    Acute pharyngitis 02/25/2013   Anemia    Anxiety    Arthritis    BCC (basal cell carcinoma of skin) 10/05/2014   On back   Chicken pox as a child   Chronic UTI    sees dr Terance Hart   Constipation 12/07/2015   Depression with anxiety 11/02/2009   Qualifier: Diagnosis of  By: [name], LPN, [name]    Dermatitis 07/17/2012   Esophageal stricture 1994   Fibroids    Foot pain, bilateral 06/19/2012   GERD (gastroesophageal reflux disease)    GERD (gastroesophageal reflux disease)    Hiatal hernia    Hyperglycemia 08/19/2013  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   Hyperlipidemia    Hypertension    Infertility, female    Low back pain 10/17/2007   Qualifier: Diagnosis of  By: [name], [name]    Measles as a child   Obesity    Osteoarthritis    Parkinson disease ([place])    Plantar fasciitis of left foot 06/19/2012   Preventative health care 12/19/2015   Rheumatoid arthritis ([place]) 01/08/2018   Rosacea 10/05/2014   Swallowing difficulty    Urinary frequency 02/25/2013   Visual floaters 05/11/2014   Past Surgical History:  Procedure Laterality Date   ABDOMINAL HYSTERECTOMY  2006   total   esophageal      stretching   HERNIA REPAIR N/A    laporoscopy     LEFT HEART CATH AND CORONARY ANGIOGRAPHY N/A 06/02/2019   Procedure: LEFT HEART CATH AND CORONARY ANGIOGRAPHY;  Surgeon: Jettie Booze, MD;  Location: [facility];  Service: Cardiovascular;  Laterality: N/A;   TONSILLECTOMY     TOTAL HIP ARTHROPLASTY Right 2010   wisdom teeth extracted     Family History  Problem Relation Age of Onset   Cancer Mother        breast   Other Mother        arrythmia   Mental illness Mother        bipolar   Hyperlipidemia Mother    Thyroid disease Mother    Depression Mother    Bipolar disorder Mother    Breast cancer Mother 63   Heart disease Father    Arthritis Father        rheumatoid   Hypertension Father    Hyperlipidemia Father    Depression Sister    Mental illness Sister        bipolar   Parkinson's disease Sister    Arthritis Sister    Arthritis Sister    Arthritis Sister    Breast cancer Maternal Aunt 73   Breast cancer Maternal Uncle    Colon cancer Neg Hx    Esophageal cancer Neg Hx    Rectal cancer Neg Hx    Stomach cancer Neg Hx    Social History   Socioeconomic History   Marital status: Married    Spouse name: Not on file   Number of children: 1   Years of education: Not on file   Highest education level: Master's degree (e.g., MA, MS, MEng, MEd, MSW, MBA)  Occupational History   Occupation: Retired Teacher, music  Tobacco Use   Smoking status: Never   Smokeless tobacco: Never  Vaping Use   Vaping Use: Never used  Substance and Sexual Activity   Alcohol use: Not Currently   Drug use: No   Sexual activity: Not Currently    Partners: Male  Other Topics Concern   Not on file  Social History Narrative   Lives with husband, no major dietary restrictions, retired from teaching      Husband dx with cancer MDS June 2017.   Currently in remission. (11/03/16 pc)      Pt has one biological child the other 3 are adopted      Social Determinants of  Health   Financial Resource Strain: Not on file  Food Insecurity: Not on file  Transportation Needs: Not on file  Physical Activity: Not on file  Stress: Not on file  Social Connections: Not on file    Tobacco Counseling Counseling given: Not Answered   Clinical Intake:  Diabetic?No         Activities of Daily Living No flowsheet data found.  Patient Care Team: Mosie Lukes, MD as PCP - General (Family Medicine) Berniece Salines, DO as PCP - Cardiology (Cardiology) Tat, Eustace Quail, DO as Consulting Physician (Neurology) Tat, Eustace Quail, DO as Consulting Physician (Neurology)  Indicate any recent Medical Services you may have received from other than [facility] providers in the past year (date may be approximate).     Assessment:   This is a routine wellness examination for [facility].  Hearing/Vision screen No results found.  Dietary issues and exercise activities discussed:     Goals Addressed   None    Depression Screen PHQ 2/9 Scores 10/03/2021 03/10/2021 02/24/2021 10/28/2020 06/17/2020 03/16/2020 10/23/2019  PHQ - 2 Score 2 0 0 0 0 0 0  PHQ- 9 Score 13 - - - 0 - -    Fall Risk Fall Risk  10/18/2021 04/12/2021 03/10/2021 02/24/2021 10/28/2020  Falls in the past year? 1 1 0 1 0  Number falls in past yr: 1 0 0 0 0  Injury with Fall? 0 1 0 0 0  Follow up - - Falls evaluation completed - Falls prevention discussed    FALL RISK PREVENTION PERTAINING TO THE HOME:  Any stairs in or around the home? {YES/NO:21197} If so, are there any without handrails? {YES/NO:21197} Home free of loose throw rugs in walkways, pet beds, electrical cords, etc? {YES/NO:21197} Adequate lighting in your home to reduce risk of falls? {YES/NO:21197}  ASSISTIVE DEVICES UTILIZED TO PREVENT FALLS:  Life alert? {YES/NO:21197} Use of a cane, walker or w/c? {YES/NO:21197} Grab bars in the bathroom? {YES/NO:21197} Shower chair or bench in shower? {YES/NO:21197} Elevated toilet  seat or a handicapped toilet? {YES/NO:21197}  TIMED UP AND GO:  Was the test performed? {YES/NO:21197}.  Length of time to ambulate 10 feet: *** sec.   {Appearance of Gait:2101803}  Cognitive Function: MMSE - Mini Mental State Exam 10/15/2017  Orientation to time 5  Orientation to Place 5  Registration 3  Attention/ Calculation 5  Recall 3  Language- name 2 objects 2  Language- repeat 1  Language- follow 3 step command 3  Language- read & follow direction 1  Write a sentence 1  Copy design 1  Total score 30        Immunizations Immunization History  Administered Date(s) Administered   Fluad Quad(high Dose 65+) 05/30/2021   Influenza Split 06/19/2012   Influenza Whole 07/05/2007, 07/08/2009   Influenza, High Dose Seasonal PF 05/04/2016, 05/10/2017, 05/07/2018, 04/29/2019, 06/17/2020   Influenza,inj,Quad PF,6+ Mos 07/10/2013, 05/05/2014   PFIZER Comirnaty(Gray Top)Covid-19 Tri-Sucrose Vaccine 10/07/2019, 10/30/2019, 01/11/2021   PFIZER(Purple Top)SARS-COV-2 Vaccination 10/07/2019, 10/30/2019, 05/05/2020   Pfizer Covid-19 Vaccine Bivalent Booster 88yr & up 06/15/2021   Pneumococcal Conjugate-13 07/10/2013   Pneumococcal Polysaccharide-23 12/07/2015   Td 09/04/2004   Tdap 01/04/2015   Zoster Recombinat (Shingrix) 12/20/2017, 03/11/2018    TDAP status: Up to date  Flu Vaccine status: Up to date  Pneumococcal vaccine status: Up to date  Covid-19 vaccine status: Completed vaccines  Qualifies for Shingles Vaccine? Yes   Zostavax completed No   Shingrix Completed?: Yes  Screening Tests Health Maintenance  Topic Date Due   MAMMOGRAM  01/26/2023   TETANUS/TDAP  01/03/2025   Pneumonia Vaccine 72 Years old  Completed   INFLUENZA VACCINE  Completed   DEXA SCAN  Completed   COVID-19 Vaccine  Completed   Hepatitis C Screening  Completed   Zoster  Vaccines- Shingrix  Completed   HPV VACCINES  Aged Out   COLONOSCOPY (Pts 45-24yr Insurance coverage will need to be  confirmed)  Discontinued    Health Maintenance  There are no preventive care reminders to display for this patient.  Colorectal cancer screening: Type of screening: Colonoscopy. Completed 06/07/2020. Repeat every 10 years  Mammogram status: Completed 01/25/2021. Repeat every year  Bone Density status: Completed 01/10/2021. Results reflect: Bone density results: OSTEOPOROSIS. Repeat every 2 years.  Lung Cancer Screening: (Low Dose CT Chest recommended if Age 72-80years, 30 pack-year currently smoking OR have quit w/in 15years.) does not qualify.   Lung Cancer Screening Referral: N/A  Additional Screening:  Hepatitis C Screening: does qualify; Completed 12/07/2015  Vision Screening: Recommended annual ophthalmology exams for early detection of glaucoma and other disorders of the eye. Is the patient up to date with their annual eye exam?  {YES/NO:21197} Who is the provider or what is the name of the office in which the patient attends annual eye exams? *** If pt is not established with a provider, would they like to be referred to a provider to establish care? {YES/NO:21197}.   Dental Screening: Recommended annual dental exams for proper oral hygiene  Community Resource Referral / Chronic Care Management: CRR required this visit?  {YES/NO:21197}  CCM required this visit?  {YES/NO:21197}     Plan:     I have personally reviewed and noted the following in the patients chart:   Medical and social history Use of alcohol, tobacco or illicit drugs  Current medications and supplements including opioid prescriptions.  Functional ability and status Nutritional status Physical activity Advanced directives List of other physicians Hospitalizations, surgeries, and ER visits in previous 12 months Vitals Screenings to include cognitive, depression, and falls Referrals and appointments  In addition, I have reviewed and discussed with patient certain preventive protocols, quality  metrics, and best practice recommendations. A written personalized care plan for preventive services as well as general preventive health recommendations were provided to patient.     SDuard BradyChism, CMA   11/01/2021   Nurse Notes: ***

## 2021-11-03 ENCOUNTER — Ambulatory Visit (INDEPENDENT_AMBULATORY_CARE_PROVIDER_SITE_OTHER): Payer: Medicare Other

## 2021-11-03 VITALS — BP 110/64 | HR 68 | Temp 98.7°F | Resp 16 | Ht 63.0 in | Wt 234.2 lb

## 2021-11-03 DIAGNOSIS — Z0000 Encounter for general adult medical examination without abnormal findings: Secondary | ICD-10-CM | POA: Diagnosis not present

## 2021-11-03 NOTE — Patient Instructions (Signed)
Kristin [name] , Thank you for taking time to come for your Medicare Wellness Visit. I appreciate your ongoing commitment to your health goals. Please review the following plan we discussed and let me know if I can assist you in the future.   Screening recommendations/referrals: Colonoscopy: No longer required Mammogram: Completed 01/25/2021-Due 01/25/2022 Bone Density: Completed 01/10/2021-Due-01/11/2023 Recommended yearly ophthalmology/optometry visit for glaucoma screening and checkup Recommended yearly dental visit for hygiene and checkup  Vaccinations: Influenza vaccine: Up to date Pneumococcal vaccine: Up to date Tdap vaccine: Up to date Shingles vaccine: Completed vaccines   Covid-19:Up to date  Advanced directives: Copy in chart  Conditions/risks identified: See problem list  Next appointment: Follow up in one year for your annual wellness visit 11/07/2022 @ 1:00.   Preventive Care 10 Years and Older, Female Preventive care refers to lifestyle choices and visits with your health care provider that can promote health and wellness. What does preventive care include? A yearly physical exam. This is also called an annual well check. Dental exams once or twice a year. Routine eye exams. Ask your health care provider how often you should have your eyes checked. Personal lifestyle choices, including: Daily care of your teeth and gums. Regular physical activity. Eating a healthy diet. Avoiding tobacco and drug use. Limiting alcohol use. Practicing safe sex. Taking low-dose aspirin every day. Taking vitamin and mineral supplements as recommended by your health care provider. What happens during an annual well check? The services and screenings done by your health care provider during your annual well check will depend on your age, overall health, lifestyle risk factors, and family history of disease. Counseling  Your health care provider may ask you questions about your: Alcohol  use. Tobacco use. Drug use. Emotional well-being. Home and relationship well-being. Sexual activity. Eating habits. History of falls. Memory and ability to understand (cognition). Work and work Statistician. Reproductive health. Screening  You may have the following tests or measurements: Height, weight, and BMI. Blood pressure. Lipid and cholesterol levels. These may be checked every 5 years, or more frequently if you are over 36 years old. Skin check. Lung cancer screening. You may have this screening every year starting at age 9 if you have a 30-pack-year history of smoking and currently smoke or have quit within the past 15 years. Fecal occult blood test (FOBT) of the stool. You may have this test every year starting at age 27. Flexible sigmoidoscopy or colonoscopy. You may have a sigmoidoscopy every 5 years or a colonoscopy every 10 years starting at age 65. Hepatitis C blood test. Hepatitis B blood test. Sexually transmitted disease (STD) testing. Diabetes screening. This is done by checking your blood sugar (glucose) after you have not eaten for a while (fasting). You may have this done every 1-3 years. Bone density scan. This is done to screen for osteoporosis. You may have this done starting at age 46. Mammogram. This may be done every 1-2 years. Talk to your health care provider about how often you should have regular mammograms. Talk with your health care provider about your test results, treatment options, and if necessary, the need for more tests. Vaccines  Your health care provider may recommend certain vaccines, such as: Influenza vaccine. This is recommended every year. Tetanus, diphtheria, and acellular pertussis (Tdap, Td) vaccine. You may need a Td booster every 10 years. Zoster vaccine. You may need this after age 57. Pneumococcal 13-valent conjugate (PCV13) vaccine. One dose is recommended after age 38. Pneumococcal polysaccharide (PPSV23)  vaccine. One dose is  recommended after age 10. Talk to your health care provider about which screenings and vaccines you need and how often you need them. This information is not intended to replace advice given to you by your health care provider. Make sure you discuss any questions you have with your health care provider. Document Released: 09/17/2015 Document Revised: 05/10/2016 Document Reviewed: 06/22/2015 Elsevier Interactive Patient Education  2017 [place] Prevention in the Home Falls can cause injuries. They can happen to people of all ages. There are many things you can do to make your home safe and to help prevent falls. What can I do on the outside of my home? Regularly fix the edges of walkways and driveways and fix any cracks. Remove anything that might make you trip as you walk through a door, such as a raised step or threshold. Trim any bushes or trees on the path to your home. Use bright outdoor lighting. Clear any walking paths of anything that might make someone trip, such as rocks or tools. Regularly check to see if handrails are loose or broken. Make sure that both sides of any steps have handrails. Any raised decks and porches should have guardrails on the edges. Have any leaves, snow, or ice cleared regularly. Use sand or salt on walking paths during winter. Clean up any spills in your garage right away. This includes oil or grease spills. What can I do in the bathroom? Use night lights. Install grab bars by the toilet and in the tub and shower. Do not use towel bars as grab bars. Use non-skid mats or decals in the tub or shower. If you need to sit down in the shower, use a plastic, non-slip stool. Keep the floor dry. Clean up any water that spills on the floor as soon as it happens. Remove soap buildup in the tub or shower regularly. Attach bath mats securely with double-sided non-slip rug tape. Do not have throw rugs and other things on the floor that can make you  trip. What can I do in the bedroom? Use night lights. Make sure that you have a light by your bed that is easy to reach. Do not use any sheets or blankets that are too big for your bed. They should not hang down onto the floor. Have a firm chair that has side arms. You can use this for support while you get dressed. Do not have throw rugs and other things on the floor that can make you trip. What can I do in the kitchen? Clean up any spills right away. Avoid walking on wet floors. Keep items that you use a lot in easy-to-reach places. If you need to reach something above you, use a strong step stool that has a grab bar. Keep electrical cords out of the way. Do not use floor polish or wax that makes floors slippery. If you must use wax, use non-skid floor wax. Do not have throw rugs and other things on the floor that can make you trip. What can I do with my stairs? Do not leave any items on the stairs. Make sure that there are handrails on both sides of the stairs and use them. Fix handrails that are broken or loose. Make sure that handrails are as long as the stairways. Check any carpeting to make sure that it is firmly attached to the stairs. Fix any carpet that is loose or worn. Avoid having throw rugs at the top or bottom  of the stairs. If you do have throw rugs, attach them to the floor with carpet tape. Make sure that you have a light switch at the top of the stairs and the bottom of the stairs. If you do not have them, ask someone to add them for you. What else can I do to help prevent falls? Wear shoes that: Do not have high heels. Have rubber bottoms. Are comfortable and fit you well. Are closed at the toe. Do not wear sandals. If you use a stepladder: Make sure that it is fully opened. Do not climb a closed stepladder. Make sure that both sides of the stepladder are locked into place. Ask someone to hold it for you, if possible. Clearly mark and make sure that you can  see: Any grab bars or handrails. First and last steps. Where the edge of each step is. Use tools that help you move around (mobility aids) if they are needed. These include: Canes. Walkers. Scooters. Crutches. Turn on the lights when you go into a dark area. Replace any light bulbs as soon as they burn out. Set up your furniture so you have a clear path. Avoid moving your furniture around. If any of your floors are uneven, fix them. If there are any pets around you, be aware of where they are. Review your medicines with your doctor. Some medicines can make you feel dizzy. This can increase your chance of falling. Ask your doctor what other things that you can do to help prevent falls. This information is not intended to replace advice given to you by your health care provider. Make sure you discuss any questions you have with your health care provider. Document Released: 06/17/2009 Document Revised: 01/27/2016 Document Reviewed: 09/25/2014 Elsevier Interactive Patient Education  2017 [employer].

## 2021-11-03 NOTE — Progress Notes (Signed)
Subjective:   Kristin [name] is a 71 y.o. female who presents for Medicare Annual (Subsequent) preventive examination.   Review of Systems     Cardiac Risk Factors include: advanced age (>76mn, >>92women);hypertension;dyslipidemia;obesity (BMI >30kg/m2)     Objective:    Today's Vitals   11/03/21 1301  BP: 110/64  Pulse: 68  Resp: 16  Temp: 98.7 F (37.1 C)  TempSrc: Oral  SpO2: 98%  Weight: 234 lb 3.2 oz (106.2 kg)  Height: _0  (1.6 m)   Body mass index is 41.49 kg/m.  Advanced Directives 11/03/2021 10/18/2021 04/12/2021 12/10/2020 12/10/2020 10/28/2020 10/18/2020  Does Patient Have a Medical Advance Directive? Yes Yes No No No Yes Yes  Type of AParamedicof A[place]Living will - - - - HPress photographerLiving will HCarp LakeLiving will  Does patient want to make changes to medical advance directive? - - - - - - -  Copy of HFitzhughin Chart? Yes - validated most recent copy scanned in chart (See row information) - - - - Yes - validated most recent copy scanned in chart (See row information) -  Would patient like information on creating a medical advance directive? - - - No - Patient declined - - -    Current Medications (verified) Outpatient Encounter Medications as of 11/03/2021  Medication Sig   carbidopa-levodopa (SINEMET CR) 50-200 MG tablet TAKE 1 TABLET BY MOUTH EVERYDAY AT BEDTIME   carbidopa-levodopa (SINEMET IR) 25-100 MG tablet TAKE 2 TABLETS BY MOUTH AT 7AM, 2TABS AT 11AM, 1TAB AT 4PM   carvedilol (COREG) 3.125 MG tablet TAKE 1 TABLET BY MOUTH 2 TIMES DAILY WITH A MEAL.   DULoxetine (CYMBALTA) 30 MG capsule TAKE 1 CAPSULE BY MOUTH EVERY DAY   escitalopram (LEXAPRO) 20 MG tablet TAKE 1 TABLET BY MOUTH EVERY DAY   folic acid (FOLVITE) 1 MG tablet Take 1 mg by mouth 3 (three) times daily.    hydroxychloroquine (PLAQUENIL) 200 MG tablet Take 200 mg by mouth daily.   losartan (COZAAR) 50 MG tablet  TAKE 1 TABLET BY MOUTH EVERY DAY   methotrexate 250 MG/10ML injection methotrexate sodium 25 mg/mL injection solution  INJECT 0.8MLS SUBCUTANEOUSLY EVERY 7 DAYS   omeprazole (PRILOSEC) 20 MG capsule Take 1 capsule (20 mg total) by mouth 2 (two) times daily as needed.   polyethylene glycol (MIRALAX / GLYCOLAX) 17 g packet Take 17 g by mouth every other day.   pramipexole (MIRAPEX) 0.5 MG tablet Take 1 tablet (0.5 mg total) by mouth 3 (three) times daily.   rosuvastatin (CRESTOR) 10 MG tablet Take 1 tablet by mouth on Mondays, Wednesdays, and Fridays   solifenacin (VESICARE) 5 MG tablet Take 5 mg by mouth daily.   Vitamin D, Ergocalciferol, (DRISDOL) 1.25 MG (50000 UNIT) CAPS capsule Take 1 capsule (50,000 Units total) by mouth every 7 (seven) days.   No facility-administered encounter medications on file as of 11/03/2021.    Allergies (verified) Prednisone, Bactrim [sulfamethoxazole-trimethoprim], Pb-hyoscy-atropine-scopolamine [phenobarbital-belladonna alk], Penicillins, and Scopolamine   History: Past Medical History:  Diagnosis Date   Abnormal vaginal Pap smear    Acute pharyngitis 02/25/2013   Anemia    Anxiety    Arthritis    BCC (basal cell carcinoma of skin) 10/05/2014   On back   Chicken pox as a child   Chronic UTI    sees dr pTerance Hart  Constipation 12/07/2015   Depression with anxiety 11/02/2009   Qualifier: Diagnosis  of  By: [name], LPN, [name]    Dermatitis 07/17/2012   Esophageal stricture 1994   Fibroids    Foot pain, bilateral 06/19/2012   GERD (gastroesophageal reflux disease)    GERD (gastroesophageal reflux disease)    Hiatal hernia    Hyperglycemia 08/19/2013   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   Hyperlipidemia    Hypertension    Infertility, female    Low back pain 10/17/2007   Qualifier: Diagnosis of  By: [name], [name]    Measles as a child   Obesity    Osteoarthritis    Parkinson disease ([place])    Plantar fasciitis of left foot 06/19/2012    Preventative health care 12/19/2015   Rheumatoid arthritis ([place]) 01/08/2018   Rosacea 10/05/2014   Swallowing difficulty    Urinary frequency 02/25/2013   Visual floaters 05/11/2014   Past Surgical History:  Procedure Laterality Date   ABDOMINAL HYSTERECTOMY  2006   total   esophageal     stretching   HERNIA REPAIR N/A    laporoscopy     LEFT HEART CATH AND CORONARY ANGIOGRAPHY N/A 06/02/2019   Procedure: LEFT HEART CATH AND CORONARY ANGIOGRAPHY;  Surgeon: Jettie Booze, MD;  Location: [facility];  Service: Cardiovascular;  Laterality: N/A;   TONSILLECTOMY     TOTAL HIP ARTHROPLASTY Right 2010   wisdom teeth extracted     Family History  Problem Relation Age of Onset   Cancer Mother        breast   Other Mother        arrythmia   Mental illness Mother        bipolar   Hyperlipidemia Mother    Thyroid disease Mother    Depression Mother    Bipolar disorder Mother    Breast cancer Mother 26   Heart disease Father    Arthritis Father        rheumatoid   Hypertension Father    Hyperlipidemia Father    Depression Sister    Mental illness Sister        bipolar   Parkinson's disease Sister    Arthritis Sister    Arthritis Sister    Arthritis Sister    Breast cancer Maternal Aunt 65   Breast cancer Maternal Uncle    Colon cancer Neg Hx    Esophageal cancer Neg Hx    Rectal cancer Neg Hx    Stomach cancer Neg Hx    Social History   Socioeconomic History   Marital status: Married    Spouse name: Not on file   Number of children: 1   Years of education: Not on file   Highest education level: Master's degree (e.g., MA, MS, MEng, MEd, MSW, MBA)  Occupational History   Occupation: Retired Teacher, music  Tobacco Use   Smoking status: Never   Smokeless tobacco: Never  Vaping Use   Vaping Use: Never used  Substance and Sexual Activity   Alcohol use: Yes    Comment: occasional wine   Drug use: No   Sexual activity: Not Currently    Partners: Male   Other Topics Concern   Not on file  Social History Narrative   Lives with husband, no major dietary restrictions, retired from teaching      Husband dx with cancer MDS June 2017.   Currently in remission. (11/03/16 pc)      Pt has one biological child the other 3 are adopted  Social Determinants of Health   Financial Resource Strain: Low Risk    Difficulty of Paying Living Expenses: Not hard at all  Food Insecurity: No Food Insecurity   Worried About Charity fundraiser in the Last Year: Never true   [employer] in the Last Year: Never true  Transportation Needs: No Transportation Needs   Lack of Transportation (Medical): No   Lack of Transportation (Non-Medical): No  Physical Activity: Sufficiently Active   Days of Exercise per Week: 5 days   Minutes of Exercise per Session: 30 min  Stress: No Stress Concern Present   Feeling of Stress : Not at all  Social Connections: Moderately Integrated   Frequency of Communication with Friends and Family: More than three times a week   Frequency of Social Gatherings with Friends and Family: More than three times a week   Attends Religious Services: Never   Marine scientist or Organizations: Yes   Attends Music therapist: More than 4 times per year   Marital Status: Married    Tobacco Counseling Counseling given: Not Answered   Clinical Intake:  Pre-visit preparation completed: Yes  Pain : No/denies pain     BMI - recorded: 41.49 Nutritional Status: BMI > 30  Obese Nutritional Risks: None Diabetes: No  How often do you need to have someone help you when you read instructions, pamphlets, or other written materials from your doctor or pharmacy?: 1 - Never  Diabetic?No  Interpreter Needed?: No  Information entered by :: Caroleen Hamman LPN   Activities of Daily Living In your present state of health, do you have any difficulty performing the following activities: 11/03/2021  Hearing? N   Vision? N  Difficulty concentrating or making decisions? N  Walking or climbing stairs? N  Dressing or bathing? N  Doing errands, shopping? N  Preparing Food and eating ? N  Using the Toilet? N  In the past six months, have you accidently leaked urine? N  Do you have problems with loss of bowel control? N  Managing your Medications? N  Managing your Finances? N  Housekeeping or managing your Housekeeping? N  Some recent data might be hidden    Patient Care Team: Mosie Lukes, MD as PCP - General (Family Medicine) Berniece Salines, DO as PCP - Cardiology (Cardiology) Tat, Eustace Quail, DO as Consulting Physician (Neurology) Tat, Eustace Quail, DO as Consulting Physician (Neurology)  Indicate any recent Medical Services you may have received from other than [facility] providers in the past year (date may be approximate).     Assessment:   This is a routine wellness examination for [facility].  Hearing/Vision screen Hearing Screening - Comments:: No issues Vision Screening - Comments:: Last eye exam-within the past year-Dr. Prudencio Burly  Dietary issues and exercise activities discussed: Current Exercise Habits: Home exercise routine;Structured exercise class, Type of exercise: strength training/weights;walking (swimming , spin class), Time (Minutes): 30, Frequency (Times/Week): 5, Weekly Exercise (Minutes/Week): 150, Intensity: Moderate   Goals Addressed             This Visit's Progress    Increase physical activity   On track    Exercise 4-6 days per week     Patient Stated   Not on track    Drink more water & lose more weight       Depression Screen PHQ 2/9 Scores 11/03/2021 10/03/2021 03/10/2021 02/24/2021 10/28/2020 06/17/2020 03/16/2020  PHQ - 2 Score 0 2 0 0 0 0 0  PHQ- 9 Score - 13 - - - 0 -    Fall Risk Fall Risk  11/03/2021 10/18/2021 04/12/2021 03/10/2021 02/24/2021  Falls in the past year? _0 0 1  Number falls in past yr: 1 1 0 0 0  Injury with Fall? 0 0 1 0 0  Risk for fall due to :  History of fall(s) - - - -  Follow up Falls prevention discussed - - Falls evaluation completed -    FALL RISK PREVENTION PERTAINING TO THE HOME:  Any stairs in or around the home? Yes  If so, are there any without handrails? No  Home free of loose throw rugs in walkways, pet beds, electrical cords, etc? Yes  Adequate lighting in your home to reduce risk of falls? Yes   ASSISTIVE DEVICES UTILIZED TO PREVENT FALLS:  Life alert? No  Use of a cane, walker or w/c? Yes  Grab bars in the bathroom? Yes  Shower chair or bench in shower? Yes  Elevated toilet seat or a handicapped toilet? Yes   TIMED UP AND GO:  Was the test performed? Yes .  Length of time to ambulate 10 feet: 12 sec.   Gait steady and fast with assistive device  Cognitive Function:Normal cognitive status assessed by direct observation by this Nurse Health Advisor. No abnormalities found.   MMSE - Mini Mental State Exam 10/15/2017  Orientation to time 5  Orientation to Place 5  Registration 3  Attention/ Calculation 5  Recall 3  Language- name 2 objects 2  Language- repeat 1  Language- follow 3 step command 3  Language- read & follow direction 1  Write a sentence 1  Copy design 1  Total score 30        Immunizations Immunization History  Administered Date(s) Administered   Fluad Quad(high Dose 65+) 05/30/2021   Influenza Split 06/19/2012   Influenza Whole 07/05/2007, 07/08/2009   Influenza, High Dose Seasonal PF 05/04/2016, 05/10/2017, 05/07/2018, 04/29/2019, 06/17/2020   Influenza,inj,Quad PF,6+ Mos 07/10/2013, 05/05/2014   PFIZER Comirnaty(Gray Top)Covid-19 Tri-Sucrose Vaccine 10/07/2019, 10/30/2019, 01/11/2021   PFIZER(Purple Top)SARS-COV-2 Vaccination 10/07/2019, 10/30/2019, 05/05/2020   Pfizer Covid-19 Vaccine Bivalent Booster 40yr & up 06/15/2021   Pneumococcal Conjugate-13 07/10/2013   Pneumococcal Polysaccharide-23 12/07/2015   Td 09/04/2004   Tdap 01/04/2015   Zoster Recombinat (Shingrix)  12/20/2017, 03/11/2018    TDAP status: Up to date  Flu Vaccine status: Up to date  Pneumococcal vaccine status: Up to date  Covid-19 vaccine status: Completed vaccines  Qualifies for Shingles Vaccine? No   Zostavax completed No   Shingrix Completed?: Yes  Screening Tests Health Maintenance  Topic Date Due   MAMMOGRAM  01/26/2023   TETANUS/TDAP  01/03/2025   Pneumonia Vaccine 72 Years old  Completed   INFLUENZA VACCINE  Completed   DEXA SCAN  Completed   COVID-19 Vaccine  Completed   Hepatitis C Screening  Completed   Zoster Vaccines- Shingrix  Completed   HPV VACCINES  Aged Out   COLONOSCOPY (Pts [phone] coverage will need to be confirmed)  Discontinued    Health Maintenance  There are no preventive care reminders to display for this patient.  Colorectal cancer screening: No longer required.   Mammogram status: Completed bilateral 01/25/2021. Repeat every year  Bone Density status: Completed 01/10/2021. Results reflect: Bone density results: NORMAL. Repeat every 2 years.  Lung Cancer Screening: (Low Dose CT Chest recommended if Age 72-80ears, 30 pack-year currently smoking OR have quit w/in 15years.) does  not qualify.     Additional Screening:  Hepatitis C Screening: Completed 12/07/2015  Vision Screening: Recommended annual ophthalmology exams for early detection of glaucoma and other disorders of the eye. Is the patient up to date with their annual eye exam?  Yes  Who is the provider or what is the name of the office in which the patient attends annual eye exams? Dr. Prudencio Burly   Dental Screening: Recommended annual dental exams for proper oral hygiene  Community Resource Referral / Chronic Care Management: CRR required this visit?  No   CCM required this visit?  No      Plan:     I have personally reviewed and noted the following in the patients chart:   Medical and social history Use of alcohol, tobacco or illicit drugs  Current  medications and supplements including opioid prescriptions.  Functional ability and status Nutritional status Physical activity Advanced directives List of other physicians Hospitalizations, surgeries, and ER visits in previous 12 months Vitals Screenings to include cognitive, depression, and falls Referrals and appointments  In addition, I have reviewed and discussed with patient certain preventive protocols, quality metrics, and best practice recommendations. A written personalized care plan for preventive services as well as general preventive health recommendations were provided to patient.     Marta Antu, LPN   11/03/7407  Nurse health Advisor  Nurse Notes: None

## 2021-11-10 ENCOUNTER — Ambulatory Visit: Payer: Medicare Other | Admitting: Occupational Therapy

## 2021-11-10 ENCOUNTER — Other Ambulatory Visit: Payer: Self-pay

## 2021-11-10 ENCOUNTER — Ambulatory Visit: Payer: Medicare Other | Attending: Neurology | Admitting: Physical Therapy

## 2021-11-10 DIAGNOSIS — M6281 Muscle weakness (generalized): Secondary | ICD-10-CM

## 2021-11-10 DIAGNOSIS — R278 Other lack of coordination: Secondary | ICD-10-CM | POA: Diagnosis not present

## 2021-11-10 DIAGNOSIS — G20 Parkinson's disease: Secondary | ICD-10-CM | POA: Diagnosis not present

## 2021-11-10 DIAGNOSIS — R2681 Unsteadiness on feet: Secondary | ICD-10-CM

## 2021-11-10 DIAGNOSIS — R29818 Other symptoms and signs involving the nervous system: Secondary | ICD-10-CM

## 2021-11-10 DIAGNOSIS — R293 Abnormal posture: Secondary | ICD-10-CM

## 2021-11-10 DIAGNOSIS — R2689 Other abnormalities of gait and mobility: Secondary | ICD-10-CM

## 2021-11-10 NOTE — Therapy (Signed)
[facility] [address], [place], [place], [address] Phone: [phone]   Fax:  [phone]  Physical Therapy Treatment  Patient Details  Name: Kristin [name] MRN: [id number] Date of Birth: September 20, 1950 Referring Provider (PT): Tat, [name]   Encounter Date: 11/10/2021   PT End of Session - 11/10/21 1116     Visit Number 1    Number of Visits 13    Date for PT Re-Evaluation 12/23/21    Authorization Type Medicare/[employer]    PT Start Time 1109   Pt arrived late   PT Stop Time 1147    PT Time Calculation (min) 38 min    Activity Tolerance Patient tolerated treatment well    Behavior During Therapy [facility] for tasks assessed/performed   Fearful at times with balance activities in MiniBESTest            Past Medical History:  Diagnosis Date   Abnormal vaginal Pap smear    Acute pharyngitis 02/25/2013   Anemia    Anxiety    Arthritis    BCC (basal cell carcinoma of skin) 10/05/2014   On back   Chicken pox as a child   Chronic UTI    sees dr Terance Hart   Constipation 12/07/2015   Depression with anxiety 11/02/2009   Qualifier: Diagnosis of  By: [name], LPN, Bonnye M    Dermatitis 07/17/2012   Esophageal stricture 1994   Fibroids    Foot pain, bilateral 06/19/2012   GERD (gastroesophageal reflux disease)    GERD (gastroesophageal reflux disease)    Hiatal hernia    Hyperglycemia 08/19/2013   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   Hyperlipidemia    Hypertension    Infertility, female    Low back pain 10/17/2007   Qualifier: Diagnosis of  By: [name], [name]    Measles as a child   Obesity    Osteoarthritis    Parkinson disease ([place])    Plantar fasciitis of left foot 06/19/2012   Preventative health care 12/19/2015   Rheumatoid arthritis ([place]) 01/08/2018   Rosacea 10/05/2014   Swallowing difficulty    Urinary frequency 02/25/2013   Visual floaters 05/11/2014    Past Surgical History:  Procedure Laterality Date    ABDOMINAL HYSTERECTOMY  2006   total   esophageal     stretching   HERNIA REPAIR N/A    laporoscopy     LEFT HEART CATH AND CORONARY ANGIOGRAPHY N/A 06/02/2019   Procedure: LEFT HEART CATH AND CORONARY ANGIOGRAPHY;  Surgeon: Jettie Booze, MD;  Location: [facility];  Service: Cardiovascular;  Laterality: N/A;   TONSILLECTOMY     TOTAL HIP ARTHROPLASTY Right 2010   wisdom teeth extracted      There were no vitals filed for this visit.   Subjective Assessment - 11/10/21 1112     Subjective In my mind, I know I'm not having any back pain, but my biggest problem is balance.  I get tired with walking and realize that I'm tired when I stop.  Use the walking pole.  Walk in the neighborhood, 20 minutes, about 4x/wk.  Have had 3 falls in the past 6 month-getting out of the car, in the grocery store parking lot, and then at daughter's house.  The last two were turning and looking and couldn't catch my balance to get up.    Patient Stated Goals Pt's goals for therapy are to improve stairs, balance, walking.    Currently  in Pain? No/denies                [facility] PT Assessment - 11/10/21 1118       Assessment   Medical Diagnosis Parkinson's disease    Referring Provider (PT) Tat, [name]    Onset Date/Surgical Date 10/13/21   MD order   Hand Dominance Left      Precautions   Precautions Fall      Balance Screen   Has the patient fallen in the past 6 months Yes    How many times? 3    Has the patient had a decrease in activity level because of a fear of falling?  No    Is the patient reluctant to leave their home because of a fear of falling?  No      Home Social worker Private residence    Living Arrangements Spouse/significant other    Available Help at Discharge Family    Type of [place] to enter    Entrance Stairs-Number of Steps 1    Entrance Stairs-Rails None    Home Layout Two level;Able to live on main level with  bedroom/bathroom    Alternate Level Stairs-Number of Steps 13    Alternate Level Stairs-Rails Right    Additional Comments Reports she no longer goes upstairs      Prior Function   Level of Independence Independent    Vocation Retired    Leisure Goes to ACT/swimming Mondays, [place], Wed-[employer], Thurs-ACT and sometimes spin (goal to get back regularly); Friday-[employer]   Goal to walk everyday     Cognition   Behaviors Other (comment)   Reports increased nervousness with activities     Observation/Other Assessments   Focus on Therapeutic Outcomes (FOTO)  NA      Posture/Postural Control   Posture/Postural Control Postural limitations    Postural Limitations Rounded Shoulders      ROM / Strength   AROM / PROM / Strength AROM;Strength      AROM   Overall AROM  Within functional limits for tasks performed      Strength   Overall Strength Deficits    Strength Assessment Site Knee;Hip;Ankle    Right/Left Hip Right;Left    Right Hip Flexion 5/5    Left Hip Flexion 5/5    Right/Left Knee Right;Left    Right Knee Flexion 4+/5    Right Knee Extension 4+/5    Left Knee Flexion 5/5    Left Knee Extension 5/5    Right/Left Ankle Right;Left    Right Ankle Dorsiflexion 3+/5    Right Ankle Inversion 4/5    Right Ankle Eversion 4/5    Left Ankle Dorsiflexion 4/5      Transfers   Transfers Sit to Stand;Stand to Sit    Sit to Stand 6: Modified independent (Device/Increase time);With upper extremity assist;From chair/3-in-1    Five time sit to stand comments  12.63    Stand to Sit 6: Modified independent (Device/Increase time);Without upper extremity assist;To chair/3-in-1      Ambulation/Gait   Ambulation/Gait Yes    Ambulation/Gait Assistance 6: Modified independent (Device/Increase time)    Assistive device None;Other (Comment)   single walking pole   Gait Pattern Step-through pattern;Decreased arm swing - right;Decreased step length - right;Decreased weight shift to  right   increased inversion, supination upon heelstrike on RLE   Ambulation Surface Level;Indoor    Gait velocity 12.56 sec= 2.59  ft/sec      Standardized Balance Assessment   Standardized Balance Assessment Timed Up and Go Test;Mini-BESTest      Mini-BESTest   Sit To Stand Normal: Comes to stand without use of hands and stabilizes independently.    Rise to Toes < 3 s.   1.03   Stand on one leg (left) Moderate: < 20 s   3.47, 1.72   Stand on one leg (right) Moderate: < 20 s   2.35, 1.06   Stand on one leg - lowest score 1    Compensatory Stepping Correction - Forward Normal: Recovers independently with a single, large step (second realignement is allowed).    Compensatory Stepping Correction - Backward Normal: Recovers independently with a single, large step    Compensatory Stepping Correction - Left Lateral Normal: Recovers independently with 1 step (crossover or lateral OK)    Compensatory Stepping Correction - Right Lateral Normal: Recovers independently with 1 step (crossover or lateral OK)    Stepping Corredtion Lateral - lowest score 2    Stance - Feet together, eyes open, firm surface  Normal: 30s    Stance - Feet together, eyes closed, foam surface  Moderate: < 30s   13 sec   Incline - Eyes Closed Normal: Stands independently 30s and aligns with gravity    Change in Gait Speed Normal: Significantly changes walkling speed without imbalance    Walk with head turns - Horizontal Moderate: performs head turns with reduction in gait speed.    Walk with pivot turns Moderate:Turns with feet close SLOW (>4 steps) with good balance.   3.69   Step over obstacles Moderate: Steps over box but touches box OR displays cautious behavior by slowing gait.    Timed UP & GO with Dual Task Normal: No noticeable change in sitting, standing or walking while backward counting when compared to TUG without    Mini-BEST total score 21      Timed Up and Go Test   Normal TUG (seconds) 11.63    Manual TUG  (seconds) 12.69    Cognitive TUG (seconds) 12.28    TUG Comments Scores >13.5 sec indicate increased fall risk.                                      PT Short Term Goals - 11/10/21 1232       PT SHORT TERM GOAL #1   Title Pt will be independent with HEP for improved strength, balance, transfers, and gait.  TARGET 12/09/2021    Time 4    Period Weeks    Status New      PT SHORT TERM GOAL #2   Title Pt will improve single limb stance to 3 sec bilaterally for improved strength to assist with stair and obstacle negotiation.    Time 4    Period Weeks    Status New               PT Long Term Goals - 11/10/21 1234       PT LONG TERM GOAL #1   Title Pt will be independent with progression of/final HEP for improved functional mobility, strength, balance.  TARGET 12/23/2021    Time 6    Period Weeks    Status New      PT LONG TERM GOAL #2   Title Pt will improve 5x sit<>stand to less than or equal to  11.5 seconds for improved functional strength.    Baseline 12.63 sec    Time 6    Period Weeks    Status New      PT LONG TERM GOAL #3   Title Pt will improve MiniBESTest score to at least 23/28 sec for decreased fall risk.    Baseline 21/28    Time 6    Period Weeks    Status New      PT LONG TERM GOAL #4   Title Pt will report at least 50% improvement in stair negotiation with handrail, mod I for improved stair negotiation to second level of home.    Time 6    Period Weeks    Status New                   Plan - 11/10/21 1225     Clinical Impression Statement Pt is a 71 year old female with history of Parkinson's disease, who presents today for return PT eval after previous bout of d/c from PT in September 2022.  Since that time, she has had 3 reported falls, two of which were with quick turns/changes of direction.  Also compared to d/c measures, pt demonstrates decreased gait velocity.  She is at fall risk per MiniBESTest score.  She  presents with decreased strength, decreased balance, decreased gait timing and coordination, and she reports increased fear of stair negotiation.  She is active in the community and with grandkids, and she would benefit from skilled physical therapy to address the above stated deficits to decrease fall risk and improve overall functional mobiltiy.    Personal Factors and Comorbidities Comorbidity 3+    Comorbidities See problem list    Examination-Activity Limitations Locomotion Level;Transfers;Stairs;Stand    Examination-Participation Restrictions Community Activity;Other   Fitness activities   Stability/Clinical Decision Making Stable/Uncomplicated    Clinical Decision Making Low    Rehab Potential Good    PT Frequency 2x / week    PT Duration 6 weeks   plus eval   PT Treatment/Interventions ADLs/Self Care Home Management;Gait training;Stair training;Functional mobility training;Therapeutic activities;Therapeutic exercise;Balance training;Neuromuscular re-education;Patient/family education;DME Instruction    PT Next Visit Plan Initiate HEP for balance-need to work on SLS, ankle strength, RLE functional strength, stair negotiation    Consulted and Agree with Plan of Care Patient             Patient will benefit from skilled therapeutic intervention in order to improve the following deficits and impairments:  Abnormal gait, Difficulty walking, Decreased balance, Decreased mobility, Decreased strength, Postural dysfunction, Decreased coordination  Visit Diagnosis: Unsteadiness on feet  Muscle weakness (generalized)  Other abnormalities of gait and mobility  Other symptoms and signs involving the nervous system  Abnormal posture     Problem List Patient Active Problem List   Diagnosis Date Noted   Vitamin B 12 deficiency 05/30/2021   Dry mouth 02/24/2021   Anemia 03/16/2020   Coronary artery disease involving native coronary artery of native heart without angina pectoris  06/12/2019   Abnormal stress test    Diastolic dysfunction 91/47/8295   Cough 05/10/2018   Rheumatoid arthritis ([place]) 01/08/2018   Hand pain, right 10/15/2017   Vitamin D deficiency 12/20/2016   Prediabetes 12/20/2016   Shortness of breath on exertion 11/27/2016   Preventative health care 12/19/2015   Constipation 12/07/2015   BCC (basal cell carcinoma of skin) 10/05/2014   Rosacea 10/05/2014   Parkinson's disease ([place]) 05/11/2014   Paralysis agitans ([place]) 01/08/2014  Screening for cervical cancer 07/17/2012   Foot pain, bilateral 06/19/2012   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, [place], [place], [address] Phone: [phone]   Fax:  [phone]  Name: Kristin [name] MRN: [id number] Date of Birth: 1951/08/23

## 2021-11-10 NOTE — Therapy (Signed)
[facility] ?[facility] ?[address] ?[place], [place], [address] ?Phone: [phone]   Fax:  [phone] ? ?Patient Details  ?Name: Kristin [name] ?MRN: [id number] ?Date of Birth: [birth year] ?Referring Provider:  Ludwig Clarks, DO ? ?Encounter Date: 11/10/2021 ? ?Occupational Therapy Parkinson's Disease Screen ? ?Hand dominance:  left  ? ?Fastening/unfastening 3 buttons in:  19.0 sec ? ?9-hole peg test:    RUE  28.75 sec  (dropped one)       LUE  20.78 sec ? ?Box & Blocks Test:   RUE  59 blocks        LUE  59 blocks ? ?Change in ability to perform ADLs/IADLs:   no, pt reports still having intermittent difficulty with smaller buttons and placing back on earring, but is using strategies from previous therapy ? ? ?Pt does not require occupational therapy services at this time.  Recommended occupational therapy screen in   6-8 months ? ? ? Kristin [name], [name] ?11/10/2021, 11:49 AM ? ?[facility] ?[facility] ?[address], [address] ?[place], [place], [address] ?Phone: [phone]   Fax:  [phone] ?

## 2021-11-14 ENCOUNTER — Ambulatory Visit: Payer: Medicare Other | Admitting: Physical Therapy

## 2021-11-14 ENCOUNTER — Encounter: Payer: Self-pay | Admitting: Physical Therapy

## 2021-11-14 ENCOUNTER — Other Ambulatory Visit: Payer: Self-pay

## 2021-11-14 DIAGNOSIS — R29818 Other symptoms and signs involving the nervous system: Secondary | ICD-10-CM | POA: Diagnosis not present

## 2021-11-14 DIAGNOSIS — M6281 Muscle weakness (generalized): Secondary | ICD-10-CM

## 2021-11-14 DIAGNOSIS — R2681 Unsteadiness on feet: Secondary | ICD-10-CM | POA: Diagnosis not present

## 2021-11-14 DIAGNOSIS — R293 Abnormal posture: Secondary | ICD-10-CM | POA: Diagnosis not present

## 2021-11-14 DIAGNOSIS — R278 Other lack of coordination: Secondary | ICD-10-CM | POA: Diagnosis not present

## 2021-11-14 DIAGNOSIS — R2689 Other abnormalities of gait and mobility: Secondary | ICD-10-CM | POA: Diagnosis not present

## 2021-11-14 NOTE — Therapy (Signed)
[facility] [address] [place], [place], [address] Phone: [phone]   Fax:  [phone]  Physical Therapy Treatment  Patient Details  Name: Kristin [name] MRN: [id number] Date of Birth: 12-Feb-1951 Referring Provider (PT): Tat, [name]   Encounter Date: 11/14/2021   PT End of Session - 11/14/21 1613     Visit Number 2    Number of Visits 13    Date for PT Re-Evaluation 12/23/21    Authorization Type Medicare/[employer]    PT Start Time 1533    PT Stop Time 1612    PT Time Calculation (min) 39 min    Activity Tolerance Patient tolerated treatment well    Behavior During Therapy [facility] for tasks assessed/performed   Fearful at times with balance activities in MiniBESTest            Past Medical History:  Diagnosis Date   Abnormal vaginal Pap smear    Acute pharyngitis 02/25/2013   Anemia    Anxiety    Arthritis    BCC (basal cell carcinoma of skin) 10/05/2014   On back   Chicken pox as a child   Chronic UTI    sees dr Terance Hart   Constipation 12/07/2015   Depression with anxiety 11/02/2009   Qualifier: Diagnosis of  By: [name], LPN, Bonnye M    Dermatitis 07/17/2012   Esophageal stricture 1994   Fibroids    Foot pain, bilateral 06/19/2012   GERD (gastroesophageal reflux disease)    GERD (gastroesophageal reflux disease)    Hiatal hernia    Hyperglycemia 08/19/2013   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   Hyperlipidemia    Hypertension    Infertility, female    Low back pain 10/17/2007   Qualifier: Diagnosis of  By: [name], [name]    Measles as a child   Obesity    Osteoarthritis    Parkinson disease ([place])    Plantar fasciitis of left foot 06/19/2012   Preventative health care 12/19/2015   Rheumatoid arthritis ([place]) 01/08/2018   Rosacea 10/05/2014   Swallowing difficulty    Urinary frequency 02/25/2013   Visual floaters 05/11/2014    Past Surgical History:  Procedure Laterality Date   ABDOMINAL HYSTERECTOMY   2006   total   esophageal     stretching   HERNIA REPAIR N/A    laporoscopy     LEFT HEART CATH AND CORONARY ANGIOGRAPHY N/A 06/02/2019   Procedure: LEFT HEART CATH AND CORONARY ANGIOGRAPHY;  Surgeon: Jettie Booze, MD;  Location: [facility];  Service: Cardiovascular;  Laterality: N/A;   TONSILLECTOMY     TOTAL HIP ARTHROPLASTY Right 2010   wisdom teeth extracted      There were no vitals filed for this visit.   Subjective Assessment - 11/14/21 1539     Subjective Denies falls. Nothing new    Patient Stated Goals Pt's goals for therapy are to improve stairs, balance, walking.    Currently in Pain? No/denies                               [facility] Adult PT Treatment/Exercise - 11/14/21 0001       Neuro Re-ed    Neuro Re-ed Details  alt toe tap on 6" step 2x20 with minor UE support, R/L fwd/back stepping over 1/2 foam roll 10x with 1 UE support      Exercises  Exercises Knee/Hip      Knee/Hip Exercises: Stretches   Gastroc Stretch Right;2 reps;30 seconds    Gastroc Stretch Limitations runner's stretch      Knee/Hip Exercises: Aerobic   Nustep L5 x 6 min (UEs/LEs)   educated on adjustments and display to allow her to use this machine at [employer]     Knee/Hip Exercises: Standing   Forward Step Up Left;1 set;10 reps;Hand Hold: 1;Step Height: 4"    Forward Step Up Limitations cues for improved eccentric control on step back      Knee/Hip Exercises: Seated   Sit to Sand 1 set;10 reps;without UE support   R foot back; cues to improve ecentric control                    PT Education - 11/14/21 1613     Education Details update to HEP- Access Code: [employer]) Educated Patient    Methods Demonstration;Explanation;Tactile cues;Verbal cues;Handout    Comprehension Verbalized understanding;Returned demonstration              PT Short Term Goals - 11/14/21 1715       PT SHORT TERM GOAL #1   Title Pt will be  independent with HEP for improved strength, balance, transfers, and gait.  TARGET 12/09/2021    Time 4    Period Weeks    Status On-going      PT SHORT TERM GOAL #2   Title Pt will improve single limb stance to 3 sec bilaterally for improved strength to assist with stair and obstacle negotiation.    Time 4    Period Weeks    Status On-going               PT Long Term Goals - 11/14/21 1716       PT LONG TERM GOAL #1   Title Pt will be independent with progression of/final HEP for improved functional mobility, strength, balance.  TARGET 12/23/2021    Time 6    Period Weeks    Status On-going      PT LONG TERM GOAL #2   Title Pt will improve 5x sit<>stand to less than or equal to 11.5 seconds for improved functional strength.    Baseline 12.63 sec    Time 6    Period Weeks    Status On-going      PT LONG TERM GOAL #3   Title Pt will improve MiniBESTest score to at least 23/28 sec for decreased fall risk.    Baseline 21/28    Time 6    Period Weeks    Status On-going      PT LONG TERM GOAL #4   Title Pt will report at least 50% improvement in stair negotiation with handrail, mod I for improved stair negotiation to second level of home.    Time 6    Period Weeks    Status On-going                   Plan - 11/14/21 1613     Clinical Impression Statement Patient arrived to session without new complaints. Educated patient on adjustments and display on Nustep to allow her to use this machine at [employer]. Patient performed STS transfers with R LE bias with cues to improve eccentric control. SLS activities and step ups required minor UE support d/t hesitancy, especially with R LE stabilizing. Marked atrophy of R vs. L calf evident and patient with report  of sensation of weakness in the R calf with heel raises. Updated todays activities into HEP- patient reported understanding and without complaints at end of session.    Personal Factors and Comorbidities Comorbidity 3+     Comorbidities See problem list    Examination-Activity Limitations Locomotion Level;Transfers;Stairs;Stand    Examination-Participation Restrictions Community Activity;Other   Fitness activities   Stability/Clinical Decision Making Stable/Uncomplicated    Rehab Potential Good    PT Frequency 2x / week    PT Duration 6 weeks   plus eval   PT Treatment/Interventions ADLs/Self Care Home Management;Gait training;Stair training;Functional mobility training;Therapeutic activities;Therapeutic exercise;Balance training;Neuromuscular re-education;Patient/family education;DME Instruction    PT Next Visit Plan reassess HEP for balance-need to work on SLS, ankle strength, RLE functional strength, stair negotiation    Consulted and Agree with Plan of Care Patient             Patient will benefit from skilled therapeutic intervention in order to improve the following deficits and impairments:  Abnormal gait, Difficulty walking, Decreased balance, Decreased mobility, Decreased strength, Postural dysfunction, Decreased coordination  Visit Diagnosis: Unsteadiness on feet  Muscle weakness (generalized)  Other abnormalities of gait and mobility     Problem List Patient Active Problem List   Diagnosis Date Noted   Vitamin B 12 deficiency 05/30/2021   Dry mouth 02/24/2021   Anemia 03/16/2020   Coronary artery disease involving native coronary artery of native heart without angina pectoris 06/12/2019   Abnormal stress test    Diastolic dysfunction 40/04/6760   Cough 05/10/2018   Rheumatoid arthritis ([place]) 01/08/2018   Hand pain, right 10/15/2017   Vitamin D deficiency 12/20/2016   Prediabetes 12/20/2016   Shortness of breath on exertion 11/27/2016   Preventative health care 12/19/2015   Constipation 12/07/2015   BCC (basal cell carcinoma of skin) 10/05/2014   Rosacea 10/05/2014   Parkinson's disease ([place]) 05/11/2014   Paralysis agitans ([place]) 01/08/2014   Screening for cervical cancer  07/17/2012   Foot pain, bilateral 06/19/2012   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 [place], [place], [address] Phone: [phone]   Fax:  [phone]  Name: DERRICA SIEG MRN: [id number] Date of Birth: 1950/09/16

## 2021-11-15 ENCOUNTER — Telehealth: Payer: Self-pay | Admitting: Family Medicine

## 2021-11-15 NOTE — Telephone Encounter (Signed)
Please find out the names of her husband and son. ?

## 2021-11-15 NOTE — Telephone Encounter (Signed)
Pt is asking to become a pt of Dr Yong Channel stating both her husband and son are patients. ?

## 2021-11-16 ENCOUNTER — Ambulatory Visit: Payer: Medicare Other

## 2021-11-16 ENCOUNTER — Other Ambulatory Visit: Payer: Self-pay

## 2021-11-16 DIAGNOSIS — R2681 Unsteadiness on feet: Secondary | ICD-10-CM | POA: Diagnosis not present

## 2021-11-16 DIAGNOSIS — R2689 Other abnormalities of gait and mobility: Secondary | ICD-10-CM

## 2021-11-16 DIAGNOSIS — R293 Abnormal posture: Secondary | ICD-10-CM

## 2021-11-16 DIAGNOSIS — M6281 Muscle weakness (generalized): Secondary | ICD-10-CM

## 2021-11-16 DIAGNOSIS — R29818 Other symptoms and signs involving the nervous system: Secondary | ICD-10-CM | POA: Diagnosis not present

## 2021-11-16 DIAGNOSIS — G20 Parkinson's disease: Secondary | ICD-10-CM

## 2021-11-16 DIAGNOSIS — R278 Other lack of coordination: Secondary | ICD-10-CM | POA: Diagnosis not present

## 2021-11-16 NOTE — Telephone Encounter (Signed)
Called and lvm to schedule a new pt appt. ?

## 2021-11-16 NOTE — Telephone Encounter (Signed)
Kristin [name] and Kristin [name] are both pts of [place] ?

## 2021-11-16 NOTE — Therapy (Signed)
[place] ?[address], [address] ?[place], [place], [address] ?Phone: [phone]   Fax:  [phone] ? ?Physical Therapy Treatment ? ?Patient Details  ?Name: Kristin [name] ?MRN: [id number] ?Date of Birth: March 15, 1951 ?Referring Provider (PT): Tat, [name] ? ? ?Encounter Date: 11/16/2021 ? ? PT End of Session - 11/16/21 1542   ? ? Visit Number 3   ? Number of Visits 13   ? Date for PT Re-Evaluation 12/23/21   ? Authorization Type Medicare/[employer]   ? PT Start Time 1530   ? PT Stop Time 1615   ? PT Time Calculation (min) 45 min   ? Activity Tolerance Patient tolerated treatment well   ? Behavior During Therapy [facility] for tasks assessed/performed   Fearful at times with balance activities in MiniBESTest  ? ?  ?  ? ?  ? ? ?Past Medical History:  ?Diagnosis Date  ? Abnormal vaginal Pap smear   ? Acute pharyngitis 02/25/2013  ? Anemia   ? Anxiety   ? Arthritis   ? BCC (basal cell carcinoma of skin) 10/05/2014  ? On back  ? Chicken pox as a child  ? Chronic UTI   ? sees dr Terance Hart  ? Constipation 12/07/2015  ? Depression with anxiety 11/02/2009  ? Qualifier: Diagnosis of  By: [name] LPN, [name]   ? Dermatitis 07/17/2012  ? Esophageal stricture 1994  ? Fibroids   ? Foot pain, bilateral 06/19/2012  ? GERD (gastroesophageal reflux disease)   ? GERD (gastroesophageal reflux disease)   ? Hiatal hernia   ? Hyperglycemia 08/19/2013  ? 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 Hyperlipidemia   ? Hypertension   ? Infertility, female   ? Low back pain 10/17/2007  ? Qualifier: Diagnosis of  By: [name], [name]   ? Measles as a child  ? Obesity   ? Osteoarthritis   ? Parkinson disease ([place])   ? Plantar fasciitis of left foot 06/19/2012  ? Preventative health care 12/19/2015  ? Rheumatoid arthritis ([place]) 01/08/2018  ? Rosacea 10/05/2014  ? Swallowing difficulty   ? Urinary frequency 02/25/2013  ? Visual floaters 05/11/2014  ? ? ?Past Surgical History:  ?Procedure Laterality Date  ? ABDOMINAL HYSTERECTOMY   2006  ? total  ? esophageal    ? stretching  ? HERNIA REPAIR N/A   ? laporoscopy    ? LEFT HEART CATH AND CORONARY ANGIOGRAPHY N/A 06/02/2019  ? Procedure: LEFT HEART CATH AND CORONARY ANGIOGRAPHY;  Surgeon: Jettie Booze, MD;  Location: [facility];  Service: Cardiovascular;  Laterality: N/A;  ? TONSILLECTOMY    ? TOTAL HIP ARTHROPLASTY Right 2010  ? wisdom teeth extracted    ? ? ?There were no vitals filed for this visit. ? ? Subjective Assessment - 11/16/21 1534   ? ? Subjective Anterior right knee pain since experienced a fall, at first it felt like fluid and now it is hard.  It is painful to kneel on it which limits ability to get on/off floor   ? Patient Stated Goals Pt's goals for therapy are to improve stairs, balance, walking.   ? ?  ?  ? ?  ? ? ? ? ? ? ? ? ? ? ? ? ? ? ? ? ? ? ? ? [facility] Adult PT Treatment/Exercise - 11/16/21 0001   ? ?  ? Neuro Re-ed   ? Neuro Re-ed Details  alt toe tap on 6" step 2x20 with  minor UE support, R/L fwd/back stepping over 1/2 foam roll 10x with 1 UE support. offset standing static balance eyes closed 3x15 sec for balance/proprioception. Forward/backward, right/left  BUE support, single UE support and no UE support over 6" hurdles 3x5 reps. Unilateral carry/march 5# 4x20 ft for single limb support and trunk strength   ?  ? Knee/Hip Exercises: Standing  ? Forward Step Up Both;1 set;10 reps;Hand Hold: 1   ? Forward Step Up Limitations cues for improved eccentric control on step back   ?  ? Knee/Hip Exercises: Seated  ? Other Seated Knee/Hip Exercises quad set in long sitting 3x10 reps RLE   ? ?  ?  ? ?  ? ? ? ? ? ? ? ? ? ? ? ? PT Short Term Goals - 11/14/21 1715   ? ?  ? PT SHORT TERM GOAL #1  ? Title Pt will be independent with HEP for improved strength, balance, transfers, and gait.  TARGET 12/09/2021   ? Time 4   ? Period Weeks   ? Status On-going   ?  ? PT SHORT TERM GOAL #2  ? Title Pt will improve single limb stance to 3 sec bilaterally for improved strength to  assist with stair and obstacle negotiation.   ? Time 4   ? Period Weeks   ? Status On-going   ? ?  ?  ? ?  ? ? ? ? PT Long Term Goals - 11/14/21 1716   ? ?  ? PT LONG TERM GOAL #1  ? Title Pt will be independent with progression of/final HEP for improved functional mobility, strength, balance.  TARGET 12/23/2021   ? Time 6   ? Period Weeks   ? Status On-going   ?  ? PT LONG TERM GOAL #2  ? Title Pt will improve 5x sit<>stand to less than or equal to 11.5 seconds for improved functional strength.   ? Baseline 12.63 sec   ? Time 6   ? Period Weeks   ? Status On-going   ?  ? PT LONG TERM GOAL #3  ? Title Pt will improve MiniBESTest score to at least 23/28 sec for decreased fall risk.   ? Baseline 21/28   ? Time 6   ? Period Weeks   ? Status On-going   ?  ? PT LONG TERM GOAL #4  ? Title Pt will report at least 50% improvement in stair negotiation with handrail, mod I for improved stair negotiation to second level of home.   ? Time 6   ? Period Weeks   ? Status On-going   ? ?  ?  ? ?  ? ? ? ? ? ? ? ? Plan - 11/16/21 1628   ? ? Clinical Impression Statement Assessment of anterior right knee reveals no discomfort to palpation, no tenderness inferior pole patella, multi-range quad isometrics do not elicit pain. No patellar pain to tapping/percussion and notes main issue is kneeling on affected right knee even on soft mattress.  Advised to try her typical anti-inflammatory regiment, warm compress, and quad set isometrics to see if this will de-sensitize the anterior right knee. Demonstrating improved obstacle negotiation with ability to progress stepping over 3" obstacle to 6" barrier with occasional cues for sequence, foot placement, and body position. Patient demonstrates good self-monitoring and ability to implement corrective feedback with occasional cue.  Continued with program development to improve balance, proprioception, general strength, and body mechanics to reduce risk for falls and  improve overall mobility.   ?  Personal Factors and Comorbidities Comorbidity 3+   ? Comorbidities See problem list   ? Examination-Activity Limitations Locomotion Level;Transfers;Stairs;Stand   ? Examination-Participation Restrictions Community Activity;Other   Fitness activities  ? Stability/Clinical Decision Making Stable/Uncomplicated   ? Rehab Potential Good   ? PT Frequency 2x / week   ? PT Duration 6 weeks   plus eval  ? PT Treatment/Interventions ADLs/Self Care Home Management;Gait training;Stair training;Functional mobility training;Therapeutic activities;Therapeutic exercise;Balance training;Neuromuscular re-education;Patient/family education;DME Instruction   ? PT Next Visit Plan reassess HEP for balance-need to work on SLS, ankle strength, RLE functional strength, stair negotiation   ? PT Home Exercise Plan quad set for anterior right knee pain   ? Consulted and Agree with Plan of Care Patient   ? ?  ?  ? ?  ? ? ?Patient will benefit from skilled therapeutic intervention in order to improve the following deficits and impairments:  Abnormal gait, Difficulty walking, Decreased balance, Decreased mobility, Decreased strength, Postural dysfunction, Decreased coordination ? ?Visit Diagnosis: ?Unsteadiness on feet ? ?Muscle weakness (generalized) ? ?Other abnormalities of gait and mobility ? ?Other lack of coordination ? ?Other symptoms and signs involving the nervous system ? ?Abnormal posture ? ?Parkinson's disease ([place]) ? ? ? ? ?Problem List ?Patient Active Problem List  ? Diagnosis Date Noted  ? Vitamin B 12 deficiency 05/30/2021  ? Dry mouth 02/24/2021  ? Anemia 03/16/2020  ? Coronary artery disease involving native coronary artery of native heart without angina pectoris 06/12/2019  ? Abnormal stress test   ? Diastolic dysfunction 16/06/9603  ? Cough 05/10/2018  ? Rheumatoid arthritis ([place]) 01/08/2018  ? Hand pain, right 10/15/2017  ? Vitamin D deficiency 12/20/2016  ? Prediabetes 12/20/2016  ? Shortness of breath on exertion  11/27/2016  ? Preventative health care 12/19/2015  ? Constipation 12/07/2015  ? BCC (basal cell carcinoma of skin) 10/05/2014  ? Rosacea 10/05/2014  ? Parkinson's disease ([place]) 05/11/2014  ? Paralysis agitans (

## 2021-11-16 NOTE — Telephone Encounter (Signed)
Ok to schedule pt for new pt, notate parents name in appointment note. ?

## 2021-11-18 ENCOUNTER — Other Ambulatory Visit: Payer: Self-pay | Admitting: Family Medicine

## 2021-11-21 ENCOUNTER — Ambulatory Visit: Payer: Medicare Other | Admitting: Physical Therapy

## 2021-11-22 ENCOUNTER — Other Ambulatory Visit: Payer: Self-pay | Admitting: Neurology

## 2021-11-22 DIAGNOSIS — G20 Parkinson's disease: Secondary | ICD-10-CM

## 2021-11-24 ENCOUNTER — Other Ambulatory Visit: Payer: Self-pay

## 2021-11-24 ENCOUNTER — Ambulatory Visit: Payer: Medicare Other | Admitting: Physical Therapy

## 2021-11-24 DIAGNOSIS — R2681 Unsteadiness on feet: Secondary | ICD-10-CM | POA: Diagnosis not present

## 2021-11-24 DIAGNOSIS — R2689 Other abnormalities of gait and mobility: Secondary | ICD-10-CM | POA: Diagnosis not present

## 2021-11-24 DIAGNOSIS — M6281 Muscle weakness (generalized): Secondary | ICD-10-CM | POA: Diagnosis not present

## 2021-11-24 DIAGNOSIS — R293 Abnormal posture: Secondary | ICD-10-CM | POA: Diagnosis not present

## 2021-11-24 DIAGNOSIS — R29818 Other symptoms and signs involving the nervous system: Secondary | ICD-10-CM | POA: Diagnosis not present

## 2021-11-24 DIAGNOSIS — R278 Other lack of coordination: Secondary | ICD-10-CM | POA: Diagnosis not present

## 2021-11-24 NOTE — Therapy (Signed)
[place] [facility] ?[address], [address] ?[place], [place], [address] ?Phone: [phone]   Fax:  [phone] ? ?Physical Therapy Treatment ? ?Patient Details  ?Name: Kristin [name] ?MRN: [id number] ?Date of Birth: 03-08-1951 ?Referring Provider (PT): Tat, [name] ? ? ?Encounter Date: 11/24/2021 ? ? PT End of Session - 11/24/21 1559   ? ? Visit Number 4   ? Number of Visits 13   ? Date for PT Re-Evaluation 12/23/21   ? Authorization Type Medicare/[employer]   ? PT Start Time 1450   ? PT Stop Time 1530   ? PT Time Calculation (min) 40 min   ? Activity Tolerance Patient tolerated treatment well   ? Behavior During Therapy [facility] for tasks assessed/performed   Fearful at times with balance activities in MiniBESTest  ? ?  ?  ? ?  ? ? ?Past Medical History:  ?Diagnosis Date  ? Abnormal vaginal Pap smear   ? Acute pharyngitis 02/25/2013  ? Anemia   ? Anxiety   ? Arthritis   ? BCC (basal cell carcinoma of skin) 10/05/2014  ? On back  ? Chicken pox as a child  ? Chronic UTI   ? sees dr Terance Hart  ? Constipation 12/07/2015  ? Depression with anxiety 11/02/2009  ? Qualifier: Diagnosis of  By: [name] LPN, [name]   ? Dermatitis 07/17/2012  ? Esophageal stricture 1994  ? Fibroids   ? Foot pain, bilateral 06/19/2012  ? GERD (gastroesophageal reflux disease)   ? GERD (gastroesophageal reflux disease)   ? Hiatal hernia   ? Hyperglycemia 08/19/2013  ? 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 Hyperlipidemia   ? Hypertension   ? Infertility, female   ? Low back pain 10/17/2007  ? Qualifier: Diagnosis of  By: [name], [name]   ? Measles as a child  ? Obesity   ? Osteoarthritis   ? Parkinson disease ([place])   ? Plantar fasciitis of left foot 06/19/2012  ? Preventative health care 12/19/2015  ? Rheumatoid arthritis ([place]) 01/08/2018  ? Rosacea 10/05/2014  ? Swallowing difficulty   ? Urinary frequency 02/25/2013  ? Visual floaters 05/11/2014  ? ? ?Past Surgical History:  ?Procedure Laterality Date  ? ABDOMINAL HYSTERECTOMY   2006  ? total  ? esophageal    ? stretching  ? HERNIA REPAIR N/A   ? laporoscopy    ? LEFT HEART CATH AND CORONARY ANGIOGRAPHY N/A 06/02/2019  ? Procedure: LEFT HEART CATH AND CORONARY ANGIOGRAPHY;  Surgeon: Jettie Booze, MD;  Location: [facility];  Service: Cardiovascular;  Laterality: N/A;  ? TONSILLECTOMY    ? TOTAL HIP ARTHROPLASTY Right 2010  ? wisdom teeth extracted    ? ? ?There were no vitals filed for this visit. ? ? Subjective Assessment - 11/24/21 1459   ? ? Subjective Things going well.  Been doing the exercises.  Still a little unsure of L knee because of the pain at times.   ? Patient Stated Goals Pt's goals for therapy are to improve stairs, balance, walking.   ? Currently in Pain? No/denies   ? ?  ?  ? ?  ? ? ? ? ? ? ? ? ? ? ? ?Exercises-Reviewed previous HEP and pt return demo understanding. ?- Sit to Stand with Arms Crossed  - 1 x daily - 5 x weekly - 2 sets - 10 reps ?- Standing Toe Taps  - 1 x daily - 5 x weekly -  2 sets - 20 reps ?- Heel Raises with Counter Support  - 1 x daily - 5 x weekly - 2 sets - 10 reps ?-runner's Stretch at counter, 3x, 30 sec-pt with difficulty feeling gastroc stretch and increases L knee discomfort, do d/c this stretch and added gastroc stretch on step  ? ? ? ? ? ? ? ? ? [facility] Adult PT Treatment/Exercise - 11/24/21 0001   ? ?  ? Knee/Hip Exercises: Stretches  ? Active Hamstring Stretch Right;Left;3 reps;30 seconds   ? Gastroc Stretch Right;Left;3 reps;30 seconds   ? Gastroc Stretch Limitations Foot propped on cabinet shelf (4")-pt prefers this compared to runner's stretch   ?  ? Knee/Hip Exercises: Aerobic  ? Nustep L5 x 8 min, 4 extremities, keeping speed > 90 steps/min for aerobic warm-up   ?  ? Knee/Hip Exercises: Standing  ? Forward Step Up Both;10 reps;Hand Hold: 1;2 sets;Step Height: 6"   ? Forward Step Up Limitations cues for slowed pace, improved eccentric control on step back.  First set:  step up/up, down/down x 10 reps each leg leading, then  second set single step up x 10 reps light UE support   ? Other Standing Knee Exercises --   ? ?  ?  ? ?  ? ? ? ? ? ? ? ? ? ? PT Education - 11/24/21 1559   ? ? Education Details Review of HEP and addition/modification for stretches   ? Person(s) Educated Patient   ? Methods Explanation;Demonstration;Handout   ? Comprehension Verbalized understanding;Returned demonstration   ? ?  ?  ? ?  ? ? ? PT Short Term Goals - 11/14/21 1715   ? ?  ? PT SHORT TERM GOAL #1  ? Title Pt will be independent with HEP for improved strength, balance, transfers, and gait.  TARGET 12/09/2021   ? Time 4   ? Period Weeks   ? Status On-going   ?  ? PT SHORT TERM GOAL #2  ? Title Pt will improve single limb stance to 3 sec bilaterally for improved strength to assist with stair and obstacle negotiation.   ? Time 4   ? Period Weeks   ? Status On-going   ? ?  ?  ? ?  ? ? ? ? PT Long Term Goals - 11/14/21 1716   ? ?  ? PT LONG TERM GOAL #1  ? Title Pt will be independent with progression of/final HEP for improved functional mobility, strength, balance.  TARGET 12/23/2021   ? Time 6   ? Period Weeks   ? Status On-going   ?  ? PT LONG TERM GOAL #2  ? Title Pt will improve 5x sit<>stand to less than or equal to 11.5 seconds for improved functional strength.   ? Baseline 12.63 sec   ? Time 6   ? Period Weeks   ? Status On-going   ?  ? PT LONG TERM GOAL #3  ? Title Pt will improve MiniBESTest score to at least 23/28 sec for decreased fall risk.   ? Baseline 21/28   ? Time 6   ? Period Weeks   ? Status On-going   ?  ? PT LONG TERM GOAL #4  ? Title Pt will report at least 50% improvement in stair negotiation with handrail, mod I for improved stair negotiation to second level of home.   ? Time 6   ? Period Weeks   ? Status On-going   ? ?  ?  ? ?  ? ? ? ? ? ? ? ?  Plan - 11/24/21 1559   ? ? Clinical Impression Statement Skilled PT session today focused on review of pt's current HEP and continued work on single limb stance.  Modified standing gastroc stretch  from runner's stretch to foot prop on 4" step, as runner's stretch increases L knee discomfort.  She continues to work hard to lessen/lighten UE support with single limb stance activities, but continues to be more stable with light UE support.   ? Personal Factors and Comorbidities Comorbidity 3+   ? Comorbidities See problem list   ? Examination-Activity Limitations Locomotion Level;Transfers;Stairs;Stand   ? Examination-Participation Restrictions Community Activity;Other   Fitness activities  ? Stability/Clinical Decision Making Stable/Uncomplicated   ? Rehab Potential Good   ? PT Frequency 2x / week   ? PT Duration 6 weeks   plus eval  ? PT Treatment/Interventions ADLs/Self Care Home Management;Gait training;Stair training;Functional mobility training;Therapeutic activities;Therapeutic exercise;Balance training;Neuromuscular re-education;Patient/family education;DME Instruction   ? PT Next Visit Plan Add to HEP as needed; work on SLS, ankle strength, RLE functional strength, stair negotiation; dynamic balance, changes of direction, obstacles   ? PT Home Exercise Plan quad set for anterior right knee pain   ? Consulted and Agree with Plan of Care Patient   ? ?  ?  ? ?  ? ? ?Patient will benefit from skilled therapeutic intervention in order to improve the following deficits and impairments:  Abnormal gait, Difficulty walking, Decreased balance, Decreased mobility, Decreased strength, Postural dysfunction, Decreased coordination ? ?Visit Diagnosis: ?Muscle weakness (generalized) ? ?Unsteadiness on feet ? ? ? ? ?Problem List ?Patient Active Problem List  ? Diagnosis Date Noted  ? Vitamin B 12 deficiency 05/30/2021  ? Dry mouth 02/24/2021  ? Anemia 03/16/2020  ? Coronary artery disease involving native coronary artery of native heart without angina pectoris 06/12/2019  ? Abnormal stress test   ? Diastolic dysfunction 35/46/5681  ? Cough 05/10/2018  ? Rheumatoid arthritis ([place]) 01/08/2018  ? Hand pain, right  10/15/2017  ? Vitamin D deficiency 12/20/2016  ? Prediabetes 12/20/2016  ? Shortness of breath on exertion 11/27/2016  ? Preventative health care 12/19/2015  ? Constipation 12/07/2015  ? BCC (basal cell carcino

## 2021-11-24 NOTE — Patient Instructions (Addendum)
?  Access Code: [email or web address] ?URL: [email or web address] ?Date: 11/24/2021 ?Prepared by: [facility] ? ?Program Notes ?perform with hand hovering over handrail/counter ? ?Exercises ?- Sit to Stand with Arms Crossed  - 1 x daily - 5 x weekly - 2 sets - 10 reps ?- Standing Toe Taps  - 1 x daily - 5 x weekly - 2 sets - 20 reps ?- Heel Raises with Counter Support  - 1 x daily - 5 x weekly - 2 sets - 10 reps ? ?Added 11/24/2021 ?- Standing Gastroc Stretch on Step  - 1-2 x daily - 7 x weekly - 1 sets - 3 reps - 15-30 sec hold ?- Seated Hamstring Stretch  - 1-2 x daily - 7 x weekly - 1 sets - 3 reps - 30 sec hold ? ?

## 2021-11-25 ENCOUNTER — Other Ambulatory Visit: Payer: Self-pay | Admitting: Nurse Practitioner

## 2021-11-25 ENCOUNTER — Other Ambulatory Visit: Payer: Self-pay | Admitting: Neurology

## 2021-11-25 ENCOUNTER — Other Ambulatory Visit: Payer: Self-pay | Admitting: Family Medicine

## 2021-11-25 ENCOUNTER — Telehealth: Payer: Self-pay | Admitting: Neurology

## 2021-11-25 DIAGNOSIS — G20A1 Parkinson's disease without dyskinesia, without mention of fluctuations: Secondary | ICD-10-CM

## 2021-11-25 DIAGNOSIS — G20 Parkinson's disease: Secondary | ICD-10-CM

## 2021-11-25 MED ORDER — CARBIDOPA-LEVODOPA 25-100 MG PO TABS: ORAL_TABLET | ORAL | 0 refills | Status: DC

## 2021-11-28 DIAGNOSIS — E559 Vitamin D deficiency, unspecified: Secondary | ICD-10-CM | POA: Diagnosis not present

## 2021-11-28 DIAGNOSIS — M06041 Rheumatoid arthritis without rheumatoid factor, right hand: Secondary | ICD-10-CM | POA: Diagnosis not present

## 2021-11-28 DIAGNOSIS — M06042 Rheumatoid arthritis without rheumatoid factor, left hand: Secondary | ICD-10-CM | POA: Diagnosis not present

## 2021-11-28 DIAGNOSIS — Z79899 Other long term (current) drug therapy: Secondary | ICD-10-CM | POA: Diagnosis not present

## 2021-12-05 ENCOUNTER — Encounter: Payer: Self-pay | Admitting: Family Medicine

## 2021-12-05 ENCOUNTER — Encounter: Payer: Self-pay | Admitting: Neurology

## 2021-12-05 NOTE — Telephone Encounter (Signed)
I agree ok to schedule as long as ok with Dr. Charlett Blake ?

## 2021-12-05 NOTE — Telephone Encounter (Addendum)
Patient is requesting to transfer from [place] to [place] due to her husband and son both being current patients of Dr. Yong Channel.   Please advise.

## 2021-12-06 ENCOUNTER — Encounter: Payer: Self-pay | Admitting: Physical Therapy

## 2021-12-06 ENCOUNTER — Ambulatory Visit: Payer: Medicare Other | Attending: Neurology | Admitting: Physical Therapy

## 2021-12-06 DIAGNOSIS — R2689 Other abnormalities of gait and mobility: Secondary | ICD-10-CM | POA: Insufficient documentation

## 2021-12-06 DIAGNOSIS — M6281 Muscle weakness (generalized): Secondary | ICD-10-CM | POA: Diagnosis not present

## 2021-12-06 DIAGNOSIS — R2681 Unsteadiness on feet: Secondary | ICD-10-CM | POA: Insufficient documentation

## 2021-12-06 NOTE — Therapy (Signed)
[facility] ?[address], [address] ?[place], [place], [address] ?Phone: [phone]   Fax:  [phone] ? ?Physical Therapy Treatment ? ?Patient Details  ?Name: Kristin [name] ?MRN: [id number] ?Date of Birth: 02-Nov-1950 ?Referring Provider (PT): Tat, [name] ? ? ?Encounter Date: 12/06/2021 ? ? PT End of Session - 12/06/21 1548   ? ? Visit Number 5   ? Number of Visits 13   ? Date for PT Re-Evaluation 12/23/21   ? Authorization Type Medicare/[employer]   ? PT Start Time 1326   ? PT Stop Time 7494   ? PT Time Calculation (min) 38 min   ? Activity Tolerance Patient tolerated treatment well   ? Behavior During Therapy [facility] for tasks assessed/performed   Fearful at times with balance activities in MiniBESTest  ? ?  ?  ? ?  ? ? ?Past Medical History:  ?Diagnosis Date  ? Abnormal vaginal Pap smear   ? Acute pharyngitis 02/25/2013  ? Anemia   ? Anxiety   ? Arthritis   ? BCC (basal cell carcinoma of skin) 10/05/2014  ? On back  ? Chicken pox as a child  ? Chronic UTI   ? sees dr Terance Hart  ? Constipation 12/07/2015  ? Depression with anxiety 11/02/2009  ? Qualifier: Diagnosis of  By: [name] LPN, [name]   ? Dermatitis 07/17/2012  ? Esophageal stricture 1994  ? Fibroids   ? Foot pain, bilateral 06/19/2012  ? GERD (gastroesophageal reflux disease)   ? GERD (gastroesophageal reflux disease)   ? Hiatal hernia   ? Hyperglycemia 08/19/2013  ? 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 Hyperlipidemia   ? Hypertension   ? Infertility, female   ? Low back pain 10/17/2007  ? Qualifier: Diagnosis of  By: [name], [name]   ? Measles as a child  ? Obesity   ? Osteoarthritis   ? Parkinson disease ([place])   ? Plantar fasciitis of left foot 06/19/2012  ? Preventative health care 12/19/2015  ? Rheumatoid arthritis ([place]) 01/08/2018  ? Rosacea 10/05/2014  ? Swallowing difficulty   ? Urinary frequency 02/25/2013  ? Visual floaters 05/11/2014  ? ? ?Past Surgical History:  ?Procedure Laterality Date  ? ABDOMINAL HYSTERECTOMY   2006  ? total  ? esophageal    ? stretching  ? HERNIA REPAIR N/A   ? laporoscopy    ? LEFT HEART CATH AND CORONARY ANGIOGRAPHY N/A 06/02/2019  ? Procedure: LEFT HEART CATH AND CORONARY ANGIOGRAPHY;  Surgeon: Jettie Booze, MD;  Location: [facility];  Service: Cardiovascular;  Laterality: N/A;  ? TONSILLECTOMY    ? TOTAL HIP ARTHROPLASTY Right 2010  ? wisdom teeth extracted    ? ? ?There were no vitals filed for this visit. ? ? Subjective Assessment - 12/06/21 1327   ? ? Subjective No changes.  Occasional pain in posterior thigh/back, but not too bad.  Notice that I can walk more without wobbling as much.   ? Patient Stated Goals Pt's goals for therapy are to improve stairs, balance, walking.   ? Currently in Pain? No/denies   ? ?  ?  ? ?  ? ? ? ? ? ? ? ? ? ? ? ? ? ? ? ? ? ? ? ? [place] Adult PT Treatment/Exercise - 12/06/21 0001   ? ?  ? Ambulation/Gait  ? Ambulation/Gait Yes   ? Ambulation/Gait Assistance 6: Modified independent (Device/Increase time)   ? Ambulation Distance (Feet)  480 Feet   ? Assistive device None   ? Gait Pattern Step-through pattern;Decreased arm swing - right;Decreased step length - right;Decreased weight shift to right   ? Ambulation Surface Level;Indoor   ? Gait Comments Cues for relaxed R UE for improved relaxed arm swing>which improves increased step length; pt reports more natural gait pattern.   ?  ? High Level Balance  ? High Level Balance Comments SLS LLE:  4.75, 2.22, 4.75; RLE 1.10, 1.91, 2.1 sec   ?  ? Knee/Hip Exercises: Standing  ? Lateral Step Up Right;Left;Hand Hold: 1;Hand Hold: 2   ? Lateral Step Up Limitations increased UE support with LLE step up, no knee pain.   ? Forward Step Up Both;10 reps;Hand Hold: 1;2 sets;Step Height: 6"   ? Forward Step Up Limitations cues for slowed pace, improved eccentric control on step back.  First set:  step up/up, down/down x 10 reps each leg leading, then second set single step up x 10 reps light UE support.  C/o knee pain with  LLE leading   ? Other Standing Knee Exercises Resisted sidestepping along counter, 5 reps, red theraband   ? Other Standing Knee Exercises Monster walk, resisted forward/back along counter, 3 reps   ? ?  ?  ? ?  ? ? ? ? ? ? Balance Exercises - 12/06/21 0001   ? ?  ? Balance Exercises: Standing  ? Step Over Hurdles / Cones Sidestep over low hurdles, alternating legs, 10 reps, then x 1 minute with naming birds. Forward step over low hurdles, return to midline x 1 minute, with additional naming tasks of flowers.  Used mirror for visual cues to avoid looking down at feet   ? Other Standing Exercises Forward>back step and weigthshift (forward over low hurdle) x 10 reps   ? ?  ?  ? ?  ? ? ? ? ? PT Education - 12/06/21 1548   ? ? Education Details Addition to HEP   ? Person(s) Educated Patient   ? Methods Explanation;Demonstration;Handout   ? Comprehension Verbalized understanding;Returned demonstration   ? ?  ?  ? ?  ? ? ? PT Short Term Goals - 12/06/21 1339   ? ?  ? PT SHORT TERM GOAL #1  ? Title Pt will be independent with HEP for improved strength, balance, transfers, and gait.  TARGET 12/09/2021   ? Time 4   ? Period Weeks   ? Status Achieved   ?  ? PT SHORT TERM GOAL #2  ? Title Pt will improve single limb stance to 3 sec bilaterally for improved strength to assist with stair and obstacle negotiation.   ? Time 4   ? Period Weeks   ? Status Partially Met   ? ?  ?  ? ?  ? ? ? ? PT Long Term Goals - 11/14/21 1716   ? ?  ? PT LONG TERM GOAL #1  ? Title Pt will be independent with progression of/final HEP for improved functional mobility, strength, balance.  TARGET 12/23/2021   ? Time 6   ? Period Weeks   ? Status On-going   ?  ? PT LONG TERM GOAL #2  ? Title Pt will improve 5x sit<>stand to less than or equal to 11.5 seconds for improved functional strength.   ? Baseline 12.63 sec   ? Time 6   ? Period Weeks   ? Status On-going   ?  ? PT LONG TERM GOAL #3  ?  Title Pt will improve MiniBESTest score to at least 23/28 sec for  decreased fall risk.   ? Baseline 21/28   ? Time 6   ? Period Weeks   ? Status On-going   ?  ? PT LONG TERM GOAL #4  ? Title Pt will report at least 50% improvement in stair negotiation with handrail, mod I for improved stair negotiation to second level of home.   ? Time 6   ? Period Weeks   ? Status On-going   ? ?  ?  ? ?  ? ? ? ? ? ? ? ? Plan - 12/06/21 1549   ? ? Clinical Impression Statement Assessed STGs this visit, with pt meeting STG 1 and partially meeting STG 2.  Further addressed hip strenghtening and balance in session today, wtih pt needing increased UE support with LLE single limb activities, due to occasional L knee pain.  She continues to work hard in sessions and will continue to benefit from skilled PT towards LTGs.   ? Personal Factors and Comorbidities Comorbidity 3+   ? Comorbidities See problem list   ? Examination-Activity Limitations Locomotion Level;Transfers;Stairs;Stand   ? Examination-Participation Restrictions Community Activity;Other   Fitness activities  ? Stability/Clinical Decision Making Stable/Uncomplicated   ? Rehab Potential Good   ? PT Frequency 2x / week   ? PT Duration 6 weeks   plus eval  ? PT Treatment/Interventions ADLs/Self Care Home Management;Gait training;Stair training;Functional mobility training;Therapeutic activities;Therapeutic exercise;Balance training;Neuromuscular re-education;Patient/family education;DME Instruction   ? PT Next Visit Plan Review additions to HEP; work on SLS, ankle strength, RLE functional strength, stair negotiation; dynamic balance, changes of direction, obstacles towards LTGs   ? PT Home Exercise Plan quad set for anterior right knee pain   ? Consulted and Agree with Plan of Care Patient   ? ?  ?  ? ?  ? ? ?Patient will benefit from skilled therapeutic intervention in order to improve the following deficits and impairments:  Abnormal gait, Difficulty walking, Decreased balance, Decreased mobility, Decreased strength, Postural dysfunction,  Decreased coordination ? ?Visit Diagnosis: ?Muscle weakness (generalized) ? ?Unsteadiness on feet ? ?Other abnormalities of gait and mobility ? ? ? ? ?Problem List ?Patient Active Problem List  ? Diagnos

## 2021-12-06 NOTE — Patient Instructions (Signed)
Access Code: Eddie Dibbles ?URL: [email or web address] ?Date: 12/06/2021 ?Prepared by: [facility] ? ?Program Notes ?perform with hand hovering over handrail/counter ? ?Exercises ?- Sit to Stand with Arms Crossed  - 1 x daily - 5 x weekly - 2 sets - 10 reps ?- Standing Toe Taps  - 1 x daily - 5 x weekly - 2 sets - 20 reps ?- Heel Raises with Counter Support  - 1 x daily - 5 x weekly - 2 sets - 10 reps ?- Standing Gastroc Stretch on Step  - 1-2 x daily - 7 x weekly - 1 sets - 3 reps - 15-30 sec hold ?- Seated Hamstring Stretch  - 1-2 x daily - 7 x weekly - 1 sets - 3 reps - 30 sec hold ? ?Added 12/06/2021 ?- Side Stepping with Resistance at Ankles and Counter Support  - 1 x daily - 5 x weekly - 1 sets - 5 reps ?

## 2021-12-07 ENCOUNTER — Encounter (HOSPITAL_BASED_OUTPATIENT_CLINIC_OR_DEPARTMENT_OTHER): Payer: Self-pay | Admitting: Cardiovascular Disease

## 2021-12-07 ENCOUNTER — Ambulatory Visit (INDEPENDENT_AMBULATORY_CARE_PROVIDER_SITE_OTHER): Payer: Medicare Other | Admitting: Cardiovascular Disease

## 2021-12-07 DIAGNOSIS — I10 Essential (primary) hypertension: Secondary | ICD-10-CM

## 2021-12-07 DIAGNOSIS — I2510 Atherosclerotic heart disease of native coronary artery without angina pectoris: Secondary | ICD-10-CM

## 2021-12-07 NOTE — Progress Notes (Signed)
?Cardiology Office Note:   ? ?Date:  12/08/2021  ? ?ID:  Kristin [name], DOB 1950/12/21, MRN [id number] ? ?PCP:  Mosie Lukes, MD ?  ?[facility] Providers ?Cardiologist:  Berniece Salines, DO    ? ?Referring MD: Mosie Lukes, MD  ? ?No chief complaint on file. ? ? ?History of Present Illness:   ? ?Kristin [name] is a 71 y.o. female with a hx of hypertension, hyperglycemia, CAD, obesity and hyperlipidemia. She is here for evaluation of CAD at the request of Dr Charlett Blake. She saw her PCP 09/2021  and reported atypical chest pain. 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 Kristin [name] She was referred to cardiology for further evaluation. She had a negative nuclear stress test 07/2021. She had left heart cath 05/2019 which had 10% RCA lesion ? ?Today, she is doing well. ?She reports she often gets chest pain sometimes and when she takes deep breaths, she is able to calm herself down. She is worried about it. She adds that before she removed her hiatal hernia, she was experiencing this discomfort and went to a cardiologist. She reports the chest pain occurs at random times and is not dependent on what she is doing at that moment. ?She is staying active and exercising with PT, swimming, spin class and lifting weights. Does not feel any chest pain or shortness of breath when exercising. Her heart rate is normally at 80's when she is exercising. She checks her blood pressure and systolic is between 500-370 and diastolic is between 48-88.  ? ?She denies shortness of breath, palpitations, lightheadedness, headaches, syncope, LE edema, orthopnea, PND.  ? ?Past Medical History:  ?Diagnosis Date  ? Abnormal vaginal Pap smear   ? Acute pharyngitis 02/25/2013  ? Anemia   ? Anxiety   ? Arthritis   ? BCC (basal cell carcinoma of skin) 10/05/2014  ? On back  ? Chicken pox as a child  ? Chronic UTI   ? sees dr Terance Hart  ? Constipation 12/07/2015  ? Depression with anxiety 11/02/2009  ? Qualifier: Diagnosis of  By: [name] LPN, [name]   ? Dermatitis  07/17/2012  ? Esophageal stricture 1994  ? Fibroids   ? Foot pain, bilateral 06/19/2012  ? GERD (gastroesophageal reflux disease)   ? GERD (gastroesophageal reflux disease)   ? Hiatal hernia   ? Hyperglycemia 08/19/2013  ? 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 Hyperlipidemia   ? Hypertension   ? Infertility, female   ? Low back pain 10/17/2007  ? Qualifier: Diagnosis of  By: [name], [name]   ? Measles as a child  ? Obesity   ? Osteoarthritis   ? Parkinson disease ([place])   ? Plantar fasciitis of left foot 06/19/2012  ? Preventative health care 12/19/2015  ? Rheumatoid arthritis ([place]) 01/08/2018  ? Rosacea 10/05/2014  ? Swallowing difficulty   ? Urinary frequency 02/25/2013  ? Visual floaters 05/11/2014  ? ? ?Past Surgical History:  ?Procedure Laterality Date  ? ABDOMINAL HYSTERECTOMY  2006  ? total  ? esophageal    ? stretching  ? HERNIA REPAIR N/A   ? laporoscopy    ? LEFT HEART CATH AND CORONARY ANGIOGRAPHY N/A 06/02/2019  ? Procedure: LEFT HEART CATH AND CORONARY ANGIOGRAPHY;  Surgeon: Jettie Booze, MD;  Location: [facility];  Service: Cardiovascular;  Laterality: N/A;  ? TONSILLECTOMY    ? TOTAL HIP ARTHROPLASTY Right 2010  ? wisdom teeth extracted    ? ? ?Current Medications: ?  Current Meds  ?Medication Sig  ? carbidopa-levodopa (SINEMET CR) 50-200 MG tablet TAKE 1 TABLET BY MOUTH EVERYDAY AT BEDTIME  ? carbidopa-levodopa (SINEMET IR) 25-100 MG tablet TAKE 2 TABLETS BY MOUTH AT 7AM, 2TABS AT 11AM, 1TAB AT 4PM  ? carvedilol (COREG) 3.125 MG tablet TAKE 1 TABLET BY MOUTH 2 TIMES DAILY WITH A MEAL.  ? DULoxetine (CYMBALTA) 30 MG capsule TAKE 1 CAPSULE BY MOUTH EVERY DAY  ? escitalopram (LEXAPRO) 20 MG tablet TAKE 1 TABLET BY MOUTH EVERY DAY  ? folic acid (FOLVITE) 1 MG tablet Take 1 mg by mouth 2 (two) times daily.  ? losartan (COZAAR) 50 MG tablet TAKE 1 TABLET BY MOUTH EVERY DAY  ? methotrexate 250 MG/10ML injection methotrexate sodium 25 mg/mL injection solution ? INJECT 0.8MLS SUBCUTANEOUSLY EVERY 7 DAYS   ? omeprazole (PRILOSEC) 20 MG capsule Take 1 capsule (20 mg total) by mouth 2 (two) times daily as needed.  ? polyethylene glycol (MIRALAX / GLYCOLAX) 17 g packet Take 17 g by mouth every other day.  ? pramipexole (MIRAPEX) 0.5 MG tablet TAKE 1 TABLET BY MOUTH 3 TIMES DAILY.  ? rosuvastatin (CRESTOR) 10 MG tablet TAKE 1 TABLET ON MONDAYS,WEDNESDAYS, AND FRIDAYS  ? solifenacin (VESICARE) 5 MG tablet Take 5 mg by mouth daily.  ?  ? ?Allergies:   Prednisone, Bactrim [sulfamethoxazole-trimethoprim], Pb-hyoscy-atropine-scopolamine [phenobarbital-belladonna alk], Penicillins, and Scopolamine  ? ?Social History  ? ?Socioeconomic History  ? Marital status: Married  ?  Spouse name: Not on file  ? Number of children: 1  ? Years of education: Not on file  ? Highest education level: Master's degree (e.g., MA, MS, MEng, MEd, MSW, MBA)  ?Occupational History  ? Occupation: Retired Teacher, music  ?Tobacco Use  ? Smoking status: Never  ? Smokeless tobacco: Never  ?Vaping Use  ? Vaping Use: Never used  ?Substance and Sexual Activity  ? Alcohol use: Yes  ?  Comment: occasional wine  ? Drug use: No  ? Sexual activity: Not Currently  ?  Partners: Male  ?Other Topics Concern  ? Not on file  ?Social History Narrative  ? Lives with husband, no major dietary restrictions, retired from teaching  ?   ? Husband dx with cancer MDS June 2017.  ? Currently in remission. (11/03/16 pc)  ?   ? Pt has one biological child the other 3 are adopted  ?   ? ?Social Determinants of Health  ? ?Financial Resource Strain: Low Risk   ? Difficulty of Paying Living Expenses: Not hard at all  ?Food Insecurity: No Food Insecurity  ? Worried About Charity fundraiser in the Last Year: Never true  ? Ran Out of Food in the Last Year: Never true  ?Transportation Needs: No Transportation Needs  ? Lack of Transportation (Medical): No  ? Lack of Transportation (Non-Medical): No  ?Physical Activity: Sufficiently Active  ? Days of Exercise per Week: 5 days  ? Minutes of  Exercise per Session: 60 min  ?Stress: No Stress Concern Present  ? Feeling of Stress : Not at all  ?Social Connections: Moderately Integrated  ? Frequency of Communication with Friends and Family: More than three times a week  ? Frequency of Social Gatherings with Friends and Family: More than three times a week  ? Attends Religious Services: Never  ? Active Member of Clubs or Organizations: Yes  ? Attends Archivist Meetings: More than 4 times per year  ? Marital Status: Married  ?  ? ?Family  History: ?The patient's family history includes Arthritis in her father, sister, sister, and sister; Bipolar disorder in her mother; Breast cancer in her maternal uncle; Breast cancer (age of onset: 13) in her maternal aunt; Breast cancer (age of onset: 58) in her mother; Cancer in her mother; Depression in her mother and sister; Heart attack in her paternal grandfather and paternal uncle; Heart disease in her father; Hyperlipidemia in her father and mother; Hypertension in her father; Mental illness in her mother and sister; Other in her mother; Parkinson's disease in her sister; Thyroid disease in her mother. There is no history of Colon cancer, Esophageal cancer, Rectal cancer, or Stomach cancer. ? ?ROS:   ?Please see the history of present illness.   ?(+) chest pain  ? All other systems reviewed and are negative. ? ?EKGs/Labs/Other Studies Reviewed:   ? ?The following studies were reviewed today: ? ?ECHO 05/09/2019 ? ?1. The left ventricle has normal systolic function with an ejection  ?fraction of 60-65%. The cavity size was normal. There is mild concentric  ?left ventricular hypertrophy. Left ventricular diastolic Doppler  ?parameters are consistent with impaired  ?relaxation. No evidence of left ventricular regional wall motion  ?abnormalities.  ? 2. The right ventricle has normal systolic function. The cavity was  ?normal. There is no increase in right ventricular wall thickness.  ? 3. No evidence of mitral  valve stenosis.  ? 4. The aortic valve is tricuspid. Mild sclerosis of the aortic valve. No  ?stenosis of the aortic valve.  ? 5. The aortic root and ascending aorta are normal in size and structure

## 2021-12-07 NOTE — Assessment & Plan Note (Addendum)
Minimal plaque on cath in 2020. She has chest pain that occurs randomly and not with exertion. She exercises regularly and has no symptom.  No additional testing at this time.  Continue rosuvastatin.  LDL goal less than 70.  Continue carvedilol. ?

## 2021-12-07 NOTE — Assessment & Plan Note (Signed)
Blood pressure was mildly elevated and improved on repeat.  Continue losartan and carvedilol.  She was encouraged to keep up her exercise and limit sodium. ?

## 2021-12-07 NOTE — Patient Instructions (Signed)
Medication Instructions:  ?Your physician recommends that you continue on your current medications as directed. Please refer to the Current Medication list given to you today.  ? ?*If you need a refill on your cardiac medications before your next appointment, please call your pharmacy* ? ?Lab Work: ?NONE ? ?Testing/Procedures: ?NONE ? ?Follow-Up: ?At [facility], you and your health needs are our priority.  As part of our continuing mission to provide you with exceptional heart care, we have created designated Provider Care Teams.  These Care Teams include your primary Cardiologist (physician) and Advanced Practice Providers (APPs -  Physician Assistants and Nurse Practitioners) who all work together to provide you with the care you need, when you need it. ? ?We recommend signing up for the patient portal called "MyChart".  Sign up information is provided on this After Visit Summary.  MyChart is used to connect with patients for Virtual Visits (Telemedicine).  Patients are able to view lab/test results, encounter notes, upcoming appointments, etc.  Non-urgent messages can be sent to your provider as well.   ?To learn more about what you can do with MyChart, go to [email or web address]   ? ?Your next appointment:   ?12 month(s) ? ?The format for your next appointment:   ?In Person ? ?Provider:   ?Tiffany [place], MD  ? ? ? ? ? ? ?

## 2021-12-08 ENCOUNTER — Encounter: Payer: Self-pay | Admitting: Physical Therapy

## 2021-12-08 ENCOUNTER — Ambulatory Visit: Payer: Medicare Other | Admitting: Physical Therapy

## 2021-12-08 ENCOUNTER — Encounter (HOSPITAL_BASED_OUTPATIENT_CLINIC_OR_DEPARTMENT_OTHER): Payer: Self-pay | Admitting: Cardiovascular Disease

## 2021-12-08 DIAGNOSIS — M6281 Muscle weakness (generalized): Secondary | ICD-10-CM

## 2021-12-08 DIAGNOSIS — R2681 Unsteadiness on feet: Secondary | ICD-10-CM

## 2021-12-08 DIAGNOSIS — R2689 Other abnormalities of gait and mobility: Secondary | ICD-10-CM

## 2021-12-08 NOTE — Therapy (Signed)
[facility] ?[address], [address] ?[place], [place], [address] ?Phone: [phone]   Fax:  [phone] ? ?Physical Therapy Treatment ? ?Patient Details  ?Name: Kristin [name] ?MRN: [id number] ?Date of Birth: 1951/03/07 ?Referring Provider (PT): Tat, [name] ? ? ?Encounter Date: 12/08/2021 ? ? PT End of Session - 12/08/21 1534   ? ? Visit Number 6   ? Number of Visits 13   ? Date for PT Re-Evaluation 12/23/21   ? Authorization Type Medicare/[employer]   ? PT Start Time 9379   ? PT Stop Time 1528   ? PT Time Calculation (min) 42 min   ? Activity Tolerance Patient tolerated treatment well   ? Behavior During Therapy [facility] for tasks assessed/performed   Fearful at times with balance activities in MiniBESTest  ? ?  ?  ? ?  ? ? ?Past Medical History:  ?Diagnosis Date  ? Abnormal vaginal Pap smear   ? Acute pharyngitis 02/25/2013  ? Anemia   ? Anxiety   ? Arthritis   ? BCC (basal cell carcinoma of skin) 10/05/2014  ? On back  ? Chicken pox as a child  ? Chronic UTI   ? sees dr Terance Hart  ? Constipation 12/07/2015  ? Depression with anxiety 11/02/2009  ? Qualifier: Diagnosis of  By: [name] LPN, [name]   ? Dermatitis 07/17/2012  ? Esophageal stricture 1994  ? Fibroids   ? Foot pain, bilateral 06/19/2012  ? GERD (gastroesophageal reflux disease)   ? GERD (gastroesophageal reflux disease)   ? Hiatal hernia   ? Hyperglycemia 08/19/2013  ? 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 Hyperlipidemia   ? Hypertension   ? Infertility, female   ? Low back pain 10/17/2007  ? Qualifier: Diagnosis of  By: [name], [name]   ? Measles as a child  ? Obesity   ? Osteoarthritis   ? Parkinson disease ([place])   ? Plantar fasciitis of left foot 06/19/2012  ? Preventative health care 12/19/2015  ? Rheumatoid arthritis ([place]) 01/08/2018  ? Rosacea 10/05/2014  ? Swallowing difficulty   ? Urinary frequency 02/25/2013  ? Visual floaters 05/11/2014  ? ? ?Past Surgical History:  ?Procedure Laterality Date  ? ABDOMINAL HYSTERECTOMY   2006  ? total  ? esophageal    ? stretching  ? HERNIA REPAIR N/A   ? laporoscopy    ? LEFT HEART CATH AND CORONARY ANGIOGRAPHY N/A 06/02/2019  ? Procedure: LEFT HEART CATH AND CORONARY ANGIOGRAPHY;  Surgeon: Jettie Booze, MD;  Location: [facility];  Service: Cardiovascular;  Laterality: N/A;  ? TONSILLECTOMY    ? TOTAL HIP ARTHROPLASTY Right 2010  ? wisdom teeth extracted    ? ? ?There were no vitals filed for this visit. ? ? Subjective Assessment - 12/08/21 1449   ? ? Subjective Had a little pain in my inner thigh this week-probably from the exercises.   ? Patient Stated Goals Pt's goals for therapy are to improve stairs, balance, walking.   ? Currently in Pain? No/denies   ? ?  ?  ? ?  ? ? ? ? ? ? ? ? ? ? ? ? ? ? ? ? ? ? ? ? [facility] PT Treatment/Exercise - 12/08/21 0001   ? ?  ? Ambulation/Gait  ? Ambulation/Gait Yes   ? Ambulation/Gait Assistance 6: Modified independent (Device/Increase time)   ? Ambulation Distance (Feet) 240 Feet   360  ? Assistive device None   ?  Gait Pattern Step-through pattern;Decreased arm swing - right;Decreased step length - right;Decreased weight shift to right   ? Ambulation Surface Level;Indoor   ? Gait Comments Pt able to verbalize need for increased R arm swing and able to self-correct with increased step length/arm swing.   ?  ? Knee/Hip Exercises: Aerobic  ? Nustep L5 x 8 min, 4 extremities, keeping speed > 90 steps/min for aerobic warm-up   ?  ? Knee/Hip Exercises: Standing  ? Other Standing Knee Exercises Resisted sidestepping along counter, 5 reps, red theraband   ? Other Standing Knee Exercises Monster walk, resisted forward/back along counter, 3 reps   ? ?  ?  ? ?  ? ? ? ? ? ? Balance Exercises - 12/08/21 0001   ? ?  ? Balance Exercises: Standing  ? Tandem Gait Forward;Upper extremity support;4 reps;Limitations   wearing 2# weight  ? Tandem Gait Limitations Progress to tandem march 4 reps along counter, wearing 2# ankle weight   ? Marching Upper extremity  assist 1;Solid surface;Forwards;Limitations;Retro   ? Marching Limitations 3 times, 2#   ? Other Standing Exercises Diagonal side step forrward/back with blackbeam/balance beam in middle:  step-together R and and L, then step R/tap to middle, step L/tap to middle, then single step R then L, for improved dynamic single limb stance and balance.  Min guard provided throughout.   ? Other Standing Exercises Comments Four square step activity clockwise>counterclockwise, 4 reps with occasional decreased foot clearance of trailing foot.   ? ?  ?  ? ?  ? ? ? ? ? ? ? PT Short Term Goals - 12/06/21 1339   ? ?  ? PT SHORT TERM GOAL #1  ? Title Pt will be independent with HEP for improved strength, balance, transfers, and gait.  TARGET 12/09/2021   ? Time 4   ? Period Weeks   ? Status Achieved   ?  ? PT SHORT TERM GOAL #2  ? Title Pt will improve single limb stance to 3 sec bilaterally for improved strength to assist with stair and obstacle negotiation.   ? Time 4   ? Period Weeks   ? Status Partially Met   ? ?  ?  ? ?  ? ? ? ? PT Long Term Goals - 11/14/21 1716   ? ?  ? PT LONG TERM GOAL #1  ? Title Pt will be independent with progression of/final HEP for improved functional mobility, strength, balance.  TARGET 12/23/2021   ? Time 6   ? Period Weeks   ? Status On-going   ?  ? PT LONG TERM GOAL #2  ? Title Pt will improve 5x sit<>stand to less than or equal to 11.5 seconds for improved functional strength.   ? Baseline 12.63 sec   ? Time 6   ? Period Weeks   ? Status On-going   ?  ? PT LONG TERM GOAL #3  ? Title Pt will improve MiniBESTest score to at least 23/28 sec for decreased fall risk.   ? Baseline 21/28   ? Time 6   ? Period Weeks   ? Status On-going   ?  ? PT LONG TERM GOAL #4  ? Title Pt will report at least 50% improvement in stair negotiation with handrail, mod I for improved stair negotiation to second level of home.   ? Time 6   ? Period Weeks   ? Status On-going   ? ?  ?  ? ?  ? ? ? ? ? ? ? ?  Plan - 12/08/21 1534    ? ? Clinical Impression Statement Skilled PT session today focused on functional strength, hip stability and balance, with pt having occasional difficulty with dynamic single limb stance on RLE.  Reviewed resisted sidestepping added to HEP last visit and made cues/corrections to make sure to avoid compensation with hip external rotation.   ? Personal Factors and Comorbidities Comorbidity 3+   ? Comorbidities See problem list   ? Examination-Activity Limitations Locomotion Level;Transfers;Stairs;Stand   ? Examination-Participation Restrictions Community Activity;Other   Fitness activities  ? Stability/Clinical Decision Making Stable/Uncomplicated   ? Rehab Potential Good   ? PT Frequency 2x / week   ? PT Duration 6 weeks   plus eval  ? PT Treatment/Interventions ADLs/Self Care Home Management;Gait training;Stair training;Functional mobility training;Therapeutic activities;Therapeutic exercise;Balance training;Neuromuscular re-education;Patient/family education;DME Instruction   ? PT Next Visit Plan Continue to work on SLS, ankle strength (attention to full R foot placement on ground to lessen inversion/supination), RLE functional strength, stair negotiation; dynamic balance, changes of direction, obstacle negotiation towards LTGs   ? PT Home Exercise Plan quad set for anterior right knee pain   ? Consulted and Agree with Plan of Care Patient   ? ?  ?  ? ?  ? ? ?Patient will benefit from skilled therapeutic intervention in order to improve the following deficits and impairments:  Abnormal gait, Difficulty walking, Decreased balance, Decreased mobility, Decreased strength, Postural dysfunction, Decreased coordination ? ?Visit Diagnosis: ?Unsteadiness on feet ? ?Muscle weakness (generalized) ? ?Other abnormalities of gait and mobility ? ? ? ? ?Problem List ?Patient Active Problem List  ? Diagnosis Date Noted  ? Vitamin B 12 deficiency 05/30/2021  ? Dry mouth 02/24/2021  ? Anemia 03/16/2020  ? Coronary artery disease  involving native coronary artery of native heart without angina pectoris 06/12/2019  ? Abnormal stress test   ? Diastolic dysfunction 52/84/1324  ? Cough 05/10/2018  ? Rheumatoid arthritis ([place]) 01/08/2018

## 2021-12-13 ENCOUNTER — Ambulatory Visit: Payer: Medicare Other | Admitting: Physical Therapy

## 2021-12-13 ENCOUNTER — Encounter: Payer: Self-pay | Admitting: Physical Therapy

## 2021-12-13 DIAGNOSIS — R2689 Other abnormalities of gait and mobility: Secondary | ICD-10-CM

## 2021-12-13 DIAGNOSIS — R2681 Unsteadiness on feet: Secondary | ICD-10-CM | POA: Diagnosis not present

## 2021-12-13 DIAGNOSIS — M6281 Muscle weakness (generalized): Secondary | ICD-10-CM

## 2021-12-13 NOTE — Therapy (Signed)
[facility] ?[address], [address] ?[place], [place], [address] ?Phone: [phone]   Fax:  [phone] ? ?Physical Therapy Treatment ? ?Patient Details  ?Name: Kristin [name] ?MRN: [id number] ?Date of Birth: [birth year] ?Referring Provider (PT): Tat, [name] ? ? ?Encounter Date: 12/13/2021 ? ? PT End of Session - 12/13/21 1406   ? ? Visit Number 7   ? Number of Visits 13   ? Date for PT Re-Evaluation 12/23/21   ? Authorization Type Medicare/[employer]   ? PT Start Time 1315   ? PT Stop Time 1402   ? PT Time Calculation (min) 47 min   ? Activity Tolerance Patient tolerated treatment well   ? Behavior During Therapy [facility] for tasks assessed/performed   Fearful at times with balance activities in MiniBESTest  ? ?  ?  ? ?  ? ? ?Past Medical History:  ?Diagnosis Date  ? Abnormal vaginal Pap smear   ? Acute pharyngitis 02/25/2013  ? Anemia   ? Anxiety   ? Arthritis   ? BCC (basal cell carcinoma of skin) 10/05/2014  ? On back  ? Chicken pox as a child  ? Chronic UTI   ? sees dr Terance Hart  ? Constipation 12/07/2015  ? Depression with anxiety 11/02/2009  ? Qualifier: Diagnosis of  By: [name] LPN, [name]   ? Dermatitis 07/17/2012  ? Esophageal stricture 1994  ? Fibroids   ? Foot pain, bilateral 06/19/2012  ? GERD (gastroesophageal reflux disease)   ? GERD (gastroesophageal reflux disease)   ? Hiatal hernia   ? Hyperglycemia 08/19/2013  ? 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 Hyperlipidemia   ? Hypertension   ? Infertility, female   ? Low back pain 10/17/2007  ? Qualifier: Diagnosis of  By: [name], [name]   ? Measles as a child  ? Obesity   ? Osteoarthritis   ? Parkinson disease ([place])   ? Plantar fasciitis of left foot 06/19/2012  ? Preventative health care 12/19/2015  ? Rheumatoid arthritis ([place]) 01/08/2018  ? Rosacea 10/05/2014  ? Swallowing difficulty   ? Urinary frequency 02/25/2013  ? Visual floaters 05/11/2014  ? ? ?Past Surgical History:  ?Procedure Laterality Date  ? ABDOMINAL HYSTERECTOMY   2006  ? total  ? esophageal    ? stretching  ? HERNIA REPAIR N/A   ? laporoscopy    ? LEFT HEART CATH AND CORONARY ANGIOGRAPHY N/A 06/02/2019  ? Procedure: LEFT HEART CATH AND CORONARY ANGIOGRAPHY;  Surgeon: Jettie Booze, MD;  Location: [facility];  Service: Cardiovascular;  Laterality: N/A;  ? TONSILLECTOMY    ? TOTAL HIP ARTHROPLASTY Right 2010  ? wisdom teeth extracted    ? ? ?There were no vitals filed for this visit. ? ? Subjective Assessment - 12/13/21 1318   ? ? Subjective Is going to an exercise class for Parkinsons. The L medial knee was bothering her the other day- wondering if it was d/t an exercise she did there.   ? Patient Stated Goals Pt's goals for therapy are to improve stairs, balance, walking.   ? Currently in Pain? No/denies   ? ?  ?  ? ?  ? ? ? ? ? ? ? ? ? ? ? ? ? ? ? ? ? ? ? ? [facility] Adult PT Treatment/Exercise - 12/13/21 0001   ? ?  ? Ambulation/Gait  ? Stairs --   clinic stairs x2 with B handrails wiht alt reciprocal  pattern  ?  ? Neuro Re-ed   ? Neuro Re-ed Details  R/L SLS + rolling ball under foot CW/CCW 2x10 each- cues to stand tall through supporting leg   ?  ? Knee/Hip Exercises: Aerobic  ? Nustep L5 x 6 min, 4 extremities, keeping speed > 90 steps/min for aerobic warm-up   ?  ? Knee/Hip Exercises: Standing  ? Heel Raises Both;1 set;10 reps   ? Heel Raises Limitations alt heel raise   ? Step Down Right;Left;Hand Hold: 1;Step Height: 4";2 sets;5 reps;Hand Hold: 2   ? Step Down Limitations controlled step downs + retro step up   ? ?  ?  ? ?  ? ? ? ? ? ? Balance Exercises - 12/13/21 0001   ? ?  ? Balance Exercises: Standing  ? Turning --   figure 8 turns around cones 3x, then additing yellow medball toss 5x- cueing to take more narrow turns  ? Other Standing Exercises Comments lateral stepping over hurdle while weaning from B hand support to none, backards steppig over hurdle weaning with 1 UE support in II bars 10x each and cueing to look down at foot placement occasionally    ? ?  ?  ? ?  ? ? ? ? ? PT Education - 12/13/21 1405   ? ? Education Details discussion on remainder of POC; update to HEP; Encouraged patient to speak to her trainer about modifying exercises.   ? Person(s) Educated Patient   ? Methods Explanation;Demonstration;Tactile cues;Verbal cues;Handout   ? Comprehension Verbalized understanding;Returned demonstration   ? ?  ?  ? ?  ? ? ? PT Short Term Goals - 12/06/21 1339   ? ?  ? PT SHORT TERM GOAL #1  ? Title Pt will be independent with HEP for improved strength, balance, transfers, and gait.  TARGET 12/09/2021   ? Time 4   ? Period Weeks   ? Status Achieved   ?  ? PT SHORT TERM GOAL #2  ? Title Pt will improve single limb stance to 3 sec bilaterally for improved strength to assist with stair and obstacle negotiation.   ? Time 4   ? Period Weeks   ? Status Partially Met   ? ?  ?  ? ?  ? ? ? ? PT Long Term Goals - 11/14/21 1716   ? ?  ? PT LONG TERM GOAL #1  ? Title Pt will be independent with progression of/final HEP for improved functional mobility, strength, balance.  TARGET 12/23/2021   ? Time 6   ? Period Weeks   ? Status On-going   ?  ? PT LONG TERM GOAL #2  ? Title Pt will improve 5x sit<>stand to less than or equal to 11.5 seconds for improved functional strength.   ? Baseline 12.63 sec   ? Time 6   ? Period Weeks   ? Status On-going   ?  ? PT LONG TERM GOAL #3  ? Title Pt will improve MiniBESTest score to at least 23/28 sec for decreased fall risk.   ? Baseline 21/28   ? Time 6   ? Period Weeks   ? Status On-going   ?  ? PT LONG TERM GOAL #4  ? Title Pt will report at least 50% improvement in stair negotiation with handrail, mod I for improved stair negotiation to second level of home.   ? Time 6   ? Period Weeks   ? Status On-going   ? ?  ?  ? ?  ? ? ? ? ? ? ? ?  Plan - 12/13/21 1406   ? ? Clinical Impression Statement Patient arrived to session with report of intermittent L medial knee pain with unknown cause. Discussed exercises that (patient has been performing  with her trainer, agreed that sitting in L hip ER is likely the culprit. Encouraged patient to speak to her trainer about modifying exercises. Progressed heel raises with patient initially demonstrating hesitancy but able to perform after demonstration and updated this into HEP. Able to perform step downs from small step with limited eccentric control but able to wean to 1 UE support. Also worked on weaning UE support with lateral and posterior steps over obstacle; consistent cueing required to look where placing feet for optimal safety and security. Patient was able to demonstrate alternating reciprocal pattern up/down stairs with good stability thus encouraged her to try this technique at home. Patient tolerated session well and without complaints upon leaving.   ? Personal Factors and Comorbidities Comorbidity 3+   ? Comorbidities See problem list   ? Examination-Activity Limitations Locomotion Level;Transfers;Stairs;Stand   ? Examination-Participation Restrictions Community Activity;Other   Fitness activities  ? Stability/Clinical Decision Making Stable/Uncomplicated   ? Rehab Potential Good   ? PT Frequency 2x / week   ? PT Duration 6 weeks   plus eval  ? PT Treatment/Interventions ADLs/Self Care Home Management;Gait training;Stair training;Functional mobility training;Therapeutic activities;Therapeutic exercise;Balance training;Neuromuscular re-education;Patient/family education;DME Instruction   ? PT Next Visit Plan Continue to work on SLS, ankle strength (attention to full R foot placement on ground to lessen inversion/supination), RLE functional strength, stair negotiation; dynamic balance, changes of direction, obstacle negotiation towards LTGs   ? PT Home Exercise Plan quad set for anterior right knee pain   ? Consulted and Agree with Plan of Care Patient   ? ?  ?  ? ?  ? ? ?Patient will benefit from skilled therapeutic intervention in order to improve the following deficits and impairments:  Abnormal  gait, Difficulty walking, Decreased balance, Decreased mobility, Decreased strength, Postural dysfunction, Decreased coordination ? ?Visit Diagnosis: ?Unsteadiness on feet ? ?Muscle weakness (generaliz

## 2021-12-14 ENCOUNTER — Other Ambulatory Visit: Payer: Self-pay | Admitting: Family Medicine

## 2021-12-14 ENCOUNTER — Encounter: Payer: Self-pay | Admitting: Family Medicine

## 2021-12-14 DIAGNOSIS — N6489 Other specified disorders of breast: Secondary | ICD-10-CM

## 2021-12-14 DIAGNOSIS — M816 Localized osteoporosis [Lequesne]: Secondary | ICD-10-CM

## 2021-12-14 DIAGNOSIS — M0609 Rheumatoid arthritis without rheumatoid factor, multiple sites: Secondary | ICD-10-CM

## 2021-12-14 DIAGNOSIS — Z79899 Other long term (current) drug therapy: Secondary | ICD-10-CM

## 2021-12-15 ENCOUNTER — Encounter: Payer: Self-pay | Admitting: Physical Therapy

## 2021-12-15 ENCOUNTER — Ambulatory Visit: Payer: Medicare Other | Admitting: Physical Therapy

## 2021-12-15 DIAGNOSIS — R2689 Other abnormalities of gait and mobility: Secondary | ICD-10-CM

## 2021-12-15 DIAGNOSIS — R2681 Unsteadiness on feet: Secondary | ICD-10-CM | POA: Diagnosis not present

## 2021-12-15 DIAGNOSIS — M6281 Muscle weakness (generalized): Secondary | ICD-10-CM | POA: Diagnosis not present

## 2021-12-15 NOTE — Telephone Encounter (Signed)
Orders are in for Future and pt is scheduled to come in at 8:30 tomorrow morning to get them drawn.  ?

## 2021-12-15 NOTE — Therapy (Signed)
[place] [facility] ?[address], [address] ?[place], [place], [address] ?Phone: [phone]   Fax:  [phone] ? ?Physical Therapy Treatment ? ?Patient Details  ?Name: Kristin [name] ?MRN: [id number] ?Date of Birth: Jan 07, 1951 ?Referring Provider (PT): Tat, [name] ? ? ?Encounter Date: 12/15/2021 ? ? PT End of Session - 12/15/21 1312   ? ? Visit Number 8   ? Number of Visits 13   ? Date for PT Re-Evaluation 12/23/21   ? Authorization Type Medicare/[employer]   ? PT Start Time 1236   ? PT Stop Time 1314   ? PT Time Calculation (min) 38 min   ? Activity Tolerance Patient tolerated treatment well   ? Behavior During Therapy [facility] for tasks assessed/performed   Fearful at times with balance activities in MiniBESTest  ? ?  ?  ? ?  ? ? ?Past Medical History:  ?Diagnosis Date  ? Abnormal vaginal Pap smear   ? Acute pharyngitis 02/25/2013  ? Anemia   ? Anxiety   ? Arthritis   ? BCC (basal cell carcinoma of skin) 10/05/2014  ? On back  ? Chicken pox as a child  ? Chronic UTI   ? sees dr Terance Hart  ? Constipation 12/07/2015  ? Depression with anxiety 11/02/2009  ? Qualifier: Diagnosis of  By: [name] LPN, [name]   ? Dermatitis 07/17/2012  ? Esophageal stricture 1994  ? Fibroids   ? Foot pain, bilateral 06/19/2012  ? GERD (gastroesophageal reflux disease)   ? GERD (gastroesophageal reflux disease)   ? Hiatal hernia   ? Hyperglycemia 08/19/2013  ? 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 Hyperlipidemia   ? Hypertension   ? Infertility, female   ? Low back pain 10/17/2007  ? Qualifier: Diagnosis of  By: [name], [name]   ? Measles as a child  ? Obesity   ? Osteoarthritis   ? Parkinson disease ([place])   ? Plantar fasciitis of left foot 06/19/2012  ? Preventative health care 12/19/2015  ? Rheumatoid arthritis ([place]) 01/08/2018  ? Rosacea 10/05/2014  ? Swallowing difficulty   ? Urinary frequency 02/25/2013  ? Visual floaters 05/11/2014  ? ? ?Past Surgical History:  ?Procedure Laterality Date  ? ABDOMINAL HYSTERECTOMY   2006  ? total  ? esophageal    ? stretching  ? HERNIA REPAIR N/A   ? laporoscopy    ? LEFT HEART CATH AND CORONARY ANGIOGRAPHY N/A 06/02/2019  ? Procedure: LEFT HEART CATH AND CORONARY ANGIOGRAPHY;  Surgeon: Jettie Booze, MD;  Location: [facility];  Service: Cardiovascular;  Laterality: N/A;  ? TONSILLECTOMY    ? TOTAL HIP ARTHROPLASTY Right 2010  ? wisdom teeth extracted    ? ? ?There were no vitals filed for this visit. ? ? Subjective Assessment - 12/15/21 1239   ? ? Subjective No changes from the last visit.  Overscheduled a few things today.  Been walking a little more in the neighborhood.  Feel good when I come to therapy; out in the community, feel that I'm more unsteady.   ? Patient Stated Goals Pt's goals for therapy are to improve stairs, balance, walking.   ? Currently in Pain? No/denies   ? ?  ?  ? ?  ? ? ? ? ? ? ? ? ? ? ? ? ? ? ? ? ? ? ? ? [facility] PT Treatment/Exercise - 12/15/21 0001   ? ?  ? Transfers  ? Transfers Sit to  Stand;Stand to Sit   ? Sit to Stand 6: Modified independent (Device/Increase time);With upper extremity assist;From chair/3-in-1   ? Five time sit to stand comments  11.7   ? Stand to Sit 6: Modified independent (Device/Increase time);Without upper extremity assist;To chair/3-in-1   ?  ? Ambulation/Gait  ? Ambulation/Gait Yes   ? Assistive device None   ? Gait Pattern Step-through pattern;Decreased arm swing - right;Decreased step length - right;Decreased weight shift to right   ? Ambulation Surface Level;Indoor   ? Stairs --   clinic stairs with step through pattern, 4 reps  ? Gait Comments Obstacle negotiation-stepping over obstacles, stepping up and over aerobic step, figure-8 around hula hoops.  Added conversation tasks   ?  ? Knee/Hip Exercises: Aerobic  ? Nustep L5 x 3 min, 4 extremities, then 5 minutes BLEs onlykeeping speed > 90 steps/min for aerobic warm-up   ?  ? Knee/Hip Exercises: Standing  ? Heel Raises Both;1 set;10 reps   ? Heel Raises Limitations alt  heel raise   ? ?  ?  ? ?  ? ? ? ? ? ? Balance Exercises - 12/15/21 0001   ? ?  ? Balance Exercises: Standing  ? SLS with Vectors Solid surface;Limitations   ? SLS with Vectors Limitations alt step taps to 4" step   ? Step Ups Forward;4 inch;6 inch;UE support 2   ? Balance Master: Limits for Stability no pain L knee with 4" step; mild c/o knee pain with 6" step   ? ?  ?  ? ?  ?Forward step downs, from 4" step, from 6" step, with BUE support. ? ?Discussed walking program for home; trying to schedule walking in her routine about 3 times per week to start.   ? ? ? ? PT Short Term Goals - 12/06/21 1339   ? ?  ? PT SHORT TERM GOAL #1  ? Title Pt will be independent with HEP for improved strength, balance, transfers, and gait.  TARGET 12/09/2021   ? Time 4   ? Period Weeks   ? Status Achieved   ?  ? PT SHORT TERM GOAL #2  ? Title Pt will improve single limb stance to 3 sec bilaterally for improved strength to assist with stair and obstacle negotiation.   ? Time 4   ? Period Weeks   ? Status Partially Met   ? ?  ?  ? ?  ? ? ? ? PT Long Term Goals - 11/14/21 1716   ? ?  ? PT LONG TERM GOAL #1  ? Title Pt will be independent with progression of/final HEP for improved functional mobility, strength, balance.  TARGET 12/23/2021   ? Time 6   ? Period Weeks   ? Status On-going   ?  ? PT LONG TERM GOAL #2  ? Title Pt will improve 5x sit<>stand to less than or equal to 11.5 seconds for improved functional strength.   ? Baseline 12.63 sec   ? Time 6   ? Period Weeks   ? Status On-going   ?  ? PT LONG TERM GOAL #3  ? Title Pt will improve MiniBESTest score to at least 23/28 sec for decreased fall risk.   ? Baseline 21/28   ? Time 6   ? Period Weeks   ? Status On-going   ?  ? PT LONG TERM GOAL #4  ? Title Pt will report at least 50% improvement in stair negotiation with handrail, mod I for  improved stair negotiation to second level of home.   ? Time 6   ? Period Weeks   ? Status On-going   ? ?  ?  ? ?  ? ? ? ? ? ? ? ? Plan - 12/15/21 1313    ? ? Clinical Impression Statement Skilled PT toward goals this session-working on functional strength, stair negotiation, and gait with obstacles/conversation tasks.  Pt with improved control with 4" steps, but still decreased eccentric control with 6" step.  With obstacle negoitation, needs cues for increased foot clearance/step length at times.  Pt feels like she is ready for planned d/c next week.   ? Personal Factors and Comorbidities Comorbidity 3+   ? Comorbidities See problem list   ? Examination-Activity Limitations Locomotion Level;Transfers;Stairs;Stand   ? Examination-Participation Restrictions Community Activity;Other   Fitness activities  ? Stability/Clinical Decision Making Stable/Uncomplicated   ? Rehab Potential Good   ? PT Frequency 2x / week   ? PT Duration 6 weeks   plus eval  ? PT Treatment/Interventions ADLs/Self Care Home Management;Gait training;Stair training;Functional mobility training;Therapeutic activities;Therapeutic exercise;Balance training;Neuromuscular re-education;Patient/family education;DME Instruction   ? PT Next Visit Plan Work towards [place] and plan for d/c next visit.   ? PT Home Exercise Plan quad set for anterior right knee pain   ? Consulted and Agree with Plan of Care Patient   ? ?  ?  ? ?  ? ? ?Patient will benefit from skilled therapeutic intervention in order to improve the following deficits and impairments:  Abnormal gait, Difficulty walking, Decreased balance, Decreased mobility, Decreased strength, Postural dysfunction, Decreased coordination ? ?Visit Diagnosis: ?Unsteadiness on feet ? ?Muscle weakness (generalized) ? ?Other abnormalities of gait and mobility ? ? ? ? ?Problem List ?Patient Active Problem List  ? Diagnosis Date Noted  ? Vitamin B 12 deficiency 05/30/2021  ? Dry mouth 02/24/2021  ? Anemia 03/16/2020  ? Coronary artery disease involving native coronary artery of native heart without angina pectoris 06/12/2019  ? Abnormal stress test   ? Diastolic  dysfunction 16/94/5038  ? Cough 05/10/2018  ? Rheumatoid arthritis ([place]) 01/08/2018  ? Hand pain, right 10/15/2017  ? Vitamin D deficiency 12/20/2016  ? Prediabetes 12/20/2016  ? Shortness of breath on

## 2021-12-16 ENCOUNTER — Other Ambulatory Visit (INDEPENDENT_AMBULATORY_CARE_PROVIDER_SITE_OTHER): Payer: Medicare Other

## 2021-12-16 DIAGNOSIS — M816 Localized osteoporosis [Lequesne]: Secondary | ICD-10-CM | POA: Diagnosis not present

## 2021-12-16 DIAGNOSIS — Z79899 Other long term (current) drug therapy: Secondary | ICD-10-CM | POA: Diagnosis not present

## 2021-12-16 DIAGNOSIS — M0609 Rheumatoid arthritis without rheumatoid factor, multiple sites: Secondary | ICD-10-CM

## 2021-12-16 LAB — COMPREHENSIVE METABOLIC PANEL
ALT: 9 U/L (ref 0–35)
AST: 19 U/L (ref 0–37)
Albumin: 4.2 g/dL (ref 3.5–5.2)
Alkaline Phosphatase: 88 U/L (ref 39–117)
BUN: 16 mg/dL (ref 6–23)
CO2: 29 mEq/L (ref 19–32)
Calcium: 9.4 mg/dL (ref 8.4–10.5)
Chloride: 104 mEq/L (ref 96–112)
Creatinine, Ser: 0.68 mg/dL (ref 0.40–1.20)
GFR: 87.51 mL/min (ref >60.00)
Glucose, Bld: 99 mg/dL (ref 70–99)
Potassium: 4.1 mEq/L (ref 3.5–5.1)
Sodium: 140 mEq/L (ref 135–145)
Total Bilirubin: 0.6 mg/dL (ref 0.2–1.2)
Total Protein: 6.2 g/dL (ref 6.0–8.3)

## 2021-12-16 LAB — CBC WITH DIFFERENTIAL/PLATELET
Basophils Absolute: 0.1 10*3/uL (ref 0.0–0.1)
Basophils Relative: 0.9 % (ref 0.0–3.0)
Eosinophils Absolute: 0.1 10*3/uL (ref 0.0–0.7)
Eosinophils Relative: 1.3 % (ref 0.0–5.0)
HCT: 39.3 % (ref 36.0–46.0)
Hemoglobin: 13 g/dL (ref 12.0–15.0)
Lymphocytes Relative: 19.7 % (ref 12.0–46.0)
Lymphs Abs: 1.2 10*3/uL (ref 0.7–4.0)
MCHC: 32.9 g/dL (ref 30.0–36.0)
MCV: 92.4 fl (ref 78.0–100.0)
Monocytes Absolute: 0.7 10*3/uL (ref 0.1–1.0)
Monocytes Relative: 10.5 % (ref 3.0–12.0)
Neutro Abs: 4.2 10*3/uL (ref 1.4–7.7)
Neutrophils Relative %: 67.6 % (ref 43.0–77.0)
Platelets: 202 10*3/uL (ref 150.0–400.0)
RBC: 4.25 Mil/uL (ref 3.87–5.11)
RDW: 14.1 % (ref 11.5–15.5)
WBC: 6.3 10*3/uL (ref 4.0–10.5)

## 2021-12-16 LAB — SEDIMENTATION RATE: Sed Rate: 14 mm/hr (ref 0–30)

## 2021-12-16 LAB — VITAMIN D 25 HYDROXY (VIT D DEFICIENCY, FRACTURES): VITD: 27.97 ng/mL — ABNORMAL LOW (ref 30.00–100.00)

## 2021-12-16 LAB — C-REACTIVE PROTEIN: CRP: <1 mg/dL (ref 0.5–20.0)

## 2021-12-20 ENCOUNTER — Encounter: Payer: Self-pay | Admitting: Physical Therapy

## 2021-12-20 ENCOUNTER — Ambulatory Visit: Payer: Medicare Other | Admitting: Physical Therapy

## 2021-12-20 ENCOUNTER — Telehealth: Payer: Self-pay | Admitting: Family Medicine

## 2021-12-20 DIAGNOSIS — R2689 Other abnormalities of gait and mobility: Secondary | ICD-10-CM | POA: Diagnosis not present

## 2021-12-20 DIAGNOSIS — M6281 Muscle weakness (generalized): Secondary | ICD-10-CM

## 2021-12-20 DIAGNOSIS — R2681 Unsteadiness on feet: Secondary | ICD-10-CM | POA: Diagnosis not present

## 2021-12-20 NOTE — Therapy (Signed)
[place] [facility] ?[address], [address] ?[place], [place], [address] ?Phone: [phone]   Fax:  [phone] ? ?Physical Therapy Treatment ? ?Patient Details  ?Name: Kristin [name] ?MRN: [id number] ?Date of Birth: 03/16/1951 ?Referring Provider (PT): Tat, [name] ? ? ?Encounter Date: 12/20/2021 ? ? PT End of Session - 12/20/21 1358   ? ? Visit Number 9   ? Number of Visits 13   ? Date for PT Re-Evaluation 12/23/21   ? Authorization Type Medicare/[employer]   ? PT Start Time 1318   ? PT Stop Time 0938   ? PT Time Calculation (min) 39 min   ? Activity Tolerance Patient tolerated treatment well   ? Behavior During Therapy [facility] for tasks assessed/performed   Fearful at times with balance activities in MiniBESTest  ? ?  ?  ? ?  ? ? ?Past Medical History:  ?Diagnosis Date  ? Abnormal vaginal Pap smear   ? Acute pharyngitis 02/25/2013  ? Anemia   ? Anxiety   ? Arthritis   ? BCC (basal cell carcinoma of skin) 10/05/2014  ? On back  ? Chicken pox as a child  ? Chronic UTI   ? sees dr Terance Hart  ? Constipation 12/07/2015  ? Depression with anxiety 11/02/2009  ? Qualifier: Diagnosis of  By: [name] LPN, [name]   ? Dermatitis 07/17/2012  ? Esophageal stricture 1994  ? Fibroids   ? Foot pain, bilateral 06/19/2012  ? GERD (gastroesophageal reflux disease)   ? GERD (gastroesophageal reflux disease)   ? Hiatal hernia   ? Hyperglycemia 08/19/2013  ? 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 Hyperlipidemia   ? Hypertension   ? Infertility, female   ? Low back pain 10/17/2007  ? Qualifier: Diagnosis of  By: [name], [name]   ? Measles as a child  ? Obesity   ? Osteoarthritis   ? Parkinson disease ([place])   ? Plantar fasciitis of left foot 06/19/2012  ? Preventative health care 12/19/2015  ? Rheumatoid arthritis ([place]) 01/08/2018  ? Rosacea 10/05/2014  ? Swallowing difficulty   ? Urinary frequency 02/25/2013  ? Visual floaters 05/11/2014  ? ? ?Past Surgical History:  ?Procedure Laterality Date  ? ABDOMINAL HYSTERECTOMY   2006  ? total  ? esophageal    ? stretching  ? HERNIA REPAIR N/A   ? laporoscopy    ? LEFT HEART CATH AND CORONARY ANGIOGRAPHY N/A 06/02/2019  ? Procedure: LEFT HEART CATH AND CORONARY ANGIOGRAPHY;  Surgeon: Jettie Booze, MD;  Location: [facility];  Service: Cardiovascular;  Laterality: N/A;  ? TONSILLECTOMY    ? TOTAL HIP ARTHROPLASTY Right 2010  ? wisdom teeth extracted    ? ? ?There were no vitals filed for this visit. ? ? Subjective Assessment - 12/20/21 1318   ? ? Subjective Went to the upstairs yesterday (have been just not going up the steps, probably since Christmas).  It took a while, but it felt good.  Plan for after therapy:  [place] and swimming on Monday; will plan to go Tuesday, Thursday, Friday at [facility] with my husband.   ? Patient Stated Goals Pt's goals for therapy are to improve stairs, balance, walking.   ? Currently in Pain? No/denies   ? ?  ?  ? ?  ? ? ? ? ? ? ?Access Code: Eddie Dibbles ?URL: [email or web address] ?Date: 12/20/2021 ?Prepared by: [facility] ? ?Program Notes ?perform with  hand hovering over handrail/counter ? ?Exercises-Reviewed full HEP with pt return demo understanding ?- Sit to Stand with Arms Crossed  - 1 x daily - 5 x weekly - 2 sets - 10 reps ?- Standing Toe Taps  - 1 x daily - 5 x weekly - 2 sets - 20 reps ?- Standing Gastroc Stretch on Step  - 1-2 x daily - 7 x weekly - 1 sets - 3 reps - 15-30 sec hold ?- Seated Hamstring Stretch  - 1-2 x daily - 7 x weekly - 1 sets - 3 reps - 30 sec hold ?- Side Stepping with Resistance at Ankles and Counter Support  - 1 x daily - 5 x weekly - 1 sets - 5 reps ?- Alternating Heel Raises  - 1 x daily - 5 x weekly - 2 sets - 10 reps ? ? ? ? ? ? ? ? ? ? ? ? ? [facility] Adult PT Treatment/Exercise - 12/20/21 0001   ? ?  ? Knee/Hip Exercises: Aerobic  ? Nustep L5 x 3 min, 4 extremities, then 5 minutes BLEs onlykeeping speed > 90-100 steps/min for aerobic warm-up.  Able to keep  steps/min with conversation tasks.   ? ?  ?  ? ?  ? ? ? ? ? ?Forward step ups 4" step x 10, 6" step x 10 reps each side.  No c/o pain today in L knee with 6" step ups. ?Monster walks, forward/back in parallel bars, resisted with red theraband, x 3 reps with 1-2 UE support. ?Bilat heel raises, 2 x 5 reps ? ? ? ? PT Education - 12/20/21 1354   ? ? Education Details Addition to HEP-see instructions   ? Person(s) Educated Patient   ? Methods Explanation;Demonstration;Handout   ? Comprehension Verbalized understanding;Returned demonstration   ? ?  ?  ? ?  ? ? ? PT Short Term Goals - 12/06/21 1339   ? ?  ? PT SHORT TERM GOAL #1  ? Title Pt will be independent with HEP for improved strength, balance, transfers, and gait.  TARGET 12/09/2021   ? Time 4   ? Period Weeks   ? Status Achieved   ?  ? PT SHORT TERM GOAL #2  ? Title Pt will improve single limb stance to 3 sec bilaterally for improved strength to assist with stair and obstacle negotiation.   ? Time 4   ? Period Weeks   ? Status Partially Met   ? ?  ?  ? ?  ? ? ? ? PT Long Term Goals - 12/20/21 1358   ? ?  ? PT LONG TERM GOAL #1  ? Title Pt will be independent with progression of/final HEP for improved functional mobility, strength, balance.  TARGET 12/23/2021   ? Time 6   ? Period Weeks   ? Status Achieved   ?  ? PT LONG TERM GOAL #2  ? Title Pt will improve 5x sit<>stand to less than or equal to 11.5 seconds for improved functional strength.   ? Baseline 12.63 sec   ? Time 6   ? Period Weeks   ? Status On-going   ?  ? PT LONG TERM GOAL #3  ? Title Pt will improve MiniBESTest score to at least 23/28 sec for decreased fall risk.   ? Baseline 21/28   ? Time 6   ? Period Weeks   ? Status On-going   ?  ? PT LONG TERM GOAL #4  ? Title Pt  will report at least 50% improvement in stair negotiation with handrail, mod I for improved stair negotiation to second level of home.   ? Time 6   ? Period Weeks   ? Status On-going   ? ?  ?  ? ?  ? ? ? ? ? ? ? ? Plan - 12/20/21 1359   ? ?  Clinical Impression Statement Assessed LTGs this visit, with pt meeting LTG 1 for HEP.  Made several additions today for further progression of strenghtening, with pt demonstrating understanding.  Pt is reporting feeling more confident with curbs and step negoitation; she reports plans for beginning again to attend [facility] on a regular basis.  She appears she may be ready for d/c next visit.   ? Personal Factors and Comorbidities Comorbidity 3+   ? Comorbidities See problem list   ? Examination-Activity Limitations Locomotion Level;Transfers;Stairs;Stand   ? Examination-Participation Restrictions Community Activity;Other   Fitness activities  ? Stability/Clinical Decision Making Stable/Uncomplicated   ? Rehab Potential Good   ? PT Frequency 2x / week   ? PT Duration 6 weeks   plus eval  ? PT Treatment/Interventions ADLs/Self Care Home Management;Gait training;Stair training;Functional mobility training;Therapeutic activities;Therapeutic exercise;Balance training;Neuromuscular re-education;Patient/family education;DME Instruction   ? PT Next Visit Plan D/C next visit (and plan for return screen in  42month).   ? PT Home Exercise Plan quad set for anterior right knee pain   ? Consulted and Agree with Plan of Care Patient   ? ?  ?  ? ?  ? ? ?Patient will benefit from skilled therapeutic intervention in order to improve the following deficits and impairments:  Abnormal gait, Difficulty walking, Decreased balance, Decreased mobility, Decreased strength, Postural dysfunction, Decreased coordination ? ?Visit Diagnosis: ?Muscle weakness (generalized) ? ?Unsteadiness on feet ? ? ? ? ?Problem List ?Patient Active Problem List  ? Diagnosis Date Noted  ? Vitamin B 12 deficiency 05/30/2021  ? Dry mouth 02/24/2021  ? Anemia 03/16/2020  ? Coronary artery disease involving native coronary artery of native heart without angina pectoris 06/12/2019  ? Abnormal stress test   ? Diastolic dysfunction 020/60/1561 ? Cough  05/10/2018  ? [place] 01/08/[place]  ? Hand pain, right 10/15/2017  ? Vitamin D deficiency 12/20/2016  ? Prediabetes 12/20/2016  ? Shortness of breath on exertion 11/27/2016  ? Preventative

## 2021-12-20 NOTE — Telephone Encounter (Signed)
Patient would like her labs taken on 04/14 to be sent to her [facility] provider Dr. Cherlynn Kaiser. Their fax number is [phone].  ?

## 2021-12-20 NOTE — Telephone Encounter (Signed)
Labs have been faxed.

## 2021-12-20 NOTE — Patient Instructions (Signed)
Access Code: Eddie Dibbles ?URL: [email or web address] ?Date: 12/20/2021 ?Prepared by: [facility] ? ?Program Notes ?perform with hand hovering over handrail/counter ? ?Exercises ?- Sit to Stand with Arms Crossed  - 1 x daily - 5 x weekly - 2 sets - 10 reps ?- Standing Toe Taps  - 1 x daily - 5 x weekly - 2 sets - 20 reps ?- Seated Hamstring Stretch  - 1-2 x daily - 7 x weekly - 1 sets - 3 reps - 30 sec hold ?- Side Stepping with Resistance at Ankles and Counter Support  - 1 x daily - 5 x weekly - 1 sets - 5 reps ?- Alternating Heel Raises  - 1 x daily - 5 x weekly - 2 sets - 10 reps ? ?Added to HEP 12/20/2021: ?- Forward and Backward Monster Walk with Resistance at Ankles and Counter Support  - 1 x daily - 5 x weekly - 1 sets - 3-5 reps ?-Forward step ups, 1x/daily, 5x weekly, 1 set, 10 reps ?

## 2021-12-20 NOTE — Telephone Encounter (Signed)
Pt has a mammogram coming up and the end of May. However, she is now concerned because her left breast seems to be swollen and is hoping she can get her appt moved up. She stated [facility] imaging advised her to call our office first to get approval. Please advise.  ?

## 2021-12-21 ENCOUNTER — Other Ambulatory Visit: Payer: Self-pay

## 2021-12-21 ENCOUNTER — Other Ambulatory Visit: Payer: Self-pay | Admitting: Family Medicine

## 2021-12-21 ENCOUNTER — Other Ambulatory Visit: Payer: Self-pay | Admitting: Nurse Practitioner

## 2021-12-21 DIAGNOSIS — N644 Mastodynia: Secondary | ICD-10-CM

## 2021-12-21 NOTE — Telephone Encounter (Signed)
Contacted the [facility]. They advised [place] to order an ultrasound. [place] has been ordered.  ?

## 2021-12-22 ENCOUNTER — Encounter: Payer: Self-pay | Admitting: Physical Therapy

## 2021-12-22 ENCOUNTER — Ambulatory Visit: Payer: Medicare Other | Admitting: Physical Therapy

## 2021-12-22 DIAGNOSIS — M6281 Muscle weakness (generalized): Secondary | ICD-10-CM | POA: Diagnosis not present

## 2021-12-22 DIAGNOSIS — R2681 Unsteadiness on feet: Secondary | ICD-10-CM | POA: Diagnosis not present

## 2021-12-22 DIAGNOSIS — R2689 Other abnormalities of gait and mobility: Secondary | ICD-10-CM | POA: Diagnosis not present

## 2021-12-22 NOTE — Therapy (Signed)
[facility] ?[address], [address] ?[place], [place], [address] ?Phone: [phone]   Fax:  [phone] ? ?Physical Therapy Discharge Summary ? ?Patient Details  ?Name: Kristin [name] ?MRN: [id number] ?Date of Birth: 1950-11-25 ?Referring Provider (PT): Tat, [name] ? ? ?Progress Note ?Reporting Period 11/10/21 to 12/22/21 ? ?See note below for Objective Data and Assessment of Progress/Goals.  ? ? ? ? ?Encounter Date: 12/22/2021 ? ? PT End of Session - 12/22/21 1444   ? ? Visit Number 10   ? Number of Visits 13   ? Date for PT Re-Evaluation 12/23/21   ? Authorization Type Medicare/[employer]   ? PT Start Time 6962   ? PT Stop Time 1438   ? PT Time Calculation (min) 44 min   ? Equipment Utilized During Treatment Gait belt   ? Activity Tolerance Patient tolerated treatment well   ? Behavior During Therapy [facility] for tasks assessed/performed   Fearful at times with balance activities in MiniBESTest  ? ?  ?  ? ?  ? ? ?Past Medical History:  ?Diagnosis Date  ? Abnormal vaginal Pap smear   ? Acute pharyngitis 02/25/2013  ? Anemia   ? Anxiety   ? Arthritis   ? BCC (basal cell carcinoma of skin) 10/05/2014  ? On back  ? Chicken pox as a child  ? Chronic UTI   ? sees dr Terance Hart  ? Constipation 12/07/2015  ? Depression with anxiety 11/02/2009  ? Qualifier: Diagnosis of  By: [name] LPN, [name]   ? Dermatitis 07/17/2012  ? Esophageal stricture 1994  ? Fibroids   ? Foot pain, bilateral 06/19/2012  ? GERD (gastroesophageal reflux disease)   ? GERD (gastroesophageal reflux disease)   ? Hiatal hernia   ? Hyperglycemia 08/19/2013  ? 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 Hyperlipidemia   ? Hypertension   ? Infertility, female   ? Low back pain 10/17/2007  ? Qualifier: Diagnosis of  By: [name], [name]   ? Measles as a child  ? Obesity   ? Osteoarthritis   ? Parkinson disease ([place])   ? Plantar fasciitis of left foot 06/19/2012  ? Preventative health care 12/19/2015  ? Rheumatoid arthritis ([place])  01/08/2018  ? Rosacea 10/05/2014  ? Swallowing difficulty   ? Urinary frequency 02/25/2013  ? Visual floaters 05/11/2014  ? ? ?Past Surgical History:  ?Procedure Laterality Date  ? ABDOMINAL HYSTERECTOMY  2006  ? total  ? esophageal    ? stretching  ? HERNIA REPAIR N/A   ? laporoscopy    ? LEFT HEART CATH AND CORONARY ANGIOGRAPHY N/A 06/02/2019  ? Procedure: LEFT HEART CATH AND CORONARY ANGIOGRAPHY;  Surgeon: Jettie Booze, MD;  Location: [facility];  Service: Cardiovascular;  Laterality: N/A;  ? TONSILLECTOMY    ? TOTAL HIP ARTHROPLASTY Right 2010  ? wisdom teeth extracted    ? ? ?There were no vitals filed for this visit. ? ? Subjective Assessment - 12/22/21 1346   ? ? Subjective Reports that she is ready to wrap up today. Reports that he knee is not hurting as much on the stairs. Has been doing the steps more often- reports her confidence has gotten a little better with this.   ? Patient Stated Goals Pt's goals for therapy are to improve stairs, balance, walking.   ? Currently in Pain? No/denies   ? ?  ?  ? ?  ? ? ? ? ? [facility]  PT Assessment - 12/22/21 1357   ? ?  ? Assessment  ? Medical Diagnosis Parkinson's disease   ? Referring Provider (PT) Tat, [name]   ? Onset Date/Surgical Date 10/13/21   ?  ? Standardized Balance Assessment  ? Standardized Balance Assessment 10 meter walk test;Five Times Sit to Stand   ? Five times sit to stand comments  10.68 sec   without UEs  ? 10 Meter Walk 11.41 sec   2.87 ft/sec  ?  ? Mini-BESTest  ? Sit To Stand Normal: Comes to stand without use of hands and stabilizes independently.   ? Rise to Toes Normal: Stable for 3 s with maximum height.   after several reps  ? Stand on one leg (left) Moderate: < 20 s   5 sec  ? Stand on one leg (right) Moderate: < 20 s   2 sec  ? Stand on one leg - lowest score 1   ? Compensatory Stepping Correction - Forward Normal: Recovers independently with a single, large step (second realignement is allowed).   ? Compensatory Stepping Correction  - Backward Normal: Recovers independently with a single, large step   ? Compensatory Stepping Correction - Left Lateral Normal: Recovers independently with 1 step (crossover or lateral OK)   ? Compensatory Stepping Correction - Right Lateral Normal: Recovers independently with 1 step (crossover or lateral OK)   ? Stepping Corredtion Lateral - lowest score 2   ? Stance - Feet together, eyes open, firm surface  Normal: 30s   ? Stance - Feet together, eyes closed, foam surface  Normal: 30s   ? Incline - Eyes Closed Normal: Stands independently 30s and aligns with gravity   on second trial  ? Change in Gait Speed Moderate: Unable to change walking speed or signs of imbalance   ? Walk with head turns - Horizontal Moderate: performs head turns with reduction in gait speed.   ? Walk with pivot turns Moderate:Turns with feet close SLOW (>4 steps) with good balance.   ? Step over obstacles Moderate: Steps over box but touches box OR displays cautious behavior by slowing gait.   ? Timed UP & GO with Dual Task Moderate: Dual Task affects either counting OR walking (>10%) when compared to the TUG without Dual Task.   ? Mini-BEST total score 22   ?  ? Timed Up and Go Test  ? Normal TUG (seconds) 11.15   ? Manual TUG (seconds) 11.79   ? Cognitive TUG (seconds) 12.6   ? TUG Comments 5.7% increased with TUG manual, 13% increase with TUG cog   ? ?  ?  ? ?  ? ? ? ? ? ? ? ? ? ? ? ? ? ? ? ? [facility] Adult PT Treatment/Exercise - 12/22/21 1357   ? ?  ? Ambulation/Gait  ? Gait Comments able to perform reciprocal lternating pattern with 1 handrail with good stability- reports 70% improvement in tolerance/confidence   ? ?  ?  ? ?  ? ? ? ? ? ? ? ? ? ? PT Education - 12/22/21 1444   ? ? Education Details update to HEP- to be performed in corner and chair in front for safety; discussion on objective progress and remaining impairments   ? Person(s) Educated Patient   ? Methods Explanation;Demonstration;Tactile cues;Verbal cues;Handout   ?  Comprehension Verbalized understanding;Returned demonstration   ? ?  ?  ? ?  ? ? ? PT Short Term Goals - 12/22/21 1425   ? ?  ?  PT SHORT TERM GOAL #1  ? Title Pt will be independent with HEP for improved strength, balance, transfers, and gait.  TARGET 12/09/2021   ? Time 4   ? Period Weeks   ? Status Achieved   ?  ? PT SHORT TERM GOAL #2  ? Title Pt will improve single limb stance to 3 sec bilaterally for improved strength to assist with stair and obstacle negotiation.   ? Time 4   ? Period Weeks   ? Status Partially Met   met on L, not on R  ? ?  ?  ? ?  ? ? ? ? PT Long Term Goals - 12/22/21 1426   ? ?  ? PT LONG TERM GOAL #1  ? Title Pt will be independent with progression of/final HEP for improved functional mobility, strength, balance.  TARGET 12/23/2021   ? Time 6   ? Period Weeks   ? Status Achieved   ?  ? PT LONG TERM GOAL #2  ? Title Pt will improve 5x sit<>stand to less than or equal to 11.5 seconds for improved functional strength.   ? Baseline 12.63 sec   ? Time 6   ? Period Weeks   ? Status Achieved   ?  ? PT LONG TERM GOAL #3  ? Title Pt will improve MiniBESTest score to at least 23/28 sec for decreased fall risk.   ? Baseline 21/28   ? Time 6   ? Period Weeks   ? Status Not Met   22/28  ?  ? PT LONG TERM GOAL #4  ? Title Pt will report at least 50% improvement in stair negotiation with handrail, mod I for improved stair negotiation to second level of home.   ? Time 6   ? Period Weeks   ? Status Achieved   reports 70%  ? ?  ?  ? ?  ? ? ? ? ? ? ? ? Plan - 12/22/21 1445   ? ? Clinical Impression Statement Patient arrived to session with report of feeling ready for PT D/C today. Patient now able to demonstrate SLS for sec on 2 R LE, and 5 sec on L LE, however not quite meeting goal. Able to perform 5xSTS at quicker pace today, demonstrating a decreased risk of falls. Patient scored 22/28 on MiniBest, demonstrating decreased risk of falls and improved since last session. Patient reports 70% improved ability to  navigate stairs at home and able to demonstrate this safely today. Gait speed is now 2.87 ft/sec, placing patient in the community ambulator category according to norms. Patient reports plans to start attendin

## 2021-12-23 ENCOUNTER — Other Ambulatory Visit: Payer: Medicare Other

## 2021-12-23 ENCOUNTER — Encounter: Payer: Self-pay | Admitting: Gastroenterology

## 2021-12-23 ENCOUNTER — Ambulatory Visit (INDEPENDENT_AMBULATORY_CARE_PROVIDER_SITE_OTHER): Payer: Medicare Other | Admitting: Gastroenterology

## 2021-12-23 VITALS — BP 120/72 | HR 80 | Ht 63.0 in | Wt 236.6 lb

## 2021-12-23 DIAGNOSIS — R143 Flatulence: Secondary | ICD-10-CM

## 2021-12-23 DIAGNOSIS — M6289 Other specified disorders of muscle: Secondary | ICD-10-CM | POA: Diagnosis not present

## 2021-12-23 DIAGNOSIS — R140 Abdominal distension (gaseous): Secondary | ICD-10-CM

## 2021-12-23 DIAGNOSIS — I2510 Atherosclerotic heart disease of native coronary artery without angina pectoris: Secondary | ICD-10-CM | POA: Diagnosis not present

## 2021-12-23 DIAGNOSIS — K5902 Outlet dysfunction constipation: Secondary | ICD-10-CM

## 2021-12-23 MED ORDER — LINACLOTIDE 145 MCG PO CAPS
145.0000 ug | ORAL_CAPSULE | Freq: Every day | ORAL | 3 refills | Status: DC
Start: 2021-12-23 — End: 2022-04-25

## 2021-12-23 NOTE — Patient Instructions (Signed)
Your provider has requested that you go to the basement level for lab work before leaving today. Press "B" on the elevator. The lab is located at the first door on the left as you exit the elevator.  ? ?We have referred you to Pelvic Floor Therapy, They will contact you with your appointment ? ?We have sent the following medications to your pharmacy for you to pick up at your convenience: Linzess '145mg'$  ? ?STOP Miralax ? ?Increase water intake to 8 cups a day ? ?Lactose-Free Diet, Adult ?If you have lactose intolerance, you are not able to digest lactose. Lactose is a natural sugar found mainly in dairy milk and dairy products. A lactose-free diet can help you avoid foods and beverages that contain lactose. ?What are tips for following this plan? ?Reading food labels ?Do not consume foods, beverages, vitamins, minerals, or medicines containing lactose. Read ingredient lists carefully. ?Look for the words "lactose-free" on labels. ?Meal planning ?Use alternatives to dairy milk and foods made with milk products. These include the following: ?Lactose-free milk. ?Soy milk with added calcium and vitamin D. ?Almond milk, coconut milk, rice milk, or other nondairy milk alternatives with added calcium and vitamin D. Note that a lot of these are low in protein. ?Soy products, such as soy yogurt, soy cheese, soy ice cream, and soy-based sour cream. ?Other nut milk products, such as almond yogurt, almond cheese, cashew yogurt, cashew cheese, cashew ice cream, coconut yogurt, and coconut ice cream. ?Medicines, vitamins, and supplements ?Use lactase enzyme drops or tablets as directed by your health care provider. ?Make sure you get enough calcium and vitamin D in your diet. A lactose-free eating plan can be lacking in these important nutrients. ?Take calcium and vitamin D supplements as directed by your health care provider. Talk with your health care provider about supplements if you are not able to get enough calcium and  vitamin D from food. ?What foods should I eat? ? ?Fruits ?All fresh, canned, frozen, or dried fruits and fruit juices that are not processed with lactose. ?Vegetables ?All fresh, frozen, and canned vegetables without cheese, cream, or butter sauces. ?Grains ?Any that are not made with dairy milk or dairy products. ?Meats and other proteins ?Any meat, fish, poultry, and other protein sources that are not made with dairy milk or dairy products. ?Fats and oils ?Any that are not made with dairy milk or dairy products. ?Sweets and desserts ?Any that are not made with dairy milk or dairy products. ?Seasonings and condiments ?Any that are not made with dairy milk or dairy products. ?Calcium ?Calcium is found in many foods that contain lactose and is important for bone health. The amount of calcium you need depends on your age: ?Adults younger than 50 years: 1,000 mg of calcium a day. ?Adults older than 50 years: 1,200 mg of calcium a day. ?If you are not getting enough calcium, you may get it from other sources, including: ?Orange juice that has been fortified with calcium. This means that calcium has been added to the product. There are 300-350 mg of calcium in 1 cup (237 mL) of calcium-fortified orange juice. ?Soy milk fortified with calcium. There are 300-400 mg of calcium in 1 cup (237 mL) of calcium-fortified soy milk. ?Rice or almond milk fortified with calcium. There are 300 mg of calcium in 1 cup (237 mL) of calcium-fortified rice or almond milk. ?Breakfast cereals fortified with calcium. There are 100-1,000 mg of calcium in calcium-fortified breakfast cereals. ?Spinach, cooked. There are  145 mg of calcium in ? cup (90 g) of cooked spinach. ?Edamame, cooked. There are 130 mg of calcium in ? cup (47 g) of cooked edamame. ?Collard greens, cooked. There are 125 mg of calcium in ? cup (85 g) of cooked collard greens. ?Kale, frozen or cooked. There are 90 mg of calcium in ? cup (59 g) of cooked or frozen kale. ?Almonds.  There are 95 mg of calcium in ? cup (35 g) of almonds. ?Broccoli, cooked. There are 60 mg of calcium in 1 cup (156 g) of cooked broccoli. ?The items listed above may not be a complete list of foods and beverages you can eat. Contact a dietitian for more options. ?What foods should I avoid? ?Lactose is found in dairy milk and dairy products, such as: ?Yogurt. ?Cheese. ?Butter. ?Margarine. ?Sour cream. ?Cream. ?Whipped toppings and creamers. ?Ice cream and other dairy-based desserts. ?Lactose is also found in foods or products made with dairy milk or milk ingredients. To find out whether a food contains dairy milk or a milk ingredient, look at the ingredients list. Avoid foods with the statement "May contain milk" and foods that contain: ?Milk powder. ?Whey. ?Curd. ?Lactose. ?Lactoglobulin. ?The items listed above may not be a complete list of foods and beverages to avoid. Contact a dietitian for more information. ?Where to find more information ?[employer] of Diabetes and Digestive and Kidney Diseases: [email or web address] ?Summary ?If you are lactose intolerant, it means that you are not able to digest lactose, a natural sugar found in milk and milk products. ?Following a lactose-free diet can help you manage this condition. ?Calcium is important for bone health and is found in many foods that contain lactose. Talk with your health care provider about other sources of calcium. ?This information is not intended to replace advice given to you by your health care provider. Make sure you discuss any questions you have with your health care provider. ?Document Revised: 07/27/2020 Document Reviewed: 07/27/2020 ?Elsevier Patient Education ? [place]. ? ? ?Due to recent changes in healthcare laws, you may see the results of your imaging and laboratory studies on MyChart before your provider has had a chance to review them.  We understand that in some cases there may be results that are confusing or concerning  to you. Not all laboratory results come back in the same time frame and the provider may be waiting for multiple results in order to interpret others.  Please give [place] 48 hours in order for your provider to thoroughly review all the results before contacting the office for clarification of your results.   ? ?If you are age 56 or older, your body mass index should be between 23-30. Your Body mass index is 41.91 kg/m?Marland Kitchen If this is out of the aforementioned range listed, please consider follow up with your Primary Care Provider. ? ?If you are age 72 or younger, your body mass index should be between 19-25. Your Body mass index is 41.91 kg/m?Marland Kitchen If this is out of the aformentioned range listed, please consider follow up with your Primary Care Provider.  ? ?________________________________________________________ ? ?The [facility] providers would like to encourage you to use [facility] to communicate with providers for non-urgent requests or questions.  Due to long hold times on the telephone, sending your provider a message by [facility] may be a faster and more efficient way to get a response.  Please allow 48 business hours for a response.  Please remember that this is for non-urgent  requests.  ?_______________________________________________________  ? ?Thank you for choosing [facility] ? ?Kavitha Nandigam,MD  ?

## 2021-12-23 NOTE — Progress Notes (Signed)
? ?       ? ?Kristin [name]    [id number]    12-Mar-1951 ? ?Primary Care Physician:Blyth, Bonnita Levan, MD ? ?Referring Physician: Mosie Lukes, MD ?[address] ?[address] ?[place],   [address] ? ? ?Chief complaint:  Constipation ? ?HPI: ? ?71 year old very pleasant female with history of Parkinson's disease, rheumatoid arthritis, large hiatal hernia s/p repair with mesh and gastropexy 2022 here with complaints of excessive gas associated with belching and abdominal bloating ? ?She is experiencing worsening constipation with difficulty evacuating.  She has to strain excessively, is worse when the stools are soft ?No improvement with laxatives ?Denies any rectal bleeding or blood in stool. ? ?EGD June 07, 2020 ?- Benign-appearing esophageal stenosis. Dilated. ?- Large hiatal hernia. ?- Erosive gastropathy with no stigmata of recent bleeding associated with the hiatal hernia, ?c/w Cameron erosions. ?- Multiple gastric polyps. ?- Normal duodenal bulb and second portion of the duodenum. Biopsied. ? ?Colonoscopy June 07, 2020 ?-Mild diverticulosis in the sigmoid colon. There was evidence of an impacted diverticulum. ?- Internal hemorrhoids. ?- The examination was otherwise normal on direct and retroflexion views. ? ?Outpatient Encounter Medications as of 12/23/2021  ?Medication Sig  ? carbidopa-levodopa (SINEMET CR) 50-200 MG tablet TAKE 1 TABLET BY MOUTH EVERYDAY AT BEDTIME  ? carbidopa-levodopa (SINEMET IR) 25-100 MG tablet TAKE 2 TABLETS BY MOUTH AT 7AM, 2TABS AT 11AM, 1TAB AT 4PM  ? carvedilol (COREG) 3.125 MG tablet TAKE 1 TABLET BY MOUTH 2 TIMES DAILY WITH A MEAL.  ? DULoxetine (CYMBALTA) 30 MG capsule TAKE 1 CAPSULE BY MOUTH EVERY DAY  ? escitalopram (LEXAPRO) 20 MG tablet TAKE 1 TABLET BY MOUTH EVERY DAY  ? folic acid (FOLVITE) 1 MG tablet Take 1 mg by mouth 2 (two) times daily.  ? losartan (COZAAR) 50 MG tablet TAKE 1 TABLET BY MOUTH EVERY DAY  ? methotrexate 250 MG/10ML injection methotrexate  sodium 25 mg/mL injection solution ? INJECT 0.8MLS SUBCUTANEOUSLY EVERY 7 DAYS  ? omeprazole (PRILOSEC) 20 MG capsule Take 1 capsule (20 mg total) by mouth 2 (two) times daily as needed.  ? polyethylene glycol (MIRALAX / GLYCOLAX) 17 g packet Take 17 g by mouth every other day.  ? pramipexole (MIRAPEX) 0.5 MG tablet TAKE 1 TABLET BY MOUTH 3 TIMES DAILY.  ? rosuvastatin (CRESTOR) 10 MG tablet TAKE 1 TABLET ON MONDAYS,WEDNESDAYS, AND FRIDAYS  ? solifenacin (VESICARE) 5 MG tablet Take 5 mg by mouth daily.  ? ?No facility-administered encounter medications on file as of 12/23/2021.  ? ? ?Allergies as of 12/23/2021 - Review Complete 12/23/2021  ?Allergen Reaction Noted  ? Prednisone Dermatitis 12/07/2020  ? Bactrim [sulfamethoxazole-trimethoprim] Other (See Comments) 08/27/2012  ? Pb-hyoscy-atropine-scopolamine [phenobarbital-belladonna alk] Other (See Comments) 04/12/2021  ? Penicillins Hives 05/08/2007  ? Scopolamine Other (See Comments) 04/12/2021  ? ? ?Past Medical History:  ?Diagnosis Date  ? Abnormal vaginal Pap smear   ? Acute pharyngitis 02/25/2013  ? Anemia   ? Anxiety   ? Arthritis   ? BCC (basal cell carcinoma of skin) 10/05/2014  ? On back  ? Chicken pox as a child  ? Chronic UTI   ? sees dr Terance Hart  ? Constipation 12/07/2015  ? Depression with anxiety 11/02/2009  ? Qualifier: Diagnosis of  By: [name] LPN, [name]   ? Dermatitis 07/17/2012  ? Esophageal stricture 1994  ? Fibroids   ? Foot pain, bilateral 06/19/2012  ? GERD (gastroesophageal reflux disease)   ? Hiatal hernia   ?  Hyperglycemia 08/19/2013  ? 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 Hyperlipidemia   ? Hypertension   ? Infertility, female   ? Low back pain 10/17/2007  ? Qualifier: Diagnosis of  By: [name], [name]   ? Measles as a child  ? Obesity   ? Osteoarthritis   ? Parkinson disease ([place])   ? Plantar fasciitis of left foot 06/19/2012  ? Preventative health care 12/19/2015  ? Rheumatoid arthritis ([place]) 01/08/2018  ? Rosacea 10/05/2014  ?  Swallowing difficulty   ? Urinary frequency 02/25/2013  ? Visual floaters 05/11/2014  ? ? ?Past Surgical History:  ?Procedure Laterality Date  ? ABDOMINAL HYSTERECTOMY  2006  ? total  ? esophageal    ? stretching  ? HERNIA REPAIR N/A   ? laporoscopy    ? LEFT HEART CATH AND CORONARY ANGIOGRAPHY N/A 06/02/2019  ? Procedure: LEFT HEART CATH AND CORONARY ANGIOGRAPHY;  Surgeon: Jettie Booze, MD;  Location: [facility];  Service: Cardiovascular;  Laterality: N/A;  ? TONSILLECTOMY    ? TOTAL HIP ARTHROPLASTY Right 2010  ? wisdom teeth extracted    ? ? ?Family History  ?Problem Relation Age of Onset  ? Other Mother   ?     arrythmia  ? Mental illness Mother   ?     bipolar  ? Hyperlipidemia Mother   ? Thyroid disease Mother   ? Depression Mother   ? Bipolar disorder Mother   ? Breast cancer Mother 44  ? Heart disease Father   ? Arthritis Father   ?     rheumatoid  ? Hypertension Father   ? Hyperlipidemia Father   ? Depression Sister   ? Mental illness Sister   ?     bipolar  ? Parkinson's disease Sister   ? Arthritis Sister   ? Arthritis Sister   ? Arthritis Sister   ? Heart attack Paternal Grandfather   ? Breast cancer Maternal Aunt 35  ? Breast cancer Maternal Uncle   ? Heart attack Paternal Uncle   ? Colon cancer Neg Hx   ? Esophageal cancer Neg Hx   ? Rectal cancer Neg Hx   ? Stomach cancer Neg Hx   ? ? ?Social History  ? ?Socioeconomic History  ? Marital status: Married  ?  Spouse name: Not on file  ? Number of children: 1  ? Years of education: Not on file  ? Highest education level: Master's degree (e.g., MA, MS, MEng, MEd, MSW, MBA)  ?Occupational History  ? Occupation: Retired Teacher, music  ?Tobacco Use  ? Smoking status: Never  ? Smokeless tobacco: Never  ?Vaping Use  ? Vaping Use: Never used  ?Substance and Sexual Activity  ? Alcohol use: Yes  ?  Comment: occasional wine  ? Drug use: No  ? Sexual activity: Not Currently  ?  Partners: Male  ?Other Topics Concern  ? Not on file  ?Social History  Narrative  ? Lives with husband, no major dietary restrictions, retired from teaching  ?   ? Husband dx with cancer MDS June 2017.  ? Currently in remission. (11/03/16 pc)  ?   ? Pt has one biological child the other 3 are adopted  ?   ? ?Social Determinants of Health  ? ?Financial Resource Strain: Low Risk   ? Difficulty of Paying Living Expenses: Not hard at all  ?Food Insecurity: No Food Insecurity  ? Worried About Charity fundraiser in the Last Year: Never true  ?  Ran Out of Food in the Last Year: Never true  ?Transportation Needs: No Transportation Needs  ? Lack of Transportation (Medical): No  ? Lack of Transportation (Non-Medical): No  ?Physical Activity: Sufficiently Active  ? Days of Exercise per Week: 5 days  ? Minutes of Exercise per Session: 60 min  ?Stress: No Stress Concern Present  ? Feeling of Stress : Not at all  ?Social Connections: Moderately Integrated  ? Frequency of Communication with Friends and Family: More than three times a week  ? Frequency of Social Gatherings with Friends and Family: More than three times a week  ? Attends Religious Services: Never  ? Active Member of Clubs or Organizations: Yes  ? Attends Archivist Meetings: More than 4 times per year  ? Marital Status: Married  ?Intimate Partner Violence: Not At Risk  ? Fear of Current or Ex-Partner: No  ? Emotionally Abused: No  ? Physically Abused: No  ? Sexually Abused: No  ? ? ? ? ?Review of systems: ?All other review of systems negative except as mentioned in the HPI. ? ? ?Physical Exam: ?Vitals:  ? 12/23/21 0836  ?BP: 120/72  ?Pulse: 80  ? ?Body mass index is 41.91 kg/m?. ?Gen:      No acute distress ?HEENT:  sclera anicteric ?Abd:      soft, non-tender; no palpable masses, no distension ?Ext:    No edema ?Neuro: alert and oriented x 3 ?Psych: normal mood and affect ? ?Data Reviewed: ? ?Reviewed labs, radiology imaging, old records and pertinent past GI work up ? ? ?Assessment and Plan/Recommendations: ? ?71 year old  very pleasant female with history of Parkinson's disease, rheumatoid arthritis with worsening constipation, abdominal bloating and excess gas ? ?Constipation secondary to outlet dysfunction likely dyssynergic

## 2021-12-24 LAB — IGA: Immunoglobulin A: 125 mg/dL (ref 70–320)

## 2021-12-24 LAB — TISSUE TRANSGLUTAMINASE, IGA: (tTG) Ab, IgA: <1 U/mL

## 2021-12-30 ENCOUNTER — Telehealth: Payer: Self-pay | Admitting: Gastroenterology

## 2021-12-30 MED ORDER — LINACLOTIDE 72 MCG PO CAPS: 72.0000 ug | ORAL_CAPSULE | Freq: Every day | ORAL | 3 refills | Status: DC

## 2021-12-30 NOTE — Telephone Encounter (Signed)
Spoke with pt and let her know the lower dose of linzess has been sent to her pharmacy.  ?

## 2021-12-30 NOTE — Telephone Encounter (Signed)
Call from patient stating that she has been taking Linzess and it is causing her to have diarrhea. Patient is requesting a call back to discuss if she can possibly get a lower dose. Please advise.  ?

## 2021-12-30 NOTE — Telephone Encounter (Signed)
Yes, please send her prescription for Linzess 72 mcg daily.  Thank you ?

## 2021-12-30 NOTE — Telephone Encounter (Signed)
Pt currently taking linzess 169mg, states it is causing her to have diarrhea. Pt is wanting to know if she can try a lower dose. Please advise. ?

## 2022-01-03 ENCOUNTER — Other Ambulatory Visit: Payer: Medicare Other

## 2022-01-04 ENCOUNTER — Ambulatory Visit
Admission: RE | Admit: 2022-01-04 | Discharge: 2022-01-04 | Disposition: A | Discharge status: Home / Self Care | Payer: Medicare Other | Source: Ambulatory Visit | Attending: Family Medicine | Admitting: Family Medicine

## 2022-01-04 ENCOUNTER — Ambulatory Visit: Admission: RE | Admit: 2022-01-04 | Payer: Medicare Other | Source: Ambulatory Visit

## 2022-01-04 DIAGNOSIS — R928 Other abnormal and inconclusive findings on diagnostic imaging of breast: Secondary | ICD-10-CM | POA: Diagnosis not present

## 2022-01-04 DIAGNOSIS — N644 Mastodynia: Secondary | ICD-10-CM

## 2022-01-11 NOTE — Progress Notes (Signed)
? ?Subjective:  ? ? Patient ID: Kristin [name], female    DOB: [birth year], 71 y.o.   MRN: [id number] ? ?Chief Complaint  ?Patient presents with  ? Follow-up  ? ? ?HPI ?Patient is in today for a follow up. No recent febrile illness or acute hospitalizations. She is largely doing well.  She is preparing to transfer her care to our [facility] [place] office as her Parkinson's worsens and driving becomes more difficult.  She continues to struggle with fatigue and weakness but stays busy and exercises regularly.  Has established with gastroenterology for constipation and they are titrating Linzess.  No bloody or tarry stool.  She denies any polyuria or polydipsia.  She continues to try and maintain a heart healthy diet. Denies CP/palp/HA/congestion/fevers or GU c/o. Taking meds as prescribed  ? ?Past Medical History:  ?Diagnosis Date  ? Abnormal vaginal Pap smear   ? Acute pharyngitis 02/25/2013  ? Anemia   ? Anxiety   ? Arthritis   ? BCC (basal cell carcinoma of skin) 10/05/2014  ? On back  ? Chicken pox as a child  ? Chronic UTI   ? sees dr Terance Hart  ? Constipation 12/07/2015  ? Depression with anxiety 11/02/2009  ? Qualifier: Diagnosis of  By: [name] LPN, [name]   ? Dermatitis 07/17/2012  ? Esophageal stricture 1994  ? Fibroids   ? Foot pain, bilateral 06/19/2012  ? GERD (gastroesophageal reflux disease)   ? Hiatal hernia   ? Hyperglycemia 08/19/2013  ? 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 Hyperlipidemia   ? Hypertension   ? Infertility, female   ? Low back pain 10/17/2007  ? Qualifier: Diagnosis of  By: [name], [name]   ? Measles as a child  ? Obesity   ? Osteoarthritis   ? Parkinson disease ([place])   ? Plantar fasciitis of left foot 06/19/2012  ? Preventative health care 12/19/2015  ? Rheumatoid arthritis ([place]) 01/08/2018  ? Rosacea 10/05/2014  ? Swallowing difficulty   ? Urinary frequency 02/25/2013  ? Visual floaters 05/11/2014  ? ? ?Past Surgical History:  ?Procedure Laterality Date  ? ABDOMINAL HYSTERECTOMY   2006  ? total  ? esophageal    ? stretching  ? HERNIA REPAIR N/A   ? laporoscopy    ? LEFT HEART CATH AND CORONARY ANGIOGRAPHY N/A 06/02/2019  ? Procedure: LEFT HEART CATH AND CORONARY ANGIOGRAPHY;  Surgeon: Jettie Booze, MD;  Location: [facility];  Service: Cardiovascular;  Laterality: N/A;  ? TONSILLECTOMY    ? TOTAL HIP ARTHROPLASTY Right 2010  ? wisdom teeth extracted    ? ? ?Family History  ?Problem Relation Age of Onset  ? Other Mother   ?     arrythmia  ? Mental illness Mother   ?     bipolar  ? Hyperlipidemia Mother   ? Thyroid disease Mother   ? Depression Mother   ? Bipolar disorder Mother   ? Breast cancer Mother 44  ? Heart disease Father   ? Arthritis Father   ?     rheumatoid  ? Hypertension Father   ? Hyperlipidemia Father   ? Depression Sister   ? Mental illness Sister   ?     bipolar  ? Parkinson's disease Sister   ? Arthritis Sister   ? Arthritis Sister   ? Arthritis Sister   ? Breast cancer Maternal Aunt 35  ? Breast cancer Maternal Uncle   ?     22s  ?  Heart attack Paternal Uncle   ? Heart attack Paternal Grandfather   ? Breast cancer Cousin 27  ? Colon cancer Neg Hx   ? Esophageal cancer Neg Hx   ? Rectal cancer Neg Hx   ? Stomach cancer Neg Hx   ? ? ?Social History  ? ?Socioeconomic History  ? Marital status: Married  ?  Spouse name: Not on file  ? Number of children: 1  ? Years of education: Not on file  ? Highest education level: Master's degree (e.g., MA, MS, MEng, MEd, MSW, MBA)  ?Occupational History  ? Occupation: Retired Teacher, music  ?Tobacco Use  ? Smoking status: Never  ? Smokeless tobacco: Never  ?Vaping Use  ? Vaping Use: Never used  ?Substance and Sexual Activity  ? Alcohol use: Yes  ?  Comment: occasional wine  ? Drug use: No  ? Sexual activity: Not Currently  ?  Partners: Male  ?Other Topics Concern  ? Not on file  ?Social History Narrative  ? Lives with husband, no major dietary restrictions, retired from teaching  ?   ? Husband dx with cancer MDS June 2017.   ? Currently in remission. (11/03/16 pc)  ?   ? Pt has one biological child the other 3 are adopted  ?   ? ?Social Determinants of Health  ? ?Financial Resource Strain: Low Risk   ? Difficulty of Paying Living Expenses: Not hard at all  ?Food Insecurity: No Food Insecurity  ? Worried About Charity fundraiser in the Last Year: Never true  ? Ran Out of Food in the Last Year: Never true  ?Transportation Needs: No Transportation Needs  ? Lack of Transportation (Medical): No  ? Lack of Transportation (Non-Medical): No  ?Physical Activity: Sufficiently Active  ? Days of Exercise per Week: 5 days  ? Minutes of Exercise per Session: 60 min  ?Stress: No Stress Concern Present  ? Feeling of Stress : Not at all  ?Social Connections: Moderately Integrated  ? Frequency of Communication with Friends and Family: More than three times a week  ? Frequency of Social Gatherings with Friends and Family: More than three times a week  ? Attends Religious Services: Never  ? Active Member of Clubs or Organizations: Yes  ? Attends Archivist Meetings: More than 4 times per year  ? Marital Status: Married  ?Intimate Partner Violence: Not At Risk  ? Fear of Current or Ex-Partner: No  ? Emotionally Abused: No  ? Physically Abused: No  ? Sexually Abused: No  ? ? ?Outpatient Medications Prior to Visit  ?Medication Sig Dispense Refill  ? carbidopa-levodopa (SINEMET CR) 50-200 MG tablet TAKE 1 TABLET BY MOUTH EVERYDAY AT BEDTIME 90 tablet 1  ? carbidopa-levodopa (SINEMET IR) 25-100 MG tablet TAKE 2 TABLETS BY MOUTH AT 7AM, 2TABS AT 11AM, 1TAB AT 4PM 450 tablet 0  ? carvedilol (COREG) 3.125 MG tablet TAKE 1 TABLET BY MOUTH 2 TIMES DAILY WITH A MEAL. 180 tablet 1  ? DULoxetine (CYMBALTA) 30 MG capsule TAKE 1 CAPSULE BY MOUTH EVERY DAY 90 capsule 1  ? escitalopram (LEXAPRO) 20 MG tablet TAKE 1 TABLET BY MOUTH EVERY DAY 90 tablet 1  ? folic acid (FOLVITE) 1 MG tablet Take 1 mg by mouth 2 (two) times daily.    ? linaclotide (LINZESS) 145 MCG  CAPS capsule Take 1 capsule (145 mcg total) by mouth daily before breakfast. 30 capsule 3  ? linaclotide (LINZESS) 72 MCG capsule Take 1 capsule (72  mcg total) by mouth daily before breakfast. 30 capsule 3  ? losartan (COZAAR) 50 MG tablet TAKE 1 TABLET BY MOUTH EVERY DAY 90 tablet 1  ? methotrexate 250 MG/10ML injection methotrexate sodium 25 mg/mL injection solution ? INJECT 0.8MLS SUBCUTANEOUSLY EVERY 7 DAYS    ? omeprazole (PRILOSEC) 20 MG capsule Take 1 capsule (20 mg total) by mouth 2 (two) times daily as needed. 180 capsule 1  ? polyethylene glycol (MIRALAX / GLYCOLAX) 17 g packet Take 17 g by mouth every other day.    ? pramipexole (MIRAPEX) 0.5 MG tablet TAKE 1 TABLET BY MOUTH 3 TIMES DAILY. 270 tablet 2  ? rosuvastatin (CRESTOR) 10 MG tablet TAKE 1 TABLET ON MONDAYS,WEDNESDAYS, AND FRIDAYS 39 tablet 1  ? solifenacin (VESICARE) 5 MG tablet Take 5 mg by mouth daily.    ? ?No facility-administered medications prior to visit.  ? ? ?Allergies  ?Allergen Reactions  ? Prednisone Dermatitis  ?  Facial ulcers  ? Bactrim [Sulfamethoxazole-Trimethoprim] Other (See Comments)  ?  Oral ulcers and rash  ? Pb-Hyoscy-Atropine-Scopolamine [Phenobarbital-Belladonna Alk] Other (See Comments)  ?  Panic and hallucinations  ? Penicillins Hives  ?  Did it involve swelling of the face/tongue/throat, SOB, or low BP? No ?Did it involve sudden or severe rash/hives, skin peeling, or any reaction on the inside of your mouth or nose? No ?Did you need to seek medical attention at a hospital or doctor's office? No ?When did it last happen?      30+ years ago ?If all above answers are ?NO?, may proceed with cephalosporin use. ?  ? Scopolamine Other (See Comments)  ?  Panic hallu  ? ? ?Review of Systems  ?Constitutional:  Positive for malaise/fatigue. Negative for fever.  ?HENT:  Negative for congestion.   ?Eyes:  Negative for blurred vision.  ?Respiratory:  Positive for shortness of breath.   ?Cardiovascular:  Negative for chest pain,  palpitations and leg swelling.  ?Gastrointestinal:  Positive for constipation. Negative for abdominal pain, blood in stool and nausea.  ?Genitourinary:  Negative for dysuria and frequency.  ?Musculoskele

## 2022-01-12 ENCOUNTER — Ambulatory Visit (INDEPENDENT_AMBULATORY_CARE_PROVIDER_SITE_OTHER): Payer: Medicare Other | Admitting: Family Medicine

## 2022-01-12 ENCOUNTER — Encounter: Payer: Self-pay | Admitting: Family Medicine

## 2022-01-12 VITALS — BP 118/80 | HR 76 | Resp 20 | Ht 63.0 in | Wt 240.0 lb

## 2022-01-12 DIAGNOSIS — K5909 Other constipation: Secondary | ICD-10-CM

## 2022-01-12 DIAGNOSIS — E538 Deficiency of other specified B group vitamins: Secondary | ICD-10-CM

## 2022-01-12 DIAGNOSIS — E559 Vitamin D deficiency, unspecified: Secondary | ICD-10-CM

## 2022-01-12 DIAGNOSIS — E669 Obesity, unspecified: Secondary | ICD-10-CM

## 2022-01-12 DIAGNOSIS — F418 Other specified anxiety disorders: Secondary | ICD-10-CM

## 2022-01-12 DIAGNOSIS — R7303 Prediabetes: Secondary | ICD-10-CM

## 2022-01-12 DIAGNOSIS — I10 Essential (primary) hypertension: Secondary | ICD-10-CM | POA: Diagnosis not present

## 2022-01-12 DIAGNOSIS — G20 Parkinson's disease: Secondary | ICD-10-CM | POA: Diagnosis not present

## 2022-01-12 DIAGNOSIS — I2510 Atherosclerotic heart disease of native coronary artery without angina pectoris: Secondary | ICD-10-CM

## 2022-01-12 DIAGNOSIS — D509 Iron deficiency anemia, unspecified: Secondary | ICD-10-CM | POA: Diagnosis not present

## 2022-01-12 DIAGNOSIS — E782 Mixed hyperlipidemia: Secondary | ICD-10-CM | POA: Diagnosis not present

## 2022-01-12 MED ORDER — ALPRAZOLAM 0.25 MG PO TABS: 0.2500 mg | ORAL_TABLET | Freq: Two times a day (BID) | ORAL | 1 refills | Status: DC | PRN

## 2022-01-12 NOTE — Patient Instructions (Addendum)
Dr Harl Bowie is the gastroenterology ? ?Relief Band for motion sickness  ?Dramamine OTC for motion ? ? ?

## 2022-01-13 LAB — LIPID PANEL
Cholesterol: 149 mg/dL (ref 0–200)
HDL: 64.9 mg/dL (ref >39.00)
LDL Cholesterol: 58 mg/dL (ref 0–99)
NonHDL: 84.47
Total CHOL/HDL Ratio: 2
Triglycerides: 134 mg/dL (ref 0.0–149.0)
VLDL: 26.8 mg/dL (ref 0.0–40.0)

## 2022-01-13 LAB — TSH: TSH: 1.1 u[IU]/mL (ref 0.35–5.50)

## 2022-01-13 LAB — VITAMIN B12: Vitamin B-12: 400 pg/mL (ref 211–911)

## 2022-01-13 LAB — HEMOGLOBIN A1C: Hgb A1c MFr Bld: 5.9 % (ref 4.6–6.5)

## 2022-01-16 NOTE — Assessment & Plan Note (Signed)
Continues to try and stay active and well rested. Follows with neurology. Is going to transfer her care to our [facility] to be closer to home as it is getting harder for her to drive here ?

## 2022-01-16 NOTE — Assessment & Plan Note (Signed)
Encouraged DASH or MIND diet, decrease po intake and increase exercise as tolerated. Needs 7-8 hours of sleep nightly. Avoid trans fats, eat small, frequent meals every 4-5 hours with lean proteins, complex carbs and healthy fats. Minimize simple carbs, high fat foods and processed foods 

## 2022-01-16 NOTE — Assessment & Plan Note (Signed)
Encourage heart healthy diet such as MIND or DASH diet, increase exercise, avoid trans fats, simple carbohydrates and processed foods, consider a krill or fish or flaxseed oil cap daily. Tolerating Crestor ?

## 2022-01-16 NOTE — Assessment & Plan Note (Signed)
Encouraged increased hydration and fiber in diet. Daily probiotics. If bowels not moving can use MOM 2 tbls po in 4 oz of warm prune juice by mouth every 2-3 days. If no results then repeat in 4 hours with  Dulcolax suppository pr, may repeat again in 4 more hours as needed. Seek care if symptoms worsen. Consider daily Miralax and/or Dulcolax if symptoms persist. Is working with gastroenterology and they are titrating [place] ?

## 2022-01-16 NOTE — Assessment & Plan Note (Signed)
hgba1c acceptable, minimize simple carbs. Increase exercise as tolerated.  

## 2022-01-16 NOTE — Assessment & Plan Note (Signed)
Supplement and monitor 

## 2022-01-16 NOTE — Assessment & Plan Note (Signed)
Uses Alprazolam infrequently, denies any concerning side effects, refill allowed today ?

## 2022-01-24 ENCOUNTER — Encounter: Payer: Self-pay | Admitting: Neurology

## 2022-01-27 ENCOUNTER — Encounter: Payer: Medicare Other | Admitting: Family Medicine

## 2022-01-31 DIAGNOSIS — R87620 Atypical squamous cells of undetermined significance on cytologic smear of vagina (ASC-US): Secondary | ICD-10-CM | POA: Diagnosis not present

## 2022-01-31 DIAGNOSIS — Z6841 Body Mass Index (BMI) 40.0 and over, adult: Secondary | ICD-10-CM | POA: Diagnosis not present

## 2022-01-31 DIAGNOSIS — Z803 Family history of malignant neoplasm of breast: Secondary | ICD-10-CM | POA: Diagnosis not present

## 2022-01-31 DIAGNOSIS — Z8052 Family history of malignant neoplasm of bladder: Secondary | ICD-10-CM | POA: Diagnosis not present

## 2022-01-31 DIAGNOSIS — Z779 Other contact with and (suspected) exposures hazardous to health: Secondary | ICD-10-CM | POA: Diagnosis not present

## 2022-01-31 DIAGNOSIS — Z1272 Encounter for screening for malignant neoplasm of vagina: Secondary | ICD-10-CM | POA: Diagnosis not present

## 2022-02-07 ENCOUNTER — Other Ambulatory Visit: Payer: Self-pay | Admitting: Family Medicine

## 2022-02-08 ENCOUNTER — Other Ambulatory Visit: Payer: Self-pay | Admitting: Nurse Practitioner

## 2022-02-11 ENCOUNTER — Telehealth: Payer: Medicare Other | Admitting: Nurse Practitioner

## 2022-02-11 DIAGNOSIS — Z20818 Contact with and (suspected) exposure to other bacterial communicable diseases: Secondary | ICD-10-CM | POA: Diagnosis not present

## 2022-02-11 DIAGNOSIS — J00 Acute nasopharyngitis [common cold]: Secondary | ICD-10-CM

## 2022-02-11 MED ORDER — BENZONATATE 100 MG PO CAPS: 100.0000 mg | ORAL_CAPSULE | Freq: Two times a day (BID) | ORAL | 0 refills | Status: DC | PRN

## 2022-02-11 MED ORDER — CEFDINIR 300 MG PO CAPS: 300.0000 mg | ORAL_CAPSULE | Freq: Two times a day (BID) | ORAL | 0 refills | Status: DC

## 2022-02-11 NOTE — Progress Notes (Signed)
Virtual Visit Consent   Kristin [name], you are scheduled for a virtual visit with [place]-Margaret Hassell Done, [place], a [facility] provider, today.     Just as with appointments in the office, your consent must be obtained to participate.  Your consent will be active for this visit and any virtual visit you may have with one of our providers in the next 365 days.     If you have a MyChart account, a copy of this consent can be sent to you electronically.  All virtual visits are billed to your insurance company just like a traditional visit in the office.    As this is a virtual visit, video technology does not allow for your provider to perform a traditional examination.  This may limit your provider's ability to fully assess your condition.  If your provider identifies any concerns that need to be evaluated in person or the need to arrange testing (such as labs, EKG, etc.), we will make arrangements to do so.     Although advances in technology are sophisticated, we cannot ensure that it will always work on either your end or our end.  If the connection with a video visit is poor, the visit may have to be switched to a telephone visit.  With either a video or telephone visit, we are not always able to ensure that we have a secure connection.     I need to obtain your verbal consent now.   Are you willing to proceed with your visit today? YES   Kristin [name] has provided verbal consent on 02/11/2022 for a virtual visit (video or telephone).   Nansi-Margaret Hassell Done, FNP   Date: 02/11/2022 2:59 PM   Virtual Visit via Video Note   I, Inge[place]-Margaret Hassell Done, connected with Kristin [name] ([id number], 1951/03/18) on 02/11/22 at  3:00 PM EDT by a video-enabled telemedicine application and verified that I am speaking with the correct person using two identifiers.  Location: Patient: Virtual Visit Location Patient: Home Provider: Virtual Visit Location Provider: Mobile   I discussed the limitations of  evaluation and management by telemedicine and the availability of in person appointments. The patient expressed understanding and agreed to proceed.    History of Present Illness: Kristin [name] is a 71 y.o. who identifies as a female who was assigned female at birth, and is being seen today for uri.  HPI: URI  This is a new problem. The current episode started in the past 7 days. The problem has been waxing and waning. Associated symptoms include congestion, coughing, rhinorrhea and a sore throat. Associated symptoms comments: Strep exposure. She has tried acetaminophen for the symptoms.    Review of Systems  HENT:  Positive for congestion, rhinorrhea and sore throat.   Respiratory:  Positive for cough.     Problems:  Patient Active Problem List   Diagnosis Date Noted   Vitamin B 12 deficiency 05/30/2021   Dry mouth 02/24/2021   Anemia 03/16/2020   Coronary artery disease involving native coronary artery of native heart without angina pectoris 06/12/2019   Abnormal stress test    Diastolic dysfunction 92/07/9416   Cough 05/10/2018   Rheumatoid arthritis ([place]) 01/08/2018   Hand pain, right 10/15/2017   Vitamin D deficiency 12/20/2016   Prediabetes 12/20/2016   Shortness of breath on exertion 11/27/2016   Preventative health care 12/19/2015   Constipation 12/07/2015   BCC (basal cell carcinoma of skin) 10/05/2014   Rosacea 10/05/2014   Parkinson's  disease ([place]) 05/11/2014   Screening for cervical cancer 07/17/2012   Foot pain, bilateral 06/19/2012   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   Obesity 11/12/2009   Depression with anxiety 11/02/2009   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   Lumbar back pain with radiculopathy affecting lower extremity 10/17/2007   Essential hypertension 07/05/2007   Osteoarthritis 07/05/2007   Hyperlipidemia 06/26/2007   H/O: iron deficiency anemia 05/08/2007   Hiatal hernia with GERD 05/08/2007    Allergies:  Allergies  Allergen  Reactions   Prednisone Dermatitis    Facial ulcers   Bactrim [Sulfamethoxazole-Trimethoprim] Other (See Comments)    Oral ulcers and rash   Pb-Hyoscy-Atropine-Scopolamine [Phenobarbital-Belladonna Alk] Other (See Comments)    Panic and hallucinations   Penicillins Hives    Did it involve swelling of the face/tongue/throat, SOB, or low BP? No Did it involve sudden or severe rash/hives, skin peeling, or any reaction on the inside of your mouth or nose? No Did you need to seek medical attention at a hospital or doctor's office? No When did it last happen?      30+ years ago If all above answers are "NO", may proceed with cephalosporin use.    Scopolamine Other (See Comments)    Panic hallu   Medications:  Current Outpatient Medications:    ALPRAZolam (XANAX) 0.25 MG tablet, Take 1 tablet (0.25 mg total) by mouth 2 (two) times daily as needed for anxiety., Disp: 30 tablet, Rfl: 1   carbidopa-levodopa (SINEMET CR) 50-200 MG tablet, TAKE 1 TABLET BY MOUTH EVERYDAY AT BEDTIME, Disp: 90 tablet, Rfl: 1   carbidopa-levodopa (SINEMET IR) 25-100 MG tablet, TAKE 2 TABLETS BY MOUTH AT 7AM, 2TABS AT 11AM, 1TAB AT 4PM, Disp: 450 tablet, Rfl: 0   carvedilol (COREG) 3.125 MG tablet, TAKE 1 TABLET BY MOUTH 2 TIMES DAILY WITH A MEAL., Disp: 180 tablet, Rfl: 1   DULoxetine (CYMBALTA) 30 MG capsule, TAKE 1 CAPSULE BY MOUTH EVERY DAY, Disp: 90 capsule, Rfl: 1   escitalopram (LEXAPRO) 20 MG tablet, TAKE 1 TABLET BY MOUTH EVERY DAY, Disp: 90 tablet, Rfl: 1   folic acid (FOLVITE) 1 MG tablet, Take 1 mg by mouth 2 (two) times daily., Disp: , Rfl:    linaclotide (LINZESS) 145 MCG CAPS capsule, Take 1 capsule (145 mcg total) by mouth daily before breakfast., Disp: 30 capsule, Rfl: 3   linaclotide (LINZESS) 72 MCG capsule, Take 1 capsule (72 mcg total) by mouth daily before breakfast., Disp: 30 capsule, Rfl: 3   losartan (COZAAR) 50 MG tablet, TAKE 1 TABLET BY MOUTH EVERY DAY, Disp: 90 tablet, Rfl: 1   methotrexate  250 MG/10ML injection, methotrexate sodium 25 mg/mL injection solution  INJECT 0.8MLS SUBCUTANEOUSLY EVERY 7 DAYS, Disp: , Rfl:    omeprazole (PRILOSEC) 20 MG capsule, Take 1 capsule (20 mg total) by mouth 2 (two) times daily as needed., Disp: 180 capsule, Rfl: 1   polyethylene glycol (MIRALAX / GLYCOLAX) 17 g packet, Take 17 g by mouth every other day., Disp: , Rfl:    pramipexole (MIRAPEX) 0.5 MG tablet, TAKE 1 TABLET BY MOUTH 3 TIMES DAILY., Disp: 270 tablet, Rfl: 2   rosuvastatin (CRESTOR) 10 MG tablet, TAKE 1 TABLET ON MONDAYS,WEDNESDAYS, AND FRIDAYS, Disp: 39 tablet, Rfl: 1   solifenacin (VESICARE) 5 MG tablet, Take 5 mg by mouth daily., Disp: , Rfl:    Vitamin D, Ergocalciferol, (DRISDOL) 1.25 MG (50000 UNIT) CAPS capsule, TAKE 1 CAPSULE (50,000 UNITS TOTAL) BY MOUTH EVERY 7 (SEVEN) DAYS, Disp: 12 capsule, Rfl: 0  Observations/Objective: Patient is well-developed, well-nourished in no acute distress.  Resting comfortably  at home.  Head is normocephalic, atraumatic.  No labored breathing.  Speech is clear and coherent with logical content.  Patient is alert and oriented at baseline.    Assessment and Plan:  Kristin [name] in today with chief complaint of No chief complaint on file.   1. Acute nasopharyngitis  2. Strep throat exposure 1. Take meds as prescribed 2. Use a cool mist humidifier especially during the winter months and when heat has been humid. 3. Use saline nose sprays frequently 4. Saline irrigations of the nose can be very helpful if done frequently.  * 4X daily for 1 week*  * Use of a nettie pot can be helpful with this. Follow directions with this* 5. Drink plenty of fluids 6. Keep thermostat turn down low 7.For any cough or congestion- tessalon perles 8. For fever or aces or pains- take tylenol or ibuprofen appropriate for age and weight.  * for fevers greater than 101 orally you may alternate ibuprofen and tylenol every  3 hours.     Meds ordered this  encounter  Medications   cefdinir (OMNICEF) 300 MG capsule    Sig: Take 1 capsule (300 mg total) by mouth 2 (two) times daily. 1 po BID    Dispense:  20 capsule    Refill:  0    Order Specific Question:   Supervising Provider    Answer:   MILLER, BRIAN [[id number]]   benzonatate (TESSALON) 100 MG capsule    Sig: Take 1 capsule (100 mg total) by mouth 2 (two) times daily as needed for cough.    Dispense:  20 capsule    Refill:  0    Order Specific Question:   Supervising Provider    Answer:   Noemi Chapel [[id number]]     Follow Up Instructions: I discussed the assessment and treatment plan with the patient. The patient was provided an opportunity to ask questions and all were answered. The patient agreed with the plan and demonstrated an understanding of the instructions.  A copy of instructions were sent to the patient via MyChart.  The patient was advised to call back or seek an in-person evaluation if the symptoms worsen or if the condition fails to improve as anticipated.  Time:  I spent 7 minutes with the patient via telehealth technology discussing the above problems/concerns.    Jasiyah-Margaret Hassell Done, FNP

## 2022-02-11 NOTE — Patient Instructions (Signed)
1. Take meds as prescribed °2. Use a cool mist humidifier especially during the winter months and when heat has been humid. °3. Use saline nose sprays frequently °4. Saline irrigations of the nose can be very helpful if done frequently. ° * 4X daily for 1 week* ° * Use of a nettie pot can be helpful with this. Follow directions with this* °5. Drink plenty of fluids °6. Keep thermostat turn down low °7.For any cough or congestion- tessalon perles °8. For fever or aces or pains- take tylenol or ibuprofen appropriate for age and weight. ° * for fevers greater than 101 orally you may alternate ibuprofen and tylenol every  3 hours. °  ° ° °

## 2022-02-17 ENCOUNTER — Ambulatory Visit (INDEPENDENT_AMBULATORY_CARE_PROVIDER_SITE_OTHER): Payer: Medicare Other | Admitting: Family

## 2022-02-17 VITALS — BP 112/61 | HR 93 | Temp 99.1°F | Resp 16 | Wt 234.0 lb

## 2022-02-17 DIAGNOSIS — I2510 Atherosclerotic heart disease of native coronary artery without angina pectoris: Secondary | ICD-10-CM

## 2022-02-17 DIAGNOSIS — J40 Bronchitis, not specified as acute or chronic: Secondary | ICD-10-CM | POA: Diagnosis not present

## 2022-02-17 NOTE — Progress Notes (Signed)
Subjective:   By signing my name below, I, Carylon Perches, attest that this documentation has been prepared under the direction and in the presence of Debbrah Alar NP, 02/17/2022    Patient ID: Kristin [name], female    DOB: 05/09/1951, 71 y.o.   MRN: [id number]  Chief Complaint  Patient presents with   Cough    Complains of cough for about one week, taking cefdinir and benzonatate since 02-12-22    Cough   Patient is in today for an office visit.  Cough: She complains of a cough that appeared a week ago. She went to a specialist for cold-like symptoms on 02/11/2022 and was prescribed 100 Mg of Tessalon and 300 Mg of Cefdinir and is continuing to take the medications. Since the visit, her cough is persistent. She does not think the Tessalon is relieving her cough symptoms.  There are no preventive care reminders to display for this patient.  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   Hyperlipidemia    Hypertension    Infertility, female    Low back pain 10/17/2007   Qualifier: Diagnosis of  By: [name], [name]    Measles as a child   Obesity    Osteoarthritis    Parkinson disease ([place])    Plantar fasciitis of left foot 06/19/2012   Preventative health care 12/19/2015   Rheumatoid arthritis ([place]) 01/08/2018   Rosacea 10/05/2014   Swallowing difficulty    Urinary frequency 02/25/2013   Visual floaters 05/11/2014     Past Surgical History:  Procedure Laterality Date   ABDOMINAL HYSTERECTOMY  2006   total   esophageal     stretching   HERNIA REPAIR N/A    laporoscopy     LEFT HEART CATH AND CORONARY ANGIOGRAPHY N/A 06/02/2019   Procedure: LEFT HEART CATH AND CORONARY ANGIOGRAPHY;  Surgeon: Jettie Booze, MD;  Location: [facility];  Service: Cardiovascular;  Laterality: N/A;   TONSILLECTOMY     TOTAL HIP ARTHROPLASTY Right 2010   wisdom teeth extracted      Family History  Problem Relation Age of Onset   Other Mother        arrythmia   Mental illness Mother        bipolar   Hyperlipidemia Mother    Thyroid disease Mother    Depression Mother    Bipolar disorder Mother    Breast cancer Mother 16   Heart disease Father    Arthritis Father        rheumatoid   Hypertension Father    Hyperlipidemia Father    Depression Sister    Mental illness Sister        bipolar   Parkinson's disease Sister    Arthritis Sister    Arthritis Sister    Arthritis Sister  Breast cancer Maternal Aunt 35   Breast cancer Maternal Uncle        70s   Heart attack Paternal Uncle    Heart attack Paternal Grandfather    Breast cancer Cousin 71   Colon cancer Neg Hx    Esophageal cancer Neg Hx    Rectal cancer Neg Hx    Stomach cancer Neg Hx     Social History   Socioeconomic History   Marital status: Married    Spouse name: Not on file   Number of children: 1   Years of education: Not on file   Highest education level: Master's degree (e.g., MA, MS, MEng, MEd, MSW, MBA)  Occupational History   Occupation: Retired Teacher, music  Tobacco Use   Smoking status: Never   Smokeless tobacco: Never  Vaping Use   Vaping Use: Never used  Substance and Sexual Activity   Alcohol use: Yes    Comment: occasional wine   Drug use: No   Sexual activity: Not Currently    Partners: Male  Other Topics Concern   Not on file  Social History Narrative   Lives with husband, no major  dietary restrictions, retired from teaching      Husband dx with cancer MDS June 2017.   Currently in remission. (11/03/16 pc)      Pt has one biological child the other 3 are adopted      Social Determinants of Health   Financial Resource Strain: Low Risk  (11/03/2021)   Overall Financial Resource Strain (CARDIA)    Difficulty of Paying Living Expenses: Not hard at all  Food Insecurity: No Food Insecurity (11/03/2021)   Hunger Vital Sign    Worried About Running Out of Food in the Last Year: Never true    Ran Out of Food in the Last Year: Never true  Transportation Needs: No Transportation Needs (11/03/2021)   PRAPARE - Hydrologist (Medical): No    Lack of Transportation (Non-Medical): No  Physical Activity: Sufficiently Active (12/07/2021)   Exercise Vital Sign    Days of Exercise per Week: 5 days    Minutes of Exercise per Session: 60 min  Stress: No Stress Concern Present (11/03/2021)   [place]    Feeling of Stress : Not at all  Social Connections: Moderately Integrated (11/03/2021)   Social Connection and Isolation Panel [NHANES]    Frequency of Communication with Friends and Family: More than three times a week    Frequency of Social Gatherings with Friends and Family: More than three times a week    Attends Religious Services: Never    Marine scientist or Organizations: Yes    Attends Music therapist: More than 4 times per year    Marital Status: Married  Human resources officer Violence: Not At Risk (11/03/2021)   Humiliation, Afraid, Rape, and Kick questionnaire    Fear of Current or Ex-Partner: No    Emotionally Abused: No    Physically Abused: No    Sexually Abused: No    Outpatient Medications Prior to Visit  Medication Sig Dispense Refill   ALPRAZolam (XANAX) 0.25 MG tablet Take 1 tablet (0.25 mg total) by mouth 2 (two) times daily as needed for anxiety. 30 tablet  1   benzonatate (TESSALON) 100 MG capsule Take 1 capsule (100 mg total) by mouth 2 (two) times daily as needed for cough. 20 capsule 0   carbidopa-levodopa (  SINEMET CR) 50-200 MG tablet TAKE 1 TABLET BY MOUTH EVERYDAY AT BEDTIME 90 tablet 1   carbidopa-levodopa (SINEMET IR) 25-100 MG tablet TAKE 2 TABLETS BY MOUTH AT 7AM, 2TABS AT 11AM, 1TAB AT 4PM 450 tablet 0   carvedilol (COREG) 3.125 MG tablet TAKE 1 TABLET BY MOUTH 2 TIMES DAILY WITH A MEAL. 180 tablet 1   cefdinir (OMNICEF) 300 MG capsule Take 1 capsule (300 mg total) by mouth 2 (two) times daily. 1 po BID 20 capsule 0   DULoxetine (CYMBALTA) 30 MG capsule TAKE 1 CAPSULE BY MOUTH EVERY DAY 90 capsule 1   escitalopram (LEXAPRO) 20 MG tablet TAKE 1 TABLET BY MOUTH EVERY DAY 90 tablet 1   folic acid (FOLVITE) 1 MG tablet Take 1 mg by mouth 2 (two) times daily.     linaclotide (LINZESS) 145 MCG CAPS capsule Take 1 capsule (145 mcg total) by mouth daily before breakfast. 30 capsule 3   linaclotide (LINZESS) 72 MCG capsule Take 1 capsule (72 mcg total) by mouth daily before breakfast. 30 capsule 3   losartan (COZAAR) 50 MG tablet TAKE 1 TABLET BY MOUTH EVERY DAY 90 tablet 1   methotrexate 250 MG/10ML injection methotrexate sodium 25 mg/mL injection solution  INJECT 0.8MLS SUBCUTANEOUSLY EVERY 7 DAYS     omeprazole (PRILOSEC) 20 MG capsule Take 1 capsule (20 mg total) by mouth 2 (two) times daily as needed. 180 capsule 1   polyethylene glycol (MIRALAX / GLYCOLAX) 17 g packet Take 17 g by mouth every other day.     pramipexole (MIRAPEX) 0.5 MG tablet TAKE 1 TABLET BY MOUTH 3 TIMES DAILY. 270 tablet 2   rosuvastatin (CRESTOR) 10 MG tablet TAKE 1 TABLET ON MONDAYS,WEDNESDAYS, AND FRIDAYS 39 tablet 1   solifenacin (VESICARE) 5 MG tablet Take 5 mg by mouth daily.     Vitamin D, Ergocalciferol, (DRISDOL) 1.25 MG (50000 UNIT) CAPS capsule TAKE 1 CAPSULE (50,000 UNITS TOTAL) BY MOUTH EVERY 7 (SEVEN) DAYS 12 capsule 0   No facility-administered  medications prior to visit.    Allergies  Allergen Reactions   Prednisone Dermatitis    Facial ulcers   Bactrim [Sulfamethoxazole-Trimethoprim] Other (See Comments)    Oral ulcers and rash   Pb-Hyoscy-Atropine-Scopolamine [Phenobarbital-Belladonna Alk] Other (See Comments)    Panic and hallucinations   Penicillins Hives    Did it involve swelling of the face/tongue/throat, SOB, or low BP? No Did it involve sudden or severe rash/hives, skin peeling, or any reaction on the inside of your mouth or nose? No Did you need to seek medical attention at a hospital or doctor's office? No When did it last happen?      30+ years ago If all above answers are "NO", may proceed with cephalosporin use.    Scopolamine Other (See Comments)    Panic hallu    Review of Systems  Respiratory:  Positive for cough.        Objective:    Physical Exam Constitutional:      General: She is not in acute distress.    Appearance: Normal appearance. She is not ill-appearing.  HENT:     Head: Normocephalic and atraumatic.     Right Ear: External ear normal.     Left Ear: External ear normal.  Eyes:     Extraocular Movements: Extraocular movements intact.     Pupils: Pupils are equal, round, and reactive to light.  Cardiovascular:     Rate and Rhythm: Normal rate and regular rhythm.  Heart sounds: Normal heart sounds. No murmur heard.    No gallop.  Pulmonary:     Effort: Pulmonary effort is normal. No respiratory distress.     Breath sounds: Normal breath sounds. No wheezing or rales.  Skin:    General: Skin is warm and dry.  Neurological:     Mental Status: She is alert and oriented to person, place, and time.  Psychiatric:        Mood and Affect: Mood normal.        Behavior: Behavior normal.        Judgment: Judgment normal.     BP 112/61 (BP Location: Right Arm, Patient Position: Sitting, Cuff Size: Small)   Pulse 93   Temp 99.1 F (37.3 C) (Oral)   Resp 16   Wt 234 lb (106.1 kg)    SpO2 96%   BMI 41.45 kg/m  Wt Readings from Last 3 Encounters:  02/17/22 234 lb (106.1 kg)  01/12/22 240 lb (108.9 kg)  12/23/21 236 lb 9.6 oz (107.3 kg)       Assessment & Plan:   Problem List Items Addressed This Visit       Unprioritized   Bronchitis - Primary    Overall improving, on cefdinir.  Normal lung exam.  Reassurance provided to patient that cough can last several weeks.  She will let us know if her cough does not continue to improve.         No orders of the defined types were placed in this encounter.   I, Nance Pear, NP, personally preformed the services described in this documentation.  All medical record entries made by the scribe were at my direction and in my presence.  I have reviewed the chart and discharge instructions (if applicable) and agree that the record reflects my personal performance and is accurate and complete. 02/17/2022   I,Amber Collins,acting as a Education administrator for Nance Pear, NP.,have documented all relevant documentation on the behalf of Nance Pear, NP,as directed by  Nance Pear, NP while in the presence of Nance Pear, NP.   Nance Pear, NP

## 2022-02-17 NOTE — Assessment & Plan Note (Signed)
Overall improving, on cefdinir.  Normal lung exam.  Reassurance provided to patient that cough can last several weeks.  She will let us know if her cough does not continue to improve.

## 2022-03-12 ENCOUNTER — Other Ambulatory Visit: Payer: Self-pay | Admitting: Family Medicine

## 2022-03-15 ENCOUNTER — Other Ambulatory Visit: Payer: Self-pay | Admitting: Family Medicine

## 2022-03-17 ENCOUNTER — Encounter: Payer: Medicare Other | Admitting: Family Medicine

## 2022-03-27 ENCOUNTER — Encounter: Payer: Self-pay | Admitting: Family Medicine

## 2022-03-27 ENCOUNTER — Ambulatory Visit (INDEPENDENT_AMBULATORY_CARE_PROVIDER_SITE_OTHER): Payer: Medicare Other | Admitting: Family Medicine

## 2022-03-27 VITALS — BP 128/70 | HR 82 | Temp 98.1°F | Ht 63.0 in | Wt 237.6 lb

## 2022-03-27 DIAGNOSIS — M069 Rheumatoid arthritis, unspecified: Secondary | ICD-10-CM | POA: Diagnosis not present

## 2022-03-27 DIAGNOSIS — F418 Other specified anxiety disorders: Secondary | ICD-10-CM

## 2022-03-27 DIAGNOSIS — K5909 Other constipation: Secondary | ICD-10-CM

## 2022-03-27 DIAGNOSIS — I10 Essential (primary) hypertension: Secondary | ICD-10-CM

## 2022-03-27 DIAGNOSIS — E782 Mixed hyperlipidemia: Secondary | ICD-10-CM

## 2022-03-27 DIAGNOSIS — I2510 Atherosclerotic heart disease of native coronary artery without angina pectoris: Secondary | ICD-10-CM

## 2022-03-27 DIAGNOSIS — G20 Parkinson's disease: Secondary | ICD-10-CM | POA: Diagnosis not present

## 2022-03-27 NOTE — Patient Instructions (Addendum)
Labs next visit  Lets try to increase the hydration and movement and hopeful for improvement on constipation front- glad you have close follow up with GI  Recommended follow up: Return in about 6 months (around 09/27/2022) for followup or sooner if needed.Schedule b4 you leave.

## 2022-03-27 NOTE — Progress Notes (Signed)
Phone: [phone]   Subjective:  Patient presents today to establish care with me as their new primary care provider. Patient was formerly a patient of Dr. Charlett Blake.  Chief Complaint  Patient presents with   Annual Exam   Constipation    Pt c/o constipation that has been going on for 2 years. She does have a GI doctor but she is frustrated.   See problem oriented charting  The following were reviewed and entered/updated in epic: 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   Hyperlipidemia    Hypertension    Infertility, female    Low back pain 10/17/2007   Qualifier: Diagnosis of  By: [name], [name]    Measles as a child   Obesity    Osteoarthritis    Parkinson disease ([place])    Plantar fasciitis of left foot 06/19/2012   Preventative health care 12/19/2015   Rheumatoid arthritis ([place]) 01/08/2018   Rosacea 10/05/2014   Swallowing difficulty    Urinary frequency 02/25/2013   Visual floaters 05/11/2014   Patient Active Problem List   Diagnosis Date Noted   Coronary artery disease involving native coronary artery of native heart without angina pectoris 06/12/2019    Priority: High   Rheumatoid arthritis ([place]) 01/08/2018    Priority: High   Constipation 12/07/2015    Priority: High   Parkinson's disease ([place]) 05/11/2014    Priority: High   Vitamin B 12 deficiency 05/30/2021    Priority: Medium    Vitamin D deficiency  12/20/2016    Priority: Medium    Prediabetes 12/20/2016    Priority: Medium    Shortness of breath on exertion 11/27/2016    Priority: Medium    Depression with anxiety 11/02/2009    Priority: Medium    Essential hypertension 07/05/2007    Priority: Medium    Osteoarthritis 07/05/2007    Priority: Medium    Hyperlipidemia 06/26/2007    Priority: Medium    H/O: iron deficiency anemia 05/08/2007    Priority: Medium    Hiatal hernia with GERD 05/08/2007    Priority: Medium    Anemia 03/16/2020    Priority: Low   Diastolic dysfunction 66/59/9357    Priority: Low   Hand pain, right 10/15/2017    Priority: Low   BCC (basal cell carcinoma of skin) 10/05/2014    Priority: Low   Rosacea 10/05/2014    Priority: Low   Screening for cervical cancer 07/17/2012    Priority: Low   Foot pain, bilateral 06/19/2012    Priority: Low   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    Priority: Low   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    Priority: Low   Lumbar back pain with radiculopathy affecting lower extremity 10/17/2007    Priority: Low   Morbid obesity ([place])    Past Surgical History:  Procedure Laterality Date   ABDOMINAL HYSTERECTOMY  2006   total   esophageal     stretching   HERNIA REPAIR N/A    laporoscopy     LEFT HEART CATH AND CORONARY ANGIOGRAPHY N/A 06/02/2019   Procedure: LEFT HEART CATH AND CORONARY ANGIOGRAPHY;  Surgeon: Jettie Booze, MD;  Location: [facility];  Service: Cardiovascular;  Laterality: N/A;   TONSILLECTOMY     TOTAL HIP ARTHROPLASTY Right 2010   wisdom teeth extracted      Family History  Problem Relation Age of Onset   Other Mother        arrythmia   Mental illness Mother        bipolar   Hyperlipidemia Mother    Thyroid disease Mother    Depression Mother    Bipolar disorder Mother    Breast cancer Mother 23   Heart disease Father    Arthritis Father        rheumatoid   Hypertension Father    Hyperlipidemia Father     Depression Sister    Mental illness Sister        bipolar   Parkinson's disease Sister    Arthritis Sister    Arthritis Sister    Arthritis Sister    Breast cancer Maternal Aunt 6   Breast cancer Maternal Uncle        45s   Heart attack Paternal Uncle    Heart attack Paternal Grandfather    Breast cancer Cousin 43   Colon cancer Neg Hx    Esophageal cancer Neg Hx    Rectal cancer Neg Hx    Stomach cancer Neg Hx     Medications- reviewed and updated Current Outpatient Medications  Medication Sig Dispense Refill   ALPRAZolam (XANAX) 0.25 MG tablet Take 1 tablet (0.25 mg total) by mouth 2 (two) times daily as needed for anxiety. 30 tablet 1   carbidopa-levodopa (SINEMET CR) 50-200 MG tablet TAKE 1 TABLET BY MOUTH EVERYDAY AT BEDTIME 90 tablet 1   carbidopa-levodopa (SINEMET IR) 25-100 MG tablet TAKE 2 TABLETS BY MOUTH AT 7AM, 2TABS AT 11AM, 1TAB AT 4PM 450 tablet 0   carvedilol (COREG) 3.125 MG tablet TAKE 1 TABLET BY MOUTH 2 TIMES DAILY WITH A MEAL. 180 tablet 1   DULoxetine (CYMBALTA) 30 MG capsule TAKE 1 CAPSULE BY MOUTH EVERY DAY 90 capsule 1   escitalopram (LEXAPRO) 20 MG tablet TAKE 1 TABLET BY MOUTH EVERY DAY 90 tablet 1   folic acid (FOLVITE) 1 MG tablet Take 1 mg by mouth 2 (two) times daily.     linaclotide (LINZESS) 145 MCG CAPS capsule Take 1 capsule (145 mcg total) by mouth daily before breakfast. 30 capsule 3   linaclotide (LINZESS) 72 MCG capsule Take 1 capsule (72 mcg total) by mouth daily before breakfast. 30 capsule 3   losartan (COZAAR) 50 MG tablet TAKE 1 TABLET BY MOUTH EVERY DAY 90 tablet 1   methotrexate 250 MG/10ML injection methotrexate sodium 25 mg/mL injection solution  INJECT 0.8MLS SUBCUTANEOUSLY EVERY 7 DAYS     omeprazole (PRILOSEC) 20 MG capsule TAKE 1 CAPSULE BY MOUTH EVERY DAY AS NEEDED 90 capsule 1   pramipexole (MIRAPEX) 0.5 MG tablet TAKE 1 TABLET BY MOUTH 3 TIMES DAILY. 270 tablet 2   rosuvastatin (CRESTOR) 10 MG tablet TAKE 1 TABLET ON  MONDAYS,WEDNESDAYS, AND FRIDAYS 39 tablet 1   Vitamin D, Ergocalciferol, (DRISDOL) 1.25 MG (50000 UNIT) CAPS capsule TAKE 1 CAPSULE (50,000 UNITS TOTAL)  BY MOUTH EVERY 7 (SEVEN) DAYS 12 capsule 0   solifenacin (VESICARE) 5 MG tablet Take 5 mg by mouth daily.     No current facility-administered medications for this visit.    Allergies-reviewed and updated Allergies  Allergen Reactions   Prednisone Dermatitis    Facial ulcers   Bactrim [Sulfamethoxazole-Trimethoprim] Other (See Comments)    Oral ulcers and rash   Pb-Hyoscy-Atropine-Scopolamine [Phenobarbital-Belladonna Alk] Other (See Comments)    Panic and hallucinations   Penicillins Hives    Did it involve swelling of the face/tongue/throat, SOB, or low BP? No Did it involve sudden or severe rash/hives, skin peeling, or any reaction on the inside of your mouth or nose? No Did you need to seek medical attention at a hospital or doctor's office? No When did it last happen?      30+ years ago If all above answers are "NO", may proceed with cephalosporin use.    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 3 grandsons- 2 in [place] and 56 year old in [place] in 2023      Hobbies: book club, time with friends, time with couples friends, plays pianos   Objective  Objective:  BP 128/70   Pulse 82   Temp 98.1 F (36.7 C)   Ht '5\' 3"'  (1.6 m)   Wt 237 lb 9.6 oz (107.8 kg)   SpO2 96%   BMI 42.09 kg/m  Gen: NAD, resting comfortably CV: RRR no murmurs rubs or gallops Lungs: CTAB no crackles, wheeze, rhonchi Abdomen: soft/nontender/nondistended/normal bowel sounds. No rebound or guarding.  Ext: no edema Skin: warm, dry Neuro: grossly normal, moves all extremities, PERRLA, gets onto tablet without significant assist- uses  walking stick/cane   Assessment and Plan:   #Parkinson's disease-Follows with Dr. Carles Collet  S: Medication: Carbidopa levodopa (Sinemet dose sustained-release and instant release). Also on mirapex 0.5 mg TID (increased ocd tendencies and shoping so trying to cut back) A/P: overall stable- continue follow up with Dr. Carles Collet   #Rheumatoid arthritis S: Medication: Methotrexate injections -prior hydroxychloroquine as well  A/P: overall stable- doing well on meds   #hypertension S: medication: Losartan 50 mg, coreg 3.125 mg BID A/P: Controlled. Continue current medications.    #CAD - minimal plaque on cath 2020 #hyperlipidemia S: Medication:Rosuvastatin 3 days a week, coreg 3.125 mg BID A/P: CAD asymptomatic-continue current medications.  Lipids look excellent with LDL under 70-continue current medication   # Depression with anxiety- family history and mother bipolar as well S: Medication:Lexapro 20 mg, Cymbalta 30 mg, sparing alprazolam 0.25 mg A/P: has worked well for her. Warned of some risk serotonin syndrome  #Constipation- 4 sisters have this as well S: Medication: Linzess 145 mcg alternates with 72 mcg (2 days to have a BM and often large/explosive but smaller dose not effective) 2 years of issues with this-she has a GI doctor Dr. Silverio Decamp (prior Dr. Fuller Plan) -has tried pelvic floor therapy A/P: on a good regimen now but trying to find best fit- alternating doses. We focused on increasing water and movement   # GERD S:Medication:  omeprazole 20 mg A/P: Controlled. Continue current medications.    #Vitamin D deficiency S: Medication: 50,000 units weekly started June 2023- she is actually on 2000 units A/P:  hopefully improving-  update next labs  # B12 deficiency S: Current treatment/medication (oral vs. IM): did not start  A/P: recommended starting this per prior recommendations   # Hyperglycemia/insulin resistance/prediabetes- a1c 6.0 in 2023 S:  Medication: none Lab Results   Component Value Date   HGBA1C 5.9 01/12/2022  A/P: hopefully stable- update a1c next labs. Continue current meds for now  #Overactive bladder S: Medication: Vesicare 5 mg daily - rx through urology (can worsen constipation) - worse with constipation though A/P: she is going to follow up with urology    Recommended follow up: Return in about 6 months (around 09/27/2022) for followup or sooner if needed.Schedule b4 you leave. Future Appointments  Date Time Provider [place]  04/13/2022  2:30 PM Tat, Eustace Quail, DO [facility] None  04/25/2022  3:00 PM Mauri Pole, MD [facility]  08/03/2022 11:45 AM Kerrie Buffalo, OT [facility]  08/03/2022 12:00 PM Frazier Butt, PT [facility]  11/07/2022  1:00 PM [facility] HEALTH COACH [facility]   Lab/Order associations:   ICD-10-CM   1. Rheumatoid arthritis, involving unspecified site, unspecified whether rheumatoid factor present ([place])  M06.9     2. Coronary artery disease involving native coronary artery of native heart without angina pectoris  I25.10     3. Parkinson's disease ([place])  G20     4. Other constipation  K59.09     5. Mixed hyperlipidemia  E78.2     6. Essential hypertension  I10     7. Depression with anxiety  F41.8     8. Morbid obesity (HCC) Chronic- mild improvement- Encouraged need for healthy eating, regular movement/ exercise, weight loss.   E66.01      Return precautions advised.  Garret Reddish, MD

## 2022-04-04 DIAGNOSIS — D1801 Hemangioma of skin and subcutaneous tissue: Secondary | ICD-10-CM | POA: Diagnosis not present

## 2022-04-04 DIAGNOSIS — R29890 Loss of height: Secondary | ICD-10-CM | POA: Diagnosis not present

## 2022-04-04 DIAGNOSIS — N958 Other specified menopausal and perimenopausal disorders: Secondary | ICD-10-CM | POA: Diagnosis not present

## 2022-04-04 DIAGNOSIS — L814 Other melanin hyperpigmentation: Secondary | ICD-10-CM | POA: Diagnosis not present

## 2022-04-04 DIAGNOSIS — Z803 Family history of malignant neoplasm of breast: Secondary | ICD-10-CM | POA: Diagnosis not present

## 2022-04-04 DIAGNOSIS — L918 Other hypertrophic disorders of the skin: Secondary | ICD-10-CM | POA: Diagnosis not present

## 2022-04-04 DIAGNOSIS — L821 Other seborrheic keratosis: Secondary | ICD-10-CM | POA: Diagnosis not present

## 2022-04-04 DIAGNOSIS — L570 Actinic keratosis: Secondary | ICD-10-CM | POA: Diagnosis not present

## 2022-04-04 DIAGNOSIS — D485 Neoplasm of uncertain behavior of skin: Secondary | ICD-10-CM | POA: Diagnosis not present

## 2022-04-04 DIAGNOSIS — M06071 Rheumatoid arthritis without rheumatoid factor, right ankle and foot: Secondary | ICD-10-CM | POA: Diagnosis not present

## 2022-04-04 DIAGNOSIS — M06072 Rheumatoid arthritis without rheumatoid factor, left ankle and foot: Secondary | ICD-10-CM | POA: Diagnosis not present

## 2022-04-04 DIAGNOSIS — M13851 Other specified arthritis, right hip: Secondary | ICD-10-CM | POA: Diagnosis not present

## 2022-04-06 DIAGNOSIS — M059 Rheumatoid arthritis with rheumatoid factor, unspecified: Secondary | ICD-10-CM | POA: Diagnosis not present

## 2022-04-06 DIAGNOSIS — E559 Vitamin D deficiency, unspecified: Secondary | ICD-10-CM | POA: Diagnosis not present

## 2022-04-06 DIAGNOSIS — Z79899 Other long term (current) drug therapy: Secondary | ICD-10-CM | POA: Diagnosis not present

## 2022-04-06 DIAGNOSIS — M159 Polyosteoarthritis, unspecified: Secondary | ICD-10-CM | POA: Diagnosis not present

## 2022-04-07 DIAGNOSIS — N3941 Urge incontinence: Secondary | ICD-10-CM | POA: Diagnosis not present

## 2022-04-07 DIAGNOSIS — N3020 Other chronic cystitis without hematuria: Secondary | ICD-10-CM | POA: Diagnosis not present

## 2022-04-11 NOTE — Progress Notes (Unsigned)
Assessment/Plan:   1.  Parkinsons Disease             -Continue pramipexole 0.5 mg 3 times per day.  Higher dosages with SE             -Continue carbidopa/levodopa 25/100, 2/2/1 (can chew half to 1 in the middle of the night if awakens with tremor).  She will work on taking that last one at a regular time more faithfully to help avoid symptoms in the evening.             -She will continue Carbidopa/levodopa 50/200 at night.  This has definitely helped restless leg and has helped most of the nighttime cramping of the feet and legs.  -We discussed the new skin biopsies for alpha-synuclein.  Discussed that they have about 95-96% sensitivity and specificity.  Discussed utility of the biopsy but we agreed she didn't need that  -discussed amantadine/entacapone/opicapone.  She wants to hold on those options for now and try to take current meds more on schedule  -PT referral sent - she is slower and more tenuous today 2.  Depression and anxiety             - She is on a combination of Lexapro and Cymbalta 3.  Dysphagia             -last MBE May, 2021 with mild pharyngeal cervical esophageal deficits.  It was recommended to consider esophageal assessment. She is now following with GI 4.  Rheumatoid arthritis             -On methotrexate.  Following with [facility] 5.  REM behavior disorder             -Understands safety associated with this.  Does not want more medications. 6.  Constipation             -Following with Dr. Juliann Mule  -On Linzess and working     Subjective:   Kristin [name] was seen today in follow up for Parkinsons disease.  My previous records were reviewed prior to todays visit as well as outside records available to me.  Husband with patient and supplements history.  She just saw [facility]ology for her follow-up for rheumatoid August 3.  Notes reviewed.  She has stopped her Plaquenil.  She is on methotrexate.  She follows with Dr. Juliann Mule for gastroenterology.   She was started on Linzess.  In regards to Parkinson's itself, she is on both levodopa and low-dose pramipexole.  She reports that she got a different generic med for carbidopa/levodopa and it didn't work well and she went back to the pharmacy and they gave her the other brand and it worked well again.    Current prescribed movement disorder medications: Carbidopa/levodopa 25/100, 2 tablets in the morning, 2 in the afternoon and 1 in the evening (7am/then between 11am-noon/4pm) Carbidopa/levodopa 50/200 at bedtime Pramipexole 0.5 mg 3 times per day   Prior meds: pramipexole (higher dosages with mild compulsive behavior - cleaning/some shopping)   ALLERGIES:   Allergies  Allergen Reactions   Prednisone Dermatitis    Facial ulcers   Bactrim [Sulfamethoxazole-Trimethoprim] Other (See Comments)    Oral ulcers and rash   Pb-Hyoscy-Atropine-Scopolamine [Phenobarbital-Belladonna Alk] Other (See Comments)    Panic and hallucinations   Penicillins Hives    Did it involve swelling of the face/tongue/throat, SOB, or low BP? No Did it involve sudden or severe rash/hives, skin peeling, or any reaction on the  inside of your mouth or nose? No Did you need to seek medical attention at a hospital or doctor's office? No When did it last happen?      30+ years ago If all above answers are "NO", may proceed with cephalosporin use.    Scopolamine Other (See Comments)    Panic hallu    CURRENT MEDICATIONS:  Outpatient Encounter Medications as of 04/13/2022  Medication Sig   ALPRAZolam (XANAX) 0.25 MG tablet Take 1 tablet (0.25 mg total) by mouth 2 (two) times daily as needed for anxiety.   carbidopa-levodopa (SINEMET CR) 50-200 MG tablet TAKE 1 TABLET BY MOUTH EVERYDAY AT BEDTIME   carbidopa-levodopa (SINEMET IR) 25-100 MG tablet TAKE 2 TABLETS BY MOUTH AT 7AM, 2TABS AT 11AM, 1TAB AT 4PM   carvedilol (COREG) 3.125 MG tablet TAKE 1 TABLET BY MOUTH 2 TIMES DAILY WITH A MEAL.   DULoxetine (CYMBALTA) 30 MG  capsule TAKE 1 CAPSULE BY MOUTH EVERY DAY   escitalopram (LEXAPRO) 20 MG tablet TAKE 1 TABLET BY MOUTH EVERY DAY   folic acid (FOLVITE) 1 MG tablet Take 1 mg by mouth 2 (two) times daily.   linaclotide (LINZESS) 145 MCG CAPS capsule Take 1 capsule (145 mcg total) by mouth daily before breakfast.   losartan (COZAAR) 50 MG tablet TAKE 1 TABLET BY MOUTH EVERY DAY   methotrexate 250 MG/10ML injection methotrexate sodium 25 mg/mL injection solution  INJECT 0.8MLS SUBCUTANEOUSLY EVERY 7 DAYS   omeprazole (PRILOSEC) 20 MG capsule TAKE 1 CAPSULE BY MOUTH EVERY DAY AS NEEDED   pramipexole (MIRAPEX) 0.5 MG tablet TAKE 1 TABLET BY MOUTH 3 TIMES DAILY.   rosuvastatin (CRESTOR) 10 MG tablet TAKE 1 TABLET ON MONDAYS,WEDNESDAYS, AND FRIDAYS   linaclotide (LINZESS) 72 MCG capsule Take 1 capsule (72 mcg total) by mouth daily before breakfast.   Vitamin D, Ergocalciferol, (DRISDOL) 1.25 MG (50000 UNIT) CAPS capsule TAKE 1 CAPSULE (50,000 UNITS TOTAL) BY MOUTH EVERY 7 (SEVEN) DAYS   No facility-administered encounter medications on file as of 04/13/2022.    Objective:   PHYSICAL EXAMINATION:    VITALS:   Vitals:   04/13/22 1419  BP: 102/68  Pulse: 63  SpO2: 98%  Weight: 241 lb (109.3 kg)  Height: '5\' 4"'  (1.626 m)       GEN:  The patient appears stated age and is in NAD. HEENT:  Normocephalic, atraumatic.  The mucous membranes are moist.   Neurological examination:  Orientation: The patient is alert and oriented x3. Cranial nerves: There is good facial symmetry without significant hypomimia.  EOMI.   The speech is fluent and clear. Soft palate rises symmetrically and there is no tongue deviation. Hearing is intact to conversational tone. Sensation: Sensation is intact to light touch throughout Motor: Strength is at least antigravity x4.  Movement examination: Tone: There is normal tone today Abnormal movements: rare LLE tremor (same as previous) Coordination:  There is good RAMs today in the  bilateral UE/LE.  No decremation Gait and Station: The patient has no difficulty arising out of a deep-seated chair without the use of the hands. The patients gait is slow and tenuous but she isn't shuffling.    I have reviewed and interpreted the following labs independently    Chemistry      Component Value Date/Time   NA 140 12/16/2021 0823   NA 144 12/05/2019 0935   K 4.1 12/16/2021 0823   CL 104 12/16/2021 0823   CO2 29 12/16/2021 0823   BUN 16 12/16/2021 7035  BUN 13 12/05/2019 0935   CREATININE 0.68 12/16/2021 0823   CREATININE 0.71 01/04/2015 1046      Component Value Date/Time   CALCIUM 9.4 12/16/2021 0823   ALKPHOS 88 12/16/2021 0823   AST 19 12/16/2021 0823   ALT 9 12/16/2021 0823   BILITOT 0.6 12/16/2021 0823   BILITOT 0.4 11/27/2016 1123       Lab Results  Component Value Date   WBC 6.3 12/16/2021   HGB 13.0 12/16/2021   HCT 39.3 12/16/2021   MCV 92.4 12/16/2021   PLT 202.0 12/16/2021    Lab Results  Component Value Date   TSH 1.10 01/12/2022     Total time spent on today's visit was 38 minutes, including both face-to-face time and nonface-to-face time.  Time included that spent on review of records (prior notes available to me/labs/imaging if pertinent), discussing treatment and goals, answering patient's questions and coordinating care.  Cc:  Marin Olp, MD

## 2022-04-13 ENCOUNTER — Encounter: Payer: Self-pay | Admitting: Neurology

## 2022-04-13 ENCOUNTER — Ambulatory Visit: Payer: Medicare Other | Admitting: Neurology

## 2022-04-13 ENCOUNTER — Ambulatory Visit (INDEPENDENT_AMBULATORY_CARE_PROVIDER_SITE_OTHER): Payer: Medicare Other | Admitting: Neurology

## 2022-04-13 VITALS — BP 102/68 | HR 63 | Ht 64.0 in | Wt 241.0 lb

## 2022-04-13 DIAGNOSIS — G20 Parkinson's disease: Secondary | ICD-10-CM | POA: Diagnosis not present

## 2022-04-13 DIAGNOSIS — K5901 Slow transit constipation: Secondary | ICD-10-CM | POA: Diagnosis not present

## 2022-04-13 DIAGNOSIS — I2510 Atherosclerotic heart disease of native coronary artery without angina pectoris: Secondary | ICD-10-CM | POA: Diagnosis not present

## 2022-04-13 MED ORDER — PRAMIPEXOLE DIHYDROCHLORIDE 0.5 MG PO TABS: 0.5000 mg | ORAL_TABLET | Freq: Three times a day (TID) | ORAL | 2 refills | Status: DC

## 2022-04-13 NOTE — Patient Instructions (Signed)
Local and Online Resources for Power over Parkinson's Group August 2023  LOCAL [place] PARKINSON'S GROUPS  Power over Parkinson's Group:   Power Over Parkinson's Patient Education Group will be Wednesday, August 9th-*Hybrid meting*- in person at [facility] location and via [facility] at 2:00 pm.   Upcoming Power over Parkinson's Meetings:  2nd Wednesdays of the month at 2 pm:  August 9th, September 13th, October 11th Contact Amy Marriott at [email or web address] if interested in participating in this group Parkinson's Care Partners Group:    3rd Mondays, Contact Misty Paladino Atypical Parkinsonian Patient Group:   4th Wednesdays, Contact Misty Paladino If you are interested in participating in these groups with Misty, please contact her directly for how to join those meetings.  Her contact information is [email or web address].    LOCAL EVENTS AND NEW OFFERINGS Parkinson's T-shirts for sale!  Designed by a local group member, with funds going to [employer]. X-Large sizes available.  Contact Misty to purchase  New PWR! Moves [employer] Instructor-Led Class offering at [employer]!  Wednesdays 1-2 pm, starting April 12th.   Contact Ronalee Belts Sabin at [employer], [email or web address]   [place]:  [email or web address] PD Health at Home continues:  Mindfulness Mondays, Wellness Wednesdays, Fitness Fridays  Upcoming Education:   Care Partners:  Why they Matter and why their Needs Matter.  Wednesday, August 9th 1-2 pm Invisible Symptoms:  NonMotor Symptoms.  Wednesday, August 16th 1-2 pm Expert Briefing:   Parkinson's Disease and the Bladder.  Wednesday, September 13th 1:00-2:00 pm Register for expert briefings (webinars) at [email or web address] Please check out their website to sign up for emails and see their full online  offerings   [place]:  [email or web address]  Third Thursday Webinars:  On the third Thursday of every month at 12 p.m. ET, join our free live webinars to learn about various aspects of living with Parkinson's disease and our work to speed medical breakthroughs. Upcoming Webinar: Too Much or Not Enough:  Dyskinesia and "off" time in Parkinson's (Replay).  Thursday, August 17th at 12 noon. Check out additional information on their website to see their full online offerings  [facility]:  [email or web address] Upcoming Webinar:   Stay tuned Webinar Series:  Living with Parkinson's Meetup.   Third Thursdays each month, 3 pm Care Partner Monthly Meetup.  With Robin Searing Phinney.  First Tuesday of each month, 2 pm Check out additional information to Live Well Today on their website  Parkinson and Movement Disorders (PMD) Alliance:  www.pmdalliance.org [employer] Online:  Online Education Events Sign up for emails, which are sent weekly to give you updates on programming and online offerings  Parkinson's Association of the [place]:  www.[email or web address] Information on online support groups, education events, and online exercises including Yoga, Parkinson's exercises and more-LOTS of information on links to PD resources and online events Virtual Support Group through Parkinson's Association of the [place]; next one is scheduled for Wednesday, September 6th at 2 pm. (These are typically scheduled for the 1st Wednesday of the month at 2 pm).  Visit website for details. Save the date for "Caring for Parkinson's-Caring for You", 9th Annual Symposium.  In-person event in [place].  September 9th.  More info on registration to come. MOVEMENT AND EXERCISE OPPORTUNITIES PWR! Moves Classes at [place].  Wednesdays 10 and 11 am.   Contact Amy Marriott, PT [email or web address] if interested. NEW PWR! Moves Class offering at [employer].  Wednesdays 1-2 pm, starting April 12th.  Contact Caron Presume at [place], [email or web address] Here is a link to the PWR!Moves classes on Zoom from [place] - Daily Mon-Sat at 10:00. Via Zoom, FREE and open to all.  There is also a link below via Facebook if you use that platform.  [email or web address]  Parkinson's Wellness Recovery (PWR! Moves)  www.[email or web address] Info on the PWR! Virtual Experience:  You will have access to our expertise through self-assessment, guided plans that start with the PD-specific fundamentals, educational content, tips, Q&A with an expert, and a growing Art therapist of PD-specific pre-recorded and live exercise classes of varying types and intensity - both physical and cognitive! If that is not enough, we offer 1:1 wellness consultations (in-person or virtual) to personalize your PWR! Research scientist (medical).  [place] Fridays:  As part of the PD Health @ Home program, this free video series focuses each week on one aspect of fitness designed to support people living with Parkinson's.  These weekly videos highlight the Reader recent fitness guidelines for people with Parkinson's disease. ModemGamers.si Dance for PD website is offering free, live-stream classes throughout the week, as well as links to [employer] of classes:  [email or web address]/ Virtual dance and Pilates for Parkinson's classes: Click on the Community Tab> Parkinson's Movement Initiative Tab.  To register for classes and for more information, visit [email or web address] and click the "community" tab.  [employer] Parkinson's Cycling Classes  [employer]:  Thursdays @ Noon-Live  classes at [employer] ([employer] at [email or web address] or ([phone]) [employer]: Virtual Classes Mondays and Thursdays Jeanette Caprice classes Tuesday, Wednesday and Thursday (contact [place] at [email or web address]  or [phone]) [place] Varied levels of classes are offered Tuesdays and Thursdays at [facility].  Stretching with Verdis Frederickson weekly class is also offered for people with Parkinson's To observe a class or for more information, call [phone] or email Hezzie Bump at [email or web address] ADDITIONAL SUPPORT AND RESOURCES Well[employer]Spring [employer]:Online Caregiver Education Opportunities:  www.well-[email or web address]/caregiver-education/caregiver-support-group.  You may also contact Vickki Muff at [email or web address] or [phone].    Well-Spring Navigator:  [phone] program, a free service to help individuals and families through the journey of determining care for older adults.  The "Navigator" is a [employer], Education officer, museum, who will speak with a prospective client and/or loved ones to provide an assessment of the situation and a set of recommendations for a personalized care plan -- all free of charge, and whether Well[employer]Spring [employer] offers the needed service or not. If the need is not a service we provide, we are well-connected with reputable programs in town that we can refer you to.  www.well-springsolutions.org or to speak with the Navigator, call [phone].

## 2022-04-14 DIAGNOSIS — M79671 Pain in right foot: Secondary | ICD-10-CM | POA: Diagnosis not present

## 2022-04-15 ENCOUNTER — Other Ambulatory Visit: Payer: Self-pay | Admitting: Neurology

## 2022-04-15 ENCOUNTER — Other Ambulatory Visit: Payer: Self-pay | Admitting: Family Medicine

## 2022-04-15 DIAGNOSIS — G20 Parkinson's disease: Secondary | ICD-10-CM

## 2022-04-17 ENCOUNTER — Other Ambulatory Visit: Payer: Self-pay

## 2022-04-17 DIAGNOSIS — G20 Parkinson's disease: Secondary | ICD-10-CM

## 2022-04-17 MED ORDER — CARBIDOPA-LEVODOPA 25-100 MG PO TABS: ORAL_TABLET | ORAL | 0 refills | Status: DC

## 2022-04-17 MED ORDER — CARBIDOPA-LEVODOPA ER 50-200 MG PO TBCR: EXTENDED_RELEASE_TABLET | ORAL | 0 refills | Status: DC

## 2022-04-18 ENCOUNTER — Ambulatory Visit: Payer: Medicare Other | Admitting: Neurology

## 2022-04-20 ENCOUNTER — Ambulatory Visit: Payer: Medicare Other | Admitting: Neurology

## 2022-04-24 ENCOUNTER — Ambulatory Visit: Payer: Medicare Other | Admitting: Physical Therapy

## 2022-04-25 ENCOUNTER — Ambulatory Visit (INDEPENDENT_AMBULATORY_CARE_PROVIDER_SITE_OTHER): Payer: Medicare Other | Admitting: Gastroenterology

## 2022-04-25 ENCOUNTER — Encounter: Payer: Self-pay | Admitting: Gastroenterology

## 2022-04-25 VITALS — BP 118/68 | HR 76 | Ht 63.0 in | Wt 238.4 lb

## 2022-04-25 DIAGNOSIS — K5904 Chronic idiopathic constipation: Secondary | ICD-10-CM

## 2022-04-25 DIAGNOSIS — I2510 Atherosclerotic heart disease of native coronary artery without angina pectoris: Secondary | ICD-10-CM | POA: Diagnosis not present

## 2022-04-25 MED ORDER — LINACLOTIDE 145 MCG PO CAPS: 145.0000 ug | ORAL_CAPSULE | Freq: Every day | ORAL | 11 refills | Status: DC

## 2022-04-25 NOTE — Progress Notes (Signed)
         Kristin [name]    [id number]    12/11/1950  Primary Care Physician:Hunter, Stephen O, MD  Referring Physician: Blyth, Stacey A, MD [address] [place],  [place] [address]   Chief complaint:  Constipation  HPI:  71-year-old very pleasant female with history of Parkinson's disease, rheumatoid arthritis, large hiatal hernia s/p repair with mesh and gastropexy 2022 here for follow-up visit for chronic constipation.  Overall she is doing better on Linzess .Denies any rectal bleeding or blood in stool.   EGD June 07, 2020 - Benign-appearing esophageal stenosis. Dilated. - Large hiatal hernia. - Erosive gastropathy with no stigmata of recent bleeding associated with the hiatal hernia, c/w Cameron erosions. - Multiple gastric polyps. - Normal duodenal bulb and second portion of the duodenum. Biopsied.   Colonoscopy June 07, 2020 -Mild diverticulosis in the sigmoid colon. There was evidence of an impacted diverticulum. - Internal hemorrhoids. - The examination was otherwise normal on direct and retroflexion views.   Outpatient Encounter Medications as of 04/25/2022  Medication Sig   ALPRAZolam (XANAX) 0.25 MG tablet Take 1 tablet (0.25 mg total) by mouth 2 (two) times daily as needed for anxiety.   carbidopa-levodopa (SINEMET CR) 50-200 MG tablet TAKE 1 TABLET BY MOUTH EVERYDAY AT BEDTIME   carvedilol (COREG) 3.125 MG tablet TAKE 1 TABLET BY MOUTH 2 TIMES DAILY WITH A MEAL.   DULoxetine (CYMBALTA) 30 MG capsule TAKE 1 CAPSULE BY MOUTH EVERY DAY   escitalopram (LEXAPRO) 20 MG tablet TAKE 1 TABLET BY MOUTH EVERY DAY   folic acid (FOLVITE) 1 MG tablet Take 1 mg by mouth 2 (two) times daily.   linaclotide (LINZESS) 145 MCG CAPS capsule Take 1 capsule (145 mcg total) by mouth daily before breakfast.   losartan (COZAAR) 50 MG tablet TAKE 1 TABLET BY MOUTH EVERY DAY   methotrexate 250 MG/10ML injection methotrexate sodium 25 mg/mL injection solution  INJECT  0.8MLS SUBCUTANEOUSLY EVERY 7 DAYS   omeprazole (PRILOSEC) 20 MG capsule TAKE 1 CAPSULE BY MOUTH EVERY DAY AS NEEDED   pramipexole (MIRAPEX) 0.5 MG tablet Take 1 tablet (0.5 mg total) by mouth 3 (three) times daily.   rosuvastatin (CRESTOR) 10 MG tablet TAKE 1 TABLET BY MOUTH ON MONDAYS,WEDNESDAYS, AND FRIDAYS   solifenacin (VESICARE) 10 MG tablet Take by mouth daily.   carbidopa-levodopa (SINEMET IR) 25-100 MG tablet TAKE 2 TABLETS BY MOUTH AT 7AM, 2TABS AT 11AM, 1 TAB AT 4PM (Patient not taking: Reported on 04/25/2022)   carbidopa-levodopa (SINEMET IR) 25-100 MG tablet TAKE 2 TABLETS BY MOUTH AT 7AM, 2TABS AT 11AM, 1TAB AT 4PM   linaclotide (LINZESS) 72 MCG capsule Take 1 capsule (72 mcg total) by mouth daily before breakfast.   Vitamin D, Ergocalciferol, (DRISDOL) 1.25 MG (50000 UNIT) CAPS capsule TAKE 1 CAPSULE (50,000 UNITS TOTAL) BY MOUTH EVERY 7 (SEVEN) DAYS   [DISCONTINUED] carbidopa-levodopa (SINEMET CR) 50-200 MG tablet TAKE 1 TABLET BY MOUTH EVERYDAY AT BEDTIME   No facility-administered encounter medications on file as of 04/25/2022.    Allergies as of 04/25/2022 - Review Complete 04/13/2022  Allergen Reaction Noted   Prednisone Dermatitis 12/07/2020   Bactrim [sulfamethoxazole-trimethoprim] Other (See Comments) 08/27/2012   Pb-hyoscy-atropine-scopolamine [phenobarbital-belladonna alk] Other (See Comments) 04/12/2021   Penicillins Hives 05/08/2007   Scopolamine Other (See Comments) 04/12/2021    Past Medical History:  Diagnosis Date   Abnormal vaginal Pap smear    Acute pharyngitis 02/25/2013   Anemia      Anxiety    Arthritis    BCC (basal cell carcinoma of skin) 10/05/2014   On back   Chicken pox as a child   Chronic UTI    sees dr peterson   Constipation 12/07/2015   Depression with anxiety 11/02/2009   Qualifier: Diagnosis of  By: Williams, LPN, Bonnye M    Dermatitis 07/17/2012   Esophageal stricture 1994   Fibroids    Foot pain, bilateral 06/19/2012   GERD  (gastroesophageal reflux disease)    Hiatal hernia    Hyperglycemia 08/19/2013   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   Hyperlipidemia    Hypertension    Infertility, female    Low back pain 10/17/2007   Qualifier: Diagnosis of  By: Jenkins MD, John E    Measles as a child   Obesity    Osteoarthritis    Parkinson disease (HCC)    Plantar fasciitis of left foot 06/19/2012   Preventative health care 12/19/2015   Rheumatoid arthritis (HCC) 01/08/2018   Rosacea 10/05/2014   Swallowing difficulty    Urinary frequency 02/25/2013   Visual floaters 05/11/2014    Past Surgical History:  Procedure Laterality Date   ABDOMINAL HYSTERECTOMY  2006   total   esophageal     stretching   HERNIA REPAIR N/A    laporoscopy     LEFT HEART CATH AND CORONARY ANGIOGRAPHY N/A 06/02/2019   Procedure: LEFT HEART CATH AND CORONARY ANGIOGRAPHY;  Surgeon: Varanasi, Jayadeep S, MD;  Location: [facility] INVASIVE CV LAB;  Service: Cardiovascular;  Laterality: N/A;   TONSILLECTOMY     TOTAL HIP ARTHROPLASTY Right 2010   wisdom teeth extracted      Family History  Problem Relation Age of Onset   Other Mother        arrythmia   Mental illness Mother        bipolar   Hyperlipidemia Mother    Thyroid disease Mother    Depression Mother    Bipolar disorder Mother    Breast cancer Mother 64   Heart disease Father    Arthritis Father        rheumatoid   Hypertension Father    Hyperlipidemia Father    Depression Sister    Mental illness Sister        bipolar   Parkinson's disease Sister    Arthritis Sister    Arthritis Sister    Arthritis Sister    Breast cancer Maternal Aunt 35   Breast cancer Maternal Uncle        70s   Heart attack Paternal Uncle    Heart attack Paternal Grandfather    Breast cancer Cousin 65   Colon cancer Neg Hx    Esophageal cancer Neg Hx    Rectal cancer Neg Hx    Stomach cancer Neg Hx     Social History   Socioeconomic History   Marital status: Married    Spouse name:  Not on file   Number of children: 1   Years of education: Not on file   Highest education level: Master's degree (e.g., MA, MS, MEng, MEd, MSW, MBA)  Occupational History   Occupation: Retired Admin instructor  Tobacco Use   Smoking status: Never   Smokeless tobacco: Never  Vaping Use   Vaping Use: Never used  Substance and Sexual Activity   Alcohol use: Yes    Comment: occasional wine   Drug use: No   Sexual activity: Not Currently    Partners: Male  Other Topics   Concern   Not on file  Social History Narrative   Lives with husband, 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 3 grandsons- 2 in [place] and 8 year old in [place] in 2023      Hobbies: book club, time with friends, time with couples friends, plays pianos   Social Determinants of Health   Financial Resource Strain: Low Risk  (11/03/2021)   Overall Financial Resource Strain (CARDIA)    Difficulty of Paying Living Expenses: Not hard at all  Food Insecurity: No Food Insecurity (11/03/2021)   Hunger Vital Sign    Worried About Running Out of Food in the Last Year: Never true    Ran Out of Food in the Last Year: Never true  Transportation Needs: No Transportation Needs (11/03/2021)   PRAPARE - Transportation    Lack of Transportation (Medical): No    Lack of Transportation (Non-Medical): No  Physical Activity: Sufficiently Active (12/07/2021)   Exercise Vital Sign    Days of Exercise per Week: 5 days    Minutes of Exercise per Session: 60 min  Stress: No Stress Concern Present (11/03/2021)   [employer] - Occupational Stress Questionnaire    Feeling of Stress : Not at all  Social Connections: Moderately Integrated (11/03/2021)   Social Connection and Isolation Panel [NHANES]    Frequency of Communication with Friends and Family: More than three times a week    Frequency of Social Gatherings with Friends  and Family: More than three times a week    Attends Religious Services: Never    Active Member of Clubs or Organizations: Yes    Attends Club or Organization Meetings: More than 4 times per year    Marital Status: Married  Intimate Partner Violence: Not At Risk (11/03/2021)   Humiliation, Afraid, Rape, and Kick questionnaire    Fear of Current or Ex-Partner: No    Emotionally Abused: No    Physically Abused: No    Sexually Abused: No      Review of systems: All other review of systems negative except as mentioned in the HPI.   Physical Exam: Vitals:   04/25/22 1507  BP: 118/68  Pulse: 76   Body mass index is 42.23 kg/m. Gen:      No acute distress HEENT:  sclera anicteric Abd:      soft, non-tender; no palpable masses, no distension Ext:    No edema Neuro: alert and oriented x 3 Psych: normal mood and affect  Data Reviewed:  Reviewed labs, radiology imaging, old records and pertinent past GI work up   Assessment and Plan/Recommendations:  71-year-old very pleasant female with history of Parkinson's disease, rheumatoid arthritis with chronic idiopathic constipation, abdominal bloating and excess gas   Chronic idiopathic constipation and dyssynergic defecation:  Continue Linzess 145 mcg daily Increase dietary fiber and water intake  Return in 6 to 12 months or sooner if needed  This visit required 40 minutes of patient care (this includes precharting, chart review, review of results, face-to-face time used for counseling as well as treatment plan and follow-up. The patient was provided an opportunity to ask questions and all were answered. The patient agreed with the plan and demonstrated an understanding of the instructions.  K. Veena Nandigam , MD    CC: Blyth, Stacey A, MD   

## 2022-04-25 NOTE — Patient Instructions (Signed)
We have sent the following medications to your pharmacy for you to pick up at your convenience: Linzess 145 mcg daily.  Please follow up with Dr. Silverio Decamp in 3 months.   _______________________________________________________  If you are age 72 or older, your body mass index should be between 23-30. Your Body mass index is 42.23 kg/m. If this is out of the aforementioned range listed, please consider follow up with your Primary Care Provider.  If you are age 49 or younger, your body mass index should be between 19-25. Your Body mass index is 42.23 kg/m. If this is out of the aformentioned range listed, please consider follow up with your Primary Care Provider.   ________________________________________________________  The [facility] providers would like to encourage you to use [facility] to communicate with providers for non-urgent requests or questions.  Due to long hold times on the telephone, sending your provider a message by [facility] may be a faster and more efficient way to get a response.  Please allow 48 business hours for a response.  Please remember that this is for non-urgent requests.  _______________________________________________________

## 2022-05-07 ENCOUNTER — Other Ambulatory Visit: Payer: Self-pay | Admitting: Family Medicine

## 2022-05-15 ENCOUNTER — Encounter: Payer: Self-pay | Admitting: Physical Therapy

## 2022-05-15 ENCOUNTER — Other Ambulatory Visit: Payer: Self-pay

## 2022-05-15 ENCOUNTER — Ambulatory Visit: Payer: Medicare Other | Attending: Neurology | Admitting: Physical Therapy

## 2022-05-15 DIAGNOSIS — R2689 Other abnormalities of gait and mobility: Secondary | ICD-10-CM | POA: Insufficient documentation

## 2022-05-15 DIAGNOSIS — G20 Parkinson's disease: Secondary | ICD-10-CM | POA: Diagnosis not present

## 2022-05-15 DIAGNOSIS — M6281 Muscle weakness (generalized): Secondary | ICD-10-CM | POA: Diagnosis not present

## 2022-05-15 DIAGNOSIS — R2681 Unsteadiness on feet: Secondary | ICD-10-CM | POA: Diagnosis not present

## 2022-05-15 DIAGNOSIS — R29818 Other symptoms and signs involving the nervous system: Secondary | ICD-10-CM | POA: Diagnosis not present

## 2022-05-15 NOTE — Therapy (Signed)
OUTPATIENT PHYSICAL THERAPY NEURO EVALUATION   Patient Name: Kristin [name] MRN: [id number] DOB:01/17/1951, 71 y.o., female Today's Date: 05/15/2022   PCP: Brayton Mars. Yong Channel, MD REFERRING PROVIDER: Alonza Bogus, DO   PT End of Session - 05/15/22 1107     Visit Number 1    Number of Visits 9    Date for PT Re-Evaluation 07/07/22    Authorization Type Medicare/[employer]    PT Start Time 1104    PT Stop Time 1146    PT Time Calculation (min) 42 min    Activity Tolerance Patient tolerated treatment well    Behavior During Therapy [facility] for tasks assessed/performed             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   Hyperlipidemia    Hypertension    Infertility, female    Low back pain 10/17/2007   Qualifier: Diagnosis of  By: [name], [name]    Measles as a child   Obesity    Osteoarthritis    Parkinson disease ([place])    Plantar fasciitis of left foot 06/19/2012   Preventative health care 12/19/2015   Rheumatoid arthritis ([place]) 01/08/2018   Rosacea 10/05/2014   Swallowing difficulty    Urinary frequency 02/25/2013   Visual floaters 05/11/2014   Past Surgical History:  Procedure Laterality Date   ABDOMINAL HYSTERECTOMY  2006   total   esophageal     stretching   HERNIA REPAIR N/A    laporoscopy     LEFT HEART CATH AND CORONARY ANGIOGRAPHY N/A 06/02/2019   Procedure: LEFT HEART CATH AND CORONARY  ANGIOGRAPHY;  Surgeon: Jettie Booze, MD;  Location: [place] [facility];  Service: Cardiovascular;  Laterality: N/A;   TONSILLECTOMY     TOTAL HIP ARTHROPLASTY Right 2010   wisdom teeth extracted     Patient Active Problem List   Diagnosis Date Noted   Vitamin B 12 deficiency 05/30/2021   Anemia 03/16/2020   Coronary artery disease involving native coronary artery of native heart without angina pectoris 71/24/5809   Diastolic dysfunction 98/33/8250   Rheumatoid arthritis ([place]) 01/08/2018   Hand pain, right 10/15/2017   Vitamin D deficiency 12/20/2016   Prediabetes 12/20/2016   Shortness of breath on exertion 11/27/2016   Morbid obesity ([place])    Constipation 12/07/2015   BCC (basal cell carcinoma of skin) 10/05/2014   Rosacea 10/05/2014   Parkinson's disease ([place]) 05/11/2014   Screening for cervical cancer 07/17/2012   Foot pain, bilateral 06/19/2012   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   Depression with anxiety 11/02/2009   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   Lumbar back pain with radiculopathy affecting lower extremity 10/17/2007   Essential hypertension 07/05/2007   Osteoarthritis 07/05/2007   Hyperlipidemia 06/26/2007   H/O: iron deficiency anemia 05/08/2007   Hiatal hernia with  GERD 05/08/2007    ONSET DATE: 04/13/2022 (MD referral)  REFERRING DIAG: G20 (ICD-10-CM) - Parkinson's disease ([place])  THERAPY DIAG:  Unsteadiness on feet  Other abnormalities of gait and mobility  Muscle weakness (generalized)  Other symptoms and signs involving the nervous system  Rationale for Evaluation and Treatment Rehabilitation  SUBJECTIVE:                                                                                                                                                                                              SUBJECTIVE STATEMENT: Just went on trip to [place].  Was able to do things I didn't think I could do.  Saw Dr. Carles Collet in August. Pt  accompanied by: self  PERTINENT HISTORY: See above for PMH  PAIN:  Are you having pain? No and occasional pain in lateral L aspect of foot; has medication L medial knee pain 8/10 lifting up (knee/hip flexion).  Will monitor, as pt has just had a plane ride (24 hrs)  PRECAUTIONS: Fall  WEIGHT BEARING RESTRICTIONS No  FALLS: Has patient fallen in last 6 months? No  LIVING ENVIRONMENT: Lives with: lives with their family and lives with their spouse Lives in: House/apartment Stairs: Yes: Internal: 13 steps; on right going up Has following equipment at home:  bilateral walking poles  PLOF: Independent with household mobility without device, Independent with community mobility with device, and Leisure: Does community Parkinson's based exercise  PATIENT GOALS Want to make sure to work on walking  OBJECTIVE:   DIAGNOSTIC FINDINGS: NA  COGNITION: Overall cognitive status: Within functional limits for tasks assessed   POSTURE: rounded shoulders  LOWER EXTREMITY ROM:   A/ROM [facility]  Active  Right Eval Left Eval  Hip flexion    Hip extension    Hip abduction    Hip adduction    Hip internal rotation    Hip external rotation    Knee flexion    Knee extension    Ankle dorsiflexion    Ankle plantarflexion    Ankle inversion    Ankle eversion     (Blank rows = not tested)  LOWER EXTREMITY MMT:  Grossly tested at least 4/5  MMT Right Eval Left Eval  Hip flexion    Hip extension    Hip abduction    Hip adduction    Hip internal rotation    Hip external rotation    Knee flexion    Knee extension    Ankle dorsiflexion    Ankle plantarflexion    Ankle inversion    Ankle eversion    (Blank rows = not tested)  TRANSFERS: Assistive device utilized: None  Sit to stand: Modified independence Stand to sit: Modified independence  GAIT: Gait pattern: step through pattern and decreased stride length Distance walked: 50 ft Assistive device utilized:  single walking pole  and None Level of assistance: Modified independence Comments: Slightly slowed walking pace with no device versus using single walking pole  FUNCTIONAL TESTs:  5 times sit to stand: 13.56 sec Timed up and go (TUG): 13.91 sec Gait velocity:  12.12 sec (2.7 ft/sec) TUG cognitive:  14.94 sec MiniBESTest:  20/28  [facility] PT Assessment - 05/15/22 0001       Mini-BESTest   Sit To Stand Normal: Comes to stand without use of hands and stabilizes independently.    Rise to Toes < 3 s.   1.68   Stand on one leg (left) Moderate: < 20 s   8.06, 2.06   Stand on one leg (right) Moderate: < 20 s   0.97, 3.22   Stand on one leg - lowest score 1    Compensatory Stepping Correction - Forward Normal: Recovers independently with a single, large step (second realignement is allowed).    Compensatory Stepping Correction - Backward Normal: Recovers independently with a single, large step    Compensatory Stepping Correction - Left Lateral Normal: Recovers independently with 1 step (crossover or lateral OK)    Compensatory Stepping Correction - Right Lateral Normal: Recovers independently with 1 step (crossover or lateral OK)    Stepping Corredtion Lateral - lowest score 2    Stance - Feet together, eyes open, firm surface  Normal: 30s    Stance - Feet together, eyes closed, foam surface  Moderate: < 30s   9.56   Incline - Eyes Closed Normal: Stands independently 30s and aligns with gravity    Change in Gait Speed Moderate: Unable to change walking speed or signs of imbalance    Walk with head turns - Horizontal Moderate: performs head turns with reduction in gait speed.    Walk with pivot turns Moderate:Turns with feet close SLOW (>4 steps) with good balance.    Step over obstacles Moderate: Steps over box but touches box OR displays cautious behavior by slowing gait.    Timed UP & GO with Dual Task Normal: No noticeable change in sitting, standing or walking while backward counting when compared to TUG without     Mini-BEST total score 20               TODAY'S TREATMENT:  Initiated HEP-see below   PATIENT EDUCATION: Education details: Eval results, POC, initiated HEP Person educated: Patient Education method: Explanation, Demonstration, and Handouts Education comprehension: verbalized understanding, returned demonstration, and verbal cues required   HOME EXERCISE PROGRAM: Access Code: [email or web address] URL: [email or web address] Date: 05/15/2022 Prepared by: [facility]  Exercises - Standing on Foam Pad  - 1 x daily - 5 x weekly - 1 sets - 3 reps - 15 sec hold - Heel Raises with Counter Support  - 1 x daily - 5 x weekly - 1-2 sets - 10 reps - 3 sec hold    GOALS: Goals reviewed with patient? Yes  SHORT TERM GOALS: Target date: 06/09/2022  Pt will be independent with HEP for improved strength, transfers, balance. Baseline: Goal status: INITIAL  2.  Pt will improve 5x sit<>stand to less than or equal to 12.5 sec to demonstrate improved functional strength and transfer efficiency. Baseline: 13.56 sec Goal status: INITIAL  LONG TERM  GOALS: Target date: 07/07/2022  Pt will be independent with HEP for improved strength, transfers, balance. Baseline:  Goal status: INITIAL  2.  Pt will improve TUG score to less than or equal to 13 sec for decreased fall risk. Baseline: 13.91 sec Goal status: INITIAL  3.  Pt will improve MiniBESTest score to at least 22/28 to decrease fall risk. Baseline: 20/28 Goal status: INITIAL  4.  Pt will verbalize plans for walking aspect of fitness upon d/c from PT. Baseline:  Goal status: INITIAL  ASSESSMENT:  CLINICAL IMPRESSION: Patient is a 71 y.o. female who was seen today for physical therapy evaluation and treatment for Parkinson's disease. She presents to OPPT following visit with Dr. Carles Collet, who reported movements more tenuous at their visit in mid-August.  Compared to measures from eval earlier  this year, pt does demonstrates slowed mobility with 5x sit<>stand test, TUG measure and decreased balance on MiniBESTest.  She is at increased fall risk per these measures.  She has not had fall in past 6 months; she does participate in community Parkinson's specific fitness activities.  She does endorse slowed, cautious movement patterns at times.  She will benefit from skilled PT to address the deficits noted below to decrease fall risk and improve overall functional mobility.   OBJECTIVE IMPAIRMENTS Abnormal gait, decreased balance, decreased mobility, difficulty walking, decreased strength, and bradykinesia, dyskinesia (R foot) .   ACTIVITY LIMITATIONS standing and locomotion level  PARTICIPATION LIMITATIONS: community activity  PERSONAL FACTORS 3+ comorbidities: see above for PMH  are also affecting patient's functional outcome.   REHAB POTENTIAL: Good  CLINICAL DECISION MAKING: Stable/uncomplicated  EVALUATION COMPLEXITY: Low  PLAN: PT FREQUENCY:  2x/wk for 1 week, then 1x/wk for 7 weeks  PT DURATION: 8 weeks including eval week  PLANNED INTERVENTIONS: Therapeutic exercises, Therapeutic activity, Neuromuscular re-education, Balance training, Gait training, Patient/Family education, Self Care, Stair training, DME instructions, and Manual therapy  PLAN FOR NEXT SESSION: Review initial HEP and add to HEP for functional strength, balance, walking program   Anden Bartolo W., PT 05/15/2022, 1:39 PM  [facility] at [facility] [address] [place], [place] [address] Phone # [phone] Fax # [phone]

## 2022-05-17 ENCOUNTER — Encounter: Payer: Self-pay | Admitting: Physical Therapy

## 2022-05-17 ENCOUNTER — Ambulatory Visit: Payer: Medicare Other | Admitting: Physical Therapy

## 2022-05-17 DIAGNOSIS — G20 Parkinson's disease: Secondary | ICD-10-CM | POA: Diagnosis not present

## 2022-05-17 DIAGNOSIS — R29818 Other symptoms and signs involving the nervous system: Secondary | ICD-10-CM | POA: Diagnosis not present

## 2022-05-17 DIAGNOSIS — R2689 Other abnormalities of gait and mobility: Secondary | ICD-10-CM | POA: Diagnosis not present

## 2022-05-17 DIAGNOSIS — M6281 Muscle weakness (generalized): Secondary | ICD-10-CM

## 2022-05-17 DIAGNOSIS — R2681 Unsteadiness on feet: Secondary | ICD-10-CM

## 2022-05-17 NOTE — Therapy (Signed)
OUTPATIENT PHYSICAL THERAPY TREATMENT NOTE   Patient Name: Kristin [name] MRN: [id number] DOB:19-Nov-1950, 71 y.o., female Today's Date: 05/17/2022  PCP: Brayton Mars. Yong Channel, MD REFERRING PROVIDER:  Alonza Bogus, DO  END OF SESSION:   PT End of Session - 05/17/22 0806     Visit Number 2    Number of Visits 9    Date for PT Re-Evaluation 07/07/22    Authorization Type Medicare/[employer]    PT Start Time 0803    PT Stop Time 0844    PT Time Calculation (min) 41 min    Activity Tolerance Patient tolerated treatment well    Behavior During Therapy [facility] for tasks assessed/performed             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   Hyperlipidemia    Hypertension    Infertility, female    Low back pain 10/17/2007   Qualifier: Diagnosis of  By: [name], [name]    Measles as a child   Obesity    Osteoarthritis    Parkinson disease ([place])    Plantar fasciitis of left foot 06/19/2012   Preventative health care 12/19/2015   Rheumatoid arthritis ([place]) 01/08/2018   Rosacea 10/05/2014   Swallowing difficulty    Urinary frequency 02/25/2013   Visual floaters 05/11/2014   Past Surgical History:  Procedure Laterality Date   ABDOMINAL HYSTERECTOMY  2006   total   esophageal     stretching   HERNIA REPAIR N/A    laporoscopy     LEFT HEART CATH AND CORONARY ANGIOGRAPHY N/A 06/02/2019   Procedure: LEFT HEART CATH AND  CORONARY ANGIOGRAPHY;  Surgeon: Jettie Booze, MD;  Location: [place] [facility];  Service: Cardiovascular;  Laterality: N/A;   TONSILLECTOMY     TOTAL HIP ARTHROPLASTY Right 2010   wisdom teeth extracted     Patient Active Problem List   Diagnosis Date Noted   Vitamin B 12 deficiency 05/30/2021   Anemia 03/16/2020   Coronary artery disease involving native coronary artery of native heart without angina pectoris 69/62/9528   Diastolic dysfunction 41/32/4401   Rheumatoid arthritis ([place]) 01/08/2018   Hand pain, right 10/15/2017   Vitamin D deficiency 12/20/2016   Prediabetes 12/20/2016   Shortness of breath on exertion 11/27/2016   Morbid obesity ([place])    Constipation 12/07/2015   BCC (basal cell carcinoma of skin) 10/05/2014   Rosacea 10/05/2014   Parkinson's disease (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   Depression with anxiety 11/02/2009   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   Lumbar back pain with radiculopathy affecting lower extremity 10/17/2007   Essential hypertension 07/05/2007   Osteoarthritis 07/05/2007   Hyperlipidemia 06/26/2007   H/O: iron deficiency anemia 05/08/2007  Hiatal hernia with GERD 05/08/2007    REFERRING DIAG: G20 (ICD-10-CM) - Parkinson's disease ([place])  THERAPY DIAG:  Unsteadiness on feet  Other abnormalities of gait and mobility  Muscle weakness (generalized)  Rationale for Evaluation and Treatment Rehabilitation  PERTINENT HISTORY: See above for PMH  PRECAUTIONS: Fall  SUBJECTIVE: Feel like when I do the exercises on my toes, that there is a block and my foot/leg feels weak.  PAIN:  Are you having pain? Yes: NPRS scale: 3/10 Pain location: R hamstrings Pain description: sore, tightness Aggravating factors: when going up to stand Relieving factors: sitting, stretching is okay   OBJECTIVE:    TODAY'S TREATMENT:  05/17/2022 Activity Comments  Seated lower extremity warm up:  marching in place, knee flexion/extension with feet on bolster, then seated hamstring stretch, foot propped on aerobic block, 3 x 30 seconds each leg   Stagger stance forward/back rocking, 2 sets x 10 reps Mirror cues for keeping neutral position through R foot and ankle  Stagger stance forward/back with step/shift, 10 reps for improved SLS   SLS with foot taps, then into runner's stretch position, 10 reps each foot; then SLS with hip/knee flexion>runner's stretch position x 10 reps each foot Additional reps initially to get correct sequence  Standing on compliant surface:  feet apart/feet together EO and EC, 30 sec, then with feet together EO head turns/nods Intermittent UE support, mild/moderate sway with EC  Short distance gait with single walking pole, cues for upright posture, use of visual cues; practiced "PWR! Up position" to reset posture with gait Responds well to cues  Access Code: [email or web address] URL: [email or web address] Date: 05/17/2022-most recent update Prepared by: [facility]  Exercises - Standing on Foam Pad  - 1 x daily - 5 x weekly - 1 sets - 3 reps - 15 sec hold - Seated Hamstring Stretch  - 2 x daily - 7 x weekly - 1 sets - 3 reps - Stride Stance Weight Shift  - 1-2 x daily - 7 x weekly - 1-2 sets - 10 reps  ------------------------------------------------------------------------------------- (objective measures completed at initial evaluation unless otherwise dated)   DIAGNOSTIC FINDINGS: NA   COGNITION: Overall cognitive status: Within functional limits for tasks assessed             POSTURE: rounded shoulders   LOWER EXTREMITY ROM:   A/ROM [facility]   Active  Right Eval Left Eval  Hip flexion      Hip extension      Hip abduction      Hip adduction      Hip internal rotation      Hip external rotation      Knee flexion      Knee extension      Ankle  dorsiflexion      Ankle plantarflexion      Ankle inversion      Ankle eversion       (Blank rows = not tested)   LOWER EXTREMITY MMT:  Grossly tested at least 4/5   MMT Right Eval Left Eval  Hip flexion      Hip extension      Hip abduction      Hip adduction      Hip internal rotation      Hip external rotation      Knee flexion      Knee extension      Ankle dorsiflexion      Ankle plantarflexion  Ankle inversion      Ankle eversion      (Blank rows = not tested)     TRANSFERS: Assistive device utilized: None  Sit to stand: Modified independence Stand to sit: Modified independence   GAIT: Gait pattern: step through pattern and decreased stride length Distance walked: 50 ft Assistive device utilized:  single walking pole and None Level of assistance: Modified independence Comments: Slightly slowed walking pace with no device versus using single walking pole   FUNCTIONAL TESTs:  5 times sit to stand: 13.56 sec Timed up and go (TUG): 13.91 sec Gait velocity:  12.12 sec (2.7 ft/sec) TUG cognitive:  14.94 sec MiniBESTest:  20/28   [facility] PT Assessment - 05/15/22 0001                Mini-BESTest    Sit To Stand Normal: Comes to stand without use of hands and stabilizes independently.     Rise to Toes < 3 s.   1.68    Stand on one leg (left) Moderate: < 20 s   8.06, 2.06    Stand on one leg (right) Moderate: < 20 s   0.97, 3.22    Stand on one leg - lowest score 1     Compensatory Stepping Correction - Forward Normal: Recovers independently with a single, large step (second realignement is allowed).     Compensatory Stepping Correction - Backward Normal: Recovers independently with a single, large step     Compensatory Stepping Correction - Left Lateral Normal: Recovers independently with 1 step (crossover or lateral OK)     Compensatory Stepping Correction - Right Lateral Normal: Recovers independently with 1 step (crossover or lateral OK)     Stepping  Corredtion Lateral - lowest score 2     Stance - Feet together, eyes open, firm surface  Normal: 30s     Stance - Feet together, eyes closed, foam surface  Moderate: < 30s   9.56    Incline - Eyes Closed Normal: Stands independently 30s and aligns with gravity     Change in Gait Speed Moderate: Unable to change walking speed or signs of imbalance     Walk with head turns - Horizontal Moderate: performs head turns with reduction in gait speed.     Walk with pivot turns Moderate:Turns with feet close SLOW (>4 steps) with good balance.     Step over obstacles Moderate: Steps over box but touches box OR displays cautious behavior by slowing gait.     Timed UP & GO with Dual Task Normal: No noticeable change in sitting, standing or walking while backward counting when compared to TUG without     Mini-BEST total score 20                       TODAY'S TREATMENT:  Initiated HEP-see below     PATIENT EDUCATION: Education details: Eval results, POC, initiated HEP Person educated: Patient Education method: Explanation, Demonstration, and Handouts Education comprehension: verbalized understanding, returned demonstration, and verbal cues required     HOME EXERCISE PROGRAM: Access Code: [email or web address] URL: [email or web address] Date: 05/15/2022 Prepared by: [facility]   Exercises - Standing on Foam Pad  - 1 x daily - 5 x weekly - 1 sets - 3 reps - 15 sec hold - Heel Raises with Counter Support  - 1 x daily - 5 x weekly - 1-2 sets - 10 reps - 3 sec  hold       GOALS: Goals reviewed with patient? Yes   SHORT TERM GOALS: Target date: 06/09/2022   Pt will be independent with HEP for improved strength, transfers, balance. Baseline: Goal status: INITIAL   2.  Pt will improve 5x sit<>stand to less than or equal to 12.5 sec to demonstrate improved functional strength and transfer efficiency. Baseline: 13.56 sec Goal status: INITIAL   LONG  TERM GOALS: Target date: 07/07/2022   Pt will be independent with HEP for improved strength, transfers, balance. Baseline:  Goal status: INITIAL   2.  Pt will improve TUG score to less than or equal to 13 sec for decreased fall risk. Baseline: 13.91 sec Goal status: INITIAL   3.  Pt will improve MiniBESTest score to at least 22/28 to decrease fall risk. Baseline: 20/28 Goal status: INITIAL   4.  Pt will verbalize plans for walking aspect of fitness upon d/c from PT. Baseline:  Goal status: INITIAL   ASSESSMENT:   CLINICAL IMPRESSION: Skilled PT session today focused on progressing HEP and working on single limb stance.  Adjusted HEP for patient this visit to help with more neutral plantarflexion exercises.  Pt responds well to visual cues of mirror and visual cues for target with improved posture with gait.    OBJECTIVE IMPAIRMENTS Abnormal gait, decreased balance, decreased mobility, difficulty walking, decreased strength, and bradykinesia, dyskinesia (R foot) .    ACTIVITY LIMITATIONS standing and locomotion level   PARTICIPATION LIMITATIONS: community activity   PERSONAL FACTORS 3+ comorbidities: see above for PMH  are also affecting patient's functional outcome.    REHAB POTENTIAL: Good   CLINICAL DECISION MAKING: Stable/uncomplicated   EVALUATION COMPLEXITY: Low   PLAN: PT FREQUENCY:  2x/wk for 1 week, then 1x/wk for 7 weeks   PT DURATION: 8 weeks including eval week   PLANNED INTERVENTIONS: Therapeutic exercises, Therapeutic activity, Neuromuscular re-education, Balance training, Gait training, Patient/Family education, Self Care, Stair training, DME instructions, and Manual therapy   PLAN FOR NEXT SESSION: Review HEP additions; ask about posture/walking pole and consider lowering pole slightly and add to HEP for functional strength, balance, walking program-work on SLS     Stepen Prins W., PT 05/17/2022, 8:51 AM  [place] at [facility] [address], [place], [place] [address] Phone # [phone] Fax # [phone]

## 2022-05-18 ENCOUNTER — Ambulatory Visit: Payer: Medicare Other | Admitting: Occupational Therapy

## 2022-05-21 ENCOUNTER — Other Ambulatory Visit: Payer: Self-pay | Admitting: Family Medicine

## 2022-05-25 ENCOUNTER — Encounter: Payer: Self-pay | Admitting: Physical Therapy

## 2022-05-25 ENCOUNTER — Ambulatory Visit: Payer: Medicare Other | Admitting: Physical Therapy

## 2022-05-25 DIAGNOSIS — M6281 Muscle weakness (generalized): Secondary | ICD-10-CM

## 2022-05-25 DIAGNOSIS — R29818 Other symptoms and signs involving the nervous system: Secondary | ICD-10-CM | POA: Diagnosis not present

## 2022-05-25 DIAGNOSIS — R2689 Other abnormalities of gait and mobility: Secondary | ICD-10-CM

## 2022-05-25 DIAGNOSIS — R2681 Unsteadiness on feet: Secondary | ICD-10-CM | POA: Diagnosis not present

## 2022-05-25 DIAGNOSIS — G20 Parkinson's disease: Secondary | ICD-10-CM | POA: Diagnosis not present

## 2022-05-25 NOTE — Therapy (Signed)
OUTPATIENT PHYSICAL THERAPY TREATMENT NOTE   Patient Name: Kristin [name] MRN: [id number] DOB:1951/05/10, 71 y.o., female Today's Date: 05/25/2022  PCP: Brayton Mars. Yong Channel, MD REFERRING PROVIDER:  Alonza Bogus, DO  END OF SESSION:   PT End of Session - 05/25/22 1003     Visit Number 3    Number of Visits 9    Date for PT Re-Evaluation 07/07/22    Authorization Type Medicare/[employer]    PT Start Time 1012    PT Stop Time 1055    PT Time Calculation (min) 43 min    Activity Tolerance Patient tolerated treatment well    Behavior During Therapy WFL for tasks assessed/performed              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   Hyperlipidemia    Hypertension    Infertility, female    Low back pain 10/17/2007   Qualifier: Diagnosis of  By: [name], [name]    Measles as a child   Obesity    Osteoarthritis    Parkinson disease ([place])    Plantar fasciitis of left foot 06/19/2012   Preventative health care 12/19/2015   Rheumatoid arthritis ([place]) 01/08/2018   Rosacea 10/05/2014   Swallowing difficulty    Urinary frequency 02/25/2013   Visual floaters 05/11/2014   Past Surgical History:  Procedure Laterality Date   ABDOMINAL HYSTERECTOMY  2006   total   esophageal     stretching   HERNIA REPAIR N/A    laporoscopy     LEFT HEART CATH AND CORONARY ANGIOGRAPHY N/A 06/02/2019   Procedure: LEFT HEART CATH  AND CORONARY ANGIOGRAPHY;  Surgeon: Jettie Booze, MD;  Location: [facility];  Service: Cardiovascular;  Laterality: N/A;   TONSILLECTOMY     TOTAL HIP ARTHROPLASTY Right 2010   wisdom teeth extracted     Patient Active Problem List   Diagnosis Date Noted   Vitamin B 12 deficiency 05/30/2021   Anemia 03/16/2020   Coronary artery disease involving native coronary artery of native heart without angina pectoris 61/60/7371   Diastolic dysfunction 02/28/9484   Rheumatoid arthritis ([place]) 01/08/2018   Hand pain, right 10/15/2017   Vitamin D deficiency 12/20/2016   Prediabetes 12/20/2016   Shortness of breath on exertion 11/27/2016   Morbid obesity ([place])    Constipation 12/07/2015   BCC (basal cell carcinoma of skin) 10/05/2014   Rosacea 10/05/2014   Parkinson's disease ([place]) 05/11/2014   Screening for cervical cancer 07/17/2012   Foot pain, bilateral 06/19/2012   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   Depression with anxiety 11/02/2009   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   Lumbar back pain with radiculopathy affecting lower extremity 10/17/2007   Essential hypertension 07/05/2007   Osteoarthritis 07/05/2007   Hyperlipidemia 06/26/2007   H/O: iron deficiency anemia 05/08/2007  Hiatal hernia with GERD 05/08/2007    REFERRING DIAG: G20 (ICD-10-CM) - Parkinson's disease ([place])  THERAPY DIAG:  Unsteadiness on feet  Other abnormalities of gait and mobility  Muscle weakness (generalized)  Rationale for Evaluation and Treatment Rehabilitation  PERTINENT HISTORY: See above for PMH  PRECAUTIONS: Fall  SUBJECTIVE: Went to [place] this morning to get back in with training with him.  Tightness in hamstring comes and goes-stretches help  PAIN:  Are you having pain? Yes: NPRS scale: 4/10 Pain location: R hamstrings Pain description: sore, tightness Aggravating factors: when going up to stand Relieving factors: sitting, stretching is okay   OBJECTIVE:     TODAY'S TREATMENT: 05/25/2022 Activity Comments  NuStep, Level 5, 4 extremities, x 8 minutes Cues to keep SPM>90 for increased intensity of warm-up activity  Reviewed HEP last visit, with pt return demo understanding   Wide BOS anterior/posterior weightshift 5 reps, for hamstring stretch   Sit<>stand 5 reps, 2 sets Cues for full knee extension upon standing  SLS with foot taps, then into runner's stretch position, 10 reps each foot; then SLS with hip/knee flexion>runner's stretch position 2 x 10 reps each foot Cues for improved timing, coordination with RLE  Standing on Airex:  alternating step taps to 6" step, 12" step with 1-2 UE support   Standing on Airex:   Forward>back step and weightshift Side step and weigthshift feet apart/feet together EO and EC, 30 sec, then with feet together EO head turns/nods Light UE support for EC positions  Gait with clinic walking pole (top 40", bottom 39'") on outdoor surfaces, 600 ft, mod independently Cues for upright posture and sequenceing with slightly lowered pole compared to the height she is used to.  Pt needs initial cues and improves sequencing with repetition.    PATIENT EDUCATION: Education details: Lowering her walking pole slightly to allow for improved reciprocal sequencing with RLE for improved posture and step length Person educated: Patient Education method: Explanation, Demonstration, and Verbal cues Education comprehension: verbalized understanding, returned demonstration, and needs further education   Access Code: [email or web address] URL: [email or web address] Date: 05/17/2022-most recent update Prepared by: [facility]  Exercises - Standing on Foam Pad  - 1 x daily - 5 x weekly - 1 sets - 3 reps - 15 sec hold - Seated Hamstring Stretch  - 2 x daily - 7 x weekly - 1 sets - 3 reps - Stride Stance Weight Shift  - 1-2 x daily - 7 x weekly - 1-2 sets - 10  reps  ------------------------------------------------------------------------------------- (objective measures completed at initial evaluation unless otherwise dated)   DIAGNOSTIC FINDINGS: NA   COGNITION: Overall cognitive status: Within functional limits for tasks assessed             POSTURE: rounded shoulders   LOWER EXTREMITY ROM:   A/ROM [facility]   Active  Right Eval Left Eval  Hip flexion      Hip extension      Hip abduction      Hip adduction      Hip internal rotation      Hip external rotation      Knee flexion      Knee extension      Ankle dorsiflexion      Ankle plantarflexion      Ankle inversion      Ankle eversion       (Blank rows = not tested)   LOWER EXTREMITY MMT:  Grossly tested at  least 4/5   MMT Right Eval Left Eval  Hip flexion      Hip extension      Hip abduction      Hip adduction      Hip internal rotation      Hip external rotation      Knee flexion      Knee extension      Ankle dorsiflexion      Ankle plantarflexion      Ankle inversion      Ankle eversion      (Blank rows = not tested)     TRANSFERS: Assistive device utilized: None  Sit to stand: Modified independence Stand to sit: Modified independence   GAIT: Gait pattern: step through pattern and decreased stride length Distance walked: 50 ft Assistive device utilized:  single walking pole and None Level of assistance: Modified independence Comments: Slightly slowed walking pace with no device versus using single walking pole   FUNCTIONAL TESTs:  5 times sit to stand: 13.56 sec Timed up and go (TUG): 13.91 sec Gait velocity:  12.12 sec (2.7 ft/sec) TUG cognitive:  14.94 sec MiniBESTest:  20/28   [facility] PT Assessment - 05/15/22 0001                Mini-BESTest    Sit To Stand Normal: Comes to stand without use of hands and stabilizes independently.     Rise to Toes < 3 s.   1.68    Stand on one leg (left) Moderate: < 20 s   8.06, 2.06    Stand on one leg  (right) Moderate: < 20 s   0.97, 3.22    Stand on one leg - lowest score 1     Compensatory Stepping Correction - Forward Normal: Recovers independently with a single, large step (second realignement is allowed).     Compensatory Stepping Correction - Backward Normal: Recovers independently with a single, large step     Compensatory Stepping Correction - Left Lateral Normal: Recovers independently with 1 step (crossover or lateral OK)     Compensatory Stepping Correction - Right Lateral Normal: Recovers independently with 1 step (crossover or lateral OK)     Stepping Corredtion Lateral - lowest score 2     Stance - Feet together, eyes open, firm surface  Normal: 30s     Stance - Feet together, eyes closed, foam surface  Moderate: < 30s   9.56    Incline - Eyes Closed Normal: Stands independently 30s and aligns with gravity     Change in Gait Speed Moderate: Unable to change walking speed or signs of imbalance     Walk with head turns - Horizontal Moderate: performs head turns with reduction in gait speed.     Walk with pivot turns Moderate:Turns with feet close SLOW (>4 steps) with good balance.     Step over obstacles Moderate: Steps over box but touches box OR displays cautious behavior by slowing gait.     Timed UP & GO with Dual Task Normal: No noticeable change in sitting, standing or walking while backward counting when compared to TUG without     Mini-BEST total score 20                       TODAY'S TREATMENT:  Initiated HEP-see below     PATIENT EDUCATION: Education details: Eval results, POC, initiated HEP Person educated: Patient Education method: Explanation, Demonstration, and Handouts Education comprehension: verbalized understanding,  returned demonstration, and verbal cues required     HOME EXERCISE PROGRAM: Access Code: [email or web address] URL: [email or web address] Date: 05/15/2022 Prepared by: [facility]    Exercises - Standing on Foam Pad  - 1 x daily - 5 x weekly - 1 sets - 3 reps - 15 sec hold - Heel Raises with Counter Support  - 1 x daily - 5 x weekly - 1-2 sets - 10 reps - 3 sec hold       GOALS: Goals reviewed with patient? Yes   SHORT TERM GOALS: Target date: 06/09/2022   Pt will be independent with HEP for improved strength, transfers, balance. Baseline: Goal status: INITIAL   2.  Pt will improve 5x sit<>stand to less than or equal to 12.5 sec to demonstrate improved functional strength and transfer efficiency. Baseline: 13.56 sec Goal status: INITIAL   LONG TERM GOALS: Target date: 07/07/2022   Pt will be independent with HEP for improved strength, transfers, balance. Baseline:  Goal status: INITIAL   2.  Pt will improve TUG score to less than or equal to 13 sec for decreased fall risk. Baseline: 13.91 sec Goal status: INITIAL   3.  Pt will improve MiniBESTest score to at least 22/28 to decrease fall risk. Baseline: 20/28 Goal status: INITIAL   4.  Pt will verbalize plans for walking aspect of fitness upon d/c from PT. Baseline:  Goal status: INITIAL   ASSESSMENT:   CLINICAL IMPRESSION: Skilled PT session today reviewed exercises for flexibility for hamstrings and continued education on strategies to use exercises/stretches at various times through the day to lessen hamstring tightness and discomfort.  Focused also on SLS and compliant surfaces, with pt needing minimal UE support for improved stability.  Gait training with clinic walking pole, adjusted to slightly lower height than patient's pole (she didn't have hers today), with pt demonstrating improved posture, improved step length with slightly lower pole.  She improves with repetition of this activity and will benefit from continued practice, to improved confidence with longer distance gait.     OBJECTIVE IMPAIRMENTS Abnormal gait, decreased balance, decreased mobility, difficulty walking, decreased strength,  and bradykinesia, dyskinesia (R foot) .    ACTIVITY LIMITATIONS standing and locomotion level   PARTICIPATION LIMITATIONS: community activity   PERSONAL FACTORS 3+ comorbidities: see above for PMH  are also affecting patient's functional outcome.    REHAB POTENTIAL: Good   CLINICAL DECISION MAKING: Stable/uncomplicated   EVALUATION COMPLEXITY: Low   PLAN: PT FREQUENCY:  2x/wk for 1 week, then 1x/wk for 7 weeks   PT DURATION: 8 weeks including eval week   PLANNED INTERVENTIONS: Therapeutic exercises, Therapeutic activity, Neuromuscular re-education, Balance training, Gait training, Patient/Family education, Self Care, Stair training, DME instructions, and Manual therapy   PLAN FOR NEXT SESSION: ask about posture/walking pole and lower her walking pole slightly; add to HEP for functional strength, balance, walking program-work on SLS     Tyriek Hofman W., PT 05/25/2022, 11:05 AM  [facility] [address], [place], [place] [address] Phone # [phone] Fax # [phone]

## 2022-05-29 ENCOUNTER — Encounter: Payer: Self-pay | Admitting: *Deleted

## 2022-06-01 ENCOUNTER — Ambulatory Visit: Payer: Medicare Other | Admitting: Physical Therapy

## 2022-06-01 ENCOUNTER — Encounter: Payer: Self-pay | Admitting: Physical Therapy

## 2022-06-01 DIAGNOSIS — R29818 Other symptoms and signs involving the nervous system: Secondary | ICD-10-CM | POA: Diagnosis not present

## 2022-06-01 DIAGNOSIS — R2689 Other abnormalities of gait and mobility: Secondary | ICD-10-CM

## 2022-06-01 DIAGNOSIS — R2681 Unsteadiness on feet: Secondary | ICD-10-CM | POA: Diagnosis not present

## 2022-06-01 DIAGNOSIS — G20 Parkinson's disease: Secondary | ICD-10-CM | POA: Diagnosis not present

## 2022-06-01 DIAGNOSIS — M6281 Muscle weakness (generalized): Secondary | ICD-10-CM | POA: Diagnosis not present

## 2022-06-01 NOTE — Therapy (Signed)
OUTPATIENT PHYSICAL THERAPY TREATMENT NOTE   Patient Name: Kristin [name] MRN: [id number] DOB:1950/10/19, 71 y.o., female Today's Date: 06/01/2022  PCP: Brayton Mars. Yong Channel, MD REFERRING PROVIDER:  Alonza Bogus, DO  END OF SESSION:   PT End of Session - 06/01/22 1447     Visit Number 4    Number of Visits 9    Date for PT Re-Evaluation 07/07/22    Authorization Type Medicare/[employer]    PT Start Time 8119    PT Stop Time 1528    PT Time Calculation (min) 40 min    Activity Tolerance Patient tolerated treatment well    Behavior During Therapy [facility] for tasks assessed/performed               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   Hyperlipidemia    Hypertension    Infertility, female    Low back pain 10/17/2007   Qualifier: Diagnosis of  By: [name], [name]    Measles as a child   Obesity    Osteoarthritis    Parkinson disease ([place])    Plantar fasciitis of left foot 06/19/2012   Preventative health care 12/19/2015   Rheumatoid arthritis ([place]) 01/08/2018   Rosacea 10/05/2014   Swallowing difficulty    Urinary frequency 02/25/2013   Visual floaters 05/11/2014   Past Surgical History:  Procedure Laterality Date   ABDOMINAL HYSTERECTOMY  2006   total   esophageal     stretching   HERNIA REPAIR N/A    laporoscopy     LEFT HEART CATH AND CORONARY ANGIOGRAPHY N/A 06/02/2019   Procedure: LEFT HEART CATH  AND CORONARY ANGIOGRAPHY;  Surgeon: Jettie Booze, MD;  Location: [facility];  Service: Cardiovascular;  Laterality: N/A;   TONSILLECTOMY     TOTAL HIP ARTHROPLASTY Right 2010   wisdom teeth extracted     Patient Active Problem List   Diagnosis Date Noted   Vitamin B 12 deficiency 05/30/2021   Anemia 03/16/2020   Coronary artery disease involving native coronary artery of native heart without angina pectoris 14/78/2956   Diastolic dysfunction 21/30/8657   Rheumatoid arthritis ([place]) 01/08/2018   Hand pain, right 10/15/2017   Vitamin D deficiency 12/20/2016   Prediabetes 12/20/2016   Shortness of breath on exertion 11/27/2016   Morbid obesity ([place])    Constipation 12/07/2015   BCC (basal cell carcinoma of skin) 10/05/2014   Rosacea 10/05/2014   Parkinson's disease ([place]) 05/11/2014   Screening for cervical cancer 07/17/2012   Foot pain, bilateral 06/19/2012   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   Depression with anxiety 11/02/2009   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   Lumbar back pain with radiculopathy affecting lower extremity 10/17/2007   Essential hypertension 07/05/2007   Osteoarthritis 07/05/2007   Hyperlipidemia 06/26/2007   H/O: iron deficiency anemia  05/08/2007   Hiatal hernia with GERD 05/08/2007    REFERRING DIAG: G20 (ICD-10-CM) - Parkinson's disease ([place])  THERAPY DIAG:  Unsteadiness on feet  Other abnormalities of gait and mobility  Muscle weakness (generalized)  Rationale for Evaluation and Treatment Rehabilitation  PERTINENT HISTORY: See above for PMH  PRECAUTIONS: Fall  SUBJECTIVE: Been good this week; can feel the leg at times.  Went out walking with walking pole; the lower pole height and pushing behind is very helpful.  PAIN:  Are you having pain? Yes: NPRS scale: 4/10 Pain location: R hamstrings Pain description: sore, tightness Aggravating factors: when going up to stand Relieving factors: sitting, stretching is  okay   OBJECTIVE:    TODAY'S TREATMENT: 06/01/2022 Activity Comments  NuStep, Level 5, 4 extremities, x 8 minutes Cues to keep SPM>90 for increased intensity of warm-up activity  Sit<>stand 5 reps, 3 sets Cues for full knee extension upon standing; third rep standing on [employer]  SLS with foot taps, then into runner's stretch position, 10, then 15 reps each foot; then SLS with hip/knee flexion>runner's stretch position 2 x 10 reps each foot Cues for improved timing, coordination with RLE  Standing on [employer]:   Forward>back step and weightshift Side step and weigthshift feet apart/feet together EC, 30 sec, then with feet together EC head turns/nods UE support  Gait with single walking pole on outdoor surfaces, 800 ft, mod independently (approx 7 minutes) Pt takes several brief standing breaks during gait.        PATIENT EDUCATION: Education details: walking program for home-try to walk 10-15 minutes, 3-4 times per week Person educated: Patient Education method: Customer service manager Education comprehension: verbalized understanding and returned demonstration  Access Code: [email or web address] URL: [email or web address] Date: 05/17/2022-most recent update Prepared by: [facility]  Exercises - Standing on Foam Pad  - 1 x daily - 5 x weekly - 1 sets - 3 reps - 15 sec hold - Seated Hamstring Stretch  - 2 x daily - 7 x weekly - 1 sets - 3 reps - Stride Stance Weight Shift  - 1-2 x daily - 7 x weekly - 1-2 sets - 10 reps  ------------------------------------------------------------------------------------- (objective measures completed at initial evaluation unless otherwise dated)   DIAGNOSTIC FINDINGS: NA   COGNITION: Overall cognitive status: Within functional limits for tasks assessed             POSTURE: rounded shoulders   LOWER EXTREMITY ROM:   A/ROM [employer]   Active  Right Eval Left Eval  Hip flexion      Hip extension      Hip  abduction      Hip adduction      Hip internal rotation      Hip external rotation      Knee flexion      Knee extension      Ankle dorsiflexion      Ankle plantarflexion      Ankle inversion      Ankle eversion       (Blank rows = not tested)   LOWER EXTREMITY MMT:  Grossly tested at least 4/5   MMT Right Eval Left Eval  Hip flexion      Hip extension      Hip abduction      Hip adduction      Hip internal rotation      Hip external rotation      Knee flexion  Knee extension      Ankle dorsiflexion      Ankle plantarflexion      Ankle inversion      Ankle eversion      (Blank rows = not tested)     TRANSFERS: Assistive device utilized: None  Sit to stand: Modified independence Stand to sit: Modified independence   GAIT: Gait pattern: step through pattern and decreased stride length Distance walked: 50 ft Assistive device utilized:  single walking pole and None Level of assistance: Modified independence Comments: Slightly slowed walking pace with no device versus using single walking pole   FUNCTIONAL TESTs:  5 times sit to stand: 13.56 sec Timed up and go (TUG): 13.91 sec Gait velocity:  12.12 sec (2.7 ft/sec) TUG cognitive:  14.94 sec MiniBESTest:  20/28   [facility] PT Assessment - 05/15/22 0001                Mini-BESTest    Sit To Stand Normal: Comes to stand without use of hands and stabilizes independently.     Rise to Toes < 3 s.   1.68    Stand on one leg (left) Moderate: < 20 s   8.06, 2.06    Stand on one leg (right) Moderate: < 20 s   0.97, 3.22    Stand on one leg - lowest score 1     Compensatory Stepping Correction - Forward Normal: Recovers independently with a single, large step (second realignement is allowed).     Compensatory Stepping Correction - Backward Normal: Recovers independently with a single, large step     Compensatory Stepping Correction - Left Lateral Normal: Recovers independently with 1 step (crossover or lateral OK)      Compensatory Stepping Correction - Right Lateral Normal: Recovers independently with 1 step (crossover or lateral OK)     Stepping Corredtion Lateral - lowest score 2     Stance - Feet together, eyes open, firm surface  Normal: 30s     Stance - Feet together, eyes closed, foam surface  Moderate: < 30s   9.56    Incline - Eyes Closed Normal: Stands independently 30s and aligns with gravity     Change in Gait Speed Moderate: Unable to change walking speed or signs of imbalance     Walk with head turns - Horizontal Moderate: performs head turns with reduction in gait speed.     Walk with pivot turns Moderate:Turns with feet close SLOW (>4 steps) with good balance.     Step over obstacles Moderate: Steps over box but touches box OR displays cautious behavior by slowing gait.     Timed UP & GO with Dual Task Normal: No noticeable change in sitting, standing or walking while backward counting when compared to TUG without     Mini-BEST total score 20                       TODAY'S TREATMENT:  Initiated HEP-see below     PATIENT EDUCATION: Education details: Eval results, POC, initiated HEP Person educated: Patient Education method: Explanation, Demonstration, and Handouts Education comprehension: verbalized understanding, returned demonstration, and verbal cues required     HOME EXERCISE PROGRAM: Access Code: [email or web address] URL: [email or web address] Date: 05/15/2022 Prepared by: [facility]   Exercises - Standing on Foam Pad  - 1 x daily - 5 x weekly - 1 sets - 3 reps - 15 sec hold - Heel Raises  with Counter Support  - 1 x daily - 5 x weekly - 1-2 sets - 10 reps - 3 sec hold       GOALS: Goals reviewed with patient? Yes   SHORT TERM GOALS: Target date: 06/09/2022   Pt will be independent with HEP for improved strength, transfers, balance. Baseline: Goal status: INITIAL   2.  Pt will improve 5x sit<>stand to less than or equal to  12.5 sec to demonstrate improved functional strength and transfer efficiency. Baseline: 13.56 sec Goal status: INITIAL   LONG TERM GOALS: Target date: 07/07/2022   Pt will be independent with HEP for improved strength, transfers, balance. Baseline:  Goal status: INITIAL   2.  Pt will improve TUG score to less than or equal to 13 sec for decreased fall risk. Baseline: 13.91 sec Goal status: INITIAL   3.  Pt will improve MiniBESTest score to at least 22/28 to decrease fall risk. Baseline: 20/28 Goal status: INITIAL   4.  Pt will verbalize plans for walking aspect of fitness upon d/c from PT. Baseline:  Goal status: INITIAL   ASSESSMENT:   CLINICAL IMPRESSION: Continued skilled PT session today on SLS and compliant surfaces, with pt continuing to need minimal UE support for improved stability.  Pt brought in her walking pole today, with already lowered height; she demonstrates improved posture, improved step length with slightly lower walking pole.  She does get off sequence at times, but is able to return to optimal sequence.  Gait x 800 ft outdoors, with several standing breaks.  Educated on how to progress walking program for home.  She will continue to benefit from skilled PT towards goals for improved functional mobility and decreased fall risk.    OBJECTIVE IMPAIRMENTS Abnormal gait, decreased balance, decreased mobility, difficulty walking, decreased strength, and bradykinesia, dyskinesia (R foot) .    ACTIVITY LIMITATIONS standing and locomotion level   PARTICIPATION LIMITATIONS: community activity   PERSONAL FACTORS 3+ comorbidities: see above for PMH  are also affecting patient's functional outcome.    REHAB POTENTIAL: Good   CLINICAL DECISION MAKING: Stable/uncomplicated   EVALUATION COMPLEXITY: Low   PLAN: PT FREQUENCY:  2x/wk for 1 week, then 1x/wk for 7 weeks   PT DURATION: 8 weeks including eval week   PLANNED INTERVENTIONS: Therapeutic exercises, Therapeutic  activity, Neuromuscular re-education, Balance training, Gait training, Patient/Family education, Self Care, Stair training, DME instructions, and Manual therapy   PLAN FOR NEXT SESSION: Ask about walking program at home; check STGs; add to HEP for functional strength, balance, walking program-work on SLS     Pernie Grosso W., PT 06/01/2022, 3:34 PM  [facility] at [facility] [address], [place], [place] [address] Phone # [phone] Fax # [phone]

## 2022-06-02 DIAGNOSIS — Z23 Encounter for immunization: Secondary | ICD-10-CM | POA: Diagnosis not present

## 2022-06-08 ENCOUNTER — Encounter: Payer: Self-pay | Admitting: Physical Therapy

## 2022-06-08 ENCOUNTER — Ambulatory Visit: Payer: Medicare Other | Attending: Neurology | Admitting: Physical Therapy

## 2022-06-08 DIAGNOSIS — R2689 Other abnormalities of gait and mobility: Secondary | ICD-10-CM | POA: Diagnosis not present

## 2022-06-08 DIAGNOSIS — M6281 Muscle weakness (generalized): Secondary | ICD-10-CM | POA: Diagnosis not present

## 2022-06-08 DIAGNOSIS — R2681 Unsteadiness on feet: Secondary | ICD-10-CM | POA: Insufficient documentation

## 2022-06-08 NOTE — Therapy (Signed)
OUTPATIENT PHYSICAL THERAPY TREATMENT NOTE   Patient Name: Kristin [name] MRN: [id number] DOB:01/07/1951, 71 y.o., female Today's Date: 06/08/2022  PCP: Brayton Mars. Yong Channel, MD REFERRING PROVIDER:  Alonza Bogus, DO  END OF SESSION:   PT End of Session - 06/08/22 1022     Visit Number 5    Number of Visits 9    Date for PT Re-Evaluation 07/07/22    Authorization Type Medicare/[employer]    PT Start Time 1020    PT Stop Time 1101    PT Time Calculation (min) 41 min    Equipment Utilized During Treatment Gait belt    Activity Tolerance Patient tolerated treatment well    Behavior During Therapy WFL for tasks assessed/performed                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   Hyperlipidemia    Hypertension    Infertility, female    Low back pain 10/17/2007   Qualifier: Diagnosis of  By: [name], [name]    Measles as a child   Obesity    Osteoarthritis    Parkinson disease    Plantar fasciitis of left foot 06/19/2012   Preventative health care 12/19/2015   Rheumatoid arthritis ([place]) 01/08/2018   Rosacea 10/05/2014   Swallowing difficulty    Urinary frequency 02/25/2013   Visual floaters 05/11/2014   Past Surgical History:  Procedure Laterality Date   ABDOMINAL HYSTERECTOMY  2006   total   esophageal     stretching   HERNIA REPAIR N/A    laporoscopy     LEFT HEART CATH AND CORONARY  ANGIOGRAPHY N/A 06/02/2019   Procedure: LEFT HEART CATH AND CORONARY ANGIOGRAPHY;  Surgeon: Jettie Booze, MD;  Location: [facility];  Service: Cardiovascular;  Laterality: N/A;   TONSILLECTOMY     TOTAL HIP ARTHROPLASTY Right 2010   wisdom teeth extracted     Patient Active Problem List   Diagnosis Date Noted   Vitamin B 12 deficiency 05/30/2021   Anemia 03/16/2020   Coronary artery disease involving native coronary artery of native heart without angina pectoris 70/26/3785   Diastolic dysfunction 88/50/2774   Rheumatoid arthritis ([place]) 01/08/2018   Hand pain, right 10/15/2017   Vitamin D deficiency 12/20/2016   Prediabetes 12/20/2016   Shortness of breath on exertion 11/27/2016   Morbid obesity ([place])    Constipation 12/07/2015   BCC (basal cell carcinoma of skin) 10/05/2014   Rosacea 10/05/2014   Parkinson's disease ([place]) 05/11/2014   Screening for cervical cancer 07/17/2012   Foot pain, bilateral 06/19/2012   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   Depression with anxiety 11/02/2009   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   Lumbar back pain with radiculopathy affecting lower extremity 10/17/2007   Essential hypertension 07/05/2007   Osteoarthritis 07/05/2007  Hyperlipidemia 06/26/2007   H/O: iron deficiency anemia 05/08/2007   Hiatal hernia with GERD 05/08/2007    REFERRING DIAG: G20 (ICD-10-CM) - Parkinson's disease ([place])  THERAPY DIAG:  Unsteadiness on feet  Other abnormalities of gait and mobility  Muscle weakness (generalized)  Rationale for Evaluation and Treatment Rehabilitation  PERTINENT HISTORY: See above for PMH  PRECAUTIONS: Fall  SUBJECTIVE: The tightness in R hamstrings has limited me.  Walked twice in the neighborhood, up to 16 minutes with breaks. PAIN:  Are you having pain? Yes: NPRS scale: 1-4/10 Pain location: R hamstrings Pain description: sore, tightness Aggravating factors: when going up to stand Relieving factors:  sitting, stretching is okay   OBJECTIVE:    TODAY'S TREATMENT: 06/08/2022 Activity Comments  5x sit<>stand: 12.03 sec Improved from 13.56 sec  TUG:  13.41 sec, 12.06  sec, 11.65 sec (12.3 sec avg) Improved from 13.91 sec  REviewed standing exercises: Stagger stance forward/back rocking Wide BOS ant/posterior weightshift for hamstring, gastroc stretch Return demo understanding and verbalizes performing other exercises at home   SLS with foot taps x 10 to 12" step,  then SLS with hip/knee flexion>runner's stretch position x 15 reps each foot   Forward step over obstacle> back step and weightshift, 10 reps; side step over obstacle x 10 reps; 2 sets UE support; used mirror for visual cues for feedback for increased foot clearance  Discussed recalibration-pt reports feeling like she is taking large steps and visualizes seeing normal size steps   Gait x 1000 ft indoor and outdoor gait, using single walking pole, 8:30 2-3 standing rest breaks with cues for at least 3 deep breaths during standing break       Access Code: [email or web address] URL: [email or web address] Date: 05/17/2022-most recent update Prepared by: [facility]  Exercises - Standing on Foam Pad  - 1 x daily - 5 x weekly - 1 sets - 3 reps - 15 sec hold - Seated Hamstring Stretch  - 2 x daily - 7 x weekly - 1 sets - 3 reps - Stride Stance Weight Shift  - 1-2 x daily - 7 x weekly - 1-2 sets - 10 reps  ------------------------------------------------------------------------------------- (objective measures completed at initial evaluation unless otherwise dated)   DIAGNOSTIC FINDINGS: NA   COGNITION: Overall cognitive status: Within functional limits for tasks assessed             POSTURE: rounded shoulders   LOWER EXTREMITY ROM:   A/ROM [facility]   Active  Right Eval Left Eval  Hip flexion      Hip extension      Hip abduction      Hip adduction      Hip internal rotation       Hip external rotation      Knee flexion      Knee extension      Ankle dorsiflexion      Ankle plantarflexion      Ankle inversion      Ankle eversion       (Blank rows = not tested)   LOWER EXTREMITY MMT:  Grossly tested at least 4/5   MMT Right Eval Left Eval  Hip flexion      Hip extension      Hip abduction      Hip adduction      Hip internal rotation      Hip external rotation      Knee flexion      Knee  extension      Ankle dorsiflexion      Ankle plantarflexion      Ankle inversion      Ankle eversion      (Blank rows = not tested)     TRANSFERS: Assistive device utilized: None  Sit to stand: Modified independence Stand to sit: Modified independence   GAIT: Gait pattern: step through pattern and decreased stride length Distance walked: 50 ft Assistive device utilized:  single walking pole and None Level of assistance: Modified independence Comments: Slightly slowed walking pace with no device versus using single walking pole   FUNCTIONAL TESTs:  5 times sit to stand: 13.56 sec Timed up and go (TUG): 13.91 sec Gait velocity:  12.12 sec (2.7 ft/sec) TUG cognitive:  14.94 sec MiniBESTest:  20/28   [facility] PT Assessment - 05/15/22 0001                Mini-BESTest    Sit To Stand Normal: Comes to stand without use of hands and stabilizes independently.     Rise to Toes < 3 s.   1.68    Stand on one leg (left) Moderate: < 20 s   8.06, 2.06    Stand on one leg (right) Moderate: < 20 s   0.97, 3.22    Stand on one leg - lowest score 1     Compensatory Stepping Correction - Forward Normal: Recovers independently with a single, large step (second realignement is allowed).     Compensatory Stepping Correction - Backward Normal: Recovers independently with a single, large step     Compensatory Stepping Correction - Left Lateral Normal: Recovers independently with 1 step (crossover or lateral OK)     Compensatory Stepping Correction - Right Lateral Normal:  Recovers independently with 1 step (crossover or lateral OK)     Stepping Corredtion Lateral - lowest score 2     Stance - Feet together, eyes open, firm surface  Normal: 30s     Stance - Feet together, eyes closed, foam surface  Moderate: < 30s   9.56    Incline - Eyes Closed Normal: Stands independently 30s and aligns with gravity     Change in Gait Speed Moderate: Unable to change walking speed or signs of imbalance     Walk with head turns - Horizontal Moderate: performs head turns with reduction in gait speed.     Walk with pivot turns Moderate:Turns with feet close SLOW (>4 steps) with good balance.     Step over obstacles Moderate: Steps over box but touches box OR displays cautious behavior by slowing gait.     Timed UP & GO with Dual Task Normal: No noticeable change in sitting, standing or walking while backward counting when compared to TUG without     Mini-BEST total score 20                       TODAY'S TREATMENT:  Initiated HEP-see below     PATIENT EDUCATION: Education details: Eval results, POC, initiated HEP Person educated: Patient Education method: Explanation, Demonstration, and Handouts Education comprehension: verbalized understanding, returned demonstration, and verbal cues required     HOME EXERCISE PROGRAM: Access Code: [email or web address] URL: [email or web address] Date: 05/15/2022 Prepared by: [facility]   Exercises - Standing on Foam Pad  - 1 x daily - 5 x weekly - 1 sets - 3 reps - 15 sec hold - Heel Raises with  Counter Support  - 1 x daily - 5 x weekly - 1-2 sets - 10 reps - 3 sec hold       GOALS: Goals reviewed with patient? Yes   SHORT TERM GOALS: Target date: 06/09/2022   Pt will be independent with HEP for improved strength, transfers, balance. Baseline: Goal status: GOAL MET, 06/08/22   2.  Pt will improve 5x sit<>stand to less than or equal to 12.5 sec to demonstrate improved functional  strength and transfer efficiency. Baseline: 13.56 sec>12.03 sec 06/08/22 Goal status: GOAL MET   LONG TERM GOALS: Target date: 07/07/2022   Pt will be independent with HEP for improved strength, transfers, balance. Baseline:  Goal status: IN PROGRESS   2.  Pt will improve TUG score to less than or equal to 13 sec for decreased fall risk. Baseline: 13.91 sec>12.3 sec 06/08/2022 Goal status: GOAL MET   3.  Pt will improve MiniBESTest score to at least 22/28 to decrease fall risk. Baseline: 20/28 Goal status: IN PROGRESS   4.  Pt will verbalize plans for walking aspect of fitness upon d/c from PT. Baseline:  Goal status: IN PROGRESS   ASSESSMENT:   CLINICAL IMPRESSION: Assessed STGs this visit, with pt meeting 2 of 2 STGs.  She has improved functional strength, as noted by improved FTSTS test; she has also met LTG 2 for improved TUG test.  She is reporting increasing time for outdoor gait, twice this past week.  She continues to respond well to education for using varied stretching for stretching and movement throughout the day.  She demo self-awareness of increased effort needed for RLE foot clearance, step length during standing activities with visual cues from mirror.  She will continue to benefit from skilled PT towards goals for improved functional mobility and decreased fall risk.    OBJECTIVE IMPAIRMENTS Abnormal gait, decreased balance, decreased mobility, difficulty walking, decreased strength, and bradykinesia, dyskinesia (R foot) .    ACTIVITY LIMITATIONS standing and locomotion level   PARTICIPATION LIMITATIONS: community activity   PERSONAL FACTORS 3+ comorbidities: see above for PMH  are also affecting patient's functional outcome.    REHAB POTENTIAL: Good   CLINICAL DECISION MAKING: Stable/uncomplicated   EVALUATION COMPLEXITY: Low   PLAN: PT FREQUENCY:  2x/wk for 1 week, then 1x/wk for 7 weeks   PT DURATION: 8 weeks including eval week   PLANNED INTERVENTIONS:  Therapeutic exercises, Therapeutic activity, Neuromuscular re-education, Balance training, Gait training, Patient/Family education, Self Care, Stair training, DME instructions, and Manual therapy   PLAN FOR NEXT SESSION:  Discuss POC/check LTGs if pt feels she is ready for d/c.  Ask about walking program at home; ; add to HEP as needed for functional strength, balance, walking program-work on SLS     Annalese Stiner W., PT 06/08/2022, 12:50 PM  [facility] at [facility] [place], [place], [place] [address] Phone # [phone] Fax # [phone]

## 2022-06-15 ENCOUNTER — Encounter: Payer: Self-pay | Admitting: Physical Therapy

## 2022-06-15 ENCOUNTER — Ambulatory Visit: Payer: Medicare Other | Admitting: Physical Therapy

## 2022-06-15 DIAGNOSIS — R2681 Unsteadiness on feet: Secondary | ICD-10-CM | POA: Diagnosis not present

## 2022-06-15 DIAGNOSIS — R2689 Other abnormalities of gait and mobility: Secondary | ICD-10-CM | POA: Diagnosis not present

## 2022-06-15 DIAGNOSIS — M6281 Muscle weakness (generalized): Secondary | ICD-10-CM

## 2022-06-15 NOTE — Therapy (Signed)
OUTPATIENT PHYSICAL THERAPY TREATMENT NOTE/DISCHARGE SUMMARY   Patient Name: Kristin [name] MRN: [id number] DOB:1950/10/21, 71 y.o., female Today's Date: 06/15/2022  PCP: Brayton Mars. Yong Channel, MD REFERRING PROVIDER:  Alonza Bogus, DO  PHYSICAL THERAPY DISCHARGE SUMMARY  Visits from Start of Care: 6  Current functional level related to goals / functional outcomes: Pt has met all LTGs-see below   Remaining deficits: Bradykinesia, high level balance, endurance   Education / Equipment: Educated in [place], walking for exercise   Patient agrees to discharge. Patient goals were met. Patient is being discharged due to meeting the stated rehab goals. Plan for return screen in 6-8 months due to progressive nature of disease process.  Mady Haagensen, PT 06/15/22 3:48 PM Phone: [phone] Fax: [phone]    END OF SESSION:   PT End of Session - 06/15/22 1405     Visit Number 6    Number of Visits 9    Date for PT Re-Evaluation 07/07/22    Authorization Type Medicare/[employer]    PT Start Time 1405    PT Stop Time 1445    PT Time Calculation (min) 40 min    Equipment Utilized During Treatment --    Activity Tolerance Patient tolerated treatment well    Behavior During Therapy WFL for tasks assessed/performed                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   Hyperlipidemia    Hypertension    Infertility,  female    Low back pain 10/17/2007   Qualifier: Diagnosis of  By: [name], [name]    Measles as a child   Obesity    Osteoarthritis    Parkinson disease    Plantar fasciitis of left foot 06/19/2012   Preventative health care 12/19/2015   Rheumatoid arthritis ([place]) 01/08/2018   Rosacea 10/05/2014   Swallowing difficulty    Urinary frequency 02/25/2013   Visual floaters 05/11/2014   Past Surgical History:  Procedure Laterality Date   ABDOMINAL HYSTERECTOMY  2006   total   esophageal     stretching   HERNIA REPAIR N/A    laporoscopy     LEFT HEART CATH AND CORONARY ANGIOGRAPHY N/A 06/02/2019   Procedure: LEFT HEART CATH AND CORONARY ANGIOGRAPHY;  Surgeon: Jettie Booze, MD;  Location: [facility];  Service: Cardiovascular;  Laterality: N/A;   TONSILLECTOMY     TOTAL HIP ARTHROPLASTY Right 2010   wisdom teeth extracted     Patient Active Problem List   Diagnosis Date Noted   Vitamin B 12 deficiency 05/30/2021   Anemia 03/16/2020   Coronary artery disease involving native coronary artery of native heart without angina pectoris 83/33/8329   Diastolic dysfunction 19/16/6060   Rheumatoid arthritis ([place]) 01/08/2018   Hand pain, right 10/15/2017  Vitamin D deficiency 12/20/2016   Prediabetes 12/20/2016   Shortness of breath on exertion 11/27/2016   Morbid obesity ([place])    Constipation 12/07/2015   BCC (basal cell carcinoma of skin) 10/05/2014   Rosacea 10/05/2014   Parkinson's disease (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   Depression with anxiety 11/02/2009   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   Lumbar back pain with radiculopathy affecting lower extremity 10/17/2007   Essential hypertension 07/05/2007   Osteoarthritis 07/05/2007   Hyperlipidemia 06/26/2007   H/O: iron deficiency anemia 05/08/2007   Hiatal hernia with GERD 05/08/2007    REFERRING  DIAG: G20 (ICD-10-CM) - Parkinson's disease ([place])  THERAPY DIAG:  Unsteadiness on feet  Other abnormalities of gait and mobility  Muscle weakness (generalized)  Rationale for Evaluation and Treatment Rehabilitation  PERTINENT HISTORY: See above for PMH  PRECAUTIONS: Fall  SUBJECTIVE: I'm exhausted.  Had people over to my house, and everyday has been busy.  Walked three times this week.  Think the walking is going better.  PAIN:  Are you having pain? No pain   OBJECTIVE:    TODAY'S TREATMENT: 06/15/2022 Activity Comments  Reviewed HEP:  stagger stance forward/back rocking, seated hamstring stretch.  Also reviewed wide BOS ant/posterior rocking through hips  Pt verbalizes/demo understanding of using these stretches throughout the day.  On [employer]:  reviewed feet apart EO/EC x 15 seconds.  Progressed to EO head turns/nods, then EC head turns/head nods with UE support.  Progressed to EO/EC feet apart 15 sec, with UE support   TUG 10.63:  TUG cog 12.5   MiniBESTest:  22/28 Improved from 20/28             Access Code: [email or web address] URL: [email or web address] Date: 06/15/2022 Prepared by: [facility]  Exercises - Standing on Foam Pad  - 1 x daily - 5 x weekly - 1 sets - 3 reps - 15 sec hold *additions of head turns/nods/ feet together as progression - Seated Hamstring Stretch  - 2 x daily - 7 x weekly - 1 sets - 3 reps - Stride Stance Weight Shift  - 1-2 x daily - 7 x weekly - 1-2 sets - 10 reps - Single Leg Stance with Support  - 1 x daily - 5 x weekly - 1 sets - 3 reps - 10 sec hold  PATIENT EDUCATION: Education details: PT progress towards goals, POC, updates to HEP, plan for d/c today and return screen in 6-8 months.  Discussed and reiterated importance of HEP and walking on a regular basis. Person educated: Patient Education method: Explanation, Demonstration, and Handouts Education comprehension: verbalized understanding and  returned demonstration   ------------------------------------------------------------------------------------- (objective measures completed at initial evaluation unless otherwise dated)   DIAGNOSTIC FINDINGS: NA   COGNITION: Overall cognitive status: Within functional limits for tasks assessed             POSTURE: rounded shoulders   LOWER EXTREMITY ROM:   A/ROM [facility]   Active  Right Eval Left Eval  Hip flexion      Hip extension      Hip abduction      Hip adduction      Hip internal rotation      Hip external rotation      Knee flexion      Knee extension      Ankle dorsiflexion  Ankle plantarflexion      Ankle inversion      Ankle eversion       (Blank rows = not tested)   LOWER EXTREMITY MMT:  Grossly tested at least 4/5   MMT Right Eval Left Eval  Hip flexion      Hip extension      Hip abduction      Hip adduction      Hip internal rotation      Hip external rotation      Knee flexion      Knee extension      Ankle dorsiflexion      Ankle plantarflexion      Ankle inversion      Ankle eversion      (Blank rows = not tested)     TRANSFERS: Assistive device utilized: None  Sit to stand: Modified independence Stand to sit: Modified independence   GAIT: Gait pattern: step through pattern and decreased stride length Distance walked: 50 ft Assistive device utilized:  single walking pole and None Level of assistance: Modified independence Comments: Slightly slowed walking pace with no device versus using single walking pole   FUNCTIONAL TESTs:  5 times sit to stand: 13.56 sec Timed up and go (TUG): 13.91 sec Gait velocity:  12.12 sec (2.7 ft/sec) TUG cognitive:  14.94 sec MiniBESTest:  20/28   [facility] PT Assessment - 05/15/22 0001                Mini-BESTest    Sit To Stand Normal: Comes to stand without use of hands and stabilizes independently.     Rise to Toes < 3 s.   1.68    Stand on one leg (left) Moderate: < 20 s   8.06, 2.06     Stand on one leg (right) Moderate: < 20 s   0.97, 3.22    Stand on one leg - lowest score 1     Compensatory Stepping Correction - Forward Normal: Recovers independently with a single, large step (second realignement is allowed).     Compensatory Stepping Correction - Backward Normal: Recovers independently with a single, large step     Compensatory Stepping Correction - Left Lateral Normal: Recovers independently with 1 step (crossover or lateral OK)     Compensatory Stepping Correction - Right Lateral Normal: Recovers independently with 1 step (crossover or lateral OK)     Stepping Corredtion Lateral - lowest score 2     Stance - Feet together, eyes open, firm surface  Normal: 30s     Stance - Feet together, eyes closed, foam surface  Moderate: < 30s   9.56    Incline - Eyes Closed Normal: Stands independently 30s and aligns with gravity     Change in Gait Speed Moderate: Unable to change walking speed or signs of imbalance     Walk with head turns - Horizontal Moderate: performs head turns with reduction in gait speed.     Walk with pivot turns Moderate:Turns with feet close SLOW (>4 steps) with good balance.     Step over obstacles Moderate: Steps over box but touches box OR displays cautious behavior by slowing gait.     Timed UP & GO with Dual Task Normal: No noticeable change in sitting, standing or walking while backward counting when compared to TUG without     Mini-BEST total score 20  TODAY'S TREATMENT:  Initiated HEP-see below     PATIENT EDUCATION: Education details: Eval results, POC, initiated HEP Person educated: Patient Education method: Explanation, Demonstration, and Handouts Education comprehension: verbalized understanding, returned demonstration, and verbal cues required     HOME EXERCISE PROGRAM: Access Code: [email or web address] URL: [email or web address] Date: 05/15/2022 Prepared by: [facility]   Exercises - Standing on Foam Pad  - 1 x daily - 5 x weekly - 1 sets - 3 reps - 15 sec hold - Heel Raises with Counter Support  - 1 x daily - 5 x weekly - 1-2 sets - 10 reps - 3 sec hold       GOALS: Goals reviewed with patient? Yes   SHORT TERM GOALS: Target date: 06/09/2022   Pt will be independent with HEP for improved strength, transfers, balance. Baseline: Goal status: GOAL MET, 06/08/22   2.  Pt will improve 5x sit<>stand to less than or equal to 12.5 sec to demonstrate improved functional strength and transfer efficiency. Baseline: 13.56 sec>12.03 sec 06/08/22 Goal status: GOAL MET   LONG TERM GOALS: Target date: 07/07/2022   Pt will be independent with HEP for improved strength, transfers, balance. Baseline:  Goal status: GOAL MET, 06/15/2022   2.  Pt will improve TUG score to less than or equal to 13 sec for decreased fall risk. Baseline: 13.91 sec>12.3 sec 06/08/2022 Goal status: GOAL MET   3.  Pt will improve MiniBESTest score to at least 22/28 to decrease fall risk. Baseline: 20/28>22/28 06/15/2022 Goal status: GOAL MET   4.  Pt will verbalize plans for walking aspect of fitness upon d/c from PT. Baseline:  Goal status: GOAL MET, 06/15/2022   ASSESSMENT:   CLINICAL IMPRESSION: Assessed LTGs this visit, as pt is pleased with her functional level and with HEP that she can continue doing at home.  She has demonstrated improved functional strength and balance measures through the course of therapy, and she is reporting walking for exercise on a more consistent basis at home.  She has HEP to address balance deficits.  She is appropriate for d/c at this time.     OBJECTIVE IMPAIRMENTS Abnormal gait, decreased balance, decreased mobility, difficulty walking, decreased strength, and bradykinesia, dyskinesia (R foot) .    ACTIVITY LIMITATIONS standing and locomotion level   PARTICIPATION LIMITATIONS: community activity   PERSONAL FACTORS 3+ comorbidities: see  above for PMH  are also affecting patient's functional outcome.    REHAB POTENTIAL: Good   CLINICAL DECISION MAKING: Stable/uncomplicated   EVALUATION COMPLEXITY: Low   PLAN: PT FREQUENCY:  2x/wk for 1 week, then 1x/wk for 7 weeks   PT DURATION: 8 weeks including eval week   PLANNED INTERVENTIONS: Therapeutic exercises, Therapeutic activity, Neuromuscular re-education, Balance training, Gait training, Patient/Family education, Self Care, Stair training, DME instructions, and Manual therapy   PLAN FOR NEXT SESSION:  D/C this visit.  Plan for return screen in 6-8 months due to progressive nature of disease process.      Frazier Butt., PT 06/15/2022, 3:46 PM   Outpatient Rehab at [facility] [place], [place], [place] [address] Phone # [phone] Fax # [phone]

## 2022-07-23 ENCOUNTER — Other Ambulatory Visit: Payer: Self-pay | Admitting: Family Medicine

## 2022-08-01 ENCOUNTER — Encounter: Payer: Self-pay | Admitting: Family Medicine

## 2022-08-01 ENCOUNTER — Ambulatory Visit (INDEPENDENT_AMBULATORY_CARE_PROVIDER_SITE_OTHER): Payer: Medicare Other | Admitting: Family Medicine

## 2022-08-01 VITALS — BP 144/90 | HR 72 | Temp 98.2°F | Ht 63.0 in | Wt 241.0 lb

## 2022-08-01 DIAGNOSIS — R051 Acute cough: Secondary | ICD-10-CM | POA: Diagnosis not present

## 2022-08-01 DIAGNOSIS — M79605 Pain in left leg: Secondary | ICD-10-CM | POA: Diagnosis not present

## 2022-08-01 DIAGNOSIS — J029 Acute pharyngitis, unspecified: Secondary | ICD-10-CM | POA: Diagnosis not present

## 2022-08-01 DIAGNOSIS — M79604 Pain in right leg: Secondary | ICD-10-CM

## 2022-08-01 MED ORDER — AZITHROMYCIN 250 MG PO TABS: ORAL_TABLET | ORAL | 0 refills | Status: AC

## 2022-08-01 NOTE — Progress Notes (Signed)
Subjective:     Patient ID: Kristin [name], female    DOB: Aug 26, 1951, 71 y.o.   MRN: [id number]  Chief Complaint  Patient presents with   Cough    Severe cough x 10 days, no fever    Sore Throat    Started 3 days ago     HPI Cough for 10days-was productive but better today.  Would cough more in am and hs. .  No f/c. No sob, no congestion Sore throat for 3 days-more irritated.  No heartburn  Husband had stem cell xplant.  He had a fever on 07/28/22  Health Maintenance Due  Topic Date Due   INFLUENZA VACCINE  04/04/2022   COVID-19 Vaccine (8 - 2023-24 season) 05/05/2022    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   Hyperlipidemia    Hypertension    Infertility, female    Low back pain 10/17/2007   Qualifier: Diagnosis of  By: [name], [name]    Measles as a child   Obesity    Osteoarthritis    Parkinson disease    Plantar fasciitis of left foot 06/19/2012   Preventative health care 12/19/2015   Rheumatoid arthritis ([place]) 01/08/2018   Rosacea 10/05/2014   Swallowing difficulty    Urinary frequency 02/25/2013   Visual floaters 05/11/2014    Past Surgical History:  Procedure Laterality Date   ABDOMINAL HYSTERECTOMY  2006   total   esophageal     stretching   HERNIA REPAIR N/A    laporoscopy     LEFT HEART CATH AND CORONARY ANGIOGRAPHY N/A 06/02/2019   Procedure: LEFT HEART CATH AND CORONARY ANGIOGRAPHY;   Surgeon: Jettie Booze, MD;  Location: [facility];  Service: Cardiovascular;  Laterality: N/A;   TONSILLECTOMY     TOTAL HIP ARTHROPLASTY Right 2010   wisdom teeth extracted      Outpatient Medications Prior to Visit  Medication Sig Dispense Refill   solifenacin (VESICARE) 5 MG tablet Take 5 mg by mouth daily.     ALPRAZolam (XANAX) 0.25 MG tablet Take 1 tablet (0.25 mg total) by mouth 2 (two) times daily as needed for anxiety. 30 tablet 1   carbidopa-levodopa (SINEMET CR) 50-200 MG tablet TAKE 1 TABLET BY MOUTH EVERYDAY AT BEDTIME 90 tablet 0   carbidopa-levodopa (SINEMET IR) 25-100 MG tablet TAKE 2 TABLETS BY MOUTH AT 7AM, 2TABS AT 11AM, 1 TAB AT 4PM 450 tablet 0   carvedilol (COREG) 3.125 MG tablet TAKE 1 TABLET BY MOUTH TWICE A DAY WITH MEALS 180 tablet 3   DULoxetine (CYMBALTA) 30 MG capsule TAKE 1 CAPSULE BY MOUTH EVERY DAY 90 capsule 1   escitalopram (LEXAPRO) 20 MG tablet TAKE 1 TABLET BY MOUTH EVERY DAY 90 tablet 1   folic acid (FOLVITE) 1 MG tablet Take 1 mg  by mouth 2 (two) times daily.     linaclotide (LINZESS) 145 MCG CAPS capsule Take 1 capsule (145 mcg total) by mouth daily before breakfast. 30 capsule 11   losartan (COZAAR) 50 MG tablet TAKE 1 TABLET BY MOUTH EVERY DAY 90 tablet 1   methotrexate 250 MG/10ML injection methotrexate sodium 25 mg/mL injection solution  INJECT 0.8MLS SUBCUTANEOUSLY EVERY 7 DAYS     Methotrexate Sodium (METHOTREXATE, PF,) 50 MG/2ML injection      omeprazole (PRILOSEC) 20 MG capsule TAKE 1 CAPSULE BY MOUTH 2 TIMES DAILY AS NEEDED. 180 capsule 1   pramipexole (MIRAPEX) 0.5 MG tablet Take 1 tablet (0.5 mg total) by mouth 3 (three) times daily. 270 tablet 2   rosuvastatin (CRESTOR) 10 MG tablet TAKE 1 TABLET BY MOUTH ON MONDAYS,WEDNESDAYS, AND FRIDAYS 39 tablet 3   solifenacin (VESICARE) 10 MG tablet Take by mouth daily.     No facility-administered medications prior to visit.    Allergies  Allergen Reactions   Scopolamine Other  (See Comments)    Panic hallu  Other Reaction(s): Hallucination, Not available   Prednisone Dermatitis    Facial ulcers  Other Reaction(s): facial swelling   Bactrim [Sulfamethoxazole-Trimethoprim] Other (See Comments)    Oral ulcers and rash   Pb-Hyoscy-Atropine-Scopolamine [Phenobarbital-Belladonna Alk] Other (See Comments)    Panic and hallucinations   Penicillins Hives and Other (See Comments)    Did it involve swelling of the face/tongue/throat, SOB, or low BP? No  Did it involve sudden or severe rash/hives, skin peeling, or any reaction on the inside of your mouth or nose? No  Did you need to seek medical attention at a hospital or doctor's office? No  When did it last happen?      30+ years ago  If all above answers are "NO", may proceed with cephalosporin use.   Sulfa Antibiotics     Other Reaction(s): Not available   ROS neg/noncontributory except as noted HPI/below Legs achey in back B. Tendon on foot hurts as well.  Taking crestor 3x/wk.  On it for 69yr. Saw back doc.  Had MRI.  Did PT but done.  Does go to gym 2x/wk       Objective:     BP (!) 144/90 (BP Location: Left Arm, Patient Position: Sitting, Cuff Size: Large)   Pulse 72   Temp 98.2 F (36.8 C) (Temporal)   Ht _0  (1.6 m)   Wt 241 lb (109.3 kg)   SpO2 95%   BMI 42.69 kg/m  Wt Readings from Last 3 Encounters:  08/01/22 241 lb (109.3 kg)  04/25/22 238 lb 6.4 oz (108.1 kg)  04/13/22 241 lb (109.3 kg)    Physical Exam   Gen: WDWN NAD HEENT: NCAT, conjunctiva not injected, sclera nonicteric TM WNL B, OP moist, no exudates  NECK:  supple, no thyromegaly, no nodes, no carotid bruits CARDIAC: RRR, S1S2+, no murmur.  LUNGS: CTAB. No wheezes ABDOMEN:  BS+, soft, NTND, No HSM, no masses EXT:  no edema MSK: no gross abnormalities. No TTP leg.  MS 5/5 BLE.   NEURO: A&O x3.  CN II-XII intact. Tremors resting PSYCH: normal mood. Good eye contact     Assessment & Plan:   Problem List Items  Addressed This Visit   None Visit Diagnoses     Acute cough    -  Primary   Pharyngitis, unspecified etiology       Pain in both lower extremities  Cough-some improvement-husb immunocompromised-will tx w/zpack Pharyngitis-exam benign and zpk will cover Leg pain-?from back but bilat, ? From crestor, other.  Hold crestor 1-2 wks.  Stretches, tylenol/ibuprofen.  Let us know if not improving/worse  No orders of the defined types were placed in this encounter.   Wellington Hampshire, MD

## 2022-08-01 NOTE — Patient Instructions (Addendum)
It was very nice to see you today!  Legs-hold crestor for 1-2 weeks.  Do your physical therapy.  Take the zpack  Tylenol or ibuprofen if needed for pain  Worse or not improving let us know  Happy Holidays!   PLEASE NOTE:  If you had any lab tests please let us know if you have not heard back within a few days. You may see your results on MyChart before we have a chance to review them but we will give you a call once they are reviewed by [place]. If we ordered any referrals today, please let us know if you have not heard from their office within the next week.   Please try these tips to maintain a healthy lifestyle:  Eat most of your calories during the day when you are active. Eliminate processed foods including packaged sweets (pies, cakes, cookies), reduce intake of potatoes, white bread, white pasta, and white rice. Look for whole grain options, oat flour or almond flour.  Each meal should contain half fruits/vegetables, one quarter protein, and one quarter carbs (no bigger than a computer mouse).  Cut down on sweet beverages. This includes juice, soda, and sweet tea. Also watch fruit intake, though this is a healthier sweet option, it still contains natural sugar! Limit to 3 servings daily.  Drink at least 1 glass of water with each meal and aim for at least 8 glasses per day  Exercise at least 150 minutes every week.

## 2022-08-03 ENCOUNTER — Ambulatory Visit: Payer: Medicare Other | Admitting: Physical Therapy

## 2022-08-03 ENCOUNTER — Other Ambulatory Visit: Payer: Self-pay | Admitting: Neurology

## 2022-08-03 ENCOUNTER — Ambulatory Visit: Payer: Medicare Other

## 2022-08-03 ENCOUNTER — Ambulatory Visit: Payer: Medicare Other | Attending: Neurology | Admitting: Occupational Therapy

## 2022-08-03 DIAGNOSIS — R29818 Other symptoms and signs involving the nervous system: Secondary | ICD-10-CM | POA: Insufficient documentation

## 2022-08-03 DIAGNOSIS — R471 Dysarthria and anarthria: Secondary | ICD-10-CM

## 2022-08-03 DIAGNOSIS — R1312 Dysphagia, oropharyngeal phase: Secondary | ICD-10-CM | POA: Insufficient documentation

## 2022-08-03 DIAGNOSIS — G20B2 Parkinson's disease with dyskinesia, with fluctuations: Secondary | ICD-10-CM

## 2022-08-03 NOTE — Therapy (Signed)
[facility] [place] [address] [place], [place], [address] Phone: [phone]   Fax:  [phone]  Patient Details  Name: Kristin [name] MRN: [id number] Date of Birth: [birth year] Referring Provider:  Ludwig Clarks, DO  Encounter Date: 08/03/2022  Occupational Therapy Parkinson's Disease Screen  Hand dominance:  Left   9-hole peg test:    RUE  23.25 sec        LUE  22.96 sec  Box & Blocks Test:   RUE  58 blocks        LUE  58 blocks  Change in ability to perform ADLs/IADLs:  Pt reports difficulty getting foods, particularly salad, from plate to mouth.  Also reports that she is having difficulty with swallowing.  Pt reports that she used to feel that she was able to adapt, however does not feel that she is adapting as well.  Other Comments:  Pt reports that she feels like things are "unfortunately progressing."  She reports that she is dropping things more.  Reports handwriting is "interesting", stating that it is starting to look like "something is wrong".    Pt would benefit from occupational therapy evaluation due to decrease in ADLs, decreased coordination with self-feeding, clothing fasteners, slowing bilaterally with objective assessments.   Simonne Come, OT 08/03/2022, 11:45 AM  [facility] [place] [address], [place], [place], [address] Phone: [phone]   Fax:  [phone]

## 2022-08-03 NOTE — Therapy (Signed)
[facility] [place] [address] [place], [place], [address] Phone: [phone]   Fax:  [phone]  Patient Details  Name: Kristin [name] MRN: [id number] Date of Birth: 01-05-1951 Referring Provider:  Ludwig Clarks, DO  Encounter Date: 08/03/2022  Speech Therapy Parkinson's Disease Screen   Decibel Level today: low 70s dB  (WNL=70-72 dB) with sound level meter 30cm away from pt's masked mouth. Pt's conversational volume has remained essentially the same since last screen. However pt endorses "scratchy" voice and mild hoarseness is heard today in the screen.  Additionally pt tells SLP her husband has mentioned in the past 6 months that she "mumbles" and this is primarily later in the afternoons and evenings.  Pt reports difficulty with swallowing, which does warrant further evaluation  Pt would benefit from speech-language eval for dysarthria , and swallowing as needed - please order via EPIC if agreed.   [facility], [place] 08/03/2022, 12:35 PM  [facility] [place] [address], [place], [place], [address] Phone: [phone]   Fax:  [phone]

## 2022-08-09 DIAGNOSIS — M7671 Peroneal tendinitis, right leg: Secondary | ICD-10-CM | POA: Diagnosis not present

## 2022-08-16 DIAGNOSIS — M059 Rheumatoid arthritis with rheumatoid factor, unspecified: Secondary | ICD-10-CM | POA: Diagnosis not present

## 2022-08-16 DIAGNOSIS — Z79899 Other long term (current) drug therapy: Secondary | ICD-10-CM | POA: Diagnosis not present

## 2022-08-16 DIAGNOSIS — E559 Vitamin D deficiency, unspecified: Secondary | ICD-10-CM | POA: Diagnosis not present

## 2022-08-16 DIAGNOSIS — M25571 Pain in right ankle and joints of right foot: Secondary | ICD-10-CM | POA: Diagnosis not present

## 2022-08-17 ENCOUNTER — Encounter: Payer: Self-pay | Admitting: *Deleted

## 2022-08-18 ENCOUNTER — Other Ambulatory Visit: Payer: Self-pay | Admitting: Family Medicine

## 2022-08-18 ENCOUNTER — Other Ambulatory Visit: Payer: Self-pay | Admitting: Neurology

## 2022-08-18 DIAGNOSIS — Z5181 Encounter for therapeutic drug level monitoring: Secondary | ICD-10-CM | POA: Diagnosis not present

## 2022-08-18 DIAGNOSIS — G20A1 Parkinson's disease without dyskinesia, without mention of fluctuations: Secondary | ICD-10-CM

## 2022-08-18 DIAGNOSIS — Z79899 Other long term (current) drug therapy: Secondary | ICD-10-CM | POA: Diagnosis not present

## 2022-08-21 DIAGNOSIS — M25571 Pain in right ankle and joints of right foot: Secondary | ICD-10-CM | POA: Diagnosis not present

## 2022-08-22 ENCOUNTER — Ambulatory Visit: Payer: Medicare Other

## 2022-08-22 ENCOUNTER — Ambulatory Visit: Payer: Medicare Other | Attending: Neurology | Admitting: Occupational Therapy

## 2022-08-22 ENCOUNTER — Other Ambulatory Visit: Payer: Self-pay

## 2022-08-22 DIAGNOSIS — R1312 Dysphagia, oropharyngeal phase: Secondary | ICD-10-CM | POA: Diagnosis not present

## 2022-08-22 DIAGNOSIS — R29818 Other symptoms and signs involving the nervous system: Secondary | ICD-10-CM | POA: Insufficient documentation

## 2022-08-22 DIAGNOSIS — R278 Other lack of coordination: Secondary | ICD-10-CM | POA: Diagnosis not present

## 2022-08-22 DIAGNOSIS — R293 Abnormal posture: Secondary | ICD-10-CM | POA: Diagnosis not present

## 2022-08-22 DIAGNOSIS — M6281 Muscle weakness (generalized): Secondary | ICD-10-CM | POA: Diagnosis not present

## 2022-08-22 DIAGNOSIS — R471 Dysarthria and anarthria: Secondary | ICD-10-CM | POA: Insufficient documentation

## 2022-08-22 DIAGNOSIS — R2681 Unsteadiness on feet: Secondary | ICD-10-CM | POA: Insufficient documentation

## 2022-08-22 NOTE — Therapy (Signed)
OUTPATIENT OCCUPATIONAL THERAPY NEURO EVALUATION  Patient Name: EVALUNA UTKE MRN: [id number] DOB:09/11/1950, 71 y.o., female Today's Date: 08/22/2022  PCP: Marin Olp, MD REFERRING PROVIDER: Ludwig Clarks, DO  END OF SESSION:  OT End of Session - 08/22/22 0853     Visit Number 1    Number of Visits 13    Date for OT Re-Evaluation 10/20/22    Authorization Type Medicare A&B    OT Start Time 0850    OT Stop Time 0938    OT Time Calculation (min) 48 min             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   Hyperlipidemia    Hypertension    Infertility, female    Low back pain 10/17/2007   Qualifier: Diagnosis of  By: [name], [name]    Measles as a child   Obesity    Osteoarthritis    Parkinson disease    Plantar fasciitis of left foot 06/19/2012   Preventative health care 12/19/2015   Rheumatoid arthritis ([place]) 01/08/2018   Rosacea 10/05/2014   Swallowing difficulty    Urinary frequency 02/25/2013   Visual floaters 05/11/2014   Past Surgical History:  Procedure Laterality Date   ABDOMINAL HYSTERECTOMY  2006   total   esophageal     stretching   HERNIA REPAIR N/A    laporoscopy     LEFT HEART CATH AND CORONARY ANGIOGRAPHY N/A 06/02/2019   Procedure: LEFT HEART CATH AND CORONARY ANGIOGRAPHY;  Surgeon: Jettie Booze, MD;  Location: [facility];  Service: Cardiovascular;  Laterality:  N/A;   TONSILLECTOMY     TOTAL HIP ARTHROPLASTY Right 2010   wisdom teeth extracted     Patient Active Problem List   Diagnosis Date Noted   Vitamin B 12 deficiency 05/30/2021   Anemia 03/16/2020   Coronary artery disease involving native coronary artery of native heart without angina pectoris 38/25/0539   Diastolic dysfunction 76/73/4193   Rheumatoid arthritis ([place]) 01/08/2018   Hand pain, right 10/15/2017   Vitamin D deficiency 12/20/2016   Prediabetes 12/20/2016   Shortness of breath on exertion 11/27/2016   Morbid obesity ([place])    Constipation 12/07/2015   BCC (basal cell carcinoma of skin) 10/05/2014   Rosacea 10/05/2014   Parkinson's disease ([place]) 05/11/2014   Screening for cervical cancer 07/17/2012   Foot pain, bilateral 06/19/2012   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   Depression with anxiety 11/02/2009   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   Lumbar back pain with radiculopathy affecting lower extremity 10/17/2007   Essential hypertension 07/05/2007   Osteoarthritis 07/05/2007   Hyperlipidemia 06/26/2007   H/O: iron deficiency anemia 05/08/2007   Hiatal hernia with GERD 05/08/2007    ONSET DATE: referral date 08/03/22  REFERRING DIAG: G20.B2 (ICD-10-CM) - Parkinson's disease with  dyskinesia and fluctuating manifestations  THERAPY DIAG:  Other symptoms and signs involving the nervous system  Unsteadiness on feet  Muscle weakness (generalized)  Other lack of coordination  Abnormal posture  Rationale for Evaluation and Treatment: Rehabilitation  SUBJECTIVE:   SUBJECTIVE STATEMENT: Pt reports noticing that she makes changes in what she orders when she goes out to eat (no longer ordering soups or salads due to decreased motor control/coordination).  Pt reports noticing that sometimes her hand will release and she will just drop a glass.  Pt reports difficulty with handwriting and typing.  Notices that it takes "a while" to get dressed, especially to  put on shoes.   Pt accompanied by: self  PERTINENT HISTORY: PD, rheumatoid arthritis, depression and anxiety  PRECAUTIONS: Fall  WEIGHT BEARING RESTRICTIONS: No  PAIN:  Are you having pain? No  FALLS: Has patient fallen in last 6 months? No  LIVING ENVIRONMENT: Lives with: lives with their family and lives with their spouse (and adult son lives with them, helps with going to the grocery store, putting away groceries, and odd jobs around the house) Lives in: House/apartment Stairs: Yes: Internal: full flight of steps, but lives on main floor with no need to use steps;  and External: 1 steps; none Has following equipment at home: shower chair, Grab bars, and hand held shower head, and walking stick  PLOF: Independent and Independent with basic ADLs, requires increased time for ADLs  PATIENT GOALS: to get a little more efficient with movements and every day tasks  OBJECTIVE:   HAND DOMINANCE: Left  ADLs: Overall ADLs: Mod I, however reports that it takes quite a bit longer to put on shoes, socks, and jacket Transfers/ambulation related to ADLs: utilizes walking stick with all mobility in home and in community Eating: reports that she has changed what she eats, especially when out at restaurant, with no longer eating soups or salad due to increased time to manage LB Dressing: increased time to put on shoes and socks Equipment: Shower seat with back, Grab bars, Walk in shower, and hand held shower head  IADLs: Shopping: son does the shopping Light housekeeping: laundry, empties the dishwasher Meal Prep: completes all the cooking, reports it does not go "as smoothly" as it has; easily distracted if phone rings or gets distracted by anything Community mobility: still driving Medication management: Mod I, uses weekly pill box Handwriting: 90% legible and Increased time  MOBILITY STATUS: Needs Assist: utilizing walking stick with all mobility  POSTURE COMMENTS:  rounded  shoulders  ACTIVITY TOLERANCE: Activity tolerance: WFL, reports taking longer for all ADLs and IADLs  FUNCTIONAL OUTCOME MEASURES: Physical performance test: #1 (handwriting):13.28 sec*     #2 (self-feeding): 13.57 sec     #4 (jacket on/off): 16.25 sec  UPPER EXTREMITY ROM:  WFL bilaterally   UPPER EXTREMITY MMT:   WFL bilaterally   COORDINATION: 9 Hole Peg test: Right: 22.10 sec; Left: 26.90 sec Box and Blocks:  Right 56 blocks (hitting barrier x4), Left 60 blocks Tremors: Resting, LLE  SENSATION: WFL  COGNITION: Overall cognitive status: Within functional limits for tasks assessed  VISION: Subjective report: notes slight change over time Baseline vision: Wears glasses all the time Visual history:  sees a retina specialist, dr does note beginnings of cataracts  VISION ASSESSMENT: Not tested  OBSERVATIONS: mild resting tremor LLE during evaluation   TODAY'S TREATMENT:  08/22/22 Educated on hand placement with donning jacket.  Pt able to return demonstration with good technique. Pt will benefit from additional practice to reinforce carryover.  PATIENT EDUCATION: Education details: Educated on role and purpose of OT as well as potential interventions and goals for therapy based on initial evaluation findings. Person educated: Patient Education method: Customer service manager Education comprehension: verbalized understanding  HOME EXERCISE PROGRAM: TBD   GOALS: Goals reviewed with patient? No  SHORT TERM GOALS: Target date: 09/22/22  Pt will be Independent with PD specific HEP. Baseline: Goal status: INITIAL  2.  Pt will verbalize understanding of adaptive techniques, positioning, and body mechanics to increase independence, ease, and sequencing with ADLs and IADLs. Baseline:  Goal status: INITIAL    3.  Pt will demonstrate increased ease with dressing as evidenced by  decreasing PPT#4(don/ doff jacket) to 12 secs or less Baseline: 16.25 sec Goal status: INITIAL  4.  Pt will demonstrate improved sustained grasp on items to engage in IADLs (emptying dishwasher, setting table, etc.)  Baseline:  Goal status: INITIAL   LONG TERM GOALS: Target date: 10/20/22  Pt will verbalize or demonstrate improvements in speed and efficiency with donning socks and shoes. Baseline:  Goal status: INITIAL  2.  Pt will demonstrate improved ease with feeding as evidenced by decreasing PPT#2 (self feeding) by 3 secs Baseline: 13.57 sec Goal status: INITIAL  3.  Pt will demonstrate improved fine motor coordination for ADLs as evidenced by decreasing 9 hole peg test score for RUE by 3 secs Baseline: Right: 22.10 sec; Left: 26.90 sec Goal status: INITIAL  4.  Pt will demonstrate improved UE functional use for ADLs as evidenced by increasing box/ blocks score by 4 blocks with RUE (hitting barrier </+ to 1 time) Baseline: Right 56 blocks (hitting barrier x4), Left 60 blocks Goal status: INITIAL  5.  Pt will demonstrate ability to engage in dual task IADL activity with good alternating attention to complete simple functional task (simple snack prep, laundry task, etc) without error. Baseline:  Goal status: INITIAL   ASSESSMENT:  CLINICAL IMPRESSION: Patient is a 71 y.o. female who was seen today for occupational therapy evaluation for Parkinson's disease and its impacts on safety and ease with completing ADLs/IADLs. Pt currently lives with spouse and adult son in a 2 story home, pt resides solely on main floor without need to ascend/descend steps. PMHx includes rheumatoid arthritis, depression, and axiety. Pt will benefit from skilled occupational therapy services to address strength and coordination, balance, GM/FM control, cognition, safety awareness, introduction of compensatory strategies/AE prn, and implementation of an HEP to improve participation and safety during ADLs  and IADLs.   PERFORMANCE DEFICITS: in functional skills including ADLs, IADLs, coordination, dexterity, strength, Fine motor control, Gross motor control, mobility, balance, body mechanics, endurance, decreased knowledge of precautions, decreased knowledge of use of DME, and UE functional use, cognitive skills including attention and sequencing, and psychosocial skills including environmental adaptation, habits, and routines and behaviors.   IMPAIRMENTS: are limiting patient from ADLs and IADLs.   CO-MORBIDITIES: may have co-morbidities  that affects occupational performance. Patient will benefit from skilled OT to address above impairments and improve overall function.  MODIFICATION OR ASSISTANCE TO COMPLETE EVALUATION: No modification of tasks or assist necessary to complete an evaluation.  OT OCCUPATIONAL PROFILE AND HISTORY: Problem focused assessment: Including review of records relating to presenting problem.  CLINICAL DECISION MAKING: LOW - limited treatment options, no task modification necessary  REHAB POTENTIAL: Good  EVALUATION  COMPLEXITY: Low    PLAN:  OT FREQUENCY: 1-2x/week  OT DURATION: 8 weeks  PLANNED INTERVENTIONS: self care/ADL training, therapeutic exercise, therapeutic activity, neuromuscular re-education, balance training, functional mobility training, ultrasound, compression bandaging, moist heat, cryotherapy, patient/family education, cognitive remediation/compensation, psychosocial skills training, energy conservation, coping strategies training, and DME and/or AE instructions  RECOMMENDED OTHER SERVICES: NA  CONSULTED AND AGREED WITH PLAN OF CARE: Patient  PLAN FOR NEXT SESSION: Initiate large amplitude movements, engage in dual tasking for cognitive challenge, [place] tasks for ROM and strengthening   Tennyson Kallen, OTR/L 08/22/2022, 9:42 AM

## 2022-08-22 NOTE — Therapy (Signed)
OUTPATIENT SPEECH LANGUAGE PATHOLOGY PARKINSON'S EVALUATION   Patient Name: Kristin [name] MRN: [id number] DOB:1951/02/03, 71 y.o., female Today's Date: 08/22/2022  PCP: Marin Olp MD REFERRING PROVIDER: Alonza Bogus, DO  END OF SESSION:  End of Session - 08/22/22 1311     Visit Number 1    Number of Visits 17    Date for SLP Re-Evaluation 10/31/22    SLP Start Time 0804    SLP Stop Time  0845    SLP Time Calculation (min) 41 min    Activity Tolerance Patient tolerated treatment well             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   Hyperlipidemia    Hypertension    Infertility, female    Low back pain 10/17/2007   Qualifier: Diagnosis of  By: [name], [name]    Measles as a child   Obesity    Osteoarthritis    Parkinson disease    Plantar fasciitis of left foot 06/19/2012   Preventative health care 12/19/2015   Rheumatoid arthritis ([place]) 01/08/2018   Rosacea 10/05/2014   Swallowing difficulty    Urinary frequency 02/25/2013   Visual floaters 05/11/2014   Past Surgical History:  Procedure Laterality Date   ABDOMINAL HYSTERECTOMY  2006   total   esophageal     stretching   HERNIA REPAIR N/A    laporoscopy     LEFT HEART CATH AND CORONARY ANGIOGRAPHY N/A 06/02/2019   Procedure: LEFT HEART CATH AND CORONARY ANGIOGRAPHY;  Surgeon: Jettie Booze, MD;  Location: [facility];   Service: Cardiovascular;  Laterality: N/A;   TONSILLECTOMY     TOTAL HIP ARTHROPLASTY Right 2010   wisdom teeth extracted     Patient Active Problem List   Diagnosis Date Noted   Vitamin B 12 deficiency 05/30/2021   Anemia 03/16/2020   Coronary artery disease involving native coronary artery of native heart without angina pectoris 03/54/6568   Diastolic dysfunction 12/75/1700   Rheumatoid arthritis ([place]) 01/08/2018   Hand pain, right 10/15/2017   Vitamin D deficiency 12/20/2016   Prediabetes 12/20/2016   Shortness of breath on exertion 11/27/2016   Morbid obesity ([place])    Constipation 12/07/2015   BCC (basal cell carcinoma of skin) 10/05/2014   Rosacea 10/05/2014   Parkinson's disease ([place]) 05/11/2014   Screening for cervical cancer 07/17/2012   Foot pain, bilateral 06/19/2012   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   Depression with anxiety 11/02/2009   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   Lumbar back pain with radiculopathy affecting lower extremity 10/17/2007   Essential hypertension 07/05/2007   Osteoarthritis 07/05/2007   Hyperlipidemia 06/26/2007   H/O: iron deficiency anemia 05/08/2007   Hiatal hernia with GERD 05/08/2007    ONSET DATE: Script dated 08-03-22  REFERRING DIAG: Parkinson's Disease  THERAPY DIAG:  Dysarthria and anarthria  Oropharyngeal dysphagia  Rationale for Evaluation and Treatment: Rehabilitation  SUBJECTIVE:   SUBJECTIVE STATEMENT: Pt, just prior to walking out of ST room: "Wow - I'm amazed by what you therapists do." Pt accompanied by: self  PERTINENT HISTORY: See above for PMH. Pt with RA, on methotrexate.   PAIN:  Are you having pain? No  FALLS: Has patient fallen in last 6 months?  No  LIVING ENVIRONMENT: Lives with: lives with their family and lives with their spouse Lives in: House/apartment  PLOF:  Level of assistance: Independent with ADLs Employment: Retired  PATIENT GOALS: Improve communication, and  swallowing if necessary  OBJECTIVE:   DIAGNOSTIC FINDINGS: N/A  COGNITION: Overall cognitive status: Within functional limits for tasks assessed  MOTOR SPEECH: Overall motor speech: impaired Level of impairment: Phrase Respiration: thoracic breathing and speaking on residual capacity Phonation: hoarse and low vocal intensity Resonance: WFL Articulation: Appears intact Intelligibility: Intelligible Motor planning: Appears intact Effective technique: increased vocal intensity  ORAL MOTOR EXAMINATION: Overall status: Impaired:   Labial: Bilateral (ROM) Lingual: Bilateral (ROM) Comments: much better with intense thought, pt admitted   OBJECTIVE VOICE ASSESSMENT: Sustained "ah" maximum phonation time: 11.6 seconds Sustained "ah" loudness average: 74 dB; loud /a/ loudness average: 92 dB Oral reading (passage) loudness average: low 70s dB Conversational loudness average: upper 60s dB Conversational loudness range: 62-75 dB Voice quality: hoarse and low vocal intensity;Stimulability trials: Given SLP modeling and occasional min cues, loudness average increased to low 70sdB (range of 66 to 76) at sentence level.  Comments:  Pt stated "I am exhausted" after talking 45 minutes on the phone with someone. "I almost have to just get off the phone I'm so tired." "Hoarseness"/vocal fry increased in frequency as conversation/evaluation progressed - very notable after 35 minutes.   Completed audio recording of patients baseline voice without cueing from SLP: No  Pt does report difficulty with swallowing which does warrant further evaluation. Bedside swallow eval (BSE) next session  PATIENT REPORTED OUTCOME MEASURES (PROM): EAT-10:   and Communication Effectiveness Survey: to be administered next session  TODAY'S TREATMENT:                                                                                                                                         DATE:  08/22/22: SLP discussed  results of evaluation, sharing that pt's "hoarseness" is very likely due to reduced breath support and that after pt engaged in loud "hey!" Her vocal quality had vastly improved. SLP attributed this to pt's re-familiarization with abdominal engagement when speaking.   PATIENT EDUCATION: Education details: See above in "today's treatment" Person educated: Patient Education method: Explanation, Demonstration, and Verbal cues Education comprehension: verbalized understanding, returned demonstration, verbal cues required, and needs further education  HOME EXERCISE PROGRAM: "Hey" x10/day.   GOALS: Goals reviewed with patient? Yes  SHORT TERM GOALS: Target date:  09/29/22  Pt will undergo bedside swallow evaluation and subsequent MBS if clinically indicated Baseline: Goal status: INITIAL  2.  Pt will demo loud sustained /a/ or "Hey - ahhhhhh" with low 90s dB x4 sessions  Baseline:  Goal status: INITIAL  3.  Pt will correctly acknowledge using abdominal musculature/reduced vocal fry in sentence response tasks x3 sessions Baseline:  Goal status: INITIAL  4.  Pt will demo low 70s dB average in sentence responses in 2 sessions Baseline:  Goal status: INITIAL   LONG TERM GOALS: Target date: 10/31/22  Pt will report higher/better PROM scores than initial session Baseline:  Goal status: INITIAL  2.  Pt will report WNL vocal stamina in conversations >35 minutes between 3 sessions Baseline:  Goal status: INITIAL  3.  Pt will demo WNL volume (low 70s dB average) in 30 minutes conversation with modified independence (external cues PRN) x3 sessions Baseline:  Goal status: INITIAL   ASSESSMENT:  CLINICAL IMPRESSION: Patient is a 71 y.o. female who was seen today for assessment of speech (dysarthria) in light of her Parkinson's Disease. Shakeisha also reports incr'd frequency of sx of dysphagia and will have a BSE next session with subsequent MBSS PRN. She reports she has decr'd vocal stamina  in conversations approx 30-35 minutes in length, and engages in these length conversations primarily on the phone. Secondly she reports "hoarse" voice which SLP suspects is vocal fry, based on results of evaluation today. SLP noted that after loud /a/ and "hey - ahhhh" practice that pt's vocal fry was no longer present in her speaking voice and noted this to pt.   OBJECTIVE IMPAIRMENTS: Objective impairments include dysarthria and dysphagia. These impairments are limiting patient from household responsibilities, ADLs/IADLs, effectively communicating at home and in community, and safety when swallowing.Factors affecting potential to achieve goals and functional outcome are  none .Marland Kitchen Patient will benefit from skilled SLP services to address above impairments and improve overall function.  REHAB POTENTIAL: Good  PLAN:  SLP FREQUENCY: 2x/week  SLP DURATION: 8 weeks or 17 visits  PLANNED INTERVENTIONS: Aspiration precaution training, Pharyngeal strengthening exercises, Diet toleration management , Environmental controls, Trials of upgraded texture/liquids, Cueing hierachy, Internal/external aids, SLP instruction and feedback, Compensatory strategies, and Patient/family education    [facility], [place] 08/22/2022, 1:12 PM

## 2022-08-24 ENCOUNTER — Telehealth: Payer: Self-pay | Admitting: Gastroenterology

## 2022-08-24 NOTE — Telephone Encounter (Signed)
Ok to do bowel purge with Miralax and increase dose of Linzess to 275mg daily. Please schedule office visit next available. Thanks

## 2022-08-24 NOTE — Telephone Encounter (Signed)
Inbound cal from patient stating she is experiencing chronic constipation.  Patient is inquiring if a medication can be prescribed and sent into the pharmacy.  Symptoms has been going on for 2 weeks.  Patient is going out of town. Please advise.  Thank you

## 2022-08-24 NOTE — Telephone Encounter (Signed)
Instructed the patient on the purge. She will double up on her Linzess to equal 290 mcg. Appointment 10/05/22. She will call me before she is out of medication. She will need a new Linzess prescription for 290 mcg daily.

## 2022-08-24 NOTE — Telephone Encounter (Signed)
Spoke with the patient. She reports increase of her constipation despite daily Linzess. She does not feel bloated or have any "rumbling or gas" yet she is only going to the bathroom about every other day at best. She does feel she needs to purge and start over. She is concerned that she does not have any symptoms. What do you recommend?

## 2022-08-29 DIAGNOSIS — M25571 Pain in right ankle and joints of right foot: Secondary | ICD-10-CM | POA: Diagnosis not present

## 2022-08-30 ENCOUNTER — Other Ambulatory Visit: Payer: Self-pay | Admitting: Family Medicine

## 2022-08-31 DIAGNOSIS — M25571 Pain in right ankle and joints of right foot: Secondary | ICD-10-CM | POA: Diagnosis not present

## 2022-09-01 ENCOUNTER — Ambulatory Visit: Payer: Medicare Other

## 2022-09-01 DIAGNOSIS — R471 Dysarthria and anarthria: Secondary | ICD-10-CM

## 2022-09-01 DIAGNOSIS — R2681 Unsteadiness on feet: Secondary | ICD-10-CM | POA: Diagnosis not present

## 2022-09-01 DIAGNOSIS — R1312 Dysphagia, oropharyngeal phase: Secondary | ICD-10-CM

## 2022-09-01 DIAGNOSIS — M6281 Muscle weakness (generalized): Secondary | ICD-10-CM | POA: Diagnosis not present

## 2022-09-01 DIAGNOSIS — R293 Abnormal posture: Secondary | ICD-10-CM | POA: Diagnosis not present

## 2022-09-01 DIAGNOSIS — R278 Other lack of coordination: Secondary | ICD-10-CM | POA: Diagnosis not present

## 2022-09-01 DIAGNOSIS — R29818 Other symptoms and signs involving the nervous system: Secondary | ICD-10-CM | POA: Diagnosis not present

## 2022-09-01 NOTE — Therapy (Signed)
OUTPATIENT SPEECH LANGUAGE PATHOLOGY [facility]   Patient Name: Kristin [name] MRN: [id number] DOB:02-Oct-1950, 71 y.o., female Today's Date: 09/01/2022  PCP: Marin Olp MD REFERRING PROVIDER: Alonza Bogus, DO  END OF SESSION:  End of Session - 09/01/22 2330     Visit Number 2    Number of Visits 17    Date for SLP Re-Evaluation 10/31/22    SLP Start Time 8    SLP Stop Time  5102    SLP Time Calculation (min) 43 min    Activity Tolerance Patient tolerated treatment well              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   Hyperlipidemia    Hypertension    Infertility, female    Low back pain 10/17/2007   Qualifier: Diagnosis of  By: [name], [name]    Measles as a child   Obesity    Osteoarthritis    Parkinson disease    Plantar fasciitis of left foot 06/19/2012   Preventative health care 12/19/2015   Rheumatoid arthritis ([place]) 01/08/2018   Rosacea 10/05/2014   Swallowing difficulty    Urinary frequency 02/25/2013   Visual floaters 05/11/2014   Past Surgical History:  Procedure Laterality Date   ABDOMINAL HYSTERECTOMY  2006   total   esophageal     stretching   HERNIA REPAIR N/A    laporoscopy     LEFT HEART CATH AND CORONARY ANGIOGRAPHY N/A 06/02/2019   Procedure: LEFT HEART CATH AND CORONARY ANGIOGRAPHY;  Surgeon: Jettie Booze, MD;  Location: [facility];   Service: Cardiovascular;  Laterality: N/A;   TONSILLECTOMY     TOTAL HIP ARTHROPLASTY Right 2010   wisdom teeth extracted     Patient Active Problem List   Diagnosis Date Noted   Vitamin B 12 deficiency 05/30/2021   Anemia 03/16/2020   Coronary artery disease involving native coronary artery of native heart without angina pectoris 58/52/7782   Diastolic dysfunction 42/35/3614   Rheumatoid arthritis ([place]) 01/08/2018   Hand pain, right 10/15/2017   Vitamin D deficiency 12/20/2016   Prediabetes 12/20/2016   Shortness of breath on exertion 11/27/2016   Morbid obesity ([place])    Constipation 12/07/2015   BCC (basal cell carcinoma of skin) 10/05/2014   Rosacea 10/05/2014   Parkinson's disease ([place]) 05/11/2014   Screening for cervical cancer 07/17/2012   Foot pain, bilateral 06/19/2012   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   Depression with anxiety 11/02/2009   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   Lumbar back pain with radiculopathy affecting lower extremity 10/17/2007   Essential hypertension 07/05/2007   Osteoarthritis 07/05/2007   Hyperlipidemia 06/26/2007   H/O: iron deficiency anemia 05/08/2007   Hiatal hernia with GERD 05/08/2007    ONSET DATE: Script dated 08-03-22  REFERRING DIAG: Parkinson's Disease  THERAPY DIAG:  Dysarthria and anarthria  Oropharyngeal dysphagia  Rationale for Evaluation and Treatment: Rehabilitation  SUBJECTIVE:   SUBJECTIVE STATEMENT: "I know one place (where I feel communication is especially difficult) - on the phone." Pt accompanied by: self  PERTINENT HISTORY: See above for PMH. Pt with RA, on methotrexate.   PAIN:  Are you having pain? No  FALLS: Has patient fallen in last 6 months?  No  LIVING ENVIRONMENT: Lives with: lives with their family and lives with their spouse Lives in: House/apartment  PLOF:  Level of assistance: Independent with ADLs Employment: Retired  PATIENT GOALS: Improve communication, and  swallowing if necessary  OBJECTIVE:    Pt does report difficulty with swallowing which does warrant further evaluation. Bedside swallow eval (BSE) week of 09/05/22  PATIENT REPORTED OUTCOME MEASURES (PROM): EAT-10:   and Communication Effectiveness Survey: to be administered next session  TODAY'S TREATMENT:                                                                                                                                         DATE:  09/01/22: Pt arrived with WNL volume in simple to mod=complex conversation - for approx 7 minutes and then conversation reverted to sub-WNL until SLP told pt and then she incr'd volume to WNL for another 5 minutes. SLP assisted pt with proper vocal production with "hey" stressing abdominal recruitment. Pt was approx 95-90% successful and was able to ID strain from vocal folds instead of abdominal use. 10 loud "hey" BID was prescribed. Pt also to arrive with 10 everyday sentences next session. SLP highlighted pt's habitual throat clearing and encouraged a small sip of water with a strong swallow. Pt self-cued after a throat clear multiple (x3-4) during the remainder of the session.   Pt was explained rationale for CES and asked to return it next session. Bedside/clinical swallow evaluation next session.  08/22/22: SLP discussed results of evaluation, sharing that pt's "hoarseness" is very likely due to reduced breath support and that after pt engaged in loud "hey!" Her vocal quality had vastly improved. SLP attributed this to pt's re-familiarization with abdominal engagement when speaking.   PATIENT EDUCATION: Education details: See above in "today's treatment" Person educated: Patient Education method: Explanation, Demonstration, and Verbal cues Education comprehension: verbalized understanding, returned demonstration, verbal cues required, and needs further education  HOME EXERCISE PROGRAM: "Hey" x10/day.   GOALS: Goals reviewed with patient?  Yes  SHORT TERM GOALS: Target date: 09/29/22  Pt will undergo bedside swallow evaluation and subsequent MBS if clinically indicated Baseline: Goal status: Ongoing  2.  Pt will demo loud sustained /a/ or "Hey - ahhhhhh" with low 90s dB x4 sessions  Baseline:  Goal status: Ongoing  3.  Pt will correctly acknowledge using abdominal musculature/reduced vocal fry in sentence response tasks x3 sessions Baseline:  Goal status: Ongoing  4.  Pt will demo low 70s  dB average in sentence responses in 2 sessions Baseline:  Goal status: Ongoing   LONG TERM GOALS: Target date: 10/31/22  Pt will report higher/better PROM scores than initial session Baseline:  Goal status: Ongoing  2.  Pt will report WNL vocal stamina in conversations >35 minutes between 3 sessions Baseline:  Goal status: Ongoing  3.  Pt will demo WNL volume (low 70s dB average) in 30 minutes conversation with modified independence (external cues PRN) x3 sessions Baseline:  Goal status: Ongoing   ASSESSMENT:  CLINICAL IMPRESSION: Patient is a 71 y.o. female who was seen today for treatment of speech (dysarthria) in light of her Parkinson's Disease. Lucilla also reports incr'd frequency of sx of dysphagia and will have a BSE during the second ST session with subsequent MBSS PRN. She reports she has decr'd vocal stamina in conversations approx 30-35 minutes in length, and engages in these length conversations primarily on the phone. Secondly she reports "hoarse" voice which SLP suspects is vocal fry, based on results of evaluation today. SLP noted that after loud /a/ and "hey - ahhhh" practice that pt's vocal fry was no longer present in her speaking voice and noted this to pt.   OBJECTIVE IMPAIRMENTS: Objective impairments include dysarthria and dysphagia. These impairments are limiting patient from household responsibilities, ADLs/IADLs, effectively communicating at home and in community, and safety when swallowing.Factors  affecting potential to achieve goals and functional outcome are  none .Marland Kitchen Patient will benefit from skilled SLP services to address above impairments and improve overall function.  REHAB POTENTIAL: Good  PLAN:  SLP FREQUENCY: 2x/week  SLP DURATION: 8 weeks or 17 visits  PLANNED INTERVENTIONS: Aspiration precaution training, Pharyngeal strengthening exercises, Diet toleration management , Environmental controls, Trials of upgraded texture/liquids, Cueing hierachy, Internal/external aids, SLP instruction and feedback, Compensatory strategies, and Patient/family education    [facility], [place] 09/01/2022, 11:30 PM

## 2022-09-05 ENCOUNTER — Ambulatory Visit: Payer: Medicare Other | Admitting: Occupational Therapy

## 2022-09-06 ENCOUNTER — Ambulatory Visit: Payer: Medicare Other | Attending: Neurology

## 2022-09-06 DIAGNOSIS — M6281 Muscle weakness (generalized): Secondary | ICD-10-CM | POA: Diagnosis not present

## 2022-09-06 DIAGNOSIS — R471 Dysarthria and anarthria: Secondary | ICD-10-CM | POA: Diagnosis not present

## 2022-09-06 DIAGNOSIS — R1312 Dysphagia, oropharyngeal phase: Secondary | ICD-10-CM | POA: Diagnosis not present

## 2022-09-06 DIAGNOSIS — R1310 Dysphagia, unspecified: Secondary | ICD-10-CM | POA: Insufficient documentation

## 2022-09-06 DIAGNOSIS — R29818 Other symptoms and signs involving the nervous system: Secondary | ICD-10-CM | POA: Diagnosis not present

## 2022-09-06 DIAGNOSIS — R278 Other lack of coordination: Secondary | ICD-10-CM | POA: Diagnosis not present

## 2022-09-06 NOTE — Therapy (Signed)
OUTPATIENT SPEECH LANGUAGE PATHOLOGY [facility]   Patient Name: Kristin [name] MRN: [id number] DOB:[birth year], 72 y.o., female Today's Date: 09/06/2022  PCP: Marin Olp MD REFERRING PROVIDER: Alonza Bogus, DO  END OF SESSION:  End of Session - 09/06/22 2258     Visit Number 3    Number of Visits 17    Date for SLP Re-Evaluation 10/31/22    SLP Start Time 81    SLP Stop Time  8280    SLP Time Calculation (min) 39 min    Activity Tolerance Patient tolerated treatment well               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   Hyperlipidemia    Hypertension    Infertility, female    Low back pain 10/17/2007   Qualifier: Diagnosis of  By: [name], [name]    Measles as a child   Obesity    Osteoarthritis    Parkinson disease    Plantar fasciitis of left foot 06/19/2012   Preventative health care 12/19/2015   Rheumatoid arthritis ([place]) 01/08/2018   Rosacea 10/05/2014   Swallowing difficulty    Urinary frequency 02/25/2013   Visual floaters 05/11/2014   Past Surgical History:  Procedure Laterality Date   ABDOMINAL HYSTERECTOMY  2006   total   esophageal     stretching   HERNIA REPAIR N/A    laporoscopy     LEFT HEART CATH AND CORONARY ANGIOGRAPHY N/A 06/02/2019   Procedure: LEFT HEART CATH AND CORONARY ANGIOGRAPHY;  Surgeon: Jettie Booze, MD;  Location: [facility];   Service: Cardiovascular;  Laterality: N/A;   TONSILLECTOMY     TOTAL HIP ARTHROPLASTY Right 2010   wisdom teeth extracted     Patient Active Problem List   Diagnosis Date Noted   Vitamin B 12 deficiency 05/30/2021   Anemia 03/16/2020   Coronary artery disease involving native coronary artery of native heart without angina pectoris 03/49/1791   Diastolic dysfunction 50/56/9794   Rheumatoid arthritis ([place]) 01/08/2018   Hand pain, right 10/15/2017   Vitamin D deficiency 12/20/2016   Prediabetes 12/20/2016   Shortness of breath on exertion 11/27/2016   Morbid obesity ([place])    Constipation 12/07/2015   BCC (basal cell carcinoma of skin) 10/05/2014   Rosacea 10/05/2014   Parkinson's disease ([place]) 05/11/2014   Screening for cervical cancer 07/17/2012   Foot pain, bilateral 06/19/2012   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  Rationale for Evaluation and Treatment: Rehabilitation  SUBJECTIVE:   SUBJECTIVE STATEMENT: "I don't eat meat (now) - it  hasn't choked me, but I feel like it might." Pt accompanied by: self  PERTINENT HISTORY: See above for PMH. Pt with RA, on methotrexate.   PAIN:  Are you having pain? No  FALLS: Has patient fallen in last 6 months?  No  LIVING ENVIRONMENT: Lives with: lives with their family and lives with their spouse Lives in: House/apartment  PLOF:  Level of assistance: Independent with ADLs Employment: Retired  PATIENT GOALS: Improve communication, and swallowing if necessary  OBJECTIVE:    Pt does  report difficulty with swallowing which does warrant further evaluation. Bedside swallow eval (BSE) week of 09/05/22  PATIENT REPORTED OUTCOME MEASURES (PROM): Communication Effectiveness Survey: pt scored 16/32 (higher scores indicate better QOL) with communicating across a room and over the telephone being "not at all effective".  EAT-10: 13/40 (lower scores indicate better QOL.Pt rated 3/4 (Notable problem) with swallowing solids takes extra effort, swallowing pills takes extra effort, and swallowing is stressful.  TODAY'S TREATMENT:                                                                                                                                         DATE:  09/06/22: Bedside/clinical swallow eval. See initial eval for oral-motor assessment. Pt reports she does not really eat meat as much due to a little anxiety about it choking her. She endorsed taking smaller bites and sips in the last 2-3 months, compared to prior to this. Pt told SLP she is taking more care and caution due to fear of choking, with meat, and has almost ceased eating it. SLP had pt add 10 effortful swallows BID to begin to work on swallow strength. If not necessary after MBS, will d/c this exercise. CLINICAL SWALLOW ASSESSMENT:   Current diet: regular, thin liquids, and current diet modifications: pt does not eat entrees of meat anymore due to fear of choking. Will have meat in a dish, but not entrees. Dentition: adequate natural dentition Patient directly observed with POs: Yes: regular, dysphagia 3 (soft), dysphagia 1 (puree), and thin liquids  Feeding: able to feed self Liquids provided by: cup Oral phase signs and symptoms: prolonged mastication and prolonged bolus formation Pharyngeal phase signs and symptoms: none noted  09/01/22: Pt arrived with WNL volume in simple to mod=complex conversation - for approx 7 minutes and then conversation reverted to sub-WNL until SLP told pt and then she incr'd volume to WNL  for another 5 minutes. SLP assisted pt with proper vocal production with "hey" stressing abdominal recruitment. Pt was approx 95-90% successful and was able to ID strain from vocal folds instead of abdominal use. 10 loud "hey" BID was prescribed. Pt also to arrive with 10 everyday sentences next session. SLP highlighted pt's habitual  throat clearing and encouraged a small sip of water with a strong swallow. Pt self-cued after a throat clear multiple (x3-4) during the remainder of the session.   Pt was explained rationale for CES and asked to return it next session. Bedside/clinical swallow evaluation next session.  08/22/22: SLP discussed results of evaluation, sharing that pt's "hoarseness" is very likely due to reduced breath support and that after pt engaged in loud "hey!" Her vocal quality had vastly improved. SLP attributed this to pt's re-familiarization with abdominal engagement when speaking.   PATIENT EDUCATION: Education details: See above in "today's treatment" Person educated: Patient Education method: Explanation, Demonstration, and Verbal cues Education comprehension: verbalized understanding, returned demonstration, verbal cues required, and needs further education  HOME EXERCISE PROGRAM: "Hey" x10/day.   GOALS: Goals reviewed with patient? Yes  SHORT TERM GOALS: Target date: 09/29/22  Pt will undergo bedside swallow evaluation and subsequent MBS if clinically indicated Baseline: Goal status: Met  2.  Pt will demo loud sustained /a/ or "Hey - ahhhhhh" with low 90s dB x4 sessions  Baseline:  Goal status: Ongoing  3.  Pt will correctly acknowledge using abdominal musculature/reduced vocal fry in sentence response tasks x3 sessions Baseline:  Goal status: Ongoing  4.  Pt will demo low 70s dB average in sentence responses in 2 sessions Baseline:  Goal status: Ongoing   LONG TERM GOALS: Target date: 10/31/22  Pt will report higher/better PROM scores than initial  session Baseline:  Goal status: Ongoing  2.  Pt will report WNL vocal stamina in conversations >35 minutes between 3 sessions Baseline:  Goal status: Ongoing  3.  Pt will demo WNL volume (low 70s dB average) in 30 minutes conversation with modified independence (external cues PRN) x3 sessions Baseline:  Goal status: Ongoing   ASSESSMENT:  CLINICAL IMPRESSION: Patient is a 72 y.o. female who is being seen for treatment of speech (dysarthria) in light of her Parkinson's Disease. Collette also reports incr'd frequency of sx of dysphagia and a BSE today resulted in SLP suggesting MBSS, which was sent today for scheduling. She has decr'd vocal stamina in conversations approx 30-35 minutes in length, and engages in these length conversations primarily on the phone. Secondly she reports "hoarse" voice which SLP suspects is vocal fry.  OBJECTIVE IMPAIRMENTS: Objective impairments include dysarthria and dysphagia. These impairments are limiting patient from household responsibilities, ADLs/IADLs, effectively communicating at home and in community, and safety when swallowing.Factors affecting potential to achieve goals and functional outcome are  none .Marland Kitchen Patient will benefit from skilled SLP services to address above impairments and improve overall function.  REHAB POTENTIAL: Good  PLAN:  SLP FREQUENCY: 2x/week  SLP DURATION: 8 weeks or 17 visits  PLANNED INTERVENTIONS: Aspiration precaution training, Pharyngeal strengthening exercises, Diet toleration management , Environmental controls, Trials of upgraded texture/liquids, Cueing hierachy, Internal/external aids, SLP instruction and feedback, Compensatory strategies, and Patient/family education    [facility], [place] 09/06/2022, 10:59 PM

## 2022-09-07 ENCOUNTER — Telehealth (HOSPITAL_COMMUNITY): Payer: Self-pay

## 2022-09-07 NOTE — Telephone Encounter (Signed)
Attempted to contact patient to schedule Modified Barium Swallow - left voicemail. 

## 2022-09-08 DIAGNOSIS — M25571 Pain in right ankle and joints of right foot: Secondary | ICD-10-CM | POA: Diagnosis not present

## 2022-09-11 ENCOUNTER — Ambulatory Visit: Payer: Medicare Other

## 2022-09-11 DIAGNOSIS — R1310 Dysphagia, unspecified: Secondary | ICD-10-CM | POA: Diagnosis not present

## 2022-09-11 DIAGNOSIS — R1312 Dysphagia, oropharyngeal phase: Secondary | ICD-10-CM

## 2022-09-11 DIAGNOSIS — R278 Other lack of coordination: Secondary | ICD-10-CM | POA: Diagnosis not present

## 2022-09-11 DIAGNOSIS — R471 Dysarthria and anarthria: Secondary | ICD-10-CM | POA: Diagnosis not present

## 2022-09-11 DIAGNOSIS — M6281 Muscle weakness (generalized): Secondary | ICD-10-CM | POA: Diagnosis not present

## 2022-09-11 DIAGNOSIS — R29818 Other symptoms and signs involving the nervous system: Secondary | ICD-10-CM | POA: Diagnosis not present

## 2022-09-11 NOTE — Therapy (Signed)
OUTPATIENT SPEECH LANGUAGE PATHOLOGY [facility]   Patient Name: Kristin [name] MRN: [id number] DOB:1950/10/21, 72 y.o., female Today's Date: 09/12/2022  PCP: Marin Olp MD REFERRING PROVIDER: Alonza Bogus, DO  END OF SESSION:  End of Session - 09/12/22 0003     Visit Number 4    Number of Visits 17    Date for SLP Re-Evaluation 10/31/22    SLP Start Time 1320    SLP Stop Time  1400    SLP Time Calculation (min) 40 min    Activity Tolerance Patient tolerated treatment well                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   Hyperlipidemia    Hypertension    Infertility, female    Low back pain 10/17/2007   Qualifier: Diagnosis of  By: [name], [name]    Measles as a child   Obesity    Osteoarthritis    Parkinson disease    Plantar fasciitis of left foot 06/19/2012   Preventative health care 12/19/2015   Rheumatoid arthritis ([place]) 01/08/2018   Rosacea 10/05/2014   Swallowing difficulty    Urinary frequency 02/25/2013   Visual floaters 05/11/2014   Past Surgical History:  Procedure Laterality Date   ABDOMINAL HYSTERECTOMY  2006   total   esophageal     stretching   HERNIA REPAIR N/A    laporoscopy     LEFT HEART CATH AND CORONARY ANGIOGRAPHY N/A 06/02/2019   Procedure: LEFT HEART CATH AND CORONARY ANGIOGRAPHY;  Surgeon: Jettie Booze, MD;  Location: [facility];   Service: Cardiovascular;  Laterality: N/A;   TONSILLECTOMY     TOTAL HIP ARTHROPLASTY Right 2010   wisdom teeth extracted     Patient Active Problem List   Diagnosis Date Noted   Vitamin B 12 deficiency 05/30/2021   Anemia 03/16/2020   Coronary artery disease involving native coronary artery of native heart without angina pectoris 95/05/3266   Diastolic dysfunction 12/45/8099   Rheumatoid arthritis ([place]) 01/08/2018   Hand pain, right 10/15/2017   Vitamin D deficiency 12/20/2016   Prediabetes 12/20/2016   Shortness of breath on exertion 11/27/2016   Morbid obesity ([place])    Constipation 12/07/2015   BCC (basal cell carcinoma of skin) 10/05/2014   Rosacea 10/05/2014   Parkinson's disease ([place]) 05/11/2014   Screening for cervical cancer 07/17/2012   Foot pain, bilateral 06/19/2012   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   Depression with anxiety 11/02/2009   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   Lumbar back pain with radiculopathy affecting lower extremity 10/17/2007   Essential hypertension 07/05/2007   Osteoarthritis 07/05/2007   Hyperlipidemia 06/26/2007   H/O: iron deficiency anemia 05/08/2007   Hiatal hernia with GERD 05/08/2007    ONSET DATE: Script dated 08-03-22  REFERRING DIAG:  Parkinson's Disease  THERAPY DIAG:  Dysarthria and anarthria  Oropharyngeal dysphagia  Rationale for Evaluation and Treatment: Rehabilitation  SUBJECTIVE:   SUBJECTIVE STATEMENT: "I practiced my talking this morning. Every time I said something I made it louder." Pt accompanied by: self  PERTINENT HISTORY: See above for PMH. Pt with RA, on methotrexate.   PAIN:  Are you having pain? No  FALLS: Has patient fallen in last 6 months?  No  LIVING ENVIRONMENT: Lives with: lives with their family and lives with their spouse Lives in: House/apartment  PLOF:  Level of assistance: Independent with ADLs Employment: Retired  PATIENT GOALS: Improve communication, and swallowing  if necessary  OBJECTIVE:   [place] (PROM): Communication Effectiveness Survey: pt scored 16/32 (higher scores indicate better QOL) with communicating across a room and over the telephone being "not at all effective".  EAT-10: 13/40 (lower scores indicate better QOL.Pt rated 3/4 (Notable problem) with swallowing solids takes extra effort, swallowing pills takes extra effort, and swallowing is stressful.  TODAY'S TREATMENT:                                                                                                                                         DATE:  09/11/22: Pt has not practiced to recommended frequency. [name] said she will do better with this for next session. Loud "hey" with low 90s dB with some pressed/tight/squeezed voice - pt correctly Id'd this with min A from SLP but then Id'd independently after this. SLP used "hey" and "ah" together to attempt to foster relaxed /a/ but pt unable to completely perform in order to perform at home at this time. Everyday sentences produced mid 80s dB with initial cues necessary for breath and when to initiate voicing. Waneda had 80% success with WNL volume when reading a phrase and then providing an answer. Homework was provided for this until next session. SLP stressed BID completion of HEP and of homework.  09/06/22: Bedside/clinical swallow eval. See initial eval for oral-motor assessment. Pt reports she does not really eat meat as much due to a little anxiety about it choking her. She endorsed taking smaller bites and sips in the last 2-3 months, compared to prior to this. Pt told SLP she is taking more care and caution due to fear of choking, with meat, and has almost ceased eating it. SLP had pt add 10 effortful swallows BID to begin to work on swallow strength. If not necessary after MBS, will d/c this exercise. CLINICAL SWALLOW ASSESSMENT:   Current diet: regular, thin liquids, and current diet modifications: pt does not  eat entrees of meat anymore due to fear of choking. Will have meat in a dish, but not entrees. Dentition: adequate natural dentition Patient directly observed with POs: Yes: regular, dysphagia 3 (soft), dysphagia 1 (puree), and thin liquids  Feeding: able to feed self Liquids provided by: cup Oral  phase signs and symptoms: prolonged mastication and prolonged bolus formation Pharyngeal phase signs and symptoms: none noted  09/01/22: Pt arrived with WNL volume in simple to mod=complex conversation - for approx 7 minutes and then conversation reverted to sub-WNL until SLP told pt and then she incr'd volume to WNL for another 5 minutes. SLP assisted pt with proper vocal production with "hey" stressing abdominal recruitment. Pt was approx 95-90% successful and was able to ID strain from vocal folds instead of abdominal use. 10 loud "hey" BID was prescribed. Pt also to arrive with 10 everyday sentences next session. SLP highlighted pt's habitual throat clearing and encouraged a small sip of water with a strong swallow. Pt self-cued after a throat clear multiple (x3-4) during the remainder of the session.   Pt was explained rationale for CES and asked to return it next session. Bedside/clinical swallow evaluation next session.  08/22/22: SLP discussed results of evaluation, sharing that pt's "hoarseness" is very likely due to reduced breath support and that after pt engaged in loud "hey!" Her vocal quality had vastly improved. SLP attributed this to pt's re-familiarization with abdominal engagement when speaking.   PATIENT EDUCATION: Education details: See above in "today's treatment" Person educated: Patient Education method: Explanation, Demonstration, and Verbal cues Education comprehension: verbalized understanding, returned demonstration, verbal cues required, and needs further education  HOME EXERCISE PROGRAM: "Hey" x10/day.   GOALS: Goals reviewed with patient? Yes  SHORT TERM GOALS:  Target date: 09/29/22  Pt will undergo bedside swallow evaluation and subsequent MBS if clinically indicated Baseline: Goal status: Met  2.  Pt will demo loud sustained /a/ or "Hey - ahhhhhh" with low 90s dB x4 sessions  Baseline: 09-11-22 Goal status: Ongoing  3.  Pt will correctly acknowledge using abdominal musculature/reduced vocal fry in sentence response tasks x3 sessions Baseline:  Goal status: Ongoing  4.  Pt will demo low 70s dB average in sentence responses in 2 sessions Baseline:  Goal status: Ongoing   LONG TERM GOALS: Target date: 10/31/22  Pt will report higher/better PROM scores than initial session Baseline:  Goal status: Ongoing  2.  Pt will report WNL vocal stamina in conversations >35 minutes between 3 sessions Baseline:  Goal status: Ongoing  3.  Pt will demo WNL volume (low 70s dB average) in 30 minutes conversation with modified independence (external cues PRN) x3 sessions Baseline:  Goal status: Ongoing   ASSESSMENT:  CLINICAL IMPRESSION: Patient is a 72 y.o. female who is being seen for treatment of speech (dysarthria) in light of her Parkinson's Disease. Misbah also reports incr'd frequency of sx of dysphagia and a BSE today resulted in SLP suggesting MBSS, which was sent today for scheduling. She has decr'd vocal stamina in conversations approx 30-35 minutes in length, and engages in these length conversations primarily on the phone. Secondly she reports "hoarse" voice which SLP suspects is vocal fry.  OBJECTIVE IMPAIRMENTS: Objective impairments include dysarthria and dysphagia. These impairments are limiting patient from household responsibilities, ADLs/IADLs, effectively communicating at home and in community, and safety when swallowing.Factors affecting potential to achieve goals and functional outcome are  none .Marland Kitchen Patient will benefit from skilled SLP services to address above impairments and improve overall function.  REHAB POTENTIAL:  Good  PLAN:  SLP FREQUENCY: 2x/week  SLP DURATION: 8 weeks or 17 visits  PLANNED INTERVENTIONS: Aspiration precaution training, Pharyngeal strengthening exercises, Diet toleration management , Environmental controls, Trials of upgraded texture/liquids, Cueing hierachy, Internal/external aids, SLP instruction and feedback, Compensatory strategies, and  Patient/family education    [facility], [place] 09/12/2022, 12:03 AM

## 2022-09-12 NOTE — Therapy (Incomplete)
OUTPATIENT SPEECH LANGUAGE PATHOLOGY [facility]   Patient Name: Kristin [name] MRN: [id number] DOB:May 09, 1951, 72 y.o., female Today's Date: 09/12/2022  PCP: Kristin Olp MD REFERRING PROVIDER: Alonza Bogus, DO  END OF SESSION:  End of Session - 09/12/22 0003     Visit Number 4    Number of Visits 17    Date for SLP Re-Evaluation 10/31/22    SLP Start Time 1320    SLP Stop Time  1400    SLP Time Calculation (min) 40 min    Activity Tolerance Patient tolerated treatment well                Past Medical History:  Diagnosis Date  . Abnormal vaginal Pap smear   . Acute pharyngitis 02/25/2013  . Anemia   . Anxiety   . Arthritis   . 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 Chicken pox as a child  . Chronic UTI    sees dr Kristin [name]  . Constipation 12/07/2015  . Depression with anxiety 11/02/2009   Qualifier: Diagnosis of  By: Kristin Norman, LPN, Kristin [name]   . Dermatitis 07/17/2012  . Esophageal stricture 1994  . Fibroids   . Foot pain, bilateral 06/19/2012  . GERD (gastroesophageal reflux disease)   . Hiatal hernia   . Hyperglycemia 08/19/2013  . 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 Hyperlipidemia   . Hypertension   . Infertility, female   . Low back pain 10/17/2007   Qualifier: Diagnosis of  By: Kristin Morale MD, Kristin [name]   . Measles as a child  . Obesity   . Osteoarthritis   . Parkinson disease   . Plantar fasciitis of left foot 06/19/2012  . Preventative health care 12/19/2015  . Rheumatoid arthritis ([place]) 01/08/2018  . Rosacea 10/05/2014  . Swallowing difficulty   . Urinary frequency 02/25/2013  . Visual floaters 05/11/2014   Past Surgical History:  Procedure Laterality Date  . ABDOMINAL HYSTERECTOMY  2006   total  . esophageal     stretching  . HERNIA REPAIR N/A   . laporoscopy    . LEFT HEART CATH AND CORONARY ANGIOGRAPHY N/A 06/02/2019   Procedure: LEFT HEART CATH AND CORONARY ANGIOGRAPHY;  Surgeon: Kristin Booze, MD;  Location: [facility];  Service: Cardiovascular;  Laterality: N/A;  . TONSILLECTOMY    . TOTAL HIP ARTHROPLASTY Right 2010  . wisdom teeth extracted     Patient Active Problem List   Diagnosis Date Noted  . Vitamin B 12 deficiency 05/30/2021  . Anemia 03/16/2020  . Coronary artery disease involving native coronary artery of native heart without angina pectoris 06/12/2019  . Diastolic dysfunction 40/98/1191  . Rheumatoid arthritis ([place]) 01/08/2018  . Hand pain, right 10/15/2017  . Vitamin D deficiency 12/20/2016  . Prediabetes 12/20/2016  . Shortness of breath on exertion 11/27/2016  . Morbid obesity ([place])   . Constipation 12/07/2015  . BCC (basal cell carcinoma of skin) 10/05/2014  . Rosacea 10/05/2014  . Parkinson's disease ([place]) 05/11/2014  . Screening for cervical cancer 07/17/2012  . Foot pain, bilateral 06/19/2012  . 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 Depression with anxiety 11/02/2009  . 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 Lumbar back pain with radiculopathy affecting lower extremity 10/17/2007  . Essential hypertension 07/05/2007  . Osteoarthritis 07/05/2007  . Hyperlipidemia 06/26/2007  . H/O: iron deficiency anemia 05/08/2007  . Hiatal hernia with GERD 05/08/2007    ONSET DATE: Script dated 08-03-22  REFERRING DIAG:  Parkinson's Disease  THERAPY DIAG:  Dysarthria and anarthria  Oropharyngeal dysphagia  Rationale for Evaluation and Treatment: Rehabilitation  SUBJECTIVE:   SUBJECTIVE STATEMENT: "I practiced my talking this morning. Every time I said something I made it louder." Pt accompanied by: self  PERTINENT HISTORY: See above for PMH. Pt with RA, on methotrexate.   PAIN:  Are you having pain? No  FALLS: Has patient fallen in last 6 months?  No  LIVING ENVIRONMENT: Lives with: lives with their family and lives with their spouse Lives in: House/apartment  PLOF:  Level of assistance: Independent with  ADLs Employment: Retired  PATIENT GOALS: Improve communication, and swallowing if necessary  OBJECTIVE:   [place] (PROM): Communication Effectiveness Survey: pt scored 16/32 (higher scores indicate better QOL) with communicating across a room and over the telephone being "not at all effective".  EAT-10: 13/40 (lower scores indicate better QOL.Pt rated 3/4 (Notable problem) with swallowing solids takes extra effort, swallowing pills takes extra effort, and swallowing is stressful.  TODAY'S TREATMENT:                                                                                                                                         DATE:  09/11/22: Pt has not practiced to recommended frequency. [name] said she will do better with this for next session. Loud "hey" with low 90s dB with some pressed/tight/squeezed voice - pt correctly Id'd this with min A from SLP but then Id'd independently after this. SLP used "hey" and "ah" together to attempt to foster relaxed /a/ but pt unable to completely perform in order to perform at home at this time. Everyday sentences produced mid 80s dB with initial cues necessary for breath and when to initiate voicing.   09/06/22: Bedside/clinical swallow eval. See initial eval for oral-motor assessment. Pt reports she does not really eat meat as much due to a little anxiety about it choking her. She endorsed taking smaller bites and sips in the last 2-3 months, compared to prior to this. Pt told SLP she is taking more care and caution due to fear of choking, with meat, and has almost ceased eating it. SLP had pt add 10 effortful swallows BID to begin to work on swallow strength. If not necessary after MBS, will d/c this exercise. CLINICAL SWALLOW ASSESSMENT:   Current diet: regular, thin liquids, and current diet modifications: pt does not eat entrees of meat anymore due to fear of choking. Will have meat in a dish, but not entrees. Dentition:  adequate natural dentition Patient directly observed with POs: Yes: regular, dysphagia 3 (soft), dysphagia 1 (puree), and thin liquids  Feeding: able to feed self Liquids provided by: cup Oral phase signs and symptoms: prolonged mastication and prolonged bolus formation Pharyngeal phase signs and symptoms: none noted  09/01/22: Pt arrived with WNL volume in simple to mod=complex conversation - for approx  7 minutes and then conversation reverted to sub-WNL until SLP told pt and then she incr'd volume to WNL for another 5 minutes. SLP assisted pt with proper vocal production with "hey" stressing abdominal recruitment. Pt was approx 95-90% successful and was able to ID strain from vocal folds instead of abdominal use. 10 loud "hey" BID was prescribed. Pt also to arrive with 10 everyday sentences next session. SLP highlighted pt's habitual throat clearing and encouraged a small sip of water with a strong swallow. Pt self-cued after a throat clear multiple (x3-4) during the remainder of the session.   Pt was explained rationale for CES and asked to return it next session. Bedside/clinical swallow evaluation next session.  08/22/22: SLP discussed results of evaluation, sharing that pt's "hoarseness" is very likely due to reduced breath support and that after pt engaged in loud "hey!" Her vocal quality had vastly improved. SLP attributed this to pt's re-familiarization with abdominal engagement when speaking.   PATIENT EDUCATION: Education details: See above in "today's treatment" Person educated: Patient Education method: Explanation, Demonstration, and Verbal cues Education comprehension: verbalized understanding, returned demonstration, verbal cues required, and needs further education  HOME EXERCISE PROGRAM: "Hey" x10/day.   GOALS: Goals reviewed with patient? Yes  SHORT TERM GOALS: Target date: 09/29/22  Pt will undergo bedside swallow evaluation and subsequent MBS if clinically  indicated Baseline: Goal status: Met  2.  Pt will demo loud sustained /a/ or "Hey - ahhhhhh" with low 90s dB x4 sessions  Baseline:  Goal status: Ongoing  3.  Pt will correctly acknowledge using abdominal musculature/reduced vocal fry in sentence response tasks x3 sessions Baseline:  Goal status: Ongoing  4.  Pt will demo low 70s dB average in sentence responses in 2 sessions Baseline:  Goal status: Ongoing   LONG TERM GOALS: Target date: 10/31/22  Pt will report higher/better PROM scores than initial session Baseline:  Goal status: Ongoing  2.  Pt will report WNL vocal stamina in conversations >35 minutes between 3 sessions Baseline:  Goal status: Ongoing  3.  Pt will demo WNL volume (low 70s dB average) in 30 minutes conversation with modified independence (external cues PRN) x3 sessions Baseline:  Goal status: Ongoing   ASSESSMENT:  CLINICAL IMPRESSION: Patient is a 72 y.o. female who is being seen for treatment of speech (dysarthria) in light of her Parkinson's Disease. Venecia also reports incr'd frequency of sx of dysphagia and a BSE today resulted in SLP suggesting MBSS, which was sent today for scheduling. She has decr'd vocal stamina in conversations approx 30-35 minutes in length, and engages in these length conversations primarily on the phone. Secondly she reports "hoarse" voice which SLP suspects is vocal fry.  OBJECTIVE IMPAIRMENTS: Objective impairments include dysarthria and dysphagia. These impairments are limiting patient from household responsibilities, ADLs/IADLs, effectively communicating at home and in community, and safety when swallowing.Factors affecting potential to achieve goals and functional outcome are  none .Marland Kitchen Patient will benefit from skilled SLP services to address above impairments and improve overall function.  REHAB POTENTIAL: Good  PLAN:  SLP FREQUENCY: 2x/week  SLP DURATION: 8 weeks or 17 visits  PLANNED INTERVENTIONS: Aspiration  precaution training, Pharyngeal strengthening exercises, Diet toleration management , Environmental controls, Trials of upgraded texture/liquids, Cueing hierachy, Internal/external aids, SLP instruction and feedback, Compensatory strategies, and Patient/family education    [facility], [place] 09/12/2022, 12:03 AM

## 2022-09-13 ENCOUNTER — Ambulatory Visit: Payer: Medicare Other

## 2022-09-13 ENCOUNTER — Ambulatory Visit: Payer: Medicare Other | Admitting: Occupational Therapy

## 2022-09-13 DIAGNOSIS — M6281 Muscle weakness (generalized): Secondary | ICD-10-CM | POA: Diagnosis not present

## 2022-09-13 DIAGNOSIS — R278 Other lack of coordination: Secondary | ICD-10-CM | POA: Diagnosis not present

## 2022-09-13 DIAGNOSIS — R1310 Dysphagia, unspecified: Secondary | ICD-10-CM | POA: Diagnosis not present

## 2022-09-13 DIAGNOSIS — R29818 Other symptoms and signs involving the nervous system: Secondary | ICD-10-CM | POA: Diagnosis not present

## 2022-09-13 DIAGNOSIS — R1312 Dysphagia, oropharyngeal phase: Secondary | ICD-10-CM | POA: Diagnosis not present

## 2022-09-13 DIAGNOSIS — R471 Dysarthria and anarthria: Secondary | ICD-10-CM

## 2022-09-13 NOTE — Therapy (Signed)
OUTPATIENT OCCUPATIONAL THERAPY NEURO EVALUATION  Patient Name: Kristin [name] MRN: [id number] DOB:[birth year], 72 y.o., female Today's Date: 09/13/2022  PCP: Marin Olp, MD REFERRING PROVIDER: Ludwig Clarks, DO  END OF SESSION:    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   Hyperlipidemia    Hypertension    Infertility, female    Low back pain 10/17/2007   Qualifier: Diagnosis of  By: [name], [name]    Measles as a child   Obesity    Osteoarthritis    Parkinson disease    Plantar fasciitis of left foot 06/19/2012   Preventative health care 12/19/2015   Rheumatoid arthritis ([place]) 01/08/2018   Rosacea 10/05/2014   Swallowing difficulty    Urinary frequency 02/25/2013   Visual floaters 05/11/2014   Past Surgical History:  Procedure Laterality Date   ABDOMINAL HYSTERECTOMY  2006   total   esophageal     stretching   HERNIA REPAIR N/A    laporoscopy     LEFT HEART CATH AND CORONARY ANGIOGRAPHY N/A 06/02/2019   Procedure: LEFT HEART CATH AND CORONARY ANGIOGRAPHY;  Surgeon: Jettie Booze, MD;  Location: [facility];  Service: Cardiovascular;  Laterality: N/A;   TONSILLECTOMY     TOTAL HIP ARTHROPLASTY Right 2010   wisdom teeth extracted     Patient Active Problem List   Diagnosis Date Noted   Vitamin B 12 deficiency 05/30/2021   Anemia 03/16/2020   Coronary artery disease involving native  coronary artery of native heart without angina pectoris 11/91/4782   Diastolic dysfunction 95/62/1308   Rheumatoid arthritis ([place]) 01/08/2018   Hand pain, right 10/15/2017   Vitamin D deficiency 12/20/2016   Prediabetes 12/20/2016   Shortness of breath on exertion 11/27/2016   Morbid obesity ([place])    Constipation 12/07/2015   BCC (basal cell carcinoma of skin) 10/05/2014   Rosacea 10/05/2014   Parkinson's disease ([place]) 05/11/2014   Screening for cervical cancer 07/17/2012   Foot pain, bilateral 06/19/2012   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  Rationale for Evaluation and Treatment: Rehabilitation  SUBJECTIVE:   SUBJECTIVE STATEMENT: Pt reports that it took awhile to get scheduled with OT, but in the meantime she has had an inflamed tendon that she has been dealing with. Pt accompanied by: self  PERTINENT HISTORY: PD, rheumatoid arthritis, depression and anxiety  PRECAUTIONS: Fall  WEIGHT  BEARING RESTRICTIONS: No  PAIN:  Are you having pain? No  FALLS: Has patient fallen in last 6 months? No  LIVING ENVIRONMENT: Lives with: lives with their family and lives with their spouse (and adult son lives with them, helps with going to the grocery store, putting away groceries, and odd jobs around the house) Lives in: House/apartment Stairs: Yes: Internal: full flight of steps, but lives on main floor with no need to use steps;  and External: 1 steps; none Has following  equipment at home: shower chair, Grab bars, and hand held shower head, and walking stick  PLOF: Independent and Independent with basic ADLs, requires increased time for ADLs  PATIENT GOALS: to get a little more efficient with movements and every day tasks  OBJECTIVE:   HAND DOMINANCE: Left  ADLs: Overall ADLs: Mod I, however reports that it takes quite a bit longer to put on shoes, socks, and jacket Transfers/ambulation related to ADLs: utilizes walking stick with all mobility in home and in community Eating: reports that she has changed what she eats, especially when out at restaurant, with no longer eating soups or salad due to increased time to manage LB Dressing: increased time to put on shoes and socks Equipment: Shower seat with back, Grab bars, Walk in shower, and hand held shower head  IADLs: Shopping: son does the shopping Light housekeeping: laundry, empties the dishwasher Meal Prep: completes all the cooking, reports it does not go "as smoothly" as it has; easily distracted if phone rings or gets distracted by anything Community mobility: still driving Medication management: Mod I, uses weekly pill box Handwriting: 90% legible and Increased time  MOBILITY STATUS: Needs Assist: utilizing walking stick with all mobility  POSTURE COMMENTS:  rounded shoulders  ACTIVITY TOLERANCE: Activity tolerance: WFL, reports taking longer for all ADLs and IADLs  FUNCTIONAL OUTCOME MEASURES: Physical performance test: #1 (handwriting):13.28 sec*     #2 (self-feeding): 13.57 sec     #4 (jacket on/off): 16.25 sec  UPPER EXTREMITY ROM:  WFL bilaterally   UPPER EXTREMITY MMT:   WFL bilaterally   COORDINATION: 9 Hole Peg test: Right: 22.10 sec; Left: 26.90 sec Box and Blocks:  Right 56 blocks (hitting barrier x4), Left 60 blocks Tremors: Resting, LLE  SENSATION: WFL  COGNITION: Overall cognitive status: Within functional limits for tasks assessed  VISION: Subjective  report: notes slight change over time Baseline vision: Wears glasses all the time Visual history:  sees a retina specialist, dr does note beginnings of cataracts  VISION ASSESSMENT: Not tested  OBSERVATIONS: mild resting tremor LLE during evaluation   TODAY'S TREATMENT:                                                                             09/13/22 Initiate large amplitude movements, engage in dual tasking for cognitive challenge, [place] tasks for ROM and strengthening Utensils with modified grasp.  Engaged in [place].  Glasses. Discussed modifications to hand placement and/or dish/utensil size.   Large ampltidue   08/22/22 Educated on hand placement with donning jacket.  Pt able to return demonstration with good technique. Pt will benefit from additional practice to reinforce carryover.  PATIENT EDUCATION: Education details: Educated on role and  purpose of OT as well as potential interventions and goals for therapy based on initial evaluation findings. Person educated: Patient Education method: Customer service manager Education comprehension: verbalized understanding  HOME EXERCISE PROGRAM: TBD   GOALS: Goals reviewed with patient? No  SHORT TERM GOALS: Target date: 09/22/22  Pt will be Independent with PD specific HEP. Baseline: Goal status: IN PROGRESS  2.  Pt will verbalize understanding of adaptive techniques, positioning, and body mechanics to increase independence, ease, and sequencing with ADLs and IADLs. Baseline:  Goal status: IN PROGRESS    3.  Pt will demonstrate increased ease with dressing as evidenced by decreasing PPT#4(don/ doff jacket) to 12 secs or less Baseline: 16.25 sec Goal status: IN PROGRESS  4.  Pt will demonstrate improved sustained grasp on items to engage in IADLs (emptying dishwasher, setting table, etc.)  Baseline:  Goal status: IN PROGRESS   LONG TERM GOALS: Target date: 10/20/22  Pt will verbalize or demonstrate improvements  in speed and efficiency with donning socks and shoes. Baseline:  Goal status: IN PROGRESS  2.  Pt will demonstrate improved ease with feeding as evidenced by decreasing PPT#2 (self feeding) by 3 secs Baseline: 13.57 sec Goal status: IN PROGRESS  3.  Pt will demonstrate improved fine motor coordination for ADLs as evidenced by decreasing 9 hole peg test score for RUE by 3 secs Baseline: Right: 22.10 sec; Left: 26.90 sec Goal status: IN PROGRESS  4.  Pt will demonstrate improved UE functional use for ADLs as evidenced by increasing box/ blocks score by 4 blocks with RUE (hitting barrier </+ to 1 time) Baseline: Right 56 blocks (hitting barrier x4), Left 60 blocks Goal status: IN PROGRESS  5.  Pt will demonstrate ability to engage in dual task IADL activity with good alternating attention to complete simple functional task (simple snack prep, laundry task, etc) without error. Baseline:  Goal status: IN PROGRESS   ASSESSMENT:  CLINICAL IMPRESSION: Patient is a 72 y.o. female who was seen today for occupational therapy evaluation for Parkinson's disease and its impacts on safety and ease with completing ADLs/IADLs. Pt currently lives with spouse and adult son in a 2 story home, pt resides solely on main floor without need to ascend/descend steps. PMHx includes rheumatoid arthritis, depression, and axiety. Pt will benefit from skilled occupational therapy services to address strength and coordination, balance, GM/FM control, cognition, safety awareness, introduction of compensatory strategies/AE prn, and implementation of an HEP to improve participation and safety during ADLs and IADLs.   PERFORMANCE DEFICITS: in functional skills including ADLs, IADLs, coordination, dexterity, strength, Fine motor control, Gross motor control, mobility, balance, body mechanics, endurance, decreased knowledge of precautions, decreased knowledge of use of DME, and UE functional use, cognitive skills including  attention and sequencing, and psychosocial skills including environmental adaptation, habits, and routines and behaviors.   IMPAIRMENTS: are limiting patient from ADLs and IADLs.   CO-MORBIDITIES: may have co-morbidities  that affects occupational performance. Patient will benefit from skilled OT to address above impairments and improve overall function.  MODIFICATION OR ASSISTANCE TO COMPLETE EVALUATION: No modification of tasks or assist necessary to complete an evaluation.  OT OCCUPATIONAL PROFILE AND HISTORY: Problem focused assessment: Including review of records relating to presenting problem.  CLINICAL DECISION MAKING: LOW - limited treatment options, no task modification necessary  REHAB POTENTIAL: Good  EVALUATION COMPLEXITY: Low    PLAN:  OT FREQUENCY: 1-2x/week  OT DURATION: 8 weeks  PLANNED INTERVENTIONS: self care/ADL training, therapeutic exercise, therapeutic activity, neuromuscular  re-education, balance training, functional mobility training, ultrasound, compression bandaging, moist heat, cryotherapy, patient/family education, cognitive remediation/compensation, psychosocial skills training, energy conservation, coping strategies training, and DME and/or AE instructions  RECOMMENDED OTHER SERVICES: NA  CONSULTED AND AGREED WITH PLAN OF CARE: Patient  PLAN FOR NEXT SESSION: Initiate large amplitude movements, engage in dual tasking for cognitive challenge, [place] tasks for ROM and strengthening   Atziry Baranski, [place], OTR/L 09/13/2022, 2:51 PM

## 2022-09-13 NOTE — Therapy (Signed)
OUTPATIENT SPEECH LANGUAGE PATHOLOGY PARKINSON'S TREATMENT   Patient Name: Kristin [name] MRN: [id number] DOB:1951-01-18, 72 y.o., female Today's Date: 09/13/2022  PCP: Kristin Olp MD REFERRING PROVIDER: Alonza Bogus, DO  END OF SESSION:  End of Session - 09/13/22 1421     Visit Number 5    Number of Visits 17    Date for SLP Re-Evaluation 10/31/22    SLP Start Time 1406    SLP Stop Time  6967    SLP Time Calculation (min) 39 min    Activity Tolerance Patient tolerated treatment well                Past Medical History:  Diagnosis Date   Abnormal vaginal Pap smear    Acute pharyngitis 02/25/2013   Anemia    Anxiety    Arthritis    BCC (basal cell carcinoma of skin) 10/05/2014   On back   Chicken pox as a child   Chronic UTI    sees dr Kristin [name]   Constipation 12/07/2015   Depression with anxiety 11/02/2009   Qualifier: Diagnosis of  By: [name], LPN, Bonnye M    Dermatitis 07/17/2012   Esophageal stricture 1994   Fibroids    Foot pain, bilateral 06/19/2012   GERD (gastroesophageal reflux disease)    Hiatal hernia    Hyperglycemia 08/19/2013   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   Hyperlipidemia    Hypertension    Infertility, female    Low back pain 10/17/2007   Qualifier: Diagnosis of  By: [name], [name]    Measles as a child   Obesity    Osteoarthritis    Parkinson disease    Plantar fasciitis of left foot 06/19/2012   Preventative health care 12/19/2015   Rheumatoid arthritis ([place]) 01/08/2018   Rosacea 10/05/2014   Swallowing difficulty    Urinary frequency 02/25/2013   Visual floaters 05/11/2014   Past Surgical History:  Procedure Laterality Date   ABDOMINAL HYSTERECTOMY  2006   total   esophageal     stretching   HERNIA REPAIR N/A    laporoscopy     LEFT HEART CATH AND CORONARY ANGIOGRAPHY N/A 06/02/2019   Procedure: LEFT HEART CATH AND CORONARY ANGIOGRAPHY;  Surgeon: Kristin Booze, MD;  Location: [facility];   Service: Cardiovascular;  Laterality: N/A;   TONSILLECTOMY     TOTAL HIP ARTHROPLASTY Right 2010   wisdom teeth extracted     Patient Active Problem List   Diagnosis Date Noted   Vitamin B 12 deficiency 05/30/2021   Anemia 03/16/2020   Coronary artery disease involving native coronary artery of native heart without angina pectoris 89/38/1017   Diastolic dysfunction 51/10/5850   Rheumatoid arthritis ([place]) 01/08/2018   Hand pain, right 10/15/2017   Vitamin D deficiency 12/20/2016   Prediabetes 12/20/2016   Shortness of breath on exertion 11/27/2016   Morbid obesity ([place])    Constipation 12/07/2015   BCC (basal cell carcinoma of skin) 10/05/2014   Rosacea 10/05/2014   Parkinson's disease ([place]) 05/11/2014   Screening for cervical cancer 07/17/2012   Foot pain, bilateral 06/19/2012   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   Depression with anxiety 11/02/2009   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   Lumbar back pain with radiculopathy affecting lower extremity 10/17/2007   Essential hypertension 07/05/2007   Osteoarthritis 07/05/2007   Hyperlipidemia 06/26/2007   H/O: iron deficiency anemia 05/08/2007   Hiatal hernia with GERD 05/08/2007    ONSET DATE: Script dated 08-03-22  REFERRING DIAG:  Parkinson's Disease  THERAPY DIAG:  Dysarthria and anarthria  Oropharyngeal dysphagia  Rationale for Evaluation and Treatment: Rehabilitation  SUBJECTIVE:   SUBJECTIVE STATEMENT: "I practiced my talking this morning. Every time I said something I made it louder." Pt accompanied by: self  PERTINENT HISTORY: See above for PMH. Pt with RA, on methotrexate.   PAIN:  Are you having pain? No  FALLS: Has patient fallen in last 6 months?  No  LIVING ENVIRONMENT: Lives with: lives with their family and lives with their spouse Lives in: House/apartment  PLOF:  Level of assistance: Independent with ADLs Employment: Retired  PATIENT GOALS: Improve communication, and swallowing  if necessary  OBJECTIVE:   [place] (PROM): Communication Effectiveness Survey: pt scored 16/32 (higher scores indicate better QOL) with communicating across a room and over the telephone being "not at all effective".  EAT-10: 13/40 (lower scores indicate better QOL.Pt rated 3/4 (Notable problem) with swallowing solids takes extra effort, swallowing pills takes extra effort, and swallowing is stressful.  TODAY'S TREATMENT:                                                                                                                                         DATE:  09/13/22: Pt reports being better with homework since last session. Loud "hey" with low 90s dB with less pressed/tight voice than previous session and pt Id'd this (tight voice) correctly when it occurred. SLP trained pt for loud /a/ by using a strong diaphragm and easy gentle thyroid/neck, and assisted pt with her pitch for /a/ which started too low. Her everyday sentences were completed today with more consistency in the entire sentence maintaining WNL loudness. She  provided bill and coin combinations with WNL loudness and answered simple questions with occasional min A to decr frequency of loudness decay. She was provided with HEP for loud /a/ 5x, everyday sentences, and 10 hard swallows. She was also provided with handouts and the rationale ("develop muscle memory for louder speech") for practicing them as well.   09/11/22: Pt has not practiced to recommended frequency. [name] said she will do better with this for next session. Loud "hey" with low 90s dB with some pressed/tight/squeezed voice - pt correctly Id'd this with min A from SLP but then Id'd independently after this. SLP used "hey" and "ah" together to attempt to foster relaxed /a/ but pt unable to completely perform in order to perform at home at this time. Everyday sentences produced mid 80s dB with initial cues necessary for breath and when to initiate  voicing. [name] had 80% success with WNL volume when reading a phrase and then providing an answer. Homework was provided for this until next session. SLP stressed BID completion of HEP and of homework.  09/06/22: Bedside/clinical swallow eval. See initial eval for oral-motor assessment. Pt reports she does not really eat meat as much due to a  little anxiety about it choking her. She endorsed taking smaller bites and sips in the last 2-3 months, compared to prior to this. Pt told SLP she is taking more care and caution due to fear of choking, with meat, and has almost ceased eating it. SLP had pt add 10 effortful swallows BID to begin to work on swallow strength. If not necessary after MBS, will d/c this exercise. CLINICAL SWALLOW ASSESSMENT:   Current diet: regular, thin liquids, and current diet modifications: pt does not eat entrees of meat anymore due to fear of choking. Will have meat in a dish, but not entrees. Dentition: adequate natural dentition Patient directly observed with POs: Yes: regular, dysphagia 3 (soft), dysphagia 1 (puree), and thin liquids  Feeding: able to feed self Liquids provided by: cup Oral phase signs and symptoms: prolonged mastication and prolonged bolus formation Pharyngeal phase signs and symptoms: none noted  09/01/22: Pt arrived with WNL volume in simple to mod=complex conversation - for approx 7 minutes and then conversation reverted to sub-WNL until SLP told pt and then she incr'd volume to WNL for another 5 minutes. SLP assisted pt with proper vocal production with "hey" stressing abdominal recruitment. Pt was approx 95-90% successful and was able to ID strain from vocal folds instead of abdominal use. 10 loud "hey" BID was prescribed. Pt also to arrive with 10 everyday sentences next session. SLP highlighted pt's habitual throat clearing and encouraged a small sip of water with a strong swallow. Pt self-cued after a throat clear multiple (x3-4) during the remainder of  the session.   Pt was explained rationale for CES and asked to return it next session. Bedside/clinical swallow evaluation next session.  08/22/22: SLP discussed results of evaluation, sharing that pt's "hoarseness" is very likely due to reduced breath support and that after pt engaged in loud "hey!" Her vocal quality had vastly improved. SLP attributed this to pt's re-familiarization with abdominal engagement when speaking.   PATIENT EDUCATION: Education details: See above in "today's treatment" Person educated: Patient Education method: Explanation, Demonstration, and Verbal cues Education comprehension: verbalized understanding, returned demonstration, verbal cues required, and needs further education  HOME EXERCISE PROGRAM: "Haaaaaaaaaah" x5, everyday sentences, and 10 effortful swallows   GOALS: Goals reviewed with patient? Yes  SHORT TERM GOALS: Target date: 09/29/22  Pt will undergo bedside swallow evaluation and subsequent MBS if clinically indicated Baseline: Goal status: Met  2.  Pt will demo loud sustained /a/ or "Hey - ahhhhhh" with low 90s dB x4 sessions  Baseline: 09-11-22, 09-13-22 Goal status: Ongoing  3.  Pt will correctly acknowledge using abdominal musculature/reduced vocal fry in sentence response tasks x3 sessions Baseline: 09/13/22 Goal status: Ongoing  4.  Pt will demo low 70s dB average in sentence responses in 2 sessions Baseline:  Goal status: Ongoing   LONG TERM GOALS: Target date: 10/31/22  Pt will report higher/better PROM scores than initial session Baseline:  Goal status: Ongoing  2.  Pt will report WNL vocal stamina in conversations >35 minutes between 3 sessions Baseline:  Goal status: Ongoing  3.  Pt will demo WNL volume (low 70s dB average) in 30 minutes conversation with modified independence (external cues PRN) x3 sessions Baseline:  Goal status: Ongoing   ASSESSMENT:  CLINICAL IMPRESSION: Patient is a 72 y.o. female who is being  seen for treatment of speech (dysarthria) in light of her Parkinson's Disease. SLP suggesting MBSS, which pt will call to schedule (was called but went to VM). She has  decr'd vocal stamina in conversations approx 30-35 minutes in length, and engages in these length conversations primarily on the phone. Secondly she reports "hoarse" voice which SLP suspects is vocal fry. Pt has begun to note her husband is not asking her to repeat at the same frequency as prior to [place]: Objective impairments include dysarthria and dysphagia. These impairments are limiting patient from household responsibilities, ADLs/IADLs, effectively communicating at home and in community, and safety when swallowing.Factors affecting potential to achieve goals and functional outcome are  none .Marland Kitchen Patient will benefit from skilled SLP services to address above impairments and improve overall function.  REHAB POTENTIAL: Good  PLAN:  SLP FREQUENCY: 2x/week  SLP DURATION: 8 weeks or 17 visits  PLANNED INTERVENTIONS: Aspiration precaution training, Pharyngeal strengthening exercises, Diet toleration management , Environmental controls, Trials of upgraded texture/liquids, Cueing hierachy, Internal/external aids, SLP instruction and feedback, Compensatory strategies, and Patient/family education    [facility], [place] 09/13/2022, 2:28 PM

## 2022-09-13 NOTE — Patient Instructions (Signed)
PWR! Hands  With arms stretched out in front of you (elbows straight), perform the following: PWR! Up: Close hands and flick fingers open and apart BIG PWR! Rock: Move wrists up and down [employer]! Twist: Twist palms up and down BIG PWR! Step: Touch index finger to thumb while keeping other fingers straight. Flick fingers out BIG (thumb out/straighten fingers). Repeat with other fingers. (Step your thumb to each finger).  Then, start with elbows bent and hands closed. PWR! Up: Close hands and flick fingers open and apart BIG PWR! Rock: Move wrists up and down [employer]! Twist: Twist palms up and down BIG PWR! Step: Touch index finger to thumb while keeping other fingers straight. Flick fingers out BIG (thumb out/straighten fingers). Repeat with other fingers. (Step your thumb to each finger).   PWR! Hands: Push hands out BIG. Elbows straight, wrists up, fingers open and spread apart BIG. (Can also perform by pushing down on table, chair, knees. Push above head, out to the side, behind you, in front of you.)   ** Make each movement big and deliberate so that you feel the movement.  Perform at least 10 repetitions 1x/day, but perform PWR! hands throughout the day when you are having trouble using your hands (picking up/manipulating small objects, writing, eating, typing, sewing, buttoning, etc.).

## 2022-09-14 ENCOUNTER — Telehealth (HOSPITAL_COMMUNITY): Payer: Self-pay

## 2022-09-14 ENCOUNTER — Other Ambulatory Visit (HOSPITAL_COMMUNITY): Payer: Self-pay

## 2022-09-14 DIAGNOSIS — R1310 Dysphagia, unspecified: Secondary | ICD-10-CM

## 2022-09-14 DIAGNOSIS — M25571 Pain in right ankle and joints of right foot: Secondary | ICD-10-CM | POA: Diagnosis not present

## 2022-09-14 DIAGNOSIS — R059 Cough, unspecified: Secondary | ICD-10-CM

## 2022-09-14 NOTE — Telephone Encounter (Signed)
2nd attempt to contact patient to schedule Modified Barium Swallow - left voicemail. 

## 2022-09-16 ENCOUNTER — Other Ambulatory Visit: Payer: Self-pay | Admitting: Family Medicine

## 2022-09-18 ENCOUNTER — Ambulatory Visit: Payer: Medicare Other | Admitting: Occupational Therapy

## 2022-09-20 DIAGNOSIS — M7671 Peroneal tendinitis, right leg: Secondary | ICD-10-CM | POA: Diagnosis not present

## 2022-09-26 ENCOUNTER — Ambulatory Visit: Payer: Medicare Other

## 2022-09-26 ENCOUNTER — Ambulatory Visit: Payer: Medicare Other | Admitting: Occupational Therapy

## 2022-09-26 DIAGNOSIS — M6281 Muscle weakness (generalized): Secondary | ICD-10-CM

## 2022-09-26 DIAGNOSIS — R1310 Dysphagia, unspecified: Secondary | ICD-10-CM | POA: Diagnosis not present

## 2022-09-26 DIAGNOSIS — R29818 Other symptoms and signs involving the nervous system: Secondary | ICD-10-CM | POA: Diagnosis not present

## 2022-09-26 DIAGNOSIS — R471 Dysarthria and anarthria: Secondary | ICD-10-CM | POA: Diagnosis not present

## 2022-09-26 DIAGNOSIS — R1312 Dysphagia, oropharyngeal phase: Secondary | ICD-10-CM

## 2022-09-26 DIAGNOSIS — R278 Other lack of coordination: Secondary | ICD-10-CM

## 2022-09-26 NOTE — Therapy (Signed)
OUTPATIENT OCCUPATIONAL THERAPY  Treatment Note  Patient Name: BREALYNN CONTINO MRN: [id number] DOB:1951/03/03, 72 y.o., female Today's Date: 09/13/2022  PCP: Marin Olp, MD REFERRING PROVIDER: Ludwig Clarks, DO  END OF SESSION:  OT End of Session - 09/26/22 0932     Visit Number 3    Number of Visits 13    Date for OT Re-Evaluation 10/20/22    Authorization Type Medicare A&B    OT Start Time 0930    OT Stop Time 9924    OT Time Calculation (min) 45 min               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   Hyperlipidemia    Hypertension    Infertility, female    Low back pain 10/17/2007   Qualifier: Diagnosis of  By: [name], [name]    Measles as a child   Obesity    Osteoarthritis    Parkinson disease    Plantar fasciitis of left foot 06/19/2012   Preventative health care 12/19/2015   Rheumatoid arthritis ([place]) 01/08/2018   Rosacea 10/05/2014   Swallowing difficulty    Urinary frequency 02/25/2013   Visual floaters 05/11/2014   Past Surgical History:  Procedure Laterality Date   ABDOMINAL HYSTERECTOMY  2006   total   esophageal     stretching   HERNIA REPAIR N/A    laporoscopy     LEFT HEART CATH AND CORONARY ANGIOGRAPHY N/A 06/02/2019   Procedure: LEFT HEART CATH AND CORONARY ANGIOGRAPHY;  Surgeon: Jettie Booze, MD;  Location: [facility];  Service: Cardiovascular;   Laterality: N/A;   TONSILLECTOMY     TOTAL HIP ARTHROPLASTY Right 2010   wisdom teeth extracted     Patient Active Problem List   Diagnosis Date Noted   Vitamin B 12 deficiency 05/30/2021   Anemia 03/16/2020   Coronary artery disease involving native coronary artery of native heart without angina pectoris 26/83/4196   Diastolic dysfunction 22/29/7989   Rheumatoid arthritis ([place]) 01/08/2018   Hand pain, right 10/15/2017   Vitamin D deficiency 12/20/2016   Prediabetes 12/20/2016   Shortness of breath on exertion 11/27/2016   Morbid obesity ([place])    Constipation 12/07/2015   BCC (basal cell carcinoma of skin) 10/05/2014   Rosacea 10/05/2014   Parkinson's disease ([place]) 05/11/2014   Screening for cervical cancer 07/17/2012   Foot pain, bilateral 06/19/2012   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   Depression with anxiety 11/02/2009   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   Lumbar back pain with radiculopathy affecting lower extremity 10/17/2007   Essential hypertension 07/05/2007   Osteoarthritis 07/05/2007   Hyperlipidemia 06/26/2007   H/O: iron deficiency anemia 05/08/2007   Hiatal hernia with GERD 05/08/2007    ONSET DATE: referral date 08/03/22  REFERRING DIAG: G20.B2 (ICD-10-CM) -  Parkinson's disease with dyskinesia and fluctuating manifestations  THERAPY DIAG:  Other symptoms and signs involving the nervous system  Muscle weakness (generalized)  Other lack of coordination  Rationale for Evaluation and Treatment: Rehabilitation  SUBJECTIVE:   SUBJECTIVE STATEMENT: Pt reports that they took care of their 31 yo grandchild over the weekend.   Pt accompanied by: self  PERTINENT HISTORY: PD, rheumatoid arthritis, depression and anxiety  PRECAUTIONS: Fall  WEIGHT BEARING RESTRICTIONS: No  PAIN:  Are you having pain? No  FALLS: Has patient fallen in last 6 months? No  LIVING ENVIRONMENT: Lives with: lives with their family and lives with their spouse (and  adult son lives with them, helps with going to the grocery store, putting away groceries, and odd jobs around the house) Lives in: House/apartment Stairs: Yes: Internal: full flight of steps, but lives on main floor with no need to use steps;  and External: 1 steps; none Has following equipment at home: shower chair, Grab bars, and hand held shower head, and walking stick  PLOF: Independent and Independent with basic ADLs, requires increased time for ADLs  PATIENT GOALS: to get a little more efficient with movements and every day tasks  OBJECTIVE:   HAND DOMINANCE: Left  ADLs: Overall ADLs: Mod I, however reports that it takes quite a bit longer to put on shoes, socks, and jacket Transfers/ambulation related to ADLs: utilizes walking stick with all mobility in home and in community Eating: reports that she has changed what she eats, especially when out at restaurant, with no longer eating soups or salad due to increased time to manage LB Dressing: increased time to put on shoes and socks Equipment: Shower seat with back, Grab bars, Walk in shower, and hand held shower head  IADLs: Shopping: son does the shopping Light housekeeping: laundry, empties the dishwasher Meal Prep: completes all the cooking, reports it does not go "as smoothly" as it has; easily distracted if phone rings or gets distracted by anything Community mobility: still driving Medication management: Mod I, uses weekly pill box Handwriting: 90% legible and Increased time  MOBILITY STATUS: Needs Assist: utilizing walking stick with all mobility  POSTURE COMMENTS:  rounded shoulders  ACTIVITY TOLERANCE: Activity tolerance: WFL, reports taking longer for all ADLs and IADLs  FUNCTIONAL OUTCOME MEASURES: Physical performance test: #1 (handwriting):13.28 sec*     #2 (self-feeding): 13.57 sec     #4 (jacket on/off): 16.25 sec  UPPER EXTREMITY ROM:  WFL bilaterally   UPPER EXTREMITY MMT:   WFL  bilaterally   COORDINATION: 9 Hole Peg test: Right: 22.10 sec; Left: 26.90 sec Box and Blocks:  Right 56 blocks (hitting barrier x4), Left 60 blocks Tremors: Resting, LLE  SENSATION: WFL  COGNITION: Overall cognitive status: Within functional limits for tasks assessed  VISION: Subjective report: notes slight change over time Baseline vision: Wears glasses all the time Visual history:  sees a retina specialist, dr does note beginnings of cataracts  VISION ASSESSMENT: Not tested  OBSERVATIONS: mild resting tremor LLE during evaluation   TODAY'S TREATMENT:                                                                             09/26/22 Self-feeding:  pt noticed that she had increased difficulty with eating [place] when she went out with friends.  She reports that she just took the food home and ate it with a spoon.  OT reiterated modifications to grasp and utensil use as able to decrease effects of tremor on self-feeding. Functional reaching: engaged in reaching into overhead cabinet with RUE/LUE to obtain cups and then place on table top.  OT providing initial cues/demonstration for full grasp and purposeful, large amplitude movements when reaching.  Pt able to recognize when not getting full grasp on cups.   Med management: pt reports dropping pills occasionally.  OT educated on completing med management over top of a colored towel or wash cloth to add layer to deaden the bounce of the pills and provide visual contrast.  Pt completed opening and closing of variety of medication bottles as pt reports difficulty with typical "child proof" type.  Discussed various medication bottles options and encouraged pt to ask her pharmacy if they provide other pill bottle styles to allow for increased ease with opening pill bottles.  Pt dropping pills x1 when dispensing in to hand, however did not fall to floor due to wash cloth on surface.  Coordination: w/ each UE as appropriate, including:  picking up various small objects/coins, picking up a small object and translating palm-to-fingertips, and translating palm to fingertips to place in a container.  OT providing cues to pick up items with purposeful grasp and not drag items towards edge of table.   09/13/22 Self-care: OT providing education on hand placement on utensils as well as cups/glasses to decrease effects of tremors during self-feeding tasks.  Pt completed scooping of beans from various bowl depths and with various hand placements to carry over education with closer grip to spoon bowl and shallow height on bowls to increase motor control.  Educated on modifying pressure on utensils and cups to increase grasp and decrease tremors.   Large amplitude: OT educating on large amplitude movements with finger flicks, wrist flexion/extension, supination/pronation, and forward arm flicks with elbow extension.  OT providing verbal cues and demonstration for increased functional carryover.  Pt completed each movement with elbows flexed and elbows extension for 10 while OT providing functional carryover of each position and task.  Provided with handout (see pt instructions).     08/22/22 Educated on hand placement with donning jacket.  Pt able to return demonstration with good technique. Pt will benefit from additional practice to reinforce carryover.  PATIENT EDUCATION: Education details: ongoing condition specific education, see above  Person educated: Patient Education method: Consulting civil engineer, Demonstration, Verbal cues, and Handouts Education comprehension: verbalized understanding and returned demonstration  HOME EXERCISE PROGRAM: Large amplitude UE HEP (see pt instructions)   GOALS: Goals reviewed with patient? Yes  SHORT TERM GOALS: Target date: 09/22/22  Pt will be Independent with PD specific HEP. Baseline: Goal status: IN PROGRESS  2.  Pt will verbalize understanding of adaptive techniques, positioning, and body mechanics  to increase independence, ease, and sequencing with ADLs and IADLs. Baseline:  Goal status: MET - 09/26/22    3.  Pt will demonstrate increased ease with dressing as evidenced by decreasing PPT#4(don/ doff jacket) to 12 secs or less Baseline: 16.25 sec Goal status: IN PROGRESS  4.  Pt will demonstrate improved sustained grasp on items to engage in IADLs (emptying dishwasher, setting table, etc.)  Baseline:  Goal status: IN PROGRESS   LONG TERM GOALS: Target date: 10/20/22  Pt will verbalize or demonstrate improvements  in speed and efficiency with donning socks and shoes. Baseline:  Goal status: IN PROGRESS  2.  Pt will demonstrate improved ease with feeding as evidenced by decreasing PPT#2 (self feeding) by 3 secs Baseline: 13.57 sec Goal status: IN PROGRESS  3.  Pt will demonstrate improved fine motor coordination for ADLs as evidenced by decreasing 9 hole peg test score for RUE by 3 secs Baseline: Right: 22.10 sec; Left: 26.90 sec Goal status: IN PROGRESS  4.  Pt will demonstrate improved UE functional use for ADLs as evidenced by increasing box/ blocks score by 4 blocks with RUE (hitting barrier </+ to 1 time) Baseline: Right 56 blocks (hitting barrier x4), Left 60 blocks Goal status: IN PROGRESS  5.  Pt will demonstrate ability to engage in dual task IADL activity with good alternating attention to complete simple functional task (simple snack prep, laundry task, etc) without error. Baseline:  Goal status: IN PROGRESS   ASSESSMENT:  CLINICAL IMPRESSION: Pt very appreciative of education on modifications of setup and pill bottles to decrease spilling and dropping of medications.  Receptive to education on modifying grip along with large amplitude movements during functional tasks to maintain ROM and decrease impact of tremors on movements.  Pt demonstrating difficulty with alternating attention, frequently having to stop talking, especially when completing a task requiring  increased focus.  PERFORMANCE DEFICITS: in functional skills including ADLs, IADLs, coordination, dexterity, strength, Fine motor control, Gross motor control, mobility, balance, body mechanics, endurance, decreased knowledge of precautions, decreased knowledge of use of DME, and UE functional use, cognitive skills including attention and sequencing, and psychosocial skills including environmental adaptation, habits, and routines and behaviors.   IMPAIRMENTS: are limiting patient from ADLs and IADLs.   CO-MORBIDITIES: may have co-morbidities  that affects occupational performance. Patient will benefit from skilled OT to address above impairments and improve overall function.  MODIFICATION OR ASSISTANCE TO COMPLETE EVALUATION: No modification of tasks or assist necessary to complete an evaluation.  OT OCCUPATIONAL PROFILE AND HISTORY: Problem focused assessment: Including review of records relating to presenting problem.  CLINICAL DECISION MAKING: LOW - limited treatment options, no task modification necessary  REHAB POTENTIAL: Good  EVALUATION COMPLEXITY: Low    PLAN:  OT FREQUENCY: 1-2x/week  OT DURATION: 8 weeks  PLANNED INTERVENTIONS: self care/ADL training, therapeutic exercise, therapeutic activity, neuromuscular re-education, balance training, functional mobility training, ultrasound, compression bandaging, moist heat, cryotherapy, patient/family education, cognitive remediation/compensation, psychosocial skills training, energy conservation, coping strategies training, and DME and/or AE instructions  RECOMMENDED OTHER SERVICES: NA  CONSULTED AND AGREED WITH PLAN OF CARE: Patient  PLAN FOR NEXT SESSION: Review large amplitude movements - complete reaching in standing and overhead/incorporate PNF reaching, engage in dual tasking for cognitive challenge, [place] tasks for ROM and strengthening.  Pill box assessment   Doug Bucklin, OTR/L 09/26/2022, 9:32 AM

## 2022-09-26 NOTE — Therapy (Signed)
OUTPATIENT SPEECH LANGUAGE PATHOLOGY PARKINSON'S TREATMENT   Patient Name: Kristin [name] MRN: [id number] DOB:Apr 05, 1951, 72 y.o., female Today's Date: 09/26/2022  PCP: Marin Olp MD REFERRING PROVIDER: Alonza Bogus, DO  END OF SESSION:  End of Session - 09/26/22 1029     Visit Number 6    Number of Visits 17    Date for SLP Re-Evaluation 10/31/22    SLP Start Time 1021    SLP Stop Time  1101    SLP Time Calculation (min) 40 min    Activity Tolerance Patient tolerated treatment well                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   Hyperlipidemia    Hypertension    Infertility, female    Low back pain 10/17/2007   Qualifier: Diagnosis of  By: [name], [name]    Measles as a child   Obesity    Osteoarthritis    Parkinson disease    Plantar fasciitis of left foot 06/19/2012   Preventative health care 12/19/2015   Rheumatoid arthritis ([place]) 01/08/2018   Rosacea 10/05/2014   Swallowing difficulty    Urinary frequency 02/25/2013   Visual floaters 05/11/2014   Past Surgical History:  Procedure Laterality Date   ABDOMINAL HYSTERECTOMY  2006   total   esophageal     stretching   HERNIA REPAIR N/A    laporoscopy     LEFT HEART CATH AND CORONARY ANGIOGRAPHY N/A 06/02/2019   Procedure: LEFT HEART CATH AND CORONARY ANGIOGRAPHY;  Surgeon: Jettie Booze, MD;  Location: [facility];   Service: Cardiovascular;  Laterality: N/A;   TONSILLECTOMY     TOTAL HIP ARTHROPLASTY Right 2010   wisdom teeth extracted     Patient Active Problem List   Diagnosis Date Noted   Vitamin B 12 deficiency 05/30/2021   Anemia 03/16/2020   Coronary artery disease involving native coronary artery of native heart without angina pectoris 69/62/9528   Diastolic dysfunction 41/32/4401   Rheumatoid arthritis ([place]) 01/08/2018   Hand pain, right 10/15/2017   Vitamin D deficiency 12/20/2016   Prediabetes 12/20/2016   Shortness of breath on exertion 11/27/2016   Morbid obesity ([place])    Constipation 12/07/2015   BCC (basal cell carcinoma of skin) 10/05/2014   Rosacea 10/05/2014   Parkinson's disease ([place]) 05/11/2014   Screening for cervical cancer 07/17/2012   Foot pain, bilateral 06/19/2012   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   Depression with anxiety 11/02/2009   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   Lumbar back pain with radiculopathy affecting lower extremity 10/17/2007   Essential hypertension 07/05/2007   Osteoarthritis 07/05/2007   Hyperlipidemia 06/26/2007   H/O: iron deficiency anemia 05/08/2007   Hiatal hernia with GERD 05/08/2007    ONSET DATE: Script dated 08-03-22  REFERRING DIAG:  Parkinson's Disease  THERAPY DIAG:  Dysarthria and anarthria  Oropharyngeal dysphagia  Rationale for Evaluation and Treatment: Rehabilitation  SUBJECTIVE:   SUBJECTIVE STATEMENT: "I practiced, talking with my sisters on Zoom." Pt accompanied by: self  PERTINENT HISTORY: See above for PMH. Pt with RA, on methotrexate.   PAIN:  Are you having pain? No  FALLS: Has patient fallen in last 6 months?  No  LIVING ENVIRONMENT: Lives with: lives with their family and lives with their spouse Lives in: House/apartment  PLOF:  Level of assistance: Independent with ADLs Employment: Retired  PATIENT GOALS: Improve communication, and swallowing if necessary  OBJECTIVE:   [place] (PROM): Communication Effectiveness Survey: pt scored 16/32 (higher scores indicate better QOL) with communicating across a room and over the telephone being "not at all effective".  EAT-10: 13/40 (lower scores indicate better QOL.Pt rated 3/4 (Notable problem) with swallowing solids takes extra effort, swallowing pills takes extra effort, and swallowing is stressful.  TODAY'S TREATMENT:                                                                                                                                         DATE:  09/26/22: MBS tomorrow. Pt reports thinking about or using louder, intentional speech approx 75% of the time outside of ST. She has noticed people asking her less frequently for repeats. Pt produced loud /a/ with self corrected pressed/strained voice (stopped and corrected her production) with average in low 90s dB. Everyday sentences produced with low-mid 80s dB. Sentence responses with avergae low 70s dB and self-correcting AB, and correct ID of vocal fry. She and SLP engaged in conversational segments of approx 10 mintues with her maintaining loudness average of upper 60s-low 70s dB with rare min A for AB and maintaining loudness. Educated pt about benefit of external cues; Pt thinking of switching wrists for her watch.  09/13/22: Pt reports being better with homework since last session. Loud "hey" with low 90s dB with less pressed/tight voice than previous session and pt Id'd this (tight voice) correctly when it occurred. SLP trained pt for loud /a/ by using a strong diaphragm and easy gentle thyroid/neck, and assisted pt with her pitch for /a/ which started too low. Her everyday sentences were completed today with more consistency in the entire sentence maintaining WNL loudness. She  provided bill and coin combinations with WNL loudness and answered simple questions with occasional min A to decr frequency of loudness decay. She was provided with HEP for  loud /a/ 5x, everyday sentences, and 10 hard swallows. She was also provided with handouts and the rationale ("develop muscle memory for louder speech") for practicing them as well.   09/11/22: Pt has not practiced to recommended frequency. Dondra said she will do better with this for next session. Loud "hey" with low 90s dB with some pressed/tight/squeezed voice - pt correctly Id'd this  with min A from SLP but then Id'd independently after this. SLP used "hey" and "ah" together to attempt to foster relaxed /a/ but pt unable to completely perform in order to perform at home at this time. Everyday sentences produced mid 80s dB with initial cues necessary for breath and when to initiate voicing. Jessicia had 80% success with WNL volume when reading a phrase and then providing an answer. Homework was provided for this until next session. SLP stressed BID completion of HEP and of homework.  09/06/22: Bedside/clinical swallow eval. See initial eval for oral-motor assessment. Pt reports she does not really eat meat as much due to a little anxiety about it choking her. She endorsed taking smaller bites and sips in the last 2-3 months, compared to prior to this. Pt told SLP she is taking more care and caution due to fear of choking, with meat, and has almost ceased eating it. SLP had pt add 10 effortful swallows BID to begin to work on swallow strength. If not necessary after MBS, will d/c this exercise. CLINICAL SWALLOW ASSESSMENT:   Current diet: regular, thin liquids, and current diet modifications: pt does not eat entrees of meat anymore due to fear of choking. Will have meat in a dish, but not entrees. Dentition: adequate natural dentition Patient directly observed with POs: Yes: regular, dysphagia 3 (soft), dysphagia 1 (puree), and thin liquids  Feeding: able to feed self Liquids provided by: cup Oral phase signs and symptoms: prolonged mastication and prolonged bolus formation Pharyngeal phase signs and symptoms:  none noted  09/01/22: Pt arrived with WNL volume in simple to mod=complex conversation - for approx 7 minutes and then conversation reverted to sub-WNL until SLP told pt and then she incr'd volume to WNL for another 5 minutes. SLP assisted pt with proper vocal production with "hey" stressing abdominal recruitment. Pt was approx 95-90% successful and was able to ID strain from vocal folds instead of abdominal use. 10 loud "hey" BID was prescribed. Pt also to arrive with 10 everyday sentences next session. SLP highlighted pt's habitual throat clearing and encouraged a small sip of water with a strong swallow. Pt self-cued after a throat clear multiple (x3-4) during the remainder of the session.   Pt was explained rationale for CES and asked to return it next session. Bedside/clinical swallow evaluation next session.  08/22/22: SLP discussed results of evaluation, sharing that pt's "hoarseness" is very likely due to reduced breath support and that after pt engaged in loud "hey!" Her vocal quality had vastly improved. SLP attributed this to pt's re-familiarization with abdominal engagement when speaking.   PATIENT EDUCATION: Education details: See above in "today's treatment" Person educated: Patient Education method: Explanation, Demonstration, and Verbal cues Education comprehension: verbalized understanding, returned demonstration, verbal cues required, and needs further education  HOME EXERCISE PROGRAM: "Haaaaaaaaaah" x5, everyday sentences, and 10 effortful swallows   GOALS: Goals reviewed with patient? Yes  SHORT TERM GOALS: Target date: 09/29/22  Pt will undergo bedside swallow evaluation and subsequent MBS if clinically indicated Baseline: Goal status: Met  2.  Pt will demo loud sustained /a/ or "Hey - ahhhhhh" with low 90s dB x4 sessions  Baseline: 09-11-22, 09-13-22, 09/26/22 Goal status: Ongoing  3.  Pt will correctly acknowledge using abdominal musculature/reduced vocal fry in  sentence response tasks x3 sessions Baseline: 09/13/22, 09-26-22 Goal status: Ongoing  4.  Pt will demo low 70s dB average in sentence responses in 2 sessions Baseline: 09/26/22 Goal status: Ongoing   LONG  TERM GOALS: Target date: 10/31/22  Pt will report higher/better PROM scores than initial session Baseline:  Goal status: Ongoing  2.  Pt will report WNL vocal stamina in conversations >35 minutes between 3 sessions Baseline:  Goal status: Ongoing  3.  Pt will demo WNL volume (low 70s dB average) in 30 minutes conversation with modified independence (external cues PRN) x3 sessions Baseline:  Goal status: Ongoing   ASSESSMENT:  CLINICAL IMPRESSION: Patient is a 72 y.o. female who is being seen for treatment of speech (dysarthria) in light of her Parkinson's Disease. SLP suggesting MBSS, which pt will call to schedule (was called but went to VM). She has decr'd vocal stamina in conversations approx 30-35 minutes in length, and engages in these length conversations primarily on the phone. Secondly she reports "hoarse" voice which SLP suspects is vocal fry. Pt has begun to note her husband is not asking her to repeat at the same frequency as prior to [place]: Objective impairments include dysarthria and dysphagia. These impairments are limiting patient from household responsibilities, ADLs/IADLs, effectively communicating at home and in community, and safety when swallowing.Factors affecting potential to achieve goals and functional outcome are  none .Marland Kitchen Patient will benefit from skilled SLP services to address above impairments and improve overall function.  REHAB POTENTIAL: Good  PLAN:  SLP FREQUENCY: 2x/week  SLP DURATION: 8 weeks or 17 visits  PLANNED INTERVENTIONS: Aspiration precaution training, Pharyngeal strengthening exercises, Diet toleration management , Environmental controls, Trials of upgraded texture/liquids, Cueing hierachy, Internal/external aids,  SLP instruction and feedback, Compensatory strategies, and Patient/family education    [facility], [place] 09/26/2022, 3:29 PM

## 2022-09-27 ENCOUNTER — Ambulatory Visit (INDEPENDENT_AMBULATORY_CARE_PROVIDER_SITE_OTHER): Payer: Medicare Other | Admitting: Family Medicine

## 2022-09-27 ENCOUNTER — Encounter: Payer: Self-pay | Admitting: Family Medicine

## 2022-09-27 ENCOUNTER — Ambulatory Visit (HOSPITAL_COMMUNITY)
Admission: RE | Admit: 2022-09-27 | Discharge: 2022-09-27 | Disposition: A | Discharge status: Home / Self Care | Payer: Medicare Other | Source: Ambulatory Visit | Attending: Family Medicine | Admitting: Family Medicine

## 2022-09-27 ENCOUNTER — Other Ambulatory Visit: Payer: Self-pay | Admitting: Family Medicine

## 2022-09-27 VITALS — BP 112/74 | HR 90 | Temp 97.7°F | Ht 63.0 in | Wt 236.8 lb

## 2022-09-27 DIAGNOSIS — R1314 Dysphagia, pharyngoesophageal phase: Secondary | ICD-10-CM | POA: Diagnosis not present

## 2022-09-27 DIAGNOSIS — R059 Cough, unspecified: Secondary | ICD-10-CM | POA: Insufficient documentation

## 2022-09-27 DIAGNOSIS — G20A1 Parkinson's disease without dyskinesia, without mention of fluctuations: Secondary | ICD-10-CM | POA: Insufficient documentation

## 2022-09-27 DIAGNOSIS — F418 Other specified anxiety disorders: Secondary | ICD-10-CM | POA: Diagnosis not present

## 2022-09-27 DIAGNOSIS — I10 Essential (primary) hypertension: Secondary | ICD-10-CM

## 2022-09-27 DIAGNOSIS — R1310 Dysphagia, unspecified: Secondary | ICD-10-CM | POA: Insufficient documentation

## 2022-09-27 DIAGNOSIS — R7303 Prediabetes: Secondary | ICD-10-CM | POA: Diagnosis not present

## 2022-09-27 DIAGNOSIS — R1312 Dysphagia, oropharyngeal phase: Secondary | ICD-10-CM

## 2022-09-27 DIAGNOSIS — M069 Rheumatoid arthritis, unspecified: Secondary | ICD-10-CM

## 2022-09-27 DIAGNOSIS — E559 Vitamin D deficiency, unspecified: Secondary | ICD-10-CM

## 2022-09-27 DIAGNOSIS — E782 Mixed hyperlipidemia: Secondary | ICD-10-CM

## 2022-09-27 DIAGNOSIS — R471 Dysarthria and anarthria: Secondary | ICD-10-CM

## 2022-09-27 LAB — COMPREHENSIVE METABOLIC PANEL
ALT: 9 U/L (ref 0–35)
AST: 20 U/L (ref 0–37)
Albumin: 4.3 g/dL (ref 3.5–5.2)
Alkaline Phosphatase: 90 U/L (ref 39–117)
BUN: 17 mg/dL (ref 6–23)
CO2: 27 mEq/L (ref 19–32)
Calcium: 9.3 mg/dL (ref 8.4–10.5)
Chloride: 105 mEq/L (ref 96–112)
Creatinine, Ser: 0.64 mg/dL (ref 0.40–1.20)
GFR: 88.31 mL/min (ref >60.00)
Glucose, Bld: 114 mg/dL — ABNORMAL HIGH (ref 70–99)
Potassium: 4.3 mEq/L (ref 3.5–5.1)
Sodium: 140 mEq/L (ref 135–145)
Total Bilirubin: 0.6 mg/dL (ref 0.2–1.2)
Total Protein: 6.7 g/dL (ref 6.0–8.3)

## 2022-09-27 LAB — CBC WITH DIFFERENTIAL/PLATELET
Basophils Absolute: 0 10*3/uL (ref 0.0–0.1)
Basophils Relative: 0.6 % (ref 0.0–3.0)
Eosinophils Absolute: 0.1 10*3/uL (ref 0.0–0.7)
Eosinophils Relative: 1.4 % (ref 0.0–5.0)
HCT: 38.4 % (ref 36.0–46.0)
Hemoglobin: 12.8 g/dL (ref 12.0–15.0)
Lymphocytes Relative: 26.1 % (ref 12.0–46.0)
Lymphs Abs: 1.6 10*3/uL (ref 0.7–4.0)
MCHC: 33.2 g/dL (ref 30.0–36.0)
MCV: 90.5 fl (ref 78.0–100.0)
Monocytes Absolute: 0.6 10*3/uL (ref 0.1–1.0)
Monocytes Relative: 9.8 % (ref 3.0–12.0)
Neutro Abs: 3.8 10*3/uL (ref 1.4–7.7)
Neutrophils Relative %: 62.1 % (ref 43.0–77.0)
Platelets: 198 10*3/uL (ref 150.0–400.0)
RBC: 4.25 Mil/uL (ref 3.87–5.11)
RDW: 14.4 % (ref 11.5–15.5)
WBC: 6.1 10*3/uL (ref 4.0–10.5)

## 2022-09-27 LAB — VITAMIN D 25 HYDROXY (VIT D DEFICIENCY, FRACTURES): VITD: 19.47 ng/mL — ABNORMAL LOW (ref 30.00–100.00)

## 2022-09-27 LAB — HEMOGLOBIN A1C: Hgb A1c MFr Bld: 6.1 % (ref 4.6–6.5)

## 2022-09-27 MED ORDER — VITAMIN D (ERGOCALCIFEROL) 1.25 MG (50000 UNIT) PO CAPS: 50000.0000 [IU] | ORAL_CAPSULE | ORAL | 1 refills | Status: DC

## 2022-09-27 MED ORDER — ESCITALOPRAM OXALATE 10 MG PO TABS: 10.0000 mg | ORAL_TABLET | Freq: Every day | ORAL | 3 refills | Status: DC

## 2022-09-27 NOTE — Progress Notes (Signed)
Phone [phone] In person visit   Subjective:   Kristin [name] is a 72 y.o. year old very pleasant female patient who presents for/with See problem oriented charting Chief Complaint  Patient presents with   Follow-up   Constipation    Pt does have GI doc.   Past Medical History-  Patient Active Problem List   Diagnosis Date Noted   Coronary artery disease involving native coronary artery of native heart without angina pectoris 06/12/2019    Priority: High   Rheumatoid arthritis ([place]) 01/08/2018    Priority: High   Constipation 12/07/2015    Priority: High   Parkinson's disease ([place]) 05/11/2014    Priority: High   Vitamin B 12 deficiency 05/30/2021    Priority: Medium    Vitamin D deficiency 12/20/2016    Priority: Medium    Prediabetes 12/20/2016    Priority: Medium    Shortness of breath on exertion 11/27/2016    Priority: Medium    Morbid obesity ([place])     Priority: Medium    Depression with anxiety 11/02/2009    Priority: Medium    Essential hypertension 07/05/2007    Priority: Medium    Osteoarthritis 07/05/2007    Priority: Medium    Hyperlipidemia 06/26/2007    Priority: Medium    H/O: iron deficiency anemia 05/08/2007    Priority: Medium    Hiatal hernia with GERD 05/08/2007    Priority: Medium    Anemia 03/16/2020    Priority: Low   Diastolic dysfunction 66/02/3015    Priority: Low   BCC (basal cell carcinoma of skin) 10/05/2014    Priority: Low   Rosacea 10/05/2014    Priority: Low   Screening for cervical cancer 07/17/2012    Priority: Low   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   Foot pain, bilateral 06/19/2012    Priority: 1.   Lumbar back pain with radiculopathy affecting lower extremity 10/17/2007    Priority: 1.    Medications- reviewed and updated Current Outpatient Medications  Medication Sig Dispense  Refill   ALPRAZolam (XANAX) 0.25 MG tablet TAKE 1 TABLET BY MOUTH 2 TIMES DAILY AS NEEDED FOR ANXIETY. 30 tablet 1   carbidopa-levodopa (SINEMET CR) 50-200 MG tablet TAKE 1 TABLET BY MOUTH EVERYDAY AT BEDTIME 90 tablet 0   carbidopa-levodopa (SINEMET IR) 25-100 MG tablet TAKE 2 TABLETS BY MOUTH AT 7AM, 2TABLETS AT 11AM, 1 TABLET AT 4PM 450 tablet 0   carvedilol (COREG) 3.125 MG tablet TAKE 1 TABLET BY MOUTH TWICE A DAY WITH MEALS 180 tablet 3   DULoxetine (CYMBALTA) 30 MG capsule TAKE 1 CAPSULE BY MOUTH EVERY DAY 90 capsule 1   escitalopram (LEXAPRO) 20 MG tablet TAKE 1 TABLET BY MOUTH EVERY DAY 90 tablet 1   folic acid (FOLVITE) 1 MG tablet Take 1 mg by mouth 2 (two) times daily.     linaclotide (LINZESS) 145 MCG CAPS capsule Take 1 capsule (145 mcg total) by mouth daily before breakfast. 30 capsule 11   losartan (COZAAR) 50 MG tablet TAKE 1 TABLET BY MOUTH EVERY DAY 90 tablet 3   omeprazole (PRILOSEC) 20 MG capsule TAKE 1 CAPSULE BY MOUTH 2 TIMES DAILY AS NEEDED. 180 capsule 1   pramipexole (MIRAPEX) 0.5 MG tablet Take 1 tablet (0.5 mg total) by mouth 3 (three) times daily. 270 tablet 2   rosuvastatin (CRESTOR)  10 MG tablet TAKE 1 TABLET BY MOUTH ON MONDAYS,WEDNESDAYS, AND FRIDAYS 39 tablet 3   solifenacin (VESICARE) 5 MG tablet Take 5 mg by mouth daily.     No current facility-administered medications for this visit.     Objective:  BP 112/74   Pulse 90   Temp 97.7 F (36.5 C)   Ht '5\' 3"'$  (1.6 m)   Wt 236 lb 12.8 oz (107.4 kg)   SpO2 95%   BMI 41.95 kg/m  Gen: NAD, resting comfortably CV: RRR no murmurs rubs or gallops Lungs: CTAB no crackles, wheeze, rhonchi Ext: trace edema Skin: warm, dry Neuro: walks with cane with known parkinsons    Assessment and Plan   #social update- feels somewhat burdened by # of medical encounters between doctor visits, SLP and OT   #Peroneal tendinopathy improved after December visit with [facility]  #Parkinson's disease with RLS-Follows with Dr.  Carles Collet  S: Medication: Carbidopa levodopa (Sinemet dose sustained-release and instant release). Also on mirapex 0.5 mg TID (increased ocd tendencies and shoping so trying to cut back) -exercise class today A/P: stable on current doses- continue close follow up with Dr. Carles Collet and continue current medications    #Rheumatoid arthritis S: Medication: Methotrexate injections- has stopped and pain not worsening  A/P: no worsening symptoms but advised follow up with [facility]   #hypertension S: medication: Losartan 50 mg, coreg 3.125 mg BID BP Readings from Last 3 Encounters:  09/27/22 112/74  08/01/22 (!) 144/90  04/25/22 118/68  A/P: stable- continue current medicines    #CAD - minimal plaque on cath 2020 #hyperlipidemia S: Medication:Rosuvastatin 3 days a week, coreg 3.125 mg BID - no chest pain or sob Lab Results  Component Value Date   CHOL 149 01/12/2022   HDL 64.90 01/12/2022   LDLCALC 58 01/12/2022   LDLDIRECT 161.5 10/07/2008   TRIG 134.0 01/12/2022   CHOLHDL 2 01/12/2022  A/P: may schedule follow up with Dr. Oval Linsey but asymptomatic for CAD- continue current medications . Lipids reasonably controlled- continue current medications    # Depression with anxiety- family history and mother bipolar as well S: Medication:Lexapro 20 mg, Cymbalta 30 mg, sparing alprazolam 0.25 mg    09/27/2022    8:07 AM 01/12/2022   11:56 AM 11/03/2021    1:11 PM  Depression screen PHQ 2/9  Decreased Interest 0 0 0  Down, Depressed, Hopeless 0 0 0  PHQ - 2 Score 0 0 0  Altered sleeping 0 1   Tired, decreased energy 0 1   Change in appetite 0 1   Feeling bad or failure about yourself  0 1   Trouble concentrating 0 0   Moving slowly or fidgety/restless 0 0   Suicidal thoughts 0 0   PHQ-9 Score 0 4   Difficult doing work/chores Not difficult at all Somewhat difficult   A/P: depression has done very well lately- we opted to trial lexapro 10 mg down form 20 mg and continue cymbalta 30 mg -has seen  therapist in the past- open to considering- refer to Dr. Theodis Shove  #Constipation- 4 sisters have this as well S: Medication: Linzess 145 mcg -can ooften large/explosive but smaller dose not effective 2 years of issues with this-she has a GI doctor Dr. Silverio Decamp (prior Dr. Fuller Plan) -has tried pelvic floor therapy A/P: ongoing issue- sees Dr. Silverio Decamp soon- will hold off on vesicare   # GERD S:Medication:  omeprazole 20 mg daily from BID -has had some trobele swallowing and has modified barium  swallow today that was ordered under my name through SLP - feels bloated, burping A/P: with worsening issues discussed could try BID again and discuss with Dr. Silverio Decamp   #Vitamin D deficiency S: Medication: 50,000 units weekly started June 2023 and July visit was actually on 2000 units a day  Last vitamin D Lab Results  Component Value Date   VD25OH 27.97 (L) 12/16/2021  A/P: hopefully stable- update vitamin D today. Continue current meds for now   # Hyperglycemia/insulin resistance/prediabetes- a1c 6.0 in 2023 # Morbid obesity S:  Medication: none Exercise and diet- doing exercise class and feels could improve on diet- down 5 lbs from november Lab Results  Component Value Date   HGBA1C 5.9 01/12/2022   HGBA1C 6.0 10/03/2021   HGBA1C 5.8 05/30/2021  A/P: morbid obesity- Encouraged need for healthy eating, regular exercise, weight loss.  Prediabetes- update a1c  #Overactive bladder S: Medication: Vesicare 5 mg daily - rx through urology (can worsen constipation and discussed parkinson risks) A/P: Vesicare very helpful for her symptoms BUT could be contributing to constipation and not ideal for parkinsons - recommended a trial off of medicine to see how she does before seeing Dr. Silverio Decamp- it does seem like constipation worsened around the time she started this about 2 years ago per her recollection    Recommended follow up: Return in about 6 months (around 03/28/2023) for followup or sooner if  needed.Schedule b4 you leave. Future Appointments  Date Time Provider [place]  09/27/2022 11:30 AM [facility] SPEECH THERAPIST [facility]  09/27/2022 11:30 AM MC-R/F 2 [facility]  09/29/2022  9:30 AM Leta Speller I, CCC-SLP [facility]  09/29/2022 10:15 AM [place], Holli Humbles, OT [facility]  10/03/2022  8:45 AM Ellwood Dense Holli Humbles, OT [facility]  10/03/2022  9:30 AM Sharen Counter, CCC-SLP [facility]  10/05/2022  8:30 AM Mauri Pole, MD [facility]  10/06/2022 10:15 AM Kerrie Buffalo, OT [facility]  10/06/2022 11:00 AM Sharen Counter, CCC-SLP [facility]  10/10/2022  9:30 AM Kerrie Buffalo, OT [facility]  10/10/2022 10:15 AM Sharen Counter, CCC-SLP [facility]  10/13/2022 10:15 AM Kerrie Buffalo, OT [facility]  10/13/2022 11:00 AM Sharen Counter, CCC-SLP [facility]  10/17/2022  9:15 AM Tat, Eustace Quail, DO [facility] None  02/15/2023 11:45 AM Gerrit Friends, Barnabas Harries, PT [facility]   Lab/Order associations:   ICD-10-CM   1. Essential hypertension  I10     2. Mixed hyperlipidemia  E78.2     3. Prediabetes  R73.03     4. Parkinson's disease, unspecified whether dyskinesia present, unspecified whether manifestations fluctuate  G20.A1     5. Rheumatoid arthritis, involving unspecified site, unspecified whether rheumatoid factor present ([place])  M06.9     6. Depression with anxiety  F41.8     7. Vitamin D deficiency  E55.9     8. Morbid obesity ([place])  E66.01       No orders of the defined types were placed in this encounter.   Return precautions advised.  Garret Reddish, MD

## 2022-09-27 NOTE — Progress Notes (Signed)
Modified Barium Swallow Progress Note  Patient Details  Name: Kristin [name] MRN: [id number] Date of Birth: Aug 27, 1951  Today's Date: 09/27/2022  Modified Barium Swallow completed.  Full report located under Chart Review in the Imaging Section.  Brief recommendations include the following:  Clinical Impression  Pt demonntrates normal oral and pharyngeal function. There is no evidence of the type of timing impairment or weakness that is somtimes associated with Parkinson's disease and dysphagia. Pt does have prominent cricopharyngeus muscle. Though nothing became lodged there during this study, pts report would be consistent with meats/pills hesitating at the UES and maybe even decreased UES relaxation with fluids. SLP showed the pt on imaging the area in question and described compensatory strategies for clearing residue there. If the pt were to experience a worsening of symptoms an ENT or GI specialist could assess further. Current SLP could f/u for education, but further dysphagia interventions not needed.    Swallow Evaluation Recommendations   Recommended Consults: Consider GI evaluation;Consider ENT evaluation   SLP Diet Recommendations: Regular solids;Thin liquid   Liquid Administration via: Cup;Straw   Medication Administration: Whole meds with puree   Supervision: Patient able to self feed   Compensations: Follow solids with liquid   Postural Changes: Remain semi-upright after after feeds/meals (Comment);Seated upright at 90 degrees   Oral Care Recommendations: Oral care BID        Antionetta Ator, Katherene Ponto 09/27/2022,1:51 PM

## 2022-09-27 NOTE — Patient Instructions (Addendum)
Health Maintenance Due  Topic Date Due   COVID-19 Vaccine (8 - 2023-24 season) 05/05/2022   Medicare Annual Wellness (AWV)  11/04/2022   Vesicare very helpful for her symptoms BUT could be contributing to constipation and not ideal for parkinsons - recommended a trial off of medicine to see how she does before seeing Dr. Silverio Decamp- it does seem like constipation worsened around the time she started this about 2 years ago per her recollection  depression has done very well lately- we opted to trial lexapro 10 mg down form 20 mg and continue cymbalta 30 mg. She will let us know and we can change back if worsening depression  Please call [phone] to schedule a visit with [facility] Dr. Theodis Shove - please tell the office you were directly referred by Dr. Allen Norris sometimes can be backed up for up to 2 months so here are some other options to check in on: - [email or web address]  at  [phone] [facility] [email or web address] at [phone]   Recommended follow up: Return in about 6 months (around 03/28/2023) for followup or sooner if needed.Schedule b4 you leave.

## 2022-09-28 NOTE — Therapy (Deleted)
OUTPATIENT SPEECH LANGUAGE PATHOLOGY PARKINSON'S TREATMENT   Patient Name: Kristin [name] MRN: [id number] DOB:27-Apr-1951, 72 y.o., female Today's Date: 09/29/2022  PCP: Marin Olp MD REFERRING PROVIDER: Alonza Bogus, DO  END OF SESSION:  End of Session - 09/29/22 0925     Visit Number 7    Number of Visits 17    Date for SLP Re-Evaluation 10/31/22    SLP Start Time 0930    SLP Stop Time  7939    SLP Time Calculation (min) 45 min    Activity Tolerance Patient tolerated treatment well                 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   Hyperlipidemia    Hypertension    Infertility, female    Low back pain 10/17/2007   Qualifier: Diagnosis of  By: [name], [name]    Measles as a child   Obesity    Osteoarthritis    Parkinson disease    Plantar fasciitis of left foot 06/19/2012   Preventative health care 12/19/2015   Rheumatoid arthritis ([place]) 01/08/2018   Rosacea 10/05/2014   Swallowing difficulty    Urinary frequency 02/25/2013   Visual floaters 05/11/2014   Past Surgical History:  Procedure Laterality Date   ABDOMINAL HYSTERECTOMY  2006   total   esophageal     stretching   HERNIA REPAIR N/A    laporoscopy     LEFT HEART CATH AND CORONARY ANGIOGRAPHY N/A 06/02/2019   Procedure: LEFT HEART CATH AND CORONARY ANGIOGRAPHY;  Surgeon: Jettie Booze, MD;  Location: [facility];  Service: Cardiovascular;  Laterality: N/A;   TONSILLECTOMY     TOTAL HIP ARTHROPLASTY Right 2010   wisdom teeth extracted     Patient Active Problem List   Diagnosis Date Noted   Vitamin B 12 deficiency 05/30/2021   Anemia 03/16/2020   Coronary artery disease involving native coronary artery of native heart without angina pectoris 03/00/9233   Diastolic dysfunction 00/76/2263   Rheumatoid arthritis ([place]) 01/08/2018   Hand pain, right 10/15/2017   Vitamin D deficiency 12/20/2016   Prediabetes 12/20/2016   Shortness of breath on exertion 11/27/2016   Morbid obesity ([place])    Constipation 12/07/2015   BCC (basal cell carcinoma of skin) 10/05/2014   Rosacea 10/05/2014   Parkinson's disease ([place]) 05/11/2014   Screening for cervical cancer 07/17/2012   Foot pain, bilateral 06/19/2012   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   Depression with anxiety 11/02/2009   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   Lumbar back pain with radiculopathy affecting lower extremity 10/17/2007   Essential hypertension 07/05/2007   Osteoarthritis 07/05/2007   Hyperlipidemia 06/26/2007   H/O: iron deficiency anemia 05/08/2007   Hiatal hernia with GERD 05/08/2007    ONSET DATE: Script dated 08-03-22  REFERRING  DIAG: Parkinson's Disease  THERAPY DIAG:  Dysarthria and anarthria  Dysphagia, unspecified type  Rationale for Evaluation and Treatment: Rehabilitation  SUBJECTIVE:   SUBJECTIVE STATEMENT: " Pt accompanied by: self  PERTINENT HISTORY: See above for PMH. Pt with RA, on methotrexate.   PAIN:  Are you having pain? No  FALLS: Has patient fallen in last 6 months?  No  LIVING ENVIRONMENT: Lives with: lives with their family and lives with their spouse Lives in: House/apartment  PLOF:  Level of assistance: Independent with ADLs Employment: Retired  PATIENT GOALS: Improve communication, and swallowing if necessary  OBJECTIVE:   [place]  (PROM): Communication Effectiveness Survey: pt scored 16/32 (higher scores indicate better QOL) with communicating across a room and over the telephone being "not at all effective".  EAT-10: 13/40 (lower scores indicate better QOL.Pt rated 3/4 (Notable problem) with swallowing solids takes extra effort, swallowing pills takes extra effort, and swallowing is stressful.  TODAY'S TREATMENT:                                                                                                                                         DATE:  09/28/22: MBSS completed yesterday (see below). Provided education of MBSS results, functional implications, and compensatory strategies. Loud /a/ averaged ***. Oral reading at sentence level averaged ***. Subsequent conversation averaged ***.    MBSS (09/28/22): Pt demonstrates normal oral and pharyngeal function. There is no evidence of the type of timing impairment or weakness that is sometimes associated with Parkinson's disease and dysphagia. Pt does have prominent cricopharyngeus muscle. Though nothing became lodged there during this study, pts report would be consistent with meats/pills hesitating at the UES and maybe even decreased UES relaxation with fluids. SLP showed the pt on imaging the area in question and described compensatory strategies for clearing residue there. If the pt were to experience a worsening of symptoms an ENT or GI specialist could assess further. SLP could f/u for education but otherwise dysphagia therapy is not currently needed.   09/26/22: MBS tomorrow. Pt reports thinking about or using louder, intentional speech approx 75% of the time outside of ST. She has noticed people asking her less frequently for repeats. Pt produced loud /a/ with self corrected pressed/strained voice (stopped and corrected her production) with average in low 90s dB. Everyday sentences produced with low-mid 80s dB. Sentence responses with avergae low 70s dB and self-correcting  AB, and correct ID of vocal fry. She and SLP engaged in conversational segments of approx 10 mintues with her maintaining loudness average of upper 60s-low 70s dB with rare min A for AB and maintaining loudness. Educated pt about benefit of external cues; Pt thinking of switching wrists for her watch.  09/13/22: Pt reports being better with homework since last session. Loud "hey" with low 90s dB with less pressed/tight voice than previous session and pt Id'd this (tight  voice) correctly when it occurred. SLP trained pt for loud /a/ by using a strong diaphragm and easy gentle thyroid/neck, and assisted pt with her pitch for /a/ which started too low. Her everyday sentences were completed today with more consistency in the entire sentence maintaining WNL loudness. She  provided bill and coin combinations with WNL loudness and answered simple questions with occasional min A to decr frequency of loudness decay. She was provided with HEP for loud /a/ 5x, everyday sentences, and 10 hard swallows. She was also provided with handouts and the rationale ("develop muscle memory for louder speech") for practicing them as well.   09/11/22: Pt has not practiced to recommended frequency. Sahirah said she will do better with this for next session. Loud "hey" with low 90s dB with some pressed/tight/squeezed voice - pt correctly Id'd this with min A from SLP but then Id'd independently after this. SLP used "hey" and "ah" together to attempt to foster relaxed /a/ but pt unable to completely perform in order to perform at home at this time. Everyday sentences produced mid 80s dB with initial cues necessary for breath and when to initiate voicing. Ranesha had 80% success with WNL volume when reading a phrase and then providing an answer. Homework was provided for this until next session. SLP stressed BID completion of HEP and of homework.  09/06/22: Bedside/clinical swallow eval. See initial eval for oral-motor assessment. Pt reports she  does not really eat meat as much due to a little anxiety about it choking her. She endorsed taking smaller bites and sips in the last 2-3 months, compared to prior to this. Pt told SLP she is taking more care and caution due to fear of choking, with meat, and has almost ceased eating it. SLP had pt add 10 effortful swallows BID to begin to work on swallow strength. If not necessary after MBS, will d/c this exercise.  CLINICAL SWALLOW ASSESSMENT:   Current diet: regular, thin liquids, and current diet modifications: pt does not eat entrees of meat anymore due to fear of choking. Will have meat in a dish, but not entrees. Dentition: adequate natural dentition Patient directly observed with POs: Yes: regular, dysphagia 3 (soft), dysphagia 1 (puree), and thin liquids  Feeding: able to feed self Liquids provided by: cup Oral phase signs and symptoms: prolonged mastication and prolonged bolus formation Pharyngeal phase signs and symptoms: none noted  09/01/22: Pt arrived with WNL volume in simple to mod=complex conversation - for approx 7 minutes and then conversation reverted to sub-WNL until SLP told pt and then she incr'd volume to WNL for another 5 minutes. SLP assisted pt with proper vocal production with "hey" stressing abdominal recruitment. Pt was approx 95-90% successful and was able to ID strain from vocal folds instead of abdominal use. 10 loud "hey" BID was prescribed. Pt also to arrive with 10 everyday sentences next session. SLP highlighted pt's habitual throat clearing and encouraged a small sip of water with a strong swallow. Pt self-cued after a throat clear multiple (x3-4) during the remainder of the session.   Pt was explained rationale for CES and asked to return it next session. Bedside/clinical swallow evaluation next session.  08/22/22: SLP discussed results of evaluation, sharing that pt's "hoarseness" is very likely due to reduced breath support and that after pt engaged in loud  "hey!" Her vocal quality had vastly improved. SLP attributed this to pt's re-familiarization with abdominal engagement when speaking.   PATIENT EDUCATION: Education details: See above in "  today's treatment" Person educated: Patient Education method: Explanation, Demonstration, and Verbal cues Education comprehension: verbalized understanding, returned demonstration, verbal cues required, and needs further education  HOME EXERCISE PROGRAM: "Haaaaaaaaaah" x5, everyday sentences, and 10 effortful swallows   GOALS: Goals reviewed with patient? Yes  SHORT TERM GOALS: Target date: 09/29/22 ***  Pt will undergo bedside swallow evaluation and subsequent MBS if clinically indicated Baseline: Goal status: Met  2.  Pt will demo loud sustained /a/ or "Hey - ahhhhhh" with low 90s dB x4 sessions  Baseline: 09-11-22, 09-13-22, 09/26/22 Goal status: Ongoing  3.  Pt will correctly acknowledge using abdominal musculature/reduced vocal fry in sentence response tasks x3 sessions Baseline: 09/13/22, 09-26-22 Goal status: Ongoing  4.  Pt will demo low 70s dB average in sentence responses in 2 sessions Baseline: 09/26/22 Goal status: Ongoing   LONG TERM GOALS: Target date: 10/31/22  Pt will report higher/better PROM scores than initial session Baseline:  Goal status: Ongoing  2.  Pt will report WNL vocal stamina in conversations >35 minutes between 3 sessions Baseline:  Goal status: Ongoing  3.  Pt will demo WNL volume (low 70s dB average) in 30 minutes conversation with modified independence (external cues PRN) x3 sessions Baseline:  Goal status: Ongoing   ASSESSMENT:  CLINICAL IMPRESSION: Patient is a 72 y.o. female who is being seen for treatment of speech (dysarthria) in light of her Parkinson's Disease. SLP suggesting MBSS, which pt will call to schedule (was called but went to VM). She has decr'd vocal stamina in conversations approx 30-35 minutes in length, and engages in these length  conversations primarily on the phone. Secondly she reports "hoarse" voice which SLP suspects is vocal fry. Pt has begun to note her husband is not asking her to repeat at the same frequency as prior to [place]: Objective impairments include dysarthria and dysphagia. These impairments are limiting patient from household responsibilities, ADLs/IADLs, effectively communicating at home and in community, and safety when swallowing.Factors affecting potential to achieve goals and functional outcome are  none .Marland Kitchen Patient will benefit from skilled SLP services to address above impairments and improve overall function.  REHAB POTENTIAL: Good  PLAN:  SLP FREQUENCY: 2x/week  SLP DURATION: 8 weeks or 17 visits  PLANNED INTERVENTIONS: Aspiration precaution training, Pharyngeal strengthening exercises, Diet toleration management , Environmental controls, Trials of upgraded texture/liquids, Cueing hierachy, Internal/external aids, SLP instruction and feedback, Compensatory strategies, and Patient/family education    Marzetta Board, [place] 09/29/2022, 9:26 AM

## 2022-09-29 ENCOUNTER — Ambulatory Visit: Payer: Medicare Other | Admitting: Occupational Therapy

## 2022-09-29 ENCOUNTER — Ambulatory Visit: Payer: Medicare Other

## 2022-09-29 DIAGNOSIS — M6281 Muscle weakness (generalized): Secondary | ICD-10-CM

## 2022-09-29 DIAGNOSIS — R1312 Dysphagia, oropharyngeal phase: Secondary | ICD-10-CM | POA: Diagnosis not present

## 2022-09-29 DIAGNOSIS — R1310 Dysphagia, unspecified: Secondary | ICD-10-CM

## 2022-09-29 DIAGNOSIS — R471 Dysarthria and anarthria: Secondary | ICD-10-CM

## 2022-09-29 DIAGNOSIS — R29818 Other symptoms and signs involving the nervous system: Secondary | ICD-10-CM

## 2022-09-29 DIAGNOSIS — R278 Other lack of coordination: Secondary | ICD-10-CM

## 2022-09-29 NOTE — Therapy (Signed)
OUTPATIENT SPEECH LANGUAGE PATHOLOGY PARKINSON'S TREATMENT   Patient Name: Kristin [name] MRN: [id number] DOB:01-Mar-1951, 72 y.o., female Today's Date: 09/29/2022  PCP: Marin Olp MD REFERRING PROVIDER: Alonza Bogus, DO  END OF SESSION:  End of Session - 09/29/22 0925     Visit Number 7    Number of Visits 17    Date for SLP Re-Evaluation 10/31/22    SLP Start Time 0935    SLP Stop Time  1015    SLP Time Calculation (min) 40 min    Activity Tolerance Patient tolerated treatment well                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   Hyperlipidemia    Hypertension    Infertility, female    Low back pain 10/17/2007   Qualifier: Diagnosis of  By: [name], [name]    Measles as a child   Obesity    Osteoarthritis    Parkinson disease    Plantar fasciitis of left foot 06/19/2012   Preventative health care 12/19/2015   Rheumatoid arthritis ([place]) 01/08/2018   Rosacea 10/05/2014   Swallowing difficulty    Urinary frequency 02/25/2013   Visual floaters 05/11/2014   Past Surgical History:  Procedure Laterality Date   ABDOMINAL HYSTERECTOMY  2006   total   esophageal     stretching   HERNIA REPAIR N/A    laporoscopy     LEFT HEART CATH AND CORONARY ANGIOGRAPHY N/A 06/02/2019   Procedure: LEFT HEART CATH AND CORONARY ANGIOGRAPHY;  Surgeon: Jettie Booze, MD;  Location: [facility];   Service: Cardiovascular;  Laterality: N/A;   TONSILLECTOMY     TOTAL HIP ARTHROPLASTY Right 2010   wisdom teeth extracted     Patient Active Problem List   Diagnosis Date Noted   Vitamin B 12 deficiency 05/30/2021   Anemia 03/16/2020   Coronary artery disease involving native coronary artery of native heart without angina pectoris 43/32/9518   Diastolic dysfunction 84/16/6063   Rheumatoid arthritis ([place]) 01/08/2018   Hand pain, right 10/15/2017   Vitamin D deficiency 12/20/2016   Prediabetes 12/20/2016   Shortness of breath on exertion 11/27/2016   Morbid obesity ([place])    Constipation 12/07/2015   BCC (basal cell carcinoma of skin) 10/05/2014   Rosacea 10/05/2014   Parkinson's disease ([place]) 05/11/2014   Screening for cervical cancer 07/17/2012   Foot pain, bilateral 06/19/2012   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   Depression with anxiety 11/02/2009   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   Lumbar back pain with radiculopathy affecting lower extremity 10/17/2007   Essential hypertension 07/05/2007   Osteoarthritis 07/05/2007   Hyperlipidemia 06/26/2007   H/O: iron deficiency anemia 05/08/2007   Hiatal hernia with GERD 05/08/2007    ONSET DATE: Script dated 08-03-22  REFERRING DIAG:  Parkinson's Disease  THERAPY DIAG:  Dysarthria and anarthria  Dysphagia, unspecified type  Rationale for Evaluation and Treatment: Rehabilitation  SUBJECTIVE:   SUBJECTIVE STATEMENT: "I practiced, talking with my sisters on Zoom." Pt accompanied by: self  PERTINENT HISTORY: See above for PMH. Pt with RA, on methotrexate.   PAIN:  Are you having pain? No  FALLS: Has patient fallen in last 6 months?  No  LIVING ENVIRONMENT: Lives with: lives with their family and lives with their spouse Lives in: House/apartment  PLOF:  Level of assistance: Independent with ADLs Employment: Retired  PATIENT GOALS: Improve communication, and swallowing if necessary  OBJECTIVE:   Prien (09/27/22) Pt demonntrates normal oral and pharyngeal function. There is no evidence of the type of timing impairment or weakness that is sometimes associated with Parkinson's disease and dysphagia. Pt does have prominent cricopharyngeus muscle. Though nothing became lodged there during this study, pts report would be consistent with meats/pills hesitating at the UES and maybe even decreased UES relaxation with fluids. SLP showed the pt on imaging the area in question and described compensatory strategies for clearing residue there. If the pt were to experience a worsening of symptoms an ENT or GI specialist could assess further. SLP could f/u for education but otherwise dysphagia therapy is not currently needed.   PATIENT REPORTED OUTCOME MEASURES (PROM): Communication Effectiveness Survey: pt scored 16/32 (higher scores indicate better QOL) with communicating across a room and over the telephone being "not at all effective".  EAT-10: 13/40 (lower scores indicate better QOL.Pt rated 3/4 (Notable problem) with swallowing solids takes extra effort, swallowing pills takes extra effort, and swallowing is stressful.  TODAY'S TREATMENT:                                                                                                                                         DATE:  09/29/22: Speech: Pt was at lunch for 2 1/2 hours yesterday and nobody asked her to repeat - pt pleased with this and SLP affirmed this. Loud /a/ today performed with average in low 90s dB, with initial cue to raise pitch for her /a/ (too low initially). Everyday sentences were not brought today but pt made note to bring these next session. Pt has been consistent with HEP daily.  Conversation for 20 minutes with pt maintaining volume of low 70s dB. Pt did not have an external cue, but stated she would do this when returning home. Swallow: SLP discussed results of her MBS with her and explained/educated about CP  bar- her appointment with her GI is next week.   09/26/22: MBS tomorrow. Pt reports thinking about or using louder, intentional speech approx 75% of the time outside of ST. She has noticed people asking her less frequently for repeats. Pt produced loud /a/ with self corrected pressed/strained voice (stopped and corrected her production) with average in low 90s dB. Everyday  sentences produced with low-mid 80s dB. Sentence responses with avergae low 70s dB and self-correcting AB, and correct ID of vocal fry. She and SLP engaged in conversational segments of approx 10 mintues with her maintaining loudness average of upper 60s-low 70s dB with rare min A for AB and maintaining loudness. Educated pt about benefit of external cues; Pt thinking of switching wrists for her watch.  09/13/22: Pt reports being better with homework since last session. Loud "hey" with low 90s dB with less pressed/tight voice than previous session and pt Id'd this (tight voice) correctly when it occurred. SLP trained pt for loud /a/ by using a strong diaphragm and easy gentle thyroid/neck, and assisted pt with her pitch for /a/ which started too low. Her everyday sentences were completed today with more consistency in the entire sentence maintaining WNL loudness. She  provided bill and coin combinations with WNL loudness and answered simple questions with occasional min A to decr frequency of loudness decay. She was provided with HEP for loud /a/ 5x, everyday sentences, and 10 hard swallows. She was also provided with handouts and the rationale ("develop muscle memory for louder speech") for practicing them as well.   09/11/22: Pt has not practiced to recommended frequency. Vanna said she will do better with this for next session. Loud "hey" with low 90s dB with some pressed/tight/squeezed voice - pt correctly Id'd this with min A from SLP but then Id'd independently after this. SLP used "hey" and "ah" together to attempt to foster relaxed /a/  but pt unable to completely perform in order to perform at home at this time. Everyday sentences produced mid 80s dB with initial cues necessary for breath and when to initiate voicing. Sabrea had 80% success with WNL volume when reading a phrase and then providing an answer. Homework was provided for this until next session. SLP stressed BID completion of HEP and of homework.  09/06/22: Bedside/clinical swallow eval. See initial eval for oral-motor assessment. Pt reports she does not really eat meat as much due to a little anxiety about it choking her. She endorsed taking smaller bites and sips in the last 2-3 months, compared to prior to this. Pt told SLP she is taking more care and caution due to fear of choking, with meat, and has almost ceased eating it. SLP had pt add 10 effortful swallows BID to begin to work on swallow strength. If not necessary after MBS, will d/c this exercise. CLINICAL SWALLOW ASSESSMENT:   Current diet: regular, thin liquids, and current diet modifications: pt does not eat entrees of meat anymore due to fear of choking. Will have meat in a dish, but not entrees. Dentition: adequate natural dentition Patient directly observed with POs: Yes: regular, dysphagia 3 (soft), dysphagia 1 (puree), and thin liquids  Feeding: able to feed self Liquids provided by: cup Oral phase signs and symptoms: prolonged mastication and prolonged bolus formation Pharyngeal phase signs and symptoms: none noted  09/01/22: Pt arrived with WNL volume in simple to mod=complex conversation - for approx 7 minutes and then conversation reverted to sub-WNL until SLP told pt and then she incr'd volume to WNL for another 5 minutes. SLP assisted pt with proper vocal production with "hey" stressing abdominal recruitment. Pt was approx 95-90% successful and was able to ID strain from vocal folds instead of abdominal use. 10 loud "hey" BID was prescribed. Pt also to arrive with 10 everyday sentences next session. SLP  highlighted pt's habitual throat clearing and encouraged a  small sip of water with a strong swallow. Pt self-cued after a throat clear multiple (x3-4) during the remainder of the session.   Pt was explained rationale for CES and asked to return it next session. Bedside/clinical swallow evaluation next session.  08/22/22: SLP discussed results of evaluation, sharing that pt's "hoarseness" is very likely due to reduced breath support and that after pt engaged in loud "hey!" Her vocal quality had vastly improved. SLP attributed this to pt's re-familiarization with abdominal engagement when speaking.   PATIENT EDUCATION: Education details: See above in "today's treatment" Person educated: Patient Education method: Explanation, Demonstration, and Verbal cues Education comprehension: verbalized understanding, returned demonstration, verbal cues required, and needs further education  HOME EXERCISE PROGRAM: "Haaaaaaaaaah" x5, everyday sentences, and 10 effortful swallows   GOALS: Goals reviewed with patient? Yes  SHORT TERM GOALS: Target date: 09/29/22  Pt will undergo bedside swallow evaluation and subsequent MBS if clinically indicated Baseline: Goal status: Met  2.  Pt will demo loud sustained /a/ or "Hey - ahhhhhh" with low 90s dB x4 sessions  Baseline: 09-11-22, 09-13-22, 09/26/22 Goal status: Met  3.  Pt will correctly acknowledge using abdominal musculature/reduced vocal fry in sentence response tasks x3 sessions Baseline: 09/13/22, 09-26-22 Goal status: Met  4.  Pt will demo low 70s dB average in sentence responses in 2 sessions Baseline: 09/26/22 Goal status: Met   LONG TERM GOALS: Target date: 10/31/22  Pt will report higher/better PROM scores than initial session Baseline:  Goal status: Ongoing  2.  Pt will report WNL vocal stamina in conversations >35 minutes between 3 sessions Baseline:  Goal status: Ongoing  3.  Pt will demo WNL volume (low 70s dB average) in 30 minutes  conversation with modified independence (external cues PRN) x3 sessions Baseline:  Goal status: Ongoing   ASSESSMENT:  CLINICAL IMPRESSION: Patient is a 72 y.o. female who is being seen for treatment of speech (dysarthria) in light of her Parkinson's Disease. MBSS was WNL except for f/u suggested with GI due to prominent CP bar. She has decr'd vocal stamina in conversations approx 30-35 minutes in length, and engages in these length conversations primarily on the phone. Secondly she reports "hoarse" voice which SLP suspects is vocal fry. Pt has begun to note her husband is not asking her to repeat at the same frequency as prior to [place]: Objective impairments include dysarthria and dysphagia. These impairments are limiting patient from household responsibilities, ADLs/IADLs, effectively communicating at home and in community, and safety when swallowing.Factors affecting potential to achieve goals and functional outcome are  none .Marland Kitchen Patient will benefit from skilled SLP services to address above impairments and improve overall function.  REHAB POTENTIAL: Good  PLAN:  SLP FREQUENCY: 2x/week  SLP DURATION: 8 weeks or 17 visits  PLANNED INTERVENTIONS: Aspiration precaution training, Pharyngeal strengthening exercises, Diet toleration management , Environmental controls, Trials of upgraded texture/liquids, Cueing hierachy, Internal/external aids, SLP instruction and feedback, Compensatory strategies, and Patient/family education    [facility], [place] 09/29/2022, 9:40 AM

## 2022-09-29 NOTE — Therapy (Signed)
OUTPATIENT OCCUPATIONAL THERAPY  Treatment Note  Patient Name: OTA EBERSOLE MRN: [id number] DOB:18-Jun-1951, 72 y.o., female Today's Date: 09/13/2022  PCP: Marin Olp, MD REFERRING PROVIDER: Ludwig Clarks, DO  END OF SESSION:  OT End of Session - 09/29/22 1055     Visit Number 4    Number of Visits 13    Date for OT Re-Evaluation 10/20/22    Authorization Type Medicare A&B    OT Start Time 1020    OT Stop Time 1100    OT Time Calculation (min) 40 min                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   Hyperlipidemia    Hypertension    Infertility, female    Low back pain 10/17/2007   Qualifier: Diagnosis of  By: [name], [name]    Measles as a child   Obesity    Osteoarthritis    Parkinson disease    Plantar fasciitis of left foot 06/19/2012   Preventative health care 12/19/2015   Rheumatoid arthritis ([place]) 01/08/2018   Rosacea 10/05/2014   Swallowing difficulty    Urinary frequency 02/25/2013   Visual floaters 05/11/2014   Past Surgical History:  Procedure Laterality Date   ABDOMINAL HYSTERECTOMY  2006   total   esophageal     stretching   HERNIA REPAIR N/A    laporoscopy     LEFT HEART CATH AND CORONARY ANGIOGRAPHY N/A 06/02/2019   Procedure: LEFT HEART CATH AND CORONARY ANGIOGRAPHY;  Surgeon: Jettie Booze, MD;  Location: [facility];  Service: Cardiovascular;   Laterality: N/A;   TONSILLECTOMY     TOTAL HIP ARTHROPLASTY Right 2010   wisdom teeth extracted     Patient Active Problem List   Diagnosis Date Noted   Vitamin B 12 deficiency 05/30/2021   Anemia 03/16/2020   Coronary artery disease involving native coronary artery of native heart without angina pectoris 21/97/5883   Diastolic dysfunction 25/49/8264   Rheumatoid arthritis ([place]) 01/08/2018   Hand pain, right 10/15/2017   Vitamin D deficiency 12/20/2016   Prediabetes 12/20/2016   Shortness of breath on exertion 11/27/2016   Morbid obesity ([place])    Constipation 12/07/2015   BCC (basal cell carcinoma of skin) 10/05/2014   Rosacea 10/05/2014   Parkinson's disease ([place]) 05/11/2014   Screening for cervical cancer 07/17/2012   Foot pain, bilateral 06/19/2012   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   Depression with anxiety 11/02/2009   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   Lumbar back pain with radiculopathy affecting lower extremity 10/17/2007   Essential hypertension 07/05/2007   Osteoarthritis 07/05/2007   Hyperlipidemia 06/26/2007   H/O: iron deficiency anemia 05/08/2007   Hiatal hernia with GERD 05/08/2007    ONSET DATE: referral date 08/03/22  REFERRING DIAG: G20.B2 (ICD-10-CM) -  Parkinson's disease with dyskinesia and fluctuating manifestations  THERAPY DIAG:  Other symptoms and signs involving the nervous system  Muscle weakness (generalized)  Other lack of coordination  Rationale for Evaluation and Treatment: Rehabilitation  SUBJECTIVE:   SUBJECTIVE STATEMENT: Pt reports that she has started reaching out to people in her PD group to check on them and encourage them to participate in therapy and/or PD resources.  Pt reports that she fears this may be a side effect of one of her medicines. Pt accompanied by: self  PERTINENT HISTORY: PD, rheumatoid arthritis, depression and anxiety  PRECAUTIONS: Fall  WEIGHT BEARING RESTRICTIONS: No  PAIN:  Are you  having pain? No  FALLS: Has patient fallen in last 6 months? No  LIVING ENVIRONMENT: Lives with: lives with their family and lives with their spouse (and adult son lives with them, helps with going to the grocery store, putting away groceries, and odd jobs around the house) Lives in: House/apartment Stairs: Yes: Internal: full flight of steps, but lives on main floor with no need to use steps;  and External: 1 steps; none Has following equipment at home: shower chair, Grab bars, and hand held shower head, and walking stick  PLOF: Independent and Independent with basic ADLs, requires increased time for ADLs  PATIENT GOALS: to get a little more efficient with movements and every day tasks  OBJECTIVE:   HAND DOMINANCE: Left  ADLs: Overall ADLs: Mod I, however reports that it takes quite a bit longer to put on shoes, socks, and jacket Transfers/ambulation related to ADLs: utilizes walking stick with all mobility in home and in community Eating: reports that she has changed what she eats, especially when out at restaurant, with no longer eating soups or salad due to increased time to manage LB Dressing: increased time to put on shoes and socks Equipment: Shower seat with back, Grab bars, Walk in shower, and hand held shower head  IADLs: Shopping: son does the shopping Light housekeeping: laundry, empties the dishwasher Meal Prep: completes all the cooking, reports it does not go "as smoothly" as it has; easily distracted if phone rings or gets distracted by anything Community mobility: still driving Medication management: Mod I, uses weekly pill box Handwriting: 90% legible and Increased time  MOBILITY STATUS: Needs Assist: utilizing walking stick with all mobility  POSTURE COMMENTS:  rounded shoulders  ACTIVITY TOLERANCE: Activity tolerance: WFL, reports taking longer for all ADLs and IADLs  FUNCTIONAL OUTCOME MEASURES: Physical performance test: #1 (handwriting):13.28  sec*     #2 (self-feeding): 13.57 sec     #4 (jacket on/off): 16.25 sec  UPPER EXTREMITY ROM:  WFL bilaterally   UPPER EXTREMITY MMT:   WFL bilaterally   COORDINATION: 9 Hole Peg test: Right: 22.10 sec; Left: 26.90 sec Box and Blocks:  Right 56 blocks (hitting barrier x4), Left 60 blocks Tremors: Resting, LLE  SENSATION: WFL  COGNITION: Overall cognitive status: Within functional limits for tasks assessed  VISION: Subjective report: notes slight change over time Baseline vision: Wears glasses all the time Visual history:  sees a retina specialist, dr does note beginnings of cataracts  VISION ASSESSMENT: Not tested  OBSERVATIONS: mild resting tremor LLE during evaluation   TODAY'S TREATMENT:  09/29/22 Pill box assessment: completed in 5:47.38  Pt making errors by placing too many pills in the AM box by placing two tablets in AM vs "one tablet twice a day".  Pt reports that she has a chart that reflects what should be in her pill box to allow for her to double check herself.  OT reiterated use of memory strategies when filling pill box and taking meds.  Pt utilizing visual chart as well as counting to aid in reducing errors.  Discussed placement of pill bottles to signify completion and/or need for refill (upside down).  Reiterated use of towel under work space to decrease risk of losing pills if dropped.   09/26/22 Self-feeding: pt noticed that she had increased difficulty with eating [place] when she went out with friends.  She reports that she just took the food home and ate it with a spoon.  OT reiterated modifications to grasp and utensil use as able to decrease effects of tremor on self-feeding. Functional reaching: engaged in reaching into overhead cabinet with RUE/LUE to obtain cups and then place on table top.  OT providing initial cues/demonstration for full grasp and purposeful, large amplitude  movements when reaching.  Pt able to recognize when not getting full grasp on cups.   Med management: pt reports dropping pills occasionally.  OT educated on completing med management over top of a colored towel or wash cloth to add layer to deaden the bounce of the pills and provide visual contrast.  Pt completed opening and closing of variety of medication bottles as pt reports difficulty with typical "child proof" type.  Discussed various medication bottles options and encouraged pt to ask her pharmacy if they provide other pill bottle styles to allow for increased ease with opening pill bottles.  Pt dropping pills x1 when dispensing in to hand, however did not fall to floor due to wash cloth on surface.  Coordination: w/ each UE as appropriate, including: picking up various small objects/coins, picking up a small object and translating palm-to-fingertips, and translating palm to fingertips to place in a container.  OT providing cues to pick up items with purposeful grasp and not drag items towards edge of table.   09/13/22 Self-care: OT providing education on hand placement on utensils as well as cups/glasses to decrease effects of tremors during self-feeding tasks.  Pt completed scooping of beans from various bowl depths and with various hand placements to carry over education with closer grip to spoon bowl and shallow height on bowls to increase motor control.  Educated on modifying pressure on utensils and cups to increase grasp and decrease tremors.   Large amplitude: OT educating on large amplitude movements with finger flicks, wrist flexion/extension, supination/pronation, and forward arm flicks with elbow extension.  OT providing verbal cues and demonstration for increased functional carryover.  Pt completed each movement with elbows flexed and elbows extension for 10 while OT providing functional carryover of each position and task.  Provided with handout (see pt instructions).    PATIENT  EDUCATION: Education details: ongoing condition specific education, see above  Person educated: Patient Education method: Consulting civil engineer, Demonstration, Verbal cues, and Handouts Education comprehension: verbalized understanding and returned demonstration  HOME EXERCISE PROGRAM: Large amplitude UE HEP (see pt instructions)   GOALS: Goals reviewed with patient? Yes  SHORT TERM GOALS: Target date: 09/22/22  Pt will be Independent with PD specific HEP. Baseline: Goal status: IN PROGRESS  2.  Pt will verbalize understanding of adaptive techniques, positioning, and  body mechanics to increase independence, ease, and sequencing with ADLs and IADLs. Baseline:  Goal status: MET - 09/26/22    3.  Pt will demonstrate increased ease with dressing as evidenced by decreasing PPT#4(don/ doff jacket) to 12 secs or less Baseline: 16.25 sec Goal status: IN PROGRESS  4.  Pt will demonstrate improved sustained grasp on items to engage in IADLs (emptying dishwasher, setting table, etc.)  Baseline:  Goal status: IN PROGRESS   LONG TERM GOALS: Target date: 10/20/22  Pt will verbalize or demonstrate improvements in speed and efficiency with donning socks and shoes. Baseline:  Goal status: IN PROGRESS  2.  Pt will demonstrate improved ease with feeding as evidenced by decreasing PPT#2 (self feeding) by 3 secs Baseline: 13.57 sec Goal status: IN PROGRESS  3.  Pt will demonstrate improved fine motor coordination for ADLs as evidenced by decreasing 9 hole peg test score for RUE by 3 secs Baseline: Right: 22.10 sec; Left: 26.90 sec Goal status: IN PROGRESS  4.  Pt will demonstrate improved UE functional use for ADLs as evidenced by increasing box/ blocks score by 4 blocks with RUE (hitting barrier </+ to 1 time) Baseline: Right 56 blocks (hitting barrier x4), Left 60 blocks Goal status: IN PROGRESS  5.  Pt will demonstrate ability to engage in dual task IADL activity with good alternating attention to  complete simple functional task (simple snack prep, laundry task, etc) without error. Baseline:  Goal status: IN PROGRESS   ASSESSMENT:  CLINICAL IMPRESSION: Pt reports plan to discuss some changes in habits and becoming perseverative on some tasks that are not in her typical nature with Dr. Carles Collet at upcoming appointment. Pt states that she has had a similar experience in years past and felt that it was related to her medications.  Pt demonstrating errors in recall and adding too many pills to slots during pill box assessment due to decreased use of routines to recall completion of task.  OT reiterated recommendations of moving pill boxes to opposite side of work space, flipping over, and use of counting to decrease risk of errors.  Pt very appreciative of education and writing down key words to facilitate carryover.   PERFORMANCE DEFICITS: in functional skills including ADLs, IADLs, coordination, dexterity, strength, Fine motor control, Gross motor control, mobility, balance, body mechanics, endurance, decreased knowledge of precautions, decreased knowledge of use of DME, and UE functional use, cognitive skills including attention and sequencing, and psychosocial skills including environmental adaptation, habits, and routines and behaviors.   IMPAIRMENTS: are limiting patient from ADLs and IADLs.   CO-MORBIDITIES: may have co-morbidities  that affects occupational performance. Patient will benefit from skilled OT to address above impairments and improve overall function.  MODIFICATION OR ASSISTANCE TO COMPLETE EVALUATION: No modification of tasks or assist necessary to complete an evaluation.  OT OCCUPATIONAL PROFILE AND HISTORY: Problem focused assessment: Including review of records relating to presenting problem.  CLINICAL DECISION MAKING: LOW - limited treatment options, no task modification necessary  REHAB POTENTIAL: Good  EVALUATION COMPLEXITY: Low    PLAN:  OT FREQUENCY:  1-2x/week  OT DURATION: 8 weeks  PLANNED INTERVENTIONS: self care/ADL training, therapeutic exercise, therapeutic activity, neuromuscular re-education, balance training, functional mobility training, ultrasound, compression bandaging, moist heat, cryotherapy, patient/family education, cognitive remediation/compensation, psychosocial skills training, energy conservation, coping strategies training, and DME and/or AE instructions  RECOMMENDED OTHER SERVICES: NA  CONSULTED AND AGREED WITH PLAN OF CARE: Patient  PLAN FOR NEXT SESSION: Review large amplitude movements - complete  reaching in standing and overhead/incorporate PNF reaching, engage in dual tasking for cognitive challenge, Colbert tasks for ROM and strengthening.     Simonne Come, OTR/L 09/29/2022, 10:55 AM

## 2022-10-03 ENCOUNTER — Ambulatory Visit: Payer: Medicare Other | Admitting: Occupational Therapy

## 2022-10-03 ENCOUNTER — Ambulatory Visit: Payer: Medicare Other

## 2022-10-03 DIAGNOSIS — R1310 Dysphagia, unspecified: Secondary | ICD-10-CM | POA: Diagnosis not present

## 2022-10-03 DIAGNOSIS — R278 Other lack of coordination: Secondary | ICD-10-CM | POA: Diagnosis not present

## 2022-10-03 DIAGNOSIS — R1312 Dysphagia, oropharyngeal phase: Secondary | ICD-10-CM | POA: Diagnosis not present

## 2022-10-03 DIAGNOSIS — M6281 Muscle weakness (generalized): Secondary | ICD-10-CM | POA: Diagnosis not present

## 2022-10-03 DIAGNOSIS — R471 Dysarthria and anarthria: Secondary | ICD-10-CM | POA: Diagnosis not present

## 2022-10-03 DIAGNOSIS — R29818 Other symptoms and signs involving the nervous system: Secondary | ICD-10-CM

## 2022-10-03 NOTE — Therapy (Signed)
OUTPATIENT SPEECH LANGUAGE PATHOLOGY PARKINSON'S TREATMENT   Patient Name: Kristin [name] MRN: [id number] DOB:05/20/1951, 72 y.o., female Today's Date: 10/03/2022  PCP: Marin Olp MD REFERRING PROVIDER: Alonza Bogus, DO  END OF SESSION:  End of Session - 10/03/22 1000     Visit Number 8    Number of Visits 17    Date for SLP Re-Evaluation 10/31/22    SLP Start Time 0936    SLP Stop Time  1015    SLP Time Calculation (min) 39 min    Activity Tolerance Patient tolerated treatment well                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   Hyperlipidemia    Hypertension    Infertility, female    Low back pain 10/17/2007   Qualifier: Diagnosis of  By: [name], [name]    Measles as a child   Obesity    Osteoarthritis    Parkinson disease    Plantar fasciitis of left foot 06/19/2012   Preventative health care 12/19/2015   Rheumatoid arthritis ([place]) 01/08/2018   Rosacea 10/05/2014   Swallowing difficulty    Urinary frequency 02/25/2013   Visual floaters 05/11/2014   Past Surgical History:  Procedure Laterality Date   ABDOMINAL HYSTERECTOMY  2006   total   esophageal     stretching   HERNIA REPAIR N/A    laporoscopy     LEFT HEART CATH AND CORONARY ANGIOGRAPHY N/A 06/02/2019   Procedure: LEFT HEART CATH AND CORONARY ANGIOGRAPHY;  Surgeon: Jettie Booze, MD;  Location: [facility];   Service: Cardiovascular;  Laterality: N/A;   TONSILLECTOMY     TOTAL HIP ARTHROPLASTY Right 2010   wisdom teeth extracted     Patient Active Problem List   Diagnosis Date Noted   Vitamin B 12 deficiency 05/30/2021   Anemia 03/16/2020   Coronary artery disease involving native coronary artery of native heart without angina pectoris 74/08/8785   Diastolic dysfunction 76/72/0947   Rheumatoid arthritis ([place]) 01/08/2018   Hand pain, right 10/15/2017   Vitamin D deficiency 12/20/2016   Prediabetes 12/20/2016   Shortness of breath on exertion 11/27/2016   Morbid obesity ([place])    Constipation 12/07/2015   BCC (basal cell carcinoma of skin) 10/05/2014   Rosacea 10/05/2014   Parkinson's disease ([place]) 05/11/2014   Screening for cervical cancer 07/17/2012   Foot pain, bilateral 06/19/2012   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   Depression with anxiety 11/02/2009   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   Lumbar back pain with radiculopathy affecting lower extremity 10/17/2007   Essential hypertension 07/05/2007   Osteoarthritis 07/05/2007   Hyperlipidemia 06/26/2007   H/O: iron deficiency anemia 05/08/2007   Hiatal hernia with GERD 05/08/2007    ONSET DATE: Script dated 08-03-22  REFERRING DIAG:  Parkinson's Disease  THERAPY DIAG:  Dysarthria and anarthria  Dysphagia, unspecified type  Rationale for Evaluation and Treatment: Rehabilitation  SUBJECTIVE:   SUBJECTIVE STATEMENT: "I practiced, talking with my sisters on Zoom." Pt accompanied by: self  PERTINENT HISTORY: See above for PMH. Pt with RA, on methotrexate.   PAIN:  Are you having pain? No  FALLS: Has patient fallen in last 6 months?  No  LIVING ENVIRONMENT: Lives with: lives with their family and lives with their spouse Lives in: House/apartment  PLOF:  Level of assistance: Independent with ADLs Employment: Retired  PATIENT GOALS: Improve communication, and swallowing if necessary  OBJECTIVE:   [place] (09/27/22) Pt demonntrates normal oral and pharyngeal function. There is no evidence of the type of timing impairment or weakness that is sometimes associated with Parkinson's disease and dysphagia. Pt does have prominent cricopharyngeus muscle. Though nothing became lodged there during this study, pts report would be consistent with meats/pills hesitating at the UES and maybe even decreased UES relaxation with fluids. SLP showed the pt on imaging the area in question and described compensatory strategies for clearing residue there. If the pt were to experience a worsening of symptoms an ENT or GI specialist could assess further. SLP could f/u for education but otherwise dysphagia therapy is not currently needed.   PATIENT REPORTED OUTCOME MEASURES (PROM): Communication Effectiveness Survey: pt scored 16/32 (higher scores indicate better QOL) with communicating across a room and over the telephone being "not at all effective".  EAT-10: 13/40 (lower scores indicate better QOL.Pt rated 3/4 (Notable problem) with swallowing solids takes extra effort, swallowing pills takes extra effort, and swallowing is stressful.  TODAY'S TREATMENT:                                                                                                                                         DATE:  10/03/22: Enters with loudness WNL volume. Loud /a/ today performed with average in low 90s. Pt brought everyday sentences and these were copied and left in ST room. Recited today with low-mid 80s dB. Pt ate lunch at a noisy restaurant for 3 hours and engaged in conversation back and forth for the entire time without decr in vocal stamina. Today she and SLP engaged in conversation for 37 minutes and pt maintained average loudness in low 70s. She told SLP there was not a decr in vocal stamina. Pt has worn bracelet as external cue since last session.  09/29/22: Speech: Pt was at lunch for 2 1/2 hours yesterday and  nobody asked her to repeat - pt pleased with this and SLP affirmed this. Loud /a/ today performed with average in low 90s dB, with initial cue to raise pitch for her /a/ (too low initially). Everyday sentences were not brought today but pt made note to bring these next session. Pt has been consistent with HEP daily.  Conversation for 20  minutes with pt maintaining volume of low 70s dB. Pt did not have an external cue, but stated she would do this when returning home. Swallow: SLP discussed results of her MBS with her and explained/educated about CP bar- her appointment with her GI is next week.   09/26/22: MBS tomorrow. Pt reports thinking about or using louder, intentional speech approx 75% of the time outside of ST. She has noticed people asking her less frequently for repeats. Pt produced loud /a/ with self corrected pressed/strained voice (stopped and corrected her production) with average in low 90s dB. Everyday sentences produced with low-mid 80s dB. Sentence responses with avergae low 70s dB and self-correcting AB, and correct ID of vocal fry. She and SLP engaged in conversational segments of approx 10 mintues with her maintaining loudness average of upper 60s-low 70s dB with rare min A for AB and maintaining loudness. Educated pt about benefit of external cues; Pt thinking of switching wrists for her watch.  09/13/22: Pt reports being better with homework since last session. Loud "hey" with low 90s dB with less pressed/tight voice than previous session and pt Id'd this (tight voice) correctly when it occurred. SLP trained pt for loud /a/ by using a strong diaphragm and easy gentle thyroid/neck, and assisted pt with her pitch for /a/ which started too low. Her everyday sentences were completed today with more consistency in the entire sentence maintaining WNL loudness. She  provided bill and coin combinations with WNL loudness and answered simple questions with occasional min A to decr frequency of  loudness decay. She was provided with HEP for loud /a/ 5x, everyday sentences, and 10 hard swallows. She was also provided with handouts and the rationale ("develop muscle memory for louder speech") for practicing them as well.   09/11/22: Pt has not practiced to recommended frequency. Quaniya said she will do better with this for next session. Loud "hey" with low 90s dB with some pressed/tight/squeezed voice - pt correctly Id'd this with min A from SLP but then Id'd independently after this. SLP used "hey" and "ah" together to attempt to foster relaxed /a/ but pt unable to completely perform in order to perform at home at this time. Everyday sentences produced mid 80s dB with initial cues necessary for breath and when to initiate voicing. Jazmynn had 80% success with WNL volume when reading a phrase and then providing an answer. Homework was provided for this until next session. SLP stressed BID completion of HEP and of homework.  09/06/22: Bedside/clinical swallow eval. See initial eval for oral-motor assessment. Pt reports she does not really eat meat as much due to a little anxiety about it choking her. She endorsed taking smaller bites and sips in the last 2-3 months, compared to prior to this. Pt told SLP she is taking more care and caution due to fear of choking, with meat, and has almost ceased eating it. SLP had pt add 10 effortful swallows BID to begin to work on swallow strength. If not necessary after MBS, will d/c this exercise. CLINICAL SWALLOW ASSESSMENT:   Current diet: regular, thin liquids, and current diet modifications: pt does not eat entrees of meat anymore due to fear of choking. Will have meat in a dish, but not entrees. Dentition: adequate natural dentition Patient directly observed with POs: Yes: regular, dysphagia 3 (soft), dysphagia 1 (puree), and thin liquids  Feeding: able to feed self Liquids provided by: cup Oral phase signs and symptoms: prolonged mastication and prolonged bolus  formation  Pharyngeal phase signs and symptoms: none noted  09/01/22: Pt arrived with WNL volume in simple to mod=complex conversation - for approx 7 minutes and then conversation reverted to sub-WNL until SLP told pt and then she incr'd volume to WNL for another 5 minutes. SLP assisted pt with proper vocal production with "hey" stressing abdominal recruitment. Pt was approx 95-90% successful and was able to ID strain from vocal folds instead of abdominal use. 10 loud "hey" BID was prescribed. Pt also to arrive with 10 everyday sentences next session. SLP highlighted pt's habitual throat clearing and encouraged a small sip of water with a strong swallow. Pt self-cued after a throat clear multiple (x3-4) during the remainder of the session.   Pt was explained rationale for CES and asked to return it next session. Bedside/clinical swallow evaluation next session.  08/22/22: SLP discussed results of evaluation, sharing that pt's "hoarseness" is very likely due to reduced breath support and that after pt engaged in loud "hey!" Her vocal quality had vastly improved. SLP attributed this to pt's re-familiarization with abdominal engagement when speaking.   PATIENT EDUCATION: Education details: See above in "today's treatment" Person educated: Patient Education method: Explanation, Demonstration, and Verbal cues Education comprehension: verbalized understanding, returned demonstration, verbal cues required, and needs further education  HOME EXERCISE PROGRAM: "Haaaaaaaaaah" x5, everyday sentences, and 10 effortful swallows   GOALS: Goals reviewed with patient? Yes  SHORT TERM GOALS: Target date: 09/29/22  Pt will undergo bedside swallow evaluation and subsequent MBS if clinically indicated Baseline: Goal status: Met  2.  Pt will demo loud sustained /a/ or "Hey - ahhhhhh" with low 90s dB x4 sessions  Baseline: 09-11-22, 09-13-22, 09/26/22 Goal status: Met  3.  Pt will correctly acknowledge using  abdominal musculature/reduced vocal fry in sentence response tasks x3 sessions Baseline: 09/13/22, 09-26-22 Goal status: Met  4.  Pt will demo low 70s dB average in sentence responses in 2 sessions Baseline: 09/26/22 Goal status: Met   LONG TERM GOALS: Target date: 10/31/22  Pt will report higher/better PROM scores than initial session Baseline:  Goal status: Ongoing  2.  Pt will report WNL vocal stamina in conversations >35 minutes between 3 sessions Baseline: 10/03/22 Goal status: Ongoing  3.  Pt will demo WNL volume (low 70s dB average) in 30 minutes conversation with modified independence (external cues PRN) x3 sessions Baseline: 10/03/22 Goal status: Ongoing   ASSESSMENT:  CLINICAL IMPRESSION: Patient is a 72 y.o. female who is being seen for treatment of speech (dysarthria) in light of her Parkinson's Disease. Pt is making excellent progress with speech volume. MBSS was WNL except for f/u suggested with GI due to prominent CP bar - appointment on Thursday 10/05/22 with MD at [place]. She went to lunch and had 3 hours of conversation with increased vocal stamina. Her "hoarse" voice occurs rarely at this point. Pt has begun to note her husband is not asking her to repeat at the same frequency as prior to [place]: Objective impairments include dysarthria and dysphagia. These impairments are limiting patient from household responsibilities, ADLs/IADLs, effectively communicating at home and in community, and safety when swallowing.Factors affecting potential to achieve goals and functional outcome are  none .Marland Kitchen Patient will benefit from skilled SLP services to address above impairments and improve overall function.  REHAB POTENTIAL: Good  PLAN:  SLP FREQUENCY: 2x/week  SLP DURATION: 8 weeks or 17 visits  PLANNED INTERVENTIONS: Aspiration precaution training, Pharyngeal strengthening exercises, Diet toleration management ,  Environmental controls, Trials of  upgraded texture/liquids, Cueing hierachy, Internal/external aids, SLP instruction and feedback, Compensatory strategies, and Patient/family education    [facility], [place] 10/03/2022, 10:02 AM

## 2022-10-03 NOTE — Therapy (Signed)
OUTPATIENT OCCUPATIONAL THERAPY  Treatment Note  Patient Name: Kristin [name] MRN: [id number] DOB:09-18-1950, 72 y.o., female Today's Date: 09/13/2022  PCP: Marin Olp, MD REFERRING PROVIDER: Ludwig Clarks, DO  END OF SESSION:  OT End of Session - 10/03/22 0929     Visit Number 5    Number of Visits 13    Date for OT Re-Evaluation 10/20/22    Authorization Type Medicare A&B    OT Start Time 8786    OT Stop Time 0930    OT Time Calculation (min) 43 min                 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   Hyperlipidemia    Hypertension    Infertility, female    Low back pain 10/17/2007   Qualifier: Diagnosis of  By: [name], [name]    Measles as a child   Obesity    Osteoarthritis    Parkinson disease    Plantar fasciitis of left foot 06/19/2012   Preventative health care 12/19/2015   Rheumatoid arthritis ([place]) 01/08/2018   Rosacea 10/05/2014   Swallowing difficulty    Urinary frequency 02/25/2013   Visual floaters 05/11/2014   Past Surgical History:  Procedure Laterality Date   ABDOMINAL HYSTERECTOMY  2006   total   esophageal     stretching   HERNIA REPAIR N/A    laporoscopy     LEFT HEART CATH AND CORONARY ANGIOGRAPHY N/A 06/02/2019   Procedure: LEFT HEART CATH AND CORONARY ANGIOGRAPHY;  Surgeon: Jettie Booze, MD;  Location: [facility];  Service: Cardiovascular;   Laterality: N/A;   TONSILLECTOMY     TOTAL HIP ARTHROPLASTY Right 2010   wisdom teeth extracted     Patient Active Problem List   Diagnosis Date Noted   Vitamin B 12 deficiency 05/30/2021   Anemia 03/16/2020   Coronary artery disease involving native coronary artery of native heart without angina pectoris 76/72/0947   Diastolic dysfunction 09/62/8366   Rheumatoid arthritis ([place]) 01/08/2018   Hand pain, right 10/15/2017   Vitamin D deficiency 12/20/2016   Prediabetes 12/20/2016   Shortness of breath on exertion 11/27/2016   Morbid obesity ([place])    Constipation 12/07/2015   BCC (basal cell carcinoma of skin) 10/05/2014   Rosacea 10/05/2014   Parkinson's disease ([place]) 05/11/2014   Screening for cervical cancer 07/17/2012   Foot pain, bilateral 06/19/2012   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   Depression with anxiety 11/02/2009   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   Lumbar back pain with radiculopathy affecting lower extremity 10/17/2007   Essential hypertension 07/05/2007   Osteoarthritis 07/05/2007   Hyperlipidemia 06/26/2007   H/O: iron deficiency anemia 05/08/2007   Hiatal hernia with GERD 05/08/2007    ONSET DATE: referral date 08/03/22  REFERRING DIAG: G20.B2 (  ICD-10-CM) - Parkinson's disease with dyskinesia and fluctuating manifestations  THERAPY DIAG:  Other symptoms and signs involving the nervous system  Muscle weakness (generalized)  Other lack of coordination  Rationale for Evaluation and Treatment: Rehabilitation  SUBJECTIVE:   SUBJECTIVE STATEMENT: Pt reports that she brought in her pill box and her chart. Pt accompanied by: self  PERTINENT HISTORY: PD, rheumatoid arthritis, depression and anxiety  PRECAUTIONS: Fall  WEIGHT BEARING RESTRICTIONS: No  PAIN:  Are you having pain? No  FALLS: Has patient fallen in last 6 months? No  LIVING ENVIRONMENT: Lives with: lives with their family and lives with their spouse (and adult son lives  with them, helps with going to the grocery store, putting away groceries, and odd jobs around the house) Lives in: House/apartment Stairs: Yes: Internal: full flight of steps, but lives on main floor with no need to use steps;  and External: 1 steps; none Has following equipment at home: shower chair, Grab bars, and hand held shower head, and walking stick  PLOF: Independent and Independent with basic ADLs, requires increased time for ADLs  PATIENT GOALS: to get a little more efficient with movements and every day tasks  OBJECTIVE:   HAND DOMINANCE: Left  ADLs: Overall ADLs: Mod I, however reports that it takes quite a bit longer to put on shoes, socks, and jacket Transfers/ambulation related to ADLs: utilizes walking stick with all mobility in home and in community Eating: reports that she has changed what she eats, especially when out at restaurant, with no longer eating soups or salad due to increased time to manage LB Dressing: increased time to put on shoes and socks Equipment: Shower seat with back, Grab bars, Walk in shower, and hand held shower head  IADLs: Shopping: son does the shopping Light housekeeping: laundry, empties the dishwasher Meal Prep: completes all the cooking, reports it does not go "as smoothly" as it has; easily distracted if phone rings or gets distracted by anything Community mobility: still driving Medication management: Mod I, uses weekly pill box Handwriting: 90% legible and Increased time  MOBILITY STATUS: Needs Assist: utilizing walking stick with all mobility  POSTURE COMMENTS:  rounded shoulders  ACTIVITY TOLERANCE: Activity tolerance: WFL, reports taking longer for all ADLs and IADLs  FUNCTIONAL OUTCOME MEASURES: Physical performance test: #1 (handwriting):13.28 sec*     #2 (self-feeding): 13.57 sec     #4 (jacket on/off): 16.25 sec  UPPER EXTREMITY ROM:  WFL bilaterally   UPPER EXTREMITY MMT:   WFL bilaterally   COORDINATION: 9  Hole Peg test: Right: 22.10 sec; Left: 26.90 sec Box and Blocks:  Right 56 blocks (hitting barrier x4), Left 60 blocks Tremors: Resting, LLE  SENSATION: WFL  COGNITION: Overall cognitive status: Within functional limits for tasks assessed  VISION: Subjective report: notes slight change over time Baseline vision: Wears glasses all the time Visual history:  sees a retina specialist, dr does note beginnings of cataracts  VISION ASSESSMENT: Not tested  OBSERVATIONS: mild resting tremor LLE during evaluation   TODAY'S TREATMENT:                                                                             10/03/22 Medication management: Pt  brought in pill box and chart that she uses. Pt demonstrating good organization and checks/balances to insure medication compliance.  Pt reports incorporating towel on table to decrease risk of spilling pills when distributing meds.   Coordination: small peg board pattern replication with use of LUE.  Pt demonstrating good motor control when placing pegs one at a time into peg board.  OT increased challenge to completing with in-hand manipulation and translation with five pegs in hand and translating to finger tips. Pt able to complete with min difficulty, but no dropping of pegs. ADL: discussed strategies to attempt to increase success with jewelry clasps.  Pt continues to report difficulty with very small necklace clasps and some styles of earrings.   09/29/22 Pill box assessment: completed in 5:47.38  Pt making errors by placing too many pills in the AM box by placing two tablets in AM vs "one tablet twice a day".  Pt reports that she has a chart that reflects what should be in her pill box to allow for her to double check herself.  OT reiterated use of memory strategies when filling pill box and taking meds.  Pt utilizing visual chart as well as counting to aid in reducing errors.  Discussed placement of pill bottles to signify completion and/or need for  refill (upside down).  Reiterated use of towel under work space to decrease risk of losing pills if dropped.   09/26/22 Self-feeding: pt noticed that she had increased difficulty with eating [place] when she went out with friends.  She reports that she just took the food home and ate it with a spoon.  OT reiterated modifications to grasp and utensil use as able to decrease effects of tremor on self-feeding. Functional reaching: engaged in reaching into overhead cabinet with RUE/LUE to obtain cups and then place on table top.  OT providing initial cues/demonstration for full grasp and purposeful, large amplitude movements when reaching.  Pt able to recognize when not getting full grasp on cups.   Med management: pt reports dropping pills occasionally.  OT educated on completing med management over top of a colored towel or wash cloth to add layer to deaden the bounce of the pills and provide visual contrast.  Pt completed opening and closing of variety of medication bottles as pt reports difficulty with typical "child proof" type.  Discussed various medication bottles options and encouraged pt to ask her pharmacy if they provide other pill bottle styles to allow for increased ease with opening pill bottles.  Pt dropping pills x1 when dispensing in to hand, however did not fall to floor due to wash cloth on surface.  Coordination: w/ each UE as appropriate, including: picking up various small objects/coins, picking up a small object and translating palm-to-fingertips, and translating palm to fingertips to place in a container.  OT providing cues to pick up items with purposeful grasp and not drag items towards edge of table.  PATIENT EDUCATION: Education details: ongoing condition specific education, see above  Person educated: Patient Education method: Consulting civil engineer, Demonstration, Verbal cues, and Handouts Education comprehension: verbalized understanding and returned demonstration  HOME EXERCISE  PROGRAM: Large amplitude UE HEP (see pt instructions)   GOALS: Goals reviewed with patient? Yes  SHORT TERM GOALS: Target date: 09/22/22  Pt will be Independent with PD specific HEP. Baseline: Goal status: MET - 10/03/22  2.  Pt will verbalize understanding of adaptive techniques, positioning, and body mechanics to increase independence, ease, and sequencing with ADLs and IADLs. Baseline:  Goal status: MET - 09/26/22    3.  Pt will demonstrate increased ease with dressing as evidenced by decreasing PPT#4(don/ doff jacket) to 12 secs or less Baseline: 16.25 sec Goal status: IN PROGRESS  4.  Pt will demonstrate improved sustained grasp on items to engage in IADLs (emptying dishwasher, setting table, etc.)  Baseline:  Goal status: MET - 10/03/22   LONG TERM GOALS: Target date: 10/20/22  Pt will verbalize or demonstrate improvements in speed and efficiency with donning socks and shoes. Baseline:  Goal status: IN PROGRESS  2.  Pt will demonstrate improved ease with feeding as evidenced by decreasing PPT#2 (self feeding) by 3 secs Baseline: 13.57 sec Goal status: IN PROGRESS  3.  Pt will demonstrate improved fine motor coordination for ADLs as evidenced by decreasing 9 hole peg test score for RUE by 3 secs Baseline: Right: 22.10 sec; Left: 26.90 sec Goal status: IN PROGRESS  4.  Pt will demonstrate improved UE functional use for ADLs as evidenced by increasing box/ blocks score by 4 blocks with RUE (hitting barrier </+ to 1 time) Baseline: Right 56 blocks (hitting barrier x4), Left 60 blocks Goal status: IN PROGRESS  5.  Pt will demonstrate ability to engage in dual task IADL activity with good alternating attention to complete simple functional task (simple snack prep, laundry task, etc) without error. Baseline:  Goal status: IN PROGRESS   ASSESSMENT:  CLINICAL IMPRESSION: Pt reports going out to lunch with some individuals from her Parkinson's exercise class.  She reports  they were discussing impact of therapies, particularly OT in every day tasks.  Pt demonstrating good carryover of education in regards to safety with medication management.  Pt demonstrating good in-hand manipulation and translation this session.  Pt very appreciative of education and modifications to routine tasks.  PERFORMANCE DEFICITS: in functional skills including ADLs, IADLs, coordination, dexterity, strength, Fine motor control, Gross motor control, mobility, balance, body mechanics, endurance, decreased knowledge of precautions, decreased knowledge of use of DME, and UE functional use, cognitive skills including attention and sequencing, and psychosocial skills including environmental adaptation, habits, and routines and behaviors.   IMPAIRMENTS: are limiting patient from ADLs and IADLs.   CO-MORBIDITIES: may have co-morbidities  that affects occupational performance. Patient will benefit from skilled OT to address above impairments and improve overall function.  MODIFICATION OR ASSISTANCE TO COMPLETE EVALUATION: No modification of tasks or assist necessary to complete an evaluation.  OT OCCUPATIONAL PROFILE AND HISTORY: Problem focused assessment: Including review of records relating to presenting problem.  CLINICAL DECISION MAKING: LOW - limited treatment options, no task modification necessary  REHAB POTENTIAL: Good  EVALUATION COMPLEXITY: Low    PLAN:  OT FREQUENCY: 1-2x/week  OT DURATION: 8 weeks  PLANNED INTERVENTIONS: self care/ADL training, therapeutic exercise, therapeutic activity, neuromuscular re-education, balance training, functional mobility training, ultrasound, compression bandaging, moist heat, cryotherapy, patient/family education, cognitive remediation/compensation, psychosocial skills training, energy conservation, coping strategies training, and DME and/or AE instructions  RECOMMENDED OTHER SERVICES: NA  CONSULTED AND AGREED WITH PLAN OF CARE:  Patient  PLAN FOR NEXT SESSION: Review large amplitude movements - complete reaching in standing and overhead/incorporate PNF reaching, engage in dual tasking for cognitive challenge, [place] tasks for ROM and strengthening.     Simonne Come, OTR/L 10/03/2022, 9:34 AM

## 2022-10-05 ENCOUNTER — Encounter: Payer: Self-pay | Admitting: Gastroenterology

## 2022-10-05 ENCOUNTER — Ambulatory Visit (INDEPENDENT_AMBULATORY_CARE_PROVIDER_SITE_OTHER): Payer: Medicare Other | Admitting: Gastroenterology

## 2022-10-05 VITALS — BP 126/72 | HR 74

## 2022-10-05 DIAGNOSIS — K219 Gastro-esophageal reflux disease without esophagitis: Secondary | ICD-10-CM

## 2022-10-05 DIAGNOSIS — K5904 Chronic idiopathic constipation: Secondary | ICD-10-CM

## 2022-10-05 DIAGNOSIS — L570 Actinic keratosis: Secondary | ICD-10-CM | POA: Diagnosis not present

## 2022-10-05 DIAGNOSIS — K581 Irritable bowel syndrome with constipation: Secondary | ICD-10-CM

## 2022-10-05 DIAGNOSIS — R1310 Dysphagia, unspecified: Secondary | ICD-10-CM

## 2022-10-05 DIAGNOSIS — L821 Other seborrheic keratosis: Secondary | ICD-10-CM | POA: Diagnosis not present

## 2022-10-05 MED ORDER — TRULANCE 3 MG PO TABS: 3.0000 mg | ORAL_TABLET | Freq: Every day | ORAL | 1 refills | Status: DC

## 2022-10-05 NOTE — Progress Notes (Signed)
Kristin [name]    [id number]    25-Oct-1950  Primary Care Physician:Hunter, Brayton Mars, MD  Referring Physician: Marin Olp, MD [name],  [name] [address]   Chief complaint:  Dysphagia, constipation, belching  HPI:  72 year old very pleasant female with history of Parkinson's disease, rheumatoid arthritis, large hiatal hernia s/p repair with mesh and gastropexy 2022 here for follow-up visit with c/o dysphagia and worsening constipation.  She is choking with both liquids and solids. She had modified barium swallow.  She is feeling epigastric pressure and has constant belching.   Worsening constipation with irregular BM once every 1-2 weeks.   EGD June 07, 2020 - Benign-appearing esophageal stenosis. Dilated. - Large hiatal hernia. - Erosive gastropathy with no stigmata of recent bleeding associated with the hiatal hernia, c/w Cameron erosions. - Multiple gastric polyps. - Normal duodenal bulb and second portion of the duodenum. Biopsied.   Colonoscopy June 07, 2020 -Mild diverticulosis in the sigmoid colon. There was evidence of an impacted diverticulum. - Internal hemorrhoids. - The examination was otherwise normal on direct and retroflexion views.     Outpatient Encounter Medications as of 10/05/2022  Medication Sig   ALPRAZolam (XANAX) 0.25 MG tablet TAKE 1 TABLET BY MOUTH 2 TIMES DAILY AS NEEDED FOR ANXIETY.   carbidopa-levodopa (SINEMET CR) 50-200 MG tablet TAKE 1 TABLET BY MOUTH EVERYDAY AT BEDTIME   carbidopa-levodopa (SINEMET IR) 25-100 MG tablet TAKE 2 TABLETS BY MOUTH AT 7AM, 2TABLETS AT 11AM, 1 TABLET AT 4PM   carvedilol (COREG) 3.125 MG tablet TAKE 1 TABLET BY MOUTH TWICE A DAY WITH MEALS   DULoxetine (CYMBALTA) 30 MG capsule TAKE 1 CAPSULE BY MOUTH EVERY DAY   escitalopram (LEXAPRO) 10 MG tablet Take 1 tablet (10 mg total) by mouth daily.   folic acid (FOLVITE) 1 MG tablet Take 1 mg by mouth 2 (two) times daily.    linaclotide (LINZESS) 145 MCG CAPS capsule Take 1 capsule (145 mcg total) by mouth daily before breakfast.   losartan (COZAAR) 50 MG tablet TAKE 1 TABLET BY MOUTH EVERY DAY   omeprazole (PRILOSEC) 20 MG capsule TAKE 1 CAPSULE BY MOUTH 2 TIMES DAILY AS NEEDED.   polyethylene glycol (MIRALAX / GLYCOLAX) 17 g packet Take 17 g by mouth daily.   pramipexole (MIRAPEX) 0.5 MG tablet Take 1 tablet (0.5 mg total) by mouth 3 (three) times daily.   rosuvastatin (CRESTOR) 10 MG tablet TAKE 1 TABLET BY MOUTH ON MONDAYS,WEDNESDAYS, AND FRIDAYS   Vitamin D, Ergocalciferol, (DRISDOL) 1.25 MG (50000 UNIT) CAPS capsule Take 1 capsule (50,000 Units total) by mouth every 7 (seven) days.   [DISCONTINUED] solifenacin (VESICARE) 5 MG tablet Take 5 mg by mouth daily.   No facility-administered encounter medications on file as of 10/05/2022.    Allergies as of 10/05/2022 - Review Complete 10/05/2022  Allergen Reaction Noted   Scopolamine Other (See Comments) 12/14/2020   Prednisone Dermatitis 12/04/2011   Bactrim [sulfamethoxazole-trimethoprim] Other (See Comments) 08/27/2012   Pb-hyoscy-atropine-scopolamine [phenobarbital-belladonna alk] Other (See Comments) 04/12/2021   Penicillins Hives and Other (See Comments) 08/14/1971   Sulfa antibiotics  08/04/2012    Past Medical History:  Diagnosis Date   Abnormal vaginal Pap smear    Acute pharyngitis 02/25/2013   Anemia    Anxiety    Arthritis    BCC (basal cell carcinoma of skin) 10/05/2014   On back   Chicken pox as a child   Chronic UTI  sees dr Terance Hart   Constipation 12/07/2015   Depression with anxiety 11/02/2009   Qualifier: Diagnosis of  By: [name], LPN, Bonnye M    Dermatitis 07/17/2012   Esophageal stricture 1994   Fibroids    Foot pain, bilateral 06/19/2012   GERD (gastroesophageal reflux disease)    Hiatal hernia    Hyperglycemia 08/19/2013   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   Hyperlipidemia    Hypertension    Infertility, female     Low back pain 10/17/2007   Qualifier: Diagnosis of  By: [name], [name]    Measles as a child   Obesity    Osteoarthritis    Parkinson disease    Plantar fasciitis of left foot 06/19/2012   Preventative health care 12/19/2015   Rheumatoid arthritis ([place]) 01/08/2018   Rosacea 10/05/2014   Swallowing difficulty    Urinary frequency 02/25/2013   Visual floaters 05/11/2014    Past Surgical History:  Procedure Laterality Date   ABDOMINAL HYSTERECTOMY  2006   total   esophageal     stretching   HERNIA REPAIR N/A    laporoscopy     LEFT HEART CATH AND CORONARY ANGIOGRAPHY N/A 06/02/2019   Procedure: LEFT HEART CATH AND CORONARY ANGIOGRAPHY;  Surgeon: Jettie Booze, MD;  Location: [facility];  Service: Cardiovascular;  Laterality: N/A;   TONSILLECTOMY     TOTAL HIP ARTHROPLASTY Right 2010   wisdom teeth extracted      Family History  Problem Relation Age of Onset   Other Mother        arrythmia   Mental illness Mother        bipolar   Hyperlipidemia Mother    Thyroid disease Mother    Depression Mother    Bipolar disorder Mother    Breast cancer Mother 104   Heart disease Father    Arthritis Father        rheumatoid   Hypertension Father    Hyperlipidemia Father    Depression Sister    Mental illness Sister        bipolar   Parkinson's disease Sister    Arthritis Sister    Arthritis Sister    Arthritis Sister    Breast cancer Maternal Aunt 80   Breast cancer Maternal Uncle        82s   Heart attack Paternal Uncle    Heart attack Paternal Grandfather    Breast cancer Cousin 4   Colon cancer Neg Hx    Esophageal cancer Neg Hx    Rectal cancer Neg Hx    Stomach cancer Neg Hx     Social History   Socioeconomic History   Marital status: Married    Spouse name: Not on file   Number of children: 1   Years of education: Not on file   Highest education level: Master's degree (e.g., MA, MS, MEng, MEd, MSW, MBA)  Occupational History    Occupation: Retired Teacher, music  Tobacco Use   Smoking status: Never   Smokeless tobacco: Never  Vaping Use   Vaping Use: Never used  Substance and Sexual Activity   Alcohol use: Yes    Comment: occasional wine   Drug use: No   Sexual activity: Not Currently    Partners: Male  Other Topics Concern   Not on file  Social History Narrative   Lives with husband, 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 3 grandsons- 2 in [place] and 35 year old in [place] in 2023      Hobbies: book club, time with friends, time with couples friends, plays pianos   Social Determinants of Health   Financial Resource Strain: Low Risk  (11/03/2021)   Overall Financial Resource Strain (CARDIA)    Difficulty of Paying Living Expenses: Not hard at all  Food Insecurity: No Food Insecurity (11/03/2021)   Hunger Vital Sign    Worried About Running Out of Food in the Last Year: Never true    Ran Out of Food in the Last Year: Never true  Transportation Needs: No Transportation Needs (11/03/2021)   PRAPARE - Hydrologist (Medical): No    Lack of Transportation (Non-Medical): No  Physical Activity: Sufficiently Active (12/07/2021)   Exercise Vital Sign    Days of Exercise per Week: 5 days    Minutes of Exercise per Session: 60 min  Stress: No Stress Concern Present (11/03/2021)   [place]    Feeling of Stress : Not at all  Social Connections: Moderately Integrated (11/03/2021)   Social Connection and Isolation Panel [NHANES]    Frequency of Communication with Friends and Family: More than three times a week    Frequency of Social Gatherings with Friends and Family: More than three times a week    Attends Religious Services: Never    Marine scientist or Organizations: Yes    Attends Music therapist: More than  4 times per year    Marital Status: Married  Human resources officer Violence: Not At Risk (11/03/2021)   Humiliation, Afraid, Rape, and Kick questionnaire    Fear of Current or Ex-Partner: No    Emotionally Abused: No    Physically Abused: No    Sexually Abused: No      Review of systems: All other review of systems negative except as mentioned in the HPI.   Physical Exam: Vitals:   10/05/22 0825  BP: 126/72  Pulse: 74   There is no height or weight on file to calculate BMI. Gen:      No acute distress HEENT:  sclera anicteric Abd:      soft, non-tender; no palpable masses, no distension Ext:    No edema Neuro: alert and oriented x 3 Psych: normal mood and affect  Data Reviewed:  Reviewed labs, radiology imaging, old records and pertinent past GI work up   Assessment and Plan/Recommendations:  72 year old very pleasant female with history of Parkinson's disease, rheumatoid arthritis with dysphagia, GERD, worsening chronic idiopathic constipation, abdominal bloating and excess gas   Chronic idiopathic constipation and dyssynergic defecation:  No improvement with Linzess 221mg Switch to Trulance 3 mg Increase dietary fiber and water intake  Dysphagia: schedule for EGD for evaluation of possible esophageal stricture and dilation The risks and benefits as well as alternatives of endoscopic procedure(s) have been discussed and reviewed. All questions answered. The patient agrees to proceed.  Obtain esophageal barium swallow to exclude dysmotility    This visit required >40 minutes of patient care (this includes precharting, chart review, review of results, face-to-face time used for counseling as well as treatment plan and follow-up. The patient was provided an opportunity to ask questions and all were answered. The patient agreed with the plan and demonstrated an understanding of the instructions.  KDamaris Hippo, MD    CC: HMarin Olp  MD   

## 2022-10-05 NOTE — Patient Instructions (Addendum)
You have been scheduled for an endoscopy. Please follow written instructions given to you at your visit today. If you use inhalers (even only as needed), please bring them with you on the day of your procedure.   We have sent the following medications to your pharmacy for you to pick up at your convenience:  Trulance   Due to recent changes in healthcare laws, you may see the results of your imaging and laboratory studies on MyChart before your provider has had a chance to review them.  We understand that in some cases there may be results that are confusing or concerning to you. Not all laboratory results come back in the same time frame and the provider may be waiting for multiple results in order to interpret others.  Please give [place] 48 hours in order for your provider to thoroughly review all the results before contacting the office for clarification of your results.    _______________________________________________________  If your blood pressure at your visit was 140/90 or greater, please contact your primary care physician to follow up on this.  _______________________________________________________  If you are age 35 or older, your body mass index should be between 23-30. Your There is no height or weight on file to calculate BMI. If this is out of the aforementioned range listed, please consider follow up with your Primary Care Provider.  If you are age 74 or younger, your body mass index should be between 19-25. Your There is no height or weight on file to calculate BMI. If this is out of the aformentioned range listed, please consider follow up with your Primary Care Provider.   ________________________________________________________  The [facility] providers would like to encourage you to use [facility] to communicate with providers for non-urgent requests or questions.  Due to long hold times on the telephone, sending your provider a message by [facility] may be a faster and more  efficient way to get a response.  Please allow 48 business hours for a response.  Please remember that this is for non-urgent requests.  _______________________________________________________   Thank you for choosing [facility]  Kavitha Nandigam,MD

## 2022-10-06 ENCOUNTER — Ambulatory Visit: Payer: Medicare Other | Admitting: Occupational Therapy

## 2022-10-06 ENCOUNTER — Encounter: Payer: Self-pay | Admitting: Gastroenterology

## 2022-10-06 ENCOUNTER — Ambulatory Visit (AMBULATORY_SURGERY_CENTER): Payer: Medicare Other | Admitting: Gastroenterology

## 2022-10-06 ENCOUNTER — Ambulatory Visit: Payer: Medicare Other

## 2022-10-06 VITALS — BP 103/61 | HR 76 | Temp 98.8°F | Resp 16 | Ht 63.0 in | Wt 236.0 lb

## 2022-10-06 DIAGNOSIS — K2100 Gastro-esophageal reflux disease with esophagitis, without bleeding: Secondary | ICD-10-CM | POA: Diagnosis not present

## 2022-10-06 DIAGNOSIS — R1310 Dysphagia, unspecified: Secondary | ICD-10-CM

## 2022-10-06 DIAGNOSIS — Z538 Procedure and treatment not carried out for other reasons: Secondary | ICD-10-CM | POA: Diagnosis not present

## 2022-10-06 DIAGNOSIS — R1013 Epigastric pain: Secondary | ICD-10-CM | POA: Diagnosis not present

## 2022-10-06 MED ORDER — SODIUM CHLORIDE 0.9 % IV SOLN: 500.0000 mL | Freq: Once | INTRAVENOUS | Status: DC

## 2022-10-06 NOTE — Progress Notes (Signed)
Please refer to office visit note 10/05/22. No additional changes in H&P Patient is appropriate for planned procedure(s) and anesthesia in an ambulatory setting  K. Veena Garren Greenman , MD [phone]   

## 2022-10-06 NOTE — Op Note (Signed)
[place] Patient Name: Kristin [name] Procedure Date: 10/06/2022 9:20 AM MRN: [id number] Endoscopist: Mauri Pole , MD, [id number] Age: 72 Referring MD:  Date of Birth: 05-Jan-1951 Gender: Female Account #: [id number] Procedure:                Upper GI endoscopy Indications:              Dysphagia, Epigastric abdominal pain Medicines:                Monitored Anesthesia Care Procedure:                Pre-Anesthesia Assessment:                           - Prior to the procedure, a History and Physical                            was performed, and patient medications and                            allergies were reviewed. The patient's tolerance of                            previous anesthesia was also reviewed. The risks                            and benefits of the procedure and the sedation                            options and risks were discussed with the patient.                            All questions were answered, and informed consent                            was obtained. Prior Anticoagulants: The patient has                            taken no anticoagulant or antiplatelet agents. ASA                            Grade Assessment: II - A patient with mild systemic                            disease. After reviewing the risks and benefits,                            the patient was deemed in satisfactory condition to                            undergo the procedure.                           After obtaining informed consent, the endoscope was  passed under direct vision. Throughout the                            procedure, the patient's blood pressure, pulse, and                            oxygen saturations were monitored continuously. The                            GIF [id number] was introduced through the                            mouth, with the intention of advancing to the                            duodenum. The scope  was advanced to the gastric                            cardia before the procedure was aborted.                            Medications were given. The upper GI endoscopy was                            technically difficult and complex due to presence                            of food. The patient tolerated the procedure well. Scope In: Scope Out: Findings:                 The Z-line was regular and was found 38 cm from the                            incisors.                           LA Grade B (one or more mucosal breaks greater than                            5 mm, not extending between the tops of two mucosal                            folds) esophagitis with no bleeding was found 34 to                            38 cm from the incisors.                           A large amount of food (residue) was found in the                            entire examined stomach. Procedure was aborted Complications:            No immediate complications.  Estimated Blood Loss:     Estimated blood loss: none. Impression:               - Z-line regular, 38 cm from the incisors.                           - LA Grade B reflux esophagitis with no bleeding.                           - A large amount of food (residue) in the stomach.                            Procedure was aborted.                           - No specimens collected. Recommendation:           - Patient has a contact number available for                            emergencies. The signs and symptoms of potential                            delayed complications were discussed with the                            patient. Return to normal activities tomorrow.                            Written discharge instructions were provided to the                            patient.                           - Gastroparesis diet.                           - Small frequent meals                           - Will obtain 4 hour gastric emptying scan                            - Continue present medications.                           - Return to GI office at the next available                            appointment. Mauri Pole, MD 10/06/2022 9:48:09 AM This report has been signed electronically.

## 2022-10-06 NOTE — Progress Notes (Signed)
Pt's states no medical or surgical changes since previsit or office visit. 

## 2022-10-06 NOTE — Progress Notes (Signed)
To pacu, VSS. Report to [email or web address].tb 

## 2022-10-06 NOTE — Patient Instructions (Signed)
Handout provided for gastroparesis diet.  Small frequent meals.  Will obtain 4 hour gastric emptying scan.  Continue present medications.  Return to GI office at the next available appointment.   YOU HAD AN ENDOSCOPIC PROCEDURE TODAY AT [facility]:   Refer to the procedure report that was given to you for any specific questions about what was found during the examination.  If the procedure report does not answer your questions, please call your gastroenterologist to clarify.  If you requested that your care partner not be given the details of your procedure findings, then the procedure report has been included in a sealed envelope for you to review at your convenience later.  YOU SHOULD EXPECT: Some feelings of bloating in the abdomen. Passage of more gas than usual.  Walking can help get rid of the air that was put into your GI tract during the procedure and reduce the bloating. If you had a lower endoscopy (such as a colonoscopy or flexible sigmoidoscopy) you may notice spotting of blood in your stool or on the toilet paper. If you underwent a bowel prep for your procedure, you may not have a normal bowel movement for a few days.  Please Note:  You might notice some irritation and congestion in your nose or some drainage.  This is from the oxygen used during your procedure.  There is no need for concern and it should clear up in a day or so.  SYMPTOMS TO REPORT IMMEDIATELY:   Following upper endoscopy (EGD)  Vomiting of blood or coffee ground material  New chest pain or pain under the shoulder blades  Painful or persistently difficult swallowing  New shortness of breath  Fever of 100F or higher  Black, tarry-looking stools  For urgent or emergent issues, a gastroenterologist can be reached at any hour by calling [phone]. Do not use MyChart messaging for urgent concerns.    DIET:  We do recommend a small meal at first, but then you may proceed to your regular diet.   Drink plenty of fluids but you should avoid alcoholic beverages for 24 hours.  ACTIVITY:  You should plan to take it easy for the rest of today and you should NOT DRIVE or use heavy machinery until tomorrow (because of the sedation medicines used during the test).    FOLLOW UP: Our staff will call the number listed on your records the next business day following your procedure.  We will call around 7:15- 8:00 am to check on you and address any questions or concerns that you may have regarding the information given to you following your procedure. If we do not reach you, we will leave a message.     If any biopsies were taken you will be contacted by phone or by letter within the next 1-3 weeks.  Please call us at [phone] if you have not heard about the biopsies in 3 weeks.    SIGNATURES/CONFIDENTIALITY: You and/or your care partner have signed paperwork which will be entered into your electronic medical record.  These signatures attest to the fact that that the information above on your After Visit Summary has been reviewed and is understood.  Full responsibility of the confidentiality of this discharge information lies with you and/or your care-partner.

## 2022-10-09 ENCOUNTER — Telehealth: Payer: Self-pay | Admitting: Pharmacy Technician

## 2022-10-09 ENCOUNTER — Telehealth: Payer: Self-pay

## 2022-10-09 ENCOUNTER — Other Ambulatory Visit (HOSPITAL_COMMUNITY): Payer: Self-pay

## 2022-10-09 NOTE — Telephone Encounter (Signed)
Patient Advocate Encounter  Received notification from [facility] that prior authorization for TRULANCE '3MG'$  is required.   PA submitted on 2.5.24 Key [id number] Status is pending

## 2022-10-09 NOTE — Telephone Encounter (Signed)
Follow up call placed, VM obtained and message left. 

## 2022-10-10 ENCOUNTER — Ambulatory Visit: Payer: Medicare Other | Admitting: Occupational Therapy

## 2022-10-10 ENCOUNTER — Ambulatory Visit: Payer: Medicare Other | Attending: Neurology

## 2022-10-10 DIAGNOSIS — R278 Other lack of coordination: Secondary | ICD-10-CM | POA: Diagnosis not present

## 2022-10-10 DIAGNOSIS — R471 Dysarthria and anarthria: Secondary | ICD-10-CM | POA: Insufficient documentation

## 2022-10-10 DIAGNOSIS — R1312 Dysphagia, oropharyngeal phase: Secondary | ICD-10-CM

## 2022-10-10 DIAGNOSIS — R29818 Other symptoms and signs involving the nervous system: Secondary | ICD-10-CM

## 2022-10-10 DIAGNOSIS — M6281 Muscle weakness (generalized): Secondary | ICD-10-CM | POA: Diagnosis not present

## 2022-10-10 NOTE — Therapy (Signed)
OUTPATIENT OCCUPATIONAL THERAPY  Treatment Note  Patient Name: Kristin [name] MRN: [id number] DOB:03-May-1951, 72 y.o., female Today's Date: 09/13/2022  PCP: Marin Olp, MD REFERRING PROVIDER: Ludwig Clarks, DO  END OF SESSION:  OT End of Session - 10/10/22 1004     Visit Number 6    Number of Visits 13    Date for OT Re-Evaluation 10/20/22    Authorization Type Medicare A&B    OT Start Time 0931    OT Stop Time 1015    OT Time Calculation (min) 44 min                  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   Hyperlipidemia    Hypertension    Infertility, female    Low back pain 10/17/2007   Qualifier: Diagnosis of  By: [name], [name]    Measles as a child   Obesity    Osteoarthritis    Parkinson disease    Plantar fasciitis of left foot 06/19/2012   Preventative health care 12/19/2015   Rheumatoid arthritis ([place]) 01/08/2018   Rosacea 10/05/2014   Swallowing difficulty    Urinary frequency 02/25/2013   Visual floaters 05/11/2014   Past Surgical History:  Procedure Laterality Date   ABDOMINAL HYSTERECTOMY  2006   total   esophageal     stretching   HERNIA REPAIR N/A 2022   hiatal hernia   laporoscopy     LEFT HEART CATH AND CORONARY ANGIOGRAPHY N/A 06/02/2019   Procedure: LEFT HEART CATH AND CORONARY ANGIOGRAPHY;  Surgeon: Jettie Booze, MD;  Location: [facility];   Service: Cardiovascular;  Laterality: N/A;   TONSILLECTOMY     TOTAL HIP ARTHROPLASTY Right 2010   UPPER GASTROINTESTINAL ENDOSCOPY     wisdom teeth extracted     Patient Active Problem List   Diagnosis Date Noted   Vitamin B 12 deficiency 05/30/2021   Anemia 03/16/2020   Coronary artery disease involving native coronary artery of native heart without angina pectoris 46/65/9935   Diastolic dysfunction 70/17/7939   Rheumatoid arthritis ([place]) 01/08/2018   Hand pain, right 10/15/2017   Vitamin D deficiency 12/20/2016   Prediabetes 12/20/2016   Shortness of breath on exertion 11/27/2016   Morbid obesity ([place])    Constipation 12/07/2015   BCC (basal cell carcinoma of skin) 10/05/2014   Rosacea 10/05/2014   Parkinson's disease ([place]) 05/11/2014   Screening for cervical cancer 07/17/2012   Foot pain, bilateral 06/19/2012   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   Depression with anxiety 11/02/2009   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   Lumbar back pain with radiculopathy affecting lower extremity 10/17/2007   Essential hypertension 07/05/2007   Osteoarthritis 07/05/2007   Hyperlipidemia 06/26/2007   H/O: iron deficiency anemia 05/08/2007   Hiatal hernia with GERD 05/08/2007  ONSET DATE: referral date 08/03/22  REFERRING DIAG: G20.B2 (ICD-10-CM) - Parkinson's disease with dyskinesia and fluctuating manifestations  THERAPY DIAG:  Other symptoms and signs involving the nervous system  Muscle weakness (generalized)  Other lack of coordination  Rationale for Evaluation and Treatment: Rehabilitation  SUBJECTIVE:   SUBJECTIVE STATEMENT: Pt reports her hips and back are sore today.  She states that they had not been walking much, but that she and her husband walked yesterday.  Pt accompanied by: self  PERTINENT HISTORY: PD, rheumatoid arthritis, depression and anxiety  PRECAUTIONS: Fall  WEIGHT BEARING RESTRICTIONS: No  PAIN:  Are you having pain? No  FALLS: Has  patient fallen in last 6 months? No  LIVING ENVIRONMENT: Lives with: lives with their family and lives with their spouse (and adult son lives with them, helps with going to the grocery store, putting away groceries, and odd jobs around the house) Lives in: House/apartment Stairs: Yes: Internal: full flight of steps, but lives on main floor with no need to use steps;  and External: 1 steps; none Has following equipment at home: shower chair, Grab bars, and hand held shower head, and walking stick  PLOF: Independent and Independent with basic ADLs, requires increased time for ADLs  PATIENT GOALS: to get a little more efficient with movements and every day tasks  OBJECTIVE:   HAND DOMINANCE: Left  ADLs: Overall ADLs: Mod I, however reports that it takes quite a bit longer to put on shoes, socks, and jacket Transfers/ambulation related to ADLs: utilizes walking stick with all mobility in home and in community Eating: reports that she has changed what she eats, especially when out at restaurant, with no longer eating soups or salad due to increased time to manage LB Dressing: increased time to put on shoes and socks Equipment: Shower seat with back, Grab bars, Walk in shower, and hand held shower head  IADLs: Shopping: son does the shopping Light housekeeping: laundry, empties the dishwasher Meal Prep: completes all the cooking, reports it does not go "as smoothly" as it has; easily distracted if phone rings or gets distracted by anything Community mobility: still driving Medication management: Mod I, uses weekly pill box Handwriting: 90% legible and Increased time  MOBILITY STATUS: Needs Assist: utilizing walking stick with all mobility  POSTURE COMMENTS:  rounded shoulders  ACTIVITY TOLERANCE: Activity tolerance: WFL, reports taking longer for all ADLs and IADLs  FUNCTIONAL OUTCOME MEASURES: Physical performance test: #1 (handwriting):13.28 sec*     #2 (self-feeding): 13.57  sec     #4 (jacket on/off): 16.25 sec  UPPER EXTREMITY ROM:  WFL bilaterally   UPPER EXTREMITY MMT:   WFL bilaterally   COORDINATION: 9 Hole Peg test: Right: 22.10 sec; Left: 26.90 sec Box and Blocks:  Right 56 blocks (hitting barrier x4), Left 60 blocks Tremors: Resting, LLE  SENSATION: WFL  COGNITION: Overall cognitive status: Within functional limits for tasks assessed  VISION: Subjective report: notes slight change over time Baseline vision: Wears glasses all the time Visual history:  sees a retina specialist, dr does note beginnings of cataracts  VISION ASSESSMENT: Not tested  OBSERVATIONS: mild resting tremor LLE during evaluation   TODAY'S TREATMENT:  10/10/22 Large amplitude: engaged in card flipping with focus on large amplitude forearm supination and hand opening.  OT providing min cues for increased amplitude. Sliding cards off deck with thumb with focus on large amplitude push from thumb.  Completed bilaterally, noted decreased motor control and amplitude in dominant L hand.   Self-feeding: Pt reports occasional shaking in L hand with self-feeding, however states it is not like a tremor.  Pt completed PPT #2 self-feeding in 9.78 sec this session with no shaking or tremors.  Attempted to problem solve increased ease with eating salad, as pt continues to report difficulty with stabbing lettuce. LB dressing: pt reports she is able to cross her R leg over her opposite knee with increased ease, allowing her to don and fasten her socks and shoes with increased ease and independence.  Pt reports that she does have a shoe horn and will use it occasionally.  Community resources: provided pt with handouts for community PD programs as well as printed class and aquatic schedule at [employer], as pt reports that she is a member there.  Encouraged pt to make a loose daily schedule to increase consistent  engagement in exercise programs/classes.    10/03/22 Medication management: Pt brought in pill box and chart that she uses. Pt demonstrating good organization and checks/balances to insure medication compliance.  Pt reports incorporating towel on table to decrease risk of spilling pills when distributing meds.   Coordination: small peg board pattern replication with use of LUE.  Pt demonstrating good motor control when placing pegs one at a time into peg board.  OT increased challenge to completing with in-hand manipulation and translation with five pegs in hand and translating to finger tips. Pt able to complete with min difficulty, but no dropping of pegs. ADL: discussed strategies to attempt to increase success with jewelry clasps.  Pt continues to report difficulty with very small necklace clasps and some styles of earrings.   09/29/22 Pill box assessment: completed in 5:47.38  Pt making errors by placing too many pills in the AM box by placing two tablets in AM vs "one tablet twice a day".  Pt reports that she has a chart that reflects what should be in her pill box to allow for her to double check herself.  OT reiterated use of memory strategies when filling pill box and taking meds.  Pt utilizing visual chart as well as counting to aid in reducing errors.  Discussed placement of pill bottles to signify completion and/or need for refill (upside down).  Reiterated use of towel under work space to decrease risk of losing pills if dropped.   PATIENT EDUCATION: Education details: ongoing condition specific education, see above  Person educated: Patient Education method: Consulting civil engineer, Demonstration, Verbal cues, and Handouts Education comprehension: verbalized understanding and returned demonstration  HOME EXERCISE PROGRAM: Large amplitude UE HEP (see pt instructions)   GOALS: Goals reviewed with patient? Yes  SHORT TERM GOALS: Target date: 09/22/22  Pt will be Independent with PD specific  HEP. Baseline: Goal status: MET - 10/03/22  2.  Pt will verbalize understanding of adaptive techniques, positioning, and body mechanics to increase independence, ease, and sequencing with ADLs and IADLs. Baseline:  Goal status: MET - 09/26/22    3.  Pt will demonstrate increased ease with dressing as evidenced by decreasing PPT#4(don/ doff jacket) to 12 secs or less Baseline: 16.25 sec Goal status: IN PROGRESS  4.  Pt will demonstrate improved sustained grasp on items to engage in  IADLs (emptying dishwasher, setting table, etc.)  Baseline:  Goal status: MET - 10/03/22   LONG TERM GOALS: Target date: 10/20/22  Pt will verbalize or demonstrate improvements in speed and efficiency with donning socks and shoes. Baseline:  Goal status: IN PROGRESS  2.  Pt will demonstrate improved ease with feeding as evidenced by decreasing PPT#2 (self feeding) by 3 secs Baseline: 13.57 sec Goal status: MET - 9.78 sec  3.  Pt will demonstrate improved fine motor coordination for ADLs as evidenced by decreasing 9 hole peg test score for RUE by 3 secs Baseline: Right: 22.10 sec; Left: 26.90 sec Goal status: IN PROGRESS  4.  Pt will demonstrate improved UE functional use for ADLs as evidenced by increasing box/ blocks score by 4 blocks with RUE (hitting barrier </+ to 1 time) Baseline: Right 56 blocks (hitting barrier x4), Left 60 blocks Goal status: IN PROGRESS  5.  Pt will demonstrate ability to engage in dual task IADL activity with good alternating attention to complete simple functional task (simple snack prep, laundry task, etc) without error. Baseline:  Goal status: IN PROGRESS   ASSESSMENT:  CLINICAL IMPRESSION: Pt demonstrating difficulty with large amplitude thumb movement, with sliding cards off stack on LUE.  Pt reports intermittent "shakiness" but not tremors impacting self-feeding.  However pt is demonstrating improved speed and coordination with PPT #2 (self-feeding) due to  modifications to hand placement.  Pt expressing desire to resume more activity as she notices decreased endurance and is sore today after resuming walking for exercise yesterday.  Pt very appreciative of education and resources provided.  PERFORMANCE DEFICITS: in functional skills including ADLs, IADLs, coordination, dexterity, strength, Fine motor control, Gross motor control, mobility, balance, body mechanics, endurance, decreased knowledge of precautions, decreased knowledge of use of DME, and UE functional use, cognitive skills including attention and sequencing, and psychosocial skills including environmental adaptation, habits, and routines and behaviors.   IMPAIRMENTS: are limiting patient from ADLs and IADLs.   CO-MORBIDITIES: may have co-morbidities  that affects occupational performance. Patient will benefit from skilled OT to address above impairments and improve overall function.  MODIFICATION OR ASSISTANCE TO COMPLETE EVALUATION: No modification of tasks or assist necessary to complete an evaluation.  OT OCCUPATIONAL PROFILE AND HISTORY: Problem focused assessment: Including review of records relating to presenting problem.  CLINICAL DECISION MAKING: LOW - limited treatment options, no task modification necessary  REHAB POTENTIAL: Good  EVALUATION COMPLEXITY: Low    PLAN:  OT FREQUENCY: 1-2x/week  OT DURATION: 8 weeks  PLANNED INTERVENTIONS: self care/ADL training, therapeutic exercise, therapeutic activity, neuromuscular re-education, balance training, functional mobility training, ultrasound, compression bandaging, moist heat, cryotherapy, patient/family education, cognitive remediation/compensation, psychosocial skills training, energy conservation, coping strategies training, and DME and/or AE instructions  RECOMMENDED OTHER SERVICES: NA  CONSULTED AND AGREED WITH PLAN OF CARE: Patient  PLAN FOR NEXT SESSION: Review large amplitude movements - complete reaching in  standing and overhead/incorporate PNF reaching, engage in dual tasking for cognitive challenge, [place] tasks for ROM and strengthening.     Taysean Wager, [place], OTR/L 10/10/2022, 10:04 AM

## 2022-10-10 NOTE — Therapy (Signed)
OUTPATIENT SPEECH LANGUAGE PATHOLOGY PARKINSON'S TREATMENT/Progress note   Patient Name: Kristin [name] MRN: [id number] DOB:07/21/1951, 72 y.o., female Today's Date: 10/10/2022  PCP: Marin Olp MD REFERRING PROVIDER: Alonza Bogus, DO  END OF SESSION:  End of Session - 10/10/22 1025     Visit Number 9    Number of Visits 17    Date for SLP Re-Evaluation 10/31/22    SLP Start Time 52    SLP Stop Time  1100    SLP Time Calculation (min) 42 min    Activity Tolerance Patient tolerated treatment well                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   Hyperlipidemia    Hypertension    Infertility, female    Low back pain 10/17/2007   Qualifier: Diagnosis of  By: [name], [name]    Measles as a child   Obesity    Osteoarthritis    Parkinson disease    Plantar fasciitis of left foot 06/19/2012   Preventative health care 12/19/2015   Rheumatoid arthritis ([place]) 01/08/2018   Rosacea 10/05/2014   Swallowing difficulty    Urinary frequency 02/25/2013   Visual floaters 05/11/2014   Past Surgical History:  Procedure Laterality Date   ABDOMINAL HYSTERECTOMY  2006   total   esophageal     stretching   HERNIA REPAIR N/A 2022   hiatal hernia   laporoscopy     LEFT HEART CATH AND CORONARY ANGIOGRAPHY N/A 06/02/2019   Procedure: LEFT HEART CATH AND CORONARY ANGIOGRAPHY;  Surgeon: Jettie Booze,  MD;  Location: [facility];  Service: Cardiovascular;  Laterality: N/A;   TONSILLECTOMY     TOTAL HIP ARTHROPLASTY Right 2010   UPPER GASTROINTESTINAL ENDOSCOPY     wisdom teeth extracted     Patient Active Problem List   Diagnosis Date Noted   Vitamin B 12 deficiency 05/30/2021   Anemia 03/16/2020   Coronary artery disease involving native coronary artery of native heart without angina pectoris 93/73/4287   Diastolic dysfunction 68/07/5725   Rheumatoid arthritis ([place]) 01/08/2018   Hand pain, right 10/15/2017   Vitamin D deficiency 12/20/2016   Prediabetes 12/20/2016   Shortness of breath on exertion 11/27/2016   Morbid obesity ([place])    Constipation 12/07/2015   BCC (basal cell carcinoma of skin) 10/05/2014   Rosacea 10/05/2014   Parkinson's disease ([place]) 05/11/2014   Screening for cervical cancer 07/17/2012   Foot pain, bilateral 06/19/2012   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   Objective: Pt has made progress with improving loudness in conversation. See below.  Goal Update: See below.  Plan: See below  Reason Skilled Services are Required: Pt has not mastered loudness reshaping/recalibration in all situations outside ST.    ONSET DATE: Script dated 08-03-22  REFERRING DIAG: Parkinson's Disease  THERAPY DIAG:  Dysarthria and anarthria  Oropharyngeal dysphagia  Rationale for Evaluation and Treatment: Rehabilitation  SUBJECTIVE:   SUBJECTIVE STATEMENT: "I'm going out  to lunch so we will see how well I'm able to be heard." Pt accompanied by: self  PERTINENT HISTORY: See above for PMH. Pt with RA, on methotrexate.   PAIN:  Are you having pain? No  FALLS: Has patient fallen in last 6 months?  No  LIVING ENVIRONMENT: Lives with: lives with their family and lives with their spouse Lives in: House/apartment  PLOF:  Level of assistance: Independent with ADLs Employment: Retired  PATIENT GOALS: Improve communication, and swallowing if necessary  OBJECTIVE:  Register (09/27/22) Pt demonntrates normal oral and pharyngeal function. There is no evidence of the type of timing impairment or weakness that is sometimes associated with Parkinson's disease and dysphagia. Pt does have prominent cricopharyngeus muscle. Though nothing became lodged there during this study, pts report would be consistent with meats/pills hesitating at the UES and maybe even decreased UES relaxation with fluids. SLP showed the pt on imaging the area in question and described compensatory strategies for clearing residue there. If the pt were to experience a worsening of symptoms an ENT or GI specialist could assess further. SLP could f/u for education but otherwise dysphagia therapy is not currently needed.   PATIENT REPORTED OUTCOME MEASURES (PROM): Communication Effectiveness Survey: pt scored 16/32 (higher scores indicate better QOL) with communicating across a room and over the telephone being "not at all effective".  EAT-10: 13/40 (lower scores indicate better QOL.Pt rated 3/4 (Notable problem) with swallowing solids takes extra effort, swallowing pills takes extra effort, and swallowing is stressful.  TODAY'S TREATMENT:                                                                                                                                         DATE:  10/10/22: Pt enters with conversation produced with low 70s dB over 20 minutes of simple-mod complex  conversation. Loud /a/ began with a more harsh glottal onset, so SLP attempted to shape her /a/ with softer glottal onset however this facilitated incr'd laryngeal strain. SLP had pt use "hey - ah" to reduce/eliminate laryngeal strain but unsuccessful x4 attempts. SLP had pt cont with harder glottal onset, average low 90s dB. Everyday sentences produced with average low 80s-mid 80s dB. SLP and pt went outdoors and pt maintained  louder speech for 7 minutes. Decr to once/week in next 1-2 visits.  10/03/22: Enters with loudness WNL volume. Loud /a/ today performed with average in low 90s. Pt brought everyday sentences and these were copied and left in ST room. Recited today with low-mid 80s dB. Pt ate lunch at a noisy restaurant for 3 hours and engaged in conversation back and forth for the entire time without decr in vocal stamina. Today she and SLP engaged in conversation for 37 minutes and pt maintained average loudness in low 70s. She told SLP there was not a decr in vocal stamina. Pt has worn bracelet as external cue since last session.  09/29/22: Speech: Pt was at lunch for 2 1/2 hours yesterday and nobody asked her to repeat - pt pleased with this and SLP affirmed this. Loud /a/ today performed with average in low 90s dB, with initial cue to raise pitch for her /a/ (too low initially). Everyday sentences were not brought today but pt made note to bring these next session. Pt has been consistent with HEP daily.  Conversation for 20 minutes with pt maintaining volume of low 70s dB. Pt did not have an external cue, but stated she would do this when returning home. Swallow: SLP discussed results of her MBS with her and explained/educated about CP bar- her appointment with her GI is next week.   09/26/22: MBS tomorrow. Pt reports thinking about or using louder, intentional speech approx 75% of the time outside of ST. She has noticed people asking her less frequently for repeats. Pt produced loud /a/ with self  corrected pressed/strained voice (stopped and corrected her production) with average in low 90s dB. Everyday sentences produced with low-mid 80s dB. Sentence responses with avergae low 70s dB and self-correcting AB, and correct ID of vocal fry. She and SLP engaged in conversational segments of approx 10 mintues with her maintaining loudness average of upper 60s-low 70s dB with rare min A for AB and maintaining loudness. Educated pt about benefit of external cues; Pt thinking of switching wrists for her watch.  09/13/22: Pt reports being better with homework since last session. Loud "hey" with low 90s dB with less pressed/tight voice than previous session and pt Id'd this (tight voice) correctly when it occurred. SLP trained pt for loud /a/ by using a strong diaphragm and easy gentle thyroid/neck, and assisted pt with her pitch for /a/ which started too low. Her everyday sentences were completed today with more consistency in the entire sentence maintaining WNL loudness. She  provided bill and coin combinations with WNL loudness and answered simple questions with occasional min A to decr frequency of loudness decay. She was provided with HEP for loud /a/ 5x, everyday sentences, and 10 hard swallows. She was also provided with handouts and the rationale ("develop muscle memory for louder speech") for practicing them as well.   09/11/22: Pt has not practiced to recommended frequency. [place] said she will do better with this for next session. Loud "hey" with low 90s dB with some pressed/tight/squeezed voice - pt correctly Id'd this with min A from SLP but then Id'd independently after this. SLP used "hey" and "ah" together to attempt to foster relaxed /a/ but pt unable to completely perform in order to perform at home at this time. Everyday sentences produced mid 80s dB with initial cues necessary for breath and when to initiate voicing. [place] had 80% success with WNL volume when reading a phrase and then providing an  answer. Homework  was provided for this until next session. SLP stressed BID completion of HEP and of homework.  09/06/22: Bedside/clinical swallow eval. See initial eval for oral-motor assessment. Pt reports she does not really eat meat as much due to a little anxiety about it choking her. She endorsed taking smaller bites and sips in the last 2-3 months, compared to prior to this. Pt told SLP she is taking more care and caution due to fear of choking, with meat, and has almost ceased eating it. SLP had pt add 10 effortful swallows BID to begin to work on swallow strength. If not necessary after MBS, will d/c this exercise. CLINICAL SWALLOW ASSESSMENT:   Current diet: regular, thin liquids, and current diet modifications: pt does not eat entrees of meat anymore due to fear of choking. Will have meat in a dish, but not entrees. Dentition: adequate natural dentition Patient directly observed with POs: Yes: regular, dysphagia 3 (soft), dysphagia 1 (puree), and thin liquids  Feeding: able to feed self Liquids provided by: cup Oral phase signs and symptoms: prolonged mastication and prolonged bolus formation Pharyngeal phase signs and symptoms: none noted  09/01/22: Pt arrived with WNL volume in simple to mod=complex conversation - for approx 7 minutes and then conversation reverted to sub-WNL until SLP told pt and then she incr'd volume to WNL for another 5 minutes. SLP assisted pt with proper vocal production with "hey" stressing abdominal recruitment. Pt was approx 95-90% successful and was able to ID strain from vocal folds instead of abdominal use. 10 loud "hey" BID was prescribed. Pt also to arrive with 10 everyday sentences next session. SLP highlighted pt's habitual throat clearing and encouraged a small sip of water with a strong swallow. Pt self-cued after a throat clear multiple (x3-4) during the remainder of the session.   Pt was explained rationale for CES and asked to return it next  session. Bedside/clinical swallow evaluation next session.  08/22/22: SLP discussed results of evaluation, sharing that pt's "hoarseness" is very likely due to reduced breath support and that after pt engaged in loud "hey!" Her vocal quality had vastly improved. SLP attributed this to pt's re-familiarization with abdominal engagement when speaking.   PATIENT EDUCATION: Education details: See above in "today's treatment" Person educated: Patient Education method: Explanation, Demonstration, and Verbal cues Education comprehension: verbalized understanding, returned demonstration, verbal cues required, and needs further education  HOME EXERCISE PROGRAM: "Haaaaaaaaaah" x5, everyday sentences, and 10 effortful swallows   GOALS: Goals reviewed with patient? Yes  SHORT TERM GOALS: Target date: 09/29/22  Pt will undergo bedside swallow evaluation and subsequent MBS if clinically indicated Baseline: Goal status: Met  2.  Pt will demo loud sustained /a/ or "Hey - ahhhhhh" with low 90s dB x4 sessions  Baseline: 09-11-22, 09-13-22, 09/26/22 Goal status: Met  3.  Pt will correctly acknowledge using abdominal musculature/reduced vocal fry in sentence response tasks x3 sessions Baseline: 09/13/22, 09-26-22 Goal status: Met  4.  Pt will demo low 70s dB average in sentence responses in 2 sessions Baseline: 09/26/22 Goal status: Met   LONG TERM GOALS: Target date: 10/31/22  Pt will report higher/better PROM scores than initial session Baseline:  Goal status: Ongoing  2.  Pt will report WNL vocal stamina in conversations >35 minutes between 3 sessions Baseline: 10/03/22 Goal status: Ongoing  3.  Pt will demo WNL volume (low 70s dB average) in 30 minutes conversation with modified independence (external cues PRN) x4 sessions Baseline: 10/03/22, 10/10/22 Goal status: Ongoing  4.  Pt will demo WNL volume (low 70s dB average) in 25 minutes conversation out of ST room with modified independence  (external cues PRN) x3 sessions Baseline:  Goal status: Initial  ASSESSMENT:  CLINICAL IMPRESSION: Patient is a 72 y.o. female who is being seen for treatment of speech (dysarthria) in light of her Parkinson's Disease. Pt continues making excellent progress with speech volume in and outside of ST room during ST sessions. MBSS was WNL except for f/u suggested with GI due to prominent CP bar - Gastroparesis was diagnosed and pt to have 4 hour GI exam pending. Decr to once/week in next 1-2 sessions  OBJECTIVE IMPAIRMENTS: Objective impairments include dysarthria and dysphagia. These impairments are limiting patient from household responsibilities, ADLs/IADLs, effectively communicating at home and in community, and safety when swallowing.Factors affecting potential to achieve goals and functional outcome are  none .Marland Kitchen Patient will benefit from skilled SLP services to address above impairments and improve overall function.  REHAB POTENTIAL: Good  PLAN:  SLP FREQUENCY: 2x/week  SLP DURATION: 8 weeks or 17 visits  PLANNED INTERVENTIONS: Aspiration precaution training, Pharyngeal strengthening exercises, Diet toleration management , Environmental controls, Trials of upgraded texture/liquids, Cueing hierachy, Internal/external aids, SLP instruction and feedback, Compensatory strategies, and Patient/family education    [facility], [place] 10/10/2022, 10:26 AM

## 2022-10-11 ENCOUNTER — Telehealth: Payer: Self-pay | Admitting: Gastroenterology

## 2022-10-11 NOTE — Telephone Encounter (Signed)
Inbound call from patient , she would like a nurse to give her a call she have some concerns regarding her recent procedure . Please advise

## 2022-10-12 ENCOUNTER — Ambulatory Visit: Payer: Medicare Other

## 2022-10-12 ENCOUNTER — Other Ambulatory Visit: Payer: Self-pay

## 2022-10-12 ENCOUNTER — Ambulatory Visit: Payer: Medicare Other | Admitting: Occupational Therapy

## 2022-10-12 DIAGNOSIS — R1312 Dysphagia, oropharyngeal phase: Secondary | ICD-10-CM | POA: Diagnosis not present

## 2022-10-12 DIAGNOSIS — M6281 Muscle weakness (generalized): Secondary | ICD-10-CM

## 2022-10-12 DIAGNOSIS — R278 Other lack of coordination: Secondary | ICD-10-CM

## 2022-10-12 DIAGNOSIS — R471 Dysarthria and anarthria: Secondary | ICD-10-CM

## 2022-10-12 DIAGNOSIS — R1013 Epigastric pain: Secondary | ICD-10-CM

## 2022-10-12 DIAGNOSIS — R198 Other specified symptoms and signs involving the digestive system and abdomen: Secondary | ICD-10-CM

## 2022-10-12 DIAGNOSIS — R29818 Other symptoms and signs involving the nervous system: Secondary | ICD-10-CM | POA: Diagnosis not present

## 2022-10-12 NOTE — Telephone Encounter (Signed)
Spoke with the patient. Study order placed. Patient given the scheduling phone number. She is having fewer symptoms on the gastroparesis diet. She will continue this diet for now. Her follow up appointment is in April.

## 2022-10-12 NOTE — Telephone Encounter (Signed)
Inbound call from patient requesting a call back to discuss a order that Is on her paperwork from her EGD procedure she had with Dr. Silverio Decamp on 2/2. Patient stated that it says "will obtain gastric emptying scan". Please advise.

## 2022-10-12 NOTE — Therapy (Signed)
OUTPATIENT OCCUPATIONAL THERAPY  Treatment Note  Patient Name: Kristin [name] MRN: [id number] DOB:October 16, 1950, 72 y.o., female Today's Date: 09/13/2022  PCP: Marin Olp, MD REFERRING PROVIDER: Ludwig Clarks, DO  END OF SESSION:  OT End of Session - 10/12/22 1021     Visit Number 7    Number of Visits 13    Date for OT Re-Evaluation 10/20/22    Authorization Type Medicare A&B    OT Start Time 1018    OT Stop Time 1100    OT Time Calculation (min) 42 min                   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   Hyperlipidemia    Hypertension    Infertility, female    Low back pain 10/17/2007   Qualifier: Diagnosis of  By: [name], [name]    Measles as a child   Obesity    Osteoarthritis    Parkinson disease    Plantar fasciitis of left foot 06/19/2012   Preventative health care 12/19/2015   Rheumatoid arthritis ([place]) 01/08/2018   Rosacea 10/05/2014   Swallowing difficulty    Urinary frequency 02/25/2013   Visual floaters 05/11/2014   Past Surgical History:  Procedure Laterality Date   ABDOMINAL HYSTERECTOMY  2006   total   esophageal     stretching   HERNIA REPAIR N/A 2022   hiatal hernia   laporoscopy     LEFT HEART CATH AND CORONARY ANGIOGRAPHY N/A 06/02/2019   Procedure: LEFT HEART CATH AND CORONARY ANGIOGRAPHY;  Surgeon: Jettie Booze, MD;  Location: [facility];   Service: Cardiovascular;  Laterality: N/A;   TONSILLECTOMY     TOTAL HIP ARTHROPLASTY Right 2010   UPPER GASTROINTESTINAL ENDOSCOPY     wisdom teeth extracted     Patient Active Problem List   Diagnosis Date Noted   Vitamin B 12 deficiency 05/30/2021   Anemia 03/16/2020   Coronary artery disease involving native coronary artery of native heart without angina pectoris 78/93/8101   Diastolic dysfunction 75/06/2584   Rheumatoid arthritis ([place]) 01/08/2018   Hand pain, right 10/15/2017   Vitamin D deficiency 12/20/2016   Prediabetes 12/20/2016   Shortness of breath on exertion 11/27/2016   Morbid obesity ([place])    Constipation 12/07/2015   BCC (basal cell carcinoma of skin) 10/05/2014   Rosacea 10/05/2014   Parkinson's disease ([place]) 05/11/2014   Screening for cervical cancer 07/17/2012   Foot pain, bilateral 06/19/2012   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   Depression with anxiety 11/02/2009   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   Lumbar back pain with radiculopathy affecting lower extremity 10/17/2007   Essential hypertension 07/05/2007   Osteoarthritis 07/05/2007   Hyperlipidemia 06/26/2007   H/O: iron deficiency anemia 05/08/2007   Hiatal hernia with GERD  05/08/2007    ONSET DATE: referral date 08/03/22  REFERRING DIAG: G20.B2 (ICD-10-CM) - Parkinson's disease with dyskinesia and fluctuating manifestations  THERAPY DIAG:  Other symptoms and signs involving the nervous system  Muscle weakness (generalized)  Other lack of coordination  Rationale for Evaluation and Treatment: Rehabilitation  SUBJECTIVE:   SUBJECTIVE STATEMENT: Pt reports that she loved the calendar provided during previous session. Pt accompanied by: self  PERTINENT HISTORY: PD, rheumatoid arthritis, depression and anxiety  PRECAUTIONS: Fall  WEIGHT BEARING RESTRICTIONS: No  PAIN:  Are you having pain? No  FALLS: Has patient fallen in last 6 months? No  LIVING ENVIRONMENT: Lives  with: lives with their family and lives with their spouse (and adult son lives with them, helps with going to the grocery store, putting away groceries, and odd jobs around the house) Lives in: House/apartment Stairs: Yes: Internal: full flight of steps, but lives on main floor with no need to use steps;  and External: 1 steps; none Has following equipment at home: shower chair, Grab bars, and hand held shower head, and walking stick  PLOF: Independent and Independent with basic ADLs, requires increased time for ADLs  PATIENT GOALS: to get a little more efficient with movements and every day tasks  OBJECTIVE:   HAND DOMINANCE: Left  ADLs: Overall ADLs: Mod I, however reports that it takes quite a bit longer to put on shoes, socks, and jacket Transfers/ambulation related to ADLs: utilizes walking stick with all mobility in home and in community Eating: reports that she has changed what she eats, especially when out at restaurant, with no longer eating soups or salad due to increased time to manage LB Dressing: increased time to put on shoes and socks Equipment: Shower seat with back, Grab bars, Walk in shower, and hand held shower head  IADLs: Shopping: son does the shopping Light housekeeping: laundry, empties the dishwasher Meal Prep: completes all the cooking, reports it does not go "as smoothly" as it has; easily distracted if phone rings or gets distracted by anything Community mobility: still driving Medication management: Mod I, uses weekly pill box Handwriting: 90% legible and Increased time  MOBILITY STATUS: Needs Assist: utilizing walking stick with all mobility  POSTURE COMMENTS:  rounded shoulders  ACTIVITY TOLERANCE: Activity tolerance: WFL, reports taking longer for all ADLs and IADLs  FUNCTIONAL OUTCOME MEASURES: Physical performance test: #1 (handwriting):13.28 sec*     #2 (self-feeding): 13.57 sec     #4 (jacket on/off): 16.25 sec  UPPER EXTREMITY ROM:  WFL  bilaterally   UPPER EXTREMITY MMT:   WFL bilaterally   COORDINATION: 9 Hole Peg test: Right: 22.10 sec; Left: 26.90 sec Box and Blocks:  Right 56 blocks (hitting barrier x4), Left 60 blocks Tremors: Resting, LLE  SENSATION: WFL  COGNITION: Overall cognitive status: Within functional limits for tasks assessed  VISION: Subjective report: notes slight change over time Baseline vision: Wears glasses all the time Visual history:  sees a retina specialist, dr does note beginnings of cataracts  VISION ASSESSMENT: Not tested  OBSERVATIONS: mild resting tremor LLE during evaluation   TODAY'S TREATMENT:  10/12/22 9 hole peg test:  R: 25.75 and L: 23.03.  Discussed improved functional use of RUE, however due to RUE non-dominant UE decided to d/c goal. Box and Blocks: RUE: 53 (hit barrier x2), LUE: 59.  Reviewed functional carryover with [place] as pt reports occasionally hitting edge of counter top or table and breaking glasses due to decreased [place]. Self-care: Reviewed goals and progress towards goals as well as education on therapy schedule/recommendation for therapy f/u with PD.  Engaged in discussion about initiating therapy with mental health professional as pt reporting increased awareness of additional issues impacting her engagement in PD exercise classes and support group.   10/10/22 Large amplitude: engaged in card flipping with focus on large amplitude forearm supination and hand opening.  OT providing min cues for increased amplitude. Sliding cards off deck with thumb with focus on large amplitude push from thumb.  Completed bilaterally, noted decreased motor control and amplitude in dominant L hand.   Self-feeding: Pt reports occasional shaking in L hand with self-feeding, however states it is not like a tremor.  Pt completed PPT #2 self-feeding in 9.78 sec this session with no shaking or tremors.  Attempted to  problem solve increased ease with eating salad, as pt continues to report difficulty with stabbing lettuce. LB dressing: pt reports she is able to cross her R leg over her opposite knee with increased ease, allowing her to don and fasten her socks and shoes with increased ease and independence.  Pt reports that she does have a shoe horn and will use it occasionally.  Community resources: provided pt with handouts for community PD programs as well as printed class and aquatic schedule at [employer], as pt reports that she is a member there.  Encouraged pt to make a loose daily schedule to increase consistent engagement in exercise programs/classes.    10/03/22 Medication management: Pt brought in pill box and chart that she uses. Pt demonstrating good organization and checks/balances to insure medication compliance.  Pt reports incorporating towel on table to decrease risk of spilling pills when distributing meds.   Coordination: small peg board pattern replication with use of LUE.  Pt demonstrating good motor control when placing pegs one at a time into peg board.  OT increased challenge to completing with in-hand manipulation and translation with five pegs in hand and translating to finger tips. Pt able to complete with min difficulty, but no dropping of pegs. ADL: discussed strategies to attempt to increase success with jewelry clasps.  Pt continues to report difficulty with very small necklace clasps and some styles of earrings.   PATIENT EDUCATION: Education details: ongoing condition specific education, see above  Person educated: Patient Education method: Consulting civil engineer, Demonstration, Verbal cues, and Handouts Education comprehension: verbalized understanding and returned demonstration  HOME EXERCISE PROGRAM: Large amplitude UE HEP (see pt instructions)   GOALS: Goals reviewed with patient? Yes  SHORT TERM GOALS: Target date: 09/22/22  Pt will be Independent with PD specific  HEP. Baseline: Goal status: MET - 10/03/22  2.  Pt will verbalize understanding of adaptive techniques, positioning, and body mechanics to increase independence, ease, and sequencing with ADLs and IADLs. Baseline:  Goal status: MET - 09/26/22    3.  Pt will demonstrate increased ease with dressing as evidenced by decreasing PPT#4(don/ doff jacket) to 12 secs or less Baseline: 16.25 sec Goal status: IN PROGRESS  4.  Pt will demonstrate improved sustained grasp on items to engage in IADLs (emptying dishwasher, setting table, etc.)  Baseline:  Goal status: MET - 10/03/22   LONG TERM GOALS: Target date: 10/20/22  Pt will verbalize or demonstrate improvements in speed and efficiency with donning socks and shoes. Baseline:  Goal status: MET - 10/12/22  2.  Pt will demonstrate improved ease with feeding as evidenced by decreasing PPT#2 (self feeding) by 3 secs Baseline: 13.57 sec Goal status: MET - 9.78 sec - 10/10/22  3.  Pt will demonstrate improved fine motor coordination for ADLs as evidenced by decreasing 9 hole peg test score for RUE by 3 secs Baseline: Right: 22.10 sec; Left: 26.90 sec Goal status: REVISED - D/C goal as pt with PD impairments on RUE which is non-dominant and completing 9 hole peg test with R: 25.75 and L: 23.03 on 10/12/22  4.  Pt will demonstrate improved UE functional use for ADLs as evidenced by increasing box/ blocks score by 4 blocks with RUE (hitting barrier </+ to 1 time) Baseline: Right 56 blocks (hitting barrier x4), Left 60 blocks Goal status: IN PROGRESS  5.  Pt will demonstrate ability to engage in dual task IADL activity with good alternating attention to complete simple functional task (simple snack prep, laundry task, etc) without error. Baseline:  Goal status: IN PROGRESS   ASSESSMENT:  CLINICAL IMPRESSION: Pt voicing concerns with mental health issues s/p visit with PCP who asked questions that increased her awareness about certain things.  Discussed  importance of routine visits with PCP, OT/PT/SLP for PD, and possible benefits of seeing mental health professional as pt expressing awareness of certain tendencies (with overstepping in regards to reaching out to others about PD resources and feeling that she considers others' feelings over her own in making decisions).  Pt reports improvements in ability to complete LB dressing with donning socks/shoes and increased engagement with functional ADLs/IADLs without relying on assist from spouse as often.  Pt continues to demonstrate mild decreased motor control in RUE as noted during Box and Blocks assessment.  Plan to see pt for one more session to address [facility] and provide with additional coordination exercises to continue to address on own time.  PERFORMANCE DEFICITS: in functional skills including ADLs, IADLs, coordination, dexterity, strength, Fine motor control, Gross motor control, mobility, balance, body mechanics, endurance, decreased knowledge of precautions, decreased knowledge of use of DME, and UE functional use, cognitive skills including attention and sequencing, and psychosocial skills including environmental adaptation, habits, and routines and behaviors.   IMPAIRMENTS: are limiting patient from ADLs and IADLs.   CO-MORBIDITIES: may have co-morbidities  that affects occupational performance. Patient will benefit from skilled OT to address above impairments and improve overall function.  MODIFICATION OR ASSISTANCE TO COMPLETE EVALUATION: No modification of tasks or assist necessary to complete an evaluation.  OT OCCUPATIONAL PROFILE AND HISTORY: Problem focused assessment: Including review of records relating to presenting problem.  CLINICAL DECISION MAKING: LOW - limited treatment options, no task modification necessary  REHAB POTENTIAL: Good  EVALUATION COMPLEXITY: Low    PLAN:  OT FREQUENCY: 1-2x/week  OT DURATION: 8 weeks  PLANNED INTERVENTIONS: self care/ADL training,  therapeutic exercise, therapeutic activity, neuromuscular re-education, balance training, functional mobility training, ultrasound, compression bandaging, moist heat, cryotherapy, patient/family education, cognitive remediation/compensation, psychosocial skills training, energy conservation, coping strategies training, and DME and/or AE instructions  RECOMMENDED OTHER SERVICES: NA  CONSULTED AND AGREED WITH PLAN OF CARE: Patient  PLAN FOR NEXT SESSION:  reaching in standing and overhead/incorporate PNF reaching, engage in dual tasking for cognitive challenge, [place] tasks for ROM  with ball toss and functional reaching with [place], [place], OTR/L 10/12/2022, 3:59 PM

## 2022-10-13 ENCOUNTER — Encounter: Payer: Medicare Other | Admitting: Occupational Therapy

## 2022-10-13 NOTE — Therapy (Signed)
OUTPATIENT SPEECH LANGUAGE PATHOLOGY PARKINSON'S TREATMENT   Patient Name: Kristin [name] MRN: [id number] DOB:April 17, 1951, 72 y.o., female Today's Date: 10/13/2022  PCP: Marin Olp MD REFERRING PROVIDER: Alonza Bogus, DO  END OF SESSION:  End of Session - 10/13/22 1900     Visit Number 10    Number of Visits 17    Date for SLP Re-Evaluation 10/31/22    SLP Start Time 1106    SLP Stop Time  1146    SLP Time Calculation (min) 40 min    Activity Tolerance Patient tolerated treatment well                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   Hyperlipidemia    Hypertension    Infertility, female    Low back pain 10/17/2007   Qualifier: Diagnosis of  By: [name], [name]    Measles as a child   Obesity    Osteoarthritis    Parkinson disease    Plantar fasciitis of left foot 06/19/2012   Preventative health care 12/19/2015   Rheumatoid arthritis ([place]) 01/08/2018   Rosacea 10/05/2014   Swallowing difficulty    Urinary frequency 02/25/2013   Visual floaters 05/11/2014   Past Surgical History:  Procedure Laterality Date   ABDOMINAL HYSTERECTOMY  2006   total   esophageal     stretching   HERNIA REPAIR N/A 2022   hiatal hernia   laporoscopy     LEFT HEART CATH AND CORONARY ANGIOGRAPHY N/A 06/02/2019   Procedure: LEFT HEART CATH AND CORONARY ANGIOGRAPHY;  Surgeon: Jettie Booze, MD;  Location:  [facility];  Service: Cardiovascular;  Laterality: N/A;   TONSILLECTOMY     TOTAL HIP ARTHROPLASTY Right 2010   UPPER GASTROINTESTINAL ENDOSCOPY     wisdom teeth extracted     Patient Active Problem List   Diagnosis Date Noted   Vitamin B 12 deficiency 05/30/2021   Anemia 03/16/2020   Coronary artery disease involving native coronary artery of native heart without angina pectoris [id number]   Diastolic dysfunction [id number]   Rheumatoid arthritis ([place]) 01/08/2018   Hand pain, right 10/15/2017   Vitamin D deficiency 12/20/2016   Prediabetes 12/20/2016   Shortness of breath on exertion 11/27/2016   Morbid obesity ([place])    Constipation 12/07/2015   BCC (basal cell carcinoma of skin) 10/05/2014   Rosacea 10/05/2014   Parkinson's disease ([place]) 05/11/2014   Screening for cervical cancer 07/17/2012   Foot pain, bilateral 06/19/2012   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   Depression with anxiety 11/02/2009   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   Lumbar back pain with radiculopathy affecting lower extremity 10/17/2007   Essential hypertension 07/05/2007   Osteoarthritis 07/05/2007   Hyperlipidemia 06/26/2007   H/O: iron deficiency anemia 05/08/2007   Hiatal hernia with GERD 05/08/2007  ONSET DATE: Script dated 08-03-22  REFERRING DIAG: Parkinson's Disease  THERAPY DIAG:  Dysarthria and anarthria  Oropharyngeal dysphagia  Rationale for Evaluation and Treatment: Rehabilitation  SUBJECTIVE:   SUBJECTIVE STATEMENT: Pt reports feeling pleased with her normally loud speech. Pt accompanied by: self  PERTINENT HISTORY: See above for PMH. Pt with RA, on methotrexate.   PAIN:  Are you having pain? No  FALLS: Has patient fallen in last 6 months?  No  LIVING ENVIRONMENT: Lives with: lives with their family and lives with their spouse Lives in: House/apartment  PLOF:  Level of assistance: Independent with ADLs Employment: Retired  PATIENT GOALS: Improve  communication, and swallowing if necessary  OBJECTIVE:  [name] (09/27/22) Pt demonntrates normal oral and pharyngeal function. There is no evidence of the type of timing impairment or weakness that is sometimes associated with Parkinson's disease and dysphagia. Pt does have prominent cricopharyngeus muscle. Though nothing became lodged there during this study, pts report would be consistent with meats/pills hesitating at the UES and maybe even decreased UES relaxation with fluids. SLP showed the pt on imaging the area in question and described compensatory strategies for clearing residue there. If the pt were to experience a worsening of symptoms an ENT or GI specialist could assess further. SLP could f/u for education but otherwise dysphagia therapy is not currently needed.   PATIENT REPORTED OUTCOME MEASURES (PROM): Communication Effectiveness Survey: pt scored 16/32 (higher scores indicate better QOL) with communicating across a room and over the telephone being "not at all effective".  EAT-10: 13/40 (lower scores indicate better QOL.Pt rated 3/4 (Notable problem) with swallowing solids takes extra effort, swallowing pills takes extra effort, and swallowing is stressful.  TODAY'S TREATMENT:                                                                                                                                         DATE:  10/12/22: Probable decr to once/week next week. Pt enters today with conversation produced with low 70s dB for 12 minutes. Loud /a/ produced with low 90s dB, and every day sentences with mid 80s dB. SLP assisted pt in shaping the sentences to sound less "conversational" and more focused on simply "being loud." Pt maintained louder/normal volume speech for 27 minutes mod complex conversation. Suspect d/c in ~4 visits.  10/10/22: Pt enters with conversation produced with low 70s dB over 20 minutes of simple-mod complex conversation. Loud /a/ began with a more  harsh glottal onset, so SLP attempted to shape her /a/ with softer glottal onset however this facilitated incr'd laryngeal strain. SLP had pt use "hey - ah" to reduce/eliminate laryngeal strain but unsuccessful x4 attempts. SLP had pt cont with harder glottal onset, average low 90s dB. Everyday sentences produced with average low 80s-mid 80s dB. SLP and pt went outdoors and pt maintained louder speech for 7 minutes. Decr to once/week in  next 1-2 visits.  10/03/22: Enters with loudness WNL volume. Loud /a/ today performed with average in low 90s. Pt brought everyday sentences and these were copied and left in ST room. Recited today with low-mid 80s dB. Pt ate lunch at a noisy restaurant for 3 hours and engaged in conversation back and forth for the entire time without decr in vocal stamina. Today she and SLP engaged in conversation for 37 minutes and pt maintained average loudness in low 70s. She told SLP there was not a decr in vocal stamina. Pt has worn bracelet as external cue since last session.  09/29/22: Speech: Pt was at lunch for 2 1/2 hours yesterday and nobody asked her to repeat - pt pleased with this and SLP affirmed this. Loud /a/ today performed with average in low 90s dB, with initial cue to raise pitch for her /a/ (too low initially). Everyday sentences were not brought today but pt made note to bring these next session. Pt has been consistent with HEP daily.  Conversation for 20 minutes with pt maintaining volume of low 70s dB. Pt did not have an external cue, but stated she would do this when returning home. Swallow: SLP discussed results of her MBS with her and explained/educated about CP bar- her appointment with her GI is next week.   09/26/22: MBS tomorrow. Pt reports thinking about or using louder, intentional speech approx 75% of the time outside of ST. She has noticed people asking her less frequently for repeats. Pt produced loud /a/ with self corrected pressed/strained voice (stopped  and corrected her production) with average in low 90s dB. Everyday sentences produced with low-mid 80s dB. Sentence responses with avergae low 70s dB and self-correcting AB, and correct ID of vocal fry. She and SLP engaged in conversational segments of approx 10 mintues with her maintaining loudness average of upper 60s-low 70s dB with rare min A for AB and maintaining loudness. Educated pt about benefit of external cues; Pt thinking of switching wrists for her watch.  09/13/22: Pt reports being better with homework since last session. Loud "hey" with low 90s dB with less pressed/tight voice than previous session and pt Id'd this (tight voice) correctly when it occurred. SLP trained pt for loud /a/ by using a strong diaphragm and easy gentle thyroid/neck, and assisted pt with her pitch for /a/ which started too low. Her everyday sentences were completed today with more consistency in the entire sentence maintaining WNL loudness. She  provided bill and coin combinations with WNL loudness and answered simple questions with occasional min A to decr frequency of loudness decay. She was provided with HEP for loud /a/ 5x, everyday sentences, and 10 hard swallows. She was also provided with handouts and the rationale ("develop muscle memory for louder speech") for practicing them as well.   09/11/22: Pt has not practiced to recommended frequency. Marlyn said she will do better with this for next session. Loud "hey" with low 90s dB with some pressed/tight/squeezed voice - pt correctly Id'd this with min A from SLP but then Id'd independently after this. SLP used "hey" and "ah" together to attempt to foster relaxed /a/ but pt unable to completely perform in order to perform at home at this time. Everyday sentences produced mid 80s dB with initial cues necessary for breath and when to initiate voicing. Tarisha had 80% success with WNL volume when reading a phrase and then providing an answer. Homework was provided for this until  next session. SLP stressed  BID completion of HEP and of homework.  09/06/22: Bedside/clinical swallow eval. See initial eval for oral-motor assessment. Pt reports she does not really eat meat as much due to a little anxiety about it choking her. She endorsed taking smaller bites and sips in the last 2-3 months, compared to prior to this. Pt told SLP she is taking more care and caution due to fear of choking, with meat, and has almost ceased eating it. SLP had pt add 10 effortful swallows BID to begin to work on swallow strength. If not necessary after MBS, will d/c this exercise. CLINICAL SWALLOW ASSESSMENT:   Current diet: regular, thin liquids, and current diet modifications: pt does not eat entrees of meat anymore due to fear of choking. Will have meat in a dish, but not entrees. Dentition: adequate natural dentition Patient directly observed with POs: Yes: regular, dysphagia 3 (soft), dysphagia 1 (puree), and thin liquids  Feeding: able to feed self Liquids provided by: cup Oral phase signs and symptoms: prolonged mastication and prolonged bolus formation Pharyngeal phase signs and symptoms: none noted  09/01/22: Pt arrived with WNL volume in simple to mod=complex conversation - for approx 7 minutes and then conversation reverted to sub-WNL until SLP told pt and then she incr'd volume to WNL for another 5 minutes. SLP assisted pt with proper vocal production with "hey" stressing abdominal recruitment. Pt was approx 95-90% successful and was able to ID strain from vocal folds instead of abdominal use. 10 loud "hey" BID was prescribed. Pt also to arrive with 10 everyday sentences next session. SLP highlighted pt's habitual throat clearing and encouraged a small sip of water with a strong swallow. Pt self-cued after a throat clear multiple (x3-4) during the remainder of the session.   Pt was explained rationale for CES and asked to return it next session. Bedside/clinical swallow evaluation next  session.  08/22/22: SLP discussed results of evaluation, sharing that pt's "hoarseness" is very likely due to reduced breath support and that after pt engaged in loud "hey!" Her vocal quality had vastly improved. SLP attributed this to pt's re-familiarization with abdominal engagement when speaking.   PATIENT EDUCATION: Education details: See above in "today's treatment" Person educated: Patient Education method: Explanation, Demonstration, and Verbal cues Education comprehension: verbalized understanding, returned demonstration, verbal cues required, and needs further education  HOME EXERCISE PROGRAM: "Haaaaaaaaaah" x5, everyday sentences, and 10 effortful swallows   GOALS: Goals reviewed with patient? Yes  SHORT TERM GOALS: Target date: 09/29/22  Pt will undergo bedside swallow evaluation and subsequent MBS if clinically indicated Baseline: Goal status: Met  2.  Pt will demo loud sustained /a/ or "Hey - ahhhhhh" with low 90s dB x4 sessions  Baseline: 09-11-22, 09-13-22, 09/26/22 Goal status: Met  3.  Pt will correctly acknowledge using abdominal musculature/reduced vocal fry in sentence response tasks x3 sessions Baseline: 09/13/22, 09-26-22 Goal status: Met  4.  Pt will demo low 70s dB average in sentence responses in 2 sessions Baseline: 09/26/22 Goal status: Met   LONG TERM GOALS: Target date: 10/31/22  Pt will report higher/better PROM scores than initial session Baseline:  Goal status: Ongoing  2.  Pt will report WNL vocal stamina in conversations >35 minutes between 3 sessions Baseline: 10/03/22 Goal status: Ongoing  3.  Pt will demo WNL volume (low 70s dB average) in 30 minutes conversation with modified independence (external cues PRN) x4 sessions Baseline: 10/03/22, 10/10/22 Goal status: Ongoing  4.  Pt will demo WNL volume (low 70s dB  average) in 25 minutes conversation out of ST room with modified independence (external cues PRN) x3 sessions Baseline:  Goal status:  Ongoing  ASSESSMENT:  CLINICAL IMPRESSION: Patient is a 72 y.o. female who is being seen for treatment of speech (dysarthria) in light of her Parkinson's Disease. Pt continues making excellent progress with speech volume in and outside of ST room during ST sessions. MBSS was WNL except for f/u suggested with GI due to prominent CP bar - Gastroparesis was diagnosed and pt to have 4 hour GI exam pending. Decr to once/week next week likely.  OBJECTIVE IMPAIRMENTS: Objective impairments include dysarthria and dysphagia. These impairments are limiting patient from household responsibilities, ADLs/IADLs, effectively communicating at home and in community, and safety when swallowing.Factors affecting potential to achieve goals and functional outcome are  none .Marland Kitchen Patient will benefit from skilled SLP services to address above impairments and improve overall function.  REHAB POTENTIAL: Good  PLAN:  SLP FREQUENCY: 2x/week  SLP DURATION: 8 weeks or 17 visits  PLANNED INTERVENTIONS: Aspiration precaution training, Pharyngeal strengthening exercises, Diet toleration management , Environmental controls, Trials of upgraded texture/liquids, Cueing hierachy, Internal/external aids, SLP instruction and feedback, Compensatory strategies, and Patient/family education    [facility], [place] 10/13/2022, 7:01 PM

## 2022-10-13 NOTE — Telephone Encounter (Signed)
Patient Advocate Encounter  Prior Authorization for Kristin [name] has been approved.    PA# [id number] Effective dates: 2.5.24 through 2.5.27

## 2022-10-13 NOTE — Progress Notes (Unsigned)
Assessment/Plan:   1.  Parkinsons Disease             -Continue pramipexole 0.5 mg 3 times per day.  Higher dosages with SE             -Continue carbidopa/levodopa 25/100, 2/2/1 (can chew half to 1 in the middle of the night if awakens with tremor).  She will work on taking that last one at a regular time more faithfully to help avoid symptoms in the evening.             -She will continue Carbidopa/levodopa 50/200 at night.  This has definitely helped restless leg and has helped most of the nighttime cramping of the feet and legs. 2.  Depression and anxiety             - She is on a combination of Lexapro and Cymbalta 3.  Dysphagia             -last MBE May, 2021 with mild pharyngeal cervical esophageal deficits.  It was recommended to consider esophageal assessment.  She has an EGD upcoming to assess and an esophageal barium swallow 4.  Rheumatoid arthritis             -On methotrexate.  Following with [facility] 5.  REM behavior disorder             -Understands safety associated with this.  Does not want more medications. 6.  Constipation             -Following with Dr. Juliann Mule  -On Linzess     Subjective:   Kristin [name] was seen today in follow up for Parkinsons disease.  My previous records were reviewed prior to todays visit as well as outside records available to me.  Husband with patient and supplements history.  She has been to occupational and speech therapy recently and those notes are reviewed.  She has been following with Dr. Juliann Mule and last saw her October 05, 2022.  She talked about dysphagia and worsening constipation.  She is scheduled for EGD and esophageal barium swallow.  Current prescribed movement disorder medications: Carbidopa/levodopa 25/100, 2 tablets in the morning, 2 in the afternoon and 1 in the evening (7am/then between 11am-noon/4pm) Carbidopa/levodopa 50/200 at bedtime Pramipexole 0.5 mg 3 times per day   Prior meds: pramipexole (higher  dosages with mild compulsive behavior - cleaning/some shopping)   ALLERGIES:   Allergies  Allergen Reactions   Scopolamine Other (See Comments)    Panic hallu  Other Reaction(s): Hallucination, Not available   Prednisone Dermatitis    Facial ulcers  Other Reaction(s): facial swelling   Bactrim [Sulfamethoxazole-Trimethoprim] Other (See Comments)    Oral ulcers and rash   Penicillins Hives and Other (See Comments)    Did it involve swelling of the face/tongue/throat, SOB, or low BP? No  Did it involve sudden or severe rash/hives, skin peeling, or any reaction on the inside of your mouth or nose? No  Did you need to seek medical attention at a hospital or doctor's office? No  When did it last happen?      30+ years ago  If all above answers are "NO", may proceed with cephalosporin use.   Sulfa Antibiotics     Other Reaction(s): Not available    CURRENT MEDICATIONS:  Outpatient Encounter Medications as of 10/17/2022  Medication Sig   ALPRAZolam (XANAX) 0.25 MG tablet TAKE 1 TABLET BY MOUTH 2 TIMES DAILY AS NEEDED FOR  ANXIETY.   carbidopa-levodopa (SINEMET CR) 50-200 MG tablet TAKE 1 TABLET BY MOUTH EVERYDAY AT BEDTIME   carbidopa-levodopa (SINEMET IR) 25-100 MG tablet TAKE 2 TABLETS BY MOUTH AT 7AM, 2TABLETS AT 11AM, 1 TABLET AT 4PM   carvedilol (COREG) 3.125 MG tablet TAKE 1 TABLET BY MOUTH TWICE A DAY WITH MEALS   DULoxetine (CYMBALTA) 30 MG capsule TAKE 1 CAPSULE BY MOUTH EVERY DAY   escitalopram (LEXAPRO) 10 MG tablet Take 1 tablet (10 mg total) by mouth daily.   folic acid (FOLVITE) 1 MG tablet Take 1 mg by mouth 2 (two) times daily.   linaclotide (LINZESS) 145 MCG CAPS capsule Take 1 capsule (145 mcg total) by mouth daily before breakfast.   losartan (COZAAR) 50 MG tablet TAKE 1 TABLET BY MOUTH EVERY DAY   methotrexate (RHEUMATREX) 2.5 MG tablet Take by mouth.   omeprazole (PRILOSEC) 20 MG capsule TAKE 1 CAPSULE BY MOUTH 2 TIMES DAILY AS NEEDED.   Plecanatide (TRULANCE)  3 MG TABS Take 1 tablet (3 mg total) by mouth daily. (Patient not taking: Reported on 10/06/2022)   polyethylene glycol (MIRALAX / GLYCOLAX) 17 g packet Take 17 g by mouth daily.   pramipexole (MIRAPEX) 0.5 MG tablet Take 1 tablet (0.5 mg total) by mouth 3 (three) times daily.   rosuvastatin (CRESTOR) 10 MG tablet TAKE 1 TABLET BY MOUTH ON MONDAYS,WEDNESDAYS, AND FRIDAYS (Patient taking differently: TAKE 1 TABLET BY MOUTH pt takes on Sundays, Tuesdays and Thursdays)   Vitamin D, Ergocalciferol, (DRISDOL) 1.25 MG (50000 UNIT) CAPS capsule Take 1 capsule (50,000 Units total) by mouth every 7 (seven) days.   No facility-administered encounter medications on file as of 10/17/2022.    Objective:   PHYSICAL EXAMINATION:    VITALS:   There were no vitals filed for this visit.      GEN:  The patient appears stated age and is in NAD. HEENT:  Normocephalic, atraumatic.  The mucous membranes are moist.   Neurological examination:  Orientation: The patient is alert and oriented x3. Cranial nerves: There is good facial symmetry without significant hypomimia.  EOMI.   The speech is fluent and clear. Soft palate rises symmetrically and there is no tongue deviation. Hearing is intact to conversational tone. Sensation: Sensation is intact to light touch throughout Motor: Strength is at least antigravity x4.  Movement examination: Tone: There is normal tone today Abnormal movements: rare LLE tremor (same as previous) Coordination:  There is good RAMs today in the bilateral UE/LE.  No decremation Gait and Station: The patient has no difficulty arising out of a deep-seated chair without the use of the hands. The patients gait is slow and tenuous but she isn't shuffling.    I have reviewed and interpreted the following labs independently    Chemistry      Component Value Date/Time   NA 140 09/27/2022 0937   NA 144 12/05/2019 0935   K 4.3 09/27/2022 0937   CL 105 09/27/2022 0937   CO2 27  09/27/2022 0937   BUN 17 09/27/2022 0937   BUN 13 12/05/2019 0935   CREATININE 0.64 09/27/2022 0937   CREATININE 0.71 01/04/2015 1046      Component Value Date/Time   CALCIUM 9.3 09/27/2022 0937   ALKPHOS 90 09/27/2022 0937   AST 20 09/27/2022 0937   ALT 9 09/27/2022 0937   BILITOT 0.6 09/27/2022 0937   BILITOT 0.4 11/27/2016 1123       Lab Results  Component Value Date   WBC 6.1  09/27/2022   HGB 12.8 09/27/2022   HCT 38.4 09/27/2022   MCV 90.5 09/27/2022   PLT 198.0 09/27/2022    Lab Results  Component Value Date   TSH 1.10 01/12/2022     Total time spent on today's visit was *** minutes, including both face-to-face time and nonface-to-face time.  Time included that spent on review of records (prior notes available to me/labs/imaging if pertinent), discussing treatment and goals, answering patient's questions and coordinating care.  Cc:  Marin Olp, MD

## 2022-10-17 ENCOUNTER — Encounter: Payer: Self-pay | Admitting: Neurology

## 2022-10-17 ENCOUNTER — Ambulatory Visit (INDEPENDENT_AMBULATORY_CARE_PROVIDER_SITE_OTHER): Payer: Medicare Other | Admitting: Neurology

## 2022-10-17 VITALS — BP 122/78 | HR 77 | Ht 63.0 in | Wt 240.2 lb

## 2022-10-17 DIAGNOSIS — G20A1 Parkinson's disease without dyskinesia, without mention of fluctuations: Secondary | ICD-10-CM

## 2022-10-17 MED ORDER — CARBIDOPA-LEVODOPA 25-100 MG PO TABS: ORAL_TABLET | ORAL | 2 refills | Status: DC

## 2022-10-17 NOTE — Patient Instructions (Signed)
Local and Online Resources for Power over Parkinson's Group  February 2024   LOCAL [place] PARKINSON'S GROUPS   Power over Parkinson's Group:    Power Over Parkinson's Patient Education Group will be Wednesday, February 14th-*Hybrid meting*- in person at [facility] location and via [facility], 2:00-3:00 pm.   Starting in November, Power over [employer] and Care Partner Groups will meet together, with plans for separate break out session for caregivers (*this will be evolving over the next few months) Upcoming Power over Parkinson's Meetings/Care Partner Support:  2nd Wednesdays of the month at 2 pm:  February 14th, March 13th  [place] at [email or web address] if interested in participating in this group    [place]! Moves Classes in [place]!  Starting September 28, 2022.  Thursdays, January 25-November 09, 2022 at 11:45 am.  [facility].  Free to participate (through grant with [employer]) and registration required.  Please contact Corwin Levins at [email or web address] to register. Let's Try Pickleball-$25 for 6 weeks of Pickleball, starting February 2nd.  Contact Corwin Levins for more details.  [email or web address] Parkinson's Community Game Night at [facility], Date TBA in Late February, email Sarah for details [email or web address] THRIVE, an exercise group for early-onset Parkinson's Disease will resume meeting in February, email Sarah for details [email or web address] Parkinson's CarePartner Group for Men is in the works, if interested email [email or web address] ACT FITNESS Chair Yoga classes "Train and Gain", Fridays 10 am, ACT [employer].  Contact Gina at [phone].  PWR! Moves Dynegy Instructor-Led Classes offering at [employer]!  TUESDAYS and Wednesdays 1-2 pm.   Contact Vonna Kotyk at  [email or web address]  or  [phone] (Tuesday classes are modified for chair and standing only) Drumming for Parkinson's will be held on 2nd and 4th Mondays at 11:00 am.   Located at the [place] ([place]  Contact Doylene Canning at [email or web address] or [phone]  Dance for Parkinson 's classes will be on Tuesdays 9:30am-10:30am starting back in February. Located in the [employer] , in the first floor of the [employer] ([place].) To register:  [email or web address] or [place] Class, Mondays at 11 am.  Call [phone] for details SAVE THE DATE and REGISTER:  [place] Chapter of [place]:  [employer].  Conversations about Parkinson's.  Saturday, November 04, 2022, 9:00 am-2:00 pm.  [place], *In person or online via [place]*.  Register at [email or web address] or call Beverlee Nims at [phone].  [place]:  [email or web address]  PD Health at Home continues:  Mindfulness Mondays, Wellness Wednesdays, Fitness Fridays   Upcoming Education:   Parkinson's 101.  Wednesday, February 7th, 1-2 pm The Changing Landscape of Intimacy.  Wednesday, February 14th, 1-2 pm New to Parkinson's:  Care Partner Basics.  Wednesday, February 28th, 1-2 pm Expert Briefing:  Understanding Pain in Parkinson's.   Wednesday, April 10th,  1-2 pm  Register for virtual education and Patent attorney (webinars) at [email or web address] Please check out their website to sign up for emails and see their full online offerings      [place]:  [email or web address]   Third Thursday Webinars:  On the third Thursday of every month at 12 p.m. ET, join our free live webinars to learn about various aspects of living with Parkinson's disease and our work to speed medical breakthroughs.  Upcoming  Webinar:  Therapies  for Tomorrow:  How Better Clinical Trial Design Leads to Better Treatments.  Thursday, February 15th at 12 noon. Check out additional information on their website to see their full online offerings    [facility]:  [email or web address]  Upcoming Webinar:   [place].  Thursday, February 8th, 12 noon Webinar Series:  Living with Parkinson's Meetup.   Third Thursdays each month, 3 pm  Care Partner Monthly Meetup.  With Robin Searing Phinney.  First Tuesday of each month, 2 pm  Check out additional information to Live Well Today on their website    Parkinson and Movement Disorders (PMD) Alliance:  www.[email or web address]  [employer]uroLife Online:  Online Education Events  Sign up for emails, which are sent weekly to give you updates on programming and online offerings    Parkinson's Association of the [place]:  www.[email or web address]  Information on online support groups, education events, and online exercises including Yoga, Parkinson's exercises and more-LOTS of information on links to PD resources and online events  Virtual Support Group through Parkinson's Association of the [place]; next one is scheduled for Wednesday, Feb 7th  MOVEMENT AND EXERCISE OPPORTUNITIES  PWR! Moves Classes at [place].  Wednesdays 10 and 11 am.   Contact Amy Marriott, PT [email or web address] if interested.  PWR! Moves Class offerings at [employer]. *TUESDAYS* and Wednesdays 1-2 pm.    Contact Vonna Kotyk at  [email or web address]    Parkinson's Wellness Recovery (PWR! Moves)  [email or web address]  Info on the PWR! Virtual Experience:  You will have access to our expertise?through self-assessment, guided plans that start with the PD-specific fundamentals, educational content, tips, Q&A with an expert, and a growing Art therapist of PD-specific pre-recorded and live exercise classes of varying types and  intensity - both physical and cognitive! If that is not enough, we offer 1:1 wellness consultations (in-person or virtual) to personalize your PWR! Research scientist (medical).   Philip Fridays:   As part of the PD Health @ Home program, this free video series focuses each week on one aspect of fitness designed to support people living with Parkinson's.? These weekly videos highlight the [place] fitness guidelines for people with Parkinson's disease.  ModemGamers.si  Dance for PD website is offering free, live-stream classes throughout the week, as well as links to [employer] of classes:  [email or web address]  Virtual dance and Pilates for Parkinson's classes: Click on the Community Tab> Parkinson's Movement Initiative Tab.  To register for classes and for more information, visit [email or web address] and click the "community" tab.   [employer] Parkinson's Cycling Classes   [facility]:  Thursdays @ Noon-Live classes at [employer] ([employer] at [email or web address]?or [phone])  [employer]: Virtual Classes Mondays and Thursdays Jeanette Caprice classes Tuesday, Wednesday and Thursday (contact [place] at [email or web address] ?or [phone])  [place]  Varied levels of classes are offered Tuesdays and Thursdays at [employer].   Stretching with Verdis Frederickson weekly class is also offered for people with Parkinson's  To observe a class or for more information, call [phone] or email Hezzie Bump at [email or web address]   ADDITIONAL SUPPORT AND RESOURCES  Well[employer]Spring [employer]:Online Caregiver Education Opportunities:  www.well-[email or web address]/caregiver-education/caregiver-support-group.  You may also contact Vickki Muff at [email or web address] or [phone].     Family Caregiver [phone].  Thursday, March 7th, 10:15-1:45 at  [facility].  Register with [facility] (see above) Well-Spring Navigator:  Just1Navigator program, a?free service to help individuals and families through the journey of determining care for older adults.  The "Navigator" is a Education officer, museum, Arnell Asal, who will speak with a prospective client and/or loved ones to provide an assessment of the situation and a set of recommendations for a personalized care plan -- all free of charge, and whether?Well-Spring [employer] offers the needed service or not. If the need is not a service we provide, we are well-connected with reputable programs in town that we can refer you to.  www.well-springsolutions.org or to speak with the Navigator, call [phone].

## 2022-10-19 ENCOUNTER — Ambulatory Visit: Payer: Medicare Other | Admitting: Occupational Therapy

## 2022-10-19 DIAGNOSIS — M6281 Muscle weakness (generalized): Secondary | ICD-10-CM | POA: Diagnosis not present

## 2022-10-19 DIAGNOSIS — R278 Other lack of coordination: Secondary | ICD-10-CM | POA: Diagnosis not present

## 2022-10-19 DIAGNOSIS — R29818 Other symptoms and signs involving the nervous system: Secondary | ICD-10-CM

## 2022-10-19 DIAGNOSIS — R1312 Dysphagia, oropharyngeal phase: Secondary | ICD-10-CM | POA: Diagnosis not present

## 2022-10-19 DIAGNOSIS — R471 Dysarthria and anarthria: Secondary | ICD-10-CM | POA: Diagnosis not present

## 2022-10-19 NOTE — Therapy (Signed)
OUTPATIENT OCCUPATIONAL THERAPY  Treatment Note & Discharge Note  Patient Name: Kristin [name] MRN: [id number] DOB:Feb 04, 1951, 72 y.o., female Today's Date: 09/13/2022  PCP: Marin Olp, MD REFERRING PROVIDER: Tat, Eustace Quail, DO  OCCUPATIONAL THERAPY DISCHARGE SUMMARY  Visits from Start of Care: 8  Current functional level related to goals / functional outcomes: Pt with improvements in fine motor coordination and gross motor control.  Have reviewed and implemented modifications to routines and hand placement/grasp pattern with functional, routine tasks.     Remaining deficits: bradykinesia   Education / Equipment: Large amplitude and coordination HEP, community resources   Patient agrees to discharge. Patient goals were met. Patient is being discharged due to meeting the stated rehab goals..     END OF SESSION:  OT End of Session - 10/19/22 0807     Visit Number 8    Number of Visits 13    Date for OT Re-Evaluation 10/20/22    Authorization Type Medicare A&B    OT Start Time 0805    OT Stop Time 0845    OT Time Calculation (min) 40 min                    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   Hyperlipidemia    Hypertension    Infertility, female    Low back pain 10/17/2007   Qualifier: Diagnosis of  By: [name], [name]    Measles as a child   Obesity    Osteoarthritis    Parkinson  disease    Plantar fasciitis of left foot 06/19/2012   Preventative health care 12/19/2015   Rheumatoid arthritis ([place]) 01/08/2018   Rosacea 10/05/2014   Swallowing difficulty    Urinary frequency 02/25/2013   Visual floaters 05/11/2014   Past Surgical History:  Procedure Laterality Date   ABDOMINAL HYSTERECTOMY  2006   total   esophageal     stretching   HERNIA REPAIR N/A 2022   hiatal hernia   laporoscopy     LEFT HEART CATH AND CORONARY ANGIOGRAPHY N/A 06/02/2019   Procedure: LEFT HEART CATH AND CORONARY ANGIOGRAPHY;  Surgeon: Jettie Booze, MD;  Location: [facility];  Service: Cardiovascular;  Laterality: N/A;   TONSILLECTOMY     TOTAL HIP ARTHROPLASTY Right 2010   UPPER GASTROINTESTINAL ENDOSCOPY     wisdom teeth extracted     Patient Active Problem List   Diagnosis Date Noted   Vitamin B 12 deficiency 05/30/2021   Anemia 03/16/2020   Coronary artery disease involving native coronary artery of native heart without angina pectoris [id number]   Diastolic dysfunction [id number]   Rheumatoid arthritis ([place]) 01/08/2018   Hand pain, right 10/15/2017   Vitamin D deficiency 12/20/2016   Prediabetes 12/20/2016   Shortness of breath on exertion 11/27/2016  Morbid obesity ([place])    Constipation 12/07/2015   BCC (basal cell carcinoma of skin) 10/05/2014   Rosacea 10/05/2014   Parkinson's disease ([place]) 05/11/2014   Screening for cervical cancer 07/17/2012   Foot pain, bilateral 06/19/2012   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   Depression with anxiety 11/02/2009   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   Lumbar back pain with radiculopathy affecting lower extremity 10/17/2007   Essential hypertension 07/05/2007   Osteoarthritis 07/05/2007   Hyperlipidemia 06/26/2007   H/O: iron deficiency anemia 05/08/2007   Hiatal hernia with GERD 05/08/2007    ONSET DATE: referral date 08/03/22  REFERRING DIAG: G20.B2 (ICD-10-CM) - Parkinson's disease with  dyskinesia and fluctuating manifestations  THERAPY DIAG:  Other symptoms and signs involving the nervous system  Muscle weakness (generalized)  Other lack of coordination  Rationale for Evaluation and Treatment: Rehabilitation  SUBJECTIVE:   SUBJECTIVE STATEMENT: Pt reports "I found out this morning how long it really takes to get ready in the morning." Pt accompanied by: self  PERTINENT HISTORY: PD, rheumatoid arthritis, depression and anxiety  PRECAUTIONS: Fall  WEIGHT BEARING RESTRICTIONS: No  PAIN:  Are you having pain? No  FALLS: Has patient fallen in last 6 months? No  LIVING ENVIRONMENT: Lives with: lives with their family and lives with their spouse (and adult son lives with them, helps with going to the grocery store, putting away groceries, and odd jobs around the house) Lives in: House/apartment Stairs: Yes: Internal: full flight of steps, but lives on main floor with no need to use steps;  and External: 1 steps; none Has following equipment at home: shower chair, Grab bars, and hand held shower head, and walking stick  PLOF: Independent and Independent with basic ADLs, requires increased time for ADLs  PATIENT GOALS: to get a little more efficient with movements and every day tasks  OBJECTIVE:   HAND DOMINANCE: Left  ADLs: Overall ADLs: Mod I, however reports that it takes quite a bit longer to put on shoes, socks, and jacket Transfers/ambulation related to ADLs: utilizes walking stick with all mobility in home and in community Eating: reports that she has changed what she eats, especially when out at restaurant, with no longer eating soups or salad due to increased time to manage LB Dressing: increased time to put on shoes and socks Equipment: Shower seat with back, Grab bars, Walk in shower, and hand held shower head  IADLs: Shopping: son does the shopping Light housekeeping: laundry, empties the dishwasher Meal Prep: completes all the cooking,  reports it does not go "as smoothly" as it has; easily distracted if phone rings or gets distracted by anything Community mobility: still driving Medication management: Mod I, uses weekly pill box Handwriting: 90% legible and Increased time  MOBILITY STATUS: Needs Assist: utilizing walking stick with all mobility  POSTURE COMMENTS:  rounded shoulders  ACTIVITY TOLERANCE: Activity tolerance: WFL, reports taking longer for all ADLs and IADLs  FUNCTIONAL OUTCOME MEASURES: Physical performance test: #1 (handwriting):13.28 sec*     #2 (self-feeding): 13.57 sec     #4 (jacket on/off): 16.25 sec  UPPER EXTREMITY ROM:  WFL bilaterally   UPPER EXTREMITY MMT:   WFL bilaterally   COORDINATION: 9 Hole Peg test: Right: 22.10 sec; Left: 26.90 sec Box and Blocks:  Right 56 blocks (hitting barrier x4), Left 60 blocks Tremors: Resting, LLE  SENSATION: WFL  COGNITION: Overall cognitive status: Within functional limits for tasks assessed  VISION: Subjective report: notes slight change over time Baseline vision: Wears glasses  all the time Visual history:  sees a retina specialist, dr does note beginnings of cataracts  VISION ASSESSMENT: Not tested  OBSERVATIONS: mild resting tremor LLE during evaluation   TODAY'S TREATMENT:                                                                             10/19/22 Coordination: with each UE and BUEs as appropriate, including: rotating small ball w/ fingertips, tossing/catching ball w/ ipsilateral UE, tossing/catching ball from hand to hand, rotating 2 golf balls in hand, picking up coins and stacking them, and picking up 5-10 coins 1 at a time and translating palm to fingertips to place in a stack, and rotating coins in fingertips. Pt with difficulty with rotating ball with fingertips in R hand, therefore modified task to rotating on table top as precursor activity then attempting in fingertips again.  Discussed functional carryover with focus on  gross motor movements with ball rotation and ball toss. Box and Blocks: LUE 52 (but did not hit barrier).  Pt demonstrating decreased speed, however improved gross motor control. OF NOTE, pt took PD meds at beginning of session due to not having time to take prior to session.  Discussed impacts of PD med timing to functional tasks and even mobility.    10/12/22 9 hole peg test:  R: 25.75 and L: 23.03.  Discussed improved functional use of RUE, however due to RUE non-dominant UE decided to d/c goal. Box and Blocks: RUE: 53 (hit barrier x2), LUE: 59.  Reviewed functional carryover with Ment[place] as pt reports occasionally hitting edge of counter top or table and breaking glasses due to decreased [place]. Self-care: Reviewed goals and progress towards goals as well as education on therapy schedule/recommendation for therapy f/u with PD.  Engaged in discussion about initiating therapy with mental health professional as pt reporting increased awareness of additional issues impacting her engagement in PD exercise classes and support group.   10/10/22 Large amplitude: engaged in card flipping with focus on large amplitude forearm supination and hand opening.  OT providing min cues for increased amplitude. Sliding cards off deck with thumb with focus on large amplitude push from thumb.  Completed bilaterally, noted decreased motor control and amplitude in dominant L hand.   Self-feeding: Pt reports occasional shaking in L hand with self-feeding, however states it is not like a tremor.  Pt completed PPT #2 self-feeding in 9.78 sec this session with no shaking or tremors.  Attempted to problem solve increased ease with eating salad, as pt continues to report difficulty with stabbing lettuce. LB dressing: pt reports she is able to cross her R leg over her opposite knee with increased ease, allowing her to don and fasten her socks and shoes with increased ease and independence.  Pt reports that she does have a shoe horn and  will use it occasionally.  Community resources: provided pt with handouts for community PD programs as well as printed class and aquatic schedule at [employer], as pt reports that she is a member there.  Encouraged pt to make a loose daily schedule to increase consistent engagement in exercise programs/classes.   PATIENT EDUCATION: Education details: ongoing condition specific education, see above  Person  educated: Patient Education method: Consulting civil engineer, Demonstration, Verbal cues, and Handouts Education comprehension: verbalized understanding and returned demonstration  HOME EXERCISE PROGRAM: Large amplitude UE HEP (see pt instructions)  Coordination HEP (see pt instructions)   GOALS: Goals reviewed with patient? Yes  SHORT TERM GOALS: Target date: 09/22/22  Pt will be Independent with PD specific HEP. Baseline: Goal status: MET - 10/03/22  2.  Pt will verbalize understanding of adaptive techniques, positioning, and body mechanics to increase independence, ease, and sequencing with ADLs and IADLs. Baseline:  Goal status: MET - 09/26/22    3.  Pt will demonstrate increased ease with dressing as evidenced by decreasing PPT#4(don/ doff jacket) to 12 secs or less Baseline: 16.25 sec Goal status: IN PROGRESS  4.  Pt will demonstrate improved sustained grasp on items to engage in IADLs (emptying dishwasher, setting table, etc.)  Baseline:  Goal status: MET - 10/03/22   LONG TERM GOALS: Target date: 10/20/22  Pt will verbalize or demonstrate improvements in speed and efficiency with donning socks and shoes. Baseline:  Goal status: MET - 10/12/22  2.  Pt will demonstrate improved ease with feeding as evidenced by decreasing PPT#2 (self feeding) by 3 secs Baseline: 13.57 sec Goal status: MET - 9.78 sec - 10/10/22  3.  Pt will demonstrate improved fine motor coordination for ADLs as evidenced by decreasing 9 hole peg test score for RUE by 3 secs Baseline: Right: 22.10 sec; Left: 26.90  sec Goal status: REVISED - D/C goal as pt with PD impairments on RUE which is non-dominant and completing 9 hole peg test with R: 25.75 and L: 23.03 on 10/12/22  4.  Pt will demonstrate improved UE functional use for ADLs as evidenced by increasing box/ blocks score by 4 blocks with RUE (hitting barrier </+ to 1 time) Baseline: Right 56 blocks (hitting barrier x4), Left 60 blocks Goal status: NOT MET - 52 (did not hit barrier!) on 10/19/22  5.  Pt will demonstrate ability to engage in dual task IADL activity with good alternating attention to complete simple functional task (simple snack prep, laundry task, etc) without error. Baseline:  Goal status: MET - 10/19/22   ASSESSMENT:  CLINICAL IMPRESSION: Pt demonstrating improvements in fine motor control tasks as well as modifications of hand placement and technique during self-care and self-feeding tasks.  Pt continues to demonstrate mild decreased motor control in RUE as noted during Box and Blocks assessment with overall slowing but improved motor control this session, despite having just recently taken PD meds.  Pt reports noticing improvements in routine tasks, especially in regards to routine with taking medications, and coordination with opening and dispensing medications.  Reiterated importance of timeliness with medications, especially in regards to functional carryover with mobility and coordination.    PERFORMANCE DEFICITS: in functional skills including ADLs, IADLs, coordination, dexterity, strength, Fine motor control, Gross motor control, mobility, balance, body mechanics, endurance, decreased knowledge of precautions, decreased knowledge of use of DME, and UE functional use, cognitive skills including attention and sequencing, and psychosocial skills including environmental adaptation, habits, and routines and behaviors.   IMPAIRMENTS: are limiting patient from ADLs and IADLs.   CO-MORBIDITIES: may have co-morbidities  that affects  occupational performance. Patient will benefit from skilled OT to address above impairments and improve overall function.  MODIFICATION OR ASSISTANCE TO COMPLETE EVALUATION: No modification of tasks or assist necessary to complete an evaluation.  OT OCCUPATIONAL PROFILE AND HISTORY: Problem focused assessment: Including review of records relating to presenting problem.  CLINICAL DECISION MAKING: LOW - limited treatment options, no task modification necessary  REHAB POTENTIAL: Good  EVALUATION COMPLEXITY: Low    PLAN:  OT FREQUENCY: 1-2x/week  OT DURATION: 8 weeks  PLANNED INTERVENTIONS: self care/ADL training, therapeutic exercise, therapeutic activity, neuromuscular re-education, balance training, functional mobility training, ultrasound, compression bandaging, moist heat, cryotherapy, patient/family education, cognitive remediation/compensation, psychosocial skills training, energy conservation, coping strategies training, and DME and/or AE instructions  RECOMMENDED OTHER SERVICES: NA  CONSULTED AND AGREED WITH PLAN OF CARE: Patient  PLAN FOR NEXT SESSION:  Screen in ~6 months   Lovella Hardie, [place], OTR/L 10/19/2022, 8:07 AM

## 2022-10-19 NOTE — Patient Instructions (Signed)
Coordination Exercises  Perform the following exercises for 15 minutes 1 times per day. Perform with both hand(s). Perform using big movements.  Flipping Cards: Place deck of cards on the table. Flip cards over by opening your hand big to grasp and then turn your palm up big. Deal cards: Hold 1/2 or whole deck in your hand. Use thumb to push card off top of deck with one big push.  Rotate ball with fingertips: Pick up with fingers/thumb and move as much as you can with each turn/movement (clockwise and counter-clockwise). Toss ball from one hand to the other: Toss big/high. Toss ball in the air and catch with the same hand: Toss big/high. Rotate 2 golf balls in your hand: Both directions.  Pick up coins and place in coin bank or container: Pick up with big, intentional movements. Do not drag coin to the edge. Pick up coins and stack one at a time: Pick up with big, intentional movements. Do not drag coin to the edge. (5-10 in a stack) Pick up 5-10 coins one at a time and hold in palm. Then, move coins from palm to fingertips one at time and place in coin bank/container. Pick up 5-10 coins one at a time and hold in palm. Then, move coins from palm to fingertips one at a time to stack.  Perform "Flicks"/hand stretches (PWR! Hands): Close hands then flick out your fingers with focus on opening hands, pulling wrists back, and extending elbows like you are pushing.

## 2022-10-30 ENCOUNTER — Encounter (HOSPITAL_COMMUNITY)
Admission: RE | Admit: 2022-10-30 | Discharge: 2022-10-30 | Disposition: A | Discharge status: Home / Self Care | Payer: Medicare Other | Source: Ambulatory Visit | Attending: Gastroenterology | Admitting: Gastroenterology

## 2022-10-30 DIAGNOSIS — R198 Other specified symptoms and signs involving the digestive system and abdomen: Secondary | ICD-10-CM | POA: Insufficient documentation

## 2022-10-30 DIAGNOSIS — R1013 Epigastric pain: Secondary | ICD-10-CM | POA: Insufficient documentation

## 2022-10-30 DIAGNOSIS — K30 Functional dyspepsia: Secondary | ICD-10-CM | POA: Diagnosis not present

## 2022-10-30 MED ORDER — TECHNETIUM TC 99M SULFUR COLLOID
2.0800 | Freq: Once | INTRAVENOUS | Status: AC
  Administered 2022-10-30: 2.08 via INTRAVENOUS

## 2022-11-07 ENCOUNTER — Telehealth: Payer: Self-pay | Admitting: Gastroenterology

## 2022-11-07 ENCOUNTER — Encounter: Payer: Self-pay | Admitting: Gastroenterology

## 2022-11-07 ENCOUNTER — Ambulatory Visit: Payer: Medicare Other

## 2022-11-07 NOTE — Telephone Encounter (Signed)
Calling for her GES results.

## 2022-11-07 NOTE — Telephone Encounter (Signed)
Imbound call from pt she wanted to speak with a nurse regarding some lab results she got done 2/26.Please advise

## 2022-11-17 ENCOUNTER — Encounter: Payer: Self-pay | Admitting: Neurology

## 2022-11-20 ENCOUNTER — Ambulatory Visit: Payer: Medicare Other | Admitting: Behavioral Health

## 2022-11-20 NOTE — Progress Notes (Unsigned)
                Jameka Ivie L Saurav Crumble, LMFT 

## 2022-11-26 ENCOUNTER — Other Ambulatory Visit: Payer: Self-pay | Admitting: Family Medicine

## 2022-11-26 ENCOUNTER — Other Ambulatory Visit: Payer: Self-pay | Admitting: Gastroenterology

## 2022-11-26 ENCOUNTER — Other Ambulatory Visit: Payer: Self-pay | Admitting: Neurology

## 2022-11-26 DIAGNOSIS — G20A1 Parkinson's disease without dyskinesia, without mention of fluctuations: Secondary | ICD-10-CM

## 2022-11-27 NOTE — Telephone Encounter (Signed)
Please advise, see message from 11/07/2022, thank you.

## 2022-11-28 NOTE — Telephone Encounter (Signed)
She can add MiraLAX along with Trulance.  Unfortunately with Parkinson's and gastroparesis the options are extremely limited.

## 2022-12-04 ENCOUNTER — Ambulatory Visit: Payer: Medicare Other | Admitting: Behavioral Health

## 2022-12-10 ENCOUNTER — Other Ambulatory Visit: Payer: Self-pay | Admitting: Neurology

## 2022-12-18 DIAGNOSIS — M0609 Rheumatoid arthritis without rheumatoid factor, multiple sites: Secondary | ICD-10-CM | POA: Diagnosis not present

## 2022-12-18 DIAGNOSIS — Z79899 Other long term (current) drug therapy: Secondary | ICD-10-CM | POA: Diagnosis not present

## 2022-12-18 DIAGNOSIS — K30 Functional dyspepsia: Secondary | ICD-10-CM | POA: Diagnosis not present

## 2022-12-20 NOTE — Progress Notes (Signed)
Kristin [name]    [id number]    [birth year]  Primary Care Physician:Hunter, Aldine Contes, MD  Referring Physician: Shelva Majestic, MD [address] [place],  [place] [address]   Chief complaint:  Dysphagia, constipation, belching Chief Complaint  Patient presents with   Gastroparesis    Patient states she was told she has delayed gastric emptying at her EGD. Patient states she started the strict gastroparesis diet and she has not felt any better and her constipation is worse while on the diet.    HPI: 72 year old very pleasant female with history of Parkinson's disease, rheumatoid arthritis, large hiatal hernia s/p repair with mesh and gastropexy 2022.  I last saw her on 10-05-22. At that time, she was choking on solids and liquids and was feeling epigastric pressure with constant belching. She had worsening constipation with irregular BM once every 1-2 weeks.  Underwent EGD and gastric emptying study which showed findings suggestive of gastroparesis   Today, she reports concerns regarding gastroparesis and constipation. We discussed how Parkinson's is impacting her GI issues and influencing her delayed gastric emptying.She reports taking her Parkinson's medications 4x daily and reports taking Trulance 3 mg but is still struggling with irregular bowel habits with constipation.She report taking Miralax but  states it works too well as it causes her to have loose stool and urgency.   Denies diarrhea, nausea, blood in stool, black stool, vomiting, abdominal pain, bloating, unintentional weight loss, reflux, dysphagia.  She reports being diagnosed with Parkinson's 10 years ago.   GI Hx: NUCLEAR MEDICINE GASTRIC EMPTYING SCAN 10-30-22 Expected location of the stomach in the left upper quadrant. Ingested meal empties the stomach slowly and incompletely over the course of the study.   10% emptied at 1 hr ( normal >= 10%)   27% emptied at 2 hr ( normal >= 40%)   33% emptied  at 3 hr ( normal >= 70%)   42% emptied at 4 hr ( normal >= 90%)   IMPRESSION: Scintigraphic findings compatible with delayed gastric emptying.  EGD 10-06-22 - Z-line regular, 38 cm from the incisors. - LA Grade B reflux esophagitis with no bleeding.  - A large amount of food (residue) in the stomach. Procedure was aborted.  - No specimens collected.  EGD June 07, 2020 - Benign-appearing esophageal stenosis. Dilated. - Large hiatal hernia. - Erosive gastropathy with no stigmata of recent bleeding associated with the hiatal hernia, c/w Cameron erosions. - Multiple gastric polyps. - Normal duodenal bulb and second portion of the duodenum. Biopsied.   Colonoscopy June 07, 2020 -Mild diverticulosis in the sigmoid colon. There was evidence of an impacted diverticulum. - Internal hemorrhoids. - The examination was otherwise normal on direct and retroflexion views.     Current Outpatient Medications:    ALPRAZolam (XANAX) 0.25 MG tablet, TAKE 1 TABLET BY MOUTH 2 TIMES DAILY AS NEEDED FOR ANXIETY., Disp: 30 tablet, Rfl: 1   carbidopa-levodopa (SINEMET CR) 50-200 MG tablet, TAKE 1 TABLET BY MOUTH EVERYDAY AT BEDTIME, Disp: 90 tablet, Rfl: 0   carbidopa-levodopa (SINEMET IR) 25-100 MG tablet, TAKE 2 TABLETS BY MOUTH AT 7AM, 2TABLETS AT 11AM, 1 TABLET AT 4PM, Disp: 450 tablet, Rfl: 2   carvedilol (COREG) 3.125 MG tablet, TAKE 1 TABLET BY MOUTH TWICE A DAY WITH MEALS, Disp: 180 tablet, Rfl: 3   DULoxetine (CYMBALTA) 30 MG capsule, TAKE 1 CAPSULE BY MOUTH EVERY DAY, Disp: 90 capsule, Rfl: 1  escitalopram (LEXAPRO) 10 MG tablet, Take 1 tablet (10 mg total) by mouth daily., Disp: 90 tablet, Rfl: 3   folic acid (FOLVITE) 1 MG tablet, Take 1 mg by mouth 2 (two) times daily., Disp: , Rfl:    losartan (COZAAR) 50 MG tablet, TAKE 1 TABLET BY MOUTH EVERY DAY, Disp: 90 tablet, Rfl: 3   omeprazole (PRILOSEC) 20 MG capsule, TAKE 1 CAPSULE BY MOUTH 2 TIMES DAILY AS NEEDED., Disp: 180 capsule, Rfl: 1    polyethylene glycol (MIRALAX / GLYCOLAX) 17 g packet, Take 17 g by mouth daily., Disp: , Rfl:    pramipexole (MIRAPEX) 0.5 MG tablet, TAKE 1 TABLET BY MOUTH 3 TIMES DAILY., Disp: 270 tablet, Rfl: 0   rosuvastatin (CRESTOR) 10 MG tablet, TAKE 1 TABLET BY MOUTH ON MONDAYS,WEDNESDAYS, AND FRIDAYS (Patient taking differently: TAKE 1 TABLET BY MOUTH pt takes on Sundays, Tuesdays and Thursdays), Disp: 39 tablet, Rfl: 3   TRULANCE 3 MG TABS, TAKE 1 TABLET BY MOUTH DAILY, Disp: 30 tablet, Rfl: 1   Vitamin D, Ergocalciferol, (DRISDOL) 1.25 MG (50000 UNIT) CAPS capsule, Take 1 capsule (50,000 Units total) by mouth every 7 (seven) days., Disp: 13 capsule, Rfl: 1    Allergies as of 12/26/2022 - Review Complete 12/26/2022  Allergen Reaction Noted   Scopolamine Other (See Comments) 12/14/2020   Prednisone Dermatitis 12/04/2011   Bactrim [sulfamethoxazole-trimethoprim] Other (See Comments) 08/27/2012   Penicillins Hives and Other (See Comments) 08/14/1971   Sulfa antibiotics  08/04/2012    Past Medical History:  Diagnosis Date   Abnormal vaginal Pap smear    Acute pharyngitis 02/25/2013   Anemia    Anxiety    Arthritis    BCC (basal cell carcinoma of skin) 10/05/2014   On back   Chicken pox as a child   Chronic UTI    sees dr Vonita Moss   Constipation 12/07/2015   Depression with anxiety 11/02/2009   Qualifier: Diagnosis of  By: Mayford Knife, LPN, Bonnye M    Dermatitis 07/17/2012   Esophageal stricture 1994   Fibroids    Foot pain, bilateral 06/19/2012   GERD (gastroesophageal reflux disease)    Hiatal hernia    Hyperglycemia 08/19/2013   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   Hyperlipidemia    Hypertension    Infertility, female    Low back pain 10/17/2007   Qualifier: Diagnosis of  By: Lovell Sheehan MD, [name]    Measles as a child   Obesity    Osteoarthritis    Parkinson disease    Plantar fasciitis of left foot 06/19/2012   Preventative health care 12/19/2015   Rheumatoid arthritis  01/08/2018   Rosacea 10/05/2014   Swallowing difficulty    Urinary frequency 02/25/2013   Visual floaters 05/11/2014    Past Surgical History:  Procedure Laterality Date   ABDOMINAL HYSTERECTOMY  2006   total   esophageal     stretching   HERNIA REPAIR N/A 2022   hiatal hernia   laporoscopy     LEFT HEART CATH AND CORONARY ANGIOGRAPHY N/A 06/02/2019   Procedure: LEFT HEART CATH AND CORONARY ANGIOGRAPHY;  Surgeon: Corky Crafts, MD;  Location: [facility] INVASIVE CV LAB;  Service: Cardiovascular;  Laterality: N/A;   TONSILLECTOMY     TOTAL HIP ARTHROPLASTY Right 2010   UPPER GASTROINTESTINAL ENDOSCOPY     wisdom teeth extracted      Family History  Problem Relation Age of Onset   Other Mother        arrythmia   Mental illness  Mother        bipolar   Hyperlipidemia Mother    Thyroid disease Mother    Depression Mother    Bipolar disorder Mother    Breast cancer Mother 22   Heart disease Father    Arthritis Father        rheumatoid   Hypertension Father    Hyperlipidemia Father    Depression Sister    Mental illness Sister        bipolar   Parkinson's disease Sister    Arthritis Sister    Arthritis Sister    Arthritis Sister    Breast cancer Maternal Aunt 35   Breast cancer Maternal Uncle        42s   Heart attack Paternal Uncle    Heart attack Paternal Grandfather    Breast cancer Cousin 105   Colon cancer Neg Hx    Esophageal cancer Neg Hx    Rectal cancer Neg Hx    Stomach cancer Neg Hx     Social History   Socioeconomic History   Marital status: Married    Spouse name: Not on file   Number of children: 1   Years of education: Not on file   Highest education level: Master's degree (e.g., MA, MS, MEng, MEd, MSW, MBA)  Occupational History   Occupation: Retired International aid/development worker  Tobacco Use   Smoking status: Never   Smokeless tobacco: Never  Vaping Use   Vaping Use: Never used  Substance and Sexual Activity   Alcohol use: Yes    Comment:  occasional wine   Drug use: No   Sexual activity: Not Currently    Partners: Male  Other Topics Concern   Not on file  Social History Narrative   Lives with husband, 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 3 grandsons- 2 in [place] and 55 year old in [place] in 2023      Hobbies: book club, time with friends, time with couples friends, plays pianos   Social Determinants of Health   Financial Resource Strain: Low Risk  (11/03/2021)   Overall Financial Resource Strain (CARDIA)    Difficulty of Paying Living Expenses: Not hard at all  Food Insecurity: No Food Insecurity (11/03/2021)   Hunger Vital Sign    Worried About Running Out of Food in the Last Year: Never true    Ran Out of Food in the Last Year: Never true  Transportation Needs: No Transportation Needs (11/03/2021)   PRAPARE - Administrator, Civil Service (Medical): No    Lack of Transportation (Non-Medical): No  Physical Activity: Sufficiently Active (12/07/2021)   Exercise Vital Sign    Days of Exercise per Week: 5 days    Minutes of Exercise per Session: 60 min  Stress: No Stress Concern Present (11/03/2021)   [employer] of Occupational Health - Occupational Stress Questionnaire    Feeling of Stress : Not at all  Social Connections: Moderately Integrated (11/03/2021)   Social Connection and Isolation Panel [NHANES]    Frequency of Communication with Friends and Family: More than three times a week    Frequency of Social Gatherings with Friends and Family: More than three times a week    Attends Religious Services: Never    Database administrator or Organizations: Yes    Attends Engineer, structural: More than 4 times per year  Marital Status: Married  Catering manager Violence: Not At Risk (11/03/2021)   Humiliation, Afraid, Rape, and Kick questionnaire    Fear of Current or Ex-Partner: No    Emotionally  Abused: No    Physically Abused: No    Sexually Abused: No   Review of systems: Review of Systems  Constitutional:  Negative for unexpected weight change.  HENT:  Negative for trouble swallowing.   Gastrointestinal:  Positive for constipation. Negative for abdominal distention, abdominal pain, anal bleeding, blood in stool, diarrhea, nausea, rectal pain and vomiting.  Genitourinary:  Positive for urgency.    Physical Exam: General: well-appearing   Eyes: sclera anicteric, no redness ENT: oral mucosa moist without lesions, no cervical or supraclavicular lymphadenopathy CV: RRR, no JVD, no peripheral edema Resp: clear to auscultation bilaterally, normal RR and effort noted GI: soft, no tenderness, with active bowel sounds. No guarding or palpable organomegaly noted. Skin; warm and dry, no rash or jaundice noted Neuro: awake, alert and oriented x 3. Normal gross motor function and fluent speech   Data Reviewed:  Reviewed labs, radiology imaging, old records and pertinent past GI work up   Assessment and Plan/Recommendations:  72 year old very pleasant female with history of Parkinson's disease, rheumatoid arthritis with dysphagia, GERD, worsening IBS constipation and gastroparesis  IBS constipation: No improvement with MiraLAX, Linzess and Trulance Will switch to Tenapanor (Ibserla) 50 mg twice daily, patient was provided samples, advised her to start taking it once daily on empty stomach before breakfast and if she is tolerating it well increase it to twice daily   Dyssynergic defecation and pelvic floor dysfunction: Refer to pelvic floor physical therapy for biofeedback  Gastroparesis: Avoid high-fiber diet and high-fiber raw vegetables Advised patient to add back cooked vegetables and fruit into her diet, small frequent meals Increase water intake  She is considering possible implantable sustainable release pump for Parkinson's, encourage patient to discuss it further with  her neurologist so she gets sustained medication, is unclear if she is absorbing all her meds given underlying gastroparesis    Return in 3 months  This visit required >40 minutes of patient care (this includes precharting, chart review, review of results, face-to-face time used for counseling as well as treatment plan and follow-up. The patient was provided an opportunity to ask questions and all were answered. The patient agreed with the plan and demonstrated an understanding of the instructions.  Iona Beard , MD    CC: Shelva Majestic, MD   Ladona Mow Hewitt Shorts as a scribe for Marsa Aris, MD.,have documented all relevant documentation on the behalf of Marsa Aris, MD,as directed by  Marsa Aris, MD while in the presence of Marsa Aris, MD.   I, Marsa Aris, MD, have reviewed all documentation for this visit. The documentation on 12/26/22 for the exam, diagnosis, procedures, and orders are all accurate and complete.

## 2022-12-25 ENCOUNTER — Ambulatory Visit: Payer: Medicare Other | Admitting: Behavioral Health

## 2022-12-26 ENCOUNTER — Encounter: Payer: Self-pay | Admitting: Gastroenterology

## 2022-12-26 ENCOUNTER — Ambulatory Visit (INDEPENDENT_AMBULATORY_CARE_PROVIDER_SITE_OTHER): Payer: Medicare Other | Admitting: Gastroenterology

## 2022-12-26 VITALS — BP 132/74 | HR 71 | Ht 63.0 in | Wt 235.0 lb

## 2022-12-26 DIAGNOSIS — K5902 Outlet dysfunction constipation: Secondary | ICD-10-CM

## 2022-12-26 DIAGNOSIS — M6289 Other specified disorders of muscle: Secondary | ICD-10-CM

## 2022-12-26 DIAGNOSIS — K581 Irritable bowel syndrome with constipation: Secondary | ICD-10-CM

## 2022-12-26 DIAGNOSIS — G20A1 Parkinson's disease without dyskinesia, without mention of fluctuations: Secondary | ICD-10-CM | POA: Diagnosis not present

## 2022-12-26 DIAGNOSIS — K3184 Gastroparesis: Secondary | ICD-10-CM | POA: Diagnosis not present

## 2022-12-26 NOTE — Patient Instructions (Addendum)
AVOID Raw vegetables and high fat diet / Eat small frequent meals  Increase water intake 8-10 cups per day  Use Ibsrela 50 mg daily for 1 week.Samples given to you today    See how they work for you if they work then increase to twice a day before breakfast and dinner on an empty stomach  STOP Trulance if you are taking the Allena Napoleon   We will refer you for PT Therapy with [facility]  _______________________________________________________  If your blood pressure at your visit was 140/90 or greater, please contact your primary care physician to follow up on this.  _______________________________________________________  If you are age 65 or older, your body mass index should be between 23-30. Your Body mass index is 41.63 kg/m. If this is out of the aforementioned range listed, please consider follow up with your Primary Care Provider.  If you are age 52 or younger, your body mass index should be between 19-25. Your Body mass index is 41.63 kg/m. If this is out of the aformentioned range listed, please consider follow up with your Primary Care Provider.   ________________________________________________________  The [facility] providers would like to encourage you to use [facility] to communicate with providers for non-urgent requests or questions.  Due to long hold times on the telephone, sending your provider a message by [facility] may be a faster and more efficient way to get a response.  Please allow 48 business hours for a response.  Please remember that this is for non-urgent requests.  _______________________________________________________   I appreciate the  opportunity to care for you  Thank You   Marsa Aris , MD

## 2022-12-27 ENCOUNTER — Telehealth: Payer: Self-pay | Admitting: *Deleted

## 2022-12-27 NOTE — Telephone Encounter (Signed)
Faxed referral today to [facility] for PT

## 2023-01-01 ENCOUNTER — Other Ambulatory Visit: Payer: Self-pay | Admitting: Family Medicine

## 2023-01-01 DIAGNOSIS — Z1231 Encounter for screening mammogram for malignant neoplasm of breast: Secondary | ICD-10-CM

## 2023-01-05 DIAGNOSIS — Z85828 Personal history of other malignant neoplasm of skin: Secondary | ICD-10-CM | POA: Diagnosis not present

## 2023-01-05 DIAGNOSIS — C44321 Squamous cell carcinoma of skin of nose: Secondary | ICD-10-CM | POA: Diagnosis not present

## 2023-01-05 DIAGNOSIS — L570 Actinic keratosis: Secondary | ICD-10-CM | POA: Diagnosis not present

## 2023-01-08 ENCOUNTER — Ambulatory Visit: Payer: Medicare Other | Admitting: Behavioral Health

## 2023-01-22 ENCOUNTER — Ambulatory Visit: Payer: Medicare Other | Admitting: Behavioral Health

## 2023-01-23 ENCOUNTER — Encounter: Payer: Self-pay | Admitting: Family Medicine

## 2023-01-23 ENCOUNTER — Ambulatory Visit (INDEPENDENT_AMBULATORY_CARE_PROVIDER_SITE_OTHER): Payer: Medicare Other | Admitting: Family Medicine

## 2023-01-23 VITALS — BP 138/78 | HR 75 | Temp 97.9°F | Ht 63.0 in | Wt 235.8 lb

## 2023-01-23 DIAGNOSIS — S90421A Blister (nonthermal), right great toe, initial encounter: Secondary | ICD-10-CM

## 2023-01-23 DIAGNOSIS — G20B2 Parkinson's disease with dyskinesia, with fluctuations: Secondary | ICD-10-CM | POA: Diagnosis not present

## 2023-01-23 DIAGNOSIS — R4589 Other symptoms and signs involving emotional state: Secondary | ICD-10-CM | POA: Diagnosis not present

## 2023-01-23 NOTE — Progress Notes (Signed)
Subjective  CC:  Chief Complaint  Patient presents with   Blister    Pt has blisters on her Rt foot since 01/21/2023   Same day acute visit; PCP not available. New pt to me. Chart reviewed.   HPI: Kristin [name] is a 72 y.o. female who presents to the office today to address the problems listed above in the chief complaint. Very pleasant 72 year old female noticed a blister on her great right toe, dorsal 2 days ago.  She was wearing a pair of shoes where it was rubbing the great toe over the weekend.  This morning blister popped.  No pain or bleeding now.  Unfortunately, this caused her to be quite concerned and anxious.  She had a slight panic attack.  This triggered an emotional response due to her father's medical problems and history of amputations. During the conversation, patient became quite upset.  Discussed recent stressors including a family vacation that brought up a lot of childhood memories.  She has been overtired and a bit dysregulated.  She is on mood medications.  Assessment  1. Blister of great toe of right foot, initial encounter   2. Parkinson's disease with dyskinesia and fluctuating manifestations   3. Emotional dysregulation      Plan  Blister: Routine skin care and reassured Emotional dysregulation in the setting of Parkinson's: Had long conversation and discussion.  Counseling done regarding triggers from childhood memories during recent family vacation.  Recommend improving sleep, once rested to carefully think about how she is doing and how she was doing and to seek therapy if she feels it could be beneficial.  She may very well do following if getting back to her normal compensatory behaviors.  Patient was appreciated.  Stable upon discharge. I spent a total of 31 minutes for this patient encounter. Time spent included preparation, face-to-face counseling with the patient and coordination of care, review of chart and records, and documentation of the  encounter.  Follow up: As needed 03/28/2023  No orders of the defined types were placed in this encounter.  No orders of the defined types were placed in this encounter.     I reviewed the patients updated PMH, FH, and SocHx.    Patient Active Problem List   Diagnosis Date Noted   Vitamin B 12 deficiency 05/30/2021   Anemia 03/16/2020   Coronary artery disease involving native coronary artery of native heart without angina pectoris 06/12/2019   Diastolic dysfunction 05/16/2019   Rheumatoid arthritis (HCC) 01/08/2018   Hand pain, right 10/15/2017   Vitamin D deficiency 12/20/2016   Prediabetes 12/20/2016   Shortness of breath on exertion 11/27/2016   Morbid obesity (HCC)    Constipation 12/07/2015   BCC (basal cell carcinoma of skin) 10/05/2014   Rosacea 10/05/2014   Parkinson's disease (HCC) 05/11/2014   Screening for cervical cancer 07/17/2012   Foot pain, bilateral 06/19/2012   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   carbidopa-levodopa (SINEMET CR) 50-200 MG tablet TAKE 1 TABLET BY MOUTH EVERYDAY AT BEDTIME   carbidopa-levodopa (SINEMET IR) 25-100  MG tablet TAKE 2 TABLETS BY MOUTH AT 7AM, 2TABLETS AT 11AM, 1 TABLET AT 4PM   carvedilol (COREG) 3.125 MG tablet TAKE 1 TABLET BY MOUTH TWICE A DAY WITH MEALS   DULoxetine (CYMBALTA) 30 MG capsule TAKE 1 CAPSULE BY MOUTH EVERY DAY   escitalopram (LEXAPRO) 10 MG tablet Take 1 tablet (10 mg total) by mouth daily.   folic acid (FOLVITE) 1 MG tablet Take 1 mg by mouth 2 (two) times  daily.   losartan (COZAAR) 50 MG tablet TAKE 1 TABLET BY MOUTH EVERY DAY   omeprazole (PRILOSEC) 20 MG capsule TAKE 1 CAPSULE BY MOUTH 2 TIMES DAILY AS NEEDED.   polyethylene glycol (MIRALAX / GLYCOLAX) 17 g packet Take 17 g by mouth daily.   pramipexole (MIRAPEX) 0.5 MG tablet TAKE 1 TABLET BY MOUTH 3 TIMES DAILY.   rosuvastatin (CRESTOR) 10 MG tablet TAKE 1 TABLET BY MOUTH ON MONDAYS,WEDNESDAYS, AND FRIDAYS (Patient taking differently: TAKE 1 TABLET BY MOUTH pt takes on Sundays, Tuesdays and Thursdays)   TRULANCE 3 MG TABS TAKE 1 TABLET BY MOUTH DAILY   Vitamin D, Ergocalciferol, (DRISDOL) 1.25 MG (50000 UNIT) CAPS capsule Take 1 capsule (50,000 Units total) by mouth every 7 (seven) days.    Allergies: Patient is allergic to scopolamine, prednisone, bactrim [sulfamethoxazole-trimethoprim], penicillins, and sulfa antibiotics. Family History: Patient family history includes Arthritis in her father, sister, sister, and sister; Bipolar disorder in her mother; Breast cancer in her maternal uncle; Breast cancer (age of onset: 49) in her maternal aunt; Breast cancer (age of onset: 60) in her mother; Breast cancer (age of onset: 33) in her cousin; Depression in her mother and sister; Heart attack in her paternal grandfather and paternal uncle; Heart disease in her father; Hyperlipidemia in her father and mother; Hypertension in her father; Mental illness in her mother and sister; Other in her mother; Parkinson's disease in her sister; Thyroid disease in her mother. Social History:  Patient  reports that she has never smoked. She has never used smokeless tobacco. She reports current alcohol use. She reports that she does not use drugs.  Review of Systems: Constitutional: Negative for fever malaise or anorexia Cardiovascular: negative for chest pain Respiratory: negative for SOB or persistent cough Gastrointestinal: negative for abdominal pain  Objective  Vitals: BP 138/78   Pulse 75   Temp 97.9 F  (36.6 C)   Ht 5\' 3"  (1.6 m)   Wt 235 lb 12.8 oz (107 kg)   SpO2 97%   BMI 41.77 kg/m  Psych: A&Ox3, tearful, anxious Neuro: Resting tremors present Skin:  Warm, no rashes, dorsum of right great toe with small ulceration.  No bleeding  Commons side effects, risks, benefits, and alternatives for medications and treatment plan prescribed today were discussed, and the patient expressed understanding of the given instructions. Patient is instructed to call or message via MyChart if he/she has any questions or concerns regarding our treatment plan. No barriers to understanding were identified. We discussed Red Flag symptoms and signs in detail. Patient expressed understanding regarding what to do in case of urgent or emergency type symptoms.  Medication list was reconciled, printed and provided to the patient in AVS. Patient instructions and summary information was reviewed with the patient as documented in the AVS. This note was prepared with assistance of Dragon voice recognition software. Occasional wrong-word or sound-a-like substitutions may have occurred due to the inherent limitations of voice recognition software

## 2023-01-23 NOTE — Telephone Encounter (Signed)
Called patient to make sure [facility] has been in touch with her about an appointment for Pelvic Floor Dysfunction  She has appointments starting [facility] June with [facility]   She wanted Dr Lavon Paganini to know she has not started the Ibsrela yet, samples given at last office appointment. She will start them next week.

## 2023-01-24 NOTE — Telephone Encounter (Signed)
Ok, thanks.

## 2023-01-30 ENCOUNTER — Other Ambulatory Visit: Payer: Self-pay | Admitting: Family Medicine

## 2023-01-30 ENCOUNTER — Other Ambulatory Visit: Payer: Self-pay | Admitting: Neurology

## 2023-01-30 DIAGNOSIS — G20A1 Parkinson's disease without dyskinesia, without mention of fluctuations: Secondary | ICD-10-CM

## 2023-02-02 ENCOUNTER — Ambulatory Visit
Admission: RE | Admit: 2023-02-02 | Discharge: 2023-02-02 | Disposition: A | Discharge status: Home / Self Care | Payer: Medicare Other | Source: Ambulatory Visit | Attending: Family Medicine | Admitting: Family Medicine

## 2023-02-02 DIAGNOSIS — Z1231 Encounter for screening mammogram for malignant neoplasm of breast: Secondary | ICD-10-CM

## 2023-02-12 ENCOUNTER — Other Ambulatory Visit: Payer: Self-pay | Admitting: Gastroenterology

## 2023-02-12 DIAGNOSIS — Z85828 Personal history of other malignant neoplasm of skin: Secondary | ICD-10-CM | POA: Diagnosis not present

## 2023-02-12 DIAGNOSIS — C44321 Squamous cell carcinoma of skin of nose: Secondary | ICD-10-CM | POA: Diagnosis not present

## 2023-02-15 ENCOUNTER — Ambulatory Visit: Payer: Medicare Other | Admitting: Occupational Therapy

## 2023-02-15 ENCOUNTER — Other Ambulatory Visit: Payer: Self-pay

## 2023-02-15 ENCOUNTER — Ambulatory Visit: Payer: Medicare Other

## 2023-02-15 ENCOUNTER — Telehealth: Payer: Self-pay | Admitting: Physical Therapy

## 2023-02-15 ENCOUNTER — Ambulatory Visit: Payer: Medicare Other | Attending: Neurology | Admitting: Physical Therapy

## 2023-02-15 DIAGNOSIS — G20A1 Parkinson's disease without dyskinesia, without mention of fluctuations: Secondary | ICD-10-CM

## 2023-02-15 DIAGNOSIS — R2689 Other abnormalities of gait and mobility: Secondary | ICD-10-CM | POA: Insufficient documentation

## 2023-02-15 NOTE — Telephone Encounter (Signed)
Referral sent to [address]

## 2023-02-15 NOTE — Telephone Encounter (Signed)
Ivyanna Sibert was seen for PT screens in our multi-disciplinary clinic.  Physical therapy is recommended for follow up due to slowed mobility measures and reported increased fear of falling.  If you agree, please send referral via Epic for physical therapy eval and treat.  Thank you.   Lonia Blood, PT 02/15/23 12:40 PM Phone: [phone] Fax: [phone]  [facility] at [facility] [address] [place], [address] [place], [place] [address] Phone # [phone] Fax # [phone]

## 2023-02-15 NOTE — Therapy (Signed)
[facility] [address], [address] [place], [place], [address] Phone: [phone]   Fax:  [phone]  Patient Details  Name: Kristin [name] MRN: [id number] Date of Birth: 1951-07-29 Referring Provider:  Shelva Majestic, MD  Encounter Date: 02/15/2023  Physical Therapy Parkinson's Disease Screen  06/15/2022:  TUG 10.63, cog 12.5, 22/28, 12.03, 2.7 ft/sec  Timed Up and Go test:15.41 sec (compared to 10.63 sec)  10 meter walk test: 2.1 ft/sec  5 time sit to stand test:12.34 sec with initial hesitation (12.03 sec)  Patient would benefit from Physical Therapy evaluation due to increased fear of falling, slowed mobility measures   Reports nervous about falling, like I may fall, but have not fallen.  Pt would like to focus only on PT at this time.  If you answer "Yes" to the following, you may benefit from occupational therapy.  Do you have difficulty cutting your food or do you spill food off your spoon or fork when eating? Yes___   No__X_          Do you have difficulty brushing your teeth, brushing your hair, or shaving? Yes___   No__X_          Do you have difficulty getting dressed (fastening buttons, tying your shoes, putting on your socks, pulling a shirt down in the back)? Yes__X_   No___          Do you have difficulty getting in or out of the shower? Yes___   No__X_          Does your writing get small or is it hard to read?  Does exercises Yes__X_   No___           Do you have increased difficulty with cooking, cleaning, or shopping?  (reaching for items in cabinets, carrying pots and pans, carrying laundry basket, carrying groceries, etc.)  Yes___   No__X_          Do you have shoulder pain?  Yes___   No_X__           Is it taking you longer to get ready for the day than it did 6 months ago? Yes___   No__X_  If you answer "Yes" to the following, you may benefit from physical therapy. (SEE SCREEN)  Do you  have falls?     Yes___   No___           Do you feel unsteady while walking or notice changes in your balance?       Yes___   No___           Do you have difficulty getting started walking?   Yes___   No___           Do you experience "freezing" where you feel like your feet are stuck to the ground?     Yes___   No___            If you answer "Yes" to the following, you may benefit from speech therapy.    Do others tell you that they have difficulty hearing you?      Yes___   No__X_          Do you have difficulty swallowing food and/or liquids?         Nervous about dry meat, so doesn't eat    Yes__X_   No___          Do you have excess saliva?  Yes___   No__X_           During meals, do you cough or clear throat excessively?       Yes___   No__X_            Gean Maidens., PT 02/15/2023, 7:54 AM  [place]  [facility] [address], [address] [place], [place], [address] Phone: [phone]   Fax:  [phone]

## 2023-03-05 ENCOUNTER — Ambulatory Visit (INDEPENDENT_AMBULATORY_CARE_PROVIDER_SITE_OTHER): Payer: Medicare Other | Admitting: Podiatry

## 2023-03-05 ENCOUNTER — Encounter: Payer: Self-pay | Admitting: Podiatry

## 2023-03-05 DIAGNOSIS — M79674 Pain in right toe(s): Secondary | ICD-10-CM | POA: Diagnosis not present

## 2023-03-05 DIAGNOSIS — G20B2 Parkinson's disease with dyskinesia, with fluctuations: Secondary | ICD-10-CM

## 2023-03-05 DIAGNOSIS — B351 Tinea unguium: Secondary | ICD-10-CM

## 2023-03-05 DIAGNOSIS — M79675 Pain in left toe(s): Secondary | ICD-10-CM | POA: Diagnosis not present

## 2023-03-05 DIAGNOSIS — M069 Rheumatoid arthritis, unspecified: Secondary | ICD-10-CM | POA: Diagnosis not present

## 2023-03-05 NOTE — Progress Notes (Signed)
  Subjective:  Patient ID: Kristin [name], female    DOB: 1950/12/07,   MRN: [id number]  Chief Complaint  Patient presents with   Nail Problem     Routine foot care    72 y.o. female presents for concern of thickened elongated and painful nails that are difficult to trim. She has difficulty trimming and worried about her feet. Has history of RA and parkinson.   PCP:  Shelva Majestic, MD    . Denies any other pedal complaints. Denies n/v/f/c.   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 CFT <3 seconds. Normal hair growth on digits. No edema.  Skin. No lacerations or abrasions bilateral feet. Nails 1-5 bilateral are normal in appearance but elongated Musculoskeletal: MMT 5/5 bilateral lower extremities in DF, PF, Inversion and Eversion. Deceased ROM in DF of ankle joint.  Neurological: Sensation intact to light touch. Protective  sensation mildly diminished.   Assessment:   1. Pain due to onychomycosis of toenails of both feet   2. Parkinson's disease with dyskinesia and fluctuating manifestations   3. Rheumatoid arthritis, involving unspecified site, unspecified whether rheumatoid factor present [facility])      Plan:  Patient was evaluated and treated and all questions answered. -Discussed and educated patient on diabetic foot care, especially with  regards to the vascular, neurological and musculoskeletal systems.  -Mechanically debrided all nails 1-5 bilateral using sterile nail nipper and filed with dremel without incident as courtesy -Answered all patient questions -Patient to return as needed for future care -Patient advised to call the office if any problems or questions arise in the meantime.    Louann Sjogren, DPM

## 2023-03-06 ENCOUNTER — Ambulatory Visit (INDEPENDENT_AMBULATORY_CARE_PROVIDER_SITE_OTHER): Payer: Medicare Other

## 2023-03-06 ENCOUNTER — Ambulatory Visit: Payer: Medicare Other | Attending: Neurology | Admitting: Physical Therapy

## 2023-03-06 ENCOUNTER — Other Ambulatory Visit: Payer: Self-pay

## 2023-03-06 ENCOUNTER — Encounter: Payer: Self-pay | Admitting: Physical Therapy

## 2023-03-06 VITALS — Wt 235.0 lb

## 2023-03-06 DIAGNOSIS — R293 Abnormal posture: Secondary | ICD-10-CM | POA: Diagnosis not present

## 2023-03-06 DIAGNOSIS — R2689 Other abnormalities of gait and mobility: Secondary | ICD-10-CM | POA: Diagnosis not present

## 2023-03-06 DIAGNOSIS — G20A1 Parkinson's disease without dyskinesia, without mention of fluctuations: Secondary | ICD-10-CM | POA: Diagnosis not present

## 2023-03-06 DIAGNOSIS — R29818 Other symptoms and signs involving the nervous system: Secondary | ICD-10-CM | POA: Diagnosis not present

## 2023-03-06 DIAGNOSIS — Z0000 Encounter for general adult medical examination without abnormal findings: Secondary | ICD-10-CM

## 2023-03-06 DIAGNOSIS — M6281 Muscle weakness (generalized): Secondary | ICD-10-CM | POA: Insufficient documentation

## 2023-03-06 DIAGNOSIS — R2681 Unsteadiness on feet: Secondary | ICD-10-CM | POA: Diagnosis not present

## 2023-03-06 NOTE — Progress Notes (Signed)
Subjective:   Kristin [name] is a 72 y.o. female who presents for Medicare Annual (Subsequent) preventive examination.  Visit Complete: Virtual  I connected with  Kristin [name] on 03/06/23 by a audio enabled telemedicine application and verified that I am speaking with the correct person using two identifiers.  Patient Location: Home  Provider Location: Office/Clinic  I discussed the limitations of evaluation and management by telemedicine. The patient expressed understanding and agreed to proceed.  Patient Medicare AWV questionnaire was completed by the patient on 03/05/23; I have confirmed that all information answered by patient is correct and no changes since this date.  Review of Systems     Cardiac Risk Factors include: advanced age (>89men, >28 women);dyslipidemia;hypertension;obesity (BMI >30kg/m2)     Objective:    Today's Vitals   03/06/23 1109  Weight: 235 lb (106.6 kg)   Body mass index is 41.63 kg/m.     03/06/2023   11:15 AM 03/06/2023    8:53 AM 10/17/2022    9:06 AM 08/22/2022    8:52 AM 08/22/2022    8:11 AM 05/15/2022   11:06 AM 04/13/2022    2:22 PM  Advanced Directives  Does Patient Have a Medical Advance Directive? Yes Yes Yes Yes Yes Yes Yes  Type of Estate agent of [place];Living will Healthcare Power of [place];Living will Living will Healthcare Power of [place];Living will Healthcare Power of [place];Living will Healthcare Power of [place];Living will Healthcare Power of [place];Living will  Does patient want to make changes to medical advance directive? No - Patient declined        Copy of Healthcare Power of Attorney in Chart? Yes - validated most recent copy scanned in chart (See row information)          Current Medications (verified) Outpatient Encounter Medications as of 03/06/2023  Medication Sig   ALPRAZolam (XANAX) 0.25 MG tablet TAKE 1 TABLET BY MOUTH 2 TIMES DAILY AS NEEDED FOR ANXIETY.   carbidopa-levodopa  (SINEMET CR) 50-200 MG tablet TAKE 1 TABLET BY MOUTH EVERYDAY AT BEDTIME   carbidopa-levodopa (SINEMET IR) 25-100 MG tablet TAKE 2 TABLETS BY MOUTH AT 7AM, 2TABLETS AT 11AM, 1 TABLET AT 4PM   carvedilol (COREG) 3.125 MG tablet TAKE 1 TABLET BY MOUTH TWICE A DAY WITH MEALS   DULoxetine (CYMBALTA) 30 MG capsule TAKE 1 CAPSULE BY MOUTH EVERY DAY   escitalopram (LEXAPRO) 10 MG tablet Take 1 tablet (10 mg total) by mouth daily.   folic acid (FOLVITE) 1 MG tablet Take 1 mg by mouth 2 (two) times daily.   losartan (COZAAR) 50 MG tablet TAKE 1 TABLET BY MOUTH EVERY DAY   omeprazole (PRILOSEC) 20 MG capsule TAKE 1 CAPSULE BY MOUTH 2 TIMES DAILY AS NEEDED.   polyethylene glycol (MIRALAX / GLYCOLAX) 17 g packet Take 17 g by mouth daily.   pramipexole (MIRAPEX) 0.5 MG tablet TAKE 1 TABLET BY MOUTH 3 TIMES DAILY.   rosuvastatin (CRESTOR) 10 MG tablet TAKE 1 TABLET BY MOUTH ON MONDAYS,WEDNESDAYS, AND FRIDAYS (Patient taking differently: TAKE 1 TABLET BY MOUTH pt takes on Sundays, Tuesdays and Thursdays)   TRULANCE 3 MG TABS TAKE 1 TABLET BY MOUTH EVERY DAY   Vitamin D, Ergocalciferol, (DRISDOL) 1.25 MG (50000 UNIT) CAPS capsule TAKE 1 CAPSULE (50,000 UNITS TOTAL) BY MOUTH EVERY 7 (SEVEN) DAYS   No facility-administered encounter medications on file as of 03/06/2023.    Allergies (verified) Scopolamine, Prednisone, Bactrim [sulfamethoxazole-trimethoprim], Penicillins, and Sulfa antibiotics   History: Past Medical History:  Diagnosis Date   Abnormal vaginal Pap smear    Acute pharyngitis 02/25/2013   Anemia    Anxiety    Arthritis    BCC (basal cell carcinoma of skin) 10/05/2014   On back   Chicken pox as a child   Chronic UTI    sees dr Vonita Moss   Constipation 12/07/2015   Depression with anxiety 11/02/2009   Qualifier: Diagnosis of  By: Mayford Knife, LPN, Bonnye M    Dermatitis 07/17/2012   Esophageal stricture 1994   Fibroids    Foot pain, bilateral 06/19/2012   GERD (gastroesophageal reflux  disease)    Hiatal hernia    Hyperglycemia 08/19/2013   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   Hyperlipidemia    Hypertension    Infertility, female    Low back pain 10/17/2007   Qualifier: Diagnosis of  By: Lovell Sheehan MD, [name]    Measles as a child   Obesity    Osteoarthritis    Parkinson disease    Plantar fasciitis of left foot 06/19/2012   Preventative health care 12/19/2015   Rheumatoid arthritis (HCC) 01/08/2018   Rosacea 10/05/2014   Swallowing difficulty    Urinary frequency 02/25/2013   Visual floaters 05/11/2014   Past Surgical History:  Procedure Laterality Date   ABDOMINAL HYSTERECTOMY  2006   total   esophageal     stretching   HERNIA REPAIR N/A 2022   hiatal hernia   laporoscopy     LEFT HEART CATH AND CORONARY ANGIOGRAPHY N/A 06/02/2019   Procedure: LEFT HEART CATH AND CORONARY ANGIOGRAPHY;  Surgeon: Corky Crafts, MD;  Location: [facility];  Service: Cardiovascular;  Laterality: N/A;   TONSILLECTOMY     TOTAL HIP ARTHROPLASTY Right 2010   UPPER GASTROINTESTINAL ENDOSCOPY     wisdom teeth extracted     Family History  Problem Relation Age of Onset   Other Mother        arrythmia   Mental illness Mother        bipolar   Hyperlipidemia Mother    Thyroid disease Mother    Depression Mother    Bipolar disorder Mother    Breast cancer Mother 13   Heart disease Father    Arthritis Father        rheumatoid   Hypertension Father    Hyperlipidemia Father    Depression Sister    Mental illness Sister        bipolar   Parkinson's disease Sister    Arthritis Sister    Arthritis Sister    Arthritis Sister    Breast cancer Maternal Aunt 35   Breast cancer Maternal Uncle        17s   Heart attack Paternal Uncle    Heart attack Paternal Grandfather    Breast cancer Cousin 67   Colon cancer Neg Hx    Esophageal cancer Neg Hx    Rectal cancer Neg Hx    Stomach cancer Neg Hx    Social History   Socioeconomic History   Marital status:  Married    Spouse name: Not on file   Number of children: 1   Years of education: Not on file   Highest education level: Master's degree (e.g., MA, MS, MEng, MEd, MSW, MBA)  Occupational History   Occupation: Retired International aid/development worker  Tobacco Use   Smoking status: Never   Smokeless tobacco: Never  Vaping Use   Vaping Use: Never used  Substance and Sexual Activity   Alcohol  use: Yes    Comment: occasional wine   Drug use: No   Sexual activity: Not Currently    Partners: Male  Other Topics Concern   Not on file  Social History Narrative   Lives with husband, 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 3 grandsons- 2 in [place] and 33 year old in [place] in 2023      Hobbies: book club, time with friends, time with couples friends, plays pianos   Social Determinants of Health   Financial Resource Strain: Low Risk  (03/05/2023)   Overall Financial Resource Strain (CARDIA)    Difficulty of Paying Living Expenses: Not hard at all  Food Insecurity: No Food Insecurity (03/05/2023)   Hunger Vital Sign    Worried About Running Out of Food in the Last Year: Never true    Ran Out of Food in the Last Year: Never true  Transportation Needs: No Transportation Needs (03/05/2023)   PRAPARE - Administrator, Civil Service (Medical): No    Lack of Transportation (Non-Medical): No  Physical Activity: Sufficiently Active (03/05/2023)   Exercise Vital Sign    Days of Exercise per Week: 4 days    Minutes of Exercise per Session: 40 min  Stress: Stress Concern Present (03/05/2023)   [employer] of Occupational Health - Occupational Stress Questionnaire    Feeling of Stress : To some extent  Social Connections: Socially Integrated (03/05/2023)   Social Connection and Isolation Panel [NHANES]    Frequency of Communication with Friends and Family: More than three times a week    Frequency of Social  Gatherings with Friends and Family: Three times a week    Attends Religious Services: 1 to 4 times per year    Active Member of Clubs or Organizations: Yes    Attends Engineer, structural: More than 4 times per year    Marital Status: Married    Tobacco Counseling Counseling given: Not Answered   Clinical Intake:  Pre-visit preparation completed: Yes  Pain : No/denies pain     BMI - recorded: 41.63 Nutritional Status: BMI > 30  Obese Diabetes: No  How often do you need to have someone help you when you read instructions, pamphlets, or other written materials from your doctor or pharmacy?: 1 - Never  Interpreter Needed?: No  Information entered by :: Lanier Ensign, LPN   Activities of Daily Living    03/05/2023   12:05 PM  In your present state of health, do you have any difficulty performing the following activities:  Hearing? 0  Vision? 0  Difficulty concentrating or making decisions? 0  Walking or climbing stairs? 1  Dressing or bathing? 0  Doing errands, shopping? 0  Preparing Food and eating ? N  Using the Toilet? N  In the past six months, have you accidently leaked urine? Y  Comment wears a brief  Do you have problems with loss of bowel control? N  Managing your Medications? N  Managing your Finances? N  Housekeeping or managing your Housekeeping? N    Patient Care Team: Shelva Majestic, MD as PCP - General (Family Medicine) Thomasene Ripple, DO as PCP - Cardiology (Cardiology) Tat, Octaviano Batty, DO as Consulting Physician (Neurology) Tat, Octaviano Batty, DO as Consulting Physician (Neurology)  Indicate any recent Medical Services you may have received from other than [facility] providers in the past year (date  may be approximate).     Assessment:   This is a routine wellness examination for [facility].  Hearing/Vision screen Hearing Screening - Comments:: Pt denies any hearing issues  Vision Screening - Comments:: Pt follows up with Dr Randon Goldsmith for annual eye  exams   Dietary issues and exercise activities discussed:     Goals Addressed   None    Depression Screen    03/06/2023   11:13 AM 01/23/2023   11:07 AM 09/27/2022    8:07 AM 01/12/2022   11:56 AM 11/03/2021    1:11 PM 10/03/2021   11:07 AM 03/10/2021    4:06 PM  PHQ 2/9 Scores  PHQ - 2 Score 0 0 0 0 0 2 0  PHQ- 9 Score 0 0 0 4  13     Fall Risk    03/05/2023   12:05 PM 01/23/2023   11:06 AM 10/17/2022    9:06 AM 04/13/2022    2:21 PM 01/12/2022   11:55 AM  Fall Risk   Falls in the past year? 0 0 0 0 0  Number falls in past yr: 0 0 0 0 0  Injury with Fall? 0 0 0 0 0  Risk for fall due to : Impaired vision;Impaired balance/gait;Impaired mobility No Fall Risks   Impaired mobility;Impaired balance/gait  Follow up Falls prevention discussed Falls evaluation completed   Falls evaluation completed    MEDICARE RISK AT HOME:   TIMED UP AND GO:  Was the test performed?  No    Cognitive Function:    10/15/2017   10:41 AM  MMSE - Mini Mental State Exam  Orientation to time 5  Orientation to Place 5  Registration 3  Attention/ Calculation 5  Recall 3  Language- name 2 objects 2  Language- repeat 1  Language- follow 3 step command 3  Language- read & follow direction 1  Write a sentence 1  Copy design 1  Total score 30        03/06/2023   11:17 AM  6CIT Screen  What Year? 0 points  What month? 0 points  What time? 0 points  Count back from 20 0 points  Months in reverse 0 points  Repeat phrase 0 points  Total Score 0 points    Immunizations Immunization History  Administered Date(s) Administered   Fluad Quad(high Dose 65+) 05/30/2021   Influenza Split 06/19/2012   Influenza Whole 07/05/2007, 07/08/2009   Influenza, High Dose Seasonal PF 05/04/2016, 05/10/2017, 05/07/2018, 04/29/2019, 06/17/2020, 06/18/2022   Influenza,inj,Quad PF,6+ Mos 07/10/2013, 05/05/2014   PFIZER Comirnaty(Gray Top)Covid-19 Tri-Sucrose Vaccine 10/07/2019, 10/30/2019, 01/11/2021    PFIZER(Purple Top)SARS-COV-2 Vaccination 10/07/2019, 10/30/2019, 05/05/2020   Pfizer Covid-19 Vaccine Bivalent Booster 76yrs & up 06/15/2021   Pneumococcal Conjugate-13 07/10/2013   Pneumococcal Polysaccharide-23 12/07/2015   Td 09/04/2004   Tdap 01/04/2015   Zoster Recombinant(Shingrix) 12/20/2017, 03/11/2018    TDAP status: Up to date  Flu Vaccine status: Up to date  Pneumococcal vaccine status: Up to date  Covid-19 vaccine status: Completed vaccines  Qualifies for Shingles Vaccine? Yes   Zostavax completed Yes   Shingrix Completed?: Yes  Screening Tests Health Maintenance  Topic Date Due   COVID-19 Vaccine (8 - 2023-24 season) 05/05/2022   INFLUENZA VACCINE  04/05/2023   Medicare Annual Wellness (AWV)  03/05/2024   DTaP/Tdap/Td (3 - Td or Tdap) 01/03/2025   MAMMOGRAM  02/01/2025   Pneumonia Vaccine 92+ Years old  Completed   DEXA SCAN  Completed   Hepatitis C  Screening  Completed   Zoster Vaccines- Shingrix  Completed   HPV VACCINES  Aged Out   Colonoscopy  Discontinued    Health Maintenance  Health Maintenance Due  Topic Date Due   COVID-19 Vaccine (8 - 2023-24 season) 05/05/2022    Colorectal cancer screening: Type of screening: Colonoscopy. Completed 06/07/20. Repeat every as directed  years  Mammogram status: Completed 02/02/23. Repeat every year  Bone Density status: Completed 04/04/22. Results reflect: Bone density results: NORMAL. Repeat every 2 years.  Additional Screening:  Hepatitis C Screening:  Completed 12/07/15  Vision Screening: Recommended annual ophthalmology exams for early detection of glaucoma and other disorders of the eye. Is the patient up to date with their annual eye exam?  Yes  Who is the provider or what is the name of the office in which the patient attends annual eye exams? Dr  Randon Goldsmith If pt is not established with a provider, would they like to be referred to a provider to establish care? No .   Dental Screening: Recommended annual  dental exams for proper oral hygiene   Community Resource Referral / Chronic Care Management: CRR required this visit?  No   CCM required this visit?  No     Plan:     I have personally reviewed and noted the following in the patient's chart:   Medical and social history Use of alcohol, tobacco or illicit drugs  Current medications and supplements including opioid prescriptions. Patient is not currently taking opioid prescriptions. Functional ability and status Nutritional status Physical activity Advanced directives List of other physicians Hospitalizations, surgeries, and ER visits in previous 12 months Vitals Screenings to include cognitive, depression, and falls Referrals and appointments  In addition, I have reviewed and discussed with patient certain preventive protocols, quality metrics, and best practice recommendations. A written personalized care plan for preventive services as well as general preventive health recommendations were provided to patient.     Marzella Schlein, LPN   09/09/1094   After Visit Summary: (MyChart) Due to this being a telephonic visit, the after visit summary with patients personalized plan was offered to patient via MyChart   Nurse Notes: none

## 2023-03-06 NOTE — Patient Instructions (Signed)
Kristin [name] , Thank you for taking time to come for your Medicare Wellness Visit. I appreciate your ongoing commitment to your health goals. Please review the following plan we discussed and let me know if I can assist you in the future.   These are the goals we discussed:  Goals       Increase physical activity      Exercise 4-6 days per week      Keep a postive mindset. (pt-stated)      Patient Stated      Drink more water & lose more weight        This is a list of the screening recommended for you and due dates:  Health Maintenance  Topic Date Due   COVID-19 Vaccine (8 - 2023-24 season) 05/05/2022   Flu Shot  04/05/2023   Medicare Annual Wellness Visit  03/05/2024   DTaP/Tdap/Td vaccine (3 - Td or Tdap) 01/03/2025   Mammogram  02/01/2025   Pneumonia Vaccine  Completed   DEXA scan (bone density measurement)  Completed   Hepatitis C Screening  Completed   Zoster (Shingles) Vaccine  Completed   HPV Vaccine  Aged Out   Colon Cancer Screening  Discontinued    Advanced directives: copies in chart   Conditions/risks identified: lose weight   Next appointment: Follow up in one year for your annual wellness visit    Preventive Care 65 Years and Older, Female Preventive care refers to lifestyle choices and visits with your health care provider that can promote health and wellness. What does preventive care include? A yearly physical exam. This is also called an annual well check. Dental exams once or twice a year. Routine eye exams. Ask your health care provider how often you should have your eyes checked. Personal lifestyle choices, including: Daily care of your teeth and gums. Regular physical activity. Eating a healthy diet. Avoiding tobacco and drug use. Limiting alcohol use. Practicing safe sex. Taking low-dose aspirin every day. Taking vitamin and mineral supplements as recommended by your health care provider. What happens during an annual well check? The  services and screenings done by your health care provider during your annual well check will depend on your age, overall health, lifestyle risk factors, and family history of disease. Counseling  Your health care provider may ask you questions about your: Alcohol use. Tobacco use. Drug use. Emotional well-being. Home and relationship well-being. Sexual activity. Eating habits. History of falls. Memory and ability to understand (cognition). Work and work Astronomer. Reproductive health. Screening  You may have the following tests or measurements: Height, weight, and BMI. Blood pressure. Lipid and cholesterol levels. These may be checked every 5 years, or more frequently if you are over 33 years old. Skin check. Lung cancer screening. You may have this screening every year starting at age 29 if you have a 30-pack-year history of smoking and currently smoke or have quit within the past 15 years. Fecal occult blood test (FOBT) of the stool. You may have this test every year starting at age 45. Flexible sigmoidoscopy or colonoscopy. You may have a sigmoidoscopy every 5 years or a colonoscopy every 10 years starting at age 88. Hepatitis C blood test. Hepatitis B blood test. Sexually transmitted disease (STD) testing. Diabetes screening. This is done by checking your blood sugar (glucose) after you have not eaten for a while (fasting). You may have this done every 1-3 years. Bone density scan. This is done to screen for osteoporosis. You may have  this done starting at age 67. Mammogram. This may be done every 1-2 years. Talk to your health care provider about how often you should have regular mammograms. Talk with your health care provider about your test results, treatment options, and if necessary, the need for more tests. Vaccines  Your health care provider may recommend certain vaccines, such as: Influenza vaccine. This is recommended every year. Tetanus, diphtheria, and acellular  pertussis (Tdap, Td) vaccine. You may need a Td booster every 10 years. Zoster vaccine. You may need this after age 8. Pneumococcal 13-valent conjugate (PCV13) vaccine. One dose is recommended after age 97. Pneumococcal polysaccharide (PPSV23) vaccine. One dose is recommended after age 46. Talk to your health care provider about which screenings and vaccines you need and how often you need them. This information is not intended to replace advice given to you by your health care provider. Make sure you discuss any questions you have with your health care provider. Document Released: 09/17/2015 Document Revised: 05/10/2016 Document Reviewed: 06/22/2015 Elsevier Interactive Patient Education  2017 [place] Prevention in the Home Falls can cause injuries. They can happen to people of all ages. There are many things you can do to make your home safe and to help prevent falls. What can I do on the outside of my home? Regularly fix the edges of walkways and driveways and fix any cracks. Remove anything that might make you trip as you walk through a door, such as a raised step or threshold. Trim any bushes or trees on the path to your home. Use bright outdoor lighting. Clear any walking paths of anything that might make someone trip, such as rocks or tools. Regularly check to see if handrails are loose or broken. Make sure that both sides of any steps have handrails. Any raised decks and porches should have guardrails on the edges. Have any leaves, snow, or ice cleared regularly. Use sand or salt on walking paths during winter. Clean up any spills in your garage right away. This includes oil or grease spills. What can I do in the bathroom? Use night lights. Install grab bars by the toilet and in the tub and shower. Do not use towel bars as grab bars. Use non-skid mats or decals in the tub or shower. If you need to sit down in the shower, use a plastic, non-slip stool. Keep the floor  dry. Clean up any water that spills on the floor as soon as it happens. Remove soap buildup in the tub or shower regularly. Attach bath mats securely with double-sided non-slip rug tape. Do not have throw rugs and other things on the floor that can make you trip. What can I do in the bedroom? Use night lights. Make sure that you have a light by your bed that is easy to reach. Do not use any sheets or blankets that are too big for your bed. They should not hang down onto the floor. Have a firm chair that has side arms. You can use this for support while you get dressed. Do not have throw rugs and other things on the floor that can make you trip. What can I do in the kitchen? Clean up any spills right away. Avoid walking on wet floors. Keep items that you use a lot in easy-to-reach places. If you need to reach something above you, use a strong step stool that has a grab bar. Keep electrical cords out of the way. Do not use floor polish or  wax that makes floors slippery. If you must use wax, use non-skid floor wax. Do not have throw rugs and other things on the floor that can make you trip. What can I do with my stairs? Do not leave any items on the stairs. Make sure that there are handrails on both sides of the stairs and use them. Fix handrails that are broken or loose. Make sure that handrails are as long as the stairways. Check any carpeting to make sure that it is firmly attached to the stairs. Fix any carpet that is loose or worn. Avoid having throw rugs at the top or bottom of the stairs. If you do have throw rugs, attach them to the floor with carpet tape. Make sure that you have a light switch at the top of the stairs and the bottom of the stairs. If you do not have them, ask someone to add them for you. What else can I do to help prevent falls? Wear shoes that: Do not have high heels. Have rubber bottoms. Are comfortable and fit you well. Are closed at the toe. Do not wear  sandals. If you use a stepladder: Make sure that it is fully opened. Do not climb a closed stepladder. Make sure that both sides of the stepladder are locked into place. Ask someone to hold it for you, if possible. Clearly mark and make sure that you can see: Any grab bars or handrails. First and last steps. Where the edge of each step is. Use tools that help you move around (mobility aids) if they are needed. These include: Canes. Walkers. Scooters. Crutches. Turn on the lights when you go into a dark area. Replace any light bulbs as soon as they burn out. Set up your furniture so you have a clear path. Avoid moving your furniture around. If any of your floors are uneven, fix them. If there are any pets around you, be aware of where they are. Review your medicines with your doctor. Some medicines can make you feel dizzy. This can increase your chance of falling. Ask your doctor what other things that you can do to help prevent falls. This information is not intended to replace advice given to you by your health care provider. Make sure you discuss any questions you have with your health care provider. Document Released: 06/17/2009 Document Revised: 01/27/2016 Document Reviewed: 09/25/2014 Elsevier Interactive Patient Education  2017 [employer].

## 2023-03-06 NOTE — Therapy (Signed)
OUTPATIENT PHYSICAL THERAPY NEURO EVALUATION   Patient Name: Kristin [name] MRN: [id number] DOB:[birth year], 72 y.o., female Today's Date: 03/06/2023   PCP: Shelva Majestic, MD REFERRING PROVIDER: Vladimir Faster, DO   END OF SESSION:  PT End of Session - 03/06/23 0854     Visit Number 1    Number of Visits 13    Date for PT Re-Evaluation 04/20/23    Authorization Type Medicare/BCBS    PT Start Time 0854    PT Stop Time 0933    PT Time Calculation (min) 39 min    Activity Tolerance Patient tolerated treatment well    Behavior During Therapy [facility] for tasks assessed/performed             Past Medical History:  Diagnosis Date   Abnormal vaginal Pap smear    Acute pharyngitis 02/25/2013   Anemia    Anxiety    Arthritis    BCC (basal cell carcinoma of skin) 10/05/2014   On back   Chicken pox as a child   Chronic UTI    sees dr Vonita Moss   Constipation 12/07/2015   Depression with anxiety 11/02/2009   Qualifier: Diagnosis of  By: Mayford Knife, LPN, Bonnye M    Dermatitis 07/17/2012   Esophageal stricture 1994   Fibroids    Foot pain, bilateral 06/19/2012   GERD (gastroesophageal reflux disease)    Hiatal hernia    Hyperglycemia 08/19/2013   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   Hyperlipidemia    Hypertension    Infertility, female    Low back pain 10/17/2007   Qualifier: Diagnosis of  By: Lovell Sheehan MD, [name]    Measles as a child   Obesity    Osteoarthritis    Parkinson disease    Plantar fasciitis of left foot 06/19/2012   Preventative health care 12/19/2015   Rheumatoid arthritis (HCC) 01/08/2018   Rosacea 10/05/2014   Swallowing difficulty    Urinary frequency 02/25/2013   Visual floaters 05/11/2014   Past Surgical History:  Procedure Laterality Date   ABDOMINAL HYSTERECTOMY  2006   total   esophageal     stretching   HERNIA REPAIR N/A 2022   hiatal hernia   laporoscopy     LEFT HEART CATH AND CORONARY ANGIOGRAPHY N/A 06/02/2019   Procedure: LEFT  HEART CATH AND CORONARY ANGIOGRAPHY;  Surgeon: Corky Crafts, MD;  Location: [facility];  Service: Cardiovascular;  Laterality: N/A;   TONSILLECTOMY     TOTAL HIP ARTHROPLASTY Right 2010   UPPER GASTROINTESTINAL ENDOSCOPY     wisdom teeth extracted     Patient Active Problem List   Diagnosis Date Noted   Vitamin B 12 deficiency 05/30/2021   Anemia 03/16/2020   Coronary artery disease involving native coronary artery of native heart without angina pectoris 06/12/2019   Diastolic dysfunction 05/16/2019   Rheumatoid arthritis (HCC) 01/08/2018   Hand pain, right 10/15/2017   Vitamin D deficiency 12/20/2016   Prediabetes 12/20/2016   Shortness of breath on exertion 11/27/2016   Morbid obesity (HCC)    Constipation 12/07/2015   BCC (basal cell carcinoma of skin) 10/05/2014   Rosacea 10/05/2014   Parkinson's disease (HCC) 05/11/2014   Screening for cervical cancer 07/17/2012   Foot pain, bilateral 06/19/2012   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   Depression with anxiety 11/02/2009   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   Lumbar back pain with radiculopathy affecting lower extremity 10/17/2007   Essential hypertension 07/05/2007   Osteoarthritis 07/05/2007   Hyperlipidemia  06/26/2007   H/O: iron deficiency anemia 05/08/2007   Hiatal hernia with GERD 05/08/2007    ONSET DATE: 02/15/23 (PD screen)  REFERRING DIAG: G20.A1 (ICD-10-CM) - Parkinson's disease without dyskinesia or fluctuating manifestations   THERAPY DIAG:  Other abnormalities of gait and mobility  Unsteadiness on feet  Abnormal posture  Other symptoms and signs involving the nervous system  Muscle weakness (generalized)  Rationale for Evaluation and Treatment: Rehabilitation  SUBJECTIVE:                                                                                                                                                                                             SUBJECTIVE  STATEMENT: Just slowing down and being nervous about falling.  Get nervous about big space, open spaces. Pt accompanied by: self  PERTINENT HISTORY: PD, anxiety, see additional PMH above  PAIN:  Are you having pain? Yes: NPRS scale: 4/10 Pain location: L knee Pain description: sore Aggravating factors: nothing Relieving factors: Aleve  PRECAUTIONS: Fall  WEIGHT BEARING RESTRICTIONS: No  FALLS: Has patient fallen in last 6 months? No  LIVING ENVIRONMENT: Lives with: lives with their family Lives in: House/apartment Stairs: Yes: Internal: 12 steps; bilateral but cannot reach both Has following equipment at home:  single walking pole  PLOF: Independent with household mobility without device and Independent with community mobility with device  PATIENT GOALS: To get rid of fear of falling  OBJECTIVE:   DIAGNOSTIC FINDINGS: NA for this episode  COGNITION: Overall cognitive status: Within functional limits for tasks assessed   SENSATION: Light touch: WFL  MUSCLE TONE: RLE: Mild  POSTURE: rounded shoulders and forward head  LOWER EXTREMITY ROM:   WFL in sitting  Active  Right Eval Left Eval  Hip flexion  pain  Hip extension    Hip abduction    Hip adduction    Hip internal rotation    Hip external rotation    Knee flexion    Knee extension    Ankle dorsiflexion    Ankle plantarflexion    Ankle inversion    Ankle eversion     (Blank rows = not tested)  LOWER EXTREMITY MMT:  Grossly tested in sitting  MMT Right Eval Left Eval  Hip flexion 4+ 4  Hip extension    Hip abduction    Hip adduction    Hip internal rotation    Hip external rotation    Knee flexion 4+ 4+  Knee extension 4+ 4+  Ankle dorsiflexion 4 4  Ankle plantarflexion    Ankle inversion    Ankle eversion    (Blank rows = not  tested)  TRANSFERS: Assistive device utilized: None  Sit to stand: Modified independence Stand to sit: Modified independence  GAIT: Gait pattern: step  through pattern, decreased arm swing- Right, decreased arm swing- Left, decreased stride length, and narrow BOS Distance walked: 100 ft Assistive device utilized:  single walking pole and None Level of assistance: Modified independence Comments: Guarded gait pattern  FUNCTIONAL TESTS:  5 times sit to stand: 12.31 sec Timed up and go (TUG): 14.34 sec 10 M:  12.1 sec = 2.7 ft/sec MiniBESTest: 22/28  TUG cognitive:14.56 sec  TUG manual:  14.62 sec  Full 360 turn:  8 steps  26M walk backwards:  16.10 sec = 0.62 ft/sec   M-CTSIB  Condition 1: Firm Surface, EO 30 Sec, Normal Sway  Condition 2: Firm Surface, EC 30 Sec, Mild Sway  Condition 3: Foam Surface, EO 30 Sec, Mild Sway  Condition 4: Foam Surface, EC 29.69 Sec, Moderate and Severe Sway    PATIENT SURVEYS:  ABC scale 78.75 (walking across parking lot is 50%, stepping off escalator is 60%  TODAY'S TREATMENT:                                                                                                                              DATE: 03/06/2023    PATIENT EDUCATION: Education details: Eval results, POC Person educated: Patient Education method: Explanation Education comprehension: verbalized understanding  HOME EXERCISE PROGRAM: Not yet initiated   GOALS: Goals reviewed with patient? Yes  SHORT TERM GOALS: Target date: 04/06/2023  Pt will be independent with HEP for improved balance, gait, strength. Baseline: Goal status: INITIAL  2.  Pt will improve 5x sit<>stand to less than or equal to 11 sec to demonstrate improved functional strength and transfer efficiency. Baseline: 12.31 sec Goal status: INITIAL  3.  Pt will improve 26M walk backwards to at least 1 ft/sec for improved balance. Baseline: 0.62 ft/sec  Goal status: INITIAL  4.  Pt will improve MCTSIB Condition 4 to minimal sway for improved balance.  Baseline:  Goal status: INITIAL  LONG TERM GOALS: Target date: 04/20/2023  Pt will be Independent with HEP  for improved balance, strength, gait. Baseline:  Goal status: INITIAL  2.  Pt will improve MiniBESTest score to at least 24/28 to decrease fall risk. Baseline: 22/28 Goal status: INITIAL  3.  Pt will improve ABC score to at least 85% for improved overall balance confidence  Baseline: 78.75% Goal status: INITIAL  4.  Pt will report improved confidence in curb, outdoor surface negotiation by at least 50% for improved community mobility. Baseline:  Goal status: INITIAL   ASSESSMENT:  CLINICAL IMPRESSION: Patient is a 72 y.o. female who was seen today for physical therapy evaluation and treatment for Parkinson's disease, upon recommendation from PD screen in June 2024.  At PD screen, she had slowed mobility measures and she reports overall increased fear of falling and decreased balance confidence, especially with outdoor gait.  She demonstrates slowed TUG scores compared to d/c from PT in 06/2022.  She presents with decreased functional strength, decreased balance, bradykinesia, abnormal posture, postural instability, decreased confidence with gait (as evidenced by score 78.75 on ABC score).  She demonstrates increased fall risk on MiniBEStest and decreased multi-system sensory use for balance, with decreased ability to perform EC on cushion surfaces.  She will benefit from skilled PT to address the above deficits to decrease fall risk and improve overall functional mobility.  OBJECTIVE IMPAIRMENTS: Abnormal gait, decreased balance, decreased mobility, difficulty walking, decreased strength, and postural dysfunction.   ACTIVITY LIMITATIONS: standing, stairs, transfers, locomotion level, and caring for others  PARTICIPATION LIMITATIONS: shopping and community activity  PERSONAL FACTORS: 3+ comorbidities: see PMH above  are also affecting patient's functional outcome.   REHAB POTENTIAL: Good  CLINICAL DECISION MAKING: Evolving/moderate complexity  EVALUATION COMPLEXITY:  Moderate  PLAN:  PT FREQUENCY: 2x/week  PT DURATION: 6 weeks plus eval week  PLANNED INTERVENTIONS: Therapeutic exercises, Therapeutic activity, Neuromuscular re-education, Balance training, Gait training, Patient/Family education, and Self Care  PLAN FOR NEXT SESSION: Initiate HEP for balance-EO and EC foam surfaces, SLS, curb and stair negotiation, unlevel surfaces   Bernarr Longsworth W., PT 03/06/2023, 2:23 PM  [facility] [address] [place], [place] [address] Phone # [phone] Fax # [phone]

## 2023-03-11 ENCOUNTER — Other Ambulatory Visit: Payer: Self-pay | Admitting: Family Medicine

## 2023-03-13 ENCOUNTER — Encounter: Payer: Self-pay | Admitting: Physical Therapy

## 2023-03-13 ENCOUNTER — Ambulatory Visit: Payer: Medicare Other | Admitting: Physical Therapy

## 2023-03-13 DIAGNOSIS — R29818 Other symptoms and signs involving the nervous system: Secondary | ICD-10-CM | POA: Diagnosis not present

## 2023-03-13 DIAGNOSIS — Z124 Encounter for screening for malignant neoplasm of cervix: Secondary | ICD-10-CM | POA: Diagnosis not present

## 2023-03-13 DIAGNOSIS — R2681 Unsteadiness on feet: Secondary | ICD-10-CM

## 2023-03-13 DIAGNOSIS — R293 Abnormal posture: Secondary | ICD-10-CM

## 2023-03-13 DIAGNOSIS — Z6841 Body Mass Index (BMI) 40.0 and over, adult: Secondary | ICD-10-CM | POA: Diagnosis not present

## 2023-03-13 DIAGNOSIS — M6281 Muscle weakness (generalized): Secondary | ICD-10-CM | POA: Diagnosis not present

## 2023-03-13 DIAGNOSIS — Z1272 Encounter for screening for malignant neoplasm of vagina: Secondary | ICD-10-CM | POA: Diagnosis not present

## 2023-03-13 DIAGNOSIS — G20A1 Parkinson's disease without dyskinesia, without mention of fluctuations: Secondary | ICD-10-CM | POA: Diagnosis not present

## 2023-03-13 DIAGNOSIS — R2689 Other abnormalities of gait and mobility: Secondary | ICD-10-CM

## 2023-03-13 DIAGNOSIS — R87620 Atypical squamous cells of undetermined significance on cytologic smear of vagina (ASC-US): Secondary | ICD-10-CM | POA: Diagnosis not present

## 2023-03-13 NOTE — Therapy (Signed)
OUTPATIENT PHYSICAL THERAPY NEURO TREATMENT   Patient Name: Kristin [name] MRN: [id number] DOB:[birth year], 72 y.o., female Today's Date: 03/13/2023   PCP: Shelva Majestic, MD REFERRING PROVIDER: Vladimir Faster, DO   END OF SESSION:  PT End of Session - 03/13/23 0852     Visit Number 2    Number of Visits 13    Date for PT Re-Evaluation 04/20/23    Authorization Type Medicare/BCBS    PT Start Time 0850    PT Stop Time 0931    PT Time Calculation (min) 41 min    Activity Tolerance Patient tolerated treatment well    Behavior During Therapy [facility] for tasks assessed/performed             Past Medical History:  Diagnosis Date   Abnormal vaginal Pap smear    Acute pharyngitis 02/25/2013   Anemia    Anxiety    Arthritis    BCC (basal cell carcinoma of skin) 10/05/2014   On back   Chicken pox as a child   Chronic UTI    sees dr Vonita Moss   Constipation 12/07/2015   Depression with anxiety 11/02/2009   Qualifier: Diagnosis of  By: Mayford Knife, LPN, Bonnye M    Dermatitis 07/17/2012   Esophageal stricture 1994   Fibroids    Foot pain, bilateral 06/19/2012   GERD (gastroesophageal reflux disease)    Hiatal hernia    Hyperglycemia 08/19/2013   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   Hyperlipidemia    Hypertension    Infertility, female    Low back pain 10/17/2007   Qualifier: Diagnosis of  By: Lovell Sheehan MD, [name]    Measles as a child   Obesity    Osteoarthritis    Parkinson disease    Plantar fasciitis of left foot 06/19/2012   Preventative health care 12/19/2015   Rheumatoid arthritis (HCC) 01/08/2018   Rosacea 10/05/2014   Swallowing difficulty    Urinary frequency 02/25/2013   Visual floaters 05/11/2014   Past Surgical History:  Procedure Laterality Date   ABDOMINAL HYSTERECTOMY  2006   total   esophageal     stretching   HERNIA REPAIR N/A 2022   hiatal hernia   laporoscopy     LEFT HEART CATH AND CORONARY ANGIOGRAPHY N/A 06/02/2019   Procedure: LEFT HEART  CATH AND CORONARY ANGIOGRAPHY;  Surgeon: Corky Crafts, MD;  Location: [facility];  Service: Cardiovascular;  Laterality: N/A;   TONSILLECTOMY     TOTAL HIP ARTHROPLASTY Right 2010   UPPER GASTROINTESTINAL ENDOSCOPY     wisdom teeth extracted     Patient Active Problem List   Diagnosis Date Noted   Vitamin B 12 deficiency 05/30/2021   Anemia 03/16/2020   Coronary artery disease involving native coronary artery of native heart without angina pectoris 06/12/2019   Diastolic dysfunction 05/16/2019   Rheumatoid arthritis (HCC) 01/08/2018   Hand pain, right 10/15/2017   Vitamin D deficiency 12/20/2016   Prediabetes 12/20/2016   Shortness of breath on exertion 11/27/2016   Morbid obesity (HCC)    Constipation 12/07/2015   BCC (basal cell carcinoma of skin) 10/05/2014   Rosacea 10/05/2014   Parkinson's disease (HCC) 05/11/2014   Screening for cervical cancer 07/17/2012   Foot pain, bilateral 06/19/2012   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   Depression with anxiety 11/02/2009   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   Lumbar back pain with radiculopathy affecting lower extremity 10/17/2007   Essential hypertension 07/05/2007   Osteoarthritis 07/05/2007   Hyperlipidemia  06/26/2007   H/O: iron deficiency anemia 05/08/2007   Hiatal hernia with GERD 05/08/2007    ONSET DATE: 02/15/23 (PD screen)  REFERRING DIAG: G20.A1 (ICD-10-CM) - Parkinson's disease without dyskinesia or fluctuating manifestations   THERAPY DIAG:  Other abnormalities of gait and mobility  Unsteadiness on feet  Abnormal posture  Rationale for Evaluation and Treatment: Rehabilitation  SUBJECTIVE:                                                                                                                                                                                             SUBJECTIVE STATEMENT: Having a hard few days, with several other people falling.  Just very nervous.   Pt  accompanied by: self  PERTINENT HISTORY: PD, anxiety, see additional PMH above  PAIN:  Are you having pain? Yes: NPRS scale: 4/10 Pain location: L knee Pain description: sore Aggravating factors: nothing Relieving factors: Aleve  PRECAUTIONS: Fall  WEIGHT BEARING RESTRICTIONS: No  FALLS: Has patient fallen in last 6 months? No  LIVING ENVIRONMENT: Lives with: lives with their family Lives in: House/apartment Stairs: Yes: Internal: 12 steps; bilateral but cannot reach both Has following equipment at home:  single walking pole  PLOF: Independent with household mobility without device and Independent with community mobility with device  PATIENT GOALS: To get rid of fear of falling  OBJECTIVE:    TODAY'S TREATMENT: 03/13/2023 Activity Comments  Heel/toe raises 2 x 10 Stagger stance forward/back 2 x 10 UE support at counter  Wide BOS lateral weightshifting 2 x 10 Lateral weightshift with squat in middle, 2 x 10 UE support at counter  Forward/back walking at counter, 3 reps   Sidestepping at counter, 3 reps   Forward/back marching Reports dizziness/unsteadiness  Vitals 144/94, HR 80 bpm   Sit<>stand x 5 reps, 3 sets 3rd set standing on Airex  Wall bumps, 10 reps Hips>shoulders, shoulders>hips   PATIENT EDUCATION: Education details: ways to reduce fear of falling Person educated: Patient Education method: Explanation Education comprehension: verbalized understanding  HOME EXERCISE PROGRAM: Not yet initiated  ------------------------------------------------------------ Objective measures below taken at initial evaluation:  DIAGNOSTIC FINDINGS: NA for this episode  COGNITION: Overall cognitive status: Within functional limits for tasks assessed   SENSATION: Light touch: WFL  MUSCLE TONE: RLE: Mild  POSTURE: rounded shoulders and forward head  LOWER EXTREMITY ROM:   WFL in sitting  Active  Right Eval Left Eval  Hip flexion  pain  Hip extension    Hip  abduction    Hip adduction    Hip internal rotation    Hip external rotation    Knee flexion  Knee extension    Ankle dorsiflexion    Ankle plantarflexion    Ankle inversion    Ankle eversion     (Blank rows = not tested)  LOWER EXTREMITY MMT:  Grossly tested in sitting  MMT Right Eval Left Eval  Hip flexion 4+ 4  Hip extension    Hip abduction    Hip adduction    Hip internal rotation    Hip external rotation    Knee flexion 4+ 4+  Knee extension 4+ 4+  Ankle dorsiflexion 4 4  Ankle plantarflexion    Ankle inversion    Ankle eversion    (Blank rows = not tested)  TRANSFERS: Assistive device utilized: None  Sit to stand: Modified independence Stand to sit: Modified independence  GAIT: Gait pattern: step through pattern, decreased arm swing- Right, decreased arm swing- Left, decreased stride length, and narrow BOS Distance walked: 100 ft Assistive device utilized:  single walking pole and None Level of assistance: Modified independence Comments: Guarded gait pattern  FUNCTIONAL TESTS:  5 times sit to stand: 12.31 sec Timed up and go (TUG): 14.34 sec 10 M:  12.1 sec = 2.7 ft/sec MiniBESTest: 22/28  TUG cognitive:14.56 sec  TUG manual:  14.62 sec  Full 360 turn:  8 steps  21M walk backwards:  16.10 sec = 0.62 ft/sec   M-CTSIB  Condition 1: Firm Surface, EO 30 Sec, Normal Sway  Condition 2: Firm Surface, EC 30 Sec, Mild Sway  Condition 3: Foam Surface, EO 30 Sec, Mild Sway  Condition 4: Foam Surface, EC 29.69 Sec, Moderate and Severe Sway    PATIENT SURVEYS:  ABC scale 78.75 (walking across parking lot is 50%, stepping off escalator is 60%  TODAY'S TREATMENT:                                                                                                                              DATE: 03/06/2023    PATIENT EDUCATION: Education details: Eval results, POC Person educated: Patient Education method: Explanation Education comprehension: verbalized  understanding  HOME EXERCISE PROGRAM: Not yet initiated   GOALS: Goals reviewed with patient? Yes  SHORT TERM GOALS: Target date: 04/06/2023  Pt will be independent with HEP for improved balance, gait, strength. Baseline: Goal status: IN PROGRESS  2.  Pt will improve 5x sit<>stand to less than or equal to 11 sec to demonstrate improved functional strength and transfer efficiency. Baseline: 12.31 sec Goal status: IN PROGRESS  3.  Pt will improve 21M walk backwards to at least 1 ft/sec for improved balance. Baseline: 0.62 ft/sec  Goal status: IN PROGRESS  4.  Pt will improve MCTSIB Condition 4 to minimal sway for improved balance.  Baseline:  Goal status:IN PROGRESS  LONG TERM GOALS: Target date: 04/20/2023  Pt will be Independent with HEP for improved balance, strength, gait. Baseline:  Goal status: IN PROGRESS  2.  Pt will improve MiniBESTest score to at least 24/28 to decrease  fall risk. Baseline: 22/28 Goal status: IN PROGRESS  3.  Pt will improve ABC score to at least 85% for improved overall balance confidence  Baseline: 78.75% Goal status: IN PROGRESS  4.  Pt will report improved confidence in curb, outdoor surface negotiation by at least 50% for improved community mobility. Baseline:  Goal status: IN PROGRESS   ASSESSMENT:  CLINICAL IMPRESSION: Skilled PT session focused on balance strategies and dynamic balance activities.  Pt fearful of falling and appears very guarded with motion.  Worked on varied directions for weightshifting, with pt relying on BUE support throughout.  She will continue to benefit from skilled PT towards goals for improved functional mobility and decreased fall risk.    OBJECTIVE IMPAIRMENTS: Abnormal gait, decreased balance, decreased mobility, difficulty walking, decreased strength, and postural dysfunction.   ACTIVITY LIMITATIONS: standing, stairs, transfers, locomotion level, and caring for others  PARTICIPATION LIMITATIONS: shopping  and community activity  PERSONAL FACTORS: 3+ comorbidities: see PMH above  are also affecting patient's functional outcome.   REHAB POTENTIAL: Good  CLINICAL DECISION MAKING: Evolving/moderate complexity  EVALUATION COMPLEXITY: Moderate  PLAN:  PT FREQUENCY: 2x/week  PT DURATION: 6 weeks plus eval week  PLANNED INTERVENTIONS: Therapeutic exercises, Therapeutic activity, Neuromuscular re-education, Balance training, Gait training, Patient/Family education, and Self Care  PLAN FOR NEXT SESSION: Initiate HEP for balance-weightshifting, EO and EC foam surfaces, SLS, curb and stair negotiation, unlevel surfaces   Lonia Blood, PT 03/13/23 9:31 AM Phone: [phone] Fax: [phone]   [facility] [address], [address] [place], [place] [address] Phone # [phone] Fax # [phone]

## 2023-03-14 DIAGNOSIS — N3941 Urge incontinence: Secondary | ICD-10-CM | POA: Diagnosis not present

## 2023-03-14 DIAGNOSIS — K5900 Constipation, unspecified: Secondary | ICD-10-CM | POA: Diagnosis not present

## 2023-03-14 DIAGNOSIS — M6289 Other specified disorders of muscle: Secondary | ICD-10-CM | POA: Diagnosis not present

## 2023-03-14 DIAGNOSIS — M6281 Muscle weakness (generalized): Secondary | ICD-10-CM | POA: Diagnosis not present

## 2023-03-15 ENCOUNTER — Ambulatory Visit: Payer: Medicare Other | Admitting: Physical Therapy

## 2023-03-16 NOTE — Progress Notes (Signed)
Assessment/Plan:   1.  Parkinsons Disease             -Continue pramipexole 0.5 mg 3 times per day.  Higher dosages with SE and still with minimal compulsive SE (cleaning house)             -Continue carbidopa/levodopa 25/100, 2/2/1 (can chew half to 1 in the middle of the night if awakens with tremor).              -She will continue Carbidopa/levodopa 50/200 at night.  This has definitely helped restless leg and has helped most of the nighttime cramping of the feet and legs.  -add entacapone, 200 mg with each dose of levodopa.  See if this helps with internal tremor  -she is following with dermatology at [facility] dermatology 2.  Depression and anxiety             - She is on a combination of Lexapro and Cymbalta  -c/o insomnia but most of the meds I would recommend (mirtazapine, trazodone) are not going to do well with the lexapro/cymbalta and worry about the QT interval.    -Following with a counselor.  -refer to Dr. Vanetta Shawl.  She was very agreeable and happy to have this resource 3.  Dysphagia             -last MBE May, 2021 with mild pharyngeal cervical esophageal deficits.  It was recommended to consider esophageal assessment.  She has an EGD upcoming to assess and an esophageal barium swallow 4.  Rheumatoid arthritis             -On methotrexate.  Following with [facility]ology 5.  REM behavior disorder             -Understands safety associated with this.  Does not want more medications. 6.  Gastroparesis             -Following with Dr. Arta Silence  -Failed Linzess and trulance  -on ibserla 7.  Excess sweating  -is long term but discussed with her that I don't want to add meds b/c clonidine is tx and she already has dizziness.    -cymbalta may be contributing to the sweating but she tried to d/c and was crying all the time.  Perhaps Dr. Vanetta Shawl can help, if the cymbalta is contributing 8.  Dizziness  -may have some mild Neurogenic Orthostatic Hypotension.    -she is on losarten and  carvidelol.  She has upcoming appt with Dr. [facility] Salvia and I asked her to check her BP at home in various positions between now and then.  Discussed concept of permissive HTN     Subjective:   Kristin [name] was seen today in follow up for Parkinsons disease.  My previous records were reviewed prior to todays visit as well as outside records available to me.  Husband with patient and supplements history.  She has had no falls since last visit.   She has been following with Dr. Arta Silence for gastroparesis.  She tried a specific diet for this, which seemed to make the constipation worse.  She had no improvement with MiraLAX, Linzess, Trulance and she was started on Ibserla.  She c/o dizziness and excess sweating.  C/o not sleeping well and increased anxiety.  Has internal tremor.    Current prescribed movement disorder medications: Carbidopa/levodopa 25/100, 2 tablets in the morning, 2 in the afternoon and 1 in the evening (7am/then between 11am-noon/4pm) Carbidopa/levodopa 50/200 at bedtime Pramipexole 0.5  mg 3 times per day   Prior meds: pramipexole (higher dosages with mild compulsive behavior - cleaning/some shopping)   ALLERGIES:   Allergies  Allergen Reactions   Scopolamine Other (See Comments)    Panic hallu  Other Reaction(s): Hallucination, Not available   Prednisone Dermatitis    Facial ulcers  Other Reaction(s): facial swelling   Bactrim [Sulfamethoxazole-Trimethoprim] Other (See Comments)    Oral ulcers and rash   Penicillins Hives and Other (See Comments)    Did it involve swelling of the face/tongue/throat, SOB, or low BP? No  Did it involve sudden or severe rash/hives, skin peeling, or any reaction on the inside of your mouth or nose? No  Did you need to seek medical attention at a hospital or doctor's office? No  When did it last happen?      30+ years ago  If all above answers are "NO", may proceed with cephalosporin use.   Sulfa Antibiotics     Other Reaction(s):  Not available    CURRENT MEDICATIONS:  Outpatient Encounter Medications as of 03/20/2023  Medication Sig   ALPRAZolam (XANAX) 0.25 MG tablet TAKE 1 TABLET BY MOUTH 2 TIMES DAILY AS NEEDED FOR ANXIETY.   carbidopa-levodopa (SINEMET CR) 50-200 MG tablet TAKE 1 TABLET BY MOUTH EVERYDAY AT BEDTIME   carbidopa-levodopa (SINEMET IR) 25-100 MG tablet TAKE 2 TABLETS BY MOUTH AT 7AM, 2TABLETS AT 11AM, 1 TABLET AT 4PM   carvedilol (COREG) 3.125 MG tablet TAKE 1 TABLET BY MOUTH TWICE A DAY WITH MEALS   DULoxetine (CYMBALTA) 30 MG capsule TAKE 1 CAPSULE BY MOUTH EVERY DAY   escitalopram (LEXAPRO) 10 MG tablet Take 1 tablet (10 mg total) by mouth daily.   folic acid (FOLVITE) 1 MG tablet Take 1 mg by mouth 2 (two) times daily.   losartan (COZAAR) 50 MG tablet TAKE 1 TABLET BY MOUTH EVERY DAY   omeprazole (PRILOSEC) 20 MG capsule TAKE 1 CAPSULE BY MOUTH 2 TIMES DAILY AS NEEDED.   polyethylene glycol (MIRALAX / GLYCOLAX) 17 g packet Take 17 g by mouth daily.   pramipexole (MIRAPEX) 0.5 MG tablet TAKE 1 TABLET BY MOUTH 3 TIMES DAILY.   rosuvastatin (CRESTOR) 10 MG tablet TAKE 1 TABLET BY MOUTH ON MONDAYS,WEDNESDAYS, AND FRIDAYS (Patient taking differently: TAKE 1 TABLET BY MOUTH pt takes on Sundays, Tuesdays and Thursdays)   TRULANCE 3 MG TABS TAKE 1 TABLET BY MOUTH EVERY DAY   Vitamin D, Ergocalciferol, (DRISDOL) 1.25 MG (50000 UNIT) CAPS capsule TAKE 1 CAPSULE (50,000 UNITS TOTAL) BY MOUTH EVERY 7 (SEVEN) DAYS   No facility-administered encounter medications on file as of 03/20/2023.    Objective:   PHYSICAL EXAMINATION:    VITALS:   Vitals:   03/20/23 0900  BP: 124/82  Pulse: 98  SpO2: 97%  Weight: 238 lb 3.2 oz (108 kg)  Height: 5\' 3"  (1.6 m)     GEN:  The patient appears stated age and is in NAD. HEENT:  Normocephalic, atraumatic.  The mucous membranes are moist.  CV:  RRR Lungs:  CTAB Neck:  no bruits  Neurological examination:  Orientation: The patient is alert and oriented  x3. Cranial nerves: There is good facial symmetry without significant hypomimia.  EOMI.   The speech is fluent and clear. Soft palate rises symmetrically and there is no tongue deviation. Hearing is intact to conversational tone. Sensation: Sensation is intact to light touch throughout Motor: Strength is at least antigravity x4.  Movement examination: Tone: There is normal tone today  Abnormal movements: mild L foot tremor Coordination:  There is good RAMs today in the bilateral UE/LE.  No decremation Gait and Station: The patient has no difficulty arising out of a deep-seated chair without the use of the hands. The patients gait is slow and tenuous but she isn't shuffling.  This is same as previous  I have reviewed and interpreted the following labs independently    Chemistry      Component Value Date/Time   NA 140 09/27/2022 0937   NA 144 12/05/2019 0935   K 4.3 09/27/2022 0937   CL 105 09/27/2022 0937   CO2 27 09/27/2022 0937   BUN 17 09/27/2022 0937   BUN 13 12/05/2019 0935   CREATININE 0.64 09/27/2022 0937   CREATININE 0.71 01/04/2015 1046      Component Value Date/Time   CALCIUM 9.3 09/27/2022 0937   ALKPHOS 90 09/27/2022 0937   AST 20 09/27/2022 0937   ALT 9 09/27/2022 0937   BILITOT 0.6 09/27/2022 0937   BILITOT 0.4 11/27/2016 1123       Lab Results  Component Value Date   WBC 6.1 09/27/2022   HGB 12.8 09/27/2022   HCT 38.4 09/27/2022   MCV 90.5 09/27/2022   PLT 198.0 09/27/2022    Lab Results  Component Value Date   TSH 1.10 01/12/2022     Total time spent on today's visit was 34 minutes, including both face-to-face time and nonface-to-face time.  Time included that spent on review of records (prior notes available to me/labs/imaging if pertinent), discussing treatment and goals, answering patient's questions and coordinating care.  Cc:  Shelva Majestic, MD

## 2023-03-19 ENCOUNTER — Ambulatory Visit: Payer: Medicare Other | Admitting: Physical Therapy

## 2023-03-19 DIAGNOSIS — M25562 Pain in left knee: Secondary | ICD-10-CM | POA: Diagnosis not present

## 2023-03-19 DIAGNOSIS — R2681 Unsteadiness on feet: Secondary | ICD-10-CM | POA: Diagnosis not present

## 2023-03-19 DIAGNOSIS — R2689 Other abnormalities of gait and mobility: Secondary | ICD-10-CM

## 2023-03-19 DIAGNOSIS — G20A1 Parkinson's disease without dyskinesia, without mention of fluctuations: Secondary | ICD-10-CM | POA: Diagnosis not present

## 2023-03-19 DIAGNOSIS — M6281 Muscle weakness (generalized): Secondary | ICD-10-CM | POA: Diagnosis not present

## 2023-03-19 DIAGNOSIS — R29818 Other symptoms and signs involving the nervous system: Secondary | ICD-10-CM

## 2023-03-19 DIAGNOSIS — R293 Abnormal posture: Secondary | ICD-10-CM | POA: Diagnosis not present

## 2023-03-19 NOTE — Therapy (Signed)
OUTPATIENT PHYSICAL THERAPY NEURO TREATMENT   Patient Name: Kristin [name] MRN: [id number] DOB:Mar 30, 1951, 72 y.o., female Today's Date: 03/19/2023   PCP: Shelva Majestic, MD REFERRING PROVIDER: Vladimir Faster, DO   END OF SESSION:  PT End of Session - 03/19/23 1711     Visit Number 3    Number of Visits 13    Date for PT Re-Evaluation 04/20/23    Authorization Type Medicare/BCBS    PT Start Time 1616    PT Stop Time 1658    PT Time Calculation (min) 42 min    Activity Tolerance Patient tolerated treatment well    Behavior During Therapy [facility] for tasks assessed/performed;Anxious              Past Medical History:  Diagnosis Date   Abnormal vaginal Pap smear    Acute pharyngitis 02/25/2013   Anemia    Anxiety    Arthritis    BCC (basal cell carcinoma of skin) 10/05/2014   On back   Chicken pox as a child   Chronic UTI    sees dr Vonita Moss   Constipation 12/07/2015   Depression with anxiety 11/02/2009   Qualifier: Diagnosis of  By: Mayford Knife, LPN, Bonnye M    Dermatitis 07/17/2012   Esophageal stricture 1994   Fibroids    Foot pain, bilateral 06/19/2012   GERD (gastroesophageal reflux disease)    Hiatal hernia    Hyperglycemia 08/19/2013   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   Hyperlipidemia    Hypertension    Infertility, female    Low back pain 10/17/2007   Qualifier: Diagnosis of  By: Lovell Sheehan MD, [name]    Measles as a child   Obesity    Osteoarthritis    Parkinson disease    Plantar fasciitis of left foot 06/19/2012   Preventative health care 12/19/2015   Rheumatoid arthritis (HCC) 01/08/2018   Rosacea 10/05/2014   Swallowing difficulty    Urinary frequency 02/25/2013   Visual floaters 05/11/2014   Past Surgical History:  Procedure Laterality Date   ABDOMINAL HYSTERECTOMY  2006   total   esophageal     stretching   HERNIA REPAIR N/A 2022   hiatal hernia   laporoscopy     LEFT HEART CATH AND CORONARY ANGIOGRAPHY N/A 06/02/2019   Procedure:  LEFT HEART CATH AND CORONARY ANGIOGRAPHY;  Surgeon: Corky Crafts, MD;  Location: [facility] IN[facility]AS[facility];  Service: Cardiovascular;  Laterality: N/A;   TONSILLECTOMY     TOTAL HIP ARTHROPLASTY Right 2010   UPPER GASTROINTESTINAL ENDOSCOPY     wisdom teeth extracted     Patient Active Problem List   Diagnosis Date Noted   Vitamin B 12 deficiency 05/30/2021   Anemia 03/16/2020   Coronary artery disease involving native coronary artery of native heart without angina pectoris 06/12/2019   Diastolic dysfunction 05/16/2019   Rheumatoid arthritis (HCC) 01/08/2018   Hand pain, right 10/15/2017   Vitamin D deficiency 12/20/2016   Prediabetes 12/20/2016   Shortness of breath on exertion 11/27/2016   Morbid obesity (HCC)    Constipation 12/07/2015   BCC (basal cell carcinoma of skin) 10/05/2014   Rosacea 10/05/2014   Parkinson's disease (HCC) 05/11/2014   Screening for cervical cancer 07/17/2012   Foot pain, bilateral 06/19/2012   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   Depression with anxiety 11/02/2009   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   Lumbar back pain with radiculopathy affecting lower extremity 10/17/2007   Essential hypertension 07/05/2007   Osteoarthritis 07/05/2007  Hyperlipidemia 06/26/2007   H/O: iron deficiency anemia 05/08/2007   Hiatal hernia with GERD 05/08/2007    ONSET DATE: 02/15/23 (PD screen)  REFERRING DIAG: G20.A1 (ICD-10-CM) - Parkinson's disease without dyskinesia or fluctuating manifestations   THERAPY DIAG:  Unsteadiness on feet  Other symptoms and signs involving the nervous system  Other abnormalities of gait and mobility  Rationale for Evaluation and Treatment: Rehabilitation  SUBJECTIVE:                                                                                                                                                                                             SUBJECTIVE STATEMENT: Got a cortisone shot today due to bone on  bone arthritis on lateral part of L knee.  Saw the orthopedist.  Pt accompanied by: self  PERTINENT HISTORY: PD, anxiety, see additional PMH above  PAIN:  Are you having pain? Yes: NPRS scale: 0-3/10 Pain location: L knee Pain description: sore Aggravating factors: nothing Relieving factors: Aleve, cortisone shot today (7/15)  PRECAUTIONS: Fall 03/19/2023-No squats, no lunges, until the pain feels better.  WEIGHT BEARING RESTRICTIONS: No  FALLS: Has patient fallen in last 6 months? No  LIVING ENVIRONMENT: Lives with: lives with their family Lives in: House/apartment Stairs: Yes: Internal: 12 steps; bilateral but cannot reach both Has following equipment at home:  single walking pole  PLOF: Independent with household mobility without device and Independent with community mobility with device  PATIENT GOALS: To get rid of fear of falling  OBJECTIVE:    TODAY'S TREATMENT: 03/19/2023 Activity Comments  Orthostatic BP measures: Supine:  140/86 HR 92 bpm Standing after 1 min: 153/93 HR 97 bpm Standing after 3 min:  149/91 HR 96 bpm   Gait x 2 minutes with walking pole   Seated PWR! Moves: -PWR Up x 5 reps (high, middle, low positions) with cues for breathing -PWR! Rock x 5 reps -PWR! Twist x 3 reps -did not perform PWR! Step due to L knee pain Performed slow, sustained motions to help to slow tremors and address bradykinesia        Seated BP measures at end of session: 149/94 HR 97 *asked if pt had any other symptoms-pt denies headache, blurred vision     PATIENT EDUCATION: Education details: added to HEP; discussed upcoming MD visit to Dr. Arbutus Leas and discussion points regarding pt's PD symptoms (worsening tremor/internal tremor, dizziness reports, increased fear of falling) Person educated: Patient Education method: Explanation Education comprehension: verbalized understanding  HOME EXERCISE PROGRAM: -seated PWR! Moves (verbally added 03/19/2023) and walking at home, 2-3  minutes 3x/day with walking pole ------------------------------------------------------------ Objective measures below  taken at initial evaluation:  DIAGNOSTIC FINDINGS: NA for this episode  COGNITION: Overall cognitive status: Within functional limits for tasks assessed   SENSATION: Light touch: WFL  MUSCLE TONE: RLE: Mild  POSTURE: rounded shoulders and forward head  LOWER EXTREMITY ROM:   WFL in sitting  Active  Right Eval Left Eval  Hip flexion  pain  Hip extension    Hip abduction    Hip adduction    Hip internal rotation    Hip external rotation    Knee flexion    Knee extension    Ankle dorsiflexion    Ankle plantarflexion    Ankle inversion    Ankle eversion     (Blank rows = not tested)  LOWER EXTREMITY MMT:  Grossly tested in sitting  MMT Right Eval Left Eval  Hip flexion 4+ 4  Hip extension    Hip abduction    Hip adduction    Hip internal rotation    Hip external rotation    Knee flexion 4+ 4+  Knee extension 4+ 4+  Ankle dorsiflexion 4 4  Ankle plantarflexion    Ankle inversion    Ankle eversion    (Blank rows = not tested)  TRANSFERS: Assistive device utilized: None  Sit to stand: Modified independence Stand to sit: Modified independence  GAIT: Gait pattern: step through pattern, decreased arm swing- Right, decreased arm swing- Left, decreased stride length, and narrow BOS Distance walked: 100 ft Assistive device utilized:  single walking pole and None Level of assistance: Modified independence Comments: Guarded gait pattern  FUNCTIONAL TESTS:  5 times sit to stand: 12.31 sec Timed up and go (TUG): 14.34 sec 10 M:  12.1 sec = 2.7 ft/sec MiniBESTest: 22/28  TUG cognitive:14.56 sec  TUG manual:  14.62 sec  Full 360 turn:  8 steps  74M walk backwards:  16.10 sec = 0.62 ft/sec   M-CTSIB  Condition 1: Firm Surface, EO 30 Sec, Normal Sway  Condition 2: Firm Surface, EC 30 Sec, Mild Sway  Condition 3: Foam Surface, EO 30 Sec, Mild  Sway  Condition 4: Foam Surface, EC 29.69 Sec, Moderate and Severe Sway    PATIENT SURVEYS:  ABC scale 78.75 (walking across parking lot is 50%, stepping off escalator is 60%  TODAY'S TREATMENT:                                                                                                                              DATE: 03/06/2023    PATIENT EDUCATION: Education details: Eval results, POC Person educated: Patient Education method: Explanation Education comprehension: verbalized understanding  HOME EXERCISE PROGRAM: Not yet initiated   GOALS: Goals reviewed with patient? Yes  SHORT TERM GOALS: Target date: 04/06/2023  Pt will be independent with HEP for improved balance, gait, strength. Baseline: Goal status: IN PROGRESS  2.  Pt will improve 5x sit<>stand to less than or equal to 11 sec to demonstrate improved functional strength and  transfer efficiency. Baseline: 12.31 sec Goal status: IN PROGRESS  3.  Pt will improve 71M walk backwards to at least 1 ft/sec for improved balance. Baseline: 0.62 ft/sec  Goal status: IN PROGRESS  4.  Pt will improve MCTSIB Condition 4 to minimal sway for improved balance.  Baseline:  Goal status:IN PROGRESS  LONG TERM GOALS: Target date: 04/20/2023  Pt will be Independent with HEP for improved balance, strength, gait. Baseline:  Goal status: IN PROGRESS  2.  Pt will improve MiniBESTest score to at least 24/28 to decrease fall risk. Baseline: 22/28 Goal status: IN PROGRESS  3.  Pt will improve ABC score to at least 85% for improved overall balance confidence  Baseline: 78.75% Goal status: IN PROGRESS  4.  Pt will report improved confidence in curb, outdoor surface negotiation by at least 50% for improved community mobility. Baseline:  Goal status: IN PROGRESS   ASSESSMENT:  CLINICAL IMPRESSION: Pt presents today with reports that she may want to hold off therapy after today, until she feels better.  She is having increased  tremors, including increased internal tremors, which is causing difficulty with ADLs and cooking/household activities.  She reports increased knee pain in L knee, and she did follow up and have cortisone injection in L knee today.  (Per pt report, she can perform activities to tolerance and avoid pain for now).  Based on pt's reports of dizziness upon standing at times, we looked at orthostatic BP measures; however, blood pressure did not drop.  Her BP measures were higher than normal per report.  Advised pt in gentle HEP of walking and seated PWR! Moves until she can see neurologist and discuss her overall symptoms and functional mobility.  OBJECTIVE IMPAIRMENTS: Abnormal gait, decreased balance, decreased mobility, difficulty walking, decreased strength, and postural dysfunction.   ACTIVITY LIMITATIONS: standing, stairs, transfers, locomotion level, and caring for others  PARTICIPATION LIMITATIONS: shopping and community activity  PERSONAL FACTORS: 3+ comorbidities: see PMH above  are also affecting patient's functional outcome.   REHAB POTENTIAL: Good  CLINICAL DECISION MAKING: Evolving/moderate complexity  EVALUATION COMPLEXITY: Moderate  PLAN:  PT FREQUENCY: 2x/week  PT DURATION: 6 weeks plus eval week  PLANNED INTERVENTIONS: Therapeutic exercises, Therapeutic activity, Neuromuscular re-education, Balance training, Gait training, Patient/Family education, and Self Care  PLAN FOR NEXT SESSION: Pt may want to go on hold for PT after MD visit; work on progressing HEP for balance-weightshifting, EO and EC foam surfaces, SLS, curb and stair negotiation, unlevel surfaces   Lonia Blood, PT 03/19/23 5:12 PM Phone: [phone] Fax: [phone]   [facility] at [facility] [address], [address] [place], [place] [address] Phone # [phone] Fax # [phone]

## 2023-03-19 NOTE — Patient Instructions (Addendum)
Discussion points for Dr. Arbutus Leas:  -Tremors all over, noticing tremors inside/internal (2 weeks)  -Feelings of dizziness-with standing  -Fearful of falling  -Excess sweating  -Everything feels slow  -Not sleeping well (3-5 hours)  -?Concerns about the stomach not emptying

## 2023-03-20 ENCOUNTER — Encounter: Payer: Self-pay | Admitting: Neurology

## 2023-03-20 ENCOUNTER — Ambulatory Visit (INDEPENDENT_AMBULATORY_CARE_PROVIDER_SITE_OTHER): Payer: Medicare Other | Admitting: Neurology

## 2023-03-20 VITALS — BP 124/82 | HR 98 | Ht 63.0 in | Wt 238.2 lb

## 2023-03-20 DIAGNOSIS — G20A1 Parkinson's disease without dyskinesia, without mention of fluctuations: Secondary | ICD-10-CM | POA: Diagnosis not present

## 2023-03-20 DIAGNOSIS — G20B1 Parkinson's disease with dyskinesia, without mention of fluctuations: Secondary | ICD-10-CM

## 2023-03-20 DIAGNOSIS — R42 Dizziness and giddiness: Secondary | ICD-10-CM

## 2023-03-20 DIAGNOSIS — F419 Anxiety disorder, unspecified: Secondary | ICD-10-CM | POA: Diagnosis not present

## 2023-03-20 MED ORDER — CARBIDOPA-LEVODOPA ER 50-200 MG PO TBCR: EXTENDED_RELEASE_TABLET | ORAL | 1 refills | Status: DC

## 2023-03-20 MED ORDER — CARBIDOPA-LEVODOPA 25-100 MG PO TABS: ORAL_TABLET | ORAL | 2 refills | Status: DC

## 2023-03-20 MED ORDER — PRAMIPEXOLE DIHYDROCHLORIDE 0.5 MG PO TABS: 0.5000 mg | ORAL_TABLET | Freq: Three times a day (TID) | ORAL | 1 refills | Status: DC

## 2023-03-20 MED ORDER — ENTACAPONE 200 MG PO TABS: 200.0000 mg | ORAL_TABLET | Freq: Three times a day (TID) | ORAL | 1 refills | Status: DC

## 2023-03-20 NOTE — Patient Instructions (Addendum)
SAVE THE DATE!  We are planning a Parkinsons Disease educational symposium at [facility] in [place] on October 11.  More details to come!  If you would like to be added to our email list to get further information, email [email or web address].  We hope to see you there!  I will refer you to Dr. Neysa Hotter, psychiatry   I want you to check your BP at home in various positions between now and your appt with Dr. Duke Salvia.

## 2023-03-21 ENCOUNTER — Ambulatory Visit: Payer: Medicare Other | Admitting: Physical Therapy

## 2023-03-22 ENCOUNTER — Emergency Department (HOSPITAL_BASED_OUTPATIENT_CLINIC_OR_DEPARTMENT_OTHER)
Admission: EM | Admit: 2023-03-22 | Discharge: 2023-03-22 | Disposition: A | Discharge status: Home / Self Care | Payer: Medicare Other | Attending: Emergency Medicine | Admitting: Emergency Medicine

## 2023-03-22 ENCOUNTER — Telehealth: Payer: Self-pay | Admitting: Family Medicine

## 2023-03-22 ENCOUNTER — Encounter (HOSPITAL_BASED_OUTPATIENT_CLINIC_OR_DEPARTMENT_OTHER): Payer: Self-pay

## 2023-03-22 ENCOUNTER — Other Ambulatory Visit: Payer: Self-pay

## 2023-03-22 DIAGNOSIS — I10 Essential (primary) hypertension: Secondary | ICD-10-CM | POA: Insufficient documentation

## 2023-03-22 DIAGNOSIS — Z79899 Other long term (current) drug therapy: Secondary | ICD-10-CM | POA: Insufficient documentation

## 2023-03-22 DIAGNOSIS — R42 Dizziness and giddiness: Secondary | ICD-10-CM | POA: Diagnosis not present

## 2023-03-22 LAB — CBC
HCT: 36.7 % (ref 36.0–46.0)
Hemoglobin: 12.1 g/dL (ref 12.0–15.0)
MCH: 29.7 pg (ref 26.0–34.0)
MCHC: 33 g/dL (ref 30.0–36.0)
MCV: 90.2 fL (ref 80.0–100.0)
Platelets: 150 10*3/uL (ref 150–400)
RBC: 4.07 MIL/uL (ref 3.87–5.11)
RDW: 14.2 % (ref 11.5–15.5)
WBC: 5.3 10*3/uL (ref 4.0–10.5)
nRBC: 0 % (ref 0.0–0.2)

## 2023-03-22 LAB — BASIC METABOLIC PANEL
Anion gap: 9 (ref 5–15)
BUN: 19 mg/dL (ref 8–23)
CO2: 26 mmol/L (ref 22–32)
Calcium: 9.7 mg/dL (ref 8.9–10.3)
Chloride: 106 mmol/L (ref 98–111)
Creatinine, Ser: 0.7 mg/dL (ref 0.44–1.00)
GFR, Estimated: >60 mL/min (ref >60)
Glucose, Bld: 103 mg/dL — ABNORMAL HIGH (ref 70–99)
Potassium: 3.9 mmol/L (ref 3.5–5.1)
Sodium: 141 mmol/L (ref 135–145)

## 2023-03-22 NOTE — ED Triage Notes (Signed)
Patient here POV from [facility].  Endorses gradual dizziness for 1 month. States it has become worse recently. No N/V/D. No Known Fevers. No Pain. No SOB.  States she got a Cortisone Injection on Monday and states since then her BP and HR have been elevated.   NAD Noted during triage. A&Ox4. GCS 15. Ambulatory.

## 2023-03-22 NOTE — Telephone Encounter (Signed)
Final Disposition: Go to ED Now   Patient Name First: Kristin Last: [name] Gender: Female DOB: 02/04/1951 Age: 72 Y 10 M 14 D Return Phone Number: [phone] (Primary), [phone] (Secondary) Address: City/ State/ Zip: [place]  [address] Client [facility] - Administrator, [facility] at [facility] Provider Tana Conch- MD Contact Type Call Who Is Calling Patient / Member / Family / Caregiver Call Type Triage / Clinical Relationship To Patient Self Return Phone Number [phone] (Primary) Chief Complaint Dizziness Reason for Call Symptomatic / Request for Health Information Initial Comment Caller is Bonita Quin with office transferring call caller is dizzy and light headed and imbalance. Caller states it seems to be getting worse and thinks it may have to do with her Parkinson's medication. Translation No Nurse Assessment Nurse: Suezanne Jacquet, RN, Riley Lam Date/Time (Eastern Time): 03/22/2023 10:12:45 AM Confirm and document reason for call. If symptomatic, describe symptoms. ---Hx of Parkinson's and her neurologist Tuesday added a new med but hasn't started it yet. Having some mild dizziness that has gotten worse today. Had a cortisone shot in the knee on Monday and increased her HR and BP 169/95 this morning and HR 80. Her normal hr is 70's after the shot was in the 100's but 80 today and her normal sbp is 120. Takes Losartan for bp. Does the patient have any new or worsening symptoms? ---Yes Will a triage be completed? ---Yes Related visit to physician within the last 2 weeks? ---Yes Does the PT have any chronic conditions? (i.e. diabetes, asthma, this includes High risk factors for pregnancy, etc.) ---Yes List chronic conditions. ---htn Is this a behavioral health or substance abuse call? ---No  Guidelines Guideline Title Affirmed Question Affirmed Notes Nurse Date/Time (Eastern Time) Blood Pressure  - High [1] Systolic BP >= 160 OR Diastolic >= 100 AND [2] cardiac (e.g., breathing difficulty, chest pain) or neurologic symptoms (e.g., new-onset blurred or double vision, unsteady gait) Suezanne Jacquet, RN, Riley Lam 03/22/2023 10:16:36 AM Disp. Time Lamount Cohen Time) Disposition Final User 03/22/2023 10:18:15 AM Go to ED Now Yes Suezanne Jacquet, RN, Riley Lam Final Disposition 03/22/2023 10:18:15 AM Go to ED Now Yes Suezanne Jacquet, RN, York Spaniel Disagree/Comply Comply Caller Understands Yes PreDisposition Call Doctor Care Advice Given Per Guideline GO TO ED NOW: * Leave now. Drive carefully. * Another adult should drive. CARE ADVICE given per High Blood Pressure (Adult) guideline. * Becomes confused * Passes out or faints CALL EMS 911 IF  Referrals [facility] - ED

## 2023-03-22 NOTE — ED Provider Notes (Signed)
Kristin [name] Provider Note   CSN: [id number] Arrival date & time: 03/22/23  1117     History  Chief Complaint  Patient presents with   Dizziness    Kristin [name] is a 72 y.o. female.  HPI 72 year old female presents today complaining of lightheadedness and high blood pressure.  Patient has known history of hypertension.  She has been taking her medications as prescribed.  She felt lightheaded and had orthostasis yesterday while at rehab.  At that time, they were concerned that her blood pressure was higher than normal.  She then took her blood pressure several times at home.  She recorded blood pressure yesterday at 530 of 138/83 with heart rate of 77 and then this morning at 8 AM it was 168/92 and then at 1030 is 174/98.  Her lightheadedness was not worse with this.  She does not describe any vertigo.  Taking the same medications as previously.    Home Medications Prior to Admission medications   Medication Sig Start Date End Date Taking? Authorizing Provider  ALPRAZolam (XANAX) 0.25 MG tablet TAKE 1 TABLET BY MOUTH 2 TIMES DAILY AS NEEDED FOR ANXIETY. 09/19/22   Shelva Majestic, MD  carbidopa-levodopa (SINEMET CR) 50-200 MG tablet TAKE 1 TABLET BY MOUTH EVERYDAY AT BEDTIME 03/20/23   Tat, Octaviano Batty, DO  carbidopa-levodopa (SINEMET IR) 25-100 MG tablet TAKE 2 TABLETS BY MOUTH AT 7AM, 2TABLETS AT 11AM, 1 TABLET AT 4PM 03/20/23   Tat, Octaviano Batty, DO  carvedilol (COREG) 3.125 MG tablet TAKE 1 TABLET BY MOUTH TWICE A DAY WITH MEALS 07/24/22   Shelva Majestic, MD  DULoxetine (CYMBALTA) 30 MG capsule TAKE 1 CAPSULE BY MOUTH EVERY DAY 03/12/23   Shelva Majestic, MD  entacapone (COMTAN) 200 MG tablet Take 1 tablet (200 mg total) by mouth 3 (three) times daily. 03/20/23   Tat, Octaviano Batty, DO  escitalopram (LEXAPRO) 10 MG tablet Take 1 tablet (10 mg total) by mouth daily. 09/27/22   Shelva Majestic, MD  folic acid (FOLVITE) 1 MG tablet Take 1 mg by  mouth 2 (two) times daily. 03/21/18   [provider]  losartan (COZAAR) 50 MG tablet TAKE 1 TABLET BY MOUTH EVERY DAY 08/18/22   Shelva Majestic, MD  omeprazole (PRILOSEC) 20 MG capsule TAKE 1 CAPSULE BY MOUTH 2 TIMES DAILY AS NEEDED. 11/27/22   Shelva Majestic, MD  polyethylene glycol (MIRALAX / GLYCOLAX) 17 g packet Take 17 g by mouth daily.    [provider]  pramipexole (MIRAPEX) 0.5 MG tablet Take 1 tablet (0.5 mg total) by mouth 3 (three) times daily. 03/20/23   Tat, Octaviano Batty, DO  rosuvastatin (CRESTOR) 10 MG tablet TAKE 1 TABLET BY MOUTH ON MONDAYS,WEDNESDAYS, AND FRIDAYS Patient taking differently: TAKE 1 TABLET BY MOUTH pt takes on Sundays, Tuesdays and Thursdays 04/17/22   Shelva Majestic, MD  TRULANCE 3 MG TABS TAKE 1 TABLET BY MOUTH EVERY DAY 02/13/23   Napoleon Form, MD  Vitamin D, Ergocalciferol, (DRISDOL) 1.25 MG (50000 UNIT) CAPS capsule TAKE 1 CAPSULE (50,000 UNITS TOTAL) BY MOUTH EVERY 7 (SEVEN) DAYS 01/30/23   Shelva Majestic, MD      Allergies    Scopolamine, Prednisone, Bactrim [sulfamethoxazole-trimethoprim], Penicillins, and Sulfa antibiotics    Review of Systems   Review of Systems  Physical Exam Updated Vital Signs BP 128/66   Pulse 79   Temp 98.1 F (36.7 C) (Oral)   Resp 15  SpO2 98%  Physical Exam Vitals and nursing note reviewed.  Constitutional:      Appearance: She is normal weight.  HENT:     Head: Normocephalic.     Right Ear: External ear normal.     Left Ear: External ear normal.     Nose: Nose normal.     Mouth/Throat:     Pharynx: Oropharynx is clear.  Eyes:     Pupils: Pupils are equal, round, and reactive to light.  Cardiovascular:     Rate and Rhythm: Normal rate and regular rhythm.     Pulses: Normal pulses.  Pulmonary:     Effort: Pulmonary effort is normal.  Abdominal:     General: Abdomen is flat. Bowel sounds are normal.  Musculoskeletal:        General: Normal range of motion.     Cervical back:  Normal range of motion and neck supple.  Skin:    General: Skin is warm and dry.     Capillary Refill: Capillary refill takes less than 2 seconds.  Neurological:     General: No focal deficit present.     Mental Status: She is alert.     Cranial Nerves: No cranial nerve deficit.     Sensory: No sensory deficit.     Motor: No weakness.     Coordination: Coordination normal.  Psychiatric:        Mood and Affect: Mood normal.     ED Results / Procedures / Treatments   Labs (all labs ordered are listed, but only abnormal results are displayed) Labs Reviewed  BASIC METABOLIC PANEL - Abnormal; Notable for the following components:      Result Value   Glucose, Bld 103 (*)    All other components within normal limits  CBC    EKG EKG Interpretation Date/Time:  Thursday March 22 2023 11:29:50 EDT Ventricular Rate:  70 PR Interval:  178 QRS Duration:  98 QT Interval:  398 QTC Calculation: 430 R Axis:   -42  Text Interpretation: Sinus rhythm Left atrial enlargement Left anterior fascicular block Consider anterior infarct Confirmed by Margarita Grizzle [phone]) on 03/22/2023 11:55:11 AM  Radiology No results found.  Procedures Procedures    Medications Ordered in ED Medications - No data to display  ED Course/ Medical Decision Making/ A&P Clinical Course as of 03/22/23 1304  Thu Mar 22, 2023  1255 CBC reviewed interpreted within normal limits Basic metabolic panel reviewed interpreted within normal limits [DR]    Clinical Course User Index [DR] Margarita Grizzle, MD                             Medical Decision Making Amount and/or Complexity of Data Reviewed Labs: ordered.   72 year old female presents today complaining of lightheadedness.  Patient's symptoms consistent with some orthostatic changes.  She is also concerned regarding her hypertension.  She has started taking her blood pressure due to these concerns.  Is been somewhat elevated.  Here in the ED without any  intervention blood pressure is now 128/66.  She has Discussed that her blood pressure may be somewhat more labile as it has been felt to decrease due to her Parkinson's.  She has continued her Parkinson's treatment.  She was evaluated here with labs that appear to be within normal limits EKG was reviewed and interpreted and is within normal limits and unchanged from her prior.  Here in the ED she was maintained  on the monitor and her pain blood pressure has decreased to 128/66 without any intervention. Discussed multiple etiologies of having an elevated blood pressure including fluctuations due to medication, increased blood pressure that often occurs in the stress of the medical setting, and stroke.  Based on the symptoms it sounds like she was sent here due to concerns for stroke.  However in discussion with her I have a low index of suspicion for stroke.  She denies any vertigo and describes a lightheadedness. She is not having any chest pain and EKG shows no acute ischemia. Patient is advised of return precautions and need for follow-up and voices understanding.        Final Clinical Impression(s) / ED Diagnoses Final diagnoses:  Lightheadedness  Hypertension, unspecified type    Rx / DC Orders ED Discharge Orders     None         Margarita Grizzle, MD 03/22/23 1304

## 2023-03-22 NOTE — Discharge Instructions (Addendum)
Continue to drink plenty of fluids You your home blood pressure medications. Follow-up with your primary care doctor Return here if you are having any new or worsening symptoms.

## 2023-03-22 NOTE — Telephone Encounter (Signed)
FYI: This call has been transferred to triage nurse: the Triage Nurse. Once the result note has been entered staff can address the message at that time.  Patient called in with the following symptoms:  Red Word:dizziness , lightheaded, imbalanced   Please advise at Mobile [phone] (mobile)  Message is routed to Provider Pool.

## 2023-03-22 NOTE — Telephone Encounter (Signed)
Likely could have seen her in office for this but appears already in the Emergency Department by time I had to review this

## 2023-03-22 NOTE — Telephone Encounter (Signed)
FYI

## 2023-03-27 ENCOUNTER — Ambulatory Visit: Payer: Medicare Other | Admitting: Physical Therapy

## 2023-03-27 DIAGNOSIS — K5900 Constipation, unspecified: Secondary | ICD-10-CM | POA: Diagnosis not present

## 2023-03-27 DIAGNOSIS — N3941 Urge incontinence: Secondary | ICD-10-CM | POA: Diagnosis not present

## 2023-03-27 DIAGNOSIS — M6289 Other specified disorders of muscle: Secondary | ICD-10-CM | POA: Diagnosis not present

## 2023-03-27 DIAGNOSIS — M6281 Muscle weakness (generalized): Secondary | ICD-10-CM | POA: Diagnosis not present

## 2023-03-28 ENCOUNTER — Ambulatory Visit (INDEPENDENT_AMBULATORY_CARE_PROVIDER_SITE_OTHER): Payer: Medicare Other | Admitting: Family Medicine

## 2023-03-28 ENCOUNTER — Other Ambulatory Visit: Payer: Self-pay | Admitting: *Deleted

## 2023-03-28 ENCOUNTER — Encounter: Payer: Self-pay | Admitting: Family Medicine

## 2023-03-28 VITALS — BP 120/64 | HR 78 | Temp 98.4°F | Ht 63.0 in | Wt 234.0 lb

## 2023-03-28 DIAGNOSIS — E782 Mixed hyperlipidemia: Secondary | ICD-10-CM

## 2023-03-28 DIAGNOSIS — E559 Vitamin D deficiency, unspecified: Secondary | ICD-10-CM | POA: Diagnosis not present

## 2023-03-28 DIAGNOSIS — G20B2 Parkinson's disease with dyskinesia, with fluctuations: Secondary | ICD-10-CM | POA: Diagnosis not present

## 2023-03-28 DIAGNOSIS — I10 Essential (primary) hypertension: Secondary | ICD-10-CM

## 2023-03-28 DIAGNOSIS — Z131 Encounter for screening for diabetes mellitus: Secondary | ICD-10-CM | POA: Diagnosis not present

## 2023-03-28 DIAGNOSIS — E538 Deficiency of other specified B group vitamins: Secondary | ICD-10-CM

## 2023-03-28 DIAGNOSIS — R7303 Prediabetes: Secondary | ICD-10-CM

## 2023-03-28 LAB — COMPREHENSIVE METABOLIC PANEL
ALT: 8 U/L (ref 0–35)
AST: 21 U/L (ref 0–37)
Albumin: 4.2 g/dL (ref 3.5–5.2)
Alkaline Phosphatase: 71 U/L (ref 39–117)
BUN: 17 mg/dL (ref 6–23)
CO2: 25 mEq/L (ref 19–32)
Calcium: 9.7 mg/dL (ref 8.4–10.5)
Chloride: 105 mEq/L (ref 96–112)
Creatinine, Ser: 0.64 mg/dL (ref 0.40–1.20)
GFR: 88 mL/min (ref >60.00)
Glucose, Bld: 125 mg/dL — ABNORMAL HIGH (ref 70–99)
Potassium: 3.9 mEq/L (ref 3.5–5.1)
Sodium: 140 mEq/L (ref 135–145)
Total Bilirubin: 0.7 mg/dL (ref 0.2–1.2)
Total Protein: 7.1 g/dL (ref 6.0–8.3)

## 2023-03-28 LAB — CBC WITH DIFFERENTIAL/PLATELET
Basophils Absolute: 0 10*3/uL (ref 0.0–0.1)
Basophils Relative: 0.5 % (ref 0.0–3.0)
Eosinophils Absolute: 0.1 10*3/uL (ref 0.0–0.7)
Eosinophils Relative: 1.3 % (ref 0.0–5.0)
HCT: 39.3 % (ref 36.0–46.0)
Hemoglobin: 12.6 g/dL (ref 12.0–15.0)
Lymphocytes Relative: 25.8 % (ref 12.0–46.0)
Lymphs Abs: 1.5 10*3/uL (ref 0.7–4.0)
MCHC: 32.1 g/dL (ref 30.0–36.0)
MCV: 91.2 fl (ref 78.0–100.0)
Monocytes Absolute: 0.6 10*3/uL (ref 0.1–1.0)
Monocytes Relative: 9.7 % (ref 3.0–12.0)
Neutro Abs: 3.6 10*3/uL (ref 1.4–7.7)
Neutrophils Relative %: 62.7 % (ref 43.0–77.0)
Platelets: 220 10*3/uL (ref 150.0–400.0)
RBC: 4.31 Mil/uL (ref 3.87–5.11)
RDW: 14.8 % (ref 11.5–15.5)
WBC: 5.7 10*3/uL (ref 4.0–10.5)

## 2023-03-28 LAB — LIPID PANEL
Cholesterol: 147 mg/dL (ref 0–200)
HDL: 63.8 mg/dL (ref >39.00)
LDL Cholesterol: 60 mg/dL (ref 0–99)
NonHDL: 82.93
Total CHOL/HDL Ratio: 2
Triglycerides: 114 mg/dL (ref 0.0–149.0)
VLDL: 22.8 mg/dL (ref 0.0–40.0)

## 2023-03-28 LAB — VITAMIN D 25 HYDROXY (VIT D DEFICIENCY, FRACTURES): VITD: 38.55 ng/mL (ref 30.00–100.00)

## 2023-03-28 LAB — VITAMIN B12: Vitamin B-12: 241 pg/mL (ref 211–911)

## 2023-03-28 LAB — HEMOGLOBIN A1C: Hgb A1c MFr Bld: 6 % (ref 4.6–6.5)

## 2023-03-28 MED ORDER — ESCITALOPRAM OXALATE 20 MG PO TABS: 20.0000 mg | ORAL_TABLET | Freq: Every day | ORAL | 3 refills | Status: DC

## 2023-03-28 NOTE — Patient Instructions (Addendum)
depression has worsened after reducing Lexapro to 10 mg as well as add on of some life events that caused reflection back to childhood which has been had for her- we opted to go back up to 20 mg, continue Cymbalta and she has visit with Dr. Vanetta Shawl in October- I'm also happy to try to see her back in about 6 weeks if shed like  Slight serotonin syndrome risk- see attachments  Please stop by lab before you go If you have mychart- we will send your results within 3 business days of [place] receiving them.  If you do not have mychart- we will call you about results within 5 business days of [place] receiving them.  *please also note that you will see labs on mychart as soon as they post. I will later go in and write notes on them- will say "notes from Dr. Durene Cal"   Recommended follow up: Return in about 6 weeks (around 05/09/2023) for followup or sooner if needed.Schedule b4 you leave.

## 2023-03-28 NOTE — Progress Notes (Signed)
Phone [phone] In person visit   Subjective:   Kristin [name] is a 72 y.o. year old very pleasant female patient who presents for/with See problem oriented charting Chief Complaint  Patient presents with   Follow-up    She complain of some dizziness. She has hard time sleeping, she has an appointment with psychiatrist to discuss medication.  She want to have some labs.    Past Medical History-  Patient Active Problem List   Diagnosis Date Noted   Coronary artery disease involving native coronary artery of native heart without angina pectoris 06/12/2019    Priority: High   Rheumatoid arthritis (HCC) 01/08/2018    Priority: High   Constipation 12/07/2015    Priority: High   Parkinson's disease (HCC) 05/11/2014    Priority: High   Vitamin B 12 deficiency 05/30/2021    Priority: Medium    Vitamin D deficiency 12/20/2016    Priority: Medium    Prediabetes 12/20/2016    Priority: Medium    Shortness of breath on exertion 11/27/2016    Priority: Medium    Morbid obesity (HCC)     Priority: Medium    Depression with anxiety 11/02/2009    Priority: Medium    Essential hypertension 07/05/2007    Priority: Medium    Osteoarthritis 07/05/2007    Priority: Medium    Hyperlipidemia 06/26/2007    Priority: Medium    H/O: iron deficiency anemia 05/08/2007    Priority: Medium    Hiatal hernia with GERD 05/08/2007    Priority: Medium    Anemia 03/16/2020    Priority: Low   Diastolic dysfunction 05/16/2019    Priority: Low   BCC (basal cell carcinoma of skin) 10/05/2014    Priority: Low   Rosacea 10/05/2014    Priority: Low   Screening for cervical cancer 07/17/2012    Priority: Low   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   Foot pain, bilateral 06/19/2012    Priority: 1.   Lumbar back pain with radiculopathy affecting lower extremity 10/17/2007     Priority: 1.    Medications- reviewed and updated Current Outpatient Medications  Medication Sig Dispense Refill   ALPRAZolam (XANAX) 0.25 MG tablet TAKE 1 TABLET BY MOUTH 2 TIMES DAILY AS NEEDED FOR ANXIETY. 30 tablet 1   carbidopa-levodopa (SINEMET CR) 50-200 MG tablet TAKE 1 TABLET BY MOUTH EVERYDAY AT BEDTIME 90 tablet 1   carbidopa-levodopa (SINEMET IR) 25-100 MG tablet TAKE 2 TABLETS BY MOUTH AT 7AM, 2TABLETS AT 11AM, 1 TABLET AT 4PM 450 tablet 2   carvedilol (COREG) 3.125 MG tablet TAKE 1 TABLET BY MOUTH TWICE A DAY WITH MEALS 180 tablet 3   DULoxetine (CYMBALTA) 30 MG capsule TAKE 1 CAPSULE BY MOUTH EVERY DAY 90 capsule 1   entacapone (COMTAN) 200 MG tablet Take 1 tablet (200 mg total) by mouth 3 (three) times daily. 270 tablet 1   folic acid (FOLVITE) 1 MG tablet Take 1 mg by mouth 2 (two) times daily.     losartan (COZAAR) 50 MG tablet TAKE 1 TABLET BY MOUTH EVERY DAY 90 tablet 3   omeprazole (PRILOSEC) 20 MG capsule TAKE 1 CAPSULE BY MOUTH 2 TIMES DAILY AS NEEDED. 180 capsule 1   polyethylene glycol (MIRALAX / GLYCOLAX) 17 g packet Take 17 g by mouth daily.     pramipexole (MIRAPEX) 0.5 MG  tablet Take 1 tablet (0.5 mg total) by mouth 3 (three) times daily. 270 tablet 1   rosuvastatin (CRESTOR) 10 MG tablet TAKE 1 TABLET BY MOUTH ON MONDAYS,WEDNESDAYS, AND FRIDAYS (Patient taking differently: TAKE 1 TABLET BY MOUTH pt takes on Sundays, Tuesdays and Thursdays) 39 tablet 3   TRULANCE 3 MG TABS TAKE 1 TABLET BY MOUTH EVERY DAY 30 tablet 1   Vitamin D, Ergocalciferol, (DRISDOL) 1.25 MG (50000 UNIT) CAPS capsule TAKE 1 CAPSULE (50,000 UNITS TOTAL) BY MOUTH EVERY 7 (SEVEN) DAYS 13 capsule 1   escitalopram (LEXAPRO) 20 MG tablet Take 1 tablet (20 mg total) by mouth daily. 90 tablet 3   No current facility-administered medications for this visit.     Objective:  BP 120/64   Pulse 78   Temp 98.4 F (36.9 C)   Ht 5\' 3"  (1.6 m)   Wt 234 lb (106.1 kg)   SpO2 96%   BMI 41.45 kg/m   Gen: NAD, resting comfortably CV: RRR no murmurs rubs or gallops Lungs: CTAB no crackles, wheeze, rhonchi Ext: trace edema Skin: warm, dry Neuro: walks with cane    Assessment and Plan   #Parkinson's disease with RLS-Follows with Dr. Arbutus Leas  S: Medication: Carbidopa levodopa (Sinemet dose sustained-release and instant release). Also on mirapex 0.5 mg TID (increased ocd tendencies and shoping so trying to cut back)-she has noted some dizziness. Seen in Emergency Department 03/22/23 after BP was high along with dizziness but trended back down. No chest pain and reassuring EKG -saw Dr. Arbutus Leas 7/16.24- entacapone 200 mg (supposed to give extra hour of "on time") with levodopa was added. Dr tat was aware of dizziness and thought mild neurogenic orthostatic hypotension potentially A/P: Parkinson's disease overall stable- some benefit form entacapone - less tremulousness and more "on time" for medications -dizziness likely mild neurogenic orthostatic hypotension as per Dr. Arbutus Leas- I think Ed visit came from being anxious about blood pressure elevations and as below- blood pressure much better  #hypertension S: medication: Losartan 50 mg, coreg 3.125 mg BID BP Readings from Last 3 Encounters:  03/28/23 120/64  03/22/23 (!) 118/92  03/20/23 124/82  A/P: stable- continue current medicines    #CAD - minimal plaque on cath 2020 #hyperlipidemia S: Medication:Rosuvastatin 3 days a week, coreg 3.125 mg twice daily - no chest pain or shortness of breath reported Lab Results  Component Value Date   CHOL 149 01/12/2022   HDL 64.90 01/12/2022   LDLCALC 58 01/12/2022   LDLDIRECT 161.5 10/07/2008   TRIG 134.0 01/12/2022   CHOLHDL 2 01/12/2022  A/P: coronary artery disease asymptomatic continue current medications Lipids hopefully stable- update lipid panel today. Continue current meds for now    # Depression with anxiety- family history and mother bipolar as well S: Medication:Lexapro 20 mg--> 10 mg at  last visit, Cymbalta 30 mg, sparing alprazolam 0.25 mg -has upcoming psychiatry visit as reports hard time sleeping- Dr. Vanetta Shawl in October -had a beach trip that was little different than normal where a lot of things from childhood seemed to come back up- she doesn't have a lot of memories form childhood and its been frustrating for her- felt very weepy since getting back home -had referred to Dr. Monna Fam last visit- she checked in to be seen- waited for an hour to be seen and found out she had not been there that day- apparently she had received email that she had missed and it was about doing virtual visit as Dr. Monna Fam was working from  home- she ended up cancelling all her visits.  -she ended up having a visit with a virtual therapist -mom was bipolar and father was very mean and she feels like she is making some progress with understanding disease    03/28/2023    8:50 AM 03/06/2023   11:13 AM 01/23/2023   11:07 AM  Depression screen PHQ 2/9  Decreased Interest 1 0 0  Down, Depressed, Hopeless 3 0 0  PHQ - 2 Score 4 0 0  Altered sleeping 2 0 0  Tired, decreased energy 2 0 0  Change in appetite 0 0 0  Feeling bad or failure about yourself  2 0 0  Trouble concentrating 2 0 0  Moving slowly or fidgety/restless 2 0 0  Suicidal thoughts 1 0 0  PHQ-9 Score 15 0 0  Difficult doing work/chores  Not difficult at all Not difficult at all  A/P: depression has worsened after reducing Lexapro to 10 mg as well as add on of some life events that caused reflection back to childhood which has been had for her- we opted to go back up to 20 mg, continue Cymbalta and she has visit with Dr. Vanetta Shawl in October- I'm also happy to try to see her back in about 6 weeks if shed like  #Constipation- 4 sisters have this as well. Recent transition to trulance seems helpful.  - back to pelvic floor physical therapy again  #gastroparesis- limited diet recommended by Dr. Lavon Paganini but has been difficult for her to  follow. Endoscopy 10/06/22 was not able to be completed.   #Vitamin D deficiency S: Medication: 50,000 units weekly for 26 weeks after January visit-currently on high dose Last vitamin D Lab Results  Component Value Date   VD25OH 19.47 (L) 09/27/2022  A/P: update D today- may be able to transition to daily   # B12 deficiency- update levels today- did not confirm her current dose today  # Hyperglycemia/insulin resistance/prediabetes- a1c 6.0 in 2023 S:  Medication: none -did have recent steroid shot Exercise and diet- mild weight loss Lab Results  Component Value Date   HGBA1C 6.1 09/27/2022   HGBA1C 5.9 01/12/2022   HGBA1C 6.0 10/03/2021  A/P: hopefully stable- update a1c today. Continue without meds for now . I think improving mental health may help her in ability to make lifestyle changes- she is very intentional about this   Recommended follow up: Return in about 6 weeks (around 05/09/2023) for followup or sooner if needed.Schedule b4 you leave. Future Appointments  Date Time Provider Department Center  05/30/2023  9:30 AM Napoleon Form, MD [facility]  06/04/2023  8:00 AM Chilton Si, MD [facility]  06/05/2023  1:30 PM Neysa Hotter, MD [facility] None  09/20/2023  8:45 AM Tat, Octaviano Batty, DO [facility] None  03/13/2024 10:45 AM [facility] ANNUAL WELLNESS VISIT 1 [facility]    Lab/Order associations:   ICD-10-CM   1. Parkinson's disease with dyskinesia and fluctuating manifestations  G20.B2     2. Essential hypertension  I10     3. Mixed hyperlipidemia  E78.2 Comprehensive metabolic panel    CBC with Differential/Platelet    Lipid panel    4. Vitamin D deficiency  E55.9 VITAMIN D 25 Hydroxy (Vit-D Deficiency, Fractures)    5. Vitamin B 12 deficiency  E53.8 Vitamin B12    6. Prediabetes  R73.03 Hemoglobin A1c    7. Screening for diabetes mellitus  Z13.1 Hemoglobin A1c      Meds ordered this encounter  Medications   escitalopram (LEXAPRO) 20 MG tablet     Sig: Take 1 tablet (20 mg total) by mouth daily.    Dispense:  90 tablet    Refill:  3    Return precautions advised.  Tana Conch, MD

## 2023-03-29 ENCOUNTER — Encounter: Payer: Self-pay | Admitting: Family Medicine

## 2023-03-29 ENCOUNTER — Telehealth: Payer: Self-pay

## 2023-03-29 ENCOUNTER — Ambulatory Visit: Payer: Medicare Other | Admitting: Physical Therapy

## 2023-03-29 NOTE — Telephone Encounter (Signed)
Transition Care Management Follow-up Telephone Call Date of discharge and from where: 03/22/2023 [facility] How have you been since you were released from the hospital? Patient stated she is feeling better. Any questions or concerns? No  Items Reviewed: Did the pt receive and understand the discharge instructions provided? Yes  Medications obtained and verified?  No medication prescribed. Other? No  Any new allergies since your discharge? No  Dietary orders reviewed? Yes Do you have support at home? Yes   Follow up appointments reviewed:  PCP Hospital f/u appt confirmed? Yes  Scheduled to see Aldine Contes. Durene Cal, MD on 03/28/2023 @  [facility] f/u appt confirmed? No  Scheduled to see  on  @ . Are transportation arrangements needed? No  If their condition worsens, is the pt aware to call PCP or go to the Emergency Dept.? Yes Was the patient provided with contact information for the PCP's office or ED? Yes Was to pt encouraged to call back with questions or concerns? Yes  Maeleigh Buschman [facility]  [facility] Community Resource Care Guide   ??[email or web address]  ?? [phone]   [email or web address]: [email or web address]  [email or web address]

## 2023-04-02 ENCOUNTER — Ambulatory Visit: Payer: Medicare Other | Admitting: Physical Therapy

## 2023-04-02 DIAGNOSIS — H25013 Cortical age-related cataract, bilateral: Secondary | ICD-10-CM | POA: Diagnosis not present

## 2023-04-02 DIAGNOSIS — H524 Presbyopia: Secondary | ICD-10-CM | POA: Diagnosis not present

## 2023-04-02 DIAGNOSIS — H2513 Age-related nuclear cataract, bilateral: Secondary | ICD-10-CM | POA: Diagnosis not present

## 2023-04-02 DIAGNOSIS — Z79899 Other long term (current) drug therapy: Secondary | ICD-10-CM | POA: Diagnosis not present

## 2023-04-02 DIAGNOSIS — H5203 Hypermetropia, bilateral: Secondary | ICD-10-CM | POA: Diagnosis not present

## 2023-04-04 ENCOUNTER — Ambulatory Visit: Payer: Medicare Other | Admitting: Physical Therapy

## 2023-04-09 ENCOUNTER — Other Ambulatory Visit: Payer: Self-pay | Admitting: Gastroenterology

## 2023-04-09 ENCOUNTER — Other Ambulatory Visit: Payer: Self-pay | Admitting: Family Medicine

## 2023-04-12 DIAGNOSIS — M6281 Muscle weakness (generalized): Secondary | ICD-10-CM | POA: Diagnosis not present

## 2023-04-12 DIAGNOSIS — M6289 Other specified disorders of muscle: Secondary | ICD-10-CM | POA: Diagnosis not present

## 2023-04-12 DIAGNOSIS — M62838 Other muscle spasm: Secondary | ICD-10-CM | POA: Diagnosis not present

## 2023-04-12 DIAGNOSIS — N3941 Urge incontinence: Secondary | ICD-10-CM | POA: Diagnosis not present

## 2023-04-19 ENCOUNTER — Encounter (INDEPENDENT_AMBULATORY_CARE_PROVIDER_SITE_OTHER): Payer: Self-pay

## 2023-04-25 ENCOUNTER — Encounter: Payer: Self-pay | Admitting: Physical Therapy

## 2023-04-25 ENCOUNTER — Ambulatory Visit: Payer: Medicare Other | Attending: Neurology | Admitting: Physical Therapy

## 2023-04-25 DIAGNOSIS — R293 Abnormal posture: Secondary | ICD-10-CM | POA: Insufficient documentation

## 2023-04-25 DIAGNOSIS — R29818 Other symptoms and signs involving the nervous system: Secondary | ICD-10-CM | POA: Diagnosis not present

## 2023-04-25 DIAGNOSIS — R2681 Unsteadiness on feet: Secondary | ICD-10-CM | POA: Diagnosis not present

## 2023-04-25 DIAGNOSIS — R2689 Other abnormalities of gait and mobility: Secondary | ICD-10-CM | POA: Insufficient documentation

## 2023-04-25 DIAGNOSIS — M6281 Muscle weakness (generalized): Secondary | ICD-10-CM | POA: Insufficient documentation

## 2023-04-25 NOTE — Therapy (Signed)
OUTPATIENT PHYSICAL THERAPY NEURO TREATMENT/RECERT   Patient Name: Kristin [name] MRN: [id number] DOB:Sep 16, 1950, 72 y.o., female Today's Date: 04/25/2023   PCP: Shelva Majestic, MD REFERRING PROVIDER: Vladimir Faster, DO   END OF SESSION:  PT End of Session - 04/25/23 0757     Visit Number 4    Number of Visits 16    Date for PT Re-Evaluation 06/08/23    Authorization Type Medicare/BCBS    PT Start Time 0804    PT Stop Time 0850    PT Time Calculation (min) 46 min    Activity Tolerance Patient tolerated treatment well    Behavior During Therapy [facility] for tasks assessed/performed               Past Medical History:  Diagnosis Date   Abnormal vaginal Pap smear    Acute pharyngitis 02/25/2013   Anemia    Anxiety    Arthritis    BCC (basal cell carcinoma of skin) 10/05/2014   On back   Chicken pox as a child   Chronic UTI    sees dr Vonita Moss   Constipation 12/07/2015   Depression with anxiety 11/02/2009   Qualifier: Diagnosis of  By: Mayford Knife, LPN, Bonnye M    Dermatitis 07/17/2012   Esophageal stricture 1994   Fibroids    Foot pain, bilateral 06/19/2012   GERD (gastroesophageal reflux disease)    Hiatal hernia    Hyperglycemia 08/19/2013   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   Hyperlipidemia    Hypertension    Infertility, female    Low back pain 10/17/2007   Qualifier: Diagnosis of  By: Lovell Sheehan MD, [name]    Measles as a child   Obesity    Osteoarthritis    Parkinson disease    Plantar fasciitis of left foot 06/19/2012   Preventative health care 12/19/2015   Rheumatoid arthritis (HCC) 01/08/2018   Rosacea 10/05/2014   Swallowing difficulty    Urinary frequency 02/25/2013   Visual floaters 05/11/2014   Past Surgical History:  Procedure Laterality Date   ABDOMINAL HYSTERECTOMY  2006   total   esophageal     stretching   HERNIA REPAIR N/A 2022   hiatal hernia   laporoscopy     LEFT HEART CATH AND CORONARY ANGIOGRAPHY N/A 06/02/2019    Procedure: LEFT HEART CATH AND CORONARY ANGIOGRAPHY;  Surgeon: Corky Crafts, MD;  Location: [facility] IN[facility]AS[facility] CV LAB;  Service: Cardiovascular;  Laterality: N/A;   TONSILLECTOMY     TOTAL HIP ARTHROPLASTY Right 2010   UPPER GASTROINTESTINAL ENDOSCOPY     wisdom teeth extracted     Patient Active Problem List   Diagnosis Date Noted   Vitamin B 12 deficiency 05/30/2021   Anemia 03/16/2020   Coronary artery disease involving native coronary artery of native heart without angina pectoris 06/12/2019   Diastolic dysfunction 05/16/2019   Rheumatoid arthritis (HCC) 01/08/2018   Hand pain, right 10/15/2017   Vitamin D deficiency 12/20/2016   Prediabetes 12/20/2016   Shortness of breath on exertion 11/27/2016   Morbid obesity (HCC)    Constipation 12/07/2015   BCC (basal cell carcinoma of skin) 10/05/2014   Rosacea 10/05/2014   Parkinson's disease (HCC) 05/11/2014   Screening for cervical cancer 07/17/2012   Foot pain, bilateral 06/19/2012   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   Depression with anxiety 11/02/2009   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   Lumbar back pain with radiculopathy affecting lower extremity 10/17/2007   Essential hypertension 07/05/2007   Osteoarthritis 07/05/2007  Hyperlipidemia 06/26/2007   H/O: iron deficiency anemia 05/08/2007   Hiatal hernia with GERD 05/08/2007    ONSET DATE: 02/15/23 (PD screen)  REFERRING DIAG: G20.A1 (ICD-10-CM) - Parkinson's disease without dyskinesia or fluctuating manifestations   THERAPY DIAG:  Unsteadiness on feet  Other symptoms and signs involving the nervous system  Other abnormalities of gait and mobility  Abnormal posture  Muscle weakness (generalized)  Rationale for Evaluation and Treatment: Rehabilitation  SUBJECTIVE:                                                                                                                                                                                              SUBJECTIVE STATEMENT: Excited that the [employer] class is starting again.  Had the shot in my L knee; signed up for 3 gel shots (3 weeks in a row) in September, but it is still bothering me.  Seeing the pelvic floor therapist at urology office.  Started back to seeing [place] at [facility].  Saw Dr. Arbutus Leas and she added some medications-that has taken care of the internal tremor.  Feel so much better. Pt accompanied by: self  PERTINENT HISTORY: PD, anxiety, see additional PMH above  PAIN:  Are you having pain? Yes: NPRS scale: 2-7/10 Pain location: L knee Pain description: sore Aggravating factors: lifting leg, bone on bone arthritis Relieving factors: Aleve, cortisone shot   PRECAUTIONS: Fall 03/19/2023-No squats, no lunges, until the pain feels better.  WEIGHT BEARING RESTRICTIONS: No  FALLS: Has patient fallen in last 6 months? No  LIVING ENVIRONMENT: Lives with: lives with their family Lives in: House/apartment Stairs: Yes: Internal: 12 steps; bilateral but cannot reach both Has following equipment at home:  single walking pole  PLOF: Independent with household mobility without device and Independent with community mobility with device  PATIENT GOALS: To get rid of fear of falling  OBJECTIVE:    TODAY'S TREATMENT: 04/25/2023 Activity Comments  :  190 ft with walking pole   FTSTS:  13.75 sec with use of UE support UE support due to recent knee pain  TUG:  15.9 sec   67M back:  9.31 sec (1.07 ft/sec)   Gt velocity:  13.93 sec (2.41 ft/sec)   Palpation along L thigh>STM along R thigh Tenderness, tightness, reports some relief with STM  SLR LLE x 10, then with bias into external rotation x 10 No c/o pain  Seated LLE resisted hamstring curls, green theraband x 10 reps C/o pain in thigh and anterior calf  Standing L hamstring curls 10 reps    M-CTSIB  Condition 1: Firm Surface, EO 30 Sec, Normal Sway  Condition 2: Firm Surface, EC 30 Sec, Mild Sway  Condition 3: Foam Surface, EO  30 Sec, Mild Sway  Condition 4: Foam Surface, EC 30 Sec, Moderate Sway    PATIENT EDUCATION: Education details: POC, progress towards goals; HEP additions to address quad/hamstring strength around L knee; use of foam roller at home for self-massage to L quads Person educated: Patient Education method: Explanation, Demonstration, and Handouts Education comprehension: verbalized understanding, returned demonstration, and needs further education   HOME EXERCISE PROGRAM: Access Code: [email or web address] URL: [email or web address] Date: 04/25/2023 Prepared by: [facility] - Outpatient  Rehab - [facility]  Exercises - Small Range Straight Leg Raise  - 1 x daily - 5 x weekly - 3 sets - 10 reps - 3 sec hold - Straight Leg Raise with External Rotation  - 1 x daily - 5 x weekly - 3 sets - 10 reps - 3 sec hold - Standing Hamstring Curl with Chair Support  - 1 x daily - 7 x weekly - 3 sets - 10 reps      ------------------------------------------------------------ Objective measures below taken at initial evaluation:  DIAGNOSTIC FINDINGS: NA for this episode  COGNITION: Overall cognitive status: Within functional limits for tasks assessed   SENSATION: Light touch: WFL  MUSCLE TONE: RLE: Mild  POSTURE: rounded shoulders and forward head  LOWER EXTREMITY ROM:   WFL in sitting  Active  Right Eval Left Eval  Hip flexion  pain  Hip extension    Hip abduction    Hip adduction    Hip internal rotation    Hip external rotation    Knee flexion    Knee extension    Ankle dorsiflexion    Ankle plantarflexion    Ankle inversion    Ankle eversion     (Blank rows = not tested)  LOWER EXTREMITY MMT:  Grossly tested in sitting  MMT Right Eval Left Eval  Hip flexion 4+ 4  Hip extension    Hip abduction    Hip adduction    Hip internal rotation    Hip external rotation    Knee flexion 4+ 4+  Knee extension 4+ 4+  Ankle dorsiflexion 4 4  Ankle plantarflexion    Ankle  inversion    Ankle eversion    (Blank rows = not tested)  TRANSFERS: Assistive device utilized: None  Sit to stand: Modified independence Stand to sit: Modified independence  GAIT: Gait pattern: step through pattern, decreased arm swing- Right, decreased arm swing- Left, decreased stride length, and narrow BOS Distance walked: 100 ft Assistive device utilized:  single walking pole and None Level of assistance: Modified independence Comments: Guarded gait pattern  FUNCTIONAL TESTS:  5 times sit to stand: 12.31 sec Timed up and go (TUG): 14.34 sec 10 M:  12.1 sec = 2.7 ft/sec MiniBESTest: 22/28  TUG cognitive:14.56 sec  TUG manual:  14.62 sec  Full 360 turn:  8 steps  3M walk backwards:  16.10 sec = 0.62 ft/sec   M-CTSIB  Condition 1: Firm Surface, EO 30 Sec, Normal Sway  Condition 2: Firm Surface, EC 30 Sec, Mild Sway  Condition 3: Foam Surface, EO 30 Sec, Mild Sway  Condition 4: Foam Surface, EC 29.69 Sec, Moderate and Severe Sway    PATIENT SURVEYS:  ABC scale 78.75 (walking across parking lot is 50%, stepping off escalator is 60%  TODAY'S TREATMENT:  DATE: 03/06/2023    PATIENT EDUCATION: Education details: Eval results, POC Person educated: Patient Education method: Explanation Education comprehension: verbalized understanding  HOME EXERCISE PROGRAM: Not yet initiated   GOALS: Goals reviewed with patient? Yes  SHORT TERM GOALS: Target date: 04/06/2023>UPDATED TARGET DATE 05/18/2023  Pt will be independent with HEP for improved balance, gait, strength. Baseline:new HEP provided 04/25/2023 Goal status: IN PROGRESS  2.  Pt will improve 5x sit<>stand to less than or equal to 11 sec to demonstrate improved functional strength and transfer efficiency. Baseline: 12.31 sec; 1375 sec 04/25/2023 Goal status: IN PROGRESS  3.  Pt will improve 43M walk  backwards to at least 1 ft/sec, without walking pole, for improved balance. Baseline: 0.62 ft/sec> 1.07 ft/sec with walking pole 04/25/2023  Goal status: REVISED  4.  Pt will improve MCTSIB Condition 4 to minimal sway for improved balance.  Baseline: mod sway 04/25/2023 Goal status:IN PROGRESS  LONG TERM GOALS: Target date: 04/20/2023>UPDATED TARGET DATE 06/08/2023  Pt will be Independent with HEP for improved balance, strength, gait. Baseline:  Goal status: IN PROGRESS  2.  Pt will improve MiniBESTest score to at least 24/28 to decrease fall risk. Baseline: 22/28 Goal status: IN PROGRESS  3.  Pt will improve ABC score to at least 85% for improved overall balance confidence  Baseline: 78.75% Goal status: IN PROGRESS  4.  Pt will report improved confidence in curb, outdoor surface negotiation by at least 50% for improved community mobility. Baseline:  Goal status: IN PROGRESS   ASSESSMENT:  CLINICAL IMPRESSION: Pt has been on hold for PT since mid-July, due to follow-up appointments with neurologist, PCP, and due to L knee pain.  Pt is to have further follow up regarding knee pain, with gel injections (3 weeks in a row) in September.  Pt has had addition to her Parkinson's medications, and she reports no longer feeling the internal tremor.  With measures assessed today, she continues to present with decreased functional strength, decreased balance, decreased timing/coordination and endurance with gait.  Goals remain appropriate, with date extended.  She will benefit from continued skilled PT towards goals for improved functional mobility and decreased fall risk.     OBJECTIVE IMPAIRMENTS: Abnormal gait, decreased balance, decreased mobility, difficulty walking, decreased strength, and postural dysfunction.   ACTIVITY LIMITATIONS: standing, stairs, transfers, locomotion level, and caring for others  PARTICIPATION LIMITATIONS: shopping and community activity  PERSONAL FACTORS: 3+  comorbidities: see PMH above  are also affecting patient's functional outcome.   REHAB POTENTIAL: Good  CLINICAL DECISION MAKING: Evolving/moderate complexity  EVALUATION COMPLEXITY: Moderate  PLAN:  PT FREQUENCY: 2x/week  PT DURATION: 6 weeks plus eval week  PLANNED INTERVENTIONS: Therapeutic exercises, Therapeutic activity, Neuromuscular re-education, Balance training, Gait training, Patient/Family education, and Self Care  PLAN FOR NEXT SESSION: Review updates to HEP; work on progressing HEP for balance-weightshifting, EO and EC foam surfaces, SLS, curb and stair negotiation, unlevel surfaces   Lonia Blood, PT 04/25/23 9:03 AM Phone: [phone] Fax: [phone]   [facility] Outpatient Rehab at [facility] [address] [place], [place] [address] Phone # [phone] Fax # [phone]

## 2023-04-30 ENCOUNTER — Ambulatory Visit: Payer: Medicare Other | Admitting: Physical Therapy

## 2023-04-30 ENCOUNTER — Encounter: Payer: Self-pay | Admitting: Physical Therapy

## 2023-04-30 DIAGNOSIS — R2681 Unsteadiness on feet: Secondary | ICD-10-CM

## 2023-04-30 DIAGNOSIS — R2689 Other abnormalities of gait and mobility: Secondary | ICD-10-CM | POA: Diagnosis not present

## 2023-04-30 DIAGNOSIS — R29818 Other symptoms and signs involving the nervous system: Secondary | ICD-10-CM

## 2023-04-30 DIAGNOSIS — R293 Abnormal posture: Secondary | ICD-10-CM | POA: Diagnosis not present

## 2023-04-30 DIAGNOSIS — M6281 Muscle weakness (generalized): Secondary | ICD-10-CM | POA: Diagnosis not present

## 2023-04-30 NOTE — Therapy (Signed)
OUTPATIENT PHYSICAL THERAPY NEURO TREATMENT   Patient Name: Kristin [name] MRN: [id number] DOB:[birth year], 72 y.o., female Today's Date: 04/30/2023   PCP: Shelva Majestic, MD REFERRING PROVIDER: Vladimir Faster, DO   END OF SESSION:  PT End of Session - 04/30/23 1538     Visit Number 5    Number of Visits 16    Date for PT Re-Evaluation 06/08/23    Authorization Type Medicare/BCBS    PT Start Time 1536    PT Stop Time 1617    PT Time Calculation (min) 41 min    Activity Tolerance Patient tolerated treatment well    Behavior During Therapy [facility] for tasks assessed/performed                Past Medical History:  Diagnosis Date   Abnormal vaginal Pap smear    Acute pharyngitis 02/25/2013   Anemia    Anxiety    Arthritis    BCC (basal cell carcinoma of skin) 10/05/2014   On back   Chicken pox as a child   Chronic UTI    sees dr Vonita Moss   Constipation 12/07/2015   Depression with anxiety 11/02/2009   Qualifier: Diagnosis of  By: Mayford Knife, LPN, Bonnye M    Dermatitis 07/17/2012   Esophageal stricture 1994   Fibroids    Foot pain, bilateral 06/19/2012   GERD (gastroesophageal reflux disease)    Hiatal hernia    Hyperglycemia 08/19/2013   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   Hyperlipidemia    Hypertension    Infertility, female    Low back pain 10/17/2007   Qualifier: Diagnosis of  By: Lovell Sheehan MD, [name]    Measles as a child   Obesity    Osteoarthritis    Parkinson disease    Plantar fasciitis of left foot 06/19/2012   Preventative health care 12/19/2015   Rheumatoid arthritis (HCC) 01/08/2018   Rosacea 10/05/2014   Swallowing difficulty    Urinary frequency 02/25/2013   Visual floaters 05/11/2014   Past Surgical History:  Procedure Laterality Date   ABDOMINAL HYSTERECTOMY  2006   total   esophageal     stretching   HERNIA REPAIR N/A 2022   hiatal hernia   laporoscopy     LEFT HEART CATH AND CORONARY ANGIOGRAPHY N/A 06/02/2019   Procedure:  LEFT HEART CATH AND CORONARY ANGIOGRAPHY;  Surgeon: Corky Crafts, MD;  Location: [facility] INVASIVE CV LAB;  Service: Cardiovascular;  Laterality: N/A;   TONSILLECTOMY     TOTAL HIP ARTHROPLASTY Right 2010   UPPER GASTROINTESTINAL ENDOSCOPY     wisdom teeth extracted     Patient Active Problem List   Diagnosis Date Noted   Vitamin B 12 deficiency 05/30/2021   Anemia 03/16/2020   Coronary artery disease involving native coronary artery of native heart without angina pectoris 06/12/2019   Diastolic dysfunction 05/16/2019   Rheumatoid arthritis (HCC) 01/08/2018   Hand pain, right 10/15/2017   Vitamin D deficiency 12/20/2016   Prediabetes 12/20/2016   Shortness of breath on exertion 11/27/2016   Morbid obesity (HCC)    Constipation 12/07/2015   BCC (basal cell carcinoma of skin) 10/05/2014   Rosacea 10/05/2014   Parkinson's disease (HCC) 05/11/2014   Screening for cervical cancer 07/17/2012   Foot pain, bilateral 06/19/2012   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   Depression with anxiety 11/02/2009   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   Lumbar back pain with radiculopathy affecting lower extremity 10/17/2007   Essential hypertension 07/05/2007   Osteoarthritis 07/05/2007  Hyperlipidemia 06/26/2007   H/O: iron deficiency anemia 05/08/2007   Hiatal hernia with GERD 05/08/2007    ONSET DATE: 02/15/23 (PD screen)  REFERRING DIAG: G20.A1 (ICD-10-CM) - Parkinson's disease without dyskinesia or fluctuating manifestations   THERAPY DIAG:  Unsteadiness on feet  Muscle weakness (generalized)  Other symptoms and signs involving the nervous system  Other abnormalities of gait and mobility  Rationale for Evaluation and Treatment: Rehabilitation  SUBJECTIVE:                                                                                                                                                                                             SUBJECTIVE STATEMENT: Nothing  really new.  Fell asleep and took a nap and overslept a bit.  Pt accompanied by: self  PERTINENT HISTORY: PD, anxiety, see additional PMH above  PAIN:  Are you having pain? Yes: NPRS scale: 4/10 Pain location: L knee Pain description: sore Aggravating factors: lifting leg, bone on bone arthritis Relieving factors: Aleve, cortisone shot   PRECAUTIONS: Fall 03/19/2023-No squats, no lunges, until the pain feels better.  WEIGHT BEARING RESTRICTIONS: No  FALLS: Has patient fallen in last 6 months? No  LIVING ENVIRONMENT: Lives with: lives with their family Lives in: House/apartment Stairs: Yes: Internal: 12 steps; bilateral but cannot reach both Has following equipment at home:  single walking pole  PLOF: Independent with household mobility without device and Independent with community mobility with device  PATIENT GOALS: To get rid of fear of falling  OBJECTIVE:    TODAY'S TREATMENT: 04/30/2023 Activity Comments  Reviewed new HEP from last visit: -SLR x 10 reps -SLR with external rotation x 10 -Standing hamstring curls x 10 Good return demo, no c/o L knee pain  Standing hip abduction 2 x 10 Standing hip extension 2 x 10 March in place 2 x 10 Heel/toe raises 2 x 10 Sidestep R and L, 2 x 10  2#  Forward/back walking in parallel bars x 2 min   Sidestepping x 2 min   Monster walk forward/back 2 minutes, then forward with opposite UE lift x 1 min BUE support>1 UE support  Gait 4 minutes 400 ft, using single walking pole 1 standing break to reset balance and breathing     PATIENT EDUCATION: Education details: Continue current HEP; walking program at home (inside) 2-4 minutes, 3 times/day Person educated: Patient Education method: Explanation, Demonstration, and Handouts Education comprehension: verbalized understanding, returned demonstration, and needs further education   HOME EXERCISE PROGRAM: Access Code: [email or web address] URL: [email or web address] Date:  04/25/2023 Prepared by: [facility] - Outpatient  Rehab - [facility]  Exercises -  Small Range Straight Leg Raise  - 1 x daily - 5 x weekly - 3 sets - 10 reps - 3 sec hold - Straight Leg Raise with External Rotation  - 1 x daily - 5 x weekly - 3 sets - 10 reps - 3 sec hold - Standing Hamstring Curl with Chair Support  - 1 x daily - 7 x weekly - 3 sets - 10 reps      ------------------------------------------------------------ Objective measures below taken at initial evaluation:  DIAGNOSTIC FINDINGS: NA for this episode  COGNITION: Overall cognitive status: Within functional limits for tasks assessed   SENSATION: Light touch: WFL  MUSCLE TONE: RLE: Mild  POSTURE: rounded shoulders and forward head  LOWER EXTREMITY ROM:   WFL in sitting  Active  Right Eval Left Eval  Hip flexion  pain  Hip extension    Hip abduction    Hip adduction    Hip internal rotation    Hip external rotation    Knee flexion    Knee extension    Ankle dorsiflexion    Ankle plantarflexion    Ankle inversion    Ankle eversion     (Blank rows = not tested)  LOWER EXTREMITY MMT:  Grossly tested in sitting  MMT Right Eval Left Eval  Hip flexion 4+ 4  Hip extension    Hip abduction    Hip adduction    Hip internal rotation    Hip external rotation    Knee flexion 4+ 4+  Knee extension 4+ 4+  Ankle dorsiflexion 4 4  Ankle plantarflexion    Ankle inversion    Ankle eversion    (Blank rows = not tested)  TRANSFERS: Assistive device utilized: None  Sit to stand: Modified independence Stand to sit: Modified independence  GAIT: Gait pattern: step through pattern, decreased arm swing- Right, decreased arm swing- Left, decreased stride length, and narrow BOS Distance walked: 100 ft Assistive device utilized:  single walking pole and None Level of assistance: Modified independence Comments: Guarded gait pattern  FUNCTIONAL TESTS:  5 times sit to stand: 12.31 sec Timed up and go  (TUG): 14.34 sec 10 M:  12.1 sec = 2.7 ft/sec MiniBESTest: 22/28  TUG cognitive:14.56 sec  TUG manual:  14.62 sec  Full 360 turn:  8 steps  8M walk backwards:  16.10 sec = 0.62 ft/sec   M-CTSIB  Condition 1: Firm Surface, EO 30 Sec, Normal Sway  Condition 2: Firm Surface, EC 30 Sec, Mild Sway  Condition 3: Foam Surface, EO 30 Sec, Mild Sway  Condition 4: Foam Surface, EC 29.69 Sec, Moderate and Severe Sway    PATIENT SURVEYS:  ABC scale 78.75 (walking across parking lot is 50%, stepping off escalator is 60%  TODAY'S TREATMENT:                                                                                                                              DATE: 03/06/2023  PATIENT EDUCATION: Education details: Eval results, POC Person educated: Patient Education method: Explanation Education comprehension: verbalized understanding  HOME EXERCISE PROGRAM: Not yet initiated   GOALS: Goals reviewed with patient? Yes  SHORT TERM GOALS: Target date: 04/06/2023>UPDATED TARGET DATE 05/18/2023  Pt will be independent with HEP for improved balance, gait, strength. Baseline:new HEP provided 04/25/2023 Goal status: IN PROGRESS  2.  Pt will improve 5x sit<>stand to less than or equal to 11 sec to demonstrate improved functional strength and transfer efficiency. Baseline: 12.31 sec; 1375 sec 04/25/2023 Goal status: IN PROGRESS  3.  Pt will improve 72M walk backwards to at least 1 ft/sec, without walking pole, for improved balance. Baseline: 0.62 ft/sec> 1.07 ft/sec with walking pole 04/25/2023  Goal status: REVISED  4.  Pt will improve MCTSIB Condition 4 to minimal sway for improved balance.  Baseline: mod sway 04/25/2023 Goal status:IN PROGRESS  LONG TERM GOALS: Target date: 04/20/2023>UPDATED TARGET DATE 06/08/2023  Pt will be Independent with HEP for improved balance, strength, gait. Baseline:  Goal status: IN PROGRESS  2.  Pt will improve MiniBESTest score to at least 24/28 to  decrease fall risk. Baseline: 22/28 Goal status: IN PROGRESS  3.  Pt will improve ABC score to at least 85% for improved overall balance confidence  Baseline: 78.75% Goal status: IN PROGRESS  4.  Pt will report improved confidence in curb, outdoor surface negotiation by at least 50% for improved community mobility. Baseline:  Goal status: IN PROGRESS   ASSESSMENT:  CLINICAL IMPRESSION: Pt presents to OPPT today with continued occasional L knee pain.  Reviewed HEP, with pt performs well without c/o pain.  Worked on standing strengthening/balance exercises with added ankle weight, again no pain; pt does have some hesitancy at times reporting she feels off balance.  She uses UE suppor and needs cues occasionally for widened BOS.  She will benefit from continued skilled PT towards goals for improved functional mobility, balance, gait for decreased fall risk.     OBJECTIVE IMPAIRMENTS: Abnormal gait, decreased balance, decreased mobility, difficulty walking, decreased strength, and postural dysfunction.   ACTIVITY LIMITATIONS: standing, stairs, transfers, locomotion level, and caring for others  PARTICIPATION LIMITATIONS: shopping and community activity  PERSONAL FACTORS: 3+ comorbidities: see PMH above  are also affecting patient's functional outcome.   REHAB POTENTIAL: Good  CLINICAL DECISION MAKING: Evolving/moderate complexity  EVALUATION COMPLEXITY: Moderate  PLAN:  PT FREQUENCY: 2x/week  PT DURATION: 6 weeks plus eval week  PLANNED INTERVENTIONS: Therapeutic exercises, Therapeutic activity, Neuromuscular re-education, Balance training, Gait training, Patient/Family education, and Self Care  PLAN FOR NEXT SESSION: work on progressing HEP for balance-weightshifting, EO and EC foam surfaces, SLS, curb and stair negotiation, unlevel surfaces   Lonia Blood, PT 04/30/23 4:26 PM Phone: [phone] Fax: [phone]   [facility] Outpatient Rehab at [facility] [address], [address] [place], [place] [address] Phone # [phone] Fax # [phone]

## 2023-05-04 ENCOUNTER — Encounter: Payer: Self-pay | Admitting: Physical Therapy

## 2023-05-04 ENCOUNTER — Ambulatory Visit: Payer: Medicare Other | Admitting: Physical Therapy

## 2023-05-04 DIAGNOSIS — R293 Abnormal posture: Secondary | ICD-10-CM | POA: Diagnosis not present

## 2023-05-04 DIAGNOSIS — R2681 Unsteadiness on feet: Secondary | ICD-10-CM

## 2023-05-04 DIAGNOSIS — R29818 Other symptoms and signs involving the nervous system: Secondary | ICD-10-CM

## 2023-05-04 DIAGNOSIS — M6281 Muscle weakness (generalized): Secondary | ICD-10-CM

## 2023-05-04 DIAGNOSIS — R2689 Other abnormalities of gait and mobility: Secondary | ICD-10-CM | POA: Diagnosis not present

## 2023-05-04 NOTE — Therapy (Signed)
OUTPATIENT PHYSICAL THERAPY NEURO TREATMENT   Patient Name: Kristin [name] MRN: [id number] DOB:Nov 24, 1950, 72 y.o., female Today's Date: 05/04/2023   PCP: Shelva Majestic, MD REFERRING PROVIDER: Vladimir Faster, DO   END OF SESSION:  PT End of Session - 05/04/23 0847     Visit Number 6    Number of Visits 16    Date for PT Re-Evaluation 06/08/23    Authorization Type Medicare/BCBS    PT Start Time 0848    PT Stop Time 0930    PT Time Calculation (min) 42 min    Activity Tolerance Patient tolerated treatment well    Behavior During Therapy [facility] for tasks assessed/performed                 Past Medical History:  Diagnosis Date   Abnormal vaginal Pap smear    Acute pharyngitis 02/25/2013   Anemia    Anxiety    Arthritis    BCC (basal cell carcinoma of skin) 10/05/2014   On back   Chicken pox as a child   Chronic UTI    sees dr Vonita Moss   Constipation 12/07/2015   Depression with anxiety 11/02/2009   Qualifier: Diagnosis of  By: Mayford Knife, LPN, Bonnye M    Dermatitis 07/17/2012   Esophageal stricture 1994   Fibroids    Foot pain, bilateral 06/19/2012   GERD (gastroesophageal reflux disease)    Hiatal hernia    Hyperglycemia 08/19/2013   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   Hyperlipidemia    Hypertension    Infertility, female    Low back pain 10/17/2007   Qualifier: Diagnosis of  By: Lovell Sheehan MD, [name]    Measles as a child   Obesity    Osteoarthritis    Parkinson disease    Plantar fasciitis of left foot 06/19/2012   Preventative health care 12/19/2015   Rheumatoid arthritis (HCC) 01/08/2018   Rosacea 10/05/2014   Swallowing difficulty    Urinary frequency 02/25/2013   Visual floaters 05/11/2014   Past Surgical History:  Procedure Laterality Date   ABDOMINAL HYSTERECTOMY  2006   total   esophageal     stretching   HERNIA REPAIR N/A 2022   hiatal hernia   laporoscopy     LEFT HEART CATH AND CORONARY ANGIOGRAPHY N/A 06/02/2019   Procedure:  LEFT HEART CATH AND CORONARY ANGIOGRAPHY;  Surgeon: Corky Crafts, MD;  Location: [facility];  Service: Cardiovascular;  Laterality: N/A;   TONSILLECTOMY     TOTAL HIP ARTHROPLASTY Right 2010   UPPER GASTROINTESTINAL ENDOSCOPY     wisdom teeth extracted     Patient Active Problem List   Diagnosis Date Noted   Vitamin B 12 deficiency 05/30/2021   Anemia 03/16/2020   Coronary artery disease involving native coronary artery of native heart without angina pectoris 06/12/2019   Diastolic dysfunction 05/16/2019   Rheumatoid arthritis (HCC) 01/08/2018   Hand pain, right 10/15/2017   Vitamin D deficiency 12/20/2016   Prediabetes 12/20/2016   Shortness of breath on exertion 11/27/2016   Morbid obesity (HCC)    Constipation 12/07/2015   BCC (basal cell carcinoma of skin) 10/05/2014   Rosacea 10/05/2014   Parkinson's disease (HCC) 05/11/2014   Screening for cervical cancer 07/17/2012   Foot pain, bilateral 06/19/2012   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   Depression with anxiety 11/02/2009   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   Lumbar back pain with radiculopathy affecting lower extremity 10/17/2007   Essential hypertension 07/05/2007   Osteoarthritis  07/05/2007   Hyperlipidemia 06/26/2007   H/O: iron deficiency anemia 05/08/2007   Hiatal hernia with GERD 05/08/2007    ONSET DATE: 02/15/23 (PD screen)  REFERRING DIAG: G20.A1 (ICD-10-CM) - Parkinson's disease without dyskinesia or fluctuating manifestations   THERAPY DIAG:  Unsteadiness on feet  Muscle weakness (generalized)  Other abnormalities of gait and mobility  Other symptoms and signs involving the nervous system  Rationale for Evaluation and Treatment: Rehabilitation  SUBJECTIVE:                                                                                                                                                                                             SUBJECTIVE STATEMENT: My leg  has been in and out.  Some days it feels great, other days not so much.    Pt accompanied by: self  PERTINENT HISTORY: PD, anxiety, see additional PMH above  PAIN:  Are you having pain? Yes: NPRS scale: 6>4/10 Pain location: L knee Pain description: sore Aggravating factors: lifting leg, bone on bone arthritis Relieving factors: Aleve, cortisone shot; walking actually helps   PRECAUTIONS: Fall 03/19/2023-No squats, no lunges, until the pain feels better.  WEIGHT BEARING RESTRICTIONS: No  FALLS: Has patient fallen in last 6 months? No  LIVING ENVIRONMENT: Lives with: lives with their family Lives in: House/apartment Stairs: Yes: Internal: 12 steps; bilateral but cannot reach both Has following equipment at home:  single walking pole  PLOF: Independent with household mobility without device and Independent with community mobility with device  PATIENT GOALS: To get rid of fear of falling  OBJECTIVE:    TODAY'S TREATMENT: 05/04/2023 Activity Comments     Standing hip abduction 2 x 10 Standing hip extension 2 x 10 March in place 2 x 10 Heel/toe raises 2 x 10 Sidestep R and L, 2 x 10  2#                    TODAY'S TREATMENT: 05/04/2023 Activity Comments  Gait x 4 minutes Walking pole, 2 brief standing rest breaks  Standing hip abduction 3 x 10 Standing hip extension 3 x 10 Hamstring curls 2 x 10 March in place 2 x 10 Heel/toe raises 3 x 10 Sidestep R and L, 2 x 10  2#, cues to slow pace, cues for wider BOS for march  Forward/back walking in parallel bars x 2 min 2# weight     Monster walk forward/back 2 minutes, then forward with opposite UE lift x 1 min BUE support>1 UE support, using 2# weight  Gait 170 ft with single walking pole      PATIENT  EDUCATION: Education details: HEP additions-see below Person educated: Patient Education method: Explanation, Facilities manager, and Handouts Education comprehension: verbalized understanding, returned demonstration, and  needs further education   HOME EXERCISE PROGRAM: Access Code: [email or web address] URL: [email or web address] Date: 05/04/2023 Prepared by: [facility]  Re[facility] - [facility]  Exercises - Small Range Straight Leg Raise  - 1 x daily - 5 x weekly - 3 sets - 10 reps - 3 sec hold - Straight Leg Raise with External Rotation  - 1 x daily - 5 x weekly - 3 sets - 10 reps - 3 sec hold - Standing Hamstring Curl with Chair Support  - 1 x daily - 7 x weekly - 3 sets - 10 reps - Standing Hip Abduction with Ankle Weight  - 1 x daily - 4 x weekly - 3 sets - 10 reps - Standing Hip Extension with Ankle Weight  - 1 x daily - 4 x weekly - 3 sets - 10 reps - Standing Marching  - 1 x daily - 4 x weekly - 3 sets - 10 reps - Side Stepping with Counter Support  - 1 x daily - 4 x weekly - 3 sets - 10 reps       ------------------------------------------------------------ Objective measures below taken at initial evaluation:  DIAGNOSTIC FINDINGS: NA for this episode  COGNITION: Overall cognitive status: Within functional limits for tasks assessed   SENSATION: Light touch: WFL  MUSCLE TONE: RLE: Mild  POSTURE: rounded shoulders and forward head  LOWER EXTREMITY ROM:   WFL in sitting  Active  Right Eval Left Eval  Hip flexion  pain  Hip extension    Hip abduction    Hip adduction    Hip internal rotation    Hip external rotation    Knee flexion    Knee extension    Ankle dorsiflexion    Ankle plantarflexion    Ankle inversion    Ankle eversion     (Blank rows = not tested)  LOWER EXTREMITY MMT:  Grossly tested in sitting  MMT Right Eval Left Eval  Hip flexion 4+ 4  Hip extension    Hip abduction    Hip adduction    Hip internal rotation    Hip external rotation    Knee flexion 4+ 4+  Knee extension 4+ 4+  Ankle dorsiflexion 4 4  Ankle plantarflexion    Ankle inversion    Ankle eversion    (Blank rows = not tested)  TRANSFERS: Assistive device utilized:  None  Sit to stand: Modified independence Stand to sit: Modified independence  GAIT: Gait pattern: step through pattern, decreased arm swing- Right, decreased arm swing- Left, decreased stride length, and narrow BOS Distance walked: 100 ft Assistive device utilized:  single walking pole and None Level of assistance: Modified independence Comments: Guarded gait pattern  FUNCTIONAL TESTS:  5 times sit to stand: 12.31 sec Timed up and go (TUG): 14.34 sec 10 M:  12.1 sec = 2.7 ft/sec MiniBESTest: 22/28  TUG cognitive:14.56 sec  TUG manual:  14.62 sec  Full 360 turn:  8 steps  65M walk backwards:  16.10 sec = 0.62 ft/sec   M-CTSIB  Condition 1: Firm Surface, EO 30 Sec, Normal Sway  Condition 2: Firm Surface, EC 30 Sec, Mild Sway  Condition 3: Foam Surface, EO 30 Sec, Mild Sway  Condition 4: Foam Surface, EC 29.69 Sec, Moderate and Severe Sway    PATIENT SURVEYS:  ABC scale 78.75 (walking across parking lot is  50%, stepping off escalator is 60%  TODAY'S TREATMENT:                                                                                                                              DATE: 03/06/2023    PATIENT EDUCATION: Education details: Eval results, POC Person educated: Patient Education method: Explanation Education comprehension: verbalized understanding  HOME EXERCISE PROGRAM: Not yet initiated   GOALS: Goals reviewed with patient? Yes  SHORT TERM GOALS: Target date: 04/06/2023>UPDATED TARGET DATE 05/18/2023  Pt will be independent with HEP for improved balance, gait, strength. Baseline:new HEP provided 04/25/2023 Goal status: IN PROGRESS  2.  Pt will improve 5x sit<>stand to less than or equal to 11 sec to demonstrate improved functional strength and transfer efficiency. Baseline: 12.31 sec; 1375 sec 04/25/2023 Goal status: IN PROGRESS  3.  Pt will improve 26M walk backwards to at least 1 ft/sec, without walking pole, for improved balance. Baseline: 0.62 ft/sec>  1.07 ft/sec with walking pole 04/25/2023  Goal status: REVISED  4.  Pt will improve MCTSIB Condition 4 to minimal sway for improved balance.  Baseline: mod sway 04/25/2023 Goal status:IN PROGRESS  LONG TERM GOALS: Target date: 04/20/2023>UPDATED TARGET DATE 06/08/2023  Pt will be Independent with HEP for improved balance, strength, gait. Baseline:  Goal status: IN PROGRESS  2.  Pt will improve MiniBESTest score to at least 24/28 to decrease fall risk. Baseline: 22/28 Goal status: IN PROGRESS  3.  Pt will improve ABC score to at least 85% for improved overall balance confidence  Baseline: 78.75% Goal status: IN PROGRESS  4.  Pt will report improved confidence in curb, outdoor surface negotiation by at least 50% for improved community mobility. Baseline:  Goal status: IN PROGRESS   ASSESSMENT:  CLINICAL IMPRESSION: Pt able to progress to 3 sets of 10 reps with most weighted strengthening exercises, without c/o knee pain today.  Also utilized weights for dynamic balance  activities in parallel bars.  Pt does not have any c/o with pain throughout session, and reports that legs feel good and stronger at end of session.  Updated your exercises to include weights for standing exercises.  She will benefit from continued skilled PT towards goals for improved functional mobility, balance, gait for decreased fall risk.     OBJECTIVE IMPAIRMENTS: Abnormal gait, decreased balance, decreased mobility, difficulty walking, decreased strength, and postural dysfunction.   ACTIVITY LIMITATIONS: standing, stairs, transfers, locomotion level, and caring for others  PARTICIPATION LIMITATIONS: shopping and community activity  PERSONAL FACTORS: 3+ comorbidities: see PMH above  are also affecting patient's functional outcome.   REHAB POTENTIAL: Good  CLINICAL DECISION MAKING: Evolving/moderate complexity  EVALUATION COMPLEXITY: Moderate  PLAN:  PT FREQUENCY: 2x/week  PT DURATION: 6 weeks plus  eval week  PLANNED INTERVENTIONS: Therapeutic exercises, Therapeutic activity, Neuromuscular re-education, Balance training, Gait training, Patient/Family education, and Self Care  PLAN FOR NEXT SESSION: Review HEP; continue working on balance-weightshifting, EO and EC  foam surfaces, SLS, curb and stair negotiation, unlevel surfaces   Lonia Blood, PT 05/04/23 9:30 AM Phone: [phone] Fax: [phone]   [facility] at [facility] [address] [place], [address] [place], [place] [address] Phone # [phone] Fax # [phone]

## 2023-05-08 ENCOUNTER — Ambulatory Visit: Payer: Medicare Other | Attending: Neurology | Admitting: Physical Therapy

## 2023-05-08 ENCOUNTER — Encounter: Payer: Self-pay | Admitting: Physical Therapy

## 2023-05-08 DIAGNOSIS — M6281 Muscle weakness (generalized): Secondary | ICD-10-CM | POA: Insufficient documentation

## 2023-05-08 DIAGNOSIS — R2681 Unsteadiness on feet: Secondary | ICD-10-CM | POA: Insufficient documentation

## 2023-05-08 DIAGNOSIS — R2689 Other abnormalities of gait and mobility: Secondary | ICD-10-CM | POA: Diagnosis not present

## 2023-05-08 DIAGNOSIS — R29818 Other symptoms and signs involving the nervous system: Secondary | ICD-10-CM | POA: Insufficient documentation

## 2023-05-08 NOTE — Therapy (Signed)
OUTPATIENT PHYSICAL THERAPY NEURO TREATMENT   Patient Name: Kristin [name] MRN: [id number] DOB:1951/01/28, 72 y.o., female Today's Date: 05/08/2023   PCP: Shelva Majestic, MD REFERRING PROVIDER: Vladimir Faster, DO   END OF SESSION:  PT End of Session - 05/08/23 1022     Visit Number 7    Number of Visits 16    Date for PT Re-Evaluation 06/08/23    Authorization Type Medicare/BCBS    PT Start Time 1021    PT Stop Time 1100    PT Time Calculation (min) 39 min    Activity Tolerance Patient tolerated treatment well    Behavior During Therapy WFL for tasks assessed/performed                  Past Medical History:  Diagnosis Date   Abnormal vaginal Pap smear    Acute pharyngitis 02/25/2013   Anemia    Anxiety    Arthritis    BCC (basal cell carcinoma of skin) 10/05/2014   On back   Chicken pox as a child   Chronic UTI    sees dr Vonita Moss   Constipation 12/07/2015   Depression with anxiety 11/02/2009   Qualifier: Diagnosis of  By: Mayford Knife, LPN, Bonnye M    Dermatitis 07/17/2012   Esophageal stricture 1994   Fibroids    Foot pain, bilateral 06/19/2012   GERD (gastroesophageal reflux disease)    Hiatal hernia    Hyperglycemia 08/19/2013   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   Hyperlipidemia    Hypertension    Infertility, female    Low back pain 10/17/2007   Qualifier: Diagnosis of  By: Lovell Sheehan MD, [name]    Measles as a child   Obesity    Osteoarthritis    Parkinson disease    Plantar fasciitis of left foot 06/19/2012   Preventative health care 12/19/2015   Rheumatoid arthritis (HCC) 01/08/2018   Rosacea 10/05/2014   Swallowing difficulty    Urinary frequency 02/25/2013   Visual floaters 05/11/2014   Past Surgical History:  Procedure Laterality Date   ABDOMINAL HYSTERECTOMY  2006   total   esophageal     stretching   HERNIA REPAIR N/A 2022   hiatal hernia   laporoscopy     LEFT HEART CATH AND CORONARY ANGIOGRAPHY N/A 06/02/2019   Procedure:  LEFT HEART CATH AND CORONARY ANGIOGRAPHY;  Surgeon: Corky Crafts, MD;  Location: [facility];  Service: Cardiovascular;  Laterality: N/A;   TONSILLECTOMY     TOTAL HIP ARTHROPLASTY Right 2010   UPPER GASTROINTESTINAL ENDOSCOPY     wisdom teeth extracted     Patient Active Problem List   Diagnosis Date Noted   Vitamin B 12 deficiency 05/30/2021   Anemia 03/16/2020   Coronary artery disease involving native coronary artery of native heart without angina pectoris 06/12/2019   Diastolic dysfunction 05/16/2019   Rheumatoid arthritis (HCC) 01/08/2018   Hand pain, right 10/15/2017   Vitamin D deficiency 12/20/2016   Prediabetes 12/20/2016   Shortness of breath on exertion 11/27/2016   Morbid obesity (HCC)    Constipation 12/07/2015   BCC (basal cell carcinoma of skin) 10/05/2014   Rosacea 10/05/2014   Parkinson's disease (HCC) 05/11/2014   Screening for cervical cancer 07/17/2012   Foot pain, bilateral 06/19/2012   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   Depression with anxiety 11/02/2009   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   Lumbar back pain with radiculopathy affecting lower extremity 10/17/2007   Essential hypertension 07/05/2007  Osteoarthritis 07/05/2007   Hyperlipidemia 06/26/2007   H/O: iron deficiency anemia 05/08/2007   Hiatal hernia with GERD 05/08/2007    ONSET DATE: 02/15/23 (PD screen)  REFERRING DIAG: G20.A1 (ICD-10-CM) - Parkinson's disease without dyskinesia or fluctuating manifestations   THERAPY DIAG:  Unsteadiness on feet  Muscle weakness (generalized)  Other abnormalities of gait and mobility  Other symptoms and signs involving the nervous system  Rationale for Evaluation and Treatment: Rehabilitation  SUBJECTIVE:                                                                                                                                                                                             SUBJECTIVE STATEMENT: Not sure  why, but the knee and muscles hurt at times.  It's very hard to get in and out of the car.  It didn't  bother me to do the exercises, but then last night, it was 10/10.  Did order the weights.    Pt accompanied by: self  PERTINENT HISTORY: PD, anxiety, see additional PMH above  PAIN:  Are you having pain? Yes: NPRS scale: 0-7/10 Pain location: L knee Pain description: sore Aggravating factors: lifting leg to get into car , bone on bone arthritis Relieving factors: Aleve, cortisone shot; walking actually helps   PRECAUTIONS: Fall 03/19/2023-No squats, no lunges, until the pain feels better.  WEIGHT BEARING RESTRICTIONS: No  FALLS: Has patient fallen in last 6 months? No  LIVING ENVIRONMENT: Lives with: lives with their family Lives in: House/apartment Stairs: Yes: Internal: 12 steps; bilateral but cannot reach both Has following equipment at home:  single walking pole  PLOF: Independent with household mobility without device and Independent with community mobility with device  PATIENT GOALS: To get rid of fear of falling  OBJECTIVE:     TODAY'S TREATMENT: 05/08/2023 Activity Comments  Seated hip/knee flexion LLE>into L hip external rotation stretch, Pain upon trying to get into the position, no pain in stretch position.  Seated L heel slides out and in, 2 x 10 Pain during 2nd set  Seated hip adduction/ball squeezes 2 x 10 Pain during 2nd set  Attempted seated march and seated step out/in; seated knee flexion Pain upon initiation   Assisted (belt) seated step out/in, 2 x 5 reps Minimal pain  Standing hip abduction 2 x 10 Standing hip extension 2 x 10 Sidestep R and L, 2 x 10  2#  Standing hip flexor stretch, supine hip flexor stretch, 3 x 30" Reports good relief, especially supine stretch     PATIENT EDUCATION: Education details: HEP additions-see below; discussed possibility of overuse with exercise, backing off  of multiple sets or spreading through the day.  Discussed use  of belt to help with lifting foot in and out of car. Person educated: Patient Education method: Explanation, Demonstration, and Handouts Education comprehension: verbalized understanding, returned demonstration, and needs further education   HOME EXERCISE PROGRAM: Access Code: [email or web address] URL: [email or web address] Date: 05/08/2023 Prepared by: [facility] - Outpatient  Rehab - [facility]  Exercises - Small Range Straight Leg Raise  - 1 x daily - 5 x weekly - 3 sets - 10 reps - 3 sec hold - Straight Leg Raise with External Rotation  - 1 x daily - 5 x weekly - 3 sets - 10 reps - 3 sec hold - Standing Hamstring Curl with Chair Support  - 1 x daily - 7 x weekly - 3 sets - 10 reps - Standing Hip Abduction with Ankle Weight  - 1 x daily - 4 x weekly - 3 sets - 10 reps - Standing Hip Extension with Ankle Weight  - 1 x daily - 4 x weekly - 3 sets - 10 reps - Standing Marching  - 1 x daily - 4 x weekly - 3 sets - 10 reps - Side Stepping with Counter Support  - 1 x daily - 4 x weekly - 3 sets - 10 reps - Hip Flexor/Ankle stretch  - 1-2 x daily - 7 x weekly - 1 sets - 3-5 reps - 30-60 sec hold     ------------------------------------------------------------ Objective measures below taken at initial evaluation:  DIAGNOSTIC FINDINGS: NA for this episode  COGNITION: Overall cognitive status: Within functional limits for tasks assessed   SENSATION: Light touch: WFL  MUSCLE TONE: RLE: Mild  POSTURE: rounded shoulders and forward head  LOWER EXTREMITY ROM:   WFL in sitting  Active  Right Eval Left Eval  Hip flexion  pain  Hip extension    Hip abduction    Hip adduction    Hip internal rotation    Hip external rotation    Knee flexion    Knee extension    Ankle dorsiflexion    Ankle plantarflexion    Ankle inversion    Ankle eversion     (Blank rows = not tested)  LOWER EXTREMITY MMT:  Grossly tested in sitting  MMT Right Eval Left Eval  Hip flexion 4+ 4   Hip extension    Hip abduction    Hip adduction    Hip internal rotation    Hip external rotation    Knee flexion 4+ 4+  Knee extension 4+ 4+  Ankle dorsiflexion 4 4  Ankle plantarflexion    Ankle inversion    Ankle eversion    (Blank rows = not tested)  TRANSFERS: Assistive device utilized: None  Sit to stand: Modified independence Stand to sit: Modified independence  GAIT: Gait pattern: step through pattern, decreased arm swing- Right, decreased arm swing- Left, decreased stride length, and narrow BOS Distance walked: 100 ft Assistive device utilized:  single walking pole and None Level of assistance: Modified independence Comments: Guarded gait pattern  FUNCTIONAL TESTS:  5 times sit to stand: 12.31 sec Timed up and go (TUG): 14.34 sec 10 M:  12.1 sec = 2.7 ft/sec MiniBESTest: 22/28  TUG cognitive:14.56 sec  TUG manual:  14.62 sec  Full 360 turn:  8 steps  55M walk backwards:  16.10 sec = 0.62 ft/sec   M-CTSIB  Condition 1: Firm Surface, EO 30 Sec, Normal Sway  Condition 2: Firm Surface, EC 30 Sec,  Mild Sway  Condition 3: Foam Surface, EO 30 Sec, Mild Sway  Condition 4: Foam Surface, EC 29.69 Sec, Moderate and Severe Sway    PATIENT SURVEYS:  ABC scale 78.75 (walking across parking lot is 50%, stepping off escalator is 60%  TODAY'S TREATMENT:                                                                                                                              DATE: 03/06/2023    PATIENT EDUCATION: Education details: Eval results, POC Person educated: Patient Education method: Explanation Education comprehension: verbalized understanding  HOME EXERCISE PROGRAM: Not yet initiated   GOALS: Goals reviewed with patient? Yes  SHORT TERM GOALS: Target date: 04/06/2023>UPDATED TARGET DATE 05/18/2023  Pt will be independent with HEP for improved balance, gait, strength. Baseline:new HEP provided 04/25/2023 Goal status: IN PROGRESS  2.  Pt will improve 5x  sit<>stand to less than or equal to 11 sec to demonstrate improved functional strength and transfer efficiency. Baseline: 12.31 sec; 1375 sec 04/25/2023 Goal status: IN PROGRESS  3.  Pt will improve 57M walk backwards to at least 1 ft/sec, without walking pole, for improved balance. Baseline: 0.62 ft/sec> 1.07 ft/sec with walking pole 04/25/2023  Goal status: REVISED  4.  Pt will improve MCTSIB Condition 4 to minimal sway for improved balance.  Baseline: mod sway 04/25/2023 Goal status:IN PROGRESS  LONG TERM GOALS: Target date: 04/20/2023>UPDATED TARGET DATE 06/08/2023  Pt will be Independent with HEP for improved balance, strength, gait. Baseline:  Goal status: IN PROGRESS  2.  Pt will improve MiniBESTest score to at least 24/28 to decrease fall risk. Baseline: 22/28 Goal status: IN PROGRESS  3.  Pt will improve ABC score to at least 85% for improved overall balance confidence  Baseline: 78.75% Goal status: IN PROGRESS  4.  Pt will report improved confidence in curb, outdoor surface negotiation by at least 50% for improved community mobility. Baseline:  Goal status: IN PROGRESS   ASSESSMENT:  CLINICAL IMPRESSION: Pt reports working on exercises throughout the weekend; did not yet have weights yet, but has ordered them.  She reports having bout of 10/10 pain last night.  Pt has pain with seated knee/hip flexion and seated lateral hip motion, which extends from medial knee into medial quads up towards hip.  With gentle ROM exercises, it does not increase until 2nd set of exercises.  Weighted standing hip exercises do not cause pain.  Discussed ways to help lessen pain, as it may be related to overuse/compensations of less painful movement at this point-ice painful area, assist with belt to lift leg in and out of car, stretch well through L quads.  She notes relief by end of session and will continue to benefit from continued skilled PT to address strength and balance for improved  functional mobility and decreased fall risk.  OBJECTIVE IMPAIRMENTS: Abnormal gait, decreased balance, decreased mobility, difficulty walking, decreased strength, and postural dysfunction.  ACTIVITY LIMITATIONS: standing, stairs, transfers, locomotion level, and caring for others  PARTICIPATION LIMITATIONS: shopping and community activity  PERSONAL FACTORS: 3+ comorbidities: see PMH above  are also affecting patient's functional outcome.   REHAB POTENTIAL: Good  CLINICAL DECISION MAKING: Evolving/moderate complexity  EVALUATION COMPLEXITY: Moderate  PLAN:  PT FREQUENCY: 2x/week  PT DURATION: 6 weeks plus eval week  PLANNED INTERVENTIONS: Therapeutic exercises, Therapeutic activity, Neuromuscular re-education, Balance training, Gait training, Patient/Family education, and Self Care  PLAN FOR NEXT SESSION: Consider kinesiotape for L knee pain.  Ask about knee pain and how stretch/spreading out exercises are going.  Continue working on balance-weightshifting, EO and EC foam surfaces, SLS, curb and stair negotiation, unlevel surfaces   Lonia Blood, PT 05/08/23 12:02 PM Phone: [phone] Fax: [phone]   [facility] at [facility] [address] [place], [address] [place], [place] [address] Phone # [phone] Fax # [phone]

## 2023-05-09 ENCOUNTER — Encounter: Payer: Self-pay | Admitting: Family Medicine

## 2023-05-09 ENCOUNTER — Ambulatory Visit (INDEPENDENT_AMBULATORY_CARE_PROVIDER_SITE_OTHER): Payer: Medicare Other | Admitting: Family Medicine

## 2023-05-09 VITALS — BP 138/78 | HR 73 | Temp 97.0°F | Ht 63.0 in | Wt 239.6 lb

## 2023-05-09 DIAGNOSIS — M1712 Unilateral primary osteoarthritis, left knee: Secondary | ICD-10-CM

## 2023-05-09 DIAGNOSIS — I10 Essential (primary) hypertension: Secondary | ICD-10-CM

## 2023-05-09 DIAGNOSIS — F418 Other specified anxiety disorders: Secondary | ICD-10-CM | POA: Diagnosis not present

## 2023-05-09 DIAGNOSIS — E538 Deficiency of other specified B group vitamins: Secondary | ICD-10-CM

## 2023-05-09 DIAGNOSIS — Z23 Encounter for immunization: Secondary | ICD-10-CM | POA: Diagnosis not present

## 2023-05-09 NOTE — Patient Instructions (Addendum)
Let us know when you get your COVID vaccine this fall.  Glad you have improved so much! I hope the knee gets better just like your mental health has improved  Recommended follow up: Return in about 6 months (around 11/06/2023) for followup or sooner if needed.Schedule b4 you leave.

## 2023-05-09 NOTE — Progress Notes (Signed)
Phone [phone] In person visit   Subjective:   Kristin [name] is a 72 y.o. year old very pleasant female patient who presents for/with See problem oriented charting Chief Complaint  Patient presents with   parkinsons disease    Pt here for 6 week f/u    Past Medical History-  Patient Active Problem List   Diagnosis Date Noted   Coronary artery disease involving native coronary artery of native heart without angina pectoris 06/12/2019    Priority: High   Rheumatoid arthritis (HCC) 01/08/2018    Priority: High   Constipation 12/07/2015    Priority: High   Parkinson's disease (HCC) 05/11/2014    Priority: High   Vitamin B 12 deficiency 05/30/2021    Priority: Medium    Vitamin D deficiency 12/20/2016    Priority: Medium    Prediabetes 12/20/2016    Priority: Medium    Shortness of breath on exertion 11/27/2016    Priority: Medium    Morbid obesity (HCC)     Priority: Medium    Depression with anxiety 11/02/2009    Priority: Medium    Essential hypertension 07/05/2007    Priority: Medium    Osteoarthritis 07/05/2007    Priority: Medium    Hyperlipidemia 06/26/2007    Priority: Medium    H/O: iron deficiency anemia 05/08/2007    Priority: Medium    Hiatal hernia with GERD 05/08/2007    Priority: Medium    Anemia 03/16/2020    Priority: Low   Diastolic dysfunction 05/16/2019    Priority: Low   BCC (basal cell carcinoma of skin) 10/05/2014    Priority: Low   Rosacea 10/05/2014    Priority: Low   Screening for cervical cancer 07/17/2012    Priority: Low   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   Foot pain, bilateral 06/19/2012    Priority: 1.   Lumbar back pain with radiculopathy affecting lower extremity 10/17/2007    Priority: 1.    Medications- reviewed and updated Current Outpatient Medications  Medication Sig Dispense Refill    carbidopa-levodopa (SINEMET CR) 50-200 MG tablet TAKE 1 TABLET BY MOUTH EVERYDAY AT BEDTIME 90 tablet 1   carbidopa-levodopa (SINEMET IR) 25-100 MG tablet TAKE 2 TABLETS BY MOUTH AT 7AM, 2TABLETS AT 11AM, 1 TABLET AT 4PM 450 tablet 2   carvedilol (COREG) 3.125 MG tablet TAKE 1 TABLET BY MOUTH TWICE A DAY WITH MEALS 180 tablet 3   DULoxetine (CYMBALTA) 30 MG capsule TAKE 1 CAPSULE BY MOUTH EVERY DAY 90 capsule 1   entacapone (COMTAN) 200 MG tablet Take 1 tablet (200 mg total) by mouth 3 (three) times daily. 270 tablet 1   escitalopram (LEXAPRO) 20 MG tablet Take 1 tablet (20 mg total) by mouth daily. 90 tablet 3   folic acid (FOLVITE) 1 MG tablet Take 1 mg by mouth 2 (two) times daily.     losartan (COZAAR) 50 MG tablet TAKE 1 TABLET BY MOUTH EVERY DAY 90 tablet 3   omeprazole (PRILOSEC) 20 MG capsule TAKE 1 CAPSULE BY MOUTH 2 TIMES DAILY AS NEEDED. 180 capsule 1   polyethylene glycol (MIRALAX / GLYCOLAX) 17 g packet Take 17 g by mouth daily.     pramipexole (MIRAPEX) 0.5 MG tablet Take 1 tablet (0.5 mg total) by mouth 3 (three) times daily. 270 tablet 1   rosuvastatin (CRESTOR) 10 MG  tablet TAKE 1 TABLET BY MOUTH ON MONDAYS,WEDNESDAYS, AND FRIDAYS 39 tablet 3   TRULANCE 3 MG TABS TAKE 1 TABLET BY MOUTH EVERY DAY 30 tablet 1   Vitamin D, Ergocalciferol, (DRISDOL) 1.25 MG (50000 UNIT) CAPS capsule TAKE 1 CAPSULE (50,000 UNITS TOTAL) BY MOUTH EVERY 7 (SEVEN) DAYS 13 capsule 1   ALPRAZolam (XANAX) 0.25 MG tablet TAKE 1 TABLET BY MOUTH 2 TIMES DAILY AS NEEDED FOR ANXIETY. (Patient not taking: Reported on 05/09/2023) 30 tablet 1   No current facility-administered medications for this visit.     Objective:  BP 138/78   Pulse 73   Temp (!) 97 F (36.1 C)   Ht 5\' 3"  (1.6 m)   Wt 239 lb 9.6 oz (108.7 kg)   SpO2 97%   BMI 42.44 kg/m  Gen: NAD, resting comfortably CV: RRR no murmurs rubs or gallops Lungs: CTAB no crackles, wheeze, rhonchi Ext: trace edema Skin: warm, dry    Assessment and  Plan   # Depression with anxiety- family history and mother bipolar as well S: Medication:Lexapro 20 mg (worsening depression when we went to 10 mg along with heavy reflection on childhood and other stressors), Cymbalta 30 mg, sparing alprazolam 0.25 mg -has seen virtual therapist before last visit and has had more sessions-finding that helpful. Has had some good discussions with her sister.  -reports anxiety scores significatnly improved with therapist -sees Dr. Vanetta Shawl in October- plans to keep this to see if can helpw ith sleep    05/09/2023    9:28 AM 03/28/2023    8:50 AM 03/06/2023   11:13 AM  Depression screen PHQ 2/9  Decreased Interest 0 1 0  Down, Depressed, Hopeless 0 3 0  PHQ - 2 Score 0 4 0  Altered sleeping 3 2 0  Tired, decreased energy 1 2 0  Change in appetite 2 0 0  Feeling bad or failure about yourself  1 2 0  Trouble concentrating 0 2 0  Moving slowly or fidgety/restless 2 2 0  Suicidal thoughts 0 1 0  PHQ-9 Score 9 15 0  Difficult doing work/chores Somewhat difficult  Not difficult at all  A/P: depression seems to be improving with medication(s) changes and with therapy- she wants to continue to work with therapist and remain on same medicine- she is improving- continue to monitor  - no signs of serotonin syndrome - she read through literature  # Left knee pain S:bumped her left knee getting down onto floor back in may and has had more pain- had recent steroid shot with [facility] .  A/P: apparently still has 3 gel shots planned at later date to try to help.  - also doing physical therapy for parkinsons  # B12 deficiency S: Current treatment/medication (oral vs. IM):  was off B12 at last visit- advised to take 1000 mcg daily on 03/30/23 through [facility] Results  Component Value Date   VITAMINB12 241 03/28/2023  A/P: suspect improving- can recheck next bloodwork   #gastroparesis- limited diet recommended by Dr. Lavon Paganini but has been difficult for her to  follow. Endoscopy 10/06/22 was not able to be completed  -has to be careful about her food choices and makes weight loss more challenging  #hypertension S: medication: Losartan 50 mg, coreg 3.125 mg BID BP Readings from Last 3 Encounters:  05/09/23 138/78  03/28/23 120/64  03/22/23 (!) 118/92  A/P:  high acceptable range- continue current medications   Recommended follow up: Return in about 6 months (  around 11/06/2023) for followup or sooner if needed.Schedule b4 you leave. Future Appointments  Date Time Provider Department Center  05/10/2023  8:00 AM Gean Maidens, PT [facility]  05/18/2023  8:45 AM Gean Maidens, PT [facility]  05/21/2023  3:30 PM Gean Maidens, PT [facility]  05/30/2023  9:30 AM Napoleon Form, MD [facility]  06/04/2023  8:00 AM Chilton Si, MD [facility]  06/05/2023  1:30 PM Neysa Hotter, MD [facility] None  09/20/2023  8:45 AM Tat, Octaviano Batty, DO [facility] None  03/13/2024 10:45 AM [facility] ANNUAL WELLNESS VISIT 1 [facility]  b Lab/Order associations:   ICD-10-CM   1. Depression with anxiety  F41.8     2. Essential hypertension  I10     3. Encounter for immunization  Z23 Flu Vaccine Trivalent High Dose (Fluad)    4. Vitamin B 12 deficiency  E53.8     5. Osteoarthritis of left knee, unspecified osteoarthritis type  M17.12       No orders of the defined types were placed in this encounter.   Return precautions advised.  Tana Conch, MD

## 2023-05-10 ENCOUNTER — Ambulatory Visit: Payer: Medicare Other | Admitting: Physical Therapy

## 2023-05-10 ENCOUNTER — Encounter: Payer: Self-pay | Admitting: Physical Therapy

## 2023-05-10 DIAGNOSIS — R2689 Other abnormalities of gait and mobility: Secondary | ICD-10-CM | POA: Diagnosis not present

## 2023-05-10 DIAGNOSIS — M6281 Muscle weakness (generalized): Secondary | ICD-10-CM | POA: Diagnosis not present

## 2023-05-10 DIAGNOSIS — R29818 Other symptoms and signs involving the nervous system: Secondary | ICD-10-CM | POA: Diagnosis not present

## 2023-05-10 DIAGNOSIS — R2681 Unsteadiness on feet: Secondary | ICD-10-CM | POA: Diagnosis not present

## 2023-05-10 NOTE — Therapy (Signed)
OUTPATIENT PHYSICAL THERAPY NEURO TREATMENT   Patient Name: Kristin [name] MRN: [id number] DOB:[birth year], 72 y.o., female Today's Date: 05/10/2023   PCP: Shelva Majestic, MD REFERRING PROVIDER: Vladimir Faster, DO   END OF SESSION:  PT End of Session - 05/10/23 0813     Visit Number 8    Number of Visits 16    Date for PT Re-Evaluation 06/08/23    Authorization Type Medicare/BCBS    Progress Note Due on Visit 10    PT Start Time [phone]   pt arrives late   PT Stop Time 0841    PT Time Calculation (min) 30 min    Activity Tolerance Patient tolerated treatment well    Behavior During Therapy [facility] for tasks assessed/performed;Anxious                   Past Medical History:  Diagnosis Date   Abnormal vaginal Pap smear    Acute pharyngitis 02/25/2013   Anemia    Anxiety    Arthritis    BCC (basal cell carcinoma of skin) 10/05/2014   On back   Chicken pox as a child   Chronic UTI    sees dr Vonita Moss   Constipation 12/07/2015   Depression with anxiety 11/02/2009   Qualifier: Diagnosis of  By: Mayford Knife, LPN, Bonnye M    Dermatitis 07/17/2012   Esophageal stricture 1994   Fibroids    Foot pain, bilateral 06/19/2012   GERD (gastroesophageal reflux disease)    Hiatal hernia    Hyperglycemia 08/19/2013   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   Hyperlipidemia    Hypertension    Infertility, female    Low back pain 10/17/2007   Qualifier: Diagnosis of  By: Lovell Sheehan MD, [name]    Measles as a child   Obesity    Osteoarthritis    Parkinson disease    Plantar fasciitis of left foot 06/19/2012   Preventative health care 12/19/2015   Rheumatoid arthritis (HCC) 01/08/2018   Rosacea 10/05/2014   Swallowing difficulty    Urinary frequency 02/25/2013   Visual floaters 05/11/2014   Past Surgical History:  Procedure Laterality Date   ABDOMINAL HYSTERECTOMY  2006   total   esophageal     stretching   HERNIA REPAIR N/A 2022   hiatal hernia   laporoscopy     LEFT  HEART CATH AND CORONARY ANGIOGRAPHY N/A 06/02/2019   Procedure: LEFT HEART CATH AND CORONARY ANGIOGRAPHY;  Surgeon: Corky Crafts, MD;  Location: [facility];  Service: Cardiovascular;  Laterality: N/A;   TONSILLECTOMY     TOTAL HIP ARTHROPLASTY Right 2010   UPPER GASTROINTESTINAL ENDOSCOPY     wisdom teeth extracted     Patient Active Problem List   Diagnosis Date Noted   Vitamin B 12 deficiency 05/30/2021   Anemia 03/16/2020   Coronary artery disease involving native coronary artery of native heart without angina pectoris 06/12/2019   Diastolic dysfunction 05/16/2019   Rheumatoid arthritis (HCC) 01/08/2018   Hand pain, right 10/15/2017   Vitamin D deficiency 12/20/2016   Prediabetes 12/20/2016   Shortness of breath on exertion 11/27/2016   Morbid obesity (HCC)    Constipation 12/07/2015   BCC (basal cell carcinoma of skin) 10/05/2014   Rosacea 10/05/2014   Parkinson's disease (HCC) 05/11/2014   Screening for cervical cancer 07/17/2012   Foot pain, bilateral 06/19/2012   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   Depression with anxiety 11/02/2009   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   Lumbar back  pain with radiculopathy affecting lower extremity 10/17/2007   Essential hypertension 07/05/2007   Osteoarthritis 07/05/2007   Hyperlipidemia 06/26/2007   H/O: iron deficiency anemia 05/08/2007   Hiatal hernia with GERD 05/08/2007    ONSET DATE: 02/15/23 (PD screen)  REFERRING DIAG: G20.A1 (ICD-10-CM) - Parkinson's disease without dyskinesia or fluctuating manifestations   THERAPY DIAG:  Unsteadiness on feet  Muscle weakness (generalized)  Other abnormalities of gait and mobility  Rationale for Evaluation and Treatment: Rehabilitation  SUBJECTIVE:                                                                                                                                                                                             SUBJECTIVE  STATEMENT: The knee is really bothering me.  It was hard to get ready this morning.  Pt reports this is what happens when she gets flustered and in a hurry-she sweats, then has difficulty completing her ADLs-had a difficult time getting shoes on.  Pt accompanied by: self  PERTINENT HISTORY: PD, anxiety, see additional PMH above  PAIN:  Are you having pain? Yes: NPRS scale: 8/10 Pain location: L knee Pain description: sore Aggravating factors: lifting leg to get into car , bone on bone arthritis Relieving factors: Aleve, cortisone shot; walking actually helps   PRECAUTIONS: Fall 03/19/2023-No squats, no lunges, until the pain feels better.  WEIGHT BEARING RESTRICTIONS: No  FALLS: Has patient fallen in last 6 months? No  LIVING ENVIRONMENT: Lives with: lives with their family Lives in: House/apartment Stairs: Yes: Internal: 12 steps; bilateral but cannot reach both Has following equipment at home:  single walking pole  PLOF: Independent with household mobility without device and Independent with community mobility with device  PATIENT GOALS: To get rid of fear of falling  OBJECTIVE:    TODAY'S TREATMENT: 05/10/2023 Activity Comments  K-tape to L knee-lateral and medial strip around patella, strips superior and inferior to knee Reports good support and no c/o pain  Reviewed supine hip flexor stretch Good return demo  Gait x 100 ft, 2 reps Walking pole  Forward/back walking in parallel bars, 5 reps 1 UE support  Sidestep R and L in parallel bars, 5 reps 1 UE support  Heel/toe raises 2 x 10 reps, wall bumps 2 x 10   Quarter turns R and L Good foot clearance         PATIENT EDUCATION: Education details: purpose/benefits of kinesiotape; wear and care of kinesiotape; breathing (4-count)/mindfulness exercises throughout the day Person educated: Patient Education method: Explanation, Demonstration, and Handouts Education comprehension: verbalized understanding, returned  demonstration, and needs further education   HOME  EXERCISE PROGRAM: Access Code: [email or web address] URL: [email or web address] Date: 05/08/2023 Prepared by: [facility]  Re[facility] - [facility]  Exercises - Small Range Straight Leg Raise  - 1 x daily - 5 x weekly - 3 sets - 10 reps - 3 sec hold - Straight Leg Raise with External Rotation  - 1 x daily - 5 x weekly - 3 sets - 10 reps - 3 sec hold - Standing Hamstring Curl with Chair Support  - 1 x daily - 7 x weekly - 3 sets - 10 reps - Standing Hip Abduction with Ankle Weight  - 1 x daily - 4 x weekly - 3 sets - 10 reps - Standing Hip Extension with Ankle Weight  - 1 x daily - 4 x weekly - 3 sets - 10 reps - Standing Marching  - 1 x daily - 4 x weekly - 3 sets - 10 reps - Side Stepping with Counter Support  - 1 x daily - 4 x weekly - 3 sets - 10 reps - Hip Flexor/Ankle stretch  - 1-2 x daily - 7 x weekly - 1 sets - 3-5 reps - 30-60 sec hold     ------------------------------------------------------------ Objective measures below taken at initial evaluation:  DIAGNOSTIC FINDINGS: NA for this episode  COGNITION: Overall cognitive status: Within functional limits for tasks assessed   SENSATION: Light touch: WFL  MUSCLE TONE: RLE: Mild  POSTURE: rounded shoulders and forward head  LOWER EXTREMITY ROM:   WFL in sitting  Active  Right Eval Left Eval  Hip flexion  pain  Hip extension    Hip abduction    Hip adduction    Hip internal rotation    Hip external rotation    Knee flexion    Knee extension    Ankle dorsiflexion    Ankle plantarflexion    Ankle inversion    Ankle eversion     (Blank rows = not tested)  LOWER EXTREMITY MMT:  Grossly tested in sitting  MMT Right Eval Left Eval  Hip flexion 4+ 4  Hip extension    Hip abduction    Hip adduction    Hip internal rotation    Hip external rotation    Knee flexion 4+ 4+  Knee extension 4+ 4+  Ankle dorsiflexion 4 4  Ankle plantarflexion     Ankle inversion    Ankle eversion    (Blank rows = not tested)  TRANSFERS: Assistive device utilized: None  Sit to stand: Modified independence Stand to sit: Modified independence  GAIT: Gait pattern: step through pattern, decreased arm swing- Right, decreased arm swing- Left, decreased stride length, and narrow BOS Distance walked: 100 ft Assistive device utilized:  single walking pole and None Level of assistance: Modified independence Comments: Guarded gait pattern  FUNCTIONAL TESTS:  5 times sit to stand: 12.31 sec Timed up and go (TUG): 14.34 sec 10 M:  12.1 sec = 2.7 ft/sec MiniBESTest: 22/28  TUG cognitive:14.56 sec  TUG manual:  14.62 sec  Full 360 turn:  8 steps  58M walk backwards:  16.10 sec = 0.62 ft/sec   M-CTSIB  Condition 1: Firm Surface, EO 30 Sec, Normal Sway  Condition 2: Firm Surface, EC 30 Sec, Mild Sway  Condition 3: Foam Surface, EO 30 Sec, Mild Sway  Condition 4: Foam Surface, EC 29.69 Sec, Moderate and Severe Sway    PATIENT SURVEYS:  ABC scale 78.75 (walking across parking lot is 50%, stepping off escalator is 60%  TODAY'S  TREATMENT:                                                                                                                              DATE: 03/06/2023    PATIENT EDUCATION: Education details: Eval results, POC Person educated: Patient Education method: Explanation Education comprehension: verbalized understanding  HOME EXERCISE PROGRAM: Not yet initiated   GOALS: Goals reviewed with patient? Yes  SHORT TERM GOALS: Target date: 04/06/2023>UPDATED TARGET DATE 05/18/2023  Pt will be independent with HEP for improved balance, gait, strength. Baseline:new HEP provided 04/25/2023 Goal status: IN PROGRESS  2.  Pt will improve 5x sit<>stand to less than or equal to 11 sec to demonstrate improved functional strength and transfer efficiency. Baseline: 12.31 sec; 1375 sec 04/25/2023 Goal status: IN PROGRESS  3.  Pt will improve  71M walk backwards to at least 1 ft/sec, without walking pole, for improved balance. Baseline: 0.62 ft/sec> 1.07 ft/sec with walking pole 04/25/2023  Goal status: REVISED  4.  Pt will improve MCTSIB Condition 4 to minimal sway for improved balance.  Baseline: mod sway 04/25/2023 Goal status:IN PROGRESS  LONG TERM GOALS: Target date: 04/20/2023>UPDATED TARGET DATE 06/08/2023  Pt will be Independent with HEP for improved balance, strength, gait. Baseline:  Goal status: IN PROGRESS  2.  Pt will improve MiniBESTest score to at least 24/28 to decrease fall risk. Baseline: 22/28 Goal status: IN PROGRESS  3.  Pt will improve ABC score to at least 85% for improved overall balance confidence  Baseline: 78.75% Goal status: IN PROGRESS  4.  Pt will report improved confidence in curb, outdoor surface negotiation by at least 50% for improved community mobility. Baseline:  Goal status: IN PROGRESS   ASSESSMENT:  CLINICAL IMPRESSION: Shortened session today, as pt arrives late.  She reports getting flustered due to almost being late, needing some assistance with getting ready this morning, and knee really bothering her this morning as she was getting ready, reports 8/10 pain.  Discussed benefits of kinesiotape, and pt agrees to try.  Utilized Kinesio tape as support around L knee, and pt reports less pain and feeling of improved support during remainder of session activities.  Worked on balance, ankle/hip strategies, with pt requiring UE support for stability.  She will continue to benefit from continued skilled PT to address strength and balance for improved functional mobility and decreased fall risk.  OBJECTIVE IMPAIRMENTS: Abnormal gait, decreased balance, decreased mobility, difficulty walking, decreased strength, and postural dysfunction.   ACTIVITY LIMITATIONS: standing, stairs, transfers, locomotion level, and caring for others  PARTICIPATION LIMITATIONS: shopping and community  activity  PERSONAL FACTORS: 3+ comorbidities: see PMH above  are also affecting patient's functional outcome.   REHAB POTENTIAL: Good  CLINICAL DECISION MAKING: Evolving/moderate complexity  EVALUATION COMPLEXITY: Moderate  PLAN:  PT FREQUENCY: 2x/week  PT DURATION: 6 weeks plus eval week  PLANNED INTERVENTIONS: Therapeutic exercises, Therapeutic activity, Neuromuscular re-education, Balance training, Gait training, Patient/Family education, Self Care, and  Taping  PLAN FOR NEXT SESSION: Ask how kinesiotape did for L knee pain and retape as needed.  Check STGs (make more appts).  Continue working on balance-weightshifting, EO and EC foam surfaces, SLS, curb and stair negotiation, unlevel surfaces   Lonia Blood, PT 05/10/23 9:36 AM Phone: [phone] Fax: [phone]   [facility] at [facility] [address] [place], [address] [place], [place] [address] Phone # [phone] Fax # [phone]

## 2023-05-11 DIAGNOSIS — N3941 Urge incontinence: Secondary | ICD-10-CM | POA: Diagnosis not present

## 2023-05-11 DIAGNOSIS — M6289 Other specified disorders of muscle: Secondary | ICD-10-CM | POA: Diagnosis not present

## 2023-05-11 DIAGNOSIS — M6281 Muscle weakness (generalized): Secondary | ICD-10-CM | POA: Diagnosis not present

## 2023-05-11 DIAGNOSIS — M62838 Other muscle spasm: Secondary | ICD-10-CM | POA: Diagnosis not present

## 2023-05-11 DIAGNOSIS — K5900 Constipation, unspecified: Secondary | ICD-10-CM | POA: Diagnosis not present

## 2023-05-17 ENCOUNTER — Ambulatory Visit: Payer: Medicare Other | Admitting: Physical Therapy

## 2023-05-18 ENCOUNTER — Encounter: Payer: Self-pay | Admitting: Physical Therapy

## 2023-05-18 ENCOUNTER — Ambulatory Visit: Payer: Medicare Other | Admitting: Physical Therapy

## 2023-05-18 DIAGNOSIS — R2681 Unsteadiness on feet: Secondary | ICD-10-CM | POA: Diagnosis not present

## 2023-05-18 DIAGNOSIS — R2689 Other abnormalities of gait and mobility: Secondary | ICD-10-CM | POA: Diagnosis not present

## 2023-05-18 DIAGNOSIS — M6281 Muscle weakness (generalized): Secondary | ICD-10-CM

## 2023-05-18 DIAGNOSIS — R29818 Other symptoms and signs involving the nervous system: Secondary | ICD-10-CM | POA: Diagnosis not present

## 2023-05-18 NOTE — Therapy (Signed)
OUTPATIENT PHYSICAL THERAPY NEURO TREATMENT/PROGRESS NOTE   Patient Name: Kristin [name] MRN: [id number] DOB:[birth year], 72 y.o., female Today's Date: 05/18/2023   PCP: Shelva Majestic, MD REFERRING PROVIDER: Vladimir Faster, DO   Progress Note Reporting Period 03/06/2023 to 05/18/2023  See note below for Objective Data and Assessment of Progress/Goals.     END OF SESSION:  PT End of Session - 05/18/23 0851     Visit Number 9    Number of Visits 16    Date for PT Re-Evaluation 06/08/23    Authorization Type Medicare/BCBS    Progress Note Due on Visit --   completed at visit 9   PT Start Time 0851    PT Stop Time 0930    PT Time Calculation (min) 39 min    Activity Tolerance Patient tolerated treatment well    Behavior During Therapy [facility] for tasks assessed/performed;Anxious                    Past Medical History:  Diagnosis Date   Abnormal vaginal Pap smear    Acute pharyngitis 02/25/2013   Anemia    Anxiety    Arthritis    BCC (basal cell carcinoma of skin) 10/05/2014   On back   Chicken pox as a child   Chronic UTI    sees dr Vonita Moss   Constipation 12/07/2015   Depression with anxiety 11/02/2009   Qualifier: Diagnosis of  By: Mayford Knife, LPN, Bonnye M    Dermatitis 07/17/2012   Esophageal stricture 1994   Fibroids    Foot pain, bilateral 06/19/2012   GERD (gastroesophageal reflux disease)    Hiatal hernia    Hyperglycemia 08/19/2013   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   Hyperlipidemia    Hypertension    Infertility, female    Low back pain 10/17/2007   Qualifier: Diagnosis of  By: Lovell Sheehan MD, [name]    Measles as a child   Obesity    Osteoarthritis    Parkinson disease    Plantar fasciitis of left foot 06/19/2012   Preventative health care 12/19/2015   Rheumatoid arthritis (HCC) 01/08/2018   Rosacea 10/05/2014   Swallowing difficulty    Urinary frequency 02/25/2013   Visual floaters 05/11/2014   Past Surgical History:  Procedure  Laterality Date   ABDOMINAL HYSTERECTOMY  2006   total   esophageal     stretching   HERNIA REPAIR N/A 2022   hiatal hernia   laporoscopy     LEFT HEART CATH AND CORONARY ANGIOGRAPHY N/A 06/02/2019   Procedure: LEFT HEART CATH AND CORONARY ANGIOGRAPHY;  Surgeon: Corky Crafts, MD;  Location: [facility];  Service: Cardiovascular;  Laterality: N/A;   TONSILLECTOMY     TOTAL HIP ARTHROPLASTY Right 2010   UPPER GASTROINTESTINAL ENDOSCOPY     wisdom teeth extracted     Patient Active Problem List   Diagnosis Date Noted   Vitamin B 12 deficiency 05/30/2021   Anemia 03/16/2020   Coronary artery disease involving native coronary artery of native heart without angina pectoris 06/12/2019   Diastolic dysfunction 05/16/2019   Rheumatoid arthritis (HCC) 01/08/2018   Hand pain, right 10/15/2017   Vitamin D deficiency 12/20/2016   Prediabetes 12/20/2016   Shortness of breath on exertion 11/27/2016   Morbid obesity (HCC)    Constipation 12/07/2015   BCC (basal cell carcinoma of skin) 10/05/2014   Rosacea 10/05/2014   Parkinson's disease (HCC) 05/11/2014   Screening for cervical cancer 07/17/2012  Foot pain, bilateral 06/19/2012   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   Depression with anxiety 11/02/2009   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   Lumbar back pain with radiculopathy affecting lower extremity 10/17/2007   Essential hypertension 07/05/2007   Osteoarthritis 07/05/2007   Hyperlipidemia 06/26/2007   H/O: iron deficiency anemia 05/08/2007   Hiatal hernia with GERD 05/08/2007    ONSET DATE: 02/15/23 (PD screen)  REFERRING DIAG: G20.A1 (ICD-10-CM) - Parkinson's disease without dyskinesia or fluctuating manifestations   THERAPY DIAG:  Unsteadiness on feet  Muscle weakness (generalized)  Other abnormalities of gait and mobility  Rationale for Evaluation and Treatment: Rehabilitation  SUBJECTIVE:                                                                                                                                                                                              SUBJECTIVE STATEMENT: Hoping you will change my tape today.  My knee is bothering me, but the tape helped.  Reports feeling anxious about balance and fearful of falling.  Pt accompanied by: self  PERTINENT HISTORY: PD, anxiety, see additional PMH above  PAIN:  Are you having pain? Yes: NPRS scale: 8/10 Pain location: L knee Pain description: sore Aggravating factors: lifting leg to get into car , bone on bone arthritis Relieving factors: Aleve, cortisone shot; walking actually helps   PRECAUTIONS: Fall 03/19/2023-No squats, no lunges, until the pain feels better.  WEIGHT BEARING RESTRICTIONS: No  FALLS: Has patient fallen in last 6 months? No  LIVING ENVIRONMENT: Lives with: lives with their family Lives in: House/apartment Stairs: Yes: Internal: 12 steps; bilateral but cannot reach both Has following equipment at home:  single walking pole  PLOF: Independent with household mobility without device and Independent with community mobility with device  PATIENT GOALS: To get rid of fear of falling  OBJECTIVE:    TODAY'S TREATMENT: 05/18/2023 Activity Comments  K-tape to L knee: lateral and medial strip around patella, strips superior and inferior to knee Reports good support, no pain  3 M walk back:  10.56 sec 0.95 ft/sec  FTSTS:  13.94 sec   Corner balance: Feet apart/feet together EO and EC head turns/head nods 5 reps Mild sway  Sit to stand x 5 from mat        M-CTSIB  Condition 1: Firm Surface, EO 30 Sec, Normal Sway  Condition 2: Firm Surface, EC 30 Sec, Mild Sway  Condition 3: Foam Surface, EO 30 Sec, Mild Sway  Condition 4: Foam Surface, EC 20.69 Sec, Moderate and Severe Sway   HOME EXERCISE PROGRAM: Access Code: [email or web address] URL: [email or web address] Date: 05/18/2023 Prepared by: [facility] -  Outpatient  Rehab - [facility]  Exercises - Small Range Straight Leg Raise  - 1 x daily - 5 x weekly - 3 sets - 10 reps - 3 sec hold - Straight Leg Raise with External Rotation  - 1 x daily - 5 x weekly - 3 sets - 10 reps - 3 sec hold - Standing Hamstring Curl with Chair Support  - 1 x daily - 7 x weekly - 3 sets - 10 reps - Standing Hip Abduction with Ankle Weight  - 1 x daily - 4 x weekly - 3 sets - 10 reps - Standing Hip Extension with Ankle Weight  - 1 x daily - 4 x weekly - 3 sets - 10 reps - Standing Marching  - 1 x daily - 4 x weekly - 3 sets - 10 reps - Side Stepping with Counter Support  - 1 x daily - 4 x weekly - 3 sets - 10 reps - Hip Flexor/Ankle strengthening  - 1-2 x daily - 7 x weekly - 1 sets - 3-5 reps - 30-60 sec hold - Sit to Stand  - 1 x daily - 7 x weekly - 3 sets - 5 reps - Standing Balance in Corner  - 1 x daily - 7 x weekly - 1 sets - 5 reps        PATIENT EDUCATION: Education details: reviewed purpose/benefits of kinesiotape; wear and care of kinesiotape and asked if husband could come in next visit to instruct in kinesiotape for home;additions to HEP for balance Person educated: Patient Education method: Explanation, Demonstration, and Handouts Education comprehension: verbalized understanding, returned demonstration, and needs further education       ------------------------------------------------------------ Objective measures below taken at initial evaluation:  DIAGNOSTIC FINDINGS: NA for this episode  COGNITION: Overall cognitive status: Within functional limits for tasks assessed   SENSATION: Light touch: WFL  MUSCLE TONE: RLE: Mild  POSTURE: rounded shoulders and forward head  LOWER EXTREMITY ROM:   WFL in sitting  Active  Right Eval Left Eval  Hip flexion  pain  Hip extension    Hip abduction    Hip adduction    Hip internal rotation    Hip external rotation    Knee flexion    Knee extension    Ankle dorsiflexion    Ankle plantarflexion    Ankle  inversion    Ankle eversion     (Blank rows = not tested)  LOWER EXTREMITY MMT:  Grossly tested in sitting  MMT Right Eval Left Eval  Hip flexion 4+ 4  Hip extension    Hip abduction    Hip adduction    Hip internal rotation    Hip external rotation    Knee flexion 4+ 4+  Knee extension 4+ 4+  Ankle dorsiflexion 4 4  Ankle plantarflexion    Ankle inversion    Ankle eversion    (Blank rows = not tested)  TRANSFERS: Assistive device utilized: None  Sit to stand: Modified independence Stand to sit: Modified independence  GAIT: Gait pattern: step through pattern, decreased arm swing- Right, decreased arm swing- Left, decreased stride length, and narrow BOS Distance walked: 100 ft Assistive device utilized:  single walking pole and None Level of assistance: Modified independence Comments: Guarded gait pattern  FUNCTIONAL TESTS:  5 times sit to stand: 12.31 sec Timed up and go (TUG): 14.34 sec 10 M:  12.1 sec = 2.7 ft/sec MiniBESTest: 22/28  TUG cognitive:14.56 sec  TUG manual:  14.62 sec  Full 360 turn:  8 steps  2M walk backwards:  16.10 sec = 0.62 ft/sec   M-CTSIB  Condition 1: Firm Surface, EO 30 Sec, Normal Sway  Condition 2: Firm Surface, EC 30 Sec, Mild Sway  Condition 3: Foam Surface, EO 30 Sec, Mild Sway  Condition 4: Foam Surface, EC 29.69 Sec, Moderate and Severe Sway    PATIENT SURVEYS:  ABC scale 78.75 (walking across parking lot is 50%, stepping off escalator is 60%  TODAY'S TREATMENT:                                                                                                                              DATE: 03/06/2023    PATIENT EDUCATION: Education details: Eval results, POC Person educated: Patient Education method: Explanation Education comprehension: verbalized understanding  HOME EXERCISE PROGRAM: Not yet initiated   GOALS: Goals reviewed with patient? Yes  SHORT TERM GOALS: Target date: 04/06/2023>UPDATED TARGET DATE  05/18/2023  Pt will be independent with HEP for improved balance, gait, strength. Baseline:new HEP provided 04/25/2023 Goal status: IN PROGRESS  2.  Pt will improve 5x sit<>stand to less than or equal to 11 sec to demonstrate improved functional strength and transfer efficiency. Baseline: 12.31 sec; 1375 sec 04/25/2023>13.94 sec 05/18/2023 Goal status: NOT YET MET; IN PROGRESS  3.  Pt will improve 2M walk backwards to at least 1 ft/sec, without walking pole, for improved balance. Baseline: 0.62 ft/sec> 1.07 ft/sec with walking pole 04/25/2023 Goal status: MET  4.  Pt will improve MCTSIB Condition 4 to minimal sway for improved balance.  Baseline: mod sway 04/25/2023; severe sway 20 sec 05/18/2023 Goal status:NOT MET  LONG TERM GOALS: Target date: 04/20/2023>UPDATED TARGET DATE 06/08/2023  Pt will be Independent with HEP for improved balance, strength, gait. Baseline:  Goal status: IN PROGRESS  2.  Pt will improve MiniBESTest score to at least 24/28 to decrease fall risk. Baseline: 22/28 Goal status: IN PROGRESS  3.  Pt will improve ABC score to at least 85% for improved overall balance confidence  Baseline: 78.75% Goal status: IN PROGRESS  4.  Pt will report improved confidence in curb, outdoor surface negotiation by at least 50% for improved community mobility. Baseline:  Goal status: IN PROGRESS   ASSESSMENT:  CLINICAL IMPRESSION: PROGRESS NOTE :  See objective measures above.  Pt has met STG 2.  STG 1, 3, 4 not yet met/in progress.  Pt has slightly increased time on FTSTS test, but likely this is due to pt's knee pain, which has been limiting her transfers and mobility.  She does report improved pain with use of kinesiotape and change in positions.  She typically reports improvement in pain after therapy sessions, so PT continues to reinforce importance of exercise, general mobility.  Added balance exercises to HEP to reflect continued unsteadiness on MCTSIB, especially condition 4.   She will continue to benefit from skilled PT to address strength and balance for  improved functional mobility and decreased fall risk.  OBJECTIVE IMPAIRMENTS: Abnormal gait, decreased balance, decreased mobility, difficulty walking, decreased strength, and postural dysfunction.   ACTIVITY LIMITATIONS: standing, stairs, transfers, locomotion level, and caring for others  PARTICIPATION LIMITATIONS: shopping and community activity  PERSONAL FACTORS: 3+ comorbidities: see PMH above  are also affecting patient's functional outcome.   REHAB POTENTIAL: Good  CLINICAL DECISION MAKING: Evolving/moderate complexity  EVALUATION COMPLEXITY: Moderate  PLAN:  PT FREQUENCY: 2x/week  PT DURATION: 6 weeks plus eval week  PLANNED INTERVENTIONS: Therapeutic exercises, Therapeutic activity, Neuromuscular re-education, Balance training, Gait training, Patient/Family education, Self Care, and Taping  PLAN FOR NEXT SESSION: Kinesiotape instruction to pt/husband.  Review HEP progression and continue balance work.  SLS, curb and stair negotiation, unlevel surfaces   Lonia Blood, PT 05/18/23 12:20 PM Phone: [phone] Fax: [phone]   [facility] at [facility] [address], [address] [place], [place] [address] Phone # [phone] Fax # [phone]

## 2023-05-21 ENCOUNTER — Ambulatory Visit: Payer: Medicare Other | Admitting: Physical Therapy

## 2023-05-21 ENCOUNTER — Encounter: Payer: Self-pay | Admitting: Physical Therapy

## 2023-05-21 DIAGNOSIS — R29818 Other symptoms and signs involving the nervous system: Secondary | ICD-10-CM | POA: Diagnosis not present

## 2023-05-21 DIAGNOSIS — R2689 Other abnormalities of gait and mobility: Secondary | ICD-10-CM

## 2023-05-21 DIAGNOSIS — M6281 Muscle weakness (generalized): Secondary | ICD-10-CM | POA: Diagnosis not present

## 2023-05-21 DIAGNOSIS — R2681 Unsteadiness on feet: Secondary | ICD-10-CM

## 2023-05-21 NOTE — Therapy (Signed)
OUTPATIENT PHYSICAL THERAPY NEURO TREATMENT   Patient Name: Kristin [name] MRN: [id number] DOB:1951-02-03, 72 y.o., female Today's Date: 05/21/2023   PCP: Shelva Majestic, MD REFERRING PROVIDER: Vladimir Faster, DO      END OF SESSION:  PT End of Session - 05/21/23 1522     Visit Number 10    Number of Visits 16    Date for PT Re-Evaluation 06/08/23    Authorization Type Medicare/BCBS    Progress Note Due on Visit --   completed at visit 9   PT Start Time 1528    PT Stop Time 1614    PT Time Calculation (min) 46 min    Activity Tolerance Patient tolerated treatment well    Behavior During Therapy [facility] for tasks assessed/performed;Anxious                     Past Medical History:  Diagnosis Date   Abnormal vaginal Pap smear    Acute pharyngitis 02/25/2013   Anemia    Anxiety    Arthritis    BCC (basal cell carcinoma of skin) 10/05/2014   On back   Chicken pox as a child   Chronic UTI    sees dr Vonita Moss   Constipation 12/07/2015   Depression with anxiety 11/02/2009   Qualifier: Diagnosis of  By: Mayford Knife, LPN, Bonnye M    Dermatitis 07/17/2012   Esophageal stricture 1994   Fibroids    Foot pain, bilateral 06/19/2012   GERD (gastroesophageal reflux disease)    Hiatal hernia    Hyperglycemia 08/19/2013   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   Hyperlipidemia    Hypertension    Infertility, female    Low back pain 10/17/2007   Qualifier: Diagnosis of  By: Lovell Sheehan MD, [name]    Measles as a child   Obesity    Osteoarthritis    Parkinson disease    Plantar fasciitis of left foot 06/19/2012   Preventative health care 12/19/2015   Rheumatoid arthritis (HCC) 01/08/2018   Rosacea 10/05/2014   Swallowing difficulty    Urinary frequency 02/25/2013   Visual floaters 05/11/2014   Past Surgical History:  Procedure Laterality Date   ABDOMINAL HYSTERECTOMY  2006   total   esophageal     stretching   HERNIA REPAIR N/A 2022   hiatal hernia    laporoscopy     LEFT HEART CATH AND CORONARY ANGIOGRAPHY N/A 06/02/2019   Procedure: LEFT HEART CATH AND CORONARY ANGIOGRAPHY;  Surgeon: Corky Crafts, MD;  Location: [facility];  Service: Cardiovascular;  Laterality: N/A;   TONSILLECTOMY     TOTAL HIP ARTHROPLASTY Right 2010   UPPER GASTROINTESTINAL ENDOSCOPY     wisdom teeth extracted     Patient Active Problem List   Diagnosis Date Noted   Vitamin B 12 deficiency 05/30/2021   Anemia 03/16/2020   Coronary artery disease involving native coronary artery of native heart without angina pectoris 06/12/2019   Diastolic dysfunction 05/16/2019   Rheumatoid arthritis (HCC) 01/08/2018   Hand pain, right 10/15/2017   Vitamin D deficiency 12/20/2016   Prediabetes 12/20/2016   Shortness of breath on exertion 11/27/2016   Morbid obesity (HCC)    Constipation 12/07/2015   BCC (basal cell carcinoma of skin) 10/05/2014   Rosacea 10/05/2014   Parkinson's disease (HCC) 05/11/2014   Screening for cervical cancer 07/17/2012   Foot pain, bilateral 06/19/2012   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   Depression with anxiety 11/02/2009   DYSPHAGIA PHARYNGOESOPHAGEAL  PHASE 11/25/2008   Lumbar back pain with radiculopathy affecting lower extremity 10/17/2007   Essential hypertension 07/05/2007   Osteoarthritis 07/05/2007   Hyperlipidemia 06/26/2007   H/O: iron deficiency anemia 05/08/2007   Hiatal hernia with GERD 05/08/2007    ONSET DATE: 02/15/23 (PD screen)  REFERRING DIAG: G20.A1 (ICD-10-CM) - Parkinson's disease without dyskinesia or fluctuating manifestations   THERAPY DIAG:  Unsteadiness on feet  Muscle weakness (generalized)  Other abnormalities of gait and mobility  Rationale for Evaluation and Treatment: Rehabilitation  SUBJECTIVE:                                                                                                                                                                                              SUBJECTIVE STATEMENT: Husband not here today, as family is at our house and he's with them.  So I'll need to figure out this tape thing.  Pt accompanied by: self  PERTINENT HISTORY: PD, anxiety, see additional PMH above  PAIN:  Are you having pain? Yes: NPRS scale: 1-7/10 Pain location: L knee Pain description: sore Aggravating factors: lifting leg to get into car , bone on bone arthritis Relieving factors: Aleve, cortisone shot; walking actually helps   PRECAUTIONS: Fall 03/19/2023-No squats, no lunges, until the pain feels better.  WEIGHT BEARING RESTRICTIONS: No  FALLS: Has patient fallen in last 6 months? No  LIVING ENVIRONMENT: Lives with: lives with their family Lives in: House/apartment Stairs: Yes: Internal: 12 steps; bilateral but cannot reach both Has following equipment at home:  single walking pole  PLOF: Independent with household mobility without device and Independent with community mobility with device  PATIENT GOALS: To get rid of fear of falling  OBJECTIVE:    TODAY'S TREATMENT: 05/21/2023 Activity Comments  K-tape to L knee: lateral and medial strip around patella, strips superior and inferior to knee Reports good support, no pain.  Discussed option for knee brace  110 ft x 4 +50 ft with single walking pole 3 brief standing rest breaks  Reviewed corner balance exercises-performed in parallel bars: Feet apart/feet together EO and EC head turns/nods Mild sway  Standing on incline/decline-step and weightshift, then standing in place head turns/nods EO, then EC head steady x 15 sec Talked about hills she may encounter while at the beach; supervision and intermittent UE support  Stair negotation 4 reps Instructed in step-to pattern when L knee is bothering her       HOME EXERCISE PROGRAM: Access Code: [email or web address] URL: [email or web address] Date: 05/18/2023 Prepared by: [facility] - Outpatient  Rehab - [facility]  Exercises - Small  Range Straight Leg Raise  -  1 x daily - 5 x weekly - 3 sets - 10 reps - 3 sec hold - Straight Leg Raise with External Rotation  - 1 x daily - 5 x weekly - 3 sets - 10 reps - 3 sec hold - Standing Hamstring Curl with Chair Support  - 1 x daily - 7 x weekly - 3 sets - 10 reps - Standing Hip Abduction with Ankle Weight  - 1 x daily - 4 x weekly - 3 sets - 10 reps - Standing Hip Extension with Ankle Weight  - 1 x daily - 4 x weekly - 3 sets - 10 reps - Standing Marching  - 1 x daily - 4 x weekly - 3 sets - 10 reps - Side Stepping with Counter Support  - 1 x daily - 4 x weekly - 3 sets - 10 reps - Hip Flexor/Ankle strengthening  - 1-2 x daily - 7 x weekly - 1 sets - 3-5 reps - 30-60 sec hold - Sit to Stand  - 1 x daily - 7 x weekly - 3 sets - 5 reps - Standing Balance in Corner  - 1 x daily - 7 x weekly - 1 sets - 5 reps        PATIENT EDUCATION: Education details: Reviewed updates to HEP and discussed options for kinesio-tape versus knee brace/sleeve for support Person educated: Patient Education method: Explanation, Demonstration, and Handouts Education comprehension: verbalized understanding, returned demonstration, and needs further education       ------------------------------------------------------------ Objective measures below taken at initial evaluation:  DIAGNOSTIC FINDINGS: NA for this episode  COGNITION: Overall cognitive status: Within functional limits for tasks assessed   SENSATION: Light touch: WFL  MUSCLE TONE: RLE: Mild  POSTURE: rounded shoulders and forward head  LOWER EXTREMITY ROM:   WFL in sitting  Active  Right Eval Left Eval  Hip flexion  pain  Hip extension    Hip abduction    Hip adduction    Hip internal rotation    Hip external rotation    Knee flexion    Knee extension    Ankle dorsiflexion    Ankle plantarflexion    Ankle inversion    Ankle eversion     (Blank rows = not tested)  LOWER EXTREMITY MMT:  Grossly tested in  sitting  MMT Right Eval Left Eval  Hip flexion 4+ 4  Hip extension    Hip abduction    Hip adduction    Hip internal rotation    Hip external rotation    Knee flexion 4+ 4+  Knee extension 4+ 4+  Ankle dorsiflexion 4 4  Ankle plantarflexion    Ankle inversion    Ankle eversion    (Blank rows = not tested)  TRANSFERS: Assistive device utilized: None  Sit to stand: Modified independence Stand to sit: Modified independence  GAIT: Gait pattern: step through pattern, decreased arm swing- Right, decreased arm swing- Left, decreased stride length, and narrow BOS Distance walked: 100 ft Assistive device utilized:  single walking pole and None Level of assistance: Modified independence Comments: Guarded gait pattern  FUNCTIONAL TESTS:  5 times sit to stand: 12.31 sec Timed up and go (TUG): 14.34 sec 10 M:  12.1 sec = 2.7 ft/sec MiniBESTest: 22/28  TUG cognitive:14.56 sec  TUG manual:  14.62 sec  Full 360 turn:  8 steps  79M walk backwards:  16.10 sec = 0.62 ft/sec   M-CTSIB  Condition 1: Firm Surface, EO 30 Sec, Normal  Sway  Condition 2: Firm Surface, EC 30 Sec, Mild Sway  Condition 3: Foam Surface, EO 30 Sec, Mild Sway  Condition 4: Foam Surface, EC 29.69 Sec, Moderate and Severe Sway    PATIENT SURVEYS:  ABC scale 78.75 (walking across parking lot is 50%, stepping off escalator is 60%  TODAY'S TREATMENT:                                                                                                                              DATE: 03/06/2023    PATIENT EDUCATION: Education details: Eval results, POC Person educated: Patient Education method: Explanation Education comprehension: verbalized understanding  HOME EXERCISE PROGRAM: Not yet initiated   GOALS: Goals reviewed with patient? Yes  SHORT TERM GOALS: Target date: 04/06/2023>UPDATED TARGET DATE 05/18/2023  Pt will be independent with HEP for improved balance, gait, strength. Baseline:new HEP provided  04/25/2023 Goal status: IN PROGRESS  2.  Pt will improve 5x sit<>stand to less than or equal to 11 sec to demonstrate improved functional strength and transfer efficiency. Baseline: 12.31 sec; 1375 sec 04/25/2023>13.94 sec 05/18/2023 Goal status: NOT YET MET; IN PROGRESS  3.  Pt will improve 78M walk backwards to at least 1 ft/sec, without walking pole, for improved balance. Baseline: 0.62 ft/sec> 1.07 ft/sec with walking pole 04/25/2023 Goal status: MET  4.  Pt will improve MCTSIB Condition 4 to minimal sway for improved balance.  Baseline: mod sway 04/25/2023; severe sway 20 sec 05/18/2023 Goal status:NOT MET  LONG TERM GOALS: Target date: 04/20/2023>UPDATED TARGET DATE 06/08/2023  Pt will be Independent with HEP for improved balance, strength, gait. Baseline:  Goal status: IN PROGRESS  2.  Pt will improve MiniBESTest score to at least 24/28 to decrease fall risk. Baseline: 22/28 Goal status: IN PROGRESS  3.  Pt will improve ABC score to at least 85% for improved overall balance confidence  Baseline: 78.75% Goal status: IN PROGRESS  4.  Pt will report improved confidence in curb, outdoor surface negotiation by at least 50% for improved community mobility. Baseline:  Goal status: IN PROGRESS   ASSESSMENT:  CLINICAL IMPRESSION: Skilled PT session today focused on balance and stair/gait activities, with focus on education about safety with gait and balance while on her upcoming trip to the beach.  She does not c/o pain during PT session, after Kinesiotape replaced (husband not here for education on this today, so did discuss option for a knee sleeve/brace to offer support to knee).  She appears to have improved confidence with standing varied feet position and head motion exercises today; on work on incline/decline, she does prefer UE support for stability.  She will continue to benefit from skilled PT to address strength and balance for improved functional mobility and decreased fall  risk.  OBJECTIVE IMPAIRMENTS: Abnormal gait, decreased balance, decreased mobility, difficulty walking, decreased strength, and postural dysfunction.   ACTIVITY LIMITATIONS: standing, stairs, transfers, locomotion level, and caring for others  PARTICIPATION LIMITATIONS: shopping  and community activity  PERSONAL FACTORS: 3+ comorbidities: see PMH above  are also affecting patient's functional outcome.   REHAB POTENTIAL: Good  CLINICAL DECISION MAKING: Evolving/moderate complexity  EVALUATION COMPLEXITY: Moderate  PLAN:  PT FREQUENCY: 2x/week  PT DURATION: 6 weeks plus eval week  PLANNED INTERVENTIONS: Therapeutic exercises, Therapeutic activity, Neuromuscular re-education, Balance training, Gait training, Patient/Family education, Self Care, and Taping  PLAN FOR NEXT SESSION: Follow up about Kinesiotape instruction to pt/husband.  Review HEP progression and continue balance work.  SLS, curb negotiation, unlevel surfaces   Lonia Blood, PT 05/21/23 5:09 PM Phone: [phone] Fax: [phone]   [facility] at [facility] [address], [address] [place], [place] [address] Phone # [phone] Fax # [phone]

## 2023-05-28 ENCOUNTER — Ambulatory Visit: Payer: Medicare Other | Admitting: Physical Therapy

## 2023-05-28 ENCOUNTER — Telehealth: Payer: Self-pay | Admitting: Physical Therapy

## 2023-05-28 NOTE — Telephone Encounter (Signed)
Called patient regarding missed PT appointment at 1:15 today.  She was thinking her appointment was at a later time today.  Was able to offer appt for 12:30 tomorrow, which pt agreed upon.  Lonia Blood, PT 05/28/23 1:32 PM Phone: [phone] Fax: [phone]   [facility] at [facility] [address], [address] [place], [place] [address] Phone # [phone] Fax # [phone]

## 2023-05-29 ENCOUNTER — Encounter: Payer: Self-pay | Admitting: Physical Therapy

## 2023-05-29 ENCOUNTER — Ambulatory Visit: Payer: Medicare Other | Admitting: Physical Therapy

## 2023-05-29 DIAGNOSIS — R2689 Other abnormalities of gait and mobility: Secondary | ICD-10-CM | POA: Diagnosis not present

## 2023-05-29 DIAGNOSIS — M6281 Muscle weakness (generalized): Secondary | ICD-10-CM | POA: Diagnosis not present

## 2023-05-29 DIAGNOSIS — R2681 Unsteadiness on feet: Secondary | ICD-10-CM

## 2023-05-29 DIAGNOSIS — R29818 Other symptoms and signs involving the nervous system: Secondary | ICD-10-CM | POA: Diagnosis not present

## 2023-05-29 NOTE — Therapy (Signed)
OUTPATIENT PHYSICAL THERAPY NEURO TREATMENT   Patient Name: Kristin [name] MRN: [id number] DOB:1951/01/01, 72 y.o., female Today's Date: 05/29/2023   PCP: Shelva Majestic, MD REFERRING PROVIDER: Vladimir Faster, DO      END OF SESSION:  PT End of Session - 05/29/23 1227     Visit Number 11    Number of Visits 16    Date for PT Re-Evaluation 06/08/23    Authorization Type Medicare/BCBS-KX    Progress Note Due on Visit --   completed at visit 9   PT Start Time 1230    PT Stop Time 1314    PT Time Calculation (min) 44 min    Activity Tolerance Patient tolerated treatment well    Behavior During Therapy [facility] for tasks assessed/performed;Anxious                      Past Medical History:  Diagnosis Date   Abnormal vaginal Pap smear    Acute pharyngitis 02/25/2013   Anemia    Anxiety    Arthritis    BCC (basal cell carcinoma of skin) 10/05/2014   On back   Chicken pox as a child   Chronic UTI    sees dr Vonita Moss   Constipation 12/07/2015   Depression with anxiety 11/02/2009   Qualifier: Diagnosis of  By: Mayford Knife, LPN, Bonnye M    Dermatitis 07/17/2012   Esophageal stricture 1994   Fibroids    Foot pain, bilateral 06/19/2012   GERD (gastroesophageal reflux disease)    Hiatal hernia    Hyperglycemia 08/19/2013   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   Hyperlipidemia    Hypertension    Infertility, female    Low back pain 10/17/2007   Qualifier: Diagnosis of  By: Lovell Sheehan MD, [name]    Measles as a child   Obesity    Osteoarthritis    Parkinson disease    Plantar fasciitis of left foot 06/19/2012   Preventative health care 12/19/2015   Rheumatoid arthritis (HCC) 01/08/2018   Rosacea 10/05/2014   Swallowing difficulty    Urinary frequency 02/25/2013   Visual floaters 05/11/2014   Past Surgical History:  Procedure Laterality Date   ABDOMINAL HYSTERECTOMY  2006   total   esophageal     stretching   HERNIA REPAIR N/A 2022   hiatal hernia    laporoscopy     LEFT HEART CATH AND CORONARY ANGIOGRAPHY N/A 06/02/2019   Procedure: LEFT HEART CATH AND CORONARY ANGIOGRAPHY;  Surgeon: Corky Crafts, MD;  Location: [facility];  Service: Cardiovascular;  Laterality: N/A;   TONSILLECTOMY     TOTAL HIP ARTHROPLASTY Right 2010   UPPER GASTROINTESTINAL ENDOSCOPY     wisdom teeth extracted     Patient Active Problem List   Diagnosis Date Noted   Vitamin B 12 deficiency 05/30/2021   Anemia 03/16/2020   Coronary artery disease involving native coronary artery of native heart without angina pectoris 06/12/2019   Diastolic dysfunction 05/16/2019   Rheumatoid arthritis (HCC) 01/08/2018   Hand pain, right 10/15/2017   Vitamin D deficiency 12/20/2016   Prediabetes 12/20/2016   Shortness of breath on exertion 11/27/2016   Morbid obesity (HCC)    Constipation 12/07/2015   BCC (basal cell carcinoma of skin) 10/05/2014   Rosacea 10/05/2014   Parkinson's disease (HCC) 05/11/2014   Screening for cervical cancer 07/17/2012   Foot pain, bilateral 06/19/2012   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   Depression with anxiety 11/02/2009   DYSPHAGIA  PHARYNGOESOPHAGEAL PHASE 11/25/2008   Lumbar back pain with radiculopathy affecting lower extremity 10/17/2007   Essential hypertension 07/05/2007   Osteoarthritis 07/05/2007   Hyperlipidemia 06/26/2007   H/O: iron deficiency anemia 05/08/2007   Hiatal hernia with GERD 05/08/2007    ONSET DATE: 02/15/23 (PD screen)  REFERRING DIAG: G20.A1 (ICD-10-CM) - Parkinson's disease without dyskinesia or fluctuating manifestations   THERAPY DIAG:  Unsteadiness on feet  Muscle weakness (generalized)  Other abnormalities of gait and mobility  Rationale for Evaluation and Treatment: Rehabilitation  SUBJECTIVE:                                                                                                                                                                                              SUBJECTIVE STATEMENT: Was hard to get down to the beach last week.  It was a good trip. Knee has been annoying.  Afraid that I'm going to fall when I first get out of the car.  Pt accompanied by: self  PERTINENT HISTORY: PD, anxiety, see additional PMH above  PAIN:  Are you having pain? Yes: NPRS scale: 2/10 Pain location: L knee Pain description: sore Aggravating factors: lifting leg to get into car , bone on bone arthritis Relieving factors: Aleve, cortisone shot; walking actually helps   PRECAUTIONS: Fall 03/19/2023-No squats, no lunges, until the pain feels better.  WEIGHT BEARING RESTRICTIONS: No  FALLS: Has patient fallen in last 6 months? No  LIVING ENVIRONMENT: Lives with: lives with their family Lives in: House/apartment Stairs: Yes: Internal: 12 steps; bilateral but cannot reach both Has following equipment at home:  single walking pole  PLOF: Independent with household mobility without device and Independent with community mobility with device  PATIENT GOALS: To get rid of fear of falling  OBJECTIVE:    TODAY'S TREATMENT: 05/29/2023 Activity Comments  Forward/back walking x 2 min   Sidestepping x 2 min   On Airex: -Feet apart/feet together EO/EC head turns/head nods, then EC head steady 30 sec  -Heel/toe raises 2 x 10 for ankle strategy -hip strategy A/P 2 x 10 -lateral weightshifting x 10 Worked on balance and limits of stability  Sit<>stand 5 reps , 2 sets  Followed by PWR! UP, then additional 3rd set with red band  Seated PWR! Up with resisted band, 2 x 5 reps   Short distance gait with attention to arm swing and posture      HOME EXERCISE PROGRAM: Access Code: [email or web address] URL: [email or web address] Date: 05/18/2023; 05/29/2023 Prepared by: [facility] - Outpatient  Rehab - [facility]  Exercises - Small Range Straight Leg Raise  - 1 x daily -  5 x weekly - 3 sets - 10 reps - 3 sec hold - Straight Leg Raise with External Rotation  - 1  x daily - 5 x weekly - 3 sets - 10 reps - 3 sec hold - Standing Hamstring Curl with Chair Support  - 1 x daily - 7 x weekly - 3 sets - 10 reps - Standing Hip Abduction with Ankle Weight  - 1 x daily - 4 x weekly - 3 sets - 10 reps - Standing Hip Extension with Ankle Weight  - 1 x daily - 4 x weekly - 3 sets - 10 reps - Standing Marching  - 1 x daily - 4 x weekly - 3 sets - 10 reps - Side Stepping with Counter Support  - 1 x daily - 4 x weekly - 3 sets - 10 reps - Hip Flexor/Ankle strengthening  - 1-2 x daily - 7 x weekly - 1 sets - 3-5 reps - 30-60 sec hold - Sit to Stand  - 1 x daily - 7 x weekly - 3 sets - 5 reps (could also perform PWR! UP upon standing with red band) - Standing Balance in Corner  - 1 x daily - 7 x weekly - 1 sets - 5 reps + seated PWR! Up with red theraband        PATIENT EDUCATION: (SELF-CARE) Education details: Talked about PD Symposium upcoming; reminded pt about follow up with Social worker about fall prevention/balance confidence program; added to HEP Person educated: Patient Education method: Programmer, multimedia, Facilities manager, and Handouts Education comprehension: verbalized understanding, returned demonstration, and needs further education       ------------------------------------------------------------ Objective measures below taken at initial evaluation:  DIAGNOSTIC FINDINGS: NA for this episode  COGNITION: Overall cognitive status: Within functional limits for tasks assessed   SENSATION: Light touch: WFL  MUSCLE TONE: RLE: Mild  POSTURE: rounded shoulders and forward head  LOWER EXTREMITY ROM:   WFL in sitting  Active  Right Eval Left Eval  Hip flexion  pain  Hip extension    Hip abduction    Hip adduction    Hip internal rotation    Hip external rotation    Knee flexion    Knee extension    Ankle dorsiflexion    Ankle plantarflexion    Ankle inversion    Ankle eversion     (Blank rows = not tested)  LOWER EXTREMITY MMT:  Grossly  tested in sitting  MMT Right Eval Left Eval  Hip flexion 4+ 4  Hip extension    Hip abduction    Hip adduction    Hip internal rotation    Hip external rotation    Knee flexion 4+ 4+  Knee extension 4+ 4+  Ankle dorsiflexion 4 4  Ankle plantarflexion    Ankle inversion    Ankle eversion    (Blank rows = not tested)  TRANSFERS: Assistive device utilized: None  Sit to stand: Modified independence Stand to sit: Modified independence  GAIT: Gait pattern: step through pattern, decreased arm swing- Right, decreased arm swing- Left, decreased stride length, and narrow BOS Distance walked: 100 ft Assistive device utilized:  single walking pole and None Level of assistance: Modified independence Comments: Guarded gait pattern  FUNCTIONAL TESTS:  5 times sit to stand: 12.31 sec Timed up and go (TUG): 14.34 sec 10 M:  12.1 sec = 2.7 ft/sec MiniBESTest: 22/28  TUG cognitive:14.56 sec  TUG manual:  14.62 sec  Full 360 turn:  8 steps  59M walk backwards:  16.10 sec = 0.62 ft/sec   M-CTSIB  Condition 1: Firm Surface, EO 30 Sec, Normal Sway  Condition 2: Firm Surface, EC 30 Sec, Mild Sway  Condition 3: Foam Surface, EO 30 Sec, Mild Sway  Condition 4: Foam Surface, EC 29.69 Sec, Moderate and Severe Sway    PATIENT SURVEYS:  ABC scale 78.75 (walking across parking lot is 50%, stepping off escalator is 60%  TODAY'S TREATMENT:                                                                                                                              DATE: 03/06/2023    PATIENT EDUCATION: Education details: Eval results, POC Person educated: Patient Education method: Explanation Education comprehension: verbalized understanding  HOME EXERCISE PROGRAM: Not yet initiated   GOALS: Goals reviewed with patient? Yes  SHORT TERM GOALS: Target date: 04/06/2023>UPDATED TARGET DATE 05/18/2023  Pt will be independent with HEP for improved balance, gait, strength. Baseline:new HEP  provided 04/25/2023 Goal status: IN PROGRESS  2.  Pt will improve 5x sit<>stand to less than or equal to 11 sec to demonstrate improved functional strength and transfer efficiency. Baseline: 12.31 sec; 1375 sec 04/25/2023>13.94 sec 05/18/2023 Goal status: NOT YET MET; IN PROGRESS  3.  Pt will improve 58M walk backwards to at least 1 ft/sec, without walking pole, for improved balance. Baseline: 0.62 ft/sec> 1.07 ft/sec with walking pole 04/25/2023 Goal status: MET  4.  Pt will improve MCTSIB Condition 4 to minimal sway for improved balance.  Baseline: mod sway 04/25/2023; severe sway 20 sec 05/18/2023 Goal status:NOT MET  LONG TERM GOALS: Target date: 04/20/2023>UPDATED TARGET DATE 06/08/2023  Pt will be Independent with HEP for improved balance, strength, gait. Baseline:  Goal status: IN PROGRESS  2.  Pt will improve MiniBESTest score to at least 24/28 to decrease fall risk. Baseline: 22/28 Goal status: IN PROGRESS  3.  Pt will improve ABC score to at least 85% for improved overall balance confidence  Baseline: 78.75% Goal status: IN PROGRESS  4.  Pt will report improved confidence in curb, outdoor surface negotiation by at least 50% for improved community mobility. Baseline:  Goal status: IN PROGRESS   ASSESSMENT:  CLINICAL IMPRESSION: Skilled PT session today focused on balance exercises, with focus on compliant surface balance work.  Really focused time on limits of stability so that pt can utilize hip/ankle strategy to help regain balance and know that postural sway is a part of being able to regain balance.  Also worked on resisted band with PWR! Up posture for improved postural awareness and pt loves this exercise.  Focused on educating pt on upright posture upon sit to stand for optimal balance.  She will continue to benefit from skilled PT to address strength and balance for improved functional mobility and decreased fall risk.  OBJECTIVE IMPAIRMENTS: Abnormal gait, decreased  balance, decreased mobility, difficulty walking, decreased strength, and postural dysfunction.   ACTIVITY  LIMITATIONS: standing, stairs, transfers, locomotion level, and caring for others  PARTICIPATION LIMITATIONS: shopping and community activity  PERSONAL FACTORS: 3+ comorbidities: see PMH above  are also affecting patient's functional outcome.   REHAB POTENTIAL: Good  CLINICAL DECISION MAKING: Evolving/moderate complexity  EVALUATION COMPLEXITY: Moderate  PLAN:  PT FREQUENCY: 2x/week  PT DURATION: 6 weeks plus eval week  PLANNED INTERVENTIONS: Therapeutic exercises, Therapeutic activity, Neuromuscular re-education, Balance training, Gait training, Patient/Family education, Self Care, and Taping  PLAN FOR NEXT SESSION: How did PWR! Up resisted ex go for posture?  Follow up about Kinesiotape instruction to pt/husband (pt to get knee brace).  Review HEP progression and continue balance work.  SLS, curb negotiation, unlevel surfaces   Lonia Blood, PT 05/29/23 1:18 PM Phone: [phone] Fax: [phone]   [facility] at [facility] [address], [address] [place], [place] [address] Phone # [phone] Fax # [phone]

## 2023-05-30 ENCOUNTER — Encounter: Payer: Self-pay | Admitting: Gastroenterology

## 2023-05-30 ENCOUNTER — Ambulatory Visit (INDEPENDENT_AMBULATORY_CARE_PROVIDER_SITE_OTHER): Payer: Medicare Other | Admitting: Gastroenterology

## 2023-05-30 VITALS — BP 112/74 | HR 78 | Ht 63.0 in | Wt 233.0 lb

## 2023-05-30 DIAGNOSIS — K3184 Gastroparesis: Secondary | ICD-10-CM | POA: Diagnosis not present

## 2023-05-30 DIAGNOSIS — K581 Irritable bowel syndrome with constipation: Secondary | ICD-10-CM

## 2023-05-30 MED ORDER — TRULANCE 3 MG PO TABS: 1.0000 | ORAL_TABLET | Freq: Every day | ORAL | 3 refills | Status: DC

## 2023-05-30 NOTE — Progress Notes (Signed)
Psychiatric Initial Adult Assessment   Patient Identification: Kristin [name] MRN:  [id number] Date of Evaluation:  06/05/2023 Referral Source: Shelva Majestic, MD  Chief Complaint:   Chief Complaint  Patient presents with   Establish Care   Visit Diagnosis:    ICD-10-CM   1. MDD (major depressive disorder), recurrent episode, mild (HCC)  F33.0     2. Anxiety disorder, unspecified type  F41.9     3. Insomnia, unspecified type  G47.00 Ambulatory referral to Neurology      History of Present Illness:   Kristin [name] is a 72 y.o. 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   Depression-  She thinks her mood has been better.  Although she has suffered from depression for 30 years, she has not felt down as much compared to before despite suffering from parkinson.  She does not want to stay at home, and wants to go out, although she does have concern of falling as described below. She loves being social and has some groups of friends. She has a few groups, who she has known after suffering from Parkinson.  Some group passed away, and another experiences more falls. This makes her feel more worried. She is afraid that "I'm gonna be the next." The patient has mood symptoms as in PHQ-9/GAD-7. She denies SI.   Insomnia-Although she believe she was initially referred due to insomnia, it has been improving.  She was started on entacapone, which has helped for tremors at night. She sleeps up to six hours, and takes a nap up to one hour. She snores at night. She has not been evaluated for sleep apnea before.   Sweating/anxiety-she states that sweating is in relation to fear of fall.  This is her biggest concern.  She fell twice a few years ago.  She is worried whenever she tries to go to someplace or when she is late for the appointment.   She occasionally has a vision of concrete coming up to her face.  She agrees that it is scary.  She states that she cannot be spontaneous as she needs to think and be prepared in advance to avoid any risk of fall. She does not feel anxious, and feels better after getting PT. She does not feel anxiety at all when she is in a pool.  While talking, she recognized that she has not done spinning, which she may restart as she feels safer.  She denies any random diaphoresis. She has not taken xanax, and feels comfortable staying off this medication.   Appetite-she reports difficulty in p.o. intake due to xerostomia and indigestion, which she attributes to gastroparesis.  She talks about an example of not being able to eat pasta last night due to indigestion, and some fear of choking.   PTSD-she denies any trauma.  However, she states that her sister, who is the therapist is working on childhood trauma with her own therapist. Kristin [name] states that her parents were never physically abusive. Her mother suffered from bipolar disorder, and her father was very strict.  She is assured that we can explore this only if she is open to it and willing during the upcoming visit.  Family-she reports good relationship with her husband.  He is a Product/process development scientist. He underwent stem cell transplant in 2017.  He has been doing better.   Medication- Duloxetine 30 mg  daily, lexapro 20 mg daily (for 15 years), she has not taken xanax  Household: son with autistic trait (employed) Marital status:married Number of children:3   Substance use  Tobacco Alcohol Other substances/  Current  Occasionally, a glass of wine denies  Past     Past Treatment          Wt Readings from Last 3 Encounters:  06/05/23 240 lb (108.9 kg)  06/04/23 239 lb 6.4 oz (108.6 kg)  05/30/23 233 lb (105.7 kg)     Associated Signs/Symptoms: Depression Symptoms:  depressed mood, insomnia, fatigue, anxiety, (Hypo) Manic Symptoms:   denies decreased need for  sleep, euphoria Anxiety Symptoms:  Excessive Worry, Psychotic Symptoms:   denies AH, VH, paranoia PTSD Symptoms: Negative  Past Psychiatric History:  Outpatient:  Psychiatry admission:  Previous suicide attempt: denies Past trials of medication:  History of violence:  History of head injury:   Previous Psychotropic Medications: Yes   Substance Abuse History in the last 12 months:  No.  Consequences of Substance Abuse: NA  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   Hyperlipidemia    Hypertension    Infertility, female    Low back pain 10/17/2007   Qualifier: Diagnosis of  By: Lovell Sheehan MD, [name]    Measles as a child   Obesity    Osteoarthritis    Parkinson disease (HCC)    Plantar fasciitis of left foot 06/19/2012   Preventative health care 12/19/2015   Rheumatoid arthritis (HCC) 01/08/2018   Rosacea 10/05/2014   Swallowing difficulty    Urinary frequency 02/25/2013   Visual floaters 05/11/2014    Past Surgical History:  Procedure Laterality Date   ABDOMINAL HYSTERECTOMY  2006   total   esophageal     stretching   HERNIA REPAIR N/A 2022   hiatal hernia   laporoscopy     LEFT HEART CATH AND CORONARY ANGIOGRAPHY N/A 06/02/2019   Procedure: LEFT HEART CATH AND CORONARY ANGIOGRAPHY;  Surgeon: Corky Crafts, MD;  Location: [facility];  Service: Cardiovascular;  Laterality:  N/A;   TONSILLECTOMY     TOTAL HIP ARTHROPLASTY Right 2010   UPPER GASTROINTESTINAL ENDOSCOPY     wisdom teeth extracted      Family Psychiatric History: as below  Family History:  Family History  Problem Relation Age of Onset   Other Mother        arrythmia   Mental illness Mother        bipolar   Hyperlipidemia Mother    Thyroid disease Mother    Depression Mother    Bipolar disorder Mother    Breast cancer Mother 28   Heart disease Father    Arthritis Father        rheumatoid   Hypertension Father  Hyperlipidemia Father    Depression Sister    Mental illness Sister        bipolar   Parkinson's disease Sister    Arthritis Sister    Arthritis Sister    Arthritis Sister    Breast cancer Maternal Aunt 35   Breast cancer Maternal Uncle        18s   Heart attack Paternal Uncle    Heart attack Paternal Grandfather    Breast cancer Cousin 65   Colon cancer Neg Hx    Esophageal cancer Neg Hx    Rectal cancer Neg Hx    Stomach cancer Neg Hx     Social History:   Social History   Socioeconomic History   Marital status: Married    Spouse name: Not on file   Number of children: 1   Years of education: Not on file   Highest education level: Master's degree (e.g., MA, MS, MEng, MEd, MSW, MBA)  Occupational History   Occupation: Retired International aid/development worker  Tobacco Use   Smoking status: Never   Smokeless tobacco: Never  Vaping Use   Vaping status: Never Used  Substance and Sexual Activity   Alcohol use: Yes    Comment: occasional wine   Drug use: No   Sexual activity: Not Currently    Partners: Male  Other Topics Concern   Not on file  Social History Narrative   Lives with husband, 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 3 grandsons- 2 in [place] and 78 year old in [place] in 2023      Hobbies: book club, time with friends, time with couples friends, plays pianos    Social Determinants of Health   Financial Resource Strain: Low Risk  (05/06/2023)   Overall Financial Resource Strain (CARDIA)    Difficulty of Paying Living Expenses: Not hard at all  Food Insecurity: No Food Insecurity (05/06/2023)   Hunger Vital Sign    Worried About Running Out of Food in the Last Year: Never true    Ran Out of Food in the Last Year: Never true  Transportation Needs: No Transportation Needs (05/06/2023)   PRAPARE - Administrator, Civil Service (Medical): No    Lack of Transportation (Non-Medical): No  Physical Activity: Insufficiently Active (05/06/2023)   Exercise Vital Sign    Days of Exercise per Week: 4 days    Minutes of Exercise per Session: 30 min  Stress: No Stress Concern Present (05/06/2023)   [employer] of Occupational Health - Occupational Stress Questionnaire    Feeling of Stress : Only a little  Recent Concern: Stress - Stress Concern Present (03/05/2023)   [employer] of Occupational Health - Occupational Stress Questionnaire    Feeling of Stress : To some extent  Social Connections: Socially Integrated (05/06/2023)   Social Connection and Isolation Panel [NHANES]    Frequency of Communication with Friends and Family: More than three times a week    Frequency of Social Gatherings with Friends and Family: Twice a week    Attends Religious Services: More than 4 times per year    Active Member of [employer] or Organizations: Yes    Attends Banker Meetings: 1 to 4 times per year    Marital Status: Married    Additional Social History: as above  Allergies:   Allergies  Allergen Reactions   Scopolamine Other (See Comments)  Panic hallu  Other Reaction(s): Hallucination, Not available   Prednisone Dermatitis    Facial ulcers  Other Reaction(s): facial swelling   Bactrim [Sulfamethoxazole-Trimethoprim] Other (See Comments)    Oral ulcers and rash   Penicillins Hives and Other (See Comments)    Did it involve  swelling of the face/tongue/throat, SOB, or low BP? No  Did it involve sudden or severe rash/hives, skin peeling, or any reaction on the inside of your mouth or nose? No  Did you need to seek medical attention at a hospital or doctor's office? No  When did it last happen?      30+ years ago  If all above answers are "NO", may proceed with cephalosporin use.   Sulfa Antibiotics     Other Reaction(s): Not available    Metabolic Disorder Labs: Lab Results  Component Value Date   HGBA1C 6.0 03/28/2023   MPG 126 (H) 12/29/2013   No results found for: "PROLACTIN" Lab Results  Component Value Date   CHOL 147 03/28/2023   TRIG 114.0 03/28/2023   HDL 63.80 03/28/2023   CHOLHDL 2 03/28/2023   VLDL 22.8 03/28/2023   LDLCALC 60 03/28/2023   LDLCALC 58 01/12/2022   Lab Results  Component Value Date   TSH 1.10 01/12/2022    Therapeutic Level Labs: No results found for: "LITHIUM" No results found for: "CBMZ" No results found for: "VALPROATE"  Current Medications: Current Outpatient Medications  Medication Sig Dispense Refill   ALPRAZolam (XANAX) 0.25 MG tablet TAKE 1 TABLET BY MOUTH 2 TIMES DAILY AS NEEDED FOR ANXIETY. (Patient not taking: Reported on 06/05/2023) 30 tablet 1   carbidopa-levodopa (SINEMET CR) 50-200 MG tablet TAKE 1 TABLET BY MOUTH EVERYDAY AT BEDTIME 90 tablet 1   carbidopa-levodopa (SINEMET IR) 25-100 MG tablet TAKE 2 TABLETS BY MOUTH AT 7AM, 2TABLETS AT 11AM, 1 TABLET AT 4PM 450 tablet 2   carvedilol (COREG) 3.125 MG tablet TAKE 1 TABLET BY MOUTH TWICE A DAY WITH MEALS 180 tablet 3   DULoxetine (CYMBALTA) 30 MG capsule TAKE 1 CAPSULE BY MOUTH EVERY DAY 90 capsule 1   entacapone (COMTAN) 200 MG tablet Take 1 tablet (200 mg total) by mouth 3 (three) times daily. 270 tablet 1   escitalopram (LEXAPRO) 20 MG tablet Take 1 tablet (20 mg total) by mouth daily. 90 tablet 3   folic acid (FOLVITE) 1 MG tablet Take 1 mg by mouth 2 (two) times daily.     losartan (COZAAR) 50  MG tablet TAKE 1 TABLET BY MOUTH EVERY DAY 90 tablet 3   omeprazole (PRILOSEC) 20 MG capsule TAKE 1 CAPSULE BY MOUTH 2 TIMES DAILY AS NEEDED. 180 capsule 1   Plecanatide (TRULANCE) 3 MG TABS Take 1 tablet (3 mg total) by mouth daily. 30 tablet 3   polyethylene glycol (MIRALAX / GLYCOLAX) 17 g packet Take 17 g by mouth daily as needed for mild constipation.     pramipexole (MIRAPEX) 0.5 MG tablet Take 1 tablet (0.5 mg total) by mouth 3 (three) times daily. 270 tablet 1   rosuvastatin (CRESTOR) 10 MG tablet TAKE 1 TABLET BY MOUTH ON MONDAYS,WEDNESDAYS, AND FRIDAYS 39 tablet 3   Vitamin D, Ergocalciferol, (DRISDOL) 1.25 MG (50000 UNIT) CAPS capsule TAKE 1 CAPSULE (50,000 UNITS TOTAL) BY MOUTH EVERY 7 (SEVEN) DAYS 13 capsule 1   No current facility-administered medications for this visit.    Musculoskeletal: Strength & Muscle Tone: within normal limits Gait & Station: normal Patient leans: N/A  Psychiatric Specialty Exam: Review  of Systems  Psychiatric/Behavioral:  Positive for dysphoric mood and sleep disturbance. Negative for agitation, behavioral problems, confusion, decreased concentration, hallucinations, self-injury and suicidal ideas. The patient is nervous/anxious. The patient is not hyperactive.   All other systems reviewed and are negative.   Blood pressure 126/82, pulse 75, temperature 97.6 F (36.4 C), temperature source Skin, height 5\' 3"  (1.6 m), weight 240 lb (108.9 kg).Body mass index is 42.51 kg/m.  General Appearance: Well Groomed  Eye Contact:  Good  Speech:  Clear and Coherent  Volume:  Normal  Mood:   better  Affect:  Appropriate, Congruent, and calm, reactive, smiles  Thought Process:  Coherent  Orientation:  Full (Time, Place, and Person)  Thought Content:  Logical  Suicidal Thoughts:  No  Homicidal Thoughts:  No  Memory:  Immediate;   Good  Judgement:  Good  Insight:  Good  Psychomotor Activity:  Normal  Concentration:  Concentration: Good and Attention  Span: Good  Recall:  Good  Fund of Knowledge:Good  Language: Good  Akathisia:  No  Handed:  Right  AIMS (if indicated):  not done  Assets:  Communication Skills Desire for Improvement  ADL's:  Intact  Cognition: WNL  Sleep:  Fair   Screenings: GAD-7    Flowsheet Row Office Visit from 05/09/2023 in [facility] from 03/28/2023 in [facility] from 01/23/2023 in [facility]  Total GAD-7 Score 5 6 0      Mini-Mental    Flowsheet Row Office Visit from 10/15/2017 in [facility]  Total Score (max 30 points ) 30      PHQ2-9    Flowsheet Row Office Visit from 05/09/2023 in [facility] Visit from 03/28/2023 in [facility] Clinical Support from 03/06/2023 in [facility] from 01/23/2023 in [facility] from 09/27/2022 in [facility]  PHQ-2 Total Score 0 4 0 0 0  PHQ-9 Total Score 9 15 0 0 0      Flowsheet Row ED from 03/22/2023 in [facility] Emergency Department at [facility] ED from 12/10/2020 in [facility] Emergency Department at [facility] ED from 10/06/2020 in [facility] Department at [facility]  C-SSRS RISK CATEGORY No Risk No Risk Error: Question 2 not populated       Assessment and Plan:  Kristin [name] is a 72 y.o. 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   1. MDD (major depressive disorder), recurrent episode, mild (HCC) 2. Anxiety disorder, unspecified type Acute stressors include: fear of falling secondary to Parkinson since 2015 Other stressors include:    History: suffers from depression for  many years, Originally on duloxetine 30 mg daily, lexapro 20 mg daily. Relapse in mood symptoms after trying monotherapy  Exam is notable for calm, euthymic affect, and she reports overall improvement in depressive symptoms as her insomnia improves.  Although she reports excessive sweating in relation to anxiety secondary to fear of fall, she has started to see a therapist, and it has been overall manageable.  Will continue current medication regimen.  Although it is preferable to avoid combination of SSRI and SNRI, she reports relapse in her mood symptoms when she tried to do consolidation.  Will consider consolidation  again when her mood is more stabilized.  Discussed potential risk of serotonin syndrome, QTc prolongation.   # gastroparesis She reports concerns about oral intake and a fear of choking due to xerostomia and gastroparesis. While we originally discussed considering a nutritionist, it may be more suitable for her to try speech therapy given her Parkinson's condition. I will send a message to Dr. Arbutus Leas regarding this.  3. Insomnia, unspecified type She is showing improvement in insomnia since the tremors at night have decreased after starting entacapone.  She reports snoring, and daytime fatigue, although this is partly attributable to Parkinson's disease.  She is willing to do sleep evaluation; will make referral.   Plan Continue duloxetine 30 mg daily  Continue lexapro 20 mg daily (EKG HR 70, QTc 430 msec, 03/2023) Referral for sleep evaluation She agrees that this provider communicates with Dr. Arbutus Leas, neurologist. Next appointment: 11/21 at 3:30  The patient demonstrates the following risk factors for suicide: Chronic risk factors for suicide include: psychiatric disorder of depression, anxiety . Acute risk factors for suicide include: loss (financial, interpersonal, professional). Protective factors for this patient include: positive social support, responsibility to others (children,  family), coping skills, and hope for the future. Considering these factors, the overall suicide risk at this point appears to be low. Patient is appropriate for outpatient follow up.   Collaboration of Care: Other reviewed notes in Epic, communicate with Dr. Arbutus Leas  Patient/Guardian was advised Release of Information must be obtained prior to any record release in order to collaborate their care with an outside provider. Patient/Guardian was advised if they have not already done so to contact the registration department to sign all necessary forms in order for [place] to release information regarding their care.   Consent: Patient/Guardian gives verbal consent for treatment and assignment of benefits for services provided during this visit. Patient/Guardian expressed understanding and agreed to proceed.   The duration of the time spent on the following activities on the date of the encounter was 60 minutes.   Preparing to see the patient (e.g., review of test, records)  Obtaining and/or reviewing separately obtained history  Performing a medically necessary exam and/or evaluation  Counseling and educating the patient/family/caregiver  Ordering medications, tests, or procedures  Referring and communicating with other healthcare professionals (when not reported separately)  Documenting clinical information in the electronic or paper health record  Independently interpreting results of tests/labs and communication of results to the family or caregiver  Care coordination (when not reported separately)   Neysa Hotter, MD 10/1/20243:50 PM

## 2023-05-30 NOTE — Patient Instructions (Signed)
We have sent the following medications to your pharmacy for you to pick up at your convenience: Trulance  Eat small frequent meals  Follow up in 6 months  _______________________________________________________  If your blood pressure at your visit was 140/90 or greater, please contact your primary care physician to follow up on this.  _______________________________________________________  If you are age 73 or older, your body mass index should be between 23-30. Your Body mass index is 41.27 kg/m. If this is out of the aforementioned range listed, please consider follow up with your Primary Care Provider.  If you are age 59 or younger, your body mass index should be between 19-25. Your Body mass index is 41.27 kg/m. If this is out of the aformentioned range listed, please consider follow up with your Primary Care Provider.   ________________________________________________________  The [facility] providers would like to encourage you to use [facility] to communicate with providers for non-urgent requests or questions.  Due to long hold times on the telephone, sending your provider a message by [facility] may be a faster and more efficient way to get a response.  Please allow 48 business hours for a response.  Please remember that this is for non-urgent requests.  _______________________________________________________   I appreciate the  opportunity to care for you  Thank You   Marsa Aris , MD

## 2023-05-31 DIAGNOSIS — M1712 Unilateral primary osteoarthritis, left knee: Secondary | ICD-10-CM | POA: Diagnosis not present

## 2023-06-01 ENCOUNTER — Encounter: Payer: Self-pay | Admitting: Physical Therapy

## 2023-06-01 ENCOUNTER — Ambulatory Visit: Payer: Medicare Other | Admitting: Physical Therapy

## 2023-06-01 DIAGNOSIS — R2689 Other abnormalities of gait and mobility: Secondary | ICD-10-CM | POA: Diagnosis not present

## 2023-06-01 DIAGNOSIS — R2681 Unsteadiness on feet: Secondary | ICD-10-CM | POA: Diagnosis not present

## 2023-06-01 DIAGNOSIS — R29818 Other symptoms and signs involving the nervous system: Secondary | ICD-10-CM | POA: Diagnosis not present

## 2023-06-01 DIAGNOSIS — M6281 Muscle weakness (generalized): Secondary | ICD-10-CM

## 2023-06-01 NOTE — Progress Notes (Signed)
Cardiology Office Note:  .    Date:  06/04/2023  ID:  EDWENA MAYORGA, DOB 1951-05-19, MRN [id number] PCP: Shelva Majestic, MD  [facility] Providers Cardiologist:  Thomasene Ripple, DO     History of Present Illness: Marland Kitchen    Kristin [name] is a 72 y.o. female with a hx of CAD, hypertension, hyperglycemia, hyperlipidemia, obesity, and Parkinson's, here for follow-up. She was initially seen 12/07/2021 for the evaluation of CAD at the request of Dr Abner Greenspan. She saw her PCP 09/2021 and reported atypical chest pain. 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 She was referred to cardiology for further evaluation. She had a negative nuclear stress test 07/2021. She had left heart cath 05/2019 which had 10% RCA lesion   At her visit 12/2021, she reported intermittent chest pain occurring at random times and was able to calm herself with taking deep breaths. She was very active with PT, swimming, spin class, and lifting weights, without anginal symptoms or shortness of breath. Heart rates were in the 80's during exercise. Home blood pressures typically ranged 105-120/60-70.   She had presented to the ED 03/22/2023 with complaints of lightheadedness and elevated blood pressure. She had orthostasis with lightheadedness the day prior at rehab. Prior to arriving at the ED, her blood pressure was 168/92 at 8 AM, and then 174/98 at 10:30 AM on her antihypertensives. While in the ER her BP improved to 128/66 without intervention. It was noted that her blood pressure may be more labile due to her Parkinson's treatments. EKG without ischemia, and labs were reassuring. Low suspicion for stroke.   Today, she appears well from a cardiovascular standpoint. Her home blood pressures have been stable and controlled. She complains of a little bit of swelling in the ankles which she notices sometimes. It does improve by the next morning and with elevation. She denies orthopnea. One of her main concerns is that she doesn't walk  well. She is working with PT for balance and strength. Every day she is exercising in some capacity. She focuses on strength exercises twice weekly and has been swimming and participating in chair yoga. She is physically limited by knee pain, but she denies any cardiovascular exertional symptoms. No falls. She has been evaluated by neurology and is also scheduled to see a psychiatrist tomorrow. Additionally she continues to adjust to her diagnosis of gastroparesis. She denies any palpitations, chest pain, shortness of breath, lightheadedness, headaches, syncope, or PND.  ROS:  Please see the history of present illness. All other systems are reviewed and negative.  (+) Knee pain (+) Intermittent ankle swelling bilaterally  Studies Reviewed: .        Risk Assessment/Calculations:             Physical Exam:    VS:  BP 124/78   Pulse 81   Ht 5\' 3"  (1.6 m)   Wt 239 lb 6.4 oz (108.6 kg)   SpO2 98%   BMI 42.41 kg/m  , BMI Body mass index is 42.41 kg/m. GENERAL:  Well appearing HEENT: Pupils equal round and reactive, fundi not visualized, oral mucosa unremarkable NECK:  No jugular venous distention, waveform within normal limits, carotid upstroke brisk and symmetric, no bruits, no thyromegaly LUNGS:  Clear to auscultation bilaterally HEART:  RRR.  PMI not displaced or sustained,S1 and S2 within normal limits, no S3, no S4, no clicks, no rubs, no murmurs ABD:  Flat, positive bowel sounds normal in frequency in pitch, no bruits, no  rebound, no guarding, no midline pulsatile mass, no hepatomegaly, no splenomegaly EXT:  2 plus pulses throughout, no edema, no cyanosis no clubbing SKIN:  No rashes no nodules NEURO:  Cranial nerves II through XII grossly intact, motor grossly intact throughout PSYCH:  Cognitively intact, oriented to person place and time  Wt Readings from Last 3 Encounters:  06/04/23 239 lb 6.4 oz (108.6 kg)  05/30/23 233 lb (105.7 kg)  05/09/23 239 lb 9.6 oz (108.7 kg)      ASSESSMENT AND PLAN: .    # Coronary Artery Disease # Hyperlipidemia: Minimal plaque detected in native artery. LDL controlled under 70 with statin therapy. Patient also on low dose Carvedilol and Losartan for blood pressure control and heart failure risk reduction. -Continue current medication regimen.   # Diastolic dysfunction: # Edema (Ankles) # Hypertension: Mild swelling reported in ankles, more noticeable in the evening. No associated shortness of breath reported.  None present at this time.  Otherwise no heart failure symtpoms.  She has grade 1 diastolic dysfunction on echo.  BP well-controlled on carvedilol and losartan. -Monitor for progression of swelling and report any changes.   # Parkinson's Disease Patient reports difficulty with balance and walking. Currently engaged in physical therapy, strength exercises, swimming, and Parkinson's specific exercise classes. No falls reported. Patient to see a psychiatrist for potential medication management. -Continue current exercise regimen and physical therapy.  # Gastroparesis Patient reports difficulty managing gastroparesis. No current symptoms of reflux reported. -Continue current management strategies. - GLP-1 could exacerbate symptoms    General Health Maintenance / Followup Plans -Continue current exercise regimen and physical therapy. -Continue current medication regimen. -Follow up as needed with any new symptoms such as chest pain, increased swelling, or increased shortness of breath.       Dispo:  FU with Reonna Finlayson C. Duke Salvia, MD, [facility] as needed.  I,Mathew Stumpf,acting as a Neurosurgeon for Chilton Si, MD.,have documented all relevant documentation on the behalf of Chilton Si, MD,as directed by  Chilton Si, MD while in the presence of Chilton Si, MD.  I, Lamount Bankson C. Duke Salvia, MD have reviewed all documentation for this visit.  The documentation of the exam, diagnosis, procedures, and orders on  06/04/2023 are all accurate and complete.   Signed, Chilton Si, MD
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
## 2023-06-04 ENCOUNTER — Ambulatory Visit (INDEPENDENT_AMBULATORY_CARE_PROVIDER_SITE_OTHER): Payer: Medicare Other | Admitting: Cardiovascular Disease

## 2023-06-04 ENCOUNTER — Encounter (HOSPITAL_BASED_OUTPATIENT_CLINIC_OR_DEPARTMENT_OTHER): Payer: Self-pay | Admitting: Cardiovascular Disease

## 2023-06-04 ENCOUNTER — Encounter: Payer: Self-pay | Admitting: Physical Therapy

## 2023-06-04 ENCOUNTER — Encounter: Payer: Self-pay | Admitting: Gastroenterology

## 2023-06-04 ENCOUNTER — Ambulatory Visit: Payer: Medicare Other | Admitting: Physical Therapy

## 2023-06-04 VITALS — BP 124/78 | HR 81 | Ht 63.0 in | Wt 239.4 lb

## 2023-06-04 DIAGNOSIS — I2510 Atherosclerotic heart disease of native coronary artery without angina pectoris: Secondary | ICD-10-CM | POA: Diagnosis not present

## 2023-06-04 DIAGNOSIS — I10 Essential (primary) hypertension: Secondary | ICD-10-CM

## 2023-06-04 DIAGNOSIS — R2689 Other abnormalities of gait and mobility: Secondary | ICD-10-CM

## 2023-06-04 DIAGNOSIS — R29818 Other symptoms and signs involving the nervous system: Secondary | ICD-10-CM | POA: Diagnosis not present

## 2023-06-04 DIAGNOSIS — M6281 Muscle weakness (generalized): Secondary | ICD-10-CM | POA: Diagnosis not present

## 2023-06-04 DIAGNOSIS — R2681 Unsteadiness on feet: Secondary | ICD-10-CM | POA: Diagnosis not present
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
## 2023-06-04 NOTE — Patient Instructions (Signed)
Medication Instructions:  ?Your physician recommends that you continue on your current medications as directed. Please refer to the Current Medication list given to you today.  ? ?Labwork: ?NONE ? ?Testing/Procedures: ?NONE ? ?Follow-Up: ?AS NEEDED  ? ?  ?

## 2023-06-05 ENCOUNTER — Ambulatory Visit (INDEPENDENT_AMBULATORY_CARE_PROVIDER_SITE_OTHER): Payer: Medicare Other | Admitting: Psychiatry

## 2023-06-05 ENCOUNTER — Encounter: Payer: Self-pay | Admitting: Psychiatry

## 2023-06-05 VITALS — BP 126/82 | HR 75 | Temp 97.6°F | Ht 63.0 in | Wt 240.0 lb

## 2023-06-05 DIAGNOSIS — F419 Anxiety disorder, unspecified: Secondary | ICD-10-CM

## 2023-06-05 DIAGNOSIS — G4700 Insomnia, unspecified: Secondary | ICD-10-CM | POA: Diagnosis not present

## 2023-06-05 DIAGNOSIS — F330 Major depressive disorder, recurrent, mild: Secondary | ICD-10-CM

## 2023-06-06 ENCOUNTER — Ambulatory Visit: Payer: Medicare Other | Attending: Neurology | Admitting: Physical Therapy

## 2023-06-06 ENCOUNTER — Encounter: Payer: Self-pay | Admitting: Physical Therapy

## 2023-06-06 DIAGNOSIS — R2689 Other abnormalities of gait and mobility: Secondary | ICD-10-CM | POA: Diagnosis present

## 2023-06-06 DIAGNOSIS — M6281 Muscle weakness (generalized): Secondary | ICD-10-CM | POA: Insufficient documentation

## 2023-06-06 DIAGNOSIS — R2681 Unsteadiness on feet: Secondary | ICD-10-CM | POA: Insufficient documentation

## 2023-06-06 NOTE — Therapy (Signed)
OUTPATIENT PHYSICAL THERAPY NEURO TREATMENT/DISCHARGE SUMMARY   Patient Name: Kristin [name] MRN: [id number] DOB:[birth year], 72 y.o., female Today's Date: 06/07/2023   PCP: Shelva Majestic, MD REFERRING PROVIDER: Tat, Octaviano Batty, DO   PHYSICAL THERAPY DISCHARGE SUMMARY  Visits from Start of Care: 14  Current functional level related to goals / functional outcomes: See below-Pt has met 3 of 4 LTGs   Remaining deficits: Pt has demonstrated improvement in MiniBESTest, improved balance.  She reports improved balance confidence on outdoor surfaces in the community; however, she does continue to report hesitancy at times with balance.   Education / Equipment: HEP   Patient agrees to discharge. Patient goals were partially met. Patient is being discharged due to being pleased with the current functional level.    END OF SESSION:  PT End of Session - 06/06/23 1450     Visit Number 14    Number of Visits 16    Date for PT Re-Evaluation 06/08/23    Authorization Type Medicare/BCBS-[employer]X    Progress Note Due on Visit --   completed at visit 9   PT Start Time 1448    PT Stop Time 1530    PT Time Calculation (min) 42 min    Activity Tolerance Patient tolerated treatment well    Behavior During Therapy [facility] for tasks assessed/performed                        Past Medical History:  Diagnosis Date   Abnormal vaginal Pap smear    Acute pharyngitis 02/25/2013   Anemia    Anxiety    Arthritis    BCC (basal cell carcinoma of skin) 10/05/2014   On back   Chicken pox as a child   Chronic UTI    sees dr Vonita Moss   Constipation 12/07/2015   Depression with anxiety 11/02/2009   Qualifier: Diagnosis of  By: Mayford Knife, LPN, Bonnye M    Dermatitis 07/17/2012   Esophageal stricture 1994   Fibroids    Foot pain, bilateral 06/19/2012   GERD (gastroesophageal reflux disease)    Hiatal hernia    Hyperglycemia 08/19/2013   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   Hyperlipidemia     Hypertension    Infertility, female    Low back pain 10/17/2007   Qualifier: Diagnosis of  By: Lovell Sheehan MD, [name]    Measles as a child   Obesity    Osteoarthritis    Parkinson disease (HCC)    Plantar fasciitis of left foot 06/19/2012   Preventative health care 12/19/2015   Rheumatoid arthritis (HCC) 01/08/2018   Rosacea 10/05/2014   Swallowing difficulty    Urinary frequency 02/25/2013   Visual floaters 05/11/2014   Past Surgical History:  Procedure Laterality Date   ABDOMINAL HYSTERECTOMY  2006   total   esophageal     stretching   HERNIA REPAIR N/A 2022   hiatal hernia   laporoscopy     LEFT HEART CATH AND CORONARY ANGIOGRAPHY N/A 06/02/2019   Procedure: LEFT HEART CATH AND CORONARY ANGIOGRAPHY;  Surgeon: Corky Crafts, MD;  Location: [facility];  Service: Cardiovascular;  Laterality: N/A;   TONSILLECTOMY     TOTAL HIP ARTHROPLASTY Right 2010   UPPER GASTROINTESTINAL ENDOSCOPY     wisdom teeth extracted     Patient Active Problem List   Diagnosis Date Noted   Vitamin B 12 deficiency 05/30/2021   Anemia 03/16/2020   Coronary artery disease involving native coronary artery  of native heart without angina pectoris 06/12/2019   Diastolic dysfunction 05/16/2019   Rheumatoid arthritis (HCC) 01/08/2018   Hand pain, right 10/15/2017   Vitamin D deficiency 12/20/2016   Prediabetes 12/20/2016   Shortness of breath on exertion 11/27/2016   Morbid obesity (HCC)    Constipation 12/07/2015   BCC (basal cell carcinoma of skin) 10/05/2014   Rosacea 10/05/2014   Parkinson's disease (HCC) 05/11/2014   Screening for cervical cancer 07/17/2012   Foot pain, bilateral 06/19/2012   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   Depression with anxiety 11/02/2009   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   Lumbar back pain with radiculopathy affecting lower extremity 10/17/2007   Essential hypertension 07/05/2007   Osteoarthritis 07/05/2007   Hyperlipidemia 06/26/2007   H/O:  iron deficiency anemia 05/08/2007   Hiatal hernia with GERD 05/08/2007    ONSET DATE: 02/15/23 (PD screen)  REFERRING DIAG: G20.A1 (ICD-10-CM) - Parkinson's disease without dyskinesia or fluctuating manifestations   THERAPY DIAG:  Unsteadiness on feet  Other abnormalities of gait and mobility  Muscle weakness (generalized)  Rationale for Evaluation and Treatment: Rehabilitation  SUBJECTIVE:                                                                                                                                                                                             SUBJECTIVE STATEMENT: Get nervous sometimes with gait.  Have a few questions about exercises.    Pt accompanied by: self  PERTINENT HISTORY: PD, anxiety, see additional PMH above  PAIN:  Are you having pain? Yes: NPRS scale: 0-4/10 Pain location: L knee Pain description: sore Aggravating factors: lifting leg to get into car , bone on bone arthritis Relieving factors: Aleve, cortisone shot; walking actually helps   PRECAUTIONS: Fall 03/19/2023-No squats, no lunges, until the pain feels better.  WEIGHT BEARING RESTRICTIONS: No  FALLS: Has patient fallen in last 6 months? No  LIVING ENVIRONMENT: Lives with: lives with their family Lives in: House/apartment Stairs: Yes: Internal: 12 steps; bilateral but cannot reach both Has following equipment at home:  single walking pole  PLOF: Independent with household mobility without device and Independent with community mobility with device  PATIENT GOALS: To get rid of fear of falling  OBJECTIVE:    TODAY'S TREATMENT: 06/06/2023 Activity Comments  MiniBESTest :  24/28 Improved from 22/28  Reviewed HEP of recent balance additions  Good return demo  TUG:  14.19 sec   TUG cognitive:15.72 sec   Gait velocity:  13.03 sec = 2.52 ft/sec No walking pole  ABC scale:  53.75       [facility] PT Assessment - 06/07/23 1547  Mini-BESTest   Sit To Stand  Normal: Comes to stand without use of hands and stabilizes independently.    Rise to Toes Moderate: Heels up, but not full range (smaller than when holding hands), OR noticeable instability for 3 s.    Stand on one leg (left) Moderate: < 20 s   10.63, 3.03   Stand on one leg (right) Moderate: < 20 s   3.03, 2.96 sec   Stand on one leg - lowest score 1    Compensatory Stepping Correction - Forward Normal: Recovers independently with a single, large step (second realignement is allowed).    Compensatory Stepping Correction - Backward Normal: Recovers independently with a single, large step    Compensatory Stepping Correction - Left Lateral Normal: Recovers independently with 1 step (crossover or lateral OK)    Compensatory Stepping Correction - Right Lateral Normal: Recovers independently with 1 step (crossover or lateral OK)    Stepping Corredtion Lateral - lowest score 2    Stance - Feet together, eyes open, firm surface  Normal: 30s    Stance - Feet together, eyes closed, foam surface  Normal: 30s   30.75 sec   Incline - Eyes Closed Normal: Stands independently 30s and aligns with gravity    Change in Gait Speed Normal: Significantly changes walkling speed without imbalance    Walk with head turns - Horizontal Normal: performs head turns with no change in gait speed and good balance    Walk with pivot turns Normal: Turns with feet close FAST (< 3 steps) with good balance.    Step over obstacles Moderate: Steps over box but touches box OR displays cautious behavior by slowing gait.    Timed UP & GO with Dual Task Moderate: Dual Task affects either counting OR walking (>10%) when compared to the TUG without Dual Task.    Mini-BEST total score 24               Access Code: [email or web address] URL: [email or web address] Date: 06/04/2023 Prepared by: [name] - Outpatient  Rehab - [facility]  Program Notes Wall bump exercise:  Stand about 6 inches in front of a wall.  Bend at  your hips to touch your hips to the wall, then use your hips to pull forward and tall.  Repeat, 2 x 10 reps  Exercises - Small Range Straight Leg Raise  - 1 x daily - 5 x weekly - 3 sets - 10 reps - 3 sec hold - Straight Leg Raise with External Rotation  - 1 x daily - 5 x weekly - 3 sets - 10 reps - 3 sec hold - Standing Hamstring Curl with Chair Support  - 1 x daily - 7 x weekly - 3 sets - 10 reps - Standing Hip Abduction with Ankle Weight  - 1 x daily - 4 x weekly - 3 sets - 10 reps - Standing Hip Extension with Ankle Weight  - 1 x daily - 4 x weekly - 3 sets - 10 reps - Standing Marching  - 1 x daily - 4 x weekly - 3 sets - 10 reps - Side Stepping with Counter Support  - 1 x daily - 4 x weekly - 3 sets - 10 reps - Hip Flexor/Ankle strengthening  - 1-2 x daily - 7 x weekly - 1 sets - 3-5 reps - 30-60 sec hold - Sit to Stand  - 1 x daily - 7 x weekly - 3 sets - 5 reps -  Standing Balance in Corner  - 1 x daily - 7 x weekly - 1 sets - 5 reps - Heel Toe Raises with Counter Support  - 1 x daily - 7 x weekly - 2 sets - 10 reps - Forward and Backward Stepping  - 1 x daily - 5 x weekly - 2 sets - 10 reps  + seated PWR! Up with red theraband        PATIENT EDUCATION:  Education details: Review of HEP, POC, plan for d/c this visit, return eval Person educated: Patient Education method: Explanation, Demonstration, and Handouts Education comprehension: verbalized understanding, returned demonstration, and needs further education       ------------------------------------------------------------ Objective measures below taken at initial evaluation:  DIAGNOSTIC FINDINGS: NA for this episode  COGNITION: Overall cognitive status: Within functional limits for tasks assessed   SENSATION: Light touch: WFL  MUSCLE TONE: RLE: Mild  POSTURE: rounded shoulders and forward head  LOWER EXTREMITY ROM:   WFL in sitting  Active  Right Eval Left Eval  Hip flexion  pain  Hip extension     Hip abduction    Hip adduction    Hip internal rotation    Hip external rotation    Knee flexion    Knee extension    Ankle dorsiflexion    Ankle plantarflexion    Ankle inversion    Ankle eversion     (Blank rows = not tested)  LOWER EXTREMITY MMT:  Grossly tested in sitting  MMT Right Eval Left Eval  Hip flexion 4+ 4  Hip extension    Hip abduction    Hip adduction    Hip internal rotation    Hip external rotation    Knee flexion 4+ 4+  Knee extension 4+ 4+  Ankle dorsiflexion 4 4  Ankle plantarflexion    Ankle inversion    Ankle eversion    (Blank rows = not tested)  TRANSFERS: Assistive device utilized: None  Sit to stand: Modified independence Stand to sit: Modified independence  GAIT: Gait pattern: step through pattern, decreased arm swing- Right, decreased arm swing- Left, decreased stride length, and narrow BOS Distance walked: 100 ft Assistive device utilized:  single walking pole and None Level of assistance: Modified independence Comments: Guarded gait pattern  FUNCTIONAL TESTS:  5 times sit to stand: 12.31 sec Timed up and go (TUG): 14.34 sec 10 M:  12.1 sec = 2.7 ft/sec MiniBESTest: 22/28  TUG cognitive:14.56 sec  TUG manual:  14.62 sec  Full 360 turn:  8 steps  30M walk backwards:  16.10 sec = 0.62 ft/sec   M-CTSIB  Condition 1: Firm Surface, EO 30 Sec, Normal Sway  Condition 2: Firm Surface, EC 30 Sec, Mild Sway  Condition 3: Foam Surface, EO 30 Sec, Mild Sway  Condition 4: Foam Surface, EC 29.69 Sec, Moderate and Severe Sway    PATIENT SURVEYS:  ABC scale 78.75 (walking across parking lot is 50%, stepping off escalator is 60%  TODAY'S TREATMENT:  DATE: 03/06/2023    PATIENT EDUCATION: Education details: Eval results, POC Person educated: Patient Education method: Explanation Education comprehension: verbalized  understanding  HOME EXERCISE PROGRAM: Not yet initiated   GOALS: Goals reviewed with patient? Yes  SHORT TERM GOALS: Target date: 04/06/2023>UPDATED TARGET DATE 05/18/2023  Pt will be independent with HEP for improved balance, gait, strength. Baseline:new HEP provided 04/25/2023 Goal status: IN PROGRESS  2.  Pt will improve 5x sit<>stand to less than or equal to 11 sec to demonstrate improved functional strength and transfer efficiency. Baseline: 12.31 sec; 1375 sec 04/25/2023>13.94 sec 05/18/2023 Goal status: NOT YET MET; IN PROGRESS  3.  Pt will improve 34M walk backwards to at least 1 ft/sec, without walking pole, for improved balance. Baseline: 0.62 ft/sec> 1.07 ft/sec with walking pole 04/25/2023 Goal status: MET  4.  Pt will improve MCTSIB Condition 4 to minimal sway for improved balance.  Baseline: mod sway 04/25/2023; severe sway 20 sec 05/18/2023 Goal status:NOT MET  LONG TERM GOALS: Target date: 04/20/2023>UPDATED TARGET DATE 06/08/2023  Pt will be Independent with HEP for improved balance, strength, gait. Baseline:  Goal status: Met 06/06/2023  2.  Pt will improve MiniBESTest score to at least 24/28 to decrease fall risk. Baseline: 22/28>24/28 06/06/2023 Goal status: MET 06/06/2023  3.  Pt will improve ABC score to at least 85% for improved overall balance confidence  Baseline: 78.75% Goal status: NOT MET 06/06/2023  4.  Pt will report improved confidence in curb, outdoor surface negotiation by at least 50% for improved community mobility. Baseline: Met, per report/demo 06/04/2023 Goal status: MET   ASSESSMENT:  CLINICAL IMPRESSION: Pt has met 3 of 4 LTGs.  She hs met LTG 1, 2, 4 for independent HEP, improved MiniBESTest, and improved confidence in negotiating outdoor surfaces.  She does still report decreased overall balance confidence per ABC score, but she feels good about progress made in therapy.  She has been given strength and balance related exercises and PT has made  recommendations for community fitness/walking program.  She is appropriate for d/c at this time.     OBJECTIVE IMPAIRMENTS: Abnormal gait, decreased balance, decreased mobility, difficulty walking, decreased strength, and postural dysfunction.   ACTIVITY LIMITATIONS: standing, stairs, transfers, locomotion level, and caring for others  PARTICIPATION LIMITATIONS: shopping and community activity  PERSONAL FACTORS: 3+ comorbidities: see PMH above  are also affecting patient's functional outcome.   REHAB POTENTIAL: Good  CLINICAL DECISION MAKING: Evolving/moderate complexity  EVALUATION COMPLEXITY: Moderate  PLAN:  PT FREQUENCY: 2x/week  PT DURATION: 6 weeks plus eval week  PLANNED INTERVENTIONS: Therapeutic exercises, Therapeutic activity, Neuromuscular re-education, Balance training, Gait training, Patient/Family education, Self Care, and Taping  PLAN FOR NEXT SESSION:  discharge PT and recommend PT return eval in 6 months.  She is agreeable to OT/speech screens in 6-9 months.   Lonia Blood, PT 06/07/23 3:47 PM Phone: [phone] Fax: [phone]   [facility] at [facility] [address] [place], [address] [place], [place] [address] Phone # [phone] Fax # [phone]

## 2023-06-07 ENCOUNTER — Other Ambulatory Visit: Payer: Self-pay

## 2023-06-07 ENCOUNTER — Telehealth: Payer: Self-pay | Admitting: Neurology

## 2023-06-07 DIAGNOSIS — M1712 Unilateral primary osteoarthritis, left knee: Secondary | ICD-10-CM | POA: Diagnosis not present

## 2023-06-07 DIAGNOSIS — R1310 Dysphagia, unspecified: Secondary | ICD-10-CM

## 2023-06-07 NOTE — Telephone Encounter (Signed)
Patient saw Dr. Vanetta Shawl, who reached out to me after the visit.  They opted not to trial any medication for anxiety at this time, as patient was doing a bit better.  She is seeing a therapist/counselor now.  Patient also felt that nighttime tremor was better on entacapone.  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  This was evaluated a few years ago and the dysphagia was felt to be GI related, and she did just see GI September, 2024.  Patient and Dr. Vanetta Shawl talked about a referral to speech therapy, and Dr. Vanetta Shawl asked if I could refer her.  I certainly have no objection to that.  Chelsea, please refer the patient with a diagnosis of dysphagia.  Speech therapy may want an updated MBE, but we will see.  She goes to [facility]

## 2023-06-11 ENCOUNTER — Ambulatory Visit: Payer: Medicare Other

## 2023-06-11 DIAGNOSIS — N3941 Urge incontinence: Secondary | ICD-10-CM | POA: Diagnosis not present

## 2023-06-11 DIAGNOSIS — M6289 Other specified disorders of muscle: Secondary | ICD-10-CM | POA: Diagnosis not present

## 2023-06-11 DIAGNOSIS — M6281 Muscle weakness (generalized): Secondary | ICD-10-CM | POA: Diagnosis not present

## 2023-06-11 DIAGNOSIS — M62838 Other muscle spasm: Secondary | ICD-10-CM | POA: Diagnosis not present

## 2023-06-11 DIAGNOSIS — K5900 Constipation, unspecified: Secondary | ICD-10-CM | POA: Diagnosis not present

## 2023-06-14 DIAGNOSIS — M1712 Unilateral primary osteoarthritis, left knee: Secondary | ICD-10-CM | POA: Diagnosis not present

## 2023-06-18 NOTE — Telephone Encounter (Signed)
Referral sent on 06/07/2023 for speech therapy.

## 2023-06-18 NOTE — Telephone Encounter (Signed)
Has this been addressed?  If so, please document and close the encounter.  thanks

## 2023-07-04 ENCOUNTER — Ambulatory Visit (INDEPENDENT_AMBULATORY_CARE_PROVIDER_SITE_OTHER): Payer: Medicare Other | Admitting: Neurology

## 2023-07-04 ENCOUNTER — Encounter: Payer: Self-pay | Admitting: Neurology

## 2023-07-04 VITALS — BP 124/69 | HR 79 | Ht 63.0 in | Wt 242.2 lb

## 2023-07-04 DIAGNOSIS — G4700 Insomnia, unspecified: Secondary | ICD-10-CM

## 2023-07-04 DIAGNOSIS — G4750 Parasomnia, unspecified: Secondary | ICD-10-CM

## 2023-07-04 DIAGNOSIS — R351 Nocturia: Secondary | ICD-10-CM

## 2023-07-04 DIAGNOSIS — G4719 Other hypersomnia: Secondary | ICD-10-CM | POA: Diagnosis not present

## 2023-07-04 DIAGNOSIS — Z9189 Other specified personal risk factors, not elsewhere classified: Secondary | ICD-10-CM

## 2023-07-04 NOTE — Progress Notes (Signed)
Subjective:    Patient ID: Kristin [name] is a 72 y.o. female.  HPI    Huston Foley, MD, PhD [facility] [address], [address] [address] [place], [place] [address]  Dear Dr. Vanetta Shawl,  I saw your patient, Kristin [name], upon your kind request in my sleep clinic today for initial consultation of her sleep disorder, in particular, concern for an underlying obstructive sleep apnea. The patient is unaccompanied today.  As you know, Ms. Kristin [name] is a 72 year old female with an underlying complex medical history of anemia, arthritis, basal cell cancer, depression, anxiety, reflux disease, hypertension, hyperlipidemia, low back pain, rosacea, parkinsonism (followed by [facility] neurology), and severe obesity with a BMI of over 40, who reports chronic difficulty sleeping, sleep disruption, nonrestorative sleep, daytime tiredness and sleepiness.  Her Epworth sleepiness score is 14 out of 24, fatigue severity score is 33 out of 63.  She has a variable sleep schedule, she tries to be in bed around 11 but may not fall asleep until later.  She wakes up in the early morning hours and cannot go back to sleep.  Sometimes he goes to the family room and sleeps in the recliner, may sleep for another few hours.  She has a variable rise time.  She lives with her husband and adult son who stays in the upstairs floor of their home.  She has nocturia about once per average night, she has had sleep issues for years, several years ago she had 2 incidents of dream enactment behavior.  1 time she fell out of bed.  She occasionally makes moaning sounds in her sleep and mumbles in her sleep per husband's feedback, she is not sure that she snores.  Her husband snores, at times loudly which is disturbing to her.  She has not tried melatonin.  She is currently in physical therapy for pelvic floor exercises.  She was recently diagnosed with gastroparesis.  She has a history of hiatal hernia and had surgery for this  at [facility].  She denies recurrent nocturnal or morning headaches.  She rinks caffeine in limitation, 1 cup of coffee in the morning.  She drinks alcohol occasionally, about once a week.  She is a non-smoker.  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   I reviewed your office note from 06/05/2023.  Her Past Medical History Is Significant For: Past Medical History:  Diagnosis Date   Abnormal vaginal Pap smear    Acute pharyngitis 02/25/2013   Anemia    Anxiety    Arthritis    BCC (basal cell carcinoma of skin) 10/05/2014   On back   Chicken pox as a child   Chronic UTI    sees dr Vonita Moss   Constipation 12/07/2015   Depression with anxiety 11/02/2009   Qualifier: Diagnosis of  By: Mayford Knife, LPN, Bonnye M    Dermatitis 07/17/2012   Esophageal stricture 1994   Fibroids    Foot pain, bilateral 06/19/2012   GERD (gastroesophageal reflux disease)    Hiatal hernia    Hyperglycemia 08/19/2013   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   Hyperlipidemia    Hypertension    Infertility, female    Low back pain 10/17/2007   Qualifier: Diagnosis of  By: Lovell Sheehan MD, [name]    Measles as a child   Obesity    Osteoarthritis    Parkinson disease (HCC)    Plantar fasciitis of left  foot 06/19/2012   Preventative health care 12/19/2015   Rheumatoid arthritis (HCC) 01/08/2018   Rosacea 10/05/2014   Swallowing difficulty    Urinary frequency 02/25/2013   Visual floaters 05/11/2014    Her Past Surgical History Is Significant For: Past Surgical History:  Procedure Laterality Date   ABDOMINAL HYSTERECTOMY  2006   total   esophageal     stretching   HERNIA REPAIR N/A 2022   hiatal hernia   laporoscopy     LEFT HEART CATH AND CORONARY ANGIOGRAPHY N/A 06/02/2019   Procedure: LEFT HEART CATH AND CORONARY ANGIOGRAPHY;  Surgeon: Corky Crafts, MD;  Location: [facility] INVASIVE CV LAB;  Service: Cardiovascular;   Laterality: N/A;   TONSILLECTOMY     TOTAL HIP ARTHROPLASTY Right 2010   UPPER GASTROINTESTINAL ENDOSCOPY     wisdom teeth extracted      Her Family History Is Significant For: Family History  Problem Relation Age of Onset   Other Mother        arrythmia   Mental illness Mother        bipolar   Hyperlipidemia Mother    Thyroid disease Mother    Depression Mother    Bipolar disorder Mother    Breast cancer Mother 63   Heart disease Father    Arthritis Father        rheumatoid   Hypertension Father    Hyperlipidemia Father    Depression Sister    Mental illness Sister        bipolar   Parkinson's disease Sister    Arthritis Sister    Arthritis Sister    Arthritis Sister    Breast cancer Maternal Aunt 35   Breast cancer Maternal Uncle        58s   Heart attack Paternal Uncle    Heart attack Paternal Grandfather    Breast cancer Cousin 15   Colon cancer Neg Hx    Esophageal cancer Neg Hx    Rectal cancer Neg Hx    Stomach cancer Neg Hx    Sleep apnea Neg Hx     Her Social History Is Significant For: Social History   Socioeconomic History   Marital status: Married    Spouse name: Not on file   Number of children: 1   Years of education: Not on file   Highest education level: Master's degree (e.g., MA, MS, MEng, MEd, MSW, MBA)  Occupational History   Occupation: Retired International aid/development worker  Tobacco Use   Smoking status: Never   Smokeless tobacco: Never  Vaping Use   Vaping status: Never Used  Substance and Sexual Activity   Alcohol use: Yes    Alcohol/week: 1.0 standard drink of alcohol    Types: 1 Glasses of wine per week    Comment: occasional wine   Drug use: No   Sexual activity: Not Currently    Partners: Male  Other Topics Concern   Not on file  Social History Narrative   Lives with husband, 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 3 grandsons- 2 in  [place] and 51 year old in [place] in 2023      Hobbies: book club, time with friends, time with couples friends, plays pianos   Social Determinants of Health   Financial Resource Strain: Low Risk  (05/06/2023)   Overall Financial Resource Strain (CARDIA)    Difficulty of Paying Living Expenses:  Not hard at all  Food Insecurity: No Food Insecurity (05/06/2023)   Hunger Vital Sign    Worried About Running Out of Food in the Last Year: Never true    Ran Out of Food in the Last Year: Never true  Transportation Needs: No Transportation Needs (05/06/2023)   PRAPARE - Administrator, Civil Service (Medical): No    Lack of Transportation (Non-Medical): No  Physical Activity: Insufficiently Active (05/06/2023)   Exercise Vital Sign    Days of Exercise per Week: 4 days    Minutes of Exercise per Session: 30 min  Stress: No Stress Concern Present (05/06/2023)   [employer] of Occupational Health - Occupational Stress Questionnaire    Feeling of Stress : Only a little  Recent Concern: Stress - Stress Concern Present (03/05/2023)   [employer] of Occupational Health - Occupational Stress Questionnaire    Feeling of Stress : To some extent  Social Connections: Socially Integrated (05/06/2023)   Social Connection and Isolation Panel [NHANES]    Frequency of Communication with Friends and Family: More than three times a week    Frequency of Social Gatherings with Friends and Family: Twice a week    Attends Religious Services: More than 4 times per year    Active Member of [employer] or Organizations: Yes    Attends Banker Meetings: 1 to 4 times per year    Marital Status: Married    Her Allergies Are:  Allergies  Allergen Reactions   Scopolamine Other (See Comments)    Panic hallu  Other Reaction(s): Hallucination, Not available   Prednisone Dermatitis    Facial ulcers  Other Reaction(s): facial swelling   Bactrim [Sulfamethoxazole-Trimethoprim] Other (See Comments)     Oral ulcers and rash   Penicillins Hives and Other (See Comments)    Did it involve swelling of the face/tongue/throat, SOB, or low BP? No  Did it involve sudden or severe rash/hives, skin peeling, or any reaction on the inside of your mouth or nose? No  Did you need to seek medical attention at a hospital or doctor's office? No  When did it last happen?      30+ years ago  If all above answers are "NO", may proceed with cephalosporin use.   Sulfa Antibiotics     Other Reaction(s): Not available  :   Her Current Medications Are:  Outpatient Encounter Medications as of 07/04/2023  Medication Sig   carbidopa-levodopa (SINEMET CR) 50-200 MG tablet TAKE 1 TABLET BY MOUTH EVERYDAY AT BEDTIME   carbidopa-levodopa (SINEMET IR) 25-100 MG tablet TAKE 2 TABLETS BY MOUTH AT 7AM, 2TABLETS AT 11AM, 1 TABLET AT 4PM   carvedilol (COREG) 3.125 MG tablet TAKE 1 TABLET BY MOUTH TWICE A DAY WITH MEALS   DULoxetine (CYMBALTA) 30 MG capsule TAKE 1 CAPSULE BY MOUTH EVERY DAY   entacapone (COMTAN) 200 MG tablet Take 1 tablet (200 mg total) by mouth 3 (three) times daily.   escitalopram (LEXAPRO) 20 MG tablet Take 1 tablet (20 mg total) by mouth daily.   folic acid (FOLVITE) 1 MG tablet Take 1 mg by mouth 2 (two) times daily.   losartan (COZAAR) 50 MG tablet TAKE 1 TABLET BY MOUTH EVERY DAY   omeprazole (PRILOSEC) 20 MG capsule TAKE 1 CAPSULE BY MOUTH 2 TIMES DAILY AS NEEDED.   Plecanatide (TRULANCE) 3 MG TABS Take 1 tablet (3 mg total) by mouth daily.   polyethylene glycol (MIRALAX / GLYCOLAX) 17 g packet Take 17  g by mouth daily as needed for mild constipation.   pramipexole (MIRAPEX) 0.5 MG tablet Take 1 tablet (0.5 mg total) by mouth 3 (three) times daily.   rosuvastatin (CRESTOR) 10 MG tablet TAKE 1 TABLET BY MOUTH ON MONDAYS,WEDNESDAYS, AND FRIDAYS   Vitamin D, Ergocalciferol, (DRISDOL) 1.25 MG (50000 UNIT) CAPS capsule TAKE 1 CAPSULE (50,000 UNITS TOTAL) BY MOUTH EVERY 7 (SEVEN) DAYS   ALPRAZolam  (XANAX) 0.25 MG tablet TAKE 1 TABLET BY MOUTH 2 TIMES DAILY AS NEEDED FOR ANXIETY. (Patient not taking: Reported on 06/05/2023)   No facility-administered encounter medications on file as of 07/04/2023.  :   Review of Systems:  Out of a complete 14 point review of systems, all are reviewed and negative with the exception of these symptoms as listed below:   Review of Systems  Neurological:        Pt here for sleep consult Pt snores,moans in sleep.,fatgue,controlled BP Pt denies sleep study,cpap machine,headaches Pt states she has parkinson   ESS FSS    Objective:  Neurological Exam  Physical Exam Physical Examination:   Vitals:   07/04/23 1226  BP: 124/69  Pulse: 79    General Examination: The patient is a very pleasant 72 y.o. female in no acute distress. She appears well-developed and well-nourished and well groomed.   HEENT: Normocephalic, atraumatic, pupils are equal, round and reactive to light, corrective eyeglasses in place.  Mild facial masking noted, no significant hypophonia or dysarthria.  Airway examination reveals mild mouth dryness, adequate dental hygiene, mild airway crowding secondary to small airway and redundant soft palate, tonsils absent, Mallampati class II.  Neck circumference 17-5/8 inches, mild overbite noted.  Tongue protrudes centrally and palate elevates symmetrically.    Chest: Clear to auscultation without wheezing, rhonchi or crackles noted.  Heart: S1+S2+0, regular and normal without murmurs, rubs or gallops noted.   Abdomen: Soft, non-tender and non-distended.  Extremities: There is mild puffiness noted around both ankles.     Skin: Warm and dry without trophic changes noted.   Musculoskeletal: exam reveals no obvious joint deformities.   Neurologically:  Mental status: The patient is awake, alert and oriented in all 4 spheres. Her immediate and remote memory, attention, language skills and fund of knowledge are appropriate. There is no  evidence of aphasia, agnosia, apraxia or anomia. Speech is clear with normal prosody and enunciation. Thought process is linear. Mood is normal and affect is normal.  Cranial nerves II - XII are as described above under HEENT exam.  Motor exam: Normal bulk, moving all 4 extremities.  She has an intermittent resting tremor in the right upper and lower extremities more than left upper and lower extremities.  She has mild right foot dyskinesias.    Fine motor skills and coordination: Globally mildly impaired.  Cerebellar testing: No dysmetria or intention tremor. There is no truncal or gait ataxia.  Sensory exam: intact to light touch in the upper and lower extremities.  Gait, station and balance: She stands slowly and with mild difficulty, she pushes herself up.  She walks with a walking/hiking stick.   Assessment and Plan:  In summary, FRANCENE PURDUM is a very pleasant 72 y.o.-year old female with an underlying complex medical history of anemia, arthritis, basal cell cancer, depression, anxiety, reflux disease, hypertension, hyperlipidemia, low back pain, rosacea, parkinsonism (followed by [facility] neurology), and severe obesity with a BMI of over 40, whose history and physical exam are concerning for sleep disordered breathing, particularly obstructive sleep apnea (  OSA).  While a laboratory attended sleep study is typically considered "gold standard" for evaluation of sleep disordered breathing, we mutually agreed to proceed with a home sleep test at this time.  We talked about the challenges of sleep difficulty in the context of mood disorders and underlying Parkinson's disease and how these conditions are interconnected.  She is encouraged to try melatonin at night for sleep, starting with 2 mg or 3 mg, she can increase to 5 mg or up to 10 mg if need be.   I had a long chat with the patient about my findings and the diagnosis of sleep apnea, particularly OSA, its prognosis and treatment options. We talked  about medical/conservative treatments, surgical interventions and non-pharmacological approaches for symptom control. I explained, in particular, the risks and ramifications of untreated moderate to severe OSA, especially with respect to developing cardiovascular disease down the road, including congestive heart failure (CHF), difficult to treat hypertension, cardiac arrhythmias (particularly A-fib), neurovascular complications including TIA, stroke and dementia. Even type 2 diabetes has, in part, been linked to untreated OSA. Symptoms of untreated OSA may include (but may not be limited to) daytime sleepiness, nocturia (i.e. frequent nighttime urination), memory problems, mood irritability and suboptimally controlled or worsening mood disorder such as depression and/or anxiety, lack of energy, lack of motivation, physical discomfort, as well as recurrent headaches, especially morning or nocturnal headaches. We talked about the importance of maintaining a healthy lifestyle and striving for healthy weight.  I recommended a sleep study at this time. I outlined the differences between a laboratory attended sleep study which is considered more comprehensive and accurate over the option of a home sleep test (HST); the latter may lead to underestimation of sleep disordered breathing in some instances and does not help with diagnosing upper airway resistance syndrome and is not accurate enough to diagnose primary central sleep apnea typically. I outlined possible surgical and non-surgical treatment options of OSA, including the use of a positive airway pressure (PAP) device (i.e. CPAP, AutoPAP/APAP or BiPAP in certain circumstances), a custom-made dental device (aka oral appliance, which would require a referral to a specialist dentist or orthodontist typically, and is generally speaking not considered for patients with full dentures or edentulous state), upper airway surgical options, such as traditional UPPP (which is  not considered a first-line treatment) or the Inspire device (hypoglossal nerve stimulator, which would involve a referral for consultation with an ENT surgeon, after careful selection, following inclusion criteria - also not first-line treatment). I explained the PAP treatment option to the patient in detail, as this is generally considered first-line treatment.  The patient indicated that she would be willing to try PAP therapy, if the need arises. I explained the importance of being compliant with PAP treatment, not only for insurance purposes but primarily to improve patient's symptoms symptoms, and for the patient's long term health benefit, including to reduce Her cardiovascular risks longer-term.    We will pick up our discussion about the next steps and treatment options after testing.  We will keep her posted as to the test results by phone call and/or MyChart messaging where possible.  We will plan to follow-up in sleep clinic accordingly as well.  I answered all her questions today and the patient was in agreement.   I encouraged her to call with any interim questions, concerns, problems or updates or email [place] through MyChart.  Generally speaking, sleep test authorizations may take up to 2 weeks, sometimes less, sometimes longer, the  patient is encouraged to get in touch with [place] if they do not hear back from the sleep lab staff directly within the next 2 weeks.  Thank you very much for allowing me to participate in the care of this nice patient. If I can be of any further assistance to you please do not hesitate to call me at [phone].  Sincerely,   Huston Foley, MD, PhD

## 2023-07-04 NOTE — Patient Instructions (Signed)

## 2023-07-12 DIAGNOSIS — M1712 Unilateral primary osteoarthritis, left knee: Secondary | ICD-10-CM | POA: Diagnosis not present

## 2023-07-18 ENCOUNTER — Telehealth: Payer: Self-pay | Admitting: Neurology

## 2023-07-18 NOTE — Telephone Encounter (Signed)
HST - Medicare/[employer] [place]/[employer]BS state no auth req   Patient is scheduled at [facility] for 08/07/23 at 8 AM  Mailed packet to the patient.

## 2023-07-22 NOTE — Progress Notes (Unsigned)
Virtual Visit via Video Note  I connected with Kristin [name] on 07/26/23 at  3:30 PM EST by a video enabled telemedicine application and verified that I am speaking with the correct person using two identifiers.  Location: Patient: home Provider: office Persons participated in the visit- patient, provider    I discussed the limitations of evaluation and management by telemedicine and the availability of in person appointments. The patient expressed understanding and agreed to proceed.   I discussed the assessment and treatment plan with the patient. The patient was provided an opportunity to ask questions and all were answered. The patient agreed with the plan and demonstrated an understanding of the instructions.   The patient was advised to call back or seek an in-person evaluation if the symptoms worsen or if the condition fails to improve as anticipated.  I provided 25 minutes of non-face-to-face time during this encounter.   Neysa Hotter, MD    [facility] MD/PA/NP OP Progress Note  07/26/2023 5:02 PM Kristin [name]  MRN:  [id number]  Chief Complaint:  Chief Complaint  Patient presents with   Follow-up   HPI:  -per chart review, she was referred and was scheduled for home sleep study This is a follow-up appointment for depression and anxiety.  She states that she has been doing okay.  She has profuse sweating, which occurs when she was running late, or when she meets with group of people.  She talks about an episode of her receiving the invitation, which caused anxiety and sweating.  He does not occur when she is at home.  She stopped seeing her friend after going to the gym.  She is afraid of falling, and has concern that when she gets out from the car. She does not have any anxiety if she were to go back home.  Although she used to be extrovert, she also stayed in the house lately.  She believes entacapone has been helpful for internal shaking.  She continues to have occasional  choking when she drinks water, although she denies any concern about solid food. She has tearfulness at times. She denies SI. She sleeps well, and is waiting for the result of the sleep test.    Visit Diagnosis:    ICD-10-CM   1. MDD (major depressive disorder), recurrent episode, mild (HCC)  F33.0     2. Anxiety disorder, unspecified type  F41.9     3. Insomnia, unspecified type  G47.00       Past Psychiatric History: Please see initial evaluation for full details. I have reviewed the history. No updates at this time.     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   Hyperlipidemia    Hypertension    Infertility, female    Low back pain 10/17/2007   Qualifier:  Diagnosis of  By: Lovell Sheehan MD, [name]    Measles as a child   Obesity    Osteoarthritis    Parkinson disease (HCC)    Plantar fasciitis of left foot 06/19/2012   Preventative health care 12/19/2015   Rheumatoid arthritis (HCC) 01/08/2018   Rosacea 10/05/2014   Swallowing difficulty    Urinary frequency 02/25/2013   Visual floaters 05/11/2014    Past Surgical History:  Procedure Laterality Date   ABDOMINAL HYSTERECTOMY  2006   total   esophageal     stretching   HERNIA REPAIR N/A 2022   hiatal hernia   laporoscopy     LEFT HEART CATH AND CORONARY ANGIOGRAPHY N/A 06/02/2019   Procedure: LEFT HEART CATH AND CORONARY ANGIOGRAPHY;  Surgeon:  Corky Crafts, MD;  Location: [facility] INVASIVE CV LAB;  Service: Cardiovascular;  Laterality: N/A;   TONSILLECTOMY     TOTAL HIP ARTHROPLASTY Right 2010   UPPER GASTROINTESTINAL ENDOSCOPY     wisdom teeth extracted      Family Psychiatric History: Please see initial evaluation for full details. I have reviewed the history. No updates at this time.     Family History:  Family History  Problem Relation Age of Onset   Other Mother        arrythmia   Mental illness Mother        bipolar   Hyperlipidemia Mother    Thyroid disease Mother    Depression Mother    Bipolar disorder Mother    Breast cancer Mother 25   Heart disease Father    Arthritis Father        rheumatoid   Hypertension Father    Hyperlipidemia Father    Depression Sister    Mental illness Sister        bipolar   Parkinson's disease Sister    Arthritis Sister    Arthritis Sister    Arthritis Sister    Breast cancer Maternal Aunt 35   Breast cancer Maternal Uncle        7s   Heart attack Paternal Uncle    Heart attack Paternal Grandfather    Breast cancer Cousin 66   Colon cancer Neg Hx    Esophageal cancer Neg Hx    Rectal cancer Neg Hx    Stomach cancer Neg Hx    Sleep apnea Neg Hx     Social History:  Social History   Socioeconomic History   Marital status: Married    Spouse name: Not on file   Number of children: 1   Years of education: Not on file   Highest education level: Master's degree (e.g., MA, MS, MEng, MEd, MSW, MBA)  Occupational History   Occupation: Retired International aid/development worker  Tobacco Use   Smoking status: Never   Smokeless tobacco: Never  Vaping Use   Vaping status: Never Used  Substance and Sexual Activity   Alcohol use: Yes    Alcohol/week: 1.0 standard drink of alcohol    Types: 1 Glasses of wine per week    Comment: occasional wine   Drug use: No   Sexual activity: Not Currently    Partners: Male  Other Topics Concern   Not on file  Social History Narrative    Lives with husband, 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 3 grandsons- 2 in [place] and 34 year old in [place] in 2023  Hobbies: book club, time with friends, time with couples friends, plays pianos   Social Determinants of Health   Financial Resource Strain: Low Risk  (05/06/2023)   Overall Financial Resource Strain (CARDIA)    Difficulty of Paying Living Expenses: Not hard at all  Food Insecurity: No Food Insecurity (05/06/2023)   Hunger Vital Sign    Worried About Running Out of Food in the Last Year: Never true    Ran Out of Food in the Last Year: Never true  Transportation Needs: No Transportation Needs (05/06/2023)   PRAPARE - Administrator, Civil Service (Medical): No    Lack of Transportation (Non-Medical): No  Physical Activity: Insufficiently Active (05/06/2023)   Exercise Vital Sign    Days of Exercise per Week: 4 days    Minutes of Exercise per Session: 30 min  Stress: No Stress Concern Present (05/06/2023)   [employer] of Occupational Health - Occupational Stress Questionnaire    Feeling of Stress : Only a little  Recent Concern: Stress - Stress Concern Present (03/05/2023)   [employer] of Occupational Health - Occupational Stress Questionnaire    Feeling of Stress : To some extent  Social Connections: Socially Integrated (05/06/2023)   Social Connection and Isolation Panel [NHANES]    Frequency of Communication with Friends and Family: More than three times a week    Frequency of Social Gatherings with Friends and Family: Twice a week    Attends Religious Services: More than 4 times per year    Active Member of [employer] or Organizations: Yes    Attends Banker Meetings: 1 to 4 times per year    Marital Status: Married    Allergies:  Allergies  Allergen Reactions   Scopolamine Other (See Comments)    Panic hallu  Other Reaction(s):  Hallucination, Not available   Prednisone Dermatitis    Facial ulcers  Other Reaction(s): facial swelling   Bactrim [Sulfamethoxazole-Trimethoprim] Other (See Comments)    Oral ulcers and rash   Penicillins Hives and Other (See Comments)    Did it involve swelling of the face/tongue/throat, SOB, or low BP? No  Did it involve sudden or severe rash/hives, skin peeling, or any reaction on the inside of your mouth or nose? No  Did you need to seek medical attention at a hospital or doctor's office? No  When did it last happen?      30+ years ago  If all above answers are "NO", may proceed with cephalosporin use.   Sulfa Antibiotics     Other Reaction(s): Not available    Metabolic Disorder Labs: Lab Results  Component Value Date   HGBA1C 6.0 03/28/2023   MPG 126 (H) 12/29/2013   No results found for: "PROLACTIN" Lab Results  Component Value Date   CHOL 147 03/28/2023   TRIG 114.0 03/28/2023   HDL 63.80 03/28/2023   CHOLHDL 2 03/28/2023   VLDL 22.8 03/28/2023   LDLCALC 60 03/28/2023   LDLCALC 58 01/12/2022   Lab Results  Component Value Date   TSH 1.10 01/12/2022   TSH 1.20 10/03/2021    Therapeutic Level Labs: No results found for: "LITHIUM" No results found for: "VALPROATE" No results found for: "CBMZ"  Current Medications: Current Outpatient Medications  Medication Sig Dispense Refill   DULoxetine (CYMBALTA) 20 MG capsule Take 1 capsule (20 mg total) by mouth daily. 30 capsule 1   ALPRAZolam (XANAX) 0.25 MG tablet TAKE 1 TABLET BY MOUTH 2 TIMES DAILY AS NEEDED FOR  ANXIETY. (Patient not taking: Reported on 06/05/2023) 30 tablet 1   carbidopa-levodopa (SINEMET CR) 50-200 MG tablet TAKE 1 TABLET BY MOUTH EVERYDAY AT BEDTIME 90 tablet 1   carbidopa-levodopa (SINEMET IR) 25-100 MG tablet TAKE 2 TABLETS BY MOUTH AT 7AM, 2TABLETS AT 11AM, 1 TABLET AT 4PM 450 tablet 2   carvedilol (COREG) 3.125 MG tablet TAKE 1 TABLET BY MOUTH TWICE A DAY WITH MEALS 180 tablet 3    DULoxetine (CYMBALTA) 30 MG capsule TAKE 1 CAPSULE BY MOUTH EVERY DAY (Patient not taking: Reported on 07/26/2023) 90 capsule 1   entacapone (COMTAN) 200 MG tablet Take 1 tablet (200 mg total) by mouth 3 (three) times daily. 270 tablet 1   escitalopram (LEXAPRO) 20 MG tablet Take 1 tablet (20 mg total) by mouth daily. 90 tablet 3   folic acid (FOLVITE) 1 MG tablet Take 1 mg by mouth 2 (two) times daily.     losartan (COZAAR) 50 MG tablet TAKE 1 TABLET BY MOUTH EVERY DAY 90 tablet 3   omeprazole (PRILOSEC) 20 MG capsule TAKE 1 CAPSULE BY MOUTH 2 TIMES DAILY AS NEEDED. 180 capsule 1   Plecanatide (TRULANCE) 3 MG TABS Take 1 tablet (3 mg total) by mouth daily. 30 tablet 3   polyethylene glycol (MIRALAX / GLYCOLAX) 17 g packet Take 17 g by mouth daily as needed for mild constipation.     pramipexole (MIRAPEX) 0.5 MG tablet Take 1 tablet (0.5 mg total) by mouth 3 (three) times daily. 270 tablet 1   rosuvastatin (CRESTOR) 10 MG tablet TAKE 1 TABLET BY MOUTH ON MONDAYS,WEDNESDAYS, AND FRIDAYS 39 tablet 3   Vitamin D, Ergocalciferol, (DRISDOL) 1.25 MG (50000 UNIT) CAPS capsule TAKE 1 CAPSULE (50,000 UNITS TOTAL) BY MOUTH EVERY 7 (SEVEN) DAYS 13 capsule 1   No current facility-administered medications for this visit.     Musculoskeletal: Strength & Muscle Tone: within normal limits Gait & Station: normal Patient leans: N/A  Psychiatric Specialty Exam: Review of Systems  Psychiatric/Behavioral:  Positive for sleep disturbance. Negative for agitation, behavioral problems, confusion, decreased concentration, dysphoric mood, hallucinations, self-injury and suicidal ideas. The patient is nervous/anxious. The patient is not hyperactive.   All other systems reviewed and are negative.   There were no vitals taken for this visit.There is no height or weight on file to calculate BMI.  General Appearance: Well Groomed  Eye Contact:  Good  Speech:  Clear and Coherent  Volume:  Normal  Mood:   good   Affect:  Appropriate, Congruent, and Full Range  Thought Process:  Coherent  Orientation:  Full (Time, Place, and Person)  Thought Content: Logical   Suicidal Thoughts:  No  Homicidal Thoughts:  No  Memory:  Immediate;   Good  Judgement:  Good  Insight:  Good  Psychomotor Activity:  Normal  Concentration:  Concentration: Good and Attention Span: Good  Recall:  Good  Fund of Knowledge: Good  Language: Good  Akathisia:  No  Handed:  Right  AIMS (if indicated): not done  Assets:  Communication Skills  ADL's:  Intact  Cognition: WNL  Sleep:  Fair   Screenings: GAD-7    Flowsheet Row Office Visit from 05/09/2023 in [facility] from 03/28/2023 in [facility] from 01/23/2023 in [facility]  Total GAD-7 Score 5 6 0      Mini-Mental    Flowsheet Row Office Visit from 10/15/2017 in [facility]  Total Score (max 30 points ) 30      PHQ2-9    Flowsheet Row Office Visit from 05/09/2023 in [facility] from 03/28/2023 in [facility] Clinical Support from 03/06/2023 in [facility] Office Visit from 01/23/2023 in [facility] from 09/27/2022 in [facility]  PHQ-2 Total Score 0 4 0 0 0  PHQ-9 Total Score 9 15 0 0 0      Flowsheet Row ED from 03/22/2023 in [facility] Emergency Department at [facility] ED from 12/10/2020 in [facility] Emergency Department at [facility] ED from 10/06/2020 in [facility] Emergency Department at [facility]  C-SSRS RISK CATEGORY No Risk No Risk Error: Question 2 not populated        Assessment and Plan:  Kristin [name] is a 72 y.o. year old female with a history of depression, anxiety, parkinson, seronegative, Nonerosive RA in remission,  hypertension,  vitamin B 12/D deficiency, gastroparesis, who is referred for depression, anxiety.   1. MDD (major depressive disorder), recurrent episode, mild (HCC) 2. Anxiety disorder, unspecified type Acute stressors include: fear of falling secondary to Parkinson since 2015 Other stressors include:    History: suffers from depression for many years, Originally on duloxetine 30 mg daily, lexapro 20 mg daily. Relapse in mood symptoms after trying monotherapy  She reports anxiety with diaphoresis, which often occurs in the context of fear of fall and social anxiety, although there has been improvement in inner restless since being on entacapone.  She has started to engage with her psychotherapist and is likely to benefit significantly from this process, considering the nature of her anxiety.  She is willing to taper down duloxetine to avoid polypharmacy.  We will slowly do this to avoid a relapse in her mood symptoms given she has been on both medication for many years.  Will continue Lexapro to target depression and anxiety. Previously discussed risk of serotonin syndrome, QTc prolongation.   3. Insomnia, unspecified type Overall improving.  She was able to see a sleep specialist.  Will continue to assess as needed.    # gastroparesis She reports concerns about oral intake and a fear of choking due to xerostomia and gastroparesis.  Communicated with Dr. Arbutus Leas, and she was referred for evaluation of dysphagia She as advised to follow up on this.     Plan Decrease duloxetine 20 mg daily - reduced 07/2023 Continue lexapro 20 mg daily (EKG HR 70, QTc 430 msec, 03/2023) Next appointment: 11/17 at 9:30 She sees a therapist, Etheleen Nicks-  [facility], every two weeks  The patient demonstrates the following risk factors for suicide: Chronic risk factors for suicide include: psychiatric disorder of depression, anxiety . Acute risk factors for suicide include: loss (financial, interpersonal,  professional). Protective factors for this patient include: positive social support, responsibility to others (children, family), coping skills, and hope for the future. Considering these factors, the overall suicide risk at this point appears to be low. Patient is appropriate for outpatient follow up.     Collaboration of Care: Collaboration of Care: Other reviewed notes in Epic  Patient/Guardian was advised Release of Information must be obtained prior to any record release in order to collaborate their care with an outside provider. Patient/Guardian was advised if they have not already done so to contact the registration department to sign all necessary forms in order for [place] to release information  regarding their care.   Consent: Patient/Guardian gives verbal consent for treatment and assignment of benefits for services provided during this visit. Patient/Guardian expressed understanding and agreed to proceed.   The duration of the time spent on the following activities on the date of the encounter was 35 minutes.   Preparing to see the patient (e.g., review of test, records)  Obtaining and/or reviewing separately obtained history  Performing a medically necessary exam and/or evaluation  Counseling and educating the patient/family/caregiver  Ordering medications, tests, or procedures  Referring and communicating with other healthcare professionals (when not reported separately)  Documenting clinical information in the electronic or paper health record  Independently interpreting results of tests/labs and communication of results to the family or caregiver  Care coordination (when not reported separately)   Neysa Hotter, MD 07/26/2023, 5:02 PM

## 2023-07-26 ENCOUNTER — Encounter: Payer: Self-pay | Admitting: Psychiatry

## 2023-07-26 ENCOUNTER — Telehealth: Payer: Medicare Other | Admitting: Psychiatry

## 2023-07-26 DIAGNOSIS — F330 Major depressive disorder, recurrent, mild: Secondary | ICD-10-CM | POA: Diagnosis not present

## 2023-07-26 DIAGNOSIS — G4700 Insomnia, unspecified: Secondary | ICD-10-CM | POA: Diagnosis not present

## 2023-07-26 DIAGNOSIS — F419 Anxiety disorder, unspecified: Secondary | ICD-10-CM

## 2023-07-26 MED ORDER — DULOXETINE HCL 20 MG PO CPEP: 20.0000 mg | ORAL_CAPSULE | Freq: Every day | ORAL | 1 refills | Status: DC

## 2023-07-26 NOTE — Patient Instructions (Signed)
Decrease duloxetine 20 mg daily Continue lexapro 20 mg daily  Next appointment: 11/17 at 9:30

## 2023-07-29 ENCOUNTER — Other Ambulatory Visit: Payer: Self-pay | Admitting: Family Medicine

## 2023-08-06 DIAGNOSIS — M62838 Other muscle spasm: Secondary | ICD-10-CM | POA: Diagnosis not present

## 2023-08-06 DIAGNOSIS — K5900 Constipation, unspecified: Secondary | ICD-10-CM | POA: Diagnosis not present

## 2023-08-06 DIAGNOSIS — N3941 Urge incontinence: Secondary | ICD-10-CM | POA: Diagnosis not present

## 2023-08-06 DIAGNOSIS — M6281 Muscle weakness (generalized): Secondary | ICD-10-CM | POA: Diagnosis not present

## 2023-08-06 DIAGNOSIS — M6289 Other specified disorders of muscle: Secondary | ICD-10-CM | POA: Diagnosis not present

## 2023-08-07 ENCOUNTER — Ambulatory Visit: Payer: Medicare Other | Admitting: Neurology

## 2023-08-07 DIAGNOSIS — G4733 Obstructive sleep apnea (adult) (pediatric): Secondary | ICD-10-CM | POA: Diagnosis not present

## 2023-08-07 DIAGNOSIS — G4750 Parasomnia, unspecified: Secondary | ICD-10-CM

## 2023-08-07 DIAGNOSIS — Z9189 Other specified personal risk factors, not elsewhere classified: Secondary | ICD-10-CM

## 2023-08-07 DIAGNOSIS — G4719 Other hypersomnia: Secondary | ICD-10-CM

## 2023-08-07 DIAGNOSIS — R351 Nocturia: Secondary | ICD-10-CM

## 2023-08-07 DIAGNOSIS — R0683 Snoring: Secondary | ICD-10-CM

## 2023-08-07 DIAGNOSIS — G4700 Insomnia, unspecified: Secondary | ICD-10-CM

## 2023-08-08 NOTE — Progress Notes (Signed)
See procedure note.

## 2023-08-08 NOTE — Procedures (Signed)
   [facility]  HOME SLEEP TEST (Watch PAT) REPORT  STUDY DATE: 08/07/2023  DOB: 1950/11/02  MRN: [id number]  ORDERING CLINICIAN: Huston Foley, MD, PhD   REFERRING CLINICIAN: Dr. Vanetta Shawl  CLINICAL INFORMATION/HISTORY: 72 year old female with an underlying complex medical history of anemia, arthritis, basal cell cancer, depression, anxiety, reflux disease, hypertension, hyperlipidemia, low back pain, rosacea, parkinsonism (followed by [facility] neurology), and severe obesity with a BMI of over 40, who reports chronic difficulty sleeping, sleep disruption, nonrestorative sleep, daytime tiredness and sleepiness.    Epworth sleepiness score: 14/24.  BMI: 42.9 kg/m  FINDINGS:   Sleep Summary:   Total Recording Time (hours, min): 7 hours, 37 min  Total Sleep Time (hours, min):  6 hours, 49 min  Percent REM (%):    15.8%   Respiratory Indices:   Calculated pAHI (per hour):  3.9/hour (utilizing the 4% desaturation criteria for hypopneas per Medicare guidelines)         REM pAHI:    9.6/hour       NREM pAHI: 2.8/hour  Central pAHI: 1.2/hour  Oxygen Saturation Statistics:    Oxygen Saturation (%) Mean: 94%   Minimum oxygen saturation (%):                 83%   O2 Saturation Range (%): 83 - 99%    O2 Saturation (minutes) <=88%: 0.1 min  Pulse Rate Statistics:   Pulse Mean (bpm):    66/min    Pulse Range (57 - 106/min)   IMPRESSION: Primary snoring   RECOMMENDATION:  This home sleep test does not demonstrate any significant obstructive or central sleep disordered breathing with a total AHI of less than 5/hour. Her total AHI was 3.9/hour, utilizing the 4% desaturation criteria for hypopneas per Medicare guidelines, O2 nadir of 83% without any significant time below or at 88% saturation of less than 1 minute.  Snoring was detected and appeared to be in the moderate range, at times milder, at times louder. Treatment with a positive airway pressure device such  as AutoPap or CPAP is not indicated based on this test. Snoring may improve with avoidance of the supine sleep position and weight loss (where clinically appropriate). For disturbing snoring, an oral appliance through dentistry or orthodontics can be considered (in selected candidates).  Other causes of the patient's symptoms, including circadian rhythm disturbances, an underlying mood disorder, medication effect and/or an underlying medical problem cannot be ruled out based on this test. Clinical correlation is recommended.  The patient should be cautioned not to drive, work at heights, or operate dangerous or heavy equipment when tired or sleepy. Review and reiteration of good sleep hygiene measures should be pursued with any patient. The patient will be advised to follow up with her referring provider, who will be notified of the test results.   I certify that I have reviewed the raw data recording prior to the issuance of this report in accordance with the standards of the American Academy of Sleep Medicine (AASM).    INTERPRETING PHYSICIAN:   Huston Foley, MD, PhD Medical Director, [facility]) Diplomat, ABPN (Neurology and Sleep)   [facility] [address], [address] [place], [place] [address] [phone]

## 2023-08-10 ENCOUNTER — Other Ambulatory Visit: Payer: Self-pay | Admitting: Family Medicine

## 2023-08-16 ENCOUNTER — Other Ambulatory Visit: Payer: Self-pay | Admitting: Neurology

## 2023-08-16 ENCOUNTER — Other Ambulatory Visit: Payer: Self-pay | Admitting: Family Medicine

## 2023-08-16 DIAGNOSIS — G20B1 Parkinson's disease with dyskinesia, without mention of fluctuations: Secondary | ICD-10-CM

## 2023-08-20 ENCOUNTER — Other Ambulatory Visit: Payer: Self-pay | Admitting: Psychiatry

## 2023-09-16 NOTE — Progress Notes (Signed)
Virtual Visit via Video Note  I connected with Kristin [name] on 09/21/23 at  9:30 AM EST by a video enabled telemedicine application and verified that I am speaking with the correct person using two identifiers.  Location: Patient: home Provider: office Persons participated in the visit- patient, provider    I discussed the limitations of evaluation and management by telemedicine and the availability of in person appointments. The patient expressed understanding and agreed to proceed.   I discussed the assessment and treatment plan with the patient. The patient was provided an opportunity to ask questions and all were answered. The patient agreed with the plan and demonstrated an understanding of the instructions.   The patient was advised to call back or seek an in-person evaluation if the symptoms worsen or if the condition fails to improve as anticipated.  I provided 38 minutes of non-face-to-face time during this encounter.   Neysa Hotter, MD    [facility] MD/PA/NP OP Progress Note  09/21/2023 12:25 PM Kristin [name]  MRN:  [id number]  Chief Complaint:  Chief Complaint  Patient presents with   Follow-up   HPI:  - She was seen by Dr. Frances Furbish for sleep evaluation since the last visit. "This home sleep test does not demonstrate any significant obstructive or central sleep disordered breathing with a total AHI of less than 5/hour. Her total AHI was 3.9/hour, utilizing the 4% desaturation criteria for hypopneas per Medicare guidelines, O2 nadir of 83% without any significant time below or at 88% saturation of less than 1 minute"  This is a follow-up appointment for depression and anxiety.  She states that she continues to be afraid of choking, and does not eat outside.  She is able to eat when she is at home.  She also has issues with digestion, and she talks about an episode of her ordering checking with Gaspar Garbe sauce, while she knows that she could not eat it.  She has been working with her  therapist on improving how she responds in social contexts.  She was feeling sad during holiday.  She felt guilty when she overheard the conversation between her adopted daughter and her granddaughter. She felt bad because her daughter struggled to answer questions about her birth. It has been affecting her mood since holiday. We explored her feelings and discussed possible reasons her daughter might have had difficulty answering those questions. She states that  while she has been feeling sad, she was doing worse when she tried to taper off Lexapro in the past.  She feels comfortable to stay on the current dose for now with the plan to taper it off in the future.  She denies SI.   Wt Readings from Last 3 Encounters:  09/20/23 240 lb (108.9 kg)  07/04/23 242 lb 3.2 oz (109.9 kg)  06/05/23 240 lb (108.9 kg)     Visit Diagnosis:    ICD-10-CM   1. MDD (major depressive disorder), recurrent episode, mild (HCC)  F33.0     2. Anxiety disorder, unspecified type  F41.9       Past Psychiatric History: Please see initial evaluation for full details. I have reviewed the history. No updates at this time.     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   Hyperlipidemia    Hypertension    Infertility, female    Low back pain 10/17/2007   Qualifier: Diagnosis of  By: Lovell Sheehan MD, [name]    Measles as a child   Obesity     Osteoarthritis    Parkinson disease (HCC)    Plantar fasciitis of left foot 06/19/2012   Preventative health care 12/19/2015   Rheumatoid arthritis (HCC) 01/08/2018   Rosacea 10/05/2014   Swallowing difficulty    Urinary frequency 02/25/2013   Visual floaters 05/11/2014    Past Surgical History:  Procedure Laterality Date   ABDOMINAL HYSTERECTOMY  2006   total   esophageal     stretching   HERNIA REPAIR N/A 2022   hiatal hernia   laporoscopy     LEFT HEART CATH AND CORONARY ANGIOGRAPHY N/A 06/02/2019   Procedure: LEFT HEART CATH AND CORONARY ANGIOGRAPHY;  Surgeon: Corky Crafts, MD;  Location: [facility];  Service: Cardiovascular;  Laterality: N/A;   TONSILLECTOMY     TOTAL HIP ARTHROPLASTY Right 2010   UPPER GASTROINTESTINAL ENDOSCOPY     wisdom teeth extracted      Family Psychiatric History: Please see initial evaluation for full details. I have reviewed the history. No updates at this time.     Family History:  Family History  Problem Relation Age of Onset   Other Mother        arrythmia   Mental illness Mother        bipolar   Hyperlipidemia Mother    Thyroid disease Mother    Depression Mother    Bipolar disorder Mother    Breast cancer Mother 18   Heart disease Father    Arthritis Father        rheumatoid   Hypertension Father    Hyperlipidemia Father    Depression Sister    Mental illness Sister        bipolar   Parkinson's disease Sister    Arthritis Sister    Arthritis Sister    Arthritis Sister    Breast cancer Maternal Aunt 35   Breast cancer Maternal Uncle        85s   Heart attack Paternal Uncle    Heart attack Paternal Grandfather    Breast cancer Cousin 57   Colon cancer Neg Hx    Esophageal cancer Neg Hx    Rectal cancer Neg Hx    Stomach cancer Neg Hx    Sleep apnea Neg Hx     Social History:  Social History   Socioeconomic History   Marital status: Married    Spouse name: Not on file   Number of children: 1    Years of education: Not on file   Highest education level: Master's degree (e.g., MA, MS, MEng, MEd, MSW, MBA)  Occupational History   Occupation: Retired International aid/development worker  Tobacco Use   Smoking status: Never   Smokeless tobacco: Never  Vaping Use   Vaping status: Never Used  Substance and Sexual Activity   Alcohol  use: Yes    Alcohol/week: 1.0 standard drink of alcohol    Types: 1 Glasses of wine per week    Comment: occasional wine   Drug use: No   Sexual activity: Not Currently    Partners: Male  Other Topics Concern   Not on file  Social History Narrative   Lives with husband, 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 3 grandsons- 2 in [place] and 43 year old in [place] in 2023      Hobbies: book club, time with friends, time with couples friends, plays pianos   Social Drivers of Corporate investment banker Strain: Low Risk  (05/06/2023)   Overall Financial Resource Strain (CARDIA)    Difficulty of Paying Living Expenses: Not hard at all  Food Insecurity: No Food Insecurity (05/06/2023)   Hunger Vital Sign    Worried About Running Out of Food in the Last Year: Never true    Ran Out of Food in the Last Year: Never true  Transportation Needs: No Transportation Needs (05/06/2023)   PRAPARE - Administrator, Civil Service (Medical): No    Lack of Transportation (Non-Medical): No  Physical Activity: Insufficiently Active (05/06/2023)   Exercise Vital Sign    Days of Exercise per Week: 4 days    Minutes of Exercise per Session: 30 min  Stress: No Stress Concern Present (05/06/2023)   [employer] of Occupational Health - Occupational Stress Questionnaire    Feeling of Stress : Only a little  Recent Concern: Stress - Stress Concern Present (03/05/2023)   [employer] of Occupational Health - Occupational Stress Questionnaire    Feeling of Stress : To some extent  Social  Connections: Socially Integrated (05/06/2023)   Social Connection and Isolation Panel [NHANES]    Frequency of Communication with Friends and Family: More than three times a week    Frequency of Social Gatherings with Friends and Family: Twice a week    Attends Religious Services: More than 4 times per year    Active Member of [employer] or Organizations: Yes    Attends Banker Meetings: 1 to 4 times per year    Marital Status: Married    Allergies:  Allergies  Allergen Reactions   Scopolamine Other (See Comments)    Panic hallu  Other Reaction(s): Hallucination, Not available   Prednisone Dermatitis    Facial ulcers  Other Reaction(s): facial swelling   Bactrim [Sulfamethoxazole-Trimethoprim] Other (See Comments)    Oral ulcers and rash   Penicillins Hives and Other (See Comments)    Did it involve swelling of the face/tongue/throat, SOB, or low BP? No  Did it involve sudden or severe rash/hives, skin peeling, or any reaction on the inside of your mouth or nose? No  Did you need to seek medical attention at a hospital or doctor's office? No  When did it last happen?      30+ years ago  If all above answers are "NO", may proceed with cephalosporin use.   Sulfa Antibiotics     Other Reaction(s): Not available    Metabolic Disorder Labs: Lab Results  Component Value Date   HGBA1C 6.0 03/28/2023   MPG 126 (H) 12/29/2013   No results found for: "PROLACTIN" Lab Results  Component Value Date   CHOL 147 03/28/2023   TRIG 114.0 03/28/2023   HDL 63.80 03/28/2023   CHOLHDL 2  03/28/2023   VLDL 22.8 03/28/2023   LDLCALC 60 03/28/2023   LDLCALC 58 01/12/2022   Lab Results  Component Value Date   TSH 1.10 01/12/2022   TSH 1.20 10/03/2021    Therapeutic Level Labs: No results found for: "LITHIUM" No results found for: "VALPROATE" No results found for: "CBMZ"  Current Medications: Current Outpatient Medications  Medication Sig Dispense Refill    carbidopa-levodopa (SINEMET CR) 50-200 MG tablet TAKE 1 TABLET BY MOUTH EVERYDAY AT BEDTIME 90 tablet 1   carbidopa-levodopa (SINEMET IR) 25-100 MG tablet TAKE 2 TABLETS BY MOUTH AT 7AM, 2TABLETS AT 11AM, 1 TABLET AT 4PM 450 tablet 2   carvedilol (COREG) 3.125 MG tablet TAKE 1 TABLET BY MOUTH TWICE A DAY WITH FOOD 180 tablet 3   DULoxetine (CYMBALTA) 20 MG capsule Take 1 capsule (20 mg total) by mouth daily. 90 capsule 0   entacapone (COMTAN) 200 MG tablet TAKE 1 TABLET BY MOUTH 3 TIMES DAILY. 270 tablet 0   escitalopram (LEXAPRO) 20 MG tablet Take 1 tablet (20 mg total) by mouth daily. 90 tablet 3   folic acid (FOLVITE) 1 MG tablet Take 1 mg by mouth 2 (two) times daily.     losartan (COZAAR) 50 MG tablet TAKE 1 TABLET BY MOUTH EVERY DAY 90 tablet 3   omeprazole (PRILOSEC) 20 MG capsule TAKE 1 CAPSULE BY MOUTH 2 TIMES DAILY AS NEEDED. 180 capsule 1   Plecanatide (TRULANCE) 3 MG TABS Take 1 tablet (3 mg total) by mouth daily. 30 tablet 3   polyethylene glycol (MIRALAX / GLYCOLAX) 17 g packet Take 17 g by mouth daily as needed for mild constipation.     pramipexole (MIRAPEX) 0.5 MG tablet Take 1 tablet (0.5 mg total) by mouth 3 (three) times daily. 270 tablet 1   rosuvastatin (CRESTOR) 10 MG tablet TAKE 1 TABLET BY MOUTH ON MONDAYS,WEDNESDAYS, AND FRIDAYS 39 tablet 3   Vitamin D, Ergocalciferol, (DRISDOL) 1.25 MG (50000 UNIT) CAPS capsule TAKE 1 CAPSULE (50,000 UNITS TOTAL) BY MOUTH EVERY 7 (SEVEN) DAYS 12 capsule 1   No current facility-administered medications for this visit.     Musculoskeletal: Strength & Muscle Tone:  N/A Gait & Station:  N/A Patient leans: N/A  Psychiatric Specialty Exam: Review of Systems  Psychiatric/Behavioral:  Positive for dysphoric mood. Negative for agitation, behavioral problems, confusion, decreased concentration, hallucinations, self-injury, sleep disturbance and suicidal ideas. The patient is nervous/anxious. The patient is not hyperactive.   All other  systems reviewed and are negative.   There were no vitals taken for this visit.There is no height or weight on file to calculate BMI.  General Appearance: Well Groomed  Eye Contact:  Good  Speech:  Clear and Coherent  Volume:  Normal  Mood:   fine  Affect:  Appropriate, Congruent, and Tearful  Thought Process:  Coherent  Orientation:  Full (Time, Place, and Person)  Thought Content: Logical   Suicidal Thoughts:  No  Homicidal Thoughts:  No  Memory:  Immediate;   Good  Judgement:  Good  Insight:  Good  Psychomotor Activity:  Normal  Concentration:  Concentration: Good and Attention Span: Good  Recall:  Good  Fund of Knowledge: Good  Language: Good  Akathisia:  No  Handed:  Right  AIMS (if indicated): not done  Assets:  Communication Skills Desire for Improvement  ADL's:  Intact  Cognition: WNL  Sleep:  Fair   Screenings: GAD-7    Flowsheet Row Office Visit from 05/09/2023 in [facility] at [employer] from 03/28/2023 in [facility] at [facility] [employer] from 01/23/2023 in [facility]  Total GAD-7 Score 5 6 0      Mini-Mental    Flowsheet Row Office Visit from 10/15/2017 in [facility] at [facility]  Total Score (max 30 points ) 30      PHQ2-9    Flowsheet Row Office Visit from 05/09/2023 in [facility] [employer] Visit from 03/28/2023 in [facility] Clinical Support from 03/06/2023 in [facility] [employer] Visit from 01/23/2023 in [facility] [employer] from 09/27/2022 in [facility] [place]  PHQ-2 Total Score 0 4 0 0 0  PHQ-9 Total Score 9 15 0 0 0      Flowsheet Row ED from 03/22/2023 in [facility] at [facility] ED from 12/10/2020 in [facility] at [facility] ED from 10/06/2020 in [facility] Department at [facility]  C-SSRS RISK CATEGORY No Risk No Risk Error: Question 2 not populated        Assessment and Plan:  Kristin [name] is a 73 y.o. 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   1. MDD (major depressive disorder), recurrent episode, mild (HCC) 2. Anxiety disorder, unspecified type Acute stressors include: fear of falling secondary to Parkinson since 2015 Other stressors include:    History: suffers from depression for many years, Originally on duloxetine 30 mg daily, lexapro 20 mg daily. Relapse in mood symptoms after trying monotherapy   Exam is notable for tearfulness as she expresses her concerns regarding the adoption of her children.  She continues to experience anxiety related to a fear of choking; however, she has been able to engage well in social activities and maintains a strong connection with her family.  While there is a concern of polypharmacy of taking both SSRI/SNRI, we will plan to slowly tapering off duloxetine to avoid relapse in her mood symptoms.  Will continue current dose of Lexapro and duloxetine for now to target depression and anxiety. Previously discussed risk of serotonin syndrome, QTc prolongation.    # gastroparesis Speech therapy referral was made by Dr. Arbutus Leas after communicating the patient concerns.  The patient also expresses interest in seeing a dietitian to explore potential options for incorporating protein into soft foods.  Will communicate with her primary care about this.     Plan Continue duloxetine 20 mg daily - reduced 07/2023 Continue lexapro 20 mg daily (EKG HR 70, QTc 430 msec, 03/2023) Next appointment: 11/17 at 9:30 Sent message to Dr. Durene Cal regarding  dietician/nutrition consult She sees a therapist, Etheleen [facility], every two weeks   The patient demonstrates the following risk factors for suicide: Chronic risk factors for suicide include: psychiatric disorder of depression, anxiety . Acute risk factors for suicide include: loss (financial, interpersonal, professional). Protective factors for this patient include: positive social support, responsibility to others (children, family), coping skills, and hope for the future. Considering these factors, the overall suicide risk at this point appears to be low. Patient is appropriate for outpatient follow up.  Collaboration of Care: Collaboration of Care: Other reviewed notes in Epic, collaborate with her primary care  Patient/Guardian was advised Release of Information must be obtained prior to any record release in order to collaborate their care with an outside provider. Patient/Guardian was advised if they have not already done so to contact the registration department to sign all necessary forms in order for [place] to release information regarding their care.   Consent: Patient/Guardian gives verbal consent for treatment and assignment of benefits for services provided during this visit. Patient/Guardian expressed understanding and agreed to proceed.   The duration of the time spent on the following activities on the date of the encounter was 38 minutes.   Preparing to see the patient (e.g., review of test, records)  Obtaining and/or reviewing separately obtained history  Performing a medically necessary exam and/or evaluation  Counseling and educating the patient/family/caregiver  Ordering medications, tests, or procedures  Referring and communicating with other healthcare professionals (when not reported separately)  Documenting clinical information in the electronic or paper health record  Neysa Hotter, MD 09/21/2023, 12:25 PM

## 2023-09-17 NOTE — Progress Notes (Signed)
Assessment/Plan:   1.  Parkinsons Disease             -Continue pramipexole 0.5 mg 3 times per day.  Higher dosages with SE and still with minimal compulsive SE (cleaning house)             -Continue carbidopa/levodopa 25/100, 2/2/1 (can chew half to 1 in the middle of the night if awakens with tremor).              -She will continue Carbidopa/levodopa 50/200 at night.  This has definitely helped restless leg and has helped most of the nighttime cramping of the feet and legs.  -continue entacapone, 200 mg with each dose of levodopa.  It has helped with inner tremor  -she asked about difference between dyskinesia and tremor and we discussed this today  -she is following with dermatology at [facility] dermatology  -discussed TWO trekking poles.  Discussed walker and walker types 2.  Depression and anxiety             - She is on a combination of Lexapro and Cymbalta.  Dr. Vanetta Shawl is working on tapering the Cymbalta.  -Patient is following with a counselor. 3.  Dysphagia             -last MBE May, 2021 with mild pharyngeal cervical esophageal deficits.  It was recommended to consider esophageal assessment.  She has an EGD upcoming to assess and an esophageal barium swallow 4.  Rheumatoid arthritis             -On methotrexate.  Following with [facility]ology 5.  REM behavior disorder             -Understands safety associated with this.  Does not want more medications. 6.  Gastroparesis             -Following with Dr. Arta Silence.  She plans to make another appt to discuss.  -Failed Linzess and trulance 7.  Excess sweating  -is long term but discussed with her that I don't want to add meds b/c clonidine is tx and she already has dizziness.    -cymbalta may be contributing to the sweating but she tried to d/c and was crying all the time.  Dr. Vanetta Shawl is working on tapering it down.  She is currently on 20 mg.  She is on Lexapro.  -may have some mild Neurogenic Orthostatic Hypotension.    -she is  on losarten and carvidelol.  She has upcoming appt with Dr. [facility] Salvia and I asked her to check her BP at home in various positions between now and then.  Discussed concept of permissive HTN 9.  EDS  -Saw Dr. Frances Furbish in October, 2024 and nocturnal polysomnogram was negative.  -still having some trouble with sleep and pt is going to follow back up as she didn't yet get results yet either from her psg 10.  Urinary incontinence  -has done pelvic floor PT with benefit at day but struggles at night still  Subjective:   Kristin [name] was seen today in follow up for Parkinsons disease.  My previous records were reviewed prior to todays visit as well as outside records available to me.  Husband with patient and supplements history.  She is doing fairly well from a Parkinson's standpoint.  We added entacapone at our last visit this did seem to help the inner tremors.  She saw Dr. Vanetta Shawl who has been working on tapering down the Cymbalta.  She did  go to [facility] neurology since last visit and had a sleep study which was negative.  Some trouble sleeping, per pt husband.  She is continuing to have GI issues with constipation.  She states that she was told that there was nothing else they could do.  She is exercising daily.   She is on entacapone and thinks "it really stopped the tremor but it may have made me dizzy."  She wants to continue it.    Current prescribed movement disorder medications: Carbidopa/levodopa 25/100, 2 tablets in the morning, 2 in the afternoon and 1 in the evening (7am/then between 11am-noon/4pm) Entacapone, 200 mg, 1 tablet 3 times per day with each dose of levodopa Carbidopa/levodopa 50/200 at bedtime Pramipexole 0.5 mg 3 times per day   Prior meds: pramipexole (higher dosages with mild compulsive behavior - cleaning/some shopping)   ALLERGIES:   Allergies  Allergen Reactions   Scopolamine Other (See Comments)    Panic hallu  Other Reaction(s): Hallucination, Not available    Prednisone Dermatitis    Facial ulcers  Other Reaction(s): facial swelling   Bactrim [Sulfamethoxazole-Trimethoprim] Other (See Comments)    Oral ulcers and rash   Penicillins Hives and Other (See Comments)    Did it involve swelling of the face/tongue/throat, SOB, or low BP? No  Did it involve sudden or severe rash/hives, skin peeling, or any reaction on the inside of your mouth or nose? No  Did you need to seek medical attention at a hospital or doctor's office? No  When did it last happen?      30+ years ago  If all above answers are "NO", may proceed with cephalosporin use.   Sulfa Antibiotics     Other Reaction(s): Not available    CURRENT MEDICATIONS:  Outpatient Encounter Medications as of 09/20/2023  Medication Sig   carbidopa-levodopa (SINEMET CR) 50-200 MG tablet TAKE 1 TABLET BY MOUTH EVERYDAY AT BEDTIME   carbidopa-levodopa (SINEMET IR) 25-100 MG tablet TAKE 2 TABLETS BY MOUTH AT 7AM, 2TABLETS AT 11AM, 1 TABLET AT 4PM   carvedilol (COREG) 3.125 MG tablet TAKE 1 TABLET BY MOUTH TWICE A DAY WITH FOOD   DULoxetine (CYMBALTA) 20 MG capsule Take 1 capsule (20 mg total) by mouth daily.   entacapone (COMTAN) 200 MG tablet TAKE 1 TABLET BY MOUTH 3 TIMES DAILY.   escitalopram (LEXAPRO) 20 MG tablet Take 1 tablet (20 mg total) by mouth daily.   folic acid (FOLVITE) 1 MG tablet Take 1 mg by mouth 2 (two) times daily.   losartan (COZAAR) 50 MG tablet TAKE 1 TABLET BY MOUTH EVERY DAY   omeprazole (PRILOSEC) 20 MG capsule TAKE 1 CAPSULE BY MOUTH 2 TIMES DAILY AS NEEDED.   Plecanatide (TRULANCE) 3 MG TABS Take 1 tablet (3 mg total) by mouth daily.   polyethylene glycol (MIRALAX / GLYCOLAX) 17 g packet Take 17 g by mouth daily as needed for mild constipation.   pramipexole (MIRAPEX) 0.5 MG tablet Take 1 tablet (0.5 mg total) by mouth 3 (three) times daily.   rosuvastatin (CRESTOR) 10 MG tablet TAKE 1 TABLET BY MOUTH ON MONDAYS,WEDNESDAYS, AND FRIDAYS   Vitamin D, Ergocalciferol,  (DRISDOL) 1.25 MG (50000 UNIT) CAPS capsule TAKE 1 CAPSULE (50,000 UNITS TOTAL) BY MOUTH EVERY 7 (SEVEN) DAYS   [DISCONTINUED] ALPRAZolam (XANAX) 0.25 MG tablet TAKE 1 TABLET BY MOUTH 2 TIMES DAILY AS NEEDED FOR ANXIETY. (Patient not taking: Reported on 09/20/2023)   [DISCONTINUED] DULoxetine (CYMBALTA) 30 MG capsule TAKE 1 CAPSULE BY MOUTH EVERY DAY (  Patient not taking: Reported on 07/26/2023)   No facility-administered encounter medications on file as of 09/20/2023.    Objective:   PHYSICAL EXAMINATION:    VITALS:   Vitals:   09/20/23 0839  BP: 126/74  Pulse: (!) 57  SpO2: 97%  Weight: 240 lb (108.9 kg)  Height: 5\' 4"  (1.626 m)   GEN:  The patient appears stated age and is in NAD. HEENT:  Normocephalic, atraumatic.  The mucous membranes are moist.  CV:  brady.  regular Lungs:  CTAB Neck:  no bruits  Neurological examination:  Orientation: The patient is alert and oriented x3. Cranial nerves: There is good facial symmetry without significant hypomimia.  EOMI.   The speech is fluent and clear. Soft palate rises symmetrically and there is no tongue deviation. Hearing is intact to conversational tone. Sensation: Sensation is intact to light touch throughout Motor: Strength is at least antigravity x4.  Movement examination: Tone: There is normal tone today Abnormal movements: mild R foot tremor>L foot tremor Coordination:  There is good RAMs today in the bilateral UE/LE.  No decremation Gait and Station: The patient has no difficulty arising out of a deep-seated chair without the use of the hands. The patients gait is slow and tenuous but she isn't shuffling.  She has slight decrease arm swing on the R.  This is same as previous  I have reviewed and interpreted the following labs independently    Chemistry      Component Value Date/Time   NA 140 03/28/2023 1005   NA 144 12/05/2019 0935   K 3.9 03/28/2023 1005   CL 105 03/28/2023 1005   CO2 25 03/28/2023 1005   BUN 17  03/28/2023 1005   BUN 13 12/05/2019 0935   CREATININE 0.64 03/28/2023 1005   CREATININE 0.71 01/04/2015 1046      Component Value Date/Time   CALCIUM 9.7 03/28/2023 1005   ALKPHOS 71 03/28/2023 1005   AST 21 03/28/2023 1005   ALT 8 03/28/2023 1005   BILITOT 0.7 03/28/2023 1005   BILITOT 0.4 11/27/2016 1123       Lab Results  Component Value Date   WBC 5.7 03/28/2023   HGB 12.6 03/28/2023   HCT 39.3 03/28/2023   MCV 91.2 03/28/2023   PLT 220.0 03/28/2023    Lab Results  Component Value Date   TSH 1.10 01/12/2022     Total time spent on today's visit was 44 minutes, including both face-to-face time and nonface-to-face time.  Time included that spent on review of records (prior notes available to me/labs/imaging if pertinent), discussing treatment and goals, answering patient's questions and coordinating care.  Cc:  Shelva Majestic, MD

## 2023-09-20 ENCOUNTER — Ambulatory Visit: Payer: Medicare Other | Admitting: Neurology

## 2023-09-20 VITALS — BP 126/74 | HR 57 | Ht 64.0 in | Wt 240.0 lb

## 2023-09-20 DIAGNOSIS — G4709 Other insomnia: Secondary | ICD-10-CM | POA: Diagnosis not present

## 2023-09-20 DIAGNOSIS — G20A1 Parkinson's disease without dyskinesia, without mention of fluctuations: Secondary | ICD-10-CM | POA: Diagnosis not present

## 2023-09-20 NOTE — Patient Instructions (Signed)
Local and Online Resources for Power over Parkinson's Group?  January 2025 ?  LOCAL [place] PARKINSON'S GROUPS??  Power over Parkinson's Group:???  Upcoming Power over [employer] Meetings/Care Partner Support:? 2nd Wednesdays of the month at 2 pm:  January 8th, February 12th Contact Lynwood Dawley at [email or web address] or Amy Marriott at [email or web address] if interested in participating in this group?  ?  LOCAL EVENTS AND NEW OFFERINGS?  Dance Project Spring 2025:  January 14-May 20, Tuesdays 10-11 am.  All details on website: [email or web address] ACT FITNESS Chair Yoga classes "Train and Gain", Fridays 10 am, [employer].  Contact Gina at [phone].   PWR! Moves [place] class!  Wednesdays at 10 am.  Please contact Lonia Blood, PT at [email or web address] if interested. Health visitor Classes offering at [employer]!? Tuesdays (Chair Yoga)  and Wednesdays (PWR! Moves)  1:00 pm.?? Contact Aldona Lento [phone] or [email or web address] Drumming for Parkinson's will be held on 2nd and 4th Mondays at 11:00 am.?? Located at the [place] of the [place] ([address] [place]. [place].) *Next class is January 13th.? Contact Albertina Parr at [email or web address] or [phone]?  [employer] Parkinson's Tai Chi Class, Mondays at 11 am.  Call [phone] for details  [facility] at [facility]- [address], [place] Wednesdays- 4:00 - 5:00 PM - specifically for Parkinson's Disease.  Free!  Contact Denny Peon, [place] - [phone] (clinic) or  [phone] (cell) or by email: [email or web address]   ?ONLINE EDUCATION AND SUPPORT?  Parkinson Foundation:? www.parkinson.org?  PD Health at Home continues:? Mindfulness Mondays, Wellness Wednesdays, Fitness Fridays??  Upcoming Education:??  Empowerment through Movement, Wednesday,  January 15th, 1-2 pm A Deep Dive into Deep Brain Stimulation (DBS), Wednesday, January 29th, 1-2 pm Expert Briefing:    Stay tuned Register for virtual education and expert briefings (webinars) at [email or web address]  Please check out their website to sign up for emails and see their full online offerings??  ?  [employer] Foundation:? www.[email or web address]??  Third Thursday Webinars:? On the third Thursday of every month at 12 p.m. ET, join our free live webinars to learn about various aspects of living with Parkinson's disease and our work to speed medical breakthroughs.?  Upcoming Webinar:? Managing the Hidden Symptoms:  Mood and Motivation Changes in Parkinson's.  Thursday, January 16th at 12 noon.  Check out additional information on their website to see their full online offerings?  ?  [employer]:? [email or web address]?  Upcoming Webinar:   Stay tuned Series:? Living with Parkinson's Meetup.?? Third Thursdays each month, 3 pm?  Care Partner Monthly Meetup.? With Jillene Bucks [place].? First Tuesday of each month, 2 pm?  Check out additional information to Live Well Today on their website?  ?  Parkinson and Movement Disorders (PMD) Alliance:? www.pmdalliance.org?  NeuroLife Online:? Online Education Events?  Sign up for emails, which are sent weekly to give you updates on programming and online offerings?  ?  Parkinson's Association of the [place]:? www.parkinsonassociation.org?  Information on online support groups, education events, and online exercises including Yoga, Parkinson's exercises and more-LOTS of information on links to PD resources and online events?  Virtual Support Group through [employer] of the [place]-First Wednesday of each month at 2 pm   MOVEMENT AND EXERCISE OPPORTUNITIES?  PWR! Moves [place] class has returned!  Wednesdays at 10 am.  Please contact Lonia Blood, PT at  [email or web address] if interested. Parkinson's Exercise Class offerings at [employer]. Tuesdays (Chair yoga) and Wednesdays (PWR! Moves)  1:00  pm.?  Contact Aldona Lento [phone] or [email or web address]  Parkinson's Wellness Recovery (PWR! Moves)? www.pwr4life.org?  Info on the PWR! Virtual Experience:? You will have access to our expertise?through self-assessment, guided plans that start with the PD-specific fundamentals, educational content, tips, Q&A with an expert, and a growing Engineering geologist of PD-specific pre-recorded and live exercise classes of varying types and intensity - both physical and cognitive! If that is not enough, we offer 1:1 wellness consultations (in-person or virtual) to personalize your PWR! Dance movement psychotherapist.??  Parkinson [employer] Fridays:??  As part of the PD Health @ Home program, this free video series focuses each week on one aspect of fitness designed to support people living with Parkinson's.? These weekly videos highlight the Parkinson Foundation fitness guidelines for people with Parkinson's disease.?  [email or web address].[email or web address]?  Dance for PD website is offering free, live-stream classes throughout the week, as well as links to [employer] of classes:? [email or web address]/?  Virtual dance and Pilates for Parkinson's classes: Click on the Community Tab> Parkinson's Movement Initiative Tab.? To register for classes and for more information, visit [email or web address] and click the "community" tab.??  [employer] Parkinson's Cycling Classes??  [facility]:? Thursdays @ Noon-Live classes at [employer] ([employer] at [email or web address]?or [phone])?  [facility] [employer] [facility]: Classes Tuesday, Wednesday and Thursday (contact [place] at [email or web address] ?or [phone])?  [employer]?  Varied levels of classes are offered Tuesdays and  Thursdays at [facility].??  Stretching with Byrd Hesselbach weekly class is also offered for people with Parkinson's?  To observe a class or for more information, call [phone] or email Patricia Nettle at [facility] .com?    ADDITIONAL SUPPORT AND RESOURCES?  [employer]:  Chiropractor:? www.well-springsolutions.org/caregiver-education/caregiver-support-group.? You may also contact Loleta Chance at [facility] -spring.org or [phone].????  Well-Spring Navigator:? Just1Navigator program, a?free service to help individuals and families through the journey of determining care for older adults.? The "Navigator" is a Child psychotherapist, Sidney Ace, who will speak with a prospective client and/or loved ones to provide an assessment of the situation and a set of recommendations for a personalized care plan -- all free of charge, and whether?[employer] offers the needed service or not. If the need is not a service we provide, we are well-connected with reputable programs in town that we can refer you to.? www.well-springsolutions.org or to speak with the Navigator, call [phone].?

## 2023-09-21 ENCOUNTER — Encounter: Payer: Self-pay | Admitting: Psychiatry

## 2023-09-21 ENCOUNTER — Other Ambulatory Visit: Payer: Self-pay

## 2023-09-21 ENCOUNTER — Telehealth (INDEPENDENT_AMBULATORY_CARE_PROVIDER_SITE_OTHER): Payer: Medicare Other | Admitting: Psychiatry

## 2023-09-21 DIAGNOSIS — F330 Major depressive disorder, recurrent, mild: Secondary | ICD-10-CM

## 2023-09-21 DIAGNOSIS — K3184 Gastroparesis: Secondary | ICD-10-CM

## 2023-09-21 DIAGNOSIS — F419 Anxiety disorder, unspecified: Secondary | ICD-10-CM

## 2023-09-21 DIAGNOSIS — I10 Essential (primary) hypertension: Secondary | ICD-10-CM

## 2023-09-21 NOTE — Patient Instructions (Signed)
Continue duloxetine 20 mg daily  Continue lexapro 20 mg daily  Next appointment: 11/17 at 9:30

## 2023-10-10 ENCOUNTER — Emergency Department (HOSPITAL_BASED_OUTPATIENT_CLINIC_OR_DEPARTMENT_OTHER)
Admission: EM | Admit: 2023-10-10 | Discharge: 2023-10-10 | Disposition: A | Discharge status: Home / Self Care | Payer: Medicare Other | Attending: Emergency Medicine | Admitting: Emergency Medicine

## 2023-10-10 ENCOUNTER — Emergency Department (HOSPITAL_BASED_OUTPATIENT_CLINIC_OR_DEPARTMENT_OTHER): Payer: Medicare Other | Admitting: Radiology

## 2023-10-10 ENCOUNTER — Other Ambulatory Visit: Payer: Self-pay

## 2023-10-10 ENCOUNTER — Encounter (HOSPITAL_BASED_OUTPATIENT_CLINIC_OR_DEPARTMENT_OTHER): Payer: Self-pay

## 2023-10-10 DIAGNOSIS — R0789 Other chest pain: Secondary | ICD-10-CM | POA: Insufficient documentation

## 2023-10-10 DIAGNOSIS — I10 Essential (primary) hypertension: Secondary | ICD-10-CM | POA: Diagnosis not present

## 2023-10-10 DIAGNOSIS — R1013 Epigastric pain: Secondary | ICD-10-CM | POA: Diagnosis not present

## 2023-10-10 DIAGNOSIS — R079 Chest pain, unspecified: Secondary | ICD-10-CM | POA: Diagnosis not present

## 2023-10-10 DIAGNOSIS — Z79899 Other long term (current) drug therapy: Secondary | ICD-10-CM | POA: Diagnosis not present

## 2023-10-10 DIAGNOSIS — M7989 Other specified soft tissue disorders: Secondary | ICD-10-CM | POA: Diagnosis not present

## 2023-10-10 DIAGNOSIS — K449 Diaphragmatic hernia without obstruction or gangrene: Secondary | ICD-10-CM | POA: Diagnosis not present

## 2023-10-10 LAB — CBC
HCT: 36.5 % (ref 36.0–46.0)
Hemoglobin: 12.1 g/dL (ref 12.0–15.0)
MCH: 30.3 pg (ref 26.0–34.0)
MCHC: 33.2 g/dL (ref 30.0–36.0)
MCV: 91.5 fL (ref 80.0–100.0)
Platelets: 201 10*3/uL (ref 150–400)
RBC: 3.99 MIL/uL (ref 3.87–5.11)
RDW: 13.6 % (ref 11.5–15.5)
WBC: 5.8 10*3/uL (ref 4.0–10.5)
nRBC: 0 % (ref 0.0–0.2)

## 2023-10-10 LAB — HEPATIC FUNCTION PANEL
ALT: 10 U/L (ref 0–44)
AST: 27 U/L (ref 15–41)
Albumin: 4.2 g/dL (ref 3.5–5.0)
Alkaline Phosphatase: 73 U/L (ref 38–126)
Bilirubin, Direct: 0.1 mg/dL (ref 0.0–0.2)
Indirect Bilirubin: 0.5 mg/dL (ref 0.3–0.9)
Total Bilirubin: 0.6 mg/dL (ref 0.0–1.2)
Total Protein: 6.9 g/dL (ref 6.5–8.1)

## 2023-10-10 LAB — BASIC METABOLIC PANEL
Anion gap: 7 (ref 5–15)
BUN: 19 mg/dL (ref 8–23)
CO2: 24 mmol/L (ref 22–32)
Calcium: 9.1 mg/dL (ref 8.9–10.3)
Chloride: 109 mmol/L (ref 98–111)
Creatinine, Ser: 0.58 mg/dL (ref 0.44–1.00)
GFR, Estimated: >60 mL/min (ref >60)
Glucose, Bld: 102 mg/dL — ABNORMAL HIGH (ref 70–99)
Potassium: 4.2 mmol/L (ref 3.5–5.1)
Sodium: 140 mmol/L (ref 135–145)

## 2023-10-10 LAB — TROPONIN I (HIGH SENSITIVITY)
Troponin I (High Sensitivity): 4 ng/L (ref <18)
Troponin I (High Sensitivity): 4 ng/L (ref <18)

## 2023-10-10 NOTE — Discharge Instructions (Signed)
 Continue to eat small meals regularly, follow-up with your primary care provider and gastroenterologist.  Although it may interact with your carbidopa  levodopa  it is worth talking to your neurologist about the possibility of taking Reglan as this can help decrease gastric emptying time can be helpful people with gastroparesis.  Please return to emergency room for any new or concerning symptoms including any coughing up of blood, fever or any other new or concerning symptoms.

## 2023-10-10 NOTE — ED Provider Notes (Signed)
 [place] EMERGENCY DEPARTMENT AT [facility] Provider Note   CSN: [id number] Arrival date & time: 10/10/23  9047     History  Chief Complaint  Patient presents with   Chest Pain    Kristin [name] is a 73 y.o. female.   Chest Pain  Patient is a 73 year old female with a past medical history significant for gastric outlet obstruction, reflux, fibroids, HLD, HTN, esophageal stricture, hiatal hernia status post surgery, rheumatoid arthritis, osteoarthritis, constipation  She presents emergency room today complaining of upper abdominal pain that also seems to radiate up into her chest and she states that she feels that she is clearing her throat and feels like she has more gas than usual.  She states that the chest pressure that she is experiencing concerned her and that prompted her to come to emergency room.  She states she has had some leg swelling for months that seems to be unchanged over the past week or so.  She states that her chest discomfort symptom began this morning.  She states that she has been pushing herself because she was recently diagnosed with gastroparesis and was trying a lot of different foods to see which foods she can tolerate well.  No recent surgeries, hospitalization, long travel, hemoptysis, estrogen containing OCP, cancer history.  No unilateral leg swelling.  No history of PE or VTE.      Home Medications Prior to Admission medications   Medication Sig Start Date End Date Taking? Authorizing Provider  carbidopa -levodopa  (SINEMET  CR) 50-200 MG tablet TAKE 1 TABLET BY MOUTH EVERYDAY AT BEDTIME 03/20/23   Tat, Asberry RAMAN, DO  carbidopa -levodopa  (SINEMET  IR) 25-100 MG tablet TAKE 2 TABLETS BY MOUTH AT 7AM, 2TABLETS AT 11AM, 1 TABLET AT 4PM 03/20/23   Tat, Rebecca S, DO  carvedilol  (COREG ) 3.125 MG tablet TAKE 1 TABLET BY MOUTH TWICE A DAY WITH FOOD 07/30/23   Katrinka Garnette KIDD, MD  DULoxetine  (CYMBALTA ) 20 MG capsule Take 1 capsule (20 mg total) by  mouth daily. 08/24/23 11/22/23  Vickey Mettle, MD  entacapone  (COMTAN ) 200 MG tablet TAKE 1 TABLET BY MOUTH 3 TIMES DAILY. 08/16/23   Tat, Asberry RAMAN, DO  escitalopram  (LEXAPRO ) 20 MG tablet Take 1 tablet (20 mg total) by mouth daily. 03/28/23   Katrinka Garnette KIDD, MD  folic acid  (FOLVITE ) 1 MG tablet Take 1 mg by mouth 2 (two) times daily. 03/21/18   [provider]  losartan  (COZAAR ) 50 MG tablet TAKE 1 TABLET BY MOUTH EVERY DAY 08/10/23   Katrinka Garnette KIDD, MD  omeprazole  (PRILOSEC) 20 MG capsule TAKE 1 CAPSULE BY MOUTH 2 TIMES DAILY AS NEEDED. 11/27/22   Katrinka Garnette KIDD, MD  Plecanatide  (TRULANCE ) 3 MG TABS Take 1 tablet (3 mg total) by mouth daily. 05/30/23   Nandigam, Kavitha V, MD  polyethylene glycol (MIRALAX  / GLYCOLAX ) 17 g packet Take 17 g by mouth daily as needed for mild constipation.    [provider]  pramipexole  (MIRAPEX ) 0.5 MG tablet Take 1 tablet (0.5 mg total) by mouth 3 (three) times daily. 03/20/23   Tat, Asberry RAMAN, DO  rosuvastatin  (CRESTOR ) 10 MG tablet TAKE 1 TABLET BY MOUTH ON MONDAYS,WEDNESDAYS, AND FRIDAYS 04/09/23   Katrinka Garnette KIDD, MD  Vitamin D , Ergocalciferol , (DRISDOL ) 1.25 MG (50000 UNIT) CAPS capsule TAKE 1 CAPSULE (50,000 UNITS TOTAL) BY MOUTH EVERY 7 (SEVEN) DAYS 08/16/23   Katrinka Garnette KIDD, MD      Allergies    Scopolamine, Prednisone , Bactrim [sulfamethoxazole-trimethoprim], Penicillins, and  Sulfa antibiotics    Review of Systems   Review of Systems  Cardiovascular:  Positive for chest pain.    Physical Exam Updated Vital Signs BP 115/62   Pulse 70   Temp 97.8 F (36.6 C) (Oral)   Resp (!) 21   Ht 5' 4 (1.626 m)   Wt 108.8 kg   SpO2 98%   BMI 41.17 kg/m  Physical Exam Vitals and nursing note reviewed.  Constitutional:      General: She is not in acute distress.    Appearance: She is obese.  HENT:     Head: Normocephalic and atraumatic.     Nose: Nose normal.     Mouth/Throat:     Mouth: Mucous membranes are moist.  Eyes:      General: No scleral icterus. Cardiovascular:     Rate and Rhythm: Normal rate and regular rhythm.     Pulses: Normal pulses.     Heart sounds: Normal heart sounds.  Pulmonary:     Effort: Pulmonary effort is normal. No respiratory distress.     Breath sounds: No wheezing.  Abdominal:     Palpations: Abdomen is soft.     Tenderness: There is no abdominal tenderness.     Comments: Abdomen soft nontender.  No guarding or rebound  Musculoskeletal:     Cervical back: Normal range of motion.     Right lower leg: No edema.     Left lower leg: No edema.  Skin:    General: Skin is warm and dry.     Capillary Refill: Capillary refill takes less than 2 seconds.  Neurological:     Mental Status: She is alert. Mental status is at baseline.  Psychiatric:        Mood and Affect: Mood normal.        Behavior: Behavior normal.     ED Results / Procedures / Treatments   Labs (all labs ordered are listed, but only abnormal results are displayed) Labs Reviewed  BASIC METABOLIC PANEL - Abnormal; Notable for the following components:      Result Value   Glucose, Bld 102 (*)    All other components within normal limits  CBC  HEPATIC FUNCTION PANEL  TROPONIN I (HIGH SENSITIVITY)  TROPONIN I (HIGH SENSITIVITY)    EKG None  Radiology DG Chest 2 View Result Date: 10/10/2023 CLINICAL DATA:  73 year old female with chest and epigastric pain. Lower extremity swelling. EXAM: CHEST - 2 VIEW COMPARISON:  Chest radiographs 10/06/2020 and earlier. FINDINGS: Moderate to large gastric hiatal hernia is no longer apparent. Other mediastinal contours are within normal limits. Visualized tracheal air column is within normal limits. Lung volumes have not significantly changed. No pneumothorax, pulmonary edema, pleural effusion or confluent lung opacity. No acute osseous abnormality identified. Negative visible bowel gas. No evidence of pneumoperitoneum under the diaphragm. IMPRESSION: No acute  cardiopulmonary abnormality identified. Query interval operative repair of gastric hiatal hernia which was large in 2022. Electronically Signed   By: VEAR Hurst M.D.   On: 10/10/2023 11:57    Procedures Procedures    Medications Ordered in ED Medications - No data to display  ED Course/ Medical Decision Making/ A&P                                 Medical Decision Making Amount and/or Complexity of Data Reviewed Labs: ordered. Radiology: ordered.   Patient is a 73 year old female  with a past medical history significant for gastric outlet obstruction, reflux, fibroids, HLD, HTN, esophageal stricture, hiatal hernia status post surgery, rheumatoid arthritis, osteoarthritis, constipation  She presents emergency room today complaining of upper abdominal pain that also seems to radiate up into her chest and she states that she feels that she is clearing her throat and feels like she has more gas than usual.  She states that the chest pressure that she is experiencing concerned her and that prompted her to come to emergency room.  She states she has had some leg swelling for months that seems to be unchanged over the past week or so.  She states that her chest discomfort symptom began this morning.  She states that she has been pushing herself because she was recently diagnosed with gastroparesis and was trying a lot of different foods to see which foods she can tolerate well.  No recent surgeries, hospitalization, long travel, hemoptysis, estrogen containing OCP, cancer history.  No unilateral leg swelling.  No history of PE or VTE.   The emergent causes of chest pain include: Acute coronary syndrome, tamponade, pericarditis/myocarditis, aortic dissection, pulmonary embolism, tension pneumothorax, pneumonia, and esophageal rupture.  I do not believe the patient has an emergent cause of chest pain, other urgent/non-acute considerations include, but are not limited to: chronic angina, aortic  stenosis, cardiomyopathy, mitral valve prolapse, pulmonary hypertension, aortic insufficiency, right ventricular hypertrophy, pleuritis, bronchitis, pneumothorax, tumor, gastroesophageal reflux disease (GERD), esophageal spasm, Mallory-Weiss syndrome, peptic ulcer disease, pancreatitis, functional gastrointestinal pain, cervical or thoracic disk disease or arthritis, shoulder arthritis, costochondritis, subacromial bursitis, anxiety or panic attack, herpes zoster, breast disorders, chest wall tumors, thoracic outlet syndrome, mediastinitis.   She is feeling significantly improved on my reevaluation prior to discharge.  She has been observed here in the emergency room for 4 hours.  CBC BMP LFTs troponin all reassuring chest x-ray unremarkable and EKG without ischemia.  Will discharge home at this time.  She feels well she is ambulating I have a low suspicion of her symptoms being cardiac in nature.  Recommend follow-up with her gastroenterologist.  She also has Parkinson's and apparently Reglan is contraindicated in the setting of carbidopa  levodopa  use however I recommended that she talk to her neurologist about this as it could be helpful in decreasing her gastroparesis symptoms.   Final Clinical Impression(s) / ED Diagnoses Final diagnoses:  Atypical chest pain    Rx / DC Orders ED Discharge Orders     None         Neldon Hamp RAMAN, [place] 10/11/23 1711    Tonia Chew, MD 10/14/23 [phone]

## 2023-10-10 NOTE — ED Triage Notes (Signed)
 Epigastric pain radiating into throat.  Bilateral leg swelling. Denies short of breath.  Onset this am

## 2023-10-18 DIAGNOSIS — Z6841 Body Mass Index (BMI) 40.0 and over, adult: Secondary | ICD-10-CM | POA: Diagnosis not present

## 2023-10-18 DIAGNOSIS — I2510 Atherosclerotic heart disease of native coronary artery without angina pectoris: Secondary | ICD-10-CM | POA: Diagnosis not present

## 2023-10-18 DIAGNOSIS — E8889 Other specified metabolic disorders: Secondary | ICD-10-CM | POA: Diagnosis not present

## 2023-10-18 DIAGNOSIS — F418 Other specified anxiety disorders: Secondary | ICD-10-CM | POA: Diagnosis not present

## 2023-10-18 DIAGNOSIS — I10 Essential (primary) hypertension: Secondary | ICD-10-CM | POA: Diagnosis not present

## 2023-10-18 DIAGNOSIS — E66813 Obesity, class 3: Secondary | ICD-10-CM | POA: Diagnosis not present

## 2023-10-18 DIAGNOSIS — G20A1 Parkinson's disease without dyskinesia, without mention of fluctuations: Secondary | ICD-10-CM | POA: Diagnosis not present

## 2023-10-18 DIAGNOSIS — R7303 Prediabetes: Secondary | ICD-10-CM | POA: Diagnosis not present

## 2023-10-18 DIAGNOSIS — E559 Vitamin D deficiency, unspecified: Secondary | ICD-10-CM | POA: Diagnosis not present

## 2023-10-18 DIAGNOSIS — E538 Deficiency of other specified B group vitamins: Secondary | ICD-10-CM | POA: Diagnosis not present

## 2023-10-18 DIAGNOSIS — E785 Hyperlipidemia, unspecified: Secondary | ICD-10-CM | POA: Diagnosis not present

## 2023-10-26 ENCOUNTER — Other Ambulatory Visit: Payer: Self-pay | Admitting: Gastroenterology

## 2023-10-26 ENCOUNTER — Other Ambulatory Visit: Payer: Self-pay | Admitting: Neurology

## 2023-10-26 DIAGNOSIS — G20B1 Parkinson's disease with dyskinesia, without mention of fluctuations: Secondary | ICD-10-CM

## 2023-10-28 NOTE — Progress Notes (Unsigned)
 Virtual Visit via Video Note  I connected with Kristin [name] on 11/02/23 at 10:00 AM EST by a video enabled telemedicine application and verified that I am speaking with the correct person using two identifiers.  Location: Patient: home Provider: office  Persons participated in the visit- patient, provider    I discussed the limitations of evaluation and management by telemedicine and the availability of in person appointments. The patient expressed understanding and agreed to proceed.    I discussed the assessment and treatment plan with the patient. The patient was provided an opportunity to ask questions and all were answered. The patient agreed with the plan and demonstrated an understanding of the instructions.   The patient was advised to call back or seek an in-person evaluation if the symptoms worsen or if the condition fails to improve as anticipated.   Neysa Hotter, MD    [facility] MD/PA/NP OP Progress Note  11/02/2023 10:40 AM Kristin [name]  MRN:  [id number]  Chief Complaint:  Chief Complaint  Patient presents with   Follow-up   HPI:  - since the last visit, she went to ED due to abdominal pain.  This is a follow-up appointment for depression, anxiety.  She states that she has started to use walker, which has been going good.  She is planning to do rehab in April.  She has been going to [facility].  She has been advised to eat certain diet, concerning her gastroparesis.  She feels hopeful about this.  She is willing to discuss about this with Dr. Durene Cal.  She enjoys seeing her friends every week.  She attends two book clubs.  She enjoys being around with people.  She also enjoys seeing her grandson in [place].  She feels 6 hours and feels refreshed. She has chronic fatigue.  She has been able to control the way she eats, which has helped for fear of choking.  Although she may feel down at times, she feels good today.  She has minimal anxiety.  She denies SI.   She agrees with the plan as outlined below.   Visit Diagnosis:    ICD-10-CM   1. MDD (major depressive disorder), recurrent, in partial remission (HCC)  F33.41     2. Anxiety disorder, unspecified type  F41.9     3. Chronic fatigue  R53.82       Past Psychiatric History: Please see initial evaluation for full details. I have reviewed the history. No updates at this time.     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   Hyperlipidemia    Hypertension    Infertility, female    Low back pain 10/17/2007   Qualifier: Diagnosis of  By: Lovell Sheehan MD, [name]    Measles as a child   Obesity  Osteoarthritis    Parkinson disease (HCC)    Plantar fasciitis of left foot 06/19/2012   Preventative health care 12/19/2015   Rheumatoid arthritis (HCC) 01/08/2018   Rosacea 10/05/2014   Swallowing difficulty    Urinary frequency 02/25/2013   Visual floaters 05/11/2014    Past Surgical History:  Procedure Laterality Date   ABDOMINAL HYSTERECTOMY  2006   total   esophageal     stretching   HERNIA REPAIR N/A 2022   hiatal hernia   laporoscopy     LEFT HEART CATH AND CORONARY ANGIOGRAPHY N/A 06/02/2019   Procedure: LEFT HEART CATH AND CORONARY ANGIOGRAPHY;  Surgeon: Corky Crafts, MD;  Location: [facility] INVASIVE CV LAB;  Service: Cardiovascular;  Laterality: N/A;   TONSILLECTOMY     TOTAL HIP ARTHROPLASTY Right  2010   UPPER GASTROINTESTINAL ENDOSCOPY     wisdom teeth extracted      Family Psychiatric History: Please see initial evaluation for full details. I have reviewed the history. No updates at this time.     Family History:  Family History  Problem Relation Age of Onset   Other Mother        arrythmia   Mental illness Mother        bipolar   Hyperlipidemia Mother    Thyroid disease Mother    Depression Mother    Bipolar disorder Mother    Breast cancer Mother 35   Heart disease Father    Arthritis Father        rheumatoid   Hypertension Father    Hyperlipidemia Father    Depression Sister    Mental illness Sister        bipolar   Parkinson's disease Sister    Arthritis Sister    Arthritis Sister    Arthritis Sister    Breast cancer Maternal Aunt 35   Breast cancer Maternal Uncle        25s   Heart attack Paternal Uncle    Heart attack Paternal Grandfather    Breast cancer Cousin 46   Colon cancer Neg Hx    Esophageal cancer Neg Hx    Rectal cancer Neg Hx    Stomach cancer Neg Hx    Sleep apnea Neg Hx     Social History:  Social History   Socioeconomic History   Marital status: Married    Spouse name: Not on file   Number of children: 1   Years of education: Not on file   Highest education level: Master's degree (e.g., MA, MS, MEng, MEd, MSW, MBA)  Occupational History   Occupation: Retired International aid/development worker  Tobacco Use   Smoking status: Never   Smokeless tobacco: Never  Vaping Use   Vaping status: Never Used  Substance and Sexual Activity   Alcohol use: Yes    Alcohol/week: 1.0 standard drink of alcohol    Types: 1 Glasses of wine per week    Comment: occasional wine   Drug use: No   Sexual activity: Not Currently    Partners: Male  Other Topics Concern   Not on file  Social History Narrative   Lives with husband, 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 3 grandsons- 2 in [place] and 29 year old in [place] in 2023      Hobbies: book club, time with friends, time with couples friends, plays pianos   Social Drivers of  Health   Financial Resource Strain: Low Risk  (05/06/2023)   Overall Financial Resource Strain (CARDIA)    Difficulty of Paying Living Expenses: Not hard at all  Food Insecurity: No Food Insecurity (05/06/2023)   Hunger Vital Sign    Worried About Running Out of Food in the Last Year: Never true    Ran Out of Food in the Last Year: Never true  Transportation Needs: No Transportation Needs (05/06/2023)   PRAPARE - Administrator, Civil Service (Medical): No    Lack of Transportation (Non-Medical): No  Physical Activity: Insufficiently Active (05/06/2023)   Exercise Vital Sign    Days of Exercise per Week: 4 days    Minutes of Exercise per Session: 30 min  Stress: No Stress Concern Present (05/06/2023)   [employer] of Occupational Health - Occupational Stress Questionnaire    Feeling of Stress : Only a little  Recent Concern: Stress - Stress Concern Present (03/05/2023)   [employer] of Occupational Health - Occupational Stress Questionnaire    Feeling of Stress : To some extent  Social Connections: Socially Integrated (05/06/2023)   Social Connection and Isolation Panel [NHANES]    Frequency of Communication with Friends and Family: More than three times a week    Frequency of Social Gatherings with Friends and Family: Twice a week    Attends Religious Services: More than 4 times per year    Active Member of [employer] or Organizations: Yes    Attends Banker Meetings: 1 to 4 times per year    Marital Status: Married    Allergies:  Allergies  Allergen Reactions   Scopolamine Other (See Comments)    Panic hallu  Other Reaction(s): Hallucination, Not available   Prednisone Dermatitis    Facial ulcers  Other Reaction(s): facial swelling   Bactrim [Sulfamethoxazole-Trimethoprim] Other  (See Comments)    Oral ulcers and rash   Penicillins Hives and Other (See Comments)    Did it involve swelling of the face/tongue/throat, SOB, or low BP? No  Did it involve sudden or severe rash/hives, skin peeling, or any reaction on the inside of your mouth or nose? No  Did you need to seek medical attention at a hospital or doctor's office? No  When did it last happen?      30+ years ago  If all above answers are "NO", may proceed with cephalosporin use.   Sulfa Antibiotics     Other Reaction(s): Not available    Metabolic Disorder Labs: Lab Results  Component Value Date   HGBA1C 6.0 03/28/2023   MPG 126 (H) 12/29/2013   No results found for: "PROLACTIN" Lab Results  Component Value Date   CHOL 147 03/28/2023   TRIG 114.0 03/28/2023   HDL 63.80 03/28/2023   CHOLHDL 2 03/28/2023   VLDL 22.8 03/28/2023   LDLCALC 60 03/28/2023   LDLCALC 58 01/12/2022   Lab Results  Component Value Date   TSH 1.10 01/12/2022   TSH 1.20 10/03/2021    Therapeutic Level Labs: No results found for: "LITHIUM" No results found for: "VALPROATE" No results found for: "CBMZ"  Current Medications: Current Outpatient Medications  Medication Sig Dispense Refill   carbidopa-levodopa (SINEMET CR) 50-200 MG tablet TAKE 1 TABLET BY MOUTH EVERYDAY AT BEDTIME 90 tablet 1   carbidopa-levodopa (SINEMET IR) 25-100 MG tablet TAKE 2 TABLETS BY MOUTH AT 7AM, 2TABLETS AT 11AM, 1 TABLET AT 4PM 450 tablet 2   carvedilol (COREG) 3.125 MG tablet TAKE 1 TABLET  BY MOUTH TWICE A DAY WITH FOOD 180 tablet 3   [START ON 11/22/2023] DULoxetine (CYMBALTA) 20 MG capsule Take 1 capsule (20 mg total) by mouth daily. 90 capsule 0   entacapone (COMTAN) 200 MG tablet TAKE 1 TABLET BY MOUTH THREE TIMES A DAY 270 tablet 0   escitalopram (LEXAPRO) 20 MG tablet Take 1 tablet (20 mg total) by mouth daily. 90 tablet 3   folic acid (FOLVITE) 1 MG tablet Take 1 mg by mouth 2 (two) times daily.     losartan (COZAAR) 50 MG tablet  TAKE 1 TABLET BY MOUTH EVERY DAY 90 tablet 3   omeprazole (PRILOSEC) 20 MG capsule TAKE 1 CAPSULE BY MOUTH 2 TIMES DAILY AS NEEDED. 180 capsule 1   Plecanatide (TRULANCE) 3 MG TABS Take 1 tablet (3 mg total) by mouth daily. 30 tablet 3   polyethylene glycol (MIRALAX / GLYCOLAX) 17 g packet Take 17 g by mouth daily as needed for mild constipation.     pramipexole (MIRAPEX) 0.5 MG tablet Take 1 tablet (0.5 mg total) by mouth 3 (three) times daily. 270 tablet 1   rosuvastatin (CRESTOR) 10 MG tablet TAKE 1 TABLET BY MOUTH ON MONDAYS,WEDNESDAYS, AND FRIDAYS 39 tablet 3   Vitamin D, Ergocalciferol, (DRISDOL) 1.25 MG (50000 UNIT) CAPS capsule TAKE 1 CAPSULE (50,000 UNITS TOTAL) BY MOUTH EVERY 7 (SEVEN) DAYS 12 capsule 1   No current facility-administered medications for this visit.     Musculoskeletal: Strength & Muscle Tone:  N/A Gait & Station:  N/A Patient leans: N/A  Psychiatric Specialty Exam: Review of Systems  Psychiatric/Behavioral:  Positive for dysphoric mood and sleep disturbance. Negative for agitation, behavioral problems, confusion, decreased concentration, hallucinations, self-injury and suicidal ideas. The patient is nervous/anxious. The patient is not hyperactive.   All other systems reviewed and are negative.   There were no vitals taken for this visit.There is no height or weight on file to calculate BMI.  General Appearance: Well Groomed  Eye Contact:  Good  Speech:  Clear and Coherent  Volume:  Normal  Mood:   good  Affect:  Appropriate, Congruent, and Full Range  Thought Process:  Coherent  Orientation:  Full (Time, Place, and Person)  Thought Content: Logical   Suicidal Thoughts:  No  Homicidal Thoughts:  No  Memory:  Immediate;   Good  Judgement:  Good  Insight:  Good  Psychomotor Activity:  Normal  Concentration:  Concentration: Good and Attention Span: Good  Recall:  Good  Fund of Knowledge: Good  Language: Good  Akathisia:  No  Handed:  Right  AIMS (if  indicated): not done  Assets:  Communication Skills Desire for Improvement  ADL's:  Intact  Cognition: WNL  Sleep:  Fair   Screenings: GAD-7    Garment/textile technologist Visit from 05/09/2023 in [facility] [employer] [facility] from 03/28/2023 in [facility] [employer] [facility] from 01/23/2023 in [facility]  Total GAD-7 Score 5 6 0      Mini-Mental    Flowsheet Row Office Visit from 10/15/2017 in [facility]  Total Score (max 30 points ) 30      PHQ2-9    Flowsheet Row Office Visit from 05/09/2023 in [facility] [employer] [facility] from 03/28/2023 in [facility] Clinical Support from 03/06/2023 in [facility] from 01/23/2023 in [facility] [employer] Visit from 09/27/2022 in [facility]  PHQ-2 Total Score 0 4 0 0 0  PHQ-9 Total Score 9 15 0 0 0      Flowsheet Row ED from 10/10/2023 in [facility] at [facility] ED from 03[facility]22/2023 in [facility] at [facility] from 12/10/2020 in [facility] Department at [facility]  C-SSRS RISK CATEGORY No Risk No Risk No Risk        Assessment and Plan:  Kristin [name] is a 73 y.o. year old female with a history of depression, anxiety, parkinson, seronegative, Nonerosive RA in remission,  hypertension, vitamin B 12/D deficiency, gastroparesis, who presents for the follow-up appointment for below.   1. MDD (major depressive disorder), recurrent episode, in partial remission (HCC) 2. Anxiety disorder, unspecified type Acute stressors include: fear of falling secondary to Parkinson since 2015 Other stressors include:    History: suffers from depression for  many years, Originally on duloxetine 30 mg daily, lexapro 20 mg daily. Relapse in mood symptoms after trying monotherapy   Exam is notable for brighter affect, and there has been overall improvement in her depressive symptoms and anxiety since the last visit.  She remains engaged in socialization with her friends, and started to go to wellness program.  Her fear of choking has subsided under their guidance as well.  While there is a concern of polypharmacy of taking both SSRI/SNRI, we will plan to continue current medication regimen for now given she denies any side effect.  Will continue Lexapro and duloxetine for now to target depression and anxiety.  Will plan to slowly decrease duloxetine in the future given she had a relapse while tapering off Lexapro.  She is aware of the risk of serotonin syndrome, QTc prolongation.   3. Chronic fatigue She experiences chronic fatigue.  Although it is multifactorial, which includes parkinson disease and medication induced/carvedilol, she may benefit from checking ferritin level to rule out iron deficiency given limited PO intake due to gastroparesis.  She would like to discuss this with her primary care, Dr. Durene Cal.    Plan Continue duloxetine 20 mg daily - reduced 07/2023 Continue lexapro 20 mg daily (EKG HR 72, QTc 448 msec, 01/2024) Next appointment: 4/23 at 11:30 She sees a therapist, Etheleen Nicks-  [facility], every two weeks   The patient demonstrates the following risk factors for suicide: Chronic risk factors for suicide include: psychiatric disorder of depression, anxiety . Acute risk factors for suicide include: loss (financial, interpersonal, professional). Protective factors for this patient include: positive social support, responsibility to others (children, family), coping skills, and hope for the future. Considering these factors, the overall suicide risk at this point appears to be low. Patient is appropriate for outpatient follow  up.     Collaboration of Care: Collaboration of Care: Other reviewed notes in Epic  Patient/Guardian was advised Release of Information must be obtained prior to any record release in order to collaborate their care with an outside provider. Patient/Guardian was advised if they have not already done so to contact the registration department to sign all necessary forms in order for [place] to release information regarding their care.   Consent: Patient/Guardian gives verbal consent for treatment and assignment of benefits for services provided during this visit. Patient/Guardian expressed understanding and agreed to  proceed.    Neysa Hotter, MD 11/02/2023, 10:40 AM

## 2023-11-01 DIAGNOSIS — I10 Essential (primary) hypertension: Secondary | ICD-10-CM | POA: Diagnosis not present

## 2023-11-01 DIAGNOSIS — E559 Vitamin D deficiency, unspecified: Secondary | ICD-10-CM | POA: Diagnosis not present

## 2023-11-01 DIAGNOSIS — E66813 Obesity, class 3: Secondary | ICD-10-CM | POA: Diagnosis not present

## 2023-11-01 DIAGNOSIS — Z6841 Body Mass Index (BMI) 40.0 and over, adult: Secondary | ICD-10-CM | POA: Diagnosis not present

## 2023-11-01 DIAGNOSIS — R7303 Prediabetes: Secondary | ICD-10-CM | POA: Diagnosis not present

## 2023-11-01 DIAGNOSIS — I2510 Atherosclerotic heart disease of native coronary artery without angina pectoris: Secondary | ICD-10-CM | POA: Diagnosis not present

## 2023-11-01 DIAGNOSIS — E538 Deficiency of other specified B group vitamins: Secondary | ICD-10-CM | POA: Diagnosis not present

## 2023-11-01 DIAGNOSIS — K3184 Gastroparesis: Secondary | ICD-10-CM | POA: Diagnosis not present

## 2023-11-02 ENCOUNTER — Telehealth: Payer: Medicare Other | Admitting: Psychiatry

## 2023-11-02 ENCOUNTER — Encounter: Payer: Self-pay | Admitting: Psychiatry

## 2023-11-02 DIAGNOSIS — F3341 Major depressive disorder, recurrent, in partial remission: Secondary | ICD-10-CM | POA: Diagnosis not present

## 2023-11-02 DIAGNOSIS — F419 Anxiety disorder, unspecified: Secondary | ICD-10-CM

## 2023-11-02 DIAGNOSIS — R5382 Chronic fatigue, unspecified: Secondary | ICD-10-CM

## 2023-11-02 MED ORDER — DULOXETINE HCL 20 MG PO CPEP
20.0000 mg | ORAL_CAPSULE | Freq: Every day | ORAL | 0 refills | Status: AC
Start: 2023-11-22 — End: 2024-02-20

## 2023-11-02 NOTE — Patient Instructions (Addendum)
 Continue duloxetine 20 mg daily Continue lexapro 20 mg daily  Next appointment: 4/23 at 11:30

## 2023-11-06 ENCOUNTER — Ambulatory Visit (INDEPENDENT_AMBULATORY_CARE_PROVIDER_SITE_OTHER): Payer: Medicare Other | Admitting: Family Medicine

## 2023-11-06 VITALS — BP 129/82 | HR 71 | Temp 97.9°F | Ht 64.0 in | Wt 237.2 lb

## 2023-11-06 DIAGNOSIS — Z6841 Body Mass Index (BMI) 40.0 and over, adult: Secondary | ICD-10-CM | POA: Diagnosis not present

## 2023-11-06 DIAGNOSIS — I10 Essential (primary) hypertension: Secondary | ICD-10-CM | POA: Diagnosis not present

## 2023-11-06 DIAGNOSIS — M069 Rheumatoid arthritis, unspecified: Secondary | ICD-10-CM | POA: Diagnosis not present

## 2023-11-06 DIAGNOSIS — E782 Mixed hyperlipidemia: Secondary | ICD-10-CM | POA: Diagnosis not present

## 2023-11-06 DIAGNOSIS — I2510 Atherosclerotic heart disease of native coronary artery without angina pectoris: Secondary | ICD-10-CM | POA: Diagnosis not present

## 2023-11-06 DIAGNOSIS — G20B2 Parkinson's disease with dyskinesia, with fluctuations: Secondary | ICD-10-CM | POA: Diagnosis not present

## 2023-11-06 NOTE — Patient Instructions (Addendum)
 Hold off on labs since recently done  Glad you are doing reasonably well- hopefully can figure out the diet/constipation issues- finding the best possible solution  Recommended follow up: Return in about 6 months (around 05/08/2024) for followup or sooner if needed.Schedule b4 you leave.

## 2023-11-06 NOTE — Progress Notes (Signed)
 Phone [phone] In person visit   Subjective:   Kristin [name] is a 72 y.o. year old very pleasant female patient who presents for/with See problem oriented charting Chief Complaint  Patient presents with   Follow-up    Pt here for 6 month follow-up    Depression   Hypertension   Hyperlipidemia   Leg Swelling    Pt states that she has leg swelling in both legs, worse in the evenings    Past Medical History-  Patient Active Problem List   Diagnosis Date Noted   Coronary artery disease involving native coronary artery of native heart without angina pectoris 06/12/2019    Priority: High   Rheumatoid arthritis (HCC) 01/08/2018    Priority: High   Constipation 12/07/2015    Priority: High   Parkinson's disease (HCC) 05/11/2014    Priority: High   Vitamin B 12 deficiency 05/30/2021    Priority: Medium    Vitamin D deficiency 12/20/2016    Priority: Medium    Prediabetes 12/20/2016    Priority: Medium    Shortness of breath on exertion 11/27/2016    Priority: Medium    Morbid obesity (HCC)     Priority: Medium    Depression with anxiety 11/02/2009    Priority: Medium    Essential hypertension 07/05/2007    Priority: Medium    Osteoarthritis 07/05/2007    Priority: Medium    Hyperlipidemia 06/26/2007    Priority: Medium    H/O: iron deficiency anemia 05/08/2007    Priority: Medium    Hiatal hernia with GERD 05/08/2007    Priority: Medium    Anemia 03/16/2020    Priority: Low   Diastolic dysfunction 05/16/2019    Priority: Low   BCC (basal cell carcinoma of skin) 10/05/2014    Priority: Low   Rosacea 10/05/2014    Priority: Low   Screening for cervical cancer 07/17/2012    Priority: Low   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   Foot pain, bilateral 06/19/2012    Priority: 1.   Lumbar back pain with radiculopathy affecting lower extremity  10/17/2007    Priority: 1.    Medications- reviewed and updated Current Outpatient Medications  Medication Sig Dispense Refill   carbidopa-levodopa (SINEMET CR) 50-200 MG tablet TAKE 1 TABLET BY MOUTH EVERYDAY AT BEDTIME 90 tablet 1   carbidopa-levodopa (SINEMET IR) 25-100 MG tablet TAKE 2 TABLETS BY MOUTH AT 7AM, 2TABLETS AT 11AM, 1 TABLET AT 4PM 450 tablet 2   carvedilol (COREG) 3.125 MG tablet TAKE 1 TABLET BY MOUTH TWICE A DAY WITH FOOD 180 tablet 3   [START ON 11/22/2023] DULoxetine (CYMBALTA) 20 MG capsule Take 1 capsule (20 mg total) by mouth daily. 90 capsule 0   entacapone (COMTAN) 200 MG tablet TAKE 1 TABLET BY MOUTH THREE TIMES A DAY 270 tablet 0   escitalopram (LEXAPRO) 20 MG tablet Take 1 tablet (20 mg total) by mouth daily. 90 tablet 3   losartan (COZAAR) 50 MG tablet TAKE 1 TABLET BY MOUTH EVERY DAY 90 tablet 3   omeprazole (PRILOSEC) 20 MG capsule TAKE 1 CAPSULE BY MOUTH 2 TIMES DAILY AS NEEDED. 180 capsule 1   Plecanatide (TRULANCE) 3 MG TABS Take 1 tablet (3 mg total) by mouth daily. 30 tablet 3   polyethylene glycol (MIRALAX / GLYCOLAX) 17 g packet Take 17 g by mouth  daily as needed for mild constipation.     pramipexole (MIRAPEX) 0.5 MG tablet Take 1 tablet (0.5 mg total) by mouth 3 (three) times daily. 270 tablet 1   rosuvastatin (CRESTOR) 10 MG tablet TAKE 1 TABLET BY MOUTH ON MONDAYS,WEDNESDAYS, AND FRIDAYS 39 tablet 3   Vitamin D, Ergocalciferol, (DRISDOL) 1.25 MG (50000 UNIT) CAPS capsule TAKE 1 CAPSULE (50,000 UNITS TOTAL) BY MOUTH EVERY 7 (SEVEN) DAYS 12 capsule 1   No current facility-administered medications for this visit.     Objective:  BP 129/82   Pulse 71   Temp 97.9 F (36.6 C)   Ht 5\' 4"  (1.626 m)   Wt 237 lb 3.2 oz (107.6 kg)   SpO2 92%   BMI 40.72 kg/m  Gen: NAD, resting comfortably CV: RRR no murmurs rubs or gallops Lungs: CTAB no crackles, wheeze, rhonchi  Ext: trace to 1+ edema- advised compression stockings Skin: warm, dry      Assessment and Plan   #Parkinson's disease with RLS-Follows with Dr. Arbutus Leas  S: Medication: Carbidopa levodopa (Sinemet dose sustained-release and instant release).  -Also on entacapone to avoid wearing off of medication. Some dizziness but better despite this medicine Also on mirapex 0.5 mg TID (increased ocd tendencies and shoping so trying to cut back) A/P: Overall stable-continue current medication  #Rheumatoid arthritis S: Medication: Methotrexate injections in the past- now off A/P: currently off medicine and has upcoming rheumatology visit- not having symptoms today   #hypertension S: medication: Losartan 50 mg, coreg 3.125 mg BID A/P: stable- continue current medicines    #CAD - minimal plaque on cath 2020 #hyperlipidemia S: Medication:Rosuvastatin 3 days a week, coreg 3.125 mg twice daily -shortness of breath not noted. Did have pain in chest a month ago without recurrence and suspects reflux Lab Results  Component Value Date   CHOL 147 03/28/2023   HDL 63.80 03/28/2023   LDLCALC 60 03/28/2023   LDLDIRECT 161.5 10/07/2008   TRIG 114.0 03/28/2023   CHOLHDL 2 03/28/2023  A/P: lipids well controlled continue current medications- at goal even for plaque on catheterization though minimal Coronary artery disease asymptomatic continue current medications    # Depression with anxiety- family history and mother bipolar as well- working with psychiatry in 2025 recommended by Dr. Arbutus Leas S: Medication:Lexapro 20 mg, Cymbalta 30 mg--> 20 mg with psychiatry and working with therapist- finds helpful -reports had sleep study and reassuring    11/06/2023   10:26 AM 11/06/2023   10:17 AM 05/09/2023    9:28 AM  Depression screen PHQ 2/9  Decreased Interest 0 0 0  Down, Depressed, Hopeless 0 0 0  PHQ - 2 Score 0 0 0  Altered sleeping 1  3  Tired, decreased energy 2  1  Change in appetite 2  2  Feeling bad or failure about yourself  0  1  Trouble concentrating 0  0  Moving slowly or  fidgety/restless 3  2  Suicidal thoughts 0  0  PHQ-9 Score 8  9  Difficult doing work/chores Somewhat difficult  Somewhat difficult  A/P: depression and anxiety- overall stable despite reduced dose- partial remisison- Chronic Care Management (CCM) e  # GI concerns- gastroparesis noted- trying to figure out diet between this and weight loss and constipation- not currently on anything other than miralax but trying to get back into gastroenterology .   # GERD S:Medication:  omeprazole 20 mg A/P:  did have atypical chest pain visit in early february - she thinks related to  reflux but generally does well- thought unlikely heart based on workup  # B12 deficiency S: Current treatment/medication (oral vs. IM): on B12 now  A/P: likely check next labs   # Hyperglycemia/insulin resistance/prediabetes- a1c 6.0 in 2023- stable with wellness was at 6 recently- was 6 last year- hold off on checking today - was told to consider metformin- she may start this- will watch uot for gastrointestinal side effects   #Overactive bladder- wants to schedule appointment with urology- not on medications right now. Has done pelvic floor physical therapy and has visit tomorrow  Recommended follow up: Return in about 6 months (around 05/08/2024) for followup or sooner if needed.Schedule b4 you leave. Future Appointments  Date Time Provider Department Center  11/09/2023  3:00 PM Unk Lightning, [place] [facility]  12/18/2023 11:45 AM Dillard Essex, OT [facility]  12/18/2023 12:00 PM Barron Alvine CCC-SLP [facility]  12/20/2023  8:00 AM Gean Maidens, PT [facility]  12/26/2023 11:30 AM Neysa Hotter, MD [facility] None  03/13/2024 10:45 AM [facility] ANNUAL WELLNESS VISIT 1 [facility]  03/19/2024  8:15 AM Tat, Octaviano Batty, DO [facility] None    Lab/Order associations:   ICD-10-CM   1. Coronary artery disease involving native coronary artery of native heart without angina pectoris  I25.10     2.  Rheumatoid arthritis, involving unspecified site, unspecified whether rheumatoid factor present (HCC) Chronic M06.9     3. Severe obesity (BMI >= 40) (HCC) Chronic E66.01     4. Mixed hyperlipidemia  E78.2     5. Parkinson's disease with dyskinesia and fluctuating manifestations (HCC)  G20.B2     6. Essential hypertension  I10       No orders of the defined types were placed in this encounter.   Return precautions advised.  Tana Conch, MD

## 2023-11-07 DIAGNOSIS — M6281 Muscle weakness (generalized): Secondary | ICD-10-CM | POA: Diagnosis not present

## 2023-11-07 DIAGNOSIS — M6289 Other specified disorders of muscle: Secondary | ICD-10-CM | POA: Diagnosis not present

## 2023-11-07 DIAGNOSIS — N3941 Urge incontinence: Secondary | ICD-10-CM | POA: Diagnosis not present

## 2023-11-07 DIAGNOSIS — M62838 Other muscle spasm: Secondary | ICD-10-CM | POA: Diagnosis not present

## 2023-11-08 ENCOUNTER — Other Ambulatory Visit: Payer: Self-pay | Admitting: Neurology

## 2023-11-08 DIAGNOSIS — G20A1 Parkinson's disease without dyskinesia, without mention of fluctuations: Secondary | ICD-10-CM

## 2023-11-09 ENCOUNTER — Ambulatory Visit: Payer: Medicare Other | Admitting: Physician Assistant

## 2023-11-15 DIAGNOSIS — E538 Deficiency of other specified B group vitamins: Secondary | ICD-10-CM | POA: Diagnosis not present

## 2023-11-15 DIAGNOSIS — R7303 Prediabetes: Secondary | ICD-10-CM | POA: Diagnosis not present

## 2023-11-15 DIAGNOSIS — Z6841 Body Mass Index (BMI) 40.0 and over, adult: Secondary | ICD-10-CM | POA: Diagnosis not present

## 2023-11-15 DIAGNOSIS — K5909 Other constipation: Secondary | ICD-10-CM | POA: Diagnosis not present

## 2023-11-15 DIAGNOSIS — I10 Essential (primary) hypertension: Secondary | ICD-10-CM | POA: Diagnosis not present

## 2023-11-15 DIAGNOSIS — K3184 Gastroparesis: Secondary | ICD-10-CM | POA: Diagnosis not present

## 2023-11-15 DIAGNOSIS — E66813 Obesity, class 3: Secondary | ICD-10-CM | POA: Diagnosis not present

## 2023-11-27 ENCOUNTER — Ambulatory Visit: Admitting: Physician Assistant

## 2023-11-29 ENCOUNTER — Other Ambulatory Visit: Payer: Self-pay

## 2023-11-29 ENCOUNTER — Telehealth: Payer: Self-pay | Admitting: Physical Therapy

## 2023-11-29 DIAGNOSIS — I10 Essential (primary) hypertension: Secondary | ICD-10-CM | POA: Diagnosis not present

## 2023-11-29 DIAGNOSIS — Z6841 Body Mass Index (BMI) 40.0 and over, adult: Secondary | ICD-10-CM | POA: Diagnosis not present

## 2023-11-29 DIAGNOSIS — M62838 Other muscle spasm: Secondary | ICD-10-CM | POA: Diagnosis not present

## 2023-11-29 DIAGNOSIS — K3184 Gastroparesis: Secondary | ICD-10-CM | POA: Diagnosis not present

## 2023-11-29 DIAGNOSIS — N3941 Urge incontinence: Secondary | ICD-10-CM | POA: Diagnosis not present

## 2023-11-29 DIAGNOSIS — E65 Localized adiposity: Secondary | ICD-10-CM | POA: Diagnosis not present

## 2023-11-29 DIAGNOSIS — K5900 Constipation, unspecified: Secondary | ICD-10-CM | POA: Diagnosis not present

## 2023-11-29 DIAGNOSIS — E66813 Obesity, class 3: Secondary | ICD-10-CM | POA: Diagnosis not present

## 2023-11-29 DIAGNOSIS — M6289 Other specified disorders of muscle: Secondary | ICD-10-CM | POA: Diagnosis not present

## 2023-11-29 DIAGNOSIS — G20A1 Parkinson's disease without dyskinesia, without mention of fluctuations: Secondary | ICD-10-CM

## 2023-11-29 DIAGNOSIS — M6281 Muscle weakness (generalized): Secondary | ICD-10-CM | POA: Diagnosis not present

## 2023-11-29 DIAGNOSIS — R7303 Prediabetes: Secondary | ICD-10-CM | POA: Diagnosis not present

## 2023-11-29 DIAGNOSIS — K5909 Other constipation: Secondary | ICD-10-CM | POA: Diagnosis not present

## 2023-11-29 NOTE — Telephone Encounter (Signed)
 Dr. Arbutus Leas,  Kristin [name] is scheduled for PT return evaluation on 12/20/2023, as recommended when she was last discharged from therapy, due to progressive nature of diagnosis.  Pt was in agreement with this plan.  If you are in agreement, please send updated order for PT eval and treat via epic.  Thank you,  Lonia Blood, PT 11/29/23 9:09 AM Phone: [phone] Fax: [phone]  [facility] [address] [place], [address] [place], [place] [address] Phone # [phone] Fax # [phone]

## 2023-12-11 ENCOUNTER — Encounter: Payer: Self-pay | Admitting: Neurology

## 2023-12-13 DIAGNOSIS — R7303 Prediabetes: Secondary | ICD-10-CM | POA: Diagnosis not present

## 2023-12-13 DIAGNOSIS — K3184 Gastroparesis: Secondary | ICD-10-CM | POA: Diagnosis not present

## 2023-12-13 DIAGNOSIS — K5909 Other constipation: Secondary | ICD-10-CM | POA: Diagnosis not present

## 2023-12-13 DIAGNOSIS — E66813 Obesity, class 3: Secondary | ICD-10-CM | POA: Diagnosis not present

## 2023-12-13 DIAGNOSIS — Z6841 Body Mass Index (BMI) 40.0 and over, adult: Secondary | ICD-10-CM | POA: Diagnosis not present

## 2023-12-13 DIAGNOSIS — E65 Localized adiposity: Secondary | ICD-10-CM | POA: Diagnosis not present

## 2023-12-13 DIAGNOSIS — I10 Essential (primary) hypertension: Secondary | ICD-10-CM | POA: Diagnosis not present

## 2023-12-17 DIAGNOSIS — M0609 Rheumatoid arthritis without rheumatoid factor, multiple sites: Secondary | ICD-10-CM | POA: Diagnosis not present

## 2023-12-17 DIAGNOSIS — Z79899 Other long term (current) drug therapy: Secondary | ICD-10-CM | POA: Diagnosis not present

## 2023-12-17 DIAGNOSIS — E559 Vitamin D deficiency, unspecified: Secondary | ICD-10-CM | POA: Diagnosis not present

## 2023-12-17 DIAGNOSIS — R600 Localized edema: Secondary | ICD-10-CM | POA: Diagnosis not present

## 2023-12-17 NOTE — Progress Notes (Signed)
 Virtual Visit via Video Note  I connected with Kristin [name] on 12/26/23 at 11:30 AM EDT by a video enabled telemedicine application and verified that I am speaking with the correct person using two identifiers.  Location: Patient: home Provider: home office Persons participated in the visit- patient, provider    I discussed the limitations of evaluation and management by telemedicine and the availability of in person appointments. The patient expressed understanding and agreed to proceed.    I discussed the assessment and treatment plan with the patient. The patient was provided an opportunity to ask questions and all were answered. The patient agreed with the plan and demonstrated an understanding of the instructions.   The patient was advised to call back or seek an in-person evaluation if the symptoms worsen or if the condition fails to improve as anticipated.   Todd Fossa, MD     [facility] MD/PA/NP OP Progress Note  12/26/2023 12:13 PM Kristin [name]  MRN:  [id number]  Chief Complaint:  Chief Complaint  Patient presents with   Follow-up   HPI:  This is a follow-up appointment for depression and anxiety.  According to the chart review, she was seeing her neurologist, Dr. Winferd Hatter. Entecapone was restarted as it did not make difference in dizziness.   She states that she is having dizziness for the past 2 weeks.  It tends to happen when she tries to stand up.  She has been learning the way to avoid this type of dizziness through physical therapist.  Losartan  was reduced from today.  She states that her appetite is good, although she cannot eat very much due to gastroparesis.  She is trying to eat more protein.  It has been very frustrating.  She is trying, although she tries not to feel overwhelmed.  She has been working with his therapist.  She has been working on things around her father, who was deceased.  She states that he was very strict.  She was the oldest of 5, and was taking  care of her sister with bipolar disorder.  She was afraid of him as he tends to yell.  She thinks about him more than she would like.  She agrees that her tendency to please others and difficulty setting boundaries may be related to her relationship with her father.  Provided psychoeducation about natural process of worsening in her mood symptoms while working through therapy.  She has been sleeping a little better.  She denies restless leg.  She continues to feel fatigue, although it is better compared to before.  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  She is concerned about her fall.  However, getting a physical therapy is very helpful.  She denies SI.  She agrees with the plan as outlined below.    Wt Readings from Last 3 Encounters:  12/24/23 237 lb 3.2 oz (107.6 kg)  11/06/23 237 lb 3.2 oz (107.6 kg)  10/10/23 239 lb 13.8 oz (108.8 kg)     Visit Diagnosis:    ICD-10-CM   1. MDD (major depressive disorder), recurrent, in partial remission (HCC)  F33.41     2. Anxiety disorder, unspecified type  F41.9     3. Chronic fatigue  R53.82     4. Screening for iron  deficiency anemia  Z13.0 Ferritin      Past Psychiatric History: Please see initial evaluation for full details. I have reviewed the history. No updates at this time.     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   Hyperlipidemia    Hypertension    Infertility,  female    Low back pain 10/17/2007   Qualifier: Diagnosis of  By: Larrie Po MD, Wilmon Hashimoto    Measles as a child   Obesity    Osteoarthritis    Parkinson disease (HCC)    Plantar fasciitis of left foot 06/19/2012   Preventative health care 12/19/2015   Rheumatoid arthritis (HCC) 01/08/2018   Rosacea 10/05/2014   Swallowing difficulty    Urinary frequency 02/25/2013   Visual floaters 05/11/2014    Past Surgical History:  Procedure Laterality Date   ABDOMINAL HYSTERECTOMY  2006   total   esophageal     stretching   HERNIA REPAIR N/A 2022   hiatal hernia   laporoscopy     LEFT HEART CATH AND CORONARY ANGIOGRAPHY N/A 06/02/2019   Procedure: LEFT HEART CATH AND CORONARY ANGIOGRAPHY;  Surgeon: Lucendia Rusk, MD;  Location: [facility];  Service: Cardiovascular;  Laterality: N/A;   TONSILLECTOMY     TOTAL HIP ARTHROPLASTY Right 2010   UPPER GASTROINTESTINAL ENDOSCOPY     wisdom teeth extracted      Family Psychiatric History: Please see initial evaluation for full details. I have reviewed the history. No updates at this time.     Family History:  Family History  Problem Relation Age of Onset   Other Mother        arrythmia   Mental illness Mother        bipolar   Hyperlipidemia Mother    Thyroid  disease Mother    Depression Mother    Bipolar disorder Mother    Breast cancer Mother 59   Heart disease Father    Arthritis Father        rheumatoid   Hypertension Father    Hyperlipidemia Father    Depression Sister    Mental illness Sister        bipolar   Parkinson's disease Sister    Arthritis Sister    Arthritis Sister    Arthritis Sister    Breast cancer Maternal Aunt 35   Breast cancer Maternal Uncle        56s   Heart attack Paternal Uncle    Heart attack Paternal Grandfather    Breast cancer Cousin 42   Colon cancer Neg Hx    Esophageal cancer Neg Hx    Rectal cancer Neg Hx    Stomach cancer Neg Hx    Sleep apnea Neg Hx     Social History:   Social History   Socioeconomic History   Marital status: Married    Spouse name: Not on file   Number of children: 1   Years of education: Not on file   Highest education level: Master's degree (e.g.,  MA, MS, MEng, MEd, MSW, MBA)  Occupational History   Occupation: Retired International aid/development worker  Tobacco Use   Smoking status: Never   Smokeless tobacco: Never  Vaping Use   Vaping status: Never Used  Substance and Sexual Activity   Alcohol use: Yes    Alcohol/week: 1.0 standard drink of alcohol    Types: 1 Glasses of wine per week    Comment: occasional wine   Drug use: No   Sexual activity: Not Currently    Partners: Male  Other Topics Concern   Not on file  Social History Narrative   Lives with husband, 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 3 grandsons- 2 in [place] and 46 year old in [place] in 2023      Hobbies: book club, time with friends, time with couples friends, plays pianos   Social Drivers of Corporate investment banker Strain: Low Risk  (11/06/2023)   Overall Financial Resource Strain (CARDIA)    Difficulty of Paying Living Expenses: Not hard at all  Food Insecurity: No Food Insecurity (11/06/2023)   Hunger Vital Sign    Worried About Running Out of Food in the Last Year: Never true    Ran Out of Food in the Last Year: Never true  Transportation Needs: No Transportation Needs (11/06/2023)   PRAPARE - Administrator, Civil Service (Medical): No    Lack of Transportation (Non-Medical): No  Physical Activity: Sufficiently Active (11/06/2023)   Exercise Vital Sign    Days of Exercise per Week: 5 days    Minutes of Exercise per Session: 40 min  Stress: No Stress Concern Present (11/06/2023)   [employer] of Occupational Health - Occupational Stress Questionnaire    Feeling of Stress : Only a little  Social Connections: Unknown (11/06/2023)   Social Connection and  Isolation Panel [NHANES]    Frequency of Communication with Friends and Family: More than three times a week    Frequency of Social Gatherings with Friends and Family: Three times a week    Attends Religious Services: Patient declined    Active Member of Clubs or Organizations: Yes    Attends Banker Meetings: 1 to 4 times per year    Marital Status: Married    Allergies:  Allergies  Allergen Reactions   Scopolamine Other (See Comments)    Panic hallu  Other Reaction(s): Hallucination, Not available   Prednisone  Dermatitis    Facial ulcers  Other Reaction(s): facial swelling   Bactrim [Sulfamethoxazole-Trimethoprim] Other (See Comments)    Oral ulcers and rash   Penicillins Hives and Other (See Comments)    Did it involve swelling of the face/tongue/throat, SOB, or low BP? No  Did it involve sudden or severe rash/hives, skin peeling, or any reaction on the inside of your mouth or nose? No  Did you need to seek medical attention at a hospital or doctor's office? No  When did it last happen?      30+ years ago  If all above answers are "NO", may proceed with cephalosporin use.   Sulfa Antibiotics     Other Reaction(s): Not available    Metabolic Disorder Labs: Lab Results  Component Value Date   HGBA1C 6.0 03/28/2023   MPG 126 (H) 12/29/2013   No results found for: "PROLACTIN" Lab Results  Component Value Date   CHOL 147 03/28/2023   TRIG  114.0 03/28/2023   HDL 63.80 03/28/2023   CHOLHDL 2 03/28/2023   VLDL 22.8 03/28/2023   LDLCALC 60 03/28/2023   LDLCALC 58 01/12/2022   Lab Results  Component Value Date   TSH 1.10 01/12/2022   TSH 1.20 10/03/2021    Therapeutic Level Labs: No results found for: "LITHIUM" No results found for: "VALPROATE" No results found for: "CBMZ"  Current Medications: Current Outpatient Medications  Medication Sig Dispense Refill   carbidopa -levodopa  (SINEMET  CR) 50-200 MG tablet TAKE 1 TABLET BY MOUTH EVERYDAY AT  BEDTIME 90 tablet 0   carbidopa -levodopa  (SINEMET  IR) 25-100 MG tablet TAKE 2 TABLETS BY MOUTH AT 7AM, 2TABLETS AT 11AM, 1 TABLET AT 4PM 450 tablet 2   carvedilol  (COREG ) 3.125 MG tablet TAKE 1 TABLET BY MOUTH TWICE A DAY WITH FOOD 180 tablet 3   DULoxetine  (CYMBALTA ) 20 MG capsule Take 1 capsule (20 mg total) by mouth daily. 90 capsule 0   entacapone  (COMTAN ) 200 MG tablet Take 1 tablet (200 mg total) by mouth 3 (three) times daily. 270 tablet 1   escitalopram  (LEXAPRO ) 20 MG tablet Take 1 tablet (20 mg total) by mouth daily. 90 tablet 3   losartan  (COZAAR ) 50 MG tablet TAKE 1 TABLET BY MOUTH EVERY DAY (Patient taking differently: Take 25 mg by mouth daily.) 90 tablet 3   omeprazole  (PRILOSEC) 20 MG capsule TAKE 1 CAPSULE BY MOUTH 2 TIMES DAILY AS NEEDED. 180 capsule 1   Plecanatide  (TRULANCE ) 3 MG TABS Take 1 tablet (3 mg total) by mouth daily. 30 tablet 3   polyethylene glycol (MIRALAX  / GLYCOLAX ) 17 g packet Take 17 g by mouth daily as needed for mild constipation.     pramipexole  (MIRAPEX ) 0.5 MG tablet TAKE 1 TABLET BY MOUTH 3 TIMES DAILY. 270 tablet 0   rosuvastatin  (CRESTOR ) 10 MG tablet TAKE 1 TABLET BY MOUTH ON MONDAYS,WEDNESDAYS, AND FRIDAYS 39 tablet 3   Vitamin D , Ergocalciferol , (DRISDOL ) 1.25 MG (50000 UNIT) CAPS capsule TAKE 1 CAPSULE (50,000 UNITS TOTAL) BY MOUTH EVERY 7 (SEVEN) DAYS 12 capsule 1   No current facility-administered medications for this visit.     Musculoskeletal: Strength & Muscle Tone:  N/A Gait & Station:  N/A Patient leans: N/A  Psychiatric Specialty Exam: Review of Systems  Psychiatric/Behavioral:  Positive for sleep disturbance. Negative for agitation, behavioral problems, confusion, decreased concentration, dysphoric mood, hallucinations, self-injury and suicidal ideas. The patient is nervous/anxious. The patient is not hyperactive.   All other systems reviewed and are negative.   There were no vitals taken for this visit.There is no height or weight  on file to calculate BMI.  General Appearance: Well Groomed  Eye Contact:  Good  Speech:  Clear and Coherent  Volume:  Normal  Mood:   good  Affect:  Appropriate, Congruent, and Full Range  Thought Process:  Coherent  Orientation:  Full (Time, Place, and Person)  Thought Content: Logical   Suicidal Thoughts:  No  Homicidal Thoughts:  No  Memory:  Immediate;   Good  Judgement:  Good  Insight:  Good  Psychomotor Activity:  Normal  Concentration:  Concentration: Good and Attention Span: Good  Recall:  Good  Fund of Knowledge: Good  Language: Good  Akathisia:  No  Handed:  Right  AIMS (if indicated): not done  Assets:  Communication Skills Desire for Improvement  ADL's:  Intact  Cognition: WNL  Sleep:  Fair   Screenings: GAD-7    Flowsheet Row Office Visit from 05/09/2023 in [facility]  [facility] from 03/28/2023 in [facility] Visit from 01/23/2023 in [facility]  Total GAD-7 Score 5 6 0      Mini-Mental    Flowsheet Row Office Visit from 10/15/2017 in [facility]  Total Score (max 30 points ) 30      PHQ2-9    Flowsheet Row Office Visit from 11/06/2023 in [facility] Visit from 05/09/2023 in [facility] from 03/28/2023 in [facility] Clinical Support from 03/06/2023 in [facility] [employer] [facility] Visit from 01/23/2023 in [facility]  PHQ-2 Total Score 0 0 4 0 0  PHQ-9 Total Score 8 9 15  0 0      Flowsheet Row ED from 10/10/2023 in [facility] Emergency Department at [facility] ED from 03/22/2023 in [facility] Department at [facility] ED from 12/10/2020 in [facility] Emergency Department at  [facility]  C-SSRS RISK CATEGORY No Risk No Risk No Risk        Assessment and Plan:  Kristin [name] is a 73 y.o. year old female with a history of depression, anxiety, parkinson, seronegative, Nonerosive RA in remission,  hypertension, vitamin B 12/D deficiency, gastroparesis, who presents for the follow-up appointment for below.   1. MDD (major depressive disorder), recurrent, in partial remission (HCC) 2. Anxiety disorder, unspecified type Acute stressors include: fear of falling secondary to Parkinson since 2015 Other stressors include:    History: suffers from depression for many years, Originally on duloxetine  30 mg daily, lexapro  20 mg daily. Relapse in mood symptoms after trying monotherapy   Although she reports occasional anxiety and intrusive thoughts about her childhood related to her father, who was very strict, her mood has been overall since the last visit.  She has started working with a physical therapist, which has been helpful in building strength and confidence. She has a fear of falling, which at times limits her activity.   Although there is a concern of polypharmacy of taking both SSRI/SNRI, we will continue current medication regimen for now to avoid any discontinuation symptoms especially given she has new symptoms of dizziness.  Will continue on Lexapro  and duloxetine  to target depression and anxiety. Will plan to slowly decrease duloxetine  in the future given she had a relapse while tapering off Lexapro .  She is aware of the risk of serotonin syndrome, QTc prolongation.   3. Chronic fatigue 4. Screening for iron  deficiency anemia Slightly improving since improvement in insomnia.  We will check ferritin to rule out iron  deficiency given limited p.o. intake due to gastroparesis.    Plan Continue duloxetine  20 mg daily - reduced 07/2023 Continue lexapro  20 mg daily (EKG HR 72, QTc 448 msec, 01/2024) Obtain labs- ferritin Next appointment: 6/18 at 11:30 She  sees a therapist, Delories F[facility], every two weeks   The patient demonstrates the following risk factors for suicide: Chronic risk factors for suicide include: psychiatric disorder of depression, anxiety . Acute risk factors for suicide include: loss (financial, interpersonal, professional). Protective factors for this patient include: positive social support, responsibility to others (children, family), coping skills, and hope for the future. Considering these factors, the overall  suicide risk at this point appears to be low. Patient is appropriate for outpatient follow up.   A total of 35 minutes was spent on the following activities during the encounter date, which includes but is not limited to: preparing to see the patient (e.g., reviewing tests and records), obtaining and/or reviewing separately obtained history, performing a medically necessary examination or evaluation, counseling and educating the patient, family, or caregiver, ordering medications, tests, or procedures, referring and communicating with other healthcare professionals (when not reported separately), documenting clinical information in the electronic or paper health record, independently interpreting test or lab results and communicating these results to the family or caregiver, and coordinating care (when not reported separately).   Collaboration of Care: Collaboration of Care: Other reviewed notes in Epic  Patient/Guardian was advised Release of Information must be obtained prior to any record release in order to collaborate their care with an outside provider. Patient/Guardian was advised if they have not already done so to contact the registration department to sign all necessary forms in order for us  to release information regarding their care.   Consent: Patient/Guardian gives verbal consent for treatment and assignment of benefits for services provided during this visit. Patient/Guardian expressed  understanding and agreed to proceed.    Todd Fossa, MD 12/26/2023, 12:14 PM

## 2023-12-18 ENCOUNTER — Ambulatory Visit: Payer: Medicare Other

## 2023-12-18 ENCOUNTER — Ambulatory Visit: Payer: Medicare Other | Admitting: Physical Therapy

## 2023-12-18 ENCOUNTER — Ambulatory Visit: Payer: Medicare Other | Attending: Neurology | Admitting: Occupational Therapy

## 2023-12-18 DIAGNOSIS — R29818 Other symptoms and signs involving the nervous system: Secondary | ICD-10-CM | POA: Insufficient documentation

## 2023-12-18 DIAGNOSIS — R471 Dysarthria and anarthria: Secondary | ICD-10-CM

## 2023-12-18 DIAGNOSIS — R42 Dizziness and giddiness: Secondary | ICD-10-CM | POA: Insufficient documentation

## 2023-12-18 DIAGNOSIS — R2689 Other abnormalities of gait and mobility: Secondary | ICD-10-CM | POA: Insufficient documentation

## 2023-12-18 DIAGNOSIS — R293 Abnormal posture: Secondary | ICD-10-CM | POA: Insufficient documentation

## 2023-12-18 DIAGNOSIS — R2681 Unsteadiness on feet: Secondary | ICD-10-CM | POA: Insufficient documentation

## 2023-12-18 DIAGNOSIS — M6281 Muscle weakness (generalized): Secondary | ICD-10-CM | POA: Insufficient documentation

## 2023-12-18 NOTE — Therapy (Signed)
 [place] [facility] [address], [address] [place], [place], [address] Phone: [phone]   Fax:  [phone]  Patient Details  Name: Kristin [name] MRN: [id number] Date of Birth: 1951/04/12 Referring Provider:  Fran Imus, DO  Encounter Date: 12/18/2023  Speech Therapy Parkinson's Disease Screen   Decibel Level today: 71dB  (WNL=70-72 dB) with sound level meter 30cm away from pt's masked mouth. Pt's conversational volume has remained at [facility] level since last treatment course  Pt does not report difficulty with swallowing, which does not warrant further evaluation  Pt does does not require speech therapy services at this time. Recommend ST screen in another 6-8 months  [facility], CCC-SLP 12/18/2023, 12:38 PM  [place]  [facility] [address], [address] [place], [place], [address] Phone: [phone]   Fax:  [phone]

## 2023-12-18 NOTE — Therapy (Signed)
 [place] [facility] [address], [address] [place], [place], [address] Phone: [phone]   Fax:  [phone]  Patient Details  Name: Kristin [name] MRN: [id number] Date of Birth: [birth year] Referring Provider:  Almira Jaeger, MD  Encounter Date: 12/18/2023  Occupational Therapy Parkinson's Disease Screen  Hand dominance:  Left   Handwriting: PPT #1 ([place]): 14.23 sec.  Good sizing and spacing, 100% legibility.  Physical Performance Test item #2 (simulated eating):  15.72 sec  9-hole peg test:    RUE  26.44 sec        LUE  23.12 sec  Box & Blocks Test:   RUE  57 blocks        LUE  55 blocks (hit barrier x2)  Other Comments:  Pt reports feeling pretty dizzy "all the time" reports plan to f/u with Dr. Winferd Hatter as she has been off the entacapone for a week with no change in dizziness.  Pt reports handwriting is better in the evenings compared to mornings.  Pt reports mild increase in dropping items.  Pt reports using alarm to increase timeliness when taking meds.   Pt does not require occupational therapy services at this time.  Recommended occupational therapy screen in  6-8 months.   Anthonette Kinsman, OT 12/18/2023, 11:49 AM  [place]  [facility] [address] [place], [place], [address] Phone: [phone]   Fax:  [phone]

## 2023-12-20 ENCOUNTER — Encounter: Payer: Self-pay | Admitting: Physical Therapy

## 2023-12-20 ENCOUNTER — Ambulatory Visit: Payer: Medicare Other | Admitting: Physical Therapy

## 2023-12-20 ENCOUNTER — Other Ambulatory Visit: Payer: Self-pay

## 2023-12-20 DIAGNOSIS — R42 Dizziness and giddiness: Secondary | ICD-10-CM | POA: Diagnosis present

## 2023-12-20 DIAGNOSIS — R293 Abnormal posture: Secondary | ICD-10-CM | POA: Diagnosis present

## 2023-12-20 DIAGNOSIS — R2689 Other abnormalities of gait and mobility: Secondary | ICD-10-CM | POA: Diagnosis present

## 2023-12-20 DIAGNOSIS — R471 Dysarthria and anarthria: Secondary | ICD-10-CM | POA: Diagnosis not present

## 2023-12-20 DIAGNOSIS — R2681 Unsteadiness on feet: Secondary | ICD-10-CM | POA: Diagnosis present

## 2023-12-20 DIAGNOSIS — M6281 Muscle weakness (generalized): Secondary | ICD-10-CM

## 2023-12-20 DIAGNOSIS — R29818 Other symptoms and signs involving the nervous system: Secondary | ICD-10-CM | POA: Diagnosis present

## 2023-12-20 NOTE — Progress Notes (Signed)
 Assessment/Plan:   1.  Parkinsons Disease             -Continue pramipexole  0.5 mg 3 times per day.  Higher dosages with SE and still with minimal compulsive SE (cleaning house)             -Continue carbidopa /levodopa  25/100, 2/2/1 (can chew half to 1 in the middle of the night if awakens with tremor).   I sent new RX to pharm             -She will continue Carbidopa /levodopa  50/200 at night.  This has definitely helped restless leg and has helped most of the nighttime cramping of the feet and legs.  -restart entacapone , 200 mg with each dose of levodopa .  She thought it was making her dizzy, but we stopped it and it did not change the dizziness.  -she is following with dermatology at [facility] dermatology  -We discussed Vyalev, which is foscarbidopa/foslevodopa pump that is newly FDA approved.  We discussed that it is for motor fluctuations in adults with advanced Parkinson's disease.  We discussed that this likely will not be on Medicare formulary until the latter half of 2025.  We discussed risks and benefits of this drug.   She was shown this today.  She is interested when insurance approves 2.  Depression and anxiety             - She is on a combination of Lexapro  and Cymbalta .  Dr. Edda Goo is working on tapering the Cymbalta .  -Patient is following with a Veterinary surgeon. 3.  Dysphagia             -last MBE May, 2021 with mild pharyngeal cervical esophageal deficits.  It was recommended to consider esophageal assessment.  She has an EGD upcoming to assess and an esophageal barium swallow 4.  Rheumatoid arthritis             -On methotrexate.  Following with [facility]ology 5.  REM behavior disorder             -Understands safety associated with this.  Does not want more medications. 6.  Gastroparesis             -Following with Dr. Dorothy Gates.  She plans to make another appt to discuss.  -Failed Linzess  and trulance  7.  Excess sweating  -is long term but discussed with her that I don't want  to add meds b/c clonidine is tx and she already has dizziness.    -cymbalta  may be contributing to the sweating but she tried to d/c and was crying all the time.  Dr. Edda Goo is working on tapering it down.  She is currently on 20 mg.  She is on Lexapro . 9.  EDS  -Saw Dr. Omar Bibber in October, 2024 and nocturnal polysomnogram was negative.  - Unfortunately, post negative polysomnogram, she was given no further recommendations for the sleep issues. 10.  Urinary incontinence  -has done pelvic floor PT with benefit at day but struggles at night still 11.  Dizziness  -she was orthostatic in the office but her BP's were too high to start midodrine/florinef/droxidopa.  In addition, I wonder if the coreg /losarten can be decreased and solve the issue but that is out of my field.  I will ask her pcp if it can be decreased.    Subjective:   Kristin [name] was seen today in follow up for Parkinsons disease.  My previous records were reviewed prior to todays  visit as well as outside records available to me.  Husband with patient and supplements history.  She was worked in today.  She called me not long ago complaining about dizziness.  She thought maybe it was from the entacapone .  We stopped the entacapone , but it did not help and she remained dizzy.  She had recalled that she had started on metformin on March 31 and so she stopped that as well.  Again, she remained dizzy.  She thought her blood pressure had remained pretty good, but we brought her in today to do some orthostatics.  She does feel more inner tremor being off of the entacapone .    Current prescribed movement disorder medications: Carbidopa /levodopa  25/100, 2 tablets in the morning, 2 in the afternoon and 1 in the evening (7am/then between 11am-noon/4pm) Entacapone , 200 mg, 1 tablet 3 times per day with each dose of levodopa  Carbidopa /levodopa  50/200 at bedtime Pramipexole  0.5 mg 3 times per day   Prior meds: pramipexole  (higher dosages with mild  compulsive behavior - cleaning/some shopping)   ALLERGIES:   Allergies  Allergen Reactions   Scopolamine Other (See Comments)    Panic hallu  Other Reaction(s): Hallucination, Not available   Prednisone  Dermatitis    Facial ulcers  Other Reaction(s): facial swelling   Bactrim [Sulfamethoxazole-Trimethoprim] Other (See Comments)    Oral ulcers and rash   Penicillins Hives and Other (See Comments)    Did it involve swelling of the face/tongue/throat, SOB, or low BP? No  Did it involve sudden or severe rash/hives, skin peeling, or any reaction on the inside of your mouth or nose? No  Did you need to seek medical attention at a hospital or doctor's office? No  When did it last happen?      30+ years ago  If all above answers are "NO", may proceed with cephalosporin use.   Sulfa Antibiotics     Other Reaction(s): Not available    CURRENT MEDICATIONS:  Outpatient Encounter Medications as of 12/24/2023  Medication Sig   carbidopa -levodopa  (SINEMET  CR) 50-200 MG tablet TAKE 1 TABLET BY MOUTH EVERYDAY AT BEDTIME   carbidopa -levodopa  (SINEMET  IR) 25-100 MG tablet TAKE 2 TABLETS BY MOUTH AT 7AM, 2TABLETS AT 11AM, 1 TABLET AT 4PM   carvedilol  (COREG ) 3.125 MG tablet TAKE 1 TABLET BY MOUTH TWICE A DAY WITH FOOD   DULoxetine  (CYMBALTA ) 20 MG capsule Take 1 capsule (20 mg total) by mouth daily.   entacapone  (COMTAN ) 200 MG tablet Take 1 tablet (200 mg total) by mouth 3 (three) times daily.   escitalopram  (LEXAPRO ) 20 MG tablet Take 1 tablet (20 mg total) by mouth daily.   losartan  (COZAAR ) 50 MG tablet TAKE 1 TABLET BY MOUTH EVERY DAY   omeprazole  (PRILOSEC) 20 MG capsule TAKE 1 CAPSULE BY MOUTH 2 TIMES DAILY AS NEEDED.   Plecanatide  (TRULANCE ) 3 MG TABS Take 1 tablet (3 mg total) by mouth daily.   polyethylene glycol (MIRALAX  / GLYCOLAX ) 17 g packet Take 17 g by mouth daily as needed for mild constipation.   pramipexole  (MIRAPEX ) 0.5 MG tablet TAKE 1 TABLET BY MOUTH 3 TIMES DAILY.    rosuvastatin  (CRESTOR ) 10 MG tablet TAKE 1 TABLET BY MOUTH ON MONDAYS,WEDNESDAYS, AND FRIDAYS   Vitamin D , Ergocalciferol , (DRISDOL ) 1.25 MG (50000 UNIT) CAPS capsule TAKE 1 CAPSULE (50,000 UNITS TOTAL) BY MOUTH EVERY 7 (SEVEN) DAYS   [DISCONTINUED] carbidopa -levodopa  (SINEMET  IR) 25-100 MG tablet TAKE 2 TABLETS BY MOUTH AT 7AM, 2TABLETS AT 11AM, 1 TABLET AT 4PM   [  DISCONTINUED] entacapone  (COMTAN ) 200 MG tablet TAKE 1 TABLET BY MOUTH THREE TIMES A DAY   No facility-administered encounter medications on file as of 12/24/2023.    Objective:   PHYSICAL EXAMINATION:    VITALS:   Vitals:   12/24/23 0846 12/24/23 0847 12/24/23 0848  BP:  126/72   Pulse:  74   SpO2: 94% 94% 98%  Weight: 237 lb 3.2 oz (107.6 kg)     Orthostatic VS for the past 72 hrs (Last 3 readings):  Orthostatic BP Patient Position BP Location Orthostatic Pulse  12/24/23 0848 116/60 Standing Left Arm 72  12/24/23 0847 -- Sitting Left Arm --  12/24/23 0846 144/84 Supine Left Arm 71     GEN:  The patient appears stated age and is in NAD. HEENT:  Normocephalic, atraumatic.  The mucous membranes are moist.    Neurological examination:  Orientation: The patient is alert and oriented x3. Cranial nerves: There is good facial symmetry without significant hypomimia.  EOMI.   The speech is fluent and clear. Soft palate rises symmetrically and there is no tongue deviation. Hearing is intact to conversational tone. Sensation: Sensation is intact to light touch throughout Motor: Strength is at least antigravity x4.  Movement examination: Tone: There is mild increased tone in the RUE Abnormal movements: mild R foot tremor Coordination:  There is good RAMs today in the bilateral UE/LE.  No decremation Gait and Station: The patient has no difficulty arising out of a deep-seated chair without the use of the hands. The patients gait is slow and tenuous but she isn't shuffling.  She has slight decrease arm swing on the R.  This is  same as previous  I have reviewed and interpreted the following labs independently    Chemistry      Component Value Date/Time   NA 140 10/10/2023 1044   NA 144 12/05/2019 0935   K 4.2 10/10/2023 1044   CL 109 10/10/2023 1044   CO2 24 10/10/2023 1044   BUN 19 10/10/2023 1044   BUN 13 12/05/2019 0935   CREATININE 0.58 10/10/2023 1044   CREATININE 0.71 01/04/2015 1046      Component Value Date/Time   CALCIUM  9.1 10/10/2023 1044   ALKPHOS 73 10/10/2023 1044   AST 27 10/10/2023 1044   ALT 10 10/10/2023 1044   BILITOT 0.6 10/10/2023 1044   BILITOT 0.4 11/27/2016 1123       Lab Results  Component Value Date   WBC 5.8 10/10/2023   HGB 12.1 10/10/2023   HCT 36.5 10/10/2023   MCV 91.5 10/10/2023   PLT 201 10/10/2023    Lab Results  Component Value Date   TSH 1.10 01/12/2022     Total time spent on today's visit was 31 minutes, including both face-to-face time and nonface-to-face time.  Time included that spent on review of records (prior notes available to me/labs/imaging if pertinent), discussing treatment and goals, answering patient's questions and coordinating care.  Cc:  Almira Jaeger, MD

## 2023-12-20 NOTE — Therapy (Addendum)
 OUTPATIENT PHYSICAL THERAPY NEURO EVALUATION   Patient Name: Kristin [name] MRN: [id number] DOB:11-14-1950, 73 y.o., female Today's Date: 12/20/2023   PCP:    Shelva Majestic, MD   REFERRING PROVIDER: Vladimir Faster, DO   END OF SESSION:  PT End of Session - 12/20/23 0759     Visit Number 1    Number of Visits 17    Date for PT Re-Evaluation 02/15/24    Authorization Type Medicare/[employer]    Progress Note Due on Visit 10    PT Start Time 0803    PT Stop Time 0852    PT Time Calculation (min) 49 min    Equipment Utilized During Treatment Gait belt    Activity Tolerance Patient tolerated treatment well    Behavior During Therapy [facility] for tasks assessed/performed             Past Medical History:  Diagnosis Date   Abnormal vaginal Pap smear    Acute pharyngitis 02/25/2013   Anemia    Anxiety    Arthritis    BCC (basal cell carcinoma of skin) 10/05/2014   On back   Chicken pox as a child   Chronic UTI    sees dr Vonita Moss   Constipation 12/07/2015   Depression with anxiety 11/02/2009   Qualifier: Diagnosis of  By: Mayford Knife, LPN, Bonnye M    Dermatitis 07/17/2012   Esophageal stricture 1994   Fibroids    Foot pain, bilateral 06/19/2012   GERD (gastroesophageal reflux disease)    Hiatal hernia    Hyperglycemia 08/19/2013   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   Hyperlipidemia    Hypertension    Infertility, female    Low back pain 10/17/2007   Qualifier: Diagnosis of  By: Lovell Sheehan MD, [name]    Measles as a child   Obesity    Osteoarthritis    Parkinson disease (HCC)    Plantar fasciitis of left foot 06/19/2012   Preventative health care 12/19/2015   Rheumatoid arthritis (HCC) 01/08/2018   Rosacea 10/05/2014   Swallowing difficulty    Urinary frequency 02/25/2013   Visual floaters 05/11/2014   Past Surgical History:  Procedure Laterality Date   ABDOMINAL HYSTERECTOMY  2006   total   esophageal     stretching   HERNIA REPAIR N/A 2022   hiatal hernia    laporoscopy     LEFT HEART CATH AND CORONARY ANGIOGRAPHY N/A 06/02/2019   Procedure: LEFT HEART CATH AND CORONARY ANGIOGRAPHY;  Surgeon: Corky Crafts, MD;  Location: [facility] INVASIVE CV LAB;  Service: Cardiovascular;  Laterality: N/A;   TONSILLECTOMY     TOTAL HIP ARTHROPLASTY Right 2010   UPPER GASTROINTESTINAL ENDOSCOPY     wisdom teeth extracted     Patient Active Problem List   Diagnosis Date Noted   Vitamin B 12 deficiency 05/30/2021   Anemia 03/16/2020   Coronary artery disease involving native coronary artery of native heart without angina pectoris 06/12/2019   Diastolic dysfunction 05/16/2019   Rheumatoid arthritis (HCC) 01/08/2018   Hand pain, right 10/15/2017   Vitamin D deficiency 12/20/2016   Prediabetes 12/20/2016   Shortness of breath on exertion 11/27/2016   Morbid obesity (HCC)    Constipation 12/07/2015   BCC (basal cell carcinoma of skin) 10/05/2014   Rosacea 10/05/2014   Parkinson's disease (HCC) 05/11/2014   Screening for cervical cancer 07/17/2012   Foot pain, bilateral 06/19/2012   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   Depression with anxiety 11/02/2009   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  Lumbar back pain with radiculopathy affecting lower extremity 10/17/2007   Essential hypertension 07/05/2007   Osteoarthritis 07/05/2007   Hyperlipidemia 06/26/2007   H/O: iron deficiency anemia 05/08/2007   Hiatal hernia with GERD 05/08/2007    ONSET DATE: 11/29/2023 (MD referral)  REFERRING DIAG: G20.A1 (ICD-10-CM) - Parkinson's disease without dyskinesia or fluctuating manifestations (HCC)   THERAPY DIAG:  Unsteadiness on feet - Plan: PT plan of care cert/re-cert  Other abnormalities of gait and mobility - Plan: PT plan of care cert/re-cert  Muscle weakness (generalized) - Plan: PT plan of care cert/re-cert  Abnormal posture - Plan: PT plan of care cert/re-cert  Dizziness and giddiness - Plan: PT plan of care cert/re-cert  Rationale for Evaluation  and Treatment: Rehabilitation  SUBJECTIVE:                                                                                                                                                                                             SUBJECTIVE STATEMENT: "I've been crappy."  I've been so dizzy.  It's like a spinning sensation when I am standing or walking and I'm fearful that I may fall.  Stopped taking the Entacapone, and I see Dr. Arbutus Leas next week.  Have been more intensely dizzy for the past 2 weeks. Not really able to exercise because of the dizziness. Pt accompanied by: self  PERTINENT HISTORY: Parkinson's disease, gastroparesis, anxiety, hx of chest pain, knee OA, RA  PAIN:  Are you having pain? No  PRECAUTIONS: Fall  RED FLAGS: None   WEIGHT BEARING RESTRICTIONS: No  FALLS: Has patient fallen in last 6 months? No  LIVING ENVIRONMENT: Lives with: lives with their spouse Lives in: House/apartment Stairs: stairs in home with rails Has following equipment at home: Dan Humphreys - 4 wheeled and bilateral walking poles  PLOF: Independent, Independent with household mobility with device, and Independent with community mobility with device  PATIENT GOALS: To try to combat the slowness and work on the dizziness.  OBJECTIVE:  Note: Objective measures were completed at Evaluation unless otherwise noted.  DIAGNOSTIC FINDINGS: NA for this episode  COGNITION: Overall cognitive status: Within functional limits for tasks assessed   SENSATION: Light touch: WFL  EDEMA: Pt reports some BLE swelling and being monitored by MD   POSTURE: rounded shoulders and forward head  VITALS:   142/90, HR 71 bpm Seated   144/79  HR 74 bpm  Standing    LOWER EXTREMITY ROM:   AROM WFL   LOWER EXTREMITY MMT:  Grossly tested at least 4/5 BLEs   TRANSFERS: Sit to stand: Modified independence  Assistive device utilized: None  Stand to sit: Modified independence  Assistive device utilized: None      *Reports she needs to use hands most times to push up to stand from chairs at home*  GAIT:   FUNCTIONAL TESTS:  5 times sit to stand: 15.25 sec Timed up and go (TUG): 19.35 sed with walking pole (compared to 14 sec last bout of therapy)* 10 meter walk test: 17.98 sec (1.82 ft/sec) *compared to 2.52 ft/sec last bout of therapy* Mini-BESTest: 13/24 (decreased from 24/28 from last bout of therapy) TUG cognitive: 20.07 sec (compared to 15 sec last bout of therapy)* 3 M backwards walk:  11.94 sec                                                                                                                              TREATMENT DATE: 12/20/2023    PATIENT EDUCATION: Education details: Initial eval results, POC; encouraged general activity and walking with 4WW; preparation for neurologist visit to discuss symptoms/dizziness/slowed mobility  Person educated: Patient Education method: Explanation Education comprehension: verbalized understanding  HOME EXERCISE PROGRAM: Not yet initiated  GOALS: Goals reviewed with patient? Yes  SHORT TERM GOALS: Target date: 01/18/2024  Pt will be independent with HEP for improved balance, strength, gait. Baseline: Goal status: INITIAL  2.  Pt will improve 5x sit<>stand to less than or equal to 12.5 sec to demonstrate improved functional strength and transfer efficiency. Baseline: 15.25 sec Goal status: INITIAL  3.  Pt will improve TUG score to less than or equal to 15 sec for decreased fall risk. Baseline: 19 sec Goal status: INITIAL  LONG TERM GOALS: Target date: 02/15/2024  Pt will be independent with HEP for improved balance, strength, gait. Baseline:  Goal status: INITIAL  2.  Pt will improve MiniBESTest score to at least 20/28 to decrease fall risk. Baseline: 13/28 Goal status: INITIAL  3.  Pt will improve gait velocity to at least 2.3 ft/sec for improved gait efficiency and safety.  Baseline: 1.82 ft/sec Goal status:  INITIAL  4.  Pt will verbalize plans for continued community fitness to maximize gains made in PT. Baseline:  Goal status: INITIAL   ASSESSMENT:  CLINICAL IMPRESSION: Patient is a 73 y.o. female who was seen today for physical therapy evaluation and treatment for Parkinson's disease.   She has main c/o dizziness (which she clarifies as unsteadiness), which mostly is present with standing and with gait.  She is known to this therapist from previous bouts of therapy, with most recent discharge being 06/2023.  She presents today with decreased balance, decreased functional strength, bradykinesia, tremors, decreased timing and coordination of gait.  *Of note*, pt's balance, functional strength and gait measures have decreased significantly since last bout of therapy.  Gait velocity is 1.82 ft/sec (considered fall risk and limited community ambulator, compared to 2.52 ft/sec); TUG scores are 19 and 20 seconds (considered fall risk), compared to 14 and 15 seconds; MiniBESTest score is 13/28 (fall risk),  compared to 24/28.  She is at fall risk per above objective measures and she describes anxiety and caution with gait and limited exercise activities due to dizziness in past several months.  She would benefit from skilled PT to address the above stated goals to decrease fall risk and improve her overall functional mobility and independence.    OBJECTIVE IMPAIRMENTS: Abnormal gait, decreased balance, decreased mobility, difficulty walking, decreased strength, and dizziness.   ACTIVITY LIMITATIONS: standing, transfers, and locomotion level  PARTICIPATION LIMITATIONS: meal prep, cleaning, laundry, driving, shopping, and community activity  PERSONAL FACTORS: 3+ comorbidities: see above PMH; increasing dizziness/unsteadiness in past 1-2 months  are also affecting patient's functional outcome.   REHAB POTENTIAL: Good  CLINICAL DECISION MAKING: Evolving/moderate complexity  EVALUATION COMPLEXITY:  Moderate  PLAN:  PT FREQUENCY: 2x/week  PT DURATION: 8 weeks plus eval visit  PLANNED INTERVENTIONS: 97750- Physical Performance Testing, 97110-Therapeutic exercises, 97530- Therapeutic activity, [id number]- Neuromuscular re-education, 97535- Self Care, 28413- Manual therapy, [phone]- Gait training, Patient/Family education, Balance training, and Vestibular training  PLAN FOR NEXT SESSION: Assess true orthostatic BP measures; vestibular assessment for dizziness.  Initiate HEP (need to include compliant surface balance for vestibular system retraining   Jennine Peddy W., PT 12/20/2023, 9:27 AM  [facility] [address], [address] [place], [place] [address] Phone # [phone] Fax # [phone]

## 2023-12-20 NOTE — Addendum Note (Signed)
 Addended by: Kelsey Patricia on: 12/20/2023 09:28 AM   Modules accepted: Orders

## 2023-12-24 ENCOUNTER — Encounter: Payer: Self-pay | Admitting: Neurology

## 2023-12-24 ENCOUNTER — Ambulatory Visit (INDEPENDENT_AMBULATORY_CARE_PROVIDER_SITE_OTHER): Admitting: Neurology

## 2023-12-24 VITALS — BP 126/72 | HR 74 | Wt 237.2 lb

## 2023-12-24 DIAGNOSIS — G20A1 Parkinson's disease without dyskinesia, without mention of fluctuations: Secondary | ICD-10-CM

## 2023-12-24 DIAGNOSIS — G20B1 Parkinson's disease with dyskinesia, without mention of fluctuations: Secondary | ICD-10-CM | POA: Diagnosis not present

## 2023-12-24 DIAGNOSIS — G903 Multi-system degeneration of the autonomic nervous system: Secondary | ICD-10-CM

## 2023-12-24 MED ORDER — CARBIDOPA-LEVODOPA 25-100 MG PO TABS: ORAL_TABLET | ORAL | 2 refills | Status: DC

## 2023-12-24 MED ORDER — ENTACAPONE 200 MG PO TABS
200.0000 mg | ORAL_TABLET | Freq: Three times a day (TID) | ORAL | 1 refills | Status: DC
Start: 2023-12-24 — End: 2024-03-19

## 2023-12-24 NOTE — Patient Instructions (Signed)
 Restart entacapone  and metformin Our annual symposium is on sept 26.  Save the date!  The physicians and staff at [facility] are committed to providing excellent care. You may receive a survey requesting feedback about your experience at our office. We strive to receive "very good" responses to the survey questions. If you feel that your experience would prevent you from giving the office a "very good " response, please contact our office to try to remedy the situation. We may be reached at [phone]. Thank you for taking the time out of your busy day to complete the survey.

## 2023-12-25 ENCOUNTER — Encounter: Payer: Self-pay | Admitting: Physical Therapy

## 2023-12-25 ENCOUNTER — Ambulatory Visit: Admitting: Physical Therapy

## 2023-12-25 DIAGNOSIS — R2689 Other abnormalities of gait and mobility: Secondary | ICD-10-CM | POA: Diagnosis not present

## 2023-12-25 DIAGNOSIS — M6281 Muscle weakness (generalized): Secondary | ICD-10-CM

## 2023-12-25 DIAGNOSIS — R42 Dizziness and giddiness: Secondary | ICD-10-CM | POA: Diagnosis not present

## 2023-12-25 DIAGNOSIS — R2681 Unsteadiness on feet: Secondary | ICD-10-CM | POA: Diagnosis not present

## 2023-12-25 DIAGNOSIS — R293 Abnormal posture: Secondary | ICD-10-CM | POA: Diagnosis not present

## 2023-12-25 DIAGNOSIS — R29818 Other symptoms and signs involving the nervous system: Secondary | ICD-10-CM | POA: Diagnosis not present

## 2023-12-25 NOTE — Patient Instructions (Signed)
 Ways to offset your blood pressure fluctuations (lowering):  1)  Talk to Dr. Arlene Ben  2)  Stay well hydrated through your day with plenty of water  3)  If you sit for long periods-do several exercises for your legs before you stand up  -ankle pumps  -seated march  -seated leg kicks  4)  When you transition between lying down to sitting up or sitting to standing, take a moment to "get your bearings" to get set before you begin to move  5)  ??Consider asking MD about abdominal binder or compression socks ??

## 2023-12-25 NOTE — Therapy (Signed)
 OUTPATIENT PHYSICAL THERAPY NEURO TREATMENT   Patient Name: Kristin [name] MRN: [id number] DOB:05-Jul-1951, 73 y.o., female Today's Date: 12/25/2023   PCP:    Almira Jaeger, MD   REFERRING PROVIDER: Shirline Dover, DO   END OF SESSION:  PT End of Session - 12/25/23 1226     Visit Number 2    Number of Visits 17    Date for PT Re-Evaluation 02/15/24    Authorization Type Medicare/[employer]    Progress Note Due on Visit 10    PT Start Time 1230    PT Stop Time 1325    PT Time Calculation (min) 55 min    Equipment Utilized During Treatment Gait belt    Activity Tolerance Patient tolerated treatment well    Behavior During Therapy WFL for tasks assessed/performed              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   Hyperlipidemia    Hypertension    Infertility, female    Low back pain 10/17/2007   Qualifier: Diagnosis of  By: Larrie Po MD, Wilmon Hashimoto    Measles as a child   Obesity    Osteoarthritis    Parkinson disease (HCC)    Plantar fasciitis of left foot 06/19/2012   Preventative health care 12/19/2015   Rheumatoid arthritis (HCC) 01/08/2018   Rosacea 10/05/2014   Swallowing difficulty    Urinary frequency 02/25/2013   Visual floaters 05/11/2014   Past Surgical History:  Procedure Laterality Date   ABDOMINAL HYSTERECTOMY  2006   total   esophageal     stretching   HERNIA REPAIR N/A 2022   hiatal  hernia   laporoscopy     LEFT HEART CATH AND CORONARY ANGIOGRAPHY N/A 06/02/2019   Procedure: LEFT HEART CATH AND CORONARY ANGIOGRAPHY;  Surgeon: Lucendia Rusk, MD;  Location: [facility];  Service: Cardiovascular;  Laterality: N/A;   TONSILLECTOMY     TOTAL HIP ARTHROPLASTY Right 2010   UPPER GASTROINTESTINAL ENDOSCOPY     wisdom teeth extracted     Patient Active Problem List   Diagnosis Date Noted   Vitamin B 12 deficiency 05/30/2021   Anemia 03/16/2020   Coronary artery disease involving native coronary artery of native heart without angina pectoris 06/12/2019   Diastolic dysfunction 05/16/2019   Rheumatoid arthritis (HCC) 01/08/2018   Hand pain, right 10/15/2017   Vitamin D  deficiency 12/20/2016   Prediabetes 12/20/2016   Shortness of breath on exertion 11/27/2016   Morbid obesity (HCC)    Constipation 12/07/2015   BCC (basal cell carcinoma of skin) 10/05/2014   Rosacea 10/05/2014   Parkinson's disease (HCC) 05/11/2014   Screening for cervical cancer 07/17/2012   Foot pain, bilateral 06/19/2012   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   Depression with anxiety 11/02/2009   DYSPHAGIA PHARYNGOESOPHAGEAL PHASE  11/25/2008   Lumbar back pain with radiculopathy affecting lower extremity 10/17/2007   Essential hypertension 07/05/2007   Osteoarthritis 07/05/2007   Hyperlipidemia 06/26/2007   H/O: iron  deficiency anemia 05/08/2007   Hiatal hernia with GERD 05/08/2007    ONSET DATE: 11/29/2023 (MD referral)  REFERRING DIAG: G20.A1 (ICD-10-CM) - Parkinson's disease without dyskinesia or fluctuating manifestations (HCC)   THERAPY DIAG:  Unsteadiness on feet  Other abnormalities of gait and mobility  Muscle weakness (generalized)  Abnormal posture  Dizziness and giddiness  Rationale for Evaluation and Treatment: Rehabilitation  SUBJECTIVE:                                                                                                                                                                                              SUBJECTIVE STATEMENT: Saw Dr. Winferd Hatter and the blood pressure did drop from lying down to sitting.  My nerves and anxiety are just so predominant.  When I use the walker, I feel safer.  It's the dizziness, slowness, and nervousness.  Will follow up with Dr. Arlene Ben about the BP medications. Pt accompanied by: self  PERTINENT HISTORY: Parkinson's disease, gastroparesis, anxiety, hx of chest pain, knee OA, RA  PAIN:  Are you having pain? No  PRECAUTIONS: Fall  RED FLAGS: None   WEIGHT BEARING RESTRICTIONS: No  FALLS: Has patient fallen in last 6 months? No  LIVING ENVIRONMENT: Lives with: lives with their spouse Lives in: House/apartment Stairs: stairs in home with rails Has following equipment at home: Otho Blitz - 4 wheeled and bilateral walking poles  PLOF: Independent, Independent with household mobility with device, and Independent with community mobility with device  PATIENT GOALS: To try to combat the slowness and work on the dizziness.  OBJECTIVE:   Pt reports no dizziness with bed mobility, rolling, supine<>sit, bending down to pick up objects Pt reports "unsteadiness with head turns, slowed mobility with turns/changes of directions", but NO spinning  TODAY'S TREATMENT: 12/25/2023 Activity Comments  Seated ankle pumps, LAQ, marching, 2 x 10 reps Prior to standing  Forward/back walking at single parallel bar, 2 min   Sidestepping 2 min at parallel bars   Alt step taps 2 x 10, BUE support>1 UE support   Feet apart EO/EC with head turns/heads Light/intermittent UE upport  Nustep, Level 4, 4 extremities x 6 minutes SPM >80-90 for flexibility, strength   HOME EXERCISE PROGRAM: Access Code: [email or web address] URL: [email or web address] Date: 12/25/2023 Prepared by: [facility] [place] - Outpatient  Rehab - [facility]  Exercises - Side Stepping with Counter Support  - 1-2 x daily - 7 x weekly - 1 sets - 2-3 min  hold -  Backward Walking with Counter Support  - 1-2 x daily - 7 x weekly - 1 sets - 2-3 min hold - Alternating Step Taps with Counter Support  - 1 x daily - 7 x weekly - 2 sets - 10 reps - Corner Balance Feet Apart: Eyes Closed With Head Turns  - 1 x daily - 7 x weekly - 3 sets - 5 reps   PATIENT EDUCATION: Education details: Ways to offset drops in blood pressure and reinforced that pt will follow up with Dr. Arlene Ben on BP medications; pt has questions about gastroparesis and stomach musculature limitations (advised pt to follow up with MD); initiated HEP and provided handouts for the above ex Person educated: Patient Education method: Explanation, Demonstration, and Handouts Education comprehension: verbalized understanding, returned demonstration, and needs further education   Ways to offset your blood pressure fluctuations (lowering):  1)  Talk to Dr. Arlene Ben  2)  Stay well hydrated through your day with plenty of water  3)  If you sit for long periods-do several exercises for your legs before you stand up  -ankle pumps  -seated march  -seated leg kicks  4)  When you transition between lying down to sitting up or sitting to standing, take a moment to "get your bearings" to get set before you begin to move  5)  ??Consider asking MD about abdominal binder or compression socks ??  ---------------------------------------------------- Note: Objective measures were completed at Evaluation unless otherwise noted.  DIAGNOSTIC FINDINGS: NA for this episode  COGNITION: Overall cognitive status: Within functional limits for tasks assessed   SENSATION: Light touch: WFL  EDEMA: Pt reports some BLE swelling and being monitored by MD   POSTURE: rounded shoulders and forward head  VITALS:   142/90, HR 71 bpm Seated   144/79  HR 74 bpm  Standing    LOWER EXTREMITY ROM:   AROM WFL   LOWER EXTREMITY MMT:  Grossly tested at least 4/5 BLEs   TRANSFERS: Sit to stand: Modified independence   Assistive device utilized: None     Stand to sit: Modified independence  Assistive device utilized: None     *Reports she needs to use hands most times to push up to stand from chairs at home*  GAIT:   FUNCTIONAL TESTS:  5 times sit to stand: 15.25 sec Timed up and go (TUG): 19.35 sed with walking pole (compared to 14 sec last bout of therapy)* 10 meter walk test: 17.98 sec (1.82 ft/sec) *compared to 2.52 ft/sec last bout of therapy* Mini-BESTest: 13/24 (decreased from 24/28 from last bout of therapy) TUG cognitive: 20.07 sec (compared to 15 sec last bout of therapy)* 3 M backwards walk:  11.94 sec                                                                                                                              TREATMENT DATE: 12/20/2023    PATIENT EDUCATION: Education details:  Initial eval results, POC; encouraged general activity and walking with 4WW; preparation for neurologist visit to discuss symptoms/dizziness/slowed mobility  Person educated: Patient Education method: Explanation Education comprehension: verbalized understanding   GOALS: Goals reviewed with patient? Yes  SHORT TERM GOALS: Target date: 01/18/2024  Pt will be independent with HEP for improved balance, strength, gait. Baseline: Goal status: INITIAL  2.  Pt will improve 5x sit<>stand to less than or equal to 12.5 sec to demonstrate improved functional strength and transfer efficiency. Baseline: 15.25 sec Goal status: INITIAL  3.  Pt will improve TUG score to less than or equal to 15 sec for decreased fall risk. Baseline: 19 sec Goal status: INITIAL  LONG TERM GOALS: Target date: 02/15/2024  Pt will be independent with HEP for improved balance, strength, gait. Baseline:  Goal status: INITIAL  2.  Pt will improve MiniBESTest score to at least 20/28 to decrease fall risk. Baseline: 13/28 Goal status: INITIAL  3.  Pt will improve gait velocity to at least 2.3 ft/sec for improved gait  efficiency and safety.  Baseline: 1.82 ft/sec Goal status: INITIAL  4.  Pt will verbalize plans for continued community fitness to maximize gains made in PT. Baseline:  Goal status: INITIAL   ASSESSMENT:  CLINICAL IMPRESSION: Pt presents today with reports of following up with Dr. Winferd Hatter and starting back on Entacapone .  Skilled PT session focused on educating pt in ways she can manage neurogenic hypotension (did not remeasure orthostatic BP meds today, as she had just done this at MD office last week).  Also worked on initial balance and mobility exercise to initiate HEP Pt needs UE for optimal balance and confidence, increased RUE and RLE tremors noted with increased balance challenge.  Pt seems pleased with exercises provided for HEP today; no reports of dizziness while doing standing exercises. Pt will continue to benefit from skilled PT towards goals for improved functional mobility and decreased fall risk.    OBJECTIVE IMPAIRMENTS: Abnormal gait, decreased balance, decreased mobility, difficulty walking, decreased strength, and dizziness.   ACTIVITY LIMITATIONS: standing, transfers, and locomotion level  PARTICIPATION LIMITATIONS: meal prep, cleaning, laundry, driving, shopping, and community activity  PERSONAL FACTORS: 3+ comorbidities: see above PMH; increasing dizziness/unsteadiness in past 1-2 months  are also affecting patient's functional outcome.   REHAB POTENTIAL: Good  CLINICAL DECISION MAKING: Evolving/moderate complexity  EVALUATION COMPLEXITY: Moderate  PLAN:  PT FREQUENCY: 2x/week  PT DURATION: 8 weeks plus eval visit  PLANNED INTERVENTIONS: 97750- Physical Performance Testing, 97110-Therapeutic exercises, 97530- Therapeutic activity, 97112- Neuromuscular re-education, 97535- Self Care, [id number]- Manual therapy, [phone]- Gait training, Patient/Family education, Balance training, and Vestibular training  PLAN FOR NEXT SESSION: Assess true orthostatic BP measures (did  not do4/22/2025, as this this was done at the MD office last week); vestibular assessment for dizziness (did not perform, as pt does not report any spinning sensation, more unsteadiness).  Review and progress HEP (need to include compliant surface balance for vestibular system retraining   Jordon Kristiansen W., PT 12/25/2023, 3:12 PM  [facility] at [facility] [address] [place], [address] [place], [place] [address] Phone # [phone] Fax # [phone]

## 2023-12-25 NOTE — Progress Notes (Signed)
Called and lm for pt tcb. 

## 2023-12-26 ENCOUNTER — Telehealth (INDEPENDENT_AMBULATORY_CARE_PROVIDER_SITE_OTHER): Payer: Medicare Other | Admitting: Psychiatry

## 2023-12-26 ENCOUNTER — Encounter: Payer: Self-pay | Admitting: Psychiatry

## 2023-12-26 ENCOUNTER — Telehealth: Payer: Self-pay

## 2023-12-26 DIAGNOSIS — Z130 Encounter for screening for diseases of the blood and blood-forming organs and certain disorders involving the immune mechanism: Secondary | ICD-10-CM | POA: Diagnosis not present

## 2023-12-26 DIAGNOSIS — R5382 Chronic fatigue, unspecified: Secondary | ICD-10-CM

## 2023-12-26 DIAGNOSIS — G4700 Insomnia, unspecified: Secondary | ICD-10-CM

## 2023-12-26 DIAGNOSIS — F419 Anxiety disorder, unspecified: Secondary | ICD-10-CM

## 2023-12-26 DIAGNOSIS — F3341 Major depressive disorder, recurrent, in partial remission: Secondary | ICD-10-CM | POA: Diagnosis not present

## 2023-12-26 NOTE — Telephone Encounter (Signed)
 Called and spoke with pt and message from Dr. Arlene Ben has been relayed.  Copied from CRM [id number]. Topic: Clinical - Medical Advice >> Dec 25, 2023  2:59 PM Kristin [name] wrote: Reason for CRM: patient had a note from the office to contact them, however I did not see anything to relay. She thinks her neurologist was supposed to talk to Dr. Arlene Ben. I let her know I would pass along the information that she called and have someone give her a call back. Please advise.

## 2023-12-26 NOTE — Patient Instructions (Signed)
 Continue duloxetine  20 mg daily  Continue lexapro  20 mg daily  Obtain labs- ferritin Next appointment: 6/18 at 11:30

## 2023-12-27 ENCOUNTER — Encounter: Payer: Self-pay | Admitting: Physical Therapy

## 2023-12-27 ENCOUNTER — Ambulatory Visit: Admitting: Physical Therapy

## 2023-12-27 DIAGNOSIS — R2681 Unsteadiness on feet: Secondary | ICD-10-CM

## 2023-12-27 DIAGNOSIS — R2689 Other abnormalities of gait and mobility: Secondary | ICD-10-CM | POA: Diagnosis not present

## 2023-12-27 DIAGNOSIS — R42 Dizziness and giddiness: Secondary | ICD-10-CM | POA: Diagnosis not present

## 2023-12-27 DIAGNOSIS — R293 Abnormal posture: Secondary | ICD-10-CM | POA: Diagnosis not present

## 2023-12-27 DIAGNOSIS — R29818 Other symptoms and signs involving the nervous system: Secondary | ICD-10-CM | POA: Diagnosis not present

## 2023-12-27 DIAGNOSIS — M6281 Muscle weakness (generalized): Secondary | ICD-10-CM | POA: Diagnosis not present

## 2023-12-27 NOTE — Therapy (Signed)
 OUTPATIENT PHYSICAL THERAPY NEURO TREATMENT   Patient Name: Kristin [name] MRN: [id number] DOB:09/24/1950, 73 y.o., female Today's Date: 12/27/2023   PCP:    Almira Jaeger, MD   REFERRING PROVIDER: Shirline Dover, DO   END OF SESSION:  PT End of Session - 12/27/23 0758     Visit Number 3    Number of Visits 17    Date for PT Re-Evaluation 02/15/24    Authorization Type Medicare/Aetna    Progress Note Due on Visit 10    PT Start Time 0802    PT Stop Time 0842    PT Time Calculation (min) 40 min    Equipment Utilized During Treatment Gait belt    Activity Tolerance Patient tolerated treatment well    Behavior During Therapy [facility] for tasks assessed/performed;Anxious               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   Hyperlipidemia    Hypertension    Infertility, female    Low back pain 10/17/2007   Qualifier: Diagnosis of  By: Larrie Po MD, Wilmon Hashimoto    Measles as a child   Obesity    Osteoarthritis    Parkinson disease (HCC)    Plantar fasciitis of left foot 06/19/2012   Preventative health care 12/19/2015   Rheumatoid arthritis (HCC) 01/08/2018   Rosacea 10/05/2014   Swallowing difficulty    Urinary frequency 02/25/2013   Visual floaters 05/11/2014   Past Surgical History:  Procedure Laterality Date   ABDOMINAL HYSTERECTOMY  2006   total   esophageal     stretching   HERNIA REPAIR N/A 2022    hiatal hernia   laporoscopy     LEFT HEART CATH AND CORONARY ANGIOGRAPHY N/A 06/02/2019   Procedure: LEFT HEART CATH AND CORONARY ANGIOGRAPHY;  Surgeon: Lucendia Rusk, MD;  Location: [facility] IN[facility];  Service: Cardiovascular;  Laterality: N/A;   TONSILLECTOMY     TOTAL HIP ARTHROPLASTY Right 2010   UPPER GASTROINTESTINAL ENDOSCOPY     wisdom teeth extracted     Patient Active Problem List   Diagnosis Date Noted   Vitamin B 12 deficiency 05/30/2021   Anemia 03/16/2020   Coronary artery disease involving native coronary artery of native heart without angina pectoris 06/12/2019   Diastolic dysfunction 05/16/2019   Rheumatoid arthritis (HCC) 01/08/2018   Hand pain, right 10/15/2017   Vitamin D  deficiency 12/20/2016   Prediabetes 12/20/2016   Shortness of breath on exertion 11/27/2016   Morbid obesity (HCC)    Constipation 12/07/2015   BCC (basal cell carcinoma of skin) 10/05/2014   Rosacea 10/05/2014   Parkinson's disease (HCC) 05/11/2014   Screening for cervical cancer 07/17/2012   Foot pain, bilateral 06/19/2012   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   Depression with anxiety 11/02/2009   DYSPHAGIA PHARYNGOESOPHAGEAL  PHASE 11/25/2008   Lumbar back pain with radiculopathy affecting lower extremity 10/17/2007   Essential hypertension 07/05/2007   Osteoarthritis 07/05/2007   Hyperlipidemia 06/26/2007   H/O: iron  deficiency anemia 05/08/2007   Hiatal hernia with GERD 05/08/2007    ONSET DATE: 11/29/2023 (MD referral)  REFERRING DIAG: G20.A1 (ICD-10-CM) - Parkinson's disease without dyskinesia or fluctuating manifestations (HCC)   THERAPY DIAG:  Unsteadiness on feet  Other abnormalities of gait and mobility  Muscle weakness (generalized)  Rationale for Evaluation and Treatment: Rehabilitation  SUBJECTIVE:                                                                                                                                                                                              SUBJECTIVE STATEMENT: Talked with PCP and he told me to cut the BP meds in half.  I'm taking BP measures twice/day sitting and standing.  Didn't go to the [employer] class yesterday-really think it is my nerves. Pt accompanied by: self  PERTINENT HISTORY: Parkinson's disease, gastroparesis, anxiety, hx of chest pain, knee OA, RA  PAIN:  Are you having pain? No  PRECAUTIONS: Fall  RED FLAGS: None   WEIGHT BEARING RESTRICTIONS: No  FALLS: Has patient fallen in last 6 months? No  LIVING ENVIRONMENT: Lives with: lives with their spouse Lives in: House/apartment Stairs: stairs in home with rails Has following equipment at home: Otho Blitz - 4 wheeled and bilateral walking poles  PLOF: Independent, Independent with household mobility with device, and Independent with community mobility with device  PATIENT GOALS: To try to combat the slowness and work on the dizziness.  OBJECTIVE:   Do see therapist about nervousness and we are going through exercises and strategies to work through this  TODAY'S TREATMENT: 12/27/2023 Activity Comments  Review of HEP: Sidestepping x 2 min Forward/back walking x 2 min Alt step taps 2 x 10 reps Balance feet apart EC head turns/nods   Cues for increased arm swing Cues for foot clearance  Forward walk along counter, counting steps and take less (larger) steps 8 steps, able to get to 6 steps with larger steps and arm swing  Feet together EO/EC head turns/nods  Partial tandem EO head turns/nods, then EC head steady 30 sec  Light UE support  Stager stance rocking forward/back with added arm swing 1 UE support  Gait with walking pole, with cues for increased arm swing and step length, 50 ft x 6 reps       HOME EXERCISE PROGRAM: Access Code: [email or web address] URL: [email or web address] Date: 12/27/2023 Prepared by: [facility] - Outpatient  Rehab - [facility]  Program  Notes Pick 1-2 paths in your house that you would  typically walk.  Count steps in your typical walking pattern.  THEN:  Walk this path with FEWER STEPS is you your goal.  Exercises - Side Stepping with Counter Support  - 1-2 x daily - 7 x weekly - 1 sets - 2-3 min hold - Backward Walking with Counter Support  - 1-2 x daily - 7 x weekly - 1 sets - 2-3 min hold - Alternating Step Taps with Counter Support  - 1 x daily - 7 x weekly - 2 sets - 10 reps - Corner Balance Feet Apart: Eyes Closed With Head Turns  - 1 x daily - 7 x weekly - 3 sets - 5 reps - Stride Stance Weight Shift  - 1 x daily - 7 x weekly - 2 sets - 10 reps     PATIENT EDUCATION: Education details: Updates to HEP-see above Person educated: Patient Education method: Programmer, multimedia, Demonstration, and Handouts Education comprehension: verbalized understanding, returned demonstration, and needs further education   Ways to offset your blood pressure fluctuations (lowering):  1)  Talk to Dr. Arlene Ben  2)  Stay well hydrated through your day with plenty of water  3)  If you sit for long periods-do several exercises for your legs before you stand up  -ankle pumps  -seated march  -seated leg kicks  4)  When you transition between lying down to sitting up or sitting to standing, take a moment to "get your bearings" to get set before you begin to move  5)  ??Consider asking MD about abdominal binder or compression socks ??  ---------------------------------------------------- Note: Objective measures were completed at Evaluation unless otherwise noted.  DIAGNOSTIC FINDINGS: NA for this episode  COGNITION: Overall cognitive status: Within functional limits for tasks assessed   SENSATION: Light touch: WFL  EDEMA: Pt reports some BLE swelling and being monitored by MD   POSTURE: rounded shoulders and forward head  VITALS:   142/90, HR 71 bpm Seated   144/79  HR 74 bpm  Standing    LOWER EXTREMITY ROM:   AROM WFL   LOWER EXTREMITY MMT:  Grossly tested at least 4/5  BLEs   TRANSFERS: Sit to stand: Modified independence  Assistive device utilized: None     Stand to sit: Modified independence  Assistive device utilized: None     *Reports she needs to use hands most times to push up to stand from chairs at home*  GAIT:   FUNCTIONAL TESTS:  5 times sit to stand: 15.25 sec Timed up and go (TUG): 19.35 sed with walking pole (compared to 14 sec last bout of therapy)* 10 meter walk test: 17.98 sec (1.82 ft/sec) *compared to 2.52 ft/sec last bout of therapy* Mini-BESTest: 13/24 (decreased from 24/28 from last bout of therapy) TUG cognitive: 20.07 sec (compared to 15 sec last bout of therapy)* 3 M backwards walk:  11.94 sec  TREATMENT DATE: 12/20/2023    PATIENT EDUCATION: Education details: Initial eval results, POC; encouraged general activity and walking with 4WW; preparation for neurologist visit to discuss symptoms/dizziness/slowed mobility  Person educated: Patient Education method: Explanation Education comprehension: verbalized understanding   GOALS: Goals reviewed with patient? Yes  SHORT TERM GOALS: Target date: 01/18/2024  Pt will be independent with HEP for improved balance, strength, gait. Baseline: Goal status: IN PROGRESS  2.  Pt will improve 5x sit<>stand to less than or equal to 12.5 sec to demonstrate improved functional strength and transfer efficiency. Baseline: 15.25 sec Goal status: IN PROGRESS  3.  Pt will improve TUG score to less than or equal to 15 sec for decreased fall risk. Baseline: 19 sec Goal status: IN PROGRESS  LONG TERM GOALS: Target date: 02/15/2024  Pt will be independent with HEP for improved balance, strength, gait. Baseline:  Goal status: IN PROGRESS  2.  Pt will improve MiniBESTest score to at least 20/28 to decrease fall risk. Baseline: 13/28 Goal status: IN PROGRESS  3.   Pt will improve gait velocity to at least 2.3 ft/sec for improved gait efficiency and safety.  Baseline: 1.82 ft/sec Goal status: IN PROGRESS  4.  Pt will verbalize plans for continued community fitness to maximize gains made in PT. Baseline:  Goal status: IN PROGRESS   ASSESSMENT:  CLINICAL IMPRESSION: Pt presents today with no new complaints. Skilled PT session focused on review and update to HEP.  Pt taking noticeably shorter steps with counter balance exercises and gait; she needs cues for "taking fewer steps", which she responds well to doing this. She reports feelings of anxiety and internal tremors throughout session; no overt LOB, though. Pt will continue to benefit from skilled PT towards goals for improved functional mobility and decreased fall risk.   OBJECTIVE IMPAIRMENTS: Abnormal gait, decreased balance, decreased mobility, difficulty walking, decreased strength, and dizziness.   ACTIVITY LIMITATIONS: standing, transfers, and locomotion level  PARTICIPATION LIMITATIONS: meal prep, cleaning, laundry, driving, shopping, and community activity  PERSONAL FACTORS: 3+ comorbidities: see above PMH; increasing dizziness/unsteadiness in past 1-2 months  are also affecting patient's functional outcome.   REHAB POTENTIAL: Good  CLINICAL DECISION MAKING: Evolving/moderate complexity  EVALUATION COMPLEXITY: Moderate  PLAN:  PT FREQUENCY: 2x/week  PT DURATION: 8 weeks plus eval visit  PLANNED INTERVENTIONS: 97750- Physical Performance Testing, 97110-Therapeutic exercises, 97530- Therapeutic activity, 97112- Neuromuscular re-education, 97535- Self Care, 60454- Manual therapy, [phone]- Gait training, Patient/Family education, Balance training, and Vestibular training  PLAN FOR NEXT SESSION: Assess true orthostatic BP measures (did not do 12/27/2023, as this this was done at the MD office last week and pt is taking sitting/standing measures at home);   Review and progress HEP (need to  include compliant surface balance for vestibular system retraining)   Mancel Lardizabal W., PT 12/27/2023, 8:44 AM  [facility] Outpatient Rehab at [facility] [address] [place], [address] [place], [place] [address] Phone # [phone] Fax # [phone]

## 2024-01-01 ENCOUNTER — Ambulatory Visit: Admitting: Physical Therapy

## 2024-01-01 ENCOUNTER — Encounter: Payer: Self-pay | Admitting: Physical Therapy

## 2024-01-01 DIAGNOSIS — R42 Dizziness and giddiness: Secondary | ICD-10-CM | POA: Diagnosis not present

## 2024-01-01 DIAGNOSIS — M6281 Muscle weakness (generalized): Secondary | ICD-10-CM | POA: Diagnosis not present

## 2024-01-01 DIAGNOSIS — R29818 Other symptoms and signs involving the nervous system: Secondary | ICD-10-CM | POA: Diagnosis not present

## 2024-01-01 DIAGNOSIS — R2689 Other abnormalities of gait and mobility: Secondary | ICD-10-CM | POA: Diagnosis not present

## 2024-01-01 DIAGNOSIS — R2681 Unsteadiness on feet: Secondary | ICD-10-CM | POA: Diagnosis not present

## 2024-01-01 DIAGNOSIS — R293 Abnormal posture: Secondary | ICD-10-CM | POA: Diagnosis not present

## 2024-01-01 NOTE — Therapy (Signed)
 OUTPATIENT PHYSICAL THERAPY NEURO TREATMENT   Patient Name: Kristin [name] MRN: [id number] DOB:06-16-1951, 73 y.o., female Today's Date: 01/01/2024   PCP:    Almira Jaeger, MD   REFERRING PROVIDER: Shirline Dover, DO   END OF SESSION:  PT End of Session - 01/01/24 0759     Visit Number 4    Number of Visits 17    Date for PT Re-Evaluation 02/15/24    Authorization Type Medicare/Aetna    Progress Note Due on Visit 10    PT Start Time 0802    PT Stop Time 0843    PT Time Calculation (min) 41 min    Equipment Utilized During Treatment Gait belt    Activity Tolerance Patient tolerated treatment well    Behavior During Therapy [facility] for tasks assessed/performed;Anxious                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   Hyperlipidemia    Hypertension    Infertility, female    Low back pain 10/17/2007   Qualifier: Diagnosis of  By: Larrie Po MD, Wilmon Hashimoto    Measles as a child   Obesity    Osteoarthritis    Parkinson disease (HCC)    Plantar fasciitis of left foot 06/19/2012   Preventative health care 12/19/2015   Rheumatoid arthritis (HCC) 01/08/2018   Rosacea 10/05/2014   Swallowing difficulty    Urinary frequency 02/25/2013   Visual floaters 05/11/2014   Past Surgical History:  Procedure Laterality Date   ABDOMINAL HYSTERECTOMY  2006   total   esophageal     stretching   HERNIA REPAIR N/A 2022    hiatal hernia   laporoscopy     LEFT HEART CATH AND CORONARY ANGIOGRAPHY N/A 06/02/2019   Procedure: LEFT HEART CATH AND CORONARY ANGIOGRAPHY;  Surgeon: Lucendia Rusk, MD;  Location: [facility] INVASIVE CV LAB;  Service: Cardiovascular;  Laterality: N/A;   TONSILLECTOMY     TOTAL HIP ARTHROPLASTY Right 2010   UPPER GASTROINTESTINAL ENDOSCOPY     wisdom teeth extracted     Patient Active Problem List   Diagnosis Date Noted   Vitamin B 12 deficiency 05/30/2021   Anemia 03/16/2020   Coronary artery disease involving native coronary artery of native heart without angina pectoris 06/12/2019   Diastolic dysfunction 05/16/2019   Rheumatoid arthritis (HCC) 01/08/2018   Hand pain, right 10/15/2017   Vitamin D  deficiency 12/20/2016   Prediabetes 12/20/2016   Shortness of breath on exertion 11/27/2016   Morbid obesity (HCC)    Constipation 12/07/2015   BCC (basal cell carcinoma of skin) 10/05/2014   Rosacea 10/05/2014   Parkinson's disease (HCC) 05/11/2014   Screening for cervical cancer 07/17/2012   Foot pain, bilateral 06/19/2012   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   Depression with anxiety 11/02/2009   DYSPHAGIA  PHARYNGOESOPHAGEAL PHASE 11/25/2008   Lumbar back pain with radiculopathy affecting lower extremity 10/17/2007   Essential hypertension 07/05/2007   Osteoarthritis 07/05/2007   Hyperlipidemia 06/26/2007   H/O: iron  deficiency anemia 05/08/2007   Hiatal hernia with GERD 05/08/2007    ONSET DATE: 11/29/2023 (MD referral)  REFERRING DIAG: G20.A1 (ICD-10-CM) - Parkinson's disease without dyskinesia or fluctuating manifestations (HCC)   THERAPY DIAG:  Unsteadiness on feet  Other abnormalities of gait and mobility  Muscle weakness (generalized)  Rationale for Evaluation and Treatment: Rehabilitation  SUBJECTIVE:                                                                                                                                                                                              SUBJECTIVE STATEMENT: I feel like the arm swing has helped.  Feel like I'm more anxious sometimes in the mornings.  I do feel like therapy is giving me more hope than I had previously.  Went back to to chair yoga on Fridays. Pt accompanied by: self  PERTINENT HISTORY: Parkinson's disease, gastroparesis, anxiety, hx of chest pain, knee OA, RA  PAIN:  Are you having pain? No  PRECAUTIONS: Fall  RED FLAGS: None   WEIGHT BEARING RESTRICTIONS: No  FALLS: Has patient fallen in last 6 months? No  LIVING ENVIRONMENT: Lives with: lives with their spouse Lives in: House/apartment Stairs: stairs in home with rails Has following equipment at home: Otho Blitz - 4 wheeled and bilateral walking poles  PLOF: Independent, Independent with household mobility with device, and Independent with community mobility with device  PATIENT GOALS: To try to combat the slowness and work on the dizziness.  OBJECTIVE:     TODAY'S TREATMENT: 01/01/2024 Activity Comments  Vitals 135/65, HR 72   NuStep, Level 2-4, extremities x 8 minutes Aerobic warm-up, with SPM >100  Reviewed full HEP-see below Min cues for technique-arm swing, intensity for RLE motion  Feet together EO/EC head turns/head nods, 2 x 5 Light UE support  Alternating heel raises 2 x 10   Rock and reach with dedicated visual focus on targets   Trunk rotation reach across with visual targets 2 x 5 reps        HOME EXERCISE PROGRAM: Access Code: [email or web address] URL: [email or web address] Date: 01/01/2024 Prepared by: [facility] - Outpatient  Rehab - [facility]  Program Notes Pick 1-2 paths in your house that you would typically walk.  Count steps in your typical walking pattern.  THEN:  Walk this path with FEWER STEPS is you your goal.  Exercises - Side Stepping with Counter Support  - 1-2 x daily - 7  x weekly - 1 sets - 2-3 min hold - Backward Walking with Counter Support  - 1-2 x daily - 7 x weekly - 1 sets  - 2-3 min hold - Alternating Step Taps with Counter Support  - 1 x daily - 7 x weekly - 2 sets - 10 reps - Corner Balance Feet Apart: Eyes Closed With Head Turns  - 1 x daily - 7 x weekly - 3 sets - 5 reps - Stride Stance Weight Shift  - 1 x daily - 7 x weekly - 2 sets - 10 reps - Alternating Heel Raises  - 1 x daily - 7 x weekly - 2 sets - 10 reps    PATIENT EDUCATION: Education details: Update to HEP-see above Person educated: Patient Education method: Programmer, multimedia, Demonstration, and Handouts Education comprehension: verbalized understanding, returned demonstration, and needs further education   Ways to offset your blood pressure fluctuations (lowering):  1)  Talk to Dr. Arlene Ben  2)  Stay well hydrated through your day with plenty of water  3)  If you sit for long periods-do several exercises for your legs before you stand up  -ankle pumps  -seated march  -seated leg kicks  4)  When you transition between lying down to sitting up or sitting to standing, take a moment to "get your bearings" to get set before you begin to move  5)  ??Consider asking MD about abdominal binder or compression socks ??  ---------------------------------------------------- Note: Objective measures were completed at Evaluation unless otherwise noted.  DIAGNOSTIC FINDINGS: NA for this episode  COGNITION: Overall cognitive status: Within functional limits for tasks assessed   SENSATION: Light touch: WFL  EDEMA: Pt reports some BLE swelling and being monitored by MD   POSTURE: rounded shoulders and forward head  VITALS:   142/90, HR 71 bpm Seated   144/79  HR 74 bpm  Standing    LOWER EXTREMITY ROM:   AROM WFL   LOWER EXTREMITY MMT:  Grossly tested at least 4/5 BLEs   TRANSFERS: Sit to stand: Modified independence  Assistive device utilized: None     Stand to sit: Modified independence  Assistive device utilized: None     *Reports she needs to use hands most times to push up to stand  from chairs at home*  GAIT:   FUNCTIONAL TESTS:  5 times sit to stand: 15.25 sec Timed up and go (TUG): 19.35 sed with walking pole (compared to 14 sec last bout of therapy)* 10 meter walk test: 17.98 sec (1.82 ft/sec) *compared to 2.52 ft/sec last bout of therapy* Mini-BESTest: 13/24 (decreased from 24/28 from last bout of therapy) TUG cognitive: 20.07 sec (compared to 15 sec last bout of therapy)* 3 M backwards walk:  11.94 sec                                                                                                                              TREATMENT DATE: 12/20/2023  PATIENT EDUCATION: Education details: Initial eval results, POC; encouraged general activity and walking with 4WW; preparation for neurologist visit to discuss symptoms/dizziness/slowed mobility  Person educated: Patient Education method: Explanation Education comprehension: verbalized understanding   GOALS: Goals reviewed with patient? Yes  SHORT TERM GOALS: Target date: 01/18/2024  Pt will be independent with HEP for improved balance, strength, gait. Baseline: Goal status: IN PROGRESS  2.  Pt will improve 5x sit<>stand to less than or equal to 12.5 sec to demonstrate improved functional strength and transfer efficiency. Baseline: 15.25 sec Goal status: IN PROGRESS  3.  Pt will improve TUG score to less than or equal to 15 sec for decreased fall risk. Baseline: 19 sec Goal status: IN PROGRESS  LONG TERM GOALS: Target date: 02/15/2024  Pt will be independent with HEP for improved balance, strength, gait. Baseline:  Goal status: IN PROGRESS  2.  Pt will improve MiniBESTest score to at least 20/28 to decrease fall risk. Baseline: 13/28 Goal status: IN PROGRESS  3.  Pt will improve gait velocity to at least 2.3 ft/sec for improved gait efficiency and safety.  Baseline: 1.82 ft/sec Goal status: IN PROGRESS  4.  Pt will verbalize plans for continued community fitness to maximize gains made in  PT. Baseline:  Goal status: IN PROGRESS   ASSESSMENT:  CLINICAL IMPRESSION:  Pt presents today with no new complaints. Skilled PT session focused on aerobic warm up followed by HEP review and continued balance work.  Incorporated both static and dynamic balance with head motions, and pt tolerates with mild unsteadiness, but no overt LOB.  Educated pt in increased intensity of RUE and RLE work, to "recalibrate" to achieve normal movements patterns, and pt responds to these cues well.    She will continue to benefit from skilled PT towards goals for improved functional mobility and decreased fall risk.   OBJECTIVE IMPAIRMENTS: Abnormal gait, decreased balance, decreased mobility, difficulty walking, decreased strength, and dizziness.   ACTIVITY LIMITATIONS: standing, transfers, and locomotion level  PARTICIPATION LIMITATIONS: meal prep, cleaning, laundry, driving, shopping, and community activity  PERSONAL FACTORS: 3+ comorbidities: see above PMH; increasing dizziness/unsteadiness in past 1-2 months  are also affecting patient's functional outcome.   REHAB POTENTIAL: Good  CLINICAL DECISION MAKING: Evolving/moderate complexity  EVALUATION COMPLEXITY: Moderate  PLAN:  PT FREQUENCY: 2x/week  PT DURATION: 8 weeks plus eval visit  PLANNED INTERVENTIONS: 97750- Physical Performance Testing, 97110-Therapeutic exercises, 97530- Therapeutic activity, 97112- Neuromuscular re-education, 97535- Self Care, 96295- Manual therapy, [phone]- Gait training, Patient/Family education, Balance training, and Vestibular training  PLAN FOR NEXT SESSION:   Review and progress HEP (need to include compliant surface balance for vestibular system retraining), static and dynamic balance with head motions   Trayvon Trumbull W., PT 01/01/2024, 8:45 AM  [facility] Outpatient Rehab at [facility] [address] [place], [place] [address] Phone # [phone] Fax # [phone]

## 2024-01-03 ENCOUNTER — Ambulatory Visit (INDEPENDENT_AMBULATORY_CARE_PROVIDER_SITE_OTHER): Admitting: Family Medicine

## 2024-01-03 ENCOUNTER — Encounter: Payer: Self-pay | Admitting: Family Medicine

## 2024-01-03 ENCOUNTER — Ambulatory Visit: Admitting: Physical Therapy

## 2024-01-03 VITALS — BP 120/82 | HR 78 | Temp 97.0°F | Ht 64.0 in | Wt 234.2 lb

## 2024-01-03 DIAGNOSIS — R7303 Prediabetes: Secondary | ICD-10-CM | POA: Diagnosis not present

## 2024-01-03 DIAGNOSIS — E782 Mixed hyperlipidemia: Secondary | ICD-10-CM | POA: Diagnosis not present

## 2024-01-03 DIAGNOSIS — I10 Essential (primary) hypertension: Secondary | ICD-10-CM

## 2024-01-03 DIAGNOSIS — E538 Deficiency of other specified B group vitamins: Secondary | ICD-10-CM

## 2024-01-03 DIAGNOSIS — I951 Orthostatic hypotension: Secondary | ICD-10-CM | POA: Diagnosis not present

## 2024-01-03 DIAGNOSIS — E559 Vitamin D deficiency, unspecified: Secondary | ICD-10-CM | POA: Diagnosis not present

## 2024-01-03 DIAGNOSIS — Z131 Encounter for screening for diabetes mellitus: Secondary | ICD-10-CM | POA: Diagnosis not present

## 2024-01-03 LAB — CBC WITH DIFFERENTIAL/PLATELET
Basophils Absolute: 0 10*3/uL (ref 0.0–0.1)
Basophils Relative: 0.4 % (ref 0.0–3.0)
Eosinophils Absolute: 0.1 10*3/uL (ref 0.0–0.7)
Eosinophils Relative: 1.6 % (ref 0.0–5.0)
HCT: 38.3 % (ref 36.0–46.0)
Hemoglobin: 12.8 g/dL (ref 12.0–15.0)
Lymphocytes Relative: 22.4 % (ref 12.0–46.0)
Lymphs Abs: 1.5 10*3/uL (ref 0.7–4.0)
MCHC: 33.3 g/dL (ref 30.0–36.0)
MCV: 89.9 fl (ref 78.0–100.0)
Monocytes Absolute: 0.6 10*3/uL (ref 0.1–1.0)
Monocytes Relative: 9.2 % (ref 3.0–12.0)
Neutro Abs: 4.4 10*3/uL (ref 1.4–7.7)
Neutrophils Relative %: 66.4 % (ref 43.0–77.0)
Platelets: 211 10*3/uL (ref 150.0–400.0)
RBC: 4.26 Mil/uL (ref 3.87–5.11)
RDW: 14.2 % (ref 11.5–15.5)
WBC: 6.6 10*3/uL (ref 4.0–10.5)

## 2024-01-03 LAB — LIPID PANEL
Cholesterol: 133 mg/dL (ref 0–200)
HDL: 54.2 mg/dL (ref >39.00)
LDL Cholesterol: 61 mg/dL (ref 0–99)
NonHDL: 78.87
Total CHOL/HDL Ratio: 2
Triglycerides: 88 mg/dL (ref 0.0–149.0)
VLDL: 17.6 mg/dL (ref 0.0–40.0)

## 2024-01-03 LAB — VITAMIN B12: Vitamin B-12: 834 pg/mL (ref 211–911)

## 2024-01-03 LAB — COMPREHENSIVE METABOLIC PANEL WITH GFR
ALT: 12 U/L (ref 0–35)
AST: 24 U/L (ref 0–37)
Albumin: 4.2 g/dL (ref 3.5–5.2)
Alkaline Phosphatase: 94 U/L (ref 39–117)
BUN: 16 mg/dL (ref 6–23)
CO2: 27 meq/L (ref 19–32)
Calcium: 9.7 mg/dL (ref 8.4–10.5)
Chloride: 105 meq/L (ref 96–112)
Creatinine, Ser: 0.66 mg/dL (ref 0.40–1.20)
GFR: 86.88 mL/min (ref >60.00)
Glucose, Bld: 99 mg/dL (ref 70–99)
Potassium: 3.8 meq/L (ref 3.5–5.1)
Sodium: 140 meq/L (ref 135–145)
Total Bilirubin: 0.7 mg/dL (ref 0.2–1.2)
Total Protein: 6.9 g/dL (ref 6.0–8.3)

## 2024-01-03 LAB — HEMOGLOBIN A1C: Hgb A1c MFr Bld: 6.2 % (ref 4.6–6.5)

## 2024-01-03 LAB — VITAMIN D 25 HYDROXY (VIT D DEFICIENCY, FRACTURES): VITD: 37.46 ng/mL (ref 30.00–100.00)

## 2024-01-03 NOTE — Patient Instructions (Addendum)
-  I think we have space to trial off the losartan  25 mg completely and have her update me in 1-2 weeks with how she is feeling. Keep pushing fluids and change position slowly.   -id still like to keep average if possible <135/85  Please stop by lab before you go If you have mychart- we will send your results within 3 business days of us  receiving them.  If you do not have mychart- we will call you about results within 5 business days of us  receiving them.  *please also note that you will see labs on mychart as soon as they post. I will later go in and write notes on them- will say "notes from Dr. Arlene Ben"   Recommended follow up: Return for next already scheduled visit or sooner if needed.

## 2024-01-03 NOTE — Progress Notes (Signed)
 Phone [phone] In person visit   Subjective:   Kristin [name] is a 73 y.o. year old very pleasant female patient who presents for/with See problem oriented charting Chief Complaint  Patient presents with   Dizziness    Pt c/o dizziness x1 month she has seen her nuerologist but thinks it may be due to bp.   Hypertension   Past Medical History-  Patient Active Problem List   Diagnosis Date Noted   Coronary artery disease involving native coronary artery of native heart without angina pectoris 06/12/2019    Priority: High   Rheumatoid arthritis (HCC) 01/08/2018    Priority: High   Constipation 12/07/2015    Priority: High   Parkinson's disease (HCC) 05/11/2014    Priority: High   Vitamin B 12 deficiency 05/30/2021    Priority: Medium    Vitamin D  deficiency 12/20/2016    Priority: Medium    Prediabetes 12/20/2016    Priority: Medium    Shortness of breath on exertion 11/27/2016    Priority: Medium    Morbid obesity (HCC)     Priority: Medium    Depression with anxiety 11/02/2009    Priority: Medium    Essential hypertension 07/05/2007    Priority: Medium    Osteoarthritis 07/05/2007    Priority: Medium    Hyperlipidemia 06/26/2007    Priority: Medium    H/O: iron  deficiency anemia 05/08/2007    Priority: Medium    Hiatal hernia with GERD 05/08/2007    Priority: Medium    Anemia 03/16/2020    Priority: Low   Diastolic dysfunction 05/16/2019    Priority: Low   BCC (basal cell carcinoma of skin) 10/05/2014    Priority: Low   Rosacea 10/05/2014    Priority: Low   Screening for cervical cancer 07/17/2012    Priority: Low   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   Foot pain, bilateral 06/19/2012    Priority: 1.   Lumbar back pain with radiculopathy affecting lower extremity 10/17/2007    Priority: 1.    Medications- reviewed and  updated Current Outpatient Medications  Medication Sig Dispense Refill   carbidopa -levodopa  (SINEMET  CR) 50-200 MG tablet TAKE 1 TABLET BY MOUTH EVERYDAY AT BEDTIME 90 tablet 0   carbidopa -levodopa  (SINEMET  IR) 25-100 MG tablet TAKE 2 TABLETS BY MOUTH AT 7AM, 2TABLETS AT 11AM, 1 TABLET AT 4PM 450 tablet 2   carvedilol  (COREG ) 3.125 MG tablet TAKE 1 TABLET BY MOUTH TWICE A DAY WITH FOOD 180 tablet 3   DULoxetine  (CYMBALTA ) 20 MG capsule Take 1 capsule (20 mg total) by mouth daily. 90 capsule 0   entacapone  (COMTAN ) 200 MG tablet Take 1 tablet (200 mg total) by mouth 3 (three) times daily. 270 tablet 1   escitalopram  (LEXAPRO ) 20 MG tablet Take 1 tablet (20 mg total) by mouth daily. 90 tablet 3   losartan  (COZAAR ) 50 MG tablet TAKE 1 TABLET BY MOUTH EVERY DAY (Patient taking differently: Take 25 mg by mouth daily.) 90 tablet 3   omeprazole  (PRILOSEC) 20 MG capsule TAKE 1 CAPSULE BY MOUTH 2 TIMES DAILY AS NEEDED. 180 capsule 1   Plecanatide  (TRULANCE ) 3 MG TABS Take 1 tablet (3 mg total) by mouth daily. 30 tablet 3   polyethylene glycol (MIRALAX  / GLYCOLAX ) 17 g packet Take 17 g by mouth daily as needed for mild constipation.  pramipexole  (MIRAPEX ) 0.5 MG tablet TAKE 1 TABLET BY MOUTH 3 TIMES DAILY. 270 tablet 0   rosuvastatin  (CRESTOR ) 10 MG tablet TAKE 1 TABLET BY MOUTH ON MONDAYS,WEDNESDAYS, AND FRIDAYS 39 tablet 3   Vitamin D , Ergocalciferol , (DRISDOL ) 1.25 MG (50000 UNIT) CAPS capsule TAKE 1 CAPSULE (50,000 UNITS TOTAL) BY MOUTH EVERY 7 (SEVEN) DAYS 12 capsule 1   No current facility-administered medications for this visit.     Objective:  BP 120/82   Pulse 78   Temp (!) 97 F (36.1 C)   Ht 5\' 4"  (1.626 m)   Wt 234 lb 3.2 oz (106.2 kg)   SpO2 94%   BMI 40.20 kg/m  Gen: NAD, resting comfortably CV: RRR no murmurs rubs or gallops Lungs: CTAB no crackles, wheeze, rhonchi Ext: 1+ edema but was trace to 1+ last visit Skin: warm, dry Neuro: walks with cane- known parkinsons     Assessment and Plan   #Hypertension  # Dizziness- orthostatic it appears S: From Dr. Mela Spinner note " Dizziness             -she was orthostatic in the office but her BP's were too high to start midodrine/florinef/droxidopa.  In addition, I wonder if the coreg /losarten can be decreased and solve the issue but that is out of my field.  I will ask her pcp if it can be decreased. " -We have corresponded and already decreased her losartan  to 25 mg (coreg  3.125 mg twice daily)-she reports after this change  -still with position changes is getting the same issue- has not improved much yet. Years of issues but more prominent lately.  -also taking coreg  3. 125 mg twice daily  -slightly more swollen legs lately. Also not sleeping well A/P: blood pressure still very well controlled in office and for most part at home- some variability though with range from 104/69 to as high as 153/79 but came down on repeat to 114/61 with average  avg 125.9 76.3  Over 20 readings -I think we have space to trial off the losartan  25 mg completely and have her update me in 1-2 weeks with how she is feeling. Keep pushing fluids and change position slowly.   - reports in physical therapy right now- doing this for parkinsons and slowing -with it being position related - I doubt this is stroke related nor did Dr. Winferd Hatter  #Parkinson's disease with RLS-Follows with Dr. Winferd Hatter  S: Medication: Carbidopa  levodopa  (Sinemet  dose sustained-release and instant release). -Also on entacapone  to avoid wearing off of medication - Also on mirapex  0.5 mg TID (increased ocd tendencies and shopping so trying to cut back) A/P: some slowing and working with physical therapy otherwise stable on medications   #Rheumatoid arthritis S: Medication: none -prior Methotrexate injections - trial 2024 but now off A/P: was told by rheumatology recently in remission which is great news- 2 years of good reports   #CAD - minimal plaque on cath  2020 #hyperlipidemia S: Medication:Rosuvastatin  10 mg- 3 days a week, coreg  3.125 mg twice daily Lab Results  Component Value Date   CHOL 147 03/28/2023   HDL 63.80 03/28/2023   LDLCALC 60 03/28/2023   LDLDIRECT 161.5 10/07/2008   TRIG 114.0 03/28/2023   CHOLHDL 2 03/28/2023  A/P: #s looked really good last year- update today continue current medications  -some edema but no shortness of breath- if this worsens could consider further workup like BNP or echocardiogram   # Depression with anxiety- family history and mother bipolar as  well- working with psychiatry in 2025 S: Medication:Lexapro  20 mg, Cymbalta  30 mg--> 20 mg - in the past alprazolam  0.25 mg A/P: they want to further lower dose but hesitant until we stabilize dizziness  # GERD S:Medication:  omeprazole  20 mg A/P: stable- continue current medicines    #Vitamin D  deficiency S: Medication: 50,000 units weekly started June 2023- has been on high dose weekly still Last vitamin D  Lab Results  Component Value Date   VD25OH 38.55 03/28/2023  A/P: hopefully stable- update vitamin D  today. Continue current meds for now    # B12 deficiency S: Current treatment/medication (oral vs. IM): oral B12 4x a week  A/P: check with next labs   # Hyperglycemia/insulin  resistance/prediabetes- a1c 6.0 in 2023 S:  Medication: none Lab Results  Component Value Date   HGBA1C 6.0 03/28/2023   HGBA1C 6.1 09/27/2022   HGBA1C 5.9 01/12/2022  A/P: prediabetes noted and now due for recheck- continue efforts for healthy eating and regular exercise   Recommended follow up: Return for next already scheduled visit or sooner if needed. Future Appointments  Date Time Provider Department Center  01/03/2024  2:45 PM Kelsey Patricia, PT [facility]  01/08/2024  9:30 AM Angelyn Kennel, PT [facility]  01/10/2024  9:30 AM Angelyn Kennel, PT [facility]  01/15/2024  8:00 AM Kelsey Patricia, PT [facility]  01/17/2024  8:00 AM Angelyn Kennel, PT [facility]  02/19/2024  8:20 AM Graciella Lavender, PA [facility]  02/20/2024  2:00 PM Todd Fossa, MD [facility] None  03/13/2024 10:40 AM [facility] ANNUAL WELLNESS VISIT 1 [facility]  03/19/2024  8:15 AM Tat, Von Grumbling, DO [facility] None  05/13/2024 10:00 AM Arlene Ben Saverio Curling, MD [facility]    Lab/Order associations:   ICD-10-CM   1. Essential hypertension  I10 Comprehensive metabolic panel with GFR    CBC with Differential/Platelet    Lipid panel    2. Mixed hyperlipidemia  E78.2 Comprehensive metabolic panel with GFR    CBC with Differential/Platelet    Lipid panel    3. Prediabetes  R73.03 Hemoglobin A1c    4. Vitamin B 12 deficiency  E53.8 Vitamin B12    5. Orthostatic hypotension  I95.1     6. Screening for diabetes mellitus  Z13.1 Hemoglobin A1c    7. Vitamin D  deficiency  E55.9 VITAMIN D  25 Hydroxy (Vit-D Deficiency, Fractures)      No orders of the defined types were placed in this encounter.   Return precautions advised.  Clarisa Crooked, MD

## 2024-01-08 ENCOUNTER — Other Ambulatory Visit: Payer: Self-pay | Admitting: Obstetrics and Gynecology

## 2024-01-08 ENCOUNTER — Ambulatory Visit: Admitting: Physical Therapy

## 2024-01-08 DIAGNOSIS — Z1231 Encounter for screening mammogram for malignant neoplasm of breast: Secondary | ICD-10-CM

## 2024-01-10 ENCOUNTER — Ambulatory Visit: Admitting: Physical Therapy

## 2024-01-15 ENCOUNTER — Encounter: Payer: Self-pay | Admitting: Physical Therapy

## 2024-01-15 ENCOUNTER — Ambulatory Visit: Attending: Neurology | Admitting: Physical Therapy

## 2024-01-15 DIAGNOSIS — R42 Dizziness and giddiness: Secondary | ICD-10-CM | POA: Diagnosis not present

## 2024-01-15 DIAGNOSIS — R293 Abnormal posture: Secondary | ICD-10-CM | POA: Insufficient documentation

## 2024-01-15 DIAGNOSIS — R2681 Unsteadiness on feet: Secondary | ICD-10-CM | POA: Diagnosis not present

## 2024-01-15 DIAGNOSIS — R2689 Other abnormalities of gait and mobility: Secondary | ICD-10-CM | POA: Diagnosis not present

## 2024-01-15 DIAGNOSIS — M6281 Muscle weakness (generalized): Secondary | ICD-10-CM | POA: Diagnosis not present

## 2024-01-15 NOTE — Therapy (Signed)
 OUTPATIENT PHYSICAL THERAPY NEURO TREATMENT   Patient Name: Kristin [name] MRN: [id number] DOB:Dec 30, 1950, 73 y.o., female Today's Date: 01/15/2024   PCP:    Almira Jaeger, MD   REFERRING PROVIDER: Shirline Dover, DO   END OF SESSION:  PT End of Session - 01/15/24 0807     Visit Number 5    Number of Visits 17    Date for PT Re-Evaluation 02/15/24    Authorization Type Medicare/[employer]    Progress Note Due on Visit 10    PT Start Time 0805    PT Stop Time 0845    PT Time Calculation (min) 40 min    Equipment Utilized During Treatment Gait belt    Activity Tolerance Patient tolerated treatment well    Behavior During Therapy WFL for tasks assessed/performed;Anxious                 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   Hyperlipidemia    Hypertension    Infertility, female    Low back pain 10/17/2007   Qualifier: Diagnosis of  By: Larrie Po MD, Wilmon Hashimoto    Measles as a child   Obesity    Osteoarthritis    Parkinson disease (HCC)    Plantar fasciitis of left foot 06/19/2012   Preventative health care 12/19/2015   Rheumatoid arthritis (HCC) 01/08/2018   Rosacea 10/05/2014   Swallowing difficulty    Urinary frequency 02/25/2013   Visual floaters 05/11/2014   Past Surgical History:  Procedure Laterality Date   ABDOMINAL HYSTERECTOMY  2006   total   esophageal     stretching   HERNIA REPAIR N/A 2022    hiatal hernia   laporoscopy     LEFT HEART CATH AND CORONARY ANGIOGRAPHY N/A 06/02/2019   Procedure: LEFT HEART CATH AND CORONARY ANGIOGRAPHY;  Surgeon: Lucendia Rusk, MD;  Location: [facility];  Service: Cardiovascular;  Laterality: N/A;   TONSILLECTOMY     TOTAL HIP ARTHROPLASTY Right 2010   UPPER GASTROINTESTINAL ENDOSCOPY     wisdom teeth extracted     Patient Active Problem List   Diagnosis Date Noted   Vitamin B 12 deficiency 05/30/2021   Anemia 03/16/2020   Coronary artery disease involving native coronary artery of native heart without angina pectoris 06/12/2019   Diastolic dysfunction 05/16/2019   Rheumatoid arthritis (HCC) 01/08/2018   Hand pain, right 10/15/2017   Vitamin D  deficiency 12/20/2016   Prediabetes 12/20/2016   Shortness of breath on exertion 11/27/2016   Morbid obesity (HCC)    Constipation 12/07/2015   BCC (basal cell carcinoma of skin) 10/05/2014   Rosacea 10/05/2014   Parkinson's disease (HCC) 05/11/2014   Screening for cervical cancer 07/17/2012   Foot pain, bilateral 06/19/2012   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   Depression with anxiety 11/02/2009  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   Lumbar back pain with radiculopathy affecting lower extremity 10/17/2007   Essential hypertension 07/05/2007   Osteoarthritis 07/05/2007   Hyperlipidemia 06/26/2007   H/O: iron  deficiency anemia 05/08/2007   Hiatal hernia with GERD 05/08/2007    ONSET DATE: 11/29/2023 (MD referral)  REFERRING DIAG: G20.A1 (ICD-10-CM) - Parkinson's disease without dyskinesia or fluctuating manifestations (HCC)   THERAPY DIAG:  Unsteadiness on feet  Other abnormalities of gait and mobility  Muscle weakness (generalized)  Abnormal posture  Dizziness and giddiness  Rationale for Evaluation and Treatment: Rehabilitation  SUBJECTIVE:                                                                                                                                                                                              SUBJECTIVE STATEMENT: Went back to see Dr. Arlene Ben several weeks ago about the dizziness.  He took away a medication; I have been taking my BP measures; all other tests are normal.  Not sleeping well.  When I get ready in the mornings, I get so sweaty, hot, anxious.  I do feel good with exercising. Pt accompanied by: self  PERTINENT HISTORY: Parkinson's disease, gastroparesis, anxiety, hx of chest pain, knee OA, RA  PAIN:  Are you having pain? No  PRECAUTIONS: Fall  RED FLAGS: None   WEIGHT BEARING RESTRICTIONS: No  FALLS: Has patient fallen in last 6 months? No  LIVING ENVIRONMENT: Lives with: lives with their spouse Lives in: House/apartment Stairs: stairs in home with rails Has following equipment at home: Otho Blitz - 4 wheeled and bilateral walking poles  PLOF: Independent, Independent with household mobility with device, and Independent with community mobility with device  PATIENT GOALS: To try to combat the slowness and work on the dizziness.  OBJECTIVE:     TODAY'S TREATMENT: 01/15/2024 Activity Comments  Orthostatic BP measures: Supine after 5 min Standing after 1 min Standing after 3 min  109/66 [place] standing132/84HR80 Cleared, 132/81, HR 79  Alternating heel raises Good return demo  Stagger stance rock and reach with arm swing Cues for coordinated arm swing  Sidestepping With added coordinated arm motions, pt feels good with this  Lateral rock and reach, look at hand targets Trunk rotation, rock and reach, look at hand targets Slowed eye movts to target, feels off balance           HOME EXERCISE PROGRAM: Access Code: [email or web address] URL: [email or web address] Date: 01/01/2024 Prepared by: [facility] - Outpatient  Rehab - [facility]  Program Notes Pick 1-2 paths in your house that you would typically walk.  Count steps in your typical  walking pattern.  THEN:  Walk this  path with FEWER STEPS is you your goal.  Exercises - Side Stepping with Counter Support  - 1-2 x daily - 7 x weekly - 1 sets - 2-3 min hold - Backward Walking with Counter Support  - 1-2 x daily - 7 x weekly - 1 sets - 2-3 min hold - Alternating Step Taps with Counter Support  - 1 x daily - 7 x weekly - 2 sets - 10 reps - Corner Balance Feet Apart: Eyes Closed With Head Turns  - 1 x daily - 7 x weekly - 3 sets - 5 reps - Stride Stance Weight Shift  - 1 x daily - 7 x weekly - 2 sets - 10 reps - Alternating Heel Raises  - 1 x daily - 7 x weekly - 2 sets - 10 reps    PATIENT EDUCATION: Education details: 01/15/2024 Reviewed HEP, reviewed education/follow up about BP with Dr. Arlene Ben, educated about orthostatic BP measures taken today Person educated: Patient Education method: Explanation, Demonstration, and Handouts Education comprehension: verbalized understanding, returned demonstration, and needs further education   Ways to offset your blood pressure fluctuations (lowering):  1)  Talk to Dr. Arlene Ben  2)  Stay well hydrated through your day with plenty of water  3)  If you sit for long periods-do several exercises for your legs before you stand up  -ankle pumps  -seated march  -seated leg kicks  4)  When you transition between lying down to sitting up or sitting to standing, take a moment to "get your bearings" to get set before you begin to move  5)  ??Consider asking MD about abdominal binder or compression socks ??-Probably not needed, given BP measures 01/15/2024  ---------------------------------------------------- Note: Objective measures were completed at Evaluation unless otherwise noted.  DIAGNOSTIC FINDINGS: NA for this episode  COGNITION: Overall cognitive status: Within functional limits for tasks assessed   SENSATION: Light touch: WFL  EDEMA: Pt reports some BLE swelling and being monitored by MD   POSTURE: rounded shoulders and forward head  VITALS:   142/90,  HR 71 bpm Seated   144/79  HR 74 bpm  Standing    LOWER EXTREMITY ROM:   AROM WFL   LOWER EXTREMITY MMT:  Grossly tested at least 4/5 BLEs   TRANSFERS: Sit to stand: Modified independence  Assistive device utilized: None     Stand to sit: Modified independence  Assistive device utilized: None     *Reports she needs to use hands most times to push up to stand from chairs at home*  GAIT:   FUNCTIONAL TESTS:  5 times sit to stand: 15.25 sec Timed up and go (TUG): 19.35 sed with walking pole (compared to 14 sec last bout of therapy)* 10 meter walk test: 17.98 sec (1.82 ft/sec) *compared to 2.52 ft/sec last bout of therapy* Mini-BESTest: 13/24 (decreased from 24/28 from last bout of therapy) TUG cognitive: 20.07 sec (compared to 15 sec last bout of therapy)* 3 M backwards walk:  11.94 sec  TREATMENT DATE: 12/20/2023    PATIENT EDUCATION: Education details: Initial eval results, POC; encouraged general activity and walking with 4WW; preparation for neurologist visit to discuss symptoms/dizziness/slowed mobility  Person educated: Patient Education method: Explanation Education comprehension: verbalized understanding   GOALS: Goals reviewed with patient? Yes  SHORT TERM GOALS: Target date: 01/18/2024  Pt will be independent with HEP for improved balance, strength, gait. Baseline: Goal status: MET, 01/15/2024  2.  Pt will improve 5x sit<>stand to less than or equal to 12.5 sec to demonstrate improved functional strength and transfer efficiency. Baseline: 15.25 sec Goal status: IN PROGRESS  3.  Pt will improve TUG score to less than or equal to 15 sec for decreased fall risk. Baseline: 19 sec Goal status: IN PROGRESS  LONG TERM GOALS: Target date: 02/15/2024  Pt will be independent with HEP for improved balance, strength, gait. Baseline:  Goal status:  IN PROGRESS  2.  Pt will improve MiniBESTest score to at least 20/28 to decrease fall risk. Baseline: 13/28 Goal status: IN PROGRESS  3.  Pt will improve gait velocity to at least 2.3 ft/sec for improved gait efficiency and safety.  Baseline: 1.82 ft/sec Goal status: IN PROGRESS  4.  Pt will verbalize plans for continued community fitness to maximize gains made in PT. Baseline:  Goal status: IN PROGRESS   ASSESSMENT:  CLINICAL IMPRESSION: Pt presents today with continued complaints of feelings of anxiety, but overall good BP measures at home. Skilled PT session focused on assessing true orthostatic BP measures (BP increases and levels out from supine>standing; however, with initial stand up from supine position, she reports unsteadiness, lightheaded feeling-no spinning).  Lightheaded feeling clears in <1 minute.  Reviewed HEP and pt is performing these well at home; worked to add coordinated UE motions, as well as head turns/eyes fixed on targets with movement.  Pt identifies this makes her feel off balance and unsteady; she will need further work on dynamic balance. Pt will continue to benefit from skilled PT towards goals for improved functional mobility and decreased fall risk.   OBJECTIVE IMPAIRMENTS: Abnormal gait, decreased balance, decreased mobility, difficulty walking, decreased strength, and dizziness.   ACTIVITY LIMITATIONS: standing, transfers, and locomotion level  PARTICIPATION LIMITATIONS: meal prep, cleaning, laundry, driving, shopping, and community activity  PERSONAL FACTORS: 3+ comorbidities: see above PMH; increasing dizziness/unsteadiness in past 1-2 months are also affecting patient's functional outcome.   REHAB POTENTIAL: Good  CLINICAL DECISION MAKING: Evolving/moderate complexity  EVALUATION COMPLEXITY: Moderate  PLAN:  PT FREQUENCY: 2x/week  PT DURATION: 8 weeks plus eval visit  PLANNED INTERVENTIONS: 97750- Physical Performance Testing,  97110-Therapeutic exercises, 97530- Therapeutic activity, [id number]- Neuromuscular re-education, 97535- Self Care, [id number]- Manual therapy, [phone]- Gait training, Patient/Family education, Balance training, and Vestibular training  PLAN FOR NEXT SESSION:   Check remaining STGs; ?Check MCTIB? (Pt did mention Dr. Arlene Ben may refer for full vestibular eval, so maybe need to proceed with this to further assess dizziness).  Review and progress HEP (need to include compliant surface balance for vestibular system retraining), static and dynamic balance with head motions   Shanard Treto W., PT 01/15/2024, 1:28 PM  [facility] at [facility] [address] [place], [address] [place], [place] [address] Phone # [phone] Fax # [phone]

## 2024-01-16 NOTE — Therapy (Signed)
 OUTPATIENT PHYSICAL THERAPY NEURO TREATMENT   Patient Name: Kristin [name] MRN: [id number] DOB:1951-01-21, 73 y.o., female Today's Date: 01/17/2024   PCP:    Almira Jaeger, MD   REFERRING PROVIDER: Shirline Dover, DO   END OF SESSION:  PT End of Session - 01/17/24 0846     Visit Number 6    Number of Visits 17    Date for PT Re-Evaluation 02/15/24    Authorization Type Medicare/[employer]    Progress Note Due on Visit 10    PT Start Time 0804    PT Stop Time 0844    PT Time Calculation (min) 40 min    Activity Tolerance Patient tolerated treatment well    Behavior During Therapy [facility] for tasks assessed/performed;Anxious                  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   Hyperlipidemia    Hypertension    Infertility, female    Low back pain 10/17/2007   Qualifier: Diagnosis of  By: Larrie Po MD, Wilmon Hashimoto    Measles as a child   Obesity    Osteoarthritis    Parkinson disease (HCC)    Plantar fasciitis of left foot 06/19/2012   Preventative health care 12/19/2015   Rheumatoid arthritis (HCC) 01/08/2018   Rosacea 10/05/2014   Swallowing difficulty    Urinary frequency 02/25/2013   Visual floaters 05/11/2014   Past Surgical History:  Procedure Laterality Date   ABDOMINAL HYSTERECTOMY  2006   total   esophageal     stretching   HERNIA REPAIR N/A 2022   hiatal hernia   laporoscopy     LEFT HEART  CATH AND CORONARY ANGIOGRAPHY N/A 06/02/2019   Procedure: LEFT HEART CATH AND CORONARY ANGIOGRAPHY;  Surgeon: Lucendia Rusk, MD;  Location: [facility];  Service: Cardiovascular;  Laterality: N/A;   TONSILLECTOMY     TOTAL HIP ARTHROPLASTY Right 2010   UPPER GASTROINTESTINAL ENDOSCOPY     wisdom teeth extracted     Patient Active Problem List   Diagnosis Date Noted   Vitamin B 12 deficiency 05/30/2021   Anemia 03/16/2020   Coronary artery disease involving native coronary artery of native heart without angina pectoris 06/12/2019   Diastolic dysfunction 05/16/2019   Rheumatoid arthritis (HCC) 01/08/2018   Hand pain, right 10/15/2017   Vitamin D  deficiency 12/20/2016   Prediabetes 12/20/2016   Shortness of breath on exertion 11/27/2016   Morbid obesity (HCC)    Constipation 12/07/2015   BCC (basal cell carcinoma of skin) 10/05/2014   Rosacea 10/05/2014   Parkinson's disease (HCC) 05/11/2014   Screening for cervical cancer 07/17/2012   Foot pain, bilateral 06/19/2012   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   Depression with anxiety 11/02/2009   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   Lumbar back  pain with radiculopathy affecting lower extremity 10/17/2007   Essential hypertension 07/05/2007   Osteoarthritis 07/05/2007   Hyperlipidemia 06/26/2007   H/O: iron  deficiency anemia 05/08/2007   Hiatal hernia with GERD 05/08/2007    ONSET DATE: 11/29/2023 (MD referral)  REFERRING DIAG: G20.A1 (ICD-10-CM) - Parkinson's disease without dyskinesia or fluctuating manifestations (HCC)   THERAPY DIAG:  Unsteadiness on feet  Other abnormalities of gait and mobility  Muscle weakness (generalized)  Abnormal posture  Dizziness and giddiness  Rationale for Evaluation and Treatment: Rehabilitation  SUBJECTIVE:                                                                                                                                                                                              SUBJECTIVE STATEMENT: Yesterday I went to the [facility] moves class. I got really dizzy and a couple of the women walked me to my car. Reports most dizziness with turning and reaching to look at her hand. Reports reading books has become harder especially with limited contrast or smaller font- notes blurred vision with this. Denies diplopia. Dizziness ongoing since April 7th- first noticed it when leaving after swimming. Noticing that when she sits down it gets better.    Pt accompanied by: self  PERTINENT HISTORY: Parkinson's disease, gastroparesis, anxiety, hx of chest pain, knee OA, RA  PAIN:  Are you having pain? No  PRECAUTIONS: Fall  RED FLAGS: None   WEIGHT BEARING RESTRICTIONS: No  FALLS: Has patient fallen in last 6 months? No  LIVING ENVIRONMENT: Lives with: lives with their spouse Lives in: House/apartment Stairs: stairs in home with rails Has following equipment at home: Otho Blitz - 4 wheeled and bilateral walking poles  PLOF: Independent, Independent with household mobility with device, and Independent with community mobility with device  PATIENT GOALS: To try to combat the slowness and work on the dizziness.  OBJECTIVE:     TODAY'S TREATMENT: 01/17/24 Activity Comments  5xSTS  13.08 sec with posterior LOB into chair on last rep  TUG 16.19 sec without AD                 VESTIBULAR ASSESSMENT   GENERAL OBSERVATION: pt wears progressives    OCULOMOTOR EXAM: Ocular Alignment: Cover/Uncover Test: WNL Cover/Cross Cover Test: WNL   Ocular ROM: No Limitations   Spontaneous Nystagmus: absent   Gaze-Induced Nystagmus: absent   Smooth Pursuits: intact   Saccades: intact (c/o "tipping" and dizzy with horizontal direction"   Convergence/Divergence: 24 cm ; L convergence insufficiency    VESTIBULAR - OCULAR REFLEX:    Slow VOR: Normal; c/o dizziness horizontal  VOR Cancellation: Normal   Head-Impulse Test: possible +R; difficult to accurately  test d/t guarding       POSITIONAL TESTING:  Right Roll Test: negative Left Roll Test: negative   Right Sidelying: negative  Left Sidelying: negative     HOME EXERCISE PROGRAM: Access Code: [email or web address] URL: [email or web address] Date: 01/17/2024 Prepared by: [facility] - Outpatient  Rehab - [facility]  Program Notes Pick 1-2 paths in your house that you would typically walk.  Count steps in your typical walking pattern.  THEN:  Walk this path with FEWER STEPS is you your goal.  Exercises - Side Stepping with Counter Support  - 1-2 x daily - 7 x weekly - 1 sets - 2-3 min hold - Backward Walking with Counter Support  - 1-2 x daily - 7 x weekly - 1 sets - 2-3 min hold - Alternating Step Taps with Counter Support  - 1 x daily - 7 x weekly - 2 sets - 10 reps - Corner Balance Feet Apart: Eyes Closed With Head Turns  - 1 x daily - 7 x weekly - 3 sets - 5 reps - Stride Stance Weight Shift  - 1 x daily - 7 x weekly - 2 sets - 10 reps - Alternating Heel Raises  - 1 x daily - 7 x weekly - 2 sets - 10 reps - Seated Gaze Stabilization with Head Rotation  - 1 x daily - 5 x weekly - 2-3 sets - 20-30 sec  hold     PATIENT EDUCATION: Education details: exam findings, HEP update, advised to discuss L convergene insufficiency with eye MD, edu on VOR and its importance with head turns Person educated: Patient Education method: Explanation, Demonstration, Tactile cues, Verbal cues, and Handouts Education comprehension: verbalized understanding and returned demonstration    Ways to offset your blood pressure fluctuations (lowering):  1)  Talk to Dr. Arlene Ben  2)  Stay well hydrated through your day with plenty of water  3)  If you sit for long periods-do several exercises for your legs before you stand up  -ankle pumps  -seated march  -seated leg kicks  4)  When you transition between lying down to sitting up or sitting to standing, take a moment to "get your bearings" to  get set before you begin to move  5)  ??Consider asking MD about abdominal binder or compression socks ??-Probably not needed, given BP measures 01/15/2024  ---------------------------------------------------- Note: Objective measures were completed at Evaluation unless otherwise noted.  DIAGNOSTIC FINDINGS: NA for this episode  COGNITION: Overall cognitive status: Within functional limits for tasks assessed   SENSATION: Light touch: WFL  EDEMA: Pt reports some BLE swelling and being monitored by MD   POSTURE: rounded shoulders and forward head  VITALS:   142/90, HR 71 bpm Seated   144/79  HR 74 bpm  Standing    LOWER EXTREMITY ROM:   AROM WFL   LOWER EXTREMITY MMT:  Grossly tested at least 4/5 BLEs   TRANSFERS: Sit to stand: Modified independence  Assistive device utilized: None     Stand to sit: Modified independence  Assistive device utilized: None     *Reports she needs to use hands most times to push up to stand from chairs at home*  GAIT:   FUNCTIONAL TESTS:  5 times sit to stand: 15.25 sec Timed up and go (TUG): 19.35 sed with walking pole (compared to 14 sec last bout of therapy)* 10 meter walk test: 17.98 sec (  1.82 ft/sec) *compared to 2.52 ft/sec last bout of therapy* Mini-BESTest: 13/24 (decreased from 24/28 from last bout of therapy) TUG cognitive: 20.07 sec (compared to 15 sec last bout of therapy)* 3 M backwards walk:  11.94 sec                                                                                                                              TREATMENT DATE: 12/20/2023    PATIENT EDUCATION: Education details: Initial eval results, POC; encouraged general activity and walking with 4WW; preparation for neurologist visit to discuss symptoms/dizziness/slowed mobility  Person educated: Patient Education method: Explanation Education comprehension: verbalized understanding   GOALS: Goals reviewed with patient? Yes  SHORT TERM GOALS: Target  date: 01/18/2024  Pt will be independent with HEP for improved balance, strength, gait. Baseline: Goal status: MET, 01/15/2024  2.  Pt will improve 5x sit<>stand to less than or equal to 12.5 sec to demonstrate improved functional strength and transfer efficiency. Baseline: 15.25 sec; 13.08 sec 01/17/24 Goal status: IN PROGRESS 01/17/24  3.  Pt will improve TUG score to less than or equal to 15 sec for decreased fall risk. Baseline: 19 sec; 16.19 sec 01/17/24 Goal status: IN PROGRESS 01/17/24  LONG TERM GOALS: Target date: 02/15/2024  Pt will be independent with HEP for improved balance, strength, gait. Baseline:  Goal status: IN PROGRESS  2.  Pt will improve MiniBESTest score to at least 20/28 to decrease fall risk. Baseline: 13/28 Goal status: IN PROGRESS  3.  Pt will improve gait velocity to at least 2.3 ft/sec for improved gait efficiency and safety.  Baseline: 1.82 ft/sec Goal status: IN PROGRESS  4.  Pt will verbalize plans for continued community fitness to maximize gains made in PT. Baseline:  Goal status: IN PROGRESS   ASSESSMENT:  CLINICAL IMPRESSION: Patient arrived to session with report of continued dizziness, particularly when turning and reaching to look at her hand. Reports blurred vision when reading small print, but denies diplopia. Patient's scores on 5xSTS and TUG have improved, however STGs are not yet met. Vestibular assessment revealed L convergence insufficiency, possibly contributing to reading difficulty and reproduction of symptoms with horizontal VOR. VOR was updated into HEP for practice. Patient reported understanding and without complaints upon leaving.   OBJECTIVE IMPAIRMENTS: Abnormal gait, decreased balance, decreased mobility, difficulty walking, decreased strength, and dizziness.   ACTIVITY LIMITATIONS: standing, transfers, and locomotion level  PARTICIPATION LIMITATIONS: meal prep, cleaning, laundry, driving, shopping, and community  activity  PERSONAL FACTORS: 3+ comorbidities: see above PMH; increasing dizziness/unsteadiness in past 1-2 months are also affecting patient's functional outcome.   REHAB POTENTIAL: Good  CLINICAL DECISION MAKING: Evolving/moderate complexity  EVALUATION COMPLEXITY: Moderate  PLAN:  PT FREQUENCY: 2x/week  PT DURATION: 8 weeks plus eval visit  PLANNED INTERVENTIONS: 97750- Physical Performance Testing, 97110-Therapeutic exercises, 97530- Therapeutic activity, [id number]- Neuromuscular re-education, 97535- Self Care, [id number]- Manual therapy, [phone]- Gait training, Patient/Family education, Balance training, and Vestibular training  PLAN FOR NEXT SESSION:   horizontal VOR, ?Check MCTIB?   Review and progress HEP (need to include compliant surface balance for vestibular system retraining), static and dynamic balance with head motions   Thaddeus Filippo, PT, DPT 01/17/24 8:50 AM  [facility] at [facility] [address] [place], [place] [address] Phone # [phone] Fax # [phone]

## 2024-01-17 ENCOUNTER — Encounter: Payer: Self-pay | Admitting: Physical Therapy

## 2024-01-17 ENCOUNTER — Ambulatory Visit: Admitting: Physical Therapy

## 2024-01-17 DIAGNOSIS — R293 Abnormal posture: Secondary | ICD-10-CM | POA: Diagnosis not present

## 2024-01-17 DIAGNOSIS — R42 Dizziness and giddiness: Secondary | ICD-10-CM

## 2024-01-17 DIAGNOSIS — R2689 Other abnormalities of gait and mobility: Secondary | ICD-10-CM | POA: Diagnosis not present

## 2024-01-17 DIAGNOSIS — K5909 Other constipation: Secondary | ICD-10-CM | POA: Diagnosis not present

## 2024-01-17 DIAGNOSIS — R2681 Unsteadiness on feet: Secondary | ICD-10-CM

## 2024-01-17 DIAGNOSIS — E66813 Obesity, class 3: Secondary | ICD-10-CM | POA: Diagnosis not present

## 2024-01-17 DIAGNOSIS — Z6841 Body Mass Index (BMI) 40.0 and over, adult: Secondary | ICD-10-CM | POA: Diagnosis not present

## 2024-01-17 DIAGNOSIS — M6281 Muscle weakness (generalized): Secondary | ICD-10-CM | POA: Diagnosis not present

## 2024-01-17 DIAGNOSIS — E65 Localized adiposity: Secondary | ICD-10-CM | POA: Diagnosis not present

## 2024-01-17 DIAGNOSIS — R7303 Prediabetes: Secondary | ICD-10-CM | POA: Diagnosis not present

## 2024-01-17 DIAGNOSIS — I10 Essential (primary) hypertension: Secondary | ICD-10-CM | POA: Diagnosis not present

## 2024-01-17 DIAGNOSIS — K3184 Gastroparesis: Secondary | ICD-10-CM | POA: Diagnosis not present

## 2024-01-22 ENCOUNTER — Encounter: Payer: Self-pay | Admitting: Physical Therapy

## 2024-01-22 ENCOUNTER — Ambulatory Visit: Admitting: Physical Therapy

## 2024-01-22 DIAGNOSIS — M6281 Muscle weakness (generalized): Secondary | ICD-10-CM | POA: Diagnosis not present

## 2024-01-22 DIAGNOSIS — R293 Abnormal posture: Secondary | ICD-10-CM | POA: Diagnosis not present

## 2024-01-22 DIAGNOSIS — R42 Dizziness and giddiness: Secondary | ICD-10-CM

## 2024-01-22 DIAGNOSIS — R2689 Other abnormalities of gait and mobility: Secondary | ICD-10-CM | POA: Diagnosis not present

## 2024-01-22 DIAGNOSIS — R2681 Unsteadiness on feet: Secondary | ICD-10-CM | POA: Diagnosis not present

## 2024-01-22 NOTE — Therapy (Signed)
 OUTPATIENT PHYSICAL THERAPY NEURO TREATMENT   Patient Name: Kristin [name] MRN: [id number] DOB:1951/06/17, 73 y.o., female Today's Date: 01/22/2024   PCP:    Almira Jaeger, MD   REFERRING PROVIDER: Shirline Dover, DO   END OF SESSION:  PT End of Session - 01/22/24 1356     Visit Number 7    Number of Visits 17    Date for PT Re-Evaluation 02/15/24    Authorization Type Medicare/[employer]    Progress Note Due on Visit 10    PT Start Time 1400    PT Stop Time 1442    PT Time Calculation (min) 42 min    Activity Tolerance Patient tolerated treatment well    Behavior During Therapy [facility] for tasks assessed/performed;Anxious                   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   Hyperlipidemia    Hypertension    Infertility, female    Low back pain 10/17/2007   Qualifier: Diagnosis of  By: Larrie Po MD, Wilmon Hashimoto    Measles as a child   Obesity    Osteoarthritis    Parkinson disease (HCC)    Plantar fasciitis of left foot 06/19/2012   Preventative health care 12/19/2015   Rheumatoid arthritis (HCC) 01/08/2018   Rosacea 10/05/2014   Swallowing difficulty    Urinary frequency 02/25/2013   Visual floaters 05/11/2014   Past Surgical History:  Procedure Laterality Date   ABDOMINAL HYSTERECTOMY  2006   total   esophageal     stretching   HERNIA REPAIR N/A 2022   hiatal hernia   laporoscopy     LEFT HEART  CATH AND CORONARY ANGIOGRAPHY N/A 06/02/2019   Procedure: LEFT HEART CATH AND CORONARY ANGIOGRAPHY;  Surgeon: Lucendia Rusk, MD;  Location: [facility];  Service: Cardiovascular;  Laterality: N/A;   TONSILLECTOMY     TOTAL HIP ARTHROPLASTY Right 2010   UPPER GASTROINTESTINAL ENDOSCOPY     wisdom teeth extracted     Patient Active Problem List   Diagnosis Date Noted   Vitamin B 12 deficiency 05/30/2021   Anemia 03/16/2020   Coronary artery disease involving native coronary artery of native heart without angina pectoris 06/12/2019   Diastolic dysfunction 05/16/2019   Rheumatoid arthritis (HCC) 01/08/2018   Hand pain, right 10/15/2017   Vitamin D  deficiency 12/20/2016   Prediabetes 12/20/2016   Shortness of breath on exertion 11/27/2016   Morbid obesity (HCC)    Constipation 12/07/2015   BCC (basal cell carcinoma of skin) 10/05/2014   Rosacea 10/05/2014   Parkinson's disease (HCC) 05/11/2014   Screening for cervical cancer 07/17/2012   Foot pain, bilateral 06/19/2012   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   Depression with anxiety 11/02/2009   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   Lumbar  back pain with radiculopathy affecting lower extremity 10/17/2007   Essential hypertension 07/05/2007   Osteoarthritis 07/05/2007   Hyperlipidemia 06/26/2007   H/O: iron  deficiency anemia 05/08/2007   Hiatal hernia with GERD 05/08/2007    ONSET DATE: 11/29/2023 (MD referral)  REFERRING DIAG: G20.A1 (ICD-10-CM) - Parkinson's disease without dyskinesia or fluctuating manifestations (HCC)   THERAPY DIAG:  Unsteadiness on feet  Other abnormalities of gait and mobility  Dizziness and giddiness  Rationale for Evaluation and Treatment: Rehabilitation  SUBJECTIVE:                                                                                                                                                                                             SUBJECTIVE STATEMENT: Feel good  that we are working on figuring out the dizziness.  Been working on the dizziness.  Pt accompanied by: self  PERTINENT HISTORY: Parkinson's disease, gastroparesis, anxiety, hx of chest pain, knee OA, RA  PAIN:  Are you having pain? No  PRECAUTIONS: Fall  RED FLAGS: None   WEIGHT BEARING RESTRICTIONS: No  FALLS: Has patient fallen in last 6 months? No  LIVING ENVIRONMENT: Lives with: lives with their spouse Lives in: House/apartment Stairs: stairs in home with rails Has following equipment at home: Otho Blitz - 4 wheeled and bilateral walking poles  PLOF: Independent, Independent with household mobility with device, and Independent with community mobility with device  PATIENT GOALS: To try to combat the slowness and work on the dizziness.  OBJECTIVE:    TODAY'S TREATMENT: 01/22/2024 Activity Comments  Seated VOR: 6-8 sec, 2 reps 20 sec with slowed pace, 6-8 sec Cues for technique Feeling of blurriness with not stopping in the middle  Feet apart/feet together EC + head turns/nods Requires light UE support for increased stability  Forward/back walking along counter No UE support  Forward/back marching along counter UE support          M-CTSIB  Condition 1: Firm Surface, EO 30 Sec, Mild Sway  Condition 2: Firm Surface, EC 30 Sec, Mild and Moderate Sway  Condition 3: Foam Surface, EO 30 Sec, Mild Sway  Condition 4: Foam Surface, EC 30 Sec, Moderate and Severe Sway   HOME EXERCISE PROGRAM: Access Code: [email or web address] URL: [email or web address] Date: 01/22/2024 Prepared by: [facility]  Program Notes Pick 1-2 paths in your house that you would typically walk.  Count steps in your typical walking pattern.  THEN:  Walk this path with FEWER STEPS is you your goal.  Exercises - Side Stepping with Counter Support  - 1-2 x daily - 7 x weekly -  1 sets - 2-3 min hold - Backward Walking with Counter Support  - 1-2 x daily - 7 x  weekly - 1 sets - 2-3 min hold - Alternating Step Taps with Counter Support  - 1 x daily - 7 x weekly - 2 sets - 10 reps - Corner Balance Feet Apart: Eyes Closed With Head Turns  - 1 x daily - 7 x weekly - 3 sets - 5 reps - Stride Stance Weight Shift  - 1 x daily - 7 x weekly - 2 sets - 10 reps - Alternating Heel Raises  - 1 x daily - 7 x weekly - 2 sets - 10 reps - Seated Gaze Stabilization with Head Rotation  - 1 x daily - 5 x weekly - 2-3 sets - 20-30 sec  hold - Feet Together Balance at [employer] Eyes Closed  - 1 x daily - 7 x weekly - 3 sets - 5 reps  -------------------------------------------------- TODAY'S TREATMENT: 01/17/24 Activity Comments  5xSTS  13.08 sec with posterior LOB into chair on last rep  TUG 16.19 sec without AD                 VESTIBULAR ASSESSMENT   GENERAL OBSERVATION: pt wears progressives    OCULOMOTOR EXAM: Ocular Alignment: Cover/Uncover Test: WNL Cover/Cross Cover Test: WNL   Ocular ROM: No Limitations   Spontaneous Nystagmus: absent   Gaze-Induced Nystagmus: absent   Smooth Pursuits: intact   Saccades: intact (c/o "tipping" and dizzy with horizontal direction"   Convergence/Divergence: 24 cm ; L convergence insufficiency    VESTIBULAR - OCULAR REFLEX:    Slow VOR: Normal; c/o dizziness horizontal    VOR Cancellation: Normal   Head-Impulse Test: possible +R; difficult to accurately test d/t guarding       POSITIONAL TESTING:  Right Roll Test: negative Left Roll Test: negative   Right Sidelying: negative  Left Sidelying: negative   ------------------------------------------    PATIENT EDUCATION: Education details: 01/22/2024:  Review HEP update, instructions on optimal VOR performance, rationale for balance/vestibular targeted exercises, with decreased vestibular system use on MCTSIB test Person educated: Patient Education method: Explanation, Demonstration, Tactile cues, Verbal cues, and Handouts Education comprehension: verbalized  understanding and returned demonstration    Ways to offset your blood pressure fluctuations (lowering):  1)  Talk to Dr. Arlene Ben  2)  Stay well hydrated through your day with plenty of water  3)  If you sit for long periods-do several exercises for your legs before you stand up  -ankle pumps  -seated march  -seated leg kicks  4)  When you transition between lying down to sitting up or sitting to standing, take a moment to "get your bearings" to get set before you begin to move  5)  ??Consider asking MD about abdominal binder or compression socks ??-Probably not needed, given BP measures 01/15/2024  ---------------------------------------------------- Note: Objective measures were completed at Evaluation unless otherwise noted.  DIAGNOSTIC FINDINGS: NA for this episode  COGNITION: Overall cognitive status: Within functional limits for tasks assessed   SENSATION: Light touch: WFL  EDEMA: Pt reports some BLE swelling and being monitored by MD   POSTURE: rounded shoulders and forward head  VITALS:   142/90, HR 71 bpm Seated   144/79  HR 74 bpm  Standing    LOWER EXTREMITY ROM:   AROM WFL   LOWER EXTREMITY MMT:  Grossly tested at least 4/5 BLEs   TRANSFERS: Sit to stand: Modified independence  Assistive device utilized:  None     Stand to sit: Modified independence  Assistive device utilized: None     *Reports she needs to use hands most times to push up to stand from chairs at home*  GAIT:   FUNCTIONAL TESTS:  5 times sit to stand: 15.25 sec Timed up and go (TUG): 19.35 sed with walking pole (compared to 14 sec last bout of therapy)* 10 meter walk test: 17.98 sec (1.82 ft/sec) *compared to 2.52 ft/sec last bout of therapy* Mini-BESTest: 13/24 (decreased from 24/28 from last bout of therapy) TUG cognitive: 20.07 sec (compared to 15 sec last bout of therapy)* 3 M backwards walk:  11.94 sec                                                                                                                               TREATMENT DATE: 12/20/2023    PATIENT EDUCATION: Education details: Initial eval results, POC; encouraged general activity and walking with 4WW; preparation for neurologist visit to discuss symptoms/dizziness/slowed mobility  Person educated: Patient Education method: Explanation Education comprehension: verbalized understanding   GOALS: Goals reviewed with patient? Yes  SHORT TERM GOALS: Target date: 01/18/2024  Pt will be independent with HEP for improved balance, strength, gait. Baseline: Goal status: MET, 01/15/2024  2.  Pt will improve 5x sit<>stand to less than or equal to 12.5 sec to demonstrate improved functional strength and transfer efficiency. Baseline: 15.25 sec; 13.08 sec 01/17/24 Goal status: IN PROGRESS 01/17/24  3.  Pt will improve TUG score to less than or equal to 15 sec for decreased fall risk. Baseline: 19 sec; 16.19 sec 01/17/24 Goal status: IN PROGRESS 01/17/24  LONG TERM GOALS: Target date: 02/15/2024  Pt will be independent with HEP for improved balance, strength, gait. Baseline:  Goal status: IN PROGRESS  2.  Pt will improve MiniBESTest score to at least 20/28 to decrease fall risk. Baseline: 13/28 Goal status: IN PROGRESS  3.  Pt will improve gait velocity to at least 2.3 ft/sec for improved gait efficiency and safety.  Baseline: 1.82 ft/sec Goal status: IN PROGRESS  4.  Pt will verbalize plans for continued community fitness to maximize gains made in PT. Baseline:  Goal status: IN PROGRESS   ASSESSMENT:  CLINICAL IMPRESSION: Pt presents today with no complaints, reports feeling better knowing that something particular is contributing to being off balance. Skilled PT session focused on reviewing HEP-pt needs cues for optimal/correct performance.  Assess mCTSIB testing, and pt has increased sway on Conditions 2 and particiulary condition 4.  She demo decreased vestibular system use for balance.   Explained rationale for how to address this and added to HEP static balance activities, EC and head motions.  She does prefer light UE support for optimal stability.  Pt will continue to benefit from skilled PT towards goals for improved functional mobility and decreased fall risk.   OBJECTIVE IMPAIRMENTS: Abnormal gait, decreased balance, decreased mobility, difficulty walking, decreased strength, and  dizziness.   ACTIVITY LIMITATIONS: standing, transfers, and locomotion level  PARTICIPATION LIMITATIONS: meal prep, cleaning, laundry, driving, shopping, and community activity  PERSONAL FACTORS: 3+ comorbidities: see above PMH; increasing dizziness/unsteadiness in past 1-2 months are also affecting patient's functional outcome.   REHAB POTENTIAL: Good  CLINICAL DECISION MAKING: Evolving/moderate complexity  EVALUATION COMPLEXITY: Moderate  PLAN:  PT FREQUENCY: 2x/week  PT DURATION: 8 weeks plus eval visit  PLANNED INTERVENTIONS: 97750- Physical Performance Testing, 97110-Therapeutic exercises, 97530- Therapeutic activity, 97112- Neuromuscular re-education, 97535- Self Care, 16109- Manual therapy, [phone]- Gait training, Patient/Family education, Balance training, and Vestibular training  PLAN FOR NEXT SESSION:   Continue horizontal VOR, pencil push-ups (ask about f/u with eye MD), Review and progress HEP (need to progress to compliant surface balance for vestibular system retraining), static and dynamic balance with head motions   Dessie Flow, PT 01/22/24 2:42 PM Phone: [phone] Fax: [phone]  [facility] at [facility] [address], [address] [place], [place] [address] Phone # [phone] Fax # [phone]

## 2024-01-24 ENCOUNTER — Ambulatory Visit: Admitting: Physical Therapy

## 2024-01-24 ENCOUNTER — Encounter: Payer: Self-pay | Admitting: Physical Therapy

## 2024-01-24 DIAGNOSIS — R2689 Other abnormalities of gait and mobility: Secondary | ICD-10-CM

## 2024-01-24 DIAGNOSIS — M6281 Muscle weakness (generalized): Secondary | ICD-10-CM | POA: Diagnosis not present

## 2024-01-24 DIAGNOSIS — R293 Abnormal posture: Secondary | ICD-10-CM | POA: Diagnosis not present

## 2024-01-24 DIAGNOSIS — R2681 Unsteadiness on feet: Secondary | ICD-10-CM | POA: Diagnosis not present

## 2024-01-24 DIAGNOSIS — R42 Dizziness and giddiness: Secondary | ICD-10-CM | POA: Diagnosis not present

## 2024-01-24 NOTE — Therapy (Signed)
 OUTPATIENT PHYSICAL THERAPY NEURO TREATMENT   Patient Name: Kristin [name] MRN: [id number] DOB:October 28, 1950, 73 y.o., female Today's Date: 01/24/2024   PCP:    Almira Jaeger, MD   REFERRING PROVIDER: Shirline Dover, DO   END OF SESSION:  PT End of Session - 01/24/24 1104     Visit Number 8    Number of Visits 17    Date for PT Re-Evaluation 02/15/24    Authorization Type Medicare/[employer]    Progress Note Due on Visit 10    PT Start Time 1105    PT Stop Time 1143    PT Time Calculation (min) 38 min    Activity Tolerance Patient tolerated treatment well    Behavior During Therapy [facility] for tasks assessed/performed;Anxious                    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   Hyperlipidemia    Hypertension    Infertility, female    Low back pain 10/17/2007   Qualifier: Diagnosis of  By: Larrie Po MD, Wilmon Hashimoto    Measles as a child   Obesity    Osteoarthritis    Parkinson disease (HCC)    Plantar fasciitis of left foot 06/19/2012   Preventative health care 12/19/2015   Rheumatoid arthritis (HCC) 01/08/2018   Rosacea 10/05/2014   Swallowing difficulty    Urinary frequency 02/25/2013   Visual floaters 05/11/2014   Past Surgical History:  Procedure Laterality Date   ABDOMINAL HYSTERECTOMY  2006   total   esophageal     stretching   HERNIA REPAIR N/A 2022   hiatal hernia   laporoscopy     LEFT  HEART CATH AND CORONARY ANGIOGRAPHY N/A 06/02/2019   Procedure: LEFT HEART CATH AND CORONARY ANGIOGRAPHY;  Surgeon: Lucendia Rusk, MD;  Location: [facility] INVASIVE CV LAB;  Service: Cardiovascular;  Laterality: N/A;   TONSILLECTOMY     TOTAL HIP ARTHROPLASTY Right 2010   UPPER GASTROINTESTINAL ENDOSCOPY     wisdom teeth extracted     Patient Active Problem List   Diagnosis Date Noted   Vitamin B 12 deficiency 05/30/2021   Anemia 03/16/2020   Coronary artery disease involving native coronary artery of native heart without angina pectoris 06/12/2019   Diastolic dysfunction 05/16/2019   Rheumatoid arthritis (HCC) 01/08/2018   Hand pain, right 10/15/2017   Vitamin D  deficiency 12/20/2016   Prediabetes 12/20/2016   Shortness of breath on exertion 11/27/2016   Morbid obesity (HCC)    Constipation 12/07/2015   BCC (basal cell carcinoma of skin) 10/05/2014   Rosacea 10/05/2014   Parkinson's disease (HCC) 05/11/2014   Screening for cervical cancer 07/17/2012   Foot pain, bilateral 06/19/2012   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   Depression with anxiety 11/02/2009   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  Lumbar back pain with radiculopathy affecting lower extremity 10/17/2007   Essential hypertension 07/05/2007   Osteoarthritis 07/05/2007   Hyperlipidemia 06/26/2007   H/O: iron  deficiency anemia 05/08/2007   Hiatal hernia with GERD 05/08/2007    ONSET DATE: 11/29/2023 (MD referral)  REFERRING DIAG: G20.A1 (ICD-10-CM) - Parkinson's disease without dyskinesia or fluctuating manifestations (HCC)   THERAPY DIAG:  Unsteadiness on feet  Other abnormalities of gait and mobility  Dizziness and giddiness  Rationale for Evaluation and Treatment: Rehabilitation  SUBJECTIVE:                                                                                                                                                                                             SUBJECTIVE STATEMENT: Feel  dizzier today.  Just comes and goes.  PWR! Moves class was good yesterday.  Have eye doctor appointment next week  Pt accompanied by: self  PERTINENT HISTORY: Parkinson's disease, gastroparesis, anxiety, hx of chest pain, knee OA, RA  PAIN:  Are you having pain? No  PRECAUTIONS: Fall  RED FLAGS: None   WEIGHT BEARING RESTRICTIONS: No  FALLS: Has patient fallen in last 6 months? No  LIVING ENVIRONMENT: Lives with: lives with their spouse Lives in: House/apartment Stairs: stairs in home with rails Has following equipment at home: Otho Blitz - 4 wheeled and bilateral walking poles  PLOF: Independent, Independent with household mobility with device, and Independent with community mobility with device  PATIENT GOALS: To try to combat the slowness and work on the dizziness.  OBJECTIVE:    TODAY'S TREATMENT: 01/24/2024 Activity Comments  Seated VOR 2 reps 9-10 sec    Pencil push-ups, 5 reps 27 cm at best where vision becomes double  Reviewed Romberg stance EC Head turns, head nods Good form, UE support, tried with slightly wider BOS  Feet apart EC head turns/nods   On Airex:  Feet apart/feet together EC head turns/nods Feet together EO 30 sec UE support  Heel/toe raises, 2 x 10 Feels tight with toe raises  Wall bumps, 2 x 10 EO 10 reps EC On solid surface  Gastroc stretch, 1 x 3 reps, 30 sec Each leg, good form with cues to keep heel down  Gastroc stretch RLE with foot prop at 4" step Too much of a stretc  Facing counter with UE support, stagger stance forward/back rocking, 10 reps each foot With targeted effort for increased RLE dorsiflexion   *Pt does report she feels fine, no dizziness at end of session*  HOME EXERCISE PROGRAM: Access Code: [email or web address] URL: [email or web address] Date: 01/24/2024 Prepared by: [facility] - Outpatient  Rehab - [facility]  Program Notes Pick 1-2 paths in your house that you would typically walk.  Count steps in your typical  walking pattern.  THEN:  Walk this path with FEWER STEPS is you your goal.  Exercises - Side Stepping with Counter Support  - 1-2 x daily - 7 x weekly - 1 sets - 2-3 min hold - Backward Walking with Counter Support  - 1-2 x daily - 7 x weekly - 1 sets - 2-3 min hold - Alternating Step Taps with Counter Support  - 1 x daily - 7 x weekly - 2 sets - 10 reps - Corner Balance Feet Apart: Eyes Closed With Head Turns  - 1 x daily - 7 x weekly - 3 sets - 5 reps - Stride Stance Weight Shift  - 1 x daily - 7 x weekly - 2 sets - 10 reps - Alternating Heel Raises  - 1 x daily - 7 x weekly - 2 sets - 10 reps - Seated Gaze Stabilization with Head Rotation  - 1 x daily - 5 x weekly - 2-3 sets - 20-30 sec  hold - Feet Together Balance at [employer] Eyes Closed  - 1 x daily - 7 x weekly - 3 sets - 5 reps - Standing Gastroc Stretch at Counter  - 1-2 x daily - 7 x weekly - 1 sets - 3 reps - 30 sec hold  PATIENT EDUCATION: Education details: addition to HEP Person educated: Patient Education method: Programmer, multimedia, Demonstration, and Handouts Education comprehension: verbalized understanding, returned demonstration, and needs further education  -------------------------------------------------- TODAY'S TREATMENT: 01/17/24 Activity Comments  5xSTS  13.08 sec with posterior LOB into chair on last rep  TUG 16.19 sec without AD                 VESTIBULAR ASSESSMENT   GENERAL OBSERVATION: pt wears progressives    OCULOMOTOR EXAM: Ocular Alignment: Cover/Uncover Test: WNL Cover/Cross Cover Test: WNL   Ocular ROM: No Limitations   Spontaneous Nystagmus: absent   Gaze-Induced Nystagmus: absent   Smooth Pursuits: intact   Saccades: intact (c/o "tipping" and dizzy with horizontal direction"   Convergence/Divergence: 24 cm ; L convergence insufficiency    VESTIBULAR - OCULAR REFLEX:    Slow VOR: Normal; c/o dizziness horizontal    VOR Cancellation: Normal   Head-Impulse Test: possible +R; difficult to  accurately test d/t guarding       POSITIONAL TESTING:  Right Roll Test: negative Left Roll Test: negative   Right Sidelying: negative  Left Sidelying: negative   ------------------------------------------    PATIENT EDUCATION: Education details: 01/22/2024:  Review HEP update, instructions on optimal VOR performance, rationale for balance/vestibular targeted exercises, with decreased vestibular system use on MCTSIB test Person educated: Patient Education method: Explanation, Demonstration, Tactile cues, Verbal cues, and Handouts Education comprehension: verbalized understanding and returned demonstration    Ways to offset your blood pressure fluctuations (lowering):  1)  Talk to Dr. Arlene Ben  2)  Stay well hydrated through your day with plenty of water  3)  If you sit for long periods-do several exercises for your legs before you stand up  -ankle pumps  -seated march  -seated leg kicks  4)  When you transition between lying down to sitting up or sitting to standing, take a moment to "get your bearings" to get set before you begin to move  5)  ??Consider asking MD about abdominal binder or compression socks ??-Probably not needed, given BP measures 01/15/2024  ---------------------------------------------------- Note: Objective  measures were completed at Evaluation unless otherwise noted.  DIAGNOSTIC FINDINGS: NA for this episode  COGNITION: Overall cognitive status: Within functional limits for tasks assessed   SENSATION: Light touch: WFL  EDEMA: Pt reports some BLE swelling and being monitored by MD   POSTURE: rounded shoulders and forward head  VITALS:   142/90, HR 71 bpm Seated   144/79  HR 74 bpm  Standing    LOWER EXTREMITY ROM:   AROM WFL   LOWER EXTREMITY MMT:  Grossly tested at least 4/5 BLEs   TRANSFERS: Sit to stand: Modified independence  Assistive device utilized: None     Stand to sit: Modified independence  Assistive device utilized: None      *Reports she needs to use hands most times to push up to stand from chairs at home*  GAIT:   FUNCTIONAL TESTS:  5 times sit to stand: 15.25 sec Timed up and go (TUG): 19.35 sed with walking pole (compared to 14 sec last bout of therapy)* 10 meter walk test: 17.98 sec (1.82 ft/sec) *compared to 2.52 ft/sec last bout of therapy* Mini-BESTest: 13/24 (decreased from 24/28 from last bout of therapy) TUG cognitive: 20.07 sec (compared to 15 sec last bout of therapy)* 3 M backwards walk:  11.94 sec                                                                                                                              TREATMENT DATE: 12/20/2023    PATIENT EDUCATION: Education details: Initial eval results, POC; encouraged general activity and walking with 4WW; preparation for neurologist visit to discuss symptoms/dizziness/slowed mobility  Person educated: Patient Education method: Explanation Education comprehension: verbalized understanding   GOALS: Goals reviewed with patient? Yes  SHORT TERM GOALS: Target date: 01/18/2024  Pt will be independent with HEP for improved balance, strength, gait. Baseline: Goal status: MET, 01/15/2024  2.  Pt will improve 5x sit<>stand to less than or equal to 12.5 sec to demonstrate improved functional strength and transfer efficiency. Baseline: 15.25 sec; 13.08 sec 01/17/24 Goal status: IN PROGRESS 01/17/24  3.  Pt will improve TUG score to less than or equal to 15 sec for decreased fall risk. Baseline: 19 sec; 16.19 sec 01/17/24 Goal status: IN PROGRESS 01/17/24  LONG TERM GOALS: Target date: 02/15/2024  Pt will be independent with HEP for improved balance, strength, gait. Baseline:  Goal status: IN PROGRESS  2.  Pt will improve MiniBESTest score to at least 20/28 to decrease fall risk. Baseline: 13/28 Goal status: IN PROGRESS  3.  Pt will improve gait velocity to at least 2.3 ft/sec for improved gait efficiency and safety.  Baseline: 1.82  ft/sec Goal status: IN PROGRESS  4.  Pt will verbalize plans for continued community fitness to maximize gains made in PT. Baseline:  Goal status: IN PROGRESS   ASSESSMENT:  CLINICAL IMPRESSION: Pt presents today reporting some dizziness after sitting in the lobby. Skilled PT session focused  on review of seated VOR, with pt increasing time to 9-10 sec.  Also worked on pencil push-up exercise today for convergence, and pt at best is able to bring target in to 27 cm prior to becoming double.  She does report she has eye doctor visit next week.  Worked on compliant surface, multi-sensory balance work, with pt needing UE support with EC balance exercises.  Also worked on hip/ankle strategy work, and pt feels tightness in [place], so added runner's stretch to HEP.  Pt reports no dizziness at end of session. Pt will continue to benefit from skilled PT towards goals for improved functional mobility and decreased fall risk.  OBJECTIVE IMPAIRMENTS: Abnormal gait, decreased balance, decreased mobility, difficulty walking, decreased strength, and dizziness.   ACTIVITY LIMITATIONS: standing, transfers, and locomotion level  PARTICIPATION LIMITATIONS: meal prep, cleaning, laundry, driving, shopping, and community activity  PERSONAL FACTORS: 3+ comorbidities: see above PMH; increasing dizziness/unsteadiness in past 1-2 months are also affecting patient's functional outcome.   REHAB POTENTIAL: Good  CLINICAL DECISION MAKING: Evolving/moderate complexity  EVALUATION COMPLEXITY: Moderate  PLAN:  PT FREQUENCY: 2x/week  PT DURATION: 8 weeks plus eval visit  PLANNED INTERVENTIONS: 97750- Physical Performance Testing, 97110-Therapeutic exercises, 97530- Therapeutic activity, 97112- Neuromuscular re-education, 97535- Self Care, [id number]- Manual therapy, [phone]- Gait training, Patient/Family education, Balance training, and Vestibular training  PLAN FOR NEXT SESSION:   Continue horizontal VOR, pencil  push-ups (ask about f/u with eye MD), Review and progress HEP (need to progress to compliant surface balance for vestibular system retraining), static and dynamic balance with head motions   Dessie Flow, PT 01/24/24 11:44 AM Phone: [phone] Fax: [phone]  [facility] at [facility] [address] [place], [place] [address] Phone # [phone] Fax # [phone]

## 2024-01-25 ENCOUNTER — Encounter: Payer: Self-pay | Admitting: Family Medicine

## 2024-01-26 ENCOUNTER — Telehealth: Payer: Self-pay | Admitting: Neurology

## 2024-01-26 DIAGNOSIS — G20B1 Parkinson's disease with dyskinesia, without mention of fluctuations: Secondary | ICD-10-CM

## 2024-01-29 ENCOUNTER — Other Ambulatory Visit: Payer: Self-pay

## 2024-01-29 NOTE — Telephone Encounter (Signed)
 Pt called in on 01/26/24 and left a message with the after hours service. She needs a refill on her pramipexole .

## 2024-01-30 ENCOUNTER — Encounter: Payer: Self-pay | Admitting: Physical Therapy

## 2024-01-30 ENCOUNTER — Ambulatory Visit: Admitting: Physical Therapy

## 2024-01-30 DIAGNOSIS — R2689 Other abnormalities of gait and mobility: Secondary | ICD-10-CM

## 2024-01-30 DIAGNOSIS — R42 Dizziness and giddiness: Secondary | ICD-10-CM

## 2024-01-30 DIAGNOSIS — R2681 Unsteadiness on feet: Secondary | ICD-10-CM | POA: Diagnosis not present

## 2024-01-30 DIAGNOSIS — R293 Abnormal posture: Secondary | ICD-10-CM | POA: Diagnosis not present

## 2024-01-30 DIAGNOSIS — H40033 Anatomical narrow angle, bilateral: Secondary | ICD-10-CM | POA: Diagnosis not present

## 2024-01-30 DIAGNOSIS — M6281 Muscle weakness (generalized): Secondary | ICD-10-CM | POA: Diagnosis not present

## 2024-01-30 DIAGNOSIS — H04123 Dry eye syndrome of bilateral lacrimal glands: Secondary | ICD-10-CM | POA: Diagnosis not present

## 2024-01-30 DIAGNOSIS — H2513 Age-related nuclear cataract, bilateral: Secondary | ICD-10-CM | POA: Diagnosis not present

## 2024-01-30 NOTE — Therapy (Signed)
 OUTPATIENT PHYSICAL THERAPY NEURO TREATMENT   Patient Name: Kristin [name] MRN: [id number] DOB:01/22/1951, 73 y.o., female Today's Date: 01/30/2024   PCP:    Almira Jaeger, MD   REFERRING PROVIDER: Shirline Dover, DO   END OF SESSION:  PT End of Session - 01/30/24 0757     Visit Number 9    Number of Visits 17    Date for PT Re-Evaluation 02/15/24    Authorization Type Medicare/[employer]    Progress Note Due on Visit 10    PT Start Time 0800    PT Stop Time 0840    PT Time Calculation (min) 40 min    Activity Tolerance Patient tolerated treatment well    Behavior During Therapy [facility] for tasks assessed/performed;Anxious                     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   Hyperlipidemia    Hypertension    Infertility, female    Low back pain 10/17/2007   Qualifier: Diagnosis of  By: Larrie Po MD, Wilmon Hashimoto    Measles as a child   Obesity    Osteoarthritis    Parkinson disease (HCC)    Plantar fasciitis of left foot 06/19/2012   Preventative health care 12/19/2015   Rheumatoid arthritis (HCC) 01/08/2018   Rosacea 10/05/2014   Swallowing difficulty    Urinary frequency 02/25/2013   Visual floaters 05/11/2014   Past Surgical History:  Procedure Laterality Date   ABDOMINAL HYSTERECTOMY  2006   total   esophageal     stretching   HERNIA REPAIR N/A 2022   hiatal hernia   laporoscopy     LEFT  HEART CATH AND CORONARY ANGIOGRAPHY N/A 06/02/2019   Procedure: LEFT HEART CATH AND CORONARY ANGIOGRAPHY;  Surgeon: Lucendia Rusk, MD;  Location: [facility];  Service: Cardiovascular;  Laterality: N/A;   TONSILLECTOMY     TOTAL HIP ARTHROPLASTY Right 2010   UPPER GASTROINTESTINAL ENDOSCOPY     wisdom teeth extracted     Patient Active Problem List   Diagnosis Date Noted   Vitamin B 12 deficiency 05/30/2021   Anemia 03/16/2020   Coronary artery disease involving native coronary artery of native heart without angina pectoris 06/12/2019   Diastolic dysfunction 05/16/2019   Rheumatoid arthritis (HCC) 01/08/2018   Hand pain, right 10/15/2017   Vitamin D  deficiency 12/20/2016   Prediabetes 12/20/2016   Shortness of breath on exertion 11/27/2016   Morbid obesity (HCC)    Constipation 12/07/2015   BCC (basal cell carcinoma of skin) 10/05/2014   Rosacea 10/05/2014   Parkinson's disease (HCC) 05/11/2014   Screening for cervical cancer 07/17/2012   Foot pain, bilateral 06/19/2012   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   Depression with anxiety 11/02/2009   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  Lumbar back pain with radiculopathy affecting lower extremity 10/17/2007   Essential hypertension 07/05/2007   Osteoarthritis 07/05/2007   Hyperlipidemia 06/26/2007   H/O: iron  deficiency anemia 05/08/2007   Hiatal hernia with GERD 05/08/2007    ONSET DATE: 11/29/2023 (MD referral)  REFERRING DIAG: G20.A1 (ICD-10-CM) - Parkinson's disease without dyskinesia or fluctuating manifestations (HCC)   THERAPY DIAG:  Unsteadiness on feet  Other abnormalities of gait and mobility  Dizziness and giddiness  Rationale for Evaluation and Treatment: Rehabilitation  SUBJECTIVE:                                                                                                                                                                                             SUBJECTIVE STATEMENT: Have  the ophthalmologist appointment today at 9.  Do get dizzy, but not like before. Pt accompanied by: self  PERTINENT HISTORY: Parkinson's disease, gastroparesis, anxiety, hx of chest pain, knee OA, RA  PAIN:  Are you having pain? No  PRECAUTIONS: Fall  RED FLAGS: None   WEIGHT BEARING RESTRICTIONS: No  FALLS: Has patient fallen in last 6 months? No  LIVING ENVIRONMENT: Lives with: lives with their spouse Lives in: House/apartment Stairs: stairs in home with rails Has following equipment at home: Otho Blitz - 4 wheeled and bilateral walking poles  PLOF: Independent, Independent with household mobility with device, and Independent with community mobility with device  PATIENT GOALS: To try to combat the slowness and work on the dizziness.  OBJECTIVE:    TODAY'S TREATMENT: 01/30/2024 Activity Comments  Seated VOR 2 reps-5.7 sec, 10.35 sec Arms length target   Pencil push-ups 5 reps 26>20 cm  Review of gastroc stretch Good return demo  On Airex: Feet apart/feet together EC head turns/nods Feet together EO 30 sec Light UE support  On Airex: -Sidestep off/on Airex 2 x 10 -Forward step off/on, 2 x 10 -Back step off/on, 2 x 10 UE support, cues for increased deliberate motion for foot clearance  Stagger stance rocking x 10, rock and reach x 10   Gait activities: Forward walking gait Gait with faster speed Forward/back gait Forward gait with 5# weight carry  Focus on arm swing, step length    *No complaints at end of session*  HOME EXERCISE PROGRAM: Access Code: [email or web address] URL: [email or web address] Date: 01/24/2024 Prepared by: [facility] - Outpatient  Rehab - [facility]  Program Notes Pick 1-2 paths in your house that you would typically walk.  Count steps in your typical walking pattern.  THEN:  Walk this path with FEWER STEPS is you your goal.  Exercises - Side Stepping with Counter Support  -  1-2 x daily - 7 x weekly - 1 sets - 2-3 min hold -  Backward Walking with Counter Support  - 1-2 x daily - 7 x weekly - 1 sets - 2-3 min hold - Alternating Step Taps with Counter Support  - 1 x daily - 7 x weekly - 2 sets - 10 reps - Corner Balance Feet Apart: Eyes Closed With Head Turns  - 1 x daily - 7 x weekly - 3 sets - 5 reps - Stride Stance Weight Shift  - 1 x daily - 7 x weekly - 2 sets - 10 reps - Alternating Heel Raises  - 1 x daily - 7 x weekly - 2 sets - 10 reps - Seated Gaze Stabilization with Head Rotation  - 1 x daily - 5 x weekly - 2-3 sets - 20-30 sec  hold - Feet Together Balance at [employer] Eyes Closed  - 1 x daily - 7 x weekly - 3 sets - 5 reps - Standing Gastroc Stretch at Counter  - 1-2 x daily - 7 x weekly - 1 sets - 3 reps - 30 sec hold  PATIENT EDUCATION: Education details: Continue current HEP; educated on deliberate, increased effort with gait activities-BIGGER effort = normal movement patterns; use of visualization as technique for successful movement patterns; encouraged pt to find compliant surface for home Person educated: Patient Education method: Explanation, Demonstration, and Handouts Education comprehension: verbalized understanding, returned demonstration, and needs further education  -------------------------------------------------- TODAY'S TREATMENT: 01/17/24 Activity Comments  5xSTS  13.08 sec with posterior LOB into chair on last rep  TUG 16.19 sec without AD                 VESTIBULAR ASSESSMENT   GENERAL OBSERVATION: pt wears progressives    OCULOMOTOR EXAM: Ocular Alignment: Cover/Uncover Test: WNL Cover/Cross Cover Test: WNL   Ocular ROM: No Limitations   Spontaneous Nystagmus: absent   Gaze-Induced Nystagmus: absent   Smooth Pursuits: intact   Saccades: intact (c/o "tipping" and dizzy with horizontal direction"   Convergence/Divergence: 24 cm ; L convergence insufficiency    VESTIBULAR - OCULAR REFLEX:    Slow VOR: Normal; c/o dizziness horizontal    VOR Cancellation:  Normal   Head-Impulse Test: possible +R; difficult to accurately test d/t guarding       POSITIONAL TESTING:  Right Roll Test: negative Left Roll Test: negative   Right Sidelying: negative  Left Sidelying: negative   ------------------------------------------    PATIENT EDUCATION: Education details: 01/22/2024:  Review HEP update, instructions on optimal VOR performance, rationale for balance/vestibular targeted exercises, with decreased vestibular system use on MCTSIB test Person educated: Patient Education method: Explanation, Demonstration, Tactile cues, Verbal cues, and Handouts Education comprehension: verbalized understanding and returned demonstration    Ways to offset your blood pressure fluctuations (lowering):  1)  Talk to Dr. Arlene Ben  2)  Stay well hydrated through your day with plenty of water  3)  If you sit for long periods-do several exercises for your legs before you stand up  -ankle pumps  -seated march  -seated leg kicks  4)  When you transition between lying down to sitting up or sitting to standing, take a moment to "get your bearings" to get set before you begin to move  5)  ??Consider asking MD about abdominal binder or compression socks ??-Probably not needed, given BP measures 01/15/2024  ---------------------------------------------------- Note: Objective measures were completed at Evaluation unless otherwise noted.  DIAGNOSTIC FINDINGS: NA for this  episode  COGNITION: Overall cognitive status: Within functional limits for tasks assessed   SENSATION: Light touch: WFL  EDEMA: Pt reports some BLE swelling and being monitored by MD   POSTURE: rounded shoulders and forward head  VITALS:   142/90, HR 71 bpm Seated   144/79  HR 74 bpm  Standing    LOWER EXTREMITY ROM:   AROM WFL   LOWER EXTREMITY MMT:  Grossly tested at least 4/5 BLEs   TRANSFERS: Sit to stand: Modified independence  Assistive device utilized: None     Stand to sit:  Modified independence  Assistive device utilized: None     *Reports she needs to use hands most times to push up to stand from chairs at home*  GAIT:   FUNCTIONAL TESTS:  5 times sit to stand: 15.25 sec Timed up and go (TUG): 19.35 sed with walking pole (compared to 14 sec last bout of therapy)* 10 meter walk test: 17.98 sec (1.82 ft/sec) *compared to 2.52 ft/sec last bout of therapy* Mini-BESTest: 13/24 (decreased from 24/28 from last bout of therapy) TUG cognitive: 20.07 sec (compared to 15 sec last bout of therapy)* 3 M backwards walk:  11.94 sec                                                                                                                              TREATMENT DATE: 12/20/2023    PATIENT EDUCATION: Education details: Initial eval results, POC; encouraged general activity and walking with 4WW; preparation for neurologist visit to discuss symptoms/dizziness/slowed mobility  Person educated: Patient Education method: Explanation Education comprehension: verbalized understanding   GOALS: Goals reviewed with patient? Yes  SHORT TERM GOALS: Target date: 01/18/2024  Pt will be independent with HEP for improved balance, strength, gait. Baseline: Goal status: MET, 01/15/2024  2.  Pt will improve 5x sit<>stand to less than or equal to 12.5 sec to demonstrate improved functional strength and transfer efficiency. Baseline: 15.25 sec; 13.08 sec 01/17/24 Goal status: IN PROGRESS 01/17/24  3.  Pt will improve TUG score to less than or equal to 15 sec for decreased fall risk. Baseline: 19 sec; 16.19 sec 01/17/24 Goal status: IN PROGRESS 01/17/24  LONG TERM GOALS: Target date: 02/15/2024  Pt will be independent with HEP for improved balance, strength, gait. Baseline:  Goal status: IN PROGRESS  2.  Pt will improve MiniBESTest score to at least 20/28 to decrease fall risk. Baseline: 13/28 Goal status: IN PROGRESS  3.  Pt will improve gait velocity to at least 2.3 ft/sec  for improved gait efficiency and safety.  Baseline: 1.82 ft/sec Goal status: IN PROGRESS  4.  Pt will verbalize plans for continued community fitness to maximize gains made in PT. Baseline:  Goal status: IN PROGRESS   ASSESSMENT:  CLINICAL IMPRESSION: Pt presents today with no new complaints. Skilled PT session focused on mutli-sensory balance and gait activities.  Worked on compliant surface for static and dynamic stepping activities, with  pt needing cues for increased effort for improved foot clearance and step length off and off Airex surface.  Gait training with cues for increased arm swing, step length, foot clearance-in forward/back direction as well as with weighted carry.  Pt reports improved balance with arm swing, and particularly feels better with weighted carry.  No complaints at end of session.   Pt will continue to benefit from skilled PT towards goals for improved functional mobility and decreased fall risk.   OBJECTIVE IMPAIRMENTS: Abnormal gait, decreased balance, decreased mobility, difficulty walking, decreased strength, and dizziness.   ACTIVITY LIMITATIONS: standing, transfers, and locomotion level  PARTICIPATION LIMITATIONS: meal prep, cleaning, laundry, driving, shopping, and community activity  PERSONAL FACTORS: 3+ comorbidities: see above PMH; increasing dizziness/unsteadiness in past 1-2 months are also affecting patient's functional outcome.   REHAB POTENTIAL: Good  CLINICAL DECISION MAKING: Evolving/moderate complexity  EVALUATION COMPLEXITY: Moderate  PLAN:  PT FREQUENCY: 2x/week  PT DURATION: 8 weeks plus eval visit  PLANNED INTERVENTIONS: 97750- Physical Performance Testing, 97110-Therapeutic exercises, 97530- Therapeutic activity, 97112- Neuromuscular re-education, 97535- Self Care, [id number]- Manual therapy, [phone]- Gait training, Patient/Family education, Balance training, and Vestibular training  PLAN FOR NEXT SESSION:   Continue horizontal VOR,  pencil push-ups (ask about f/u with eye MD), Review and progress HEP (*need to progress to compliant surface balance for vestibular system retraining), static and dynamic balance with head motions   Dessie Flow, PT 01/30/24 8:47 AM Phone: [phone] Fax: [phone]  [facility] at [facility] [address], [address] [place], [place] [address] Phone # [phone] Fax # [phone]

## 2024-01-31 ENCOUNTER — Other Ambulatory Visit: Payer: Self-pay | Admitting: Psychiatry

## 2024-01-31 ENCOUNTER — Ambulatory Visit: Admitting: Physician Assistant

## 2024-01-31 ENCOUNTER — Other Ambulatory Visit: Payer: Self-pay | Admitting: Family Medicine

## 2024-01-31 NOTE — Telephone Encounter (Signed)
 Made several attempts to reach patient no answer

## 2024-01-31 NOTE — Telephone Encounter (Signed)
 I believe she still has medication remaining. Please let me know if she needs a refill before her next visit.

## 2024-02-04 ENCOUNTER — Ambulatory Visit: Admitting: Physical Therapy

## 2024-02-05 ENCOUNTER — Ambulatory Visit: Attending: Neurology | Admitting: Physical Therapy

## 2024-02-05 ENCOUNTER — Ambulatory Visit

## 2024-02-05 ENCOUNTER — Ambulatory Visit
Admission: RE | Admit: 2024-02-05 | Discharge: 2024-02-05 | Disposition: A | Discharge status: Home / Self Care | Source: Ambulatory Visit | Attending: Obstetrics and Gynecology | Admitting: Obstetrics and Gynecology

## 2024-02-05 ENCOUNTER — Encounter: Payer: Self-pay | Admitting: Physical Therapy

## 2024-02-05 DIAGNOSIS — Z6841 Body Mass Index (BMI) 40.0 and over, adult: Secondary | ICD-10-CM | POA: Diagnosis not present

## 2024-02-05 DIAGNOSIS — R29818 Other symptoms and signs involving the nervous system: Secondary | ICD-10-CM | POA: Insufficient documentation

## 2024-02-05 DIAGNOSIS — M6281 Muscle weakness (generalized): Secondary | ICD-10-CM | POA: Diagnosis not present

## 2024-02-05 DIAGNOSIS — R42 Dizziness and giddiness: Secondary | ICD-10-CM | POA: Diagnosis present

## 2024-02-05 DIAGNOSIS — R2681 Unsteadiness on feet: Secondary | ICD-10-CM | POA: Insufficient documentation

## 2024-02-05 DIAGNOSIS — R2689 Other abnormalities of gait and mobility: Secondary | ICD-10-CM | POA: Diagnosis present

## 2024-02-05 DIAGNOSIS — E65 Localized adiposity: Secondary | ICD-10-CM | POA: Diagnosis not present

## 2024-02-05 DIAGNOSIS — I10 Essential (primary) hypertension: Secondary | ICD-10-CM | POA: Diagnosis not present

## 2024-02-05 DIAGNOSIS — Z1231 Encounter for screening mammogram for malignant neoplasm of breast: Secondary | ICD-10-CM

## 2024-02-05 DIAGNOSIS — E66813 Obesity, class 3: Secondary | ICD-10-CM | POA: Diagnosis not present

## 2024-02-05 DIAGNOSIS — K3184 Gastroparesis: Secondary | ICD-10-CM | POA: Diagnosis not present

## 2024-02-05 DIAGNOSIS — R7303 Prediabetes: Secondary | ICD-10-CM | POA: Diagnosis not present

## 2024-02-05 NOTE — Therapy (Signed)
 OUTPATIENT PHYSICAL THERAPY NEURO TREATMENT/10th VISIT PROGRESS NOTE   Patient Name: Kristin [name] MRN: [id number] DOB:[birth year], 73 y.o., female Today's Date: 02/05/2024   PCP:    Almira Jaeger, MD   REFERRING PROVIDER: Shirline Dover, DO   Progress Note Reporting Period 12/20/2023 to 02/05/2024  See note below for Objective Data and Assessment of Progress/Goals.     END OF SESSION:  PT End of Session - 02/05/24 1102     Visit Number 10    Number of Visits 17    Date for PT Re-Evaluation 02/15/24    Authorization Type Medicare/[employer]    Progress Note Due on Visit 10    PT Start Time 1104    PT Stop Time 1142    PT Time Calculation (min) 38 min    Activity Tolerance Patient tolerated treatment well    Behavior During Therapy WFL for tasks assessed/performed                      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   Hyperlipidemia    Hypertension    Infertility, female    Low back pain 10/17/2007   Qualifier: Diagnosis of  By: Larrie Po MD, Wilmon Hashimoto    Measles as a child   Obesity    Osteoarthritis    Parkinson disease (HCC)    Plantar fasciitis of left foot 06/19/2012   Preventative health care 12/19/2015   Rheumatoid arthritis (HCC) 01/08/2018   Rosacea 10/05/2014   Swallowing difficulty    Urinary frequency 02/25/2013   Visual floaters 05/11/2014   Past Surgical History:  Procedure  Laterality Date   ABDOMINAL HYSTERECTOMY  2006   total   esophageal     stretching   HERNIA REPAIR N/A 2022   hiatal hernia   laporoscopy     LEFT HEART CATH AND CORONARY ANGIOGRAPHY N/A 06/02/2019   Procedure: LEFT HEART CATH AND CORONARY ANGIOGRAPHY;  Surgeon: Lucendia Rusk, MD;  Location: [facility];  Service: Cardiovascular;  Laterality: N/A;   TONSILLECTOMY     TOTAL HIP ARTHROPLASTY Right 2010   UPPER GASTROINTESTINAL ENDOSCOPY     wisdom teeth extracted     Patient Active Problem List   Diagnosis Date Noted   Vitamin B 12 deficiency 05/30/2021   Anemia 03/16/2020   Coronary artery disease involving native coronary artery of native heart without angina pectoris 06/12/2019   Diastolic dysfunction 05/16/2019   Rheumatoid arthritis (HCC) 01/08/2018   Hand pain, right 10/15/2017   Vitamin D  deficiency 12/20/2016   Prediabetes 12/20/2016   Shortness of breath on exertion 11/27/2016   Morbid obesity (HCC)    Constipation 12/07/2015   BCC (basal cell carcinoma of skin) 10/05/2014   Rosacea 10/05/2014   Parkinson's disease (HCC) 05/11/2014   Screening for  cervical cancer 07/17/2012   Foot pain, bilateral 06/19/2012   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   Depression with anxiety 11/02/2009   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   Lumbar back pain with radiculopathy affecting lower extremity 10/17/2007   Essential hypertension 07/05/2007   Osteoarthritis 07/05/2007   Hyperlipidemia 06/26/2007   H/O: iron  deficiency anemia 05/08/2007   Hiatal hernia with GERD 05/08/2007    ONSET DATE: 11/29/2023 (MD referral)  REFERRING DIAG: G20.A1 (ICD-10-CM) - Parkinson's disease without dyskinesia or fluctuating manifestations (HCC)   THERAPY DIAG:  Unsteadiness on feet  Other abnormalities of gait and mobility  Dizziness and giddiness  Rationale for Evaluation and Treatment: Rehabilitation  SUBJECTIVE:                                                                                                                                                                                              SUBJECTIVE STATEMENT: Things have gone well and they've just been busy.  The eye doctor visit went okay.  He was quick and said my eyes were tracking fine.  I'm not dizzy today or the last few days.  Really have worked on visualizing things in regards to my walking and that helps. Pt accompanied by: self  PERTINENT HISTORY: Parkinson's disease, gastroparesis, anxiety, hx of chest pain, knee OA, RA  PAIN:  Are you having pain? No  PRECAUTIONS: Fall  RED FLAGS: None   WEIGHT BEARING RESTRICTIONS: No  FALLS: Has patient fallen in last 6 months? No  LIVING ENVIRONMENT: Lives with: lives with their spouse Lives in: House/apartment Stairs: stairs in home with rails Has following equipment at home: Otho Blitz - 4 wheeled and bilateral walking poles  PLOF: Independent, Independent with household mobility with device, and Independent with community mobility with device  PATIENT GOALS: To try to combat the slowness and work on the dizziness.  OBJECTIVE:    TODAY'S TREATMENT: 02/05/2024 Activity Comments  TUG:  13.34 Improved from 19 sec at eval  10 M walk:  13 sec no device (2.52 ft/sec) Improved from 1.8 ft/sec  FTSTS:  13.56 sec   Seated VOR:  9.92 sec; 11 sec   Convergence:  17 cm   On Airex: -Sidestep off/on Airex 2 x 10 -Forward step off/on, 2 x 10 -Back step off/on, 2 x 10 -Forward/back step and weightshift, x 10 Added UE motions-has to keep 1 UE support at parallel bars  On Airex: Feet together EC head turns/nods Feet together EO 30 sec Feet partial heel/toe EO, EC 30 sec   Gait with arm swing, 50 ft x 4 reps, including turns No d/o dizziness     *  No complaints at end of session*  HOME EXERCISE PROGRAM: Access Code: [email or web address] URL: [email or web address] Date: 01/24/2024 Prepared by: [facility]  Program Notes Pick 1-2 paths in your house that you would typically walk.  Count steps in your typical walking pattern.  THEN:  Walk this path with FEWER STEPS is you your goal.  Exercises - Side Stepping with Counter Support  - 1-2 x daily - 7 x weekly - 1 sets - 2-3 min hold - Backward Walking with Counter Support  - 1-2 x daily - 7 x weekly - 1 sets - 2-3 min hold - Alternating Step Taps with Counter Support  - 1 x daily - 7 x weekly - 2 sets - 10 reps - Corner Balance Feet Apart: Eyes Closed With Head Turns  - 1 x daily - 7 x weekly - 3 sets - 5 reps - Stride Stance Weight Shift  - 1 x daily - 7 x weekly - 2 sets - 10 reps - Alternating Heel Raises  - 1 x daily - 7 x weekly - 2 sets - 10 reps - Seated Gaze Stabilization with Head Rotation  - 1 x daily - 5 x weekly - 2-3 sets - 20-30 sec  hold - Feet Together Balance at [employer] Eyes Closed  - 1 x daily - 7 x weekly - 3 sets - 5 reps - Standing Gastroc Stretch at Counter  - 1-2 x daily - 7 x weekly - 1 sets - 3 reps - 30 sec hold  PATIENT EDUCATION: Education details: Progress towards goals Person educated: Patient Education method: Explanation, Demonstration, and Handouts Education comprehension: verbalized understanding, returned demonstration, and needs further education  -------------------------------------------------- TODAY'S TREATMENT: 01/17/24 Activity Comments  5xSTS  13.08 sec with posterior LOB into chair on last rep  TUG 16.19 sec without AD                 VESTIBULAR ASSESSMENT   GENERAL OBSERVATION: pt wears progressives    OCULOMOTOR EXAM: Ocular Alignment: Cover/Uncover Test: WNL Cover/Cross Cover Test: WNL   Ocular ROM: No Limitations   Spontaneous Nystagmus: absent   Gaze-Induced Nystagmus: absent   Smooth Pursuits: intact   Saccades: intact (c/o "tipping" and dizzy with horizontal direction"   Convergence/Divergence: 24 cm ; L convergence insufficiency    VESTIBULAR - OCULAR REFLEX:     Slow VOR: Normal; c/o dizziness horizontal    VOR Cancellation: Normal   Head-Impulse Test: possible +R; difficult to accurately test d/t guarding       POSITIONAL TESTING:  Right Roll Test: negative Left Roll Test: negative   Right Sidelying: negative  Left Sidelying: negative   ------------------------------------------    PATIENT EDUCATION: Education details: 01/22/2024:  Review HEP update, instructions on optimal VOR performance, rationale for balance/vestibular targeted exercises, with decreased vestibular system use on MCTSIB test Person educated: Patient Education method: Explanation, Demonstration, Tactile cues, Verbal cues, and Handouts Education comprehension: verbalized understanding and returned demonstration    Ways to offset your blood pressure fluctuations (lowering):  1)  Talk to Dr. Arlene Ben  2)  Stay well hydrated through your day with plenty of water  3)  If you sit for long periods-do several exercises for your legs before you stand up  -ankle pumps  -seated march  -seated leg kicks  4)  When you transition between lying down to sitting up or sitting to standing, take a moment to "get your bearings" to get  set before you begin to move  5)  ??Consider asking MD about abdominal binder or compression socks ??-Probably not needed, given BP measures 01/15/2024  ---------------------------------------------------- Note: Objective measures were completed at Evaluation unless otherwise noted.  DIAGNOSTIC FINDINGS: NA for this episode  COGNITION: Overall cognitive status: Within functional limits for tasks assessed   SENSATION: Light touch: WFL  EDEMA: Pt reports some BLE swelling and being monitored by MD   POSTURE: rounded shoulders and forward head  VITALS:   142/90, HR 71 bpm Seated   144/79  HR 74 bpm  Standing    LOWER EXTREMITY ROM:   AROM WFL   LOWER EXTREMITY MMT:  Grossly tested at least 4/5 BLEs   TRANSFERS: Sit to stand:  Modified independence  Assistive device utilized: None     Stand to sit: Modified independence  Assistive device utilized: None     *Reports she needs to use hands most times to push up to stand from chairs at home*  GAIT:   FUNCTIONAL TESTS:  5 times sit to stand: 15.25 sec Timed up and go (TUG): 19.35 sed with walking pole (compared to 14 sec last bout of therapy)* 10 meter walk test: 17.98 sec (1.82 ft/sec) *compared to 2.52 ft/sec last bout of therapy* Mini-BESTest: 13/24 (decreased from 24/28 from last bout of therapy) TUG cognitive: 20.07 sec (compared to 15 sec last bout of therapy)* 3 M backwards walk:  11.94 sec                                                                                                                              TREATMENT DATE: 12/20/2023    PATIENT EDUCATION: Education details: Initial eval results, POC; encouraged general activity and walking with 4WW; preparation for neurologist visit to discuss symptoms/dizziness/slowed mobility  Person educated: Patient Education method: Explanation Education comprehension: verbalized understanding   GOALS: Goals reviewed with patient? Yes  SHORT TERM GOALS: Target date: 01/18/2024  Pt will be independent with HEP for improved balance, strength, gait. Baseline: Goal status: MET, 01/15/2024  2.  Pt will improve 5x sit<>stand to less than or equal to 12.5 sec to demonstrate improved functional strength and transfer efficiency. Baseline: 15.25 sec; 13.08 sec 01/17/24 Goal status: IN PROGRESS 01/17/24  3.  Pt will improve TUG score to less than or equal to 15 sec for decreased fall risk. Baseline: 19 sec; 16.19 sec 01/17/24>13.34 sec Goal status: MET, 02/05/2024  LONG TERM GOALS: Target date: 02/15/2024  Pt will be independent with HEP for improved balance, strength, gait. Baseline:  Goal status: IN PROGRESS  2.  Pt will improve MiniBESTest score to at least 20/28 to decrease fall risk. Baseline: 13/28 Goal  status: IN PROGRESS  3.  Pt will improve gait velocity to at least 2.3 ft/sec for improved gait efficiency and safety.  Baseline: 1.82 ft/sec>2.52 ft/sec Goal status: MET, 02/05/2024  4.  Pt will verbalize plans for continued community fitness to maximize gains  made in PT. Baseline:  Goal status: IN PROGRESS   ASSESSMENT:  CLINICAL IMPRESSION: 10th VISIT PROGRESS NOTE:  Pt presents today and reports no dizziness in the past several days; no complaints of dizziness during PT session today. Skilled PT session focused on objective measure check:  TUG improved to 13 sec from 19 sec (STG 3 met); Gait velocity improved from 1.8 ft/sec to 2.52 ft/sec (LTG 3 met).  VOR time is increasing to 10 seconds and pt's convergence is improving to 17 cm.  Pt is progressing well towards goals; she continues to have increased difficulty with balance on compliant surfaces, needing UE support for optimal balance.  She will continue to benefit from skilled PT towards goals for improved functional mobility and decreased fall risk.   OBJECTIVE IMPAIRMENTS: Abnormal gait, decreased balance, decreased mobility, difficulty walking, decreased strength, and dizziness.   ACTIVITY LIMITATIONS: standing, transfers, and locomotion level  PARTICIPATION LIMITATIONS: meal prep, cleaning, laundry, driving, shopping, and community activity  PERSONAL FACTORS: 3+ comorbidities: see above PMH; increasing dizziness/unsteadiness in past 1-2 months are also affecting patient's functional outcome.   REHAB POTENTIAL: Good  CLINICAL DECISION MAKING: Evolving/moderate complexity  EVALUATION COMPLEXITY: Moderate  PLAN:  PT FREQUENCY: 2x/week  PT DURATION: 8 weeks plus eval visit  PLANNED INTERVENTIONS: 97750- Physical Performance Testing, 97110-Therapeutic exercises, 97530- Therapeutic activity, [id number]- Neuromuscular re-education, 97535- Self Care, [id number]- Manual therapy, [phone]- Gait training, Patient/Family education, Balance  training, and Vestibular training  PLAN FOR NEXT SESSION:   Continue horizontal VOR, pencil push-ups; Review and progress HEP (*need to progress to compliant surface balance for vestibular system retraining), static and dynamic balance with head motions towards goals   Dessie Flow, PT 02/05/24 3:17 PM Phone: [phone] Fax: [phone]  [facility] [address] [place], [place] [address] Phone # [phone] Fax # [phone]

## 2024-02-07 ENCOUNTER — Ambulatory Visit: Admitting: Physical Therapy

## 2024-02-07 ENCOUNTER — Encounter: Payer: Self-pay | Admitting: Physical Therapy

## 2024-02-07 DIAGNOSIS — R42 Dizziness and giddiness: Secondary | ICD-10-CM

## 2024-02-07 DIAGNOSIS — R2689 Other abnormalities of gait and mobility: Secondary | ICD-10-CM

## 2024-02-07 DIAGNOSIS — M6281 Muscle weakness (generalized): Secondary | ICD-10-CM | POA: Diagnosis not present

## 2024-02-07 DIAGNOSIS — R29818 Other symptoms and signs involving the nervous system: Secondary | ICD-10-CM | POA: Diagnosis not present

## 2024-02-07 DIAGNOSIS — R2681 Unsteadiness on feet: Secondary | ICD-10-CM

## 2024-02-07 DIAGNOSIS — N3941 Urge incontinence: Secondary | ICD-10-CM | POA: Diagnosis not present

## 2024-02-07 NOTE — Therapy (Signed)
 OUTPATIENT PHYSICAL THERAPY NEURO TREATMENT NOTE   Patient Name: Kristin [name] MRN: [id number] DOB:1951/01/28, 73 y.o., female Today's Date: 02/07/2024   PCP:    Almira Jaeger, MD   REFERRING PROVIDER: Shirline Dover, DO      END OF SESSION:  PT End of Session - 02/07/24 0808     Visit Number 11    Number of Visits 17    Date for PT Re-Evaluation 02/15/24    Authorization Type Medicare/[employer]    PT Start Time 0806    PT Stop Time 0844    PT Time Calculation (min) 38 min    Activity Tolerance Patient tolerated treatment well    Behavior During Therapy [facility] for tasks assessed/performed                       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   Hyperlipidemia    Hypertension    Infertility, female    Low back pain 10/17/2007   Qualifier: Diagnosis of  By: Larrie Po MD, Wilmon Hashimoto    Measles as a child   Obesity    Osteoarthritis    Parkinson disease (HCC)    Plantar fasciitis of left foot 06/19/2012   Preventative health care 12/19/2015   Rheumatoid arthritis (HCC) 01/08/2018   Rosacea 10/05/2014   Swallowing difficulty    Urinary frequency 02/25/2013   Visual floaters 05/11/2014   Past Surgical History:  Procedure Laterality Date   ABDOMINAL HYSTERECTOMY  2006   total   esophageal     stretching   HERNIA REPAIR N/A 2022   hiatal hernia   laporoscopy     LEFT HEART CATH AND CORONARY  ANGIOGRAPHY N/A 06/02/2019   Procedure: LEFT HEART CATH AND CORONARY ANGIOGRAPHY;  Surgeon: Lucendia Rusk, MD;  Location: [facility] INVASIVE CV [facility];  Service: Cardiovascular;  Laterality: N/A;   TONSILLECTOMY     TOTAL HIP ARTHROPLASTY Right 2010   UPPER GASTROINTESTINAL ENDOSCOPY     wisdom teeth extracted     Patient Active Problem List   Diagnosis Date Noted   Vitamin B 12 deficiency 05/30/2021   Anemia 03/16/2020   Coronary artery disease involving native coronary artery of native heart without angina pectoris 06/12/2019   Diastolic dysfunction 05/16/2019   Rheumatoid arthritis (HCC) 01/08/2018   Hand pain, right 10/15/2017   Vitamin D  deficiency 12/20/2016   Prediabetes 12/20/2016   Shortness of breath on exertion 11/27/2016   Morbid obesity (HCC)    Constipation 12/07/2015   BCC (basal cell carcinoma of skin) 10/05/2014   Rosacea 10/05/2014   Parkinson's disease (HCC) 05/11/2014   Screening for cervical cancer 07/17/2012   Foot pain, bilateral 06/19/2012   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   Depression with anxiety 11/02/2009   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   Lumbar back  pain with radiculopathy affecting lower extremity 10/17/2007   Essential hypertension 07/05/2007   Osteoarthritis 07/05/2007   Hyperlipidemia 06/26/2007   H/O: iron  deficiency anemia 05/08/2007   Hiatal hernia with GERD 05/08/2007    ONSET DATE: 11/29/2023 (MD referral)  REFERRING DIAG: G20.A1 (ICD-10-CM) - Parkinson's disease without dyskinesia or fluctuating manifestations (HCC)   THERAPY DIAG:  Unsteadiness on feet  Other abnormalities of gait and mobility  Dizziness and giddiness  Rationale for Evaluation and Treatment: Rehabilitation  SUBJECTIVE:                                                                                                                                                                                             SUBJECTIVE STATEMENT: Woke up a little late today,  but so when I'm in a rush, I always have a little more trouble.  No dizziness though.  Did not sleep well last night.  Just had the PD medicine at 7:40. Pt accompanied by: self  PERTINENT HISTORY: Parkinson's disease, gastroparesis, anxiety, hx of chest pain, knee OA, RA  PAIN:  Are you having pain? No  PRECAUTIONS: Fall  RED FLAGS: None   WEIGHT BEARING RESTRICTIONS: No  FALLS: Has patient fallen in last 6 months? No  LIVING ENVIRONMENT: Lives with: lives with their spouse Lives in: House/apartment Stairs: stairs in home with rails Has following equipment at home: Otho Blitz - 4 wheeled and bilateral walking poles  PLOF: Independent, Independent with household mobility with device, and Independent with community mobility with device  PATIENT GOALS: To try to combat the slowness and work on the dizziness.  OBJECTIVE:    TODAY'S TREATMENT: 02/07/2024 Activity Comments  Gait x 3 min with no device, then 1 walking pole Slowed pace compared to last visit, pt reports feeling shaky  Seated PWR! Moves x 5 reps PREPARE for slowed, stretch  Sit to stand x 5 with PWR! UP   Gait x 2 min with no device Better pace, but still reports legs feel weak  Stagger stance forward/back rocking, 2-3 sets of 10 With added/coordinate arm swing  On Airex: -feet together EC head turns/nods -Sidestep off/on Airex 20 -Forward step off/on, 10 -Back step off/on,  10 -Forward/back step and weightshift, x 10 UE support-good awareness of step length and foot clearance       HOME EXERCISE PROGRAM: Access Code: [email or web address] URL: [email or web address] Date: 01/24/2024 Prepared by: [facility] - Outpatient  Rehab - [facility]  Program Notes Pick 1-2 paths in your house that you would typically walk.  Count steps in your typical walking pattern.  THEN:  Walk this path with FEWER STEPS  is you your goal.  Exercises - Side Stepping with Counter Support  - 1-2 x daily - 7 x weekly - 1 sets -  2-3 min hold - Backward Walking with Counter Support  - 1-2 x daily - 7 x weekly - 1 sets - 2-3 min hold - Alternating Step Taps with Counter Support  - 1 x daily - 7 x weekly - 2 sets - 10 reps - Corner Balance Feet Apart: Eyes Closed With Head Turns  - 1 x daily - 7 x weekly - 3 sets - 5 reps - Stride Stance Weight Shift  - 1 x daily - 7 x weekly - 2 sets - 10 reps - Alternating Heel Raises  - 1 x daily - 7 x weekly - 2 sets - 10 reps - Seated Gaze Stabilization with Head Rotation  - 1 x daily - 5 x weekly - 2-3 sets - 20-30 sec  hold - Feet Together Balance at [employer] Eyes Closed  - 1 x daily - 7 x weekly - 3 sets - 5 reps - Standing Gastroc Stretch at Counter  - 1-2 x daily - 7 x weekly - 1 sets - 3 reps - 30 sec hold  PATIENT EDUCATION: Education details: Answered questions about stagger stance rocking exercise and discussed walking program, walking track at [employer], use of Aerobic machines at [employer] Person educated: Patient Education method: Explanation, Demonstration, and Handouts Education comprehension: verbalized understanding, returned demonstration, and needs further education  -------------------------------------------------- TODAY'S TREATMENT: 01/17/24 Activity Comments  5xSTS  13.08 sec with posterior LOB into chair on last rep  TUG 16.19 sec without AD                 VESTIBULAR ASSESSMENT   GENERAL OBSERVATION: pt wears progressives    OCULOMOTOR EXAM: Ocular Alignment: Cover/Uncover Test: WNL Cover/Cross Cover Test: WNL   Ocular ROM: No Limitations   Spontaneous Nystagmus: absent   Gaze-Induced Nystagmus: absent   Smooth Pursuits: intact   Saccades: intact (c/o "tipping" and dizzy with horizontal direction"   Convergence/Divergence: 24 cm ; L convergence insufficiency    VESTIBULAR - OCULAR REFLEX:    Slow VOR: Normal; c/o dizziness horizontal    VOR Cancellation: Normal   Head-Impulse Test: possible +R; difficult to accurately test d/t  guarding       POSITIONAL TESTING:  Right Roll Test: negative Left Roll Test: negative   Right Sidelying: negative  Left Sidelying: negative   ------------------------------------------    PATIENT EDUCATION: Education details: 01/22/2024:  Review HEP update, instructions on optimal VOR performance, rationale for balance/vestibular targeted exercises, with decreased vestibular system use on MCTSIB test Person educated: Patient Education method: Explanation, Demonstration, Tactile cues, Verbal cues, and Handouts Education comprehension: verbalized understanding and returned demonstration    Ways to offset your blood pressure fluctuations (lowering):  1)  Talk to Dr. Arlene Ben  2)  Stay well hydrated through your day with plenty of water  3)  If you sit for long periods-do several exercises for your legs before you stand up  -ankle pumps  -seated march  -seated leg kicks  4)  When you transition between lying down to sitting up or sitting to standing, take a moment to "get your bearings" to get set before you begin to move  5)  ??Consider asking MD about abdominal binder or compression socks ??-Probably not needed, given BP measures 01/15/2024  ---------------------------------------------------- Note: Objective measures were completed at Evaluation unless otherwise noted.  DIAGNOSTIC FINDINGS: NA  for this episode  COGNITION: Overall cognitive status: Within functional limits for tasks assessed   SENSATION: Light touch: WFL  EDEMA: Pt reports some BLE swelling and being monitored by MD   POSTURE: rounded shoulders and forward head  VITALS:   142/90, HR 71 bpm Seated   144/79  HR 74 bpm  Standing    LOWER EXTREMITY ROM:   AROM WFL   LOWER EXTREMITY MMT:  Grossly tested at least 4/5 BLEs   TRANSFERS: Sit to stand: Modified independence  Assistive device utilized: None     Stand to sit: Modified independence  Assistive device utilized: None     *Reports she  needs to use hands most times to push up to stand from chairs at home*  GAIT:   FUNCTIONAL TESTS:  5 times sit to stand: 15.25 sec Timed up and go (TUG): 19.35 sed with walking pole (compared to 14 sec last bout of therapy)* 10 meter walk test: 17.98 sec (1.82 ft/sec) *compared to 2.52 ft/sec last bout of therapy* Mini-BESTest: 13/24 (decreased from 24/28 from last bout of therapy) TUG cognitive: 20.07 sec (compared to 15 sec last bout of therapy)* 3 M backwards walk:  11.94 sec                                                                                                                              TREATMENT DATE: 12/20/2023    PATIENT EDUCATION: Education details: Initial eval results, POC; encouraged general activity and walking with 4WW; preparation for neurologist visit to discuss symptoms/dizziness/slowed mobility  Person educated: Patient Education method: Explanation Education comprehension: verbalized understanding   GOALS: Goals reviewed with patient? Yes  SHORT TERM GOALS: Target date: 01/18/2024  Pt will be independent with HEP for improved balance, strength, gait. Baseline: Goal status: MET, 01/15/2024  2.  Pt will improve 5x sit<>stand to less than or equal to 12.5 sec to demonstrate improved functional strength and transfer efficiency. Baseline: 15.25 sec; 13.08 sec 01/17/24 Goal status: IN PROGRESS 01/17/24  3.  Pt will improve TUG score to less than or equal to 15 sec for decreased fall risk. Baseline: 19 sec; 16.19 sec 01/17/24>13.34 sec Goal status: MET, 02/05/2024  LONG TERM GOALS: Target date: 02/15/2024  Pt will be independent with HEP for improved balance, strength, gait. Baseline:  Goal status: IN PROGRESS  2.  Pt will improve MiniBESTest score to at least 20/28 to decrease fall risk. Baseline: 13/28 Goal status: IN PROGRESS  3.  Pt will improve gait velocity to at least 2.3 ft/sec for improved gait efficiency and safety.  Baseline: 1.82  ft/sec>2.52 ft/sec Goal status: MET, 02/05/2024  4.  Pt will verbalize plans for continued community fitness to maximize gains made in PT. Baseline:  Goal status: IN PROGRESS   ASSESSMENT:  CLINICAL IMPRESSION: Pt presents today and feels less steady and weaker than last visit; still no dizziness.  This is 8 am appointment, with pt reporting that she  overslept and didn't take PD medication until 7:40 am; she does report she typically has to take morning dose around 7 am in order to be moving better by 8 am.   Skilled PT session focused on slow, sustained PWR! Moves and walking bouts for warm-up as well as balance exercise on static and compliant surface.  Pt is overall more unsteady on compliant surface with stepping activities than last visit.  Pt will continue to benefit from skilled PT towards goals for improved functional mobility and decreased fall risk.   OBJECTIVE IMPAIRMENTS: Abnormal gait, decreased balance, decreased mobility, difficulty walking, decreased strength, and dizziness.   ACTIVITY LIMITATIONS: standing, transfers, and locomotion level  PARTICIPATION LIMITATIONS: meal prep, cleaning, laundry, driving, shopping, and community activity  PERSONAL FACTORS: 3+ comorbidities: see above PMH; increasing dizziness/unsteadiness in past 1-2 months are also affecting patient's functional outcome.   REHAB POTENTIAL: Good  CLINICAL DECISION MAKING: Evolving/moderate complexity  EVALUATION COMPLEXITY: Moderate  PLAN:  PT FREQUENCY: 2x/week  PT DURATION: 8 weeks plus eval visit  PLANNED INTERVENTIONS: 97750- Physical Performance Testing, 97110-Therapeutic exercises, 97530- Therapeutic activity, [id number]- Neuromuscular re-education, 97535- Self Care, 16109- Manual therapy, [phone]- Gait training, Patient/Family education, Balance training, and Vestibular training  PLAN FOR NEXT SESSION:   Check LTGs and discuss POC.  Review and progress HEP (*need to progress to compliant surface  balance for vestibular system retraining), static and dynamic balance with head motions towards goals   Dessie Flow, PT 02/07/24 1:46 PM Phone: [phone] Fax: [phone]  [facility] at [facility] [address] [place], [place] [address] Phone # [phone] Fax # [phone]

## 2024-02-09 ENCOUNTER — Other Ambulatory Visit: Payer: Self-pay | Admitting: Neurology

## 2024-02-09 DIAGNOSIS — G20A1 Parkinson's disease without dyskinesia, without mention of fluctuations: Secondary | ICD-10-CM

## 2024-02-10 ENCOUNTER — Other Ambulatory Visit: Payer: Self-pay | Admitting: Neurology

## 2024-02-10 DIAGNOSIS — G20A1 Parkinson's disease without dyskinesia, without mention of fluctuations: Secondary | ICD-10-CM

## 2024-02-11 ENCOUNTER — Encounter: Payer: Self-pay | Admitting: Physical Therapy

## 2024-02-11 ENCOUNTER — Ambulatory Visit: Admitting: Physical Therapy

## 2024-02-11 DIAGNOSIS — R2681 Unsteadiness on feet: Secondary | ICD-10-CM

## 2024-02-11 DIAGNOSIS — R2689 Other abnormalities of gait and mobility: Secondary | ICD-10-CM | POA: Diagnosis not present

## 2024-02-11 DIAGNOSIS — R29818 Other symptoms and signs involving the nervous system: Secondary | ICD-10-CM | POA: Diagnosis not present

## 2024-02-11 DIAGNOSIS — R42 Dizziness and giddiness: Secondary | ICD-10-CM | POA: Diagnosis not present

## 2024-02-11 DIAGNOSIS — M6281 Muscle weakness (generalized): Secondary | ICD-10-CM | POA: Diagnosis not present

## 2024-02-11 NOTE — Therapy (Signed)
 OUTPATIENT PHYSICAL THERAPY NEURO TREATMENT NOTE   Patient Name: Kristin [name] MRN: [id number] DOB:1950/09/30, 73 y.o., female Today's Date: 02/11/2024   PCP:    Almira Jaeger, MD   REFERRING PROVIDER: Shirline Dover, DO      END OF SESSION:  PT End of Session - 02/11/24 1453     Visit Number 12    Number of Visits 17    Date for PT Re-Evaluation 02/15/24    Authorization Type Medicare/[employer]    PT Start Time 1450    PT Stop Time 1530    PT Time Calculation (min) 40 min    Activity Tolerance Patient tolerated treatment well    Behavior During Therapy [facility] for tasks assessed/performed                        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   Hyperlipidemia    Hypertension    Infertility, female    Low back pain 10/17/2007   Qualifier: Diagnosis of  By: Larrie Po MD, Wilmon Hashimoto    Measles as a child   Obesity    Osteoarthritis    Parkinson disease (HCC)    Plantar fasciitis of left foot 06/19/2012   Preventative health care 12/19/2015   Rheumatoid arthritis (HCC) 01/08/2018   Rosacea 10/05/2014   Swallowing difficulty    Urinary frequency 02/25/2013   Visual floaters 05/11/2014   Past Surgical History:  Procedure Laterality Date   ABDOMINAL HYSTERECTOMY  2006   total   esophageal     stretching   HERNIA REPAIR N/A 2022   hiatal hernia   laporoscopy     LEFT HEART CATH AND CORONARY  ANGIOGRAPHY N/A 06/02/2019   Procedure: LEFT HEART CATH AND CORONARY ANGIOGRAPHY;  Surgeon: Lucendia Rusk, MD;  Location: [facility] IN[facility]AS[facility];  Service: Cardiovascular;  Laterality: N/A;   TONSILLECTOMY     TOTAL HIP ARTHROPLASTY Right 2010   UPPER GASTROINTESTINAL ENDOSCOPY     wisdom teeth extracted     Patient Active Problem List   Diagnosis Date Noted   Vitamin B 12 deficiency 05/30/2021   Anemia 03/16/2020   Coronary artery disease involving native coronary artery of native heart without angina pectoris 06/12/2019   Diastolic dysfunction 05/16/2019   Rheumatoid arthritis (HCC) 01/08/2018   Hand pain, right 10/15/2017   Vitamin D  deficiency 12/20/2016   Prediabetes 12/20/2016   Shortness of breath on exertion 11/27/2016   Morbid obesity (HCC)    Constipation 12/07/2015   BCC (basal cell carcinoma of skin) 10/05/2014   Rosacea 10/05/2014   Parkinson's disease (HCC) 05/11/2014   Screening for cervical cancer 07/17/2012   Foot pain, bilateral 06/19/2012   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   Depression with anxiety 11/02/2009   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   Lumbar  back pain with radiculopathy affecting lower extremity 10/17/2007   Essential hypertension 07/05/2007   Osteoarthritis 07/05/2007   Hyperlipidemia 06/26/2007   H/O: iron  deficiency anemia 05/08/2007   Hiatal hernia with GERD 05/08/2007    ONSET DATE: 11/29/2023 (MD referral)  REFERRING DIAG: G20.A1 (ICD-10-CM) - Parkinson's disease without dyskinesia or fluctuating manifestations (HCC)   THERAPY DIAG:  Unsteadiness on feet  Other abnormalities of gait and mobility  Dizziness and giddiness  Rationale for Evaluation and Treatment: Rehabilitation  SUBJECTIVE:                                                                                                                                                                                             SUBJECTIVE STATEMENT: Feeling okay today-not dizzy,  just feel like I'm moving slower.   Pt accompanied by: self  PERTINENT HISTORY: Parkinson's disease, gastroparesis, anxiety, hx of chest pain, knee OA, RA  PAIN:  Are you having pain? No  PRECAUTIONS: Fall  RED FLAGS: None   WEIGHT BEARING RESTRICTIONS: No  FALLS: Has patient fallen in last 6 months? No  LIVING ENVIRONMENT: Lives with: lives with their spouse Lives in: House/apartment Stairs: stairs in home with rails Has following equipment at home: Otho Blitz - 4 wheeled and bilateral walking poles  PLOF: Independent, Independent with household mobility with device, and Independent with community mobility with device  PATIENT GOALS: To try to combat the slowness and work on the dizziness.  OBJECTIVE:    TODAY'S TREATMENT: 02/11/2024 Activity Comments  NuStep, Level 3, 4 extremities x 8 minutes 2 min warm up, then alternating intervals Level 3/Level 5, 30 sec each  Gait with no device, 3 min  Warm up for increased step length and arm swing; reports fatigue in RLE  On Airex: -feet apart/together EC head turns/nods -feet partial tandem EO/EC head steady 30 sec -Sidestep off/on Airex x 10 -Forward/back step and weightshift, x 10 -Marching in place x 10 -step up/up, down/down x 10, each leg leading Light UE support            HOME EXERCISE PROGRAM: Access Code: [email or web address] URL: [email or web address] Date: 02/11/2024 Prepared by: [facility] - Outpatient  Rehab - [facility]  Program Notes Pick 1-2 paths in your house that you would typically walk.  Count steps in your typical walking pattern.  THEN:  Walk this path with FEWER STEPS is you your goal.*ON YOUR CUSHION:  1) Stand with feet apart and turn your head side to side/up and down with eyes closed.  2)  Face the counter-side step off and on  Exercises - Side Stepping with Counter Support  -  1-2 x daily - 7 x weekly - 1 sets - 2-3 min hold - Backward Walking with Counter Support  - 1-2 x daily - 7 x  weekly - 1 sets - 2-3 min hold - Alternating Step Taps with Counter Support  - 1 x daily - 7 x weekly - 2 sets - 10 reps - Corner Balance Feet Apart: Eyes Closed With Head Turns  - 1 x daily - 7 x weekly - 3 sets - 5 reps - Stride Stance Weight Shift  - 1 x daily - 7 x weekly - 2 sets - 10 reps - Alternating Heel Raises  - 1 x daily - 7 x weekly - 2 sets - 10 reps - Seated Gaze Stabilization with Head Rotation  - 1 x daily - 5 x weekly - 2-3 sets - 20-30 sec  hold - Feet Together Balance at [employer] Eyes Closed  - 1 x daily - 7 x weekly - 3 sets - 5 reps - Standing Gastroc Stretch at Counter  - 1-2 x daily - 7 x weekly - 1 sets - 3 reps - 30 sec hold     PATIENT EDUCATION: Education details: Additions to HEP; Discussed ways to encourage HEP more consistently throughout her day-discussed using the reminder app on her phone Person educated: Patient Education method: Explanation, Demonstration, and Handouts Education comprehension: verbalized understanding, returned demonstration, and needs further education  -------------------------------------------------- TODAY'S TREATMENT: 01/17/24 Activity Comments  5xSTS  13.08 sec with posterior LOB into chair on last rep  TUG 16.19 sec without AD                 VESTIBULAR ASSESSMENT   GENERAL OBSERVATION: pt wears progressives    OCULOMOTOR EXAM: Ocular Alignment: Cover/Uncover Test: WNL Cover/Cross Cover Test: WNL   Ocular ROM: No Limitations   Spontaneous Nystagmus: absent   Gaze-Induced Nystagmus: absent   Smooth Pursuits: intact   Saccades: intact (c/o "tipping" and dizzy with horizontal direction"   Convergence/Divergence: 24 cm ; L convergence insufficiency    VESTIBULAR - OCULAR REFLEX:    Slow VOR: Normal; c/o dizziness horizontal    VOR Cancellation: Normal   Head-Impulse Test: possible +R; difficult to accurately test d/t guarding       POSITIONAL TESTING:  Right Roll Test: negative Left Roll Test: negative    Right Sidelying: negative  Left Sidelying: negative   ------------------------------------------    PATIENT EDUCATION: Education details: 01/22/2024:  Review HEP update, instructions on optimal VOR performance, rationale for balance/vestibular targeted exercises, with decreased vestibular system use on MCTSIB test Person educated: Patient Education method: Explanation, Demonstration, Tactile cues, Verbal cues, and Handouts Education comprehension: verbalized understanding and returned demonstration    Ways to offset your blood pressure fluctuations (lowering):  1)  Talk to Dr. Arlene Ben  2)  Stay well hydrated through your day with plenty of water  3)  If you sit for long periods-do several exercises for your legs before you stand up  -ankle pumps  -seated march  -seated leg kicks  4)  When you transition between lying down to sitting up or sitting to standing, take a moment to "get your bearings" to get set before you begin to move  5)  ??Consider asking MD about abdominal binder or compression socks ??-Probably not needed, given BP measures 01/15/2024  ---------------------------------------------------- Note: Objective measures were completed at Evaluation unless otherwise noted.  DIAGNOSTIC FINDINGS: NA for this episode  COGNITION: Overall cognitive status: Within functional limits for  tasks assessed   SENSATION: Light touch: WFL  EDEMA: Pt reports some BLE swelling and being monitored by MD   POSTURE: rounded shoulders and forward head  VITALS:   142/90, HR 71 bpm Seated   144/79  HR 74 bpm  Standing    LOWER EXTREMITY ROM:   AROM WFL   LOWER EXTREMITY MMT:  Grossly tested at least 4/5 BLEs   TRANSFERS: Sit to stand: Modified independence  Assistive device utilized: None     Stand to sit: Modified independence  Assistive device utilized: None     *Reports she needs to use hands most times to push up to stand from chairs at home*  GAIT:   FUNCTIONAL  TESTS:  5 times sit to stand: 15.25 sec Timed up and go (TUG): 19.35 sed with walking pole (compared to 14 sec last bout of therapy)* 10 meter walk test: 17.98 sec (1.82 ft/sec) *compared to 2.52 ft/sec last bout of therapy* Mini-BESTest: 13/24 (decreased from 24/28 from last bout of therapy) TUG cognitive: 20.07 sec (compared to 15 sec last bout of therapy)* 3 M backwards walk:  11.94 sec                                                                                                                              TREATMENT DATE: 12/20/2023    PATIENT EDUCATION: Education details: Initial eval results, POC; encouraged general activity and walking with 4WW; preparation for neurologist visit to discuss symptoms/dizziness/slowed mobility  Person educated: Patient Education method: Explanation Education comprehension: verbalized understanding   GOALS: Goals reviewed with patient? Yes  SHORT TERM GOALS: Target date: 01/18/2024  Pt will be independent with HEP for improved balance, strength, gait. Baseline: Goal status: MET, 01/15/2024  2.  Pt will improve 5x sit<>stand to less than or equal to 12.5 sec to demonstrate improved functional strength and transfer efficiency. Baseline: 15.25 sec; 13.08 sec 01/17/24 Goal status: IN PROGRESS 01/17/24  3.  Pt will improve TUG score to less than or equal to 15 sec for decreased fall risk. Baseline: 19 sec; 16.19 sec 01/17/24>13.34 sec Goal status: MET, 02/05/2024  LONG TERM GOALS: Target date: 02/15/2024  Pt will be independent with HEP for improved balance, strength, gait. Baseline:  Goal status: IN PROGRESS  2.  Pt will improve MiniBESTest score to at least 20/28 to decrease fall risk. Baseline: 13/28 Goal status: IN PROGRESS  3.  Pt will improve gait velocity to at least 2.3 ft/sec for improved gait efficiency and safety.  Baseline: 1.82 ft/sec>2.52 ft/sec Goal status: MET, 02/05/2024  4.  Pt will verbalize plans for continued community  fitness to maximize gains made in PT. Baseline:  Goal status: IN PROGRESS   ASSESSMENT:  CLINICAL IMPRESSION: Pt presents today and reports feeling somewhat slowed compared to last week; she reports she didn't sleep well last night.  Skilled PT session focused on aerobic warm up using NuStep and walking; then worked on compliant surface  balance.  Pt has increased tremors with static balance on Airex, and needs light UE support for optimal steadiness.  She will continue to benefit from skilled PT towards goals for improved functional mobility and decreased fall risk.   OBJECTIVE IMPAIRMENTS: Abnormal gait, decreased balance, decreased mobility, difficulty walking, decreased strength, and dizziness.   ACTIVITY LIMITATIONS: standing, transfers, and locomotion level  PARTICIPATION LIMITATIONS: meal prep, cleaning, laundry, driving, shopping, and community activity  PERSONAL FACTORS: 3+ comorbidities: see above PMH; increasing dizziness/unsteadiness in past 1-2 months are also affecting patient's functional outcome.   REHAB POTENTIAL: Good  CLINICAL DECISION MAKING: Evolving/moderate complexity  EVALUATION COMPLEXITY: Moderate  PLAN:  PT FREQUENCY: 2x/week  PT DURATION: 8 weeks plus eval visit  PLANNED INTERVENTIONS: 97750- Physical Performance Testing, 97110-Therapeutic exercises, 97530- Therapeutic activity, 97112- Neuromuscular re-education, 97535- Self Care, [id number]- Manual therapy, [phone]- Gait training, Patient/Family education, Balance training, and Vestibular training  PLAN FOR NEXT SESSION:   Check LTGs and discuss POC (pt would like to continue-consider at 1x/wk frequency)  Review HEP and discuss overall HEP plan/PD fitness recommendations for weekly fitness.  Pt has questions about ankle weights she has at home (consider adding to HEP for BLE strengthening)   Dessie Flow, PT 02/11/24 4:01 PM Phone: [phone] Fax: [phone]  [facility] at [facility]  Neuro [address] [place], [address] [place], [place] [address] Phone # [phone] Fax # [phone]

## 2024-02-14 ENCOUNTER — Ambulatory Visit: Admitting: Physical Therapy

## 2024-02-14 ENCOUNTER — Encounter: Payer: Self-pay | Admitting: Physical Therapy

## 2024-02-14 DIAGNOSIS — R42 Dizziness and giddiness: Secondary | ICD-10-CM | POA: Diagnosis not present

## 2024-02-14 DIAGNOSIS — R2689 Other abnormalities of gait and mobility: Secondary | ICD-10-CM | POA: Diagnosis not present

## 2024-02-14 DIAGNOSIS — R29818 Other symptoms and signs involving the nervous system: Secondary | ICD-10-CM

## 2024-02-14 DIAGNOSIS — R2681 Unsteadiness on feet: Secondary | ICD-10-CM

## 2024-02-14 DIAGNOSIS — M6281 Muscle weakness (generalized): Secondary | ICD-10-CM

## 2024-02-14 NOTE — Therapy (Signed)
 OUTPATIENT PHYSICAL THERAPY NEURO TREATMENT NOTE/RECERT   Patient Name: Kristin [name] MRN: [id number] DOB:01-05-1951, 73 y.o., female Today's Date: 02/14/2024   PCP:    Almira Jaeger, MD   REFERRING PROVIDER: Shirline Dover, DO      END OF SESSION:  PT End of Session - 02/14/24 0806     Visit Number 13    Number of Visits 17    Date for PT Re-Evaluation 03/21/24    Authorization Type Medicare/[employer]et[employer]    PT Start Time 0805    PT Stop Time 0848    PT Time Calculation (min) 43 min    Activity Tolerance Patient tolerated treatment well    Behavior During Therapy [facility] for tasks assessed/performed                      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   Hyperlipidemia    Hypertension    Infertility, female    Low back pain 10/17/2007   Qualifier: Diagnosis of  By: Larrie Po MD, Wilmon Hashimoto    Measles as a child   Obesity    Osteoarthritis    Parkinson disease (HCC)    Plantar fasciitis of left foot 06/19/2012   Preventative health care 12/19/2015   Rheumatoid arthritis (HCC) 01/08/2018   Rosacea 10/05/2014   Swallowing difficulty    Urinary frequency 02/25/2013   Visual floaters 05/11/2014   Past Surgical History:  Procedure Laterality Date   ABDOMINAL HYSTERECTOMY  2006   total   esophageal     stretching   HERNIA REPAIR N/A 2022   hiatal hernia   laporoscopy     LEFT HEART CATH AND  CORONARY ANGIOGRAPHY N/A 06/02/2019   Procedure: LEFT HEART CATH AND CORONARY ANGIOGRAPHY;  Surgeon: Lucendia Rusk, MD;  Location: [facility];  Service: Cardiovascular;  Laterality: N/A;   TONSILLECTOMY     TOTAL HIP ARTHROPLASTY Right 2010   UPPER GASTROINTESTINAL ENDOSCOPY     wisdom teeth extracted     Patient Active Problem List   Diagnosis Date Noted   Vitamin B 12 deficiency 05/30/2021   Anemia 03/16/2020   Coronary artery disease involving native coronary artery of native heart without angina pectoris 06/12/2019   Diastolic dysfunction 05/16/2019   Rheumatoid arthritis (HCC) 01/08/2018   Hand pain, right 10/15/2017   Vitamin D  deficiency 12/20/2016   Prediabetes 12/20/2016   Shortness of breath on exertion 11/27/2016   Morbid obesity (HCC)    Constipation 12/07/2015   BCC (basal cell carcinoma of skin) 10/05/2014   Rosacea 10/05/2014   Parkinson's disease (HCC) 05/11/2014   Screening for cervical cancer 07/17/2012   Foot pain, bilateral 06/19/2012   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   Depression with anxiety 11/02/2009   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   Lumbar back pain  with radiculopathy affecting lower extremity 10/17/2007   Essential hypertension 07/05/2007   Osteoarthritis 07/05/2007   Hyperlipidemia 06/26/2007   H/O: iron  deficiency anemia 05/08/2007   Hiatal hernia with GERD 05/08/2007    ONSET DATE: 11/29/2023 (MD referral)  REFERRING DIAG: G20.A1 (ICD-10-CM) - Parkinson's disease without dyskinesia or fluctuating manifestations (HCC)   THERAPY DIAG:  Unsteadiness on feet  Other abnormalities of gait and mobility  Muscle weakness (generalized)  Other symptoms and signs involving the nervous system  Rationale for Evaluation and Treatment: Rehabilitation  SUBJECTIVE:                                                                                                                                                                                              SUBJECTIVE STATEMENT: Feel good, and I slept a full night last night.  Don't feel the dizziness that I had before.  Pt accompanied by: self  PERTINENT HISTORY: Parkinson's disease, gastroparesis, anxiety, hx of chest pain, knee OA, RA  PAIN:  Are you having pain? No  PRECAUTIONS: Fall  RED FLAGS: None   WEIGHT BEARING RESTRICTIONS: No  FALLS: Has patient fallen in last 6 months? No  LIVING ENVIRONMENT: Lives with: lives with their spouse Lives in: House/apartment Stairs: stairs in home with rails Has following equipment at home: Otho Blitz - 4 wheeled and bilateral walking poles  PLOF: Independent, Independent with household mobility with device, and Independent with community mobility with device  PATIENT GOALS: To try to combat the slowness and work on the dizziness.  OBJECTIVE:    TODAY'S TREATMENT: 02/14/2024 Activity Comments  5x sit to stand:  11.56 sec Improved from 13 sec  TUG:  13.48 sec TUG cognitive:  14.54 sec Improved from 19-20 sec  10 M:  12.32 sec (2.67 ft/sec)   3 M walk back:  6.91 sec Improved from 11.9 sec  MiniBESTest 23/28 Improved from 13/28  Review of HEP Good return demo, pt has them organized in groups and performs full HEP  Standing on [employer]:  feet together with head turns Increased sway, UE support  SLS with foot propped on 6 step, 2 x 10 sec        HOME EXERCISE PROGRAM: Access Code: [email or web address] URL: [email or web address] Date: 02/11/2024 Prepared by: [facility] - Outpatient  Rehab - [facility]  Program Notes Pick 1-2 paths in your house that you would typically walk.  Count steps in your typical walking pattern.  THEN:  Walk this path with FEWER STEPS is you your goal.*ON YOUR CUSHION:  1) Stand with feet apart and turn your head side to side/up and down with  eyes closed.  2)  Face the counter-side step off and on  Exercises - Side Stepping with Counter Support  - 1-2 x daily - 7 x weekly - 1 sets -  2-3 min hold - Backward Walking with Counter Support  - 1-2 x daily - 7 x weekly - 1 sets - 2-3 min hold - Alternating Step Taps with Counter Support  - 1 x daily - 7 x weekly - 2 sets - 10 reps *HOLD 10 SEC* - Corner Balance Feet Apart: Eyes Closed With Head Turns  - 1 x daily - 7 x weekly - 3 sets - 5 reps *ON CUSHION* - Stride Stance Weight Shift  - 1 x daily - 7 x weekly - 2 sets - 10 reps - Alternating Heel Raises  - 1 x daily - 7 x weekly - 2 sets - 10 reps - Seated Gaze Stabilization with Head Rotation  - 1 x daily - 5 x weekly - 2-3 sets - 20-30 sec  hold - Feet Together Balance at [employer] Eyes Closed  - 1 x daily - 7 x weekly - 3 sets - 5 reps *ON CUSHION* - Standing Gastroc Stretch at Counter  - 1-2 x daily - 7 x weekly - 1 sets - 3 reps - 30 sec hold     PATIENT EDUCATION: Education details: Additions to HEP; Discussed ways to encourage HEP more consistently throughout her day-discussed using the reminder app on her phone Person educated: Patient Education method: Explanation, Demonstration, and Handouts Education comprehension: verbalized understanding, returned demonstration, and needs further education  -------------------------------------------------- TODAY'S TREATMENT: 01/17/24 Activity Comments  5xSTS  13.08 sec with posterior LOB into chair on last rep  TUG 16.19 sec without AD                 VESTIBULAR ASSESSMENT   GENERAL OBSERVATION: pt wears progressives    OCULOMOTOR EXAM: Ocular Alignment: Cover/Uncover Test: WNL Cover/Cross Cover Test: WNL   Ocular ROM: No Limitations   Spontaneous Nystagmus: absent   Gaze-Induced Nystagmus: absent   Smooth Pursuits: intact   Saccades: intact (c/o tipping and dizzy with horizontal direction   Convergence/Divergence: 24 cm ; L convergence insufficiency    VESTIBULAR - OCULAR REFLEX:    Slow VOR: Normal; c/o dizziness horizontal    VOR Cancellation: Normal   Head-Impulse Test: possible +R; difficult to  accurately test d/t guarding       POSITIONAL TESTING:  Right Roll Test: negative Left Roll Test: negative   Right Sidelying: negative  Left Sidelying: negative   ------------------------------------------    PATIENT EDUCATION: Education details: 01/22/2024:  Review HEP update, instructions on optimal VOR performance, rationale for balance/vestibular targeted exercises, with decreased vestibular system use on MCTSIB test Person educated: Patient Education method: Explanation, Demonstration, Tactile cues, Verbal cues, and Handouts Education comprehension: verbalized understanding and returned demonstration    Ways to offset your blood pressure fluctuations (lowering):  1)  Talk to Dr. Arlene Ben  2)  Stay well hydrated through your day with plenty of water  3)  If you sit for long periods-do several exercises for your legs before you stand up  -ankle pumps  -seated march  -seated leg kicks  4)  When you transition between lying down to sitting up or sitting to standing, take a moment to get your bearings to get set before you begin to move  5)  ??Consider asking MD about abdominal binder or compression socks ??-Probably not needed, given BP measures  01/15/2024  ---------------------------------------------------- Note: Objective measures were completed at Evaluation unless otherwise noted.  DIAGNOSTIC FINDINGS: NA for this episode  COGNITION: Overall cognitive status: Within functional limits for tasks assessed   SENSATION: Light touch: WFL  EDEMA: Pt reports some BLE swelling and being monitored by MD   POSTURE: rounded shoulders and forward head  VITALS:   142/90, HR 71 bpm Seated   144/79  HR 74 bpm  Standing    LOWER EXTREMITY ROM:   AROM WFL   LOWER EXTREMITY MMT:  Grossly tested at least 4/5 BLEs   TRANSFERS: Sit to stand: Modified independence  Assistive device utilized: None     Stand to sit: Modified independence  Assistive device utilized: None      *Reports she needs to use hands most times to push up to stand from chairs at home*  GAIT:   FUNCTIONAL TESTS:  5 times sit to stand: 15.25 sec Timed up and go (TUG): 19.35 sed with walking pole (compared to 14 sec last bout of therapy)* 10 meter walk test: 17.98 sec (1.82 ft/sec) *compared to 2.52 ft/sec last bout of therapy* Mini-BESTest: 13/24 (decreased from 24/28 from last bout of therapy) TUG cognitive: 20.07 sec (compared to 15 sec last bout of therapy)* 3 M backwards walk:  11.94 sec                                                                                                                              TREATMENT DATE: 12/20/2023    PATIENT EDUCATION: Education details: Initial eval results, POC; encouraged general activity and walking with 4WW; preparation for neurologist visit to discuss symptoms/dizziness/slowed mobility  Person educated: Patient Education method: Explanation Education comprehension: verbalized understanding   GOALS: Goals reviewed with patient? Yes  SHORT TERM GOALS: Target date: 01/18/2024  Pt will be independent with HEP for improved balance, strength, gait. Baseline: Goal status: MET, 01/15/2024  2.  Pt will improve 5x sit<>stand to less than or equal to 12.5 sec to demonstrate improved functional strength and transfer efficiency. Baseline: 15.25 sec; 13.08 sec 01/17/24 Goal status: IN PROGRESS 01/17/24  3.  Pt will improve TUG score to less than or equal to 15 sec for decreased fall risk. Baseline: 19 sec; 16.19 sec 01/17/24>13.34 sec Goal status: MET, 02/05/2024  LONG TERM GOALS: Target date: 02/15/2024  Pt will be independent with HEP for improved balance, strength, gait. Baseline:  Goal status: MET, 02/14/2024  2.  Pt will improve MiniBESTest score to at least 20/28 to decrease fall risk. Baseline: 13/28>23/28 Goal status: MET, 02/14/2024  3.  Pt will improve gait velocity to at least 2.3 ft/sec for improved gait efficiency and safety.   Baseline: 1.82 ft/sec>2.52 ft/sec Goal status: MET, 02/05/2024  4.  Pt will verbalize plans for continued community fitness to maximize gains made in PT. Baseline:  Goal status: IN PROGRESS, 02/14/2024  5.  Pt will improve 3 MWT backwards to less than or equal  to 5 sec, for decreased fall risk.  Baseline:  6.91 sec  Goal status:  INITIAL, 02/14/2024  6.  Pt will improve SLS to at least 5 sec for improved obstacle and step negotiation.  Baseline:  3-4 sec  Goal status:  INITIAL, 02/14/2024   ASSESSMENT:  CLINICAL IMPRESSION: Pt presents today with no new complaints.  She notes overall improved balance and confidence.  Skilled PT session focused on assessing LTGs, with pt meeting 3 of 4 initial goals.  LTG 4 in progress for continued community fitness.  She has improved objective measures for balance, with MiniBESTest 23/28, improved from 13/28, TUG 13.48 sec improved from 19 sec, 3 M walk backwards improved from 11.9>6.9 sec.  Pt improved 5x sit<>stand to 11.5 sec today, but reports more weakness R side, decreased SLS.   Pt would continue to benefit from skilled PT towards goals for improved functional mobility and decreased fall risk.  Note updated goals for improved balance and strength.     OBJECTIVE IMPAIRMENTS: Abnormal gait, decreased balance, decreased mobility, difficulty walking, decreased strength, and dizziness.   ACTIVITY LIMITATIONS: standing, transfers, and locomotion level  PARTICIPATION LIMITATIONS: meal prep, cleaning, laundry, driving, shopping, and community activity  PERSONAL FACTORS: 3+ comorbidities: see above PMH; increasing dizziness/unsteadiness in past 1-2 months are also affecting patient's functional outcome.   REHAB POTENTIAL: Good  CLINICAL DECISION MAKING: Evolving/moderate complexity  EVALUATION COMPLEXITY: Moderate  PLAN:  PT FREQUENCY: 2x/week  PT DURATION: 8 weeks plus eval visit  PLANNED INTERVENTIONS: 97750- Physical Performance Testing,  97110-Therapeutic exercises, 97530- Therapeutic activity, [id number]- Neuromuscular re-education, 97535- Self Care, 82956- Manual therapy, [phone]- Gait training, Patient/Family education, Balance training, and Vestibular training  PLAN FOR NEXT SESSION:    Review HEP and discuss overall HEP plan/PD fitness recommendations for weekly fitness.  Pt has questions about ankle weights she has at home (consider adding to HEP for BLE strengthening)   Dessie Flow, PT 02/14/24 8:58 AM Phone: [phone] Fax: [phone]  [facility] at [facility] [address] [place], [address] [place], [place] [address] Phone # [phone] Fax # [phone]

## 2024-02-17 NOTE — Progress Notes (Unsigned)
 Virtual Visit via Video Note  I connected with Kristin [name] on 02/22/24 at 10:00 AM EDT by a video enabled telemedicine application and verified that I am speaking with the correct person using two identifiers.  Location: Patient: home Provider: home office Persons participated in the visit- patient, provider    I discussed the limitations of evaluation and management by telemedicine and the availability of in person appointments. The patient expressed understanding and agreed to proceed.   I discussed the assessment and treatment plan with the patient. The patient was provided an opportunity to ask questions and all were answered. The patient agreed with the plan and demonstrated an understanding of the instructions.   The patient was advised to call back or seek an in-person evaluation if the symptoms worsen or if the condition fails to improve as anticipated.   Todd Fossa, MD    [facility] MD/PA/NP OP Progress Note  02/22/2024 12:42 PM Kristin [name]  MRN:  [id number]  Chief Complaint:  Chief Complaint  Patient presents with   Follow-up   HPI:  - According to the chart review, the following events have occurred since the last visit: The patient was seen by Dr. Arlene Ben. Losartan  was reduced and then advised to hold completely due to concern of orthostasis  This is a follow-up appointment for depression and anxiety.  She states that her dizziness is gone.  She is working with physical therapist.  She is shuffling, and takes time to move.  She tries to go out every day.  She had a good time on picnic.  She also states her friend from [place] once a week.  Although she was recommended to walk outside by a physical therapist, she has not done so as she is concerned about uneven surface.  However, she is trying to work on this.  Her husband is willing to come with her.  She has not had any fall over the last few years.  She is planning to attend a chair yoga class.  She is also trying to  get rid of fear.  Although she used to have ruminating thoughts, it is calmer.  She reports some concern about her oldest daughter, who yells.  However, she also feels nervous if she does not hear from her.  She will be going to the beach with them and her family.  She feels her mood has been better, and not as depressed.  She has better sleep.  She continues to feel tired at times.  Although she has occasional diaphoresis, it is not concerning as she used.  She denies SI, HI, hallucinations.   Household: son with autistic trait (employed) Marital status:married Number of children:3    Substance use   Tobacco Alcohol Other substances/  Current   Occasionally, a glass of wine denies  Past        Past Treatment           Visit Diagnosis:    ICD-10-CM   1. MDD (major depressive disorder), recurrent, in partial remission (HCC)  F33.41     2. Anxiety disorder, unspecified type  F41.9       Past Psychiatric History: Please see initial evaluation for full details. I have reviewed the history. No updates at this time.     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   Hyperlipidemia    Hypertension    Infertility, female    Low back pain 10/17/2007   Qualifier: Diagnosis of  By: Larrie Po MD, Wilmon Hashimoto    Measles as a child   Obesity    Osteoarthritis    Parkinson disease (HCC)    Plantar fasciitis of left foot  06/19/2012   Preventative health care 12/19/2015   Rheumatoid arthritis (HCC) 01/08/2018   Rosacea 10/05/2014   Swallowing difficulty    Urinary frequency 02/25/2013   Visual floaters 05/11/2014    Past Surgical History:  Procedure Laterality Date   ABDOMINAL HYSTERECTOMY  2006   total   esophageal     stretching   HERNIA REPAIR N/A 2022   hiatal hernia   laporoscopy     LEFT HEART CATH AND CORONARY ANGIOGRAPHY N/A 06/02/2019   Procedure: LEFT HEART CATH AND CORONARY ANGIOGRAPHY;  Surgeon: Lucendia Rusk, MD;  Location: [facility];  Service: Cardiovascular;  Laterality: N/A;   TONSILLECTOMY     TOTAL HIP ARTHROPLASTY Right 2010   UPPER GASTROINTESTINAL ENDOSCOPY     wisdom teeth extracted      Family Psychiatric History: Please see initial evaluation for full details. I have reviewed the history. No updates at this time.     Family History:  Family History  Problem Relation Age of Onset   Other Mother        arrythmia   Mental illness Mother        bipolar   Hyperlipidemia Mother    Thyroid  disease Mother    Depression Mother    Bipolar disorder Mother    Breast cancer Mother 92   Heart disease Father    Arthritis Father        rheumatoid   Hypertension Father    Hyperlipidemia Father    Depression Sister    Mental illness Sister        bipolar   Parkinson's disease Sister    Arthritis Sister    Arthritis Sister    Arthritis Sister    Breast cancer Maternal Aunt 35   Breast cancer Maternal Uncle        67s   Heart attack Paternal Uncle    Heart attack Paternal Grandfather    Breast cancer Cousin 5   Colon cancer Neg Hx    Esophageal cancer Neg Hx    Rectal cancer Neg Hx    Stomach cancer Neg Hx    Sleep apnea Neg Hx     Social History:  Social History   Socioeconomic History   Marital status: Married    Spouse name: Not on file   Number of children: 1   Years of education: Not on file   Highest education level: Master's degree  (e.g., MA, MS, MEng, MEd, MSW, MBA)  Occupational History   Occupation: Retired International aid/development worker  Tobacco Use   Smoking status: Never   Smokeless tobacco: Never  Advertising account planner  Vaping status: Never Used  Substance and Sexual Activity   Alcohol use: Yes    Alcohol/week: 1.0 standard drink of alcohol    Types: 1 Glasses of wine per week    Comment: occasional wine   Drug use: No   Sexual activity: Not Currently    Partners: Male  Other Topics Concern   Not on file  Social History Narrative   Lives with husband, 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 3 grandsons- 2 in [place] and 54 year old in [place] in 2023      Hobbies: book club, time with friends, time with couples friends, plays pianos   Social Drivers of Corporate investment banker Strain: Low Risk  (11/06/2023)   Overall Financial Resource Strain (CARDIA)    Difficulty of Paying Living Expenses: Not hard at all  Food Insecurity: No Food Insecurity (11/06/2023)   Hunger Vital Sign    Worried About Running Out of Food in the Last Year: Never true    Ran Out of Food in the Last Year: Never true  Transportation Needs: No Transportation Needs (11/06/2023)   PRAPARE - Administrator, Civil Service (Medical): No    Lack of Transportation (Non-Medical): No  Physical Activity: Sufficiently Active (11/06/2023)   Exercise Vital Sign    Days of Exercise per Week: 5 days    Minutes of Exercise per Session: 40 min  Stress: No Stress Concern Present (11/06/2023)   [employer] of Occupational Health - Occupational Stress Questionnaire    Feeling of Stress : Only a little  Social Connections: Unknown (11/06/2023)   Social Connection and Isolation Panel    Frequency of Communication with Friends and Family: More than three times a week    Frequency of Social Gatherings with Friends and Family: Three times a week    Attends Religious  Services: Patient declined    Active Member of Clubs or Organizations: Yes    Attends Banker Meetings: 1 to 4 times per year    Marital Status: Married    Allergies:  Allergies  Allergen Reactions   Scopolamine Other (See Comments)    Panic hallu  Other Reaction(s): Hallucination, Not available   Prednisone  Dermatitis    Facial ulcers  Other Reaction(s): facial swelling   Bactrim [Sulfamethoxazole-Trimethoprim] Other (See Comments)    Oral ulcers and rash   Penicillins Hives and Other (See Comments)    Did it involve swelling of the face/tongue/throat, SOB, or low BP? No  Did it involve sudden or severe rash/hives, skin peeling, or any reaction on the inside of your mouth or nose? No  Did you need to seek medical attention at a hospital or doctor's office? No  When did it last happen?      30+ years ago  If all above answers are "NO", may proceed with cephalosporin use.   Sulfa Antibiotics     Other Reaction(s): Not available    Metabolic Disorder Labs: Lab Results  Component Value Date   HGBA1C 6.2 01/03/2024   MPG 126 (H) 12/29/2013   No results found for: PROLACTIN Lab Results  Component Value Date   CHOL 133 01/03/2024   TRIG 88.0 01/03/2024   HDL 54.20 01/03/2024   CHOLHDL 2 01/03/2024   VLDL 17.6 01/03/2024   LDLCALC 61 01/03/2024   LDLCALC 60 03/28/2023   Lab Results  Component Value  Date   TSH 1.10 01/12/2022   TSH 1.20 10/03/2021    Therapeutic Level Labs: No results found for: LITHIUM No results found for: VALPROATE No results found for: CBMZ  Current Medications: Current Outpatient Medications  Medication Sig Dispense Refill   carbidopa -levodopa  (SINEMET  CR) 50-200 MG tablet TAKE 1 TABLET BY MOUTH EVERYDAY AT BEDTIME 90 tablet 0   carbidopa -levodopa  (SINEMET  IR) 25-100 MG tablet TAKE 2 TABLETS BY MOUTH AT 7AM, 2 TABLETS AT 11AM, AND 1 TABLET AT 4PM 450 tablet 0   carvedilol  (COREG ) 3.125 MG tablet TAKE 1 TABLET BY  MOUTH TWICE A DAY WITH FOOD 180 tablet 3   DULoxetine  (CYMBALTA ) 20 MG capsule Take 1 capsule (20 mg total) by mouth daily. 90 capsule 0   entacapone  (COMTAN ) 200 MG tablet Take 1 tablet (200 mg total) by mouth 3 (three) times daily. 270 tablet 1   escitalopram  (LEXAPRO ) 20 MG tablet Take 1 tablet (20 mg total) by mouth daily. 90 tablet 3   losartan  (COZAAR ) 50 MG tablet TAKE 1 TABLET BY MOUTH EVERY DAY (Patient taking differently: Take 25 mg by mouth daily.) 90 tablet 3   omeprazole  (PRILOSEC) 20 MG capsule TAKE 1 CAPSULE BY MOUTH 2 TIMES DAILY AS NEEDED. 180 capsule 1   Plecanatide  (TRULANCE ) 3 MG TABS Take 1 tablet (3 mg total) by mouth daily. 30 tablet 3   polyethylene glycol (MIRALAX  / GLYCOLAX ) 17 g packet Take 17 g by mouth daily as needed for mild constipation.     pramipexole  (MIRAPEX ) 0.5 MG tablet TAKE 1 TABLET BY MOUTH THREE TIMES A DAY 270 tablet 0   rosuvastatin  (CRESTOR ) 10 MG tablet TAKE 1 TABLET BY MOUTH ON MONDAYS,WEDNESDAYS, AND FRIDAYS 39 tablet 3   Vitamin D , Ergocalciferol , (DRISDOL ) 1.25 MG (50000 UNIT) CAPS capsule TAKE 1 CAPSULE (50,000 UNITS TOTAL) BY MOUTH EVERY 7 (SEVEN) DAYS 12 capsule 1   No current facility-administered medications for this visit.     Musculoskeletal: Strength & Muscle Tone: N/A Gait & Station: N/A Patient leans: N/A  Psychiatric Specialty Exam: Review of Systems  Psychiatric/Behavioral:  Negative for agitation, behavioral problems, confusion, decreased concentration, dysphoric mood, hallucinations, self-injury, sleep disturbance and suicidal ideas. The patient is nervous/anxious. The patient is not hyperactive.   All other systems reviewed and are negative.   There were no vitals taken for this visit.There is no height or weight on file to calculate BMI.  General Appearance: Well Groomed  Eye Contact:  Good  Speech:  Clear and Coherent  Volume:  Normal  Mood:  good  Affect:  Appropriate, Congruent, and Full Range  Thought Process:   Coherent  Orientation:  Full (Time, Place, and Person)  Thought Content: Logical   Suicidal Thoughts:  No  Homicidal Thoughts:  No  Memory:  Immediate;   Good  Judgement:  Good  Insight:  Good  Psychomotor Activity:  Normal  Concentration:  Concentration: Good and Attention Span: Good  Recall:  Good  Fund of Knowledge: Good  Language: Good  Akathisia:  No  Handed:  Right  AIMS (if indicated): not done  Assets:  Communication Skills Desire for Improvement  ADL's:  Intact  Cognition: WNL  Sleep:  Good   Screenings: GAD-7    Flowsheet Row Office Visit from 05/09/2023 in [facility] [employer] from 03/28/2023 in [facility] [employer] from 01/23/2023 in [facility]  Total GAD-7  Score 5 6 0   Mini-Mental    Flowsheet Row Office Visit from 10/15/2017 in [facility]  Total Score (max 30 points ) 30   PHQ2-9    Flowsheet Row Office Visit from 11/06/2023 in [facility] [employer] Visit from 05/09/2023 in [facility] [employer] Visit from 03/28/2023 in [facility] Clinical Support from 03/06/2023 in [facility] [employer] Visit from 01/23/2023 in [facility] [place]  PHQ-2 Total Score 0 0 4 0 0  PHQ-9 Total Score 8 9 15  0 0   Flowsheet Row ED from 10/10/2023 in [facility] Department at [facility] ED from 03/22/2023 in [facility] Department at [facility] ED from 12/10/2020 in [facility] Emergency Department at [facility]  C-SSRS RISK CATEGORY No Risk No Risk No Risk     Assessment and Plan:  Kristin [name] is a 73 y.o. year old female with a history of depression, anxiety, parkinson, seronegative, Nonerosive RA in remission,   hypertension, vitamin B 12/D deficiency, gastroparesis, who presents for the follow-up appointment for below.   1. MDD (major depressive disorder), recurrent, in partial remission (HCC) 2. Anxiety disorder, unspecified type  The patient reports dizziness, xerostomia, indigestion, Parkinsonian symptoms, and a fear of falling. Her mother has a history of bipolar disorder. Psychologically, she demonstrates a longstanding pattern of people-pleasing behaviors and struggles with setting boundaries. Socially, she has adopted her daughter. Developmentally, she grew up with a very strict father who tended to yell. As the oldest of her five siblings, she took on a caregiving role for her younger siblings   History: suffers from depression for many years, Originally on duloxetine  30 mg daily, lexapro  20 mg daily. Relapse in mood symptoms after trying monotherapy    There has been overall improvement in depressive symptoms and anxiety.  She is actively engaged in physical therapy and is making efforts to walk more despite her fear of falling.  To prioritize her functioning, will not make any adjustments in her medication at this time while it is preferable to avoid polypharmacy of taking both SSRI/SNRI.  The hope is to taper off duloxetine  to avoid polypharmacy.  Noted that she had some relapse in her symptoms while tapering off Lexapro  in the past.  Will continue duloxetine  and Lexapro  to target depression and anxiety.  Previously discussed the risk of serotonin syndrome, QTc prolongation.  Psychoeducation was provided on the concept of self-compassion and the neurobiological rationale that completing even small tasks can activate the brain's reward system, reinforcing motivation and promoting emotional well-being. The importance of consistently engaging in manageable goals was emphasized.  3. Chronic fatigue 4. Screening for iron  deficiency anemia She continues to experience fatigue, which could be multifactorial.   Given she is at risk of iron  deficiency given limited p.o. intake, gastroparesis, she was advised again to obtain lab.     Plan Continue duloxetine  20 mg daily - reduced 07/2023 Continue lexapro  20 mg daily (EKG HR 72, QTc 448 msec, 01/2024) Obtain labs- ferritin at [facility] Next appointment: 8/8 at 10 30, video She sees a therapist, Delories Fetter-  [facility], every two weeks   The patient demonstrates the following risk factors for suicide: Chronic risk factors for suicide include: psychiatric disorder of depression, anxiety . Acute risk factors  for suicide include: loss (financial, interpersonal, professional). Protective factors for this patient include: positive social support, responsibility to others (children, family), coping skills, and hope for the future. Considering these factors, the overall suicide risk at this point appears to be low. Patient is appropriate for outpatient follow up.   Collaboration of Care: Collaboration of Care: Other reviewed notes in Epic  Patient/Guardian was advised Release of Information must be obtained prior to any record release in order to collaborate their care with an outside provider. Patient/Guardian was advised if they have not already done so to contact the registration department to sign all necessary forms in order for us  to release information regarding their care.   Consent: Patient/Guardian gives verbal consent for treatment and assignment of benefits for services provided during this visit. Patient/Guardian expressed understanding and agreed to proceed.    Todd Fossa, MD 02/22/2024, 12:42 PM

## 2024-02-18 ENCOUNTER — Ambulatory Visit: Admitting: Physical Therapy

## 2024-02-18 ENCOUNTER — Encounter: Payer: Self-pay | Admitting: Physical Therapy

## 2024-02-18 DIAGNOSIS — R2681 Unsteadiness on feet: Secondary | ICD-10-CM | POA: Diagnosis not present

## 2024-02-18 DIAGNOSIS — R2689 Other abnormalities of gait and mobility: Secondary | ICD-10-CM

## 2024-02-18 DIAGNOSIS — R29818 Other symptoms and signs involving the nervous system: Secondary | ICD-10-CM | POA: Diagnosis not present

## 2024-02-18 DIAGNOSIS — M6281 Muscle weakness (generalized): Secondary | ICD-10-CM

## 2024-02-18 DIAGNOSIS — R42 Dizziness and giddiness: Secondary | ICD-10-CM | POA: Diagnosis not present

## 2024-02-18 NOTE — Therapy (Signed)
 OUTPATIENT PHYSICAL THERAPY NEURO TREATMENT NOTE   Patient Name: Kristin [name] MRN: [id number] DOB:05-17-1951, 73 y.o., female Today's Date: 02/18/2024   PCP:    Almira Jaeger, MD   REFERRING PROVIDER: Shirline Dover, DO      END OF SESSION:  PT End of Session - 02/18/24 1455     Visit Number 14    Number of Visits 17    Date for PT Re-Evaluation 03/21/24    Authorization Type Medicare/[employer]    PT Start Time 1452    PT Stop Time 1531    PT Time Calculation (min) 39 min    Activity Tolerance Patient tolerated treatment well    Behavior During Therapy [facility] for tasks assessed/performed                       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   Hyperlipidemia    Hypertension    Infertility, female    Low back pain 10/17/2007   Qualifier: Diagnosis of  By: Larrie Po MD, Wilmon Hashimoto    Measles as a child   Obesity    Osteoarthritis    Parkinson disease (HCC)    Plantar fasciitis of left foot 06/19/2012   Preventative health care 12/19/2015   Rheumatoid arthritis (HCC) 01/08/2018   Rosacea 10/05/2014   Swallowing difficulty    Urinary frequency 02/25/2013   Visual floaters 05/11/2014   Past Surgical History:  Procedure Laterality Date   ABDOMINAL HYSTERECTOMY  2006   total   esophageal     stretching   HERNIA REPAIR N/A 2022   hiatal hernia   laporoscopy     LEFT HEART CATH AND CORONARY  ANGIOGRAPHY N/A 06/02/2019   Procedure: LEFT HEART CATH AND CORONARY ANGIOGRAPHY;  Surgeon: Lucendia Rusk, MD;  Location: [facility];  Service: Cardiovascular;  Laterality: N/A;   TONSILLECTOMY     TOTAL HIP ARTHROPLASTY Right 2010   UPPER GASTROINTESTINAL ENDOSCOPY     wisdom teeth extracted     Patient Active Problem List   Diagnosis Date Noted   Vitamin B 12 deficiency 05/30/2021   Anemia 03/16/2020   Coronary artery disease involving native coronary artery of native heart without angina pectoris 06/12/2019   Diastolic dysfunction 05/16/2019   Rheumatoid arthritis (HCC) 01/08/2018   Hand pain, right 10/15/2017   Vitamin D  deficiency 12/20/2016   Prediabetes 12/20/2016   Shortness of breath on exertion 11/27/2016   Morbid obesity (HCC)    Constipation 12/07/2015   BCC (basal cell carcinoma of skin) 10/05/2014   Rosacea 10/05/2014   Parkinson's disease (HCC) 05/11/2014   Screening for cervical cancer 07/17/2012   Foot pain, bilateral 06/19/2012   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   Depression with anxiety 11/02/2009   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   Lumbar back  pain with radiculopathy affecting lower extremity 10/17/2007   Essential hypertension 07/05/2007   Osteoarthritis 07/05/2007   Hyperlipidemia 06/26/2007   H/O: iron  deficiency anemia 05/08/2007   Hiatal hernia with GERD 05/08/2007    ONSET DATE: 11/29/2023 (MD referral)  REFERRING DIAG: G20.A1 (ICD-10-CM) - Parkinson's disease without dyskinesia or fluctuating manifestations (HCC)   THERAPY DIAG:  Muscle weakness (generalized)  Unsteadiness on feet  Other abnormalities of gait and mobility  Rationale for Evaluation and Treatment: Rehabilitation  SUBJECTIVE:                                                                                                                                                                                             SUBJECTIVE STATEMENT: Brought in the weights  from home Pt accompanied by: self  PERTINENT HISTORY: Parkinson's disease, gastroparesis, anxiety, hx of chest pain, knee OA, RA  PAIN:  Are you having pain? No  PRECAUTIONS: Fall  RED FLAGS: None   WEIGHT BEARING RESTRICTIONS: No  FALLS: Has patient fallen in last 6 months? No  LIVING ENVIRONMENT: Lives with: lives with their spouse Lives in: House/apartment Stairs: stairs in home with rails Has following equipment at home: Otho Blitz - 4 wheeled and bilateral walking poles  PLOF: Independent, Independent with household mobility with device, and Independent with community mobility with device  PATIENT GOALS: To try to combat the slowness and work on the dizziness.  OBJECTIVE:    TODAY'S TREATMENT: 02/18/2024 Activity Comments  Standing hip abduction, 2 x 10 Hip extension, 2 x 10 Knee flexion 2 x 10 Marching in place x 10 2#  Seated LAQ 2 x 10 Seated march 10 2#  Rearranged HEP Additions/modified into groups of strength, balance, walking  Forward/back gait 20 ft x 5  Slowed pace backwards            HOME EXERCISE PROGRAM:  Access Code: [email or web address] URL: [email or web address] Date: 02/18/2024 Prepared by: [facility] - Outpatient  Rehab - [facility]  Program Notes Pick 1-2 paths in your house that you would typically walk.  Count steps in your typical walking pattern.  THEN:  Walk this path with FEWER STEPS is you your goal.*ON YOUR CUSHION:  1) Stand with feet apart and turn your head side to side/up and down with eyes closed.  2)  Face the counter-side step off and on  Exercises - Side Stepping with Counter Support  - 1-2 x daily - 7 x weekly - 1 sets - 2-3 min hold - Backward Walking with Counter Support  - 1-2 x daily - 7 x weekly - 1 sets - 2-3 min hold -  Alternating Step Taps with Counter Support  - 1 x daily - 7 x weekly - 2 sets - 10 reps - Corner Balance Feet Apart: Eyes Closed With Head Turns  - 1 x daily - 7 x weekly - 3 sets - 5 reps -  Stride Stance Weight Shift  - 1 x daily - 7 x weekly - 2 sets - 10 reps - Alternating Heel Raises  - 1 x daily - 7 x weekly - 2 sets - 10 reps - Seated Gaze Stabilization with Head Rotation  - 1 x daily - 5 x weekly - 2-3 sets - 20-30 sec  hold - Feet Together Balance at [employer] Eyes Closed  - 1 x daily - 7 x weekly - 3 sets - 5 reps - Standing Gastroc Stretch at Counter  - 1-2 x daily - 7 x weekly - 1 sets - 3 reps - 30 sec hold - Seated Long Arc Quad with Ankle Weight  - 1 x daily - 3 x weekly - 2 sets - 10 reps - Seated Hip Flexion March with Ankle Weights  - 1 x daily - 3 x weekly - 2 sets - 10 reps - Standing Hip Extension with Ankle Weight  - 1 x daily - 3 x weekly - 2 sets - 10 reps - Standing Hip Abduction with Ankle Weight  - 1 x daily - 3 x weekly - 2 sets - 10 reps - Standing Knee Flexion with Ankle Weight  - 1 x daily - 3 x weekly - 2 sets - 10 reps       PATIENT EDUCATION: Education details: Updated/modifications to HEP, fall prevention on upcoming vacation Person educated: Patient Education method: Explanation, Demonstration, and Handouts Education comprehension: verbalized understanding, returned demonstration, and needs further education      Ways to offset your blood pressure fluctuations (lowering):  1)  Talk to Dr. Arlene Ben  2)  Stay well hydrated through your day with plenty of water  3)  If you sit for long periods-do several exercises for your legs before you stand up  -ankle pumps  -seated march  -seated leg kicks  4)  When you transition between lying down to sitting up or sitting to standing, take a moment to get your bearings to get set before you begin to move  5)  ??Consider asking MD about abdominal binder or compression socks ??-Probably not needed, given BP measures 01/15/2024  ---------------------------------------------------- Note: Objective measures were completed at Evaluation unless otherwise noted.  DIAGNOSTIC FINDINGS: NA for this  episode  COGNITION: Overall cognitive status: Within functional limits for tasks assessed   SENSATION: Light touch: WFL  EDEMA: Pt reports some BLE swelling and being monitored by MD   POSTURE: rounded shoulders and forward head  VITALS:   142/90, HR 71 bpm Seated   144/79  HR 74 bpm  Standing    LOWER EXTREMITY ROM:   AROM WFL   LOWER EXTREMITY MMT:  Grossly tested at least 4/5 BLEs   TRANSFERS: Sit to stand: Modified independence  Assistive device utilized: None     Stand to sit: Modified independence  Assistive device utilized: None     *Reports she needs to use hands most times to push up to stand from chairs at home*  GAIT:   FUNCTIONAL TESTS:  5 times sit to stand: 15.25 sec Timed up and go (TUG): 19.35 sed with walking pole (compared to 14 sec last bout of therapy)* 10 meter walk test:  17.98 sec (1.82 ft/sec) *compared to 2.52 ft/sec last bout of therapy* Mini-BESTest: 13/24 (decreased from 24/28 from last bout of therapy) TUG cognitive: 20.07 sec (compared to 15 sec last bout of therapy)* 3 M backwards walk:  11.94 sec                                                                                                                              TREATMENT DATE: 12/20/2023    PATIENT EDUCATION: Education details: Initial eval results, POC; encouraged general activity and walking with 4WW; preparation for neurologist visit to discuss symptoms/dizziness/slowed mobility  Person educated: Patient Education method: Explanation Education comprehension: verbalized understanding   GOALS: Goals reviewed with patient? Yes  SHORT TERM GOALS: Target date: 01/18/2024  Pt will be independent with HEP for improved balance, strength, gait. Baseline: Goal status: MET, 01/15/2024  2.  Pt will improve 5x sit<>stand to less than or equal to 12.5 sec to demonstrate improved functional strength and transfer efficiency. Baseline: 15.25 sec; 13.08 sec 01/17/24 Goal status: IN  PROGRESS 01/17/24  3.  Pt will improve TUG score to less than or equal to 15 sec for decreased fall risk. Baseline: 19 sec; 16.19 sec 01/17/24>13.34 sec Goal status: MET, 02/05/2024  LONG TERM GOALS: Target date: 02/15/2024  Pt will be independent with HEP for improved balance, strength, gait. Baseline:  Goal status: MET, 02/14/2024  2.  Pt will improve MiniBESTest score to at least 20/28 to decrease fall risk. Baseline: 13/28>23/28 Goal status: MET, 02/14/2024  3.  Pt will improve gait velocity to at least 2.3 ft/sec for improved gait efficiency and safety.  Baseline: 1.82 ft/sec>2.52 ft/sec Goal status: MET, 02/05/2024  4.  Pt will verbalize plans for continued community fitness to maximize gains made in PT. Baseline:  Goal status: IN PROGRESS, 02/14/2024  5.  Pt will improve 3 MWT backwards to less than or equal to 5 sec, for decreased fall risk.  Baseline:  6.91 sec  Goal status:  INITIAL, 02/14/2024  6.  Pt will improve SLS to at least 5 sec for improved obstacle and step negotiation.  Baseline:  3-4 sec  Goal status:  INITIAL, 02/14/2024   ASSESSMENT:  CLINICAL IMPRESSION: Pt presents today and brought in her ankle weights.  Worked on hip and quad/hamstring strengthening starting with 2# weights.  She performs 2 sets x 10 with cues for technique, and fatigues by end of sets.  Added to HEP for lower extremity stregthening.  She is going on vacation to the beach, so focused some time educating pt on fall prevention in more unfamiliar surroundings.  She has no c/o at end of session today.   Pt will continue to benefit from skilled PT towards goals for improved functional mobility and decreased fall risk.   OBJECTIVE IMPAIRMENTS: Abnormal gait, decreased balance, decreased mobility, difficulty walking, decreased strength, and dizziness.   ACTIVITY LIMITATIONS: standing, transfers, and locomotion level  PARTICIPATION LIMITATIONS: meal prep, cleaning, laundry,  driving, shopping, and  community activity  PERSONAL FACTORS: 3+ comorbidities: see above PMH; increasing dizziness/unsteadiness in past 1-2 months are also affecting patient's functional outcome.   REHAB POTENTIAL: Good  CLINICAL DECISION MAKING: Evolving/moderate complexity  EVALUATION COMPLEXITY: Moderate  PLAN:  PT FREQUENCY: 2x/week  PT DURATION: 8 weeks plus eval visit  PLANNED INTERVENTIONS: 97750- Physical Performance Testing, 97110-Therapeutic exercises, 97530- Therapeutic activity, [id number]- Neuromuscular re-education, 97535- Self Care, [id number]- Manual therapy, [phone]- Gait training, Patient/Family education, Balance training, and Vestibular training  PLAN FOR NEXT SESSION:    Review HEP and discuss overall HEP plan/PD fitness recommendations for weekly fitness.  Review strengthening portion of HEP and discuss progression to next weight level.  Gait with weighted carry   Dessie Flow, PT 02/18/24 3:35 PM Phone: [phone] Fax: [phone]  [facility] Re[facility] at [facility] [address] [place], [address] [place], [place] [address] Phone # [phone] Fax # [phone]

## 2024-02-19 ENCOUNTER — Encounter: Payer: Self-pay | Admitting: Physician Assistant

## 2024-02-19 ENCOUNTER — Ambulatory Visit (INDEPENDENT_AMBULATORY_CARE_PROVIDER_SITE_OTHER): Admitting: Physician Assistant

## 2024-02-19 VITALS — BP 130/82 | HR 73 | Ht 64.0 in | Wt 234.0 lb

## 2024-02-19 DIAGNOSIS — K581 Irritable bowel syndrome with constipation: Secondary | ICD-10-CM

## 2024-02-19 DIAGNOSIS — M069 Rheumatoid arthritis, unspecified: Secondary | ICD-10-CM | POA: Diagnosis not present

## 2024-02-19 DIAGNOSIS — K3184 Gastroparesis: Secondary | ICD-10-CM | POA: Diagnosis not present

## 2024-02-19 DIAGNOSIS — G20A1 Parkinson's disease without dyskinesia, without mention of fluctuations: Secondary | ICD-10-CM | POA: Diagnosis not present

## 2024-02-19 DIAGNOSIS — K5900 Constipation, unspecified: Secondary | ICD-10-CM

## 2024-02-19 NOTE — Patient Instructions (Signed)
 Follow up as needed.  Thank you for trusting me with your gastrointestinal care!  Reginal Capra, PA-C  _______________________________________________________  If your blood pressure at your visit was 140/90 or greater, please contact your primary care physician to follow up on this.  _______________________________________________________  If you are age 74 or older, your body mass index should be between 23-30. Your Body mass index is 40.17 kg/m. If this is out of the aforementioned range listed, please consider follow up with your Primary Care Provider.  If you are age 73 or younger, your body mass index should be between 19-25. Your Body mass index is 40.17 kg/m. If this is out of the aformentioned range listed, please consider follow up with your Primary Care Provider.   ________________________________________________________  The [place] [facility] providers would like to encourage you to use MYCHART to communicate with providers for non-urgent requests or questions.  Due to long hold times on the telephone, sending your provider a message by [facility] may be a faster and more efficient way to get a response.  Please allow 48 business hours for a response.  Please remember that this is for non-urgent requests.  _______________________________________________________

## 2024-02-19 NOTE — Progress Notes (Signed)
 Chief Complaint: Follow-up IBS-C  HPI:    Kristin [name] is a 73 year old female with a history of Parkinson's, rheumatoid arthritis, large hiatal hernia status postrepair with mesh and gastropexy 2022, known to Dr. Leonia Raman, who presents to clinic today for follow-up of IBS-C.    05/30/2023 patient seen in clinic by Dr. Leonia Raman at that time symptoms were stable, almost daily bowel movement with use of daily Trulance  and occasionally MiraLAX .  At that time continued on Trulance  and MiraLAX .  Discussed gastroparesis and told to avoid high fiber and high-fiber raw vegetables.     01/03/2024 CBC, CMP, lipid panel, B12, hemoglobin A1c and vitamin D  all normal.    Today, patient presents to clinic and explains that she was doing well on her Trulance  and MiraLAX  mixture but still sometimes go 3 or 4 days in between bowel movements.  In early January her insurance decided not to pay for Trulance  anymore so her sister suggested she drink Smooth move tea.  She typically has a half a cup of this and a dose of MiraLAX  daily and she goes every day.  She feels well with no abdominal pain and daily bowel movements.    Does have some questions about gastroparesis.  She tries to watch her diet, in general she does not get nauseous or vomit, she sometimes feels full fast but is not losing any weight and has no abdominal pain.    Denies fever, chills, weight loss or blood in her stool.  GI Hx: NUCLEAR MEDICINE GASTRIC EMPTYING SCAN 10-30-22 Expected location of the stomach in the left upper quadrant. Ingested meal empties the stomach slowly and incompletely over the course of the study.   10% emptied at 1 hr ( normal >= 10%)   27% emptied at 2 hr ( normal >= 40%)   33% emptied at 3 hr ( normal >= 70%)   42% emptied at 4 hr ( normal >= 90%)   IMPRESSION: Scintigraphic findings compatible with delayed gastric emptying.   EGD 10-06-22 - Z-line regular, 38 cm from the incisors. - LA Grade B reflux esophagitis  with no bleeding.  - A large amount of food (residue) in the stomach. Procedure was aborted.  - No specimens collected.   EGD June 07, 2020 - Benign-appearing esophageal stenosis. Dilated. - Large hiatal hernia. - Erosive gastropathy with no stigmata of recent bleeding associated with the hiatal hernia, c/w Cameron erosions. - Multiple gastric polyps. - Normal duodenal bulb and second portion of the duodenum. Biopsied.   Colonoscopy June 07, 2020 -Mild diverticulosis in the sigmoid colon. There was evidence of an impacted diverticulum. - Internal hemorrhoids. - The examination was otherwise normal on direct and retroflexion views.  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   Hyperlipidemia    Hypertension    Infertility, female  Low back pain 10/17/2007   Qualifier: Diagnosis of  By: Larrie Po MD, Wilmon Hashimoto    Measles as a child   Obesity    Osteoarthritis    Parkinson disease (HCC)    Plantar fasciitis of left foot 06/19/2012   Preventative health care 12/19/2015   Rheumatoid arthritis (HCC) 01/08/2018   Rosacea 10/05/2014   Swallowing difficulty    Urinary frequency 02/25/2013   Visual floaters 05/11/2014    Past Surgical History:  Procedure Laterality Date   ABDOMINAL HYSTERECTOMY  2006   total   esophageal     stretching   HERNIA REPAIR N/A 2022   hiatal hernia   laporoscopy     LEFT HEART CATH AND CORONARY  ANGIOGRAPHY N/A 06/02/2019   Procedure: LEFT HEART CATH AND CORONARY ANGIOGRAPHY;  Surgeon: Lucendia Rusk, MD;  Location: [facility] INVASIVE CV LAB;  Service: Cardiovascular;  Laterality: N/A;   TONSILLECTOMY     TOTAL HIP ARTHROPLASTY Right 2010   UPPER GASTROINTESTINAL ENDOSCOPY     wisdom teeth extracted      Current Outpatient Medications  Medication Sig Dispense Refill   carbidopa -levodopa  (SINEMET  CR) 50-200 MG tablet TAKE 1 TABLET BY MOUTH EVERYDAY AT BEDTIME 90 tablet 0   carbidopa -levodopa  (SINEMET  IR) 25-100 MG tablet TAKE 2 TABLETS BY MOUTH AT 7AM, 2 TABLETS AT 11AM, AND 1 TABLET AT 4PM 450 tablet 0   carvedilol  (COREG ) 3.125 MG tablet TAKE 1 TABLET BY MOUTH TWICE A DAY WITH FOOD 180 tablet 3   DULoxetine  (CYMBALTA ) 20 MG capsule Take 1 capsule (20 mg total) by mouth daily. 90 capsule 0   entacapone  (COMTAN ) 200 MG tablet Take 1 tablet (200 mg total) by mouth 3 (three) times daily. 270 tablet 1   escitalopram  (LEXAPRO ) 20 MG tablet Take 1 tablet (20 mg total) by mouth daily. 90 tablet 3   losartan  (COZAAR ) 50 MG tablet TAKE 1 TABLET BY MOUTH EVERY DAY (Patient taking differently: Take 25 mg by mouth daily.) 90 tablet 3   omeprazole  (PRILOSEC) 20 MG capsule TAKE 1 CAPSULE BY MOUTH 2 TIMES DAILY AS NEEDED. 180 capsule 1   Plecanatide  (TRULANCE ) 3 MG TABS Take 1 tablet (3 mg total) by mouth daily. 30 tablet 3   polyethylene glycol (MIRALAX  / GLYCOLAX ) 17 g packet Take 17 g by mouth daily as needed for mild constipation.     pramipexole  (MIRAPEX ) 0.5 MG tablet TAKE 1 TABLET BY MOUTH THREE TIMES A DAY 270 tablet 0   rosuvastatin  (CRESTOR ) 10 MG tablet TAKE 1 TABLET BY MOUTH ON MONDAYS,WEDNESDAYS, AND FRIDAYS 39 tablet 3   Vitamin D , Ergocalciferol , (DRISDOL ) 1.25 MG (50000 UNIT) CAPS capsule TAKE 1 CAPSULE (50,000 UNITS TOTAL) BY MOUTH EVERY 7 (SEVEN) DAYS 12 capsule 1   No current facility-administered medications for this visit.    Allergies as of 02/19/2024 - Review Complete  02/18/2024  Allergen Reaction Noted   Scopolamine Other (See Comments) 12/14/2020   Prednisone  Dermatitis 12/04/2011   Bactrim [sulfamethoxazole-trimethoprim] Other (See Comments) 08/27/2012   Penicillins Hives and Other (See Comments) 08/14/1971   Sulfa antibiotics  08/04/2012    Family History  Problem Relation Age of Onset   Other Mother        arrythmia   Mental illness Mother        bipolar   Hyperlipidemia Mother    Thyroid  disease Mother    Depression Mother    Bipolar disorder Mother    Breast cancer Mother 36   Heart disease Father  Arthritis Father        rheumatoid   Hypertension Father    Hyperlipidemia Father    Depression Sister    Mental illness Sister        bipolar   Parkinson's disease Sister    Arthritis Sister    Arthritis Sister    Arthritis Sister    Breast cancer Maternal Aunt 35   Breast cancer Maternal Uncle        57s   Heart attack Paternal Uncle    Heart attack Paternal Grandfather    Breast cancer Cousin 34   Colon cancer Neg Hx    Esophageal cancer Neg Hx    Rectal cancer Neg Hx    Stomach cancer Neg Hx    Sleep apnea Neg Hx     Social History   Socioeconomic History   Marital status: Married    Spouse name: Not on file   Number of children: 1   Years of education: Not on file   Highest education level: Master's degree (e.g., MA, MS, MEng, MEd, MSW, MBA)  Occupational History   Occupation: Retired International aid/development worker  Tobacco Use   Smoking status: Never   Smokeless tobacco: Never  Vaping Use   Vaping status: Never Used  Substance and Sexual Activity   Alcohol use: Yes    Alcohol/week: 1.0 standard drink of alcohol    Types: 1 Glasses of wine per week    Comment: occasional wine   Drug use: No   Sexual activity: Not Currently    Partners: Male  Other Topics Concern   Not on file  Social History Narrative   Lives with husband, 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 3 grandsons- 2 in [place] and 58 year old in [place] in 2023      Hobbies: book club, time with friends, time with couples friends, plays pianos   Social Drivers of Corporate investment banker Strain: Low Risk  (11/06/2023)   Overall Financial Resource Strain (CARDIA)    Difficulty of Paying Living Expenses: Not hard at all  Food Insecurity: No Food Insecurity (11/06/2023)   Hunger Vital Sign    Worried About Running Out of Food in the Last Year: Never true    Ran Out of Food in the Last Year: Never true  Transportation Needs: No Transportation Needs (11/06/2023)   PRAPARE - Administrator, Civil Service (Medical): No    Lack of Transportation (Non-Medical): No  Physical Activity: Sufficiently Active (11/06/2023)   Exercise Vital Sign    Days of Exercise per Week: 5 days    Minutes of Exercise per Session: 40 min  Stress: No Stress Concern Present (11/06/2023)   [employer] of Occupational Health - Occupational Stress Questionnaire    Feeling of Stress : Only a little  Social Connections: Unknown (11/06/2023)   Social Connection and Isolation Panel    Frequency of Communication with Friends and Family: More than three times a week    Frequency of Social Gatherings with Friends and Family: Three times a week    Attends Religious Services: Patient declined    Active Member of Clubs or Organizations: Yes    Attends Banker Meetings: 1 to 4 times per year    Marital Status: Married  Catering manager Violence: Not At Risk (03/06/2023)   Humiliation, Afraid, Rape, and Kick questionnaire  Fear of Current or Ex-Partner: No    Emotionally Abused: No    Physically Abused: No    Sexually Abused: No    Review of Systems:    Constitutional: No weight loss, fever or chills Cardiovascular: No chest pain Respiratory: No SOB  Gastrointestinal: See HPI and otherwise negative   Physical Exam:  Vital signs: BP 130/82   Pulse 73    Ht 5' 4 (1.626 m)   Wt 234 lb (106.1 kg)   BMI 40.17 kg/m    Constitutional:   Pleasant overweight Caucasian female appears to be in NAD, Well developed, Well nourished, alert and cooperative Respiratory: Respirations even and unlabored. Lungs clear to auscultation bilaterally.   No wheezes, crackles, or rhonchi.  Cardiovascular: Normal S1, S2. No MRG. Regular rate and rhythm. No peripheral edema, cyanosis or pallor.  Gastrointestinal:  Soft, nondistended, nontender. No rebound or guarding. Decreased BS all four quadrants. No appreciable masses or hepatomegaly. Rectal:  Not performed.  Psychiatric: Oriented to person, place and time. Demonstrates good judgement and reason without abnormal affect or behaviors.  RELEVANT LABS AND IMAGING: CBC    Component Value Date/Time   WBC 6.6 01/03/2024 1125   RBC 4.26 01/03/2024 1125   HGB 12.8 01/03/2024 1125   HGB 11.4 05/29/2019 1222   HCT 38.3 01/03/2024 1125   HCT 34.7 05/29/2019 1222   PLT 211.0 01/03/2024 1125   PLT 259 05/29/2019 1222   MCV 89.9 01/03/2024 1125   MCV 84 05/29/2019 1222   MCH 30.3 10/10/2023 1044   MCHC 33.3 01/03/2024 1125   RDW 14.2 01/03/2024 1125   RDW 16.2 (H) 05/29/2019 1222   LYMPHSABS 1.5 01/03/2024 1125   LYMPHSABS 1.8 11/27/2016 1123   MONOABS 0.6 01/03/2024 1125   EOSABS 0.1 01/03/2024 1125   EOSABS 0.1 11/27/2016 1123   BASOSABS 0.0 01/03/2024 1125   BASOSABS 0.0 11/27/2016 1123    CMP     Component Value Date/Time   NA 140 01/03/2024 1125   NA 144 12/05/2019 0935   K 3.8 01/03/2024 1125   CL 105 01/03/2024 1125   CO2 27 01/03/2024 1125   GLUCOSE 99 01/03/2024 1125   BUN 16 01/03/2024 1125   BUN 13 12/05/2019 0935   CREATININE 0.66 01/03/2024 1125   CREATININE 0.71 01/04/2015 1046   CALCIUM  9.7 01/03/2024 1125   PROT 6.9 01/03/2024 1125   PROT 6.9 11/27/2016 1123   ALBUMIN 4.2 01/03/2024 1125   ALBUMIN 4.3 11/27/2016 1123   AST 24 01/03/2024 1125   ALT 12 01/03/2024 1125   ALKPHOS  94 01/03/2024 1125   BILITOT 0.7 01/03/2024 1125   BILITOT 0.4 11/27/2016 1123   GFRNONAA >60 10/10/2023 1044   GFRNONAA >89 01/04/2015 1046   GFRAA >60 03/31/2020 2216   GFRAA >89 01/04/2015 1046    Assessment: 1.  IBS-C: Trulance  no longer paid for by her insurance so she is switched to smooth move tea daily, doing well 2.  Gastroparesis: Doing well watching her diet, low fiber foods 3.  Parkinson's disease 4.  Rheumatoid arthritis  Plan: 1.  Continue Smooth move tea daily as well as MiraLAX  as this works well for her. 2.  Discussed gastroparesis and diet recommendations.  She is doing well though with no symptoms.  We discussed this in detail today. 3.  Patient to follow in clinic with us  as needed.  Reginal Capra, PA-C [facility] 02/19/2024, 8:19 AM  Cc: Almira Jaeger, MD

## 2024-02-20 ENCOUNTER — Telehealth: Admitting: Psychiatry

## 2024-02-20 ENCOUNTER — Other Ambulatory Visit: Payer: Self-pay | Admitting: Psychiatry

## 2024-02-20 ENCOUNTER — Ambulatory Visit: Admitting: Physician Assistant

## 2024-02-20 NOTE — Telephone Encounter (Signed)
 pt left message that she needs refills on the duloxetine . pt was last seen on 4-23 next appt 6-20

## 2024-02-21 NOTE — Telephone Encounter (Signed)
Pt notified that rx was sent to the pharmacy. 

## 2024-02-22 ENCOUNTER — Telehealth: Admitting: Psychiatry

## 2024-02-22 ENCOUNTER — Encounter: Payer: Self-pay | Admitting: Psychiatry

## 2024-02-22 DIAGNOSIS — F419 Anxiety disorder, unspecified: Secondary | ICD-10-CM | POA: Diagnosis not present

## 2024-02-22 DIAGNOSIS — F3341 Major depressive disorder, recurrent, in partial remission: Secondary | ICD-10-CM | POA: Diagnosis not present

## 2024-02-22 NOTE — Patient Instructions (Signed)
 Continue duloxetine  20 mg daily  Continue lexapro  20 mg daily  Obtain labs- ferritin at [facility] Next appointment: 8/8 at 10 30

## 2024-03-03 ENCOUNTER — Encounter: Payer: Self-pay | Admitting: Physical Therapy

## 2024-03-03 ENCOUNTER — Ambulatory Visit: Admitting: Physical Therapy

## 2024-03-03 DIAGNOSIS — M6281 Muscle weakness (generalized): Secondary | ICD-10-CM | POA: Diagnosis not present

## 2024-03-03 DIAGNOSIS — R2689 Other abnormalities of gait and mobility: Secondary | ICD-10-CM | POA: Diagnosis not present

## 2024-03-03 DIAGNOSIS — R29818 Other symptoms and signs involving the nervous system: Secondary | ICD-10-CM | POA: Diagnosis not present

## 2024-03-03 DIAGNOSIS — R42 Dizziness and giddiness: Secondary | ICD-10-CM | POA: Diagnosis not present

## 2024-03-03 DIAGNOSIS — R2681 Unsteadiness on feet: Secondary | ICD-10-CM

## 2024-03-03 NOTE — Therapy (Signed)
 OUTPATIENT PHYSICAL THERAPY NEURO TREATMENT NOTE   Patient Name: Kristin [name] MRN: [id number] DOB:[birth year], 73 y.o., female Today's Date: 03/03/2024   PCP:    Katrinka Garnette KIDD, MD   REFERRING PROVIDER: Evonnie Asberry RAMAN, DO      END OF SESSION:  PT End of Session - 03/03/24 1620     Visit Number 15    Number of Visits 17    Date for PT Re-Evaluation 03/21/24    Authorization Type Medicare/[employer]et[employer]    PT Start Time 1620    PT Stop Time 1658    PT Time Calculation (min) 38 min    Activity Tolerance Patient tolerated treatment well    Behavior During Therapy [facility] for tasks assessed/performed                        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   Hyperlipidemia    Hypertension    Infertility, female    Low back pain 10/17/2007   Qualifier: Diagnosis of  By: Mavis MD, Norleen BRAVO    Measles as a child   Obesity    Osteoarthritis    Parkinson disease (HCC)    Plantar fasciitis of left foot 06/19/2012   Preventative health care 12/19/2015   Rheumatoid arthritis (HCC) 01/08/2018   Rosacea 10/05/2014   Swallowing difficulty    Urinary frequency 02/25/2013   Visual floaters 05/11/2014   Past Surgical History:  Procedure Laterality Date   ABDOMINAL HYSTERECTOMY  2006   total   esophageal     stretching   HERNIA REPAIR N/A 2022   hiatal hernia   laporoscopy     LEFT HEART CATH AND CORONARY  ANGIOGRAPHY N/A 06/02/2019   Procedure: LEFT HEART CATH AND CORONARY ANGIOGRAPHY;  Surgeon: Dann Candyce RAMAN, MD;  Location: [facility];  Service: Cardiovascular;  Laterality: N/A;   TONSILLECTOMY     TOTAL HIP ARTHROPLASTY Right 2010   UPPER GASTROINTESTINAL ENDOSCOPY     wisdom teeth extracted     Patient Active Problem List   Diagnosis Date Noted   Vitamin B 12 deficiency 05/30/2021   Anemia 03/16/2020   Coronary artery disease involving native coronary artery of native heart without angina pectoris 06/12/2019   Diastolic dysfunction 05/16/2019   Rheumatoid arthritis (HCC) 01/08/2018   Hand pain, right 10/15/2017   Vitamin D  deficiency 12/20/2016   Prediabetes 12/20/2016   Shortness of breath on exertion 11/27/2016   Morbid obesity (HCC)    Constipation 12/07/2015   BCC (basal cell carcinoma of skin) 10/05/2014   Rosacea 10/05/2014   Parkinson's disease (HCC) 05/11/2014   Screening for cervical cancer 07/17/2012   Foot pain, bilateral 06/19/2012   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   Depression with anxiety 11/02/2009   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   Lumbar  back pain with radiculopathy affecting lower extremity 10/17/2007   Essential hypertension 07/05/2007   Osteoarthritis 07/05/2007   Hyperlipidemia 06/26/2007   H/O: iron  deficiency anemia 05/08/2007   Hiatal hernia with GERD 05/08/2007    ONSET DATE: 11/29/2023 (MD referral)  REFERRING DIAG: G20.A1 (ICD-10-CM) - Parkinson's disease without dyskinesia or fluctuating manifestations (HCC)   THERAPY DIAG:  Unsteadiness on feet  Other abnormalities of gait and mobility  Rationale for Evaluation and Treatment: Rehabilitation  SUBJECTIVE:                                                                                                                                                                                             SUBJECTIVE STATEMENT: The beach was good-went down to the beach every day.   Made an updated exercise chart to follow.  Pt accompanied by: self  PERTINENT HISTORY: Parkinson's disease, gastroparesis, anxiety, hx of chest pain, knee OA, RA  PAIN:  Are you having pain? No  PRECAUTIONS: Fall  RED FLAGS: None   WEIGHT BEARING RESTRICTIONS: No  FALLS: Has patient fallen in last 6 months? No  LIVING ENVIRONMENT: Lives with: lives with their spouse Lives in: House/apartment Stairs: stairs in home with rails Has following equipment at home: Vannie - 4 wheeled and bilateral walking poles  PLOF: Independent, Independent with household mobility with device, and Independent with community mobility with device  PATIENT GOALS: To try to combat the slowness and work on the dizziness.  OBJECTIVE:     TODAY'S TREATMENT: 03/03/2024 Activity Comments  Standing hip abduction, 2 x 10 Hip extension, 2 x 10 Knee flexion 2 x 10 Marching in place x 10 2#  Seated LAQ 2 x 10 Seated march 10 2#  Gait x 50 ft, 2 reps, then 4 additional reps with weighted carry of 8# Cues for posture, abdominal activation Pt fatigues easily            Reviewed pt's HEP charts that organize her HEP-good return demo of above strengthening exercises and good form throughout Discussed weekly schedule for ex: Mondays-[facility] 3:30 pm Tuesdays-will start dance in the fall Wednesdays-[facility] [employer] 10 am Thursdays- Fridays-[facility] at [facility], Chair Yoga   Provided ideas for other days to help challenge her current exercise program:  Tai Chi at [facility], aquatic exercise class at [facility] or [address], Dance class at [employer] (new and pt would need to ask about the details for this).    HOME EXERCISE PROGRAM:  Access Code: [email or web address] URL: [email or web address] Date: 02/18/2024 Prepared by: [facility] - Outpatient  Rehab - [facility]  Program Notes Pick 1-2 paths in your  house that you would typically walk.  Count steps in your typical walking pattern.  THEN:  Walk this  path with FEWER STEPS is you your goal.*ON YOUR CUSHION:  1) Stand with feet apart and turn your head side to side/up and down with eyes closed.  2)  Face the counter-side step off and on  Exercises - Side Stepping with Counter Support  - 1-2 x daily - 7 x weekly - 1 sets - 2-3 min hold - Backward Walking with Counter Support  - 1-2 x daily - 7 x weekly - 1 sets - 2-3 min hold - Alternating Step Taps with Counter Support  - 1 x daily - 7 x weekly - 2 sets - 10 reps - Corner Balance Feet Apart: Eyes Closed With Head Turns  - 1 x daily - 7 x weekly - 3 sets - 5 reps - Stride Stance Weight Shift  - 1 x daily - 7 x weekly - 2 sets - 10 reps - Alternating Heel Raises  - 1 x daily - 7 x weekly - 2 sets - 10 reps - Seated Gaze Stabilization with Head Rotation  - 1 x daily - 5 x weekly - 2-3 sets - 20-30 sec  hold - Feet Together Balance at [employer] Eyes Closed  - 1 x daily - 7 x weekly - 3 sets - 5 reps - Standing Gastroc Stretch at Counter  - 1-2 x daily - 7 x weekly - 1 sets - 3 reps - 30 sec hold - Seated Long Arc Quad with Ankle Weight  - 1 x daily - 3 x weekly - 2 sets - 10 reps - Seated Hip Flexion March with Ankle Weights  - 1 x daily - 3 x weekly - 2 sets - 10 reps - Standing Hip Extension with Ankle Weight  - 1 x daily - 3 x weekly - 2 sets - 10 reps - Standing Hip Abduction with Ankle Weight  - 1 x daily - 3 x weekly - 2 sets - 10 reps - Standing Knee Flexion with Ankle Weight  - 1 x daily - 3 x weekly - 2 sets - 10 reps       PATIENT EDUCATION: Education details: Community fitness options Person educated: Patient Education method: Programmer, multimedia, Facilities manager, and Handouts Education comprehension: verbalized understanding, returned demonstration, and needs further education      Ways to offset your blood pressure fluctuations (lowering):  1)  Talk to Dr. Katrinka  2)  Stay well hydrated through your day with plenty of water  3)  If you sit for long periods-do several  exercises for your legs before you stand up  -ankle pumps  -seated march  -seated leg kicks  4)  When you transition between lying down to sitting up or sitting to standing, take a moment to get your bearings to get set before you begin to move  5)  ??Consider asking MD about abdominal binder or compression socks ??-Probably not needed, given BP measures 01/15/2024  ---------------------------------------------------- Note: Objective measures were completed at Evaluation unless otherwise noted.  DIAGNOSTIC FINDINGS: NA for this episode  COGNITION: Overall cognitive status: Within functional limits for tasks assessed   SENSATION: Light touch: WFL  EDEMA: Pt reports some BLE swelling and being monitored by MD   POSTURE: rounded shoulders and forward head  VITALS:   142/90, HR 71 bpm Seated   144/79  HR 74 bpm  Standing    LOWER EXTREMITY ROM:  AROM WFL   LOWER EXTREMITY MMT:  Grossly tested at least 4/5 BLEs   TRANSFERS: Sit to stand: Modified independence  Assistive device utilized: None     Stand to sit: Modified independence  Assistive device utilized: None     *Reports she needs to use hands most times to push up to stand from chairs at home*  GAIT:   FUNCTIONAL TESTS:  5 times sit to stand: 15.25 sec Timed up and go (TUG): 19.35 sed with walking pole (compared to 14 sec last bout of therapy)* 10 meter walk test: 17.98 sec (1.82 ft/sec) *compared to 2.52 ft/sec last bout of therapy* Mini-BESTest: 13/24 (decreased from 24/28 from last bout of therapy) TUG cognitive: 20.07 sec (compared to 15 sec last bout of therapy)* 3 M backwards walk:  11.94 sec                                                                                                                              TREATMENT DATE: 12/20/2023    PATIENT EDUCATION: Education details: Initial eval results, POC; encouraged general activity and walking with 4WW; preparation for neurologist visit to discuss  symptoms/dizziness/slowed mobility  Person educated: Patient Education method: Explanation Education comprehension: verbalized understanding   GOALS: Goals reviewed with patient? Yes  SHORT TERM GOALS: Target date: 01/18/2024  Pt will be independent with HEP for improved balance, strength, gait. Baseline: Goal status: MET, 01/15/2024  2.  Pt will improve 5x sit<>stand to less than or equal to 12.5 sec to demonstrate improved functional strength and transfer efficiency. Baseline: 15.25 sec; 13.08 sec 01/17/24 Goal status: IN PROGRESS 01/17/24  3.  Pt will improve TUG score to less than or equal to 15 sec for decreased fall risk. Baseline: 19 sec; 16.19 sec 01/17/24>13.34 sec Goal status: MET, 02/05/2024  LONG TERM GOALS: Target date: 02/15/2024>03/21/2024   Pt will be independent with HEP for improved balance, strength, gait. Baseline:  Goal status: MET, 02/14/2024  2.  Pt will improve MiniBESTest score to at least 20/28 to decrease fall risk. Baseline: 13/28>23/28 Goal status: MET, 02/14/2024  3.  Pt will improve gait velocity to at least 2.3 ft/sec for improved gait efficiency and safety.  Baseline: 1.82 ft/sec>2.52 ft/sec Goal status: MET, 02/05/2024  4.  Pt will verbalize plans for continued community fitness to maximize gains made in PT. Baseline:  Goal status: IN PROGRESS, 02/14/2024  5.  Pt will improve 3 MWT backwards to less than or equal to 5 sec, for decreased fall risk.  Baseline:  6.91 sec  Goal status:  INITIAL, 02/14/2024  6.  Pt will improve SLS to at least 5 sec for improved obstacle and step negotiation.  Baseline:  3-4 sec  Goal status:  INITIAL, 02/14/2024   ASSESSMENT:  CLINICAL IMPRESSION: Pt presents today with no new complaints; she was able to go on vacation to the beach with family without issue.  Skilled PT session focused on review of standing  strengthening for BLEs and discussed community fitness options to supplement her current routine.  She is able  to return demo her exercises with good form.  With weighted carry with gait, she fatigues easily and needs reminders for posture/abdominal activation. Pt will continue to benefit from skilled PT towards goals for improved functional mobility and decreased fall risk.    OBJECTIVE IMPAIRMENTS: Abnormal gait, decreased balance, decreased mobility, difficulty walking, decreased strength, and dizziness.   ACTIVITY LIMITATIONS: standing, transfers, and locomotion level  PARTICIPATION LIMITATIONS: meal prep, cleaning, laundry, driving, shopping, and community activity  PERSONAL FACTORS: 3+ comorbidities: see above PMH; increasing dizziness/unsteadiness in past 1-2 months are also affecting patient's functional outcome.   REHAB POTENTIAL: Good  CLINICAL DECISION MAKING: Evolving/moderate complexity  EVALUATION COMPLEXITY: Moderate  PLAN:  PT FREQUENCY: 2x/week  PT DURATION: 8 weeks plus eval visit  PLANNED INTERVENTIONS: 97750- Physical Performance Testing, 97110-Therapeutic exercises, 97530- Therapeutic activity, [id number]- Neuromuscular re-education, 97535- Self Care, [id number]- Manual therapy, [phone]- Gait training, Patient/Family education, Balance training, and Vestibular training  PLAN FOR NEXT SESSION:    Ask about what she has looked into about PD for weekly fitness.  Review strengthening portion of HEP and discuss progression to next weight level.  Gait with weighted carry   Greig Anon, PT 03/03/24 5:02 PM Phone: [phone] Fax: [phone]  [facility] at [facility] [address] [place], [address] [place], [place] [address] Phone # [phone] Fax # [phone]

## 2024-03-10 ENCOUNTER — Ambulatory Visit: Admitting: Physical Therapy

## 2024-03-11 DIAGNOSIS — K3184 Gastroparesis: Secondary | ICD-10-CM | POA: Diagnosis not present

## 2024-03-11 DIAGNOSIS — Z6841 Body Mass Index (BMI) 40.0 and over, adult: Secondary | ICD-10-CM | POA: Diagnosis not present

## 2024-03-11 DIAGNOSIS — E65 Localized adiposity: Secondary | ICD-10-CM | POA: Diagnosis not present

## 2024-03-11 DIAGNOSIS — E66813 Obesity, class 3: Secondary | ICD-10-CM | POA: Diagnosis not present

## 2024-03-11 DIAGNOSIS — R7303 Prediabetes: Secondary | ICD-10-CM | POA: Diagnosis not present

## 2024-03-11 DIAGNOSIS — I10 Essential (primary) hypertension: Secondary | ICD-10-CM | POA: Diagnosis not present

## 2024-03-13 ENCOUNTER — Ambulatory Visit: Payer: Medicare Other

## 2024-03-13 VITALS — Ht 63.0 in | Wt 234.0 lb

## 2024-03-13 DIAGNOSIS — Z0000 Encounter for general adult medical examination without abnormal findings: Secondary | ICD-10-CM

## 2024-03-13 NOTE — Progress Notes (Signed)
 Subjective:   Kristin [name] is a 73 y.o. who presents for a Medicare Wellness preventive visit.  As a reminder, Annual Wellness Visits don't include a physical exam, and some assessments may be limited, especially if this visit is performed virtually. We may recommend an in-person follow-up visit with your provider if needed.  Visit Complete: Virtual I connected with  Kristin [name] on 03/13/24 by a audio enabled telemedicine application and verified that I am speaking with the correct person using two identifiers.  Patient Location: Home  Provider Location: Office/Clinic  I discussed the limitations of evaluation and management by telemedicine. The patient expressed understanding and agreed to proceed.  Vital Signs: Because this visit was a virtual/telehealth visit, some criteria may be missing or patient reported. Any vitals not documented were not able to be obtained and vitals that have been documented are patient reported.  VideoDeclined- This patient declined Librarian, academic. Therefore the visit was completed with audio only.  Persons Participating in Visit: Patient.  AWV Questionnaire: Yes: Patient Medicare AWV questionnaire was completed by the patient on 03/10/24; I have confirmed that all information answered by patient is correct and no changes since this date.  Cardiac Risk Factors include: advanced age (>15men, >1 women);obesity (BMI >30kg/m2);dyslipidemia;hypertension     Objective:    Today's Vitals   03/13/24 1043  Weight: 234 lb (106.1 kg)  Height: 5' 3 (1.6 m)   Body mass index is 41.45 kg/m.     03/13/2024   10:47 AM 12/20/2023    7:59 AM 09/20/2023    8:40 AM 03/22/2023   11:24 AM 03/20/2023    8:59 AM 03/06/2023   11:15 AM 03/06/2023    8:53 AM  Advanced Directives  Does Patient Have a Medical Advance Directive? Yes Yes Yes No Yes Yes Yes  Type of Estate agent of [place];Living will Healthcare Power of  [place];Living will Living will  Living will Healthcare Power of [place];Living will Healthcare Power of [place];Living will  Does patient want to make changes to medical advance directive? No - Patient declined No - Patient declined  No - Patient declined  No - Patient declined   Copy of Healthcare Power of Attorney in Chart? Yes - validated most recent copy scanned in chart (See row information)     Yes - validated most recent copy scanned in chart (See row information)   Would patient like information on creating a medical advance directive?    No - Patient declined       Current Medications (verified) Outpatient Encounter Medications as of 03/13/2024  Medication Sig   carbidopa -levodopa  (SINEMET  CR) 50-200 MG tablet TAKE 1 TABLET BY MOUTH EVERYDAY AT BEDTIME   carbidopa -levodopa  (SINEMET  IR) 25-100 MG tablet TAKE 2 TABLETS BY MOUTH AT 7AM, 2 TABLETS AT 11AM, AND 1 TABLET AT 4PM   carvedilol  (COREG ) 3.125 MG tablet TAKE 1 TABLET BY MOUTH TWICE A DAY WITH FOOD   DULoxetine  (CYMBALTA ) 20 MG capsule Take 1 capsule (20 mg total) by mouth daily.   entacapone  (COMTAN ) 200 MG tablet Take 1 tablet (200 mg total) by mouth 3 (three) times daily.   escitalopram  (LEXAPRO ) 20 MG tablet Take 1 tablet (20 mg total) by mouth daily.   omeprazole  (PRILOSEC) 20 MG capsule TAKE 1 CAPSULE BY MOUTH 2 TIMES DAILY AS NEEDED.   polyethylene glycol (MIRALAX  / GLYCOLAX ) 17 g packet Take 17 g by mouth daily as needed for mild constipation.   pramipexole  (MIRAPEX )  0.5 MG tablet TAKE 1 TABLET BY MOUTH THREE TIMES A DAY   rosuvastatin  (CRESTOR ) 10 MG tablet TAKE 1 TABLET BY MOUTH ON MONDAYS,WEDNESDAYS, AND FRIDAYS   Vitamin D , Ergocalciferol , (DRISDOL ) 1.25 MG (50000 UNIT) CAPS capsule TAKE 1 CAPSULE (50,000 UNITS TOTAL) BY MOUTH EVERY 7 (SEVEN) DAYS   [DISCONTINUED] losartan  (COZAAR ) 50 MG tablet TAKE 1 TABLET BY MOUTH EVERY DAY (Patient taking differently: Take 25 mg by mouth daily.)   [DISCONTINUED] Plecanatide   (TRULANCE ) 3 MG TABS Take 1 tablet (3 mg total) by mouth daily.   No facility-administered encounter medications on file as of 03/13/2024.    Allergies (verified) Scopolamine, Prednisone , Bactrim [sulfamethoxazole-trimethoprim], Penicillins, and Sulfa antibiotics   History: 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   Hyperlipidemia    Hypertension    Infertility, female    Low back pain 10/17/2007   Qualifier: Diagnosis of  By: Mavis MD, Norleen BRAVO    Measles as a child   Obesity    Osteoarthritis    Parkinson disease (HCC)    Plantar fasciitis of left foot 06/19/2012   Preventative health care 12/19/2015   Rheumatoid arthritis (HCC) 01/08/2018   Rosacea 10/05/2014   Swallowing difficulty    Urinary frequency 02/25/2013   Visual floaters 05/11/2014   Past Surgical History:  Procedure Laterality Date   ABDOMINAL HYSTERECTOMY  2006   total   esophageal     stretching   HERNIA REPAIR N/A 2022   hiatal hernia   laporoscopy     LEFT HEART CATH AND CORONARY ANGIOGRAPHY N/A 06/02/2019   Procedure: LEFT HEART CATH AND CORONARY ANGIOGRAPHY;  Surgeon: Dann Candyce RAMAN, MD;  Location: [facility];  Service: Cardiovascular;  Laterality: N/A;   TONSILLECTOMY     TOTAL HIP ARTHROPLASTY Right 2010   UPPER GASTROINTESTINAL ENDOSCOPY     wisdom teeth extracted     Family History  Problem Relation Age of Onset   Other  Mother        arrythmia   Mental illness Mother        bipolar   Hyperlipidemia Mother    Thyroid  disease Mother    Depression Mother    Bipolar disorder Mother    Breast cancer Mother 80   Heart disease Father    Arthritis Father        rheumatoid   Hypertension Father    Hyperlipidemia Father    Depression Sister    Mental illness Sister        bipolar   Parkinson's disease Sister    Arthritis Sister    Arthritis Sister    Arthritis Sister    Breast cancer Maternal Aunt 35   Breast cancer Maternal Uncle        15s   Heart attack Paternal Uncle    Heart attack  Paternal Grandfather    Breast cancer Cousin 26   Colon cancer Neg Hx    Esophageal cancer Neg Hx    Rectal cancer Neg Hx    Stomach cancer Neg Hx    Sleep apnea Neg Hx    Social History   Socioeconomic History   Marital status: Married    Spouse name: Not on file   Number of children: 1   Years of education: Not on file   Highest education level: Master's degree (e.g., MA, MS, MEng, MEd, MSW, MBA)  Occupational History   Occupation: Retired International aid/development worker  Tobacco Use   Smoking status: Never   Smokeless tobacco: Never  Vaping Use   Vaping status: Never Used  Substance and Sexual Activity   Alcohol use: Yes    Alcohol/week: 1.0 standard drink of alcohol    Types: 1 Glasses of wine per week    Comment: occasional wine   Drug use: No   Sexual activity: Not Currently    Partners: Male  Other Topics Concern   Not on file  Social History Narrative   Lives with husband, 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 3 grandsons- 2 in [place] and 69 year old in [place] in 2023      Hobbies: book club, time with friends, time with couples friends, plays pianos   Social Drivers of Corporate investment banker Strain: Low Risk  (03/10/2024)   Overall Financial Resource Strain (CARDIA)    Difficulty of Paying Living  Expenses: Not hard at all  Food Insecurity: No Food Insecurity (03/10/2024)   Hunger Vital Sign    Worried About Running Out of Food in the Last Year: Never true    Ran Out of Food in the Last Year: Never true  Transportation Needs: No Transportation Needs (03/10/2024)   PRAPARE - Administrator, Civil Service (Medical): No    Lack of Transportation (Non-Medical): No  Physical Activity: Sufficiently Active (03/10/2024)   Exercise Vital Sign    Days of Exercise per Week: 5 days    Minutes of Exercise per Session: 60 min  Stress: No Stress Concern Present (03/10/2024)   [employer] of Occupational Health - Occupational Stress Questionnaire    Feeling of Stress: Only a little  Social Connections: Socially Integrated (03/10/2024)   Social Connection and Isolation Panel    Frequency of Communication with Friends and Family: More than three times a week    Frequency of Social Gatherings with Friends and Family: Three times a week    Attends Religious Services: 1 to 4 times per year    Active Member of Clubs or Organizations: Yes    Attends Engineer, structural: More than 4 times per year    Marital Status: Married    Tobacco Counseling Counseling given: Not Answered    Clinical Intake:  Pre-visit preparation completed: Yes  Pain : No/denies pain     BMI - recorded: 41.45 Nutritional Status: BMI > 30  Obese Nutritional Risks: None Diabetes: No  Lab Results  Component Value Date   HGBA1C 6.2 01/03/2024   HGBA1C 6.0 03/28/2023   HGBA1C 6.1 09/27/2022     How often do you need to have someone help you when you read instructions, pamphlets, or other written materials from your doctor or pharmacy?: 1 - Never  Interpreter Needed?: No  Information entered by ::  Ellouise Haws, LPN   Activities of Daily Living     03/10/2024    9:51 AM  In your present state of health, do you have any difficulty performing the following activities:  Hearing? 0  Vision? 0   Difficulty concentrating or making decisions? 0  Walking or climbing stairs? 0  Dressing or bathing? 0  Doing errands, shopping? 0  Preparing Food and eating ? N  Using the Toilet? N  In the past six months, have you accidently leaked urine? N  Do you have problems with loss of bowel control? N  Managing your Medications? N  Managing your Finances? N  Housekeeping or managing your Housekeeping? N    Patient Care Team: Katrinka Garnette KIDD, MD as PCP - General (Family Medicine) Tobb, Kardie, DO as PCP - Cardiology (Cardiology) Tat, Asberry RAMAN, DO as Consulting Physician (Neurology) Tat, Asberry RAMAN, DO as Consulting Physician (Neurology)  I have updated your Care Teams any recent Medical Services you may have received from other providers in the past year.     Assessment:   This is a routine wellness examination for [facility].  Hearing/Vision screen Hearing Screening - Comments:: Pt denies any hearing issues  Vision Screening - Comments:: Wears rx glasses - up to date with routine eye exams with Dr Arlyss King    Goals Addressed             This Visit's Progress    Patient Stated       Lose weight        Depression Screen     03/13/2024   10:46 AM 11/06/2023   10:26 AM 11/06/2023   10:17 AM 05/09/2023    9:28 AM 03/28/2023    8:50 AM 03/06/2023   11:13 AM 01/23/2023   11:07 AM  PHQ 2/9 Scores  PHQ - 2 Score 0 0 0 0 4 0 0  PHQ- 9 Score 0 8  9 15  0 0    Fall Risk     03/10/2024    9:51 AM 11/06/2023   10:17 AM 09/20/2023    8:40 AM 05/09/2023    9:28 AM 03/28/2023    8:50 AM  Fall Risk   Falls in the past year? 0 0 0 0 0  Number falls in past yr: 0 0 0 0 0  Injury with Fall? 0  0 0 0  Risk for fall due to : Impaired balance/gait No Fall Risks  No Fall Risks   Follow up Falls prevention discussed Falls evaluation completed Falls evaluation completed Falls evaluation completed     MEDICARE RISK AT HOME:  Medicare Risk at Home Any stairs in or around the home?: (Patient-Rptd)  Yes If so, are there any without handrails?: (Patient-Rptd) No Home free of loose throw rugs in walkways, pet beds, electrical cords, etc?: (Patient-Rptd) Yes Adequate lighting in your home to reduce risk of falls?: (Patient-Rptd) Yes Life alert?: (Patient-Rptd) No Use of a cane, walker or w/c?: Yes Grab bars in the bathroom?: (Patient-Rptd) Yes Shower chair or bench in shower?: (Patient-Rptd) Yes Elevated toilet seat or a handicapped toilet?: (Patient-Rptd) Yes  TIMED UP AND GO:  Was the test performed?  No  Cognitive Function: 6CIT completed    10/15/2017   10:41 AM  MMSE - Mini Mental State Exam  Orientation to time 5   Orientation to Place 5   Registration 3   Attention/ Calculation 5   Recall 3   Language- name 2 objects 2  Language- repeat 1  Language- follow 3 step command 3   Language- read & follow direction 1   Write a sentence 1   Copy design 1   Total score 30      Data saved with a previous flowsheet row definition        03/13/2024   10:50 AM 03/06/2023   11:17 AM  6CIT Screen  What Year? 0 points 0 points  What month? 0 points 0 points  What time? 0 points 0 points  Count back from 20 0 points 0 points  Months in reverse 0 points 0 points  Repeat phrase 0 points 0 points  Total Score 0 points 0 points    Immunizations Immunization History  Administered Date(s) Administered   Fluad Quad(high Dose 65+) 05/30/2021   Fluad Trivalent(High Dose 65+) 05/09/2023   Influenza Split 06/19/2012   Influenza Whole 07/05/2007, 07/08/2009   Influenza, High Dose Seasonal PF 05/04/2016, 05/10/2017, 05/07/2018, 04/29/2019, 06/17/2020, 06/18/2022   Influenza,inj,Quad PF,6+ Mos 07/10/2013, 05/05/2014   Influenza-Unspecified 05/30/2023   Moderna Covid-19 Fall Seasonal Vaccine 88yrs & older 07/16/2023   PFIZER Comirnaty(Gray Top)Covid-19 Tri-Sucrose Vaccine 01/11/2021   PFIZER(Purple Top)SARS-COV-2 Vaccination 10/07/2019, 10/30/2019, 05/05/2020   Pfizer Covid-19  Vaccine Bivalent Booster 8yrs & up 06/15/2021   Pneumococcal Conjugate-13 07/10/2013   Pneumococcal Polysaccharide-23 12/07/2015   Td 09/04/2004   Tdap 01/04/2015   Zoster Recombinant(Shingrix) 12/20/2017, 03/11/2018    Screening Tests Health Maintenance  Topic Date Due   COVID-19 Vaccine (7 - Pfizer risk 2024-25 season) 01/13/2024   INFLUENZA VACCINE  04/04/2024   DTaP/Tdap/Td (3 - Td or Tdap) 01/03/2025   Medicare Annual Wellness (AWV)  03/13/2025   MAMMOGRAM  02/04/2026   Pneumococcal Vaccine: 50+ Years  Completed   DEXA SCAN  Completed   Hepatitis C Screening  Completed   Zoster Vaccines- Shingrix  Completed   Hepatitis B Vaccines  Aged Out   HPV VACCINES  Aged Out   Meningococcal B Vaccine  Aged Out   Colonoscopy  Discontinued    Health Maintenance  Health Maintenance Due  Topic Date Due   COVID-19 Vaccine (7 - Pfizer risk 2024-25 season) 01/13/2024   Health Maintenance Items Addressed: See Nurse Notes at the end of this note  Additional Screening:  Vision Screening: Recommended annual ophthalmology exams for early detection of glaucoma and other disorders of the eye. Would you like a referral to an eye doctor? No    Dental Screening: Recommended annual dental exams for proper oral hygiene  Community Resource Referral / Chronic Care Management: CRR required this visit?  No   CCM required this visit?  No   Plan:    I have personally reviewed and noted the following in the patient's chart:   Medical and social history Use of alcohol, tobacco or illicit drugs  Current medications and supplements including opioid prescriptions. Patient is not currently taking opioid prescriptions. Functional ability and status Nutritional status Physical activity Advanced directives List of other physicians Hospitalizations, surgeries, and ER visits in previous 12 months Vitals Screenings to include cognitive, depression, and falls Referrals and appointments  In  addition, I have reviewed and discussed with patient certain preventive protocols, quality metrics, and best practice recommendations. A written personalized care plan for preventive services as well as general preventive health recommendations were provided to patient.   Ellouise VEAR Haws, LPN   [id number]/[id number]   After Visit Summary: (MyChart) Due to this being a telephonic visit, the after visit summary with patients personalized  plan was offered to patient via MyChart   Notes: Nothing significant to report at this time.

## 2024-03-13 NOTE — Patient Instructions (Signed)
 Ms. Kristin [name] , Thank you for taking time out of your busy schedule to complete your Annual Wellness Visit with me. I enjoyed our conversation and look forward to speaking with you again next year. I, as well as your care team,  appreciate your ongoing commitment to your health goals. Please review the following plan we discussed and let me know if I can assist you in the future. Your Game plan/ To Do List    Referrals: If you haven't heard from the office you've been referred to, please reach out to them at the phone provided.   Follow up Visits: Next Medicare AWV with our clinical staff: 03/19/25   Have you seen your provider in the last 6 months (3 months if uncontrolled diabetes)? No Next Office Visit with your provider: 05/13/24  Clinician Recommendations:  Aim for 30 minutes of exercise or brisk walking, 6-8 glasses of water, and 5 servings of fruits and vegetables each day.       This is a list of the screening recommended for you and due dates:  Health Maintenance  Topic Date Due   COVID-19 Vaccine (7 - Pfizer risk 2024-25 season) 01/13/2024   Flu Shot  04/04/2024   DTaP/Tdap/Td vaccine (3 - Td or Tdap) 01/03/2025   Medicare Annual Wellness Visit  03/13/2025   Mammogram  02/04/2026   Pneumococcal Vaccine for age over 9  Completed   DEXA scan (bone density measurement)  Completed   Hepatitis C Screening  Completed   Zoster (Shingles) Vaccine  Completed   Hepatitis B Vaccine  Aged Out   HPV Vaccine  Aged Out   Meningitis B Vaccine  Aged Out   Colon Cancer Screening  Discontinued    Advanced directives: (In Chart) A copy of your advanced directives are scanned into your chart should your provider ever need it. Advance Care Planning is important because it:  [x]  Makes sure you receive the medical care that is consistent with your values, goals, and preferences  [x]  It provides guidance to your family and loved ones and reduces their decisional burden about whether or not they are  making the right decisions based on your wishes.  Follow the link provided in your after visit summary or read over the paperwork we have mailed to you to help you started getting your Advance Directives in place. If you need assistance in completing these, please reach out to us  so that we can help you!  See attachments for Preventive Care and Fall Prevention Tips.

## 2024-03-14 ENCOUNTER — Encounter: Payer: Self-pay | Admitting: Physical Therapy

## 2024-03-14 ENCOUNTER — Ambulatory Visit: Attending: Neurology | Admitting: Physical Therapy

## 2024-03-14 DIAGNOSIS — R2689 Other abnormalities of gait and mobility: Secondary | ICD-10-CM | POA: Insufficient documentation

## 2024-03-14 DIAGNOSIS — R2681 Unsteadiness on feet: Secondary | ICD-10-CM | POA: Insufficient documentation

## 2024-03-14 DIAGNOSIS — R29818 Other symptoms and signs involving the nervous system: Secondary | ICD-10-CM | POA: Diagnosis not present

## 2024-03-14 DIAGNOSIS — M6281 Muscle weakness (generalized): Secondary | ICD-10-CM | POA: Insufficient documentation

## 2024-03-14 NOTE — Therapy (Signed)
 OUTPATIENT PHYSICAL THERAPY NEURO TREATMENT NOTE   Patient Name: Kristin [name] MRN: [id number] DOB:07-17-1951, 73 y.o., female Today's Date: 03/14/2024   PCP:    Katrinka Garnette KIDD, MD   REFERRING PROVIDER: Evonnie Asberry RAMAN, DO      END OF SESSION:  PT End of Session - 03/14/24 0841     Visit Number 16    Number of Visits 17    Date for PT Re-Evaluation 03/21/24    Authorization Type Medicare/Aetna    PT Start Time 0845    PT Stop Time 0928    PT Time Calculation (min) 43 min    Activity Tolerance Patient tolerated treatment well    Behavior During Therapy [facility] for tasks assessed/performed                         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   Hyperlipidemia    Hypertension    Infertility, female    Low back pain 10/17/2007   Qualifier: Diagnosis of  By: Mavis MD, Norleen BRAVO    Measles as a child   Obesity    Osteoarthritis    Parkinson disease (HCC)    Plantar fasciitis of left foot 06/19/2012   Preventative health care 12/19/2015   Rheumatoid arthritis (HCC) 01/08/2018   Rosacea 10/05/2014   Swallowing difficulty    Urinary frequency 02/25/2013   Visual floaters 05/11/2014   Past Surgical History:  Procedure Laterality Date   ABDOMINAL HYSTERECTOMY  2006   total   esophageal     stretching   HERNIA REPAIR N/A 2022   hiatal hernia   laporoscopy     LEFT HEART CATH AND CORONARY  ANGIOGRAPHY N/A 06/02/2019   Procedure: LEFT HEART CATH AND CORONARY ANGIOGRAPHY;  Surgeon: Dann Candyce RAMAN, MD;  Location: [facility] INVAS[facility];  Service: Cardiovascular;  Laterality: N/A;   TONSILLECTOMY     TOTAL HIP ARTHROPLASTY Right 2010   UPPER GASTROINTESTINAL ENDOSCOPY     wisdom teeth extracted     Patient Active Problem List   Diagnosis Date Noted   Vitamin B 12 deficiency 05/30/2021   Anemia 03/16/2020   Coronary artery disease involving native coronary artery of native heart without angina pectoris 06/12/2019   Diastolic dysfunction 05/16/2019   Rheumatoid arthritis (HCC) 01/08/2018   Hand pain, right 10/15/2017   Vitamin D  deficiency 12/20/2016   Prediabetes 12/20/2016   Shortness of breath on exertion 11/27/2016   Morbid obesity (HCC)    Constipation 12/07/2015   BCC (basal cell carcinoma of skin) 10/05/2014   Rosacea 10/05/2014   Parkinson's disease (HCC) 05/11/2014   Screening for cervical cancer 07/17/2012   Foot pain, bilateral 06/19/2012   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   Depression with anxiety 11/02/2009   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  Lumbar back pain with radiculopathy affecting lower extremity 10/17/2007   Essential hypertension 07/05/2007   Osteoarthritis 07/05/2007   Hyperlipidemia 06/26/2007   H/O: iron  deficiency anemia 05/08/2007   Hiatal hernia with GERD 05/08/2007    ONSET DATE: 11/29/2023 (MD referral)  REFERRING DIAG: G20.A1 (ICD-10-CM) - Parkinson's disease without dyskinesia or fluctuating manifestations (HCC)   THERAPY DIAG:  Other abnormalities of gait and mobility  Unsteadiness on feet  Muscle weakness (generalized)  Rationale for Evaluation and Treatment: Rehabilitation  SUBJECTIVE:                                                                                                                                                                                             SUBJECTIVE STATEMENT: I feel stronger and  more confident.  Didn't even bring the walking pole in today.  Pt accompanied by: self  PERTINENT HISTORY: Parkinson's disease, gastroparesis, anxiety, hx of chest pain, knee OA, RA  PAIN:  Are you having pain? No  PRECAUTIONS: Fall  RED FLAGS: None   WEIGHT BEARING RESTRICTIONS: No  FALLS: Has patient fallen in last 6 months? No  LIVING ENVIRONMENT: Lives with: lives with their spouse Lives in: House/apartment Stairs: stairs in home with rails Has following equipment at home: Vannie - 4 wheeled and bilateral walking poles  PLOF: Independent, Independent with household mobility with device, and Independent with community mobility with device  PATIENT GOALS: To try to combat the slowness and work on the dizziness.  OBJECTIVE:   PD fitness class information:  Pt may look into aquatics classes at the [employer] For strengthening:  2# and 1 set of 10 is what she has been doing.   TODAY'S TREATMENT: 03/14/2024/2025 Activity Comments  Standing hip abduction, 2 x 10 Hip extension, 2 x 10 Knee flexion 2 x 10 Marching in place x 10 3#-good form  Discussed progression of weighted exercises   Gait x 50 ft, 4 additional reps with weighted carry of 8# Cues for posture, abdominal activation Additional cognitive activity  Four square step activity To targets, over obstacles  Forward/back walking x 2 minutes With cognitive conversation tasks      Reviewed pt's HEP charts that organize her HEP-good return demo of above strengthening exercises and good form throughout Discussed weekly schedule for ex: Mondays-[facility] at [employer] 3:30 pm Tuesdays-will start dance in the fall Wednesdays-[employer] 10 am Thursdays- Fridays-Prince at [facility], Chair Yoga   Provided ideas for other days to help challenge her current exercise program:  Tai Chi at [facility], aquatic exercise class at [facility] or [address], Dance class at [employer] (new and pt would need to  ask about the details for this).     HOME EXERCISE PROGRAM:  Access Code: [email or web address] URL: [email or web address] Date: 02/18/2024 Prepared by: [facility] Outpatient  Rehab - [facility]  Program Notes Pick 1-2 paths in your house that you would typically walk.  Count steps in your typical walking pattern.  THEN:  Walk this path with FEWER STEPS is you your goal.*ON YOUR CUSHION:  1) Stand with feet apart and turn your head side to side/up and down with eyes closed.  2)  Face the counter-side step off and on  Exercises - Side Stepping with Counter Support  - 1-2 x daily - 7 x weekly - 1 sets - 2-3 min hold - Backward Walking with Counter Support  - 1-2 x daily - 7 x weekly - 1 sets - 2-3 min hold - Alternating Step Taps with Counter Support  - 1 x daily - 7 x weekly - 2 sets - 10 reps - Corner Balance Feet Apart: Eyes Closed With Head Turns  - 1 x daily - 7 x weekly - 3 sets - 5 reps - Stride Stance Weight Shift  - 1 x daily - 7 x weekly - 2 sets - 10 reps - Alternating Heel Raises  - 1 x daily - 7 x weekly - 2 sets - 10 reps - Seated Gaze Stabilization with Head Rotation  - 1 x daily - 5 x weekly - 2-3 sets - 20-30 sec  hold - Feet Together Balance at [employer] Eyes Closed  - 1 x daily - 7 x weekly - 3 sets - 5 reps - Standing Gastroc Stretch at Counter  - 1-2 x daily - 7 x weekly - 1 sets - 3 reps - 30 sec hold - Seated Long Arc Quad with Ankle Weight  - 1 x daily - 3 x weekly - 2 sets - 10 reps - Seated Hip Flexion March with Ankle Weights  - 1 x daily - 3 x weekly - 2 sets - 10 reps - Standing Hip Extension with Ankle Weight  - 1 x daily - 3 x weekly - 2 sets - 10 reps - Standing Hip Abduction with Ankle Weight  - 1 x daily - 3 x weekly - 2 sets - 10 reps - Standing Knee Flexion with Ankle Weight  - 1 x daily - 3 x weekly - 2 sets - 10 reps       PATIENT EDUCATION: Education details: Progressing exercises at home, dual tasking activities at home Person educated: Patient Education method:  Explanation, Demonstration, and Handouts Education comprehension: verbalized understanding, returned demonstration, and needs further education      Ways to offset your blood pressure fluctuations (lowering):  1)  Talk to Dr. Katrinka  2)  Stay well hydrated through your day with plenty of water  3)  If you sit for long periods-do several exercises for your legs before you stand up  -ankle pumps  -seated march  -seated leg kicks  4)  When you transition between lying down to sitting up or sitting to standing, take a moment to get your bearings to get set before you begin to move  5)  ??Consider asking MD about abdominal binder or compression socks ??-Probably not needed, given BP measures 01/15/2024  ---------------------------------------------------- Note: Objective measures were completed at Evaluation unless otherwise noted.  DIAGNOSTIC FINDINGS: NA for this episode  COGNITION: Overall cognitive status: Within functional limits for tasks assessed   SENSATION: Light touch: [facility]  EDEMA: Pt reports some BLE swelling and being monitored by MD   POSTURE: rounded shoulders and forward head  VITALS:   142/90, HR 71 bpm Seated   144/79  HR 74 bpm  Standing    LOWER EXTREMITY ROM:   AROM WFL   LOWER EXTREMITY MMT:  Grossly tested at least 4/5 BLEs   TRANSFERS: Sit to stand: Modified independence  Assistive device utilized: None     Stand to sit: Modified independence  Assistive device utilized: None     *Reports she needs to use hands most times to push up to stand from chairs at home*  GAIT:   FUNCTIONAL TESTS:  5 times sit to stand: 15.25 sec Timed up and go (TUG): 19.35 sed with walking pole (compared to 14 sec last bout of therapy)* 10 meter walk test: 17.98 sec (1.82 ft/sec) *compared to 2.52 ft/sec last bout of therapy* Mini-BESTest: 13/24 (decreased from 24/28 from last bout of therapy) TUG cognitive: 20.07 sec (compared to 15 sec last bout of therapy)* 3  M backwards walk:  11.94 sec                                                                                                                              TREATMENT DATE: 12/20/2023    PATIENT EDUCATION: Education details: Initial eval results, POC; encouraged general activity and walking with 4WW; preparation for neurologist visit to discuss symptoms/dizziness/slowed mobility  Person educated: Patient Education method: Explanation Education comprehension: verbalized understanding   GOALS: Goals reviewed with patient? Yes  SHORT TERM GOALS: Target date: 01/18/2024  Pt will be independent with HEP for improved balance, strength, gait. Baseline: Goal status: MET, 01/15/2024  2.  Pt will improve 5x sit<>stand to less than or equal to 12.5 sec to demonstrate improved functional strength and transfer efficiency. Baseline: 15.25 sec; 13.08 sec 01/17/24 Goal status: IN PROGRESS 01/17/24  3.  Pt will improve TUG score to less than or equal to 15 sec for decreased fall risk. Baseline: 19 sec; 16.19 sec 01/17/24>13.34 sec Goal status: MET, 02/05/2024  LONG TERM GOALS: Target date: 02/15/2024>03/21/2024   Pt will be independent with HEP for improved balance, strength, gait. Baseline:  Goal status: MET, 02/14/2024  2.  Pt will improve MiniBESTest score to at least 20/28 to decrease fall risk. Baseline: 13/28>23/28 Goal status: MET, 02/14/2024  3.  Pt will improve gait velocity to at least 2.3 ft/sec for improved gait efficiency and safety.  Baseline: 1.82 ft/sec>2.52 ft/sec Goal status: MET, 02/05/2024  4.  Pt will verbalize plans for continued community fitness to maximize gains made in PT. Baseline:  Goal status: IN PROGRESS, 02/14/2024  5.  Pt will improve 3 MWT backwards to less than or equal to 5 sec, for decreased fall risk.  Baseline:  6.91 sec  Goal status:  INITIAL, 02/14/2024  6.  Pt will improve SLS to at least 5 sec for improved obstacle and step negotiation.  Baseline:  3-4  sec  Goal status:  INITIAL, 02/14/2024   ASSESSMENT:  CLINICAL IMPRESSION: Pt presents today with no new complaints. Skilled PT session focused on progressing weights with standing lower extremity strengthening and pt able to use 3# without difficulty. Also worked on dual task activities with gait, with slightly slowed pattern.  Anticipate checking goals and plan for discharge next visit.  Pt without complaints upon leaving.    OBJECTIVE IMPAIRMENTS: Abnormal gait, decreased balance, decreased mobility, difficulty walking, decreased strength, and dizziness.   ACTIVITY LIMITATIONS: standing, transfers, and locomotion level  PARTICIPATION LIMITATIONS: meal prep, cleaning, laundry, driving, shopping, and community activity  PERSONAL FACTORS: 3+ comorbidities: see above PMH; increasing dizziness/unsteadiness in past 1-2 months are also affecting patient's functional outcome.   REHAB POTENTIAL: Good  CLINICAL DECISION MAKING: Evolving/moderate complexity  EVALUATION COMPLEXITY: Moderate  PLAN:  PT FREQUENCY: 2x/week  PT DURATION: 8 weeks plus eval visit  PLANNED INTERVENTIONS: 97750- Physical Performance Testing, 97110-Therapeutic exercises, 97530- Therapeutic activity, [id number]- Neuromuscular re-education, 97535- Self Care, [id number]- Manual therapy, [id number]- Gait training, Patient/Family education, Balance training, and Vestibular training  PLAN FOR NEXT SESSION:    Check LTGs and plan for d/c.  Review strengthening portion of HEP and discuss progression to next weight level.  Gait with weighted carry.  Schedule return PT screensGLENWOOD Greig Anon, PT 03/14/24 9:28 AM Phone: [phone] Fax: [phone]  [facility] at [facility] [address] [place], [address] [place], [place] [address] Phone # [phone] Fax # [phone]

## 2024-03-17 ENCOUNTER — Encounter: Payer: Self-pay | Admitting: Physical Therapy

## 2024-03-17 ENCOUNTER — Ambulatory Visit: Admitting: Physical Therapy

## 2024-03-17 DIAGNOSIS — M6281 Muscle weakness (generalized): Secondary | ICD-10-CM | POA: Diagnosis not present

## 2024-03-17 DIAGNOSIS — R2689 Other abnormalities of gait and mobility: Secondary | ICD-10-CM

## 2024-03-17 DIAGNOSIS — R29818 Other symptoms and signs involving the nervous system: Secondary | ICD-10-CM | POA: Diagnosis not present

## 2024-03-17 DIAGNOSIS — R2681 Unsteadiness on feet: Secondary | ICD-10-CM

## 2024-03-17 NOTE — Progress Notes (Unsigned)
 Assessment/Plan:   1.  Parkinsons Disease             -Continue pramipexole  0.5 mg 3 times per day.  Higher dosages with SE and still with minimal compulsive SE (cleaning house)             -Continue carbidopa /levodopa  25/100, 2/2/1 (can chew half to 1 in the middle of the night if awakens with tremor).   I sent new RX to pharm             -She will continue Carbidopa /levodopa  50/200 at night.  This has definitely helped restless leg and has helped most of the nighttime cramping of the feet and legs.  -continue entacapone , 200 mg with each dose of levodopa .  She thought it was making her dizzy, but we stopped it and it did not change the dizziness (but stopping losarten did).  -she is following with dermatology at [facility] dermatology  -We discussed Vyalev, which is foscarbidopa/foslevodopa pump that is newly FDA approved.  We discussed that it is for motor fluctuations in adults with advanced Parkinson's disease.  We discussed that this likely will not be on Medicare formulary until the latter half of 2025.  We discussed risks and benefits of this drug.   She was shown this today.  She is interested when insurance approves 2.  Depression and anxiety             - She is on a combination of Lexapro  and Cymbalta .  She is following with Dr. Vickey  -Patient is following with a counselor. 3.  Dysphagia             -last MBE May, 2021 with mild pharyngeal cervical esophageal deficits.    -following with GI  -stable right now 4.  Rheumatoid arthritis             -off of methotrexate.  Following with [facility] rheumatology 5.  REM behavior disorder             -Understands safety associated with this.  Does not want more medications. 6.  Gastroparesis             -Following with Dr. Philippa.  She plans to make another appt to discuss.  -Failed Linzess  and trulance  7.  Excess sweating  -is long term but discussed with her that I don't want to add meds b/c clonidine is tx and she already has dizziness.     -cymbalta  may be contributing to the sweating but she tried to d/c and was crying all the time.  Dr. Vickey is working on tapering it down.  She is currently on 20 mg.  She is on Lexapro . 9.  EDS  -Saw Dr. Buck in October, 2024 and nocturnal polysomnogram was negative.  - Unfortunately, post negative polysomnogram, she was given no further recommendations for the sleep issues. 10.  Urinary incontinence  -has done pelvic floor PT with benefit at day but struggles at night still 11.  Neurogenic Orthostatic Hypotension  -improved with d/c of losarten.  Still on coreg  but sx's are resolved.    Subjective:   Ronal HERO Eckrich was seen today in follow up for Parkinsons disease.  My previous records were reviewed prior to todays visit as well as outside records available to me.  Husband with patient and supplements history.  Last visit, we restarted her entacapone .  She previously thought it made her dizzy, but we stopped it and that did not change the  dizziness so we ended up restarting it.  She was orthostatic in our office last visit and I wondered if her Coreg /losartan  could be decreased and told her she needs to follow-up with her primary care and she reports that her losartan  was d/c and she is no longer dizzy.  She has been attending physical therapy.  Notes reviewed.  She is still seeing Dr. Vickey.  Last visit with June 20 and I did review those notes.  She is following with a counselor as well.  No falls.  No swallow trouble right now.  Current prescribed movement disorder medications: Carbidopa /levodopa  25/100, 2 tablets in the morning, 2 in the afternoon and 1 in the evening (7am/then between 11am-noon/4pm) Entacapone , 200 mg, 1 tablet 3 times per day with each dose of levodopa  (restarted last visit) Carbidopa /levodopa  50/200 at bedtime Pramipexole  0.5 mg 3 times per day   Prior meds: pramipexole  (higher dosages with mild compulsive behavior - cleaning/some shopping); entacapone  (stopped it  because he was dizzy, but the dizziness persisted so he restarted it)   ALLERGIES:   Allergies  Allergen Reactions   Scopolamine Other (See Comments)    Panic hallu  Other Reaction(s): Hallucination, Not available   Prednisone  Dermatitis    Facial ulcers  Other Reaction(s): facial swelling   Bactrim [Sulfamethoxazole-Trimethoprim] Other (See Comments)    Oral ulcers and rash   Penicillins Hives and Other (See Comments)    Did it involve swelling of the face/tongue/throat, SOB, or low BP? No  Did it involve sudden or severe rash/hives, skin peeling, or any reaction on the inside of your mouth or nose? No  Did you need to seek medical attention at a hospital or doctor's office? No  When did it last happen?      30+ years ago  If all above answers are "NO", may proceed with cephalosporin use.   Sulfa Antibiotics     Other Reaction(s): Not available    CURRENT MEDICATIONS:  Outpatient Encounter Medications as of 03/19/2024  Medication Sig   carbidopa -levodopa  (SINEMET  CR) 50-200 MG tablet TAKE 1 TABLET BY MOUTH EVERYDAY AT BEDTIME   carbidopa -levodopa  (SINEMET  IR) 25-100 MG tablet TAKE 2 TABLETS BY MOUTH AT 7AM, 2 TABLETS AT 11AM, AND 1 TABLET AT 4PM   carvedilol  (COREG ) 3.125 MG tablet TAKE 1 TABLET BY MOUTH TWICE A DAY WITH FOOD   DULoxetine  (CYMBALTA ) 20 MG capsule Take 1 capsule (20 mg total) by mouth daily.   entacapone  (COMTAN ) 200 MG tablet Take 1 tablet (200 mg total) by mouth 3 (three) times daily.   escitalopram  (LEXAPRO ) 20 MG tablet Take 1 tablet (20 mg total) by mouth daily.   omeprazole  (PRILOSEC) 20 MG capsule TAKE 1 CAPSULE BY MOUTH 2 TIMES DAILY AS NEEDED.   polyethylene glycol (MIRALAX  / GLYCOLAX ) 17 g packet Take 17 g by mouth daily as needed for mild constipation.   pramipexole  (MIRAPEX ) 0.5 MG tablet TAKE 1 TABLET BY MOUTH THREE TIMES A DAY   rosuvastatin  (CRESTOR ) 10 MG tablet TAKE 1 TABLET BY MOUTH ON MONDAYS,WEDNESDAYS, AND FRIDAYS   Vitamin D ,  Ergocalciferol , (DRISDOL ) 1.25 MG (50000 UNIT) CAPS capsule TAKE 1 CAPSULE (50,000 UNITS TOTAL) BY MOUTH EVERY 7 (SEVEN) DAYS   No facility-administered encounter medications on file as of 03/19/2024.    Objective:   PHYSICAL EXAMINATION:    VITALS:   Vitals:   03/19/24 0801  BP: 122/84  Pulse: 74  SpO2: 96%  Weight: 235 lb 12.8 oz (107 kg)  Height: 5'  4 (1.626 m)    No data found.    GEN:  The patient appears stated age and is in NAD. HEENT:  Normocephalic, atraumatic.  The mucous membranes are moist.  CV:  RRR Lungs:  CTAB Neck:  no bruits  Neurological examination:  Orientation: The patient is alert and oriented x3. Cranial nerves: There is good facial symmetry without significant hypomimia.  EOMI.   The speech is fluent and clear. Soft palate rises symmetrically and there is no tongue deviation. Hearing is intact to conversational tone. Sensation: Sensation is intact to light touch throughout Motor: Strength is at least antigravity x4.  Movement examination: Tone: There is nl tone today in the UE/LE Abnormal movements: mild R foot tremor, stable Coordination:  There is good RAMs today in the bilateral UE/LE.  No decremation (stable) Gait and Station: The patient lightly pushes off. The patients gait is tenuous but ambulates well.  Good but purposeful arm swing today.  I have reviewed and interpreted the following labs independently    Chemistry      Component Value Date/Time   NA 140 01/03/2024 1125   NA 144 12/05/2019 0935   K 3.8 01/03/2024 1125   CL 105 01/03/2024 1125   CO2 27 01/03/2024 1125   BUN 16 01/03/2024 1125   BUN 13 12/05/2019 0935   CREATININE 0.66 01/03/2024 1125   CREATININE 0.71 01/04/2015 1046      Component Value Date/Time   CALCIUM  9.7 01/03/2024 1125   ALKPHOS 94 01/03/2024 1125   AST 24 01/03/2024 1125   ALT 12 01/03/2024 1125   BILITOT 0.7 01/03/2024 1125   BILITOT 0.4 11/27/2016 1123       Lab Results  Component Value  Date   WBC 6.6 01/03/2024   HGB 12.8 01/03/2024   HCT 38.3 01/03/2024   MCV 89.9 01/03/2024   PLT 211.0 01/03/2024    Lab Results  Component Value Date   TSH 1.10 01/12/2022     Total time spent on today's visit was 34 minutes, including both face-to-face time and nonface-to-face time.  Time included that spent on review of records (prior notes available to me/labs/imaging if pertinent), discussing treatment and goals, answering patient's questions and coordinating care.  Cc:  Katrinka Garnette KIDD, MD

## 2024-03-17 NOTE — Therapy (Signed)
 OUTPATIENT PHYSICAL THERAPY NEURO TREATMENT NOTE/DISCHARGE SUMMARY   Patient Name: Kristin [name] MRN: [id number] DOB:1950/11/06, 73 y.o., female Today's Date: 03/17/2024   PCP:    Katrinka Garnette KIDD, MD   REFERRING PROVIDER: Tat, Asberry RAMAN, DO   PHYSICAL THERAPY DISCHARGE SUMMARY  Visits from Start of Care: 17  Current functional level related to goals / functional outcomes: See below-pt has met all but 1 LTG   Remaining deficits: High level balance, dual tasking   Education / Equipment: HEP, community fitness   Patient agrees to discharge. Patient goals were met. Patient is being discharged due to being pleased with the current functional level.  Recommend PD screens (PT, OT, speech) in 6-9 months due to progressive nature of disease process.     END OF SESSION:  PT End of Session - 03/17/24 1233     Visit Number 17    Number of Visits 17    Date for PT Re-Evaluation 03/21/24    Authorization Type Medicare/Aetna    PT Start Time 1234    PT Stop Time 1313    PT Time Calculation (min) 39 min    Activity Tolerance Patient tolerated treatment well    Behavior During Therapy WFL for tasks assessed/performed                         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   Hyperlipidemia    Hypertension    Infertility, female    Low back pain 10/17/2007   Qualifier: Diagnosis of  By: Mavis MD,  Norleen BRAVO    Measles as a child   Obesity    Osteoarthritis    Parkinson disease (HCC)    Plantar fasciitis of left foot 06/19/2012   Preventative health care 12/19/2015   Rheumatoid arthritis (HCC) 01/08/2018   Rosacea 10/05/2014   Swallowing difficulty    Urinary frequency 02/25/2013   Visual floaters 05/11/2014   Past Surgical History:  Procedure Laterality Date   ABDOMINAL HYSTERECTOMY  2006   total   esophageal     stretching   HERNIA REPAIR N/A 2022   hiatal hernia   laporoscopy     LEFT HEART CATH AND CORONARY ANGIOGRAPHY N/A 06/02/2019   Procedure: LEFT HEART CATH AND CORONARY ANGIOGRAPHY;  Surgeon: Dann Candyce RAMAN, MD;  Location: [facility];  Service: Cardiovascular;  Laterality: N/A;   TONSILLECTOMY     TOTAL HIP ARTHROPLASTY Right 2010   UPPER GASTROINTESTINAL ENDOSCOPY     wisdom teeth extracted     Patient Active Problem List   Diagnosis Date Noted   Vitamin B 12 deficiency 05/30/2021   Anemia 03/16/2020   Coronary artery disease involving native coronary artery of native heart without angina pectoris 06/12/2019   Diastolic dysfunction 05/16/2019  Rheumatoid arthritis (HCC) 01/08/2018   Hand pain, right 10/15/2017   Vitamin D  deficiency 12/20/2016   Prediabetes 12/20/2016   Shortness of breath on exertion 11/27/2016   Morbid obesity (HCC)    Constipation 12/07/2015   BCC (basal cell carcinoma of skin) 10/05/2014   Rosacea 10/05/2014   Parkinson's disease (HCC) 05/11/2014   Screening for cervical cancer 07/17/2012   Foot pain, bilateral 06/19/2012   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   Depression with anxiety 11/02/2009   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   Lumbar back pain with radiculopathy affecting lower extremity 10/17/2007   Essential hypertension 07/05/2007   Osteoarthritis 07/05/2007   Hyperlipidemia 06/26/2007   H/O: iron  deficiency anemia 05/08/2007   Hiatal hernia with GERD 05/08/2007    ONSET DATE: 11/29/2023 (MD  referral)  REFERRING DIAG: G20.A1 (ICD-10-CM) - Parkinson's disease without dyskinesia or fluctuating manifestations (HCC)   THERAPY DIAG:  Other abnormalities of gait and mobility  Unsteadiness on feet  Other symptoms and signs involving the nervous system  Rationale for Evaluation and Treatment: Rehabilitation  SUBJECTIVE:                                                                                                                                                                                             SUBJECTIVE STATEMENT: Did the standing on the cushion today.    Pt accompanied by: self  PERTINENT HISTORY: Parkinson's disease, gastroparesis, anxiety, hx of chest pain, knee OA, RA  PAIN:  Are you having pain? No  PRECAUTIONS: Fall  RED FLAGS: None   WEIGHT BEARING RESTRICTIONS: No  FALLS: Has patient fallen in last 6 months? No  LIVING ENVIRONMENT: Lives with: lives with their spouse Lives in: House/apartment Stairs: stairs in home with rails Has following equipment at home: Vannie - 4 wheeled and bilateral walking poles  PLOF: Independent, Independent with household mobility with device, and Independent with community mobility with device  PATIENT GOALS: To try to combat the slowness and work on the dizziness.  OBJECTIVE:     TODAY'S TREATMENT: 03/17/2024 Activity Comments  3 M walk backwards: 8.78 sec   FTSTS:  11.56 sec   26M walk:  12.53 sec (2.62 ft/sec) 10 M walk Stroop:  14.84 sec/15.90 sec   TUG:  12.79 sec TUG cognitive:  14.21 sec   MiniBESTest 24/28      Compared to eval measures 5 times sit to stand: 15.25 sec Timed up and go (TUG): 19.35 sec with walking pole (compared to 14 sec last bout of therapy)* 10 meter walk test: 17.98 sec (1.82 ft/sec) *compared to 2.52 ft/sec last bout of therapy* Mini-BESTest: 13/28 (decreased from  24/28 from last bout of therapy) TUG cognitive: 20.07 sec (compared to 15 sec last bout of therapy)* 3 M  backwards walk:  11.94 sec   Reviewed and discussed weekly schedule for ex: Mondays-[facility] at [facility] 3:30 pm Tuesdays-will start dance in the fall Wednesdays-[employer] 10 am Thursdays- Fridays-[facility] at [facility], Chair Yoga   Reviewed ideas for other days to help challenge her current exercise program:  Tai Chi at [facility], aquatic exercise class at [facility] or [address], Dance class at [employer] (new and pt would need to ask about the details for this).    Discussed walking as part of HEP  HOME EXERCISE PROGRAM:  Access Code: [email or web address] URL: [email or web address] Date: 02/18/2024 Prepared by: [facility] - Outpatient  Rehab - [facility]  Program Notes Pick 1-2 paths in your house that you would typically walk.  Count steps in your typical walking pattern.  THEN:  Walk this path with FEWER STEPS is you your goal.*ON YOUR CUSHION:  1) Stand with feet apart and turn your head side to side/up and down with eyes closed.  2)  Face the counter-side step off and on  Exercises - Side Stepping with Counter Support  - 1-2 x daily - 7 x weekly - 1 sets - 2-3 min hold - Backward Walking with Counter Support  - 1-2 x daily - 7 x weekly - 1 sets - 2-3 min hold - Alternating Step Taps with Counter Support  - 1 x daily - 7 x weekly - 2 sets - 10 reps - Corner Balance Feet Apart: Eyes Closed With Head Turns  - 1 x daily - 7 x weekly - 3 sets - 5 reps - Stride Stance Weight Shift  - 1 x daily - 7 x weekly - 2 sets - 10 reps - Alternating Heel Raises  - 1 x daily - 7 x weekly - 2 sets - 10 reps - Seated Gaze Stabilization with Head Rotation  - 1 x daily - 5 x weekly - 2-3 sets - 20-30 sec  hold - Feet Together Balance at [employer] Eyes Closed  - 1 x daily - 7 x weekly - 3 sets - 5 reps - Standing Gastroc Stretch at Counter  - 1-2 x daily - 7 x weekly - 1 sets - 3 reps - 30 sec hold - Seated Long Arc Quad with Ankle Weight  - 1 x daily - 3 x weekly - 2 sets - 10 reps - Seated Hip Flexion  March with Ankle Weights  - 1 x daily - 3 x weekly - 2 sets - 10 reps - Standing Hip Extension with Ankle Weight  - 1 x daily - 3 x weekly - 2 sets - 10 reps - Standing Hip Abduction with Ankle Weight  - 1 x daily - 3 x weekly - 2 sets - 10 reps - Standing Knee Flexion with Ankle Weight  - 1 x daily - 3 x weekly - 2 sets - 10 reps       PATIENT EDUCATION: Education details: Progress towards goals, POC, plans for discharge this visit and return screens in 6-9 months Person educated: Patient Education method: Explanation Education comprehension: verbalized understanding      Ways to offset your blood pressure fluctuations (lowering):  1)  Talk to Dr. Katrinka  2)  Stay well hydrated through your day with plenty of water  3)  If you sit for long periods-do several exercises for your legs before you stand up  -  ankle pumps  -seated march  -seated leg kicks  4)  When you transition between lying down to sitting up or sitting to standing, take a moment to get your bearings to get set before you begin to move  5)  ??Consider asking MD about abdominal binder or compression socks ??-Probably not needed, given BP measures 01/15/2024  ---------------------------------------------------- Note: Objective measures were completed at Evaluation unless otherwise noted.  DIAGNOSTIC FINDINGS: NA for this episode  COGNITION: Overall cognitive status: Within functional limits for tasks assessed   SENSATION: Light touch: WFL  EDEMA: Pt reports some BLE swelling and being monitored by MD   POSTURE: rounded shoulders and forward head  VITALS:   142/90, HR 71 bpm Seated   144/79  HR 74 bpm  Standing    LOWER EXTREMITY ROM:   AROM WFL   LOWER EXTREMITY MMT:  Grossly tested at least 4/5 BLEs   TRANSFERS: Sit to stand: Modified independence  Assistive device utilized: None     Stand to sit: Modified independence  Assistive device utilized: None     *Reports she needs to use hands  most times to push up to stand from chairs at home*  GAIT:   FUNCTIONAL TESTS:  5 times sit to stand: 15.25 sec Timed up and go (TUG): 19.35 sed with walking pole (compared to 14 sec last bout of therapy)* 10 meter walk test: 17.98 sec (1.82 ft/sec) *compared to 2.52 ft/sec last bout of therapy* Mini-BESTest: 13/24 (decreased from 24/28 from last bout of therapy) TUG cognitive: 20.07 sec (compared to 15 sec last bout of therapy)* 3 M backwards walk:  11.94 sec                                                                                                                              TREATMENT DATE: 12/20/2023    PATIENT EDUCATION: Education details: Initial eval results, POC; encouraged general activity and walking with 4WW; preparation for neurologist visit to discuss symptoms/dizziness/slowed mobility  Person educated: Patient Education method: Explanation Education comprehension: verbalized understanding   GOALS: Goals reviewed with patient? Yes  SHORT TERM GOALS: Target date: 01/18/2024  Pt will be independent with HEP for improved balance, strength, gait. Baseline: Goal status: MET, 01/15/2024  2.  Pt will improve 5x sit<>stand to less than or equal to 12.5 sec to demonstrate improved functional strength and transfer efficiency. Baseline: 15.25 sec; 13.08 sec 01/17/24>12.5 sec 03/17/2024 Goal status: MET 03/17/2024  3.  Pt will improve TUG score to less than or equal to 15 sec for decreased fall risk. Baseline: 19 sec; 16.19 sec 01/17/24>13.34 sec Goal status: MET, 02/05/2024  LONG TERM GOALS: Target date: 02/15/2024>03/21/2024   Pt will be independent with HEP for improved balance, strength, gait. Baseline:  Goal status: MET, 02/14/2024  2.  Pt will improve MiniBESTest score to at least 20/28 to decrease fall risk. Baseline: 13/28>23/28>24/28 03/17/2024 Goal status: MET, 02/14/2024  3.  Pt will improve  gait velocity to at least 2.3 ft/sec for improved gait efficiency and  safety.  Baseline: 1.82 ft/sec>2.52 ft/sec>2.62 ft/sec 03/17/2024 Goal status: MET, 02/05/2024  4.  Pt will verbalize plans for continued community fitness to maximize gains made in PT. Baseline:  Goal status: MET, 03/17/2024  5.  Pt will improve 3 MWT backwards to less than or equal to 5 sec, for decreased fall risk.  Baseline:  6.91 sec>8.78 sec 03/17/2024  Goal status:  NOT MET, 03/17/2024  6.  Pt will improve SLS to at least 5 sec for improved obstacle and step negotiation.  Baseline:  3-4 sec  Goal status:  PARTIALLY MET 03/17/2024   ASSESSMENT:  CLINICAL IMPRESSION: Pt presents today and has no new complaints. Skilled PT session focused on assessing LTGs. Pt has met LTG 1-4, not met LTG 5 for backwards walk and has partially met LTG 6 for SLS.  She has significantly improved on majority of balance and mobility measures since eval and her functional mobility levels are very similar in comparison to her previous bout of therapy.  She is pleased with her functional level and feels comfortable with her current exercise program and community fitness activities.  Pt is appropriate for discharge at this time.  OBJECTIVE IMPAIRMENTS: Abnormal gait, decreased balance, decreased mobility, difficulty walking, decreased strength, and dizziness.   ACTIVITY LIMITATIONS: standing, transfers, and locomotion level  PARTICIPATION LIMITATIONS: meal prep, cleaning, laundry, driving, shopping, and community activity  PERSONAL FACTORS: 3+ comorbidities: see above PMH; increasing dizziness/unsteadiness in past 1-2 months are also affecting patient's functional outcome.   REHAB POTENTIAL: Good  CLINICAL DECISION MAKING: Evolving/moderate complexity  EVALUATION COMPLEXITY: Moderate  PLAN:  PT FREQUENCY: 2x/week  PT DURATION: 8 weeks plus eval visit  PLANNED INTERVENTIONS: 97750- Physical Performance Testing, 97110-Therapeutic exercises, 97530- Therapeutic activity, 97112- Neuromuscular re-education,  97535- Self Care, [id number]- Manual therapy, [phone]- Gait training, Patient/Family education, Balance training, and Vestibular training  PLAN FOR NEXT SESSION:    Discharge this visit; plan for return screens in 6-9 months   Greig Anon, PT 03/17/24 1:13 PM Phone: [phone] Fax: [phone]  [facility] at [facility] [address], [address] [place], [place] [address] Phone # [phone] Fax # [phone]

## 2024-03-18 ENCOUNTER — Telehealth: Payer: Self-pay | Admitting: Family Medicine

## 2024-03-18 DIAGNOSIS — Z780 Asymptomatic menopausal state: Secondary | ICD-10-CM

## 2024-03-18 NOTE — Telephone Encounter (Signed)
 Please call and schedule bone density.  Copied from CRM [id number]. Topic: Appointments - Scheduling Inquiry for Clinic >> Mar 18, 2024  3:50 PM Kristin [name] wrote: Reason for CRM: Patient calling to schedule for Bone Density test. Please reach out when available to schedule  .

## 2024-03-18 NOTE — Telephone Encounter (Unsigned)
 Copied from CRM [id number]. Topic: Clinical - Medical Advice >> Mar 18, 2024 10:00 AM Gennette ORN wrote: Reason for CRM: Patient wants a bones density test done but wants to know should she go to Dr.Hunter or another doctor.

## 2024-03-18 NOTE — Telephone Encounter (Signed)
 Noted

## 2024-03-18 NOTE — Telephone Encounter (Signed)
 Called pt to get scheduled for DEXA. Patient stated she just remembered she is scheduled to do this through her gynecologist. I informed patient this should be fine as long as the results are shared with us . Patient verbalized understanding.

## 2024-03-19 ENCOUNTER — Encounter: Payer: Self-pay | Admitting: Neurology

## 2024-03-19 ENCOUNTER — Ambulatory Visit (INDEPENDENT_AMBULATORY_CARE_PROVIDER_SITE_OTHER): Payer: Medicare Other | Admitting: Neurology

## 2024-03-19 DIAGNOSIS — G20B1 Parkinson's disease with dyskinesia, without mention of fluctuations: Secondary | ICD-10-CM

## 2024-03-19 MED ORDER — PRAMIPEXOLE DIHYDROCHLORIDE 0.5 MG PO TABS: 0.5000 mg | ORAL_TABLET | Freq: Three times a day (TID) | ORAL | 2 refills | Status: DC

## 2024-03-19 MED ORDER — ENTACAPONE 200 MG PO TABS: 200.0000 mg | ORAL_TABLET | Freq: Three times a day (TID) | ORAL | 2 refills | Status: DC

## 2024-03-19 NOTE — Patient Instructions (Signed)
 DIETARY GUIDELINES Research suggests that Parkinson's disease symptoms might be improved by increasing the levels of anti-oxidant and anti-inflammatory compounds in your brain. This means eating more whole, unprocessed plant foods. The more variety and color in your diet the better! Don't spend a lot of money on supplements, spend it on fresh healthy food; food is medicine! Eat what you need to eat to be happy, but eat it as a 'treat', not as a staple food, and eat a lot more of the food that is good for your health. Every healthy lifestyle change helps and most people do better making gradual minor changes that become major over time.  1) Eat at least 7-9 servings of fruits and vegetables a day, including:  Eat lots more berries (blueberries, cherries, raspberries, goji berries, cranberries) and fruits (apples, pears, oranges, plums, prunes, apricots, peaches, nectarines, watermelon, grapefruit, pineapple, bananas) fresh, frozen or dried, all are healthy options.   Eat lots more dark leafy greens (spinach, romaine lettuce, arugula, Swiss chard, turnip greens, radicchio), cruciferous vegetables (broccoli, cauliflower, cabbage, red cabbage, collard greens, kale, brussel sprouts) and allium vegetables (onion, shallot, garlic, spring onion). Cut these vegetables up in small pieces and let them sit for a few minutes before steaming or microwaving.   Eat lots more colorful vegetables like beets, asparagus, red pepper, tomatoes, carrots, sweet potato, squash, pumpkin and other vegetables that are bright green, red, yellow and don't always eat the same ones. If you are cooking them, steam them or microwave them.  2) On your salads, make your own dressing and include a source of healthy fat (almonds, flax seeds, walnuts, cashews, tahini, sunflower seeds, extra virgin olive oil) to help absorb the anti-oxidants.  3) Drink more liquid including lots of tea (white tea, green tea, rooibos tea, hibiscus tea, rosehip tea)  and less dairy milk, soda, and clear fruit juice. Coffee, beer and wine are good too, in moderation. 4) Eat LOTS more spices (turmeric, pepper, cloves, nutmeg, oregano, basil, [place] cinnamon, thyme, rosemary, ginger). Include these in your daily oatmeal, salads, roasted vegetables, etc. Try traditional blends of spices from different cultures. 5) Eat at least 1-2 tablespoons ground flax seeds daily. Ideally, buy them whole and grind them yourself as needed. Keep them and most foods with high fat refrigerated. 6) Eat more mushrooms, cooked. Any kind of mushroom is fine. 7) Don't eat lots of fish and if you do, eat Pacific wild caught salmon or small fatty fishes like mackerel and sardines. If you want to take supplemental omega-3 fatty acids, instead of fish oil, consider algae-derived omega-3 supplements which cost more, but may be better for you because they don't have the pollutants that are in most fish oil. 8) Eat lots more legumes which include kidney beans, black beans, garbanzo beans, black-eyed peas, fava beans, navy beans, white beans, broad beans, peanuts, green lentils, red lentils, tofu, miso, edamame, soybeans, tempeh, etc. 9) Eat lots more whole grains (oatmeal, brown rice, quinoa, buckwheat, barley, spelt, faro, bulgur wheat, whole wheat or multigrain bread, whole wheat pasta, soba or buckwheat noodles etc.) and less processed grains and grain products (white rice, white bread, enriched flour, fortified flour, cakes and pastries, products that say 'made with whole grains').  10) Try to eat enough whole plant foods, with enough dietary fiber to have at least one bowel movement a day, without stool softeners or laxatives.  11) If you become a vegan, (which means giving up meat, poultry, fish AND dairy products) consult your physician and  take B 12 supplements (2500mcg once/week).    Supplements in Parkinson Disease Don't spend a lot of money on supplements, spend it on fresh healthy food;  food is medicine! The dietary guidelines above are safe and effective for people with PD (and may reduce the risk of developing PD for people at higher risk). A brief note on mucuna: Mucuna pruriens is a legume that contains L-dopa. Some health food stores and online retailers sell mucuna extract in powdered or pill form, as an "alternative medicine" -- but it's not really alternative, it's just L-dopa! The amount of L-dopa found in mucuna is not standardized, and the extract may also contain fillers that can be at worst, directly toxic to the brain. I do not recommend taking mucuna instead of levodopa . A brief note on CBD: Cannabidiols (CBD) seems to be helpful for the management of stress and anxiety in Parkinson disease. On the downside, it might worsen apathy and lack of motivation. It does not seem to have an effect on the underlying disease course. Unfortunately, until it's federally legal, we will not be able to study it in research trials and figure out a standardized dosing strategy. If you choose to try CBD oil, try to find a strain that has minimal/no THC -- this will minimize the "high" of marijuana but will still lessen your anxiety

## 2024-03-25 DIAGNOSIS — K3184 Gastroparesis: Secondary | ICD-10-CM | POA: Diagnosis not present

## 2024-03-25 DIAGNOSIS — I10 Essential (primary) hypertension: Secondary | ICD-10-CM | POA: Diagnosis not present

## 2024-03-25 DIAGNOSIS — E66813 Obesity, class 3: Secondary | ICD-10-CM | POA: Diagnosis not present

## 2024-03-25 DIAGNOSIS — R7303 Prediabetes: Secondary | ICD-10-CM | POA: Diagnosis not present

## 2024-03-25 DIAGNOSIS — Z6841 Body Mass Index (BMI) 40.0 and over, adult: Secondary | ICD-10-CM | POA: Diagnosis not present

## 2024-03-25 DIAGNOSIS — E65 Localized adiposity: Secondary | ICD-10-CM | POA: Diagnosis not present

## 2024-04-02 DIAGNOSIS — H524 Presbyopia: Secondary | ICD-10-CM | POA: Diagnosis not present

## 2024-04-02 DIAGNOSIS — H25013 Cortical age-related cataract, bilateral: Secondary | ICD-10-CM | POA: Diagnosis not present

## 2024-04-02 DIAGNOSIS — H2513 Age-related nuclear cataract, bilateral: Secondary | ICD-10-CM | POA: Diagnosis not present

## 2024-04-02 DIAGNOSIS — H5203 Hypermetropia, bilateral: Secondary | ICD-10-CM | POA: Diagnosis not present

## 2024-04-03 ENCOUNTER — Other Ambulatory Visit: Payer: Self-pay | Admitting: Family Medicine

## 2024-04-04 DIAGNOSIS — L438 Other lichen planus: Secondary | ICD-10-CM | POA: Diagnosis not present

## 2024-04-04 DIAGNOSIS — Z85828 Personal history of other malignant neoplasm of skin: Secondary | ICD-10-CM | POA: Diagnosis not present

## 2024-04-04 DIAGNOSIS — L820 Inflamed seborrheic keratosis: Secondary | ICD-10-CM | POA: Diagnosis not present

## 2024-04-04 DIAGNOSIS — D225 Melanocytic nevi of trunk: Secondary | ICD-10-CM | POA: Diagnosis not present

## 2024-04-04 DIAGNOSIS — L814 Other melanin hyperpigmentation: Secondary | ICD-10-CM | POA: Diagnosis not present

## 2024-04-04 DIAGNOSIS — L821 Other seborrheic keratosis: Secondary | ICD-10-CM | POA: Diagnosis not present

## 2024-04-04 DIAGNOSIS — D1801 Hemangioma of skin and subcutaneous tissue: Secondary | ICD-10-CM | POA: Diagnosis not present

## 2024-04-05 NOTE — Progress Notes (Unsigned)
 Virtual Visit via Video Note  I connected with Kristin [name] on 04/11/24 at 10:30 AM EDT by a video enabled telemedicine application and verified that I am speaking with the correct person using two identifiers.  Location: Patient: home Provider: home office Persons participated in the visit- patient, provider    I discussed the limitations of evaluation and management by telemedicine and the availability of in person appointments. The patient expressed understanding and agreed to proceed.    I discussed the assessment and treatment plan with the patient. The patient was provided an opportunity to ask questions and all were answered. The patient agreed with the plan and demonstrated an understanding of the instructions.   The patient was advised to call back or seek an in-person evaluation if the symptoms worsen or if the condition fails to improve as anticipated.   [name], MD    [facility] MD/PA/NP OP Progress Note  04/11/2024 12:09 PM Kristin [name]  MRN:  [id number]  Chief Complaint:  Chief Complaint  Patient presents with   Follow-up   HPI:  This is a follow-up appointment for depression and anxiety.  She states that she has been doing better.  Her dizziness is gone.  She was able to complete physical therapy.  She also finds chair yoga to be very helpful.  She has been talking with her husband to walk outside.  She does not like it as much as she has concern as she wants to be around with other in case of any fall.  She has been doing it more in the gym.  She has been trying to eat 90 g protein.  She has been eating smaller bites.  She feels more comfortable eating out with her friends as she enjoys socialization.  Although she continues to sweat at times, it is not as upsetting.  She thinks it was coming from nervousness.  She now feels good.  She denies feeling depressed.  She has good sleep.  She denies SI, HI, hallucinations.  She denies panic attacks.  She shared that the idea  of tapering off duloxetine  makes her feel nervous. She is reassured that her medication will not be adjusted at this time so that she continues to engage in daily activities.    Wt Readings from Last 3 Encounters:  03/19/24 235 lb 12.8 oz (107 kg)  03/13/24 234 lb (106.1 kg)  02/19/24 234 lb (106.1 kg)     Household: son with autistic trait (employed) Marital status:married Number of children:3   Visit Diagnosis:    ICD-10-CM   1. MDD (major depressive disorder), recurrent, in partial remission (HCC)  F33.41     2. Anxiety disorder, unspecified type  F41.9       Past Psychiatric History: Please see initial evaluation for full details. I have reviewed the history. No updates at this time.     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   Hyperlipidemia    Hypertension    Infertility, female    Low back pain 10/17/2007   Qualifier: Diagnosis of  By: Mavis MD, Norleen BRAVO    Measles as a child   Obesity    Osteoarthritis    Parkinson disease (HCC)    Plantar fasciitis of left foot 06/19/2012   Preventative health care 12/19/2015   Rheumatoid arthritis (HCC) 01/08/2018   Rosacea 10/05/2014   Swallowing difficulty    Urinary frequency 02/25/2013   Visual floaters 05/11/2014    Past Surgical History:  Procedure Laterality Date   ABDOMINAL HYSTERECTOMY  2006   total   esophageal     stretching    HERNIA REPAIR N/A 2022   hiatal hernia   laporoscopy     LEFT HEART CATH AND CORONARY ANGIOGRAPHY N/A 06/02/2019   Procedure: LEFT HEART CATH AND CORONARY ANGIOGRAPHY;  Surgeon: Dann Candyce RAMAN, MD;  Location: [facility] INVASIVE CV LAB;  Service: Cardiovascular;  Laterality: N/A;   TONSILLECTOMY     TOTAL HIP ARTHROPLASTY Right 2010   UPPER GASTROINTESTINAL ENDOSCOPY     wisdom teeth extracted      Family Psychiatric History: Please see initial evaluation for full details. I have reviewed the history. No updates at this time.     Family History:  Family History  Problem Relation Age of Onset   Other Mother        arrythmia   Mental illness Mother        bipolar   Hyperlipidemia Mother    Thyroid  disease Mother    Depression Mother    Bipolar disorder Mother    Breast cancer Mother 60   Heart disease Father    Arthritis Father        rheumatoid   Hypertension Father    Hyperlipidemia Father    Depression Sister    Mental illness Sister        bipolar   Parkinson's disease Sister    Arthritis Sister    Arthritis Sister    Arthritis Sister    Breast cancer Maternal Aunt 35   Breast cancer Maternal Uncle        66s   Heart attack Paternal Uncle    Heart attack Paternal Grandfather    Breast cancer Cousin 11   Colon cancer Neg Hx    Esophageal cancer Neg Hx    Rectal cancer Neg Hx    Stomach cancer Neg Hx    Sleep apnea Neg Hx     Social History:  Social History   Socioeconomic History   Marital status: Married    Spouse name: Not on file   Number of children: 1   Years of education: Not on file   Highest education level: Master's degree (e.g., MA, MS, MEng, MEd, MSW, MBA)  Occupational History   Occupation: Retired International aid/development worker  Tobacco Use   Smoking status: Never   Smokeless tobacco: Never  Vaping Use   Vaping status: Never Used  Substance and Sexual Activity   Alcohol use: Yes    Alcohol/week: 1.0 standard drink of alcohol    Types: 1 Glasses of  wine per week    Comment: occasional wine   Drug use: No   Sexual activity: Not Currently    Partners: Male  Other Topics Concern   Not on file  Social History Narrative  Lives with husband, 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 3 grandsons- 2 in [place] and 13 year old in [place] in 2023      Hobbies: book club, time with friends, time with couples friends, plays pianos   Social Drivers of Corporate investment banker Strain: Low Risk  (03/10/2024)   Overall Financial Resource Strain (CARDIA)    Difficulty of Paying Living Expenses: Not hard at all  Food Insecurity: No Food Insecurity (03/10/2024)   Hunger Vital Sign    Worried About Running Out of Food in the Last Year: Never true    Ran Out of Food in the Last Year: Never true  Transportation Needs: No Transportation Needs (03/10/2024)   PRAPARE - Administrator, Civil Service (Medical): No    Lack of Transportation (Non-Medical): No  Physical Activity: Sufficiently Active (03/10/2024)   Exercise Vital Sign    Days of Exercise per Week: 5 days    Minutes of Exercise per Session: 60 min  Stress: No Stress Concern Present (03/10/2024)   [employer] of Occupational Health - Occupational Stress Questionnaire    Feeling of Stress: Only a little  Social Connections: Socially Integrated (03/10/2024)   Social Connection and Isolation Panel    Frequency of Communication with Friends and Family: More than three times a week    Frequency of Social Gatherings with Friends and Family: Three times a week    Attends Religious Services: 1 to 4 times per year    Active Member of Clubs or Organizations: Yes    Attends Banker Meetings: More than 4 times per year    Marital Status: Married    Allergies:  Allergies  Allergen Reactions   Scopolamine Other (See Comments)    Panic hallu  Other Reaction(s): Hallucination, Not  available   Prednisone  Dermatitis    Facial ulcers  Other Reaction(s): facial swelling   Bactrim [Sulfamethoxazole-Trimethoprim] Other (See Comments)    Oral ulcers and rash   Penicillins Hives and Other (See Comments)    Did it involve swelling of the face/tongue/throat, SOB, or low BP? No  Did it involve sudden or severe rash/hives, skin peeling, or any reaction on the inside of your mouth or nose? No  Did you need to seek medical attention at a hospital or doctor's office? No  When did it last happen?      30+ years ago  If all above answers are "NO", may proceed with cephalosporin use.   Sulfa Antibiotics     Other Reaction(s): Not available    Metabolic Disorder Labs: Lab Results  Component Value Date   HGBA1C 6.2 01/03/2024   MPG 126 (H) 12/29/2013   No results found for: PROLACTIN Lab Results  Component Value Date   CHOL 133 01/03/2024   TRIG 88.0 01/03/2024   HDL 54.20 01/03/2024   CHOLHDL 2 01/03/2024   VLDL 17.6 01/03/2024   LDLCALC 61 01/03/2024   LDLCALC 60 03/28/2023   Lab Results  Component Value Date   TSH 1.10 01/12/2022   TSH 1.20 10/03/2021    Therapeutic Level Labs: No results found for: LITHIUM No results found for: VALPROATE No results found for: CBMZ  Current Medications: Current Outpatient Medications  Medication Sig Dispense Refill   carbidopa -levodopa  (SINEMET  CR) 50-200 MG tablet TAKE 1 TABLET BY MOUTH EVERYDAY AT BEDTIME 90 tablet 0   carbidopa -levodopa  (SINEMET   IR) 25-100 MG tablet TAKE 2 TABLETS BY MOUTH AT 7AM, 2 TABLETS AT 11AM, AND 1 TABLET AT 4PM 450 tablet 0   carvedilol  (COREG ) 3.125 MG tablet TAKE 1 TABLET BY MOUTH TWICE A DAY WITH FOOD 180 tablet 3   [START ON 05/20/2024] DULoxetine  (CYMBALTA ) 20 MG capsule Take 1 capsule (20 mg total) by mouth daily. 90 capsule 0   entacapone  (COMTAN ) 200 MG tablet Take 1 tablet (200 mg total) by mouth 3 (three) times daily. 270 tablet 2   escitalopram  (LEXAPRO ) 20 MG tablet TAKE 1  TABLET BY MOUTH EVERY DAY 90 tablet 3   omeprazole  (PRILOSEC) 20 MG capsule TAKE 1 CAPSULE BY MOUTH 2 TIMES DAILY AS NEEDED. 180 capsule 1   polyethylene glycol (MIRALAX  / GLYCOLAX ) 17 g packet Take 17 g by mouth daily as needed for mild constipation.     pramipexole  (MIRAPEX ) 0.5 MG tablet Take 1 tablet (0.5 mg total) by mouth 3 (three) times daily. 270 tablet 2   rosuvastatin  (CRESTOR ) 10 MG tablet TAKE 1 TABLET BY MOUTH ON MONDAYS,WEDNESDAYS, AND FRIDAYS 39 tablet 3   Vitamin D , Ergocalciferol , (DRISDOL ) 1.25 MG (50000 UNIT) CAPS capsule TAKE 1 CAPSULE (50,000 UNITS TOTAL) BY MOUTH EVERY 7 (SEVEN) DAYS 12 capsule 1   No current facility-administered medications for this visit.     Musculoskeletal: Strength & Muscle Tone: N/A Gait & Station: N/A Patient leans: N/A  Psychiatric Specialty Exam: Review of Systems  There were no vitals taken for this visit.There is no height or weight on file to calculate BMI.  General Appearance: Well Groomed  Eye Contact:  Good  Speech:  Clear and Coherent  Volume:  Normal  Mood:  good  Affect:  Appropriate, Congruent, and Full Range  Thought Process:  Coherent  Orientation:  Full (Time, Place, and Person)  Thought Content: Logical   Suicidal Thoughts:  No  Homicidal Thoughts:  No  Memory:  Immediate;   Good  Judgement:  Good  Insight:  Good  Psychomotor Activity:  Normal  Concentration:  Concentration: Good and Attention Span: Good  Recall:  Good  Fund of Knowledge: Good  Language: Good  Akathisia:  No  Handed:  Right  AIMS (if indicated): not done  Assets:  Communication Skills Desire for Improvement  ADL's:  Intact  Cognition: WNL  Sleep:  Fair   Screenings: GAD-7    Garment/textile technologist Visit from 05/09/2023 in [facility] from 03/28/2023 in [facility] from 01/23/2023 in [facility]  Total  GAD-7 Score 5 6 0   Mini-Mental    Flowsheet Row Office Visit from 10/15/2017 in [facility]  Total Score (max 30 points ) 30   PHQ2-9    Flowsheet Row Clinical Support from 03/13/2024 in [facility] from 11/06/2023 in [facility] from 05/09/2023 in [facility] from 03/28/2023 in [facility] Clinical Support from 03/06/2023 in [facility]  PHQ-2 Total Score 0 0 0 4 0  PHQ-9 Total Score 0 8 9 15  0   Flowsheet Row ED from 10/10/2023 in [facility] Emergency Department at [facility] ED from 03/22/2023 in [facility]  Emergency Department at [facility] ED from 12/10/2020 in [facility] Emergency Department at [facility]  C-SSRS RISK CATEGORY No Risk No Risk No Risk     Assessment and Plan:  KATRINKA HERBISON is a 73 y.o. year old female with a history of depression, anxiety, parkinson, seronegative, Nonerosive RA in remission,  hypertension, vitamin B 12/D deficiency, gastroparesis, who presents for the follow-up appointment for below.    1. MDD (major depressive disorder), recurrent, in partial remission (HCC) 2. Anxiety disorder, unspecified type The patient reports dizziness, xerostomia, indigestion, Parkinsonian symptoms, and a fear of falling. Her mother has a history of bipolar disorder. Psychologically, she demonstrates a longstanding pattern of people-pleasing behaviors and struggles with setting boundaries. Socially, she has adopted her daughter. Developmentally, she grew up with a very strict father who tended to yell. As the oldest of her five siblings, she took on a caregiving role for her younger siblings  History: suffers from depression for many years, Originally on duloxetine  30 mg daily, lexapro  20 mg  daily. Relapse in mood symptoms when tapering off lexapro   There has been steady improvement in depressive symptoms, anxiety. She also reports improvement in dizziness and has been feeling more comfortable going for walks inside the gym. She denies any significant concern about the choking sensation while eating as well.  Will continue current medication regimen of taking both duloxetine  and Lexapro  to target depression and anxiety.  Although it is preferable to avoid polypharmacy of taking both SSRI and SNRI, we will plan to prioritize her functioning as she has sense of anxiety of tapering off the medication, and no obvious adverse reaction from the current medication regimen.. Previously discussed the risk of serotonin syndrome, QTc prolongation.  3. Chronic fatigue 4. Screening for iron  deficiency anemia Unchanged. She continues to experience fatigue, which could be multifactorial.  Given she is at risk of iron  deficiency given limited p.o. intake, gastroparesis, she was advised again to obtain lab to rule out iron  deficiency.    Plan Continue duloxetine  20 mg daily - reduced 07/2023 Continue lexapro  20 mg daily (EKG HR 72, QTc 448 msec, 01/2024) Obtain labs- ferritin at [facility] Next appointment: 10/3 at 10 30, video She sees a therapist, Tobias Gruber-  [facility], every two weeks   The patient demonstrates the following risk factors for suicide: Chronic risk factors for suicide include: psychiatric disorder of depression, anxiety . Acute risk factors for suicide include: loss (financial, interpersonal, professional). Protective factors for this patient include: positive social support, responsibility to others (children, family), coping skills, and hope for the future. Considering these factors, the overall suicide risk at this point appears to be low. Patient is appropriate for outpatient follow up.     Collaboration of Care: Collaboration of Care: Other reviewed notes in  Epic  Patient/Guardian was advised Release of Information must be obtained prior to any record release in order to collaborate their care with an outside provider. Patient/Guardian was advised if they have not already done so to contact the registration department to sign all necessary forms in order for us  to release information regarding their care.   Consent: Patient/Guardian gives verbal consent for treatment and assignment of benefits for services provided during this visit. Patient/Guardian expressed understanding and agreed to proceed.    [name], MD 04/11/2024, 12:09 PM

## 2024-04-11 ENCOUNTER — Encounter: Payer: Self-pay | Admitting: Psychiatry

## 2024-04-11 ENCOUNTER — Telehealth: Admitting: Psychiatry

## 2024-04-11 DIAGNOSIS — F3341 Major depressive disorder, recurrent, in partial remission: Secondary | ICD-10-CM

## 2024-04-11 DIAGNOSIS — F419 Anxiety disorder, unspecified: Secondary | ICD-10-CM

## 2024-04-11 MED ORDER — DULOXETINE HCL 20 MG PO CPEP: 20.0000 mg | ORAL_CAPSULE | Freq: Every day | ORAL | 0 refills | Status: DC

## 2024-04-11 NOTE — Patient Instructions (Signed)
 Continue duloxetine  20 mg daily  Continue lexapro  20 mg daily  Obtain labs- ferritin at [facility] Next appointment: 10/3 at 10 30

## 2024-04-14 ENCOUNTER — Ambulatory Visit: Payer: Self-pay

## 2024-04-14 ENCOUNTER — Ambulatory Visit (INDEPENDENT_AMBULATORY_CARE_PROVIDER_SITE_OTHER): Admitting: Physician Assistant

## 2024-04-14 VITALS — BP 120/76 | HR 70 | Temp 97.0°F | Ht 64.0 in | Wt 232.4 lb

## 2024-04-14 DIAGNOSIS — R300 Dysuria: Secondary | ICD-10-CM

## 2024-04-14 DIAGNOSIS — R350 Frequency of micturition: Secondary | ICD-10-CM

## 2024-04-14 LAB — POC URINALSYSI DIPSTICK (AUTOMATED)
Bilirubin, UA: NEGATIVE
Blood, UA: NEGATIVE
Glucose, UA: NEGATIVE
Ketones, UA: POSITIVE
Nitrite, UA: NEGATIVE
Protein, UA: NEGATIVE
Spec Grav, UA: 1.025 (ref 1.010–1.025)
Urobilinogen, UA: 0.2 U/dL
pH, UA: 5.5 (ref 5.0–8.0)

## 2024-04-14 MED ORDER — CEPHALEXIN 500 MG PO CAPS: 500.0000 mg | ORAL_CAPSULE | Freq: Three times a day (TID) | ORAL | 0 refills | Status: AC

## 2024-04-14 NOTE — Telephone Encounter (Signed)
 Noted. Patient seeing Alyssa Allwardt 04/14/2024

## 2024-04-14 NOTE — Progress Notes (Signed)
 Patient ID: Kristin [name], female    DOB: 1951/08/22, 73 y.o.   MRN: [id number]   Assessment & Plan:  Urinary frequency -     POCT Urinalysis Dipstick (Automated) -     Urine Culture  Dysuria -     Urine Culture    Assessment & Plan Urinary tract infection Urinary frequency, urgency, and dysuria for the past week. Urine test shows dark yellow, cloudy urine with ketones and trace bacteria. No fever, chills, or back pain. Recurrent UTIs, previously treated with Keflex  in 2021 without adverse reaction. Macrobid not preferred due to age-related side effects such as dizziness. - Send urine sample for culture to identify bacteria. - Prescribe Keflex  (cefalexin) three times a day for seven days - Discuss potential for low-dose daily antibiotic if recurrent UTIs occur. - Advise on cranberry tablets or juice as preventive measures, considering sugar content.     F/up prn    Subjective:    Chief Complaint  Patient presents with   Urinary Tract Infection    T in office c/o urinary frequency over 1 week, pt admits to also urgency; but not a lot of output; pt also states had some shaking when going to the restroom; pt admits to pain and burning after urinating;     Urinary Tract Infection    Discussed the use of AI scribe software for clinical note transcription with the patient, who gave verbal consent to proceed.  History of Present Illness Kristin [name] is a 73 year old female with recurrent urinary tract infections who presents with urinary symptoms suggestive of a UTI.  She has been experiencing urinary frequency and urgency for the past week, with minimal urinary output, accompanied by pain and burning sensations during urination.  The symptoms were initially worse but have persisted. She attempted to manage the symptoms with AZO, hoping she would resolve without medical intervention, but the symptoms have continued.  No fevers, chills, back pain, or kidney pain. She  notes a history of similar urinary issues in the past, sometimes accompanied by fever, but states she has not had such problems for a long time until now.  She has been working with a pelvic floor physical therapist for urinary issues related to Parkinson's disease, but she notes that this current episode is different due to the presence of pain during urination.  Her medication history includes a past reaction to penicillin and Bactrim, but she has previously tolerated Keflex  without adverse effects. She recalls a significant reaction to prednisone  in the past.     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   Hyperlipidemia    Hypertension    Infertility, female    Low back pain 10/17/2007   Qualifier: Diagnosis of  By: Mavis MD, Norleen BRAVO    Measles as a child  Obesity    Osteoarthritis    Parkinson disease (HCC)    Plantar fasciitis of left foot 06/19/2012   Preventative health care 12/19/2015   Rheumatoid arthritis (HCC) 01/08/2018   Rosacea 10/05/2014   Swallowing difficulty    Urinary frequency 02/25/2013   Visual floaters 05/11/2014    Past Surgical History:  Procedure Laterality Date   ABDOMINAL HYSTERECTOMY  2006   total   esophageal     stretching   HERNIA REPAIR N/A 2022   hiatal hernia   laporoscopy     LEFT HEART CATH AND CORONARY ANGIOGRAPHY N/A 06/02/2019   Procedure: LEFT HEART CATH AND CORONARY  ANGIOGRAPHY;  Surgeon: Dann Candyce RAMAN, MD;  Location: [facility] INVASIVE CV LAB;  Service: Cardiovascular;  Laterality: N/A;   TONSILLECTOMY     TOTAL HIP ARTHROPLASTY Right 2010   UPPER GASTROINTESTINAL ENDOSCOPY     wisdom teeth extracted      Family History  Problem Relation Age of Onset   Other Mother        arrythmia   Mental illness Mother        bipolar   Hyperlipidemia Mother    Thyroid  disease Mother    Depression Mother    Bipolar disorder Mother    Breast cancer Mother 49   Heart disease Father    Arthritis Father        rheumatoid   Hypertension Father    Hyperlipidemia Father    Depression Sister    Mental illness Sister        bipolar   Parkinson's disease Sister    Arthritis Sister    Arthritis Sister    Arthritis Sister    Breast cancer Maternal Aunt 35   Breast cancer Maternal Uncle        59s   Heart attack Paternal Uncle    Heart attack Paternal Grandfather    Breast cancer Cousin 59   Colon cancer Neg Hx    Esophageal cancer Neg Hx    Rectal cancer Neg Hx    Stomach cancer Neg Hx    Sleep apnea Neg Hx     Social History   Tobacco Use   Smoking status: Never   Smokeless tobacco: Never  Vaping Use   Vaping status: Never Used  Substance Use Topics   Alcohol use: Yes    Alcohol/week: 1.0 standard drink of alcohol    Types: 1 Glasses of wine per week    Comment: occasional wine   Drug use: No     Allergies  Allergen Reactions   Scopolamine Other (See Comments)    Panic hallu  Other Reaction(s): Hallucination, Not available   Prednisone  Dermatitis    Facial ulcers  Other Reaction(s): facial swelling   Bactrim [Sulfamethoxazole-Trimethoprim] Other (See Comments)    Oral ulcers and rash   Penicillins Hives and Other (See Comments)    Did it involve swelling of the face/tongue/throat, SOB, or low BP? No  Did it involve sudden or severe rash/hives, skin peeling, or any reaction on the inside of your mouth or nose? No  Did you need to  seek medical attention at a hospital or doctor's office? No  When did it last happen?      30+ years ago  If all above answers are "NO", may proceed with cephalosporin use.   Sulfa Antibiotics     Other Reaction(s): Not available    Review of Systems NEGATIVE UNLESS OTHERWISE INDICATED IN HPI  Objective:     BP 120/76 (BP Location: Left Arm, Patient Position: Sitting, Cuff Size: Normal)   Pulse 70   Temp (!) 97 F (36.1 C) (Temporal)   Ht 5' 4 (1.626 m)   Wt 232 lb 6.4 oz (105.4 kg)   SpO2 98%   BMI 39.89 kg/m   Wt Readings from Last 3 Encounters:  04/14/24 232 lb 6.4 oz (105.4 kg)  03/19/24 235 lb 12.8 oz (107 kg)  03/13/24 234 lb (106.1 kg)    BP Readings from Last 3 Encounters:  04/14/24 120/76  03/19/24 122/84  02/19/24 130/82     Physical Exam Vitals and nursing note reviewed.  Constitutional:      General: She is not in acute distress.    Appearance: Normal appearance. She is not ill-appearing.     Comments: Using a cane  HENT:     Head: Normocephalic and atraumatic.  Cardiovascular:     Rate and Rhythm: Normal rate and regular rhythm.     Pulses: Normal pulses.     Heart sounds: Normal heart sounds.  Pulmonary:     Effort: Pulmonary effort is normal.     Breath sounds: Normal breath sounds.  Skin:    General: Skin is warm and dry.  Neurological:     General: No focal deficit present.     Mental Status: She is alert.  Psychiatric:        Mood and Affect: Mood normal.             Milah Recht M Soley Harriss, PA-C

## 2024-04-14 NOTE — Telephone Encounter (Signed)
 FYI Only or Action Required?: FYI only for provider.  Patient was last seen in primary care on 01/03/2024 by Katrinka Garnette KIDD, MD.  Called Nurse Triage reporting Urinary Frequency.  Symptoms began a week ago.  Interventions attempted: OTC medications: Azo.  Symptoms are: gradually worsening.  Triage Disposition: See Physician Within 24 Hours  Patient/caregiver understands and will follow disposition?: Yes     Copied from CRM #[id number]. Topic: Clinical - Red Word Triage >> Apr 14, 2024  9:52 AM Deleta RAMAN wrote: Red Word that prompted transfer to Nurse Triage: patient believe to have a uti she mention very painful and worsening over time. Has urgency going to restroom. Reason for Disposition  Urinating more frequently than usual (i.e., frequency) OR new-onset of the feeling of an urgent need to urinate (i.e., urgency)  Answer Assessment - Initial Assessment Questions Patient denies fever, back pain, blood in urine  1. SYMPTOM: What's the main symptom you're concerned about? (e.g., frequency, incontinence)     Increased urgency, pain with urination 2. ONSET: When did the  symptoms  start?     A week 3. PAIN: Is there any pain? If Yes, ask: How bad is it? (Scale: 1-10; mild, moderate, severe)     6 4. CAUSE: What do you think is causing the symptoms?     UTI - patient states she has had these symptoms before 5. OTHER SYMPTOMS: Do you have any other symptoms? (e.g., blood in urine, fever, flank pain, pain with urination)     No other symptoms  Protocols used: Urinary Symptoms-A-AH

## 2024-04-15 DIAGNOSIS — Z6841 Body Mass Index (BMI) 40.0 and over, adult: Secondary | ICD-10-CM | POA: Diagnosis not present

## 2024-04-15 DIAGNOSIS — Z01419 Encounter for gynecological examination (general) (routine) without abnormal findings: Secondary | ICD-10-CM | POA: Diagnosis not present

## 2024-04-24 DIAGNOSIS — K3184 Gastroparesis: Secondary | ICD-10-CM | POA: Diagnosis not present

## 2024-04-24 DIAGNOSIS — E66813 Obesity, class 3: Secondary | ICD-10-CM | POA: Diagnosis not present

## 2024-04-24 DIAGNOSIS — R7303 Prediabetes: Secondary | ICD-10-CM | POA: Diagnosis not present

## 2024-04-24 DIAGNOSIS — Z6841 Body Mass Index (BMI) 40.0 and over, adult: Secondary | ICD-10-CM | POA: Diagnosis not present

## 2024-04-24 DIAGNOSIS — I10 Essential (primary) hypertension: Secondary | ICD-10-CM | POA: Diagnosis not present

## 2024-04-24 DIAGNOSIS — E65 Localized adiposity: Secondary | ICD-10-CM | POA: Diagnosis not present

## 2024-05-06 DIAGNOSIS — M06371 Rheumatoid nodule, right ankle and foot: Secondary | ICD-10-CM | POA: Diagnosis not present

## 2024-05-06 DIAGNOSIS — E559 Vitamin D deficiency, unspecified: Secondary | ICD-10-CM | POA: Diagnosis not present

## 2024-05-06 DIAGNOSIS — N958 Other specified menopausal and perimenopausal disorders: Secondary | ICD-10-CM | POA: Diagnosis not present

## 2024-05-06 DIAGNOSIS — M06372 Rheumatoid nodule, left ankle and foot: Secondary | ICD-10-CM | POA: Diagnosis not present

## 2024-05-10 ENCOUNTER — Other Ambulatory Visit: Payer: Self-pay | Admitting: Neurology

## 2024-05-10 DIAGNOSIS — G20A1 Parkinson's disease without dyskinesia, without mention of fluctuations: Secondary | ICD-10-CM

## 2024-05-13 ENCOUNTER — Encounter: Payer: Self-pay | Admitting: Family Medicine

## 2024-05-13 ENCOUNTER — Ambulatory Visit (INDEPENDENT_AMBULATORY_CARE_PROVIDER_SITE_OTHER): Admitting: Family Medicine

## 2024-05-13 VITALS — BP 132/72 | HR 72 | Temp 97.4°F | Ht 64.0 in | Wt 234.4 lb

## 2024-05-13 DIAGNOSIS — I2510 Atherosclerotic heart disease of native coronary artery without angina pectoris: Secondary | ICD-10-CM

## 2024-05-13 DIAGNOSIS — G20B2 Parkinson's disease with dyskinesia, with fluctuations: Secondary | ICD-10-CM

## 2024-05-13 DIAGNOSIS — M069 Rheumatoid arthritis, unspecified: Secondary | ICD-10-CM

## 2024-05-13 DIAGNOSIS — R7303 Prediabetes: Secondary | ICD-10-CM | POA: Diagnosis not present

## 2024-05-13 DIAGNOSIS — I10 Essential (primary) hypertension: Secondary | ICD-10-CM

## 2024-05-13 DIAGNOSIS — E782 Mixed hyperlipidemia: Secondary | ICD-10-CM

## 2024-05-13 MED ORDER — COVID-19 MRNA VACC (MODERNA) 50 MCG/0.5ML IM SUSP: 0.5000 mL | Freq: Once | INTRAMUSCULAR | 0 refills | Status: AC

## 2024-05-13 NOTE — Patient Instructions (Addendum)
 Thrilled you are doing so well! Happy belated birthday! Just had labs in may so holding off today  Recommended follow up: Return in about 6 months (around 11/10/2024) for followup or sooner if needed.Schedule b4 you leave.

## 2024-05-13 NOTE — Progress Notes (Signed)
 Phone [phone] In person visit   Subjective:   Kristin [name] is a 73 y.o. year old very pleasant female patient who presents for/with See problem oriented charting Chief Complaint  Patient presents with   Hypertension    Discussed the use of AI scribe software for clinical note transcription with the patient, who gave verbal consent to proceed.  History of Present Illness   Kristin [name] is a 73 year old female with Parkinson's disease who presents for routine follow-up.  She is on a regimen of carbidopa -levodopa , both sustained and instant release, as well as entacapone  to prevent medication wearing off. She also takes Mirapex  0.5 mg for increased OCD tendencies, which she finds helpful. She participates in balance classes and strength exercises, which have been beneficial. She experiences difficulty walking straight at times but notes improvement with these activities.  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 Her last rheumatology visit was on December 17, 2023, where remission was noted with [facility].   Her hypertension is managed with carvedilol  3.125 mg twice daily, and her blood pressure remains controlled at 132/72. She previously discontinued losartan  25 mg. She experienced dizziness for a couple of months, which has since resolved.  She has non-obstructive coronary artery disease with minimal plaque noted on catheterization in 2020. She is on rosuvastatin  10 mg three days a week, with her most recent LDL at 61. No angina or significant symptoms are reported.  She is treated for depression and anxiety with escitalopram  20 mg and Cymbalta  20 mg. Her psychiatrist has recommended no further down titration at this time. She has a history of alprazolam  use but is no longer taking it- has on hand if severe episode  For acid reflux, she takes omeprazole  20 mg, which she finds effective. She  also takes vitamin D  50,000 units weekly and vitamin B12 every other day for a deficiency, with recent levels showing improvement.  She has prediabetes with a last A1c of 6.2, and she is working with a wellness team to manage her condition. She reports a weight loss of 10 pounds but is unsure of the measurement accuracy.  She experiences occasional foot swelling, which she manages with compression stockings.   For overactive bladder, she takes solifenacin (Vesicare) 5 mg daily, which she finds beneficial despite potential risks with Parkinson's.  Socially, she is active in activities such as hosting a book club and recently celebrated her birthday with family. She is planning a cruise to [place].       Past Medical History-  Patient Active Problem List   Diagnosis Date Noted   Coronary artery disease involving native coronary artery of native heart without angina pectoris 06/12/2019    Priority: High   Rheumatoid arthritis (HCC) 01/08/2018    Priority: High   Constipation 12/07/2015    Priority: High   Parkinson's disease (HCC) 05/11/2014    Priority: High   Vitamin B 12 deficiency 05/30/2021    Priority: Medium    Vitamin D  deficiency 12/20/2016    Priority: Medium    Prediabetes 12/20/2016    Priority: Medium    Shortness of breath on exertion 11/27/2016    Priority: Medium    Morbid obesity (HCC)     Priority: Medium    Depression with anxiety 11/02/2009    Priority: Medium    Essential hypertension 07/05/2007    Priority: Medium    Osteoarthritis 07/05/2007  Priority: Medium    Hyperlipidemia 06/26/2007    Priority: Medium    H/O: iron  deficiency anemia 05/08/2007    Priority: Medium    Hiatal hernia with GERD 05/08/2007    Priority: Medium    Anemia 03/16/2020    Priority: Low   Diastolic dysfunction 05/16/2019    Priority: Low   BCC (basal cell carcinoma of skin) 10/05/2014    Priority: Low   Rosacea 10/05/2014    Priority: Low   Screening for cervical  cancer 07/17/2012    Priority: Low   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   Foot pain, bilateral 06/19/2012    Priority: 1.   Lumbar back pain with radiculopathy affecting lower extremity 10/17/2007    Priority: 1.    Medications- reviewed and updated Current Outpatient Medications  Medication Sig Dispense Refill   carbidopa -levodopa  (SINEMET  CR) 50-200 MG tablet TAKE 1 TABLET BY MOUTH EVERYDAY AT BEDTIME 90 tablet 0   carbidopa -levodopa  (SINEMET  IR) 25-100 MG tablet TAKE 2 TABLETS BY MOUTH AT 7AM, 2 TABLETS AT 11AM, AND 1 TABLET AT 4PM 450 tablet 0   carvedilol  (COREG ) 3.125 MG tablet TAKE 1 TABLET BY MOUTH TWICE A DAY WITH FOOD 180 tablet 3   COVID-19 mRNA vaccine, Moderna, >/= 8yrs, (SPIKEVAX) injection Inject 0.5 mLs into the muscle once for 1 dose. 0.5 mL 0   [START ON 05/20/2024] DULoxetine  (CYMBALTA ) 20 MG capsule Take 1 capsule (20 mg total) by mouth daily. 90 capsule 0   entacapone  (COMTAN ) 200 MG tablet Take 1 tablet (200 mg total) by mouth 3 (three) times daily. 270 tablet 2   escitalopram  (LEXAPRO ) 20 MG tablet TAKE 1 TABLET BY MOUTH EVERY DAY 90 tablet 3   omeprazole  (PRILOSEC) 20 MG capsule TAKE 1 CAPSULE BY MOUTH 2 TIMES DAILY AS NEEDED. 180 capsule 1   polyethylene glycol (MIRALAX  / GLYCOLAX ) 17 g packet Take 17 g by mouth daily as needed for mild constipation.     pramipexole  (MIRAPEX ) 0.5 MG tablet Take 1 tablet (0.5 mg total) by mouth 3 (three) times daily. 270 tablet 2   rosuvastatin  (CRESTOR ) 10 MG tablet TAKE 1 TABLET BY MOUTH ON MONDAYS,WEDNESDAYS, AND FRIDAYS 39 tablet 3   solifenacin (VESICARE) 5 MG tablet Take 5 mg by mouth daily.     Vitamin D , Ergocalciferol , (DRISDOL ) 1.25 MG (50000 UNIT) CAPS capsule TAKE 1 CAPSULE (50,000 UNITS TOTAL) BY MOUTH EVERY 7 (SEVEN) DAYS 12 capsule 1   No current facility-administered medications for this visit.      Objective:  BP 132/72 (BP Location: Left Arm, Patient Position: Sitting, Cuff Size: Normal)   Pulse 72   Temp (!) 97.4 F (36.3 C) (Temporal)   Ht 5' 4 (1.626 m)   Wt 234 lb 6.4 oz (106.3 kg)   SpO2 93%   BMI 40.23 kg/m  Gen: NAD, resting comfortably CV: RRR no murmurs rubs or gallops Lungs: CTAB no crackles, wheeze, rhonchi Ext: 1+ edema Skin: warm, dry Neuro: walks with cane    Assessment and Plan      #Parkinson's disease is managed with carbidopa /levodopa  (both sustained and instant release) and entacapone  to prevent wearing off of medication. Mirapex  0.5 mg is used to manage OCD tendencies associated with Parkinson's. - Continue carbidopa /levodopa  sustained and instant release. - Continue entacapone . - Continue Mirapex  0.5 mg. - Follow up with Dr. Evonnie  regularly.  #Nonobstructive coronary artery disease with minimal plaque on catheterization in 2020. LDL is well controlled at 61, below the target of 70, to prevent progression. Carvedilol  is also used for heart protection. - Continue rosuvastatin  10 mg three days a week. - Monitor LDL levels to ensure she remains below 70. not yet due - Continue carvedilol  3.125 mg twice daily.  #Mixed hyperlipidemia is being managed with rosuvastatin , achieving an LDL of 61, which is excellent. - Continue rosuvastatin  10 mg three days a week.  #Hypertension is well controlled with carvedilol  3.125 mg twice daily. Losartan  was discontinued, and blood pressure remains at 132/72. - Continue carvedilol  3.125 mg twice daily. - Monitor blood pressure regularly.  #Rheumatoid arthritis is seronegative and currently in remission without the use of immunosuppressives or methotrexate since August 2023.  #anxiety/Depression is managed with escitalopram  20 mg and Cymbalta  20 mg. She is functioning well on this regimen, and no further titration is recommended. - Continue escitalopram  20 mg daily. - Continue Cymbalta  20 mg  daily.  #Overactive bladder is managed with solifenacin (Vesicare) 5 mg daily, which has been effective. There is a risk of worsening constipation, especially with Parkinson's, but she prefers to remain on this medication. - Continue solifenacin (Vesicare) 5 mg daily.  #Gastroesophageal reflux disease is managed with omeprazole  20 mg, which is effective. - Continue omeprazole  20 mg daily.  #Gastroparesis is an ongoing issue. GLP-1 agonists are avoided due to the risk of exacerbating gastroparesis. - Avoid GLP-1 agonists.  #Peripheral edema is mild, with occasional swelling of the feet. Compression stockings are recommended but may be difficult to put on. - Consider using compression stockings during the day.  #Vitamin B12 deficiency is being managed with supplementation, and levels have improved to 834 from 241. - Continue vitamin B12 supplementation every other day.  #Prediabetes with a recent A1c of 6.2. She is working with a wellness team to improve diet and increase activity levels. - Monitor A1c levels periodically. - Encourage lifestyle modifications including diet and exercise.  #General Health Maintenance COVID-19 vaccination is recommended, and a prescription has been sent to [facility]. Bone density was updated in 2025, and previous results were excellent. Vitamin D  levels were reassuring in May. - Receive COVID-19 vaccination at [facility]. - Monitor bone density results. - Continue vitamin D  50,000 units once a week.     - flu shot planned later date at pharmacy   Recommended follow up: Return in about 6 months (around 11/10/2024) for followup or sooner if needed.Schedule b4 you leave. Future Appointments  Date Time Provider Department Center  06/06/2024 10:30 AM Vickey Mettle, MD [facility] None  09/11/2024 11:45 AM Jacelyn Lupita NOVAK, CCC-SLP [facility]  09/11/2024 12:00 PM Starlet Greig ORN, PT [facility]  09/11/2024 12:15 PM Hoxie, Lauraine HERO, OT [facility]  09/25/2024  8:15 AM Tat,  Asberry RAMAN, DO [facility] None  03/19/2025 10:00 AM [facility] ANNUAL WELLNESS VISIT 1 [facility] Milian    Lab/Order associations:   ICD-10-CM   1. Rheumatoid arthritis, involving unspecified site, unspecified whether rheumatoid factor present (HCC)  M06.9     2. Coronary artery disease involving native coronary artery of native heart without angina pectoris  I25.10     3. Parkinson's disease with dyskinesia and fluctuating manifestations (HCC)  G20.B2     4. Prediabetes  R73.03     5. Mixed hyperlipidemia  E78.2     6. Essential hypertension  I10       Meds ordered this encounter  Medications   COVID-19 mRNA vaccine, Moderna, >/= 35yrs, (SPIKEVAX) injection    Sig: Inject 0.5 mLs into the muscle once for 1 dose.    Dispense:  0.5 mL    Refill:  0    Return precautions advised.  Garnette Lukes, MD

## 2024-05-20 DIAGNOSIS — Z23 Encounter for immunization: Secondary | ICD-10-CM | POA: Diagnosis not present

## 2024-05-20 DIAGNOSIS — R350 Frequency of micturition: Secondary | ICD-10-CM | POA: Diagnosis not present

## 2024-05-20 DIAGNOSIS — R3915 Urgency of urination: Secondary | ICD-10-CM | POA: Diagnosis not present

## 2024-06-01 NOTE — Progress Notes (Deleted)
 [facility] MD/PA/NP OP Progress Note  06/01/2024 3:02 PM Kristin [name]  MRN:  [id number]  Chief Complaint: No chief complaint on file.  HPI: *** Visit Diagnosis: No diagnosis found.  Past Psychiatric History: Please see initial evaluation for full details. I have reviewed the history. No updates at this time.     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   Hyperlipidemia    Hypertension    Infertility, female    Low back pain 10/17/2007   Qualifier: Diagnosis of  By: Mavis MD, Norleen BRAVO    Measles as a child   Obesity    Osteoarthritis    Parkinson disease (HCC)    Plantar fasciitis of left foot 06/19/2012   Preventative health care 12/19/2015   Rheumatoid arthritis (HCC) 01/08/2018   Rosacea 10/05/2014   Swallowing difficulty    Urinary frequency 02/25/2013   Visual floaters 05/11/2014    Past Surgical History:  Procedure Laterality Date   ABDOMINAL HYSTERECTOMY  2006   total   CARDIAC CATHETERIZATION  October 2021   esophageal     stretching   HERNIA REPAIR N/A 2022   hiatal hernia   JOINT REPLACEMENT  05 2010   laporoscopy     LEFT HEART CATH AND CORONARY ANGIOGRAPHY N/A 06/02/2019   Procedure: LEFT HEART CATH AND CORONARY ANGIOGRAPHY;  Surgeon: Dann Candyce RAMAN, MD;  Location: [facility];  Service: Cardiovascular;  Laterality: N/A;   TONSILLECTOMY     TOTAL HIP  ARTHROPLASTY Right 2010   UPPER GASTROINTESTINAL ENDOSCOPY     wisdom teeth extracted      Family Psychiatric History: Please see initial evaluation for full details. I have reviewed the history. No updates at this time.     Family History:  Family History  Problem Relation Age of Onset   Other Mother        arrythmia   Mental illness Mother        bipolar   Hyperlipidemia Mother    Thyroid  disease Mother    Depression Mother    Bipolar disorder Mother    Breast cancer Mother 17   Heart disease Father    Arthritis Father        rheumatoid   Hypertension Father    Hyperlipidemia Father    Depression Sister    Mental illness Sister        bipolar   Parkinson's disease Sister    Arthritis Sister    Arthritis Sister    Arthritis Sister    Breast cancer Maternal Aunt 35   Breast cancer Maternal Uncle        45s   Heart attack Paternal Uncle    Heart attack Paternal Grandfather    Breast cancer Cousin 10   Colon cancer  Neg Hx    Esophageal cancer Neg Hx    Rectal cancer Neg Hx    Stomach cancer Neg Hx    Sleep apnea Neg Hx     Social History:  Social History   Socioeconomic History   Marital status: Married    Spouse name: Not on file   Number of children: 1   Years of education: Not on file   Highest education level: Master's degree (e.g., MA, MS, MEng, MEd, MSW, MBA)  Occupational History   Occupation: Retired International aid/development worker  Tobacco Use   Smoking status: Never   Smokeless tobacco: Never  Vaping Use   Vaping status: Never Used  Substance and Sexual Activity   Alcohol use: Yes    Alcohol/week: 1.0 standard drink of alcohol    Types: 1 Glasses of wine per week    Comment: occasional wine   Drug use: No   Sexual activity: Not Currently    Partners: Male  Other Topics Concern   Not on file  Social History Narrative   Lives with husband, 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 3 grandsons- 2 in [place] and 25 year old in [place] in 2023      Hobbies: book club, time with friends, time with couples friends, plays pianos   Social Drivers of Corporate investment banker Strain: Low Risk  (03/10/2024)   Overall Financial Resource Strain (CARDIA)    Difficulty of Paying Living Expenses: Not hard at all  Food Insecurity: No Food Insecurity (03/10/2024)   Hunger Vital Sign    Worried About Running Out of Food in the Last Year: Never true    Ran Out of Food in the Last Year: Never true  Transportation Needs: No Transportation Needs (03/10/2024)   PRAPARE - Administrator, Civil Service (Medical): No    Lack of Transportation (Non-Medical): No  Physical Activity: Sufficiently Active (03/10/2024)   Exercise Vital Sign    Days of Exercise per Week: 5 days    Minutes of Exercise per Session: 60 min  Stress: No Stress Concern Present (03/10/2024)   [employer] of Occupational Health - Occupational Stress Questionnaire    Feeling of Stress: Only a little  Social Connections: Socially Integrated (03/10/2024)   Social Connection and Isolation Panel    Frequency of Communication with Friends and Family: More than three times a week    Frequency of Social Gatherings with Friends and Family: Three times a week    Attends Religious Services: 1 to 4 times per year    Active Member of Clubs or Organizations: Yes    Attends Banker Meetings: More than 4 times per year    Marital Status: Married    Allergies:  Allergies  Allergen Reactions   Scopolamine Other (See Comments)    Panic hallu  Other Reaction(s): Hallucination, Not available   Prednisone  Dermatitis    Facial ulcers  Other Reaction(s): facial swelling   Bactrim [Sulfamethoxazole-Trimethoprim] Other (See Comments)    Oral ulcers and rash   Penicillins Hives and Other (See Comments)    Did it involve swelling of the face/tongue/throat, SOB, or low BP? No  Did it involve  sudden or severe rash/hives, skin peeling, or any reaction on the inside of your mouth or nose? No  Did you need to seek medical attention at a hospital or doctor's office? No  When did it last happen?      30+ years ago  If all above answers are "NO", may proceed with cephalosporin use.   Sulfa Antibiotics     Other Reaction(s): Not available    Metabolic Disorder Labs: Lab Results  Component Value Date   HGBA1C 6.2 01/03/2024   MPG 126 (H) 12/29/2013   No results found for: PROLACTIN Lab Results  Component Value Date   CHOL 133 01/03/2024   TRIG 88.0 01/03/2024   HDL 54.20 01/03/2024   CHOLHDL 2 01/03/2024   VLDL 17.6 01/03/2024   LDLCALC 61 01/03/2024   LDLCALC 60 03/28/2023   Lab Results  Component Value Date   TSH 1.10 01/12/2022   TSH 1.20 10/03/2021    Therapeutic Level Labs: No results found for: LITHIUM No results found for: VALPROATE No results found for: CBMZ  Current Medications: Current Outpatient Medications  Medication Sig Dispense Refill   carbidopa -levodopa  (SINEMET  CR) 50-200 MG tablet TAKE 1 TABLET BY MOUTH EVERYDAY AT BEDTIME 90 tablet 0   carbidopa -levodopa  (SINEMET  IR) 25-100 MG tablet TAKE 2 TABLETS BY MOUTH AT 7AM, 2 TABLETS AT 11AM, AND 1 TABLET AT 4PM 450 tablet 0   carvedilol  (COREG ) 3.125 MG tablet TAKE 1 TABLET BY MOUTH TWICE A DAY WITH FOOD 180 tablet 3   DULoxetine  (CYMBALTA ) 20 MG capsule Take 1 capsule (20 mg total) by mouth daily. 90 capsule 0   entacapone  (COMTAN ) 200 MG tablet Take 1 tablet (200 mg total) by mouth 3 (three) times daily. 270 tablet 2   escitalopram  (LEXAPRO ) 20 MG tablet TAKE 1 TABLET BY MOUTH EVERY DAY 90 tablet 3   omeprazole  (PRILOSEC) 20 MG capsule TAKE 1 CAPSULE BY MOUTH 2 TIMES DAILY AS NEEDED. 180 capsule 1   polyethylene glycol (MIRALAX  / GLYCOLAX ) 17 g packet Take 17 g by mouth daily as needed for mild constipation.     pramipexole  (MIRAPEX ) 0.5 MG tablet Take 1 tablet (0.5 mg total) by mouth 3  (three) times daily. 270 tablet 2   rosuvastatin  (CRESTOR ) 10 MG tablet TAKE 1 TABLET BY MOUTH ON MONDAYS,WEDNESDAYS, AND FRIDAYS 39 tablet 3   solifenacin (VESICARE) 5 MG tablet Take 5 mg by mouth daily.     Vitamin D , Ergocalciferol , (DRISDOL ) 1.25 MG (50000 UNIT) CAPS capsule TAKE 1 CAPSULE (50,000 UNITS TOTAL) BY MOUTH EVERY 7 (SEVEN) DAYS 12 capsule 1   No current facility-administered medications for this visit.     Musculoskeletal: Strength & Muscle Tone: N/A Gait & Station: N/A Patient leans: N/A  Psychiatric Specialty Exam: Review of Systems  There were no vitals taken for this visit.There is no height or weight on file to calculate BMI.  General Appearance: {Appearance:22683}  Eye Contact:  {BHH EYE CONTACT:22684}  Speech:  Clear and Coherent  Volume:  Normal  Mood:  {BHH MOOD:22306}  Affect:  {Affect (PAA):22687}  Thought Process:  Coherent  Orientation:  Full (Time, Place, and Person)  Thought Content: Logical   Suicidal Thoughts:  {ST/HT (PAA):22692}  Homicidal Thoughts:  {ST/HT (PAA):22692}  Memory:  Immediate;   Good  Judgement:  {Judgement (PAA):22694}  Insight:  {Insight (PAA):22695}  Psychomotor Activity:  Normal  Concentration:  Concentration: Good and Attention Span: Good  Recall:  Good  Fund of Knowledge: Good  Language: Good  Akathisia:  No  Handed:  Right  AIMS (if indicated): not done  Assets:  Communication Skills Desire for Improvement  ADL's:  Intact  Cognition: WNL  Sleep:  {BHH GOOD/FAIR/POOR:22877}  Screenings: GAD-7    Flowsheet Row Office Visit from 05/13/2024 in [facility] [employer] Visit from 05/09/2023 in [facility] [employer] from 03/28/2023 in [facility] [employer] from 01/23/2023 in [facility] [place]  Total GAD-7 Score 0 5 6 0   Mini-Mental    Flowsheet Row Office Visit from 10/15/2017  in [facility]  Total Score (max 30 points ) 30   PHQ2-9    Flowsheet Row Office Visit from 05/13/2024 in [facility] [employer] [facility] [place] Clinical Support from 03/13/2024 in [facility] [place] [facility] [employer] Visit from 11/06/2023 in [facility] [employer] Visit from 05/09/2023 in [facility] [employer] Visit from 03/28/2023 in [facility] [place]  PHQ-2 Total Score 0 0 0 0 4  PHQ-9 Total Score 3 0 8 9 15    Flowsheet Row ED from 10/10/2023 in [facility] at [facility] ED from 03/22/2023 in [facility] at [facility] ED from 12/10/2020 in [facility] Department at [facility]  C-SSRS RISK CATEGORY No Risk No Risk No Risk     Assessment and Plan:  JEANA KERSTING is a 73 y.o. year old female with a history of depression, anxiety, parkinson, seronegative, Nonerosive RA in remission,  hypertension, vitamin B 12/D deficiency, gastroparesis, who presents for the follow-up appointment for below.    1. MDD (major depressive disorder), recurrent, in partial remission (HCC) 2. Anxiety disorder, unspecified type The patient reports dizziness, xerostomia, indigestion, Parkinsonian symptoms, and a fear of falling. Her mother has a history of bipolar disorder. Psychologically, she demonstrates a longstanding pattern of people-pleasing behaviors and struggles with setting boundaries. Socially, she has adopted her daughter. Developmentally, she grew up with a very strict father who tended to yell. As the oldest of her five siblings, she took on a caregiving role for her younger siblings  History: suffers from depression for many years, Originally on duloxetine  30 mg daily, lexapro  20 mg daily. Relapse in mood symptoms when tapering off lexapro   There has been steady  improvement in depressive symptoms, anxiety. She also reports improvement in dizziness and has been feeling more comfortable going for walks inside the gym. She denies any significant concern about the choking sensation while eating as well.  Will continue current medication regimen of taking both duloxetine  and Lexapro  to target depression and anxiety.  Although it is preferable to avoid polypharmacy of taking both SSRI and SNRI, we will plan to prioritize her functioning as she has sense of anxiety of tapering off the medication, and no obvious adverse reaction from the current medication regimen.. Previously discussed the risk of serotonin syndrome, QTc prolongation.   3. Chronic fatigue 4. Screening for iron  deficiency anemia Unchanged. She continues to experience fatigue, which could be multifactorial.  Given she is at risk of iron  deficiency given limited p.o. intake, gastroparesis, she was advised again to obtain lab to rule out iron  deficiency.    Plan Continue duloxetine  20 mg daily - reduced 07/2023 Continue lexapro  20 mg daily (EKG HR 72, QTc 448 msec, 01/2024) Obtain labs- ferritin at [facility] Next appointment: 10/3 at 10 30, video She sees a therapist, Tobias Gruber-  [facility],  every two weeks   The patient demonstrates the following risk factors for suicide: Chronic risk factors for suicide include: psychiatric disorder of depression, anxiety . Acute risk factors for suicide include: loss (financial, interpersonal, professional). Protective factors for this patient include: positive social support, responsibility to others (children, family), coping skills, and hope for the future. Considering these factors, the overall suicide risk at this point appears to be low. Patient is appropriate for outpatient follow up.   Collaboration of Care: Collaboration of Care: {[facility] OP Collaboration of Care:21014065}  Patient/Guardian was advised Release of Information must be obtained  prior to any record release in order to collaborate their care with an outside provider. Patient/Guardian was advised if they have not already done so to contact the registration department to sign all necessary forms in order for us  to release information regarding their care.   Consent: Patient/Guardian gives verbal consent for treatment and assignment of benefits for services provided during this visit. Patient/Guardian expressed understanding and agreed to proceed.    [name], MD 06/01/2024, 3:02 PM

## 2024-06-02 DIAGNOSIS — I10 Essential (primary) hypertension: Secondary | ICD-10-CM | POA: Diagnosis not present

## 2024-06-02 DIAGNOSIS — E65 Localized adiposity: Secondary | ICD-10-CM | POA: Diagnosis not present

## 2024-06-02 DIAGNOSIS — Z6841 Body Mass Index (BMI) 40.0 and over, adult: Secondary | ICD-10-CM | POA: Diagnosis not present

## 2024-06-02 DIAGNOSIS — R7303 Prediabetes: Secondary | ICD-10-CM | POA: Diagnosis not present

## 2024-06-02 DIAGNOSIS — E66813 Obesity, class 3: Secondary | ICD-10-CM | POA: Diagnosis not present

## 2024-06-02 DIAGNOSIS — K3184 Gastroparesis: Secondary | ICD-10-CM | POA: Diagnosis not present

## 2024-06-05 ENCOUNTER — Other Ambulatory Visit: Payer: Self-pay | Admitting: Family Medicine

## 2024-06-05 MED ORDER — ROSUVASTATIN CALCIUM 10 MG PO TABS: ORAL_TABLET | ORAL | 3 refills | Status: AC

## 2024-06-05 NOTE — Telephone Encounter (Signed)
 Copied from CRM #[id number]. Topic: Clinical - Medication Refill >> Jun 05, 2024  8:04 AM Zy'onna H wrote: Medication:  Rosuvastatin  Rosuvastatin  (CRESTOR ) 10 MG tablet   Has the patient contacted their pharmacy? Yes (Agent: If no, request that the patient contact the pharmacy for the refill. If patient does not wish to contact the pharmacy document the reason why and proceed with request.) (Agent: If yes, when and what did the pharmacy advise?)  This is the patient's preferred pharmacy:  [id number] - [place], [place] - [address] AT [address] OF [facility] [address]  [place] [address] Phone: [phone] Fax: [phone]  Is this the correct pharmacy for this prescription? Yes If no, delete pharmacy and type the correct one.   Has the prescription been filled recently? Yes  Is the patient out of the medication? Yes  Has the patient been seen for an appointment in the last year OR does the patient have an upcoming appointment? Yes  Can we respond through MyChart? Yes  Agent: Please be advised that Rx refills may take up to 3 business days. We ask that you follow-up with your pharmacy.

## 2024-06-06 ENCOUNTER — Telehealth: Admitting: Psychiatry

## 2024-07-05 NOTE — Progress Notes (Unsigned)
 Virtual Visit via Video Note  I connected with Kristin [name] on 07/09/24 at  2:30 PM EST by a video enabled telemedicine application and verified that I am speaking with the correct person using two identifiers.  Location: Patient: home Provider: home office Persons participated in the visit- patient, provider    I discussed the limitations of evaluation and management by telemedicine and the availability of in person appointments. The patient expressed understanding and agreed to proceed.    I discussed the assessment and treatment plan with the patient. The patient was provided an opportunity to ask questions and all were answered. The patient agreed with the plan and demonstrated an understanding of the instructions.   The patient was advised to call back or seek an in-person evaluation if the symptoms worsen or if the condition fails to improve as anticipated.   Kristin Sleet, MD     [facility] MD/PA/NP OP Progress Note  07/09/2024 3:05 PM Kristin [name]  MRN:  [id number]  Chief Complaint:  Chief Complaint  Patient presents with   Follow-up   HPI:  This is a follow-up appointment for depression and anxiety.  She states that she has gastroparesis.  She felt throat closing up after eating meals and had belching.  It happened when she ate ice cream.  She is unsure what to do about this.  She could not see a nutritionist as the insurance did not cover for it.  She feels guilty as she could not get breeze out or swallow.  Her mood has been good otherwise.  She enjoyed going on a cruise with her husband.  She was able to walk better.  Although she does not walk outside every day, she expressed understanding to try this whenever she can.  She tends to check things, which she attributes to adverse reaction from pramipexole .  However, she finds this medication to be very helpful for Parkinson.  She does not think it is out of control, and she feels good about her mood.  She has middle insomnia.   She may wake up due to nocturia.  She denies feeling depressed.  She denies SI, HI, hallucinations.   Household: son with autistic trait (employed) Marital status:married Number of children:3   Visit Diagnosis:    ICD-10-CM   1. MDD (major depressive disorder), recurrent, in partial remission  F33.41     2. Anxiety disorder, unspecified type  F41.9       Past Psychiatric History: Please see initial evaluation for full details. I have reviewed the history. No updates at this time.     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   Hyperlipidemia    Hypertension    Infertility, female    Low back pain 10/17/2007   Qualifier: Diagnosis of  By: Mavis MD, Norleen BRAVO    Measles as a child   Obesity    Osteoarthritis    Parkinson disease (HCC)    Plantar fasciitis of left foot 06/19/2012   Preventative health care 12/19/2015   Rheumatoid arthritis (HCC) 01/08/2018   Rosacea 10/05/2014   Swallowing difficulty    Urinary frequency 02/25/2013   Visual floaters 05/11/2014    Past Surgical History:  Procedure Laterality Date   ABDOMINAL HYSTERECTOMY  2006   total   CARDIAC CATHETERIZATION  October 2021   esophageal     stretching   HERNIA REPAIR N/A 2022   hiatal hernia   JOINT REPLACEMENT  05 2010   laporoscopy     LEFT HEART CATH AND CORONARY ANGIOGRAPHY N/A 06/02/2019   Procedure: LEFT  HEART CATH AND CORONARY ANGIOGRAPHY;  Surgeon: Dann Candyce RAMAN, MD;  Location: [facility] INVASIVE CV LAB;  Service: Cardiovascular;  Laterality: N/A;   TONSILLECTOMY     TOTAL HIP ARTHROPLASTY Right 2010   UPPER GASTROINTESTINAL ENDOSCOPY     wisdom teeth extracted      Family Psychiatric History: Please see initial evaluation for full details. I have reviewed the history. No updates at this time.     Family History:  Family History  Problem Relation Age of Onset   Other Mother        arrythmia   Mental illness Mother        bipolar   Hyperlipidemia Mother    Thyroid  disease Mother    Depression Mother    Bipolar disorder Mother    Breast cancer Mother 57   Heart disease Father    Arthritis Father        rheumatoid   Hypertension Father    Hyperlipidemia Father    Depression Sister    Mental illness Sister        bipolar   Parkinson's disease Sister    Arthritis Sister    Arthritis Sister    Arthritis Sister    Breast cancer Maternal Aunt 35   Breast cancer Maternal Uncle        19s   Heart attack Paternal Uncle    Heart attack Paternal Grandfather    Breast cancer Cousin 64   Colon cancer Neg Hx    Esophageal cancer Neg Hx    Rectal cancer Neg Hx    Stomach cancer Neg Hx    Sleep apnea Neg Hx     Social History:  Social History   Socioeconomic History   Marital status: Married    Spouse name: Not on file   Number of children: 1   Years of education: Not on file   Highest education level: Master's degree (e.g., MA, MS, MEng, MEd, MSW, MBA)  Occupational History   Occupation: Retired International aid/development worker  Tobacco Use   Smoking status: Never   Smokeless tobacco: Never  Vaping Use   Vaping status: Never Used  Substance and Sexual Activity   Alcohol use: Yes    Alcohol/week: 1.0 standard drink of alcohol    Types: 1 Glasses of wine per week    Comment: occasional wine   Drug use: No   Sexual activity: Not Currently    Partners: Male  Other Topics Concern    Not on file  Social History Narrative   Lives with husband, 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 3 grandsons- 2  in [place] and 47 year old in [place] in 2023      Hobbies: book club, time with friends, time with couples friends, plays pianos   Social Drivers of Corporate Investment Banker Strain: Low Risk  (03/10/2024)   Overall Financial Resource Strain (CARDIA)    Difficulty of Paying Living Expenses: Not hard at all  Food Insecurity: No Food Insecurity (03/10/2024)   Hunger Vital Sign    Worried About Running Out of Food in the Last Year: Never true    Ran Out of Food in the Last Year: Never true  Transportation Needs: No Transportation Needs (03/10/2024)   PRAPARE - Administrator, Civil Service (Medical): No    Lack of Transportation (Non-Medical): No  Physical Activity: Sufficiently Active (03/10/2024)   Exercise Vital Sign    Days of Exercise per Week: 5 days    Minutes of Exercise per Session: 60 min  Stress: No Stress Concern Present (03/10/2024)   [employer] of Occupational Health - Occupational Stress Questionnaire    Feeling of Stress: Only a little  Social Connections: Socially Integrated (03/10/2024)   Social Connection and Isolation Panel    Frequency of Communication with Friends and Family: More than three times a week    Frequency of Social Gatherings with Friends and Family: Three times a week    Attends Religious Services: 1 to 4 times per year    Active Member of Clubs or Organizations: Yes    Attends Banker Meetings: More than 4 times per year    Marital Status: Married    Allergies:  Allergies  Allergen Reactions   Scopolamine Other (See Comments)    Panic hallu  Other Reaction(s): Hallucination, Not available   Prednisone  Dermatitis    Facial ulcers  Other Reaction(s): facial swelling   Bactrim [Sulfamethoxazole-Trimethoprim]  Other (See Comments)    Oral ulcers and rash   Penicillins Hives and Other (See Comments)    Did it involve swelling of the face/tongue/throat, SOB, or low BP? No  Did it involve sudden or severe rash/hives, skin peeling, or any reaction on the inside of your mouth or nose? No  Did you need to seek medical attention at a hospital or doctor's office? No  When did it last happen?      30+ years ago  If all above answers are "NO", may proceed with cephalosporin use.   Sulfa Antibiotics     Other Reaction(s): Not available    Metabolic Disorder Labs: Lab Results  Component Value Date   HGBA1C 6.2 01/03/2024   MPG 126 (H) 12/29/2013   No results found for: PROLACTIN Lab Results  Component Value Date   CHOL 133 01/03/2024   TRIG 88.0 01/03/2024   HDL 54.20 01/03/2024   CHOLHDL 2 01/03/2024   VLDL 17.6 01/03/2024   LDLCALC 61 01/03/2024   LDLCALC 60 03/28/2023   Lab Results  Component Value Date   TSH 1.10 01/12/2022   TSH 1.20 10/03/2021    Therapeutic Level Labs: No results found for: LITHIUM No results found for: VALPROATE No results found for: CBMZ  Current Medications: Current Outpatient Medications  Medication Sig Dispense Refill   carbidopa -levodopa  (SINEMET  CR) 50-200 MG tablet TAKE 1 TABLET BY MOUTH EVERYDAY AT BEDTIME 90 tablet 0   carbidopa -levodopa  (SINEMET  IR) 25-100 MG tablet TAKE 2 TABLETS BY MOUTH AT 7AM, 2 TABLETS AT 11AM, AND 1 TABLET AT 4PM 450 tablet 0   carvedilol  (COREG ) 3.125 MG tablet  TAKE 1 TABLET BY MOUTH TWICE A DAY WITH FOOD 180 tablet 3   [START ON 08/18/2024] DULoxetine  (CYMBALTA ) 20 MG capsule Take 1 capsule (20 mg total) by mouth daily. 90 capsule 0   entacapone  (COMTAN ) 200 MG tablet Take 1 tablet (200 mg total) by mouth 3 (three) times daily. 270 tablet 2   escitalopram  (LEXAPRO ) 20 MG tablet TAKE 1 TABLET BY MOUTH EVERY DAY 90 tablet 3   omeprazole  (PRILOSEC) 20 MG capsule TAKE 1 CAPSULE BY MOUTH 2 TIMES DAILY AS NEEDED. 180  capsule 1   polyethylene glycol (MIRALAX  / GLYCOLAX ) 17 g packet Take 17 g by mouth daily as needed for mild constipation.     pramipexole  (MIRAPEX ) 0.5 MG tablet Take 1 tablet (0.5 mg total) by mouth 3 (three) times daily. 270 tablet 2   rosuvastatin  (CRESTOR ) 10 MG tablet TAKE 1 TABLET BY MOUTH ON MONDAYS,WEDNESDAYS, AND FRIDAYS 39 tablet 3   solifenacin (VESICARE) 5 MG tablet Take 5 mg by mouth daily.     Vitamin D , Ergocalciferol , (DRISDOL ) 1.25 MG (50000 UNIT) CAPS capsule TAKE 1 CAPSULE (50,000 UNITS TOTAL) BY MOUTH EVERY 7 (SEVEN) DAYS 12 capsule 1   No current facility-administered medications for this visit.     Musculoskeletal: Strength & Muscle Tone: N/A Gait & Station: N/A Patient leans: N/A  Psychiatric Specialty Exam: Review of Systems  Psychiatric/Behavioral:  Positive for sleep disturbance. Negative for agitation, behavioral problems, confusion, decreased concentration, dysphoric mood, hallucinations, self-injury and suicidal ideas. The patient is nervous/anxious. The patient is not hyperactive.   All other systems reviewed and are negative.   There were no vitals taken for this visit.There is no height or weight on file to calculate BMI.  General Appearance: Well Groomed  Eye Contact:  Good  Speech:  Clear and Coherent  Volume:  Normal  Mood:  good  Affect:  Appropriate, Congruent, and Full Range  Thought Process:  Coherent  Orientation:  Full (Time, Place, and Person)  Thought Content: Logical   Suicidal Thoughts:  No  Homicidal Thoughts:  No  Memory:  Immediate;   Good  Judgement:  Good  Insight:  Good  Psychomotor Activity:  Normal  Concentration:  Concentration: Good and Attention Span: Good  Recall:  Good  Fund of Knowledge: Good  Language: Good  Akathisia:  No  Handed:  Right  AIMS (if indicated): not done  Assets:  Communication Skills Desire for Improvement  ADL's:  Intact  Cognition: WNL  Sleep:  Fair   Screenings: GAD-7    Interior And Spatial Designer Visit from 05/13/2024 in [facility] from 05/09/2023 in [facility] from 03/28/2023 in [facility] from 01/23/2023 in [facility]  Total GAD-7 Score 0 5 6 0   Mini-Mental    Flowsheet Row Office Visit from 10/15/2017 in [facility] at [facility]  Total Score (max 30 points ) 30   PHQ2-9    Flowsheet Row Office Visit from 05/13/2024 in [facility] Clinical Support from 03/13/2024 in [facility] from 11/06/2023 in [facility] [employer] [facility] from 05/09/2023 in [facility] from 03/28/2023 in [facility]  [place]  PHQ-2 Total Score 0 0 0 0 4  PHQ-9 Total Score 3 0 8 9 15    Flowsheet Row ED from 10/10/2023 in [facility] Emergency Department at [facility] ED from 03/22/2023 in [facility] Emergency Department at [facility] ED from 12/10/2020 in [facility] Emergency Department at [facility]  C-SSRS RISK CATEGORY No Risk No Risk No Risk     Assessment and Plan:  Kristin [name] is a 73 y.o. year old female with a history of depression, anxiety, parkinson, seronegative, Nonerosive RA in remission,  hypertension, vitamin B 12/D deficiency, gastroparesis, who presents for the follow-up appointment for below.   1. MDD (major depressive disorder), recurrent, in partial remission 2. Anxiety disorder, unspecified type The patient reports dizziness, xerostomia, indigestion, Parkinsonian symptoms, and a fear of falling. Her mother has a history of bipolar disorder. Psychologically, she demonstrates a longstanding pattern of people-pleasing behaviors and struggles with setting  boundaries. Socially, she has adopted her daughter. Developmentally, she grew up with a very strict father who tended to yell. As the oldest of her five siblings, she took on a caregiving role for her younger siblings  History: suffers from depression for many years, Originally on duloxetine  30 mg daily, lexapro  20 mg daily. Relapse in mood symptoms when tapering off lexapro    Although she reports anxiety related to gastroparesis, her mood has been overall stable and she denies significant depressive symptoms since the previous visit.  She enjoyed a cruise trip and was able to walk every day.  Although the hope was to discontinue duloxetine , it is likely not the best timing due to her active GI symptoms.  Will maintain current medication regimen of both duloxetine  and Lexapro  to target depression and anxiety.  Previously discussed the risk of serotonin syndrome, QTc prolongation.   # gastroparesis She had a few episodes of dysphagia, and choking sensation. She agrees to discuss this with her gastroenterologist.    3. Chronic fatigue 4. Screening for iron  deficiency anemia Unchanged. She continues to experience fatigue, which could be multifactorial.  Given she is at risk of iron  deficiency given limited p.o. intake, gastroparesis, she was previously advised to obtain lab to rule out iron  deficiency.    Plan Continue duloxetine  20 mg daily - reduced 07/2023 Continue lexapro  20 mg daily (EKG HR 72, QTc 448 msec, 01/2024) Obtain labs- ferritin at [facility] Next appointment: 10/3 at 10 30, video She sees a therapist, Tobias Gruber-  [facility], every two weeks   The patient demonstrates the following risk factors for suicide: Chronic risk factors for suicide include: psychiatric disorder of depression, anxiety . Acute risk factors for suicide include: loss (financial, interpersonal, professional). Protective factors for this patient include: positive social support, responsibility to others  (children, family), coping skills, and hope for the future. Considering these factors, the overall suicide risk at this point appears to be low. Patient is appropriate for outpatient follow up.   Collaboration of Care: Collaboration of Care: Other reviewed notes in Epic  Patient/Guardian was advised Release of Information must be obtained prior to any record release in order to collaborate their care with an outside provider. Patient/Guardian was advised if they have not already done so to contact the registration department to sign all necessary forms in order for us  to release information regarding their care.   Consent: Patient/Guardian gives verbal consent for treatment and assignment of benefits for services provided during this visit. Patient/Guardian expressed understanding and agreed to proceed.  Kristin Sleet, MD 07/09/2024, 3:05 PM

## 2024-07-09 ENCOUNTER — Encounter: Payer: Self-pay | Admitting: Psychiatry

## 2024-07-09 ENCOUNTER — Telehealth (INDEPENDENT_AMBULATORY_CARE_PROVIDER_SITE_OTHER): Admitting: Psychiatry

## 2024-07-09 DIAGNOSIS — F419 Anxiety disorder, unspecified: Secondary | ICD-10-CM | POA: Diagnosis not present

## 2024-07-09 DIAGNOSIS — F3341 Major depressive disorder, recurrent, in partial remission: Secondary | ICD-10-CM

## 2024-07-09 MED ORDER — DULOXETINE HCL 20 MG PO CPEP: 20.0000 mg | ORAL_CAPSULE | Freq: Every day | ORAL | 0 refills | Status: AC

## 2024-07-11 ENCOUNTER — Other Ambulatory Visit: Payer: Self-pay | Admitting: Family Medicine

## 2024-07-11 MED ORDER — CARVEDILOL 3.125 MG PO TABS: 3.1250 mg | ORAL_TABLET | Freq: Two times a day (BID) | ORAL | 3 refills | Status: AC

## 2024-07-11 NOTE — Telephone Encounter (Signed)
 Copied from CRM [id number]. Topic: Clinical - Medication Refill >> Jul 11, 2024  8:51 AM Avram MATSU wrote: Medication: carvedilol  (COREG ) 3.125 MG tablet  Has the patient contacted their pharmacy? Yes (Agent: If no, request that the patient contact the pharmacy for the refill. If patient does not wish to contact the pharmacy document the reason why and proceed with request.) (Agent: If yes, when and what did the pharmacy advise?)  This is the patient's preferred pharmacy:  [id number] - [place], [place] - [address] AT [address] OF [facility] [address] [place] [address] Phone: [phone] Fax: [phone]  Is this the correct pharmacy for this prescription? Yes If no, delete pharmacy and type the correct one.   Has the prescription been filled recently? No  Is the patient out of the medication? No 3 days left  Has the patient been seen for an appointment in the last year OR does the patient have an upcoming appointment? Yes  Can we respond through MyChart? Yes  Agent: Please be advised that Rx refills may take up to 3 business days. We ask that you follow-up with your pharmacy.

## 2024-07-30 ENCOUNTER — Other Ambulatory Visit: Payer: Self-pay | Admitting: Family Medicine

## 2024-09-07 NOTE — Progress Notes (Signed)
 Virtual Visit via Video Note  I connected with Kristin [name] on 09/12/2024 at 11:00 AM EST by a video enabled telemedicine application and verified that I am speaking with the correct person using two identifiers.  Location: Patient: home Provider: home office Persons participated in the visit- patient, provider    I discussed the limitations of evaluation and management by telemedicine and the availability of in person appointments. The patient expressed understanding and agreed to proceed.    I discussed the assessment and treatment plan with the patient. The patient was provided an opportunity to ask questions and all were answered. The patient agreed with the plan and demonstrated an understanding of the instructions.   The patient was advised to call back or seek an in-person evaluation if the symptoms worsen or if the condition fails to improve as anticipated.   [name], MD    [facility] MD/PA/NP OP Progress Note  09/12/2024 11:34 AM Kristin [name]  MRN:  [id number]  Chief Complaint:  Chief Complaint  Patient presents with   Follow-up   HPI:  This is a follow-up appointment for depression and anxiety.  She states that she has good days and bad days.  She wakes up not feeling good at times with dizziness.  She tends to feel little down on those days.  She has not had any fall, but slow at times.  She is hoping to join dancing class.  She tends to feel anxious when she meets in group.  She has not been able to walk outside due to anxiety of walking and even surface.  However,, she is trying to proceed with this.  She feels a little tired on some days.  She has occasional insomnia. She has good appetite, and was able to see her ST. she denies much anxiety about choking sensation anymore.  She has been trying to read books when she is at home.  She has been working with a therapist about issues related to adoption.  She denies SI, hallucinations.  She reports preference to stay on the  current medication regimen as she has been doing good.  She agrees with the plans as outlined below.   Household: son with autistic trait (employed) Marital status:married Number of children:3   Visit Diagnosis:    ICD-10-CM   1. MDD (major depressive disorder), recurrent, in partial remission  F33.41     2. Anxiety disorder, unspecified type  F41.9       Past Psychiatric History: Please see initial evaluation for full details. I have reviewed the history. No updates at this time.     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   Hyperlipidemia    Hypertension    Infertility, female    Low back pain 10/17/2007  Qualifier: Diagnosis of  By: Mavis MD, Norleen BRAVO    Measles as a child   Obesity    Osteoarthritis    Parkinson disease (HCC)    Plantar fasciitis of left foot 06/19/2012   Preventative health care 12/19/2015   Rheumatoid arthritis (HCC) 01/08/2018   Rosacea 10/05/2014   Swallowing difficulty    Urinary frequency 02/25/2013   Visual floaters 05/11/2014    Past Surgical History:  Procedure Laterality Date   ABDOMINAL HYSTERECTOMY  2006   total   CARDIAC CATHETERIZATION  October 2021   esophageal     stretching   HERNIA REPAIR N/A 2022   hiatal hernia   JOINT REPLACEMENT  05 2010   laporoscopy     LEFT HEART CATH AND CORONARY ANGIOGRAPHY N/A 06/02/2019   Procedure: LEFT  HEART CATH AND CORONARY ANGIOGRAPHY;  Surgeon: Dann Candyce RAMAN, MD;  Location: [facility] INVASIVE CV LAB;  Service: Cardiovascular;  Laterality: N/A;   TONSILLECTOMY     TOTAL HIP ARTHROPLASTY Right 2010   UPPER GASTROINTESTINAL ENDOSCOPY     wisdom teeth extracted      Family Psychiatric History: Please see initial evaluation for full details. I have reviewed the history. No updates at this time.    Family History:  Family History  Problem Relation Age of Onset   Other Mother        arrythmia   Mental illness Mother        bipolar   Hyperlipidemia Mother    Thyroid  disease Mother    Depression Mother    Bipolar disorder Mother    Breast cancer Mother 51   Heart disease Father    Arthritis Father        rheumatoid   Hypertension Father    Hyperlipidemia Father    Depression Sister    Mental illness Sister        bipolar   Parkinson's disease Sister    Arthritis Sister    Arthritis Sister    Arthritis Sister    Breast cancer Maternal Aunt 35   Breast cancer Maternal Uncle        61s   Heart attack Paternal Uncle    Heart attack Paternal Grandfather    Breast cancer Cousin 40   Colon cancer Neg Hx    Esophageal cancer Neg Hx    Rectal cancer Neg Hx    Stomach cancer Neg Hx    Sleep apnea Neg Hx     Social History:  Social History   Socioeconomic History   Marital status: Married    Spouse name: Not on file   Number of children: 1   Years of education: Not on file   Highest education level: Master's degree (e.g., MA, MS, MEng, MEd, MSW, MBA)  Occupational History   Occupation: Retired International aid/development worker  Tobacco Use   Smoking status: Never   Smokeless tobacco: Never  Vaping Use   Vaping status: Never Used  Substance and Sexual Activity   Alcohol use: Yes    Alcohol/week: 1.0 standard drink of alcohol    Types: 1 Glasses of wine per week    Comment: occasional wine   Drug use: No   Sexual activity: Not Currently    Partners: Male  Other Topics Concern    Not on file  Social History Narrative   Lives with husband, 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  3 grandsons- 2 in [place] and 50 year old in [place] in 2023      Hobbies: book club, time with friends, time with couples friends, plays pianos   Social Drivers of Health   Tobacco Use: Low Risk (07/09/2024)   Patient History    Smoking Tobacco Use: Never    Smokeless Tobacco Use: Never    Passive Exposure: Not on file  Financial Resource Strain: Low Risk (03/10/2024)   Overall Financial Resource Strain (CARDIA)    Difficulty of Paying Living Expenses: Not hard at all  Food Insecurity: No Food Insecurity (03/10/2024)   Epic    Worried About Radiation Protection Practitioner of Food in the Last Year: Never true    Ran Out of Food in the Last Year: Never true  Transportation Needs: No Transportation Needs (03/10/2024)   Epic    Lack of Transportation (Medical): No    Lack of Transportation (Non-Medical): No  Physical Activity: Sufficiently Active (03/10/2024)   Exercise Vital Sign    Days of Exercise per Week: 5 days    Minutes of Exercise per Session: 60 min  Stress: No Stress Concern Present (03/10/2024)   [employer] of Occupational Health - Occupational Stress Questionnaire    Feeling of Stress: Only a little  Social Connections: Socially Integrated (03/10/2024)   Social Connection and Isolation Panel    Frequency of Communication with Friends and Family: More than three times a week    Frequency of Social Gatherings with Friends and Family: Three times a week    Attends Religious Services: 1 to 4 times per year    Active Member of Clubs or Organizations: Yes    Attends Banker Meetings: More than 4 times per year    Marital Status: Married  Depression (PHQ2-9): Low Risk (05/13/2024)   Depression (PHQ2-9)    PHQ-2 Score: 3  Alcohol Screen: Low Risk (03/10/2024)   Alcohol Screen    Last Alcohol  Screening Score (AUDIT): 2  Housing: Low Risk (03/10/2024)   Epic    Unable to Pay for Housing in the Last Year: No    Number of Times Moved in the Last Year: 0    Homeless in the Last Year: No  Utilities: Not At Risk (03/13/2024)   Epic    Threatened with loss of utilities: No  Health Literacy: Adequate Health Literacy (03/13/2024)   B1300 Health Literacy    Frequency of need for help with medical instructions: Never    Allergies: Allergies[1]  Metabolic Disorder Labs: Lab Results  Component Value Date   HGBA1C 6.2 01/03/2024   MPG 126 (H) 12/29/2013   No results found for: PROLACTIN Lab Results  Component Value Date   CHOL 133 01/03/2024   TRIG 88.0 01/03/2024   HDL 54.20 01/03/2024   CHOLHDL 2 01/03/2024   VLDL 17.6 01/03/2024   LDLCALC 61 01/03/2024   LDLCALC 60 03/28/2023   Lab Results  Component Value Date   TSH 1.10 01/12/2022   TSH 1.20 10/03/2021    Therapeutic Level Labs: No results found for: LITHIUM No results found for: VALPROATE No results found for: CBMZ  Current Medications: Current Outpatient Medications  Medication Sig Dispense Refill   carbidopa -levodopa  (SINEMET  CR) 50-200 MG tablet TAKE 1 TABLET BY MOUTH EVERYDAY AT BEDTIME 90 tablet 0   carbidopa -levodopa  (SINEMET  IR) 25-100 MG tablet TAKE 2 TABLETS BY MOUTH AT 7AM, 2 TABLETS AT 11AM, AND 1 TABLET AT 4PM 450 tablet 0   carvedilol  (COREG ) 3.125 MG tablet  Take 1 tablet (3.125 mg total) by mouth 2 (two) times daily with a meal. 180 tablet 3   DULoxetine  (CYMBALTA ) 20 MG capsule Take 1 capsule (20 mg total) by mouth daily. 90 capsule 0   entacapone  (COMTAN ) 200 MG tablet Take 1 tablet (200 mg total) by mouth 3 (three) times daily. 270 tablet 2   escitalopram  (LEXAPRO ) 20 MG tablet TAKE 1 TABLET BY MOUTH EVERY DAY 90 tablet 3   omeprazole  (PRILOSEC) 20 MG capsule TAKE 1 CAPSULE BY MOUTH 2 TIMES DAILY AS NEEDED. 180 capsule 1   polyethylene glycol (MIRALAX  / GLYCOLAX ) 17 g packet Take 17 g by  mouth daily as needed for mild constipation.     pramipexole  (MIRAPEX ) 0.5 MG tablet Take 1 tablet (0.5 mg total) by mouth 3 (three) times daily. 270 tablet 2   rosuvastatin  (CRESTOR ) 10 MG tablet TAKE 1 TABLET BY MOUTH ON MONDAYS,WEDNESDAYS, AND FRIDAYS 39 tablet 3   solifenacin (VESICARE) 5 MG tablet Take 5 mg by mouth daily.     Vitamin D , Ergocalciferol , (DRISDOL ) 1.25 MG (50000 UNIT) CAPS capsule TAKE 1 CAPSULE (50,000 UNITS TOTAL) BY MOUTH EVERY 7 (SEVEN) DAYS 12 capsule 1   No current facility-administered medications for this visit.     Musculoskeletal: Strength & Muscle Tone: N/A Gait & Station: N/A Patient leans: N/A  Psychiatric Specialty Exam: Review of Systems  Psychiatric/Behavioral:  Negative for agitation, behavioral problems, confusion, decreased concentration, dysphoric mood, hallucinations, self-injury, sleep disturbance and suicidal ideas. The patient is nervous/anxious. The patient is not hyperactive.   All other systems reviewed and are negative.   There were no vitals taken for this visit.There is no height or weight on file to calculate BMI.  General Appearance: Well Groomed  Eye Contact:  Good  Speech:  Clear and Coherent  Volume:  Normal  Mood:  Anxious  Affect:  Appropriate, Congruent, and calm  Thought Process:  Coherent  Orientation:  Full (Time, Place, and Person)  Thought Content: Logical   Suicidal Thoughts:  No  Homicidal Thoughts:  No  Memory:  Immediate;   Good  Judgement:  Good  Insight:  Good  Psychomotor Activity:  Normal  Concentration:  Concentration: Good and Attention Span: Good  Recall:  Good  Fund of Knowledge: Good  Language: Good  Akathisia:  No  Handed:  Right  AIMS (if indicated): not done  Assets:  Communication Skills Desire for Improvement  ADL's:  Intact  Cognition: WNL  Sleep:  Good   Screenings: GAD-7    Flowsheet Row Office Visit from 05/13/2024 in [facility]  from 05/09/2023 in [facility] from 03/28/2023 in [facility] from 01/23/2023 in [facility]  Total GAD-7 Score 0 5 6 0   Mini-Mental    Flowsheet Row Office Visit from 10/15/2017 in [facility] at [facility]  Total Score (max 30 points ) 30   PHQ2-9    Flowsheet Row Office Visit from 05/13/2024 in [facility] Clinical Support from 03/13/2024 in [facility] from 11/06/2023 in [facility] from 05[facility]09/2023 in [facility] from 03/28/2023 in [facility]  PHQ-2 Total Score 0 0 0 0 4  PHQ-9 Total Score 3 0 8 9 15    Flowsheet Row ED from 10/10/2023 in [facility] at [facility] ED from 03/22/2023 in [facility] Emergency Department at [facility] ED from 12/10/2020 in [facility] Emergency Department at [facility]  C-SSRS RISK CATEGORY No Risk No Risk No Risk     Assessment and Plan:  Kristin [name] is a 74 y.o. year old female with a history of depression, anxiety, parkinson, seronegative, Nonerosive RA in remission,  hypertension, vitamin B 12/D deficiency, gastroparesis, who presents for the follow-up appointment for below.   1. MDD (major depressive disorder), recurrent, in partial remission 2. Anxiety disorder, unspecified type The patient reports dizziness, xerostomia, indigestion, Parkinsonian symptoms, and a fear of falling. Her mother has a history of bipolar disorder. Psychologically, she demonstrates a longstanding pattern of people-pleasing behaviors and struggles with setting boundaries. Socially, she has adopted her daughter. Developmentally, she grew up with a very  strict father who tended to yell. As the oldest of her five siblings, she took on a care giving role for her younger siblings  History: suffers from depression for many years, Originally on duloxetine  30 mg daily, lexapro  20 mg daily. Relapse in mood symptoms when tapering off lexapro     Although she reports anxiety related to meeting in groups, and has anticipatory anxiety related to walking outside, her mood has been overall stable otherwise. She feels more comfortable with her eating and is exploring a variety of activities to strengthen her balance.  Will continue current medication regimen.  Will continue duloxetine  and Lexapro  to target depression and anxiety.  We have discussed the risk of serotonin syndrome and QTc prolongation.  Although it is preferable to do monotherapy, will prioritize enhancing her activities as the current regimen appears to be beneficial without any adverse reaction.    3. Chronic fatigue 4. Screening for iron  deficiency anemia Unchanged. She continues to experience fatigue, which could be multifactorial.  Given she is at risk of iron  deficiency given limited p.o. intake, gastroparesis, she was previously advised to obtain lab to rule out iron  deficiency.    Plan Continue duloxetine  20 mg daily - reduced 07/2023 Continue lexapro  20 mg daily (EKG HR 72, QTc 448 msec, 01/2024) Obtain labs- ferritin at [facility] Next appointment: 3/6 at 10 am, video She sees a therapist, Tobias Gruber-  [facility], every two weeks   The patient demonstrates the following risk factors for suicide: Chronic risk factors for suicide include: psychiatric disorder of depression, anxiety . Acute risk factors for suicide include: loss (financial, interpersonal, professional). Protective factors for this patient include: positive social support, responsibility to others (children, family), coping skills, and hope for the future. Considering these factors, the overall suicide risk at  this point appears to be low. Patient is appropriate for outpatient follow up.   Collaboration of Care: Collaboration of Care: Other reviewed notes in Epic  Patient/Guardian was advised Release of Information must be obtained prior to any record release in order to collaborate their care with an outside provider. Patient/Guardian was advised if they have not already done so to contact the registration department to sign all necessary forms in order for us  to release information regarding their care.   Consent: Patient/Guardian gives verbal consent for treatment and assignment of benefits for services provided during this visit. Patient/Guardian expressed understanding and agreed to proceed.    [name], MD 09/12/2024, 11:34 AM     [  1]  Allergies Allergen Reactions   Scopolamine Other (See Comments)    Panic hallu  Other Reaction(s): Hallucination, Not available   Prednisone  Dermatitis    Facial ulcers  Other Reaction(s): facial swelling   Bactrim [Sulfamethoxazole-Trimethoprim] Other (See Comments)    Oral ulcers and rash   Penicillins Hives and Other (See Comments)    Did it involve swelling of the face/tongue/throat, SOB, or low BP? No  Did it involve sudden or severe rash/hives, skin peeling, or any reaction on the inside of your mouth or nose? No  Did you need to seek medical attention at a hospital or doctor's office? No  When did it last happen?      30+ years ago  If all above answers are NO, may proceed with cephalosporin use.   Sulfa Antibiotics     Other Reaction(s): Not available

## 2024-09-11 ENCOUNTER — Ambulatory Visit: Admitting: Physical Therapy

## 2024-09-11 ENCOUNTER — Ambulatory Visit: Attending: Neurology | Admitting: Occupational Therapy

## 2024-09-11 ENCOUNTER — Ambulatory Visit

## 2024-09-11 DIAGNOSIS — R2689 Other abnormalities of gait and mobility: Secondary | ICD-10-CM

## 2024-09-11 DIAGNOSIS — R29818 Other symptoms and signs involving the nervous system: Secondary | ICD-10-CM | POA: Insufficient documentation

## 2024-09-11 DIAGNOSIS — R471 Dysarthria and anarthria: Secondary | ICD-10-CM | POA: Insufficient documentation

## 2024-09-11 NOTE — Therapy (Signed)
 [facility] [address] [place], [place], [address] Phone: [phone]   Fax:  [phone]  Patient Details  Name: Kristin [name] MRN: [id number] Date of Birth: May 12, 1951 Referring Provider:  Katrinka Garnette KIDD, MD  Encounter Date: 09/11/2024  Occupational Therapy Parkinson's Disease Screen  Hand dominance:  Left   Handwriting: PPT #1 ([place]): 14.15 sec.  Good sizing and spacing, 100% legibility - pt does recognize some increased messiness with individual words.     Physical Performance Test item #2 (simulated eating):  12.88 sec  9-hole peg test:    RUE  23.09 sec        LUE  19.85 sec  Box & Blocks Test:   RUE  63 blocks (hit barrier x2)       LUE  60 blocks  Other Comments:  Pt reports still utilizing strategies from previous OT POC to aid in ease and independence with self-feeding and medication management.  Pt does not require occupational therapy services at this time.  Recommended occupational therapy screen in   6-9 months   Jazper Nikolai, OTR/L 09/11/2024, 12:23 PM  [facility] [address] [place], [address] [place], [place] [address] Phone # [phone] Fax # [phone]  [facility] [phone] [place], [place], [address] Phone: [phone]   Fax:  [phone]

## 2024-09-11 NOTE — Therapy (Signed)
 [place] [address] [place], [place], [address] Phone: [phone]   Fax:  [phone]  Patient Details  Name: TAYLI BUCH MRN: [id number] Date of Birth: [birth year] Referring Provider:  Katrinka Garnette KIDD, MD  Encounter Date: 09/11/2024  Physical Therapy Parkinson's Disease Screen   Timed Up and Go test:12.72 sec (compared to 12.79 sec)  10 meter walk test:12.72 sec = 2.57 ft/sec (compared to 2.62 ft/sec)  5 time sit to stand test:11.63 sec (compared to 11.56 sec)  Patient does not require Physical Therapy services at this time.  Recommend Physical Therapy screen in 6-9 months.  Went on a cruise this fall, and it went well.  Biggest problem right now is sleeping.  No falls. (Exercise at [facility] for weights 2x/week, stretching class Fridays, PWR! Moves Wednesdays)   Kaceton Vieau W., PT 09/11/2024, 12:05 PM  [facility] [address] [place], [place], [address] Phone: [phone]   Fax:  [phone]

## 2024-09-11 NOTE — Therapy (Signed)
 [place] [facility] [address] [place], [place], [address] Phone: [phone]   Fax:  [phone]  Patient Details  Name: Kristin [name] MRN: [id number] Date of Birth: August 04, 1951 Referring Provider: Evonnie Stabs, DO  Encounter Date: 09/11/2024  Speech Therapy Parkinson's Disease Screen   Decibel Level today: 71dB  (WNL=70-72 dB) with sound level meter 30cm away from pt's mouth. Pt's conversational volume has remained essentially the same since last treatment course  Pt does not report difficulty with swallowing, which does not warrant further evaluation  Pt does endorse changes in cognition. Pt reports she begins to speak at times and will forget why she began talking - this suggests a difficulty with attention.   Pt would benefit from cognitive communication evaluation - please order via EPIC    Isaih Bulger, CCC-SLP 09/11/2024, 11:16 AM  [place] [facility] [address] [place], [place], [address] Phone: [phone]   Fax:  [phone]

## 2024-09-12 ENCOUNTER — Encounter: Payer: Self-pay | Admitting: Psychiatry

## 2024-09-12 ENCOUNTER — Telehealth: Admitting: Psychiatry

## 2024-09-12 DIAGNOSIS — F419 Anxiety disorder, unspecified: Secondary | ICD-10-CM | POA: Diagnosis not present

## 2024-09-12 DIAGNOSIS — F3341 Major depressive disorder, recurrent, in partial remission: Secondary | ICD-10-CM

## 2024-09-12 NOTE — Patient Instructions (Signed)
 Continue duloxetine  20 mg daily  Continue lexapro  20 mg daily Obtain labs- ferritin at [facility] Next appointment: 3/6 at 10 am

## 2024-09-22 ENCOUNTER — Other Ambulatory Visit: Payer: Self-pay | Admitting: Neurology

## 2024-09-22 DIAGNOSIS — G20A1 Parkinson's disease without dyskinesia, without mention of fluctuations: Secondary | ICD-10-CM

## 2024-09-23 NOTE — Progress Notes (Unsigned)
 "   Assessment/Plan:   1.  Parkinsons Disease             -Continue pramipexole  0.5 mg 3 times per day.  Higher dosages with SE and still with minimal compulsive SE (cleaning house)             -Continue carbidopa /levodopa  25/100, 2/2/1 (can chew half to 1 in the middle of the night if awakens with tremor).               -She will continue Carbidopa /levodopa  50/200 at night.    -continue entacapone , 200 mg with each dose of levodopa .  She thought it was making her dizzy, but we stopped it and it did not change the dizziness (but stopping losarten did).  -she is following with dermatology at [facility]  -no off time so no need for vyalev right now 2.  Depression and anxiety             - She is on a combination of Lexapro  and Cymbalta .  She is following with Dr. Vickey  -Patient is following with a counselor. 3.  Dysphagia             -last MBE May, 2021 with mild pharyngeal cervical esophageal deficits.    -following with GI  -stable right now 4.  Rheumatoid arthritis             -off of methotrexate.  Following with [facility] 5.  REM behavior disorder             -Understands safety associated with this.  Does not want more medications. 6.  Gastroparesis             -Following with Dr. Philippa.    -Failed Linzess  and trulance  7.  Excess sweating  -is long term but discussed with her that I don't want to add meds b/c clonidine is tx and she already has dizziness.    -cymbalta  may be contributing to the sweating but she tried to d/c and was crying all the time.  Dr. Hisada initially tried to taper down to monotherapy so that she was not on both Lexapro  and duloxetine , but ultimately patient needed both 9.  EDS  -Saw Dr. Buck in October, 2024 and nocturnal polysomnogram was negative.  - Unfortunately, post negative polysomnogram, she was given no further recommendations for the sleep issues. 10.  Urinary incontinence  -has done pelvic floor PT with benefit at day but  struggles at night still  -following with [facility] and considering PTNS  -on vesicare.  Discussed that will watch this with time if falls/confusion develops  11.  Neurogenic Orthostatic Hypotension  -improved with d/c of losarten.  Still on coreg  but sx's are resolved.    Subjective:   Kristin [name] was seen today in follow up for Parkinsons disease.  My previous records were reviewed prior to todays visit as well as outside records available to me.  I haven't changed that much.  She remains on immediate release levodopa  in the day, extended release at night along with pramipexole  and entacapone .  She has had no falls since last visit.  She is exercising.  She has had no syncopal episodes.  She saw Dr. Vickey via video visit on January 9.  Notes are reviewed.  Current prescribed movement disorder medications: Carbidopa /levodopa  25/100, 2 tablets in the morning, 2 in the afternoon and 1 in the evening (7am/then between 11am-noon/4pm) Entacapone , 200 mg, 1 tablet 3 times per  day with each dose of levodopa   Carbidopa /levodopa  50/200 at bedtime Pramipexole  0.5 mg 3 times per day   Prior meds: pramipexole  (higher dosages with mild compulsive behavior - cleaning/some shopping); entacapone  (stopped it because he was dizzy, but the dizziness persisted so he restarted it)   ALLERGIES:   Allergies  Allergen Reactions   Scopolamine Other (See Comments)    Panic hallu  Other Reaction(s): Hallucination, Not available   Prednisone  Dermatitis    Facial ulcers  Other Reaction(s): facial swelling   Bactrim [Sulfamethoxazole-Trimethoprim] Other (See Comments)    Oral ulcers and rash   Penicillins Hives and Other (See Comments)    Did it involve swelling of the face/tongue/throat, SOB, or low BP? No  Did it involve sudden or severe rash/hives, skin peeling, or any reaction on the inside of your mouth or nose? No  Did you need to seek medical attention at a hospital or doctor's office?  No  When did it last happen?      30+ years ago  If all above answers are NO, may proceed with cephalosporin use.   Sulfa Antibiotics     Other Reaction(s): Not available    CURRENT MEDICATIONS:  Outpatient Encounter Medications as of 09/25/2024  Medication Sig   carbidopa -levodopa  (SINEMET  CR) 50-200 MG tablet TAKE 1 TABLET BY MOUTH EVERYDAY AT BEDTIME   carbidopa -levodopa  (SINEMET  IR) 25-100 MG tablet TAKE 2 TABLETS BY MOUTH AT 7AM, 2 TABLETS AT 11AM, AND 1 TABLET AT 4PM   carvedilol  (COREG ) 3.125 MG tablet Take 1 tablet (3.125 mg total) by mouth 2 (two) times daily with a meal.   DULoxetine  (CYMBALTA ) 20 MG capsule Take 1 capsule (20 mg total) by mouth daily.   entacapone  (COMTAN ) 200 MG tablet Take 1 tablet (200 mg total) by mouth 3 (three) times daily.   escitalopram  (LEXAPRO ) 20 MG tablet TAKE 1 TABLET BY MOUTH EVERY DAY   omeprazole  (PRILOSEC) 20 MG capsule TAKE 1 CAPSULE BY MOUTH 2 TIMES DAILY AS NEEDED.   polyethylene glycol (MIRALAX  / GLYCOLAX ) 17 g packet Take 17 g by mouth daily as needed for mild constipation.   pramipexole  (MIRAPEX ) 0.5 MG tablet Take 1 tablet (0.5 mg total) by mouth 3 (three) times daily.   rosuvastatin  (CRESTOR ) 10 MG tablet TAKE 1 TABLET BY MOUTH ON MONDAYS,WEDNESDAYS, AND FRIDAYS   solifenacin (VESICARE) 5 MG tablet Take 5 mg by mouth daily.   Vitamin D , Ergocalciferol , (DRISDOL ) 1.25 MG (50000 UNIT) CAPS capsule TAKE 1 CAPSULE (50,000 UNITS TOTAL) BY MOUTH EVERY 7 (SEVEN) DAYS   No facility-administered encounter medications on file as of 09/25/2024.    Objective:   PHYSICAL EXAMINATION:    VITALS:   Vitals:   09/25/24 0745  BP: 138/72  Pulse: 67  SpO2: 95%  Weight: 230 lb 9.6 oz (104.6 kg)  Height: 5' 3 (1.6 m)     No data found.    GEN:  The patient appears stated age and is in NAD. HEENT:  Normocephalic, atraumatic.  The mucous membranes are moist.  CV:  RRR Lungs:  CTAB Neck:  no bruits  Neurological  examination:  Orientation: The patient is alert and oriented x3. Cranial nerves: There is good facial symmetry without significant hypomimia.  EOMI.   The speech is fluent and clear. Soft palate rises symmetrically and there is no tongue deviation. Hearing is intact to conversational tone. Sensation: Sensation is intact to light touch throughout Motor: Strength is at least antigravity x4.  Movement examination: Tone: There  is nl tone today in the UE/LE Abnormal movements:mild R foot dyskinesia (no tremor today) Coordination:  There is good RAMs today and no decremation with any form of RAMS, including alternating supination and pronation of the forearm, hand opening and closing, finger taps, heel taps and toe taps.  Gait and Station: The patient lightly pushes off. The patients gait is tenuous but ambulates well.  Good but purposeful arm swing today.  I have reviewed and interpreted the following labs independently    Chemistry      Component Value Date/Time   NA 140 01/03/2024 1125   NA 144 12/05/2019 0935   K 3.8 01/03/2024 1125   CL 105 01/03/2024 1125   CO2 27 01/03/2024 1125   BUN 16 01/03/2024 1125   BUN 13 12/05/2019 0935   CREATININE 0.66 01/03/2024 1125   CREATININE 0.71 01/04/2015 1046      Component Value Date/Time   CALCIUM  9.7 01/03/2024 1125   ALKPHOS 94 01/03/2024 1125   AST 24 01/03/2024 1125   ALT 12 01/03/2024 1125   BILITOT 0.7 01/03/2024 1125   BILITOT 0.4 11/27/2016 1123       Lab Results  Component Value Date   WBC 6.6 01/03/2024   HGB 12.8 01/03/2024   HCT 38.3 01/03/2024   MCV 89.9 01/03/2024   PLT 211.0 01/03/2024    Lab Results  Component Value Date   TSH 1.10 01/12/2022   Lab Results  Component Value Date   VITAMINB12 834 01/03/2024     Total time spent on today's visit was 31 minutes, including both face-to-face time and nonface-to-face time.  Time included that spent on review of records (prior notes available to me/labs/imaging if  pertinent), discussing treatment and goals, answering patient's questions and coordinating care.  Cc:  Katrinka Garnette KIDD, MD "

## 2024-09-25 ENCOUNTER — Ambulatory Visit (INDEPENDENT_AMBULATORY_CARE_PROVIDER_SITE_OTHER): Admitting: Neurology

## 2024-09-25 ENCOUNTER — Encounter: Payer: Self-pay | Admitting: Neurology

## 2024-09-25 VITALS — BP 138/72 | HR 67 | Ht 63.0 in | Wt 230.6 lb

## 2024-09-25 DIAGNOSIS — G20B1 Parkinson's disease with dyskinesia, without mention of fluctuations: Secondary | ICD-10-CM

## 2024-09-25 DIAGNOSIS — R351 Nocturia: Secondary | ICD-10-CM

## 2024-09-25 DIAGNOSIS — G20A1 Parkinson's disease without dyskinesia, without mention of fluctuations: Secondary | ICD-10-CM

## 2024-09-25 MED ORDER — CARBIDOPA-LEVODOPA 25-100 MG PO TABS: ORAL_TABLET | ORAL | 1 refills | Status: AC

## 2024-09-25 MED ORDER — PRAMIPEXOLE DIHYDROCHLORIDE 0.5 MG PO TABS: 0.5000 mg | ORAL_TABLET | Freq: Three times a day (TID) | ORAL | 1 refills | Status: AC

## 2024-09-25 MED ORDER — ENTACAPONE 200 MG PO TABS: 200.0000 mg | ORAL_TABLET | Freq: Three times a day (TID) | ORAL | 1 refills | Status: AC

## 2024-11-03 ENCOUNTER — Ambulatory Visit: Admitting: Family Medicine

## 2024-11-07 ENCOUNTER — Telehealth: Admitting: Psychiatry

## 2025-03-19 ENCOUNTER — Ambulatory Visit

## 2025-03-26 ENCOUNTER — Ambulatory Visit: Payer: Self-pay | Admitting: Neurology
